# Patient Record
Sex: Female | Born: 1955 | Race: White | Hispanic: No | Smoking: Current every day smoker
Health system: Southern US, Community
[De-identification: ages and names within clinical notes are randomized; demographics above are authoritative.]

## PROBLEM LIST (undated history)

## (undated) ENCOUNTER — Telehealth
Attending: Student in an Organized Health Care Education/Training Program | Primary: Student in an Organized Health Care Education/Training Program

## (undated) ENCOUNTER — Ambulatory Visit: Payer: MEDICARE

## (undated) ENCOUNTER — Encounter
Attending: Student in an Organized Health Care Education/Training Program | Primary: Student in an Organized Health Care Education/Training Program

## (undated) ENCOUNTER — Ambulatory Visit

## (undated) ENCOUNTER — Encounter

## (undated) ENCOUNTER — Encounter: Attending: Registered" | Primary: Registered"

## (undated) ENCOUNTER — Telehealth

## (undated) ENCOUNTER — Ambulatory Visit
Payer: MEDICARE | Attending: Student in an Organized Health Care Education/Training Program | Primary: Student in an Organized Health Care Education/Training Program

## (undated) ENCOUNTER — Encounter: Attending: Oncology | Primary: Oncology

## (undated) ENCOUNTER — Ambulatory Visit: Payer: MEDICARE | Attending: Adult Health | Primary: Adult Health

## (undated) DIAGNOSIS — I671 Cerebral aneurysm, nonruptured: Secondary | ICD-10-CM

## (undated) DIAGNOSIS — F319 Bipolar disorder, unspecified: Secondary | ICD-10-CM

## (undated) DIAGNOSIS — R569 Unspecified convulsions: Secondary | ICD-10-CM

## (undated) DIAGNOSIS — J449 Chronic obstructive pulmonary disease, unspecified: Secondary | ICD-10-CM

## (undated) DIAGNOSIS — I10 Essential (primary) hypertension: Secondary | ICD-10-CM

## (undated) DIAGNOSIS — F32A Depression, unspecified: Secondary | ICD-10-CM

## (undated) DIAGNOSIS — M1990 Unspecified osteoarthritis, unspecified site: Secondary | ICD-10-CM

## (undated) DIAGNOSIS — F419 Anxiety disorder, unspecified: Secondary | ICD-10-CM

## (undated) DIAGNOSIS — R519 Headache, unspecified: Secondary | ICD-10-CM

## (undated) DIAGNOSIS — I499 Cardiac arrhythmia, unspecified: Secondary | ICD-10-CM

## (undated) DIAGNOSIS — F329 Major depressive disorder, single episode, unspecified: Secondary | ICD-10-CM

## (undated) HISTORY — PX: CEREBRAL ANEURYSM REPAIR: SHX164

## (undated) HISTORY — PX: ABDOMINAL HYSTERECTOMY: SHX81

## (undated) HISTORY — PX: BREAST SURGERY: SHX581

## (undated) HISTORY — PX: CARPAL TUNNEL RELEASE: SHX101

## (undated) HISTORY — PX: CARPECTOMY HAND: SUR194

## (undated) HISTORY — DX: Unspecified osteoarthritis, unspecified site: M19.90

## (undated) HISTORY — PX: TUBAL LIGATION: SHX77

## (undated) HISTORY — DX: Essential (primary) hypertension: I10

## (undated) HISTORY — PX: CARDIAC CATHETERIZATION: SHX172

## (undated) HISTORY — DX: Cerebral aneurysm, nonruptured: I67.1

## (undated) HISTORY — PX: CHOLECYSTECTOMY: SHX55

## (undated) MED ORDER — MELATONIN 10 MG TABLET: ORAL | 0 days

---

## 2014-04-08 LAB — TB SKIN TEST
INDURATION: 0 mm
TB SKIN TEST: NEGATIVE

## 2015-03-16 LAB — CARBAMAZEPINE, FREE, SERUM
CARBAMAZEPINE LVL: 5.3
Sodium, serum: 121

## 2015-04-16 LAB — TB SKIN TEST
INDURATION: 0 mm
TB SKIN TEST: NEGATIVE

## 2015-04-24 LAB — URINALYSIS, ROUTINE W REFLEX MICROSCOPIC
Bilirubin (Urine): NEGATIVE
CASTS: NONE SEEN
Glucose, Ur: NEGATIVE
Ketones, urine: NEGATIVE
Nitrite, UA: NEGATIVE — AB
OCCULT BLOOD: NEGATIVE
PH OF URINE: 7.5
PROTEIN, URINE: NEGATIVE
Specific Gravity, Urine: 1.017
Urobilinogen, Ur: 0.2

## 2015-04-24 LAB — HEPATIC FUNCTION PANEL
ALBUMIN SERUM: 4.6
ALK PHOS: 125
ALT: 17
AST: 19
BILIRUBIN, DIRECT: 0.12
BUN / CREAT RATIO: 14
BUN: 8
Bilirubin Total: 0.4
CALCIUM: 9.2
CHOLESTEROL, TOTAL: 221
Carbon Dioxide, Total: 22
Chloride, Serum: 88
Creatinine, Ser: 0.56
GFR CALC AF AMER: 118
GFR CALC NON AF AMER: 102
GLUCOSE: 103
HDL Cholesterol: 104
LDL CHOLESTEROL, CALC: 108
LDL/HDL Ratio: 1
POTASSIUM, SERUM: 4.7
Protein S, Total: 6.7
SODIUM, SERUM: 125
Triglycerides: 44
VLDL Cholesterol, Calc: 9

## 2015-05-21 LAB — CBC WITH DIFFERENTIAL/PLATELET
Basophil: 2
Basophils Absolute: 0
EOS (ABSOLUTE): 0.1
EOS%: 3 %
HEMATOCRIT: 33
Hemoglobin: 11.6
Immature Granulocytes: 0
LYMPHO ABS: 1 /uL
Lymphocytes: 39
MCH: 33.9
MCHC: 34.8
MCV: 97
MONOS PCT: 10
Monocytes Absolute: 0
NEUTRO ABS: 1 /uL
Neutrophils: 46
Platelets: 255
RBC: 3.42
RDW: 14.7
WBC: 2.8

## 2015-06-26 LAB — BASIC METABOLIC PANEL
BUN / CREAT RATIO: 11
BUN: 7
CREATININE: 0.65
Calcium, Ser: 8.9
Carbon Dioxide, Total: 22
Chloride, Serum: 91
EGFR (Non-African Amer.): 97
GFR CALC AF AMER: 112
Glucose: 85
Potassium, Ser: 4.2
Sodium, serum: 131

## 2015-08-07 LAB — CBC WITH DIFFERENTIAL/PLATELET
BASO(ABSOLUTE): 0.1
BASOS ABS: 2
EOS (ABSOLUTE): 0.1
EOS%: 2 %
HCT: 39
HEMOGLOBIN: 13.2
IMMATURE GRANULOCYTES: 0
Lymphocytes Absolute: 2 /uL
Lymphocytes: 44
MCH: 33.6
MCHC: 34.1
MCV: 99
MONO ABS: 1
Monocytes: 14
NEUTRO ABS: 1 /uL
NEUTROS PCT: 38
PLATELETS: 249
RBC: 3.93
RDW: 14.5
WBC: 3.5

## 2016-04-09 LAB — TB SKIN TEST
Induration: 0 mm
TB Skin Test: NEGATIVE

## 2016-05-26 LAB — BASIC METABOLIC PANEL
BUN / CREAT RATIO: 22
BUN: 12
CO2: 25
CREATINE, SERUM: 0.54
Calcium, Ser: 9.2
Chloride, Ser: 91
GFR CALC AF AMER: 118
GFR CALC NON AF AMER: 102
Glucose: 75
Potassium, serum: 4.9
Sodium, serum: 131

## 2016-08-20 LAB — ALT
ALT: 11
AST: 21
BASO(ABSOLUTE): 0.1
BUN / CREAT RATIO: 11
BUN: 7
Basophil: 2
Calcium, Ser: 9.6
Carbamazepine(Tegretol), S: 7
Carbon Dioxide, Total: 25
Chloride, Serum: 90
Creatine, Serum: 0.64
EGFR (Non-African Amer.): 97
EOS (ABSOLUTE): 0.1
EOS: 3 %
GFR CALC AF AMER: 111
Glucose: 80
HEMATOCRIT: 37
Hemoglobin: 12.6
Immature Granulocytes: 0
Lymphocytes Absolute: 1 /uL
Lymphocytes: 34
MCH: 33
MCHC: 34.1
MCV: 97
MONOCYTES(ABSOLUTE): 0.4
Monocytes: 11
Neutrophils Absolute: 2 /uL
Neutrophils: 50
POTASSIUM, SERUM: 4.5
Platelets: 278
RBC: 3.82
RDW: 14.4
SODIUM, SERUM: 129
WBC: 4.2

## 2016-09-05 LAB — HIV RAPID SCREEN (BLD OR BODY FLD EXPOSURE): HIV SCREEN 4TH GENERATION: NONREACTIVE

## 2016-12-19 LAB — COMP. METABOLIC PANEL (12)
ALBUMIN SERUM: 4.5
ALK PHOS: 117
AST: 15
Albumin/Globulin Ratio: 2
BASO(ABSOLUTE): 0.1
BILIRUBIN TOTAL: 0.3
BUN/Creatinine Ratio: 16
BUN: 11
Basophil: 2
CHLORIDE, SERUM: 94
CHOLESTEROL, TOTAL: 209
Calcium, Ser: 8.9
Creatinine, Ser: 0.67
EGFR (African American): 110
EOS (ABSOLUTE): 0.2
EOS: 4 %
GFR CALC NON AF AMER: 95
GLUCOSE: 81
Globulin, Total: 2.3
HDL Cholesterol: 97
HEMATOCRIT: 36
HEMOGLOBIN: 12.1
Hemoglobin A1C: 4.9
Immature Granulocytes: 0
LDL CALC: 93
LYMPHS PCT: 39
Lymphocytes Absolute: 1 /uL
MCH: 32.9
MCHC: 33.6
MCV: 98
MONOS PCT: 10
Monocytes Absolute: 0
NEUTROS PCT: 45
Neutrophils Absolute: 2 /uL
PLATELETS: 276
POTASSIUM, SERUM: 4.3
RBC: 3.68
RDW: 14.5
Sodium, serum: 135
TOTAL PROTEIN: 6.8 g/dL
TRIGLYCERIDES: 97
TSH: 3.5
VLDL: 19 mg/dL
WBC: 3.6

## 2017-04-11 LAB — TB SKIN TEST
Induration: 0 mm
TB Skin Test: NEGATIVE

## 2017-10-14 ENCOUNTER — Ambulatory Visit: Payer: Self-pay | Admitting: Pharmacy Technician

## 2017-10-14 DIAGNOSIS — Z79899 Other long term (current) drug therapy: Secondary | ICD-10-CM

## 2017-10-14 NOTE — Progress Notes (Signed)
  Completed [facility] application and contract.  Patient agreed to all terms of the [facility] contract.   Patient approved to receive medication assistance at [facility] through 2018, as long as eligibility criteria continues to be met.    Provided patient with community resource material based on her particular needs.    Referred patient for MTM.  [facility]

## 2017-11-03 ENCOUNTER — Encounter: Payer: Self-pay | Admitting: Internal Medicine

## 2017-11-03 ENCOUNTER — Ambulatory Visit: Payer: Self-pay | Admitting: Internal Medicine

## 2017-11-03 VITALS — BP 143/76 | HR 52 | Temp 97.6°F | Wt 185.5 lb

## 2017-11-03 DIAGNOSIS — I10 Essential (primary) hypertension: Secondary | ICD-10-CM

## 2017-11-03 DIAGNOSIS — R000 Tachycardia, unspecified: Secondary | ICD-10-CM

## 2017-11-03 MED ORDER — ATENOLOL 25 MG PO TABS: 25.0000 mg | ORAL_TABLET | Freq: Two times a day (BID) | ORAL | 3 refills | Status: DC

## 2017-11-03 MED ORDER — LISINOPRIL 20 MG PO TABS: 20.0000 mg | ORAL_TABLET | Freq: Every day | ORAL | 3 refills | Status: DC

## 2017-11-03 MED ORDER — AMLODIPINE BESYLATE 10 MG PO TABS: 10.0000 mg | ORAL_TABLET | Freq: Every day | ORAL | 3 refills | Status: DC

## 2017-11-03 NOTE — Progress Notes (Signed)
   Subjective:    Patient ID: Shannon Schroeder, female    DOB: Aug 02, 1956, 62 y.o.   MRN: [id number]  HPI   New pt with history of high blood pressure. Pt has previous diagnosis from [facility] of ventricular tachycardia, but it is controlled by medication.   Heart catheter done at [facility] about 12 years ago. Heart does not empty all of the way, suggesting systolic dysfunction. Records have not been reviewed at this time.   Allergies as of 11/03/2017      Reactions   Aspirin Rash   Naproxen Swelling   rash   Paxil [paroxetine Hcl] Rash   Phenobarbital Rash   Prozac [fluoxetine Hcl] Rash      Medication List        Accurate as of 11/03/17  9:48 AM. Always use your most recent med list.          amLODipine 10 MG tablet Commonly known as:  NORVASC Take 10 mg by mouth daily.   atenolol 25 MG tablet Commonly known as:  TENORMIN Take by mouth daily.   carbamazepine 200 MG tablet Commonly known as:  TEGRETOL Take 600 mg by mouth daily.   LAMICTAL 100 MG tablet Generic drug:  lamoTRIgine Take 125 mg by mouth daily.   lisinopril 20 MG tablet Commonly known as:  PRINIVIL,ZESTRIL Take 20 mg by mouth daily.      There are no active problems to display for this patient.    Review of Systems     Objective:   Physical Exam  Constitutional: She is oriented to person, place, and time.  Cardiovascular: Normal rate, regular rhythm and normal heart sounds.  Pulmonary/Chest: Effort normal and breath sounds normal.  Neurological: She is alert and oriented to person, place, and time.    BP (!) 143/76   Pulse (!) 52   Temp 97.6 F (36.4 C) (Oral)   Wt 185 lb 8 oz (84.1 kg)         Assessment & Plan:   Labs ordered today: CMET, CBC, lipid panel, TSH, UA, E72 level, folic acid  Order EKG at [facility] for tachycardia evaluation  Referral for eye exam at [facility].  F/u in 3 months to review lab results.

## 2017-11-04 ENCOUNTER — Encounter: Payer: Self-pay | Admitting: Pharmacist

## 2017-11-04 ENCOUNTER — Other Ambulatory Visit: Payer: Self-pay

## 2017-11-04 ENCOUNTER — Ambulatory Visit: Payer: Self-pay | Admitting: Pharmacist

## 2017-11-04 VITALS — BP 125/75 | Ht 67.0 in | Wt 184.0 lb

## 2017-11-04 DIAGNOSIS — Z79899 Other long term (current) drug therapy: Secondary | ICD-10-CM

## 2017-11-04 LAB — COMPREHENSIVE METABOLIC PANEL
A/G RATIO: 2 (ref 1.2–2.2)
ALBUMIN: 4.4 g/dL (ref 3.6–4.8)
ALK PHOS: 107 IU/L (ref 39–117)
ALT: 11 IU/L (ref 0–32)
AST: 17 IU/L (ref 0–40)
BILIRUBIN TOTAL: 0.3 mg/dL (ref 0.0–1.2)
BUN / CREAT RATIO: 16 (ref 12–28)
BUN: 9 mg/dL (ref 8–27)
CHLORIDE: 91 mmol/L — AB (ref 96–106)
CO2: 24 mmol/L (ref 20–29)
Calcium: 9 mg/dL (ref 8.7–10.3)
Creatinine, Ser: 0.57 mg/dL (ref 0.57–1.00)
GFR calc Af Amer: 115 mL/min/{1.73_m2} (ref >59)
GFR calc non Af Amer: 100 mL/min/{1.73_m2} (ref >59)
GLOBULIN, TOTAL: 2.2 g/dL (ref 1.5–4.5)
Glucose: 80 mg/dL (ref 65–99)
POTASSIUM: 5.2 mmol/L (ref 3.5–5.2)
SODIUM: 129 mmol/L — AB (ref 134–144)
Total Protein: 6.6 g/dL (ref 6.0–8.5)

## 2017-11-04 LAB — URINALYSIS
Bilirubin, UA: NEGATIVE
GLUCOSE, UA: NEGATIVE
Ketones, UA: NEGATIVE
Nitrite, UA: NEGATIVE
PROTEIN UA: NEGATIVE
RBC, UA: NEGATIVE
Specific Gravity, UA: 1.02 (ref 1.005–1.030)
Urobilinogen, Ur: 0.2 mg/dL (ref 0.2–1.0)
pH, UA: 8.5 — ABNORMAL HIGH (ref 5.0–7.5)

## 2017-11-04 LAB — CBC
Hematocrit: 33.7 % — ABNORMAL LOW (ref 34.0–46.6)
Hemoglobin: 11.6 g/dL (ref 11.1–15.9)
MCH: 33.6 pg — ABNORMAL HIGH (ref 26.6–33.0)
MCHC: 34.4 g/dL (ref 31.5–35.7)
MCV: 98 fL — AB (ref 79–97)
PLATELETS: 272 10*3/uL (ref 150–379)
RBC: 3.45 x10E6/uL — AB (ref 3.77–5.28)
RDW: 14.5 % (ref 12.3–15.4)
WBC: 3.2 10*3/uL — AB (ref 3.4–10.8)

## 2017-11-04 LAB — TSH: TSH: 3.33 u[IU]/mL (ref 0.450–4.500)

## 2017-11-04 LAB — LIPID PANEL
CHOLESTEROL TOTAL: 244 mg/dL — AB (ref 100–199)
Chol/HDL Ratio: 1.9 ratio (ref 0.0–4.4)
HDL: 128 mg/dL (ref >39)
LDL Calculated: 107 mg/dL — ABNORMAL HIGH (ref 0–99)
TRIGLYCERIDES: 43 mg/dL (ref 0–149)
VLDL CHOLESTEROL CAL: 9 mg/dL (ref 5–40)

## 2017-11-04 LAB — FOLATE: Folate: 4 ng/mL (ref >3.0)

## 2017-11-04 LAB — VITAMIN B12: Vitamin B-12: 299 pg/mL (ref 232–1245)

## 2017-11-04 NOTE — Progress Notes (Signed)
  [facility] Visit Note  Patient: Shannon Schroeder MRN: [id number] Date of Birth: 25-May-1956 PCP: Tawni Millers, MD   Shannon Schroeder 62 y.o. female presents for an initial MTM visit today. Primary objective of today's visit is to reconcile medications.  BP 125/75 (BP Location: Right Arm, Patient Position: Sitting, Cuff Size: Normal)   Ht 5\' 7"  (1.702 m)   Wt 184 lb (83.5 kg)   BMI 28.82 kg/m   Patient Information   Past Medical History:  Diagnosis Date  . Arthritis   . Hypertension       Past Surgical History:  Procedure Laterality Date  . ABDOMINAL HYSTERECTOMY     partial  . BREAST SURGERY     biopsy  . CARPECTOMY HAND Right   . CHOLECYSTECTOMY       Family History  Problem Relation Age of Onset  . Hypertension Mother   . Heart failure Mother   . Hypertension Brother   . Stroke Brother   . Hypertension Son   . Emphysema Maternal Aunt   . Hypertension Paternal Aunt   . Hypertension Paternal Uncle   . Hypertension Maternal Grandmother   . Hypertension Paternal Grandmother     New Diagnoses (since last visit):   Family Support: [place]   Social History   Substance and Sexual Activity  Alcohol Use No  . Frequency: Never      Social History   Tobacco Use  Smoking Status Never Smoker  Smokeless Tobacco Never Used      Health Maintenance  Topic Date Due  . Hepatitis C Screening  12-Jul-1956  . HIV Screening  05/03/1970  . TETANUS/TDAP  05/03/1974  . PAP SMEAR  05/03/1976  . MAMMOGRAM  05/03/2005  . COLONOSCOPY  05/03/2005  . INFLUENZA VACCINE  06/02/2017   Outpatient Encounter Medications as of 11/04/2017  Medication Sig  . amLODipine (NORVASC) 10 MG tablet Take 1 tablet (10 mg total) by mouth daily.  Marland Kitchen atenolol (TENORMIN) 25 MG tablet Take 1 tablet (25 mg total) by mouth 2 (two) times daily.  . carbamazepine (TEGRETOL) 200 MG tablet Take 600 mg by mouth daily.  Marland Kitchen lamoTRIgine (LAMICTAL) 100 MG tablet  Take 125 mg by mouth daily.  Marland Kitchen lisinopril (PRINIVIL,ZESTRIL) 20 MG tablet Take 1 tablet (20 mg total) by mouth daily.   No facility-administered encounter medications on file as of 11/04/2017.     Assessment and Plan:  Compliance/Adherance: pt knows 3W's of medications without any prompting, never misses doses. Pt is about to run out of mental health meds. She saw Dr. Jamse Arn yesterday but MD did not have her medical records. She has a bottle from a previous fill which I requested she bring here so that we can possibly transfer the rx here to fill so that she will not experience a gap in her medications.  HBP: today 125/75 within goal of <140/90. Pt doing well on amlodipine, lisinopril, and atenolol.  Bipolar/depression: She states that the depressive cycles are worse and that she rarely ever has manic episodes. pt is on lamictal and carbamazepine.  Pt states she has been stable on this combination for a long time. Working on getting rx's as per above.  Netta Neat, PharmD, [facility]) [phone]

## 2017-11-24 ENCOUNTER — Ambulatory Visit: Payer: Self-pay | Admitting: Ophthalmology

## 2017-11-24 DIAGNOSIS — I10 Essential (primary) hypertension: Secondary | ICD-10-CM

## 2017-12-01 ENCOUNTER — Ambulatory Visit: Payer: Self-pay | Admitting: Internal Medicine

## 2017-12-01 ENCOUNTER — Encounter: Payer: Self-pay | Admitting: Internal Medicine

## 2017-12-01 VITALS — BP 178/89 | HR 64 | Temp 98.1°F | Wt 183.6 lb

## 2017-12-01 DIAGNOSIS — E878 Other disorders of electrolyte and fluid balance, not elsewhere classified: Secondary | ICD-10-CM

## 2017-12-01 NOTE — Progress Notes (Signed)
   Subjective:    Patient ID: Shannon Schroeder, female    DOB: 03/06/1956, 62 y.o.   MRN: [id number]  HPI  Pt is here for a f/u on lab results. She has a low level of sodium due hyponitremia.  She states that she has a previous diagnosis of hyponitremia. She gets muscle cramps and feels out of it.     Allergies as of 12/01/2017      Reactions   Aspirin Swelling   Naproxen Swelling   rash   Paxil [paroxetine Hcl] Rash   Phenobarbital Rash   Prozac [fluoxetine Hcl] Rash      Medication List        Accurate as of 12/01/17  9:23 AM. Always use your most recent med list.          amLODipine 10 MG tablet Commonly known as:  NORVASC Take 1 tablet (10 mg total) by mouth daily.   atenolol 25 MG tablet Commonly known as:  TENORMIN Take 1 tablet (25 mg total) by mouth 2 (two) times daily.   carbamazepine 200 MG tablet Commonly known as:  TEGRETOL Take 600 mg by mouth daily.   LAMICTAL 100 MG tablet Generic drug:  lamoTRIgine Take 125 mg by mouth daily.   lisinopril 20 MG tablet Commonly known as:  PRINIVIL,ZESTRIL Take 1 tablet (20 mg total) by mouth daily.      There are no active problems to display for this patient.    Review of Systems     Objective:   Physical Exam  Constitutional: She is oriented to person, place, and time.  Cardiovascular: Normal rate, regular rhythm and normal heart sounds.  Pulmonary/Chest: Effort normal and breath sounds normal.  Neurological: She is alert and oriented to person, place, and time.    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 Low level of sodium on lab test is a result of this disorder.  Pt's previous records are not currently in the system for review.

## 2017-12-07 ENCOUNTER — Ambulatory Visit
Admission: RE | Admit: 2017-12-07 | Discharge: 2017-12-07 | Disposition: A | Discharge status: Home / Self Care | Payer: Self-pay | Source: Ambulatory Visit | Attending: Internal Medicine | Admitting: Internal Medicine

## 2018-02-02 ENCOUNTER — Ambulatory Visit: Payer: Self-pay | Admitting: Internal Medicine

## 2018-02-02 ENCOUNTER — Encounter: Payer: Self-pay | Admitting: Internal Medicine

## 2018-02-02 DIAGNOSIS — R000 Tachycardia, unspecified: Secondary | ICD-10-CM | POA: Insufficient documentation

## 2018-02-02 DIAGNOSIS — E871 Hypo-osmolality and hyponatremia: Secondary | ICD-10-CM | POA: Insufficient documentation

## 2018-02-02 DIAGNOSIS — R21 Rash and other nonspecific skin eruption: Secondary | ICD-10-CM | POA: Insufficient documentation

## 2018-02-02 MED ORDER — MICONAZOLE NITRATE 2 % EX CREA: 1.0000 "application " | TOPICAL_CREAM | Freq: Two times a day (BID) | CUTANEOUS | 0 refills | Status: DC

## 2018-02-02 NOTE — Progress Notes (Signed)
   Subjective:    Patient ID: Shannon Schroeder, female    DOB: [birth year], 62 y.o.   MRN: [id number]  HPI BP (!) 160/77   Pulse 81   Temp 98.1 F (36.7 C) (Oral)   Wt 178 lb 8 oz (81 kg)   BMI 27.96 kg/m   Allergies as of 02/02/2018      Reactions   Aspirin Swelling   Naproxen Swelling   rash   Paxil [paroxetine Hcl] Rash   Phenobarbital Rash   Prozac [fluoxetine Hcl] Rash      Medication List        Accurate as of 02/02/18  9:24 AM. Always use your most recent med list.          amLODipine 10 MG tablet Commonly known as:  NORVASC Take 1 tablet (10 mg total) by mouth daily.   atenolol 25 MG tablet Commonly known as:  TENORMIN Take 1 tablet (25 mg total) by mouth 2 (two) times daily.   carbamazepine 200 MG tablet Commonly known as:  TEGRETOL Take 600 mg by mouth daily.   LAMICTAL 100 MG tablet Generic drug:  lamoTRIgine Take 125 mg by mouth daily.   lisinopril 20 MG tablet Commonly known as:  PRINIVIL,ZESTRIL Take 1 tablet (20 mg total) by mouth daily.       Pt here for a rash on legs and buttocks that itches. Lesions on buttocks. Pt has tried hydrocortisone and eucerin cream. Nothing has seemed to help. Pt stated she has had two cardiac caths and they found a systolic issue. Pt stated she has ventricluar tachycardia.   Review of Systems There are no active problems to display for this patient.      Objective:   Physical Exam  Constitutional: She is oriented to person, place, and time.  Cardiovascular: Normal rate and regular rhythm.  Pulmonary/Chest: Effort normal and breath sounds normal.  Neurological: She is alert and oriented to person, place, and time.          Assessment & Plan:  Reviewed EKG abnormal with ST Q wave findings. No previous record of EKG. HX of cardiac caths x 2.  Miconazole cream rx sent to pharmacy. Rash could be eczema and is not lotions and such are not keeping the skin moisturized. Pt should not shower everyday. PT  verbalized understanding.  RTC 3 months labs week prior met c, cbc, serum iron and ferritin.  Will be looking for records from [place].

## 2018-03-02 ENCOUNTER — Other Ambulatory Visit: Payer: Self-pay

## 2018-03-02 ENCOUNTER — Encounter: Payer: Self-pay | Admitting: *Deleted

## 2018-03-08 ENCOUNTER — Encounter
Admission: RE | Disposition: A | Discharge status: Home / Self Care | Payer: Self-pay | Source: Ambulatory Visit | Attending: Ophthalmology

## 2018-03-08 ENCOUNTER — Encounter: Payer: Self-pay | Admitting: Anesthesiology

## 2018-03-08 ENCOUNTER — Ambulatory Visit
Admission: RE | Admit: 2018-03-08 | Discharge: 2018-03-08 | Disposition: A | Discharge status: Home / Self Care | Source: Ambulatory Visit | Attending: Ophthalmology | Admitting: Ophthalmology

## 2018-03-08 ENCOUNTER — Ambulatory Visit: Admitting: Anesthesiology

## 2018-03-08 ENCOUNTER — Other Ambulatory Visit: Payer: Self-pay

## 2018-03-08 DIAGNOSIS — H2512 Age-related nuclear cataract, left eye: Secondary | ICD-10-CM | POA: Diagnosis present

## 2018-03-08 DIAGNOSIS — M1990 Unspecified osteoarthritis, unspecified site: Secondary | ICD-10-CM | POA: Insufficient documentation

## 2018-03-08 DIAGNOSIS — Z79899 Other long term (current) drug therapy: Secondary | ICD-10-CM | POA: Diagnosis not present

## 2018-03-08 DIAGNOSIS — I10 Essential (primary) hypertension: Secondary | ICD-10-CM | POA: Insufficient documentation

## 2018-03-08 DIAGNOSIS — F319 Bipolar disorder, unspecified: Secondary | ICD-10-CM | POA: Insufficient documentation

## 2018-03-08 DIAGNOSIS — I472 Ventricular tachycardia: Secondary | ICD-10-CM | POA: Insufficient documentation

## 2018-03-08 DIAGNOSIS — F419 Anxiety disorder, unspecified: Secondary | ICD-10-CM | POA: Insufficient documentation

## 2018-03-08 DIAGNOSIS — F17200 Nicotine dependence, unspecified, uncomplicated: Secondary | ICD-10-CM | POA: Diagnosis not present

## 2018-03-08 DIAGNOSIS — Z886 Allergy status to analgesic agent status: Secondary | ICD-10-CM | POA: Diagnosis not present

## 2018-03-08 DIAGNOSIS — Z888 Allergy status to other drugs, medicaments and biological substances status: Secondary | ICD-10-CM | POA: Insufficient documentation

## 2018-03-08 HISTORY — DX: Depression, unspecified: F32.A

## 2018-03-08 HISTORY — DX: Cardiac arrhythmia, unspecified: I49.9

## 2018-03-08 HISTORY — DX: Major depressive disorder, single episode, unspecified: F32.9

## 2018-03-08 HISTORY — PX: CATARACT EXTRACTION W/PHACO: SHX586

## 2018-03-08 HISTORY — DX: Bipolar disorder, unspecified: F31.9

## 2018-03-08 HISTORY — DX: Anxiety disorder, unspecified: F41.9

## 2018-03-08 SURGERY — PHACOEMULSIFICATION, CATARACT, WITH IOL INSERTION
Anesthesia: Monitor Anesthesia Care | Site: Eye | Laterality: Left | Wound class: "Clean "

## 2018-03-08 MED ORDER — ARMC OPHTHALMIC DILATING DROPS
1.0000 "application " | OPHTHALMIC | Status: AC
  Administered 2018-03-08 (×3): 1 via OPHTHALMIC

## 2018-03-08 MED ORDER — MOXIFLOXACIN HCL 0.5 % OP SOLN: 1.0000 [drp] | OPHTHALMIC | Status: DC | PRN

## 2018-03-08 MED ORDER — CARBACHOL 0.01 % IO SOLN
INTRAOCULAR | Status: DC | PRN
  Administered 2018-03-08: .5 mL via INTRAOCULAR

## 2018-03-08 MED ORDER — BSS IO SOLN
INTRAOCULAR | Status: DC | PRN
  Administered 2018-03-08: 1 mL via OPHTHALMIC

## 2018-03-08 MED ORDER — MIDAZOLAM HCL 2 MG/2ML IJ SOLN
INTRAMUSCULAR | Status: AC
Start: 2018-03-08 — End: ?
  Filled 2018-03-08: qty 2

## 2018-03-08 MED ORDER — BSS IO SOLN
INTRAOCULAR | Status: DC | PRN
  Administered 2018-03-08: 2 mL via OPHTHALMIC

## 2018-03-08 MED ORDER — EPINEPHRINE PF 1 MG/ML IJ SOLN
INTRAMUSCULAR | Status: AC
  Filled 2018-03-08: qty 1

## 2018-03-08 MED ORDER — SODIUM CHLORIDE 0.9 % IV SOLN
INTRAVENOUS | Status: DC
  Administered 2018-03-08: 10:00:00 via INTRAVENOUS

## 2018-03-08 MED ORDER — POVIDONE-IODINE 5 % OP SOLN
OPHTHALMIC | Status: AC
  Filled 2018-03-08: qty 30

## 2018-03-08 MED ORDER — ARMC OPHTHALMIC DILATING DROPS
OPHTHALMIC | Status: AC
  Administered 2018-03-08: 1 via OPHTHALMIC
  Filled 2018-03-08: qty 0.4

## 2018-03-08 MED ORDER — MIDAZOLAM HCL 2 MG/2ML IJ SOLN
INTRAMUSCULAR | Status: DC | PRN
  Administered 2018-03-08: 1 mg via INTRAVENOUS

## 2018-03-08 MED ORDER — LIDOCAINE HCL (PF) 4 % IJ SOLN
INTRAMUSCULAR | Status: AC
  Filled 2018-03-08: qty 5

## 2018-03-08 MED ORDER — MOXIFLOXACIN HCL 0.5 % OP SOLN
OPHTHALMIC | Status: AC
  Filled 2018-03-08: qty 3

## 2018-03-08 MED ORDER — POVIDONE-IODINE 5 % OP SOLN
OPHTHALMIC | Status: DC | PRN
  Administered 2018-03-08: 1 via OPHTHALMIC

## 2018-03-08 MED ORDER — MOXIFLOXACIN HCL 0.5 % OP SOLN
OPHTHALMIC | Status: DC | PRN
  Administered 2018-03-08: .2 mL via OPHTHALMIC

## 2018-03-08 MED ORDER — NA CHONDROIT SULF-NA HYALURON 40-17 MG/ML IO SOLN
INTRAOCULAR | Status: AC
  Filled 2018-03-08: qty 1

## 2018-03-08 MED ORDER — NA CHONDROIT SULF-NA HYALURON 40-17 MG/ML IO SOLN
INTRAOCULAR | Status: DC | PRN
  Administered 2018-03-08: 1 mL via INTRAOCULAR

## 2018-03-08 MED ORDER — PROPOFOL 10 MG/ML IV BOLUS
INTRAVENOUS | Status: AC
  Filled 2018-03-08: qty 20

## 2018-03-08 SURGICAL SUPPLY — 16 items
GLOVE BIO SURGEON STRL SZ8 (GLOVE) ×3 IMPLANT
GLOVE BIOGEL M 6.5 STRL (GLOVE) ×3 IMPLANT
GLOVE SURG LX 8.0 MICRO (GLOVE) ×2
GLOVE SURG LX STRL 8.0 MICRO (GLOVE) ×1 IMPLANT
GOWN STRL REUS W/ TWL LRG LVL3 (GOWN DISPOSABLE) ×2 IMPLANT
GOWN STRL REUS W/TWL LRG LVL3 (GOWN DISPOSABLE) ×4
LABEL CATARACT MEDS ST (LABEL) ×3 IMPLANT
LENS IOL TECNIS ITEC 23.0 (Intraocular Lens) ×2 IMPLANT
PACK CATARACT (MISCELLANEOUS) ×3 IMPLANT
PACK CATARACT BRASINGTON LX (MISCELLANEOUS) ×3 IMPLANT
PACK EYE AFTER SURG (MISCELLANEOUS) ×3 IMPLANT
SOL BSS BAG (MISCELLANEOUS) ×3
SOLUTION BSS BAG (MISCELLANEOUS) ×1 IMPLANT
SYR 5ML LL (SYRINGE) ×3 IMPLANT
WATER STERILE IRR 250ML POUR (IV SOLUTION) ×3 IMPLANT
WIPE NON LINTING 3.25X3.25 (MISCELLANEOUS) ×3 IMPLANT

## 2018-03-08 NOTE — Anesthesia Post-op Follow-up Note (Signed)
Anesthesia QCDR form completed.        

## 2018-03-08 NOTE — Op Note (Signed)
PREOPERATIVE DIAGNOSIS:  Nuclear sclerotic cataract of the left eye.   POSTOPERATIVE DIAGNOSIS:  Nuclear sclerotic cataract of the left eye.   OPERATIVE PROCEDURE: Procedure(s): CATARACT EXTRACTION PHACO AND INTRAOCULAR LENS PLACEMENT (IOC)   SURGEON:  Birder Robson, MD.   ANESTHESIA:  Anesthesiologist: Gunnar Bulla, MD CRNA: Hedda Slade, CRNA Student Nurse Anesthetist: [name], RN  1.      Managed anesthesia care. 2.     0.88ml of Shugarcaine was instilled following the paracentesis   COMPLICATIONS:  None.   TECHNIQUE:   Stop and chop   DESCRIPTION OF PROCEDURE:  The patient was examined and consented in the preoperative holding area where the aforementioned topical anesthesia was applied to the left eye and then brought back to the Operating Room where the left eye was prepped and draped in the usual sterile ophthalmic fashion and a lid speculum was placed. A paracentesis was created with the side port blade and the anterior chamber was filled with viscoelastic. A near clear corneal incision was performed with the steel keratome. A continuous curvilinear capsulorrhexis was performed with a cystotome followed by the capsulorrhexis forceps. Hydrodissection and hydrodelineation were carried out with BSS on a blunt cannula. The lens was removed in a stop and chop  technique and the remaining cortical material was removed with the irrigation-aspiration handpiece. The capsular bag was inflated with viscoelastic and the Technis [id number] lens was placed in the capsular bag without complication. The remaining viscoelastic was removed from the eye with the irrigation-aspiration handpiece. The wounds were hydrated. The anterior chamber was flushed with Miostat and the eye was inflated to physiologic pressure. 0.56ml Vigamox was placed in the anterior chamber. The wounds were found to be water tight. The eye was dressed with Vigamox. The patient was given protective glasses to wear throughout the  day and a shield with which to sleep tonight. The patient was also given drops with which to begin a drop regimen today and will follow-up with me in one day. Implant Name Type Inv. Item Serial No. Manufacturer Lot No. LRB No. Used  LENS IOL DIOP 23.0 - [id number] Intraocular Lens LENS IOL DIOP 23.0 [id number] AMO  Left 1    Procedure(s) with comments: CATARACT EXTRACTION PHACO AND INTRAOCULAR LENS PLACEMENT (IOC) (Left) - [place] 00:55 AP% 13.5 CDE 7.52 Fluid pack lot # [id number]  Electronically signed: Birder Robson 03/08/2018 11:26 AM

## 2018-03-08 NOTE — Discharge Instructions (Signed)
Eye Surgery Discharge Instructions  Expect mild scratchy sensation or mild soreness. DO NOT RUB YOUR EYE!  The day of surgery:  Minimal physical activity, but bed rest is not required  No reading, computer work, or close hand work  No bending, lifting, or straining.  May watch TV  For 24 hours:  No driving, legal decisions, or alcoholic beverages  Safety precautions  Eat anything you prefer: It is better to start with liquids, then soup then solid foods.  _____ Eye patch should be worn until postoperative exam tomorrow.  ____ Solar shield eyeglasses should be worn for comfort in the sunlight/patch while sleeping  Resume all regular medications including aspirin or Coumadin if these were discontinued prior to surgery. You may shower, bathe, shave, or wash your hair. Tylenol may be taken for mild discomfort.  Call your doctor if you experience significant pain, nausea, or vomiting, fever > 101 or other signs of infection. [phone] or [phone] Specific instructions:  Follow-up Information    Birder Robson, MD Follow up.   Specialty:  Ophthalmology Why:  May 8 at 10:00am Contact information: [address] [place] [address] [phone]

## 2018-03-08 NOTE — Transfer of Care (Signed)
Immediate Anesthesia Transfer of Care Note  Patient: Shannon Schroeder  Procedure(s) Performed: CATARACT EXTRACTION PHACO AND INTRAOCULAR LENS PLACEMENT (IOC) (Left Eye)  Patient Location: PACU  Anesthesia Type:MAC  Level of Consciousness: awake, alert  and oriented  Airway & Oxygen Therapy: Patient Spontanous Breathing  Post-op Assessment: Report given to RN and Post -op Vital signs reviewed and stable  Post vital signs: Reviewed and stable  Last Vitals:  Vitals Value Taken Time  BP    Temp    Pulse    Resp    SpO2      Last Pain:  Vitals:   03/08/18 1007  TempSrc: Temporal         Complications: No apparent anesthesia complications

## 2018-03-08 NOTE — Anesthesia Preprocedure Evaluation (Signed)
Anesthesia Evaluation  Patient identified by MRN, date of birth, ID band Patient awake    Reviewed: Allergy & Precautions, NPO status , Patient's Chart, lab work & pertinent test results, reviewed documented beta blocker date and time   Airway Mallampati: II  TM Distance: >3 FB     Dental  (+) Chipped   Pulmonary Current Smoker,           Cardiovascular hypertension, Pt. on medications and Pt. on home beta blockers + dysrhythmias Ventricular Tachycardia      Neuro/Psych PSYCHIATRIC DISORDERS Anxiety Depression Bipolar Disorder    GI/Hepatic   Endo/Other    Renal/GU      Musculoskeletal  (+) Arthritis ,   Abdominal   Peds  Hematology   Anesthesia Other Findings   Reproductive/Obstetrics                             Anesthesia Physical Anesthesia Plan  ASA: III  Anesthesia Plan: MAC   Post-op Pain Management:    Induction:   PONV Risk Score and Plan:   Airway Management Planned:   Additional Equipment:   Intra-op Plan:   Post-operative Plan:   Informed Consent: I have reviewed the patients History and Physical, chart, labs and discussed the procedure including the risks, benefits and alternatives for the proposed anesthesia with the patient or authorized representative who has indicated his/her understanding and acceptance.     Plan Discussed with: CRNA  Anesthesia Plan Comments:         Anesthesia Quick Evaluation

## 2018-03-08 NOTE — H&P (Signed)
All labs reviewed. Abnormal studies sent to patients PCP when indicated.  Previous H&P reviewed, patient examined, there are NO CHANGES.  Shannon Wawrzyniak Porfilio5/7/201911:01 AM

## 2018-03-08 NOTE — Anesthesia Postprocedure Evaluation (Signed)
Anesthesia Post Note  Patient: Shannon Schroeder  Procedure(s) Performed: CATARACT EXTRACTION PHACO AND INTRAOCULAR LENS PLACEMENT ([place]) (Left Eye)  Patient location during evaluation: PACU Anesthesia Type: MAC Level of consciousness: awake Pain management: pain level controlled Vital Signs Assessment: post-procedure vital signs reviewed and stable Respiratory status: spontaneous breathing Cardiovascular status: blood pressure returned to baseline Postop Assessment: no apparent nausea or vomiting Anesthetic complications: no     Last Vitals:  Vitals:   03/08/18 1007  BP: (!) 148/71  Pulse: 62  Resp: 16  Temp: (!) 36.3 C  SpO2: 100%    Last Pain:  Vitals:   03/08/18 1007  TempSrc: Temporal                 [name]

## 2018-03-21 ENCOUNTER — Encounter: Payer: Self-pay | Admitting: *Deleted

## 2018-03-31 ENCOUNTER — Ambulatory Visit: Payer: Self-pay | Admitting: Adult Health Nurse Practitioner

## 2018-03-31 VITALS — BP 175/94 | HR 79 | Temp 98.4°F | Wt 171.6 lb

## 2018-03-31 DIAGNOSIS — I10 Essential (primary) hypertension: Secondary | ICD-10-CM | POA: Insufficient documentation

## 2018-03-31 DIAGNOSIS — F3160 Bipolar disorder, current episode mixed, unspecified: Secondary | ICD-10-CM

## 2018-03-31 DIAGNOSIS — J069 Acute upper respiratory infection, unspecified: Secondary | ICD-10-CM | POA: Insufficient documentation

## 2018-03-31 MED ORDER — ALBUTEROL SULFATE HFA 108 (90 BASE) MCG/ACT IN AERS: 1.0000 | INHALATION_SPRAY | Freq: Four times a day (QID) | RESPIRATORY_TRACT | 2 refills | Status: DC | PRN

## 2018-03-31 MED ORDER — LISINOPRIL 40 MG PO TABS: 40.0000 mg | ORAL_TABLET | Freq: Every day | ORAL | 3 refills | Status: DC

## 2018-03-31 MED ORDER — AZITHROMYCIN 250 MG PO TABS: ORAL_TABLET | ORAL | 0 refills | Status: DC

## 2018-03-31 NOTE — Progress Notes (Signed)
Patient: Shannon Schroeder Female    DOB: 1956/09/27   62 y.o.   MRN: [id number] Visit Date: 03/31/2018  Today's Provider: Staci Acosta, NP   Chief Complaint  Patient presents with  . Fatigue    Previously had cold and has remaining cough. She suddenly experiences exhaustion randomly.    Subjective:    HPI  Pt states she started coughing two weeks ago, runny nose.  States that cough is "deeper" with yellow-green sputum- same coming from nostrils.  Denies fever, chills, N/V/D.  Taking OTC benadryl with minimal relief.  Has taken nyquil with minimal review.  Pt is a smoker x a few months, she previously quit for 9 years prior to that smoked 35 years.   Pt states that she loses her energy intermittently when working or at rest.   She needs a tegretol level for RHA.   BP increased at last visit 160/77.    Allergies  Allergen Reactions  . Aspirin Swelling  . Naproxen Swelling and Rash  . Paxil [Paroxetine Hcl] Rash  . Phenobarbital Rash  . Prozac [Fluoxetine Hcl] Rash   Previous Medications   ACETAMINOPHEN (TYLENOL) 500 MG TABLET    Take 500-1,000 mg by mouth every 6 (six) hours as needed for moderate pain or headache.   AMLODIPINE (NORVASC) 10 MG TABLET    Take 1 tablet (10 mg total) by mouth daily.   ATENOLOL (TENORMIN) 25 MG TABLET    Take 1 tablet (25 mg total) by mouth 2 (two) times daily.   BREXPIPRAZOLE (REXULTI) 1 MG TABS    Take 1 mg by mouth at bedtime.   CARBAMAZEPINE (TEGRETOL) 200 MG TABLET    Take 600 mg by mouth at bedtime.    DIFLUPREDNATE (DUREZOL OP)    Place 1 drop into the left eye 2 (two) times daily.   DIPHENHYDRAMINE (BENADRYL) 25 MG TABLET    Take 25 mg by mouth 2 (two) times daily.   IBUPROFEN (ADVIL,MOTRIN) 200 MG TABLET    Take 400 mg by mouth every 6 (six) hours as needed for headache or moderate pain.   LAMOTRIGINE (LAMICTAL) 100 MG TABLET    Take 125 mg by mouth daily.   LISINOPRIL (PRINIVIL,ZESTRIL) 20 MG TABLET    Take 1 tablet (20 mg  total) by mouth daily.   MICONAZOLE (MICOTIN) 2 % CREAM    Apply 1 application topically 2 (two) times daily.   PSEUDOEPH-DOXYLAMINE-DM-APAP (NYQUIL PO)    Take 15 mLs by mouth at bedtime as needed (cold).     Review of Systems  Constitutional: Negative for chills and fever.  Respiratory: Positive for cough and shortness of breath.     Social History   Tobacco Use  . Smoking status: Current Every Day Smoker    Packs/day: 1.00    Types: Cigarettes  . Smokeless tobacco: Never Used  Substance Use Topics  . Alcohol use: No    Frequency: Never   Objective:   BP (!) 175/94   Pulse 79   Temp 98.4 F (36.9 C)   Wt 171 lb 9.6 oz (77.8 kg)   BMI 26.09 kg/m   Physical Exam  Constitutional: She appears well-developed and well-nourished.  Cardiovascular: Normal rate, regular rhythm and normal heart sounds.  Pulmonary/Chest: Effort normal. She has wheezes.  Abdominal: Bowel sounds are normal.        Assessment & Plan:         HTN:   Not controlled.  Goal BP <140/90.  Continue Norvasc and  Atenolol, increase Lisinopril to 40mg  daily.  Encourage low salt diet and exercise.   Z-pack Albuterol inhaler PRN.  Zyrtec nightly x 14 nights.  Increase PO fluid intake.  Suspect COPD- if symptoms continue try steroid taper and/or maintenance therapy.   Work note for light duty tomorrow- resume regular duties on Monday.   Staci Acosta, NP   [facility]

## 2018-04-01 ENCOUNTER — Telehealth: Payer: Self-pay | Admitting: Pharmacist

## 2018-04-01 NOTE — Telephone Encounter (Signed)
04/01/2018 12:15:19 PM - Ventolin HFA New Med  04/01/18 Received a pharmacy printout for New Med-Ventolin HFA 32mcg Inhale 1-2 puffs every 6 hours as needed for wheezing or shortness of breath. Printed [employer] application-mailing patient her portion to sign & return, also printed script-sending to [facility] for Teah to sign.Delos Haring

## 2018-04-02 LAB — SPECIMEN STATUS REPORT

## 2018-04-05 ENCOUNTER — Ambulatory Visit
Admission: RE | Admit: 2018-04-05 | Discharge: 2018-04-05 | Disposition: A | Discharge status: Home / Self Care | Source: Ambulatory Visit | Attending: Ophthalmology | Admitting: Ophthalmology

## 2018-04-05 ENCOUNTER — Ambulatory Visit: Admitting: Anesthesiology

## 2018-04-05 ENCOUNTER — Other Ambulatory Visit: Payer: Self-pay

## 2018-04-05 ENCOUNTER — Encounter
Admission: RE | Disposition: A | Discharge status: Home / Self Care | Payer: Self-pay | Source: Ambulatory Visit | Attending: Ophthalmology

## 2018-04-05 DIAGNOSIS — M1990 Unspecified osteoarthritis, unspecified site: Secondary | ICD-10-CM | POA: Insufficient documentation

## 2018-04-05 DIAGNOSIS — Z79899 Other long term (current) drug therapy: Secondary | ICD-10-CM | POA: Insufficient documentation

## 2018-04-05 DIAGNOSIS — F329 Major depressive disorder, single episode, unspecified: Secondary | ICD-10-CM | POA: Insufficient documentation

## 2018-04-05 DIAGNOSIS — Z886 Allergy status to analgesic agent status: Secondary | ICD-10-CM | POA: Diagnosis not present

## 2018-04-05 DIAGNOSIS — F419 Anxiety disorder, unspecified: Secondary | ICD-10-CM | POA: Insufficient documentation

## 2018-04-05 DIAGNOSIS — Z888 Allergy status to other drugs, medicaments and biological substances status: Secondary | ICD-10-CM | POA: Insufficient documentation

## 2018-04-05 DIAGNOSIS — H2511 Age-related nuclear cataract, right eye: Secondary | ICD-10-CM | POA: Diagnosis present

## 2018-04-05 DIAGNOSIS — I10 Essential (primary) hypertension: Secondary | ICD-10-CM | POA: Insufficient documentation

## 2018-04-05 DIAGNOSIS — F17200 Nicotine dependence, unspecified, uncomplicated: Secondary | ICD-10-CM | POA: Insufficient documentation

## 2018-04-05 HISTORY — PX: CATARACT EXTRACTION W/PHACO: SHX586

## 2018-04-05 LAB — CARBAMAZEPINE LEVEL, TOTAL: Carbamazepine (Tegretol), S: 5.8 ug/mL (ref 4.0–12.0)

## 2018-04-05 SURGERY — PHACOEMULSIFICATION, CATARACT, WITH IOL INSERTION
Anesthesia: Monitor Anesthesia Care | Site: Eye | Laterality: Right | Wound class: "Clean "

## 2018-04-05 MED ORDER — LIDOCAINE HCL (PF) 4 % IJ SOLN
INTRAMUSCULAR | Status: AC
  Filled 2018-04-05: qty 5

## 2018-04-05 MED ORDER — POVIDONE-IODINE 5 % OP SOLN
OPHTHALMIC | Status: DC | PRN
  Administered 2018-04-05: 1 via OPHTHALMIC

## 2018-04-05 MED ORDER — NA CHONDROIT SULF-NA HYALURON 40-17 MG/ML IO SOLN
INTRAOCULAR | Status: DC | PRN
  Administered 2018-04-05: 1 mL via INTRAOCULAR

## 2018-04-05 MED ORDER — ARMC OPHTHALMIC DILATING DROPS
1.0000 "application " | OPHTHALMIC | Status: AC
  Administered 2018-04-05 (×3): 1 via OPHTHALMIC

## 2018-04-05 MED ORDER — NA CHONDROIT SULF-NA HYALURON 40-17 MG/ML IO SOLN
INTRAOCULAR | Status: AC
  Filled 2018-04-05: qty 1

## 2018-04-05 MED ORDER — CARBACHOL 0.01 % IO SOLN
INTRAOCULAR | Status: DC | PRN
  Administered 2018-04-05: .5 mL via INTRAOCULAR

## 2018-04-05 MED ORDER — FENTANYL CITRATE (PF) 100 MCG/2ML IJ SOLN
INTRAMUSCULAR | Status: AC
  Filled 2018-04-05: qty 2

## 2018-04-05 MED ORDER — EPINEPHRINE PF 1 MG/ML IJ SOLN
INTRAMUSCULAR | Status: AC
  Filled 2018-04-05: qty 1

## 2018-04-05 MED ORDER — MOXIFLOXACIN HCL 0.5 % OP SOLN
OPHTHALMIC | Status: AC
  Filled 2018-04-05: qty 3

## 2018-04-05 MED ORDER — ARMC OPHTHALMIC DILATING DROPS
OPHTHALMIC | Status: AC
  Administered 2018-04-05: 1 via OPHTHALMIC
  Filled 2018-04-05: qty 0.4

## 2018-04-05 MED ORDER — ONDANSETRON HCL 4 MG/2ML IJ SOLN: 4.0000 mg | Freq: Once | INTRAMUSCULAR | Status: DC | PRN

## 2018-04-05 MED ORDER — LIDOCAINE HCL (PF) 4 % IJ SOLN
INTRAOCULAR | Status: DC | PRN
  Administered 2018-04-05: 2 mL via OPHTHALMIC

## 2018-04-05 MED ORDER — SODIUM CHLORIDE 0.9 % IV SOLN
INTRAVENOUS | Status: DC
  Administered 2018-04-05: 10:00:00 via INTRAVENOUS

## 2018-04-05 MED ORDER — MOXIFLOXACIN HCL 0.5 % OP SOLN: 1.0000 [drp] | OPHTHALMIC | Status: DC | PRN

## 2018-04-05 MED ORDER — POVIDONE-IODINE 5 % OP SOLN
OPHTHALMIC | Status: AC
  Filled 2018-04-05: qty 30

## 2018-04-05 MED ORDER — FENTANYL CITRATE (PF) 100 MCG/2ML IJ SOLN: 25.0000 ug | INTRAMUSCULAR | Status: DC | PRN

## 2018-04-05 MED ORDER — FENTANYL CITRATE (PF) 100 MCG/2ML IJ SOLN
INTRAMUSCULAR | Status: DC | PRN
  Administered 2018-04-05 (×2): 25 ug via INTRAVENOUS

## 2018-04-05 MED ORDER — EPINEPHRINE PF 1 MG/ML IJ SOLN
INTRAOCULAR | Status: DC | PRN
  Administered 2018-04-05: 1 mL via OPHTHALMIC

## 2018-04-05 MED ORDER — MOXIFLOXACIN HCL 0.5 % OP SOLN
OPHTHALMIC | Status: DC | PRN
  Administered 2018-04-05: .2 mL via OPHTHALMIC

## 2018-04-05 MED ORDER — MIDAZOLAM HCL 2 MG/2ML IJ SOLN
INTRAMUSCULAR | Status: DC | PRN
  Administered 2018-04-05: 1 mg via INTRAVENOUS

## 2018-04-05 MED ORDER — MIDAZOLAM HCL 2 MG/2ML IJ SOLN
INTRAMUSCULAR | Status: AC
  Filled 2018-04-05: qty 2

## 2018-04-05 SURGICAL SUPPLY — 16 items
GLOVE BIO SURGEON STRL SZ8 (GLOVE) ×3 IMPLANT
GLOVE BIOGEL M 6.5 STRL (GLOVE) ×3 IMPLANT
GLOVE SURG LX 8.0 MICRO (GLOVE) ×2
GLOVE SURG LX STRL 8.0 MICRO (GLOVE) ×1 IMPLANT
GOWN STRL REUS W/ TWL LRG LVL3 (GOWN DISPOSABLE) ×2 IMPLANT
GOWN STRL REUS W/TWL LRG LVL3 (GOWN DISPOSABLE) ×4
LABEL CATARACT MEDS ST (LABEL) ×3 IMPLANT
LENS IOL TECNIS ITEC 23.5 (Intraocular Lens) ×2 IMPLANT
PACK CATARACT (MISCELLANEOUS) ×3 IMPLANT
PACK CATARACT BRASINGTON LX (MISCELLANEOUS) ×3 IMPLANT
PACK EYE AFTER SURG (MISCELLANEOUS) ×3 IMPLANT
SOL BSS BAG (MISCELLANEOUS) ×3
SOLUTION BSS BAG (MISCELLANEOUS) ×1 IMPLANT
SYR 5ML LL (SYRINGE) ×3 IMPLANT
WATER STERILE IRR 250ML POUR (IV SOLUTION) ×3 IMPLANT
WIPE NON LINTING 3.25X3.25 (MISCELLANEOUS) ×3 IMPLANT

## 2018-04-05 NOTE — Transfer of Care (Signed)
Immediate Anesthesia Transfer of Care Note  Patient: Shannon Schroeder  Procedure(s) Performed: CATARACT EXTRACTION PHACO AND INTRAOCULAR LENS PLACEMENT (IOC) (Right Eye)  Patient Location: PACU  Anesthesia Type:MAC  Level of Consciousness: awake, alert  and oriented  Airway & Oxygen Therapy: Patient Spontanous Breathing  Post-op Assessment: Report given to RN and Post -op Vital signs reviewed and stable  Post vital signs: Reviewed and stable  Last Vitals:  Vitals Value Taken Time  BP    Temp    Pulse    Resp    SpO2      Last Pain:  Vitals:   04/05/18 1009  TempSrc: Temporal  PainSc: 0-No pain         Complications: No apparent anesthesia complications

## 2018-04-05 NOTE — H&P (Signed)
All labs reviewed. Abnormal studies sent to patients PCP when indicated.  Previous H&P reviewed, patient examined, there are NO CHANGES.  Shannon Meara Porfilio6/4/201910:49 AM

## 2018-04-05 NOTE — Anesthesia Preprocedure Evaluation (Signed)
Anesthesia Evaluation  Patient identified by MRN, date of birth, ID band Patient awake    Reviewed: Allergy & Precautions, NPO status , Patient's Chart, lab work & pertinent test results, reviewed documented beta blocker date and time   Airway Mallampati: II  TM Distance: >3 FB     Dental  (+) Chipped   Pulmonary Current Smoker,           Cardiovascular hypertension, Pt. on medications and Pt. on home beta blockers + dysrhythmias Ventricular Tachycardia      Neuro/Psych PSYCHIATRIC DISORDERS Anxiety Depression Bipolar Disorder negative neurological ROS     GI/Hepatic negative GI ROS, Neg liver ROS,   Endo/Other  negative endocrine ROS  Renal/GU negative Renal ROS     Musculoskeletal  (+) Arthritis ,   Abdominal   Peds  Hematology negative hematology ROS (+)   Anesthesia Other Findings   Reproductive/Obstetrics                             Anesthesia Physical  Anesthesia Plan  ASA: III  Anesthesia Plan: MAC   Post-op Pain Management:    Induction:   PONV Risk Score and Plan:   Airway Management Planned:   Additional Equipment:   Intra-op Plan:   Post-operative Plan:   Informed Consent: I have reviewed the patients History and Physical, chart, labs and discussed the procedure including the risks, benefits and alternatives for the proposed anesthesia with the patient or authorized representative who has indicated his/her understanding and acceptance.     Plan Discussed with: CRNA  Anesthesia Plan Comments:         Anesthesia Quick Evaluation

## 2018-04-05 NOTE — Anesthesia Post-op Follow-up Note (Signed)
Anesthesia QCDR form completed.        

## 2018-04-05 NOTE — Anesthesia Postprocedure Evaluation (Signed)
Anesthesia Post Note  Patient: Shannon Schroeder  Procedure(s) Performed: CATARACT EXTRACTION PHACO AND INTRAOCULAR LENS PLACEMENT ([place]) (Right Eye)  Patient location during evaluation: Other Anesthesia Type: MAC Level of consciousness: awake and alert and oriented Pain management: pain level controlled Vital Signs Assessment: post-procedure vital signs reviewed and stable Respiratory status: spontaneous breathing Cardiovascular status: blood pressure returned to baseline Anesthetic complications: no     Last Vitals:  Vitals:   04/05/18 1111 04/05/18 1118  BP: (!) 103/51 (!) 120/54  Pulse: (!) 52 (!) 53  Resp: 18 18  Temp: (!) 35.8 C   SpO2: 97% 100%    Last Pain:  Vitals:   04/05/18 1118  TempSrc:   PainSc: 0-No pain                 Clora Ohmer

## 2018-04-05 NOTE — Discharge Instructions (Addendum)
FOLLOW DR. PORFILIO'S POSTOP EYE DROP INSTRUCTION SHEET AS REVIEWED.  Eye Surgery Discharge Instructions  Expect mild scratchy sensation or mild soreness. DO NOT RUB YOUR EYE!  The day of surgery:  Minimal physical activity, but bed rest is not required  No reading, computer work, or close hand work  No bending, lifting, or straining.  May watch TV  For 24 hours:  No driving, legal decisions, or alcoholic beverages  Safety precautions  Eat anything you prefer: It is better to start with liquids, then soup then solid foods.  Solar shield eyeglasses should be worn for comfort in the sunlight/patch while sleeping  Resume all regular medications including aspirin or Coumadin if these were discontinued prior to surgery. You may shower, bathe, shave, or wash your hair. Tylenol may be taken for mild discomfort.  Call your doctor if you experience significant pain, nausea, or vomiting, fever > 101 or other signs of infection. [phone] or [phone] Specific instructions:  Follow-up Information    Birder Robson, MD Follow up.   Specialty:  Ophthalmology Why:  04/06/18 @ 3:05 pm Contact information: [address] [place] [address] [phone]

## 2018-04-05 NOTE — Op Note (Signed)
PREOPERATIVE DIAGNOSIS:  Nuclear sclerotic cataract of the right eye.   POSTOPERATIVE DIAGNOSIS:  nuclear sclerotic cataract right eye   OPERATIVE PROCEDURE: Procedure(s): CATARACT EXTRACTION PHACO AND INTRAOCULAR LENS PLACEMENT (IOC)   SURGEON:  Birder Robson, MD.   ANESTHESIA:  Anesthesiologist: Alvin Critchley, MD CRNA: Philbert Riser, CRNA  1.      Managed anesthesia care. 2.      0.32ml of Shugarcaine was instilled in the eye following the paracentesis.   COMPLICATIONS:  None.   TECHNIQUE:   Stop and chop   DESCRIPTION OF PROCEDURE:  The patient was examined and consented in the preoperative holding area where the aforementioned topical anesthesia was applied to the right eye and then brought back to the Operating Room where the right eye was prepped and draped in the usual sterile ophthalmic fashion and a lid speculum was placed. A paracentesis was created with the side port blade and the anterior chamber was filled with viscoelastic. A near clear corneal incision was performed with the steel keratome. A continuous curvilinear capsulorrhexis was performed with a cystotome followed by the capsulorrhexis forceps. Hydrodissection and hydrodelineation were carried out with BSS on a blunt cannula. The lens was removed in a stop and chop  technique and the remaining cortical material was removed with the irrigation-aspiration handpiece. The capsular bag was inflated with viscoelastic and the Technis [id number]  lens was placed in the capsular bag without complication. The remaining viscoelastic was removed from the eye with the irrigation-aspiration handpiece. The wounds were hydrated. The anterior chamber was flushed with Miostat and the eye was inflated to physiologic pressure. 0.25ml of Vigamox was placed in the anterior chamber. The wounds were found to be water tight. The eye was dressed with Vigamox. The patient was given protective glasses to wear throughout the day and a shield with which to  sleep tonight. The patient was also given drops with which to begin a drop regimen today and will follow-up with me in one day. Implant Name Type Inv. Item Serial No. Manufacturer Lot No. LRB No. Used  LENS IOL DIOP 23.5 - [id number] Intraocular Lens LENS IOL DIOP 23.5 [id number] AMO  Right 1   Procedure(s) with comments: CATARACT EXTRACTION PHACO AND INTRAOCULAR LENS PLACEMENT (IOC) (Right) - [place] 00:36 AP% 15.9 CDE 5.72 Fluid pack lot # [id number]  Electronically signed: Birder Robson 04/05/2018 11:09 AM

## 2018-04-12 ENCOUNTER — Telehealth: Payer: Self-pay | Admitting: Pharmacist

## 2018-04-12 NOTE — Telephone Encounter (Signed)
04/12/2018 12:24:05 PM - Ventolin HFA paperwork  04/12/18 I have received patient's portion of [employer] application back, I am holding for provider script to be returned.Shannon Schroeder

## 2018-04-19 ENCOUNTER — Telehealth: Payer: Self-pay | Admitting: Pharmacist

## 2018-04-19 NOTE — Telephone Encounter (Signed)
04/19/2018 3:00:37 PM - [employer] enrollment-Ventolin  9/82/64 Faxed [employer] application with script for Ventolin HFA 48mcg Inhale 1-2 puffs every 6 hours as needed for wheezing or shortness of breath for enrollment.Delos Haring

## 2018-04-21 ENCOUNTER — Ambulatory Visit: Payer: Self-pay | Admitting: Adult Health Nurse Practitioner

## 2018-04-21 DIAGNOSIS — J449 Chronic obstructive pulmonary disease, unspecified: Secondary | ICD-10-CM

## 2018-04-21 MED ORDER — TIOTROPIUM BROMIDE MONOHYDRATE 18 MCG IN CAPS: 18.0000 ug | ORAL_CAPSULE | Freq: Every day | RESPIRATORY_TRACT | 12 refills | Status: DC

## 2018-04-21 NOTE — Progress Notes (Signed)
Patient: Shannon Schroeder Female    DOB: [birth year]   62 y.o.   MRN: [id number] Visit Date: 04/21/2018  Today's Provider: Staci Acosta, NP   Chief Complaint  Patient presents with  . Follow-up   Subjective:    HPI   Pt states that she is feeling much improved.  States that the cough has slowed down and she has more energy.  Does endorse still some sputum production.   Not using albuterol daily, may use 3-4 timers per week.  Pt states that she continues to have SOB and dyspnea with walking up the stairs.     Allergies  Allergen Reactions  . Aspirin Swelling  . Naproxen Swelling and Rash  . Paxil [Paroxetine Hcl] Rash  . Phenobarbital Rash  . Prozac [Fluoxetine Hcl] Rash   Previous Medications   ACETAMINOPHEN (TYLENOL) 500 MG TABLET    Take 500-1,000 mg by mouth every 6 (six) hours as needed for moderate pain or headache.   ALBUTEROL (PROVENTIL HFA;VENTOLIN HFA) 108 (90 BASE) MCG/ACT INHALER    Inhale 1-2 puffs into the lungs every 6 (six) hours as needed for wheezing or shortness of breath.   AMLODIPINE (NORVASC) 10 MG TABLET    Take 1 tablet (10 mg total) by mouth daily.   ATENOLOL (TENORMIN) 25 MG TABLET    Take 1 tablet (25 mg total) by mouth 2 (two) times daily.   AZITHROMYCIN (ZITHROMAX) 250 MG TABLET    Two tablets day 1 then one tablet daily until gone.   BREXPIPRAZOLE (REXULTI) 1 MG TABS    Take 1 mg by mouth at bedtime.   CARBAMAZEPINE (TEGRETOL) 200 MG TABLET    Take 600 mg by mouth at bedtime.    DIFLUPREDNATE (DUREZOL OP)    Place 1 drop into the left eye 2 (two) times daily.   DIPHENHYDRAMINE (BENADRYL) 25 MG TABLET    Take 25 mg by mouth 2 (two) times daily.   IBUPROFEN (ADVIL,MOTRIN) 200 MG TABLET    Take 400 mg by mouth every 6 (six) hours as needed for headache or moderate pain.   LAMOTRIGINE (LAMICTAL) 100 MG TABLET    Take 125 mg by mouth daily.   LISINOPRIL (PRINIVIL,ZESTRIL) 40 MG TABLET    Take 1 tablet (40 mg total) by mouth daily.   MICONAZOLE  (MICOTIN) 2 % CREAM    Apply 1 application topically 2 (two) times daily.   PSEUDOEPH-DOXYLAMINE-DM-APAP (NYQUIL PO)    Take 15 mLs by mouth at bedtime as needed (cold).     Review of Systems  All other systems reviewed and are negative.   Social History   Tobacco Use  . Smoking status: Current Every Day Smoker    Packs/day: 1.00    Types: Cigarettes  . Smokeless tobacco: Never Used  Substance Use Topics  . Alcohol use: No    Frequency: Never   Objective:   BP (!) 162/87   Pulse 76   Temp 98 F (36.7 C)   Ht 5\' 5"  (1.651 m)   Wt 177 lb 6.4 oz (80.5 kg)   BMI 29.52 kg/m   Physical Exam  Constitutional: She appears well-developed and well-nourished.  Cardiovascular: Normal rate, regular rhythm and normal heart sounds.  Pulmonary/Chest: Effort normal. No respiratory distress. She has decreased breath sounds. She has no wheezes.  Vitals reviewed.       Assessment & Plan:         COPD:  Start Spiriva 1 inhalations daily.  Continue Albuterol PRN.  Monitor for worsening respiratory symptoms.   BP elevated- will reevaluate at next visit.  Continue current medications.   Staci Acosta, NP   [facility]

## 2018-04-22 ENCOUNTER — Telehealth: Payer: Self-pay | Admitting: Pharmacist

## 2018-04-22 NOTE — Telephone Encounter (Signed)
04/22/2018 2:18:03 PM - Spiriva  04/22/18 I have received pharmacy printout for Spiriva 51mcg Inhale contents of 1 capsule daily using handihaler. 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

## 2018-04-26 ENCOUNTER — Other Ambulatory Visit: Payer: Self-pay

## 2018-04-26 ENCOUNTER — Other Ambulatory Visit: Payer: Self-pay | Admitting: Internal Medicine

## 2018-04-26 MED ORDER — LISINOPRIL 40 MG PO TABS: 40.0000 mg | ORAL_TABLET | Freq: Every day | ORAL | 3 refills | Status: DC

## 2018-04-27 ENCOUNTER — Other Ambulatory Visit: Payer: Self-pay

## 2018-05-03 ENCOUNTER — Other Ambulatory Visit: Payer: Self-pay

## 2018-05-03 DIAGNOSIS — M069 Rheumatoid arthritis, unspecified: Secondary | ICD-10-CM

## 2018-05-04 ENCOUNTER — Encounter: Payer: Self-pay | Admitting: Internal Medicine

## 2018-05-04 ENCOUNTER — Ambulatory Visit: Payer: Self-pay | Admitting: Internal Medicine

## 2018-05-04 VITALS — BP 156/83 | HR 63 | Temp 98.1°F | Ht 65.5 in | Wt 176.7 lb

## 2018-05-04 DIAGNOSIS — R21 Rash and other nonspecific skin eruption: Secondary | ICD-10-CM

## 2018-05-04 DIAGNOSIS — I10 Essential (primary) hypertension: Secondary | ICD-10-CM

## 2018-05-04 LAB — CBC WITH DIFFERENTIAL/PLATELET
BASOS ABS: 0.1 10*3/uL (ref 0.0–0.2)
Basos: 2 %
EOS (ABSOLUTE): 0.2 10*3/uL (ref 0.0–0.4)
EOS: 5 %
HEMOGLOBIN: 11.3 g/dL (ref 11.1–15.9)
Hematocrit: 33.4 % — ABNORMAL LOW (ref 34.0–46.6)
IMMATURE GRANS (ABS): 0 10*3/uL (ref 0.0–0.1)
Immature Granulocytes: 0 %
Lymphocytes Absolute: 1.6 10*3/uL (ref 0.7–3.1)
Lymphs: 31 %
MCH: 32.2 pg (ref 26.6–33.0)
MCHC: 33.8 g/dL (ref 31.5–35.7)
MCV: 95 fL (ref 79–97)
MONOCYTES: 14 %
Monocytes Absolute: 0.7 10*3/uL (ref 0.1–0.9)
NEUTROS ABS: 2.4 10*3/uL (ref 1.4–7.0)
Neutrophils: 48 %
Platelets: 446 10*3/uL (ref 150–450)
RBC: 3.51 x10E6/uL — AB (ref 3.77–5.28)
RDW: 15 % (ref 12.3–15.4)
WBC: 5 10*3/uL (ref 3.4–10.8)

## 2018-05-04 LAB — IRON: Iron: 37 ug/dL (ref 27–139)

## 2018-05-04 LAB — FERRITIN: FERRITIN: 52 ng/mL (ref 15–150)

## 2018-05-04 MED ORDER — AMLODIPINE BESYLATE 5 MG PO TABS: 5.0000 mg | ORAL_TABLET | Freq: Every day | ORAL | 3 refills | Status: DC

## 2018-05-04 MED ORDER — TRIAMCINOLONE ACETONIDE 0.1 % EX CREA: 1.0000 "application " | TOPICAL_CREAM | Freq: Two times a day (BID) | CUTANEOUS | 0 refills | Status: DC

## 2018-05-04 NOTE — Progress Notes (Signed)
Subjective:    Patient ID: Shannon Schroeder, female    DOB: 02-06-1956, 62 y.o.   MRN: [id number]  HPI  Pt reports rash on R and L lower legs and ankles   Pt reports having trouble obtaining her 40 mg lisinopril filled by [facility]; states that [facility] told her that the order had been deleted.   Review of Systems   Patient Active Problem List   Diagnosis Date Noted  . COPD (chronic obstructive pulmonary disease) ([place]) 04/21/2018  . URI (upper respiratory infection) 03/31/2018  . Hypertension 03/31/2018  . Tachycardia 02/02/2018  . Rash of body 02/02/2018  . Hyponatremia 02/02/2018   Allergies as of 05/04/2018      Reactions   Aspirin Swelling   Naproxen Swelling, Rash   Paxil [paroxetine Hcl] Rash   Phenobarbital Rash   Prozac [fluoxetine Hcl] Rash      Medication List        Accurate as of 05/04/18  9:30 AM. Always use your most recent med list.          acetaminophen 500 MG tablet Commonly known as:  TYLENOL Take 500-1,000 mg by mouth every 6 (six) hours as needed for moderate pain or headache.   albuterol 108 (90 Base) MCG/ACT inhaler Commonly known as:  PROVENTIL HFA;VENTOLIN HFA Inhale 1-2 puffs into the lungs every 6 (six) hours as needed for wheezing or shortness of breath.   amLODipine 10 MG tablet Commonly known as:  NORVASC Take 1 tablet (10 mg total) by mouth daily.   atenolol 25 MG tablet Commonly known as:  TENORMIN Take 1 tablet (25 mg total) by mouth 2 (two) times daily.   azithromycin 250 MG tablet Commonly known as:  ZITHROMAX Two tablets day 1 then one tablet daily until gone.   carbamazepine 200 MG tablet Commonly known as:  TEGRETOL Take 600 mg by mouth at bedtime.   diphenhydrAMINE 25 MG tablet Commonly known as:  BENADRYL Take 25 mg by mouth 2 (two) times daily.   DUREZOL OP Place 1 drop into the left eye 2 (two) times daily.   ibuprofen 200 MG tablet Commonly known as:  ADVIL,MOTRIN Take 400 mg by mouth every 6  (six) hours as needed for headache or moderate pain.   LAMICTAL 100 MG tablet Generic drug:  lamoTRIgine Take 125 mg by mouth daily.   lisinopril 40 MG tablet Commonly known as:  PRINIVIL,ZESTRIL Take 1 tablet (40 mg total) by mouth daily.   miconazole 2 % cream Commonly known as:  MICOTIN Apply 1 application topically 2 (two) times daily.   NYQUIL PO Take 15 mLs by mouth at bedtime as needed (cold).   REXULTI 1 MG Tabs Generic drug:  Brexpiprazole Take 1 mg by mouth at bedtime.   tiotropium 18 MCG inhalation capsule Commonly known as:  SPIRIVA Place 1 capsule (18 mcg total) into inhaler and inhale daily.          Objective:   Physical Exam  Constitutional: She is oriented to person, place, and time.  Cardiovascular: Normal rate, regular rhythm and normal heart sounds.  Pulmonary/Chest: Effort normal and breath sounds normal.  Neurological: She is alert and oriented to person, place, and time.    BP (!) 156/83   Pulse 63   Temp 98.1 F (36.7 C)   Ht 5' 5.5" (1.664 m)   Wt 176 lb 11.2 oz (80.2 kg)   BMI 28.96 kg/m   Red non peticulal rash on both ankles  Assessment & Plan:   Pt educated on leg edema that occurs with amlodipine use   D/C daily 10 mg Amlodipine and start daily 5 mg Amlodipine  Return in 3 weeks for follow up on rash on legs, leg edema, and BP

## 2018-05-16 ENCOUNTER — Telehealth: Payer: Self-pay | Admitting: Pharmacist

## 2018-05-16 NOTE — Telephone Encounter (Signed)
05/16/2018 10:31:00 AM - [name] Handihaler 10 mcg  05/16/18 Faxed [employer] PAP application for enrollment-Spiriva Handihaler 18 mcg Inhale the contents of 1 capsule daily using handihaler.Shannon Schroeder

## 2018-05-25 ENCOUNTER — Ambulatory Visit: Payer: Self-pay | Admitting: Internal Medicine

## 2018-06-02 ENCOUNTER — Ambulatory Visit: Payer: Self-pay | Admitting: Family Medicine

## 2018-06-02 VITALS — BP 151/76 | HR 63 | Temp 98.1°F | Ht 65.5 in | Wt 181.6 lb

## 2018-06-02 DIAGNOSIS — Z720 Tobacco use: Secondary | ICD-10-CM | POA: Insufficient documentation

## 2018-06-02 DIAGNOSIS — J449 Chronic obstructive pulmonary disease, unspecified: Secondary | ICD-10-CM

## 2018-06-02 DIAGNOSIS — I10 Essential (primary) hypertension: Secondary | ICD-10-CM

## 2018-06-02 MED ORDER — HYDRALAZINE HCL 25 MG PO TABS: 25.0000 mg | ORAL_TABLET | Freq: Three times a day (TID) | ORAL | 1 refills | Status: DC

## 2018-06-02 NOTE — Patient Instructions (Signed)
Start Hydralazine 1 tab 3 times a day and monitor your BP outside the office.

## 2018-06-02 NOTE — Progress Notes (Signed)
  Subjective:     Patient ID: Shannon Schroeder, female   DOB: 06-Aug-1956, 62 y.o.   MRN: [id number]  HPI  Is on Lisinopril, Amlodipine and Atenolol daily.  Has not checked BP outside the office. No SOB except due top COPD. No chest pain or palpitations.  Had swelling in her extremities which improved after cutting down Amlodipine dose.  Has not yet started Spiriva.   Review of Systems     Objective:   Physical Exam A+O CTA RRR NT/ND +2 pulses, no edema    Assessment:   HTN COPD    Plan:   add hydralazine 25 mg TID. Stay on other BP meds.  Monitor BP outside the office.  COPD.  Will start Spiriva once a day.  continue to use Albuterol as needed.  Advised to stop smoking completely.  RTC in a month

## 2018-06-15 ENCOUNTER — Observation Stay

## 2018-06-15 ENCOUNTER — Emergency Department

## 2018-06-15 ENCOUNTER — Observation Stay
Admission: EM | Admit: 2018-06-15 | Discharge: 2018-06-15 | Disposition: A | Discharge status: Other Acute Inpatient Hospital | Attending: Emergency Medicine | Admitting: Emergency Medicine

## 2018-06-15 ENCOUNTER — Encounter: Payer: Self-pay | Admitting: Radiology

## 2018-06-15 DIAGNOSIS — E871 Hypo-osmolality and hyponatremia: Secondary | ICD-10-CM

## 2018-06-15 DIAGNOSIS — I10 Essential (primary) hypertension: Secondary | ICD-10-CM | POA: Insufficient documentation

## 2018-06-15 DIAGNOSIS — F17210 Nicotine dependence, cigarettes, uncomplicated: Secondary | ICD-10-CM | POA: Insufficient documentation

## 2018-06-15 DIAGNOSIS — J449 Chronic obstructive pulmonary disease, unspecified: Secondary | ICD-10-CM | POA: Insufficient documentation

## 2018-06-15 DIAGNOSIS — G459 Transient cerebral ischemic attack, unspecified: Secondary | ICD-10-CM

## 2018-06-15 DIAGNOSIS — F319 Bipolar disorder, unspecified: Secondary | ICD-10-CM | POA: Insufficient documentation

## 2018-06-15 DIAGNOSIS — I639 Cerebral infarction, unspecified: Secondary | ICD-10-CM

## 2018-06-15 DIAGNOSIS — Z79899 Other long term (current) drug therapy: Secondary | ICD-10-CM | POA: Insufficient documentation

## 2018-06-15 DIAGNOSIS — Z886 Allergy status to analgesic agent status: Secondary | ICD-10-CM | POA: Insufficient documentation

## 2018-06-15 DIAGNOSIS — F419 Anxiety disorder, unspecified: Secondary | ICD-10-CM | POA: Insufficient documentation

## 2018-06-15 DIAGNOSIS — I671 Cerebral aneurysm, nonruptured: Principal | ICD-10-CM | POA: Insufficient documentation

## 2018-06-15 LAB — CSF CELL COUNT WITH DIFFERENTIAL
Eosinophils, CSF: 0 %
Eosinophils, CSF: 0 %
Lymphs, CSF: 14 %
Lymphs, CSF: 50 %
MONOCYTE-MACROPHAGE-SPINAL FLUID: 14 %
MONOCYTE-MACROPHAGE-SPINAL FLUID: 7 %
OTHER CELLS CSF: 0
Other Cells, CSF: 0
RBC Count, CSF: 1223 /mm3 — ABNORMAL HIGH (ref 0–3)
RBC Count, CSF: 3724 /mm3 — ABNORMAL HIGH (ref 0–3)
Segmented Neutrophils-CSF: 43 %
Segmented Neutrophils-CSF: 72 %
TUBE #: 1
TUBE #: 3
WBC, CSF: 0 /mm3 (ref 0–5)
WBC, CSF: 0 /mm3 (ref 0–5)

## 2018-06-15 LAB — CBC
HCT: 37.7 % (ref 35.0–47.0)
Hemoglobin: 13.2 g/dL (ref 12.0–16.0)
MCH: 34.1 pg — ABNORMAL HIGH (ref 26.0–34.0)
MCHC: 35 g/dL (ref 32.0–36.0)
MCV: 97.4 fL (ref 80.0–100.0)
PLATELETS: 392 10*3/uL (ref 150–440)
RBC: 3.87 MIL/uL (ref 3.80–5.20)
RDW: 14.9 % — AB (ref 11.5–14.5)
WBC: 5.9 10*3/uL (ref 3.6–11.0)

## 2018-06-15 LAB — COMPREHENSIVE METABOLIC PANEL
ALT: 13 U/L (ref 0–44)
AST: 21 U/L (ref 15–41)
Albumin: 4.2 g/dL (ref 3.5–5.0)
Alkaline Phosphatase: 136 U/L — ABNORMAL HIGH (ref 38–126)
Anion gap: 8 (ref 5–15)
BILIRUBIN TOTAL: 0.4 mg/dL (ref 0.3–1.2)
BUN: 8 mg/dL (ref 8–23)
CHLORIDE: 90 mmol/L — AB (ref 98–111)
CO2: 26 mmol/L (ref 22–32)
CREATININE: 0.48 mg/dL (ref 0.44–1.00)
Calcium: 9 mg/dL (ref 8.9–10.3)
GFR calc Af Amer: >60 mL/min (ref >60)
Glucose, Bld: 95 mg/dL (ref 70–99)
Potassium: 4.3 mmol/L (ref 3.5–5.1)
Sodium: 124 mmol/L — ABNORMAL LOW (ref 135–145)
TOTAL PROTEIN: 7.8 g/dL (ref 6.5–8.1)

## 2018-06-15 LAB — PROTIME-INR
INR: 0.89
PROTHROMBIN TIME: 12 s (ref 11.4–15.2)

## 2018-06-15 LAB — GLUCOSE, CAPILLARY: GLUCOSE-CAPILLARY: 93 mg/dL (ref 70–99)

## 2018-06-15 IMAGING — CT CT HEAD W/O CM
3 series · 15 of 45 positions shown, 18 images · non-contrast
Comparison: None.

CLINICAL DATA: 62-year-old female with persistent headache since
yesterday when the patient experienced an episode of altered mental
status.

EXAM:
CT HEAD WITHOUT CONTRAST
TECHNIQUE: Contiguous axial images were obtained from the base of the skull
through the vertex without intravenous contrast.

[Series 3: head wo · axial · 0.43mm/px · z∈[-125,-10]mm · 9 of 28 slices shown, 12 images]
[im 3/28  brain]
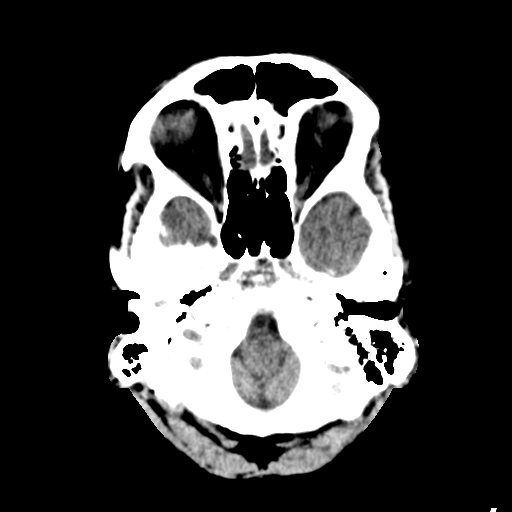
[im 3/28  bone]
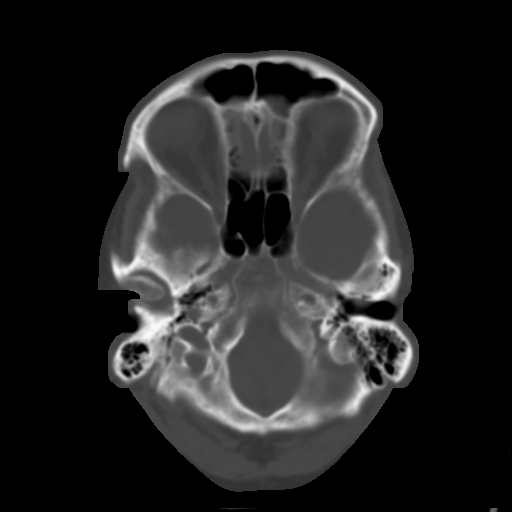
[im 6/28  brain]
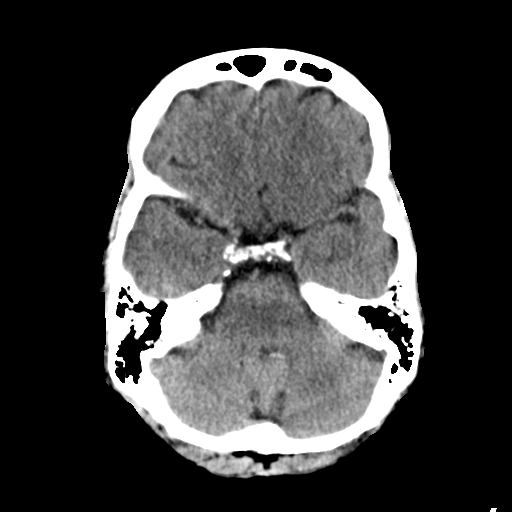
[im 9/28  brain]
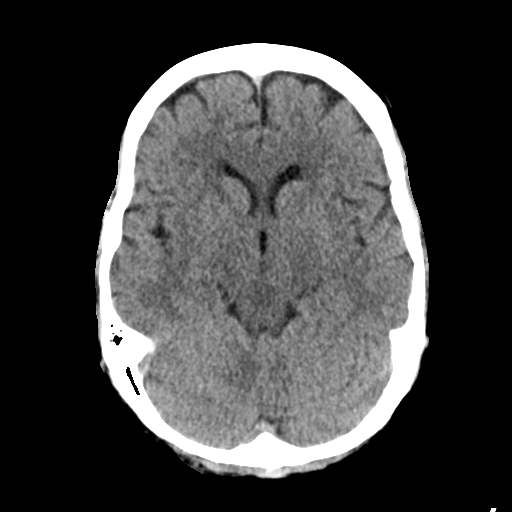
[im 12/28  brain]
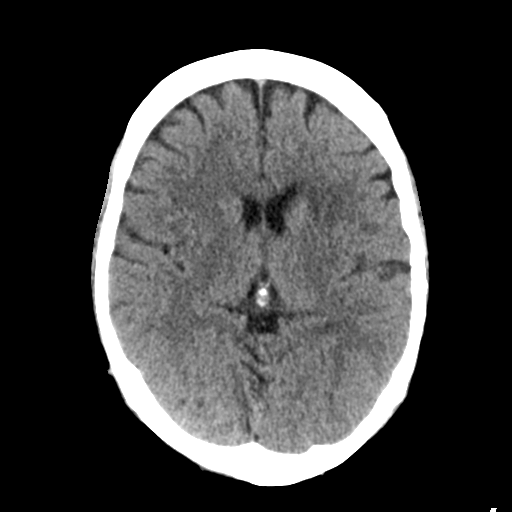
[im 15/28  brain]
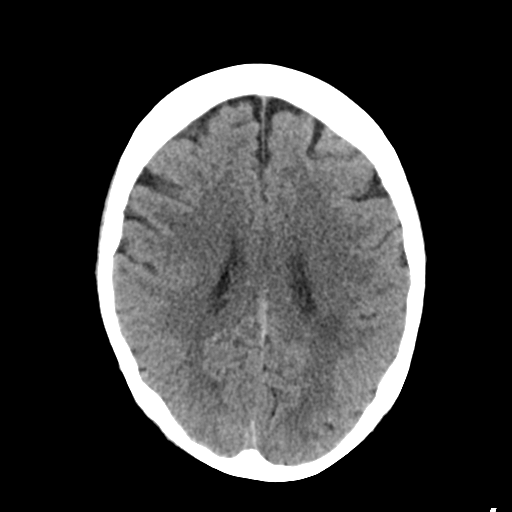
[im 15/28  bone]
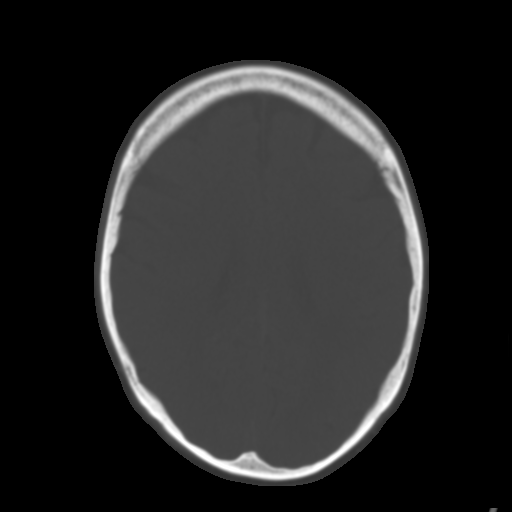
[im 17/28  brain]
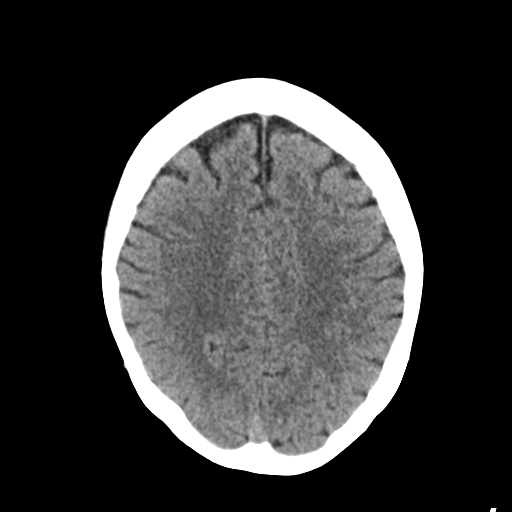
[im 20/28  brain]
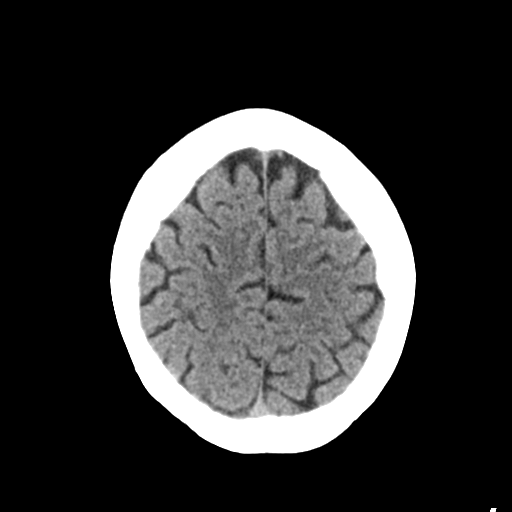
[im 23/28  brain]
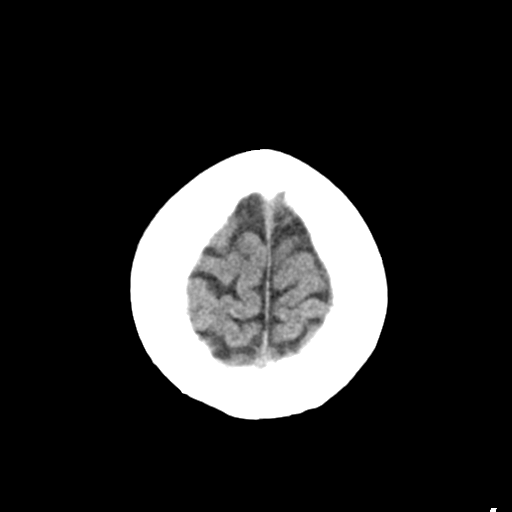
[im 26/28  brain]
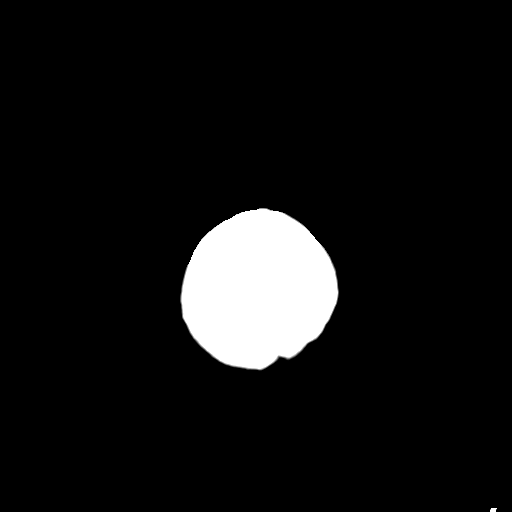
[im 26/28  bone]
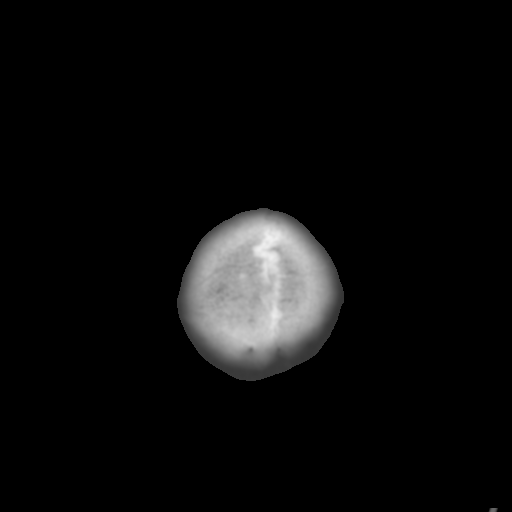

[Series 4: coronal soft tissue · coronal · 0.30mm/px · 3 of 67 slices shown]
[im 23/67  brain]
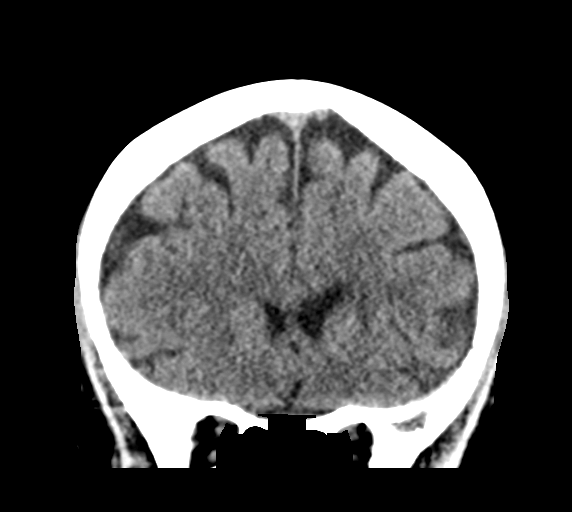
[im 30/67  brain]
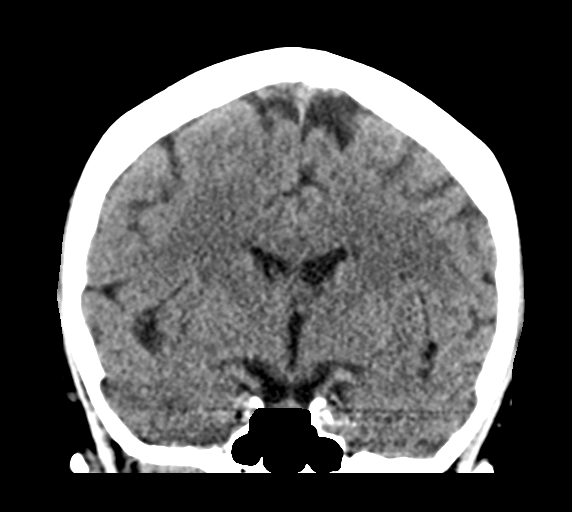
[im 37/67  brain]
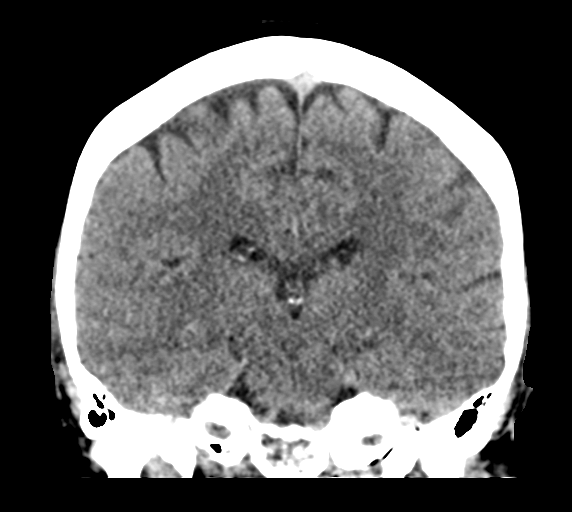

[Series 5: sagittal soft tissue · sagittal · 0.31mm/px · 3 of 55 slices shown]
[im 19/55  brain]
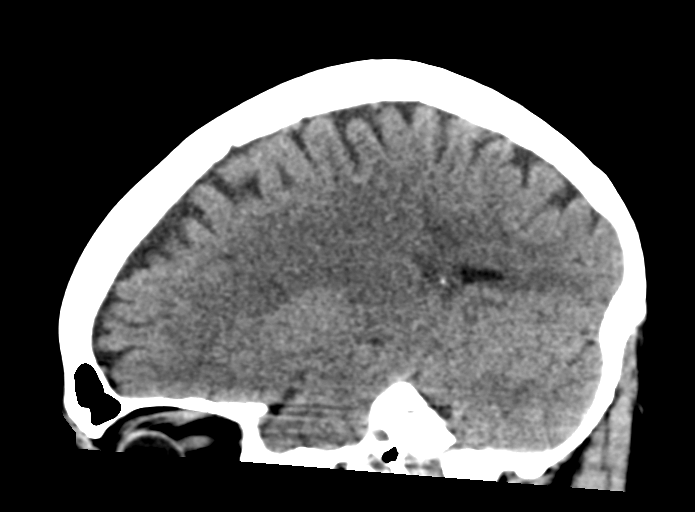
[im 28/55  brain]
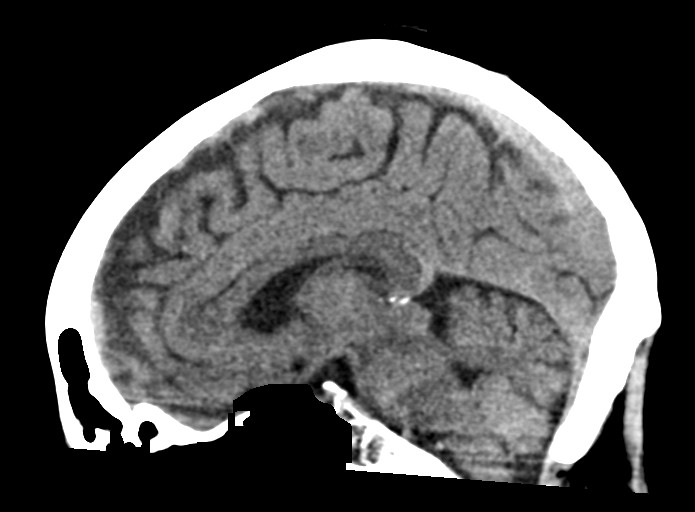
[im 37/55  brain]
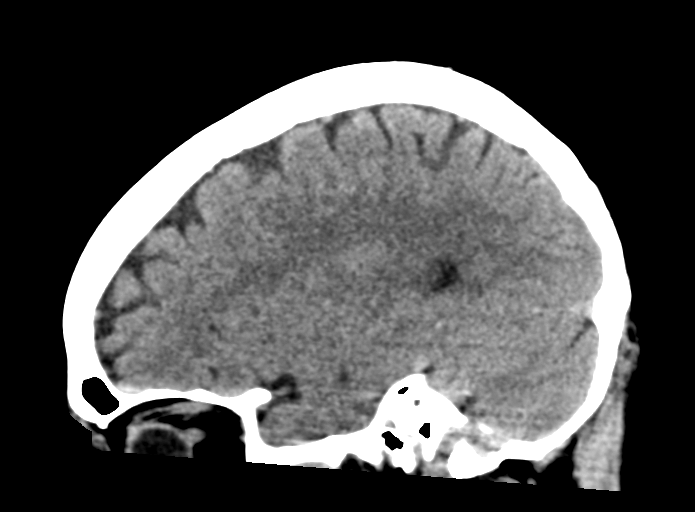

[15 of 45 positions shown; findings below may reference images not displayed]

FINDINGS: Brain: Cerebral volume is within normal limits for age. There is
patchy hypodensity in the left frontal lobe white matter, most
pronounced near the left frontal horn on series 3, image 12.
Elsewhere gray-white matter differentiation is normal. No midline
shift, ventriculomegaly, mass effect, evidence of mass lesion,
intracranial hemorrhage or evidence of cortically based acute
infarction. No cortical encephalomalacia.

Vascular: Mild Calcified atherosclerosis at the skull base. No
suspicious intracranial vascular hyperdensity.

Skull: Negative.

Sinuses/Orbits: Visualized paranasal sinuses and mastoids are well
pneumatized.

Other: Visualized orbit soft tissues are within normal limits. No
acute scalp soft tissues findings.
IMPRESSION: 1. Patchy left frontal lobe white matter hypodensity most commonly
due to small vessel disease, age indeterminate.
2. Otherwise negative noncontrast head CT.

## 2018-06-15 IMAGING — CT CT ANGIO NECK
1 of 8 series · 6 of 33 positions shown · IV contrast (APPLIED)
Comparison: Head CT from earlier today

CLINICAL DATA: Acute onset headache yesterday.

EXAM:
CT ANGIOGRAPHY HEAD AND NECK
TECHNIQUE: Multidetector CT imaging of the head and neck was performed using
the standard protocol during bolus administration of intravenous
contrast. Multiplanar CT image reconstructions and MIPs were
obtained to evaluate the vascular anatomy. Carotid stenosis
measurements (when applicable) are obtained utilizing NASCET
criteria, using the distal internal carotid diameter as the
denominator.
CONTRAST:  75mL [YI] IOPAMIDOL ([YI]) INJECTION 76%

[Series 6: ax thin · axial · 0.46mm/px · z∈[-336,-100]mm · 6 of 332 slices shown]
[im 48/332  soft-tissue]
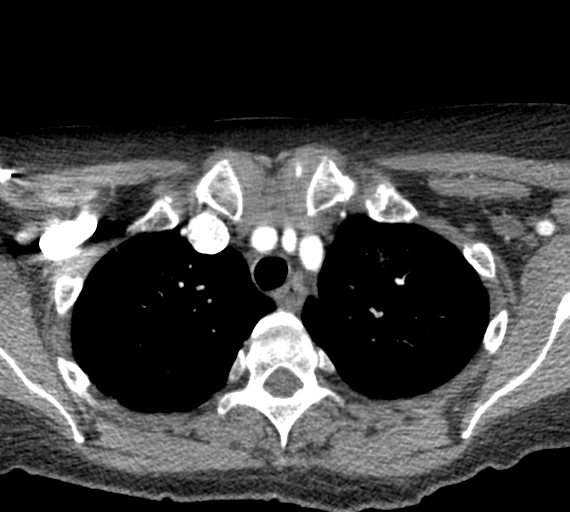
[im 95/332  bone]
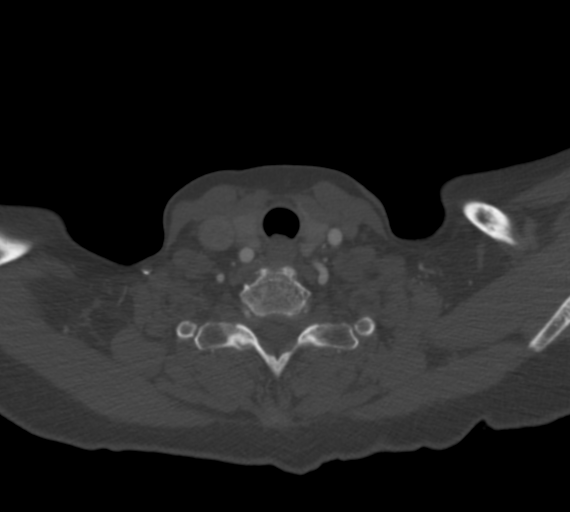
[im 142/332  soft-tissue]
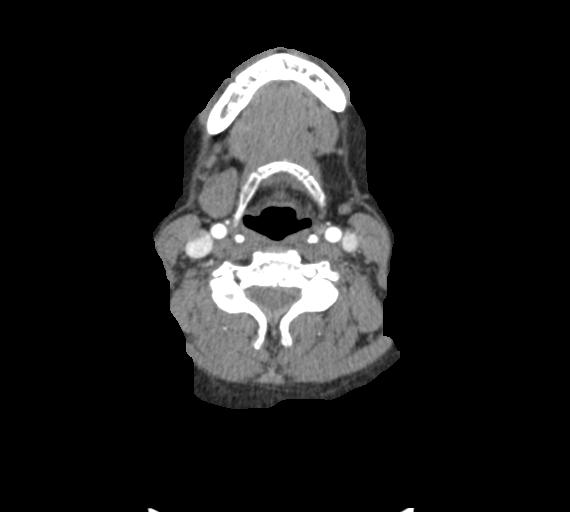
[im 190/332  bone]
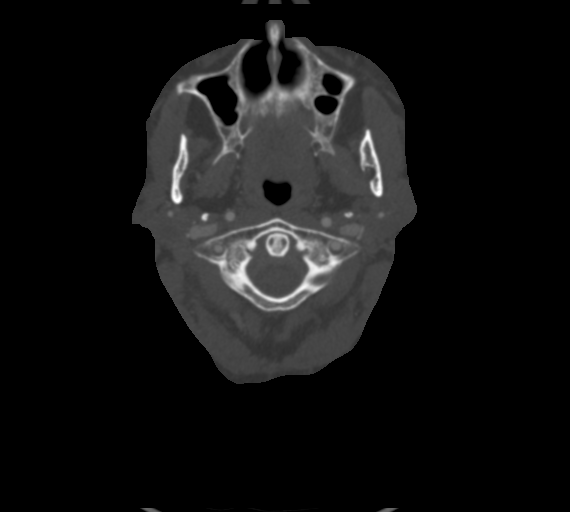
[im 237/332  soft-tissue]
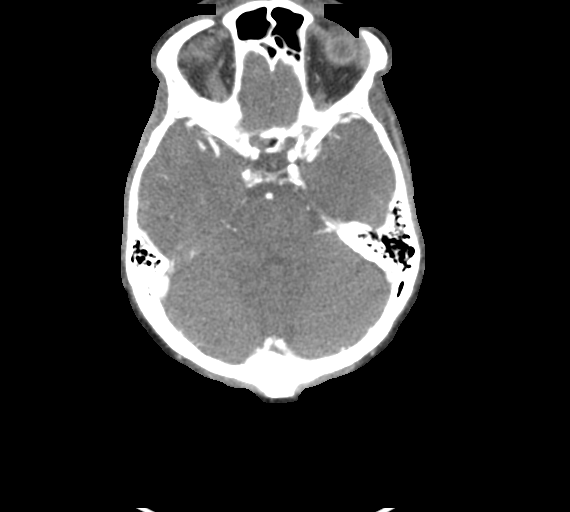
[im 284/332  bone]
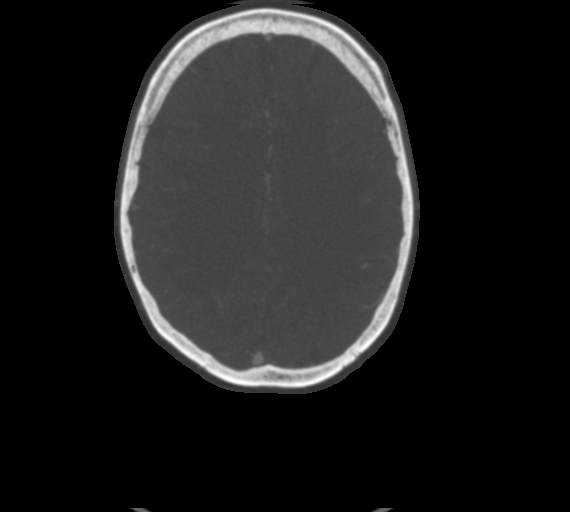

[6 of 33 positions shown; findings below may reference images not displayed]

FINDINGS: CTA NECK FINDINGS

Aortic arch: Normal appearance with 3 vessel branching.

Right carotid system: Minimal calcified plaque at the common carotid
bifurcation. No stenosis or ulceration.

Left carotid system: Calcified plaque at the bifurcation without
flow limiting stenosis or ulceration.

Vertebral arteries: No proximal subclavian stenosis. There is mild
left vertebral artery dominance. The vertebral arteries are tortuous
without definite beading. Negative for dissection or significant
stenosis.

Skeleton: Diffuse degenerative disc narrowing with endplate
spurring. Right C2-3 facet arthritis with spurring and
anterolisthesis. Right foraminal impingement at this level at least.

Other neck: No acute inflammation.

Upper chest: Ground-glass opacity in the bilateral apical lungs with
airway thickening.

Review of the MIP images confirms the above findings

CTA HEAD FINDINGS

Anterior circulation: Atherosclerotic plaque on the bilateral
carotid siphons without flow limiting stenosis. Superiorly directed
right MCA aneurysm just before the bifurcation. Base to dome is
mm and maximal transverse span is 3 mm. The aneurysm is somewhat
lobulated appearance on MIP images but has a fairly smooth contour
on 3D rendering. The shape is conical. A similar outpouching
superiorly from the right M1 segment has a vessel emanating from the
tip, consistent with infundibulum. Anticipate catheter angiography
for further evaluation. No branch occlusion is seen.

Posterior circulation: Vertebrobasilar arteries are smooth and
widely patent. Negative for stenosis, branch occlusion, beading, or
aneurysm.

Venous sinuses: Patent

Anatomic variants: None significant

Delayed phase: No abnormal intracranial enhancement

Review of the MIP images confirms the above findings
IMPRESSION: 1. 4.5 x 3 mm right distal M1 aneurysm with pointed sac morphology.
Prior noncontrast head CT was negative for subarachnoid hemorrhage.
2. Atherosclerosis without flow limiting stenosis.
3. Biapical patchy lung opacity which could be atelectasis or
pneumonitis.

## 2018-06-15 IMAGING — RF DG FLUORO GUIDE LUMBAR PUNCTURE
1 series · 1 of 1 positions shown · non-contrast
Comparison: none

CLINICAL DATA: Cerebral aneurysm with headache

[Series 1: cp_standard · 0.18mm/px · 1 of 1 slices shown]
[im 1/1]
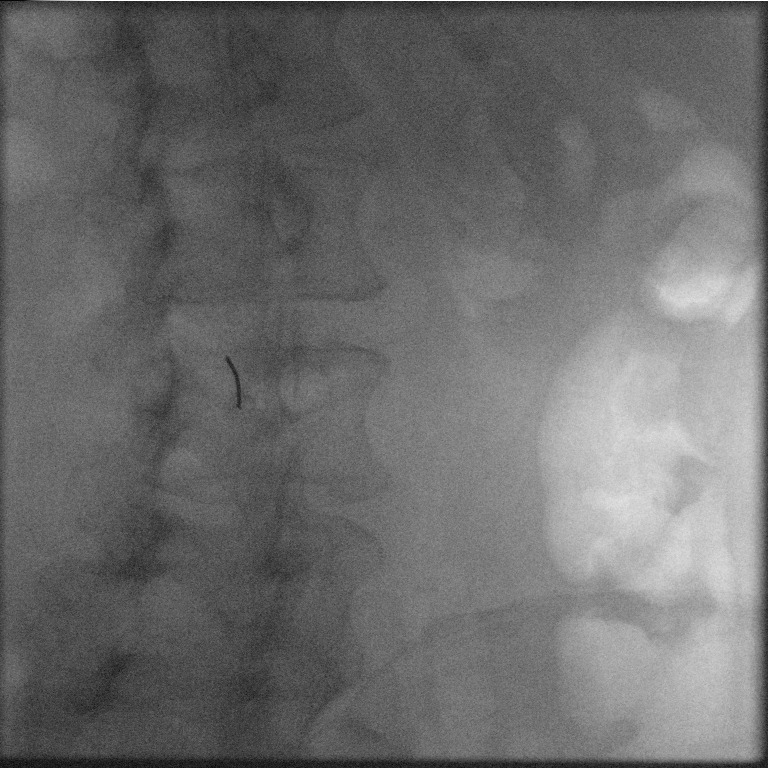

[1 of 1 positions shown; findings below may reference images not displayed]

EXAM:
DIAGNOSTIC LUMBAR PUNCTURE UNDER FLUOROSCOPIC GUIDANCE

FLUOROSCOPY TIME:  Fluoroscopy Time:  2 minutes 54 seconds

Number of Acquired Spot Images: 1

PROCEDURE:
Informed consent was obtained from the patient prior to the
procedure by the emergency department. Previous and successful
attempts have been made to obtain fluid by the emergency department.
I did discuss issues with the patient including headache, bleeding,
infection, and sequelae of these issues.

With the patient prone oblique, the lower back was prepped with
Betadine. 1% Lidocaine was used for local anesthesia. Lumbar
puncture was performed at the L3-4 level using a 20 gauge needle
with return of initially moderately sanguinous with gradually
clearing CSF with an opening pressure of 7.5 cm water. A total of 17
ml of CSF were obtained for laboratory studies. Closing pressure
measured at 3.2 cm water. At the end of the procedure, the needle
was removed. The patient was transferred from the fluoroscopy suite
to the emergency department in stable condition. No immediate
complications.
IMPRESSION: CSF obtained via a 20 gauge spinal needle. Fluid was initially
moderately sanguinous with gradual clearing. Fluid was minimally
sanguinous at the end of procedure. Opening pressure measured at
cm water. Closing pressure measured at 3.2 cm water. No immediate
complications.

## 2018-06-15 MED ORDER — LEVETIRACETAM IN NACL 500 MG/100ML IV SOLN
500.0000 mg | Freq: Once | INTRAVENOUS | Status: DC
  Filled 2018-06-15: qty 100

## 2018-06-15 MED ORDER — LIDOCAINE HCL (PF) 1 % IJ SOLN
5.0000 mL | Freq: Once | INTRAMUSCULAR | Status: AC
Start: 2018-06-15 — End: 2018-06-15
  Administered 2018-06-15: 10 mL via INTRADERMAL

## 2018-06-15 MED ORDER — LIDOCAINE HCL (PF) 1 % IJ SOLN: 10.0000 mL | Freq: Once | INTRAMUSCULAR | Status: DC

## 2018-06-15 MED ORDER — IOPAMIDOL (ISOVUE-370) INJECTION 76%
75.0000 mL | Freq: Once | INTRAVENOUS | Status: AC | PRN
  Administered 2018-06-15: 75 mL via INTRAVENOUS

## 2018-06-15 MED ORDER — ACETAMINOPHEN 325 MG PO TABS: 650.0000 mg | ORAL_TABLET | ORAL | Status: DC | PRN

## 2018-06-15 MED ORDER — SODIUM CHLORIDE 0.9 % IV SOLN
500.0000 mg | Freq: Once | INTRAVENOUS | Status: AC
  Administered 2018-06-15: 500 mg via INTRAVENOUS
  Filled 2018-06-15: qty 500

## 2018-06-15 MED ORDER — LIDOCAINE HCL (PF) 1 % IJ SOLN
INTRAMUSCULAR | Status: AC
  Administered 2018-06-15: 10 mL via INTRADERMAL
  Filled 2018-06-15: qty 5

## 2018-06-15 MED ORDER — NIMODIPINE 30 MG PO CAPS
60.0000 mg | ORAL_CAPSULE | Freq: Once | ORAL | Status: AC
  Administered 2018-06-15: 60 mg via ORAL
  Filled 2018-06-15: qty 2

## 2018-06-15 NOTE — ED Notes (Signed)
Pt states she started feeling discomfort  on left of body yesterday at 3pm. Pt c/o h/a pain 7/10 and L/arm numbness.  VSS. NAD noted. Program Director of transitional home at bed side.

## 2018-06-15 NOTE — ED Notes (Signed)
Attempted IV access x 1 - unsuccessful.

## 2018-06-15 NOTE — ED Notes (Signed)
Per Dr. Cinda Quest, unable to perform LP. Radiology paged for flouro-guided LP. Pt is to remain in ED until after results of LP to confirm whether or not patient has subarachnoid bleed. Opal Sidles, Charge RN aware.

## 2018-06-15 NOTE — ED Notes (Signed)
Pt alert and oriented in triage, no neurologic deficits noted, pt answering questions appropriately. Grip strengths equal bilaterally.

## 2018-06-15 NOTE — ED Notes (Signed)
Pt taken for fluro-guided LP at this time.

## 2018-06-15 NOTE — ED Notes (Signed)
Informed RN that patient has been roomed and is ready for evaluation.  Patient in NAD at this time and call bell placed within reach.   

## 2018-06-15 NOTE — ED Notes (Signed)
Informed consent obtained by this RN and MD, placed in patient's chart. Pt assisted to the bathroom by Carney Harder, RN.

## 2018-06-15 NOTE — H&P (Signed)
[facility] NAME: Shannon Schroeder    MR#:  [id number]  DATE OF BIRTH:  02/13/1956  DATE OF ADMISSION:  06/15/2018  PRIMARY CARE PHYSICIAN: Tawni Millers, MD   REQUESTING/REFERRING PHYSICIAN: malinda  CHIEF COMPLAINT:   Chief Complaint  Patient presents with  . Altered Mental Status    HISTORY OF PRESENT ILLNESS: Shannon Schroeder  is a 62 y.o. female with a known history of anxiety, bipolar disorder, depression, V. tach, hypertension-currently living in a transition home after coming out of prison since last December.  At baseline she walks without any support and does not have any weakness. Yesterday she had severe headache and she was also very confused and could not do some basic stuff because of her confusion.  She was able to walk and move all her limbs, also denies any associated vision or hearing problems. Her speech was normal but her mild headache continues today so she was brought to emergency room for further work-up.  CT scan of head and neck angiogram is done which is negative for any acute bleed or pathology, have some incidental finding of aneurysm. ER physician suggested to admit to hospitalist team for further management and diagnostic of TIA.  PAST MEDICAL HISTORY:   Past Medical History:  Diagnosis Date  . Anxiety   . Arthritis   . Bipolar disorder ([place])   . Depression   . Dysrhythmia    H/O V TACH  . Hypertension     PAST SURGICAL HISTORY:  Past Surgical History:  Procedure Laterality Date  . ABDOMINAL HYSTERECTOMY     partial  . BREAST SURGERY     biopsy  . CARDIAC CATHETERIZATION     X 2  . CARPECTOMY HAND Right   . CATARACT EXTRACTION W/PHACO Left 03/08/2018   Procedure: CATARACT EXTRACTION PHACO AND INTRAOCULAR LENS PLACEMENT (IOC);  Surgeon: Birder Robson, MD;  Location: [facility] ORS;  Service: Ophthalmology;  Laterality: Left;  [place] 00:55 AP% 13.5 CDE 7.52 Fluid pack lot # [id number]  . CATARACT  EXTRACTION W/PHACO Right 04/05/2018   Procedure: CATARACT EXTRACTION PHACO AND INTRAOCULAR LENS PLACEMENT (IOC);  Surgeon: Birder Robson, MD;  Location: [facility] ORS;  Service: Ophthalmology;  Laterality: Right;  [place] 00:36 AP% 15.9 CDE 5.72 Fluid pack lot # [id number]  . CHOLECYSTECTOMY    . TUBAL LIGATION      SOCIAL HISTORY:  Social History   Tobacco Use  . Smoking status: Current Every Day Smoker    Packs/day: 1.00    Types: Cigarettes  . Smokeless tobacco: Never Used  Substance Use Topics  . Alcohol use: No    Frequency: Never    FAMILY HISTORY:  Family History  Problem Relation Age of Onset  . Hypertension Mother   . Heart failure Mother   . Hypertension Brother   . Stroke Brother   . Hypertension Son   . Emphysema Maternal Aunt   . Hypertension Paternal Aunt   . Hypertension Paternal Uncle   . Hypertension Maternal Grandmother   . Hypertension Paternal Grandmother     DRUG ALLERGIES:  Allergies  Allergen Reactions  . Aspirin Swelling  . Naproxen Swelling and Rash  . Paxil [Paroxetine Hcl] Rash  . Phenobarbital Rash  . Prozac [Fluoxetine Hcl] Rash    REVIEW OF SYSTEMS:   CONSTITUTIONAL: No fever, fatigue or weakness.  EYES: No blurred or double vision.  EARS, NOSE, AND THROAT: No tinnitus or ear pain.  RESPIRATORY: No cough,  shortness of breath, wheezing or hemoptysis.  CARDIOVASCULAR: No chest pain, orthopnea, edema.  GASTROINTESTINAL: No nausea, vomiting, diarrhea or abdominal pain.  GENITOURINARY: No dysuria, hematuria.  ENDOCRINE: No polyuria, nocturia,  HEMATOLOGY: No anemia, easy bruising or bleeding SKIN: No rash or lesion. MUSCULOSKELETAL: No joint pain or arthritis.   NEUROLOGIC: No tingling, numbness, weakness.  PSYCHIATRY: No anxiety or depression.   MEDICATIONS AT HOME:  Prior to Admission medications   Medication Sig Start Date End Date Taking? Authorizing Provider  acetaminophen (TYLENOL) 500 MG tablet Take 500-1,000 mg by mouth every 6  (six) hours as needed for moderate pain or headache.   Yes [provider]  albuterol (PROVENTIL HFA;VENTOLIN HFA) 108 (90 Base) MCG/ACT inhaler Inhale 1-2 puffs into the lungs every 6 (six) hours as needed for wheezing or shortness of breath. 03/31/18  Yes Doles-Johnson, Teah, NP  amLODipine (NORVASC) 5 MG tablet Take 1 tablet (5 mg total) by mouth daily. 05/04/18  Yes Tawni Millers, MD  atenolol (TENORMIN) 25 MG tablet Take 1 tablet (25 mg total) by mouth 2 (two) times daily. Patient taking differently: Take 50 mg by mouth every evening.  11/03/17  Yes Chaplin, Dianah Field, MD  Brexpiprazole (REXULTI) 1 MG TABS Take 1 mg by mouth at bedtime.   Yes [provider]  carbamazepine (TEGRETOL) 200 MG tablet Take 600 mg by mouth at bedtime.    Yes [provider]  hydrALAZINE (APRESOLINE) 25 MG tablet Take 1 tablet (25 mg total) by mouth 3 (three) times daily. 06/02/18  Yes Juluis Pitch, MD  lamoTRIgine (LAMICTAL) 100 MG tablet Take 125 mg by mouth at bedtime.    Yes [provider]  lisinopril (PRINIVIL,ZESTRIL) 40 MG tablet Take 1 tablet (40 mg total) by mouth daily. 04/26/18  Yes Doles-Johnson, Teah, NP  Pseudoeph-Doxylamine-DM-APAP (NYQUIL PO) Take 15 mLs by mouth at bedtime as needed (cold).    Yes [provider]  azithromycin (ZITHROMAX) 250 MG tablet Two tablets day 1 then one tablet daily until gone. Patient not taking: Reported on 05/04/2018 03/31/18   Doles-Johnson, Teah, NP  ibuprofen (ADVIL,MOTRIN) 200 MG tablet Take 400 mg by mouth every 6 (six) hours as needed for headache or moderate pain.    [provider]  miconazole (MICOTIN) 2 % cream Apply 1 application topically 2 (two) times daily. Patient not taking: Reported on 03/24/2018 02/02/18   Tawni Millers, MD  tiotropium (SPIRIVA) 18 MCG inhalation capsule Place 1 capsule (18 mcg total) into inhaler and inhale daily. 04/21/18   Doles-Johnson, Teah, NP  triamcinolone cream (KENALOG) 0.1 % Apply 1  application topically 2 (two) times daily. Apply to rash sparingly twice a day Patient not taking: Reported on 06/15/2018 05/04/18   Tawni Millers, MD      PHYSICAL EXAMINATION:   VITAL SIGNS: Blood pressure 120/70, pulse (!) 55, temperature 98.7 F (37.1 C), resp. rate 16, height 5' 5.5" (1.664 m), weight 81.6 kg, SpO2 96 %.  GENERAL:  62 y.o.-year-old patient lying in the bed with no acute distress.  EYES: Pupils equal, round, reactive to light and accommodation. No scleral icterus. Extraocular muscles intact.  HEENT: Head atraumatic, normocephalic. Oropharynx and nasopharynx clear.  NECK:  Supple, no jugular venous distention. No thyroid enlargement, no tenderness.  LUNGS: Normal breath sounds bilaterally, no wheezing, rales,rhonchi or crepitation. No use of accessory muscles of respiration.  CARDIOVASCULAR: S1, S2 normal. No murmurs, rubs, or gallops.  ABDOMEN: Soft, nontender, nondistended. Bowel sounds present. No organomegaly or  mass.  EXTREMITIES: No pedal edema, cyanosis, or clubbing.  NEUROLOGIC: Cranial nerves II through XII are intact. Muscle strength 5/5 in all extremities. Sensation intact. Gait not checked.  PSYCHIATRIC: The patient is alert and oriented x 3.  SKIN: No obvious rash, lesion, or ulcer.   LABORATORY PANEL:   CBC Recent Labs  Lab 06/15/18 1113  WBC 5.9  HGB 13.2  HCT 37.7  PLT 392  MCV 97.4  MCH 34.1*  MCHC 35.0  RDW 14.9*   ------------------------------------------------------------------------------------------------------------------  Chemistries  Recent Labs  Lab 06/15/18 1113  NA 124*  K 4.3  CL 90*  CO2 26  GLUCOSE 95  BUN 8  CREATININE 0.48  CALCIUM 9.0  AST 21  ALT 13  ALKPHOS 136*  BILITOT 0.4   ------------------------------------------------------------------------------------------------------------------ estimated creatinine clearance is 76.8 mL/min (by C-G formula based on SCr of 0.48  mg/dL). ------------------------------------------------------------------------------------------------------------------ No results for input(s): TSH, T4TOTAL, T3FREE, THYROIDAB in the last 72 hours.  Invalid input(s): FREET3   Coagulation profile Recent Labs  Lab 06/15/18 1140  INR 0.89   ------------------------------------------------------------------------------------------------------------------- No results for input(s): DDIMER in the last 72 hours. -------------------------------------------------------------------------------------------------------------------  Cardiac Enzymes No results for input(s): CKMB, TROPONINI, MYOGLOBIN in the last 168 hours.  Invalid input(s): CK ------------------------------------------------------------------------------------------------------------------ Invalid input(s): POCBNP  ---------------------------------------------------------------------------------------------------------------  Urinalysis    Component Value Date/Time   COLORURINE Yellow 04/23/2015   APPEARANCEUR Clear 11/03/2017 1017   LABSPEC 1.017 04/23/2015   GLUCOSEU Negative 11/03/2017 1017   BILIRUBINUR Negative 11/03/2017 1017   PROTEINUR Negative 11/03/2017 1017   NITRITE Negative 11/03/2017 1017   LEUKOCYTESUR Trace (A) 11/03/2017 1017     RADIOLOGY: Ct Angio Head W Or Wo Contrast  Result Date: 06/15/2018 CLINICAL DATA:  Acute onset headache yesterday. EXAM: CT ANGIOGRAPHY HEAD AND NECK TECHNIQUE: Multidetector CT imaging of the head and neck was performed using the standard protocol during bolus administration of intravenous contrast. Multiplanar CT image reconstructions and MIPs were obtained to evaluate the vascular anatomy. Carotid stenosis measurements (when applicable) are obtained utilizing NASCET criteria, using the distal internal carotid diameter as the denominator. CONTRAST:  52mL ISOVUE-370 IOPAMIDOL (ISOVUE-370) INJECTION 76% COMPARISON:  Head CT  from earlier today FINDINGS: CTA NECK FINDINGS Aortic arch: Normal appearance with 3 vessel branching. Right carotid system: Minimal calcified plaque at the common carotid bifurcation. No stenosis or ulceration. Left carotid system: Calcified plaque at the bifurcation without flow limiting stenosis or ulceration. Vertebral arteries: No proximal subclavian stenosis. There is mild left vertebral artery dominance. The vertebral arteries are tortuous without definite beading. Negative for dissection or significant stenosis. Skeleton: Diffuse degenerative disc narrowing with endplate spurring. Right C2-3 facet arthritis with spurring and anterolisthesis. Right foraminal impingement at this level at least. Other neck: No acute inflammation. Upper chest: Ground-glass opacity in the bilateral apical lungs with airway thickening. Review of the MIP images confirms the above findings CTA HEAD FINDINGS Anterior circulation: Atherosclerotic plaque on the bilateral carotid siphons without flow limiting stenosis. Superiorly directed right MCA aneurysm just before the bifurcation. Base to dome is 4.5 mm and maximal transverse span is 3 mm. The aneurysm is somewhat lobulated appearance on MIP images but has a fairly smooth contour on 3D rendering. The shape is conical. A similar outpouching superiorly from the right M1 segment has a vessel emanating from the tip, consistent with infundibulum. Anticipate catheter angiography for further evaluation. No branch occlusion is seen. Posterior circulation: Vertebrobasilar arteries are smooth and widely patent. Negative for stenosis, branch occlusion, beading, or aneurysm. Venous  sinuses: Patent Anatomic variants: None significant Delayed phase: No abnormal intracranial enhancement Review of the MIP images confirms the above findings IMPRESSION: 1. 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 Prior noncontrast head CT was negative for subarachnoid hemorrhage. 2.  Atherosclerosis without flow limiting stenosis. 3. Biapical patchy lung opacity which could be atelectasis or pneumonitis. Electronically Signed   By: Monte Fantasia M.D.   On: 06/15/2018 13:58   Ct Head Wo Contrast  Result Date: 06/15/2018 CLINICAL DATA:  62 year old female with persistent headache since yesterday when the patient experienced an episode of altered mental status. EXAM: CT HEAD WITHOUT CONTRAST TECHNIQUE: Contiguous axial images were obtained from the base of the skull through the vertex without intravenous contrast. COMPARISON:  None. FINDINGS: Brain: Cerebral volume is within normal limits for age. There is patchy hypodensity in the left frontal lobe white matter, most pronounced near the left frontal horn on series 3, image 12. Elsewhere gray-white matter differentiation is normal. No midline shift, ventriculomegaly, mass effect, evidence of mass lesion, intracranial hemorrhage or evidence of cortically based acute infarction. No cortical encephalomalacia. Vascular: Mild Calcified atherosclerosis at the skull base. No suspicious intracranial vascular hyperdensity. Skull: Negative. Sinuses/Orbits: Visualized paranasal sinuses and mastoids are well pneumatized. Other: Visualized orbit soft tissues are within normal limits. No acute scalp soft tissues findings. IMPRESSION: 1. Patchy left frontal lobe white matter hypodensity most commonly due to small vessel disease, age indeterminate. 2. Otherwise negative noncontrast head CT. Electronically Signed   By: Genevie Ann M.D.   On: 06/15/2018 12:32   Ct Angio Neck W And/or Wo Contrast  Result Date: 06/15/2018 CLINICAL DATA:  Acute onset headache yesterday. EXAM: CT ANGIOGRAPHY HEAD AND NECK TECHNIQUE: Multidetector CT imaging of the head and neck was performed using the standard protocol during bolus administration of intravenous contrast. Multiplanar CT image reconstructions and MIPs were obtained to evaluate the vascular anatomy. Carotid stenosis  measurements (when applicable) are obtained utilizing NASCET criteria, using the distal internal carotid diameter as the denominator. CONTRAST:  3mL ISOVUE-370 IOPAMIDOL (ISOVUE-370) INJECTION 76% COMPARISON:  Head CT from earlier today FINDINGS: CTA NECK FINDINGS Aortic arch: Normal appearance with 3 vessel branching. Right carotid system: Minimal calcified plaque at the common carotid bifurcation. No stenosis or ulceration. Left carotid system: Calcified plaque at the bifurcation without flow limiting stenosis or ulceration. Vertebral arteries: No proximal subclavian stenosis. There is mild left vertebral artery dominance. The vertebral arteries are tortuous without definite beading. Negative for dissection or significant stenosis. Skeleton: Diffuse degenerative disc narrowing with endplate spurring. Right C2-3 facet arthritis with spurring and anterolisthesis. Right foraminal impingement at this level at least. Other neck: No acute inflammation. Upper chest: Ground-glass opacity in the bilateral apical lungs with airway thickening. Review of the MIP images confirms the above findings CTA HEAD FINDINGS Anterior circulation: Atherosclerotic plaque on the bilateral carotid siphons without flow limiting stenosis. Superiorly directed right MCA aneurysm just before the bifurcation. Base to dome is 4.5 mm and maximal transverse span is 3 mm. The aneurysm is somewhat lobulated appearance on MIP images but has a fairly smooth contour on 3D rendering. The shape is conical. A similar outpouching superiorly from the right M1 segment has a vessel emanating from the tip, consistent with infundibulum. Anticipate catheter angiography for further evaluation. No branch occlusion is seen. Posterior circulation: Vertebrobasilar arteries are smooth and widely patent. Negative for stenosis, branch occlusion, beading, or aneurysm. Venous sinuses: Patent Anatomic variants: None significant Delayed phase: No  abnormal intracranial  enhancement Review of the MIP images confirms the above findings IMPRESSION: 1. 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 Prior noncontrast head CT was negative for subarachnoid hemorrhage. 2. Atherosclerosis without flow limiting stenosis. 3. Biapical patchy lung opacity which could be atelectasis or pneumonitis. Electronically Signed   By: Monte Fantasia M.D.   On: 06/15/2018 13:58    EKG: Orders placed or performed during the hospital encounter of 06/15/18  . ED EKG  . ED EKG  . EKG 12-Lead  . EKG 12-Lead    IMPRESSION AND PLAN:  *TIA Patient had episodes of confusion. CT head and neck angiogram is done which is negative. We will get MRI on brain and echocardiogram. Consult neurology for further evaluation and check lipid panel and hemoglobin A1c. Patient has allergy to aspirin, I would wait until neurology evaluation and we may need to start on Plavix.  *Hypertension We will continue home medications of amlodipine, atenolol, hydralazine, lisinopril.  *COPD Continue Spiriva.  *Bipolar disorder Continue mood stabilizing medications.  *Intracranial aneurysm ER physician spoke to neurosurgeon, he suggested to do spinal tap to rule out bleed, ER will take care of that.  *Active smoking Counseled to quit smoking for 4 minutes and offered nicotine patch.  All the records are reviewed and case discussed with ED provider. Management plans discussed with the patient, family and they are in agreement.  CODE STATUS: Full.   TOTAL TIME TAKING CARE OF THIS PATIENT: 55 minutes.    Vaughan Basta M.D on 06/15/2018   Between 7am to 6pm - Pager - [phone]  After 6pm go to [email or web address] - password [email or web address] [facility]  Office  [phone]  CC: Primary care physician; Tawni Millers, MD   Note: This dictation was prepared with Dragon dictation along with smaller phrase technology. Any transcriptional errors that result from  this process are unintentional.

## 2018-06-15 NOTE — Consult Note (Signed)
[facility] NAME: Shannon Schroeder    MR#:  [id number]  DATE OF BIRTH:  04/02/1956  DATE OF ADMISSION:  06/15/2018  PRIMARY CARE PHYSICIAN: Tawni Millers, MD   REQUESTING/REFERRING PHYSICIAN: malinda  CHIEF COMPLAINT:      Chief Complaint  Patient presents with  . Altered Mental Status    HISTORY OF PRESENT ILLNESS: Shannon Schroeder  is a 62 y.o. female with a known history of anxiety, bipolar disorder, depression, V. tach, hypertension-currently living in a transition home after coming out of prison since last December.  At baseline she walks without any support and does not have any weakness. Yesterday she had severe headache and she was also very confused and could not do some basic stuff because of her confusion.  She was able to walk and move all her limbs, also denies any associated vision or hearing problems. Her speech was normal but her mild headache continues today so she was brought to emergency room for further work-up.  CT scan of head and neck angiogram is done which is negative for any acute bleed or pathology, have some incidental finding of aneurysm. ER physician suggested to admit to hospitalist team for further management and diagnostic of TIA.  PAST MEDICAL HISTORY:       Past Medical History:  Diagnosis Date  . Anxiety   . Arthritis   . Bipolar disorder ([place])   . Depression   . Dysrhythmia    H/O V TACH  . Hypertension     PAST SURGICAL HISTORY:       Past Surgical History:  Procedure Laterality Date  . ABDOMINAL HYSTERECTOMY     partial  . BREAST SURGERY     biopsy  . CARDIAC CATHETERIZATION     X 2  . CARPECTOMY HAND Right   . CATARACT EXTRACTION W/PHACO Left 03/08/2018   Procedure: CATARACT EXTRACTION PHACO AND INTRAOCULAR LENS PLACEMENT (IOC);  Surgeon: Birder Robson, MD;  Location: [facility] ORS;  Service: Ophthalmology;  Laterality:  Left;  [place] 00:55 AP% 13.5 CDE 7.52 Fluid pack lot # [id number]  . CATARACT EXTRACTION W/PHACO Right 04/05/2018   Procedure: CATARACT EXTRACTION PHACO AND INTRAOCULAR LENS PLACEMENT (IOC);  Surgeon: Birder Robson, MD;  Location: [facility] ORS;  Service: Ophthalmology;  Laterality: Right;  [place] 00:36 AP% 15.9 CDE 5.72 Fluid pack lot # [id number]  . CHOLECYSTECTOMY    . TUBAL LIGATION      SOCIAL HISTORY:  Social History        Tobacco Use  . Smoking status: Current Every Day Smoker    Packs/day: 1.00    Types: Cigarettes  . Smokeless tobacco: Never Used  Substance Use Topics  . Alcohol use: No    Frequency: Never    FAMILY HISTORY:       Family History  Problem Relation Age of Onset  . Hypertension Mother   . Heart failure Mother   . Hypertension Brother   . Stroke Brother   . Hypertension Son   . Emphysema Maternal Aunt   . Hypertension Paternal Aunt   . Hypertension Paternal Uncle   . Hypertension Maternal Grandmother   . Hypertension Paternal Grandmother     DRUG ALLERGIES:      Allergies  Allergen Reactions  . Aspirin Swelling  . Naproxen Swelling and Rash  . Paxil [Paroxetine Hcl] Rash  . Phenobarbital Rash  . Prozac [  Fluoxetine Hcl] Rash    REVIEW OF SYSTEMS:   CONSTITUTIONAL: No fever, fatigue or weakness.  EYES: No blurred or double vision.  EARS, NOSE, AND THROAT: No tinnitus or ear pain.  RESPIRATORY: No cough, shortness of breath, wheezing or hemoptysis.  CARDIOVASCULAR: No chest pain, orthopnea, edema.  GASTROINTESTINAL: No nausea, vomiting, diarrhea or abdominal pain.  GENITOURINARY: No dysuria, hematuria.  ENDOCRINE: No polyuria, nocturia,  HEMATOLOGY: No anemia, easy bruising or bleeding SKIN: No rash or lesion. MUSCULOSKELETAL: No joint pain or arthritis.   NEUROLOGIC: No tingling, numbness, weakness.  PSYCHIATRY: No anxiety or depression.   MEDICATIONS AT HOME:         Prior to Admission medications    Medication Sig Start Date End Date Taking? Authorizing Provider  acetaminophen (TYLENOL) 500 MG tablet Take 500-1,000 mg by mouth every 6 (six) hours as needed for moderate pain or headache.   Yes [provider]  albuterol (PROVENTIL HFA;VENTOLIN HFA) 108 (90 Base) MCG/ACT inhaler Inhale 1-2 puffs into the lungs every 6 (six) hours as needed for wheezing or shortness of breath. 03/31/18  Yes Doles-Johnson, Teah, NP  amLODipine (NORVASC) 5 MG tablet Take 1 tablet (5 mg total) by mouth daily. 05/04/18  Yes Tawni Millers, MD  atenolol (TENORMIN) 25 MG tablet Take 1 tablet (25 mg total) by mouth 2 (two) times daily. Patient taking differently: Take 50 mg by mouth every evening.  11/03/17  Yes Chaplin, Dianah Field, MD  Brexpiprazole (REXULTI) 1 MG TABS Take 1 mg by mouth at bedtime.   Yes [provider]  carbamazepine (TEGRETOL) 200 MG tablet Take 600 mg by mouth at bedtime.    Yes [provider]  hydrALAZINE (APRESOLINE) 25 MG tablet Take 1 tablet (25 mg total) by mouth 3 (three) times daily. 06/02/18  Yes Juluis Pitch, MD  lamoTRIgine (LAMICTAL) 100 MG tablet Take 125 mg by mouth at bedtime.    Yes [provider]  lisinopril (PRINIVIL,ZESTRIL) 40 MG tablet Take 1 tablet (40 mg total) by mouth daily. 04/26/18  Yes Doles-Johnson, Teah, NP  Pseudoeph-Doxylamine-DM-APAP (NYQUIL PO) Take 15 mLs by mouth at bedtime as needed (cold).    Yes [provider]  azithromycin (ZITHROMAX) 250 MG tablet Two tablets day 1 then one tablet daily until gone. Patient not taking: Reported on 05/04/2018 03/31/18   Doles-Johnson, Teah, NP  ibuprofen (ADVIL,MOTRIN) 200 MG tablet Take 400 mg by mouth every 6 (six) hours as needed for headache or moderate pain.    [provider]  miconazole (MICOTIN) 2 % cream Apply 1 application topically 2 (two) times daily. Patient not taking: Reported on 03/24/2018 02/02/18   Tawni Millers, MD  tiotropium (SPIRIVA) 18 MCG  inhalation capsule Place 1 capsule (18 mcg total) into inhaler and inhale daily. 04/21/18   Doles-Johnson, Teah, NP  triamcinolone cream (KENALOG) 0.1 % Apply 1 application topically 2 (two) times daily. Apply to rash sparingly twice a day Patient not taking: Reported on 06/15/2018 05/04/18   Tawni Millers, MD      PHYSICAL EXAMINATION:   VITAL SIGNS: Blood pressure 120/70, pulse (!) 55, temperature 98.7 F (37.1 C), resp. rate 16, height 5' 5.5" (1.664 m), weight 81.6 kg, SpO2 96 %.  GENERAL:  62 y.o.-year-old patient lying in the bed with no acute distress.  EYES: Pupils equal, round, reactive to light and accommodation. No scleral icterus. Extraocular muscles intact.  HEENT: Head atraumatic, normocephalic. Oropharynx and nasopharynx clear.  NECK:  Supple, no jugular venous  distention. No thyroid enlargement, no tenderness.  LUNGS: Normal breath sounds bilaterally, no wheezing, rales,rhonchi or crepitation. No use of accessory muscles of respiration.  CARDIOVASCULAR: S1, S2 normal. No murmurs, rubs, or gallops.  ABDOMEN: Soft, nontender, nondistended. Bowel sounds present. No organomegaly or mass.  EXTREMITIES: No pedal edema, cyanosis, or clubbing.  NEUROLOGIC: Cranial nerves II through XII are intact. Muscle strength 5/5 in all extremities. Sensation intact. Gait not checked.  PSYCHIATRIC: The patient is alert and oriented x 3.  SKIN: No obvious rash, lesion, or ulcer.   LABORATORY PANEL:   CBC LastLabs     Recent Labs  Lab 06/15/18 1113  WBC 5.9  HGB 13.2  HCT 37.7  PLT 392  MCV 97.4  MCH 34.1*  MCHC 35.0  RDW 14.9*     ------------------------------------------------------------------------------------------------------------------  Chemistries  LastLabs     Recent Labs  Lab 06/15/18 1113  NA 124*  K 4.3  CL 90*  CO2 26  GLUCOSE 95  BUN 8  CREATININE 0.48  CALCIUM 9.0  AST 21  ALT 13  ALKPHOS 136*  BILITOT 0.4      ------------------------------------------------------------------------------------------------------------------ estimated creatinine clearance is 76.8 mL/min (by C-G formula based on SCr of 0.48 mg/dL). ------------------------------------------------------------------------------------------------------------------  RecentLabs(last2labs)  No results for input(s): TSH, T4TOTAL, T3FREE, THYROIDAB in the last 72 hours.  Invalid input(s): FREET3     Coagulation profile LastLabs     Recent Labs  Lab 06/15/18 1140  INR 0.89     ------------------------------------------------------------------------------------------------------------------- RecentLabs(last2labs)  No results for input(s): DDIMER in the last 72 hours.   -------------------------------------------------------------------------------------------------------------------  Cardiac Enzymes  LastLabs  No results for input(s): CKMB, TROPONINI, MYOGLOBIN in the last 168 hours.  Invalid input(s): CK   ------------------------------------------------------------------------------------------------------------------ LastLabs  Invalid input(s): POCBNP    ---------------------------------------------------------------------------------------------------------------  Urinalysis Labs(Brief)          Component Value Date/Time   COLORURINE Yellow 04/23/2015   APPEARANCEUR Clear 11/03/2017 1017   LABSPEC 1.017 04/23/2015   GLUCOSEU Negative 11/03/2017 1017   BILIRUBINUR Negative 11/03/2017 1017   PROTEINUR Negative 11/03/2017 1017   NITRITE Negative 11/03/2017 1017   LEUKOCYTESUR Trace (A) 11/03/2017 1017       RADIOLOGY:  ImagingResults(Last48hours)  Ct Angio Head W Or Wo Contrast  Result Date: 06/15/2018 CLINICAL DATA:  Acute onset headache yesterday. EXAM: CT ANGIOGRAPHY HEAD AND NECK TECHNIQUE: Multidetector CT imaging of the head and neck was performed using  the standard protocol during bolus administration of intravenous contrast. Multiplanar CT image reconstructions and MIPs were obtained to evaluate the vascular anatomy. Carotid stenosis measurements (when applicable) are obtained utilizing NASCET criteria, using the distal internal carotid diameter as the denominator. CONTRAST:  65mL ISOVUE-370 IOPAMIDOL (ISOVUE-370) INJECTION 76% COMPARISON:  Head CT from earlier today FINDINGS: CTA NECK FINDINGS Aortic arch: Normal appearance with 3 vessel branching. Right carotid system: Minimal calcified plaque at the common carotid bifurcation. No stenosis or ulceration. Left carotid system: Calcified plaque at the bifurcation without flow limiting stenosis or ulceration. Vertebral arteries: No proximal subclavian stenosis. There is mild left vertebral artery dominance. The vertebral arteries are tortuous without definite beading. Negative for dissection or significant stenosis. Skeleton: Diffuse degenerative disc narrowing with endplate spurring. Right C2-3 facet arthritis with spurring and anterolisthesis. Right foraminal impingement at this level at least. Other neck: No acute inflammation. Upper chest: Ground-glass opacity in the bilateral apical lungs with airway thickening. Review of the MIP images confirms the above findings CTA HEAD FINDINGS Anterior circulation: Atherosclerotic plaque on the bilateral carotid siphons  without flow limiting stenosis. Superiorly directed right MCA aneurysm just before the bifurcation. Base to dome is 4.5 mm and maximal transverse span is 3 mm. The aneurysm is somewhat lobulated appearance on MIP images but has a fairly smooth contour on 3D rendering. The shape is conical. A similar outpouching superiorly from the right M1 segment has a vessel emanating from the tip, consistent with infundibulum. Anticipate catheter angiography for further evaluation. No branch occlusion is seen. Posterior circulation: Vertebrobasilar arteries are smooth  and widely patent. Negative for stenosis, branch occlusion, beading, or aneurysm. Venous sinuses: Patent Anatomic variants: None significant Delayed phase: No abnormal intracranial enhancement Review of the MIP images confirms the above findings IMPRESSION: 1. 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 Prior noncontrast head CT was negative for subarachnoid hemorrhage. 2. Atherosclerosis without flow limiting stenosis. 3. Biapical patchy lung opacity which could be atelectasis or pneumonitis. Electronically Signed   By: Monte Fantasia M.D.   On: 06/15/2018 13:58   Ct Head Wo Contrast  Result Date: 06/15/2018 CLINICAL DATA:  62 year old female with persistent headache since yesterday when the patient experienced an episode of altered mental status. EXAM: CT HEAD WITHOUT CONTRAST TECHNIQUE: Contiguous axial images were obtained from the base of the skull through the vertex without intravenous contrast. COMPARISON:  None. FINDINGS: Brain: Cerebral volume is within normal limits for age. There is patchy hypodensity in the left frontal lobe white matter, most pronounced near the left frontal horn on series 3, image 12. Elsewhere gray-white matter differentiation is normal. No midline shift, ventriculomegaly, mass effect, evidence of mass lesion, intracranial hemorrhage or evidence of cortically based acute infarction. No cortical encephalomalacia. Vascular: Mild Calcified atherosclerosis at the skull base. No suspicious intracranial vascular hyperdensity. Skull: Negative. Sinuses/Orbits: Visualized paranasal sinuses and mastoids are well pneumatized. Other: Visualized orbit soft tissues are within normal limits. No acute scalp soft tissues findings. IMPRESSION: 1. Patchy left frontal lobe white matter hypodensity most commonly due to small vessel disease, age indeterminate. 2. Otherwise negative noncontrast head CT. Electronically Signed   By: Genevie Ann M.D.   On: 06/15/2018 12:32   Ct Angio  Neck W And/or Wo Contrast  Result Date: 06/15/2018 CLINICAL DATA:  Acute onset headache yesterday. EXAM: CT ANGIOGRAPHY HEAD AND NECK TECHNIQUE: Multidetector CT imaging of the head and neck was performed using the standard protocol during bolus administration of intravenous contrast. Multiplanar CT image reconstructions and MIPs were obtained to evaluate the vascular anatomy. Carotid stenosis measurements (when applicable) are obtained utilizing NASCET criteria, using the distal internal carotid diameter as the denominator. CONTRAST:  67mL ISOVUE-370 IOPAMIDOL (ISOVUE-370) INJECTION 76% COMPARISON:  Head CT from earlier today FINDINGS: CTA NECK FINDINGS Aortic arch: Normal appearance with 3 vessel branching. Right carotid system: Minimal calcified plaque at the common carotid bifurcation. No stenosis or ulceration. Left carotid system: Calcified plaque at the bifurcation without flow limiting stenosis or ulceration. Vertebral arteries: No proximal subclavian stenosis. There is mild left vertebral artery dominance. The vertebral arteries are tortuous without definite beading. Negative for dissection or significant stenosis. Skeleton: Diffuse degenerative disc narrowing with endplate spurring. Right C2-3 facet arthritis with spurring and anterolisthesis. Right foraminal impingement at this level at least. Other neck: No acute inflammation. Upper chest: Ground-glass opacity in the bilateral apical lungs with airway thickening. Review of the MIP images confirms the above findings CTA HEAD FINDINGS Anterior circulation: Atherosclerotic plaque on the bilateral carotid siphons without flow limiting stenosis. Superiorly directed right MCA aneurysm  just before the bifurcation. Base to dome is 4.5 mm and maximal transverse span is 3 mm. The aneurysm is somewhat lobulated appearance on MIP images but has a fairly smooth contour on 3D rendering. The shape is conical. A similar outpouching superiorly from the right M1  segment has a vessel emanating from the tip, consistent with infundibulum. Anticipate catheter angiography for further evaluation. No branch occlusion is seen. Posterior circulation: Vertebrobasilar arteries are smooth and widely patent. Negative for stenosis, branch occlusion, beading, or aneurysm. Venous sinuses: Patent Anatomic variants: None significant Delayed phase: No abnormal intracranial enhancement Review of the MIP images confirms the above findings IMPRESSION: 1. 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 Prior noncontrast head CT was negative for subarachnoid hemorrhage. 2. Atherosclerosis without flow limiting stenosis. 3. Biapical patchy lung opacity which could be atelectasis or pneumonitis. Electronically Signed   By: Monte Fantasia M.D.   On: 06/15/2018 13:58     EKG:    Orders placed or performed during the hospital encounter of 06/15/18  . ED EKG  . ED EKG  . EKG 12-Lead  . EKG 12-Lead    IMPRESSION AND PLAN:  *TIA Patient had episodes of confusion. CT head and neck angiogram is done which is negative. We will get MRI on brain and echocardiogram. Consult neurology for further evaluation and check lipid panel and hemoglobin A1c. Patient has allergy to aspirin, I would wait until neurology evaluation and we may need to start on Plavix.  *Hypertension We will continue home medications of amlodipine, atenolol, hydralazine, lisinopril.  *COPD Continue Spiriva.  *Bipolar disorder Continue mood stabilizing medications.  *Intracranial aneurysm ER physician spoke to neurosurgeon, he suggested to do spinal tap to rule out bleed, ER will take care of that.  We will decide- admission vs ER transfer to [facility] after that.  *Active smoking Counseled to quit smoking for 4 minutes and offered nicotine patch.  All the records are reviewed and case discussed with ED provider. Management plans discussed with the patient, family and they are in  agreement.  CODE STATUS: Full.   TOTAL TIME TAKING CARE OF THIS PATIENT: 55 minutes.    Vaughan Basta M.D on 06/15/2018   Between 7am to 6pm - Pager - [phone]  After 6pm go to [email or web address] - password [email or web address] [facility]  Office  [phone]  CC: Primary care physician; Tawni Millers, MD   Note: This dictation was prepared with Dragon dictation along with smaller phrase technology. Any transcriptional errors that result from this process are unintentional.

## 2018-06-15 NOTE — ED Triage Notes (Signed)
Pt arrived via POV from home with daughter. Pt states that yesterday she felt "stuck" and "lost time" yesterday around 3pm. Pt states she wasn't able to move or talk or concentrate on anything. Pt states after this episode which she said lasted only a few minutes she has had a headache since. Pt states the headache is a dull ache and rates the pain 7/10.    Daughter states that she has been slower to respond since yesterday. Pt states last night she was trying to fill out an application for school and could not figure it out.  Pt has no hx of CVA, TIA

## 2018-06-15 NOTE — Progress Notes (Signed)
LP done- have RBCs in there. ER physician spoke to Dr. Cari Caraway and They accepted her to be transferred to [facility] Neuro surgery.

## 2018-06-15 NOTE — ED Provider Notes (Addendum)
[facility] Emergency Department Provider Note   ____________________________________________   First MD Initiated Contact with Patient 06/15/18 1133     (approximate)  I have reviewed the triage vital signs and the nursing notes.   HISTORY  Chief Complaint Altered Mental Status    HPI Lakeesha Fontanilla is a 62 y.o. female reports yesterday at 3:00 she became disoriented had some left-sided weakness and developed sudden onset of a bad headache in the left occiput and parietal area.  She still reports slight left-sided weakness headache 7 out of 10 and a fog which is decreasing her mental capacity.   Past Medical History:  Diagnosis Date  . Anxiety   . Arthritis   . Bipolar disorder ([place])   . Depression   . Dysrhythmia    H/O V TACH  . Hypertension     Patient Active Problem List   Diagnosis Date Noted  . Tobacco abuse 06/02/2018  . COPD (chronic obstructive pulmonary disease) ([place]) 04/21/2018  . URI (upper respiratory infection) 03/31/2018  . Hypertension 03/31/2018  . Tachycardia 02/02/2018  . Rash of body 02/02/2018  . Hyponatremia 02/02/2018    Past Surgical History:  Procedure Laterality Date  . ABDOMINAL HYSTERECTOMY     partial  . BREAST SURGERY     biopsy  . CARDIAC CATHETERIZATION     X 2  . CARPECTOMY HAND Right   . CATARACT EXTRACTION W/PHACO Left 03/08/2018   Procedure: CATARACT EXTRACTION PHACO AND INTRAOCULAR LENS PLACEMENT (IOC);  Surgeon: Birder Robson, MD;  Location: [facility] ORS;  Service: Ophthalmology;  Laterality: Left;  [place] 00:55 AP% 13.5 CDE 7.52 Fluid pack lot # [id number]  . CATARACT EXTRACTION W/PHACO Right 04/05/2018   Procedure: CATARACT EXTRACTION PHACO AND INTRAOCULAR LENS PLACEMENT (IOC);  Surgeon: Birder Robson, MD;  Location: [facility] ORS;  Service: Ophthalmology;  Laterality: Right;  [place] 00:36 AP% 15.9 CDE 5.72 Fluid pack lot # [id number]  . CHOLECYSTECTOMY    . TUBAL LIGATION      Prior to Admission  medications   Medication Sig Start Date End Date Taking? Authorizing Provider  acetaminophen (TYLENOL) 500 MG tablet Take 500-1,000 mg by mouth every 6 (six) hours as needed for moderate pain or headache.    [provider]  albuterol (PROVENTIL HFA;VENTOLIN HFA) 108 (90 Base) MCG/ACT inhaler Inhale 1-2 puffs into the lungs every 6 (six) hours as needed for wheezing or shortness of breath. 03/31/18   Doles-Johnson, Teah, NP  amLODipine (NORVASC) 5 MG tablet Take 1 tablet (5 mg total) by mouth daily. 05/04/18   Tawni Millers, MD  atenolol (TENORMIN) 25 MG tablet Take 1 tablet (25 mg total) by mouth 2 (two) times daily. 11/03/17   Tawni Millers, MD  azithromycin (ZITHROMAX) 250 MG tablet Two tablets day 1 then one tablet daily until gone. Patient not taking: Reported on 05/04/2018 03/31/18   Doles-Johnson, Teah, NP  Brexpiprazole (REXULTI) 1 MG TABS Take 1 mg by mouth at bedtime.    [provider]  carbamazepine (TEGRETOL) 200 MG tablet Take 600 mg by mouth at bedtime.     [provider]  Difluprednate (DUREZOL OP) Place 1 drop into the left eye 2 (two) times daily.    [provider]  diphenhydrAMINE (BENADRYL) 25 MG tablet Take 25 mg by mouth 2 (two) times daily.    [provider]  hydrALAZINE (APRESOLINE) 25 MG tablet Take 1 tablet (25 mg total) by mouth 3 (three) times daily. 06/02/18   Bronstein,  Shanon Brow, MD  ibuprofen (ADVIL,MOTRIN) 200 MG tablet Take 400 mg by mouth every 6 (six) hours as needed for headache or moderate pain.    [provider]  lamoTRIgine (LAMICTAL) 100 MG tablet Take 125 mg by mouth daily.    [provider]  lisinopril (PRINIVIL,ZESTRIL) 40 MG tablet Take 1 tablet (40 mg total) by mouth daily. 04/26/18   Doles-Johnson, Teah, NP  miconazole (MICOTIN) 2 % cream Apply 1 application topically 2 (two) times daily. Patient not taking: Reported on 03/24/2018 02/02/18   Tawni Millers, MD  Pseudoeph-Doxylamine-DM-APAP (NYQUIL PO)  Take 15 mLs by mouth at bedtime as needed (cold).     [provider]  tiotropium (SPIRIVA) 18 MCG inhalation capsule Place 1 capsule (18 mcg total) into inhaler and inhale daily. 04/21/18   Doles-Johnson, Teah, NP  triamcinolone cream (KENALOG) 0.1 % Apply 1 application topically 2 (two) times daily. Apply to rash sparingly twice a day 05/04/18   Tawni Millers, MD    Allergies Aspirin; Naproxen; Paxil [paroxetine hcl]; Phenobarbital; and Prozac [fluoxetine hcl]  Family History  Problem Relation Age of Onset  . Hypertension Mother   . Heart failure Mother   . Hypertension Brother   . Stroke Brother   . Hypertension Son   . Emphysema Maternal Aunt   . Hypertension Paternal Aunt   . Hypertension Paternal Uncle   . Hypertension Maternal Grandmother   . Hypertension Paternal Grandmother     Social History Social History   Tobacco Use  . Smoking status: Current Every Day Smoker    Packs/day: 1.00    Types: Cigarettes  . Smokeless tobacco: Never Used  Substance Use Topics  . Alcohol use: No    Frequency: Never  . Drug use: No    Review of Systems  Constitutional: No fever/chills Eyes: No visual changes. ENT: No sore throat. Cardiovascular: Denies chest pain. Respiratory: Denies shortness of breath. Gastrointestinal: No abdominal pain.  No nausea, no vomiting.  No diarrhea.  No constipation. Genitourinary: Negative for dysuria. Musculoskeletal: Negative for back pain. Skin: Negative for rash. Neurological: See HPI  ____________________________________________   PHYSICAL EXAM:  VITAL SIGNS: ED Triage Vitals  Enc Vitals Group     BP 06/15/18 1101 (!) 173/71     Pulse Rate 06/15/18 1101 64     Resp 06/15/18 1101 16     Temp 06/15/18 1101 98.2 F (36.8 C)     Temp Source 06/15/18 1101 Oral     SpO2 06/15/18 1101 98 %     Weight 06/15/18 1101 180 lb (81.6 kg)     Height 06/15/18 1101 5' 5.5" (1.664 m)     Head Circumference --      Peak Flow --      Pain  Score 06/15/18 1100 7     Pain Loc --      Pain Edu? --      Excl. in [place]? --     Constitutional: Alert and oriented. Well appearing and in no acute distress. Eyes: Conjunctivae are normal. PERRL. EOMI. fundi look normal Head: Atraumatic. Nose: No congestion/rhinnorhea. Mouth/Throat: Mucous membranes are moist.  Oropharynx non-erythematous. Neck: No stridor.  Neck is not stiff but slightly achy cardiovascular: Normal rate, regular rhythm. Grossly normal heart sounds.  Good peripheral circulation. Respiratory: Normal respiratory effort.  No retractions. Lungs CTAB. Gastrointestinal: Soft and nontender. No distention. No abdominal bruits. No CVA tenderness. Musculoskeletal: No lower extremity tenderness nor edema.  No joint effusions. Neurologic:  Normal  speech and language. No gross focal neurologic deficits are appreciated.  Cranial nerves II through XII are intact although visual fields were not checked cerebellar finger-nose, heel-to-shin and rapid alternating movements are normal motor strength is 5/5 throughout.  Does not complain of numbness Skin:  Skin is warm, dry and intact. No rash noted. Psychiatric: Mood and affect are normal. Speech and behavior are normal.  ____________________________________________   LABS (all labs ordered are listed, but only abnormal results are displayed)  Labs Reviewed  COMPREHENSIVE METABOLIC PANEL - Abnormal; Notable for the following components:      Result Value   Sodium 124 (*)    Chloride 90 (*)    Alkaline Phosphatase 136 (*)    All other components within normal limits  CBC - Abnormal; Notable for the following components:   MCH 34.1 (*)    RDW 14.9 (*)    All other components within normal limits  GLUCOSE, CAPILLARY  PROTIME-INR  CBG MONITORING, ED   ____________________________________________  EKG KG read interpreted by me shows sinus bradycardia rate of 57 normal axis no acute ST-T  changes  ____________________________________________  RADIOLOGY  CT the head read as negative for any acute findings by radiology CT Angie of the head is also negative  Official radiology report(s): Ct Head Wo Contrast  Result Date: 06/15/2018 CLINICAL DATA:  62 year old female with persistent headache since yesterday when the patient experienced an episode of altered mental status. EXAM: CT HEAD WITHOUT CONTRAST TECHNIQUE: Contiguous axial images were obtained from the base of the skull through the vertex without intravenous contrast. COMPARISON:  None. FINDINGS: Brain: Cerebral volume is within normal limits for age. There is patchy hypodensity in the left frontal lobe white matter, most pronounced near the left frontal horn on series 3, image 12. Elsewhere gray-white matter differentiation is normal. No midline shift, ventriculomegaly, mass effect, evidence of mass lesion, intracranial hemorrhage or evidence of cortically based acute infarction. No cortical encephalomalacia. Vascular: Mild Calcified atherosclerosis at the skull base. No suspicious intracranial vascular hyperdensity. Skull: Negative. Sinuses/Orbits: Visualized paranasal sinuses and mastoids are well pneumatized. Other: Visualized orbit soft tissues are within normal limits. No acute scalp soft tissues findings. IMPRESSION: 1. Patchy left frontal lobe white matter hypodensity most commonly due to small vessel disease, age indeterminate. 2. Otherwise negative noncontrast head CT. Electronically Signed   By: Genevie Ann M.D.   On: 06/15/2018 12:32    ____________________________________________   PROCEDURES  Procedure(s) performed: Consent obtained for spinal tap discussed with the patient the risk factors bleeding infection worse headache numbness or leg which usually goes away quickly risk factors are missing a subarachnoid hemorrhage which could lead to brain damage or death patient agrees to spinal tap patient's prepped and draped  in usual sterile fashion lidocaine is injected into the area identified and needle is advanced unfortunately after 3 attempts I was unable to get in therefore I stopped will call radiology to get a fluoroscopic guided tap.  Procedures  Critical Care performed:  Critical care time half an hour this includes discussing patient with hospitalist Dr. Doy Mince and reviewing the studies as well as evaluating the patient. ____________________________________________   INITIAL IMPRESSION / [place] / ED COURSE  Discussed with Dr. Doy Mince plan to admit patient for stroke work-up as she is feeling left-sided weakness and we will have to evaluate and treat her hyponatremia as well.    Clinical Course as of Jun 15 1352  Wed Jun 15, 2018  1345 MCV: 97.4 [  PM]    Clinical Course User Index [PM] Nena Polio, MD     ____________________________________________   FINAL CLINICAL IMPRESSION(S) / ED DIAGNOSES  Final diagnoses:  Cerebrovascular accident (CVA), unspecified mechanism ([place])  Hyponatremia     ED Discharge Orders    None       Note:  This document was prepared using Dragon voice recognition software and may include unintentional dictation errors.    Nena Polio, MD 06/15/18 1353    Nena Polio, MD 06/15/18 [id number]

## 2018-06-15 NOTE — ED Notes (Signed)
First nurse note: confusion began yesterday afternoon.

## 2018-06-15 NOTE — ED Provider Notes (Signed)
-----------------------------------------   7:19 PM on 06/15/2018 -----------------------------------------  CSF results reviewed, I have paged Dr. Cari Caraway of neurosurgery to discuss at this time.  Patient remains awake alert.  Vitals:   06/15/18 1800 06/15/18 1830  BP: (!) 161/75 (!) 152/78  Pulse: (!) 59 (!) 56  Resp: 14 15  Temp:    SpO2: 97% 98%      Delman Kitten, MD 06/15/18 1919

## 2018-06-15 NOTE — ED Notes (Signed)
Discussed with Dr. Jimmye Norman, ok to order CT head non-contrast.

## 2018-06-15 NOTE — ED Notes (Signed)
Called for EMS and spoke with Jarrett Soho, per Dr. Jacqualine Code - NO DELAY Transport.

## 2018-06-15 NOTE — Procedures (Signed)
LP under fluoro:         20 g spinal needle inserted at L 3-4      OP    7.5 cm water      CL    3.2 cm water  Obtained 17 ml CSF :     Initially moderately sanguinous, with gradual clearing.   Minimally sanguinous toward end of procedure.          No immed comp.

## 2018-06-15 NOTE — ED Provider Notes (Signed)
Patient accepted by Dr. Rubye Oaks of [facility] to the neuro ICU.  Patient alert and oriented.   [facility] unable to provide transport for another 4 hours, [facility] appears appropriate. Patient alert, oriented, BP < 160 as advised for goal by Dr. Cari Caraway and antiepileptic given.   Stable for transport to [facility] Neuro ICU.  Patient resting comfortably in agreement with plan.  Benefits and risks of transport understood by patient and family.    Delman Kitten, MD 06/15/18 2119

## 2018-06-15 NOTE — Progress Notes (Signed)
Neuro surgeon had seen the pt and suggested fluro guided LP to r/o aneurysmal bleed- if so- she will need to be transferred to [facility]. If NO bleed, then we can monitor here for our further stroke work ups. I will wait until LP done before getting pt on medical service.

## 2018-06-15 NOTE — ED Notes (Signed)
EDP, Elmo Putt, RN, and Carney Harder, RN at bedside for LP.

## 2018-06-15 NOTE — Consult Note (Signed)
Referring Physician:  No referring provider defined for this encounter.  Primary Physician:  Tawni Millers, MD  Chief Complaint:  headache  History of Present Illness: 06/15/2018 Shannon Schroeder is a 62 y.o. female who presents with the chief complaint of headache.  This occurred yesterday around 3 pm.  She reports relatively sudden onset of a severe, left-sided headache.  This was not associated with trauma or any other inciting event.  She also notes that she was dazed for a period of time, though she is now back to normal.  She reports some neck stiffness, and continues to have a headache.  Otherwise, she feels that her neurological status is back to normal.  She denies HA, N, V, change in vision, seizure, or weakness.  Review of Systems:  A 10 point review of systems is negative, except for the pertinent positives and negatives detailed in the HPI.  Past Medical History: Past Medical History:  Diagnosis Date  . Anxiety   . Arthritis   . Bipolar disorder ([place])   . Depression   . Dysrhythmia    H/O V TACH  . Hypertension     Past Surgical History: Past Surgical History:  Procedure Laterality Date  . ABDOMINAL HYSTERECTOMY     partial  . BREAST SURGERY     biopsy  . CARDIAC CATHETERIZATION     X 2  . CARPECTOMY HAND Right   . CATARACT EXTRACTION W/PHACO Left 03/08/2018   Procedure: CATARACT EXTRACTION PHACO AND INTRAOCULAR LENS PLACEMENT (IOC);  Surgeon: Birder Robson, MD;  Location: [facility] ORS;  Service: Ophthalmology;  Laterality: Left;  [place] 00:55 AP% 13.5 CDE 7.52 Fluid pack lot # [id number]  . CATARACT EXTRACTION W/PHACO Right 04/05/2018   Procedure: CATARACT EXTRACTION PHACO AND INTRAOCULAR LENS PLACEMENT (IOC);  Surgeon: Birder Robson, MD;  Location: [facility] ORS;  Service: Ophthalmology;  Laterality: Right;  [place] 00:36 AP% 15.9 CDE 5.72 Fluid pack lot # [id number] H  . CHOLECYSTECTOMY    . TUBAL LIGATION      Allergies: Allergies as of 06/15/2018 - Review  Complete 06/15/2018  Allergen Reaction Noted  . Aspirin Swelling 11/03/2017  . Naproxen Swelling and Rash 11/03/2017  . Paxil [paroxetine hcl] Rash 11/03/2017  . Phenobarbital Rash 11/03/2017  . Prozac [fluoxetine hcl] Rash 11/03/2017    Medications:  Current Facility-Administered Medications:  .  acetaminophen (TYLENOL) tablet 650 mg, 650 mg, Oral, Q4H PRN, Lowella Grip III, MD .  lidocaine (PF) (XYLOCAINE) 1 % injection 10 mL, 10 mL, Other, Once, Nena Polio, MD  Current Outpatient Medications:  .  acetaminophen (TYLENOL) 500 MG tablet, Take 500-1,000 mg by mouth every 6 (six) hours as needed for moderate pain or headache., Disp: , Rfl:  .  albuterol (PROVENTIL HFA;VENTOLIN HFA) 108 (90 Base) MCG/ACT inhaler, Inhale 1-2 puffs into the lungs every 6 (six) hours as needed for wheezing or shortness of breath., Disp: 1 Inhaler, Rfl: 2 .  amLODipine (NORVASC) 5 MG tablet, Take 1 tablet (5 mg total) by mouth daily., Disp: 90 tablet, Rfl: 3 .  atenolol (TENORMIN) 25 MG tablet, Take 1 tablet (25 mg total) by mouth 2 (two) times daily. (Patient taking differently: Take 50 mg by mouth every evening. ), Disp: 180 tablet, Rfl: 3 .  Brexpiprazole (REXULTI) 1 MG TABS, Take 1 mg by mouth at bedtime., Disp: , Rfl:  .  carbamazepine (TEGRETOL) 200 MG tablet, Take 600 mg by mouth at bedtime. , Disp: , Rfl:  .  hydrALAZINE (APRESOLINE) 25 MG  tablet, Take 1 tablet (25 mg total) by mouth 3 (three) times daily., Disp: 90 tablet, Rfl: 1 .  lamoTRIgine (LAMICTAL) 100 MG tablet, Take 125 mg by mouth at bedtime. , Disp: , Rfl:  .  lisinopril (PRINIVIL,ZESTRIL) 40 MG tablet, Take 1 tablet (40 mg total) by mouth daily., Disp: 30 tablet, Rfl: 3 .  Pseudoeph-Doxylamine-DM-APAP (NYQUIL PO), Take 15 mLs by mouth at bedtime as needed (cold). , Disp: , Rfl:  .  azithromycin (ZITHROMAX) 250 MG tablet, Two tablets day 1 then one tablet daily until gone. (Patient not taking: Reported on 05/04/2018), Disp: 6 each, Rfl:  0 .  ibuprofen (ADVIL,MOTRIN) 200 MG tablet, Take 400 mg by mouth every 6 (six) hours as needed for headache or moderate pain., Disp: , Rfl:  .  miconazole (MICOTIN) 2 % cream, Apply 1 application topically 2 (two) times daily. (Patient not taking: Reported on 03/24/2018), Disp: 30 g, Rfl: 0 .  tiotropium (SPIRIVA) 18 MCG inhalation capsule, Place 1 capsule (18 mcg total) into inhaler and inhale daily., Disp: 30 capsule, Rfl: 12 .  triamcinolone cream (KENALOG) 0.1 %, Apply 1 application topically 2 (two) times daily. Apply to rash sparingly twice a day (Patient not taking: Reported on 06/15/2018), Disp: 15 g, Rfl: 0   Social History: Social History   Tobacco Use  . Smoking status: Current Every Day Smoker    Packs/day: 1.00    Types: Cigarettes  . Smokeless tobacco: Never Used  Substance Use Topics  . Alcohol use: No    Frequency: Never  . Drug use: No    Family Medical History: Family History  Problem Relation Age of Onset  . Hypertension Mother   . Heart failure Mother   . Hypertension Brother   . Stroke Brother   . Hypertension Son   . Emphysema Maternal Aunt   . Hypertension Paternal Aunt   . Hypertension Paternal Uncle   . Hypertension Maternal Grandmother   . Hypertension Paternal Grandmother     Physical Examination: Vitals:   06/15/18 2100 06/15/18 2118  BP: (!) 158/82 (!) 158/82  Pulse: (!) 56 78  Resp:  17  Temp:  98.8 F (37.1 C)  SpO2: 96% 98%     General: Patient is well developed, well nourished, calm, collected, and in no apparent distress.  Psychiatric: Patient is non-anxious.  Head:  Pupils equal, round, and reactive to light.  ENT:  Oral mucosa appears well hydrated.  Neck:   Supple.  Full range of motion.  Respiratory: Patient is breathing without any difficulty.  Extremities: No edema.  Vascular: Palpable pulses in dorsal pedal vessels.  Skin:   On exposed skin, there are no abnormal skin lesions.  NEUROLOGICAL:  General: In no  acute distress.   Awake, alert, oriented to person, place, and time.  Pupils equal round and reactive to light.  Facial tone is symmetric.  Tongue protrusion is midline.  There is no pronator drift.  Speech clear and fluent.  Strength: Side Biceps Triceps Deltoid Interossei Grip Wrist Ext. Wrist Flex.  R 5 5 5 5 5 5 5   L 5 5 5 5 5 5 5    Side Iliopsoas Quads Hamstring PF DF EHL  R 5 5 5 5 5 5   L 5 5 5 5 5 5    Reflexes are 1+ and symmetric at the biceps, triceps, brachioradialis, patella and achilles.   Bilateral upper and lower extremity sensation is intact to light touch and pin prick.  Clonus is not  present.  Toes are down-going.  Gait is untested.    Hoffman's is absent.  Imaging: CTA 06/15/2018 IMPRESSION: 1. 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 Prior noncontrast head CT was negative for subarachnoid hemorrhage. 2. Atherosclerosis without flow limiting stenosis. 3. Biapical patchy lung opacity which could be atelectasis or pneumonitis.   Electronically Signed   By: Monte Fantasia M.D.   On: 06/15/2018 13:58   I have personally reviewed the images and agree with the above interpretation.  Assessment and Plan: Ms. Oquendo is a pleasant 62 y.o. 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    I recommend lumbar puncture to evaluate for CT negative SAH.  If she has an acute subarachnoid hemorrhage (as shown on LP), would recommend transfer to [facility] for acute cranial neurovascular services.  If negative, I would recommend headache treatment and evaluation as an outpatient.  If LP shows blood, I would recommend standard SAH treatment with SBP<160, nimodipine 60 mg, and keppra 500 mg.    This plan was discussed with Dr. Cinda Quest in person, as well as the medicine attending who would admit if LP negative.   I answered the patient and her family's questions to the best of my ability.   Haiven Nardone K. Izora Ribas MD, [place] Dept. of  Neurosurgery

## 2018-06-15 NOTE — ED Notes (Signed)
Pt returned from LP at this time.

## 2018-06-15 NOTE — ED Notes (Signed)
MD at beside

## 2018-06-16 DIAGNOSIS — I161 Hypertensive emergency: Secondary | ICD-10-CM | POA: Insufficient documentation

## 2018-06-16 DIAGNOSIS — I609 Nontraumatic subarachnoid hemorrhage, unspecified: Secondary | ICD-10-CM | POA: Insufficient documentation

## 2018-06-25 DIAGNOSIS — G936 Cerebral edema: Secondary | ICD-10-CM | POA: Insufficient documentation

## 2018-06-29 ENCOUNTER — Encounter: Payer: Self-pay | Admitting: Internal Medicine

## 2018-06-29 ENCOUNTER — Ambulatory Visit: Payer: Self-pay | Admitting: Internal Medicine

## 2018-06-29 VITALS — BP 132/75 | HR 70 | Temp 98.1°F | Ht 65.75 in | Wt 176.1 lb

## 2018-06-29 DIAGNOSIS — J302 Other seasonal allergic rhinitis: Secondary | ICD-10-CM

## 2018-06-29 DIAGNOSIS — I671 Cerebral aneurysm, nonruptured: Secondary | ICD-10-CM | POA: Insufficient documentation

## 2018-06-29 DIAGNOSIS — F3160 Bipolar disorder, current episode mixed, unspecified: Secondary | ICD-10-CM | POA: Insufficient documentation

## 2018-06-29 DIAGNOSIS — I10 Essential (primary) hypertension: Secondary | ICD-10-CM

## 2018-06-29 HISTORY — DX: Cerebral aneurysm, nonruptured: I67.1

## 2018-06-29 MED ORDER — LORATADINE 10 MG PO TABS: 10.0000 mg | ORAL_TABLET | Freq: Every day | ORAL | 0 refills | Status: DC

## 2018-06-29 NOTE — Progress Notes (Signed)
Subjective:    Patient ID: Shannon Schroeder, female    DOB: May 17, 1956, 62 y.o.   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 She will follow up with [facility] for staple removal and evaluation.   Pt is here for follow up for hypertension.    Review of Systems   Patient Active Problem List   Diagnosis Date Noted  . TIA (transient ischemic attack) 06/15/2018  . Tobacco abuse 06/02/2018  . COPD (chronic obstructive pulmonary disease) ([place]) 04/21/2018  . URI (upper respiratory infection) 03/31/2018  . Hypertension 03/31/2018  . Tachycardia 02/02/2018  . Rash of body 02/02/2018  . Hyponatremia 02/02/2018   Allergies as of 06/29/2018      Reactions   Aspirin Swelling   Naproxen Swelling, Rash   Paxil [paroxetine Hcl] Rash   Phenobarbital Rash   Prozac [fluoxetine Hcl] Rash      Medication List        Accurate as of 06/29/18 11:31 AM. Always use your most recent med list.          acetaminophen 500 MG tablet Commonly known as:  TYLENOL Take 500-1,000 mg by mouth every 6 (six) hours as needed for moderate pain or headache.   albuterol 108 (90 Base) MCG/ACT inhaler Commonly known as:  PROVENTIL HFA;VENTOLIN HFA Inhale 1-2 puffs into the lungs every 6 (six) hours as needed for wheezing or shortness of breath.   amLODipine 5 MG tablet Commonly known as:  NORVASC Take 1 tablet (5 mg total) by mouth daily.   atenolol 25 MG tablet Commonly known as:  TENORMIN Take 1 tablet (25 mg total) by mouth 2 (two) times daily.   azithromycin 250 MG tablet Commonly known as:  ZITHROMAX Two tablets day 1 then one tablet daily until gone.   carbamazepine 200 MG tablet Commonly known as:  TEGRETOL Take 600 mg by mouth at bedtime.   hydrALAZINE 25 MG tablet Commonly known as:  APRESOLINE Take 1 tablet (25 mg total) by mouth 3 (three) times daily.   ibuprofen 200 MG tablet Commonly known as:  ADVIL,MOTRIN Take 400 mg by mouth every 6 (six) hours as needed for  headache or moderate pain.   LAMICTAL 100 MG tablet Generic drug:  lamoTRIgine Take 125 mg by mouth at bedtime.   lisinopril 40 MG tablet Commonly known as:  PRINIVIL,ZESTRIL Take 1 tablet (40 mg total) by mouth daily.   miconazole 2 % cream Commonly known as:  MICOTIN Apply 1 application topically 2 (two) times daily.   NYQUIL PO Take 15 mLs by mouth at bedtime as needed (cold).   REXULTI 1 MG Tabs Generic drug:  Brexpiprazole Take 1 mg by mouth at bedtime.   tiotropium 18 MCG inhalation capsule Commonly known as:  SPIRIVA Place 1 capsule (18 mcg total) into inhaler and inhale daily.   triamcinolone cream 0.1 % Commonly known as:  KENALOG Apply 1 application topically 2 (two) times daily. Apply to rash sparingly twice a day           Objective:   Physical Exam  Constitutional: She is oriented to person, place, and time.  Cardiovascular: Normal rate, regular rhythm and normal heart sounds.  Pulmonary/Chest: Effort normal and breath sounds normal.  Neurological: She is alert and oriented to person, place, and time.    BP 132/75   Pulse 70   Temp 98.1 F (36.7 C) (Oral)   Ht 5' 5.75" (1.67 m)   Wt 176 lb 1.6  oz (79.9 kg)   BMI 28.64 kg/m    Pt has developed L nostril sniffle today; exam does not appear to have significant drainage in L nostril; mucousa appears to be moist in L nostril.   R nostril appears to be dry.     Assessment & Plan:   Pt has follow up at [facility] for post-aneurysm surgery in a few days for staple removal from scalp. If nasal drainage persists, she will have them check for post-op complications.   In th meantime, going to prescribe loratadine 10 mg daily for allergy.  Return in 3 months with labs a week prior.

## 2018-09-13 ENCOUNTER — Other Ambulatory Visit: Payer: Self-pay | Admitting: Internal Medicine

## 2018-09-21 ENCOUNTER — Other Ambulatory Visit: Payer: Self-pay

## 2018-09-21 DIAGNOSIS — F3160 Bipolar disorder, current episode mixed, unspecified: Secondary | ICD-10-CM

## 2018-09-21 DIAGNOSIS — I10 Essential (primary) hypertension: Secondary | ICD-10-CM

## 2018-09-23 LAB — CBC
HEMATOCRIT: 36.1 % (ref 34.0–46.6)
HEMOGLOBIN: 12.3 g/dL (ref 11.1–15.9)
MCH: 33.2 pg — AB (ref 26.6–33.0)
MCHC: 34.1 g/dL (ref 31.5–35.7)
MCV: 98 fL — AB (ref 79–97)
Platelets: 345 10*3/uL (ref 150–450)
RBC: 3.7 x10E6/uL — ABNORMAL LOW (ref 3.77–5.28)
RDW: 13.7 % (ref 12.3–15.4)
WBC: 6 10*3/uL (ref 3.4–10.8)

## 2018-09-23 LAB — COMPREHENSIVE METABOLIC PANEL
A/G RATIO: 1.8 (ref 1.2–2.2)
ALT: 15 IU/L (ref 0–32)
AST: 14 IU/L (ref 0–40)
Albumin: 4.3 g/dL (ref 3.6–4.8)
Alkaline Phosphatase: 150 IU/L — ABNORMAL HIGH (ref 39–117)
BUN/Creatinine Ratio: 19 (ref 12–28)
BUN: 13 mg/dL (ref 8–27)
CHLORIDE: 102 mmol/L (ref 96–106)
CO2: 23 mmol/L (ref 20–29)
Calcium: 9.6 mg/dL (ref 8.7–10.3)
Creatinine, Ser: 0.68 mg/dL (ref 0.57–1.00)
GFR calc Af Amer: 108 mL/min/{1.73_m2} (ref >59)
GFR calc non Af Amer: 93 mL/min/{1.73_m2} (ref >59)
GLOBULIN, TOTAL: 2.4 g/dL (ref 1.5–4.5)
GLUCOSE: 89 mg/dL (ref 65–99)
POTASSIUM: 5.8 mmol/L — AB (ref 3.5–5.2)
SODIUM: 142 mmol/L (ref 134–144)
Total Protein: 6.7 g/dL (ref 6.0–8.5)

## 2018-09-23 LAB — LIPID PANEL
CHOL/HDL RATIO: 2.2 ratio (ref 0.0–4.4)
Cholesterol, Total: 216 mg/dL — ABNORMAL HIGH (ref 100–199)
HDL: 99 mg/dL (ref >39)
LDL Calculated: 97 mg/dL (ref 0–99)
TRIGLYCERIDES: 98 mg/dL (ref 0–149)
VLDL Cholesterol Cal: 20 mg/dL (ref 5–40)

## 2018-09-23 LAB — TSH: TSH: 2.11 u[IU]/mL (ref 0.450–4.500)

## 2018-09-23 LAB — URINALYSIS
Bilirubin, UA: NEGATIVE
Glucose, UA: NEGATIVE
KETONES UA: NEGATIVE
Leukocytes, UA: NEGATIVE
NITRITE UA: NEGATIVE
Protein, UA: NEGATIVE
RBC, UA: NEGATIVE
Specific Gravity, UA: 1.022 (ref 1.005–1.030)
UUROB: 0.2 mg/dL (ref 0.2–1.0)
pH, UA: 7 (ref 5.0–7.5)

## 2018-09-23 LAB — LAMOTRIGINE LEVEL: LAMOTRIGINE LVL: NOT DETECTED ug/mL (ref 2.0–20.0)

## 2018-09-28 ENCOUNTER — Ambulatory Visit: Payer: Self-pay | Admitting: Internal Medicine

## 2018-09-28 ENCOUNTER — Encounter: Payer: Self-pay | Admitting: Internal Medicine

## 2018-09-28 ENCOUNTER — Other Ambulatory Visit: Payer: Self-pay

## 2018-09-28 VITALS — BP 149/83 | HR 60 | Temp 97.4°F | Wt 183.6 lb

## 2018-09-28 DIAGNOSIS — Z0000 Encounter for general adult medical examination without abnormal findings: Secondary | ICD-10-CM

## 2018-09-28 DIAGNOSIS — M545 Low back pain: Secondary | ICD-10-CM

## 2018-09-28 DIAGNOSIS — G8929 Other chronic pain: Secondary | ICD-10-CM

## 2018-09-28 DIAGNOSIS — I10 Essential (primary) hypertension: Secondary | ICD-10-CM

## 2018-09-28 DIAGNOSIS — R899 Unspecified abnormal finding in specimens from other organs, systems and tissues: Secondary | ICD-10-CM

## 2018-09-28 MED ORDER — AMLODIPINE BESYLATE 5 MG PO TABS: 5.0000 mg | ORAL_TABLET | Freq: Every day | ORAL | 3 refills | Status: DC

## 2018-09-28 MED ORDER — LISINOPRIL 40 MG PO TABS: 40.0000 mg | ORAL_TABLET | Freq: Every day | ORAL | 3 refills | Status: DC

## 2018-09-28 MED ORDER — HYDRALAZINE HCL 25 MG PO TABS: 25.0000 mg | ORAL_TABLET | Freq: Three times a day (TID) | ORAL | 6 refills | Status: DC

## 2018-09-28 MED ORDER — ATENOLOL 25 MG PO TABS: 25.0000 mg | ORAL_TABLET | Freq: Two times a day (BID) | ORAL | 3 refills | Status: DC

## 2018-09-28 MED ORDER — IBUPROFEN 200 MG PO TABS: 400.0000 mg | ORAL_TABLET | Freq: Four times a day (QID) | ORAL | 3 refills | Status: DC | PRN

## 2018-09-28 NOTE — Progress Notes (Signed)
Subjective:    Patient ID: Shannon Schroeder, female    DOB: [birth year], 62 y.o.   MRN: [id number]  HPI   Pt is a 62 yr. old that presents for follow up at [facility]. Pt has been taken off of seizure medication, Lamotrigine. Pt is completely out of Tegretol and Lamictal that she states is related to miscommunication between [facility] and [facility]. Pt would like to stay on medication and has appt.with [facility].    Chief Complaint  Patient presents with  . Follow-up    Review of Systems Patient Active Problem List   Diagnosis Date Noted  . Bipolar affective disorder, current episode mixed ([place]) 06/29/2018  . TIA (transient ischemic attack) 06/15/2018  . Tobacco abuse 06/02/2018  . COPD (chronic obstructive pulmonary disease) ([place]) 04/21/2018  . URI (upper respiratory infection) 03/31/2018  . Hypertension 03/31/2018  . Tachycardia 02/02/2018  . Rash of body 02/02/2018  . Hyponatremia 02/02/2018   Allergies as of 09/28/2018      Reactions   Aspirin Swelling   Naproxen Swelling, Rash   Paxil [paroxetine Hcl] Rash   Phenobarbital Rash   Prozac [fluoxetine Hcl] Rash      Medication List        Accurate as of 09/28/18 10:49 AM. Always use your most recent med list.          acetaminophen 500 MG tablet Commonly known as:  TYLENOL Take 500-1,000 mg by mouth every 6 (six) hours as needed for moderate pain or headache.   albuterol 108 (90 Base) MCG/ACT inhaler Commonly known as:  PROVENTIL HFA;VENTOLIN HFA Inhale 1-2 puffs into the lungs every 6 (six) hours as needed for wheezing or shortness of breath.   amLODipine 5 MG tablet Commonly known as:  NORVASC Take 1 tablet (5 mg total) by mouth daily.   atenolol 25 MG tablet Commonly known as:  TENORMIN Take 1 tablet (25 mg total) by mouth 2 (two) times daily.   azithromycin 250 MG tablet Commonly known as:  ZITHROMAX Two tablets day 1 then one tablet daily until gone.   carbamazepine 200 MG tablet Commonly  known as:  TEGRETOL Take 600 mg by mouth at bedtime.   hydrALAZINE 25 MG tablet Commonly known as:  APRESOLINE TAKE ONE TABLET BY MOUTH 3 TIMES A DAY   ibuprofen 200 MG tablet Commonly known as:  ADVIL,MOTRIN Take 400 mg by mouth every 6 (six) hours as needed for headache or moderate pain.   LAMICTAL 100 MG tablet Generic drug:  lamoTRIgine Take 125 mg by mouth at bedtime.   lisinopril 40 MG tablet Commonly known as:  PRINIVIL,ZESTRIL Take 1 tablet (40 mg total) by mouth daily.   loratadine 10 MG tablet Commonly known as:  CLARITIN Take 1 tablet (10 mg total) by mouth daily for 21 days.   miconazole 2 % cream Commonly known as:  MICOTIN Apply 1 application topically 2 (two) times daily.   NYQUIL PO Take 15 mLs by mouth at bedtime as needed (cold).   REXULTI 1 MG Tabs Generic drug:  Brexpiprazole Take 1 mg by mouth at bedtime.   tiotropium 18 MCG inhalation capsule Commonly known as:  SPIRIVA Place 1 capsule (18 mcg total) into inhaler and inhale daily.   triamcinolone cream 0.1 % Commonly known as:  KENALOG Apply 1 application topically 2 (two) times daily. Apply to rash sparingly twice a day          Objective:   Physical Exam  BP Marland Kitchen)  149/83   Pulse 60   Temp (!) 97.4 F (36.3 C)   Wt 183 lb 9.6 oz (83.3 kg)   BMI 29.86 kg/m        Assessment & Plan:   1. Hypertension, unspecified type Controlled.   Refill Today:  - hydrALAZINE (APRESOLINE) 25 MG tablet; Take 1 tablet (25 mg total) by mouth 3 (three) times daily.  Dispense: 90 tablet; Refill: 6 - atenolol (TENORMIN) 25 MG tablet; Take 1 tablet (25 mg total) by mouth 2 (two) times daily.  Dispense: 180 tablet; Refill: 3 - amLODipine (NORVASC) 5 MG tablet; Take 1 tablet (5 mg total) by mouth daily.  Dispense: 90 tablet; Refill: 3 - lisinopril (PRINIVIL,ZESTRIL) 40 MG tablet; Take 1 tablet (40 mg total) by mouth daily.  Dispense: 90 tablet; Refill: 3  Check in 3 Months:  - Comprehensive metabolic  panel; Future - CBC; Future - Urinalysis; Future - Lipid panel; Future  2. 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   Refill Today:  - ibuprofen (ADVIL,MOTRIN) 200 MG tablet; Take 2 tablets (400 mg total) by mouth every 6 (six) hours as needed for headache or moderate pain.  Dispense: 90 tablet; Refill: 3  3. Abnormal laboratory test result  Pt's alkaline phosphatase was elevated in most recent labs.Suspect secondary to healing of cranial bones related to surgical intervention of cerebral aneurysm.    Check in 3 Months: - Alkaline phosphatase; Future  4. Preventative health care Administer Today:  - Flu Vaccine MDCK QUAD PF   Follow up in 3 months with labs a week prior.

## 2018-11-15 ENCOUNTER — Telehealth: Payer: Self-pay | Admitting: Pharmacist

## 2018-11-15 NOTE — Telephone Encounter (Signed)
11/15/2018 10:06:47 AM - Call to [employer]  11/15/2018 Called [employer] spoke with Alroy Dust to check on patient enrollment-enrolled till 06/10/2019, he verified the correct directions and was able to process today-will ship to patient 5-7 business days.Delos Haring

## 2018-12-07 ENCOUNTER — Telehealth: Payer: Self-pay | Admitting: Pharmacist

## 2018-12-07 NOTE — Telephone Encounter (Signed)
12/07/2018 2:05:08 PM - Ventolin HFA refill  12/07/2018 Placed refill online for Ventolin HFA with [facility], to release 01/09/2019, order# [id number].Shannon Schroeder

## 2018-12-21 ENCOUNTER — Other Ambulatory Visit: Payer: Medicaid Other

## 2018-12-21 DIAGNOSIS — I10 Essential (primary) hypertension: Secondary | ICD-10-CM

## 2018-12-21 DIAGNOSIS — R899 Unspecified abnormal finding in specimens from other organs, systems and tissues: Secondary | ICD-10-CM

## 2018-12-22 LAB — COMPREHENSIVE METABOLIC PANEL
ALBUMIN: 4.4 g/dL (ref 3.8–4.8)
ALK PHOS: 113 IU/L (ref 39–117)
ALT: 9 IU/L (ref 0–32)
AST: 13 IU/L (ref 0–40)
Albumin/Globulin Ratio: 1.9 (ref 1.2–2.2)
BILIRUBIN TOTAL: 0.3 mg/dL (ref 0.0–1.2)
BUN / CREAT RATIO: 20 (ref 12–28)
BUN: 13 mg/dL (ref 8–27)
CO2: 27 mmol/L (ref 20–29)
CREATININE: 0.64 mg/dL (ref 0.57–1.00)
Calcium: 9.3 mg/dL (ref 8.7–10.3)
Chloride: 103 mmol/L (ref 96–106)
GFR calc non Af Amer: 95 mL/min/{1.73_m2} (ref >59)
GFR, EST AFRICAN AMERICAN: 110 mL/min/{1.73_m2} (ref >59)
GLOBULIN, TOTAL: 2.3 g/dL (ref 1.5–4.5)
Glucose: 73 mg/dL (ref 65–99)
Potassium: 4.6 mmol/L (ref 3.5–5.2)
SODIUM: 140 mmol/L (ref 134–144)
Total Protein: 6.7 g/dL (ref 6.0–8.5)

## 2018-12-22 LAB — CBC
HEMATOCRIT: 37.1 % (ref 34.0–46.6)
HEMOGLOBIN: 12.5 g/dL (ref 11.1–15.9)
MCH: 32.9 pg (ref 26.6–33.0)
MCHC: 33.7 g/dL (ref 31.5–35.7)
MCV: 98 fL — ABNORMAL HIGH (ref 79–97)
Platelets: 277 10*3/uL (ref 150–450)
RBC: 3.8 x10E6/uL (ref 3.77–5.28)
RDW: 13.2 % (ref 11.7–15.4)
WBC: 5.1 10*3/uL (ref 3.4–10.8)

## 2018-12-22 LAB — LIPID PANEL
Chol/HDL Ratio: 2.5 ratio (ref 0.0–4.4)
Cholesterol, Total: 180 mg/dL (ref 100–199)
HDL: 71 mg/dL (ref >39)
LDL Calculated: 92 mg/dL (ref 0–99)
TRIGLYCERIDES: 84 mg/dL (ref 0–149)
VLDL Cholesterol Cal: 17 mg/dL (ref 5–40)

## 2018-12-22 LAB — URINALYSIS
Bilirubin, UA: NEGATIVE
Glucose, UA: NEGATIVE
Ketones, UA: NEGATIVE
LEUKOCYTES UA: NEGATIVE
NITRITE UA: NEGATIVE
PH UA: 5 (ref 5.0–7.5)
Protein, UA: NEGATIVE
RBC, UA: NEGATIVE
Specific Gravity, UA: 1.014 (ref 1.005–1.030)
Urobilinogen, Ur: 0.2 mg/dL (ref 0.2–1.0)

## 2018-12-22 LAB — SPECIMEN STATUS REPORT

## 2018-12-28 ENCOUNTER — Encounter: Payer: Self-pay | Admitting: Internal Medicine

## 2018-12-28 ENCOUNTER — Ambulatory Visit: Payer: Medicaid Other | Admitting: Internal Medicine

## 2018-12-28 VITALS — BP 154/84 | HR 66 | Temp 98.1°F | Ht 66.0 in | Wt 186.5 lb

## 2018-12-28 DIAGNOSIS — R197 Diarrhea, unspecified: Secondary | ICD-10-CM

## 2018-12-28 DIAGNOSIS — I10 Essential (primary) hypertension: Secondary | ICD-10-CM

## 2018-12-28 MED ORDER — ATENOLOL 25 MG PO TABS: 25.0000 mg | ORAL_TABLET | Freq: Two times a day (BID) | ORAL | 3 refills | Status: DC

## 2018-12-28 MED ORDER — DICYCLOMINE HCL 20 MG PO TABS: 20.0000 mg | ORAL_TABLET | Freq: Three times a day (TID) | ORAL | 0 refills | Status: DC

## 2018-12-28 NOTE — Progress Notes (Signed)
Subjective:    Patient ID: Shannon Schroeder, female    DOB: 07/06/1956, 63 y.o.   MRN: [id number]  HPI  Patient is a 63 year old female who presents for a follow up for hypertension. Patient reports loose stool for the past 2 months, imodium has not relieved symptoms. Pt experienced loose stool previously and was told she could have IBS. No food appears to trigger symptoms, pt reports quitting caffeine has helped some.    Review of Systems    Patient Active Problem List   Diagnosis Date Noted  . Bipolar affective disorder, current episode mixed ([place]) 06/29/2018  . TIA (transient ischemic attack) 06/15/2018  . Tobacco abuse 06/02/2018  . COPD (chronic obstructive pulmonary disease) ([place]) 04/21/2018  . URI (upper respiratory infection) 03/31/2018  . Hypertension 03/31/2018  . Tachycardia 02/02/2018  . Rash of body 02/02/2018  . Hyponatremia 02/02/2018   Allergies as of 12/28/2018      Reactions   Aspirin Swelling   Naproxen Swelling, Rash   Paxil [paroxetine Hcl] Rash   Phenobarbital Rash   Prozac [fluoxetine Hcl] Rash      Medication List       Accurate as of December 28, 2018 10:04 AM. Always use your most recent med list.        acetaminophen 500 MG tablet Commonly known as:  TYLENOL Take 500-1,000 mg by mouth every 6 (six) hours as needed for moderate pain or headache.   albuterol 108 (90 Base) MCG/ACT inhaler Commonly known as:  PROVENTIL HFA;VENTOLIN HFA Inhale 1-2 puffs into the lungs every 6 (six) hours as needed for wheezing or shortness of breath.   amLODipine 5 MG tablet Commonly known as:  NORVASC Take 1 tablet (5 mg total) by mouth daily.   atenolol 25 MG tablet Commonly known as:  TENORMIN Take 1 tablet (25 mg total) by mouth 2 (two) times daily.   azithromycin 250 MG tablet Commonly known as:  ZITHROMAX Two tablets day 1 then one tablet daily until gone.   carbamazepine 200 MG tablet Commonly known as:  TEGRETOL Take 600 mg by mouth at  bedtime.   hydrALAZINE 25 MG tablet Commonly known as:  APRESOLINE Take 1 tablet (25 mg total) by mouth 3 (three) times daily.   ibuprofen 200 MG tablet Commonly known as:  ADVIL,MOTRIN Take 2 tablets (400 mg total) by mouth every 6 (six) hours as needed for headache or moderate pain.   LAMICTAL 100 MG tablet Generic drug:  lamoTRIgine Take 125 mg by mouth at bedtime.   lisinopril 40 MG tablet Commonly known as:  PRINIVIL,ZESTRIL Take 1 tablet (40 mg total) by mouth daily.   loratadine 10 MG tablet Commonly known as:  CLARITIN Take 1 tablet (10 mg total) by mouth daily for 21 days.   miconazole 2 % cream Commonly known as:  MICOTIN Apply 1 application topically 2 (two) times daily.   NYQUIL PO Take 15 mLs by mouth at bedtime as needed (cold).   REXULTI 1 MG Tabs Generic drug:  Brexpiprazole Take 1 mg by mouth at bedtime.   tiotropium 18 MCG inhalation capsule Commonly known as:  SPIRIVA Place 1 capsule (18 mcg total) into inhaler and inhale daily.   triamcinolone cream 0.1 % Commonly known as:  KENALOG Apply 1 application topically 2 (two) times daily. Apply to rash sparingly twice a day          Objective:   Physical Exam  BP (!) 154/84   Pulse 66  Temp 98.1 F (36.7 C) (Oral)   Ht 5\' 6"  (1.676 m)   Wt 186 lb 8 oz (84.6 kg)   BMI 30.10 kg/m       Assessment & Plan:   1. Hypertension, unspecified type Close to target.   Refill Today:  - atenolol (TENORMIN) 25 MG tablet; Take 1 tablet (25 mg total) by mouth 2 (two) times daily.  Dispense: 180 tablet; Refill: 3  2. Diarrhea, unspecified type Pt has a medical history that is suggestive of irritable bowel syndrome (IBS). Temporarily prescribed Bentyl while stool sample is collected and tested. Suggested that patient refrains from bread and wheat based products.   Start Today:  - dicyclomine (BENTYL) 20 MG tablet; Take 1 tablet (20 mg total) by mouth 4 (four) times daily -  with meals and at bedtime.   Dispense: 120 tablet; Refill: 0  Order Today:  - Ova and parasite examination   RTE in 3-4 weeks to reevaluate symptoms

## 2019-01-03 ENCOUNTER — Other Ambulatory Visit: Payer: Self-pay

## 2019-01-03 DIAGNOSIS — R197 Diarrhea, unspecified: Secondary | ICD-10-CM

## 2019-01-04 ENCOUNTER — Telehealth: Payer: Self-pay | Admitting: Pharmacy Technician

## 2019-01-04 NOTE — Telephone Encounter (Signed)
Received 2020 proof of income.  Patient eligible to receive medication assistance at [facility] as long as eligibility requirements continue to be met.  [facility]

## 2019-01-06 LAB — OVA AND PARASITE EXAMINATION

## 2019-01-17 ENCOUNTER — Other Ambulatory Visit: Payer: Self-pay

## 2019-01-17 DIAGNOSIS — R197 Diarrhea, unspecified: Secondary | ICD-10-CM

## 2019-01-17 MED ORDER — DICYCLOMINE HCL 20 MG PO TABS: 20.0000 mg | ORAL_TABLET | Freq: Three times a day (TID) | ORAL | 0 refills | Status: DC

## 2019-01-18 ENCOUNTER — Encounter: Payer: Self-pay | Admitting: Internal Medicine

## 2019-01-18 ENCOUNTER — Ambulatory Visit: Payer: Medicaid Other | Admitting: Internal Medicine

## 2019-01-18 VITALS — BP 136/73 | HR 67 | Temp 97.9°F | Ht 66.0 in | Wt 185.8 lb

## 2019-01-18 DIAGNOSIS — K529 Noninfective gastroenteritis and colitis, unspecified: Secondary | ICD-10-CM

## 2019-01-18 DIAGNOSIS — I10 Essential (primary) hypertension: Secondary | ICD-10-CM

## 2019-01-18 MED ORDER — DICYCLOMINE HCL 20 MG PO TABS: 20.0000 mg | ORAL_TABLET | Freq: Three times a day (TID) | ORAL | 6 refills | Status: DC

## 2019-01-18 NOTE — Progress Notes (Signed)
Subjective:    Patient ID: Shannon Schroeder, female    DOB: 1956-01-30, 63 y.o.   MRN: [id number]  HPI  Patient is a 63 year old female who presents for a follow up on unspecified diarrhea. Pt states the since starting the Bentyl the urgency of bowel movements improved but not the consistency. Pt has not been taking Hydralazine for the past few weeks, as [facility] would not refill it.    Chief Complaint  Patient presents with  . Follow-up    Review of Systems   Patient Active Problem List   Diagnosis Date Noted  . Bipolar affective disorder, current episode mixed ([place]) 06/29/2018  . TIA (transient ischemic attack) 06/15/2018  . Tobacco abuse 06/02/2018  . COPD (chronic obstructive pulmonary disease) ([place]) 04/21/2018  . URI (upper respiratory infection) 03/31/2018  . Hypertension 03/31/2018  . Tachycardia 02/02/2018  . Rash of body 02/02/2018  . Hyponatremia 02/02/2018   Allergies as of 01/18/2019      Reactions   Aspirin Swelling   Naproxen Swelling, Rash   Paxil [paroxetine Hcl] Rash   Phenobarbital Rash   Prozac [fluoxetine Hcl] Rash      Medication List       Accurate as of January 18, 2019  9:50 AM. Always use your most recent med list.        acetaminophen 500 MG tablet Commonly known as:  TYLENOL Take 500-1,000 mg by mouth every 6 (six) hours as needed for moderate pain or headache.   albuterol 108 (90 Base) MCG/ACT inhaler Commonly known as:  PROVENTIL HFA;VENTOLIN HFA Inhale 1-2 puffs into the lungs every 6 (six) hours as needed for wheezing or shortness of breath.   amLODipine 5 MG tablet Commonly known as:  NORVASC Take 1 tablet (5 mg total) by mouth daily.   atenolol 25 MG tablet Commonly known as:  TENORMIN Take 1 tablet (25 mg total) by mouth 2 (two) times daily.   azithromycin 250 MG tablet Commonly known as:  ZITHROMAX Two tablets day 1 then one tablet daily until gone.   carbamazepine 200 MG tablet Commonly known as:  TEGRETOL  Take 600 mg by mouth at bedtime.   dicyclomine 20 MG tablet Commonly known as:  Bentyl Take 1 tablet (20 mg total) by mouth 4 (four) times daily -  with meals and at bedtime.   hydrALAZINE 25 MG tablet Commonly known as:  APRESOLINE Take 1 tablet (25 mg total) by mouth 3 (three) times daily.   hydrOXYzine 25 MG capsule Commonly known as:  VISTARIL Take 25 mg by mouth 2 (two) times daily as needed.   ibuprofen 200 MG tablet Commonly known as:  ADVIL,MOTRIN Take 2 tablets (400 mg total) by mouth every 6 (six) hours as needed for headache or moderate pain.   LaMICtal 100 MG tablet Generic drug:  lamoTRIgine Take 50 mg by mouth at bedtime.   lisinopril 40 MG tablet Commonly known as:  PRINIVIL,ZESTRIL Take 1 tablet (40 mg total) by mouth daily.   loratadine 10 MG tablet Commonly known as:  CLARITIN Take 1 tablet (10 mg total) by mouth daily for 21 days.   Rexulti 1 MG Tabs Generic drug:  Brexpiprazole Take 1 mg by mouth at bedtime.   tiotropium 18 MCG inhalation capsule Commonly known as:  SPIRIVA Place 1 capsule (18 mcg total) into inhaler and inhale daily.          Objective:   Physical Exam  BP 136/73 (BP Location: Left Arm,  Patient Position: Sitting, Cuff Size: Normal)   Pulse 67   Temp 97.9 F (36.6 C) (Oral)   Ht 5\' 6"  (1.676 m)   Wt 185 lb 12.8 oz (84.3 kg)   LMP 11/19/1988 (Exact Date)   BMI 29.99 kg/m     Assessment & Plan:   1. Chronic diarrhea Patient reports that since starting Bentyl the urgency of her bowel movements has improved but the consistency has remained the same.   Refill Today:  - dicyclomine (BENTYL) 20 MG tablet; Take 1 tablet (20 mg total) by mouth 4 (four) times daily -  with meals and at bedtime.  Dispense: 120 tablet; Refill: 6  Patient thinks she may ave Irritable Bowel Syndrome. Ambulatory referral to Gastroenterology. Patient will need [facility].   2. Essential hypertension At target.    Patient has not been on the  Hydralazine for several weeks and BP today was 136/73. Will not renew the Hydralazine at this time. Pt has also been experiencing joint pain.   Check in 3 Months:  - Comprehensive metabolic panel; Future - CBC; Future - Antinuclear Antib (ANA); Future   RTE in 3 months with labs a week prior.

## 2019-02-02 ENCOUNTER — Other Ambulatory Visit: Payer: Self-pay

## 2019-02-02 ENCOUNTER — Ambulatory Visit: Payer: Medicaid Other | Admitting: Pharmacist

## 2019-02-02 DIAGNOSIS — Z79899 Other long term (current) drug therapy: Secondary | ICD-10-CM

## 2019-02-02 NOTE — Progress Notes (Signed)
  [facility] Visit Note  Patient: Shannon Schroeder MRN: [id number] Date of Birth: May 07, 1956 PCP: Tawni Millers, MD   Shannon Schroeder 63 y.o. female presents for a Medication Management visit today.  LMP 11/19/1988 (Exact Date)   Patient Information   Past Medical History:  Diagnosis Date  . 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   Marland Kitchen Anxiety   . Arthritis   . Bipolar disorder ([place])   . Depression   . Dysrhythmia    H/O V TACH  . Hypertension       Past Surgical History:  Procedure Laterality Date  . ABDOMINAL HYSTERECTOMY     partial  . BREAST SURGERY     biopsy  . CARDIAC CATHETERIZATION     X 2  . CARPECTOMY HAND Right   . CATARACT EXTRACTION W/PHACO Left 03/08/2018   Procedure: CATARACT EXTRACTION PHACO AND INTRAOCULAR LENS PLACEMENT (IOC);  Surgeon: Birder Robson, MD;  Location: [facility] ORS;  Service: Ophthalmology;  Laterality: Left;  [place] 00:55 AP% 13.5 CDE 7.52 Fluid pack lot # [id number]  . CATARACT EXTRACTION W/PHACO Right 04/05/2018   Procedure: CATARACT EXTRACTION PHACO AND INTRAOCULAR LENS PLACEMENT (IOC);  Surgeon: Birder Robson, MD;  Location: [facility] ORS;  Service: Ophthalmology;  Laterality: Right;  [place] 00:36 AP% 15.9 CDE 5.72 Fluid pack lot # [id number]  . CHOLECYSTECTOMY    . TUBAL LIGATION       Family History  Problem Relation Age of Onset  . Hypertension Mother   . Heart failure Mother   . Hypertension Brother   . Stroke Brother   . Hypertension Son   . Emphysema Maternal Aunt   . Hypertension Paternal Aunt   . Hypertension Paternal Uncle   . Hypertension Maternal Grandmother   . Hypertension Paternal Grandmother               Social History   Substance and Sexual Activity  Alcohol Use No  . Frequency: Never      Social History   Tobacco Use  Smoking Status Current Every Day Smoker  . Packs/day: 1.00  . Types: Cigarettes  Smokeless Tobacco Never Used      Health Maintenance  Topic Date Due  . Hepatitis C Screening  1955/12/14  . TETANUS/TDAP  05/03/1974  . PAP SMEAR-Modifier  05/03/1976  . COLONOSCOPY  05/03/2005  . MAMMOGRAM  07/17/2017  . INFLUENZA VACCINE  06/03/2019  . HIV Screening  Completed     Assessment and Plan: 1. Psychiatric (Lamotrigine, Brexpiprazole): carbamazepine was stopped by her PCP. States she is doing well on her current regimen.  2. Hypertension (amlodipine, lisinopril, atenolol): no current issues  3. COPD (Spiriva, albuterol prn): no current issues  4. Smoking Cessation: patient is not interested in quitting smoking at this time  Patient states she only misses her medications if she runs out. Visit was limited by telephone encounter.   Paticia Stack, PharmD Pharmacy Resident  02/02/2019 10:46 AM

## 2019-03-01 ENCOUNTER — Telehealth: Payer: Self-pay | Admitting: Gastroenterology

## 2019-03-01 ENCOUNTER — Ambulatory Visit (INDEPENDENT_AMBULATORY_CARE_PROVIDER_SITE_OTHER): Payer: Self-pay | Admitting: Gastroenterology

## 2019-03-01 ENCOUNTER — Telehealth: Payer: Self-pay

## 2019-03-01 DIAGNOSIS — Z791 Long term (current) use of non-steroidal anti-inflammatories (NSAID): Secondary | ICD-10-CM

## 2019-03-01 DIAGNOSIS — R197 Diarrhea, unspecified: Secondary | ICD-10-CM

## 2019-03-01 NOTE — Telephone Encounter (Signed)
Called pt to pre-chart for today's e-visit with Dr. Anna  Unable to contact. LVM to return call 

## 2019-03-01 NOTE — Telephone Encounter (Signed)
Shannon Schroeder with [facility] called about patient needing to know the labs Dr Vicente Males would like drawn. The patient is planning to go there for the labs.

## 2019-03-01 NOTE — Telephone Encounter (Signed)
Patient returned call

## 2019-03-01 NOTE — Progress Notes (Signed)
Shannon Schroeder  [address]  [place]  [place], [place] [address]  Main: [phone]  Fax: [phone]   Gastroenterology Consultation  Referring Provider:     Tawni Millers, MD Primary Care Physician:  Tawni Millers, MD Reason for Consultation:     Diarrhea         HPI:   Virtual Visit via video  Note  I connected with patient on 03/01/19 at  8:30 AM EDT by video  and verified that I am speaking with the correct person using two identifiers.   I discussed the limitations, risks, security and privacy concerns of performing an evaluation and management service by video and the availability of in person appointments. I also discussed with the patient that there may be a patient responsible charge related to this service. The patient expressed understanding and agreed to proceed.  Location of the patient: Home Location of provider: Home Participating persons: Patient and provider only   History of Present Illness: Diarrhea    Shannon Schroeder is a 63 y.o. y/o female referred for consultation & management  by Dr. Mable Fill, Dianah Field, MD.      Diarrhea :  Onset: 5-6 months - [facility] which has slowed her down   Number of bowel movements a day : atleast 6 a day    Color : yellow to brown   Consistency:  Watery and loose  Present status: ongoing    Shape of stool:  No shape    Weight loss:  No  Prior colonoscopy:  Been years- > 10 - recalls was normal   Artificial sugars/sodas/chewing gum:  No    Bloating:  No , no abdominal pain  Gas:  No  Antibiotic use: no  No family history of colon cancer.  NSAID's: Advil - usually 2 a day  Or ibuprofen for 15 years  01/2019 : stool OP-negative, CBC,CMP-normal    Past Medical History:  Diagnosis Date  . 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   Marland Kitchen Anxiety   . Arthritis   . Bipolar disorder ([place])   . Depression   . Dysrhythmia    H/O V TACH  .  Hypertension     Past Surgical History:  Procedure Laterality Date  . ABDOMINAL HYSTERECTOMY     partial  . BREAST SURGERY     biopsy  . CARDIAC CATHETERIZATION     X 2  . CARPECTOMY HAND Right   . CATARACT EXTRACTION W/PHACO Left 03/08/2018   Procedure: CATARACT EXTRACTION PHACO AND INTRAOCULAR LENS PLACEMENT (IOC);  Surgeon: Birder Robson, MD;  Location: [facility] ORS;  Service: Ophthalmology;  Laterality: Left;  [place] 00:55 AP% 13.5 CDE 7.52 Fluid pack lot # [id number]  . CATARACT EXTRACTION W/PHACO Right 04/05/2018   Procedure: CATARACT EXTRACTION PHACO AND INTRAOCULAR LENS PLACEMENT (IOC);  Surgeon: Birder Robson, MD;  Location: [facility] ORS;  Service: Ophthalmology;  Laterality: Right;  [place] 00:36 AP% 15.9 CDE 5.72 Fluid pack lot # [id number]  . CHOLECYSTECTOMY    . TUBAL LIGATION      Prior to Admission medications   Medication Sig Start Date End Date Taking? Authorizing Provider  acetaminophen (TYLENOL) 500 MG tablet Take 500-1,000 mg by mouth every 6 (six) hours as needed for moderate pain or headache.    [provider]  albuterol (PROVENTIL HFA;VENTOLIN HFA) 108 (90 Base) MCG/ACT inhaler Inhale 1-2 puffs into the lungs every 6 (  six) hours as needed for wheezing or shortness of breath. 03/31/18   Doles-Johnson, Teah, NP  amLODipine (NORVASC) 5 MG tablet Take 1 tablet (5 mg total) by mouth daily. 09/28/18   Tawni Millers, MD  atenolol (TENORMIN) 25 MG tablet Take 1 tablet (25 mg total) by mouth 2 (two) times daily. 12/28/18   Tawni Millers, MD  azithromycin (ZITHROMAX) 250 MG tablet Two tablets day 1 then one tablet daily until gone. Patient not taking: Reported on 01/18/2019 03/31/18   Doles-Johnson, Teah, NP  Brexpiprazole (REXULTI) 1 MG TABS Take 1 mg by mouth at bedtime.    [provider]  carbamazepine (TEGRETOL) 200 MG tablet Take 600 mg by mouth at bedtime.     [provider]  dicyclomine (BENTYL) 20 MG tablet Take 1 tablet (20 mg total) by mouth 4  (four) times daily -  with meals and at bedtime. 01/18/19   Tawni Millers, MD  hydrOXYzine (VISTARIL) 25 MG capsule Take 25 mg by mouth 2 (two) times daily as needed.    [provider]  ibuprofen (ADVIL,MOTRIN) 200 MG tablet Take 2 tablets (400 mg total) by mouth every 6 (six) hours as needed for headache or moderate pain. 09/28/18   Tawni Millers, MD  lamoTRIgine (LAMICTAL) 100 MG tablet Take 50 mg by mouth at bedtime.     [provider]  lisinopril (PRINIVIL,ZESTRIL) 40 MG tablet Take 1 tablet (40 mg total) by mouth daily. 09/28/18   Tawni Millers, MD  loratadine (CLARITIN) 10 MG tablet Take 1 tablet (10 mg total) by mouth daily for 21 days. 06/29/18 01/18/19  Tawni Millers, MD  tiotropium (SPIRIVA) 18 MCG inhalation capsule Place 1 capsule (18 mcg total) into inhaler and inhale daily. 04/21/18   Doles-Johnson, Teah, NP    Family History  Problem Relation Age of Onset  . Hypertension Mother   . Heart failure Mother   . Hypertension Brother   . Stroke Brother   . Hypertension Son   . Emphysema Maternal Aunt   . Hypertension Paternal Aunt   . Hypertension Paternal Uncle   . Hypertension Maternal Grandmother   . Hypertension Paternal Grandmother      Social History   Tobacco Use  . Smoking status: Current Every Day Smoker    Packs/day: 1.00    Types: Cigarettes  . Smokeless tobacco: Never Used  Substance Use Topics  . Alcohol use: No    Frequency: Never  . Drug use: No    Allergies as of 03/01/2019 - Review Complete 03/01/2019  Allergen Reaction Noted  . Aspirin Swelling 11/03/2017  . Naproxen Swelling and Rash 11/03/2017  . Paxil [paroxetine hcl] Rash 11/03/2017  . Phenobarbital Rash 11/03/2017  . Prozac [fluoxetine hcl] Rash 11/03/2017    Review of Systems:    All systems reviewed and negative except where noted in HPI. General Appearance:    Alert, cooperative, no distress, appears stated age  Head:    Normocephalic, without obvious abnormality,  atraumatic  Eyes:    PERRL, conjunctiva/corneas clear,  Ears:    Grossly normal hearing    Neurologic:   Grossly appears normal     Observations/Objective:  Labs: CBC    Component Value Date/Time   WBC 5.1 12/21/2018 1041   WBC 5.9 06/15/2018 1113   RBC 3.80 12/21/2018 1041   RBC 3.87 06/15/2018 1113   HGB 12.5 12/21/2018 1041   HCT 37.1 12/21/2018 1041   HCT 36 12/18/2016   PLT  277 12/21/2018 1041   MCV 98 (H) 12/21/2018 1041   MCV 98 12/18/2016   MCH 32.9 12/21/2018 1041   MCH 34.1 (H) 06/15/2018 1113   MCHC 33.7 12/21/2018 1041   MCHC 35.0 06/15/2018 1113   RDW 13.2 12/21/2018 1041   RDW 14.5 12/18/2016   LYMPHSABS 1.6 05/03/2018 1826   MONOABS 0 12/18/2016   EOSABS 0.2 05/03/2018 1826   BASOSABS 0.1 05/03/2018 1826   BASOSABS 2 12/18/2016   CMP     Component Value Date/Time   NA 140 12/21/2018 1041   K 4.6 12/21/2018 1041   K 4.2 06/26/2015   CL 103 12/21/2018 1041   CL 91 05/25/2016   CO2 27 12/21/2018 1041   CO2 25 08/19/2016   GLUCOSE 73 12/21/2018 1041   GLUCOSE 95 06/15/2018 1113   BUN 13 12/21/2018 1041   BUN 11 12/18/2016   CREATININE 0.64 12/21/2018 1041   CREATININE 0.67 12/18/2016   CALCIUM 9.3 12/21/2018 1041   CALCIUM 8.9 12/18/2016   PROT 6.7 12/21/2018 1041   ALBUMIN 4.4 12/21/2018 1041   ALBUMIN 4.5 12/18/2016   AST 13 12/21/2018 1041   AST 15 12/18/2016   ALT 9 12/21/2018 1041   ALT 11 08/19/2016   ALKPHOS 113 12/21/2018 1041   ALKPHOS 117 12/18/2016   BILITOT 0.3 12/21/2018 1041   BILITOT 0.3 12/18/2016   GFRNONAA 95 12/21/2018 1041   GFRNONAA 95 12/18/2016   GFRAA 110 12/21/2018 1041   GFRAA 110 12/18/2016    Imaging Studies: No results found.  Assessment and Plan:   Shannon Schroeder is a 63 y.o. y/o female has been referred for chronic diarrhea and lonmg term nsaid use. Differentials are vast ranging from NSAID related colitis to IBS  Plan  1. I plan to rule out infection with stool tests , if negative will proceed  with a colonoscopy. She is overdue for her colon cancer screening she says  . Will need cardiology clearance prior- h/o V tach 2. Check Celiac serology  3. If stopping Advil has helped then colonoscopy after COVID otherwise sooner.   F/u in 3 weeks  I discussed the assessment and treatment plan with the patient. The patient was provided an opportunity to ask questions and all were answered. The patient agreed with the plan and demonstrated an understanding of the instructions.   The patient was advised to call back or seek an in-person evaluation if the symptoms worsen or if the condition fails to improve as anticipated.   Dr Shannon Bellows MD,MRCP [facility]) Gastroenterology/Hepatology Pager: [phone]   Speech recognition software was used to dictate the above note.

## 2019-03-01 NOTE — Telephone Encounter (Signed)
Left vm for pt to call office and schedule 3 week Video apt Dr. Vicente Males

## 2019-03-10 ENCOUNTER — Telehealth: Payer: Self-pay | Admitting: Pharmacist

## 2019-03-10 NOTE — Telephone Encounter (Signed)
03/10/2019 2:44:14 PM - [name] call to [facility]  03/10/2019 Called [facility] spoke with Caryl Pina according to her they last shipped to patient 11/15/2018 and was able to process for a refill today, they will ship to patient. 1 refill left, and enrolled till 06/10/2019.Delos Haring

## 2019-03-10 NOTE — Telephone Encounter (Signed)
03/10/2019 8:45:04 AM - [place] pending  03/10/2019 I have received the Ventolin script back from provider--holding for patient to return her portion that was mailed to her 02/21/2019.This is for [place] renewal.AJ

## 2019-03-14 NOTE — Telephone Encounter (Signed)
Spoke with pt regarding stool test samples. Pt understands that she'll need to pick up the specimen collection kit from [facility] and return the collected specimen to [facility] for testing.

## 2019-03-14 NOTE — Telephone Encounter (Signed)
Called pt to remind her that she'll need to take her stool specimen to a local [facility] collection site. This was previously discussed with pt.  Unable to contact, LVM to return call

## 2019-03-14 NOTE — Telephone Encounter (Signed)
Patient called returning Jadijah's call.

## 2019-03-14 NOTE — Telephone Encounter (Signed)
[facility] but they can not do the stool sample please send stool sample to [facility] so she can have that done

## 2019-03-15 ENCOUNTER — Ambulatory Visit: Payer: Self-pay | Admitting: Gastroenterology

## 2019-03-15 ENCOUNTER — Other Ambulatory Visit: Payer: Self-pay

## 2019-03-15 ENCOUNTER — Other Ambulatory Visit: Payer: Medicaid Other

## 2019-03-15 DIAGNOSIS — I10 Essential (primary) hypertension: Secondary | ICD-10-CM

## 2019-03-16 LAB — CBC
Hematocrit: 39 % (ref 34.0–46.6)
Hemoglobin: 13.8 g/dL (ref 11.1–15.9)
MCH: 33.8 pg — ABNORMAL HIGH (ref 26.6–33.0)
MCHC: 35.4 g/dL (ref 31.5–35.7)
MCV: 96 fL (ref 79–97)
Platelets: 325 10*3/uL (ref 150–450)
RBC: 4.08 x10E6/uL (ref 3.77–5.28)
RDW: 12.8 % (ref 11.7–15.4)
WBC: 8.5 10*3/uL (ref 3.4–10.8)

## 2019-03-16 LAB — COMPREHENSIVE METABOLIC PANEL
ALT: 10 IU/L (ref 0–32)
AST: 10 IU/L (ref 0–40)
Albumin/Globulin Ratio: 2 (ref 1.2–2.2)
Albumin: 4.5 g/dL (ref 3.8–4.8)
Alkaline Phosphatase: 127 IU/L — ABNORMAL HIGH (ref 39–117)
BUN/Creatinine Ratio: 18 (ref 12–28)
BUN: 12 mg/dL (ref 8–27)
Bilirubin Total: <0.2 mg/dL (ref 0.0–1.2)
CO2: 23 mmol/L (ref 20–29)
Calcium: 9.5 mg/dL (ref 8.7–10.3)
Chloride: 98 mmol/L (ref 96–106)
Creatinine, Ser: 0.65 mg/dL (ref 0.57–1.00)
GFR calc Af Amer: 109 mL/min/{1.73_m2} (ref >59)
GFR calc non Af Amer: 95 mL/min/{1.73_m2} (ref >59)
Globulin, Total: 2.3 g/dL (ref 1.5–4.5)
Glucose: 87 mg/dL (ref 65–99)
Potassium: 4.2 mmol/L (ref 3.5–5.2)
Sodium: 135 mmol/L (ref 134–144)
Total Protein: 6.8 g/dL (ref 6.0–8.5)

## 2019-03-16 LAB — ANA: Anti Nuclear Antibody (ANA): NEGATIVE

## 2019-03-20 ENCOUNTER — Ambulatory Visit (INDEPENDENT_AMBULATORY_CARE_PROVIDER_SITE_OTHER): Payer: Medicaid Other | Admitting: Gastroenterology

## 2019-03-20 DIAGNOSIS — R197 Diarrhea, unspecified: Secondary | ICD-10-CM

## 2019-03-20 NOTE — Progress Notes (Signed)
Jonathon Bellows , MD [address]  [place]  [place],  [address]  Main: [phone]  Fax: [phone]   Primary Care Physician: Tawni Millers, MD  Virtual Visit via Video Note  I connected with patient on 03/20/19 at  9:30 AM EDT by video and verified that I am speaking with the correct person using two identifiers.   I discussed the limitations, risks, security and privacy concerns of performing an evaluation and management service by video  and the availability of in person appointments. I also discussed with the patient that there may be a patient responsible charge related to this service. The patient expressed understanding and agreed to proceed.  Location of Patient: Home Location of Provider: Home Persons involved: Patient and provider only   History of Present Illness: Chief Complaint  Patient presents with  . Follow-up    Diarrhea    HPI: Shannon Schroeder is a 63 y.o. female   Summary of history :  She was initially referred and seen on 03/01/2019 for diarrhea of 5 to 6 months in duration.  [facility] which has slowed it down.  6 bowel movements a day, watery and loose, no shape.  Last endoscopy was over 10 years back and she recalls was normal.  No artificial sugar sodas or chewing gum in her diet.  She has been on Advil 2 tablets/day or ibuprofen for at least 15 years. 01/2019: Stool ova and parasite were negative, CBC, CMP were normal.  Interval history   02/28/2021 -03/20/2019   After the last visit stool test for GI PCR fecal calprotectin and celiac disease were ordered but have not been obtained.  Still has diarrhea, no bleeding, stopped NSAID's     Current Outpatient Medications  Medication Sig Dispense Refill  . acetaminophen (TYLENOL) 500 MG tablet Take 500-1,000 mg by mouth every 6 (six) hours as needed for moderate pain or headache.    . albuterol (PROVENTIL HFA;VENTOLIN HFA) 108 (90 Base)  MCG/ACT inhaler Inhale 1-2 puffs into the lungs every 6 (six) hours as needed for wheezing or shortness of breath. 1 Inhaler 2  . amLODipine (NORVASC) 5 MG tablet Take 1 tablet (5 mg total) by mouth daily. 90 tablet 3  . atenolol (TENORMIN) 25 MG tablet Take 1 tablet (25 mg total) by mouth 2 (two) times daily. 180 tablet 3  . Brexpiprazole (REXULTI) 1 MG TABS Take 1 mg by mouth at bedtime.    . carbamazepine (TEGRETOL) 200 MG tablet Take 600 mg by mouth at bedtime.     . dicyclomine (BENTYL) 20 MG tablet Take 1 tablet (20 mg total) by mouth 4 (four) times daily -  with meals and at bedtime. 120 tablet 6  . hydrOXYzine (VISTARIL) 25 MG capsule Take 25 mg by mouth 2 (two) times daily as needed.    Marland Kitchen ibuprofen (ADVIL,MOTRIN) 200 MG tablet Take 2 tablets (400 mg total) by mouth every 6 (six) hours as needed for headache or moderate pain. 90 tablet 3  . lamoTRIgine (LAMICTAL) 100 MG tablet Take 50 mg by mouth at bedtime.     Marland Kitchen lisinopril (PRINIVIL,ZESTRIL) 40 MG tablet Take 1 tablet (40 mg total) by mouth daily. 90 tablet 3  . tiotropium (SPIRIVA) 18 MCG inhalation capsule Place 1 capsule (18 mcg total) into inhaler and inhale daily. 30 capsule 12  . azithromycin (ZITHROMAX) 250 MG tablet Two tablets day 1 then one tablet daily until gone. (Patient  not taking: Reported on 01/18/2019) 6 each 0  . loratadine (CLARITIN) 10 MG tablet Take 1 tablet (10 mg total) by mouth daily for 21 days. 21 tablet 0   No current facility-administered medications for this visit.     Allergies as of 03/20/2019 - Review Complete 03/20/2019  Allergen Reaction Noted  . Aspirin Swelling 11/03/2017  . Naproxen Swelling and Rash 11/03/2017  . Paxil [paroxetine hcl] Rash 11/03/2017  . Phenobarbital Rash 11/03/2017  . Prozac [fluoxetine hcl] Rash 11/03/2017    Review of Systems:    All systems reviewed and negative except where noted in HPI.  General Appearance:    Alert, cooperative, no distress, appears stated age  Head:     Normocephalic, without obvious abnormality, atraumatic  Eyes:    PERRL, conjunctiva/corneas clear,  Ears:    Grossly normal hearing    Neurologic:  Grossly normal    Observations/Objective:  Labs: CMP     Component Value Date/Time   NA 135 03/15/2019 1201   K 4.2 03/15/2019 1201   K 4.2 06/26/2015   CL 98 03/15/2019 1201   CL 91 05/25/2016   CO2 23 03/15/2019 1201   CO2 25 08/19/2016   GLUCOSE 87 03/15/2019 1201   GLUCOSE 95 06/15/2018 1113   BUN 12 03/15/2019 1201   BUN 11 12/18/2016   CREATININE 0.65 03/15/2019 1201   CREATININE 0.67 12/18/2016   CALCIUM 9.5 03/15/2019 1201   CALCIUM 8.9 12/18/2016   PROT 6.8 03/15/2019 1201   ALBUMIN 4.5 03/15/2019 1201   ALBUMIN 4.5 12/18/2016   AST 10 03/15/2019 1201   AST 15 12/18/2016   ALT 10 03/15/2019 1201   ALT 11 08/19/2016   ALKPHOS 127 (H) 03/15/2019 1201   ALKPHOS 117 12/18/2016   BILITOT <0.2 03/15/2019 1201   BILITOT 0.3 12/18/2016   GFRNONAA 95 03/15/2019 1201   GFRNONAA 95 12/18/2016   GFRAA 109 03/15/2019 1201   GFRAA 110 12/18/2016   Lab Results  Component Value Date   WBC 8.5 03/15/2019   HGB 13.8 03/15/2019   HCT 39.0 03/15/2019   MCV 96 03/15/2019   PLT 325 03/15/2019    Imaging Studies: No results found.  Assessment and Plan:   Shannon Schroeder is a 63 y.o. y/o female here to follow-up for chronic diarrhea and lonmg term nsaid use. Diarrhea persists despite NSAID cessation    Plan  1. I plan to rule out infection with stool tests , if negative will proceed with a colonoscopy. She is overdue for her colon cancer screening she says  . Will need cardiology clearance prior- h/o V tach 2. Check Celiac serology, TSH   I have discussed alternative options, risks & benefits,  which include, but are not limited to, bleeding, infection, perforation,respiratory complication & drug reaction.  The patient agrees with this plan & written consent will be obtained.      I discussed the assessment and  treatment plan with the patient. The patient was provided an opportunity to ask questions and all were answered. The patient agreed with the plan and demonstrated an understanding of the instructions.   The patient was advised to call back or seek an in-person evaluation if the symptoms worsen or if the condition fails to improve as anticipated.    Dr Jonathon Bellows MD,MRCP [facility]) Gastroenterology/Hepatology Pager: [phone]   Speech recognition software was used to dictate this note.

## 2019-03-21 ENCOUNTER — Ambulatory Visit: Payer: Medicaid Other | Admitting: Gastroenterology

## 2019-03-22 ENCOUNTER — Ambulatory Visit: Payer: Medicaid Other | Admitting: Internal Medicine

## 2019-03-22 ENCOUNTER — Telehealth: Payer: Self-pay | Admitting: Cardiovascular Disease

## 2019-03-22 DIAGNOSIS — Z8679 Personal history of other diseases of the circulatory system: Secondary | ICD-10-CM

## 2019-03-22 DIAGNOSIS — Z01810 Encounter for preprocedural cardiovascular examination: Secondary | ICD-10-CM

## 2019-03-22 NOTE — Progress Notes (Signed)
Subjective:    Patient ID: Shannon Schroeder, female    DOB: [birth year], 63 y.o.   MRN: [id number]  HPI  Patient is a 63 year old female who presents for cardiac clearance for gastroenterology procedure; colonoscopy.  Patient explained that she has a hx of ventricular tachycardia.   Review of Systems   Patient Active Problem List   Diagnosis Date Noted  . Bipolar affective disorder, current episode mixed ([place]) 06/29/2018  . TIA (transient ischemic attack) 06/15/2018  . Tobacco abuse 06/02/2018  . COPD (chronic obstructive pulmonary disease) ([place]) 04/21/2018  . URI (upper respiratory infection) 03/31/2018  . Hypertension 03/31/2018  . Tachycardia 02/02/2018  . Rash of body 02/02/2018  . Hyponatremia 02/02/2018    Allergies as of 03/22/2019      Reactions   Aspirin Swelling   Naproxen Swelling, Rash   Paxil [paroxetine Hcl] Rash   Phenobarbital Rash   Prozac [fluoxetine Hcl] Rash      Medication List       Accurate as of Mar 22, 2019 11:35 AM. If you have any questions, ask your nurse or doctor.        acetaminophen 500 MG tablet Commonly known as:  TYLENOL Take 500-1,000 mg by mouth every 6 (six) hours as needed for moderate pain or headache.   albuterol 108 (90 Base) MCG/ACT inhaler Commonly known as:  VENTOLIN HFA Inhale 1-2 puffs into the lungs every 6 (six) hours as needed for wheezing or shortness of breath.   amLODipine 5 MG tablet Commonly known as:  NORVASC Take 1 tablet (5 mg total) by mouth daily.   atenolol 25 MG tablet Commonly known as:  TENORMIN Take 1 tablet (25 mg total) by mouth 2 (two) times daily.   azithromycin 250 MG tablet Commonly known as:  ZITHROMAX Two tablets day 1 then one tablet daily until gone.   carbamazepine 200 MG tablet Commonly known as:  TEGRETOL Take 600 mg by mouth at bedtime.   dicyclomine 20 MG tablet Commonly known as:  Bentyl Take 1 tablet (20 mg total) by mouth 4 (four) times daily -  with meals and at  bedtime.   hydrOXYzine 25 MG capsule Commonly known as:  VISTARIL Take 25 mg by mouth 2 (two) times daily as needed.   ibuprofen 200 MG tablet Commonly known as:  ADVIL Take 2 tablets (400 mg total) by mouth every 6 (six) hours as needed for headache or moderate pain.   LaMICtal 100 MG tablet Generic drug:  lamoTRIgine Take 50 mg by mouth at bedtime.   lisinopril 40 MG tablet Commonly known as:  ZESTRIL Take 1 tablet (40 mg total) by mouth daily.   loratadine 10 MG tablet Commonly known as:  CLARITIN Take 1 tablet (10 mg total) by mouth daily for 21 days.   Rexulti 1 MG Tabs Generic drug:  Brexpiprazole Take 1 mg by mouth at bedtime.   tiotropium 18 MCG inhalation capsule Commonly known as:  SPIRIVA Place 1 capsule (18 mcg total) into inhaler and inhale daily.          Objective:   Physical Exam  Telephonic Visit     Assessment & Plan:   1. History of ventricular tachycardia Patient has had two separate episodes with heart rates above 200 requiring cardiac defibrillation. Evaluated at [facility] some years ago. Records not available for review. Pt reported everything was okay but that her heart did not empty completely, she recalls something about a systolic problem. Reports it has  been well controlled on the Atenolol.   Today:  - Ambulatory referral to Cardiology  2. Encounter for pre-operative cardiovascular clearance Please send clearance to Jonathon Bellows, MD at [facility] Gastroenterology for colonoscopy.  Fax: [phone] ATTN: Micael [place], [place]

## 2019-03-22 NOTE — Telephone Encounter (Signed)
Pending

## 2019-03-23 ENCOUNTER — Telehealth: Payer: Self-pay | Admitting: Cardiovascular Disease

## 2019-03-23 NOTE — Telephone Encounter (Signed)
Virtual Visit Pre-Appointment Phone Call  "(Name), I am calling you today to discuss your upcoming appointment. We are currently trying to limit exposure to the virus that causes COVID-19 by seeing patients at home rather than in the office."  1. "What is the BEST phone number to call the day of the visit?" - include this in appointment notes  2. Do you have or have access to (through a family member/friend) a smartphone with video capability that we can use for your visit?" a. If yes - list this number in appt notes as cell (if different from BEST phone #) and list the appointment type as a VIDEO visit in appointment notes b. If no - list the appointment type as a PHONE visit in appointment notes  3. Confirm consent - "In the setting of the current Covid19 crisis, you are scheduled for a (phone or video) visit with your provider on (date) at (time).  Just as we do with many in-office visits, in order for you to participate in this visit, we must obtain consent.  If you'd like, I can send this to your mychart (if signed up) or email for you to review.  Otherwise, I can obtain your verbal consent now.  All virtual visits are billed to your insurance company just like a normal visit would be.  By agreeing to a virtual visit, we'd like you to understand that the technology does not allow for your provider to perform an examination, and thus may limit your provider's ability to fully assess your condition. If your provider identifies any concerns that need to be evaluated in person, we will make arrangements to do so.  Finally, though the technology is pretty good, we cannot assure that it will always work on either your or our end, and in the setting of a video visit, we may have to convert it to a phone-only visit.  In either situation, we cannot ensure that we have a secure connection.  Are you willing to proceed?" STAFF: Did the patient verbally acknowledge consent to telehealth visit? Document  YES/NO here: YES  4. Advise patient to be prepared - "Two hours prior to your appointment, go ahead and check your blood pressure, pulse, oxygen saturation, and your weight (if you have the equipment to check those) and write them all down. When your visit starts, your provider will ask you for this information. If you have an Apple Watch or Kardia device, please plan to have heart rate information ready on the day of your appointment. Please have a pen and paper handy nearby the day of the visit as well."  5. Give patient instructions for MyChart download to smartphone OR Doximity/Doxy.me as below if video visit (depending on what platform provider is using)  6. Inform patient they will receive a phone call 15 minutes prior to their appointment time (may be from unknown caller ID) so they should be prepared to answer    TELEPHONE CALL NOTE  Shannon Schroeder has been deemed a candidate for a follow-up tele-health visit to limit community exposure during the Covid-19 pandemic. I spoke with the patient via phone to ensure availability of phone/video source, confirm preferred email & phone number, and discuss instructions and expectations.  I reminded Shannon Schroeder to be prepared with any vital sign and/or heart rhythm information that could potentially be obtained via home monitoring, at the time of her visit. I reminded Shannon Schroeder to expect a phone call prior to her visit.  Shannon Schroeder 03/23/2019 1:16 PM   INSTRUCTIONS FOR DOWNLOADING THE MYCHART APP TO SMARTPHONE  - The patient must first make sure to have activated MyChart and know their login information - If Apple, go to [employer] and type in MyChart in the search bar and download the app. If Android, ask patient to go to [employer] and type in [place] in the search bar and download the app. The app is free but as with any other app downloads, their phone may require them to verify saved payment information or  Apple/Android password.  - The patient will need to then log into the app with their MyChart username and password, and select [facility] as their healthcare provider to link the account. When it is time for your visit, go to the MyChart app, find appointments, and click Begin Video Visit. Be sure to Select Allow for your device to access the Microphone and Camera for your visit. You will then be connected, and your provider will be with you shortly.  **If they have any issues connecting, or need assistance please contact MyChart service desk [phone])**  **If using a computer, in order to ensure the best quality for their visit they will need to use either of the following Internet Browsers: [employer], or Google Chrome**  IF USING DOXIMITY or DOXY.ME - The patient will receive a link just prior to their visit by text.     FULL LENGTH CONSENT FOR TELE-HEALTH VISIT   I hereby voluntarily request, consent and authorize [facility] and its employed or contracted physicians, physician assistants, nurse practitioners or other licensed health care professionals (the Practitioner), to provide me with telemedicine health care services (the Services") as deemed necessary by the treating Practitioner. I acknowledge and consent to receive the Services by the Practitioner via telemedicine. I understand that the telemedicine visit will involve communicating with the Practitioner through live audiovisual communication technology and the disclosure of certain medical information by electronic transmission. I acknowledge that I have been given the opportunity to request an in-person assessment or other available alternative prior to the telemedicine visit and am voluntarily participating in the telemedicine visit.  I understand that I have the right to withhold or withdraw my consent to the use of telemedicine in the course of my care at any time, without affecting my right to future care  or treatment, and that the Practitioner or I may terminate the telemedicine visit at any time. I understand that I have the right to inspect all information obtained and/or recorded in the course of the telemedicine visit and may receive copies of available information for a reasonable fee.  I understand that some of the potential risks of receiving the Services via telemedicine include:   Delay or interruption in medical evaluation due to technological equipment failure or disruption;  Information transmitted may not be sufficient (e.g. poor resolution of images) to allow for appropriate medical decision making by the Practitioner; and/or   In rare instances, security protocols could fail, causing a breach of personal health information.  Furthermore, I acknowledge that it is my responsibility to provide information about my medical history, conditions and care that is complete and accurate to the best of my ability. I acknowledge that Practitioner's advice, recommendations, and/or decision may be based on factors not within their control, such as incomplete or inaccurate data provided by me or distortions of diagnostic images or specimens that may result from electronic transmissions. I understand that the practice  of medicine is not an Chief Strategy Officer and that Practitioner makes no warranties or guarantees regarding treatment outcomes. I acknowledge that I will receive a copy of this consent concurrently upon execution via email to the email address I last provided but may also request a printed copy by calling the office of [facility].    I understand that my insurance will be billed for this visit.   I have read or had this consent read to me.  I understand the contents of this consent, which adequately explains the benefits and risks of the Services being provided via telemedicine.   I have been provided ample opportunity to ask questions regarding this consent and the Services and have had  my questions answered to my satisfaction.  I give my informed consent for the services to be provided through the use of telemedicine in my medical care  By participating in this telemedicine visit I agree to the above.

## 2019-03-24 ENCOUNTER — Telehealth (INDEPENDENT_AMBULATORY_CARE_PROVIDER_SITE_OTHER): Payer: Self-pay | Admitting: Cardiovascular Disease

## 2019-03-24 ENCOUNTER — Other Ambulatory Visit: Payer: Self-pay

## 2019-03-24 ENCOUNTER — Telehealth: Payer: Self-pay | Admitting: Cardiovascular Disease

## 2019-03-24 ENCOUNTER — Encounter: Payer: Self-pay | Admitting: Cardiovascular Disease

## 2019-03-24 ENCOUNTER — Other Ambulatory Visit: Payer: Self-pay | Admitting: Gastroenterology

## 2019-03-24 VITALS — HR 60 | Ht 66.0 in | Wt 180.0 lb

## 2019-03-24 DIAGNOSIS — R002 Palpitations: Secondary | ICD-10-CM

## 2019-03-24 DIAGNOSIS — I10 Essential (primary) hypertension: Secondary | ICD-10-CM

## 2019-03-24 DIAGNOSIS — R0602 Shortness of breath: Secondary | ICD-10-CM

## 2019-03-24 DIAGNOSIS — Z01810 Encounter for preprocedural cardiovascular examination: Secondary | ICD-10-CM

## 2019-03-24 NOTE — Telephone Encounter (Signed)
LMOV to schedule  

## 2019-03-24 NOTE — Progress Notes (Signed)
Virtual Visit via Video Note   This visit type was conducted due to national recommendations for restrictions regarding the COVID-19 Pandemic (e.g. social distancing) in an effort to limit this patient's exposure and mitigate transmission in our community.  Due to her co-morbid illnesses, this patient is at least at moderate risk for complications without adequate follow up.  This format is felt to be most appropriate for this patient at this time.  All issues noted in this document were discussed and addressed.  A limited physical exam was performed with this format.  Please refer to the patient's chart for her consent to telehealth for [facility].   Date:  03/24/2019   ID:  Shannon Schroeder, DOB November 12, 1955, MRN [id number]  Patient Location: Home Provider Location: Office  PCP:  Tawni Millers, MD  Cardiologist:  No primary care provider on file.  Electrophysiologist:  None   Evaluation Performed:  Consultation - Shannon Schroeder was referred by Dr. Mable Fill for the evaluation of preoperative cardiovascular evaluation before colonoscopy.  Chief Complaint: Palpitations  History of Present Illness:    Shannon Schroeder is a 64 y.o. female the patient is referred for preoperative cardiovascular evaluation for colonoscopy.  She has known history of essential hypertension, tobacco use and brain aneurysm status post clipping in 2019.  She reports cardiac history 2000 03/05/2005 with previous work-up at [facility].  From what she tells me, she had 2 episodes of ventricular tachycardia that required cardioversion.  She remembers having cardiac catheterization at that time and was told that there was no significant blockages.  It was explained to her that her heart did not empty completely possibly referring to diastolic heart failure.  However, I do not have any of these previous records and are not available on care everywhere.  Fortunately, since that time, she has not had any documented arrhythmia  although she does complain of intermittent palpitations but no prolonged tachycardia.  She has mild exertional dyspnea but no chest pain.  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  She is able to do her work without significant limitations. She currently smokes 1 pack/day.  The patient does not have symptoms concerning for COVID-19 infection (fever, chills, cough, or new shortness of breath).    Past Medical History:  Diagnosis Date  . 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   Marland Kitchen Anxiety   . Arthritis   . Bipolar disorder ([place])   . Depression   . Dysrhythmia    H/O V TACH  . Hypertension    Past Surgical History:  Procedure Laterality Date  . ABDOMINAL HYSTERECTOMY     partial  . BREAST SURGERY     biopsy  . CARDIAC CATHETERIZATION     X 2  . CARPECTOMY HAND Right   . CATARACT EXTRACTION W/PHACO Left 03/08/2018   Procedure: CATARACT EXTRACTION PHACO AND INTRAOCULAR LENS PLACEMENT (IOC);  Surgeon: Birder Robson, MD;  Location: [facility] ORS;  Service: Ophthalmology;  Laterality: Left;  [place] 00:55 AP% 13.5 CDE 7.52 Fluid pack lot # [id number]  . CATARACT EXTRACTION W/PHACO Right 04/05/2018   Procedure: CATARACT EXTRACTION PHACO AND INTRAOCULAR LENS PLACEMENT (IOC);  Surgeon: Birder Robson, MD;  Location: [facility] ORS;  Service: Ophthalmology;  Laterality: Right;  [place] 00:36 AP% 15.9 CDE 5.72 Fluid pack lot # [id number]  . CHOLECYSTECTOMY    . TUBAL LIGATION       Current Meds  Medication Sig  .  albuterol (PROVENTIL HFA;VENTOLIN HFA) 108 (90 Base) MCG/ACT inhaler Inhale 1-2 puffs into the lungs every 6 (six) hours as needed for wheezing or shortness of breath.  Marland Kitchen amLODipine (NORVASC) 5 MG tablet Take 1 tablet (5 mg total) by mouth daily.  Marland Kitchen atenolol (TENORMIN) 25 MG tablet Take 1 tablet (25 mg total) by mouth 2 (two) times daily.  . Brexpiprazole (REXULTI) 1 MG TABS Take 1 mg by mouth at bedtime.  . hydrOXYzine (VISTARIL)  25 MG capsule Take 25 mg by mouth 2 (two) times daily as needed.  . lamoTRIgine (LAMICTAL) 100 MG tablet Take 50 mg by mouth at bedtime.   Marland Kitchen lisinopril (PRINIVIL,ZESTRIL) 40 MG tablet Take 1 tablet (40 mg total) by mouth daily.  Marland Kitchen loratadine (CLARITIN) 10 MG tablet Take 1 tablet (10 mg total) by mouth daily for 21 days.  Marland Kitchen tiotropium (SPIRIVA) 18 MCG inhalation capsule Place 1 capsule (18 mcg total) into inhaler and inhale daily.     Allergies:   Fluoxetine; Aspirin; Belladonna alkaloids; Fluoxetine hcl; Naproxen; Paroxetine hcl; Phenobarbital; and Prozac [fluoxetine hcl]   Social History   Tobacco Use  . Smoking status: Current Every Day Smoker    Packs/day: 1.00    Types: Cigarettes  . Smokeless tobacco: Never Used  Substance Use Topics  . Alcohol use: No    Frequency: Never  . Drug use: No     Family Hx: The patient's family history includes Emphysema in her maternal aunt; Heart failure in her mother; Hypertension in her brother, maternal grandmother, mother, paternal aunt, paternal grandmother, paternal uncle, and son; Stroke in her brother.  ROS:   Please see the history of present illness.     All other systems reviewed and are negative.   Prior CV studies:   The following studies were reviewed today:    Labs/Other Tests and Data Reviewed:    EKG:  An ECG dated August 2019 was personally reviewed today and demonstrated:  Sinus rhythm with possible septal infarct and nonspecific inferior ST changes  Recent Labs: 09/21/2018: TSH 2.110 03/15/2019: ALT 10; BUN 12; Creatinine, Ser 0.65; Hemoglobin 13.8; Platelets 325; Potassium 4.2; Sodium 135   Recent Lipid Panel Lab Results  Component Value Date/Time   CHOL 180 12/21/2018 10:41 AM   CHOL 221 04/23/2015   TRIG 84 12/21/2018 10:41 AM   TRIG 97 12/18/2016   HDL 71 12/21/2018 10:41 AM   HDL 97 12/18/2016   CHOLHDL 2.5 12/21/2018 10:41 AM   LDLCALC 92 12/21/2018 10:41 AM   LDLCALC 108 04/23/2015    Wt Readings  from Last 3 Encounters:  03/24/19 180 lb (81.6 kg)  01/18/19 185 lb 12.8 oz (84.3 kg)  12/28/18 186 lb 8 oz (84.6 kg)     Objective:    Vital Signs:  Pulse 60   Ht 5\' 6"  (1.676 m)   Wt 180 lb (81.6 kg)   LMP 11/19/1988 (Exact Date)   BMI 29.05 kg/m    VITAL SIGNS:  reviewed GEN:  no acute distress EYES:  sclerae anicteric, EOMI - Extraocular Movements Intact RESPIRATORY:  normal respiratory effort, symmetric expansion SKIN:  no rash, lesions or ulcers. MUSCULOSKELETAL:  no obvious deformities. NEURO:  alert and oriented x 3, no obvious focal deficit PSYCH:  normal affect  ASSESSMENT & PLAN:    1. Preoperative cardiovascular evaluation for colonoscopy: Patient reports history of ventricular tachycardia in the remote past with no recent documented arrhythmia.  She has mild palpitations overall.  I do think we have  to make sure that her ejection fraction is normal.  I am going to obtain an echocardiogram and a repeat EKG.  If those are unremarkable, she can proceed with colonoscopy with an overall low risk from a cardiac standpoint.  It is reassuring that her functional capacity is good overall with no anginal symptoms. 2. Questionable history of ventricular tachycardia: Based on the patient's description but no records are available.  She is on atenolol.  If ejection fraction is normal, no further work-up is needed especially that she has not had any recent documented arrhythmia. 3. Essential hypertension: Blood pressure is controlled.  COVID-19 Education: The signs and symptoms of COVID-19 were discussed with the patient and how to seek care for testing (follow up with PCP or arrange E-visit).  The importance of social distancing was discussed today.  Time:   Today, I have spent 13 minutes with the patient with telehealth technology discussing the above problems.     Medication Adjustments/Labs and Tests Ordered: Current medicines are reviewed at length with the patient today.   Concerns regarding medicines are outlined above.   Tests Ordered: No orders of the defined types were placed in this encounter.   Medication Changes: No orders of the defined types were placed in this encounter.   Disposition:  Follow up prn  Signed, Kathlyn Sacramento, MD  03/24/2019 11:11 AM    [place]

## 2019-03-24 NOTE — Telephone Encounter (Signed)
-----   Message from Ricci Barker, RN sent at 03/24/2019 11:29 AM EDT ----- Regarding: echo and ekg Hello,  Per Dr. Fletcher Anon, the patient will need an echo and ekg (on the same day as the echo) for pre op clearance in the next 1-2 weeks, please.  Thank you, Lattie Haw

## 2019-03-24 NOTE — Patient Instructions (Addendum)
Medication Instructions:  No Change in medications. If you need a refill on your cardiac medications before your next appointment, please call your pharmacy.   Lab work: None If you have labs (blood work) drawn today and your tests are completely normal, you will receive your results only by: Marland Kitchen MyChart Message (if you have MyChart) OR . A paper copy in the mail If you have any lab test that is abnormal or we need to change your treatment, we will call you to review the results.  Testing/Procedures: Your physician has requested that you have an echocardiogram and and EKG in 1-2 weeks. Echocardiography is a painless test that uses sound waves to create images of your heart. It provides your doctor with information about the size and shape of your heart and how well your heart's chambers and valves are working. You may receive an ultrasound enhancing agent through an IV if needed to better visualize your heart during the echo.This procedure takes approximately one hour. There are no restrictions for this procedure. This will take place at the [facility] clinic.    Follow-Up: Follow-up as needed if testing is abnormal or recurrent symptoms of arrhythmia.

## 2019-03-24 NOTE — Addendum Note (Signed)
Addended by: Ricci Barker on: 03/24/2019 11:28 AM   Modules accepted: Orders

## 2019-03-28 ENCOUNTER — Telehealth: Payer: Self-pay | Admitting: Gastroenterology

## 2019-03-28 NOTE — Telephone Encounter (Signed)
Received a fax from [facility] pt has NOT been cleared for procedure and is referred to Cardiologist

## 2019-03-29 LAB — C DIFFICILE TOXINS A+B W/RFLX: C difficile Toxins A+B, EIA: NEGATIVE

## 2019-03-29 LAB — C DIFFICILE, CYTOTOXIN B

## 2019-03-30 LAB — GI PROFILE, STOOL, PCR

## 2019-03-30 LAB — CELIAC DISEASE AB SCREEN W/RFX
Antigliadin Abs, IgA: 14 units (ref 0–19)
IgA/Immunoglobulin A, Serum: 126 mg/dL (ref 87–352)
Transglutaminase IgA: <2 U/mL (ref 0–3)

## 2019-03-30 LAB — TSH: TSH: 3.05 u[IU]/mL (ref 0.450–4.500)

## 2019-03-30 LAB — CALPROTECTIN, FECAL: Calprotectin, Fecal: <16 ug/g (ref 0–120)

## 2019-04-03 ENCOUNTER — Encounter: Payer: Self-pay | Admitting: Gastroenterology

## 2019-04-07 ENCOUNTER — Ambulatory Visit (INDEPENDENT_AMBULATORY_CARE_PROVIDER_SITE_OTHER): Payer: Self-pay

## 2019-04-07 ENCOUNTER — Other Ambulatory Visit: Payer: Self-pay

## 2019-04-07 VITALS — BP 124/80 | HR 60 | Ht 66.0 in | Wt 176.0 lb

## 2019-04-07 DIAGNOSIS — R0602 Shortness of breath: Secondary | ICD-10-CM

## 2019-04-07 DIAGNOSIS — Z01818 Encounter for other preprocedural examination: Secondary | ICD-10-CM

## 2019-04-07 DIAGNOSIS — Z01810 Encounter for preprocedural cardiovascular examination: Secondary | ICD-10-CM

## 2019-04-07 NOTE — Progress Notes (Signed)
Patient had a telemedicine visit with Dr.Arida on 03/24/19 for pre-operative clearance for an upcoming coloscopy.  See Dr.Arida's 03/24/19 documentation below:  1. Preoperative cardiovascular evaluation for colonoscopy: Patient reports history of ventricular tachycardia in the remote past with no recent documented arrhythmia.  She has mild palpitations overall.  I do think we have to make sure that her ejection fraction is normal.  I am going to obtain an echocardiogram and a repeat EKG.  If those are unremarkable, she can proceed with colonoscopy with an overall low risk from a cardiac standpoint.  It is reassuring that her functional capacity is good overall with no anginal symptoms. 2. Questionable history of ventricular tachycardia: Based on the patient's description but no records are available.  She is on atenolol.  If ejection fraction is normal, no further work-up is needed especially that she has not had any recent documented arrhythmia  She was seen in the office today for her echocardiogram and EKG was also performed.  Echo results are pending.  EKG demonstrated NSR with a ventricular rate of 60bpm. EKG reviewed by Dr. Fletcher Anon. No action required. Patient advised that she will be contacted with her echocardiogram results once available.

## 2019-04-10 ENCOUNTER — Telehealth: Payer: Self-pay

## 2019-04-10 NOTE — Telephone Encounter (Signed)
-----   Message from Wellington Hampshire, MD sent at 04/10/2019 12:10 PM EDT ----- Inform patient that echo was fine.  Normal ejection fraction with no significant valvular abnormalities.  She is at low risk for colonoscopy.

## 2019-04-10 NOTE — Telephone Encounter (Signed)
Pt made aware of echo results with verbal understanding. 

## 2019-04-12 ENCOUNTER — Other Ambulatory Visit: Payer: Medicaid Other

## 2019-04-13 ENCOUNTER — Telehealth: Payer: Self-pay

## 2019-04-13 NOTE — Telephone Encounter (Signed)
Called pt to schedule colonoscopy as pt has been cleared by cardiology to proceed with procedure.  Unable to contact, LVM to return call

## 2019-04-17 NOTE — Telephone Encounter (Signed)
Pt left vm to schedule a colonoscopy  She missed a call yesterday

## 2019-04-18 NOTE — Telephone Encounter (Signed)
Returned pt call to schedule colonoscopy.  Unable to contact, LVM to return call

## 2019-04-18 NOTE — Telephone Encounter (Signed)
Patient called & l/m on v/m she was calling to schedule a colonoscopy

## 2019-04-19 ENCOUNTER — Ambulatory Visit: Payer: Medicaid Other | Admitting: Internal Medicine

## 2019-04-19 ENCOUNTER — Other Ambulatory Visit: Payer: Self-pay | Admitting: Adult Health Nurse Practitioner

## 2019-04-19 NOTE — Telephone Encounter (Signed)
Returned pt call to schedule her colonoscopy.  Unable to contact, LVM to return call Will send MyChart message

## 2019-04-20 ENCOUNTER — Other Ambulatory Visit: Payer: Self-pay

## 2019-04-20 ENCOUNTER — Ambulatory Visit: Payer: Medicaid Other | Admitting: Urology

## 2019-04-20 DIAGNOSIS — Z1211 Encounter for screening for malignant neoplasm of colon: Secondary | ICD-10-CM

## 2019-04-20 DIAGNOSIS — K529 Noninfective gastroenteritis and colitis, unspecified: Secondary | ICD-10-CM

## 2019-04-20 MED ORDER — NA SULFATE-K SULFATE-MG SULF 17.5-3.13-1.6 GM/177ML PO SOLN: 1.0000 | Freq: Once | ORAL | 0 refills | Status: AC

## 2019-04-20 NOTE — Progress Notes (Signed)
Virtual Visit via Telephone Note  I connected with Shannon Schroeder  on 04/20/2019 at [place] by telephone and verified that I am speaking with the correct person using two identifiers.  They are located at home.   I am located at my home.    This visit type was conducted due to national recommendations for restrictions regarding the COVID-19 Pandemic (e.g. social distancing).  This format is felt to be most appropriate for this patient at this time.  All issues noted in this document were discussed and addressed.  No physical exam was performed.   I discussed the limitations, risks, security and privacy concerns of performing an evaluation and management service by telephone and the availability of in person appointments. I also discussed with the patient that there may be a patient responsible charge related to this service. The patient expressed understanding and agreed to proceed.   History of Present Illness: Mrs. Shannon Schroeder is a 63 year old female with chronic diarrhea, HTN, COPD and Bipolar disorder who is contacted via telephone for office visit.    She is scheduled for her colonoscopy and has no questions at this time.    She is having episodes of fatigue that happen on occasions.  She feels she gets adequate sleep, but she has times when she needs to take a long nap.  She states her roommate has not noted her snoring or witnessed apneic events.    She does not need refills at this time.    Observations/Objective: She is answering questions appropriately and does not sound distressed.    Assessment and Plan:  1. Chronic diarrhea Follow up after colonscopy   Follow Up Instructions:    I discussed the assessment and treatment plan with the patient. The patient was provided an opportunity to ask questions and all were answered. The patient agreed with the plan and demonstrated an understanding of the instructions.   The patient was advised to call back or seek an in-person  evaluation if the symptoms worsen or if the condition fails to improve as anticipated.  I provided 5 minutes of non-face-to-face time during this encounter.   Annaston Upham, PA-C

## 2019-04-21 ENCOUNTER — Other Ambulatory Visit
Admission: RE | Admit: 2019-04-21 | Discharge: 2019-04-21 | Disposition: A | Discharge status: Home / Self Care | Payer: HRSA Program | Source: Ambulatory Visit | Attending: Gastroenterology | Admitting: Gastroenterology

## 2019-04-21 DIAGNOSIS — Z1159 Encounter for screening for other viral diseases: Secondary | ICD-10-CM | POA: Insufficient documentation

## 2019-04-22 LAB — NOVEL CORONAVIRUS, NAA (HOSP ORDER, SEND-OUT TO REF LAB; TAT 18-24 HRS): SARS-CoV-2, NAA: NOT DETECTED

## 2019-04-24 NOTE — Telephone Encounter (Signed)
Spoke with pt last week and was able to schedule her procedure.

## 2019-04-26 ENCOUNTER — Encounter: Payer: Self-pay | Admitting: Anesthesiology

## 2019-04-26 ENCOUNTER — Encounter
Admission: RE | Disposition: A | Discharge status: Home / Self Care | Payer: Self-pay | Source: Home / Self Care | Attending: Gastroenterology

## 2019-04-26 ENCOUNTER — Ambulatory Visit: Payer: Self-pay | Admitting: Registered Nurse

## 2019-04-26 ENCOUNTER — Ambulatory Visit
Admission: RE | Admit: 2019-04-26 | Discharge: 2019-04-26 | Disposition: A | Discharge status: Home / Self Care | Payer: Self-pay | Attending: Gastroenterology | Admitting: Gastroenterology

## 2019-04-26 ENCOUNTER — Other Ambulatory Visit: Payer: Self-pay

## 2019-04-26 DIAGNOSIS — Z7951 Long term (current) use of inhaled steroids: Secondary | ICD-10-CM | POA: Insufficient documentation

## 2019-04-26 DIAGNOSIS — F319 Bipolar disorder, unspecified: Secondary | ICD-10-CM | POA: Insufficient documentation

## 2019-04-26 DIAGNOSIS — Z9842 Cataract extraction status, left eye: Secondary | ICD-10-CM | POA: Insufficient documentation

## 2019-04-26 DIAGNOSIS — K621 Rectal polyp: Secondary | ICD-10-CM | POA: Insufficient documentation

## 2019-04-26 DIAGNOSIS — Z886 Allergy status to analgesic agent status: Secondary | ICD-10-CM | POA: Insufficient documentation

## 2019-04-26 DIAGNOSIS — I499 Cardiac arrhythmia, unspecified: Secondary | ICD-10-CM | POA: Insufficient documentation

## 2019-04-26 DIAGNOSIS — Z90710 Acquired absence of both cervix and uterus: Secondary | ICD-10-CM | POA: Insufficient documentation

## 2019-04-26 DIAGNOSIS — M1990 Unspecified osteoarthritis, unspecified site: Secondary | ICD-10-CM | POA: Insufficient documentation

## 2019-04-26 DIAGNOSIS — Z823 Family history of stroke: Secondary | ICD-10-CM | POA: Insufficient documentation

## 2019-04-26 DIAGNOSIS — I671 Cerebral aneurysm, nonruptured: Secondary | ICD-10-CM | POA: Insufficient documentation

## 2019-04-26 DIAGNOSIS — I10 Essential (primary) hypertension: Secondary | ICD-10-CM | POA: Insufficient documentation

## 2019-04-26 DIAGNOSIS — Z888 Allergy status to other drugs, medicaments and biological substances status: Secondary | ICD-10-CM | POA: Insufficient documentation

## 2019-04-26 DIAGNOSIS — Z8249 Family history of ischemic heart disease and other diseases of the circulatory system: Secondary | ICD-10-CM | POA: Insufficient documentation

## 2019-04-26 DIAGNOSIS — Z825 Family history of asthma and other chronic lower respiratory diseases: Secondary | ICD-10-CM | POA: Insufficient documentation

## 2019-04-26 DIAGNOSIS — Z9841 Cataract extraction status, right eye: Secondary | ICD-10-CM | POA: Insufficient documentation

## 2019-04-26 DIAGNOSIS — J449 Chronic obstructive pulmonary disease, unspecified: Secondary | ICD-10-CM | POA: Insufficient documentation

## 2019-04-26 DIAGNOSIS — Z9049 Acquired absence of other specified parts of digestive tract: Secondary | ICD-10-CM | POA: Insufficient documentation

## 2019-04-26 DIAGNOSIS — Z79899 Other long term (current) drug therapy: Secondary | ICD-10-CM | POA: Insufficient documentation

## 2019-04-26 DIAGNOSIS — Z1211 Encounter for screening for malignant neoplasm of colon: Secondary | ICD-10-CM | POA: Insufficient documentation

## 2019-04-26 DIAGNOSIS — F17210 Nicotine dependence, cigarettes, uncomplicated: Secondary | ICD-10-CM | POA: Insufficient documentation

## 2019-04-26 DIAGNOSIS — F419 Anxiety disorder, unspecified: Secondary | ICD-10-CM | POA: Insufficient documentation

## 2019-04-26 HISTORY — DX: Chronic obstructive pulmonary disease, unspecified: J44.9

## 2019-04-26 HISTORY — PX: COLONOSCOPY WITH PROPOFOL: SHX5780

## 2019-04-26 SURGERY — COLONOSCOPY WITH PROPOFOL
Anesthesia: General

## 2019-04-26 MED ORDER — METOPROLOL TARTRATE 5 MG/5ML IV SOLN
INTRAVENOUS | Status: DC | PRN
  Administered 2019-04-26: 2 mg via INTRAVENOUS

## 2019-04-26 MED ORDER — LIDOCAINE HCL (CARDIAC) PF 100 MG/5ML IV SOSY
PREFILLED_SYRINGE | INTRAVENOUS | Status: DC | PRN
  Administered 2019-04-26: 40 mg via INTRAVENOUS

## 2019-04-26 MED ORDER — PROPOFOL 10 MG/ML IV BOLUS
INTRAVENOUS | Status: AC
  Filled 2019-04-26: qty 20

## 2019-04-26 MED ORDER — SODIUM CHLORIDE 0.9 % IV SOLN
INTRAVENOUS | Status: DC
  Administered 2019-04-26: 14:00:00 via INTRAVENOUS

## 2019-04-26 MED ORDER — PROPOFOL 500 MG/50ML IV EMUL
INTRAVENOUS | Status: DC | PRN
  Administered 2019-04-26: 50 ug/kg/min via INTRAVENOUS

## 2019-04-26 MED ORDER — LIDOCAINE HCL (PF) 2 % IJ SOLN
INTRAMUSCULAR | Status: AC
  Filled 2019-04-26: qty 10

## 2019-04-26 MED ORDER — PROPOFOL 10 MG/ML IV BOLUS
INTRAVENOUS | Status: AC
  Filled 2019-04-26: qty 40

## 2019-04-26 MED ORDER — MIDAZOLAM HCL 2 MG/2ML IJ SOLN
INTRAMUSCULAR | Status: AC
  Filled 2019-04-26: qty 2

## 2019-04-26 MED ORDER — PROPOFOL 10 MG/ML IV BOLUS
INTRAVENOUS | Status: DC | PRN
  Administered 2019-04-26: 40 mg via INTRAVENOUS

## 2019-04-26 NOTE — Transfer of Care (Signed)
Immediate Anesthesia Transfer of Care Note  Patient: Shannon Schroeder  Procedure(s) Performed: COLONOSCOPY WITH PROPOFOL (N/A )  Patient Location: PACU  Anesthesia Type:General  Level of Consciousness: awake, alert  and oriented  Airway & Oxygen Therapy: Patient Spontanous Breathing  Post-op Assessment: Report given to RN and Post -op Vital signs reviewed and stable  Post vital signs: Reviewed and stable  Last Vitals:  Vitals Value Taken Time  BP    Temp    Pulse 97 04/26/19 1426  Resp 32 04/26/19 1426  SpO2 100 % 04/26/19 1426  Vitals shown include unvalidated device data.  Last Pain:  Vitals:   04/26/19 1324  PainSc: 0-No pain         Complications: Patient re-intubated

## 2019-04-26 NOTE — Anesthesia Preprocedure Evaluation (Signed)
Anesthesia Evaluation  Patient identified by MRN, date of birth, ID band Patient awake    Reviewed: Allergy & Precautions, NPO status , Patient's Chart, lab work & pertinent test results, reviewed documented beta blocker date and time   Airway Mallampati: III  TM Distance: >3 FB     Dental  (+) Chipped   Pulmonary COPD, Current Smoker,           Cardiovascular hypertension, Pt. on medications + dysrhythmias Ventricular Tachycardia      Neuro/Psych PSYCHIATRIC DISORDERS Anxiety Depression Bipolar Disorder TIA   GI/Hepatic   Endo/Other    Renal/GU      Musculoskeletal  (+) Arthritis ,   Abdominal   Peds  Hematology   Anesthesia Other Findings Takes B-blockers. ECHO 60-65.  Reproductive/Obstetrics                             Anesthesia Physical Anesthesia Plan  ASA: III  Anesthesia Plan: General   Post-op Pain Management:    Induction: Intravenous  PONV Risk Score and Plan:   Airway Management Planned:   Additional Equipment:   Intra-op Plan:   Post-operative Plan:   Informed Consent: I have reviewed the patients History and Physical, chart, labs and discussed the procedure including the risks, benefits and alternatives for the proposed anesthesia with the patient or authorized representative who has indicated his/her understanding and acceptance.       Plan Discussed with: CRNA  Anesthesia Plan Comments:         Anesthesia Quick Evaluation

## 2019-04-26 NOTE — Op Note (Signed)
[facility] Gastroenterology Patient Name: Shannon Schroeder Procedure Date: 04/26/2019 1:45 PM MRN: [id number] Account #: [id number] Date of Birth: May 26, 1956 Admit Type: Outpatient Age: 63 Room: [facility] ENDO ROOM 2 Gender: Female Note Status: Finalized Procedure:            Colonoscopy Indications:          Screening for colorectal malignant neoplasm Providers:            Jonathon Bellows MD, MD Medicines:            Monitored Anesthesia Care Complications:        No immediate complications. Procedure:            Pre-Anesthesia Assessment:                       - Prior to the procedure, a History and Physical was                        performed, and patient medications, allergies and                        sensitivities were reviewed. The patient's tolerance of                        previous anesthesia was reviewed.                       - The risks and benefits of the procedure and the                        sedation options and risks were discussed with the                        patient. All questions were answered and informed                        consent was obtained.                       - ASA Grade Assessment: II - A patient with mild                        systemic disease.                       After obtaining informed consent, the colonoscope was                        passed under direct vision. Throughout the procedure,                        the patient's blood pressure, pulse, and oxygen                        saturations were monitored continuously. The                        Colonoscope was introduced through the anus and                        advanced to the the cecum, identified by the  appendiceal orifice, IC valve and transillumination.                        The colonoscopy was performed with ease. The patient                        tolerated the procedure well. The quality of the bowel                        preparation  was good. Findings:      The perianal and digital rectal examinations were normal.      A 4 mm polyp was found in the rectum. The polyp was sessile. The polyp       was removed with a cold snare. Resection and retrieval were complete.      No additional abnormalities were found on retroflexion.      The exam was otherwise without abnormality on direct and retroflexion       views. Impression:           - One 4 mm polyp in the rectum, removed with a cold                        snare. Resected and retrieved.                       - The examination was otherwise normal on direct and                        retroflexion views. Recommendation:       - Discharge patient to home (with escort).                       - Resume previous diet.                       - Continue present medications.                       - Await pathology results.                       - Repeat colonoscopy for surveillance based on                        pathology results. Procedure Code(s):    --- Professional ---                       [phone], Colonoscopy, flexible; with removal of tumor(s),                        polyp(s), or other lesion(s) by snare technique Diagnosis Code(s):    --- Professional ---                       Z12.11, Encounter for screening for malignant neoplasm                        of colon                       K62.1, Rectal polyp CPT copyright 2019 American Medical Association. All rights reserved. The codes documented in  this report are preliminary and upon coder review may  be revised to meet current compliance requirements. Jonathon Bellows, MD Jonathon Bellows MD, MD 04/26/2019 2:19:49 PM This report has been signed electronically. Number of Addenda: 0 Note Initiated On: 04/26/2019 1:45 PM Scope Withdrawal Time: 0 hours 11 minutes 28 seconds  Total Procedure Duration: 0 hours 16 minutes 57 seconds  Estimated Blood Loss: Estimated blood loss: none.      [facility]

## 2019-04-26 NOTE — H&P (Signed)
Jonathon Bellows, MD [address] [place], [place], [place], [address] [address], [place], [place], [place], [address] Phone: [phone]  Fax: [phone]  Primary Care Physician:  Tawni Millers, MD   Pre-Procedure History & Physical: HPI:  Shannon Schroeder is a 63 y.o. female is here for an colonoscopy.   Past Medical History:  Diagnosis Date  . 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   Marland Kitchen Anxiety   . Arthritis   . Bipolar disorder ([place])   . COPD (chronic obstructive pulmonary disease) ([place])   . Depression   . Dysrhythmia    H/O V TACH  . Hypertension     Past Surgical History:  Procedure Laterality Date  . ABDOMINAL HYSTERECTOMY     partial  . BREAST SURGERY     biopsy  . CARDIAC CATHETERIZATION     X 2  . CARPECTOMY HAND Right   . CATARACT EXTRACTION W/PHACO Left 03/08/2018   Procedure: CATARACT EXTRACTION PHACO AND INTRAOCULAR LENS PLACEMENT (IOC);  Surgeon: Birder Robson, MD;  Location: [facility] ORS;  Service: Ophthalmology;  Laterality: Left;  [place] 00:55 AP% 13.5 CDE 7.52 Fluid pack lot # [id number]  . CATARACT EXTRACTION W/PHACO Right 04/05/2018   Procedure: CATARACT EXTRACTION PHACO AND INTRAOCULAR LENS PLACEMENT (IOC);  Surgeon: Birder Robson, MD;  Location: [facility] ORS;  Service: Ophthalmology;  Laterality: Right;  [place] 00:36 AP% 15.9 CDE 5.72 Fluid pack lot # [id number]  . CHOLECYSTECTOMY    . TUBAL LIGATION      Prior to Admission medications   Medication Sig Start Date End Date Taking? Authorizing Provider  amLODipine (NORVASC) 5 MG tablet Take 1 tablet (5 mg total) by mouth daily. 09/28/18  Yes Tawni Millers, MD  atenolol (TENORMIN) 25 MG tablet Take 1 tablet (25 mg total) by mouth 2 (two) times daily. 12/28/18  Yes Tawni Millers, MD  Brexpiprazole (REXULTI) 1 MG TABS Take 1 mg by mouth at bedtime.   Yes [provider]  hydrOXYzine (VISTARIL) 25 MG capsule Take 25 mg by mouth 2 (two) times  daily as needed.   Yes [provider]  lamoTRIgine (LAMICTAL) 100 MG tablet Take 50 mg by mouth at bedtime.    Yes [provider]  lisinopril (PRINIVIL,ZESTRIL) 40 MG tablet Take 1 tablet (40 mg total) by mouth daily. 09/28/18  Yes Tawni Millers, MD  tiotropium (SPIRIVA) 18 MCG inhalation capsule Place 1 capsule (18 mcg total) into inhaler and inhale daily. 04/21/18  Yes Doles-Johnson, Teah, NP  VENTOLIN HFA 108 (90 Base) MCG/ACT inhaler INHALE 1 TO 2 PUFFS EVERY 6 HOURS AS NEEDED FOR WHEEZING OR SHORTNESSOF BREATH 04/19/19  Yes Doles-Johnson, Teah, NP  loratadine (CLARITIN) 10 MG tablet Take 1 tablet (10 mg total) by mouth daily for 21 days. 06/29/18 04/07/19  Tawni Millers, MD    Allergies as of 04/20/2019 - Review Complete 04/07/2019  Allergen Reaction Noted  . Fluoxetine Other (See Comments) 06/16/2018  . Aspirin Swelling and Other (See Comments) 11/03/2017  . Belladonna alkaloids Rash 04/13/2014  . Fluoxetine hcl Other (See Comments) and Rash 08/26/2013  . Naproxen Swelling, Rash, and Other (See Comments) 11/03/2017  . Paroxetine hcl Rash and Other (See Comments) 11/03/2017  . Phenobarbital Rash and Other (See Comments) 11/03/2017  . Prozac [fluoxetine hcl] Rash 11/03/2017    Family History  Problem Relation Age of Onset  . Hypertension Mother   . Heart failure Mother   .  Hypertension Brother   . Stroke Brother   . Hypertension Son   . Emphysema Maternal Aunt   . Hypertension Paternal Aunt   . Hypertension Paternal Uncle   . Hypertension Maternal Grandmother   . Hypertension Paternal Grandmother     Social History   Socioeconomic History  . Marital status: Divorced    Spouse name: Not on file  . Number of children: Not on file  . Years of education: 49  . Highest education level: Bachelor's degree (e.g., BA, AB, BS)  Occupational History  . Occupation: farmer  Social Needs  . Financial resource strain: Not very hard  . Food insecurity    Worry:  Never true    Inability: Never true  . Transportation needs    Medical: No    Non-medical: No  Tobacco Use  . Smoking status: Current Every Day Smoker    Packs/day: 1.00    Types: Cigarettes  . Smokeless tobacco: Never Used  Substance and Sexual Activity  . Alcohol use: No    Frequency: Never  . Drug use: No  . Sexual activity: Not Currently  Lifestyle  . Physical activity    Days per week: 3 days    Minutes per session: 150+ min  . Stress: Only a little  Relationships  . Social Herbalist on phone: Three times a week    Gets together: More than three times a week    Attends religious service: More than 4 times per year    Active member of club or organization: No    Attends meetings of clubs or organizations: Never    Relationship status: Divorced  . Intimate partner violence    Fear of current or ex partner: No    Emotionally abused: No    Physically abused: No    Forced sexual activity: No  Other Topics Concern  . Not on file  Social History Narrative   Pt lives at [facility].     Review of Systems: See HPI, otherwise negative ROS  Physical Exam: BP 103/88   Pulse (!) 103   Temp 98.7 F (37.1 C)   Resp 20   Ht 5\' 6"  (1.676 m)   Wt 79.4 kg   LMP 11/19/1988 (Exact Date)   SpO2 98%   BMI 28.25 kg/m  General:   Alert,  pleasant and cooperative in NAD Head:  Normocephalic and atraumatic. Neck:  Supple; no masses or thyromegaly. Lungs:  Clear throughout to auscultation, normal respiratory effort.    Heart:  +S1, +S2, Regular rate and rhythm, No edema. Abdomen:  Soft, nontender and nondistended. Normal bowel sounds, without guarding, and without rebound.   Neurologic:  Alert and  oriented x4;  grossly normal neurologically.  Impression/Plan: Shannon Schroeder is here for an colonoscopy to be performed for Screening colonoscopy average risk   Risks, benefits, limitations, and alternatives regarding  colonoscopy have been reviewed with the  patient.  Questions have been answered.  All parties agreeable.   Jonathon Bellows, MD  04/26/2019, 1:44 PM

## 2019-04-26 NOTE — Anesthesia Postprocedure Evaluation (Signed)
Anesthesia Post Note  Patient: Shannon Schroeder  Procedure(s) Performed: COLONOSCOPY WITH PROPOFOL (N/A )  Patient location during evaluation: Endoscopy Anesthesia Type: General Level of consciousness: awake and alert Pain management: pain level controlled Vital Signs Assessment: post-procedure vital signs reviewed and stable Respiratory status: spontaneous breathing, nonlabored ventilation, respiratory function stable and patient connected to nasal cannula oxygen Cardiovascular status: blood pressure returned to baseline and stable Postop Assessment: no apparent nausea or vomiting Anesthetic complications: no     Last Vitals:  Vitals:   04/26/19 1437 04/26/19 1447  BP: 109/82 113/89  Pulse: 92   Resp: 18 15  Temp:    SpO2: 100% 100%    Last Pain:  Vitals:   04/26/19 1447  TempSrc:   PainSc: 0-No pain                 Makya Yurko S

## 2019-04-26 NOTE — Anesthesia Post-op Follow-up Note (Signed)
Anesthesia QCDR form completed.        

## 2019-04-27 ENCOUNTER — Encounter: Payer: Self-pay | Admitting: Gastroenterology

## 2019-04-28 LAB — SURGICAL PATHOLOGY

## 2019-05-01 ENCOUNTER — Ambulatory Visit (INDEPENDENT_AMBULATORY_CARE_PROVIDER_SITE_OTHER): Payer: Self-pay | Admitting: Gastroenterology

## 2019-05-01 ENCOUNTER — Encounter: Payer: Self-pay | Admitting: Gastroenterology

## 2019-05-01 ENCOUNTER — Other Ambulatory Visit: Payer: Self-pay

## 2019-05-01 VITALS — BP 106/58 | HR 77 | Temp 97.9°F | Ht 66.0 in | Wt 175.8 lb

## 2019-05-01 DIAGNOSIS — K580 Irritable bowel syndrome with diarrhea: Secondary | ICD-10-CM

## 2019-05-01 MED ORDER — RIFAXIMIN 550 MG PO TABS: 550.0000 mg | ORAL_TABLET | Freq: Two times a day (BID) | ORAL | 0 refills | Status: AC

## 2019-05-01 NOTE — Progress Notes (Signed)
Jonathon Bellows MD, MRCP(U.K) [address]  [place]  [place], [place] [address]  Main: [phone]  Fax: [phone]   Primary Care Physician: Tawni Millers, MD   Primary Gastroenterologist:  Dr. Jonathon Bellows   Diarrhea follow up   HPI: Shannon Schroeder is a 63 y.o. female   Summary of history :  She was initially referred and seen on 03/01/2019 for diarrhea of 5 to 6 months in duration.  [facility] Bentyl which had slowed it down.  6 bowel movements a day, watery and loose, no shape.  Last endoscopy was over 10 years back and she recalls was normal.  No artificial sugar sodas or chewing gum in her diet.  She has been on Advil 2 tablets/day or ibuprofen for at least 15 years. 01/2019: Stool ova and parasite were negative, CBC, CMP were normal.  Interval history 03/20/2019-05/01/2019  03/24/2019: GI PCR, C diff, Calprotectin, TSH,celiac serology-normal/negative.   04/26/2019: colonoscopy : 4 mm rectal polyp-tubular adenoma   After the last visit stool test for GI PCR fecal calprotectin and celiac disease were ordered but have not been obtained.  She says has had some diarrhea- She goes from 4-6 times a day - sloppy in consistency . On the day of the procedure the diarrhea had resolved but since has recurrence. No artificial sugars in the diet. No bloating    Current Outpatient Medications  Medication Sig Dispense Refill  . amLODipine (NORVASC) 5 MG tablet Take 1 tablet (5 mg total) by mouth daily. 90 tablet 3  . atenolol (TENORMIN) 25 MG tablet Take 1 tablet (25 mg total) by mouth 2 (two) times daily. 180 tablet 3  . Brexpiprazole (REXULTI) 1 MG TABS Take 1 mg by mouth at bedtime.    . hydrOXYzine (VISTARIL) 25 MG capsule Take 25 mg by mouth 2 (two) times daily as needed.    . lamoTRIgine (LAMICTAL) 100 MG tablet Take 50 mg by mouth at bedtime.     Marland Kitchen lisinopril (PRINIVIL,ZESTRIL) 40 MG tablet Take 1 tablet (40 mg total) by mouth daily.  90 tablet 3  . loratadine (CLARITIN) 10 MG tablet Take 1 tablet (10 mg total) by mouth daily for 21 days. 21 tablet 0  . tiotropium (SPIRIVA) 18 MCG inhalation capsule Place 1 capsule (18 mcg total) into inhaler and inhale daily. 30 capsule 12  . VENTOLIN HFA 108 (90 Base) MCG/ACT inhaler INHALE 1 TO 2 PUFFS EVERY 6 HOURS AS NEEDED FOR WHEEZING OR SHORTNESSOF BREATH 18 g 0   No current facility-administered medications for this visit.     Allergies as of 05/01/2019 - Review Complete 04/26/2019  Allergen Reaction Noted  . Fluoxetine Other (See Comments) 06/16/2018  . Aspirin Swelling and Other (See Comments) 11/03/2017  . Belladonna alkaloids Rash 04/13/2014  . Fluoxetine hcl Other (See Comments) and Rash 08/26/2013  . Naproxen Swelling, Rash, and Other (See Comments) 11/03/2017  . Paroxetine hcl Rash and Other (See Comments) 11/03/2017  . Phenobarbital Rash and Other (See Comments) 11/03/2017  . Prozac [fluoxetine hcl] Rash 11/03/2017    ROS:  General: Negative for anorexia, weight loss, fever, chills, fatigue, weakness. ENT: Negative for hoarseness, difficulty swallowing , nasal congestion. CV: Negative for chest pain, angina, palpitations, dyspnea on exertion, peripheral edema.  Respiratory: Negative for dyspnea at rest, dyspnea on exertion, cough, sputum, wheezing.  GI: See history of present illness. GU:  Negative for dysuria, hematuria, urinary incontinence, urinary frequency, nocturnal urination.  Endo:  Negative for unusual weight change.    Physical Examination:   LMP 11/19/1988 (Exact Date)   General: Well-nourished, well-developed in no acute distress.  Eyes: No icterus. Conjunctivae pink. Mouth: Oropharyngeal mucosa moist and pink , no lesions erythema or exudate. Lungs: Clear to auscultation bilaterally. Non-labored. Heart: Regular rate and rhythm, no murmurs rubs or gallops.  Abdomen: Bowel sounds are normal, nontender, nondistended, no hepatosplenomegaly or masses,  no abdominal bruits or hernia , no rebound or guarding.   Extremities: No lower extremity edema. No clubbing or deformities. Neuro: Alert and oriented x 3.  Grossly intact. Skin: Warm and dry, no jaundice.   Psych: Alert and cooperative, normal mood and affect.   Imaging Studies: No results found.  Assessment and Plan:   Shannon Schroeder is a 63 y.o. y/o female  here to follow-up for chronic diarrhea and lonmg term nsaid use. Diarrhea persists despite NSAID cessation .colonoscopyand stool studies are normal. Likely IBS-D /    Plan  1.Trial of Xifaxan for IBS-D if fails try pediatric imodium drops titrated to desired consitency of stool.  2. No NSAID's  3. Low FODMAP diet   Dr Jonathon Bellows  MD,MRCP [facility]) Follow up in 6 weeks

## 2019-05-01 NOTE — Addendum Note (Signed)
Addended by: Dorethea Clan on: 05/01/2019 01:06 PM   Modules accepted: Orders

## 2019-05-02 ENCOUNTER — Encounter: Payer: Self-pay | Admitting: Gastroenterology

## 2019-05-25 ENCOUNTER — Telehealth: Payer: Self-pay | Admitting: Pharmacist

## 2019-05-25 NOTE — Telephone Encounter (Signed)
05/25/2019 8:52:09 AM - Ventolin renewal due-2nd letter to pt  05/25/2019 Time for patient to renew for Ventolin with [facility], previously (02/21/2019) mailed letter to patient with portion of application to sign & return, pt has not returned, I am mailing a second letter to patient. Also sending a script to [facility] for provider to sign with current date.Delos Haring

## 2019-06-01 ENCOUNTER — Telehealth: Payer: Self-pay | Admitting: Pharmacist

## 2019-06-01 NOTE — Telephone Encounter (Signed)
06/01/2019 11:47:31 AM - Ventolin Pending-pt to return form  06/01/2019 I have received the signed script for Ventolin back from [facility]--holding for patient to return her portion of [place] application for renewal--mailed to patient 02/21/2019 & 05/25/2019.Delos Haring

## 2019-06-12 ENCOUNTER — Ambulatory Visit: Payer: Medicaid Other | Admitting: Gastroenterology

## 2019-07-14 ENCOUNTER — Other Ambulatory Visit: Payer: Self-pay | Admitting: Gerontology

## 2019-09-07 ENCOUNTER — Telehealth: Payer: Self-pay | Admitting: Pharmacist

## 2019-09-07 NOTE — Telephone Encounter (Signed)
09/07/2019 8:34:30 AM - Spiriva pending  09/07/2019 I have received the signed provider portion back for Spiriva--holding for patient to return her portion--mailed to patient 09/06/2019.Shannon Schroeder

## 2019-09-12 ENCOUNTER — Encounter: Payer: Self-pay | Admitting: Gerontology

## 2019-09-12 ENCOUNTER — Other Ambulatory Visit: Payer: Self-pay

## 2019-09-12 ENCOUNTER — Ambulatory Visit: Payer: Medicaid Other | Admitting: Gerontology

## 2019-09-12 VITALS — BP 122/72 | HR 72 | Ht 66.0 in | Wt 166.0 lb

## 2019-09-12 DIAGNOSIS — K05219 Aggressive periodontitis, localized, unspecified severity: Secondary | ICD-10-CM

## 2019-09-12 DIAGNOSIS — Z0000 Encounter for general adult medical examination without abnormal findings: Secondary | ICD-10-CM

## 2019-09-12 DIAGNOSIS — I10 Essential (primary) hypertension: Secondary | ICD-10-CM

## 2019-09-12 MED ORDER — AMOXICILLIN-POT CLAVULANATE 875-125 MG PO TABS: 1.0000 | ORAL_TABLET | Freq: Two times a day (BID) | ORAL | 0 refills | Status: DC

## 2019-09-12 MED ORDER — LISINOPRIL 20 MG PO TABS: 20.0000 mg | ORAL_TABLET | Freq: Every day | ORAL | 2 refills | Status: DC

## 2019-09-12 NOTE — Progress Notes (Signed)
Established Patient Office Visit  Subjective:  Patient ID: Shannon Schroeder, female    DOB: 05-Sep-1956  Age: 63 y.o. MRN: [id number]  CC:  Chief Complaint  Patient presents with  . Hypertension  . COPD    HPI Shannon Schroeder presents for follow up of hypertension, COPD, and chronic diarrhea. She states that she has being experiencing intermittent light headedness and her SBP has being in the low 100's for the past 1 month. She states that she's compliant with her medications, and continues to smoke 1/2 to 3/4 of cigarettes daily and admits the desire to quit. She reports that her breathing is stable and doesn't use her Spiriva and Albuterol inhaler. She was seen by Dr Bailey Mech A on 05/01/2019 for IBS with Diarrhea, she was started on Xifaxan and was lost to follow up. She denies having loose or watery stool for more than one month. She states that she moves her bowel 2-3 times daily but her stool is soft in consistency and she never took the Xifaxan. She reports that she's being experiencing minimal amount of purulent yellowish drainage from her right upper tooth sites that has being going on for 3 weeks, and she has upper plate. She endorses pain to her right maxillary sinus, she denies fever and chills. She reports that she's doing well and offers no further complaint.  Past Medical History:  Diagnosis Date  . 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   Marland Kitchen Anxiety   . Arthritis   . Bipolar disorder ([place])   . COPD (chronic obstructive pulmonary disease) ([place])   . Depression   . Dysrhythmia    H/O V TACH  . Hypertension     Past Surgical History:  Procedure Laterality Date  . ABDOMINAL HYSTERECTOMY     partial  . BREAST SURGERY     biopsy  . CARDIAC CATHETERIZATION     X 2  . CARPECTOMY HAND Right   . CATARACT EXTRACTION W/PHACO Left 03/08/2018   Procedure: CATARACT EXTRACTION PHACO AND INTRAOCULAR LENS PLACEMENT (IOC);  Surgeon:  Birder Robson, MD;  Location: [facility] ORS;  Service: Ophthalmology;  Laterality: Left;  [place] 00:55 AP% 13.5 CDE 7.52 Fluid pack lot # [id number]  . CATARACT EXTRACTION W/PHACO Right 04/05/2018   Procedure: CATARACT EXTRACTION PHACO AND INTRAOCULAR LENS PLACEMENT (IOC);  Surgeon: Birder Robson, MD;  Location: [facility] ORS;  Service: Ophthalmology;  Laterality: Right;  [place] 00:36 AP% 15.9 CDE 5.72 Fluid pack lot # [id number]  . CHOLECYSTECTOMY    . COLONOSCOPY WITH PROPOFOL N/A 04/26/2019   Procedure: COLONOSCOPY WITH PROPOFOL;  Surgeon: Jonathon Bellows, MD;  Location: [facility] ENDOSCOPY;  Service: Gastroenterology;  Laterality: N/A;  . TUBAL LIGATION      Family History  Problem Relation Age of Onset  . Hypertension Mother   . Heart failure Mother   . Hypertension Brother   . Stroke Brother   . Hypertension Son   . Emphysema Maternal Aunt   . Hypertension Paternal Aunt   . Hypertension Paternal Uncle   . Hypertension Maternal Grandmother   . Hypertension Paternal Grandmother     Social History   Socioeconomic History  . Marital status: Divorced    Spouse name: Not on file  . Number of children: Not on file  . Years of education: 2  . Highest education level: Bachelor's degree (e.g., BA, AB, BS)  Occupational History  . Occupation: farmer  Social Needs  . Financial resource strain: Not  very hard  . Food insecurity    Worry: Never true    Inability: Never true  . Transportation needs    Medical: No    Non-medical: No  Tobacco Use  . Smoking status: Current Every Day Smoker    Packs/day: 1.00    Types: Cigarettes  . Smokeless tobacco: Never Used  Substance and Sexual Activity  . Alcohol use: No    Frequency: Never  . Drug use: No  . Sexual activity: Not Currently  Lifestyle  . Physical activity    Days per week: 3 days    Minutes per session: 150+ min  . Stress: Only a little  Relationships  . Social Herbalist on phone: Three times a week    Gets together: More  than three times a week    Attends religious service: More than 4 times per year    Active member of club or organization: No    Attends meetings of clubs or organizations: Never    Relationship status: Divorced  . Intimate partner violence    Fear of current or ex partner: No    Emotionally abused: No    Physically abused: No    Forced sexual activity: No  Other Topics Concern  . Not on file  Social History Narrative   Pt lives at [facility].     Outpatient Medications Prior to Visit  Medication Sig Dispense Refill  . amLODipine (NORVASC) 5 MG tablet Take 1 tablet (5 mg total) by mouth daily. 90 tablet 3  . atenolol (TENORMIN) 25 MG tablet Take 1 tablet (25 mg total) by mouth 2 (two) times daily. 180 tablet 3  . lisinopril (PRINIVIL,ZESTRIL) 40 MG tablet Take 1 tablet (40 mg total) by mouth daily. 90 tablet 3  . VENTOLIN HFA 108 (90 Base) MCG/ACT inhaler INHALE 1 TO 2 PUFFS EVERY 6 HOURS AS NEEDED FOR WHEEZING OR SHORTNESSOF BREATH (Patient not taking: Reported on 09/12/2019) 18 g 0  . Brexpiprazole (REXULTI) 1 MG TABS Take 1 mg by mouth at bedtime.    . hydrOXYzine (VISTARIL) 25 MG capsule Take 25 mg by mouth 2 (two) times daily as needed.    . lamoTRIgine (LAMICTAL) 100 MG tablet Take 50 mg by mouth at bedtime.     Marland Kitchen loratadine (CLARITIN) 10 MG tablet Take 1 tablet (10 mg total) by mouth daily for 21 days. 21 tablet 0  . tiotropium (SPIRIVA) 18 MCG inhalation capsule Place 1 capsule (18 mcg total) into inhaler and inhale daily. (Patient not taking: Reported on 09/12/2019) 30 capsule 12   No facility-administered medications prior to visit.     Allergies  Allergen Reactions  . Fluoxetine Other (See Comments)  . Aspirin Swelling and Other (See Comments)  . Belladonna Alkaloids Rash  . Fluoxetine Hcl Other (See Comments) and Rash  . Naproxen Swelling, Rash and Other (See Comments)  . Paroxetine Hcl Rash and Other (See Comments)  . Phenobarbital Rash and Other (See  Comments)  . Prozac [Fluoxetine Hcl] Rash    ROS Review of Systems  HENT: Positive for dental problem.   Eyes: Negative.   Respiratory: Negative.   Cardiovascular: Negative.   Skin: Negative.   Neurological: Positive for light-headedness. Negative for dizziness.  Psychiatric/Behavioral: Negative.       Objective:    Physical Exam  Constitutional: She is oriented to person, place, and time. She appears well-developed.  HENT:  Head: Normocephalic and atraumatic.  Mouth/Throat: She has dentures (Upper plate).  Eyes: Pupils are equal, round, and reactive to light. EOM are normal.  Cardiovascular: Normal rate and regular rhythm.  Pulmonary/Chest: Effort normal and breath sounds normal.  Neurological: She is alert and oriented to person, place, and time.  Skin: Skin is warm and dry.  Psychiatric: She has a normal mood and affect. Her behavior is normal. Judgment and thought content normal.    BP 122/72 (BP Location: Left Arm, Patient Position: Sitting)   Pulse 72   Ht 5\' 6"  (1.676 m)   Wt 166 lb (75.3 kg)   LMP 11/19/1988 (Exact Date)   BMI 26.79 kg/m  Wt Readings from Last 3 Encounters:  09/12/19 166 lb (75.3 kg)  05/01/19 175 lb 12.8 oz (79.7 kg)  04/26/19 175 lb (79.4 kg)     Health Maintenance Due  Topic Date Due  . Hepatitis C Screening  08/24/1956  . TETANUS/TDAP  05/03/1974  . PAP SMEAR-Modifier  05/03/1976  . MAMMOGRAM  07/17/2017  . INFLUENZA VACCINE  06/03/2019    There are no preventive care reminders to display for this patient.  Lab Results  Component Value Date   TSH 3.050 03/24/2019   Lab Results  Component Value Date   WBC 8.5 03/15/2019   HGB 13.8 03/15/2019   HCT 39.0 03/15/2019   MCV 96 03/15/2019   PLT 325 03/15/2019   Lab Results  Component Value Date   NA 135 03/15/2019   K 4.2 03/15/2019   CO2 23 03/15/2019   GLUCOSE 87 03/15/2019   BUN 12 03/15/2019   CREATININE 0.65 03/15/2019   BILITOT <0.2 03/15/2019   ALKPHOS 127 (H)  03/15/2019   AST 10 03/15/2019   ALT 10 03/15/2019   PROT 6.8 03/15/2019   ALBUMIN 4.5 03/15/2019   CALCIUM 9.5 03/15/2019   ANIONGAP 8 06/15/2018   Lab Results  Component Value Date   CHOL 180 12/21/2018   Lab Results  Component Value Date   HDL 71 12/21/2018   Lab Results  Component Value Date   LDLCALC 92 12/21/2018   Lab Results  Component Value Date   TRIG 84 12/21/2018   Lab Results  Component Value Date   CHOLHDL 2.5 12/21/2018   Lab Results  Component Value Date   HGBA1C 4.9 12/18/2016      Assessment & Plan:    1. Hypertension, unspecified type - Her blood pressure is controlled and her Lisinopril was decreased to 20 mg, she was advised to check and record and bring blood pressure log to follow up appointment. She was advised to continue on Dash diet, exercise as tolerated. - lisinopril (ZESTRIL) 20 MG tablet; Take 1 tablet (20 mg total) by mouth daily.  Dispense: 30 tablet; Refill: 2  2. Health care maintenance  - Flu Vaccine QUAD 6+ mos PF IM (Fluarix Quad PF) was administered - Ambulatory referral to Hematology / Oncology for Mammogram  3. Abscess of upper gum - She will continue on antibiotics, was educated on side effects and advised to notify clinic. She was encouraged to perform good dental hygiene. - amoxicillin-clavulanate (AUGMENTIN) 875-125 MG tablet; Take 1 tablet by mouth 2 (two) times daily.  Dispense: 6 tablet; Refill: 0 - She was encouraged to complete Dental application for Ambulatory referral to Dentistry    Follow-up: Return in about 2 months (around 11/12/2019), or if symptoms worsen or fail to improve.    Bralyn Espino Jerold Coombe, NP

## 2019-09-12 NOTE — Patient Instructions (Signed)

## 2019-11-14 ENCOUNTER — Encounter: Payer: Self-pay | Admitting: Gerontology

## 2019-11-14 ENCOUNTER — Ambulatory Visit: Payer: Medicaid Other | Admitting: Gerontology

## 2019-11-14 ENCOUNTER — Other Ambulatory Visit: Payer: Self-pay

## 2019-11-14 DIAGNOSIS — Z0000 Encounter for general adult medical examination without abnormal findings: Secondary | ICD-10-CM

## 2019-11-14 DIAGNOSIS — Z720 Tobacco use: Secondary | ICD-10-CM

## 2019-11-14 DIAGNOSIS — I10 Essential (primary) hypertension: Secondary | ICD-10-CM

## 2019-11-14 MED ORDER — ATENOLOL 25 MG PO TABS: 25.0000 mg | ORAL_TABLET | Freq: Two times a day (BID) | ORAL | 3 refills | Status: DC

## 2019-11-14 MED ORDER — AMLODIPINE BESYLATE 5 MG PO TABS: 5.0000 mg | ORAL_TABLET | Freq: Every day | ORAL | 1 refills | Status: DC

## 2019-11-14 MED ORDER — LISINOPRIL 20 MG PO TABS: 20.0000 mg | ORAL_TABLET | Freq: Every day | ORAL | 1 refills | Status: DC

## 2019-11-14 NOTE — Patient Instructions (Signed)
Smoking Tobacco Information, Adult Smoking tobacco can be harmful to your health. Tobacco contains a poisonous (toxic), colorless chemical called nicotine. Nicotine is addictive. It changes the brain and can make it hard to stop smoking. Tobacco also has other toxic chemicals that can hurt your body and raise your risk of many cancers. How can smoking tobacco affect me? Smoking tobacco puts you at risk for:  Cancer. Smoking is most commonly associated with lung cancer, but can also lead to cancer in other parts of the body.  Chronic obstructive pulmonary disease (COPD). This is a long-term lung condition that makes it hard to breathe. It also gets worse over time.  High blood pressure (hypertension), heart disease, stroke, or heart attack.  Lung infections, such as pneumonia.  Cataracts. This is when the lenses in the eyes become clouded.  Digestive problems. This may include peptic ulcers, heartburn, and gastroesophageal reflux disease (GERD).  Oral health problems, such as gum disease and tooth loss.  Loss of taste and smell. Smoking can affect your appearance by causing:  Wrinkles.  Yellow or stained teeth, fingers, and fingernails. Smoking tobacco can also affect your social life, because:  It may be challenging to find places to smoke when away from home. Many workplaces, restaurants, hotels, and public places are tobacco-free.  Smoking is expensive. This is due to the cost of tobacco and the long-term costs of treating health problems from smoking.  Secondhand smoke may affect those around you. Secondhand smoke can cause lung cancer, breathing problems, and heart disease. Children of smokers have a higher risk for: ? Sudden infant death syndrome (SIDS). ? Ear infections. ? Lung infections. If you currently smoke tobacco, quitting now can help you:  Lead a longer and healthier life.  Look, smell, breathe, and feel better over time.  Save money.  Protect others from the  harms of secondhand smoke. What actions can I take to prevent health problems? Quit smoking   Do not start smoking. Quit if you already do.  Make a plan to quit smoking and commit to it. Look for programs to help you and ask your health care provider for recommendations and ideas.  Set a date and write down all the reasons you want to quit.  Let your friends and family know you are quitting so they can help and support you. Consider finding friends who also want to quit. It can be easier to quit with someone else, so that you can support each other.  Talk with your health care provider about using nicotine replacement medicines to help you quit, such as gum, lozenges, patches, sprays, or pills.  Do not replace cigarette smoking with electronic cigarettes, which are commonly called e-cigarettes. The safety of e-cigarettes is not known, and some may contain harmful chemicals.  If you try to quit but return to smoking, stay positive. It is common to slip up when you first quit, so take it one day at a time.  Be prepared for cravings. When you feel the urge to smoke, chew gum or suck on hard candy. Lifestyle  Stay busy and take care of your body.  Drink enough fluid to keep your urine pale yellow.  Get plenty of exercise and eat a healthy diet. This can help prevent weight gain after quitting.  Monitor your eating habits. Quitting smoking can cause you to have a larger appetite than when you smoke.  Find ways to relax. Go out with friends or family to a movie or a restaurant   where people do not smoke. °· Ask your health care provider about having regular tests (screenings) to check for cancer. This may include blood tests, imaging tests, and other tests. °· Find ways to manage your stress, such as meditation, yoga, or exercise. °Where to find support °To get support to quit smoking, consider: °· Asking your health care provider for more information and resources. °· Taking classes to learn  more about quitting smoking. °· Looking for local organizations that offer resources about quitting smoking. °· Joining a support group for people who want to quit smoking in your local community. °· Calling the smokefree.gov counselor helpline: 1-800-Quit-Now (1-800-784-8669) °Where to find more information °You may find more information about quitting smoking from: °· HelpGuide.org: www.helpguide.org °· Smokefree.gov: smokefree.gov °· American Lung Association: www.lung.org °Contact a health care provider if you: °· Have problems breathing. °· Notice that your lips, nose, or fingers turn blue. °· Have chest pain. °· Are coughing up blood. °· Feel faint or you pass out. °· Have other health changes that cause you to worry. °Summary °· Smoking tobacco can negatively affect your health, the health of those around you, your finances, and your social life. °· Do not start smoking. Quit if you already do. If you need help quitting, ask your health care provider. °· Think about joining a support group for people who want to quit smoking in your local community. There are many effective programs that will help you to quit this behavior. °This information is not intended to replace advice given to you by your health care provider. Make sure you discuss any questions you have with your health care provider. °Document Revised: 07/14/2019 Document Reviewed: 11/03/2016 °Elsevier Patient Education © 2020 Else[employer] Inc[employer] °DASH Eating Plan °DASH stands for "Dietary Approaches to Stop Hypertension." The DASH eating plan is a healthy eating plan that has been shown to reduce high blood pressure (hypertension). It may also reduce your risk for type 2 diabetes, heart disease, and stroke. The DASH eating plan may also help with weight loss. °What are tips for following this plan? ° °General guidelines °· Avoid eating more than 2,300 mg (milligrams) of salt (sodium) a day. If you have hypertension, you may need to reduce your sodium  intake to 1,500 mg a day. °· Limit alcohol intake to no more than 1 drink a day for nonpregnant women and 2 drinks a day for men. One drink equals 12 oz of beer, 5 oz of wine, or 1½ oz of hard liquor. °· Work with your health care provider to maintain a healthy body weight or to lose weight. Ask what an ideal weight is for you. °· Get at least 30 minutes of exercise that causes your heart to beat faster (aerobic exercise) most days of the week. Activities may include walking, swimming, or biking. °· Work with your health care provider or diet and nutrition specialist (dietitian) to adjust your eating plan to your individual calorie needs. °Reading food labels ° °· Check food labels for the amount of sodium per serving. Choose foods with less than 5 percent of the Daily Value of sodium. Generally, foods with less than 300 mg of sodium per serving fit into this eating plan. °· To find whole grains, look for the word "whole" as the first word in the ingredient list. °Shopping °· Buy products labeled as "low-sodium" or "no salt added." °· Buy fresh foods. Avoid canned foods and premade or frozen meals. °Cooking °· Avoid adding salt when cooking. Use   salt-free seasonings or herbs instead of table salt or sea salt. Check with your health care provider or pharmacist before using salt substitutes. °· Do not fry foods. Cook foods using healthy methods such as baking, boiling, grilling, and broiling instead. °· Cook with heart-healthy oils, such as olive, canola, soybean, or sunflower oil. °Meal planning °· Eat a balanced diet that includes: °? 5 or more servings of fruits and vegetables each day. At each meal, try to fill half of your plate with fruits and vegetables. °? Up to 6-8 servings of whole grains each day. °? Less than 6 oz of lean meat, poultry, or fish each day. A 3-oz serving of meat is about the same size as a deck of cards. One egg equals 1 oz. °? 2 servings of low-fat dairy each day. °? A serving of nuts,  seeds, or beans 5 times each week. °? Heart-healthy fats. Healthy fats called Omega-3 fatty acids are found in foods such as flaxseeds and coldwater fish, like sardines, salmon, and mackerel. °· Limit how much you eat of the following: °? Canned or prepackaged foods. °? Food that is high in trans fat, such as fried foods. °? Food that is high in saturated fat, such as fatty meat. °? Sweets, desserts, sugary drinks, and other foods with added sugar. °? Full-fat dairy products. °· Do not salt foods before eating. °· Try to eat at least 2 vegetarian meals each week. °· Eat more home-cooked food and less restaurant, buffet, and fast food. °· When eating at a restaurant, ask that your food be prepared with less salt or no salt, if possible. °What foods are recommended? °The items listed may not be a complete list. Talk with your dietitian about what dietary choices are best for you. °Grains °Whole-grain or whole-wheat bread. Whole-grain or whole-wheat pasta. Brown rice. Oatmeal. Quinoa. Bulgur. Whole-grain and low-sodium cereals. Pita bread. Low-fat, low-sodium crackers. Whole-wheat flour tortillas. °Vegetables °Fresh or frozen vegetables (raw, steamed, roasted, or grilled). Low-sodium or reduced-sodium tomato and vegetable juice. Low-sodium or reduced-sodium tomato sauce and tomato paste. Low-sodium or reduced-sodium canned vegetables. °Fruits °All fresh, dried, or frozen fruit. Canned fruit in natural juice (without added sugar). °Meat and other protein foods °Skinless chicken or turkey. Ground chicken or turkey. Pork with fat trimmed off. Fish and seafood. Egg whites. Dried beans, peas, or lentils. Unsalted nuts, nut butters, and seeds. Unsalted canned beans. Lean cuts of beef with fat trimmed off. Low-sodium, lean deli meat. °Dairy °Low-fat (1%) or fat-free (skim) milk. Fat-free, low-fat, or reduced-fat cheeses. Nonfat, low-sodium ricotta or cottage cheese. Low-fat or nonfat yogurt. Low-fat, low-sodium cheese. °Fats  and oils °Soft margarine without trans fats. Vegetable oil. Low-fat, reduced-fat, or light mayonnaise and salad dressings (reduced-sodium). Canola, safflower, olive, soybean, and sunflower oils. Avocado. °Seasoning and other foods °Herbs. Spices. Seasoning mixes without salt. Unsalted popcorn and pretzels. Fat-free sweets. °What foods are not recommended? °The items listed may not be a complete list. Talk with your dietitian about what dietary choices are best for you. °Grains °Baked goods made with fat, such as croissants, muffins, or some breads. Dry pasta or rice meal packs. °Vegetables °Creamed or fried vegetables. Vegetables in a cheese sauce. Regular canned vegetables (not low-sodium or reduced-sodium). Regular canned tomato sauce and paste (not low-sodium or reduced-sodium). Regular tomato and vegetable juice (not low-sodium or reduced-sodium). Pickles. Olives. °Fruits °Canned fruit in a light or heavy syrup. Fried fruit. Fruit in cream or butter sauce. °Meat and other protein foods °Fatty cuts   of meat. Ribs. Fried meat. Bacon. Sausage. Bologna and other processed lunch meats. Salami. Fatback. Hotdogs. Bratwurst. Salted nuts and seeds. Canned beans with added salt. Canned or smoked fish. Whole eggs or egg yolks. Chicken or turkey with skin. °Dairy °Whole or 2% milk, cream, and half-and-half. Whole or full-fat cream cheese. Whole-fat or sweetened yogurt. Full-fat cheese. Nondairy creamers. Whipped toppings. Processed cheese and cheese spreads. °Fats and oils °Butter. Stick margarine. Lard. Shortening. Ghee. Bacon fat. Tropical oils, such as coconut, palm kernel, or palm oil. °Seasoning and other foods °Salted popcorn and pretzels. Onion salt, garlic salt, seasoned salt, table salt, and sea salt. Worcestershire sauce. Tartar sauce. Barbecue sauce. Teriyaki sauce. Soy sauce, including reduced-sodium. Steak sauce. Canned and packaged gravies. Fish sauce. Oyster sauce. Cocktail sauce. Horseradish that you find on  the shelf. Ketchup. Mustard. Meat flavorings and tenderizers. Bouillon cubes. Hot sauce and Tabasco sauce. Premade or packaged marinades. Premade or packaged taco seasonings. Relishes. Regular salad dressings. °Where to find more information: °· National Heart, Lung, and Blood Institute: www.nhlbi.nih.gov °· American Heart Association: www.heart.org °Summary °· The DASH eating plan is a healthy eating plan that has been shown to reduce high blood pressure (hypertension). It may also reduce your risk for type 2 diabetes, heart disease, and stroke. °· With the DASH eating plan, you should limit salt (sodium) intake to 2,300 mg a day. If you have hypertension, you may need to reduce your sodium intake to 1,500 mg a day. °· When on the DASH eating plan, aim to eat more fresh fruits and vegetables, whole grains, lean proteins, low-fat dairy, and heart-healthy fats. °· Work with your health care provider or diet and nutrition specialist (dietitian) to adjust your eating plan to your individual calorie needs. °This information is not intended to replace advice given to you by your health care provider. Make sure you discuss any questions you have with your health care provider. °Document Revised: 10/01/2017 Document Reviewed: 10/12/2016 °Elsevier Patient Education © 2020 Elsevier Inc. ° °

## 2019-11-14 NOTE — Progress Notes (Signed)
Established Patient Office Visit  Subjective:  Patient ID: Shannon Schroeder, female    DOB: 1956-08-03  Age: 64 y.o. MRN: [id number]  CC:  Chief Complaint  Patient presents with  . Hypertension  Patient consents to telephone visit and to patient identifiers was used to identify patient.  HPI Shannon Schroeder presents for follow up of hypertension.  She states that she is compliant with her medications and adherence to DASH diet.  She states that she checks her blood pressure couple of times during the week and it is usually less than 140/80.  She continues to smoke 1 pack of cigarette daily and denies the desire to quit.  She states that she has not heard from  [facility] program for her mammogram and Pap smear screening.  She denies chest pain, palpitation, lightheadedness, cough, peripheral edema, fever and chills.  Overall she states that she is doing well and offers no further complaint.  Past Medical History:  Diagnosis Date  . 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.   Marland Kitchen Anxiety   . Arthritis   . Bipolar disorder ([place])   . COPD (chronic obstructive pulmonary disease) ([place])   . Depression   . Dysrhythmia    H/O V TACH  . Hypertension     Past Surgical History:  Procedure Laterality Date  . ABDOMINAL HYSTERECTOMY     partial  . BREAST SURGERY     biopsy  . CARDIAC CATHETERIZATION     X 2  . CARPECTOMY HAND Right   . CATARACT EXTRACTION W/PHACO Left 03/08/2018   Procedure: CATARACT EXTRACTION PHACO AND INTRAOCULAR LENS PLACEMENT (IOC);  Surgeon: Birder Robson, MD;  Location: [facility] ORS;  Service: Ophthalmology;  Laterality: Left;  [place] 00:55 AP% 13.5 CDE 7.52 Fluid pack lot # [id number]  . CATARACT EXTRACTION W/PHACO Right 04/05/2018   Procedure: CATARACT EXTRACTION PHACO AND INTRAOCULAR LENS PLACEMENT (IOC);  Surgeon: Birder Robson, MD;  Location: [facility] ORS;  Service: Ophthalmology;  Laterality: Right;  [place] 00:36 AP% 15.9 CDE  5.72 Fluid pack lot # [id number]  . CHOLECYSTECTOMY    . COLONOSCOPY WITH PROPOFOL N/A 04/26/2019   Procedure: COLONOSCOPY WITH PROPOFOL;  Surgeon: Jonathon Bellows, MD;  Location: [facility] ENDOSCOPY;  Service: Gastroenterology;  Laterality: N/A;  . TUBAL LIGATION      Family History  Problem Relation Age of Onset  . Hypertension Mother   . Heart failure Mother   . Hypertension Brother   . Stroke Brother   . Hypertension Son   . Emphysema Maternal Aunt   . Hypertension Paternal Aunt   . Hypertension Paternal Uncle   . Hypertension Maternal Grandmother   . Hypertension Paternal Grandmother     Social History   Socioeconomic History  . Marital status: Divorced    Spouse name: Not on file  . Number of children: Not on file  . Years of education: 23  . Highest education level: Bachelor's degree (e.g., BA, AB, BS)  Occupational History  . Occupation: farmer  Tobacco Use  . Smoking status: Current Every Day Smoker    Packs/day: 1.00    Types: Cigarettes  . Smokeless tobacco: Never Used  Substance and Sexual Activity  . Alcohol use: No  . Drug use: No  . Sexual activity: Not Currently  Other Topics Concern  . Not on file  Social History Narrative   Pt lives at [facility].    Social Determinants of Health   Financial Resource Strain:   .  Difficulty of Paying Living Expenses: Not on file  Food Insecurity:   . Worried About Charity fundraiser in the Last Year: Not on file  . Ran Out of Food in the Last Year: Not on file  Transportation Needs:   . Lack of Transportation (Medical): Not on file  . Lack of Transportation (Non-Medical): Not on file  Physical Activity:   . Days of Exercise per Week: Not on file  . Minutes of Exercise per Session: Not on file  Stress:   . Feeling of Stress : Not on file  Social Connections:   . Frequency of Communication with Friends and Family: Not on file  . Frequency of Social Gatherings with Friends and Family: Not on file  . Attends  Religious Services: Not on file  . Active Member of Clubs or Organizations: Not on file  . Attends Archivist Meetings: Not on file  . Marital Status: Not on file  Intimate Partner Violence:   . Fear of Current or Ex-Partner: Not on file  . Emotionally Abused: Not on file  . Physically Abused: Not on file  . Sexually Abused: Not on file    Outpatient Medications Prior to Visit  Medication Sig Dispense Refill  . amLODipine (NORVASC) 5 MG tablet Take 1 tablet (5 mg total) by mouth daily. 90 tablet 3  . atenolol (TENORMIN) 25 MG tablet Take 1 tablet (25 mg total) by mouth 2 (two) times daily. 180 tablet 3  . lisinopril (ZESTRIL) 20 MG tablet Take 1 tablet (20 mg total) by mouth daily. 30 tablet 2  . VENTOLIN HFA 108 (90 Base) MCG/ACT inhaler INHALE 1 TO 2 PUFFS EVERY 6 HOURS AS NEEDED FOR WHEEZING OR SHORTNESSOF BREATH (Patient not taking: Reported on 09/12/2019) 18 g 0  . amoxicillin-clavulanate (AUGMENTIN) 875-125 MG tablet Take 1 tablet by mouth 2 (two) times daily. 6 tablet 0   No facility-administered medications prior to visit.    Allergies  Allergen Reactions  . Fluoxetine Other (See Comments)  . Aspirin Swelling and Other (See Comments)  . Belladonna Alkaloids Rash  . Fluoxetine Hcl Other (See Comments) and Rash  . Naproxen Swelling, Rash and Other (See Comments)  . Paroxetine Hcl Rash and Other (See Comments)  . Phenobarbital Rash and Other (See Comments)  . Prozac [Fluoxetine Hcl] Rash    ROS Review of Systems  Constitutional: Negative.   Eyes: Negative.   Respiratory: Negative.   Cardiovascular: Negative.   Skin: Negative.   Neurological: Negative.   Psychiatric/Behavioral: Negative.       Objective:    Physical Exam No Physical exam was done LMP 11/19/1988 (Exact Date)  Wt Readings from Last 3 Encounters:  09/12/19 166 lb (75.3 kg)  05/01/19 175 lb 12.8 oz (79.7 kg)  04/26/19 175 lb (79.4 kg)     Health Maintenance Due  Topic Date Due  .  Hepatitis C Screening  02/14/1956  . TETANUS/TDAP  05/03/1974  . PAP SMEAR-Modifier  05/03/1976  . MAMMOGRAM  07/17/2017    There are no preventive care reminders to display for this patient.  Lab Results  Component Value Date   TSH 3.050 03/24/2019   Lab Results  Component Value Date   WBC 8.5 03/15/2019   HGB 13.8 03/15/2019   HCT 39.0 03/15/2019   MCV 96 03/15/2019   PLT 325 03/15/2019   Lab Results  Component Value Date   NA 135 03/15/2019   K 4.2 03/15/2019   CO2 23 03/15/2019  GLUCOSE 87 03/15/2019   BUN 12 03/15/2019   CREATININE 0.65 03/15/2019   BILITOT <0.2 03/15/2019   ALKPHOS 127 (H) 03/15/2019   AST 10 03/15/2019   ALT 10 03/15/2019   PROT 6.8 03/15/2019   ALBUMIN 4.5 03/15/2019   CALCIUM 9.5 03/15/2019   ANIONGAP 8 06/15/2018   Lab Results  Component Value Date   CHOL 180 12/21/2018   Lab Results  Component Value Date   HDL 71 12/21/2018   Lab Results  Component Value Date   LDLCALC 92 12/21/2018   Lab Results  Component Value Date   TRIG 84 12/21/2018   Lab Results  Component Value Date   CHOLHDL 2.5 12/21/2018   Lab Results  Component Value Date   HGBA1C 4.9 12/18/2016      Assessment & Plan:    1. Hypertension, unspecified type -Her blood pressure is under control per patient, and she will continue on current treatment regimen. -She was advised to continue on low salt DASH diet -Take medications regularly on time -Exercise regularly as tolerated -Check blood pressure at least once a week at home or a nearby pharmacy and record -Goal is less than 140/90 and normal blood pressure is 120/80 - amLODipine (NORVASC) 5 MG tablet; Take 1 tablet (5 mg total) by mouth daily.  Dispense: 90 tablet; Refill: 1 - lisinopril (ZESTRIL) 20 MG tablet; Take 1 tablet (20 mg total) by mouth daily.  Dispense: 90 tablet; Refill: 1 - atenolol (TENORMIN) 25 MG tablet; Take 1 tablet (25 mg total) by mouth 2 (two) times daily.  Dispense: 180 tablet;  Refill: 3  2. Tobacco abuse -She was strongly encouraged on smoking cessation.  3. Health care maintenance -The following routine labs will be collected, and she was mailed [facility] program flyer for her to call and schedule mammogram and Pap smear. - CBC w/Diff; Future - Lipid panel; Future - Comp Met (CMET); Future    Follow-up: Return in about 6 months (around 05/01/2020), or if symptoms worsen or fail to improve.    Karsen Fellows Jerold Coombe, NP

## 2019-11-20 ENCOUNTER — Telehealth: Payer: Self-pay | Admitting: Pharmacist

## 2019-11-20 NOTE — Telephone Encounter (Signed)
11/20/2019 3:58:01 PM - [facility] closed pat. did not return paperwork  11/20/2019 I have closed [facility]--I mailed renewal application to patient to sign & return 09/06/2019-She has not returned. The provider prev. signed 09/07/2019.Delos Haring

## 2020-02-05 ENCOUNTER — Ambulatory Visit: Payer: Medicaid Other

## 2020-02-05 DIAGNOSIS — Z79899 Other long term (current) drug therapy: Secondary | ICD-10-CM

## 2020-02-05 NOTE — Progress Notes (Signed)
Medication Management Clinic Visit Note  Patient: Shannon Schroeder MRN: [id number] Date of Birth: 02-05-1956 PCP: Shannon Millers, MD   Shannon Schroeder 64 y.o. female presents for a telephone medication therapy management visit with the pharmacist today. Patient identified using two patient identifiers.   LMP 11/19/1988 (Exact Date)   Patient Information   Past Medical History:  Diagnosis Date  . 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   Marland Kitchen Anxiety   . Arthritis   . Bipolar disorder ([place])   . COPD (chronic obstructive pulmonary disease) ([place])   . Depression   . Dysrhythmia    H/O V TACH  . Hypertension       Past Surgical History:  Procedure Laterality Date  . ABDOMINAL HYSTERECTOMY     partial  . BREAST SURGERY     biopsy  . CARDIAC CATHETERIZATION     X 2  . CARPECTOMY HAND Right   . CATARACT EXTRACTION W/PHACO Left 03/08/2018   Procedure: CATARACT EXTRACTION PHACO AND INTRAOCULAR LENS PLACEMENT (IOC);  Surgeon: Birder Robson, MD;  Location: [facility] ORS;  Service: Ophthalmology;  Laterality: Left;  [place] 00:55 AP% 13.5 CDE 7.52 Fluid pack lot # [id number]  . CATARACT EXTRACTION W/PHACO Right 04/05/2018   Procedure: CATARACT EXTRACTION PHACO AND INTRAOCULAR LENS PLACEMENT (IOC);  Surgeon: Birder Robson, MD;  Location: [facility] ORS;  Service: Ophthalmology;  Laterality: Right;  [place] 00:36 AP% 15.9 CDE 5.72 Fluid pack lot # [id number]  . CHOLECYSTECTOMY    . COLONOSCOPY WITH PROPOFOL N/A 04/26/2019   Procedure: COLONOSCOPY WITH PROPOFOL;  Surgeon: Jonathon Bellows, MD;  Location: [facility] ENDOSCOPY;  Service: Gastroenterology;  Laterality: N/A;  . TUBAL LIGATION       Family History  Problem Relation Age of Onset  . Hypertension Mother   . Heart failure Mother   . Hypertension Brother   . Stroke Brother   . Hypertension Son   . Emphysema Maternal Aunt   . Hypertension Paternal Aunt   . Hypertension Paternal Uncle   . Hypertension  Maternal Grandmother   . Hypertension Paternal Grandmother     New Diagnoses (since last visit): None  Lifestyle Diet: Patient endorses following the DASH diet. Does not salt foods or use salt in cooking.     Social History   Substance and Sexual Activity  Alcohol Use No   Denies alcohol use and illicit substances.    Social History   Tobacco Use  Smoking Status Current Every Day Smoker  . Packs/day: 1.00  . Types: Cigarettes  Smokeless Tobacco Never Used   Currently smoking 1 PPD. Not interested in quitting at this time.    Health Maintenance  Topic Date Due  . Hepatitis C Screening  Never done  . TETANUS/TDAP  Never done  . PAP SMEAR-Modifier  Never done  . MAMMOGRAM  07/17/2017  . INFLUENZA VACCINE  06/02/2020  . COLONOSCOPY  04/25/2026  . HIV Screening  Completed    Outpatient Encounter Medications as of 02/05/2020  Medication Sig  . acetaminophen (TYLENOL) 500 MG tablet Take 500-1,000 mg by mouth every 6 (six) hours as needed for moderate pain.  Marland Kitchen amLODipine (NORVASC) 5 MG tablet Take 1 tablet (5 mg total) by mouth daily.  Marland Kitchen atenolol (TENORMIN) 25 MG tablet Take 1 tablet (25 mg total) by mouth 2 (two) times daily.  Marland Kitchen ibuprofen (ADVIL) 400 MG tablet Take 400 mg by mouth every 6 (six) hours as needed for mild pain or moderate  pain.  . lisinopril (ZESTRIL) 20 MG tablet Take 1 tablet (20 mg total) by mouth daily.  . Melatonin 10 MG TABS Take 10 mg by mouth at bedtime as needed.  . VENTOLIN HFA 108 (90 Base) MCG/ACT inhaler INHALE 1 TO 2 PUFFS EVERY 6 HOURS AS NEEDED FOR WHEEZING OR SHORTNESSOF BREATH (Patient not taking: Reported on 09/12/2019)   No facility-administered encounter medications on file as of 02/05/2020.   Health Maintenance/Date Completed  Last ED visit: No recent ED visits per chart Last Visit to PCP: 11/14/2019 [facility]) Next Visit to PCP: 05/01/2020 [facility]) Specialist Visit: Patient denies seeing any specialists Dental Exam: Denies  Eye Exam: Wears  reading glasses. History of cataracts surgery Pelvic/PAP Exam: Due. Denies family history of cervical cancer Mammogram: Due. Denies family history of breast cancer DEXA: Never. Mother's sister has history of hip replacement Colonoscopy: 04/26/2019 Flu Vaccine: Endorses receipt Pneumonia Vaccine: Last administration was 05/22/2015. Denies history of pneumonia COVID-19 Vaccine: Completed Pfizer series Shingrix Vaccine: Due  Assessment and Plan:  1. Hypertension -Antihypertensive regimen includes atenolol 25 mg BID, amlodipine 5 mg daily, and lisinopril 20 mg daily -Denies cough with lisinopril -Denies edema with amlodipine -Denies signs/symptoms of hypotension (dizziness / lightheadedness) -Denies signs/symptoms of hypertension (headache / facial flushing) -Does not check blood pressure at home. Advised patient to inquire about blood pressure monitoring device -Following DASH diet  2. Tobacco abuse -Patient smokes 1 PPD  -Not interested in quitting at this time -Breathing is stable and not requiring use of albuterol inhaler  3. Joint pain -APAP or ibuprofen PRN  4. Adherence -Patient endorses taking medications daily as prescribed -Not requiring refills on medications at this time  RTC in 1 year for MTM  [place] Resident 05 February 2020

## 2020-02-06 ENCOUNTER — Other Ambulatory Visit: Payer: Self-pay

## 2020-04-02 ENCOUNTER — Ambulatory Visit: Payer: Medicaid Other

## 2020-04-24 ENCOUNTER — Other Ambulatory Visit: Payer: Medicaid Other

## 2020-05-01 ENCOUNTER — Ambulatory Visit: Payer: Medicaid Other | Admitting: Gerontology

## 2020-07-10 ENCOUNTER — Telehealth: Payer: Self-pay | Admitting: Emergency Medicine

## 2020-07-10 ENCOUNTER — Encounter: Payer: Self-pay | Admitting: Intensive Care

## 2020-07-10 ENCOUNTER — Other Ambulatory Visit: Payer: Self-pay

## 2020-07-10 ENCOUNTER — Emergency Department: Payer: Medicaid Other

## 2020-07-10 ENCOUNTER — Emergency Department
Admission: EM | Admit: 2020-07-10 | Discharge: 2020-07-10 | Disposition: A | Discharge status: Home / Self Care | Payer: Medicaid Other | Attending: Emergency Medicine | Admitting: Emergency Medicine

## 2020-07-10 DIAGNOSIS — C310 Malignant neoplasm of maxillary sinus: Secondary | ICD-10-CM | POA: Insufficient documentation

## 2020-07-10 DIAGNOSIS — Z8673 Personal history of transient ischemic attack (TIA), and cerebral infarction without residual deficits: Secondary | ICD-10-CM | POA: Insufficient documentation

## 2020-07-10 DIAGNOSIS — F17210 Nicotine dependence, cigarettes, uncomplicated: Secondary | ICD-10-CM | POA: Insufficient documentation

## 2020-07-10 DIAGNOSIS — J449 Chronic obstructive pulmonary disease, unspecified: Secondary | ICD-10-CM | POA: Insufficient documentation

## 2020-07-10 DIAGNOSIS — Z79899 Other long term (current) drug therapy: Secondary | ICD-10-CM | POA: Insufficient documentation

## 2020-07-10 DIAGNOSIS — I10 Essential (primary) hypertension: Secondary | ICD-10-CM | POA: Insufficient documentation

## 2020-07-10 LAB — COMPREHENSIVE METABOLIC PANEL
ALT: 16 U/L (ref 0–44)
AST: 20 U/L (ref 15–41)
Albumin: 3.7 g/dL (ref 3.5–5.0)
Alkaline Phosphatase: 106 U/L (ref 38–126)
Anion gap: 11 (ref 5–15)
BUN: 13 mg/dL (ref 8–23)
CO2: 28 mmol/L (ref 22–32)
Calcium: 8.9 mg/dL (ref 8.9–10.3)
Chloride: 97 mmol/L — ABNORMAL LOW (ref 98–111)
Creatinine, Ser: 0.43 mg/dL — ABNORMAL LOW (ref 0.44–1.00)
GFR calc Af Amer: >60 mL/min (ref >60)
GFR calc non Af Amer: >60 mL/min (ref >60)
Glucose, Bld: 100 mg/dL — ABNORMAL HIGH (ref 70–99)
Potassium: 2.9 mmol/L — ABNORMAL LOW (ref 3.5–5.1)
Sodium: 136 mmol/L (ref 135–145)
Total Bilirubin: 0.5 mg/dL (ref 0.3–1.2)
Total Protein: 7 g/dL (ref 6.5–8.1)

## 2020-07-10 LAB — CBC WITH DIFFERENTIAL/PLATELET
Abs Immature Granulocytes: 0.05 10*3/uL (ref 0.00–0.07)
Basophils Absolute: 0.1 10*3/uL (ref 0.0–0.1)
Basophils Relative: 1 %
Eosinophils Absolute: 0.2 10*3/uL (ref 0.0–0.5)
Eosinophils Relative: 1 %
HCT: 35.9 % — ABNORMAL LOW (ref 36.0–46.0)
Hemoglobin: 12 g/dL (ref 12.0–15.0)
Immature Granulocytes: 0 %
Lymphocytes Relative: 13 %
Lymphs Abs: 1.9 10*3/uL (ref 0.7–4.0)
MCH: 33.6 pg (ref 26.0–34.0)
MCHC: 33.4 g/dL (ref 30.0–36.0)
MCV: 100.6 fL — ABNORMAL HIGH (ref 80.0–100.0)
Monocytes Absolute: 0.9 10*3/uL (ref 0.1–1.0)
Monocytes Relative: 7 %
Neutro Abs: 11 10*3/uL — ABNORMAL HIGH (ref 1.7–7.7)
Neutrophils Relative %: 78 %
Platelets: 383 10*3/uL (ref 150–400)
RBC: 3.57 MIL/uL — ABNORMAL LOW (ref 3.87–5.11)
RDW: 13.5 % (ref 11.5–15.5)
WBC: 14.1 10*3/uL — ABNORMAL HIGH (ref 4.0–10.5)
nRBC: 0 % (ref 0.0–0.2)

## 2020-07-10 LAB — LACTIC ACID, PLASMA: Lactic Acid, Venous: 1 mmol/L (ref 0.5–1.9)

## 2020-07-10 LAB — MAGNESIUM: Magnesium: 2 mg/dL (ref 1.7–2.4)

## 2020-07-10 IMAGING — CT CT MAXILLOFACIAL W/ CM
3 series · 14 of 47 positions shown, 16 images · IV contrast (omnipaque)
Comparison: CT head and CTA head and neck [DATE].

CLINICAL DATA: 64-year-old female with right side facial swelling
and pain. Discolored areas of oral mucosa on that side.

EXAM:
CT MAXILLOFACIAL WITH CONTRAST
TECHNIQUE: Multidetector CT imaging of the maxillofacial structures was
performed with intravenous contrast. Multiplanar CT image
reconstructions were also generated.
CONTRAST:  75mL OMNIPAQUE IOHEXOL 300 MG/ML  SOLN

[Series 2: max soft · axial · 0.31mm/px · z∈[-197,-47]mm · 8 of 87 slices shown, 10 images]
[im 6/87  brain]
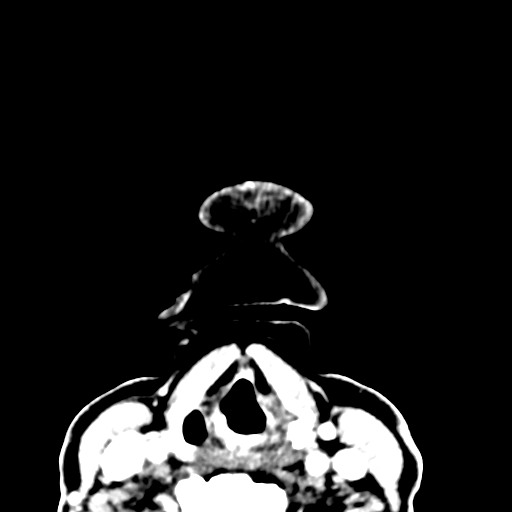
[im 6/87  bone]
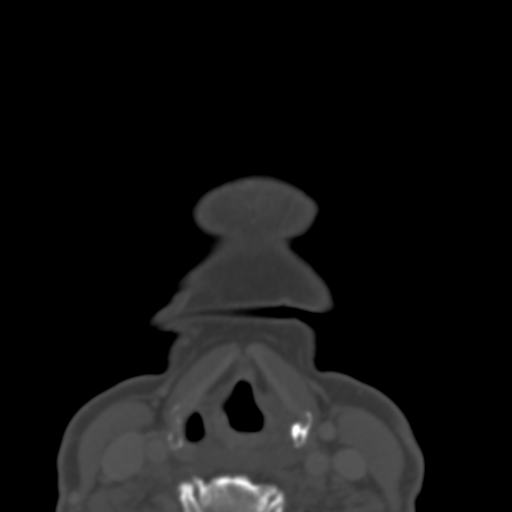
[im 18/87  bone]
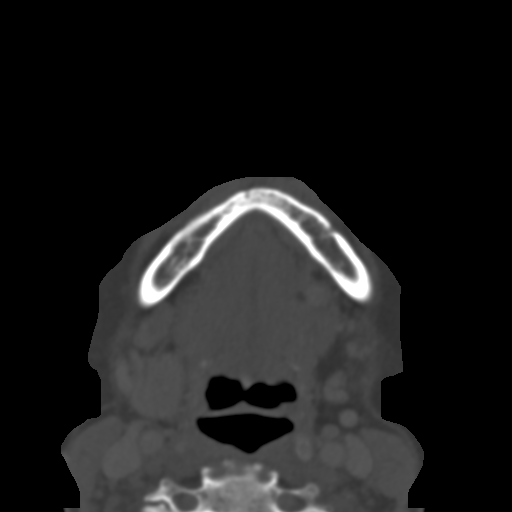
[im 27/87  bone]
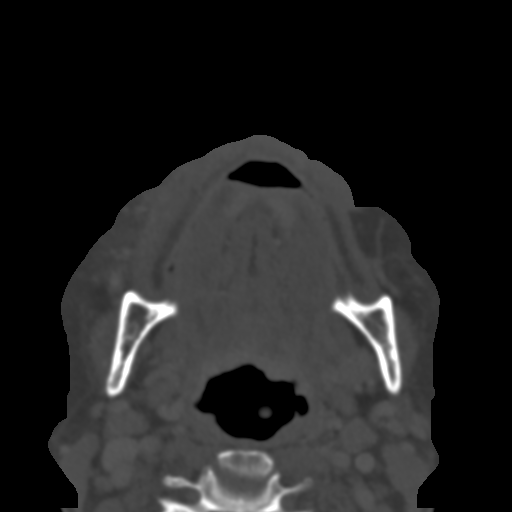
[im 39/87  bone]
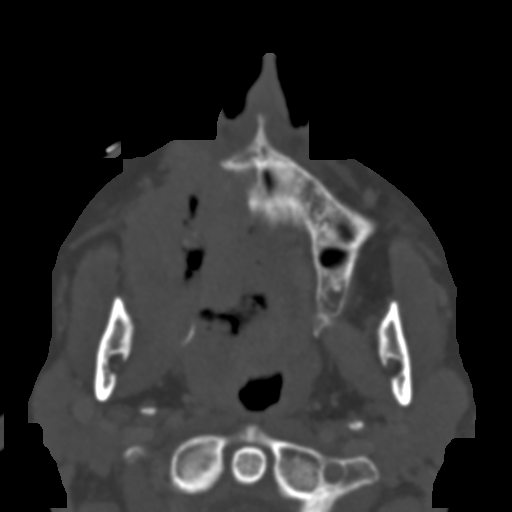
[im 48/87  brain]
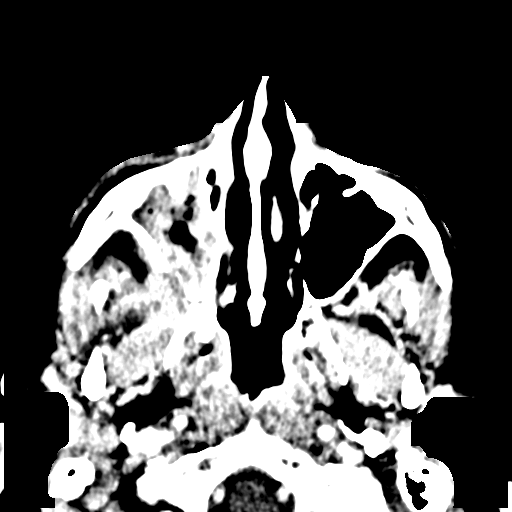
[im 48/87  bone]
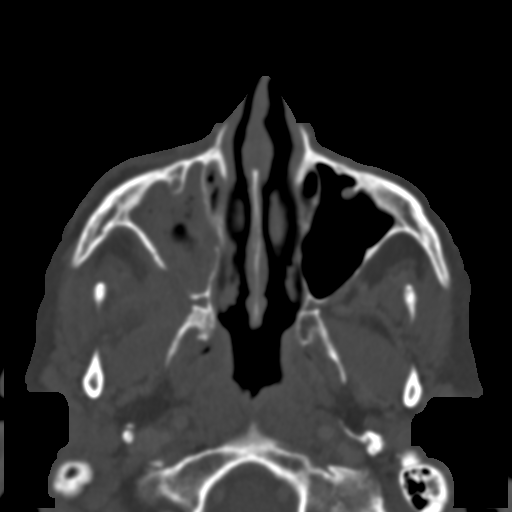
[im 60/87  bone]
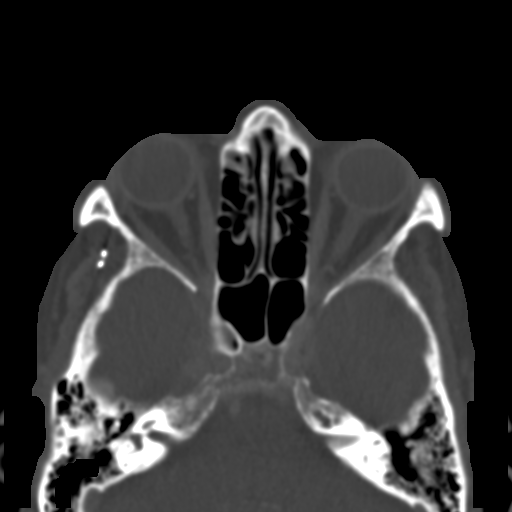
[im 69/87  bone]
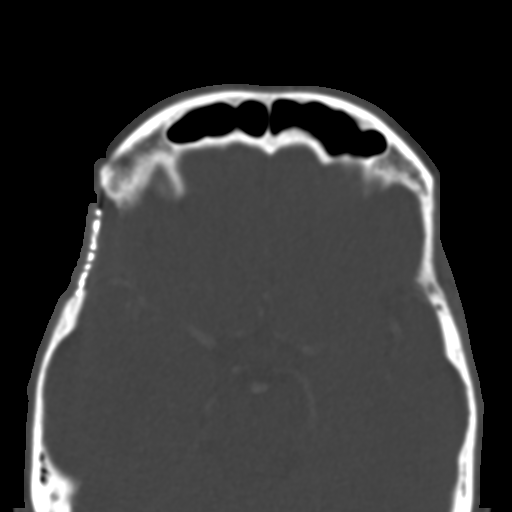
[im 81/87  bone]
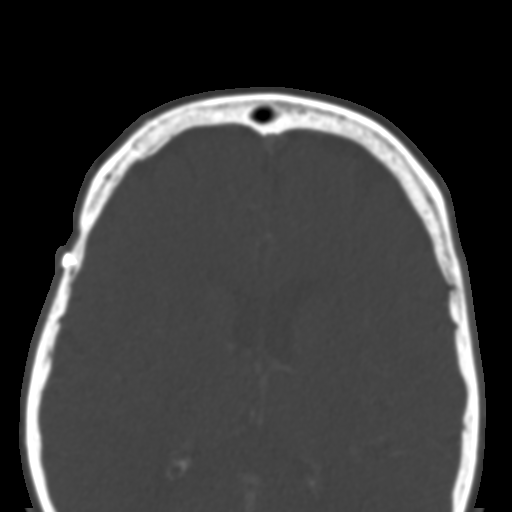

[Series 6: coronal soft · coronal · 0.32mm/px · 3 of 71 slices shown]
[im 24/71  bone]
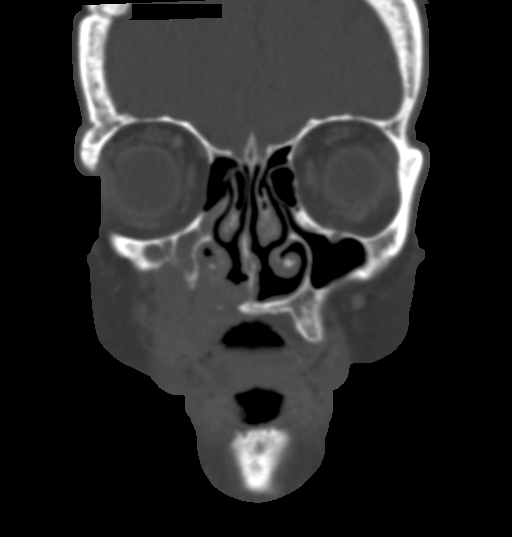
[im 32/71  bone]
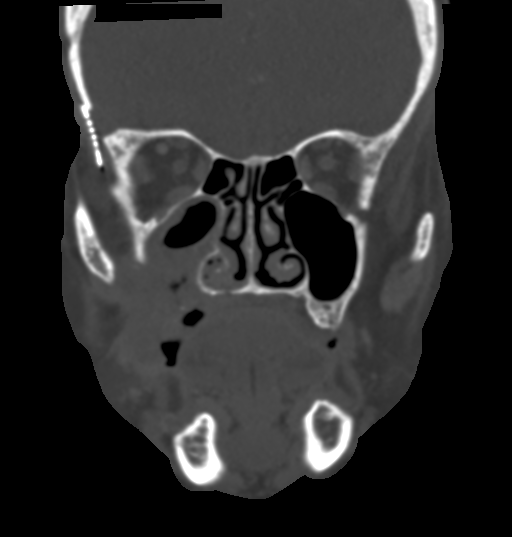
[im 39/71  bone]
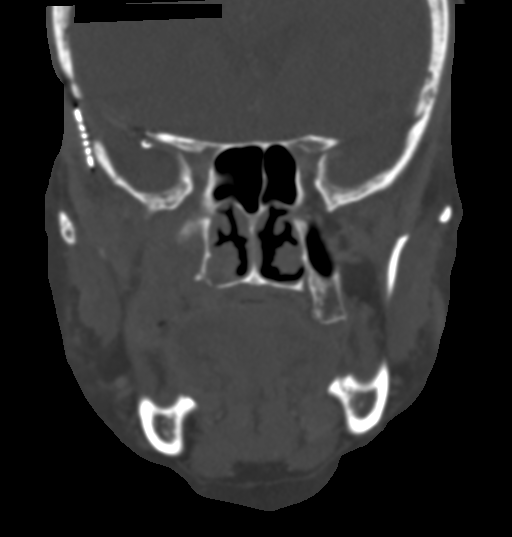

[Series 8: sagittal soft · sagittal · 0.28mm/px · 3 of 82 slices shown]
[im 28/82  bone]
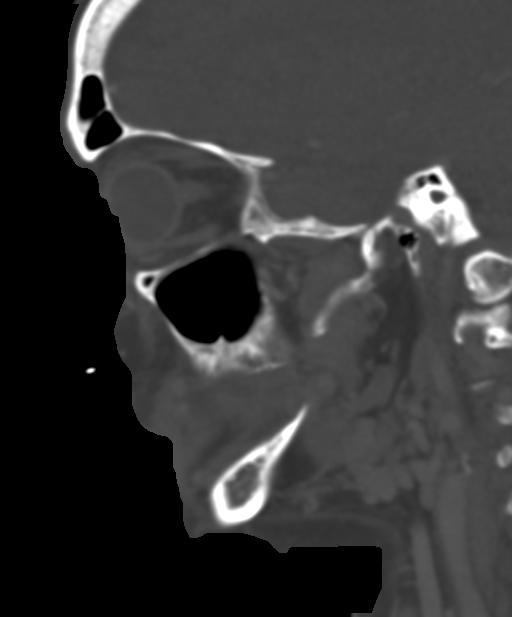
[im 41/82  bone]
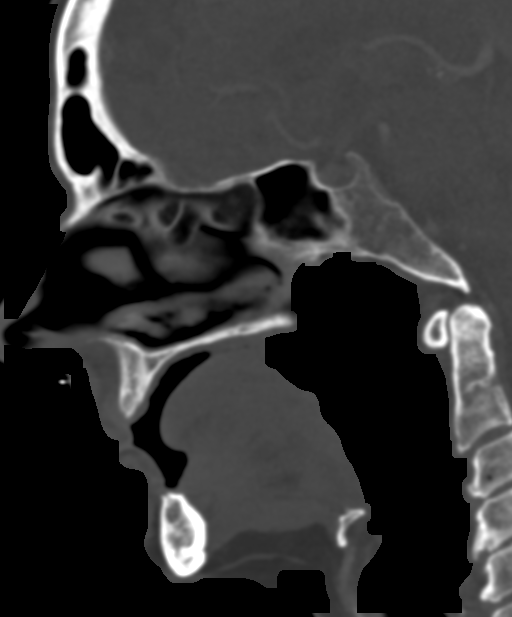
[im 55/82  bone]
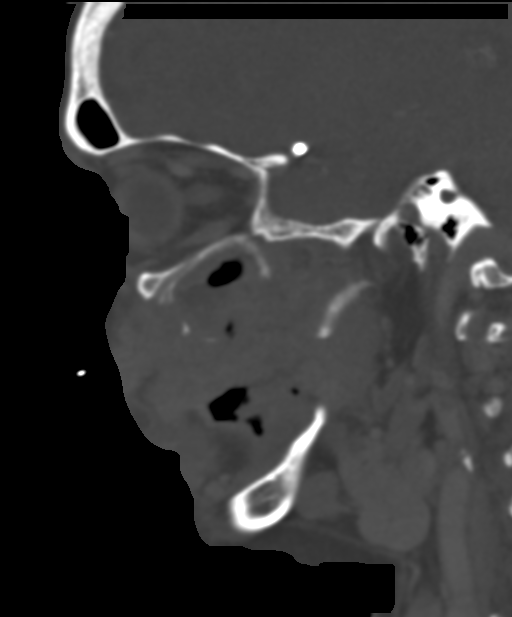

[14 of 47 positions shown; findings below may reference images not displayed]

FINDINGS: Osseous: New since [7C], erosion throughout the floor of the right
maxillary sinus, the right half of the hard palate (series 7, image
26) and the entire right maxillary alveolar process. The right
maxillary sinus was clear in [7C], but now demonstrates a
combination of low-density and enhancing soft tissue, presumably
tumor, which infiltrates through the eroded floor of the right
maxilla and to the right buccal space and oral cavity (series 6,
image 31). The soft tissue abnormality extends throughout much of
the floor of the right nasal cavity. The right pterygoid plates are
also partially eroded.

The abnormal enhancing soft tissue has indistinct margins but
encompasses an area of roughly 5 cm (series 2, image 47, series 6,
image 31). The right pterygoid palatine fossa is infiltrated (series
2, image 35).

The posterior margin of tumor is in proximity to the right mandible
on series 2, image 52, but the mandible and TMJ appear spared. The
clivus and remaining central skull base also appear intact. Right
frontotemporal craniotomy changes are new since [7C]. No other bone
erosion or suspicious bone lesion identified.

Orbits: The right orbital floor remains intact but is abutted by
abnormal soft tissue in the right maxillary sinus. There may be
tumor at the right orbital apex tracking cephalad from the pterygoid
palatine fossa, but otherwise no mass or inflammation within the
right orbit.

The other orbital walls are intact. The left orbit also appears
negative.

Sinuses: Destruction of the right maxilla with abnormal enhancing
and low-density masslike soft tissue within the sinus in involving
the floor of the right nasal cavity as stated above.

Elsewhere the nasal cavity and other paranasal sinuses seem to
remain normal. Mild retained secretions and/or mucosal thickening
suspected in the right sphenoid sinus. The tympanic cavities and
mastoids are clear.

Soft tissues: Bulky masslike soft tissue centered at the right
maxillary alveolus and involving the right buccal space and oral
cavity (series 2, image 55). Involvement of the anterior right
pterygoid musculature. The remaining right masticator space appears
spared.

In addition to cephalad spread to the right pterygoid palatine fossa
there appears to be contiguous tumor along the soft palate and right
tonsillar pillar on series 2, image 48. The remaining pharyngeal
contours are within normal limits.

The right parapharyngeal and retropharyngeal spaces remain within
normal limits. Central sublingual space, submandibular glands and
parotid glands are within normal limits.

Small but asymmetric 7 mm right level 2A lymph node on series 2,
image 70. On image 69 in the left lateral sublingual space there is
an indeterminate 7 mm enhancing soft tissue nodule. This seems too
posterior for the left sublingual gland. Bilateral level 2 lymph
nodes measure 7-9 mm short axis and appears symmetric. The visible
major vascular structures in the upper neck appear patent.

Limited intracranial: The right cavernous sinus seems to remain
patent. However, there is a subtle asymmetric enhancing soft tissue
nodule measuring 4-5 mm at the right foramen rotundum and abutting
the cavernous sinus on series 6, image 46. Definite intracranial
extension of tumor. Sequelae of right MCA aneurysm clipping.
IMPRESSION: 1. Bulky and indistinct enhancing tumor which has aggressively
destroyed the right maxilla, infiltrates along the right buccal
space, floor of the right nasal cavity, into the right pterygoid
palatine fossa, the right orbital apex, and also the right soft
palate and palatine tonsil.
Furthermore, there is evidence of intracranial extension along the
right V2 at the foramen rotundum.

2. Small but suspicious right level 1A lymph node measuring 7 mm.
And there is a separate small indeterminate but suspicious enhancing
soft tissue nodule of the left sublingual space.

## 2020-07-10 MED ORDER — ONDANSETRON HCL 4 MG/2ML IJ SOLN
4.0000 mg | Freq: Once | INTRAMUSCULAR | Status: AC
  Administered 2020-07-10: 4 mg via INTRAVENOUS
  Filled 2020-07-10: qty 2

## 2020-07-10 MED ORDER — MORPHINE SULFATE (PF) 4 MG/ML IV SOLN
4.0000 mg | Freq: Once | INTRAVENOUS | Status: AC
  Administered 2020-07-10: 4 mg via INTRAVENOUS
  Filled 2020-07-10: qty 1

## 2020-07-10 MED ORDER — POTASSIUM CHLORIDE 20 MEQ PO PACK
40.0000 meq | PACK | Freq: Two times a day (BID) | ORAL | Status: DC
  Administered 2020-07-10: 40 meq via ORAL

## 2020-07-10 MED ORDER — OXYCODONE-ACETAMINOPHEN 5-325 MG PO TABS: 1.0000 | ORAL_TABLET | ORAL | 0 refills | Status: DC | PRN

## 2020-07-10 MED ORDER — IOHEXOL 300 MG/ML  SOLN
75.0000 mL | Freq: Once | INTRAMUSCULAR | Status: AC | PRN
  Administered 2020-07-10: 75 mL via INTRAVENOUS
  Filled 2020-07-10: qty 75

## 2020-07-10 MED ORDER — OXYCODONE-ACETAMINOPHEN 5-325 MG PO TABS
1.0000 | ORAL_TABLET | ORAL | 0 refills | Status: DC | PRN
Start: 2020-07-10 — End: 2020-07-12

## 2020-07-10 NOTE — Discharge Instructions (Signed)
Follow-up at [facility] with Dr. Frazier Butt.  Please call him for an appointment.  Tell him that you were seen in the emergency department at [facility].  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 Take the oxycodone for pain as needed Return if worsening

## 2020-07-10 NOTE — Telephone Encounter (Cosign Needed)
Needed to call in new prescription to pharmacy. Pharmacy says tech accidentally printed and deleted prescription.

## 2020-07-10 NOTE — ED Triage Notes (Signed)
Patient presents with right sided facial swelling and pain in right side of mouth. Multiple black areas noted in mouth on right side. Patient only has dentures. Reports it has been hard to eat due to pain in mouth and that this has been going on for 1-2 months. No SOB. Also c/o taste being off.

## 2020-07-10 NOTE — ED Provider Notes (Signed)
[facility] Emergency Department Provider Note  ____________________________________________   First MD Initiated Contact with Patient 07/10/20 1444     (approximate)  I have reviewed the triage vital signs and the nursing notes.   HISTORY  Chief Complaint Facial Swelling    HPI Shannon Schroeder is a 64 y.o. female presents emergency department complaining of right-sided facial swelling and pain in the right side of her mouth.  Patient states that she has dentures and they did pull all of her teeth prior to having dentures.  Symptoms have been going on for approximately 6 weeks.  States it is affecting the way she eats and swallows at this time.  No fever or chills.  Has a history of low sodium and low potassium.    Past Medical History:  Diagnosis Date  . 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   Marland Kitchen Anxiety   . Arthritis   . Bipolar disorder ([place])   . COPD (chronic obstructive pulmonary disease) ([place])   . Depression   . Dysrhythmia    H/O V TACH  . Hypertension     Patient Active Problem List   Diagnosis Date Noted  . Abscess of upper gum 09/12/2019  . History of ventricular tachycardia 03/22/2019  . Bipolar affective disorder, current episode mixed ([place]) 06/29/2018  . TIA (transient ischemic attack) 06/15/2018  . Tobacco abuse 06/02/2018  . COPD (chronic obstructive pulmonary disease) ([place]) 04/21/2018  . URI (upper respiratory infection) 03/31/2018  . Hypertension 03/31/2018  . Tachycardia 02/02/2018  . Rash of body 02/02/2018  . Hyponatremia 02/02/2018    Past Surgical History:  Procedure Laterality Date  . ABDOMINAL HYSTERECTOMY     partial  . BREAST SURGERY     biopsy  . CARDIAC CATHETERIZATION     X 2  . CARPECTOMY HAND Right   . CATARACT EXTRACTION W/PHACO Left 03/08/2018   Procedure: CATARACT EXTRACTION PHACO AND INTRAOCULAR LENS PLACEMENT (IOC);  Surgeon: Birder Robson, MD;   Location: [facility] ORS;  Service: Ophthalmology;  Laterality: Left;  [place] 00:55 AP% 13.5 CDE 7.52 Fluid pack lot # [id number]  . CATARACT EXTRACTION W/PHACO Right 04/05/2018   Procedure: CATARACT EXTRACTION PHACO AND INTRAOCULAR LENS PLACEMENT (IOC);  Surgeon: Birder Robson, MD;  Location: [facility] ORS;  Service: Ophthalmology;  Laterality: Right;  [place] 00:36 AP% 15.9 CDE 5.72 Fluid pack lot # [id number]  . CHOLECYSTECTOMY    . COLONOSCOPY WITH PROPOFOL N/A 04/26/2019   Procedure: COLONOSCOPY WITH PROPOFOL;  Surgeon: Jonathon Bellows, MD;  Location: [facility] ENDOSCOPY;  Service: Gastroenterology;  Laterality: N/A;  . TUBAL LIGATION      Prior to Admission medications   Medication Sig Start Date End Date Taking? Authorizing Provider  acetaminophen (TYLENOL) 500 MG tablet Take 500-1,000 mg by mouth every 6 (six) hours as needed for moderate pain.    [provider]  amLODipine (NORVASC) 5 MG tablet Take 1 tablet (5 mg total) by mouth daily. 11/14/19   Iloabachie, Chioma E, NP  atenolol (TENORMIN) 25 MG tablet Take 1 tablet (25 mg total) by mouth 2 (two) times daily. 11/14/19   Iloabachie, Chioma E, NP  ibuprofen (ADVIL) 400 MG tablet Take 400 mg by mouth every 6 (six) hours as needed for mild pain or moderate pain.    [provider]  lisinopril (ZESTRIL) 20 MG tablet Take 1 tablet (20 mg total) by mouth daily. 11/14/19   Iloabachie, Chioma E, NP  Melatonin 10 MG  TABS Take 10 mg by mouth at bedtime as needed.    [provider]  oxyCODONE-acetaminophen (PERCOCET) 5-325 MG tablet Take 1 tablet by mouth every 4 (four) hours as needed for severe pain. 07/10/20 07/10/21  Lillyan Hitson, Linden Dolin, PA-C  VENTOLIN HFA 108 (90 Base) MCG/ACT inhaler INHALE 1 TO 2 PUFFS EVERY 6 HOURS AS NEEDED FOR WHEEZING OR SHORTNESSOF BREATH Patient not taking: Reported on 09/12/2019 07/18/19   Zara Council A, PA-C    Allergies Fluoxetine, Aspirin, Belladonna alkaloids, Fluoxetine hcl, Naproxen, Paroxetine hcl,  Phenobarbital, and Prozac [fluoxetine hcl]  Family History  Problem Relation Age of Onset  . Hypertension Mother   . Heart failure Mother   . Hypertension Brother   . Stroke Brother   . Hypertension Son   . Emphysema Maternal Aunt   . Hypertension Paternal Aunt   . Hypertension Paternal Uncle   . Hypertension Maternal Grandmother   . Hypertension Paternal Grandmother     Social History Social History   Tobacco Use  . Smoking status: Current Every Day Smoker    Packs/day: 1.00    Types: Cigarettes  . Smokeless tobacco: Never Used  Vaping Use  . Vaping Use: Never used  Substance Use Topics  . Alcohol use: No  . Drug use: No    Review of Systems  Constitutional: No fever/chills Eyes: No visual changes. ENT: No sore throat.  Positive for pain in the upper mouth and maxillofacial area on the right side Respiratory: Denies cough Cardiovascular: Denies chest pain Gastrointestinal: Denies abdominal pain Genitourinary: Negative for dysuria. Musculoskeletal: Negative for back pain. Skin: Negative for rash. Psychiatric: no mood changes,     ____________________________________________   PHYSICAL EXAM:  VITAL SIGNS: ED Triage Vitals  Enc Vitals Group     BP 07/10/20 1312 (!) 194/92     Pulse Rate 07/10/20 1312 66     Resp 07/10/20 1312 16     Temp 07/10/20 1312 98.5 F (36.9 C)     Temp Source 07/10/20 1312 Oral     SpO2 07/10/20 1312 99 %     Weight 07/10/20 1313 139 lb 8 oz (63.3 kg)     Height 07/10/20 1313 5\' 6"  (1.676 m)     Head Circumference --      Peak Flow --      Pain Score 07/10/20 1313 10     Pain Loc --      Pain Edu? --      Excl. in [place]? --     Constitutional: Alert and oriented. Well appearing and in no acute distress. Eyes: Conjunctivae are normal.  Head: Atraumatic.  Some swelling noted over the right maxillary area Nose: No congestion/rhinnorhea. Mouth/Throat: Mucous membranes are moist.  Severe excoriation noted in the right upper  gums with a yellowish discharge noted that is draining to the back of the throat, Neck:  supple no lymphadenopathy noted Cardiovascular: Normal rate, regular rhythm. Heart sounds are normal Respiratory: Normal respiratory effort.  No retractions, lungs c t a  GU: deferred Musculoskeletal: FROM all extremities, warm and well perfused Neurologic:  Normal speech and language.  Skin:  Skin is warm, dry and intact. No rash noted. Psychiatric: Mood and affect are normal. Speech and behavior are normal.  ____________________________________________   LABS (all labs ordered are listed, but only abnormal results are displayed)  Labs Reviewed  COMPREHENSIVE METABOLIC PANEL - Abnormal; Notable for the following components:      Result Value   Potassium 2.9 (*)  Chloride 97 (*)    Glucose, Bld 100 (*)    Creatinine, Ser 0.43 (*)    All other components within normal limits  CBC WITH DIFFERENTIAL/PLATELET - Abnormal; Notable for the following components:   WBC 14.1 (*)    RBC 3.57 (*)    HCT 35.9 (*)    MCV 100.6 (*)    Neutro Abs 11.0 (*)    All other components within normal limits  LACTIC ACID, PLASMA  MAGNESIUM  URINALYSIS, COMPLETE (UACMP) WITH MICROSCOPIC   ____________________________________________   ____________________________________________  RADIOLOGY  CT maxillofacial with IV 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   Patient 64 year old presents emergency department with painful right side of the mouth and facial swelling.  See HPI.  Physical exam shows the area to be  excoriated with some yellow type drainage noted along the right upper mouth which is also draining towards the back of the throat.  Maxillary area is very tender to palpation.  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 Tamala Julian.  He is indicated the patient had a cancerous lesion.  Sent a secure message to Dr. Richardson Landry with ENT who instructed me to have her follow-up with Dr. Frazier Butt at [facility].  Sent a secure message to Dr. Tasia Catchings at [facility] cancer center.  Patient was given both phone numbers as to follow-up by making appointments.  She was given a prescription for Percocet.  Return emergency department if worsening.  She was also given potassium 40 M EQ's p.o.  She was discharged in stable condition    Shannon Schroeder was evaluated in Emergency Department on 07/10/2020 for the symptoms described in the history of present illness. She was evaluated in the context of the global COVID-19 pandemic, which necessitated consideration that the patient might be at risk for infection with the SARS-CoV-2 virus that causes COVID-19. Institutional protocols and algorithms that pertain to the evaluation of patients at risk for COVID-19 are in a state of rapid change based on information released by regulatory bodies including the CDC and federal and state organizations. These policies and algorithms were followed during the patient's care in the ED.    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    Versie Starks, PA-C 07/10/20 1740    Vladimir Crofts, MD 07/10/20 2046

## 2020-07-12 ENCOUNTER — Other Ambulatory Visit: Payer: Self-pay

## 2020-07-12 ENCOUNTER — Encounter: Payer: Self-pay | Admitting: Oncology

## 2020-07-12 ENCOUNTER — Inpatient Hospital Stay: Payer: Self-pay

## 2020-07-12 ENCOUNTER — Inpatient Hospital Stay: Payer: Self-pay | Attending: Oncology | Admitting: Oncology

## 2020-07-12 VITALS — BP 181/97 | HR 88 | Temp 97.9°F | Resp 18 | Ht 66.0 in | Wt 136.1 lb

## 2020-07-12 DIAGNOSIS — C310 Malignant neoplasm of maxillary sinus: Secondary | ICD-10-CM | POA: Insufficient documentation

## 2020-07-12 DIAGNOSIS — Z79899 Other long term (current) drug therapy: Secondary | ICD-10-CM | POA: Insufficient documentation

## 2020-07-12 DIAGNOSIS — D3709 Neoplasm of uncertain behavior of other specified sites of the oral cavity: Secondary | ICD-10-CM | POA: Insufficient documentation

## 2020-07-12 DIAGNOSIS — I671 Cerebral aneurysm, nonruptured: Secondary | ICD-10-CM | POA: Insufficient documentation

## 2020-07-12 DIAGNOSIS — F17210 Nicotine dependence, cigarettes, uncomplicated: Secondary | ICD-10-CM | POA: Insufficient documentation

## 2020-07-12 DIAGNOSIS — M1990 Unspecified osteoarthritis, unspecified site: Secondary | ICD-10-CM | POA: Insufficient documentation

## 2020-07-12 DIAGNOSIS — R634 Abnormal weight loss: Secondary | ICD-10-CM | POA: Insufficient documentation

## 2020-07-12 DIAGNOSIS — Z8673 Personal history of transient ischemic attack (TIA), and cerebral infarction without residual deficits: Secondary | ICD-10-CM | POA: Insufficient documentation

## 2020-07-12 DIAGNOSIS — J449 Chronic obstructive pulmonary disease, unspecified: Secondary | ICD-10-CM | POA: Insufficient documentation

## 2020-07-12 DIAGNOSIS — R519 Headache, unspecified: Secondary | ICD-10-CM | POA: Insufficient documentation

## 2020-07-12 DIAGNOSIS — F319 Bipolar disorder, unspecified: Secondary | ICD-10-CM | POA: Insufficient documentation

## 2020-07-12 DIAGNOSIS — D490 Neoplasm of unspecified behavior of digestive system: Secondary | ICD-10-CM

## 2020-07-12 DIAGNOSIS — R200 Anesthesia of skin: Secondary | ICD-10-CM | POA: Insufficient documentation

## 2020-07-12 DIAGNOSIS — I10 Essential (primary) hypertension: Secondary | ICD-10-CM | POA: Insufficient documentation

## 2020-07-12 DIAGNOSIS — F419 Anxiety disorder, unspecified: Secondary | ICD-10-CM | POA: Insufficient documentation

## 2020-07-12 LAB — BASIC METABOLIC PANEL
Anion gap: 12 (ref 5–15)
BUN: 8 mg/dL (ref 8–23)
CO2: 27 mmol/L (ref 22–32)
Calcium: 9 mg/dL (ref 8.9–10.3)
Chloride: 98 mmol/L (ref 98–111)
Creatinine, Ser: 0.42 mg/dL — ABNORMAL LOW (ref 0.44–1.00)
GFR calc Af Amer: >60 mL/min (ref >60)
GFR calc non Af Amer: >60 mL/min (ref >60)
Glucose, Bld: 114 mg/dL — ABNORMAL HIGH (ref 70–99)
Potassium: 4 mmol/L (ref 3.5–5.1)
Sodium: 137 mmol/L (ref 135–145)

## 2020-07-12 LAB — LACTATE DEHYDROGENASE: LDH: 199 U/L — ABNORMAL HIGH (ref 98–192)

## 2020-07-12 MED ORDER — OXYCODONE-ACETAMINOPHEN 5-325 MG PO TABS
1.0000 | ORAL_TABLET | ORAL | 0 refills | Status: DC | PRN
Start: 2020-07-12 — End: 2020-08-12

## 2020-07-12 MED ORDER — GABAPENTIN 300 MG PO CAPS: 300.0000 mg | ORAL_CAPSULE | Freq: Every day | ORAL | 0 refills | Status: DC

## 2020-07-12 NOTE — Progress Notes (Signed)
Pt here to establish care. Pt reports pain 10/10 to right side of face that shoots into the eye and across her forehead.

## 2020-07-12 NOTE — Progress Notes (Signed)
Hematology/Oncology Consult note [facility] Telephone:([phone] [phone]   Patient Care Team: Patient, No Pcp Per as PCP - General (General Practice) Earlie Server, MD as Consulting Physician (Oncology)  REFERRING PROVIDER: ER physician Dr. Laurel Dimmer COMPLAINTS/REASON FOR VISIT:  Evaluation of abnormal CT scan.  HISTORY OF PRESENTING ILLNESS:   Shannon Schroeder is a  64 y.o.  female with PMH listed below was seen in consultation at the request of ER Dr. [name] for evaluation of abnormal CT scan.  07/10/2020 she presented emergency room for evaluation of right-sided facial swelling and pain in the right side of her mouth for the past 6 weeks.  It started in her mouth with a small ulcer which progressively got worse.  She wears denture.  Not able to chew well.  She eats soft food. She was found to have right upper gum also with yellowish discharge noted. 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  Avoid piloting.,  Right orbital apex, also the right soft palate and palate and tonsil.  Furthermore there is evidence of intracranial extension along the right V2 at the foramen rotundum Small suspicious right level 1A lymph node measuring 7 mm.  Separate small indeterminate but suspicious enhancing soft tissue nodule of the left sublingual space.  Patient was referred to oncology for further evaluation management. Patient denies fever, chills, nausea vomiting diarrhea shortness of breath or cough.  Reports 10 out of 10 severe pain of right face and numbness.  She also has headache. Reports unintentional weight loss  She reports no contactable family members.  She is in the process of pointing her roommate [name] to be her power of attorney.  She also wants to record today's conversation for her roommate  [name]. Patient has 50-pack-year smoking history.  Review of Systems  Constitutional: Positive for appetite change, fatigue and unexpected weight change. Negative for chills and fever.  HENT:   Positive for mouth sores. Negative for hearing loss and voice change.        Facial swelling, pain and numbness  Eyes: Negative for eye problems.  Respiratory: Negative for chest tightness and cough.   Cardiovascular: Negative for chest pain.  Gastrointestinal: Negative for abdominal distention, abdominal pain and blood in stool.  Endocrine: Negative for hot flashes.  Genitourinary: Negative for difficulty urinating and frequency.   Musculoskeletal: Negative for arthralgias.  Skin: Negative for itching and rash.  Neurological: Positive for headaches. Negative for extremity weakness.  Hematological: Negative for adenopathy.  Psychiatric/Behavioral: Negative for confusion.    MEDICAL HISTORY:  Past Medical History:  Diagnosis Date  . 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   Marland Kitchen Anxiety   . Arthritis   . Bipolar disorder ([place])   . COPD (chronic obstructive pulmonary disease) ([place])   . Depression   . Dysrhythmia    H/O V TACH  . Hypertension     SURGICAL HISTORY: Past Surgical History:  Procedure Laterality Date  . ABDOMINAL HYSTERECTOMY     partial  . BREAST SURGERY     biopsy  . CARDIAC CATHETERIZATION     X 2  . CARPECTOMY HAND Right   . CATARACT EXTRACTION W/PHACO Left 03/08/2018   Procedure: CATARACT EXTRACTION PHACO AND INTRAOCULAR LENS PLACEMENT (IOC);  Surgeon: Birder Robson, MD;  Location: [facility] ORS;  Service: Ophthalmology;  Laterality:  Left;  [place] 00:55 AP% 13.5 CDE 7.52 Fluid pack lot # [id number]  . CATARACT EXTRACTION W/PHACO Right 04/05/2018   Procedure: CATARACT EXTRACTION PHACO AND INTRAOCULAR LENS PLACEMENT (IOC);  Surgeon: Birder Robson, MD;  Location: [facility] ORS;  Service: Ophthalmology;  Laterality: Right;  [place]  00:36 AP% 15.9 CDE 5.72 Fluid pack lot # [id number]  . CHOLECYSTECTOMY    . COLONOSCOPY WITH PROPOFOL N/A 04/26/2019   Procedure: COLONOSCOPY WITH PROPOFOL;  Surgeon: Jonathon Bellows, MD;  Location: [facility] ENDOSCOPY;  Service: Gastroenterology;  Laterality: N/A;  . TUBAL LIGATION      SOCIAL HISTORY: Social History   Socioeconomic History  . Marital status: Divorced    Spouse name: Not on file  . Number of children: Not on file  . Years of education: 70  . Highest education level: Bachelor's degree (e.g., BA, AB, BS)  Occupational History  . Occupation: farmer  Tobacco Use  . Smoking status: Current Every Day Smoker    Packs/day: 1.00    Years: 50.00    Pack years: 50.00    Types: Cigarettes  . Smokeless tobacco: Never Used  Vaping Use  . Vaping Use: Never used  Substance and Sexual Activity  . Alcohol use: No  . Drug use: No  . Sexual activity: Not Currently  Other Topics Concern  . Not on file  Social History Narrative   Pt lives at [facility].    Social Determinants of Health   Financial Resource Strain:   . Difficulty of Paying Living Expenses: Not on file  Food Insecurity:   . Worried About Charity fundraiser in the Last Year: Not on file  . Ran Out of Food in the Last Year: Not on file  Transportation Needs:   . Lack of Transportation (Medical): Not on file  . Lack of Transportation (Non-Medical): Not on file  Physical Activity:   . Days of Exercise per Week: Not on file  . Minutes of Exercise per Session: Not on file  Stress:   . Feeling of Stress : Not on file  Social Connections:   . Frequency of Communication with Friends and Family: Not on file  . Frequency of Social Gatherings with Friends and Family: Not on file  . Attends Religious Services: Not on file  . Active Member of Clubs or Organizations: Not on file  . Attends Archivist Meetings: Not on file  . Marital Status: Not on file  Intimate Partner Violence:   . Fear of Current or  Ex-Partner: Not on file  . Emotionally Abused: Not on file  . Physically Abused: Not on file  . Sexually Abused: Not on file    FAMILY HISTORY: Family History  Problem Relation Age of Onset  . Hypertension Mother   . Heart failure Mother   . Hypertension Brother   . Stroke Brother   . Hypertension Son   . Emphysema Maternal Aunt   . Hypertension Paternal Aunt   . Hypertension Paternal Uncle   . Hypertension Maternal Grandmother   . Hypertension Paternal Grandmother     ALLERGIES:  is allergic to fluoxetine, aspirin, belladonna alkaloids, fluoxetine hcl, naproxen, paroxetine hcl, phenobarbital, and prozac [fluoxetine hcl].  MEDICATIONS:  Current Outpatient Medications  Medication Sig Dispense Refill  . acetaminophen (TYLENOL) 500 MG tablet Take 500-1,000 mg by mouth every 6 (six) hours as needed for moderate pain.    Marland Kitchen ibuprofen (ADVIL) 400 MG tablet Take 400 mg by mouth every 6 (six) hours as needed  for mild pain or moderate pain.    Marland Kitchen oxyCODONE-acetaminophen (PERCOCET) 5-325 MG tablet Take 1 tablet by mouth every 4 (four) hours as needed for severe pain. 20 tablet 0  . amLODipine (NORVASC) 5 MG tablet Take 1 tablet (5 mg total) by mouth daily. (Patient not taking: Reported on 07/12/2020) 90 tablet 1  . atenolol (TENORMIN) 25 MG tablet Take 1 tablet (25 mg total) by mouth 2 (two) times daily. (Patient not taking: Reported on 07/12/2020) 180 tablet 3  . gabapentin (NEURONTIN) 300 MG capsule Take 1 capsule (300 mg total) by mouth daily. 30 capsule 0  . lisinopril (ZESTRIL) 20 MG tablet Take 1 tablet (20 mg total) by mouth daily. (Patient not taking: Reported on 07/12/2020) 90 tablet 1  . Melatonin 10 MG TABS Take 10 mg by mouth at bedtime as needed. (Patient not taking: Reported on 07/12/2020)    . VENTOLIN HFA 108 (90 Base) MCG/ACT inhaler INHALE 1 TO 2 PUFFS EVERY 6 HOURS AS NEEDED FOR WHEEZING OR SHORTNESSOF BREATH (Patient not taking: Reported on 09/12/2019) 18 g 0   No current  facility-administered medications for this visit.     PHYSICAL EXAMINATION: ECOG PERFORMANCE STATUS: 1 - Symptomatic but completely ambulatory Vitals:   07/12/20 1109  BP: (!) 181/97  Pulse: 88  Resp: 18  Temp: 97.9 F (36.6 C)   Filed Weights   07/12/20 1109  Weight: 136 lb 1.6 oz (61.7 kg)    Physical Exam Constitutional:      General: She is not in acute distress. HENT:     Head: Normocephalic and atraumatic.  Eyes:     General: No scleral icterus. Cardiovascular:     Rate and Rhythm: Normal rate and regular rhythm.     Heart sounds: Normal heart sounds.  Pulmonary:     Effort: Pulmonary effort is normal. No respiratory distress.     Breath sounds: No wheezing.  Abdominal:     General: Bowel sounds are normal. There is no distension.     Palpations: Abdomen is soft.  Musculoskeletal:        General: No deformity. Normal range of motion.     Cervical back: Normal range of motion and neck supple.  Skin:    General: Skin is warm and dry.     Findings: No erythema or rash.  Neurological:     Mental Status: She is alert and oriented to person, place, and time. Mental status is at baseline.     Cranial Nerves: No cranial nerve deficit.     Coordination: Coordination normal.  Psychiatric:        Mood and Affect: Mood normal.     LABORATORY DATA:  I have reviewed the data as listed Lab Results  Component Value Date   WBC 14.1 (H) 07/10/2020   HGB 12.0 07/10/2020   HCT 35.9 (L) 07/10/2020   MCV 100.6 (H) 07/10/2020   PLT 383 07/10/2020   Recent Labs    07/10/20 1311 07/12/20 1204  NA 136 137  K 2.9* 4.0  CL 97* 98  CO2 28 27  GLUCOSE 100* 114*  BUN 13 8  CREATININE 0.43* 0.42*  CALCIUM 8.9 9.0  GFRNONAA >60 >60  GFRAA >60 >60  PROT 7.0  --   ALBUMIN 3.7  --   AST 20  --   ALT 16  --   ALKPHOS 106  --   BILITOT 0.5  --    Iron/TIBC/Ferritin/ %Sat    Component Value Date/Time  IRON 37 05/03/2018 1826   FERRITIN 52 05/03/2018 1826       RADIOGRAPHIC STUDIES: I have personally reviewed the radiological images as listed and agreed with the findings in the report. CT Maxillofacial W Contrast  Result Date: 07/10/2020 CLINICAL DATA:  64 year old female with right side facial swelling and pain. Discolored areas of oral mucosa on that side. EXAM: CT MAXILLOFACIAL WITH CONTRAST TECHNIQUE: Multidetector CT imaging of the maxillofacial structures was performed with intravenous contrast. Multiplanar CT image reconstructions were also generated. CONTRAST:  68mL OMNIPAQUE IOHEXOL 300 MG/ML  SOLN COMPARISON:  CT head and CTA head and neck 06/15/2018. FINDINGS: Osseous: New since 2019, erosion throughout the floor of the right maxillary sinus, the right half of the hard palate (series 7, image 26) and the entire right maxillary alveolar process. The right maxillary sinus was clear in 2019, but now demonstrates a combination of low-density and enhancing soft tissue, presumably tumor, which infiltrates through the eroded floor of the right maxilla and to the right buccal space and oral cavity (series 6, image 31). The soft tissue abnormality extends throughout much of the floor of the right nasal cavity. The right pterygoid plates are also partially eroded. The abnormal enhancing soft tissue has indistinct margins but encompasses an area of roughly 5 cm (series 2, image 47, series 6, image 31). The right pterygoid palatine fossa is infiltrated (series 2, image 35). The posterior margin of tumor is in proximity to the right mandible on series 2, image 52, but the mandible and TMJ appear spared. The clivus and remaining central skull base also appear intact. Right frontotemporal craniotomy changes are new since 2019. No other bone erosion or suspicious bone lesion identified. Orbits: The right orbital floor remains intact but is abutted by abnormal soft tissue in the right maxillary sinus. There may be tumor at the right orbital apex tracking cephalad from  the pterygoid palatine fossa, but otherwise no mass or inflammation within the right orbit. The other orbital walls are intact. The left orbit also appears negative. Sinuses: Destruction of the right maxilla with abnormal enhancing and low-density masslike soft tissue within the sinus in involving the floor of the right nasal cavity as stated above. Elsewhere the nasal cavity and other paranasal sinuses seem to remain normal. Mild retained secretions and/or mucosal thickening suspected in the right sphenoid sinus. The tympanic cavities and mastoids are clear. Soft tissues: Bulky masslike soft tissue centered at the right maxillary alveolus and involving the right buccal space and oral cavity (series 2, image 55). Involvement of the anterior right pterygoid musculature. The remaining right masticator space appears spared. In addition to cephalad spread to the right pterygoid palatine fossa there appears to be contiguous tumor along the soft palate and right tonsillar pillar on series 2, image 48. The remaining pharyngeal contours are within normal limits. The right parapharyngeal and retropharyngeal spaces remain within normal limits. Central sublingual space, submandibular glands and parotid glands are within normal limits. Small but asymmetric 7 mm right level 2A lymph node on series 2, image 70. On image 69 in the left lateral sublingual space there is an indeterminate 7 mm enhancing soft tissue nodule. This seems too posterior for the left sublingual gland. Bilateral level 2 lymph nodes measure 7-9 mm short axis and appears symmetric. The visible major vascular structures in the upper neck appear patent. Limited intracranial: The right cavernous sinus seems to remain patent. However, there is a subtle asymmetric enhancing soft tissue nodule measuring 4-5 mm at  the right foramen rotundum and abutting the cavernous sinus on series 6, image 46. Definite intracranial extension of tumor. Sequelae of right MCA aneurysm  clipping. IMPRESSION: 1. Bulky and indistinct enhancing tumor which has aggressively destroyed the right maxilla, infiltrates along the right buccal space, floor of the right nasal cavity, into the right pterygoid palatine fossa, the right orbital apex, and also the right soft palate and palatine tonsil. Furthermore, there is evidence of intracranial extension along the right V2 at the foramen rotundum. 2. Small but suspicious right level 1A lymph node measuring 7 mm. And there is a separate small indeterminate but suspicious enhancing soft tissue nodule of the left sublingual space. Electronically Signed   By: Genevie Ann M.D.   On: 07/10/2020 16:26      ASSESSMENT & PLAN:  1. Right-sided face pain   2. Tumor of oral cavity   3. Weight loss    #CT was independently reviewed by me and discussed with patient. CT and physical examination findings are very concerning for tumor of right maxilla infiltrates right buccal, floor of nasal cavity, right pterygoid palatine fossa, the right orbital apex, and also the right soft palate and palatine tonsil.  Also intracranial extension. I will obtain MRI brain with and without contrast as well as PET scan to determine the extent of disease. Patient needs to have tissue diagnosis.  I discussed with Dr. Kathyrn Sheriff and the patient is referred to ENT for further evaluation biopsy.  #Right facial numbness and the pain Patient takes Percocet 5/325 every 4 hours as needed.  I will review her pain medication. Recommend to add gabapentin 300 mg daily.  Will titrate dose up if needed. Refer to palliative care service.  We will also consult social worker as patient is concerned about her insurance coverage and financial difficulty.  Weight loss, I recommend patient to start nutrition supplementation.  Patient was given nutrition supplementation samples today in office.  Refer to nutritionist.   Orders Placed This Encounter  Procedures  . MR Brain W Wo Contrast     Standing Status:   Future    Standing Expiration Date:   07/12/2021    Order Specific Question:   If indicated for the ordered procedure, I authorize the administration of contrast media per Radiology protocol    Answer:   Yes    Order Specific Question:   What is the patient's sedation requirement?    Answer:   No Sedation    Order Specific Question:   Does the patient have a pacemaker or implanted devices?    Answer:   No    Order Specific Question:   Use SRS Protocol?    Answer:   Yes    Order Specific Question:   Radiology Contrast Protocol - do NOT remove file path    Answer:   [email or web address]    Order Specific Question:   Preferred imaging location?    Answer:   [facility] (table limit - 550lbs)  . NM PET Image Initial (PI) Skull Base To Thigh    Standing Status:   Future    Standing Expiration Date:   07/12/2021    Order Specific Question:   If indicated for the ordered procedure, I authorize the administration of a radiopharmaceutical per Radiology protocol    Answer:   Yes    Order Specific Question:   Preferred imaging location?    Answer:   [facility]    Order Specific Question:   Radiology Contrast Protocol -  do NOT remove file path    Answer:   [email or web address]  . Basic metabolic panel    Standing Status:   Future    Number of Occurrences:   1    Standing Expiration Date:   07/12/2021  . Lactate dehydrogenase    Standing Status:   Future    Number of Occurrences:   1    Standing Expiration Date:   07/12/2021  . Amb Referral to Nutrition and Diabetic Education    Referral Priority:   Routine    Referral Type:   Consultation    Referral Reason:   Specialty Services Required    Number of Visits Requested:   1  . Ambulatory Referral to Palliative Care    Referral Priority:   Routine    Referral Type:   Consultation    Referral Reason:   Advance Care Planning    Referred to  Provider:   Borders, Kirt Boys, NP    Number of Visits Requested:   1  . Ambulatory referral to ENT    Referral Priority:   Routine    Referral Type:   Consultation    Referral Reason:   Specialty Services Required    Referred to Provider:   Margaretha Sheffield, MD    Requested Specialty:   Otolaryngology    Number of Visits Requested:   1    All questions were answered. The patient knows to call the clinic with any problems questions or concerns.   No ref. provider found    Return of visit:  Thank you for this kind referral and the opportunity to participate in the care of this patient. A copy of today's note is routed to referring provider    Earlie Server, MD, PhD Hematology Oncology [facility] at [facility] Pager- [phone] 07/12/2020

## 2020-07-15 ENCOUNTER — Other Ambulatory Visit: Payer: Self-pay | Admitting: Oncology

## 2020-07-15 ENCOUNTER — Telehealth: Payer: Self-pay

## 2020-07-15 MED ORDER — ALPRAZOLAM 0.5 MG PO TABS: 0.5000 mg | ORAL_TABLET | ORAL | 0 refills | Status: DC

## 2020-07-15 NOTE — Telephone Encounter (Signed)
Is she asking for anxiety relief for MRI?

## 2020-07-15 NOTE — Telephone Encounter (Signed)
Yes

## 2020-07-15 NOTE — Telephone Encounter (Signed)
Will get the rx signed and fax to her pharmacy.

## 2020-07-15 NOTE — Telephone Encounter (Signed)
Message received from [facility]: Patient called and wanted to know if you will give her something to relax before her MRI on Wednesday she use [facility]. Thanks

## 2020-07-15 NOTE — Telephone Encounter (Signed)
Ordered Xanax. Her pharmacy does not accept E- Rx for controlled substance. I printed to the printer at the back, will sign. She needs to pick it up

## 2020-07-17 ENCOUNTER — Encounter
Admission: RE | Admit: 2020-07-17 | Discharge: 2020-07-17 | Disposition: A | Discharge status: Home / Self Care | Payer: Self-pay | Source: Ambulatory Visit | Attending: Oncology | Admitting: Oncology

## 2020-07-17 ENCOUNTER — Ambulatory Visit
Admission: RE | Admit: 2020-07-17 | Discharge: 2020-07-17 | Disposition: A | Discharge status: Home / Self Care | Payer: Self-pay | Source: Ambulatory Visit | Attending: Oncology | Admitting: Oncology

## 2020-07-17 ENCOUNTER — Other Ambulatory Visit: Payer: Self-pay

## 2020-07-17 DIAGNOSIS — R519 Headache, unspecified: Secondary | ICD-10-CM | POA: Insufficient documentation

## 2020-07-17 LAB — GLUCOSE, CAPILLARY: Glucose-Capillary: 102 mg/dL — ABNORMAL HIGH (ref 70–99)

## 2020-07-17 IMAGING — CT NM PET TUM IMG INITIAL (PI) SKULL BASE T - THIGH
11 series · 19 of 25 positions shown · non-contrast
Comparison: CT maxillofacial with contrast [DATE]

CLINICAL DATA: Initial treatment strategy for head and neck cancer.

EXAM:
NUCLEAR MEDICINE PET SKULL BASE TO THIGH
TECHNIQUE: 7.45 mCi F-18 FDG was injected intravenously. Full-ring PET imaging
was performed from the skull base to thigh after the radiotracer. CT
data was obtained and used for attenuation correction and anatomic
localization.
Fasting blood glucose: 102 mg/dl

[Series 3: ct wb 5.0 b30f · axial · 5.0mm · 0.98mm/px · z∈[-264,+168]mm · 2 of 290 slices shown]
[im 1/290]
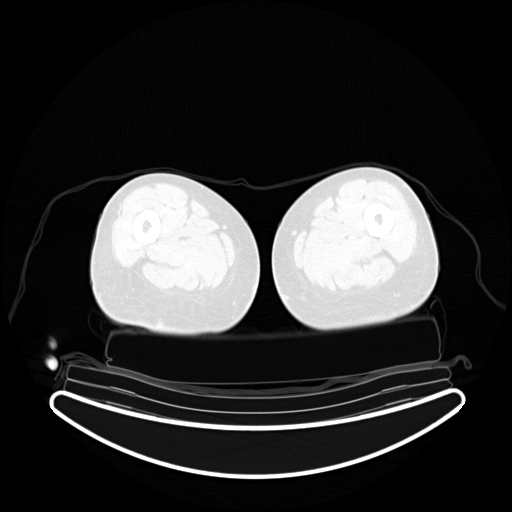
[im 145/290]
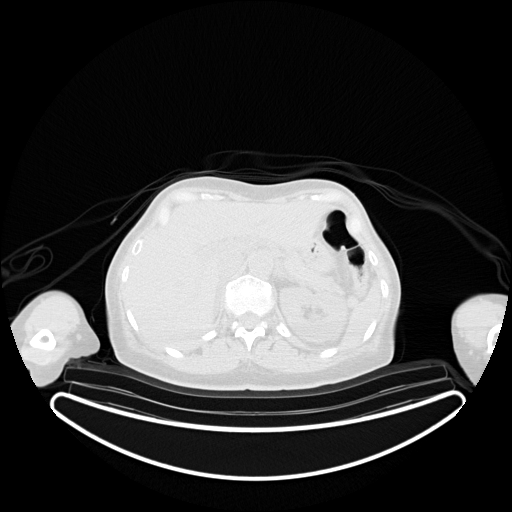

[Series 5: pet wb uncorrected (nac) · axial · 5.0mm · 4.07mm/px · z∈[-264,+604]mm · 3 of 290 slices shown]
[im 1/290]
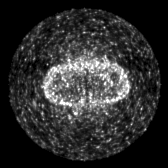
[im 145/290]
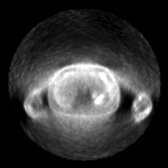
[im 290/290]
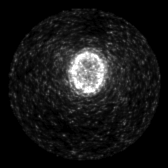

[Series 6: pet wb (ac) · axial · 5.0mm · 3.13mm/px · z∈[+168,+604]mm · 2 of 290 slices shown]
[im 145/290]
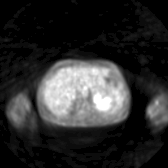
[im 290/290]
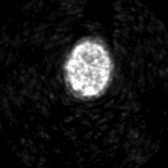

[Series 603: pet_ct axial fused · 3 of 287 slices shown]
[im 1/287]
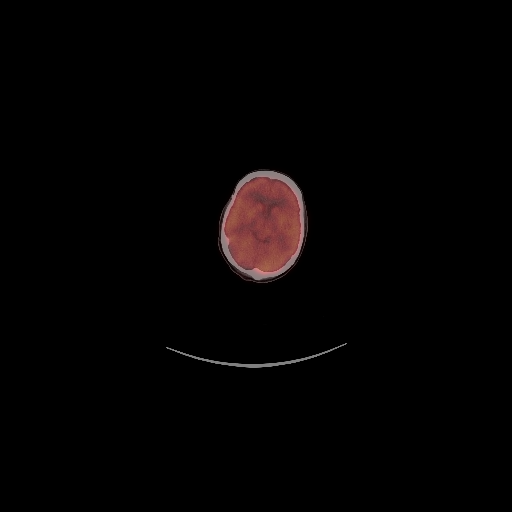
[im 191/287]
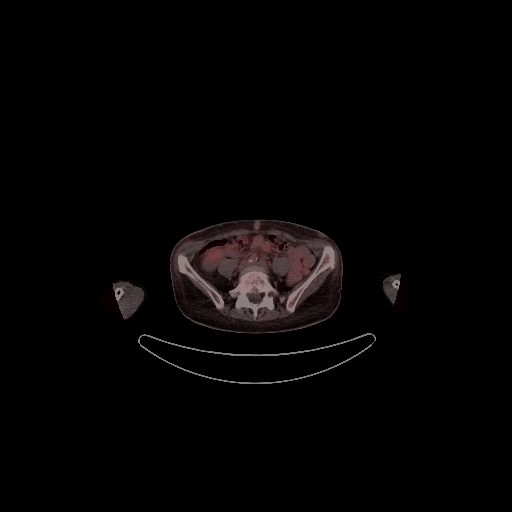
[im 287/287]
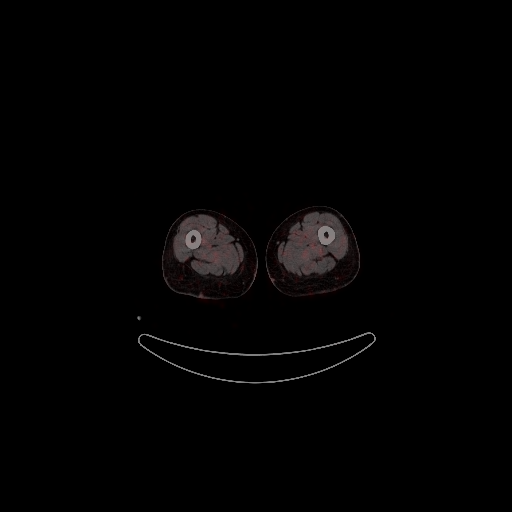

[Series 604: pet_ct fused coronal · 1 of 94 slices shown]
[im 1/94]
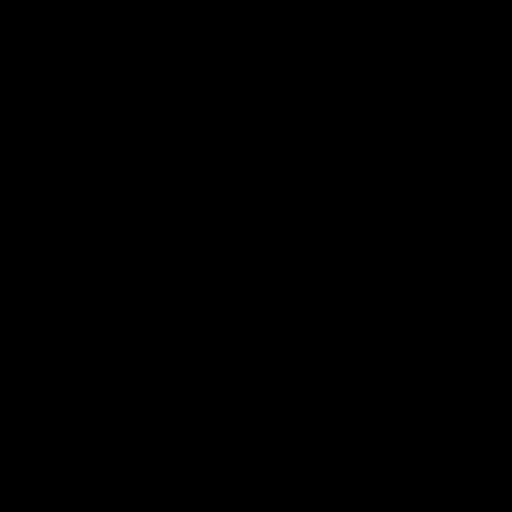

[Series 605: pet_ct sagittal fused · 1 of 161 slices shown]
[im 161/161]
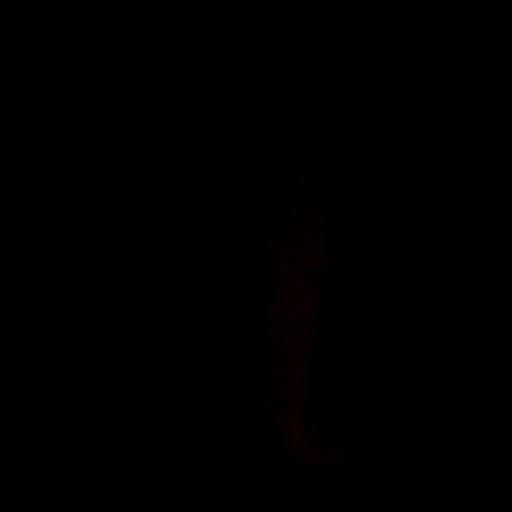

[Series 606: pet axial · 3 of 290 slices shown]
[im 1/290]
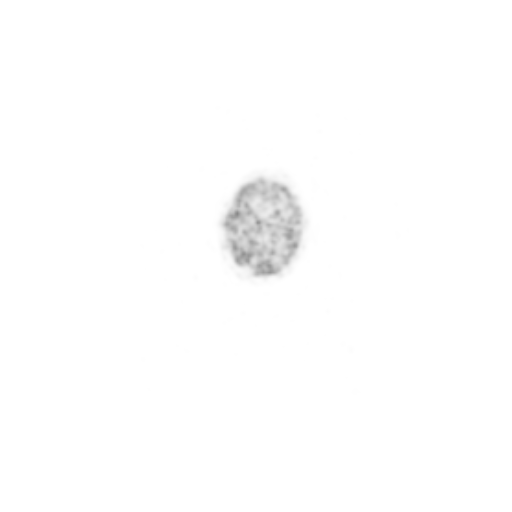
[im 97/290]
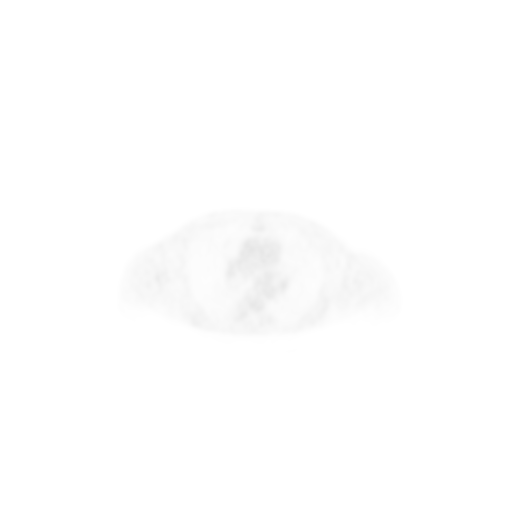
[im 290/290]
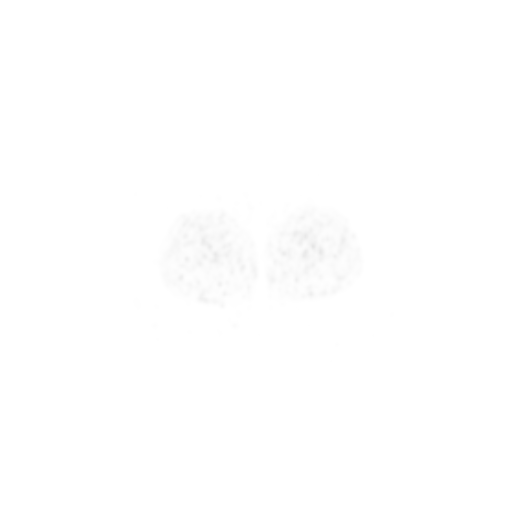

[Series 607: pet coronal · 1 of 98 slices shown]
[im 1/98]
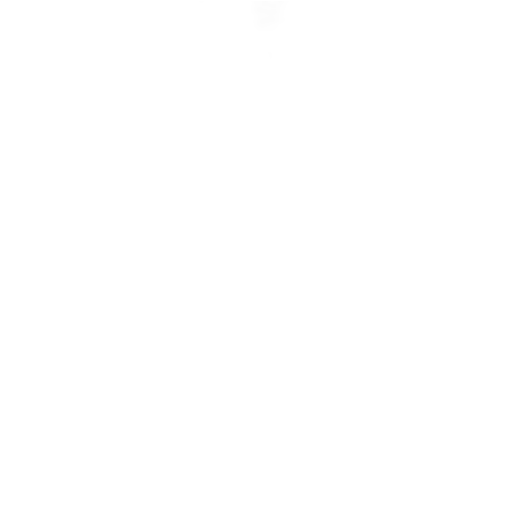

[Series 608: pet sagittal · 1 of 175 slices shown]
[im 1/175]
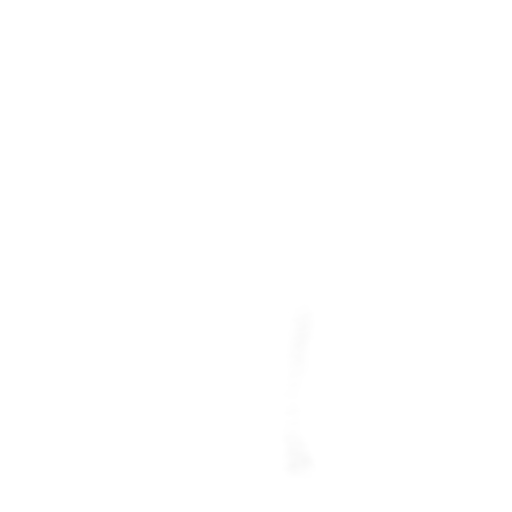

[Series 1119: results mm oncology reading · 1.0mm · 0.45mm/px · 1 of 3 slices shown (1 of 2)]
[im 1/3]
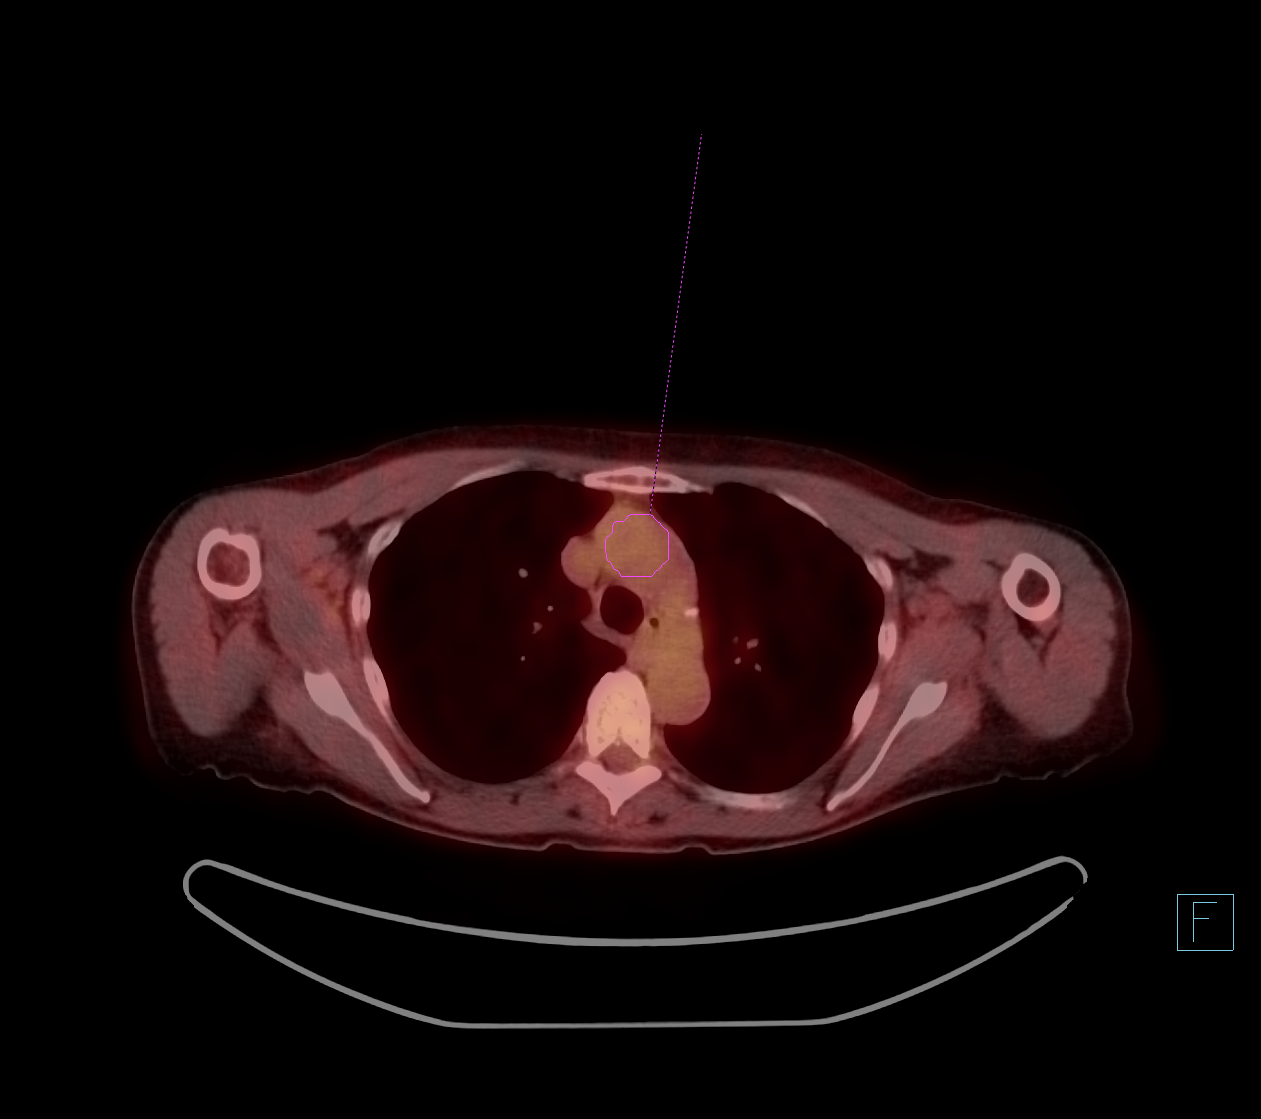

[Series 1156: results mm oncology reading · 1.2mm · 1.25mm/px · 1 of 1 slices shown (2 of 2)]
[im 1/1]
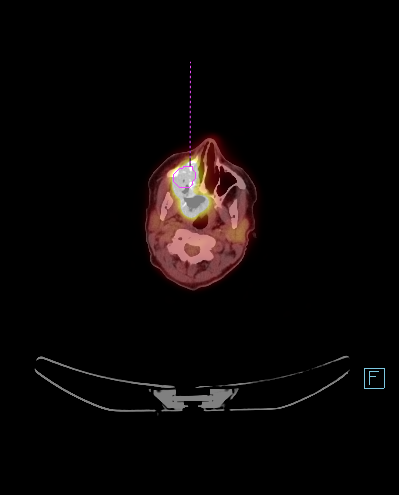

[19 of 25 positions shown; findings below may reference images not displayed]

FINDINGS: Mediastinal blood pool activity: SUV max

Liver activity: SUV max NA

NECK: The previously characterized soft tissue mass involving the
right maxilla, right buccal space, floor of right nasal cavity and
right pterygoid palatine fossa, right orbital apex, right soft
palate and right palatine tonsil is intensely FDG avid and has an
SUV max of 13.74.

The recently described suspicious, 7 mm right level 1a lymph node
has an SUV max of 2.12.

Incidental CT findings: none

CHEST: No hypermetabolic mediastinal or hilar nodes. No suspicious
pulmonary nodules on the CT scan.

Incidental CT findings: Aortic atherosclerosis.

ABDOMEN/PELVIS: No abnormal hypermetabolic activity within the
liver, pancreas, adrenal glands, or spleen. No hypermetabolic lymph
nodes in the abdomen or pelvis.

Incidental CT findings: Cholecystectomy.  Aortic atherosclerosis.

SKELETON: No signs of FDG avid distant osseous metastasis.

Incidental CT findings: none
IMPRESSION: 1. Intense FDG uptake is associated with the large enhancing tumor
which involves the right maxilla, right nasal cavity, right
pterygoid palatine fossa, right orbital apex and right soft tissue
palate and palatine tonsil.
2. Mild nonspecific FDG uptake is associated with the recently
characterized suspicious right level 1A lymph node which measures 7
mm and has an SUV max of 2.12.
3. No signs of FDG avid distant metastatic disease.
4.  Aortic Atherosclerosis ([0X]-[0X]).

## 2020-07-17 IMAGING — MR MR HEAD WO/W CM
15 series · 46 of 48 positions shown · IV contrast (gadavist)
Comparison: Maxillofacial CT [DATE]

CLINICAL DATA: Right-sided facial pain for 4-5 months. Facial
tumor. Possible perineural spread.

EXAM:
MRI HEAD WITHOUT AND WITH CONTRAST
TECHNIQUE: Multiplanar, multiecho pulse sequences of the brain and surrounding
structures were obtained without and with intravenous contrast.
CONTRAST:  6mL GADAVIST GADOBUTROL 1 MMOL/ML IV SOLN

[Series 5: ax dwi_tracew · axial · 3.0mm · 0.60mm/px · z∈[-124,+30]mm · 4 of 48 slices shown]
[im 1/48]
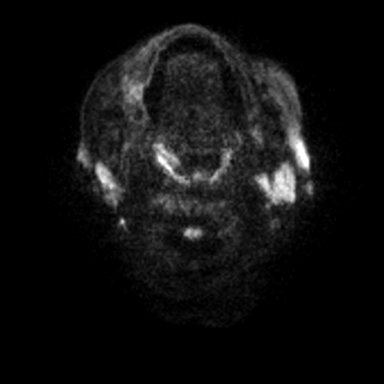
[im 16/48]
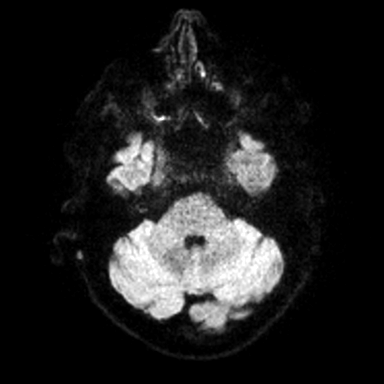
[im 32/48]
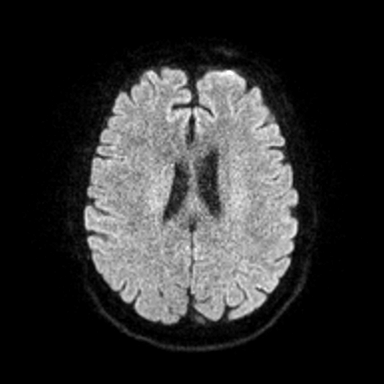
[im 48/48]
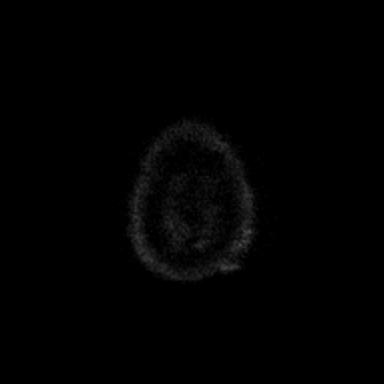

[Series 6: ax dwi_adc · axial · 3.0mm · 0.60mm/px · z∈[-124,+30]mm · 3 of 48 slices shown]
[im 1/48]
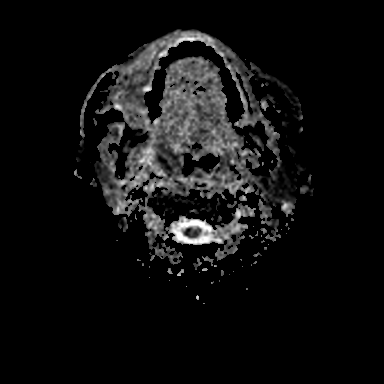
[im 24/48]
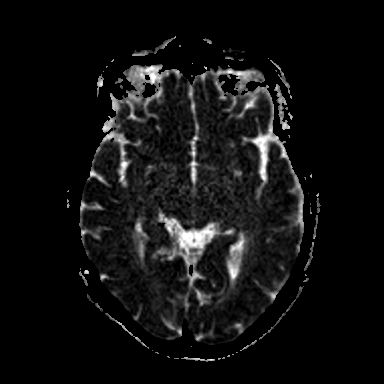
[im 48/48]
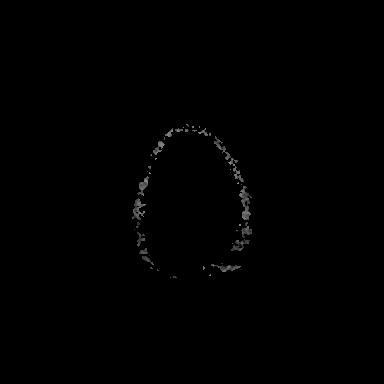

[Series 7: cor dwi_tracew · coronal · 5.0mm · 0.60mm/px · 2 of 38 slices shown]
[im 1/38]
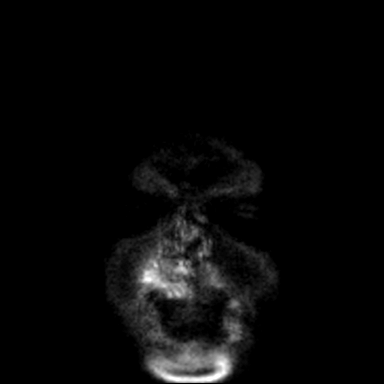
[im 38/38]
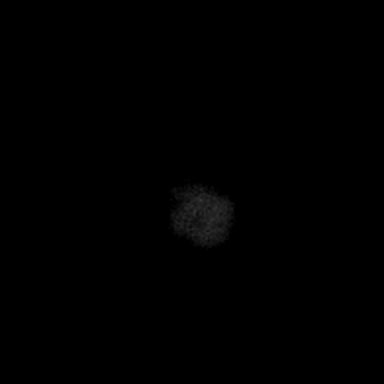

[Series 8: cor dwi_adc · coronal · 5.0mm · 0.60mm/px · 2 of 38 slices shown]
[im 1/38]
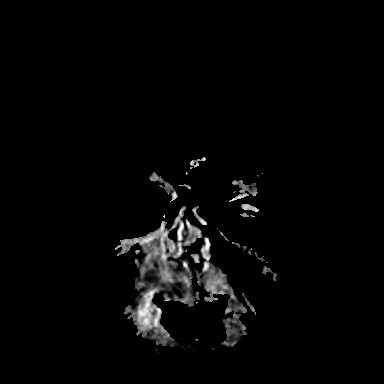
[im 38/38]
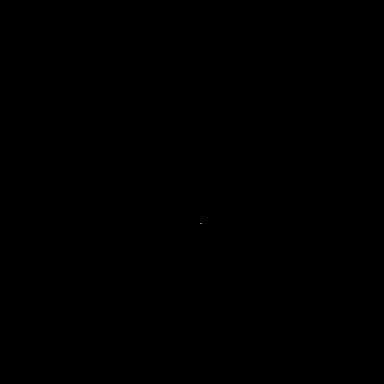

[Series 9: T1 · sagittal · 5.0mm · 0.62mm/px · 1 of 25 slices shown (1 of 2)]
[im 1/25]
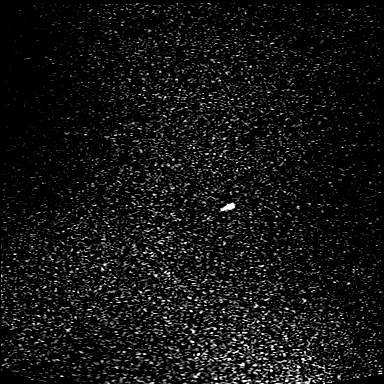

[Series 10: T2 · axial · 5.0mm · 0.53mm/px · 1 of 25 slices shown]
[im 1/25]
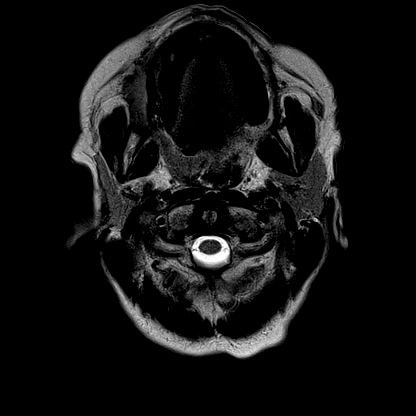

[Series 11: mag_images · axial · 3.0mm · 0.90mm/px · z∈[-130,+45]mm · 3 of 60 slices shown]
[im 1/60]
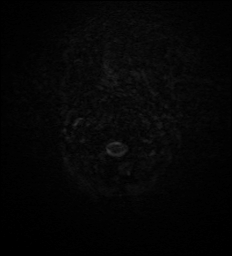
[im 30/60]
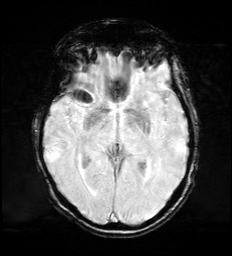
[im 60/60]
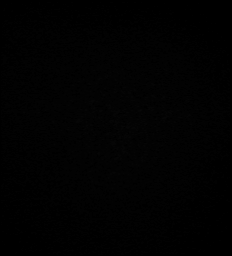

[Series 12: pha_images · axial · 3.0mm · 0.90mm/px · z∈[-130,+45]mm · 3 of 59 slices shown]
[im 1/59]
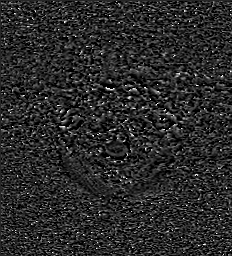
[im 30/59]
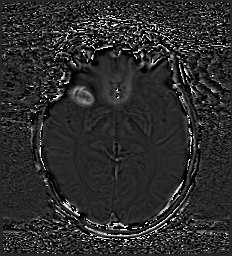
[im 59/59]
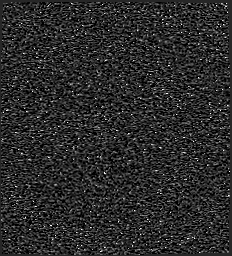

[Series 13: swi_images · axial · 3.0mm · 0.90mm/px · z∈[-130,-44]mm · 2 of 60 slices shown]
[im 1/60]
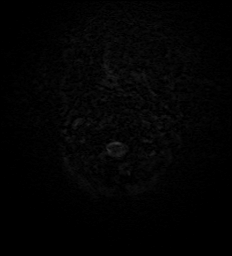
[im 30/60]
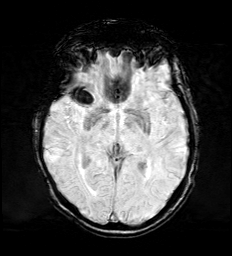

[Series 15: FLAIR · axial · 3.0mm · 0.53mm/px · z∈[-122,+39]mm · 3 of 55 slices shown]
[im 1/55]
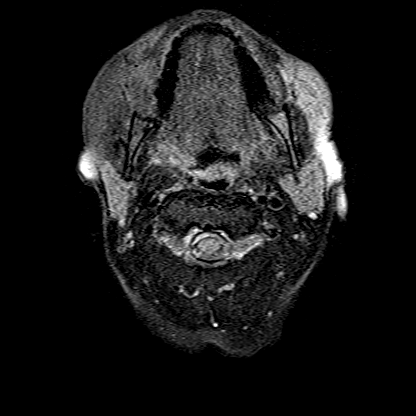
[im 28/55]
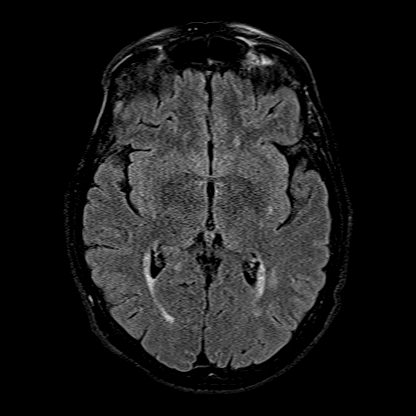
[im 55/55]
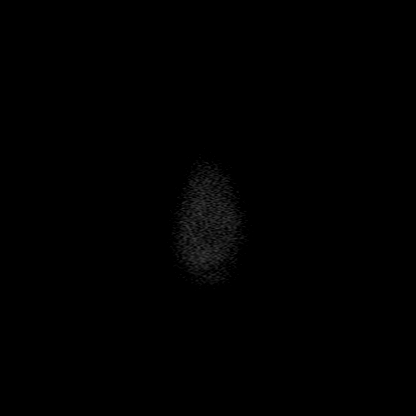

[Series 16: T1 · axial · 1.0mm · 0.98mm/px · z∈[-135,+39]mm · 8 of 176 slices shown (2 of 2)]
[im 1/176]
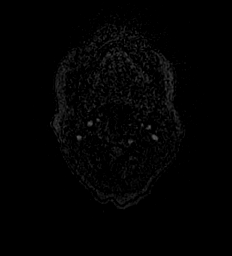
[im 22/176]
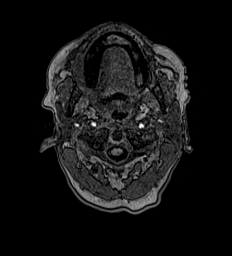
[im 44/176]
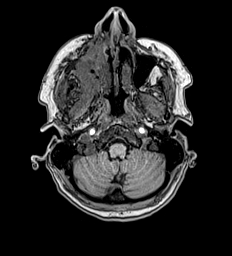
[im 66/176]
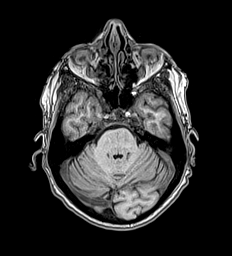
[im 110/176]
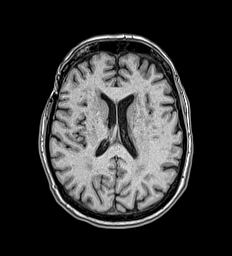
[im 132/176]
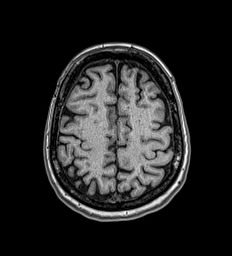
[im 154/176]
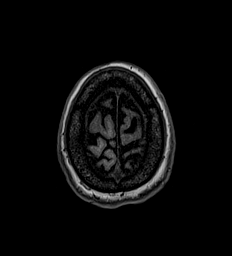
[im 176/176]
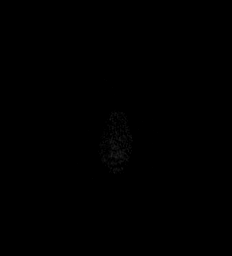

[Series 17: T2 post-contrast · coronal · 5.0mm · 0.57mm/px · 2 of 29 slices shown]
[im 1/29]
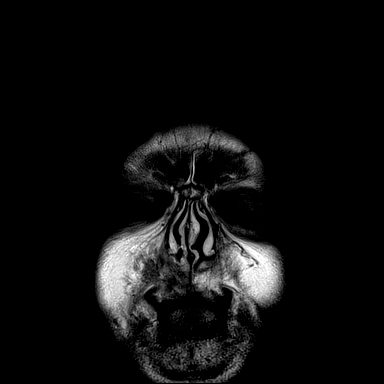
[im 29/29]
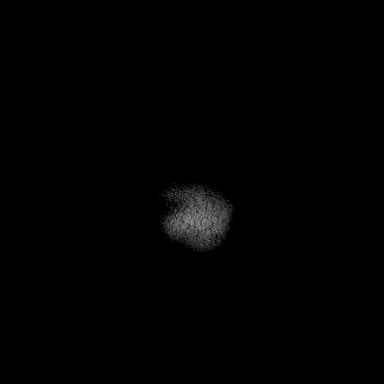

[Series 18: T1 post-contrast · axial · 1.0mm · 0.98mm/px · z∈[-135,+39]mm · 9 of 176 slices shown (1 of 3)]
[im 1/176]
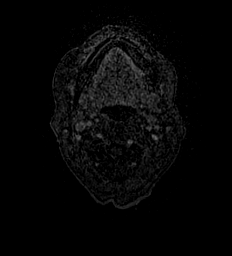
[im 22/176]
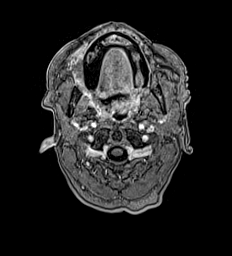
[im 44/176]
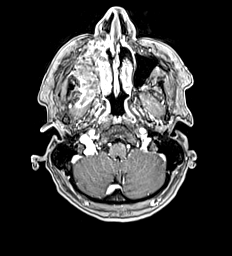
[im 66/176]
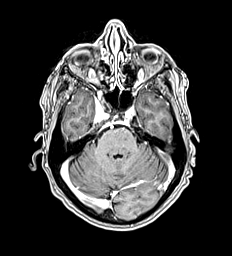
[im 88/176]
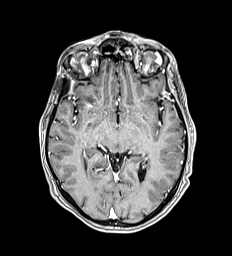
[im 110/176]
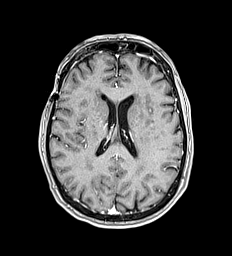
[im 132/176]
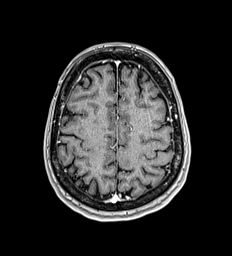
[im 154/176]
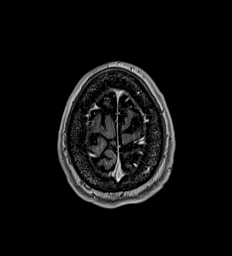
[im 176/176]
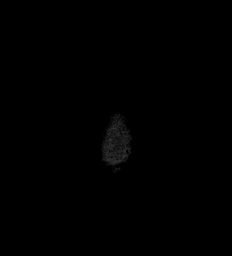

[Series 19: T1 post-contrast · coronal · 5.0mm · 0.57mm/px · 2 of 29 slices shown (2 of 3)]
[im 1/29]
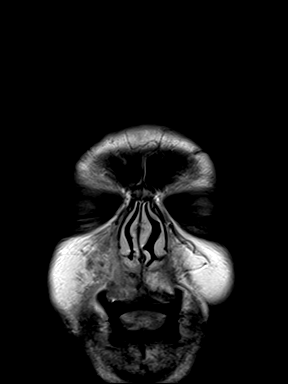
[im 29/29]
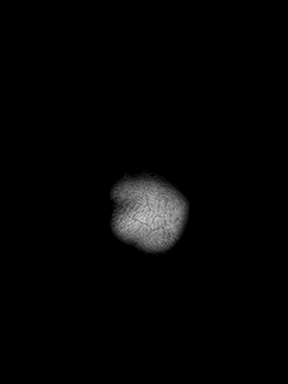

[Series 20: T1 post-contrast · sagittal · 5.0mm · 0.62mm/px · 1 of 25 slices shown (3 of 3)]
[im 1/25]
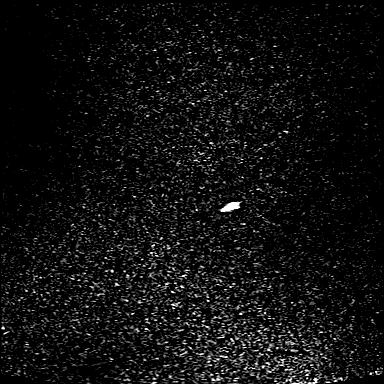

[46 of 48 positions shown; findings below may reference images not displayed]

FINDINGS: Brain: No acute infarct, acute hemorrhage or extra-axial collection.
Multifocal hyperintense T2-weighted signal within the white matter.
Normal volume of CSF spaces. No chronic microhemorrhage. Normal
midline structures. There is abnormal thickening and contrast
enhancement of the maxillary branch of the right trigeminal nerve
within the right FELNER and foramen rotundum. Abnormal
enhancement extends to the pterygopalatine fossa and along the
lateral posterior aspect of the right maxillary sinus. There is also
abnormal enhancement extending into the right pterygoid muscles.

Vascular: Normal flow voids.

Skull and upper cervical spine: Large right facial mass erodes
through the right maxilla including the floor of the maxillary
sinus, the right half of the hard palate and the right maxillary
alveolar process.

Sinuses/Orbits: Erosive mass involving the right maxillary sinus.
Otherwise, the paranasal sinuses are clear. There is abnormal
enhancement along the right infraorbital nerve.

Other: None
IMPRESSION: 1. Large right facial mass with right maxillary erosion involving
the maxillary sinus, hard palate and pterygoid plates and muscles.
2. Perineural spread along multiple branches of the maxillary
division of the right trigeminal nerve at the infraorbital canal,
pterygopalatine fossa and foramen rotundum.

## 2020-07-17 MED ORDER — FLUDEOXYGLUCOSE F - 18 (FDG) INJECTION
7.0000 | Freq: Once | INTRAVENOUS | Status: AC | PRN
  Administered 2020-07-17: 7.45 via INTRAVENOUS

## 2020-07-17 MED ORDER — GADOBUTROL 1 MMOL/ML IV SOLN
6.0000 mL | Freq: Once | INTRAVENOUS | Status: AC | PRN
  Administered 2020-07-17: 6 mL via INTRAVENOUS

## 2020-07-18 ENCOUNTER — Encounter: Payer: Self-pay | Admitting: Hospice and Palliative Medicine

## 2020-07-18 ENCOUNTER — Inpatient Hospital Stay (HOSPITAL_BASED_OUTPATIENT_CLINIC_OR_DEPARTMENT_OTHER): Payer: Self-pay | Admitting: Hospice and Palliative Medicine

## 2020-07-18 VITALS — BP 150/93 | HR 81 | Temp 98.2°F | Resp 16 | Wt 129.4 lb

## 2020-07-18 DIAGNOSIS — G893 Neoplasm related pain (acute) (chronic): Secondary | ICD-10-CM

## 2020-07-18 DIAGNOSIS — Z515 Encounter for palliative care: Secondary | ICD-10-CM

## 2020-07-18 DIAGNOSIS — D490 Neoplasm of unspecified behavior of digestive system: Secondary | ICD-10-CM

## 2020-07-18 DIAGNOSIS — Z7189 Other specified counseling: Secondary | ICD-10-CM

## 2020-07-18 MED ORDER — GABAPENTIN 300 MG PO CAPS: 300.0000 mg | ORAL_CAPSULE | Freq: Three times a day (TID) | ORAL | 0 refills | Status: DC

## 2020-07-18 NOTE — Progress Notes (Signed)
Patient has lost 7 lbs since last visit and she does drink 2 Ensure a day.  Patient located on right side of face and throat pain scale is 6/10 today.  Does have Oxycodone at home but hasn't taken.  She takes 3 Tylenol arthritis strength with 3 Ibuprofen liquid gel for the pain.  Only takes the Oxycodone if the Tylenol and Iburporfen doesn't help with the pain.

## 2020-07-18 NOTE — Progress Notes (Signed)
[facility]  [phone] [phone]   Name: Shannon Schroeder Date: 07/18/2020 MRN: [id number]  DOB: [birth year]  Patient Care Team: Patient, No Pcp Per as PCP - General (General Practice) Earlie Server, MD as Consulting Physician (Oncology)    REASON FOR CONSULTATION: Shannon Schroeder is a 64 y.o. 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  MRI of the brain on 07/17/2020 revealed large right facial mass with right maxillary erosion and perineural spread along multiple branches of the maxillary division and right trigeminal nerve.  Patient was referred to palliative care to help address goals and manage ongoing symptoms such as pain.  SOCIAL HISTORY:     reports that she has been smoking cigarettes. She has a 50.00 pack-year smoking history. She has never used smokeless tobacco. She reports that she does not drink alcohol and does not use drugs.   Patient is unmarried.  She has children from whom she is estranged.  She lives with a roommate and feels like she has good social support.  She works at [employer] in [employer].  ADVANCE DIRECTIVES:  Does not have  CODE STATUS:   PAST MEDICAL HISTORY: Past Medical History:  Diagnosis Date  . 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   Marland Kitchen Anxiety   . Arthritis   . Bipolar disorder ([place])   . COPD (chronic obstructive pulmonary disease) ([place])   . Depression   . Dysrhythmia    H/O V TACH  . Hypertension     PAST SURGICAL HISTORY:  Past Surgical History:  Procedure Laterality Date  . ABDOMINAL HYSTERECTOMY     partial  . BREAST SURGERY     biopsy  . CARDIAC CATHETERIZATION     X 2  . CARPECTOMY HAND Right    . CATARACT EXTRACTION W/PHACO Left 03/08/2018   Procedure: CATARACT EXTRACTION PHACO AND INTRAOCULAR LENS PLACEMENT (IOC);  Surgeon: Birder Robson, MD;  Location: [facility] ORS;  Service: Ophthalmology;  Laterality: Left;  [place] 00:55 AP% 13.5 CDE 7.52 Fluid pack lot # [id number]  . CATARACT EXTRACTION W/PHACO Right 04/05/2018   Procedure: CATARACT EXTRACTION PHACO AND INTRAOCULAR LENS PLACEMENT (IOC);  Surgeon: Birder Robson, MD;  Location: [facility] ORS;  Service: Ophthalmology;  Laterality: Right;  [place] 00:36 AP% 15.9 CDE 5.72 Fluid pack lot # [id number]  . CHOLECYSTECTOMY    . COLONOSCOPY WITH PROPOFOL N/A 04/26/2019   Procedure: COLONOSCOPY WITH PROPOFOL;  Surgeon: Jonathon Bellows, MD;  Location: [facility] ENDOSCOPY;  Service: Gastroenterology;  Laterality: N/A;  . TUBAL LIGATION      HEMATOLOGY/ONCOLOGY HISTORY:  Oncology History   No history exists.    ALLERGIES:  is allergic to fluoxetine, aspirin, belladonna alkaloids, fluoxetine hcl, naproxen, paroxetine hcl, phenobarbital, and prozac [fluoxetine hcl].  MEDICATIONS:  Current Outpatient Medications  Medication Sig Dispense Refill  . ALPRAZolam (XANAX) 0.5 MG tablet Take 1 tablet (0.5 mg total) by mouth See admin instructions. Take 30-60 minutes prior to the MRI. If needed, repeat another 0.76m dose after 30-60 minutes of first dose. 2 tablet 0  . amLODipine (NORVASC) 5 MG tablet Take 1 tablet (5 mg total) by mouth daily. (Patient not taking: Reported on 07/12/2020) 90 tablet 1  . atenolol (TENORMIN) 25 MG tablet Take 1  tablet (25 mg total) by mouth 2 (two) times daily. (Patient not taking: Reported on 07/12/2020) 180 tablet 3  . gabapentin (NEURONTIN) 300 MG capsule Take 1 capsule (300 mg total) by mouth daily. 30 capsule 0  . ibuprofen (ADVIL) 400 MG tablet Take 400 mg by mouth every 6 (six) hours as needed for mild pain or moderate pain.    Marland Kitchen lisinopril (ZESTRIL) 20 MG tablet Take 1 tablet (20 mg total) by mouth daily. (Patient not taking: Reported  on 07/12/2020) 90 tablet 1  . Melatonin 10 MG TABS Take 10 mg by mouth at bedtime as needed. (Patient not taking: Reported on 07/12/2020)    . oxyCODONE-acetaminophen (PERCOCET) 5-325 MG tablet Take 1 tablet by mouth every 4 (four) hours as needed for severe pain. 30 tablet 0  . VENTOLIN HFA 108 (90 Base) MCG/ACT inhaler INHALE 1 TO 2 PUFFS EVERY 6 HOURS AS NEEDED FOR WHEEZING OR SHORTNESSOF BREATH (Patient not taking: Reported on 09/12/2019) 18 g 0   No current facility-administered medications for this visit.    VITAL SIGNS: LMP 11/19/1988 (Exact Date)  There were no vitals filed for this visit.  Estimated body mass index is 21.97 kg/m as calculated from the following:   Height as of 07/12/20: '5\' 6"'  (1.676 m).   Weight as of 07/12/20: 136 lb 1.6 oz (61.7 kg).  LABS: CBC:    Component Value Date/Time   WBC 14.1 (H) 07/10/2020 1311   HGB 12.0 07/10/2020 1311   HGB 13.8 03/15/2019 1201   HCT 35.9 (L) 07/10/2020 1311   HCT 39.0 03/15/2019 1201   HCT 36 12/18/2016 0000   PLT 383 07/10/2020 1311   PLT 325 03/15/2019 1201   MCV 100.6 (H) 07/10/2020 1311   MCV 96 03/15/2019 1201   MCV 98 12/18/2016 0000   NEUTROABS 11.0 (H) 07/10/2020 1311   NEUTROABS 2.4 05/03/2018 1826   LYMPHSABS 1.9 07/10/2020 1311   LYMPHSABS 1.6 05/03/2018 1826   MONOABS 0.9 07/10/2020 1311   EOSABS 0.2 07/10/2020 1311   EOSABS 0.2 05/03/2018 1826   BASOSABS 0.1 07/10/2020 1311   BASOSABS 0.1 05/03/2018 1826   BASOSABS 2 12/18/2016 0000   Comprehensive Metabolic Panel:    Component Value Date/Time   NA 137 07/12/2020 1204   NA 135 03/15/2019 1201   K 4.0 07/12/2020 1204   K 4.2 06/26/2015 0000   CL 98 07/12/2020 1204   CL 91 05/25/2016 0000   CO2 27 07/12/2020 1204   CO2 25 08/19/2016 0000   BUN 8 07/12/2020 1204   BUN 12 03/15/2019 1201   BUN 11 12/18/2016 0000   CREATININE 0.42 (L) 07/12/2020 1204   CREATININE 0.67 12/18/2016 0000   GLUCOSE 114 (H) 07/12/2020 1204   CALCIUM 9.0 07/12/2020 1204    CALCIUM 8.9 12/18/2016 0000   AST 20 07/10/2020 1311   AST 15 12/18/2016 0000   ALT 16 07/10/2020 1311   ALT 11 08/19/2016 0000   ALKPHOS 106 07/10/2020 1311   ALKPHOS 117 12/18/2016 0000   BILITOT 0.5 07/10/2020 1311   BILITOT <0.2 03/15/2019 1201   BILITOT 0.3 12/18/2016 0000   PROT 7.0 07/10/2020 1311   PROT 6.8 03/15/2019 1201   ALBUMIN 3.7 07/10/2020 1311   ALBUMIN 4.5 03/15/2019 1201   ALBUMIN 4.5 12/18/2016 0000    RADIOGRAPHIC STUDIES: MR Brain W Wo Contrast  Result Date: 07/17/2020 CLINICAL DATA:  Right-sided facial pain for 4-5 months. Facial tumor. Possible perineural spread. EXAM: MRI HEAD WITHOUT AND WITH CONTRAST  TECHNIQUE: Multiplanar, multiecho pulse sequences of the brain and surrounding structures were obtained without and with intravenous contrast. CONTRAST:  28m GADAVIST GADOBUTROL 1 MMOL/ML IV SOLN COMPARISON:  Maxillofacial CT 07/10/2020 FINDINGS: Brain: No acute infarct, acute hemorrhage or extra-axial collection. Multifocal hyperintense T2-weighted signal within the white matter. Normal volume of CSF spaces. No chronic microhemorrhage. Normal midline structures. There is abnormal thickening and contrast enhancement of the maxillary branch of the right trigeminal nerve within the right Meckel cave and foramen rotundum. Abnormal enhancement extends to the pterygopalatine fossa and along the lateral posterior aspect of the right maxillary sinus. There is also abnormal enhancement extending into the right pterygoid muscles. Vascular: Normal flow voids. Skull and upper cervical spine: Large right facial mass erodes through the right maxilla including the floor of the maxillary sinus, the right half of the hard palate and the right maxillary alveolar process. Sinuses/Orbits: Erosive mass involving the right maxillary sinus. Otherwise, the paranasal sinuses are clear. There is abnormal enhancement along the right infraorbital nerve. Other: None IMPRESSION: 1. Large right  facial mass with right maxillary erosion involving the maxillary sinus, hard palate and pterygoid plates and muscles. 2. Perineural spread along multiple branches of the maxillary division of the right trigeminal nerve at the infraorbital canal, pterygopalatine fossa and foramen rotundum. Electronically Signed   By: KUlyses JarredM.D.   On: 07/17/2020 20:04   NM PET Image Initial (PI) Skull Base To Thigh  Result Date: 07/17/2020 CLINICAL DATA:  Initial treatment strategy for head and neck cancer. EXAM: NUCLEAR MEDICINE PET SKULL BASE TO THIGH TECHNIQUE: 7.45 mCi F-18 FDG was injected intravenously. Full-ring PET imaging was performed from the skull base to thigh after the radiotracer. CT data was obtained and used for attenuation correction and anatomic localization. Fasting blood glucose: 102 mg/dl COMPARISON:  CT maxillofacial with contrast 07/10/2020 FINDINGS: Mediastinal blood pool activity: SUV max 1.99 Liver activity: SUV max NA NECK: The previously characterized soft tissue mass involving the right maxilla, right buccal space, floor of right nasal cavity and right pterygoid palatine fossa, right orbital apex, right soft palate and right palatine tonsil is intensely FDG avid and has an SUV max of 13.74. The recently described suspicious, 7 mm right level 1a lymph node has an SUV max of 2.12. Incidental CT findings: none CHEST: No hypermetabolic mediastinal or hilar nodes. No suspicious pulmonary nodules on the CT scan. Incidental CT findings: Aortic atherosclerosis. ABDOMEN/PELVIS: No abnormal hypermetabolic activity within the liver, pancreas, adrenal glands, or spleen. No hypermetabolic lymph nodes in the abdomen or pelvis. Incidental CT findings: Cholecystectomy.  Aortic atherosclerosis. SKELETON: No signs of FDG avid distant osseous metastasis. Incidental CT findings: none IMPRESSION: 1. Intense FDG uptake is associated with the large enhancing tumor which involves the right maxilla, right nasal  cavity, right pterygoid palatine fossa, right orbital apex and right soft tissue palate and palatine tonsil. 2. Mild nonspecific FDG uptake is associated with the recently characterized suspicious right level 1A lymph node which measures 7 mm and has an SUV max of 2.12. 3. No signs of FDG avid distant metastatic disease. 4.  Aortic Atherosclerosis (ICD10-I70.0). Electronically Signed   By: TKerby MoorsM.D.   On: 07/17/2020 15:26   CT Maxillofacial W Contrast  Result Date: 07/10/2020 CLINICAL DATA:  64year old female with right side facial swelling and pain. Discolored areas of oral mucosa on that side. EXAM: CT MAXILLOFACIAL WITH CONTRAST TECHNIQUE: Multidetector CT imaging of the maxillofacial structures was performed with intravenous contrast. Multiplanar CT  image reconstructions were also generated. CONTRAST:  78m OMNIPAQUE IOHEXOL 300 MG/ML  SOLN COMPARISON:  CT head and CTA head and neck 06/15/2018. FINDINGS: Osseous: New since 2019, erosion throughout the floor of the right maxillary sinus, the right half of the hard palate (series 7, image 26) and the entire right maxillary alveolar process. The right maxillary sinus was clear in 2019, but now demonstrates a combination of low-density and enhancing soft tissue, presumably tumor, which infiltrates through the eroded floor of the right maxilla and to the right buccal space and oral cavity (series 6, image 31). The soft tissue abnormality extends throughout much of the floor of the right nasal cavity. The right pterygoid plates are also partially eroded. The abnormal enhancing soft tissue has indistinct margins but encompasses an area of roughly 5 cm (series 2, image 47, series 6, image 31). The right pterygoid palatine fossa is infiltrated (series 2, image 35). The posterior margin of tumor is in proximity to the right mandible on series 2, image 52, but the mandible and TMJ appear spared. The clivus and remaining central skull base also appear intact.  Right frontotemporal craniotomy changes are new since 2019. No other bone erosion or suspicious bone lesion identified. Orbits: The right orbital floor remains intact but is abutted by abnormal soft tissue in the right maxillary sinus. There may be tumor at the right orbital apex tracking cephalad from the pterygoid palatine fossa, but otherwise no mass or inflammation within the right orbit. The other orbital walls are intact. The left orbit also appears negative. Sinuses: Destruction of the right maxilla with abnormal enhancing and low-density masslike soft tissue within the sinus in involving the floor of the right nasal cavity as stated above. Elsewhere the nasal cavity and other paranasal sinuses seem to remain normal. Mild retained secretions and/or mucosal thickening suspected in the right sphenoid sinus. The tympanic cavities and mastoids are clear. Soft tissues: Bulky masslike soft tissue centered at the right maxillary alveolus and involving the right buccal space and oral cavity (series 2, image 55). Involvement of the anterior right pterygoid musculature. The remaining right masticator space appears spared. In addition to cephalad spread to the right pterygoid palatine fossa there appears to be contiguous tumor along the soft palate and right tonsillar pillar on series 2, image 48. The remaining pharyngeal contours are within normal limits. The right parapharyngeal and retropharyngeal spaces remain within normal limits. Central sublingual space, submandibular glands and parotid glands are within normal limits. Small but asymmetric 7 mm right level 2A lymph node on series 2, image 70. On image 69 in the left lateral sublingual space there is an indeterminate 7 mm enhancing soft tissue nodule. This seems too posterior for the left sublingual gland. Bilateral level 2 lymph nodes measure 7-9 mm short axis and appears symmetric. The visible major vascular structures in the upper neck appear patent. Limited  intracranial: The right cavernous sinus seems to remain patent. However, there is a subtle asymmetric enhancing soft tissue nodule measuring 4-5 mm at the right foramen rotundum and abutting the cavernous sinus on series 6, image 46. Definite intracranial 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 IMPRESSION: 1. Bulky and indistinct enhancing tumor which has aggressively destroyed the right maxilla, infiltrates along the right buccal space, floor of the right nasal cavity, into the right pterygoid palatine fossa, the right orbital apex, and also the right soft palate and palatine tonsil. Furthermore, there is evidence of intracranial extension along the right V2 at  the foramen rotundum. 2. Small but suspicious right level 1A lymph node measuring 7 mm. And there is a separate small indeterminate but suspicious enhancing soft tissue nodule of the left sublingual space. Electronically Signed   By: Genevie Ann M.D.   On: 07/10/2020 16:26    PERFORMANCE STATUS (ECOG) : 1 - Symptomatic but completely ambulatory  Review of Systems Unless otherwise noted, a complete review of systems is negative.  Physical Exam General: NAD Pulmonary: Unlabored Extremities: no edema, no joint deformities Skin: no rashes Neurological: Weakness but otherwise nonfocal  IMPRESSION: I met with patient.  I introduced palliative care services and attempted to establish therapeutic rapport.  Symptomatically, patient has had facial numbness and pain but feels overall better.  She says that she is taking Percocet a couple of times each day for breakthrough pain.  She is taking gabapentin once or twice each day.  Overall, she feels that regimen is sufficient at controlling her pain.  She saw ENT yesterday and underwent biopsy.  Biopsy results are pending.  Patient has concerns about financial insecurity.  She was referred to see Barnabas Lister, SW.  Patient reports being functionally independent with her own care.  She is  estranged from her family but feels like she has good social support from friends.  Patient is interested in completing advance directives.  She wants to appoint her roommate to be her healthcare power of attorney.  I sent her home with ACP documents and a MOST form to review.  Patient does not think that she would want aggressive or futile care at end-of-life.  PLAN: -Continue current scope of treatment -We will increase gabapentin 300 mg 3 times daily -Continue Percocet as needed for breakthrough pain -ACP/MOST form reviewed -Follow-up MyChart visit 2 to 3 weeks   Patient expressed understanding and was in agreement with this plan. She also understands that She can call the clinic at any time with any questions, concerns, or complaints.     Time Total: 30 minutes  Visit consisted of counseling and education dealing with the complex and emotionally intense issues of symptom management and palliative care in the setting of serious and potentially life-threatening illness.Greater than 50%  of this time was spent counseling and coordinating care related to the above assessment and plan.  Signed by: Altha Harm, PhD, NP-C

## 2020-07-19 ENCOUNTER — Telehealth: Payer: Self-pay | Admitting: *Deleted

## 2020-07-19 MED ORDER — GABAPENTIN 300 MG PO CAPS: 300.0000 mg | ORAL_CAPSULE | Freq: Three times a day (TID) | ORAL | 0 refills | Status: DC

## 2020-07-19 NOTE — Addendum Note (Signed)
Addended by: Irean Hong on: 07/19/2020 04:53 PM   Modules accepted: Orders

## 2020-07-19 NOTE — Telephone Encounter (Signed)
Gabapentin prescription needs to go to [facility] in [place], not [facility], Please resend

## 2020-07-19 NOTE — Progress Notes (Signed)
PSN met with patient, yesterday, at the clinic.  Discussed applying for Medicare B, Medicaid, and use of resources from the charitable funds.

## 2020-07-23 ENCOUNTER — Other Ambulatory Visit: Payer: Self-pay

## 2020-07-23 DIAGNOSIS — G893 Neoplasm related pain (acute) (chronic): Secondary | ICD-10-CM

## 2020-07-23 DIAGNOSIS — D490 Neoplasm of unspecified behavior of digestive system: Secondary | ICD-10-CM

## 2020-07-24 ENCOUNTER — Telehealth: Payer: Self-pay

## 2020-07-24 NOTE — Telephone Encounter (Signed)
Left message for patient to call office for an update.

## 2020-07-24 NOTE — Telephone Encounter (Addendum)
Left message---called patient to see how she is feeling and to assess if any pain.  ENT has not received results of biopsy obtained at their office.  She also needs to be informed of the appt with Dr. Baruch Gouty on 07/26/20 @ 10:30.

## 2020-07-25 ENCOUNTER — Other Ambulatory Visit: Payer: Self-pay

## 2020-07-25 ENCOUNTER — Encounter: Payer: Self-pay | Admitting: Radiation Oncology

## 2020-07-25 ENCOUNTER — Inpatient Hospital Stay: Payer: Self-pay

## 2020-07-25 ENCOUNTER — Ambulatory Visit
Admission: RE | Admit: 2020-07-25 | Discharge: 2020-07-25 | Disposition: A | Discharge status: Home / Self Care | Payer: Self-pay | Source: Ambulatory Visit | Attending: Radiation Oncology | Admitting: Radiation Oncology

## 2020-07-25 VITALS — BP 180/82 | HR 63 | Temp 96.2°F | Wt 132.0 lb

## 2020-07-25 DIAGNOSIS — C760 Malignant neoplasm of head, face and neck: Secondary | ICD-10-CM

## 2020-07-25 NOTE — Telephone Encounter (Signed)
Patient is being seem by Dr. Baruch Gouty today.

## 2020-07-25 NOTE — Consult Note (Signed)
NEW PATIENT EVALUATION  Name: Shannon Schroeder  MRN: [id number]  Date:   07/25/2020     DOB: 08-17-1956   This 64 y.o. 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  REFERRING PHYSICIAN: Earlie Server, MD  CHIEF COMPLAINT:  Chief Complaint  Patient presents with  . Consult    DIAGNOSIS: There were no encounter diagnoses.   PREVIOUS INVESTIGATIONS:  PET/CT and CT scans reviewed Pathology pending Clinical notes reviewed  HPI: Patient is a 64 year old female who presented with right-sided facial swelling and pain in the right side of her mouth for the past several months.  She was found on oral examination to have a mass eroding in the area of the right maxilla.  CT scan showed a bulky indistinct enhancing tumor aggressive destroying the maxilla of the right infiltrating around the right buccal space floor of the right nasal cavity into the pterygoid palatine fossa.  The right orbital apex and right soft palate and palate and tonsillar also involved.  She also had small but suspicious right level 1A lymph nodes measuring 7 mm.  PET CT scan showed intense hypermetabolic to be associated with this mass again involving the right maxilla right nasal cavity right pterygopalatine fossa right orbital apex and right soft tissue palate and palate teen tonsil.  She also mild nonspecific uptake in the suspicious right level 1A lymph node.  Patient is undergone biopsy by Drjuengle with pathology pending and being reviewed at [facility].  She is seen today for consultation.  She continues to have narcotic dependent pain in the area of the right face as well as right facial swelling she is having some mild dysphagia.  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    PAST SURGICAL HISTORY:  Past Surgical History:  Procedure Laterality Date  . ABDOMINAL HYSTERECTOMY     partial  . BREAST SURGERY     biopsy  . CARDIAC CATHETERIZATION     X 2  . CARPECTOMY HAND Right   . CATARACT EXTRACTION W/PHACO Left 03/08/2018   Procedure: CATARACT EXTRACTION PHACO AND INTRAOCULAR LENS PLACEMENT (IOC);  Surgeon: Birder Robson, MD;  Location: [facility] ORS;  Service: Ophthalmology;  Laterality: Left;  [place] 00:55 AP% 13.5 CDE 7.52 Fluid pack lot # [id number]  . CATARACT EXTRACTION W/PHACO Right 04/05/2018   Procedure: CATARACT EXTRACTION PHACO AND INTRAOCULAR LENS PLACEMENT (IOC);  Surgeon: Birder Robson, MD;  Location: [facility] ORS;  Service: Ophthalmology;  Laterality: Right;  [place] 00:36 AP% 15.9 CDE 5.72 Fluid pack lot # [id number]  . CHOLECYSTECTOMY    . COLONOSCOPY WITH PROPOFOL N/A 04/26/2019   Procedure: COLONOSCOPY WITH PROPOFOL;  Surgeon: Jonathon Bellows, MD;  Location: [facility] ENDOSCOPY;  Service: Gastroenterology;  Laterality: N/A;  . TUBAL LIGATION      FAMILY HISTORY: family history includes Emphysema in her maternal aunt; Heart failure in her mother; Hypertension in her brother, maternal grandmother, mother, paternal aunt, paternal grandmother, paternal uncle, and son; Stroke in her brother.  SOCIAL HISTORY:  reports that she has been smoking cigarettes. She has a 50.00 pack-year smoking history. She has never used smokeless tobacco. She reports that she does not drink alcohol and does not use drugs.  ALLERGIES: Fluoxetine, Aspirin, Belladonna alkaloids, Fluoxetine hcl, Naproxen, Paroxetine hcl, Phenobarbital, and Prozac [fluoxetine  hcl]  MEDICATIONS:  Current Outpatient Medications  Medication Sig Dispense Refill  . Acetaminophen (TYLENOL 8 HOUR ARTHRITIS PAIN PO) Take 3 tablets by mouth daily as needed.    . ALPRAZolam (XANAX) 0.5 MG tablet Take 1 tablet (0.5 mg total) by mouth See admin  instructions. Take 30-60 minutes prior to the MRI. If needed, repeat another 0.5mg  dose after 30-60 minutes of first dose. 2 tablet 0  . b complex vitamins capsule Take 1 capsule by mouth daily as needed.    . gabapentin (NEURONTIN) 300 MG capsule Take 1 capsule (300 mg total) by mouth 3 (three) times daily. 90 capsule 0  . Ginkgo Biloba (GINKOBA PO) Take 1 tablet by mouth daily as needed.    Marland Kitchen ibuprofen (ADVIL) 400 MG tablet Take 400 mg by mouth every 6 (six) hours as needed for mild pain or moderate pain.    . Multiple Vitamins-Minerals (CENTRUM SILVER PO) Take 1 tablet by mouth daily as needed.    Marland Kitchen oxyCODONE-acetaminophen (PERCOCET) 5-325 MG tablet Take 1 tablet by mouth every 4 (four) hours as needed for severe pain. 30 tablet 0  . Potassium 99 MG TABS Take 1 tablet by mouth daily as needed.    . [place] JOHNS WORT PO Take by mouth daily as needed.    Marland Kitchen amLODipine (NORVASC) 5 MG tablet Take 1 tablet (5 mg total) by mouth daily. (Patient not taking: Reported on 07/12/2020) 90 tablet 1  . atenolol (TENORMIN) 25 MG tablet Take 1 tablet (25 mg total) by mouth 2 (two) times daily. (Patient not taking: Reported on 07/12/2020) 180 tablet 3  . lisinopril (ZESTRIL) 20 MG tablet Take 1 tablet (20 mg total) by mouth daily. (Patient not taking: Reported on 07/12/2020) 90 tablet 1  . Melatonin 10 MG TABS Take 10 mg by mouth at bedtime as needed.     . VENTOLIN HFA 108 (90 Base) MCG/ACT inhaler INHALE 1 TO 2 PUFFS EVERY 6 HOURS AS NEEDED FOR WHEEZING OR SHORTNESSOF BREATH (Patient not taking: Reported on 09/12/2019) 18 g 0   No current facility-administered medications for this encounter.    ECOG PERFORMANCE STATUS:  1 - Symptomatic but completely ambulatory  REVIEW OF SYSTEMS: Patient does have significant right facial swelling as well as right facial pain with mild dysphagia Patient denies any weight loss, fatigue, weakness, fever, chills or night sweats. Patient denies any loss of vision, blurred vision.  Patient denies any ringing  of the ears or hearing loss. No irregular heartbeat. Patient denies heart murmur or history of fainting. Patient denies any chest pain or pain radiating to her upper extremities. Patient denies any shortness of breath, difficulty breathing at night, cough or hemoptysis. Patient denies any swelling in the lower legs. Patient denies any nausea vomiting, vomiting of blood, or coffee ground material in the vomitus. Patient denies any stomach pain. Patient states has had normal bowel movements no significant constipation or diarrhea. Patient denies any dysuria, hematuria or significant nocturia. Patient denies any problems walking, swelling in the joints or loss of balance. Patient denies any skin changes, loss of hair or loss of weight. Patient denies any excessive worrying or anxiety or significant depression. Patient denies any problems with insomnia. Patient denies excessive thirst, polyuria, polydipsia. Patient denies any swollen glands, patient denies easy bruising or easy bleeding. Patient denies any recent infections, allergies or URI. Patient "s visual fields have not changed significantly in recent time.   PHYSICAL EXAM: BP (!) 180/82   Pulse 63  Temp (!) 96.2 F (35.7 C) (Tympanic)   Wt 132 lb (59.9 kg)   LMP 11/19/1988 (Exact Date)   SpO2 99% Comment: room air  BMI 21.31 kg/m  Oral cavity shows ulcerated mass in the area of the right maxilla invading into the posterior pharynx.  No evidence of adenopathy on physical examination this is detected bilaterally in her neck.  Well-developed well-nourished patient in NAD. HEENT reveals PERLA, EOMI, discs not visualized.  Oral cavity is clear. No oral mucosal lesions are identified. Neck is clear without evidence of cervical or supraclavicular adenopathy. Lungs are clear to A&P. Cardiac examination is essentially unremarkable with regular rate and rhythm without murmur rub or thrill. Abdomen is benign with no organomegaly or  masses noted. Motor sensory and DTR levels are equal and symmetric in the upper and lower extremities. Cranial nerves II through XII are grossly intact. Proprioception is intact. No peripheral adenopathy or edema is identified. No motor or sensory levels are noted. Crude visual fields are within normal range.  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  Patient is being sent back to Dr. Tasia Catchings for consideration of concurrent chemoradiation.  I would plan on delivering 70 Gray in 7 weeks using IMRT treatment planning and delivery.  I would use IMRT to spare critical structures such as her orbit spinal cord base of skull and salivary glands.  Risks and benefits of treatment including possible oral mucositis fatigue alteration of taste xerostomia skin reaction all were discussed in detail with the patient.  I personally set up and ordered CT simulation for next week we use PET fusion study to delineate areas of tumor involvement.  I also will treat her right neck up to 5400 centigrade using IMRT dose painting technique.  CT simulation was ordered patient comprehends my recommendations well.  I would like to take this opportunity to thank you for allowing me to participate in the care of your patient.Noreene Filbert, MD

## 2020-07-25 NOTE — Telephone Encounter (Signed)
Contacted ENT today and spoke to [place], who said that specimen was sent to [facility] therefore path results take a bit longer to complete. Provided Fax number for her to fax results to [place] once it is completed.

## 2020-07-25 NOTE — Progress Notes (Signed)
Nutrition Assessment   Reason for Assessment:  Weight loss   ASSESSMENT:  64 year old female with tumor of right maxilla infiltrates right buccal, floor of nasal cavity, right pterygoid palatine fossa, the right orbital apex and right soft palate, and palatine tonsil.  Waiting on biopsy results. Meeting with radiation oncologist today.   Met with patient in clinic.  Patient reports that appetite has been decreased for a couple of months.  Reports weight loss since March 2021.  Reports that she is drinking ensure/boost 1-2 times per day.  Intake varies daily based on work schedule.  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  Takes fruit snacks, cheese, peanut butter, pretzels and dip, shakes for work.  Yesterday ate cheese grits and eggs.  Often eats [employer] Complete meals, microwave macaroni and cheese.  Reports throat pain and pain with chewing.  Does not stop her from eating.     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  Handout provided. Discussed choosing shakes with at least 350 calories or more.  Increase to TID if able to tolerate Discussed importance of protein foods and provided handout on soft moist protein foods.  Contact information provided Samples of oral nutrition supplement shakes provided (ensure complete, boost plus, orgain, [employer] farms)   MONITORING, EVALUATION, GOAL:  weight trends, intake, poc   Next Visit: October 14, phone f/u  Shermaine Brigham B. Zenia Resides, [place], [place] Registered Dietitian [phone] (mobile)

## 2020-07-26 ENCOUNTER — Ambulatory Visit: Payer: Self-pay | Admitting: Radiation Oncology

## 2020-07-29 ENCOUNTER — Other Ambulatory Visit: Payer: Self-pay

## 2020-07-29 ENCOUNTER — Ambulatory Visit
Admission: RE | Admit: 2020-07-29 | Discharge: 2020-07-29 | Disposition: A | Discharge status: Home / Self Care | Payer: Self-pay | Source: Ambulatory Visit | Attending: Radiation Oncology | Admitting: Radiation Oncology

## 2020-07-29 ENCOUNTER — Inpatient Hospital Stay (HOSPITAL_BASED_OUTPATIENT_CLINIC_OR_DEPARTMENT_OTHER): Payer: Self-pay | Admitting: Oncology

## 2020-07-29 ENCOUNTER — Encounter: Payer: Self-pay | Admitting: Oncology

## 2020-07-29 VITALS — BP 179/93 | HR 64 | Temp 97.3°F | Resp 18 | Wt 131.0 lb

## 2020-07-29 DIAGNOSIS — G893 Neoplasm related pain (acute) (chronic): Secondary | ICD-10-CM | POA: Insufficient documentation

## 2020-07-29 DIAGNOSIS — Z510 Encounter for antineoplastic radiation therapy: Secondary | ICD-10-CM | POA: Insufficient documentation

## 2020-07-29 DIAGNOSIS — C310 Malignant neoplasm of maxillary sinus: Secondary | ICD-10-CM

## 2020-07-29 DIAGNOSIS — R634 Abnormal weight loss: Secondary | ICD-10-CM | POA: Insufficient documentation

## 2020-07-29 DIAGNOSIS — Z7189 Other specified counseling: Secondary | ICD-10-CM

## 2020-07-29 DIAGNOSIS — I10 Essential (primary) hypertension: Secondary | ICD-10-CM

## 2020-07-29 DIAGNOSIS — C760 Malignant neoplasm of head, face and neck: Secondary | ICD-10-CM

## 2020-07-29 HISTORY — DX: Malignant neoplasm of head, face and neck: C76.0

## 2020-07-29 MED ORDER — LIDOCAINE-PRILOCAINE 2.5-2.5 % EX CREA: TOPICAL_CREAM | CUTANEOUS | 3 refills | Status: DC

## 2020-07-29 MED ORDER — FENTANYL 25 MCG/HR TD PT72: 1.0000 | MEDICATED_PATCH | TRANSDERMAL | 0 refills | Status: DC

## 2020-07-29 MED ORDER — PROCHLORPERAZINE MALEATE 10 MG PO TABS: 10.0000 mg | ORAL_TABLET | Freq: Four times a day (QID) | ORAL | 1 refills | Status: DC | PRN

## 2020-07-29 MED ORDER — LISINOPRIL 20 MG PO TABS: 20.0000 mg | ORAL_TABLET | Freq: Every day | ORAL | 0 refills | Status: DC

## 2020-07-29 NOTE — Progress Notes (Signed)
Pt here for follow up. Pt reports increasing right face pain. Reports current pain at 6/10 and tolerable.

## 2020-07-29 NOTE — Progress Notes (Signed)
DISCONTINUE ON PATHWAY REGIMEN - Head and Neck     A cycle is every 21 days:     Cisplatin   **Always confirm dose/schedule in your pharmacy ordering system**  REASON: Other Reason PRIOR TREATMENT: [id number]: Cisplatin 100 mg/m2 q21 Days x 3 Cycles with Concurrent Radiation TREATMENT RESPONSE: Unable to Evaluate  START ON PATHWAY REGIMEN - Head and Neck     A cycle is every 7 days:     Cisplatin   **Always confirm dose/schedule in your pharmacy ordering system**  Patient Characteristics: Other Sites, Preoperative or Nonsurgical Candidate, Stage III - IVB Disease Classification: Other Sites AJCC 8 Stage Grouping: IVB Therapeutic Status: Preoperative or Nonsurgical Candidate Intent of Therapy: Curative Intent, Discussed with Patient

## 2020-07-29 NOTE — Progress Notes (Signed)
Hematology/Oncology follow up note [facility] Telephone:(336) [phone] [phone]) [phone]   Patient Care Team: Patient, No Pcp Per as PCP - General (General Practice) Earlie Server, MD as Consulting Physician (Oncology)  REFERRING PROVIDER: ER physician Dr. Laurel Dimmer COMPLAINTS/REASON FOR VISIT:  Follow-up for head and neck cancer  HISTORY OF PRESENTING ILLNESS:   Shannon Schroeder is a  64 y.o.  female with PMH listed below was seen in consultation at the request of ER Dr. [name] for evaluation of abnormal CT scan.  07/10/2020 she presented emergency room for evaluation of right-sided facial swelling and pain in the right side of her mouth for the past 6 weeks.  It started in her mouth with a small ulcer which progressively got worse.  She wears denture.  Not able to chew well.  She eats soft food. She was found to have right upper gum also with yellowish discharge noted. 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  Avoid piloting.,  Right orbital apex, also the right soft palate and palate and tonsil.  Furthermore there is evidence of intracranial extension along the right V2 at the foramen rotundum Small suspicious right level 1A lymph node measuring 7 mm.  Separate small indeterminate but suspicious enhancing soft tissue nodule of the left sublingual space.  Patient was referred to oncology for further evaluation management. Patient denies fever, chills, nausea vomiting diarrhea shortness of breath or cough.  Reports 10 out of 10 severe pain of right face and numbness.  She also has headache. Reports unintentional weight loss  She reports no contactable family members.  She is in the process of pointing her roommate [name] to be her power of attorney.  She also wants to record today's conversation for her roommate  [name]. Patient has 50-pack-year smoking history.  INTERVAL HISTORY Shannon Schroeder is a 64 y.o. female who has above history reviewed by me today presents for follow up visit for management of head and neck cancer. Problems and complaints are listed below: During interval patient has been seen by ENT Dr.Juengel and had a biopsy. Biopsy was sent to [facility] and the final pathology was positive for invasive squamous cell carcinoma, well-differentiated, keratinizing. 07/17/2020, brain MRI showed large right facial mass with right maxillary erosion involving the maxillary sinus, hard palate and pterygoid plates and muscles.  Perineural spreading along the multiple branches of the maxillary division of the right trigeminal nerve at the infraorbital canal, pterygopalatine fossa and foramen rotundum. 07/17/2020, PET scan showed Intense FDG uptake is associated with the large enhancing tumor which involves the right maxilla, right nasal cavity, right pterygoid palatine fossa, right orbital apex and right soft tissue palate and palatine tonsil. 2. Mild nonspecific FDG uptake is associated with the recently characterized suspicious right level 1A lymph node which measures 7 mm and has an SUV max of 2.12.  No signs of distant metastasis.  Aortic atherosclerosis.  Patient also reports a history of hypertension, dysrhythmia, history of V. tach.  Patient previously was on lisinopril, atenolol, amlodipine for which she is no longer taking at this point due to lack of medication coverage.  She also does not have any primary care provider. 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  On average she says she is takes 2 to 3 pills/day.  She also takes gabapentin 300 mg 3 times daily    Review of Systems  Constitutional: Positive for appetite  change, fatigue and unexpected weight change. Negative for chills and fever.  HENT:   Positive for mouth sores. Negative for hearing loss and voice change.        Facial swelling, pain and numbness  Eyes: Negative for eye problems.  Respiratory: Negative for chest tightness and cough.   Cardiovascular: Negative for chest pain.  Gastrointestinal: Negative for abdominal distention, abdominal pain and blood in stool.  Endocrine: Negative for hot flashes.  Genitourinary: Negative for difficulty urinating and frequency.   Musculoskeletal: Negative for arthralgias.  Skin: Negative for itching and rash.  Neurological: Positive for headaches. Negative for extremity weakness.  Hematological: Negative for adenopathy.  Psychiatric/Behavioral: Negative for confusion.    MEDICAL HISTORY:  Past Medical History:  Diagnosis Date  . 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   Marland Kitchen Anxiety   . Arthritis   . Bipolar disorder ([place])   . COPD (chronic obstructive pulmonary disease) ([place])   . Depression   . Dysrhythmia    H/O V TACH  . Hypertension     SURGICAL HISTORY: Past Surgical History:  Procedure Laterality Date  . ABDOMINAL HYSTERECTOMY     partial  . BREAST SURGERY     biopsy  . CARDIAC CATHETERIZATION     X 2  . CARPECTOMY HAND Right   . CATARACT EXTRACTION W/PHACO Left 03/08/2018   Procedure: CATARACT EXTRACTION PHACO AND INTRAOCULAR LENS PLACEMENT (IOC);  Surgeon: Birder Robson, MD;  Location: [facility] ORS;  Service: Ophthalmology;  Laterality: Left;  [place] 00:55 AP% 13.5 CDE 7.52 Fluid pack lot # [id number]  . CATARACT EXTRACTION W/PHACO Right 04/05/2018   Procedure: CATARACT EXTRACTION PHACO AND INTRAOCULAR LENS PLACEMENT (IOC);  Surgeon: Birder Robson, MD;  Location: [facility] ORS;  Service: Ophthalmology;  Laterality: Right;  [place] 00:36 AP% 15.9 CDE 5.72 Fluid pack lot # [id number]  . CHOLECYSTECTOMY    . COLONOSCOPY WITH PROPOFOL N/A 04/26/2019    Procedure: COLONOSCOPY WITH PROPOFOL;  Surgeon: Jonathon Bellows, MD;  Location: [facility] ENDOSCOPY;  Service: Gastroenterology;  Laterality: N/A;  . TUBAL LIGATION      SOCIAL HISTORY: Social History   Socioeconomic History  . Marital status: Divorced    Spouse name: Not on file  . Number of children: Not on file  . Years of education: 91  . Highest education level: Bachelor's degree (e.g., BA, AB, BS)  Occupational History  . Occupation: farmer  Tobacco Use  . Smoking status: Current Every Day Smoker    Packs/day: 1.00    Years: 50.00    Pack years: 50.00    Types: Cigarettes  . Smokeless tobacco: Never Used  Vaping Use  . Vaping Use: Never used  Substance and Sexual Activity  . Alcohol use: No  . Drug use: No  . Sexual activity: Not Currently  Other Topics Concern  . Not on file  Social History Narrative   Pt [facility] at [facility].    Social Determinants of Health   Financial Resource Strain:   . Difficulty of Paying Living Expenses: Not on file  Food Insecurity:   . Worried About Charity fundraiser in the Last Year: Not on file  . Ran Out of Food in the Last Year: Not on  file  Transportation Needs:   . Film/video editor (Medical): Not on file  . Lack of Transportation (Non-Medical): Not on file  Physical Activity:   . Days of Exercise per Week: Not on file  . Minutes of Exercise per Session: Not on file  Stress:   . Feeling of Stress : Not on file  Social Connections:   . Frequency of Communication with Friends and Family: Not on file  . Frequency of Social Gatherings with Friends and Family: Not on file  . Attends Religious Services: Not on file  . Active Member of Clubs or Organizations: Not on file  . Attends Archivist Meetings: Not on file  . Marital Status: Not on file  Intimate Partner Violence:   . Fear of Current or Ex-Partner: Not on file  . Emotionally Abused: Not on file  . Physically Abused: Not on file  . Sexually Abused: Not  on file    FAMILY HISTORY: Family History  Problem Relation Age of Onset  . Hypertension Mother   . Heart failure Mother   . Hypertension Brother   . Stroke Brother   . Hypertension Son   . Emphysema Maternal Aunt   . Hypertension Paternal Aunt   . Hypertension Paternal Uncle   . Hypertension Maternal Grandmother   . Hypertension Paternal Grandmother     ALLERGIES:  is allergic to fluoxetine, aspirin, belladonna alkaloids, fluoxetine hcl, naproxen, paroxetine hcl, phenobarbital, and prozac [fluoxetine hcl].  MEDICATIONS:  Current Outpatient Medications  Medication Sig Dispense Refill  . Acetaminophen (TYLENOL 8 HOUR ARTHRITIS PAIN PO) Take 3 tablets by mouth daily as needed.    . ALPRAZolam (XANAX) 0.5 MG tablet Take 1 tablet (0.5 mg total) by mouth See admin instructions. Take 30-60 minutes prior to the MRI. If needed, repeat another 0.5mg  dose after 30-60 minutes of first dose. 2 tablet 0  . amLODipine (NORVASC) 5 MG tablet Take 1 tablet (5 mg total) by mouth daily. (Patient not taking: Reported on 07/12/2020) 90 tablet 1  . atenolol (TENORMIN) 25 MG tablet Take 1 tablet (25 mg total) by mouth 2 (two) times daily. (Patient not taking: Reported on 07/12/2020) 180 tablet 3  . b complex vitamins capsule Take 1 capsule by mouth daily as needed.    . gabapentin (NEURONTIN) 300 MG capsule Take 1 capsule (300 mg total) by mouth 3 (three) times daily. 90 capsule 0  . Ginkgo Biloba (GINKOBA PO) Take 1 tablet by mouth daily as needed.    Marland Kitchen ibuprofen (ADVIL) 400 MG tablet Take 400 mg by mouth every 6 (six) hours as needed for mild pain or moderate pain.    Marland Kitchen lisinopril (ZESTRIL) 20 MG tablet Take 1 tablet (20 mg total) by mouth daily. (Patient not taking: Reported on 07/12/2020) 90 tablet 1  . Melatonin 10 MG TABS Take 10 mg by mouth at bedtime as needed.     . Multiple Vitamins-Minerals (CENTRUM SILVER PO) Take 1 tablet by mouth daily as needed.    Marland Kitchen oxyCODONE-acetaminophen (PERCOCET) 5-325 MG  tablet Take 1 tablet by mouth every 4 (four) hours as needed for severe pain. 30 tablet 0  . Potassium 99 MG TABS Take 1 tablet by mouth daily as needed.    . [place] JOH[place] WORT PO Take by mouth daily as needed.    . VENTOLIN HFA 108 (90 Base) MCG/ACT inhaler INHALE 1 TO 2 PUFFS EVERY 6 HOURS AS NEEDED FOR WHEEZING OR SHORTNESSOF BREATH (Patient not taking: Reported on 09/12/2019)  18 g 0   No current facility-administered medications for this visit.     PHYSICAL EXAMINATION: ECOG PERFORMANCE STATUS: 1 - Symptomatic but completely ambulatory Vitals:   07/29/20 0915  BP: (!) 179/93  Pulse: 64  Resp: 18  Temp: (!) 97.3 F (36.3 C)   There were no vitals filed for this visit.  Physical Exam Constitutional:      General: She is not in acute distress. HENT:     Head: Normocephalic and atraumatic.  Eyes:     General: No scleral icterus. Cardiovascular:     Rate and Rhythm: Normal rate and regular rhythm.     Heart sounds: Normal heart sounds.  Pulmonary:     Effort: Pulmonary effort is normal. No respiratory distress.     Breath sounds: No wheezing.  Abdominal:     General: Bowel sounds are normal. There is no distension.     Palpations: Abdomen is soft.  Musculoskeletal:        General: No deformity. Normal range of motion.     Cervical back: Normal range of motion and neck supple.  Skin:    General: Skin is warm and dry.     Findings: No erythema or rash.  Neurological:     Mental Status: She is alert and oriented to person, place, and time. Mental status is at baseline.     Cranial Nerves: No cranial nerve deficit.     Coordination: Coordination normal.  Psychiatric:        Mood and Affect: Mood normal.     LABORATORY DATA:  I have reviewed the data as listed Lab Results  Component Value Date   WBC 14.1 (H) 07/10/2020   HGB 12.0 07/10/2020   HCT 35.9 (L) 07/10/2020   MCV 100.6 (H) 07/10/2020   PLT 383 07/10/2020   Recent Labs    07/10/20 1311 07/12/20 1204  NA  136 137  K 2.9* 4.0  CL 97* 98  CO2 28 27  GLUCOSE 100* 114*  BUN 13 8  CREATININE 0.43* 0.42*  CALCIUM 8.9 9.0  GFRNONAA >60 >60  GFRAA >60 >60  PROT 7.0  --   ALBUMIN 3.7  --   AST 20  --   ALT 16  --   ALKPHOS 106  --   BILITOT 0.5  --    Iron/TIBC/Ferritin/ %Sat    Component Value Date/Time   IRON 37 05/03/2018 1826   FERRITIN 52 05/03/2018 1826      RADIOGRAPHIC STUDIES: I have personally reviewed the radiological images as listed and agreed with the findings in the report. MR Brain W Wo Contrast  Result Date: 07/17/2020 CLINICAL DATA:  Right-sided facial pain for 4-5 months. Facial tumor. Possible perineural spread. EXAM: MRI HEAD WITHOUT AND WITH CONTRAST TECHNIQUE: Multiplanar, multiecho pulse sequences of the brain and surrounding structures were obtained without and with intravenous contrast. CONTRAST:  62mL GADAVIST GADOBUTROL 1 MMOL/ML IV SOLN COMPARISON:  Maxillofacial CT 07/10/2020 FINDINGS: Brain: No acute infarct, acute hemorrhage or extra-axial collection. Multifocal hyperintense T2-weighted signal within the white matter. Normal volume of CSF spaces. No chronic microhemorrhage. Normal midline structures. There is abnormal thickening and contrast enhancement of the maxillary branch of the right trigeminal nerve within the right Meckel cave and foramen rotundum. Abnormal enhancement extends to the pterygopalatine fossa and along the lateral posterior aspect of the right maxillary sinus. There is also abnormal enhancement extending into the right pterygoid muscles. Vascular: Normal flow voids. Skull and upper cervical spine: Large  right facial mass erodes through the right maxilla including the floor of the maxillary sinus, the right half of the hard palate and the right maxillary alveolar process. Sinuses/Orbits: Erosive mass involving the right maxillary sinus. Otherwise, the paranasal sinuses are clear. There is abnormal enhancement along the right infraorbital nerve.  Other: None IMPRESSION: 1. Large right facial mass with right maxillary erosion involving the maxillary sinus, hard palate and pterygoid plates and muscles. 2. Perineural spread along multiple branches of the maxillary division of the right trigeminal nerve at the infraorbital canal, pterygopalatine fossa and foramen rotundum. Electronically Signed   By: Ulyses Jarred M.D.   On: 07/17/2020 20:04   NM PET Image Initial (PI) Skull Base To Thigh  Result Date: 07/17/2020 CLINICAL DATA:  Initial treatment strategy for head and neck cancer. EXAM: NUCLEAR MEDICINE PET SKULL BASE TO THIGH TECHNIQUE: 7.45 mCi F-18 FDG was injected intravenously. Full-ring PET imaging was performed from the skull base to thigh after the radiotracer. CT data was obtained and used for attenuation correction and anatomic localization. Fasting blood glucose: 102 mg/dl COMPARISON:  CT maxillofacial with contrast 07/10/2020 FINDINGS: Mediastinal blood pool activity: SUV max 1.99 Liver activity: SUV max NA NECK: The previously characterized soft tissue mass involving the right maxilla, right buccal space, floor of right nasal cavity and right pterygoid palatine fossa, right orbital apex, right soft palate and right palatine tonsil is intensely FDG avid and has an SUV max of 13.74. The recently described suspicious, 7 mm right level 1a lymph node has an SUV max of 2.12. Incidental CT findings: none CHEST: No hypermetabolic mediastinal or hilar nodes. No suspicious pulmonary nodules on the CT scan. Incidental CT findings: Aortic atherosclerosis. ABDOMEN/PELVIS: No abnormal hypermetabolic activity within the liver, pancreas, adrenal glands, or spleen. No hypermetabolic lymph nodes in the abdomen or pelvis. Incidental CT findings: Cholecystectomy.  Aortic atherosclerosis. SKELETON: No signs of FDG avid distant osseous metastasis. Incidental CT findings: none IMPRESSION: 1. Intense FDG uptake is associated with the large enhancing tumor which  involves the right maxilla, right nasal cavity, right pterygoid palatine fossa, right orbital apex and right soft tissue palate and palatine tonsil. 2. Mild nonspecific FDG uptake is associated with the recently characterized suspicious right level 1A lymph node which measures 7 mm and has an SUV max of 2.12. 3. No signs of FDG avid distant metastatic disease. 4.  Aortic Atherosclerosis (ICD10-I70.0). Electronically Signed   By: Kerby Moors M.D.   On: 07/17/2020 15:26   CT Maxillofacial W Contrast  Result Date: 07/10/2020 CLINICAL DATA:  64 year old female with right side facial swelling and pain. Discolored areas of oral mucosa on that side. EXAM: CT MAXILLOFACIAL WITH CONTRAST TECHNIQUE: Multidetector CT imaging of the maxillofacial structures was performed with intravenous contrast. Multiplanar CT image reconstructions were also generated. CONTRAST:  46mL OMNIPAQUE IOHEXOL 300 MG/ML  SOLN COMPARISON:  CT head and CTA head and neck 06/15/2018. FINDINGS: Osseous: New since 2019, erosion throughout the floor of the right maxillary sinus, the right half of the hard palate (series 7, image 26) and the entire right maxillary alveolar process. The right maxillary sinus was clear in 2019, but now demonstrates a combination of low-density and enhancing soft tissue, presumably tumor, which infiltrates through the eroded floor of the right maxilla and to the right buccal space and oral cavity (series 6, image 31). The soft tissue abnormality extends throughout much of the floor of the right nasal cavity. The right pterygoid plates are also partially eroded. The  abnormal enhancing soft tissue has indistinct margins but encompasses an area of roughly 5 cm (series 2, image 47, series 6, image 31). The right pterygoid palatine fossa is infiltrated (series 2, image 35). The posterior margin of tumor is in proximity to the right mandible on series 2, image 52, but the mandible and TMJ appear spared. The clivus and remaining  central skull base also appear intact. Right frontotemporal craniotomy changes are new since 2019. No other bone erosion or suspicious bone lesion identified. Orbits: The right orbital floor remains intact but is abutted by abnormal soft tissue in the right maxillary sinus. There may be tumor at the right orbital apex tracking cephalad from the pterygoid palatine fossa, but otherwise no mass or inflammation within the right orbit. The other orbital walls are intact. The left orbit also appears negative. Sinuses: Destruction of the right maxilla with abnormal enhancing and low-density masslike soft tissue within the sinus in involving the floor of the right nasal cavity as stated above. Elsewhere the nasal cavity and other paranasal sinuses seem to remain normal. Mild retained secretions and/or mucosal thickening suspected in the right sphenoid sinus. The tympanic cavities and mastoids are clear. Soft tissues: Bulky masslike soft tissue centered at the right maxillary alveolus and involving the right buccal space and oral cavity (series 2, image 55). Involvement of the anterior right pterygoid musculature. The remaining right masticator space appears spared. In addition to cephalad spread to the right pterygoid palatine fossa there appears to be contiguous tumor along the soft palate and right tonsillar pillar on series 2, image 48. The remaining pharyngeal contours are within normal limits. The right parapharyngeal and retropharyngeal spaces remain within normal limits. Central sublingual space, submandibular glands and parotid glands are within normal limits. Small but asymmetric 7 mm right level 2A lymph node on series 2, image 70. On image 69 in the left lateral sublingual space there is an indeterminate 7 mm enhancing soft tissue nodule. This seems too posterior for the left sublingual gland. Bilateral level 2 lymph nodes measure 7-9 mm short axis and appears symmetric. The visible major vascular structures in  the upper neck appear patent. Limited intracranial: The right cavernous sinus seems to remain patent. However, there is a subtle asymmetric enhancing soft tissue nodule measuring 4-5 mm at the right foramen rotundum and abutting the cavernous sinus on series 6, image 46. Definite intracranial 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 IMPRESSION: 1. Bulky and indistinct enhancing tumor which has aggressively destroyed the right maxilla, infiltrates along the right buccal space, floor of the right nasal cavity, into the right pterygoid palatine fossa, the right orbital apex, and also the right soft palate and palatine tonsil. Furthermore, there is evidence of intracranial extension along the right V2 at the foramen rotundum. 2. Small but suspicious right level 1A lymph node measuring 7 mm. And there is a separate small indeterminate but suspicious enhancing soft tissue nodule of the left sublingual space. Electronically Signed   By: Genevie Ann M.D.   On: 07/10/2020 16:26      ASSESSMENT & PLAN:  1. 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 Neoplasm related pain   3. Weight loss   4. Essential hypertension   5. 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  Likely not resectable, I will double check with ENT.  Recommend concurrent chemotherapy weekly cisplatin and radiation. She has seen Dr.Chrystal already and plan to start around 08/08/2020.  The diagnosis and care plan were discussed with patient in detail.  NCCN guidelines were reviewed and shared with patient.   The goal of treatment is with curative intent.  Chemotherapy education was provided.  We had discussed the composition of chemotherapy regimen, length of chemo cycle, duration of treatment and the time to assess response to treatment.    I explained to the patient the risks and benefits of chemotherapy including all but not limited to hair loss, mouth sore,hearing loss, nausea,  vomiting, diarrhea, low blood counts, bleeding, neuropathy and risk of life threatening infection and even death, secondary malignancy etc.  Patient voices understanding and willing to proceed chemotherapy.  Baseline hearing testing   # Chemotherapy education;medi port option is discussed and she prefers to have medi port. refer to Dr.Cintron to [facility]- port placement. Antiemetics-; EMLA cream sent to pharmacy  #neoplasm induced pain,  Continue percocet q4 hours as needed for pain. Add fentanyl patch 59mcg/h Q72 hours. .   Patient verbalized understanding that medications should not be sold or shared, taken with alcohol, or used while driving. Patient educated that medications should not be bitten, chewed, or crushed. patient has been made aware of the sside effects and treatment/ prevention  of constipation,  respiratory depression,  and mental status changes, even lead to overdose that may result in death, if used outside of the parameters that we discussed.  Continue garbapentine 300mg  TID.   # Hypertension, has been off BP med.  I will restart her on Lisinopril 20mg  daily. Refer patient to re-establish care with primary care physician.   # Weight loss  Continue nutrition supplementation. Follow up with nutritionist.   Supportive care measures are necessary for patient well-being and will be provided as necessary. We spent sufficient time to discuss many aspect of care, questions were answered to patient's satisfaction.    #Right facial numbness and the pain Patient takes Percocet 5/325 every 4 hours as needed.  I will review her pain medication. Recommend to add gabapentin 300 mg daily.  Will titrate dose up if needed. Refer to palliative care service.  We will also consult social worker as patient is concerned about her insurance coverage and financial difficulty.  Weight loss, I recommend patient to start nutrition supplementation.  Patient was given nutrition supplementation  samples today in office.  Refer to nutritionist.   Orders Placed This Encounter  Procedures  . Comprehensive metabolic panel    Standing Status:   Standing    Number of Occurrences:   20    Standing Expiration Date:   07/29/2021  . CBC with Differential    Standing Status:   Standing    Number of Occurrences:   20    Standing Expiration Date:   07/29/2021  . Magnesium    Standing Status:   Standing    Number of Occurrences:   20    Standing Expiration Date:   07/29/2021  . PHYSICIAN COMMUNICATION ORDER    A baseline Audiogram is recommended prior to initiation of cisplatin chemotherapy.    All questions were answered. The patient knows to call the clinic with any problems questions or concerns.   Return of visit: 08/08/2020 Thank you for this kind referral and the opportunity to participate in the care of this patient. A copy of today's note is routed to referring provider    Earlie Server, MD, PhD Hematology Oncology [facility] Pager- [phone] 07/29/2020

## 2020-07-29 NOTE — Progress Notes (Signed)
START ON PATHWAY REGIMEN - Head and Neck     A cycle is every 21 days:     Cisplatin   **Always confirm dose/schedule in your pharmacy ordering system**  Patient Characteristics: Other Sites, Preoperative or Nonsurgical Candidate, Stage III - IVB Disease Classification: Other Sites AJCC 8 Stage Grouping: IVB Therapeutic Status: Preoperative or Nonsurgical Candidate Intent of Therapy: Curative Intent, Discussed with Patient

## 2020-07-30 ENCOUNTER — Ambulatory Visit: Payer: Self-pay | Admitting: General Surgery

## 2020-07-30 NOTE — Telephone Encounter (Signed)
Dr. Tasia Catchings would like slides to be sent to our pathlogy lab for P16 staining. I contacted ENT in [place] to see where request needed to be sent and to follow up on path report results. Unable to reach anyone. Left message for them to return call and let us know.

## 2020-07-31 ENCOUNTER — Encounter
Admission: RE | Admit: 2020-07-31 | Discharge: 2020-07-31 | Disposition: A | Discharge status: Home / Self Care | Payer: Self-pay | Source: Ambulatory Visit | Attending: General Surgery | Admitting: General Surgery

## 2020-07-31 ENCOUNTER — Other Ambulatory Visit: Payer: Self-pay

## 2020-07-31 DIAGNOSIS — I10 Essential (primary) hypertension: Secondary | ICD-10-CM | POA: Insufficient documentation

## 2020-07-31 DIAGNOSIS — Z01818 Encounter for other preprocedural examination: Secondary | ICD-10-CM | POA: Insufficient documentation

## 2020-07-31 HISTORY — DX: Headache, unspecified: R51.9

## 2020-07-31 HISTORY — DX: Unspecified convulsions: R56.9

## 2020-07-31 NOTE — Patient Instructions (Addendum)
Your procedure is scheduled on:08-05-20 MONDAY Report to Day Surgery on the 2nd floor of the [facility]. To find out your arrival time, please call [phone] between 1PM - 3PM on:08-02-20 FRIDAY  REMEMBER: Instructions that are not followed completely may result in serious medical risk, up to and including death; or upon the discretion of your surgeon and anesthesiologist your surgery may need to be rescheduled.  Do not eat food after midnight the night before surgery.  No gum chewing, lozengers or hard candies.  You may however, drink CLEAR liquids up to 2 hours before you are scheduled to arrive for your surgery. Do not drink anything within 2 hours of your scheduled arrival time.  Clear liquids include: - water  - apple juice without pulp - gatorade (not RED) - black coffee or tea (Do NOT add milk or creamers to the coffee or tea) Do NOT drink anything that is not on this list..   TAKE THESE MEDICATIONS THE MORNING OF SURGERY WITH A SIP OF WATER: -GABAPENTIN (NEURONTIN) -YOU MAY TAKE PERCOCET (OXYCODONE/ACETAMINOPHEN) THE MORNING OF SURGERY IF NEEDED -CONTINUE YOUR DURAGESIC (FENTANYL) PATCH AS INSTRUCTED  One week prior to surgery: Stop Anti-inflammatories (NSAIDS) such as Advil, Aleve, Ibuprofen, Motrin, Naproxen, Naprosyn and Aspirin based products such as Excedrin, Goodys Powder, BC Powder-OK TO TAKE TYLENOL/PERCOCET IF NEEDED  Stop ANY OVER THE COUNTER supplements until after surgery. (You may continue taking Tylenol, Vitamin D, Vitamin B, and multivitamin.)-STOP YOUR MELATONIN AND GINKGO BILOBA NOW-YOU MAY RESUME AFTER SURGERY  No Alcohol for 24 hours before or after surgery.  No Smoking including e-cigarettes for 24 hours prior to surgery.  No chewable tobacco products for at least 6 hours prior to surgery.  No nicotine patches on the day of surgery.  Do not use any "recreational" drugs for at least a week prior to your surgery.  Please be advised that the  combination of cocaine and anesthesia may have negative outcomes, up to and including death. If you test positive for cocaine, your surgery will be cancelled.  On the morning of surgery brush your teeth with toothpaste and water, you may rinse your mouth with mouthwash if you wish. Do not swallow any toothpaste or mouthwash.  Do not wear jewelry, make-up, hairpins, clips or nail polish.  Do not wear lotions, powders, or perfumes.   Do not shave 48 hours prior to surgery.   Contact lenses, hearing aids and dentures may not be worn into surgery.  Do not bring valuables to the hospital. [facility] is not responsible for any missing/lost belongings or valuables.   Use CHG Soap as directed on instruction sheet.  Notify your doctor if there is any change in your medical condition (cold, fever, infection).  Wear comfortable clothing (specific to your surgery type) to the hospital.  Plan for stool softeners for home use; pain medications have a tendency to cause constipation. You can also help prevent constipation by eating foods high in fiber such as fruits and vegetables and drinking plenty of fluids as your diet allows.  After surgery, you can help prevent lung complications by doing breathing exercises.  Take deep breaths and cough every 1-2 hours. Your doctor may order a device called an Incentive Spirometer to help you take deep breaths. When coughing or sneezing, hold a pillow firmly against your incision with both hands. This is called "splinting." Doing this helps protect your incision. It also decreases belly discomfort.  If you are being admitted to the hospital overnight,  leave your suitcase in the car. After surgery it may be brought to your room.  If you are being discharged the day of surgery, you will not be allowed to drive home. You will need a responsible adult (18 years or older) to drive you home and stay with you that night.   If you are taking public transportation,  you will need to have a responsible adult (18 years or older) with you. Please confirm with your physician that it is acceptable to use public transportation.   Please call the [facility] at [phone] if you have any questions about these instructions.  Visitation Policy:  Patients undergoing a surgery or procedure may have one family member or support person with them as long as that person is not COVID-19 positive or experiencing its symptoms.  That person may remain in the waiting area during the procedure.  Inpatient Visitation Update:   In an effort to ensure the safety of our team members and our patients, we are implementing a change to our visitation policy:  Effective Monday, Aug. 9, at 7 a.m., inpatients will be allowed one support person.  o The support person may change daily.  o The support person must pass our screening, gel in and out, and wear a mask at all times, including in the patient's room.  o Patients must also wear a mask when staff or their support person are in the room.  o Masking is required regardless of vaccination status.  Systemwide, no visitors 17 or younger.

## 2020-07-31 NOTE — Progress Notes (Signed)
Pharmacist Chemotherapy Monitoring - Initial Assessment    Anticipated start date: 08/08/20   Regimen:   Are orders appropriate based on the patients diagnosis, regimen, and cycle? Yes  Does the plan date match the patients scheduled date? Yes  Is the sequencing of drugs appropriate? Yes  Are the premedications appropriate for the patients regimen? Yes  Prior Authorization for treatment is: Approved o If applicable, is the correct biosimilar selected based on the patient's insurance? not applicable  Organ Function and Labs:  Are dose adjustments needed based on the patient's renal function, hepatic function, or hematologic function? No  Are appropriate labs ordered prior to the start of patient's treatment? Yes  Other organ system assessment, if indicated: cisplatin: baseline audiogram  The following baseline labs, if indicated, have been ordered: cisplatin: K, Mg  Dose Assessment:  Are the drug doses appropriate? Yes  Are the following correct: o Drug concentrations Yes o IV fluid compatible with drug Yes o Administration routes Yes o Timing of therapy Yes  If applicable, does the patient have documented access for treatment and/or plans for port-a-cath placement? no  If applicable, have lifetime cumulative doses been properly documented and assessed? yes Lifetime Dose Tracking  No doses have been documented on this patient for the following tracked chemicals: Doxorubicin, Epirubicin, Idarubicin, Daunorubicin, Mitoxantrone, Bleomycin, Oxaliplatin, Carboplatin, Liposomal Doxorubicin  o   Toxicity Monitoring/Prevention:  The patient has the following take home antiemetics prescribed: Prochlorperazine  The patient has the following take home medications prescribed: N/A  Medication allergies and previous infusion related reactions, if applicable, have been reviewed and addressed. Yes  The patient's current medication list has been assessed for drug-drug interactions  with their chemotherapy regimen. no significant drug-drug interactions were identified on review.  Order Review:  Are the treatment plan orders signed? No  Is the patient scheduled to see a provider prior to their treatment? Yes  I verify that I have reviewed each item in the above checklist and answered each question accordingly.  Adelina Mings 07/31/2020 3:39 PM

## 2020-08-01 ENCOUNTER — Ambulatory Visit: Payer: Self-pay | Admitting: General Surgery

## 2020-08-01 ENCOUNTER — Inpatient Hospital Stay: Payer: Self-pay

## 2020-08-01 ENCOUNTER — Inpatient Hospital Stay: Payer: Self-pay | Admitting: Oncology

## 2020-08-01 ENCOUNTER — Other Ambulatory Visit: Admission: RE | Admit: 2020-08-01 | Payer: Self-pay | Source: Ambulatory Visit

## 2020-08-01 ENCOUNTER — Encounter
Admission: RE | Admit: 2020-08-01 | Discharge: 2020-08-01 | Disposition: A | Discharge status: Home / Self Care | Payer: Self-pay | Source: Ambulatory Visit | Attending: General Surgery | Admitting: General Surgery

## 2020-08-01 DIAGNOSIS — Z20822 Contact with and (suspected) exposure to covid-19: Secondary | ICD-10-CM | POA: Insufficient documentation

## 2020-08-01 DIAGNOSIS — Z01818 Encounter for other preprocedural examination: Secondary | ICD-10-CM | POA: Insufficient documentation

## 2020-08-01 DIAGNOSIS — Z01810 Encounter for preprocedural cardiovascular examination: Secondary | ICD-10-CM

## 2020-08-01 DIAGNOSIS — I10 Essential (primary) hypertension: Secondary | ICD-10-CM | POA: Insufficient documentation

## 2020-08-01 LAB — SARS CORONAVIRUS 2 (TAT 6-24 HRS): SARS Coronavirus 2: NEGATIVE

## 2020-08-01 NOTE — H&P (View-Only) (Signed)
PATIENT PROFILE: Shannon Schroeder is a 64 y.o. female who presents to the Clinic for consultation at the request of Dr. Tasia Catchings for evaluation of Chemo-Port placement.  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  She has been under the care of Dr. Tasia Catchings for maxillary sinus cancer.  Chemotherapy has been recommended as treatment.  Patient consulted for insertion of Port-A-Cath.  Patient denies any pain today.  There is no pain radiation.  There is no alleviating or aggravating factor.   PROBLEM LIST:        Problem List  Date Reviewed: 08/11/2018       Noted   Hypotension, unspecified 06/25/2018   Cerebral edema (CMS-HCC) 06/25/2018   Chronic hyponatremia 06/17/2018   Subarachnoid bleed (CMS-HCC) 06/16/2018   Cerebral aneurysm 06/16/2018   Hypertension, essential 06/16/2018   Hypertensive emergency 06/16/2018      GENERAL REVIEW OF SYSTEMS:   General ROS: negative for - chills, fatigue, fever, weight gain.  Positive for weight loss Allergy and Immunology ROS: negative for - hives  Hematological and Lymphatic ROS: negative for - bleeding problems or bruising, negative for palpable nodes Endocrine ROS: negative for - heat or cold intolerance, hair changes Respiratory ROS: negative for - cough, shortness of breath or wheezing Cardiovascular ROS: no chest pain or palpitations GI ROS: negative for nausea, vomiting, abdominal pain, diarrhea, constipation Musculoskeletal ROS: negative for - joint swelling or muscle pain Neurological ROS: negative for - confusion, syncope Dermatological ROS: negative for pruritus and rash Psychiatric: negative for anxiety, depression, difficulty sleeping and memory loss  MEDICATIONS: Current Medications        Current Outpatient Medications  Medication Sig Dispense Refill  . acetaminophen (TYLENOL) 500 MG tablet Take by mouth    . b complex vitamins capsule Take by mouth  once daily    . fentaNYL (DURAGESIC) 25 mcg/hr patch Place onto the skin every third day    . gabapentin (NEURONTIN) 300 MG capsule Take by mouth 3 (three) times daily    . ibuprofen (MOTRIN) 400 MG tablet Take by mouth as needed    . lidocaine-prilocaine (EMLA) cream as needed    . lisinopriL (ZESTRIL) 30 MG tablet Take 1 tablet by mouth once daily    . melatonin 10 mg Tab Take by mouth as needed    . oxyCODONE-acetaminophen (PERCOCET) 5-325 mg tablet Take by mouth every 4 (four) hours as needed    . prochlorperazine (COMPAZINE) 10 MG tablet Take by mouth as needed     No current facility-administered medications for this visit.      ALLERGIES: Aspirin, Belladonna alkaloids, Naproxen, Paxil [paroxetine hcl], Phenobarbital, Prozac [fluoxetine], and Fluoxetine hcl  PAST MEDICAL HISTORY:     Past Medical History:  Diagnosis Date  . Anxiety   . Bipolar affective disorder (CMS-HCC)   . Chronic hyponatremia    baseline Na 124-127  . Depression   . Hypertension   . Ventricular tachycardia (CMS-HCC)     PAST SURGICAL HISTORY:      Past Surgical History:  Procedure Laterality Date  . COMPLEX REPAIR ANEURYSM VERTEBROBASILAR CIRCULATION INTRACRANIAL Right 06/24/2018   Procedure: ANEURYSM, Wilhemena Durie, SIMPLE SURG;  Surgeon: Hoy Finlay, MD;  Location: [facility];  Service: Neurosurgery;  Laterality: Right;  . MICROSURGERY Right 06/24/2018   Procedure: MICROSURGERY;  Surgeon: Hoy Finlay, MD;  Location: [facility];  Service: Neurosurgery;  Laterality: Right;  . REPAIR ANEURYSM CAROTID  CIRCULATION (INTRACRANIAL APPROACH) Right 06/24/2018   Procedure: REPAIR ANEURYSM CAROTID CIRCULATION (INTRACRANIAL APPROACH);  Surgeon: Hoy Finlay, MD;  Location: [facility];  Service: Neurosurgery;  Laterality: Right;     FAMILY HISTORY: History reviewed. No pertinent family history.   SOCIAL  HISTORY: Social History          Socioeconomic History  . Marital status: Divorced    Spouse name: Not on file  . Number of children: Not on file  . Years of education: Not on file  . Highest education level: Not on file  Occupational History  . Not on file  Tobacco Use  . Smoking status: Current Every Day Smoker    Packs/day: 1.00    Years: 40.00    Pack years: 40.00    Types: Cigarettes  . Smokeless tobacco: Never Used  Vaping Use  . Vaping Use: Never used  Substance and Sexual Activity  . Alcohol use: Not on file  . Drug use: Not on file  . Sexual activity: Not on file  Other Topics Concern  . Not on file  Social History Narrative  . Not on file   Social Determinants of Health      Financial Resource Strain:   . Difficulty of Paying Living Expenses:   Food Insecurity:   . Worried About Charity fundraiser in the Last Year:   . Arboriculturist in the Last Year:   Transportation Needs:   . Film/video editor (Medical):   Marland Kitchen Lack of Transportation (Non-Medical):       PHYSICAL EXAM: There were no vitals filed for this visit. There is no height or weight on file to calculate BMI.     GENERAL: Alert, active, oriented x3  HEENT: Pupils equal reactive to light. Extraocular movements are intact. Sclera clear. Palpebral conjunctiva normal red color.Pharynx clear.  NECK: Supple with no palpable mass and no adenopathy.  LUNGS: Sound clear with no rales rhonchi or wheezes.  HEART: Regular rhythm S1 and S2 without murmur.  ABDOMEN: Soft and depressible, nontender with no palpable mass, no hepatomegaly. Wounds dry and clean.  EXTREMITIES: Well-developed well-nourished symmetrical with no dependent edema.  NEUROLOGICAL: Awake alert oriented, facial expression symmetrical, moving all extremities.  REVIEW OF DATA: I have reviewed the following data today:      No visits with results within 3 Month(s) from this visit.  Latest known  visit with results is:  Admission on 06/24/2018, Discharged on 06/27/2018  Component Date Value  . ABO RH TYPE 06/24/2018 O Positive   . Antibody Screen 06/24/2018 Negative   . Specimen Outdate 06/24/2018 06-27-2018 23:59   . Cross Match Result 07-28-18 Compatible   . Unit Blood Type 2018/07/28 O Pos   . Unit Number Jul 28, 2018 [id number]   . Blood Expiration Date 07-28-2018 [id number]   . Status Information 2018/07/28 Released   . Product Identification [id number] Red Blood Cells   . Product Code 07-28-2018 [id number]   . Coded Unit Blood Type 07-28-18 5100   . Cross Match Result 07-28-18 Compatible   . Unit Blood Type Jul 28, 2018 O Pos   . Unit Number 07/28/18 [id number]   . Blood Expiration Date 28-Jul-2018 [id number]   . Status Information 28-Jul-2018 Released   . Product Identification [id number] Red Blood Cells   . Product Code 2018/07/28 [id number]   . Coded Unit Blood Type 07/28/2018 5100   . Patient Temperature, Art* 06/24/2018 37.0   . FIO2, Arterial 06/24/2018    . PH,  Arterial 06/24/2018 7.32*  . PCO2, Arterial 06/24/2018 45   . PO2, Arterial 06/24/2018 160*  . Base Excess, Arterial 06/24/2018 -3   . Bicarbonate, Arterial 06/24/2018 23   . Total CO2, Arterial 06/24/2018 25   . Hemoglobin, Arterial 06/24/2018 11.1*  . Hematocrit, Arterial 06/24/2018 33.0*  . % O2 Hemoglobin, Arterial 06/24/2018 94.3   . % CO Hemoglobin, Arterial 06/24/2018 4.5*  . % Methemoglobin, Arterial 06/24/2018 0.8   . Volume % O2, Arterial 06/24/2018 15.1   . Sodium, Arterial 06/24/2018 122*  . Potassium, Arterial 06/24/2018 3.9   . Glucose, Nonfasting, Art* 06/24/2018 107   . Calcium, Ionized 06/24/2018 1.07*  . Lactate, Whole Blood 06/24/2018 0.9   . Vent Rate (bpm) 06/24/2018 51   . PR Interval (msec) 06/24/2018 216   . QRS Interval (msec) 06/24/2018 100   . QT Interval (msec) 06/24/2018 472   . QTc (msec) 06/24/2018 435   . Sodium 06/24/2018 124*  . Potassium 06/24/2018  4.1   . Chloride 06/24/2018 94*  . Carbon Dioxide (CO2) 06/24/2018 22   . Urea Nitrogen (BUN) 06/24/2018 8   . Creatinine 06/24/2018 0.5   . Glucose 06/24/2018 151*  . Calcium 06/24/2018 8.7   . Anion Gap 06/24/2018 8   . BUN/CREA Ratio 06/24/2018 16   . Glomerular Filtration Ra* 06/24/2018 103   . Phosphorus 06/24/2018 3.8   . Magnesium 06/24/2018 1.8   . WBC (White Blood Cell Co* 06/25/2018 12.4*  . Hemoglobin 06/25/2018 11.5*  . Hematocrit 06/25/2018 32.6*  . Platelets 06/25/2018 394   . MCV (Mean Corpuscular Vo* 06/25/2018 95   . MCH (Mean Corpuscular He* 06/25/2018 33.3   . MCHC (Mean Corpuscular H* 06/25/2018 35.3   . RBC (Red Blood Cell Coun* 06/25/2018 3.45*  . RDW-CV (Red Cell Distrib* 06/25/2018 13.4   . NRBC (Nucleated Red Bloo* 06/25/2018 0.00   . NRBC % (Nucleated Red Bl* 06/25/2018 0.0   . MPV (Mean Platelet Volum* 06/25/2018 8.8   . Sodium 06/25/2018 124*  . Potassium 06/25/2018 4.4   . Chloride 06/25/2018 95*  . Carbon Dioxide (CO2) 06/25/2018 21   . Urea Nitrogen (BUN) 06/25/2018 6*  . Creatinine 06/25/2018 0.4   . Glucose 06/25/2018 149*  . Calcium 06/25/2018 8.4*  . Anion Gap 06/25/2018 8   . BUN/CREA Ratio 06/25/2018 15   . Glomerular Filtration Ra* 06/25/2018 111   . Magnesium 06/25/2018 1.9   . Phosphorus 06/25/2018 3.1   . Calcium, Ionized 06/25/2018 1.10*  . WBC (White Blood Cell Co* 06/26/2018 6.4   . Hemoglobin 06/26/2018 10.9*  . Hematocrit 06/26/2018 31.4*  . Platelets 06/26/2018 320   . MCV (Mean Corpuscular Vo* 06/26/2018 95   . MCH (Mean Corpuscular He* 06/26/2018 33.1   . MCHC (Mean Corpuscular H* 06/26/2018 34.7   . RBC (Red Blood Cell Coun* 06/26/2018 3.29*  . RDW-CV (Red Cell Distrib* 06/26/2018 13.8   . NRBC (Nucleated Red Bloo* 06/26/2018 0.00   . NRBC % (Nucleated Red Bl* 06/26/2018 0.0   . MPV (Mean Platelet Volum* 06/26/2018 9.1   . Sodium 06/26/2018 129*  . Potassium 06/26/2018 4.0   . Chloride 06/26/2018 97*  . Carbon  Dioxide (CO2) 06/26/2018 23   . Urea Nitrogen (BUN) 06/26/2018 3*  . Creatinine 06/26/2018 0.5   . Glucose 06/26/2018 123   . Calcium 06/26/2018 7.9*  . Anion Gap 06/26/2018 9   . BUN/CREA Ratio 06/26/2018 6   . Glomerular Filtration Ra* 06/26/2018 103   .  Magnesium 06/26/2018 2.0   . Phosphorus 06/26/2018 2.5   . Calcium, Ionized 06/26/2018 1.06*     ASSESSMENT: Shannon Schroeder is a 64 y.o. female presenting for consultation for surgeon of Port-A-Cath.    Patient with maxillary sinus cancer.  She needs chemotherapy.  She was oriented about the Port-A-Cath.  She was oriented about the procedure.  She was oriented about the risks of surgery that includes pneumothorax, hemothorax, bleeding, infection, arteriovenous fistula, deep vein thrombosis, hematoma, among others.  Patient reports understood and agreed to proceed.  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 Insertion of Port a Cath [phone], [id number], [id number]) 2. Do not take aspirin 5 days before surgery 3. Contact us if has any question or concern.   Patient and/or representative verbalized understanding, all questions were answered, and were agreeable with the plan outlined above.     Herbert Pun, MD  Electronically signed by Herbert Pun, MD

## 2020-08-01 NOTE — H&P (Signed)
PATIENT PROFILE: Shannon Schroeder is a 64 y.o. female who presents to the Clinic for consultation at the request of Dr. Tasia Catchings for evaluation of Chemo-Port placement.  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  She has been under the care of Dr. Tasia Catchings for maxillary sinus cancer.  Chemotherapy has been recommended as treatment.  Patient consulted for insertion of Port-A-Cath.  Patient denies any pain today.  There is no pain radiation.  There is no alleviating or aggravating factor.   PROBLEM LIST:        Problem List  Date Reviewed: 08/11/2018       Noted   Hypotension, unspecified 06/25/2018   Cerebral edema (CMS-HCC) 06/25/2018   Chronic hyponatremia 06/17/2018   Subarachnoid bleed (CMS-HCC) 06/16/2018   Cerebral aneurysm 06/16/2018   Hypertension, essential 06/16/2018   Hypertensive emergency 06/16/2018      GENERAL REVIEW OF SYSTEMS:   General ROS: negative for - chills, fatigue, fever, weight gain.  Positive for weight loss Allergy and Immunology ROS: negative for - hives  Hematological and Lymphatic ROS: negative for - bleeding problems or bruising, negative for palpable nodes Endocrine ROS: negative for - heat or cold intolerance, hair changes Respiratory ROS: negative for - cough, shortness of breath or wheezing Cardiovascular ROS: no chest pain or palpitations GI ROS: negative for nausea, vomiting, abdominal pain, diarrhea, constipation Musculoskeletal ROS: negative for - joint swelling or muscle pain Neurological ROS: negative for - confusion, syncope Dermatological ROS: negative for pruritus and rash Psychiatric: negative for anxiety, depression, difficulty sleeping and memory loss  MEDICATIONS: Current Medications        Current Outpatient Medications  Medication Sig Dispense Refill  . acetaminophen (TYLENOL) 500 MG tablet Take by mouth    . b complex vitamins capsule Take by mouth  once daily    . fentaNYL (DURAGESIC) 25 mcg/hr patch Place onto the skin every third day    . gabapentin (NEURONTIN) 300 MG capsule Take by mouth 3 (three) times daily    . ibuprofen (MOTRIN) 400 MG tablet Take by mouth as needed    . lidocaine-prilocaine (EMLA) cream as needed    . lisinopriL (ZESTRIL) 30 MG tablet Take 1 tablet by mouth once daily    . melatonin 10 mg Tab Take by mouth as needed    . oxyCODONE-acetaminophen (PERCOCET) 5-325 mg tablet Take by mouth every 4 (four) hours as needed    . prochlorperazine (COMPAZINE) 10 MG tablet Take by mouth as needed     No current facility-administered medications for this visit.      ALLERGIES: Aspirin, Belladonna alkaloids, Naproxen, Paxil [paroxetine hcl], Phenobarbital, Prozac [fluoxetine], and Fluoxetine hcl  PAST MEDICAL HISTORY:     Past Medical History:  Diagnosis Date  . Anxiety   . Bipolar affective disorder (CMS-HCC)   . Chronic hyponatremia    baseline Na 124-127  . Depression   . Hypertension   . Ventricular tachycardia (CMS-HCC)     PAST SURGICAL HISTORY:      Past Surgical History:  Procedure Laterality Date  . COMPLEX REPAIR ANEURYSM VERTEBROBASILAR CIRCULATION INTRACRANIAL Right 06/24/2018   Procedure: ANEURYSM, Wilhemena Durie, SIMPLE SURG;  Surgeon: Hoy Finlay, MD;  Location: [facility];  Service: Neurosurgery;  Laterality: Right;  . MICROSURGERY Right 06/24/2018   Procedure: MICROSURGERY;  Surgeon: Hoy Finlay, MD;  Location: [facility];  Service: Neurosurgery;  Laterality: Right;  . REPAIR ANEURYSM CAROTID  CIRCULATION (INTRACRANIAL APPROACH) Right 06/24/2018   Procedure: REPAIR ANEURYSM CAROTID CIRCULATION (INTRACRANIAL APPROACH);  Surgeon: Hoy Finlay, MD;  Location: [facility];  Service: Neurosurgery;  Laterality: Right;     FAMILY HISTORY: History reviewed. No pertinent family history.   SOCIAL  HISTORY: Social History          Socioeconomic History  . Marital status: Divorced    Spouse name: Not on file  . Number of children: Not on file  . Years of education: Not on file  . Highest education level: Not on file  Occupational History  . Not on file  Tobacco Use  . Smoking status: Current Every Day Smoker    Packs/day: 1.00    Years: 40.00    Pack years: 40.00    Types: Cigarettes  . Smokeless tobacco: Never Used  Vaping Use  . Vaping Use: Never used  Substance and Sexual Activity  . Alcohol use: Not on file  . Drug use: Not on file  . Sexual activity: Not on file  Other Topics Concern  . Not on file  Social History Narrative  . Not on file   Social Determinants of Health      Financial Resource Strain:   . Difficulty of Paying Living Expenses:   Food Insecurity:   . Worried About Charity fundraiser in the Last Year:   . Arboriculturist in the Last Year:   Transportation Needs:   . Film/video editor (Medical):   Marland Kitchen Lack of Transportation (Non-Medical):       PHYSICAL EXAM: There were no vitals filed for this visit. There is no height or weight on file to calculate BMI.     GENERAL: Alert, active, oriented x3  HEENT: Pupils equal reactive to light. Extraocular movements are intact. Sclera clear. Palpebral conjunctiva normal red color.Pharynx clear.  NECK: Supple with no palpable mass and no adenopathy.  LUNGS: Sound clear with no rales rhonchi or wheezes.  HEART: Regular rhythm S1 and S2 without murmur.  ABDOMEN: Soft and depressible, nontender with no palpable mass, no hepatomegaly. Wounds dry and clean.  EXTREMITIES: Well-developed well-nourished symmetrical with no dependent edema.  NEUROLOGICAL: Awake alert oriented, facial expression symmetrical, moving all extremities.  REVIEW OF DATA: I have reviewed the following data today:      No visits with results within 3 Month(s) from this visit.  Latest known  visit with results is:  Admission on 06/24/2018, Discharged on 06/27/2018  Component Date Value  . ABO RH TYPE 06/24/2018 O Positive   . Antibody Screen 06/24/2018 Negative   . Specimen Outdate 06/24/2018 06-27-2018 23:59   . Cross Match Result Jul 26, 2018 Compatible   . Unit Blood Type 07/26/2018 O Pos   . Unit Number 07/26/18 [id number]   . Blood Expiration Date 07-26-18 [id number]   . Status Information 2018/07/26 Released   . Product Identification [id number] Red Blood Cells   . Product Code 07/26/2018 [id number]   . Coded Unit Blood Type Jul 26, 2018 5100   . Cross Match Result 2018/07/26 Compatible   . Unit Blood Type 07-26-18 O Pos   . Unit Number 26-Jul-2018 [id number]   . Blood Expiration Date 26-Jul-2018 [id number]   . Status Information 2018/07/26 Released   . Product Identification [id number] Red Blood Cells   . Product Code 2018/07/26 [id number]   . Coded Unit Blood Type July 26, 2018 5100   . Patient Temperature, Art* 06/24/2018 37.0   . FIO2, Arterial 06/24/2018    . PH,  Arterial 06/24/2018 7.32*  . PCO2, Arterial 06/24/2018 45   . PO2, Arterial 06/24/2018 160*  . Base Excess, Arterial 06/24/2018 -3   . Bicarbonate, Arterial 06/24/2018 23   . Total CO2, Arterial 06/24/2018 25   . Hemoglobin, Arterial 06/24/2018 11.1*  . Hematocrit, Arterial 06/24/2018 33.0*  . % O2 Hemoglobin, Arterial 06/24/2018 94.3   . % CO Hemoglobin, Arterial 06/24/2018 4.5*  . % Methemoglobin, Arterial 06/24/2018 0.8   . Volume % O2, Arterial 06/24/2018 15.1   . Sodium, Arterial 06/24/2018 122*  . Potassium, Arterial 06/24/2018 3.9   . Glucose, Nonfasting, Art* 06/24/2018 107   . Calcium, Ionized 06/24/2018 1.07*  . Lactate, Whole Blood 06/24/2018 0.9   . Vent Rate (bpm) 06/24/2018 51   . PR Interval (msec) 06/24/2018 216   . QRS Interval (msec) 06/24/2018 100   . QT Interval (msec) 06/24/2018 472   . QTc (msec) 06/24/2018 435   . Sodium 06/24/2018 124*  . Potassium 06/24/2018  4.1   . Chloride 06/24/2018 94*  . Carbon Dioxide (CO2) 06/24/2018 22   . Urea Nitrogen (BUN) 06/24/2018 8   . Creatinine 06/24/2018 0.5   . Glucose 06/24/2018 151*  . Calcium 06/24/2018 8.7   . Anion Gap 06/24/2018 8   . BUN/CREA Ratio 06/24/2018 16   . Glomerular Filtration Ra* 06/24/2018 103   . Phosphorus 06/24/2018 3.8   . Magnesium 06/24/2018 1.8   . WBC (White Blood Cell Co* 06/25/2018 12.4*  . Hemoglobin 06/25/2018 11.5*  . Hematocrit 06/25/2018 32.6*  . Platelets 06/25/2018 394   . MCV (Mean Corpuscular Vo* 06/25/2018 95   . MCH (Mean Corpuscular He* 06/25/2018 33.3   . MCHC (Mean Corpuscular H* 06/25/2018 35.3   . RBC (Red Blood Cell Coun* 06/25/2018 3.45*  . RDW-CV (Red Cell Distrib* 06/25/2018 13.4   . NRBC (Nucleated Red Bloo* 06/25/2018 0.00   . NRBC % (Nucleated Red Bl* 06/25/2018 0.0   . MPV (Mean Platelet Volum* 06/25/2018 8.8   . Sodium 06/25/2018 124*  . Potassium 06/25/2018 4.4   . Chloride 06/25/2018 95*  . Carbon Dioxide (CO2) 06/25/2018 21   . Urea Nitrogen (BUN) 06/25/2018 6*  . Creatinine 06/25/2018 0.4   . Glucose 06/25/2018 149*  . Calcium 06/25/2018 8.4*  . Anion Gap 06/25/2018 8   . BUN/CREA Ratio 06/25/2018 15   . Glomerular Filtration Ra* 06/25/2018 111   . Magnesium 06/25/2018 1.9   . Phosphorus 06/25/2018 3.1   . Calcium, Ionized 06/25/2018 1.10*  . WBC (White Blood Cell Co* 06/26/2018 6.4   . Hemoglobin 06/26/2018 10.9*  . Hematocrit 06/26/2018 31.4*  . Platelets 06/26/2018 320   . MCV (Mean Corpuscular Vo* 06/26/2018 95   . MCH (Mean Corpuscular He* 06/26/2018 33.1   . MCHC (Mean Corpuscular H* 06/26/2018 34.7   . RBC (Red Blood Cell Coun* 06/26/2018 3.29*  . RDW-CV (Red Cell Distrib* 06/26/2018 13.8   . NRBC (Nucleated Red Bloo* 06/26/2018 0.00   . NRBC % (Nucleated Red Bl* 06/26/2018 0.0   . MPV (Mean Platelet Volum* 06/26/2018 9.1   . Sodium 06/26/2018 129*  . Potassium 06/26/2018 4.0   . Chloride 06/26/2018 97*  . Carbon  Dioxide (CO2) 06/26/2018 23   . Urea Nitrogen (BUN) 06/26/2018 3*  . Creatinine 06/26/2018 0.5   . Glucose 06/26/2018 123   . Calcium 06/26/2018 7.9*  . Anion Gap 06/26/2018 9   . BUN/CREA Ratio 06/26/2018 6   . Glomerular Filtration Ra* 06/26/2018 103   .  Magnesium 06/26/2018 2.0   . Phosphorus 06/26/2018 2.5   . Calcium, Ionized 06/26/2018 1.06*     ASSESSMENT: Shannon Schroeder is a 64 y.o. female presenting for consultation for surgeon of Port-A-Cath.    Patient with maxillary sinus cancer.  She needs chemotherapy.  She was oriented about the Port-A-Cath.  She was oriented about the procedure.  She was oriented about the risks of surgery that includes pneumothorax, hemothorax, bleeding, infection, arteriovenous fistula, deep vein thrombosis, hematoma, among others.  Patient reports understood and agreed to proceed.  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 Insertion of Port a Cath [phone], [id number], [id number]) 2. Do not take aspirin 5 days before surgery 3. Contact us if has any question or concern.   Patient and/or representative verbalized understanding, all questions were answered, and were agreeable with the plan outlined above.     Herbert Pun, MD  Electronically signed by Herbert Pun, MD

## 2020-08-05 ENCOUNTER — Ambulatory Visit: Payer: Medicaid Other

## 2020-08-05 ENCOUNTER — Encounter: Payer: Self-pay | Admitting: General Surgery

## 2020-08-05 ENCOUNTER — Other Ambulatory Visit: Payer: Self-pay

## 2020-08-05 ENCOUNTER — Encounter
Admission: RE | Disposition: A | Discharge status: Home / Self Care | Payer: Self-pay | Source: Home / Self Care | Attending: General Surgery

## 2020-08-05 ENCOUNTER — Inpatient Hospital Stay (HOSPITAL_BASED_OUTPATIENT_CLINIC_OR_DEPARTMENT_OTHER): Payer: Medicaid Other | Admitting: Hospice and Palliative Medicine

## 2020-08-05 ENCOUNTER — Ambulatory Visit: Payer: Medicaid Other | Admitting: Urgent Care

## 2020-08-05 ENCOUNTER — Ambulatory Visit: Payer: Medicaid Other | Admitting: Certified Registered Nurse Anesthetist

## 2020-08-05 ENCOUNTER — Ambulatory Visit
Admission: RE | Admit: 2020-08-05 | Discharge: 2020-08-05 | Disposition: A | Discharge status: Home / Self Care | Payer: Medicaid Other | Source: Home / Self Care | Attending: General Surgery | Admitting: General Surgery

## 2020-08-05 DIAGNOSIS — I10 Essential (primary) hypertension: Secondary | ICD-10-CM | POA: Insufficient documentation

## 2020-08-05 DIAGNOSIS — I959 Hypotension, unspecified: Secondary | ICD-10-CM | POA: Insufficient documentation

## 2020-08-05 DIAGNOSIS — J449 Chronic obstructive pulmonary disease, unspecified: Secondary | ICD-10-CM | POA: Insufficient documentation

## 2020-08-05 DIAGNOSIS — I4891 Unspecified atrial fibrillation: Secondary | ICD-10-CM | POA: Insufficient documentation

## 2020-08-05 DIAGNOSIS — Z885 Allergy status to narcotic agent status: Secondary | ICD-10-CM | POA: Insufficient documentation

## 2020-08-05 DIAGNOSIS — C310 Malignant neoplasm of maxillary sinus: Secondary | ICD-10-CM

## 2020-08-05 DIAGNOSIS — Z79899 Other long term (current) drug therapy: Secondary | ICD-10-CM | POA: Insufficient documentation

## 2020-08-05 DIAGNOSIS — R1310 Dysphagia, unspecified: Secondary | ICD-10-CM | POA: Insufficient documentation

## 2020-08-05 DIAGNOSIS — Z8673 Personal history of transient ischemic attack (TIA), and cerebral infarction without residual deficits: Secondary | ICD-10-CM | POA: Insufficient documentation

## 2020-08-05 DIAGNOSIS — R112 Nausea with vomiting, unspecified: Secondary | ICD-10-CM | POA: Insufficient documentation

## 2020-08-05 DIAGNOSIS — Z95828 Presence of other vascular implants and grafts: Secondary | ICD-10-CM

## 2020-08-05 DIAGNOSIS — R634 Abnormal weight loss: Secondary | ICD-10-CM | POA: Insufficient documentation

## 2020-08-05 DIAGNOSIS — F419 Anxiety disorder, unspecified: Secondary | ICD-10-CM | POA: Insufficient documentation

## 2020-08-05 DIAGNOSIS — Z5111 Encounter for antineoplastic chemotherapy: Secondary | ICD-10-CM | POA: Insufficient documentation

## 2020-08-05 DIAGNOSIS — Z23 Encounter for immunization: Secondary | ICD-10-CM | POA: Insufficient documentation

## 2020-08-05 DIAGNOSIS — E876 Hypokalemia: Secondary | ICD-10-CM | POA: Diagnosis not present

## 2020-08-05 DIAGNOSIS — I671 Cerebral aneurysm, nonruptured: Secondary | ICD-10-CM | POA: Insufficient documentation

## 2020-08-05 DIAGNOSIS — R000 Tachycardia, unspecified: Secondary | ICD-10-CM | POA: Insufficient documentation

## 2020-08-05 DIAGNOSIS — M1990 Unspecified osteoarthritis, unspecified site: Secondary | ICD-10-CM | POA: Insufficient documentation

## 2020-08-05 DIAGNOSIS — E871 Hypo-osmolality and hyponatremia: Secondary | ICD-10-CM | POA: Diagnosis not present

## 2020-08-05 DIAGNOSIS — Z510 Encounter for antineoplastic radiation therapy: Secondary | ICD-10-CM | POA: Diagnosis not present

## 2020-08-05 DIAGNOSIS — Z79891 Long term (current) use of opiate analgesic: Secondary | ICD-10-CM | POA: Insufficient documentation

## 2020-08-05 DIAGNOSIS — Z886 Allergy status to analgesic agent status: Secondary | ICD-10-CM | POA: Insufficient documentation

## 2020-08-05 DIAGNOSIS — F319 Bipolar disorder, unspecified: Secondary | ICD-10-CM | POA: Insufficient documentation

## 2020-08-05 DIAGNOSIS — Z888 Allergy status to other drugs, medicaments and biological substances status: Secondary | ICD-10-CM | POA: Insufficient documentation

## 2020-08-05 DIAGNOSIS — F17210 Nicotine dependence, cigarettes, uncomplicated: Secondary | ICD-10-CM | POA: Insufficient documentation

## 2020-08-05 DIAGNOSIS — G893 Neoplasm related pain (acute) (chronic): Secondary | ICD-10-CM | POA: Insufficient documentation

## 2020-08-05 DIAGNOSIS — Z515 Encounter for palliative care: Secondary | ICD-10-CM

## 2020-08-05 HISTORY — PX: PORTACATH PLACEMENT: SHX2246

## 2020-08-05 IMAGING — DX DG CHEST 1V PORT
1 series · 1 of 1 positions shown · non-contrast
Comparison: None.

CLINICAL DATA: Status post Port-A-Cath placement

EXAM:
PORTABLE CHEST 1 VIEW

[chest ap]
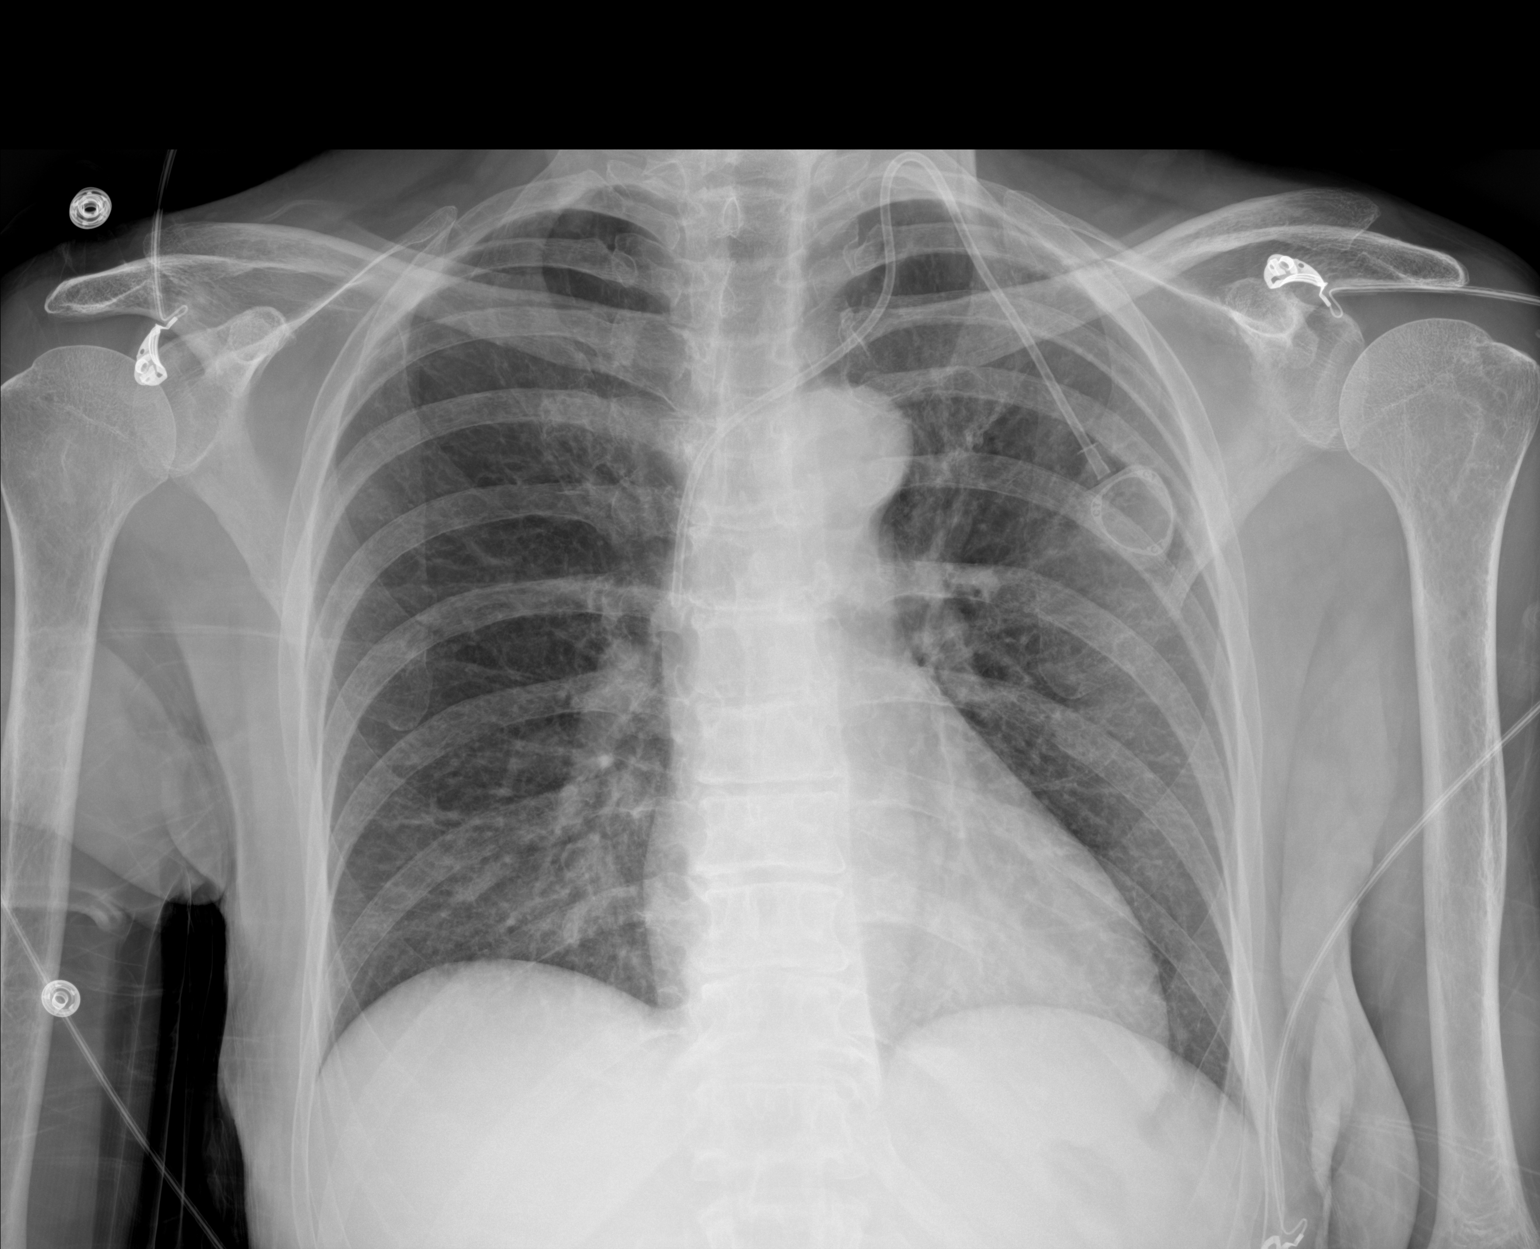

[1 of 1 positions shown; findings below may reference images not displayed]

FINDINGS: Left-sided Port-A-Cath with the tip projecting over the SVC. No
focal consolidation. No pleural effusion or pneumothorax. Heart and
mediastinal contours are unremarkable.

No acute osseous abnormality.
IMPRESSION: Left-sided Port-A-Cath with the tip projecting over the SVC.

## 2020-08-05 SURGERY — INSERTION, TUNNELED CENTRAL VENOUS DEVICE, WITH PORT
Anesthesia: General | Laterality: Left

## 2020-08-05 MED ORDER — HEPARIN SODIUM (PORCINE) 5000 UNIT/ML IJ SOLN
INTRAMUSCULAR | Status: AC
  Filled 2020-08-05: qty 1

## 2020-08-05 MED ORDER — ORAL CARE MOUTH RINSE: 15.0000 mL | Freq: Once | OROMUCOSAL | Status: AC

## 2020-08-05 MED ORDER — FAMOTIDINE 20 MG PO TABS
ORAL_TABLET | ORAL | Status: AC
  Administered 2020-08-05: 20 mg via ORAL
  Filled 2020-08-05: qty 1

## 2020-08-05 MED ORDER — ACETAMINOPHEN 10 MG/ML IV SOLN
INTRAVENOUS | Status: DC | PRN
  Administered 2020-08-05: 1000 mg via INTRAVENOUS

## 2020-08-05 MED ORDER — FENTANYL CITRATE (PF) 100 MCG/2ML IJ SOLN
25.0000 ug | INTRAMUSCULAR | Status: DC | PRN
  Administered 2020-08-05: 25 ug via INTRAVENOUS

## 2020-08-05 MED ORDER — FAMOTIDINE 20 MG PO TABS: 20.0000 mg | ORAL_TABLET | Freq: Once | ORAL | Status: AC

## 2020-08-05 MED ORDER — CHLORHEXIDINE GLUCONATE 0.12 % MT SOLN: 15.0000 mL | Freq: Once | OROMUCOSAL | Status: AC

## 2020-08-05 MED ORDER — ACETAMINOPHEN 10 MG/ML IV SOLN
INTRAVENOUS | Status: AC
  Filled 2020-08-05: qty 100

## 2020-08-05 MED ORDER — SODIUM CHLORIDE (PF) 0.9 % IJ SOLN
INTRAMUSCULAR | Status: DC | PRN
  Administered 2020-08-05: 500 mL

## 2020-08-05 MED ORDER — BUPIVACAINE-EPINEPHRINE (PF) 0.25% -1:200000 IJ SOLN
INTRAMUSCULAR | Status: DC | PRN
  Administered 2020-08-05: 15 mL

## 2020-08-05 MED ORDER — HYDROCODONE-ACETAMINOPHEN 5-325 MG PO TABS
1.0000 | ORAL_TABLET | ORAL | 0 refills | Status: AC | PRN
Start: 2020-08-05 — End: 2020-08-08

## 2020-08-05 MED ORDER — FENTANYL CITRATE (PF) 100 MCG/2ML IJ SOLN
INTRAMUSCULAR | Status: AC
  Filled 2020-08-05: qty 2

## 2020-08-05 MED ORDER — ONDANSETRON HCL 4 MG/2ML IJ SOLN: 4.0000 mg | Freq: Once | INTRAMUSCULAR | Status: DC | PRN

## 2020-08-05 MED ORDER — PROPOFOL 10 MG/ML IV BOLUS
INTRAVENOUS | Status: DC | PRN
  Administered 2020-08-05 (×2): 10 mg via INTRAVENOUS
  Administered 2020-08-05: 20 mg via INTRAVENOUS
  Administered 2020-08-05: 30 mg via INTRAVENOUS

## 2020-08-05 MED ORDER — LACTATED RINGERS IV SOLN: INTRAVENOUS | Status: DC

## 2020-08-05 MED ORDER — LIDOCAINE HCL (CARDIAC) PF 100 MG/5ML IV SOSY
PREFILLED_SYRINGE | INTRAVENOUS | Status: DC | PRN
  Administered 2020-08-05: 60 mg via INTRAVENOUS

## 2020-08-05 MED ORDER — SODIUM CHLORIDE (PF) 0.9 % IJ SOLN
INTRAMUSCULAR | Status: AC
  Filled 2020-08-05: qty 50

## 2020-08-05 MED ORDER — FENTANYL CITRATE (PF) 100 MCG/2ML IJ SOLN
INTRAMUSCULAR | Status: AC
  Administered 2020-08-05: 25 ug via INTRAVENOUS
  Filled 2020-08-05: qty 2

## 2020-08-05 MED ORDER — CEFAZOLIN SODIUM-DEXTROSE 2-4 GM/100ML-% IV SOLN
INTRAVENOUS | Status: AC
  Filled 2020-08-05: qty 100

## 2020-08-05 MED ORDER — CHLORHEXIDINE GLUCONATE 0.12 % MT SOLN
OROMUCOSAL | Status: AC
  Administered 2020-08-05: 15 mL via OROMUCOSAL
  Filled 2020-08-05: qty 15

## 2020-08-05 MED ORDER — CEFAZOLIN SODIUM-DEXTROSE 2-4 GM/100ML-% IV SOLN
2.0000 g | INTRAVENOUS | Status: AC
  Administered 2020-08-05: 2 g via INTRAVENOUS

## 2020-08-05 MED ORDER — MIDAZOLAM HCL 2 MG/2ML IJ SOLN
INTRAMUSCULAR | Status: AC
  Filled 2020-08-05: qty 2

## 2020-08-05 MED ORDER — PROPOFOL 10 MG/ML IV BOLUS
INTRAVENOUS | Status: AC
  Filled 2020-08-05: qty 20

## 2020-08-05 MED ORDER — FENTANYL CITRATE (PF) 100 MCG/2ML IJ SOLN
INTRAMUSCULAR | Status: DC | PRN
Start: 2020-08-05 — End: 2020-08-05
  Administered 2020-08-05 (×4): 25 ug via INTRAVENOUS

## 2020-08-05 MED ORDER — MIDAZOLAM HCL 2 MG/2ML IJ SOLN
INTRAMUSCULAR | Status: DC | PRN
  Administered 2020-08-05: 1 mg via INTRAVENOUS

## 2020-08-05 MED ORDER — BUPIVACAINE-EPINEPHRINE (PF) 0.25% -1:200000 IJ SOLN
INTRAMUSCULAR | Status: AC
  Filled 2020-08-05: qty 30

## 2020-08-05 MED ORDER — PROPOFOL 500 MG/50ML IV EMUL
INTRAVENOUS | Status: DC | PRN
  Administered 2020-08-05: 75 ug/kg/min via INTRAVENOUS

## 2020-08-05 MED ORDER — PROPOFOL 500 MG/50ML IV EMUL
INTRAVENOUS | Status: AC
  Filled 2020-08-05: qty 50

## 2020-08-05 MED ORDER — SODIUM CHLORIDE 0.9 % IV SOLN
INTRAVENOUS | Status: DC | PRN
  Administered 2020-08-05: 9 mL via INTRAMUSCULAR

## 2020-08-05 MED ORDER — LIDOCAINE HCL (PF) 2 % IJ SOLN
INTRAMUSCULAR | Status: AC
  Filled 2020-08-05: qty 5

## 2020-08-05 SURGICAL SUPPLY — 37 items
ADH SKN CLS APL DERMABOND .7 (GAUZE/BANDAGES/DRESSINGS) ×1
APL PRP STRL LF DISP 70% ISPRP (MISCELLANEOUS) ×1
BAG DECANTER FOR FLEXI CONT (MISCELLANEOUS) ×2 IMPLANT
BLADE SURG SZ11 CARB STEEL (BLADE) ×2 IMPLANT
BOOT SUTURE AID YELLOW STND (SUTURE) ×2 IMPLANT
CANISTER SUCT 1200ML W/VALVE (MISCELLANEOUS) ×2 IMPLANT
CHLORAPREP W/TINT 26 (MISCELLANEOUS) ×2 IMPLANT
COVER LIGHT HANDLE STERIS (MISCELLANEOUS) ×4 IMPLANT
COVER WAND RF STERILE (DRAPES) ×2 IMPLANT
DERMABOND ADVANCED (GAUZE/BANDAGES/DRESSINGS) ×1
DERMABOND ADVANCED .7 DNX12 (GAUZE/BANDAGES/DRESSINGS) ×1 IMPLANT
DRAPE C-ARM XRAY 36X54 (DRAPES) ×2 IMPLANT
ELECT CAUTERY BLADE TIP 2.5 (TIP) ×2
ELECT REM PT RETURN 9FT ADLT (ELECTROSURGICAL) ×2
ELECTRODE CAUTERY BLDE TIP 2.5 (TIP) ×1 IMPLANT
ELECTRODE REM PT RTRN 9FT ADLT (ELECTROSURGICAL) ×1 IMPLANT
GLOVE BIO SURGEON STRL SZ 6.5 (GLOVE) ×2 IMPLANT
GLOVE BIOGEL PI IND STRL 6.5 (GLOVE) ×1 IMPLANT
GLOVE BIOGEL PI INDICATOR 6.5 (GLOVE) ×1
GOWN STRL REUS W/ TWL LRG LVL3 (GOWN DISPOSABLE) ×3 IMPLANT
GOWN STRL REUS W/TWL LRG LVL3 (GOWN DISPOSABLE) ×6
IV NS 500ML (IV SOLUTION) ×2
IV NS 500ML BAXH (IV SOLUTION) ×1 IMPLANT
KIT PORT POWER 8FR ISP CVUE (Port) ×2 IMPLANT
KIT TURNOVER KIT A (KITS) ×2 IMPLANT
LABEL OR SOLS (LABEL) ×2 IMPLANT
NEEDLE FILTER BLUNT 18X 1/2SAF (NEEDLE) ×1
NEEDLE FILTER BLUNT 18X1 1/2 (NEEDLE) ×1 IMPLANT
PACK PORT-A-CATH (MISCELLANEOUS) ×2 IMPLANT
SUT MNCRL AB 4-0 PS2 18 (SUTURE) ×2 IMPLANT
SUT PROLENE 2 0 FS (SUTURE) ×2 IMPLANT
SUT VIC AB 2-0 SH 27 (SUTURE) ×2
SUT VIC AB 2-0 SH 27XBRD (SUTURE) ×1 IMPLANT
SUT VIC AB 3-0 SH 27 (SUTURE) ×2
SUT VIC AB 3-0 SH 27X BRD (SUTURE) ×1 IMPLANT
SYR 10ML LL (SYRINGE) ×2 IMPLANT
SYR 3ML LL SCALE MARK (SYRINGE) ×2 IMPLANT

## 2020-08-05 NOTE — Anesthesia Procedure Notes (Signed)
Date/Time: 08/05/2020 10:18 AM Performed by: Johnna Acosta, CRNA Pre-anesthesia Checklist: Patient identified, Emergency Drugs available, Suction available, Patient being monitored and Timeout performed Patient Re-evaluated:Patient Re-evaluated prior to induction Oxygen Delivery Method: Nasal cannula Preoxygenation: Pre-oxygenation with 100% oxygen Induction Type: IV induction

## 2020-08-05 NOTE — Progress Notes (Signed)
Virtual Visit via Telephone Note  I connected with Shannon Schroeder on 08/05/20 at  1:45 PM EDT by telephone and verified that I am speaking with the correct person using two identifiers.   I discussed the limitations, risks, security and privacy concerns of performing an evaluation and management service by telephone and the availability of in person appointments. I also discussed with the patient that there may be a patient responsible charge related to this service. The patient expressed understanding and agreed to proceed.   History of Present Illness: Shannon Schroeder is a 64 y.o. 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  MRI of the brain on 07/17/2020 revealed large right facial mass with right maxillary erosion and perineural spread along multiple branches of the maxillary division and right trigeminal nerve.  Patient was referred to palliative care to help address goals and manage ongoing symptoms such as pain.   Observations/Objective: I had a brief telephone visit with patient following her port insertion earlier today.  Patient reports that she is overall doing well.  She denies any significant changes or concerns.  Pain is reportedly stable.  Appetite is still chronically poor.  Patient is scheduled for chemo education tomorrow and will begin treatment with weekly cisplatin.  We will plan follow-up when patient seen back in the infusion center.  Assessment and Plan: H&N cancer -pending initiation of weekly cisplatin.  Patient had port placed today and will start treatment with XRT and chemotherapy later this week.  Will follow  Follow Up Instructions: 2-3 weeks   I discussed the assessment and treatment plan with the patient. The patient was provided an opportunity  to ask questions and all were answered. The patient agreed with the plan and demonstrated an understanding of the instructions.   The patient was advised to call back or seek an in-person evaluation if the symptoms worsen or if the condition fails to improve as anticipated.  I provided 5 minutes of non-face-to-face time during this encounter.   Irean Hong, NP

## 2020-08-05 NOTE — Interval H&P Note (Signed)
History and Physical Interval Note:  08/05/2020 9:59 AM  Shannon Schroeder  has presented today for surgery, with the diagnosis of C31.0 Maxillary sinus cancer.  The various methods of treatment have been discussed with the patient and family. After consideration of risks, benefits and other options for treatment, the patient has consented to  Procedure(s): INSERTION PORT-A-CATH (N/A) as a surgical intervention.  The patient's history has been reviewed, patient examined, no change in status, stable for surgery.  I have reviewed the patient's chart and labs.  Questions were answered to the patient's satisfaction.     Herbert Pun

## 2020-08-05 NOTE — Op Note (Signed)
SURGICAL PROCEDURE REPORT  DATE OF PROCEDURE: 08/05/2020   SURGEON: Dr. Windell Moment   ANESTHESIA: Local with light IV sedation   PRE-OPERATIVE DIAGNOSIS: Advanced maxillary sinus cancer requiring durable central venous access for chemotherapy   POST-OPERATIVE DIAGNOSIS: Advanced maxillary sinus cancer requiring durable central venous access for chemotherapy  PROCEDURE(S):  1.) Percutaneous access of Left internal jugular vein under ultrasound guidance 2.) Insertion of tunneled Left internal jugular central venous catheter with subcutaneous port  INTRAOPERATIVE FINDINGS: Patent easily compressible Left internal jugular vein with appropriate respiratory variations and well-secured tunneled central venous catheter with subcutaneous port at completion of the procedure  ESTIMATED BLOOD LOSS: Minimal (<20 mL)   SPECIMENS: None   IMPLANTS: 74F tunneled Bard PowerPort central venous catheter with subcutaneous port  DRAINS: None   COMPLICATIONS: None apparent   CONDITION AT COMPLETION: Hemodynamically stable, awake   DISPOSITION: PACU   INDICATION(S) FOR PROCEDURE:  Patient is a 64 y.o. female who presented with advanced maxillary sinus cancer requiring durable central venous access for chemotherapy. All risks, benefits, and alternatives to above elective procedures were discussed with the patient, who elected to proceed, and informed consent was accordingly obtained at that time.  DETAILS OF PROCEDURE:  Patient was brought to the operative suite and appropriately identified. In Trendelenburg position, Left IJ venous access site was prepped and draped in the usual sterile fashion, and following a brief timeout, percutaneous Left IJ venous access was obtained under ultrasound guidance using Seldinger technique, by which local anesthetic was injected over the Left IJ vein, and access needle was inserted under direct ultrasound visualization into the Left IJ vein, through which soft  guidewire was advanced, over which access needle was withdrawn. Guidewire was secured, attention was directed to injection of local anesthetic along the planned tunnel site, 2-3 cm transverse Left chest incision was made and confirmed to accommodate the subcutaneous port, and flushed catheter was tunneled retrograde from the port site over the Left chest to the Left IJ access site with the attached port well-secured to the catheter and within the subcutaneous pocket. Insertion sheath was advanced over the guidewire, which was withdrawn along with the insertion sheath dilator. The catheter was introduced through the sheath and left on the [place] junction under fluoro guidance and catheter cut to desire lenght. Catheter connected to port and fixed to the pocket on two side to avoid twisting. Port was confirmed to withdraw blood and flush easily, after which concentrated heparin was instilled into the port and catheter. Dermis at the subcutaneous pocket was re-approximated using buried interrupted 3-0 Vicryl suture, and 4-0 Monocryl suture was used to re-approximate skin at the insertion and subcutaneous port sites in running subcuticular fashion for the subcutaneous port and buried interrupted fashion for the insertion site. Skin was cleaned, dried, and sterile skin glue was applied. Patient was then safely transferred to PACU for a chest x-ray. Ultrasound images are available on paper chart and Fluoroscopy guidance images are available in Epic.

## 2020-08-05 NOTE — Anesthesia Postprocedure Evaluation (Signed)
Anesthesia Post Note  Patient: Shannon Schroeder  Procedure(s) Performed: INSERTION PORT-A-CATH (Left )  Patient location during evaluation: PACU Anesthesia Type: General Level of consciousness: awake and alert and oriented Pain management: pain level controlled Vital Signs Assessment: post-procedure vital signs reviewed and stable Respiratory status: spontaneous breathing Cardiovascular status: blood pressure returned to baseline Anesthetic complications: no   No complications documented.   Last Vitals:  Vitals:   08/05/20 1204 08/05/20 1224  BP: (!) 148/63 133/64  Pulse: 64 61  Resp: 14 16  Temp: 36.8 C   SpO2: 98% 98%    Last Pain:  Vitals:   08/05/20 1224  TempSrc:   PainSc: 0-No pain                 Deneka Greenwalt

## 2020-08-05 NOTE — Anesthesia Preprocedure Evaluation (Signed)
Anesthesia Evaluation  Patient identified by MRN, date of birth, ID band Patient awake    Reviewed: Allergy & Precautions, NPO status , Patient's Chart, lab work & pertinent test results, reviewed documented beta blocker date and time   Airway Mallampati: III  TM Distance: >3 FB     Dental  (+) Chipped   Pulmonary COPD, Current Smoker,           Cardiovascular hypertension, Pt. on medications + dysrhythmias Ventricular Tachycardia      Neuro/Psych  Headaches, Seizures -,  PSYCHIATRIC DISORDERS Anxiety Depression Bipolar Disorder TIA   GI/Hepatic negative GI ROS, Neg liver ROS,   Endo/Other  negative endocrine ROS  Renal/GU negative Renal ROS     Musculoskeletal  (+) Arthritis ,   Abdominal   Peds negative pediatric ROS (+)  Hematology negative hematology ROS (+)   Anesthesia Other Findings Past Medical History: 06/29/2018: Aneurysm of anterior cerebral artery     Comment:  Receiving care and treatment at [facility].  No date: Anxiety No date: Arthritis No date: Bipolar disorder (HCC) No date: COPD (chronic obstructive pulmonary disease) (HCC)     Comment:  NO INHALERS No date: Depression No date: Dysrhythmia     Comment:  H/O V TACH 07/29/2020: Head and neck cancer (HCC) No date: Headache     Comment:  H/O MIGRAINES No date: Hypertension No date: Seizures ([place])     Comment:  X1 AFTER FALL  Reproductive/Obstetrics                             Anesthesia Physical  Anesthesia Plan  ASA: III  Anesthesia Plan: General   Post-op Pain Management:    Induction: Intravenous  PONV Risk Score and Plan: TIVA  Airway Management Planned: Nasal Cannula  Additional Equipment:   Intra-op Plan:   Post-operative Plan:   Informed Consent: I have reviewed the patients History and Physical, chart, labs and discussed the procedure including the risks, benefits and alternatives for the proposed  anesthesia with the patient or authorized representative who has indicated his/her understanding and acceptance.       Plan Discussed with: CRNA  Anesthesia Plan Comments:         Anesthesia Quick Evaluation

## 2020-08-05 NOTE — Discharge Instructions (Addendum)
°  Diet: Resume home heart healthy regular diet.  ° °Activity: Increase activity as tolerated. Light activity and walking are encouraged. Do not drive or drink alcohol if taking narcotic pain medications. ° °Wound care: May shower with soapy water and pat dry (do not rub incisions), but no baths or submerging incision underwater until follow-up. (no swimming)  ° °Medications: Resume all home medications. For mild to moderate pain: acetaminophen (Tylenol) or ibuprofen (if no kidney disease). Combining Tylenol with alcohol can substantially increase your risk of causing liver disease. Narcotic pain medications, if prescribed, can be used for severe pain, though may cause nausea, constipation, and drowsiness. Do not combine Tylenol and Norco within a 6 hour period as Norco contains Tylenol. If you do not need the narcotic pain medication, you do not need to fill the prescription. ° °Call office ([phone]) at any time if any questions, worsening pain, fevers/chills, bleeding, drainage from incision site, or other concerns. ° °AMBULATORY SURGERY  °DISCHARGE INSTRUCTIONS ° ° °1) The drugs that you were given will stay in your system until tomorrow so for the next 24 hours you should not: ° °A) Drive an automobile °B) Make any legal decisions °C) Drink any alcoholic beverage ° ° °2) You may resume regular meals tomorrow.  Today it is better to start with liquids and gradually work up to solid foods. ° °You may eat anything you prefer, but it is better to start with liquids, then soup and crackers, and gradually work up to solid foods. ° ° °3) Please notify your doctor immediately if you have any unusual bleeding, trouble breathing, redness and pain at the surgery site, drainage, fever, or pain not relieved by medication. ° ° ° °4) Additional Instructions: ° ° ° ° ° ° ° °Please contact your physician with any problems or Same Day Surgery at [phone], Monday through Friday 6 am to 4 pm, or [place] at [place] [facility]  number at [phone]. °

## 2020-08-05 NOTE — Transfer of Care (Signed)
Immediate Anesthesia Transfer of Care Note  Patient: Shannon Schroeder  Procedure(s) Performed: INSERTION PORT-A-CATH (Left )  Patient Location: PACU  Anesthesia Type:General  Level of Consciousness: awake, alert  and oriented  Airway & Oxygen Therapy: Patient Spontanous Breathing and Patient connected to nasal cannula oxygen  Post-op Assessment: Report given to RN and Post -op Vital signs reviewed and stable  Post vital signs: Reviewed and stable  Last Vitals:  Vitals Value Taken Time  BP 145/73 08/05/20 1124  Temp 36.3 C 08/05/20 1124  Pulse 62 08/05/20 1133  Resp 12 08/05/20 1133  SpO2 100 % 08/05/20 1133  Vitals shown include unvalidated device data.  Last Pain:  Vitals:   08/05/20 1124  TempSrc:   PainSc: 0-No pain         Complications: No complications documented.

## 2020-08-06 ENCOUNTER — Encounter: Payer: Self-pay | Admitting: General Surgery

## 2020-08-06 ENCOUNTER — Inpatient Hospital Stay (HOSPITAL_BASED_OUTPATIENT_CLINIC_OR_DEPARTMENT_OTHER): Payer: Medicaid Other | Admitting: Oncology

## 2020-08-06 ENCOUNTER — Encounter: Payer: Self-pay | Admitting: Oncology

## 2020-08-06 ENCOUNTER — Inpatient Hospital Stay: Payer: Medicaid Other

## 2020-08-06 DIAGNOSIS — C760 Malignant neoplasm of head, face and neck: Secondary | ICD-10-CM

## 2020-08-06 DIAGNOSIS — Z7189 Other specified counseling: Secondary | ICD-10-CM | POA: Insufficient documentation

## 2020-08-06 DIAGNOSIS — Z510 Encounter for antineoplastic radiation therapy: Secondary | ICD-10-CM | POA: Diagnosis not present

## 2020-08-06 NOTE — Progress Notes (Signed)
[place]  [phone] [phone]  Patient Care Team: Pcp, No as PCP - Josem Kaufmann, MD as Consulting Physician (Oncology)   Name of the patient: Shannon Schroeder  [id number]  1956/06/29   Date of visit: 08/06/20  Diagnosis-head and neck cancer  Chief complaint/Reason for visit- Initial Meeting for [facility], preparing for starting chemotherapy  Heme/Onc history:  Oncology History  Head and neck cancer ([place])  07/29/2020 Initial Diagnosis   Head and neck cancer ([place])   08/08/2020 - 08/08/2020 Chemotherapy   The patient had dexamethasone (DECADRON) 4 MG tablet, 8 mg, Oral, Daily, 0 of 1 cycle, Start date: --, End date: -- palonosetron (ALOXI) injection 0.25 mg, 0.25 mg, Intravenous,  Once, 0 of 3 cycles CISplatin (PLATINOL) 166 mg in sodium chloride 0.9 % 500 mL chemo infusion, 100 mg/m2, Intravenous,  Once, 0 of 3 cycles fosaprepitant (EMEND) 150 mg in sodium chloride 0.9 % 145 mL IVPB, 150 mg, Intravenous,  Once, 0 of 3 cycles  for chemotherapy treatment.    08/08/2020 -  Chemotherapy   The patient had palonosetron (ALOXI) injection 0.25 mg, 0.25 mg, Intravenous,  Once, 0 of 7 cycles CISplatin (PLATINOL) 66 mg in sodium chloride 0.9 % 250 mL chemo infusion, 40 mg/m2, Intravenous,  Once, 0 of 7 cycles fosaprepitant (EMEND) 150 mg in sodium chloride 0.9 % 145 mL IVPB, 150 mg, Intravenous,  Once, 0 of 7 cycles  for chemotherapy treatment.      Interval history-Shannon Schroeder is a 64 year old female who presents to chemo care clinic today for initial meeting in preparation for starting chemotherapy. I introduced the chemo care clinic and we discussed that the role of the clinic is to assist those who are at an increased risk of emergency room visits and/or complications during the course of chemotherapy treatment. We discussed that the increased risk takes into account factors such as age,  performance status, and co-morbidities. We also discussed that for some, this might include barriers to care such as not having a primary care provider, lack of insurance/transportation, or not being able to afford medications. We discussed that the goal of the program is to help prevent unplanned ER visits and help reduce complications during chemotherapy. We do this by discussing specific risk factors to each individual and identifying ways that we can help improve these risk factors and reduce barriers to care.   ECOG FS:1 - Symptomatic but completely ambulatory  Review of systems- Review of Systems  Constitutional: Negative.  Negative for chills, fever, malaise/fatigue and weight loss.  HENT: Positive for sore throat. Negative for congestion, ear pain and tinnitus.        Mouth pain  Eyes: Negative.  Negative for blurred vision and double vision.  Respiratory: Negative.  Negative for cough, sputum production and shortness of breath.   Cardiovascular: Negative.  Negative for chest pain, palpitations and leg swelling.  Gastrointestinal: Negative.  Negative for abdominal pain, constipation, diarrhea, nausea and vomiting.  Genitourinary: Negative for dysuria, frequency and urgency.  Musculoskeletal: Negative for back pain and falls.  Skin: Negative.  Negative for rash.  Neurological: Negative.  Negative for weakness and headaches.  Endo/Heme/Allergies: Negative.  Does not bruise/bleed easily.  Psychiatric/Behavioral: Negative.  Negative for depression. The patient is not nervous/anxious and does not have insomnia.      Current treatment-beginning cisplatin on 08/08/2020 along with XRT  Allergies  Allergen Reactions  . Aspirin Swelling and Other (See Comments)  .  Belladonna Alkaloids Rash  . Fluoxetine Hcl Other (See Comments) and Rash  . Naproxen Swelling, Rash and Other (See Comments)  . Phenobarbital Rash and Other (See Comments)  . Prozac [Fluoxetine Hcl] Rash    Past Medical  History:  Diagnosis Date  . 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   Marland Kitchen Anxiety   . Arthritis   . Bipolar disorder ([place])   . COPD (chronic obstructive pulmonary disease) ([place])    NO INHALERS  . Depression   . Dysrhythmia    H/O V TACH  . Head and neck cancer ([place]) 07/29/2020  . Headache    H/O MIGRAINES  . Hypertension   . Seizures ([place])    X1 AFTER FALL    Past Surgical History:  Procedure Laterality Date  . ABDOMINAL HYSTERECTOMY     partial  . BREAST SURGERY     biopsy  . CARDIAC CATHETERIZATION     X 2  . CARPAL TUNNEL RELEASE Right   . CARPECTOMY HAND Right   . CATARACT EXTRACTION W/PHACO Left 03/08/2018   Procedure: CATARACT EXTRACTION PHACO AND INTRAOCULAR LENS PLACEMENT (IOC);  Surgeon: Birder Robson, MD;  Location: [facility] ORS;  Service: Ophthalmology;  Laterality: Left;  [place] 00:55 AP% 13.5 CDE 7.52 Fluid pack lot # [id number]  . CATARACT EXTRACTION W/PHACO Right 04/05/2018   Procedure: CATARACT EXTRACTION PHACO AND INTRAOCULAR LENS PLACEMENT (IOC);  Surgeon: Birder Robson, MD;  Location: [facility] ORS;  Service: Ophthalmology;  Laterality: Right;  [place] 00:36 AP% 15.9 CDE 5.72 Fluid pack lot # [id number]  . CEREBRAL ANEURYSM REPAIR    . CHOLECYSTECTOMY    . COLONOSCOPY WITH PROPOFOL N/A 04/26/2019   Procedure: COLONOSCOPY WITH PROPOFOL;  Surgeon: Jonathon Bellows, MD;  Location: [facility] ENDOSCOPY;  Service: Gastroenterology;  Laterality: N/A;  . TUBAL LIGATION      Social History   Socioeconomic History  . Marital status: Divorced    Spouse name: Not on file  . Number of children: Not on file  . Years of education: 59  . Highest education level: Bachelor's degree (e.g., BA, AB, BS)  Occupational History  . Occupation: farmer  Tobacco Use  . Smoking status: Current Every Day Smoker    Packs/day: 1.00    Years: 50.00    Pack years: 50.00    Types: Cigarettes  . Smokeless tobacco: Never Used  Vaping Use  . Vaping Use:  Never used  Substance and Sexual Activity  . Alcohol use: No  . Drug use: No  . Sexual activity: Not Currently  Other Topics Concern  . Not on file  Social History Narrative   Pt lives at [facility].    Social Determinants of Health   Financial Resource Strain:   . Difficulty of Paying Living Expenses: Not on file  Food Insecurity:   . Worried About Charity fundraiser in the Last Year: Not on file  . Ran Out of Food in the Last Year: Not on file  Transportation Needs:   . Lack of Transportation (Medical): Not on file  . Lack of Transportation (Non-Medical): Not on file  Physical Activity:   . Days of Exercise per Week: Not on file  . Minutes of Exercise per Session: Not on file  Stress:   . Feeling of Stress : Not on file  Social Connections:   . Frequency of Communication with Friends and Family: Not on file  . Frequency of Social Gatherings with Friends and  Family: Not on file  . Attends Religious Services: Not on file  . Active Member of Clubs or Organizations: Not on file  . Attends Archivist Meetings: Not on file  . Marital Status: Not on file  Intimate Partner Violence:   . Fear of Current or Ex-Partner: Not on file  . Emotionally Abused: Not on file  . Physically Abused: Not on file  . Sexually Abused: Not on file    Family History  Problem Relation Age of Onset  . Hypertension Mother   . Heart failure Mother   . Hypertension Brother   . Stroke Brother   . Hypertension Son   . Emphysema Maternal Aunt   . Hypertension Paternal Aunt   . Hypertension Paternal Uncle   . Hypertension Maternal Grandmother   . Hypertension Paternal Grandmother      Current Outpatient Medications:  .  acetaminophen (TYLENOL 8 HOUR ARTHRITIS PAIN) 650 MG CR tablet, Take 1,300 mg by mouth daily as needed (Pain). , Disp: , Rfl:  .  b complex vitamins capsule, Take 1 capsule by mouth daily. Gummie, Disp: , Rfl:  .  fentaNYL (DURAGESIC) 25 MCG/HR, Place 1 patch  onto the skin every 3 (three) days., Disp: 5 patch, Rfl: 0 .  gabapentin (NEURONTIN) 300 MG capsule, Take 1 capsule (300 mg total) by mouth 3 (three) times daily., Disp: 90 capsule, Rfl: 0 .  Ginkgo Biloba (GINKOBA PO), Take 1 tablet by mouth daily. , Disp: , Rfl:  .  HYDROcodone-acetaminophen (NORCO) 5-325 MG tablet, Take 1 tablet by mouth every 4 (four) hours as needed for up to 3 days for moderate pain., Disp: 10 tablet, Rfl: 0 .  ibuprofen (ADVIL) 400 MG tablet, Take 400 mg by mouth every 6 (six) hours as needed for mild pain or moderate pain. Liquid Gel, Disp: , Rfl:  .  lidocaine-prilocaine (EMLA) cream, Apply to affected area once (Patient taking differently: Apply 1 application topically as needed [facility]). Apply to affected area once), Disp: 30 g, Rfl: 3 .  lisinopril (ZESTRIL) 20 MG tablet, Take 1 tablet (20 mg total) by mouth daily. (Patient taking differently: Take 30 mg by mouth every evening. ), Disp: 30 tablet, Rfl: 0 .  lisinopril (ZESTRIL) 30 MG tablet, Take 30 mg by mouth daily., Disp: , Rfl:  .  Melatonin 10 MG TABS, Take 10 mg by mouth at bedtime as needed (sleep). , Disp: , Rfl:  .  Multiple Vitamins-Minerals (CENTRUM SILVER PO), Take 1 tablet by mouth daily. Gummie, Disp: , Rfl:  .  oxyCODONE-acetaminophen (PERCOCET) 5-325 MG tablet, Take 1 tablet by mouth every 4 (four) hours as needed for severe pain., Disp: 30 tablet, Rfl: 0 .  prochlorperazine (COMPAZINE) 10 MG tablet, Take 1 tablet (10 mg total) by mouth every 6 (six) hours as needed (Nausea or vomiting)., Disp: 30 tablet, Rfl: 1  Physical exam: There were no vitals filed for this visit. Physical Exam Constitutional:      Appearance: Normal appearance.  HENT:     Head: Normocephalic and atraumatic.  Eyes:     Pupils: Pupils are equal, round, and reactive to light.  Cardiovascular:     Rate and Rhythm: Normal rate and regular rhythm.     Heart sounds: Normal heart sounds. No murmur heard.   Pulmonary:     Effort:  Pulmonary effort is normal.     Breath sounds: Normal breath sounds. No wheezing.  Abdominal:     General: Bowel sounds are normal. There  is no distension.     Palpations: Abdomen is soft.     Tenderness: There is no abdominal tenderness.  Musculoskeletal:        General: Normal range of motion.     Cervical back: Normal range of motion.  Skin:    General: Skin is warm and dry.     Findings: No rash.  Neurological:     Mental Status: She is alert and oriented to person, place, and time.  Psychiatric:        Judgment: Judgment normal.      CMP Latest Ref Rng & Units 07/12/2020  Glucose 70 - 99 mg/dL 114(H)  BUN 8 - 23 mg/dL 8  Creatinine 0.44 - 1.00 mg/dL 0.42(L)  Sodium 135 - 145 mmol/L 137  Potassium 3.5 - 5.1 mmol/L 4.0  Chloride 98 - 111 mmol/L 98  CO2 22 - 32 mmol/L 27  Calcium 8.9 - 10.3 mg/dL 9.0  Total Protein 6.5 - 8.1 g/dL -  Total Bilirubin 0.3 - 1.2 mg/dL -  Alkaline Phos 38 - 126 U/L -  AST 15 - 41 U/L -  ALT 0 - 44 U/L -   CBC Latest Ref Rng & Units 07/10/2020  WBC 4.0 - 10.5 K/uL 14.1(H)  Hemoglobin 12.0 - 15.0 g/dL 12.0  Hematocrit 36 - 46 % 35.9(L)  Platelets 150 - 400 K/uL 383    No images are attached to the encounter.  MR Brain W Wo Contrast  Result Date: 07/17/2020 CLINICAL DATA:  Right-sided facial pain for 4-5 months. Facial tumor. Possible perineural spread. EXAM: MRI HEAD WITHOUT AND WITH CONTRAST TECHNIQUE: Multiplanar, multiecho pulse sequences of the brain and surrounding structures were obtained without and with intravenous contrast. CONTRAST:  107mL GADAVIST GADOBUTROL 1 MMOL/ML IV SOLN COMPARISON:  Maxillofacial CT 07/10/2020 FINDINGS: Brain: No acute infarct, acute hemorrhage or extra-axial collection. Multifocal hyperintense T2-weighted signal within the white matter. Normal volume of CSF spaces. No chronic microhemorrhage. Normal midline structures. There is abnormal thickening and contrast enhancement of the maxillary branch of the right  trigeminal nerve within the right Meckel cave and foramen rotundum. Abnormal enhancement extends to the pterygopalatine fossa and along the lateral posterior aspect of the right maxillary sinus. There is also abnormal enhancement extending into the right pterygoid muscles. Vascular: Normal flow voids. Skull and upper cervical spine: Large right facial mass erodes through the right maxilla including the floor of the maxillary sinus, the right half of the hard palate and the right maxillary alveolar process. Sinuses/Orbits: Erosive mass involving the right maxillary sinus. Otherwise, the paranasal sinuses are clear. There is abnormal enhancement along the right infraorbital nerve. Other: None IMPRESSION: 1. Large right facial mass with right maxillary erosion involving the maxillary sinus, hard palate and pterygoid plates and muscles. 2. Perineural spread along multiple branches of the maxillary division of the right trigeminal nerve at the infraorbital canal, pterygopalatine fossa and foramen rotundum. Electronically Signed   By: Ulyses Jarred M.D.   On: 07/17/2020 20:04   NM PET Image Initial (PI) Skull Base To Thigh  Result Date: 07/17/2020 CLINICAL DATA:  Initial treatment strategy for head and neck cancer. EXAM: NUCLEAR MEDICINE PET SKULL BASE TO THIGH TECHNIQUE: 7.45 mCi F-18 FDG was injected intravenously. Full-ring PET imaging was performed from the skull base to thigh after the radiotracer. CT data was obtained and used for attenuation correction and anatomic localization. Fasting blood glucose: 102 mg/dl COMPARISON:  CT maxillofacial with contrast 07/10/2020 FINDINGS: Mediastinal blood pool activity: SUV  max 1.99 Liver activity: SUV max NA NECK: The previously characterized soft tissue mass involving the right maxilla, right buccal space, floor of right nasal cavity and right pterygoid palatine fossa, right orbital apex, right soft palate and right palatine tonsil is intensely FDG avid and has an SUV max  of 13.74. The recently described suspicious, 7 mm right level 1a lymph node has an SUV max of 2.12. Incidental CT findings: none CHEST: No hypermetabolic mediastinal or hilar nodes. No suspicious pulmonary nodules on the CT scan. Incidental CT findings: Aortic atherosclerosis. ABDOMEN/PELVIS: No abnormal hypermetabolic activity within the liver, pancreas, adrenal glands, or spleen. No hypermetabolic lymph nodes in the abdomen or pelvis. Incidental CT findings: Cholecystectomy.  Aortic atherosclerosis. SKELETON: No signs of FDG avid distant osseous metastasis. Incidental CT findings: none IMPRESSION: 1. Intense FDG uptake is associated with the large enhancing tumor which involves the right maxilla, right nasal cavity, right pterygoid palatine fossa, right orbital apex and right soft tissue palate and palatine tonsil. 2. Mild nonspecific FDG uptake is associated with the recently characterized suspicious right level 1A lymph node which measures 7 mm and has an SUV max of 2.12. 3. No signs of FDG avid distant metastatic disease. 4.  Aortic Atherosclerosis (ICD10-I70.0). Electronically Signed   By: Kerby Moors M.D.   On: 07/17/2020 15:26   CT Maxillofacial W Contrast  Result Date: 07/10/2020 CLINICAL DATA:  64 year old female with right side facial swelling and pain. Discolored areas of oral mucosa on that side. EXAM: CT MAXILLOFACIAL WITH CONTRAST TECHNIQUE: Multidetector CT imaging of the maxillofacial structures was performed with intravenous contrast. Multiplanar CT image reconstructions were also generated. CONTRAST:  93mL OMNIPAQUE IOHEXOL 300 MG/ML  SOLN COMPARISON:  CT head and CTA head and neck 06/15/2018. FINDINGS: Osseous: New since 2019, erosion throughout the floor of the right maxillary sinus, the right half of the hard palate (series 7, image 26) and the entire right maxillary alveolar process. The right maxillary sinus was clear in 2019, but now demonstrates a combination of low-density and  enhancing soft tissue, presumably tumor, which infiltrates through the eroded floor of the right maxilla and to the right buccal space and oral cavity (series 6, image 31). The soft tissue abnormality extends throughout much of the floor of the right nasal cavity. The right pterygoid plates are also partially eroded. The abnormal enhancing soft tissue has indistinct margins but encompasses an area of roughly 5 cm (series 2, image 47, series 6, image 31). The right pterygoid palatine fossa is infiltrated (series 2, image 35). The posterior margin of tumor is in proximity to the right mandible on series 2, image 52, but the mandible and TMJ appear spared. The clivus and remaining central skull base also appear intact. Right frontotemporal craniotomy changes are new since 2019. No other bone erosion or suspicious bone lesion identified. Orbits: The right orbital floor remains intact but is abutted by abnormal soft tissue in the right maxillary sinus. There may be tumor at the right orbital apex tracking cephalad from the pterygoid palatine fossa, but otherwise no mass or inflammation within the right orbit. The other orbital walls are intact. The left orbit also appears negative. Sinuses: Destruction of the right maxilla with abnormal enhancing and low-density masslike soft tissue within the sinus in involving the floor of the right nasal cavity as stated above. Elsewhere the nasal cavity and other paranasal sinuses seem to remain normal. Mild retained secretions and/or mucosal thickening suspected in the right sphenoid sinus. The tympanic cavities  and mastoids are clear. Soft tissues: Bulky masslike soft tissue centered at the right maxillary alveolus and involving the right buccal space and oral cavity (series 2, image 55). Involvement of the anterior right pterygoid musculature. The remaining right masticator space appears spared. In addition to cephalad spread to the right pterygoid palatine fossa there appears to  be contiguous tumor along the soft palate and right tonsillar pillar on series 2, image 48. The remaining pharyngeal contours are within normal limits. The right parapharyngeal and retropharyngeal spaces remain within normal limits. Central sublingual space, submandibular glands and parotid glands are within normal limits. Small but asymmetric 7 mm right level 2A lymph node on series 2, image 70. On image 69 in the left lateral sublingual space there is an indeterminate 7 mm enhancing soft tissue nodule. This seems too posterior for the left sublingual gland. Bilateral level 2 lymph nodes measure 7-9 mm short axis and appears symmetric. The visible major vascular structures in the upper neck appear patent. Limited intracranial: The right cavernous sinus seems to remain patent. However, there is a subtle asymmetric enhancing soft tissue nodule measuring 4-5 mm at the right foramen rotundum and abutting the cavernous sinus on series 6, image 46. Definite intracranial 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 IMPRESSION: 1. Bulky and indistinct enhancing tumor which has aggressively destroyed the right maxilla, infiltrates along the right buccal space, floor of the right nasal cavity, into the right pterygoid palatine fossa, the right orbital apex, and also the right soft palate and palatine tonsil. Furthermore, there is evidence of intracranial extension along the right V2 at the foramen rotundum. 2. Small but suspicious right level 1A lymph node measuring 7 mm. And there is a separate small indeterminate but suspicious enhancing soft tissue nodule of the left sublingual space. Electronically Signed   By: Genevie Ann M.D.   On: 07/10/2020 16:26   DG Chest Port 1 View  Result Date: 08/05/2020 CLINICAL DATA:  Status post Port-A-Cath placement EXAM: PORTABLE CHEST 1 VIEW COMPARISON:  None. FINDINGS: Left-sided Port-A-Cath with the tip projecting over the SVC. No focal consolidation. No pleural effusion  or pneumothorax. Heart and mediastinal contours are unremarkable. No acute osseous abnormality. IMPRESSION: Left-sided Port-A-Cath with the tip projecting over the SVC. Electronically Signed   By: Kathreen Devoid   On: 08/05/2020 11:52   DG C-Arm 1-60 Min-No Report  Result Date: 08/05/2020 Fluoroscopy was utilized by the requesting physician.  No radiographic interpretation.     Assessment and plan- Patient is a 64 y.o. female who presents to [facility] for initial meeting in preparation for starting chemotherapy for the treatment of head and neck cancer.   1. UDJ:SHFWYOV Ainsworthis a 64 y.o.femalewith multiple medical problems including COPD, bipolar, history of TIA, who was found to have a progressively worsening oral lesion with CT ultimately revealing alargetumor of the right maxilla with extensive infiltration into the surrounding tissues. MRI of the brain on 07/17/2020 revealed large right facial mass with right maxillary erosion and perineural spread along multiple branches of the maxillary division and right trigeminal nerve.  Unfortunately, likely not resectable.  She is followed by Dr. Elisabeth Pigeon.  She will also see Dr. Donella Stade for radiation.  She is followed by Dr. Tasia Catchings and will begin chemotherapy with cisplatin in the upcoming week.  She is scheduled to begin radiation tomorrow.  2. Chemo Care Clinic/High Risk for ER/Hospitalization during chemotherapy- We discussed the role of the chemo care clinic and identified patient specific  risk factors. I discussed that patient was identified as high risk primarily based on: Age and comorbidities.  Patient has past medical history positive for: Past Medical History:  Diagnosis Date  . 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   Marland Kitchen Anxiety   . Arthritis   . Bipolar disorder ([place])   . COPD (chronic obstructive pulmonary disease) ([place])    NO INHALERS  . Depression   . Dysrhythmia    H/O V  TACH  . Head and neck cancer ([place]) 07/29/2020  . Headache    H/O MIGRAINES  . Hypertension   . Seizures ([place])    X1 AFTER FALL    Patient has past surgical history positive for: Past Surgical History:  Procedure Laterality Date  . ABDOMINAL HYSTERECTOMY     partial  . BREAST SURGERY     biopsy  . CARDIAC CATHETERIZATION     X 2  . CARPAL TUNNEL RELEASE Right   . CARPECTOMY HAND Right   . CATARACT EXTRACTION W/PHACO Left 03/08/2018   Procedure: CATARACT EXTRACTION PHACO AND INTRAOCULAR LENS PLACEMENT (IOC);  Surgeon: Birder Robson, MD;  Location: [facility] ORS;  Service: Ophthalmology;  Laterality: Left;  [place] 00:55 AP% 13.5 CDE 7.52 Fluid pack lot # [id number]  . CATARACT EXTRACTION W/PHACO Right 04/05/2018   Procedure: CATARACT EXTRACTION PHACO AND INTRAOCULAR LENS PLACEMENT (IOC);  Surgeon: Birder Robson, MD;  Location: [facility] ORS;  Service: Ophthalmology;  Laterality: Right;  [place] 00:36 AP% 15.9 CDE 5.72 Fluid pack lot # [id number]  . CEREBRAL ANEURYSM REPAIR    . CHOLECYSTECTOMY    . COLONOSCOPY WITH PROPOFOL N/A 04/26/2019   Procedure: COLONOSCOPY WITH PROPOFOL;  Surgeon: Jonathon Bellows, MD;  Location: [facility] ENDOSCOPY;  Service: Gastroenterology;  Laterality: N/A;  . TUBAL LIGATION      Based on our high risk symptom management report; this patient has a high risk of ED utilization.  The percentage below indicates how "at risk "  this patient based on the factors in this table within one year.   General Risk Score: 6  Values used to calculate this score:   Points  Metrics      1        Age: 71      1        Hospital Admissions: 1      2        ED Visits: 2      1        Has Chronic Obstructive Pulmonary Disease: Yes      0        Has Diabetes: No      0        Has Congestive Heart Failure: No      0        Has liver disease: No      0        Has Depression: No      0        Current PCP: No Pcp      1        Has Medicaid: Yes  3. We discussed that social determinants of health  may have significant impacts on health and outcomes for cancer patients.  Today we discussed specific social determinants of performance status, alcohol use, depression, financial needs, food insecurity, housing, interpersonal violence, social connections, stress, tobacco use, and transportation.    After lengthy discussion the following were identified as areas of  need:   Mouth pain-followed by palliative care.  Currently on Percocet every 4 hours as needed but unable to take secondary to still employed and working as a Scientist, water quality in [employer].  She is also taking gabapentin.  Starting radiation this week.  Outpatient services: We discussed options including home based and outpatient services, DME and care program. We discusssed that patients who participate in regular physical activity report fewer negative impacts of cancer and treatments and report less fatigue.   Financial Concerns: We discussed that living with cancer can create tremendous financial burden.  We discussed options for assistance. I asked that if assistance is needed in affording medications or paying bills to please let us know so that we can provide assistance. We discussed options for food including social services, [employer] ($50 every 2 weeks) and onsite food pantry.  We will also notify [facility]ab[facility] Lister [facility] to see if cancer center can provide additional support.  Referral to Social work: Introduced Education officer, museum Elease Etienne and the services he can provide such as support with [employer], cell phone and gas vouchers.   Support groups: We discussed options for support groups at the cancer center. If interested, please notify nurse navigator to enroll. We discussed options for managing stress including healthy eating, exercise as well as participating in no charge counseling services at the cancer center and support groups.  If these are of interest, patient can notify either myself or primary nursing team.We discussed  options for management including medications and referral to quit Smart program  Transportation: We discussed options for transportation including acta, paratransit, bus routes, link transit, taxi/uber/lyft, and cancer center [place].  I have notified primary oncology team who will help assist with arranging Lucianne Lei transportation for appointments when/if needed. We also discussed options for transportation on short notice/acute visits.  Palliative care services: We have palliative care services available in the cancer center to discuss goals of care and advanced care planning.  Please let us know if you have any questions or would like to speak to our palliative nurse practitioner.  Symptom Management Clinic: We discussed our symptom management clinic which is available for acute concerns while receiving treatment such as nausea, vomiting or diarrhea.  We can be reached via telephone at [phone] or through my chart.  We are available for virtual or in person visits on the same day from 830 to 4 PM Monday through Friday. She denies needing specific assistance at this time and She will be followed by Dr. Collie Siad clinical team.  Plan: Discussed symptom management clinic. Discussed palliative care services. Discussed resources that are available here at the cancer center. Discussed medications and new prescriptions to begin treatment such as anti-nausea or steroids.   Disposition: RTC on on 08/07/2020 to begin radiation. RTC on 08/08/2020 for MD assessment, labs and cycle 1 of cisplatin.  Visit Diagnosis No diagnosis found.  Patient expressed understanding and was in agreement with this plan. She also understands that She can call clinic at any time with any questions, concerns, or complaints.   Greater than 50% was spent in counseling and coordination of care with this patient including but not limited to discussion of the relevant topics above (See A&P) including, but not limited to diagnosis and management  of acute and chronic medical conditions.   [facility] [place]  CC: Dr. Tasia Catchings

## 2020-08-07 ENCOUNTER — Ambulatory Visit: Admission: RE | Admit: 2020-08-07 | Payer: Medicaid Other | Source: Ambulatory Visit

## 2020-08-07 NOTE — Telephone Encounter (Signed)
Called [facility] in [place] to follow up on path report results. Left message for them to call back and let us know.

## 2020-08-08 ENCOUNTER — Inpatient Hospital Stay: Payer: Medicaid Other

## 2020-08-08 ENCOUNTER — Observation Stay: Payer: Medicare Other

## 2020-08-08 ENCOUNTER — Encounter: Payer: Self-pay | Admitting: Emergency Medicine

## 2020-08-08 ENCOUNTER — Inpatient Hospital Stay (HOSPITAL_BASED_OUTPATIENT_CLINIC_OR_DEPARTMENT_OTHER): Payer: Medicaid Other | Admitting: Oncology

## 2020-08-08 ENCOUNTER — Other Ambulatory Visit: Payer: Self-pay

## 2020-08-08 ENCOUNTER — Inpatient Hospital Stay
Admission: EM | Admit: 2020-08-08 | Discharge: 2020-08-10 | DRG: 309 | Disposition: A | Discharge status: Home / Self Care | Payer: Medicare Other | Attending: Internal Medicine | Admitting: Internal Medicine

## 2020-08-08 ENCOUNTER — Emergency Department: Payer: Medicare Other

## 2020-08-08 ENCOUNTER — Ambulatory Visit
Admission: RE | Admit: 2020-08-08 | Discharge: 2020-08-08 | Disposition: A | Discharge status: Home / Self Care | Payer: Medicaid Other | Source: Ambulatory Visit | Attending: Radiation Oncology | Admitting: Radiation Oncology

## 2020-08-08 ENCOUNTER — Encounter: Payer: Self-pay | Admitting: Oncology

## 2020-08-08 ENCOUNTER — Telehealth: Payer: Self-pay | Admitting: Nurse Practitioner

## 2020-08-08 VITALS — BP 82/61 | HR 167 | Temp 97.8°F

## 2020-08-08 VITALS — BP 120/91 | HR 145 | Temp 96.9°F | Resp 20

## 2020-08-08 DIAGNOSIS — Z9841 Cataract extraction status, right eye: Secondary | ICD-10-CM

## 2020-08-08 DIAGNOSIS — E860 Dehydration: Secondary | ICD-10-CM | POA: Diagnosis not present

## 2020-08-08 DIAGNOSIS — R778 Other specified abnormalities of plasma proteins: Secondary | ICD-10-CM

## 2020-08-08 DIAGNOSIS — G459 Transient cerebral ischemic attack, unspecified: Secondary | ICD-10-CM

## 2020-08-08 DIAGNOSIS — Z90711 Acquired absence of uterus with remaining cervical stump: Secondary | ICD-10-CM

## 2020-08-08 DIAGNOSIS — C760 Malignant neoplasm of head, face and neck: Secondary | ICD-10-CM | POA: Diagnosis present

## 2020-08-08 DIAGNOSIS — I081 Rheumatic disorders of both mitral and tricuspid valves: Secondary | ICD-10-CM | POA: Diagnosis present

## 2020-08-08 DIAGNOSIS — Z886 Allergy status to analgesic agent status: Secondary | ICD-10-CM

## 2020-08-08 DIAGNOSIS — D630 Anemia in neoplastic disease: Secondary | ICD-10-CM | POA: Diagnosis present

## 2020-08-08 DIAGNOSIS — Z9842 Cataract extraction status, left eye: Secondary | ICD-10-CM

## 2020-08-08 DIAGNOSIS — Z9049 Acquired absence of other specified parts of digestive tract: Secondary | ICD-10-CM

## 2020-08-08 DIAGNOSIS — R6510 Systemic inflammatory response syndrome (SIRS) of non-infectious origin without acute organ dysfunction: Secondary | ICD-10-CM | POA: Diagnosis present

## 2020-08-08 DIAGNOSIS — Z888 Allergy status to other drugs, medicaments and biological substances status: Secondary | ICD-10-CM

## 2020-08-08 DIAGNOSIS — J449 Chronic obstructive pulmonary disease, unspecified: Secondary | ICD-10-CM | POA: Diagnosis present

## 2020-08-08 DIAGNOSIS — Z5111 Encounter for antineoplastic chemotherapy: Secondary | ICD-10-CM

## 2020-08-08 DIAGNOSIS — I959 Hypotension, unspecified: Secondary | ICD-10-CM

## 2020-08-08 DIAGNOSIS — I248 Other forms of acute ischemic heart disease: Secondary | ICD-10-CM | POA: Diagnosis present

## 2020-08-08 DIAGNOSIS — Z8249 Family history of ischemic heart disease and other diseases of the circulatory system: Secondary | ICD-10-CM

## 2020-08-08 DIAGNOSIS — Z825 Family history of asthma and other chronic lower respiratory diseases: Secondary | ICD-10-CM

## 2020-08-08 DIAGNOSIS — I471 Supraventricular tachycardia: Secondary | ICD-10-CM | POA: Diagnosis not present

## 2020-08-08 DIAGNOSIS — G893 Neoplasm related pain (acute) (chronic): Secondary | ICD-10-CM

## 2020-08-08 DIAGNOSIS — Z510 Encounter for antineoplastic radiation therapy: Secondary | ICD-10-CM | POA: Diagnosis not present

## 2020-08-08 DIAGNOSIS — Z8673 Personal history of transient ischemic attack (TIA), and cerebral infarction without residual deficits: Secondary | ICD-10-CM

## 2020-08-08 DIAGNOSIS — Z720 Tobacco use: Secondary | ICD-10-CM | POA: Diagnosis present

## 2020-08-08 DIAGNOSIS — E876 Hypokalemia: Secondary | ICD-10-CM

## 2020-08-08 DIAGNOSIS — Z823 Family history of stroke: Secondary | ICD-10-CM

## 2020-08-08 DIAGNOSIS — D72829 Elevated white blood cell count, unspecified: Secondary | ICD-10-CM | POA: Diagnosis present

## 2020-08-08 DIAGNOSIS — Z961 Presence of intraocular lens: Secondary | ICD-10-CM | POA: Diagnosis present

## 2020-08-08 DIAGNOSIS — R7989 Other specified abnormal findings of blood chemistry: Secondary | ICD-10-CM

## 2020-08-08 DIAGNOSIS — C801 Malignant (primary) neoplasm, unspecified: Secondary | ICD-10-CM

## 2020-08-08 DIAGNOSIS — Z79899 Other long term (current) drug therapy: Secondary | ICD-10-CM

## 2020-08-08 DIAGNOSIS — C310 Malignant neoplasm of maxillary sinus: Secondary | ICD-10-CM | POA: Diagnosis present

## 2020-08-08 DIAGNOSIS — R000 Tachycardia, unspecified: Secondary | ICD-10-CM

## 2020-08-08 DIAGNOSIS — I4819 Other persistent atrial fibrillation: Principal | ICD-10-CM | POA: Diagnosis present

## 2020-08-08 DIAGNOSIS — I4729 Other ventricular tachycardia: Secondary | ICD-10-CM

## 2020-08-08 DIAGNOSIS — F32A Depression, unspecified: Secondary | ICD-10-CM | POA: Diagnosis present

## 2020-08-08 DIAGNOSIS — Z79890 Hormone replacement therapy (postmenopausal): Secondary | ICD-10-CM

## 2020-08-08 DIAGNOSIS — F17210 Nicotine dependence, cigarettes, uncomplicated: Secondary | ICD-10-CM | POA: Diagnosis present

## 2020-08-08 DIAGNOSIS — Z20822 Contact with and (suspected) exposure to covid-19: Secondary | ICD-10-CM | POA: Diagnosis present

## 2020-08-08 DIAGNOSIS — I10 Essential (primary) hypertension: Secondary | ICD-10-CM | POA: Diagnosis present

## 2020-08-08 DIAGNOSIS — X58XXXA Exposure to other specified factors, initial encounter: Secondary | ICD-10-CM | POA: Diagnosis present

## 2020-08-08 DIAGNOSIS — Z9114 Patient's other noncompliance with medication regimen: Secondary | ICD-10-CM

## 2020-08-08 DIAGNOSIS — I671 Cerebral aneurysm, nonruptured: Secondary | ICD-10-CM | POA: Diagnosis present

## 2020-08-08 DIAGNOSIS — T465X6A Underdosing of other antihypertensive drugs, initial encounter: Secondary | ICD-10-CM | POA: Diagnosis present

## 2020-08-08 DIAGNOSIS — I4891 Unspecified atrial fibrillation: Secondary | ICD-10-CM

## 2020-08-08 LAB — CBC WITH DIFFERENTIAL/PLATELET
Abs Immature Granulocytes: 0.1 10*3/uL — ABNORMAL HIGH (ref 0.00–0.07)
Basophils Absolute: 0.1 10*3/uL (ref 0.0–0.1)
Basophils Relative: 0 %
Eosinophils Absolute: 0.1 10*3/uL (ref 0.0–0.5)
Eosinophils Relative: 1 %
HCT: 36.2 % (ref 36.0–46.0)
Hemoglobin: 12.7 g/dL (ref 12.0–15.0)
Immature Granulocytes: 1 %
Lymphocytes Relative: 6 %
Lymphs Abs: 1.1 10*3/uL (ref 0.7–4.0)
MCH: 33.9 pg (ref 26.0–34.0)
MCHC: 35.1 g/dL (ref 30.0–36.0)
MCV: 96.5 fL (ref 80.0–100.0)
Monocytes Absolute: 1.4 10*3/uL — ABNORMAL HIGH (ref 0.1–1.0)
Monocytes Relative: 8 %
Neutro Abs: 15.5 10*3/uL — ABNORMAL HIGH (ref 1.7–7.7)
Neutrophils Relative %: 84 %
Platelets: 425 10*3/uL — ABNORMAL HIGH (ref 150–400)
RBC: 3.75 MIL/uL — ABNORMAL LOW (ref 3.87–5.11)
RDW: 13.2 % (ref 11.5–15.5)
WBC: 18.3 10*3/uL — ABNORMAL HIGH (ref 4.0–10.5)
nRBC: 0 % (ref 0.0–0.2)

## 2020-08-08 LAB — COMPREHENSIVE METABOLIC PANEL
ALT: 11 U/L (ref 0–44)
AST: 12 U/L — ABNORMAL LOW (ref 15–41)
Albumin: 3.4 g/dL — ABNORMAL LOW (ref 3.5–5.0)
Alkaline Phosphatase: 101 U/L (ref 38–126)
Anion gap: 11 (ref 5–15)
BUN: 10 mg/dL (ref 8–23)
CO2: 29 mmol/L (ref 22–32)
Calcium: 8.7 mg/dL — ABNORMAL LOW (ref 8.9–10.3)
Chloride: 93 mmol/L — ABNORMAL LOW (ref 98–111)
Creatinine, Ser: <0.3 mg/dL — ABNORMAL LOW (ref 0.44–1.00)
Glucose, Bld: 117 mg/dL — ABNORMAL HIGH (ref 70–99)
Potassium: 2.9 mmol/L — ABNORMAL LOW (ref 3.5–5.1)
Sodium: 133 mmol/L — ABNORMAL LOW (ref 135–145)
Total Bilirubin: 0.8 mg/dL (ref 0.3–1.2)
Total Protein: 7.1 g/dL (ref 6.5–8.1)

## 2020-08-08 LAB — HEPARIN LEVEL (UNFRACTIONATED): Heparin Unfractionated: <0.1 IU/mL — ABNORMAL LOW (ref 0.30–0.70)

## 2020-08-08 LAB — TROPONIN I (HIGH SENSITIVITY)
Troponin I (High Sensitivity): 25 ng/L — ABNORMAL HIGH (ref <18)
Troponin I (High Sensitivity): 60 ng/L — ABNORMAL HIGH (ref <18)
Troponin I (High Sensitivity): 103 ng/L (ref <18)
Troponin I (High Sensitivity): 91 ng/L — ABNORMAL HIGH (ref <18)

## 2020-08-08 LAB — PROTIME-INR
INR: 1.1 (ref 0.8–1.2)
Prothrombin Time: 13.8 seconds (ref 11.4–15.2)

## 2020-08-08 LAB — RESPIRATORY PANEL BY RT PCR (FLU A&B, COVID)
Influenza A by PCR: NEGATIVE
Influenza B by PCR: NEGATIVE
SARS Coronavirus 2 by RT PCR: NEGATIVE

## 2020-08-08 LAB — URINALYSIS, COMPLETE (UACMP) WITH MICROSCOPIC
Bacteria, UA: NONE SEEN
Bilirubin Urine: NEGATIVE
Glucose, UA: NEGATIVE mg/dL
Hgb urine dipstick: NEGATIVE
Ketones, ur: NEGATIVE mg/dL
Leukocytes,Ua: NEGATIVE
Nitrite: NEGATIVE
Protein, ur: NEGATIVE mg/dL
Specific Gravity, Urine: 1.006 (ref 1.005–1.030)
pH: 8 (ref 5.0–8.0)

## 2020-08-08 LAB — URINE DRUG SCREEN, QUALITATIVE (ARMC ONLY)
Amphetamines, Ur Screen: NOT DETECTED
Barbiturates, Ur Screen: NOT DETECTED
Benzodiazepine, Ur Scrn: NOT DETECTED
Cannabinoid 50 Ng, Ur ~~LOC~~: NOT DETECTED
Cocaine Metabolite,Ur ~~LOC~~: NOT DETECTED
MDMA (Ecstasy)Ur Screen: NOT DETECTED
Methadone Scn, Ur: NOT DETECTED
Opiate, Ur Screen: NOT DETECTED
Phencyclidine (PCP) Ur S: NOT DETECTED
Tricyclic, Ur Screen: NOT DETECTED

## 2020-08-08 LAB — LACTIC ACID, PLASMA
Lactic Acid, Venous: 2 mmol/L (ref 0.5–1.9)
Lactic Acid, Venous: 1.8 mmol/L (ref 0.5–1.9)
Lactic Acid, Venous: 1 mmol/L (ref 0.5–1.9)

## 2020-08-08 LAB — APTT: aPTT: 34 seconds (ref 24–36)

## 2020-08-08 LAB — MAGNESIUM: Magnesium: 2.1 mg/dL (ref 1.7–2.4)

## 2020-08-08 LAB — TSH: TSH: 2.689 u[IU]/mL (ref 0.350–4.500)

## 2020-08-08 LAB — FIBRIN DERIVATIVES D-DIMER (ARMC ONLY): Fibrin derivatives D-dimer (ARMC): 608.87 ng/mL (FEU) — ABNORMAL HIGH (ref 0.00–499.00)

## 2020-08-08 LAB — BRAIN NATRIURETIC PEPTIDE: B Natriuretic Peptide: 233.2 pg/mL — ABNORMAL HIGH (ref 0.0–100.0)

## 2020-08-08 LAB — PROCALCITONIN: Procalcitonin: <0.1 ng/mL

## 2020-08-08 IMAGING — US US EXTREM LOW VENOUS
1 series · 14 of 24 positions shown · non-contrast
Comparison: None.

CLINICAL DATA: Positive D-dimer.

EXAM:
BILATERAL LOWER EXTREMITY VENOUS DOPPLER ULTRASOUND
TECHNIQUE: Gray-scale sonography with compression, as well as color and duplex
ultrasound, were performed to evaluate the deep venous system(s)
from the level of the common femoral vein through the popliteal and
proximal calf veins.

[Series 1: us venous img lower bilat (dvt) · portal-venous · 14 of 58 slices shown]
[im 1/58]
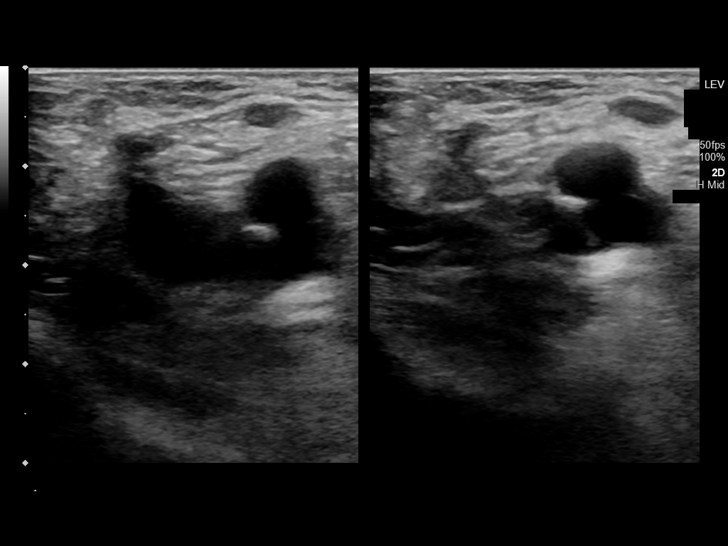
[im 5/58]
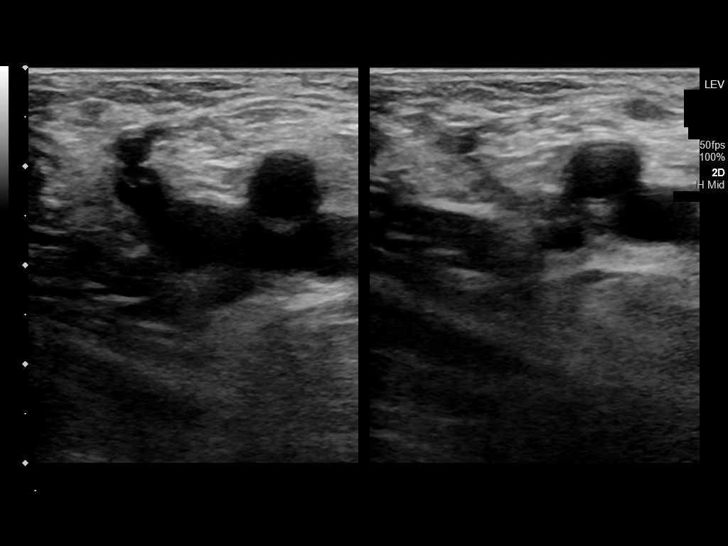
[im 10/58]
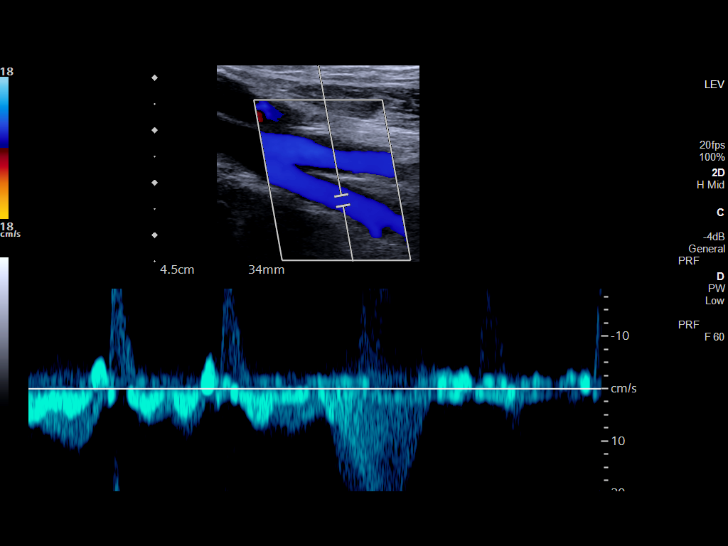
[im 15/58]
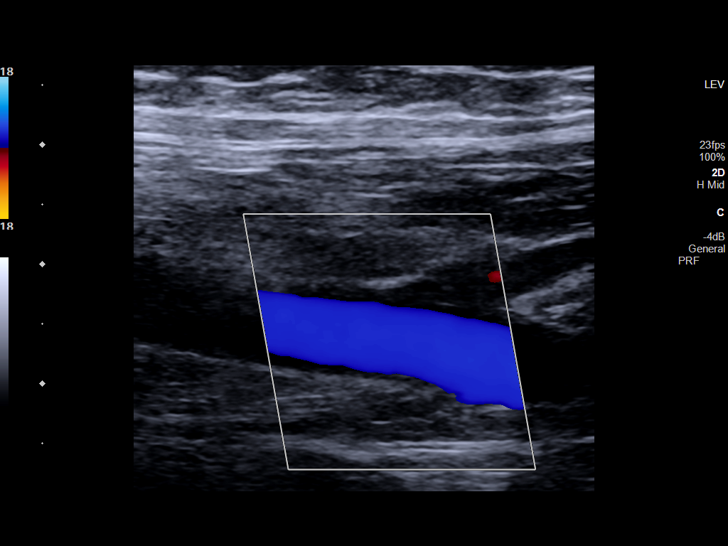
[im 18/58]
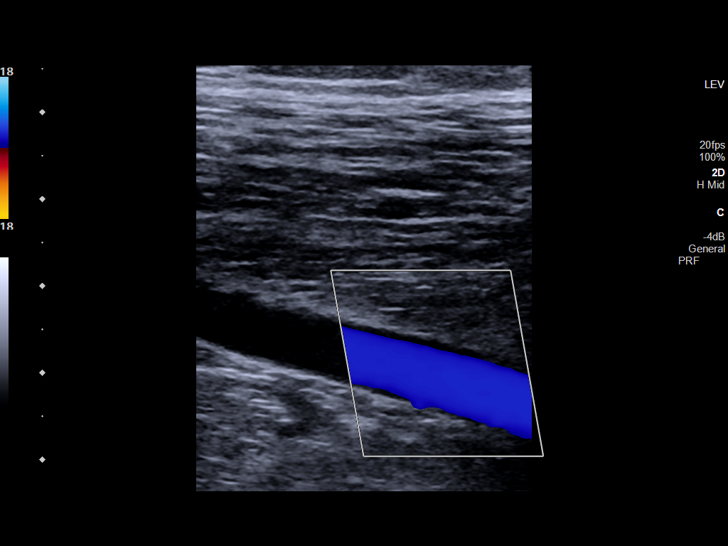
[im 23/58]
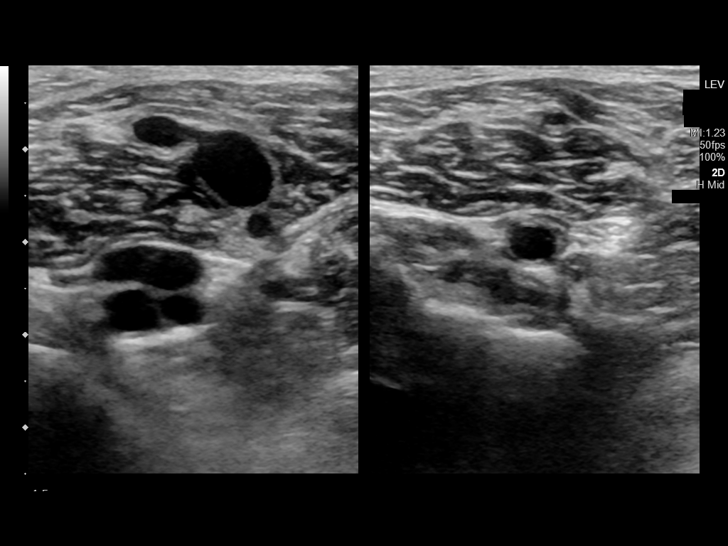
[im 28/58]
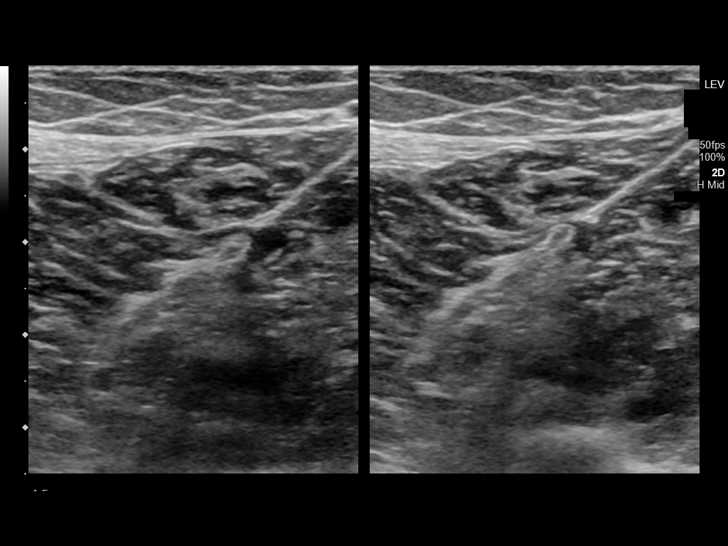
[im 30/58]
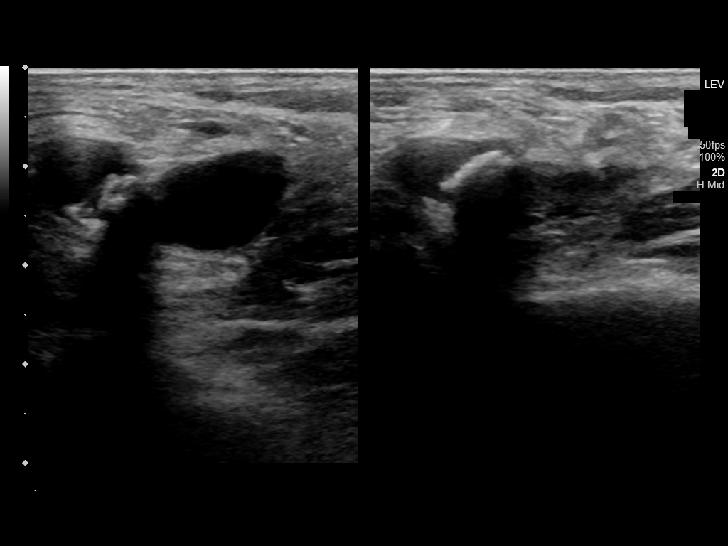
[im 35/58]
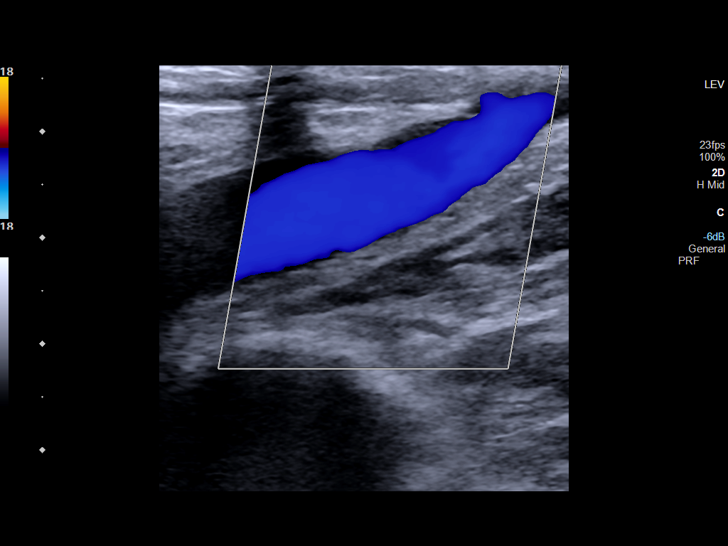
[im 40/58]
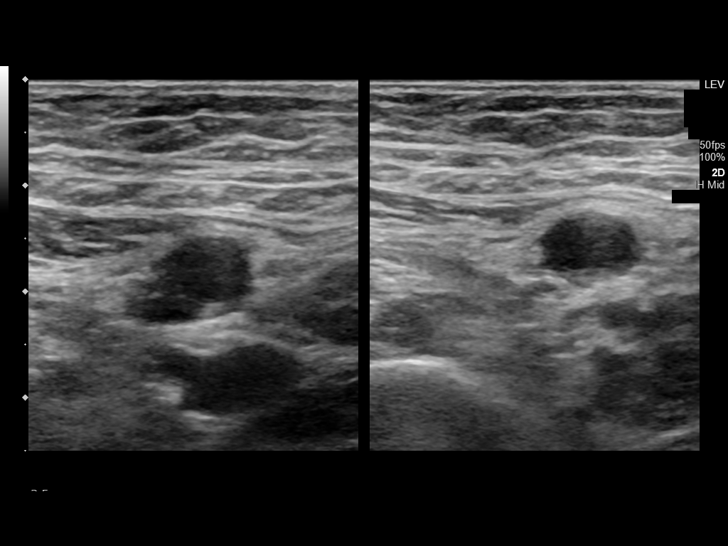
[im 45/58]
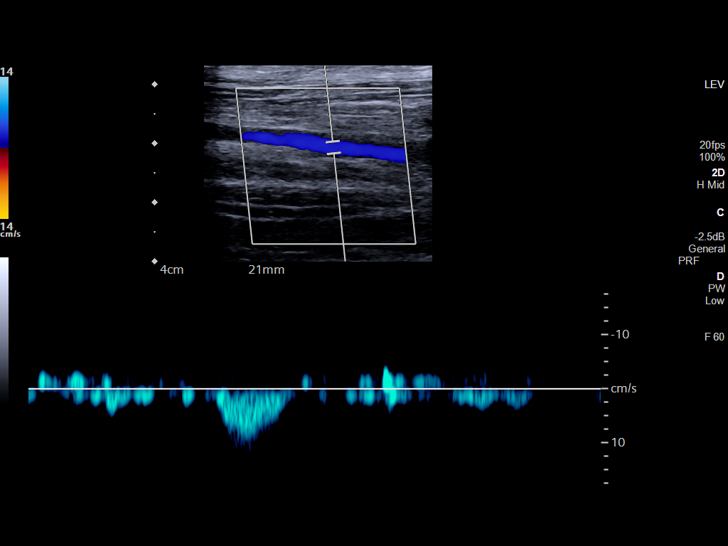
[im 48/58]
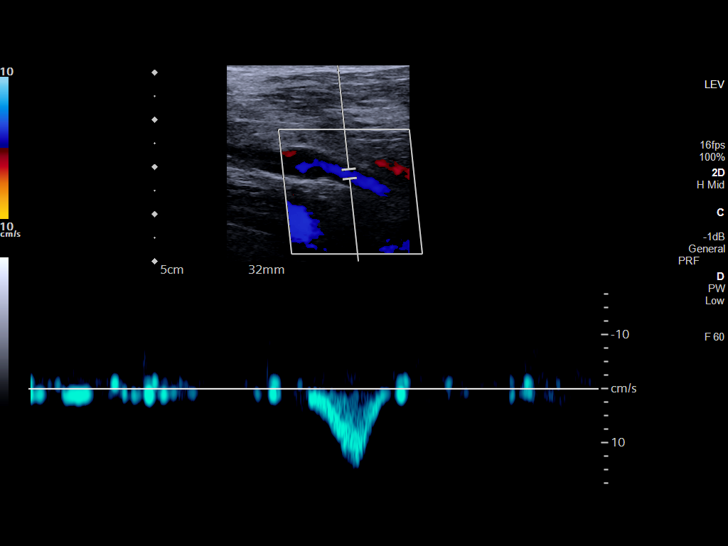
[im 53/58]
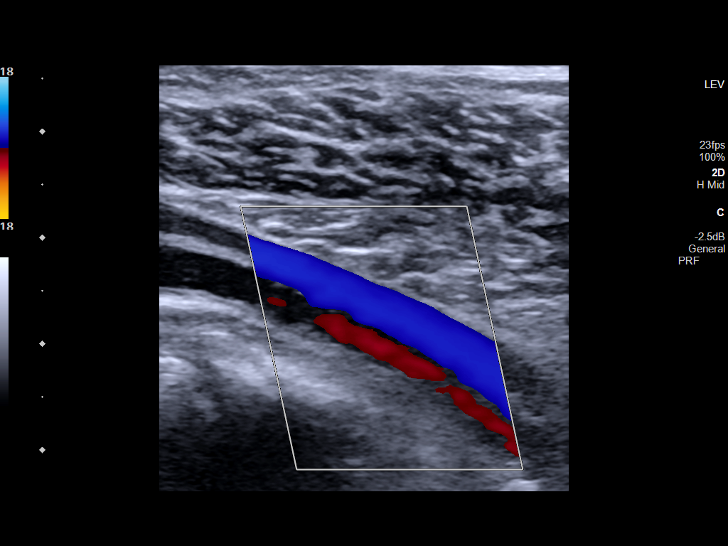
[im 58/58]
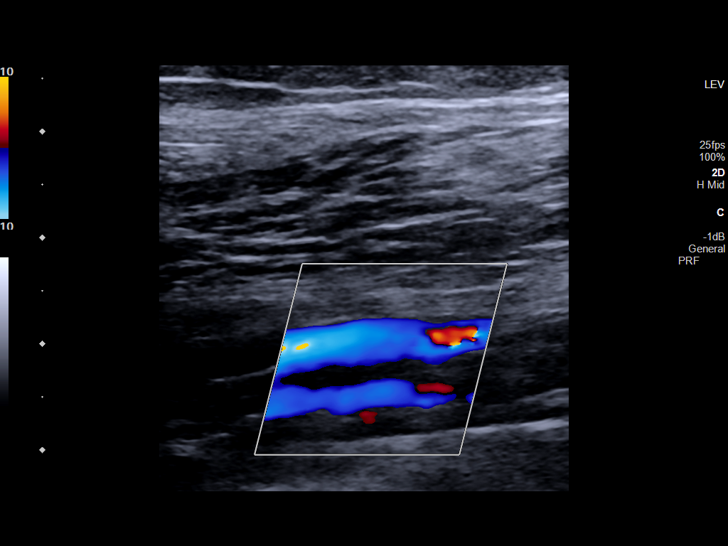

[14 of 24 positions shown; findings below may reference images not displayed]

FINDINGS: VENOUS

Normal compressibility of the common femoral, superficial femoral,
and popliteal veins, as well as the visualized calf veins.
Visualized portions of profunda femoral vein and great saphenous
vein unremarkable. No filling defects to suggest DVT on grayscale or
color Doppler imaging. Doppler waveforms show normal direction of
venous flow, normal respiratory plasticity and response to
augmentation.

OTHER

None.

Limitations: none
IMPRESSION: Negative.

## 2020-08-08 IMAGING — CT CT ANGIO CHEST
2 of 6 series · 18 of 46 positions shown · IV contrast (APPLIED)
Comparison: Chest x-ray from earlier in the same day.

CLINICAL DATA: History of head and neck cancer with hypotension and
shortness of breath

EXAM:
CT ANGIOGRAPHY CHEST WITH CONTRAST
TECHNIQUE: Multidetector CT imaging of the chest was performed using the
standard protocol during bolus administration of intravenous
contrast. Multiplanar CT image reconstructions and MIPs were
obtained to evaluate the vascular anatomy.
CONTRAST:  75mL OMNIPAQUE IOHEXOL 350 MG/ML SOLN

[Series 5: thins · axial · 0.66mm/px · z∈[-301,-41]mm · 15 of 286 slices shown]
[im 13/286  lung]
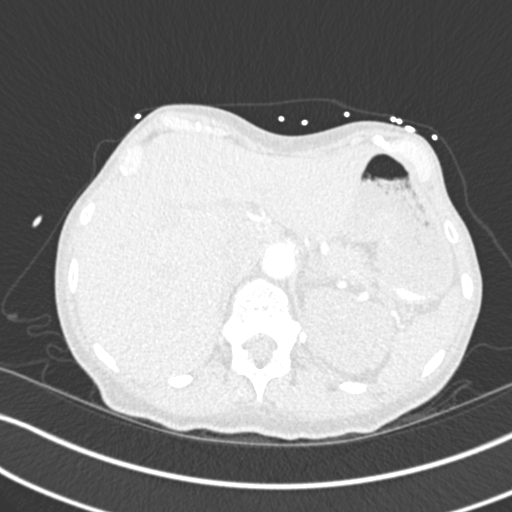
[im 38/286  soft-tissue]
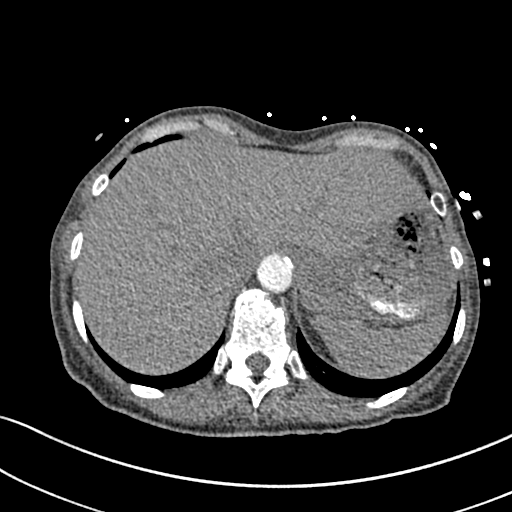
[im 50/286  lung]
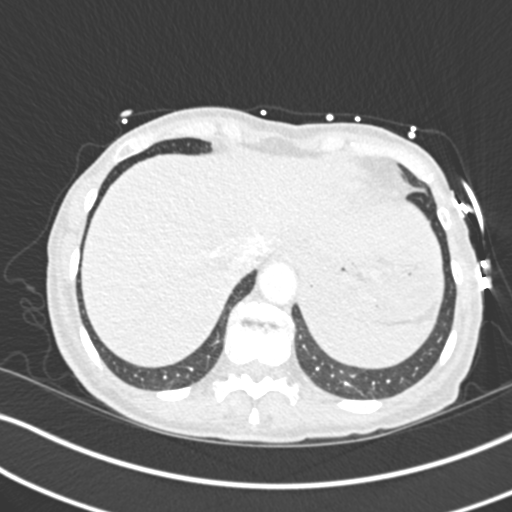
[im 75/286  soft-tissue]
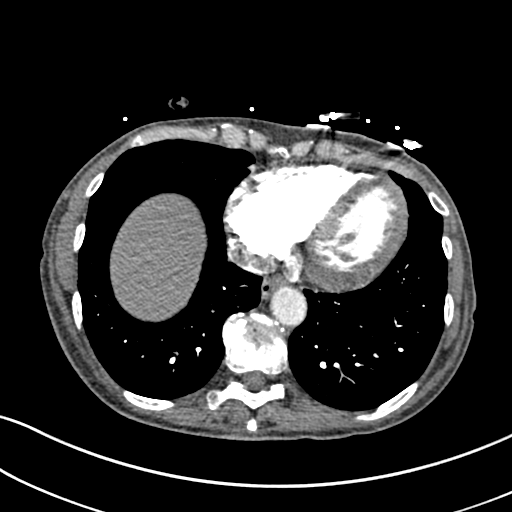
[im 87/286  lung]
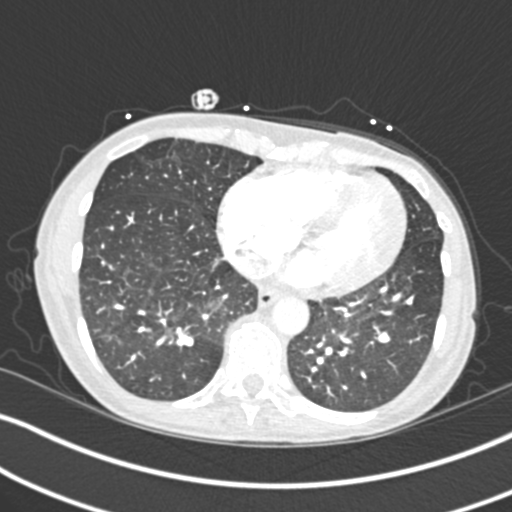
[im 112/286  soft-tissue]
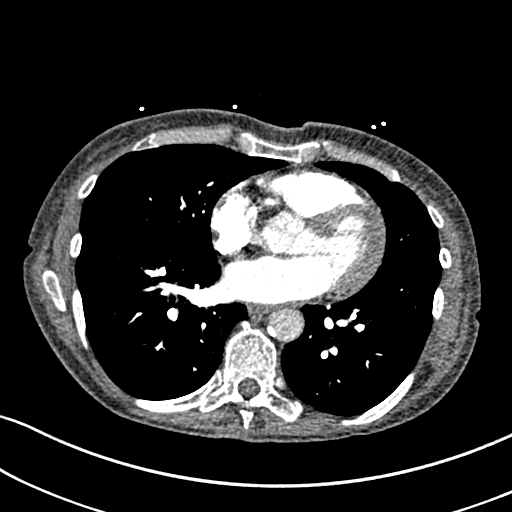
[im 124/286  lung]
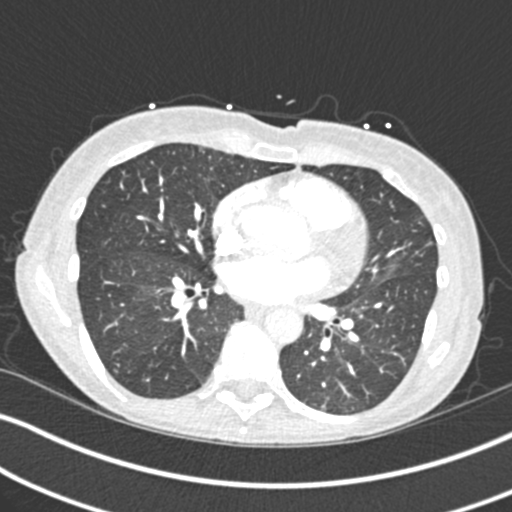
[im 149/286  soft-tissue]
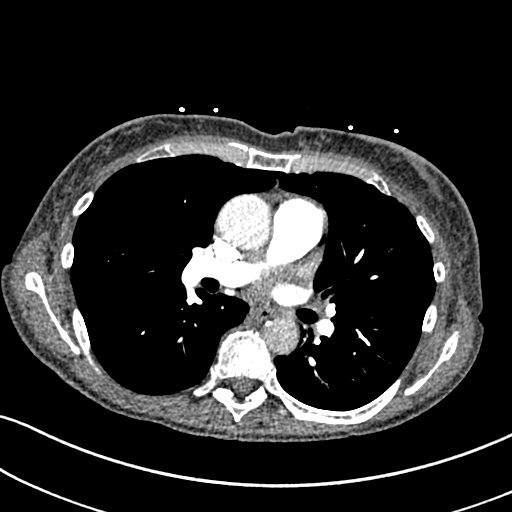
[im 162/286  lung]
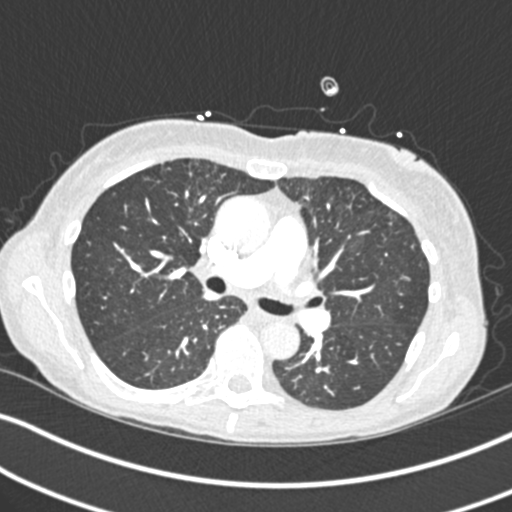
[im 174/286  soft-tissue]
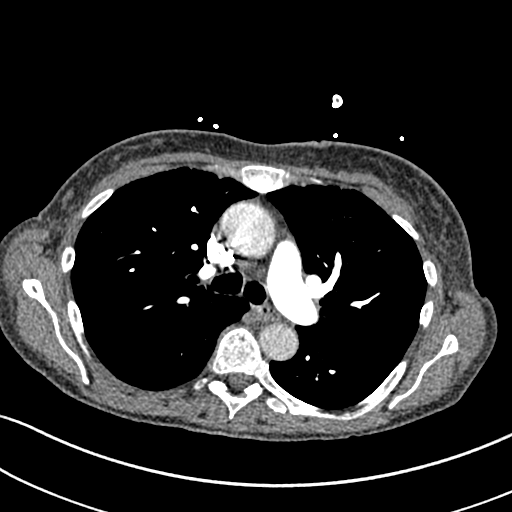
[im 199/286  lung]
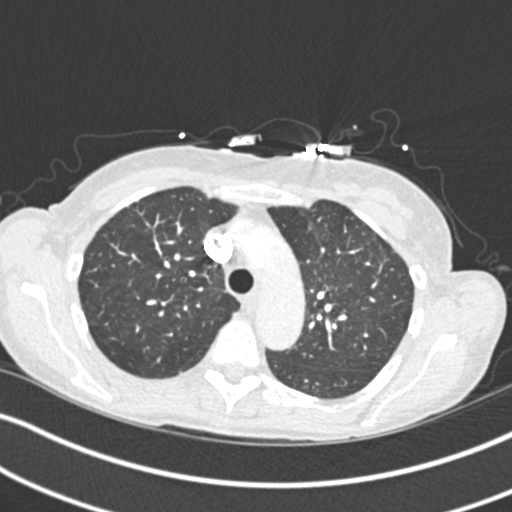
[im 211/286  soft-tissue]
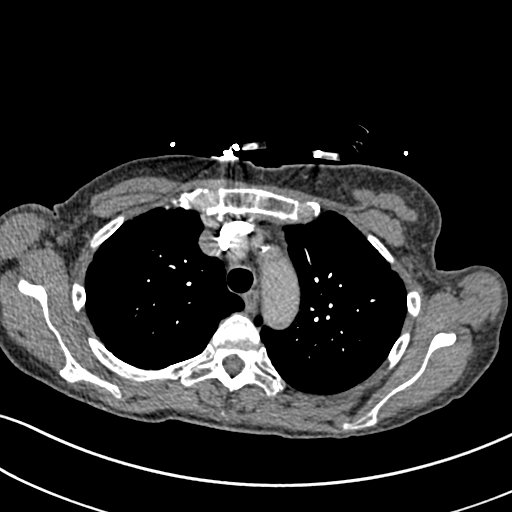
[im 236/286  lung]
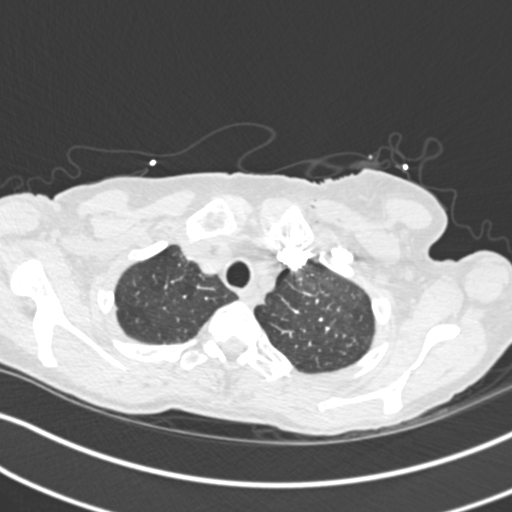
[im 248/286  soft-tissue]
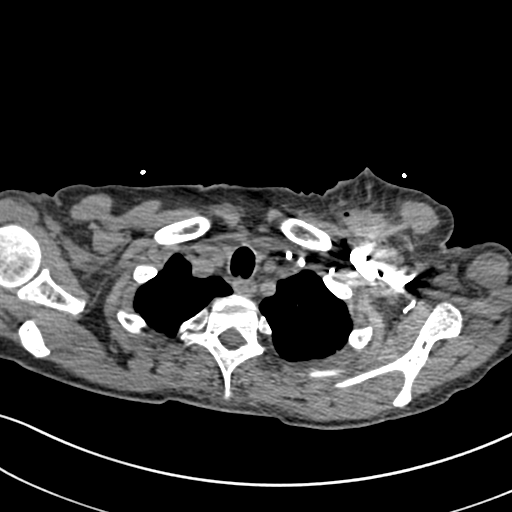
[im 273/286  lung]
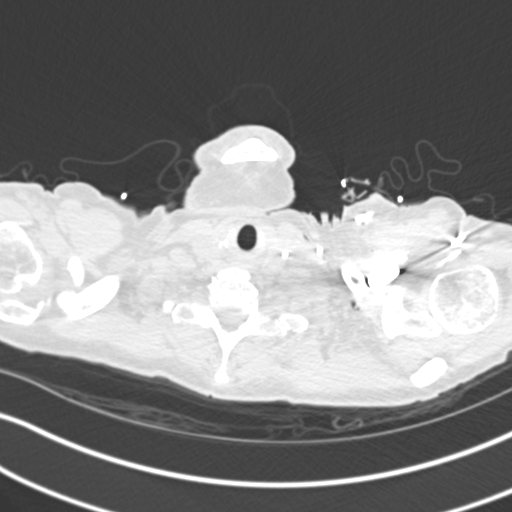

[Series 7: coronal mpr · coronal · 0.63mm/px · 3 of 75 slices shown]
[im 19/75  soft-tissue]
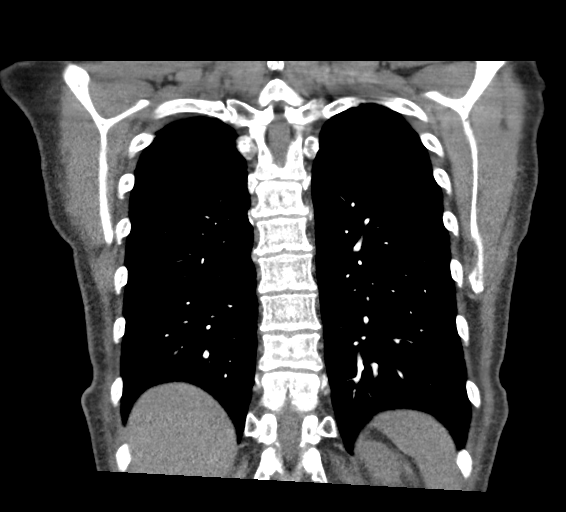
[im 38/75  soft-tissue]
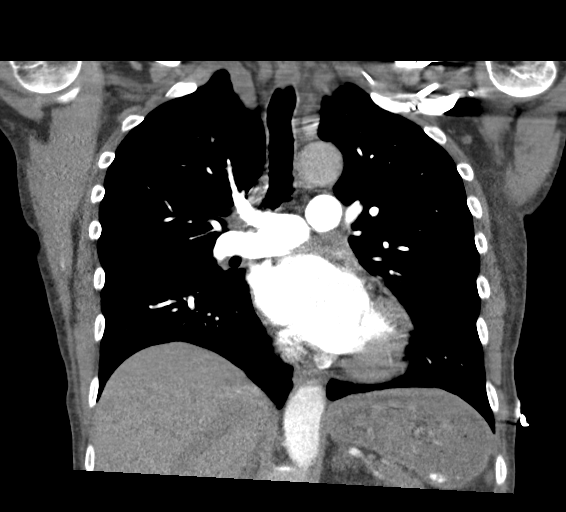
[im 56/75  soft-tissue]
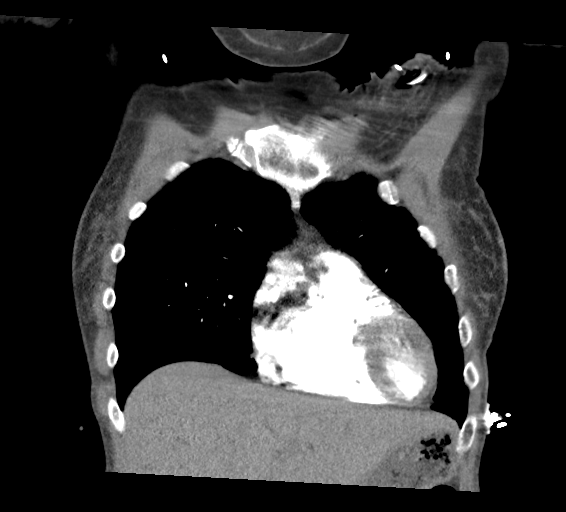

[18 of 46 positions shown; findings below may reference images not displayed]

FINDINGS: Cardiovascular: Thoracic aorta demonstrates atherosclerotic
calcifications without aneurysmal dilatation or dissection. No
cardiac enlargement is seen. No significant coronary calcifications
are noted. The pulmonary artery shows a normal branching pattern. No
focal filling defect to suggest pulmonary embolism is seen.

Mediastinum/Nodes: Thoracic inlet is within normal limits. Left
chest wall port is seen. Needle is noted within the port. No
significant hilar or mediastinal adenopathy is noted. The esophagus
as visualized is within normal limits.

Lungs/Pleura: The lungs are well aerated. No focal infiltrate or
sizable effusion is seen. No pneumothorax or sizable parenchymal
nodules are noted.

Upper Abdomen: Visualized upper abdomen shows no acute abnormality.

Musculoskeletal: Degenerative changes of the thoracic spine are
noted. No acute rib abnormality is seen.

Review of the MIP images confirms the above findings.
IMPRESSION: No evidence of pulmonary emboli.

No other focal abnormality is seen.

Aortic Atherosclerosis ([GN]-[GN]).

## 2020-08-08 IMAGING — CR DG CHEST 2V
2 series · 2 of 2 positions shown · non-contrast
Comparison: [DATE]

CLINICAL DATA: Tachycardia

EXAM:
CHEST - 2 VIEW

[chest lat]
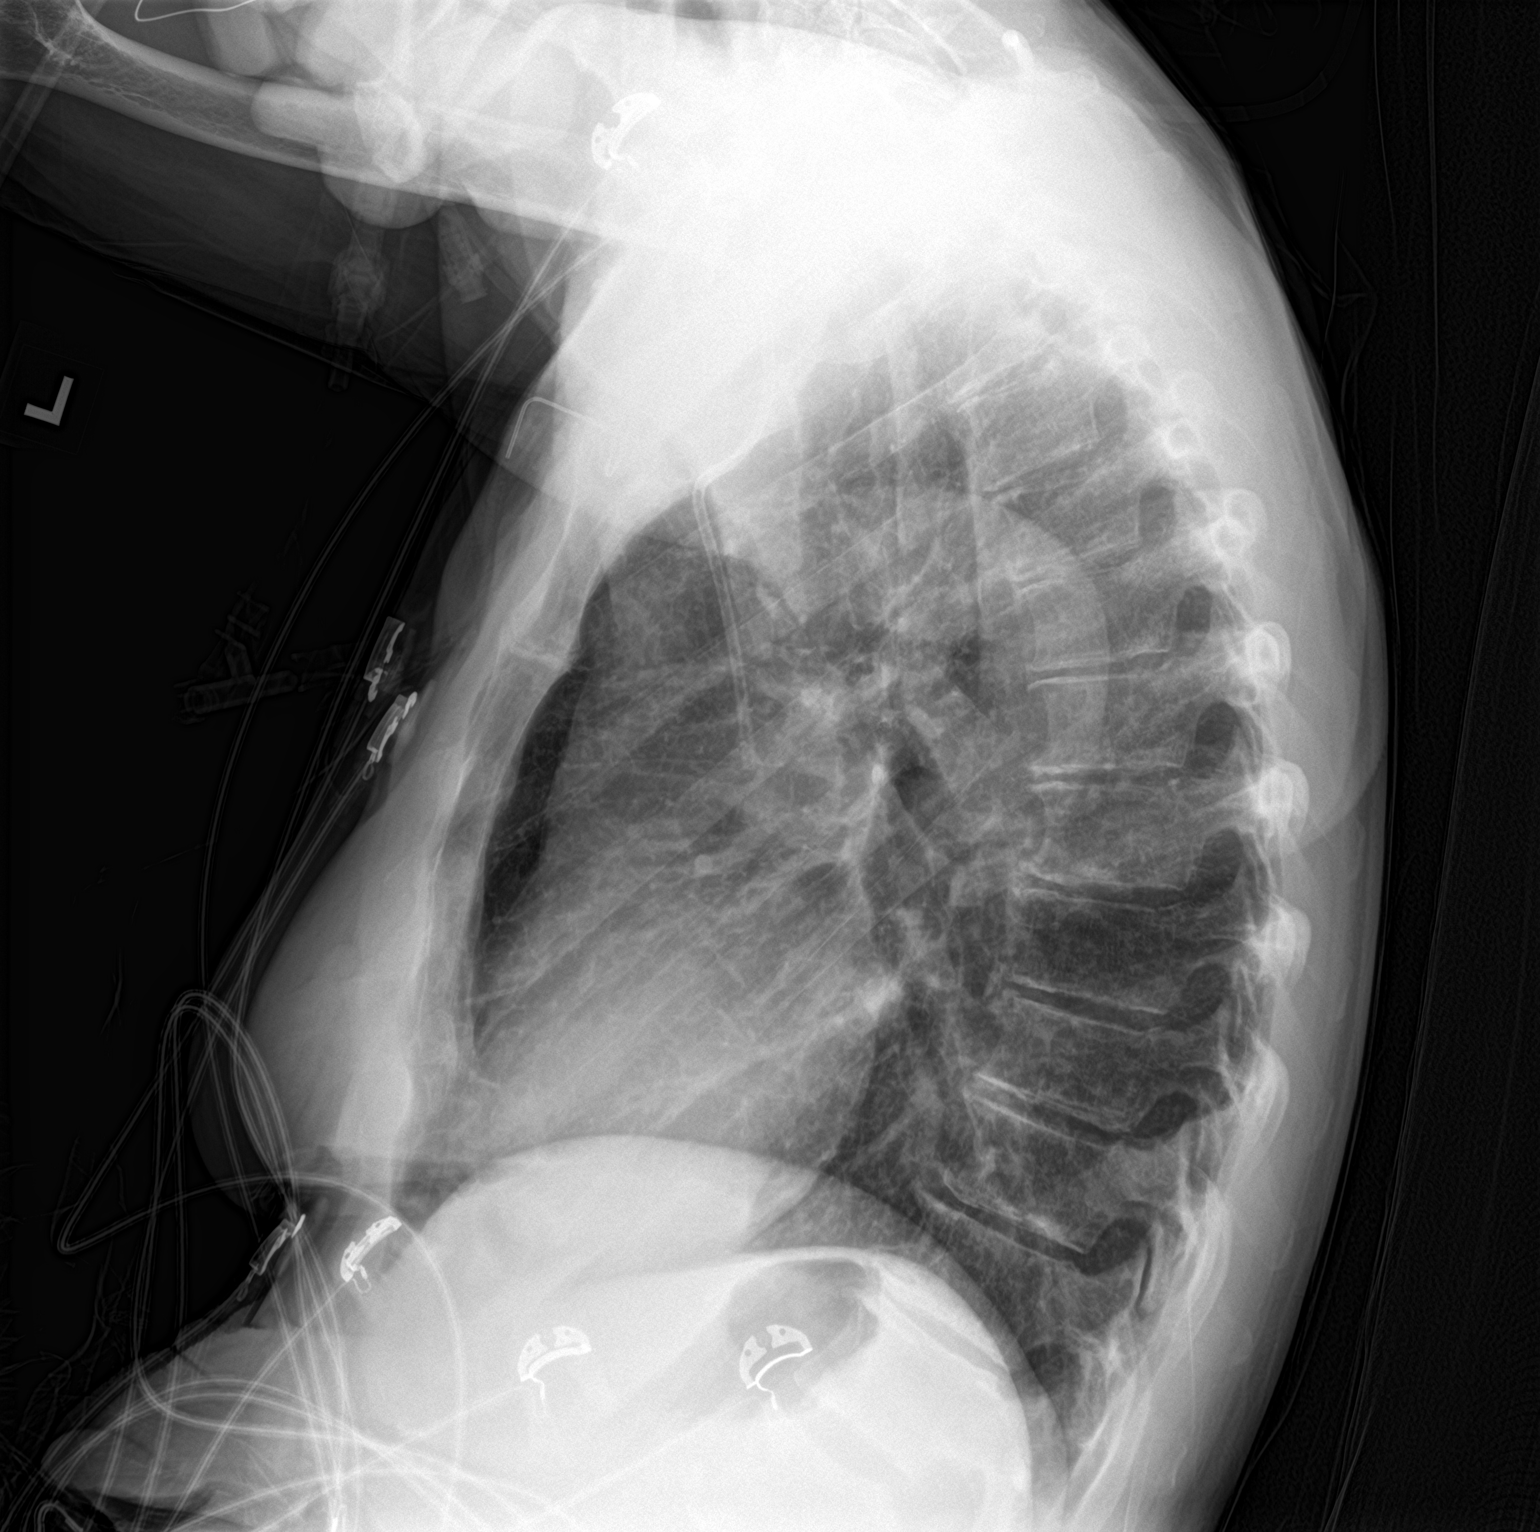

[chest ap]
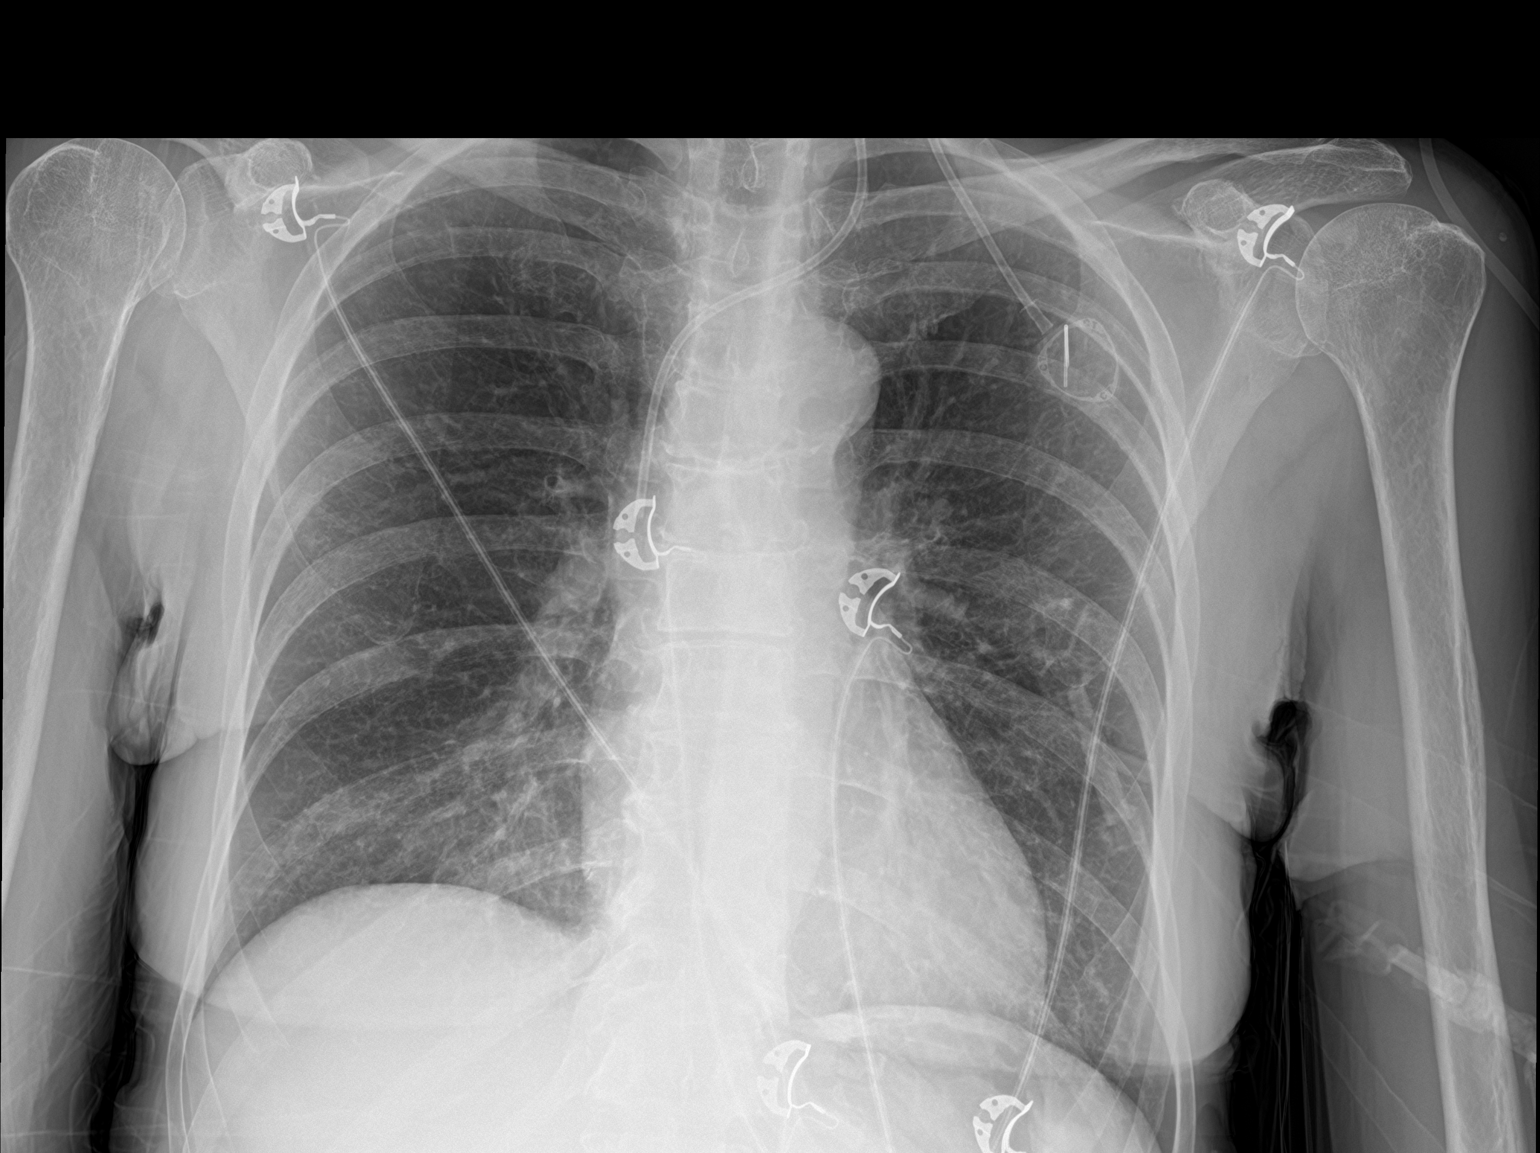

[2 of 2 positions shown; findings below may reference images not displayed]

FINDINGS: The cardiomediastinal silhouette is unchanged in contour.LEFT chest
port with tip terminating over the SVC. No pleural effusion. No
pneumothorax. No acute pleuroparenchymal abnormality. Visualized
abdomen is unremarkable. Multilevel degenerative changes of the
thoracic spine.
IMPRESSION: No acute cardiopulmonary abnormality.

## 2020-08-08 MED ORDER — POTASSIUM CHLORIDE CRYS ER 20 MEQ PO TBCR
40.0000 meq | EXTENDED_RELEASE_TABLET | ORAL | Status: AC
  Administered 2020-08-08 (×2): 40 meq via ORAL
  Filled 2020-08-08 (×2): qty 2

## 2020-08-08 MED ORDER — SODIUM CHLORIDE 0.9 % IV SOLN
Freq: Once | INTRAVENOUS | Status: AC
  Filled 2020-08-08: qty 250

## 2020-08-08 MED ORDER — ACETAMINOPHEN 325 MG PO TABS: 650.0000 mg | ORAL_TABLET | Freq: Four times a day (QID) | ORAL | Status: DC | PRN

## 2020-08-08 MED ORDER — DILTIAZEM LOAD VIA INFUSION
10.0000 mg | Freq: Once | INTRAVENOUS | Status: AC
  Administered 2020-08-08: 10 mg via INTRAVENOUS
  Filled 2020-08-08: qty 10

## 2020-08-08 MED ORDER — NICOTINE 21 MG/24HR TD PT24
21.0000 mg | MEDICATED_PATCH | Freq: Every day | TRANSDERMAL | Status: DC
  Administered 2020-08-08 – 2020-08-10 (×3): 21 mg via TRANSDERMAL
  Filled 2020-08-08 (×3): qty 1

## 2020-08-08 MED ORDER — HEPARIN BOLUS VIA INFUSION
1500.0000 [IU] | Freq: Once | INTRAVENOUS | Status: AC
  Administered 2020-08-08: 1500 [IU] via INTRAVENOUS
  Filled 2020-08-08: qty 1500

## 2020-08-08 MED ORDER — POTASSIUM CHLORIDE 10 MEQ/100ML IV SOLN
10.0000 meq | INTRAVENOUS | Status: AC
  Administered 2020-08-08 (×4): 10 meq via INTRAVENOUS
  Filled 2020-08-08 (×4): qty 100

## 2020-08-08 MED ORDER — HEPARIN BOLUS VIA INFUSION
3800.0000 [IU] | Freq: Once | INTRAVENOUS | Status: AC
  Administered 2020-08-08: 3800 [IU] via INTRAVENOUS
  Filled 2020-08-08: qty 3800

## 2020-08-08 MED ORDER — HEPARIN (PORCINE) 25000 UT/250ML-% IV SOLN
1250.0000 [IU]/h | INTRAVENOUS | Status: DC
  Administered 2020-08-08: 900 [IU]/h via INTRAVENOUS
  Administered 2020-08-09: 1250 [IU]/h via INTRAVENOUS
  Filled 2020-08-08 (×2): qty 250

## 2020-08-08 MED ORDER — FENTANYL 25 MCG/HR TD PT72
1.0000 | MEDICATED_PATCH | TRANSDERMAL | Status: DC
  Administered 2020-08-09: 1 via TRANSDERMAL
  Filled 2020-08-08 (×2): qty 1

## 2020-08-08 MED ORDER — B COMPLEX-C PO TABS
1.0000 | ORAL_TABLET | Freq: Every day | ORAL | Status: DC
  Administered 2020-08-08 – 2020-08-10 (×3): 1 via ORAL
  Filled 2020-08-08 (×4): qty 1

## 2020-08-08 MED ORDER — DM-GUAIFENESIN ER 30-600 MG PO TB12: 1.0000 | ORAL_TABLET | Freq: Two times a day (BID) | ORAL | Status: DC | PRN

## 2020-08-08 MED ORDER — LIDOCAINE-PRILOCAINE 2.5-2.5 % EX CREA
1.0000 "application " | TOPICAL_CREAM | CUTANEOUS | Status: DC | PRN
  Filled 2020-08-08: qty 5

## 2020-08-08 MED ORDER — ALBUTEROL SULFATE HFA 108 (90 BASE) MCG/ACT IN AERS
2.0000 | INHALATION_SPRAY | RESPIRATORY_TRACT | Status: DC | PRN
  Filled 2020-08-08: qty 6.7

## 2020-08-08 MED ORDER — ADULT MULTIVITAMIN W/MINERALS CH
1.0000 | ORAL_TABLET | Freq: Every day | ORAL | Status: DC
  Administered 2020-08-08 – 2020-08-10 (×3): 1 via ORAL
  Filled 2020-08-08 (×3): qty 1

## 2020-08-08 MED ORDER — POTASSIUM CHLORIDE CRYS ER 20 MEQ PO TBCR
40.0000 meq | EXTENDED_RELEASE_TABLET | Freq: Once | ORAL | Status: DC
  Filled 2020-08-08: qty 2

## 2020-08-08 MED ORDER — MELATONIN 5 MG PO TABS
10.0000 mg | ORAL_TABLET | Freq: Every evening | ORAL | Status: DC | PRN
  Filled 2020-08-08: qty 2

## 2020-08-08 MED ORDER — ONDANSETRON HCL 4 MG PO TABS: 4.0000 mg | ORAL_TABLET | Freq: Four times a day (QID) | ORAL | Status: DC | PRN

## 2020-08-08 MED ORDER — DILTIAZEM HCL-DEXTROSE 125-5 MG/125ML-% IV SOLN (PREMIX)
5.0000 mg/h | INTRAVENOUS | Status: DC
  Administered 2020-08-08: 15 mg/h via INTRAVENOUS
  Administered 2020-08-08: 5 mg/h via INTRAVENOUS
  Filled 2020-08-08: qty 125

## 2020-08-08 MED ORDER — DIGOXIN 0.25 MG/ML IJ SOLN
0.2500 mg | Freq: Once | INTRAMUSCULAR | Status: AC
  Administered 2020-08-08: 0.25 mg via INTRAVENOUS
  Filled 2020-08-08 (×2): qty 2

## 2020-08-08 MED ORDER — METOPROLOL TARTRATE 5 MG/5ML IV SOLN: 5.0000 mg | Freq: Four times a day (QID) | INTRAVENOUS | Status: DC | PRN

## 2020-08-08 MED ORDER — IOHEXOL 350 MG/ML SOLN
75.0000 mL | Freq: Once | INTRAVENOUS | Status: AC | PRN
  Administered 2020-08-08: 75 mL via INTRAVENOUS

## 2020-08-08 MED ORDER — GABAPENTIN 300 MG PO CAPS
300.0000 mg | ORAL_CAPSULE | Freq: Three times a day (TID) | ORAL | Status: DC
  Administered 2020-08-08 – 2020-08-10 (×6): 300 mg via ORAL
  Filled 2020-08-08 (×6): qty 1

## 2020-08-08 MED ORDER — OXYCODONE-ACETAMINOPHEN 5-325 MG PO TABS
1.0000 | ORAL_TABLET | ORAL | Status: DC | PRN
  Administered 2020-08-08 – 2020-08-09 (×4): 1 via ORAL
  Filled 2020-08-08 (×4): qty 1

## 2020-08-08 MED ORDER — LACTATED RINGERS IV BOLUS
500.0000 mL | Freq: Once | INTRAVENOUS | Status: AC
  Administered 2020-08-08: 500 mL via INTRAVENOUS

## 2020-08-08 MED ORDER — GINKGO BILOBA 40 MG PO TABS: 1.0000 | ORAL_TABLET | Freq: Every day | ORAL | Status: DC

## 2020-08-08 MED ORDER — METOPROLOL TARTRATE 25 MG PO TABS
25.0000 mg | ORAL_TABLET | Freq: Four times a day (QID) | ORAL | Status: DC
  Administered 2020-08-08 – 2020-08-09 (×3): 25 mg via ORAL
  Filled 2020-08-08 (×4): qty 1

## 2020-08-08 NOTE — Consult Note (Signed)
Cardiology Consultation:   Patient ID: LOVETTA Schroeder MRN: [id number]; DOB: 1956/09/01  Admit date: 08/08/2020 Date of Consult: 08/08/2020  Primary Care Provider: Merryl Hacker No [facility] Cardiologist: [place] Physician requesting consult: Dr. Blaine Hamper Reason for consult: Atrial fibrillation with RVR   Patient Profile:   Shannon Schroeder is a 64 y.o. female with a hx of COPD, bipolar disorder, invasive squamous cell carcinoma/maxillary sinus cancer, recently started on radiation treatment, being set up for chemotherapy, who presented to oncology today to start her chemotherapy noted to have hypertension, general malaise, tachyarrhythmia, EKG concerning for atrial fibrillation  History of Present Illness:   Ms. Colin reports having general fatigue and weakness for at least 5 days, possibly longer.  Since last Saturday, October 2 she has had profound fatigue.  Family at the bedside confirms she has not been herself.  She denies any tachypalpitations or dramatic shortness of breath just overall overwhelming fatigue.  She attributed this to recent treatments, has been seen by Dr. Donella Stade undergoing XRT Also did not know if it was pain medication that was started recently, she is on Percocet, fentanyl patch  Seen in oncology today for start of her treatment Noted to be hypotensive systolic pressure in the 81R, tachycardic with nausea vomiting .  Lab work showing potassium 2.9 She was given IV fluids, referred to the emergency room  In the emergency room EKG confirming atrial fibrillation with rapid rate up to 180 bpm Given IV fluids, started on diltiazem infusion Able to tolerate infusion, systolic pressure up into the 130-140 range  At the time of my evaluation she had general malaise, no specific complaints, just general fatigue Denied having any abdominal distention, fluid retention, leg swelling, shortness of breath, no PND orthopnea, only fatigue/weakness  Past Medical  History:  Diagnosis Date  . 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   Marland Kitchen Anxiety   . Arthritis   . Bipolar disorder ([place])   . COPD (chronic obstructive pulmonary disease) ([place])    NO INHALERS  . Depression   . Dysrhythmia    H/O V TACH  . Head and neck cancer ([place]) 07/29/2020  . Headache    H/O MIGRAINES  . Hypertension   . Seizures ([place])    X1 AFTER FALL    Past Surgical History:  Procedure Laterality Date  . ABDOMINAL HYSTERECTOMY     partial  . BREAST SURGERY     biopsy  . CARDIAC CATHETERIZATION     X 2  . CARPAL TUNNEL RELEASE Right   . CARPECTOMY HAND Right   . CATARACT EXTRACTION W/PHACO Left 03/08/2018   Procedure: CATARACT EXTRACTION PHACO AND INTRAOCULAR LENS PLACEMENT (IOC);  Surgeon: Birder Robson, MD;  Location: [facility] ORS;  Service: Ophthalmology;  Laterality: Left;  [place] 00:55 AP% 13.5 CDE 7.52 Fluid pack lot # [id number]  . CATARACT EXTRACTION W/PHACO Right 04/05/2018   Procedure: CATARACT EXTRACTION PHACO AND INTRAOCULAR LENS PLACEMENT (IOC);  Surgeon: Birder Robson, MD;  Location: [facility] ORS;  Service: Ophthalmology;  Laterality: Right;  [place] 00:36 AP% 15.9 CDE 5.72 Fluid pack lot # [id number]  . CEREBRAL ANEURYSM REPAIR    . CHOLECYSTECTOMY    . COLONOSCOPY WITH PROPOFOL N/A 04/26/2019   Procedure: COLONOSCOPY WITH PROPOFOL;  Surgeon: Jonathon Bellows, MD;  Location: [facility] ENDOSCOPY;  Service: Gastroenterology;  Laterality: N/A;  . PORTACATH PLACEMENT Left 08/05/2020   Procedure: INSERTION PORT-A-CATH;  Surgeon: Herbert Pun, MD;  Location: [facility] ORS;  Service:  General;  Laterality: Left;  . TUBAL LIGATION       Home Medications:  Prior to Admission medications   Medication Sig Start Date End Date Taking? Authorizing Provider  b complex vitamins capsule Take 1 capsule by mouth daily. Gummie   Yes [provider]  fentaNYL (DURAGESIC) 25 MCG/HR Place 1 patch onto the skin every 3 (three) days. 07/29/20   Yes Earlie Server, MD  gabapentin (NEURONTIN) 300 MG capsule Take 1 capsule (300 mg total) by mouth 3 (three) times daily. 07/19/20  Yes Borders, Kirt Boys, NP  Ginkgo Biloba (GINKOBA PO) Take 1 tablet by mouth daily.    Yes [provider]  HYDROcodone-acetaminophen (NORCO) 5-325 MG tablet Take 1 tablet by mouth every 4 (four) hours as needed for up to 3 days for moderate pain. 08/05/20 08/08/20 Yes Herbert Pun, MD  lisinopril (ZESTRIL) 20 MG tablet Take 1 tablet (20 mg total) by mouth daily. Patient taking differently: Take 30 mg by mouth daily.  07/29/20  Yes Earlie Server, MD  Multiple Vitamins-Minerals (CENTRUM SILVER PO) Take 1 tablet by mouth daily. Gummie   Yes [provider]  acetaminophen (TYLENOL 8 HOUR ARTHRITIS PAIN) 650 MG CR tablet Take 1,300 mg by mouth daily as needed (Pain).     [provider]  ibuprofen (ADVIL) 400 MG tablet Take 400 mg by mouth every 6 (six) hours as needed for mild pain or moderate pain. Liquid Gel    [provider]  lidocaine-prilocaine (EMLA) cream Apply to affected area once Patient taking differently: Apply 1 application topically as needed [facility]). Apply to affected area once 07/29/20   Earlie Server, MD  Melatonin 10 MG TABS Take 10 mg by mouth at bedtime as needed (sleep).     [provider]  oxyCODONE-acetaminophen (PERCOCET) 5-325 MG tablet Take 1 tablet by mouth every 4 (four) hours as needed for severe pain. 07/12/20 07/12/21  Earlie Server, MD  prochlorperazine (COMPAZINE) 10 MG tablet Take 1 tablet (10 mg total) by mouth every 6 (six) hours as needed (Nausea or vomiting). 07/29/20   Earlie Server, MD    Inpatient Medications: Scheduled Meds: . b complex vitamins  1 capsule Oral Daily  . Centrum Silver  1 tablet Oral Daily  . [START ON 08/09/2020] fentaNYL  1 patch Transdermal Q72H  . gabapentin  300 mg Oral TID  . Ginkgo Biloba  1 tablet Oral Daily  . metoprolol tartrate  25 mg Oral Q6H  . nicotine  21 mg Transdermal  Daily  . potassium chloride  40 mEq Oral Q4H   Continuous Infusions: . diltiazem (CARDIZEM) infusion 15 mg/hr (08/08/20 1347)  . heparin 900 Units/hr (08/08/20 1400)  . potassium chloride 10 mEq (08/08/20 1458)   PRN Meds: acetaminophen, albuterol, dextromethorphan-guaiFENesin, lidocaine-prilocaine, Melatonin, metoprolol tartrate, ondansetron, oxyCODONE-acetaminophen  Allergies:    Allergies  Allergen Reactions  . Aspirin Swelling and Other (See Comments)  . Belladonna Alkaloids Rash  . Fluoxetine Hcl Other (See Comments) and Rash  . Naproxen Swelling, Rash and Other (See Comments)  . Phenobarbital Rash and Other (See Comments)  . Prozac [Fluoxetine Hcl] Rash    Social History:   Social History   Socioeconomic History  . Marital status: Divorced    Spouse name: Not on file  . Number of children: Not on file  . Years of education: 56  . Highest education level: Bachelor's degree (e.g., BA, AB, BS)  Occupational History  . Occupation: farmer  Tobacco Use  . Smoking status:  Current Every Day Smoker    Packs/day: 1.00    Years: 50.00    Pack years: 50.00    Types: Cigarettes  . Smokeless tobacco: Never Used  Vaping Use  . Vaping Use: Never used  Substance and Sexual Activity  . Alcohol use: No  . Drug use: No  . Sexual activity: Not Currently  Other Topics Concern  . Not on file  Social History Narrative   Pt lives at [facility].    Social Determinants of Health   Financial Resource Strain:   . Difficulty of Paying Living Expenses: Not on file  Food Insecurity:   . Worried About Charity fundraiser in the Last Year: Not on file  . Ran Out of Food in the Last Year: Not on file  Transportation Needs:   . Lack of Transportation (Medical): Not on file  . Lack of Transportation (Non-Medical): Not on file  Physical Activity:   . Days of Exercise per Week: Not on file  . Minutes of Exercise per Session: Not on file  Stress:   . Feeling of Stress : Not on  file  Social Connections:   . Frequency of Communication with Friends and Family: Not on file  . Frequency of Social Gatherings with Friends and Family: Not on file  . Attends Religious Services: Not on file  . Active Member of Clubs or Organizations: Not on file  . Attends Archivist Meetings: Not on file  . Marital Status: Not on file  Intimate Partner Violence:   . Fear of Current or Ex-Partner: Not on file  . Emotionally Abused: Not on file  . Physically Abused: Not on file  . Sexually Abused: Not on file    Family History:    Family History  Problem Relation Age of Onset  . Hypertension Mother   . Heart failure Mother   . Hypertension Brother   . Stroke Brother   . Hypertension Son   . Emphysema Maternal Aunt   . Hypertension Paternal Aunt   . Hypertension Paternal Uncle   . Hypertension Maternal Grandmother   . Hypertension Paternal Grandmother      ROS:  Please see the history of present illness.  Review of Systems  Constitutional: Positive for malaise/fatigue.  HENT: Negative.   Respiratory: Negative.   Cardiovascular: Negative.   Gastrointestinal: Negative.   Musculoskeletal: Negative.   Neurological: Negative.   Psychiatric/Behavioral: Negative.   All other systems reviewed and are negative.    Physical Exam/Data:   Vitals:   08/08/20 1002 08/08/20 1003  BP: 105/80   Pulse: (!) 161   Resp: 11   Temp: 98.2 F (36.8 C)   TempSrc: Oral   SpO2: 98%   Weight:  61.2 kg  Height:  5\' 6"  (1.676 m)    Intake/Output Summary (Last 24 hours) at 08/08/2020 1538 Last data filed at 08/08/2020 1142 Gross per 24 hour  Intake 107.61 ml  Output --  Net 107.61 ml   Last 3 Weights 08/08/2020 07/29/2020 07/25/2020  Weight (lbs) 135 lb 131 lb 132 lb 3 oz  Weight (kg) 61.236 kg 59.421 kg 59.96 kg     Body mass index is 21.79 kg/m.  General:  Well nourished, well developed, in no acute distress, appears depressed HEENT: normal Lymph: no  adenopathy Neck: no JVD Endocrine:  No thryomegaly Vascular: No carotid bruits; FA pulses 2+ bilaterally without bruits  Cardiac: Tachycardic, irregularly irregular, ; RRR; no murmur  Lungs:  clear to  auscultation bilaterally, no wheezing, rhonchi or rales  Abd: soft, nontender, no hepatomegaly  Ext: no edema Musculoskeletal:  No deformities, BUE and BLE strength normal and equal Skin: warm and dry  Neuro:  CNs 2-12 intact, no focal abnormalities noted Psych:  Normal affect   EKG:  The EKG was personally reviewed and demonstrates:   Atrial fibrillation ventricular rate 163 bpm ST-T wave abnormality concerning for ischemia  Telemetry:  Telemetry was personally reviewed and demonstrates: Atrial fibrillation rate 140 bpm  Relevant CV Studies: Echocardiogram pending  Laboratory Data:  High Sensitivity Troponin:   Recent Labs  Lab 08/08/20 0955 08/08/20 1338  TROPONINIHS 25* 60*     Chemistry Recent Labs  Lab 08/08/20 0821  NA 133*  K 2.9*  CL 93*  CO2 29  GLUCOSE 117*  BUN 10  CREATININE <0.30*  CALCIUM 8.7*  GFRNONAA NOT CALCULATED  ANIONGAP 11    Recent Labs  Lab 08/08/20 0821  PROT 7.1  ALBUMIN 3.4*  AST 12*  ALT 11  ALKPHOS 101  BILITOT 0.8   Hematology Recent Labs  Lab 08/08/20 0821  WBC 18.3*  RBC 3.75*  HGB 12.7  HCT 36.2  MCV 96.5  MCH 33.9  MCHC 35.1  RDW 13.2  PLT 425*   BNP Recent Labs  Lab 08/08/20 0955  BNP 233.2*    DDimer No results for input(s): DDIMER in the last 168 hours.   Radiology/Studies:  DG Chest 2 View  Result Date: 08/08/2020 CLINICAL DATA:  Tachycardia EXAM: CHEST - 2 VIEW COMPARISON:  August 05, 2020 FINDINGS: The cardiomediastinal silhouette is unchanged in contour.LEFT chest port with tip terminating over the SVC. No pleural effusion. No pneumothorax. No acute pleuroparenchymal abnormality. Visualized abdomen is unremarkable. Multilevel degenerative changes of the thoracic spine. IMPRESSION: No acute  cardiopulmonary abnormality. Electronically Signed   By: Valentino Saxon MD   On: 08/08/2020 10:30   DG Chest Port 1 View  Result Date: 08/05/2020 CLINICAL DATA:  Status post Port-A-Cath placement EXAM: PORTABLE CHEST 1 VIEW COMPARISON:  None. FINDINGS: Left-sided Port-A-Cath with the tip projecting over the SVC. No focal consolidation. No pleural effusion or pneumothorax. Heart and mediastinal contours are unremarkable. No acute osseous abnormality. IMPRESSION: Left-sided Port-A-Cath with the tip projecting over the SVC. Electronically Signed   By: Kathreen Devoid   On: 08/05/2020 11:52   DG C-Arm 1-60 Min-No Report  Result Date: 08/05/2020 Fluoroscopy was utilized by the requesting physician.  No radiographic interpretation.     Assessment and Plan:   1. Persistent atrial fibrillation Timing of onset unclear, possibly 5 days ago October 2 when general malaise fatigue started -Not a good candidate for cardioversion unless she is willing to proceed with transesophageal echo first -We will try medical management first with diltiazem infusion Currently at 15mg /h still with rapid rate -We will start on metoprolol tartrate 25 every 6, give dose of digoxin 0.25 mg x 1 with possible repeat in several hours time --Long discussion with her concerning management of atrial fibrillation, need for anticoagulation -We will start heparin -If she converts to normal sinus rhythm, will be able to transition to oral diltiazem -Would likely benefit from start of NOAC once echocardiogram results are available  Maxillary sinus cancer Start of chemotherapy delayed, We will try to expedite work-up of her atrial fibrillation so she can start treatment  Hypokalemia Reports anorexia, Unclear if she is having diarrhea Will need to be repleted Currently on IV potassium supplement/repletion in the ER  Elevated troponin  Demand ischemia in the setting of atrial fibrillation with RVR and hypotension Would trend  troponin, already on heparin for above  Long discussion with family at the bedside concerning presentation, atrial fibrillation management  Total encounter time more than 110 minutes  Greater than 50% was spent in counseling and coordination of care with the patient  For questions or updates, please contact [place] Please consult [email or web address] for contact info under    Signed, Ida Rogue, MD  08/08/2020 3:38 PM

## 2020-08-08 NOTE — Telephone Encounter (Signed)
Called to infusion suite at request of Dr. Tasia Catchings who reported patient tachycardic, hypotensive, and symptomatic with weakness. EKG reviewed shows st depression in inferior leads; question demand ischemia, rate in 160s with PACs. Initiated fluid bolus. Pressures improving. Pulse became irregular- question new onset afib with rvr. Dr. Per patient, no history of a fib and not on blood thinner. Tasia Catchings also independently reviewed ekg and recommends er for evaluation and further management. Patient agreeable. Nursing to transport. Called ER, spoke to triage nurse who awaits patient's arrival. Called patient's friend & roommate [name], updated her regarding transfer.

## 2020-08-08 NOTE — ED Notes (Signed)
Critical Troponin level 103 per [facility] in the lab. Rachael Fee notified.

## 2020-08-08 NOTE — ED Notes (Signed)
Patient transported to CT 

## 2020-08-08 NOTE — Progress Notes (Signed)
ANTICOAGULATION CONSULT NOTE - Initial Consult  Pharmacy Consult for heparin infusion  Indication: atrial fibrillation  Allergies  Allergen Reactions  . Aspirin Swelling and Other (See Comments)  . Belladonna Alkaloids Rash  . Fluoxetine Hcl Other (See Comments) and Rash  . Naproxen Swelling, Rash and Other (See Comments)  . Phenobarbital Rash and Other (See Comments)  . Prozac [Fluoxetine Hcl] Rash   Patient Measurements: Height: 5\' 6"  (167.6 cm) Weight: 61.2 kg (135 lb) IBW/kg (Calculated) : 59.3  Vital Signs: Temp: 98.2 F (36.8 C) (10/07 1002) Temp Source: Oral (10/07 1002) BP: 105/80 (10/07 1002) Pulse Rate: 161 (10/07 1002)  Labs: Recent Labs    08/08/20 0821 08/08/20 0955  HGB 12.7  --   HCT 36.2  --   PLT 425*  --   CREATININE <0.30*  --   TROPONINIHS  --  25*    CrCl cannot be calculated (This lab value cannot be used to calculate CrCl because it is not a number: <0.30).  Medical History: Past Medical History:  Diagnosis Date  . 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   Marland Kitchen Anxiety   . Arthritis   . Bipolar disorder ([place])   . COPD (chronic obstructive pulmonary disease) ([place])    NO INHALERS  . Depression   . Dysrhythmia    H/O V TACH  . Head and neck cancer ([place]) 07/29/2020  . Headache    H/O MIGRAINES  . Hypertension   . Seizures ([place])    X1 AFTER FALL   Assessment: 64 yo female with new onset A. Fib with RVR. Pharmacy consulted for heparin infusion dosing and monitoring.   Goal of Therapy:  Heparin level 0.3-0.7 units/ml Monitor platelets by anticoagulation protocol: Yes   Plan:  Baseline labs ordered  Give 3800 units bolus x 1 Start heparin infusion at 900 units/hr Check anti-Xa level in 6 hours and daily while on heparin Continue to monitor H&H and platelets. CBC daily per protocol.   Pernell Dupre, PharmD, BCPS Clinical Pharmacist 08/08/2020 12:24 PM

## 2020-08-08 NOTE — H&P (Signed)
History and Physical    Shannon Schroeder TLX:[id number] DOB: 06/07/1956 DOA: 08/08/2020  Referring MD/NP/PA:   PCP: Pcp, No   Patient coming from:  The patient is coming from home.  At baseline, pt is independent for most of ADL.        Chief Complaint: Tachycardia  HPI: Shannon Schroeder is a 64 y.o. female with medical history significant of head/neck and maxillary sinus CA, HTN, COPD, TIA, tobacco abuse, aneurysm of anterior cerebral artery, bipolar, depression, who presents with tachycardia.  Pt was recently diagnosed today with head/neck and maxillary sinus cancer. Pt went for radiation treatment today. She started feeling bad and weak. She was found to have tachycardia and hypotension.  Blood pressure improved from 68/57 105/80 after giving 500 cc normal saline bolus. Her heart rate was 160-170s. EKG showed a fib with RVR. No hx of A fib.  Patient does not have chest pain, cough, fever or chills. Pt had nausea and vomited while in exam room.    Currently no abdominal pain or diarrhea.  No symptoms of UTI.  Patient denies any recent fall or head injury.  No rectal bleeding or dark stool.   ED Course: pt was found to have D-dimer 608, WBC 18.3, negative Covid PCR, lactic acid 2.0 -->1.8, troponin level 25, BNP 233, potassium 2.9, magnesium 2.1, renal function okay, , RR 20, oxygen saturation 98% on room air chest x-ray negative.  Patient is placed on progressive benefit observation.  Cardiology and oncology were consulted.  Review of Systems:   General: no fevers, chills, no body weight gain, has fatigue HEENT: no blurry vision, hearing changes or sore throat Respiratory: no dyspnea, coughing, wheezing CV: no chest pain, no palpitations GI: has nausea, vomiting, no abdominal pain, diarrhea, constipation GU: no dysuria, burning on urination, increased urinary frequency, hematuria  Ext: no leg edema Neuro: no unilateral weakness, numbness, or tingling, no vision change or hearing  loss Skin: no rash, no skin tear. MSK: No muscle spasm, no deformity, no limitation of range of movement in spin Heme: No easy bruising.  Travel history: No recent long distant travel.  Allergy:  Allergies  Allergen Reactions  . Aspirin Swelling and Other (See Comments)  . Belladonna Alkaloids Rash  . Fluoxetine Hcl Other (See Comments) and Rash  . Naproxen Swelling, Rash and Other (See Comments)  . Phenobarbital Rash and Other (See Comments)  . Prozac [Fluoxetine Hcl] Rash    Past Medical History:  Diagnosis Date  . 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   Marland Kitchen Anxiety   . Arthritis   . Bipolar disorder ([place])   . COPD (chronic obstructive pulmonary disease) ([place])    NO INHALERS  . Depression   . Dysrhythmia    H/O V TACH  . Head and neck cancer ([place]) 07/29/2020  . Headache    H/O MIGRAINES  . Hypertension   . Seizures ([place])    X1 AFTER FALL    Past Surgical History:  Procedure Laterality Date  . ABDOMINAL HYSTERECTOMY     partial  . BREAST SURGERY     biopsy  . CARDIAC CATHETERIZATION     X 2  . CARPAL TUNNEL RELEASE Right   . CARPECTOMY HAND Right   . CATARACT EXTRACTION W/PHACO Left 03/08/2018   Procedure: CATARACT EXTRACTION PHACO AND INTRAOCULAR LENS PLACEMENT (IOC);  Surgeon: Birder Robson, MD;  Location: [facility] ORS;  Service: Ophthalmology;  Laterality: Left;  [place]  00:55 AP% 13.5 CDE 7.52 Fluid pack lot # [id number]  . CATARACT EXTRACTION W/PHACO Right 04/05/2018   Procedure: CATARACT EXTRACTION PHACO AND INTRAOCULAR LENS PLACEMENT (IOC);  Surgeon: Birder Robson, MD;  Location: [facility] ORS;  Service: Ophthalmology;  Laterality: Right;  [place] 00:36 AP% 15.9 CDE 5.72 Fluid pack lot # [id number]  . CEREBRAL ANEURYSM REPAIR    . CHOLECYSTECTOMY    . COLONOSCOPY WITH PROPOFOL N/A 04/26/2019   Procedure: COLONOSCOPY WITH PROPOFOL;  Surgeon: Jonathon Bellows, MD;  Location[facility] ENDOSCOPY;  Service: Gastroenterology;  Laterality:  N/A;  . PORTACATH PLACEMENT Left 08/05/2020   Procedure: INSERTION PORT-A-CATH;  Surgeon: Herbert Pun, MD;  Location: [facility] ORS;  Service: General;  Laterality: Left;  . TUBAL LIGATION      Social History:  reports that she has been smoking cigarettes. She has a 50.00 pack-year smoking history. She has never used smokeless tobacco. She reports that she does not drink alcohol and does not use drugs.  Family History:  Family History  Problem Relation Age of Onset  . Hypertension Mother   . Heart failure Mother   . Hypertension Brother   . Stroke Brother   . Hypertension Son   . Emphysema Maternal Aunt   . Hypertension Paternal Aunt   . Hypertension Paternal Uncle   . Hypertension Maternal Grandmother   . Hypertension Paternal Grandmother      Prior to Admission medications   Medication Sig Start Date End Date Taking? Authorizing Provider  acetaminophen (TYLENOL 8 HOUR ARTHRITIS PAIN) 650 MG CR tablet Take 1,300 mg by mouth daily as needed (Pain).     [provider]  b complex vitamins capsule Take 1 capsule by mouth daily. Gummie    [provider]  fentaNYL (DURAGESIC) 25 MCG/HR Place 1 patch onto the skin every 3 (three) days. 07/29/20   Earlie Server, MD  gabapentin (NEURONTIN) 300 MG capsule Take 1 capsule (300 mg total) by mouth 3 (three) times daily. 07/19/20   Borders, Kirt Boys, NP  Ginkgo Biloba (GINKOBA PO) Take 1 tablet by mouth daily.     [provider]  HYDROcodone-acetaminophen (NORCO) 5-325 MG tablet Take 1 tablet by mouth every 4 (four) hours as needed for up to 3 days for moderate pain. 08/05/20 08/08/20  Herbert Pun, MD  ibuprofen (ADVIL) 400 MG tablet Take 400 mg by mouth every 6 (six) hours as needed for mild pain or moderate pain. Liquid Gel    [provider]  lidocaine-prilocaine (EMLA) cream Apply to affected area once Patient taking differently: Apply 1 application topically as needed [facility]). Apply to affected area  once 07/29/20   Earlie Server, MD  lisinopril (ZESTRIL) 20 MG tablet Take 1 tablet (20 mg total) by mouth daily. Patient not taking: Reported on 08/08/2020 07/29/20   Earlie Server, MD  lisinopril (ZESTRIL) 30 MG tablet Take 30 mg by mouth daily.    [provider]  Melatonin 10 MG TABS Take 10 mg by mouth at bedtime as needed (sleep).     [provider]  Multiple Vitamins-Minerals (CENTRUM SILVER PO) Take 1 tablet by mouth daily. Gummie    [provider]  oxyCODONE-acetaminophen (PERCOCET) 5-325 MG tablet Take 1 tablet by mouth every 4 (four) hours as needed for severe pain. 07/12/20 07/12/21  Earlie Server, MD  prochlorperazine (COMPAZINE) 10 MG tablet Take 1 tablet (10 mg total) by mouth every 6 (six) hours as needed (Nausea or vomiting). 07/29/20   Earlie Server, MD  Physical Exam: Vitals:   08/08/20 1002 08/08/20 1003  BP: 105/80   Pulse: (!) 161   Resp: 11   Temp: 98.2 F (36.8 C)   TempSrc: Oral   SpO2: 98%   Weight:  61.2 kg  Height:  5\' 6"  (1.676 m)   General: Not in acute distress HEENT: has right facial swelling       Eyes: PERRL, EOMI, no scleral icterus.       ENT: No discharge from the ears and nose, no pharynx injection, no tonsillar enlargement.        Neck: No JVD, no bruit, no mass felt. Heme: No neck lymph node enlargement. Cardiac: S1/S2, irregularly irregular rhythm, No murmurs, No gallops or rubs. Respiratory: No rales, wheezing, rhonchi or rubs. GI: Soft, nondistended, nontender, no rebound pain, no organomegaly, BS present. GU: No hematuria Ext: No pitting leg edema bilaterally. 2+DP/PT pulse bilaterally. Musculoskeletal: No joint deformities, No joint redness or warmth, no limitation of ROM in spin. Skin: No rashes.  Neuro: Alert, oriented X3, cranial nerves II-XII grossly intact, moves all extremities normally. Psych: Patient is not psychotic, no suicidal or hemocidal ideation.  Labs on Admission: I have personally reviewed following labs and  imaging studies  CBC: Recent Labs  Lab 08/08/20 0821  WBC 18.3*  NEUTROABS 15.5*  HGB 12.7  HCT 36.2  MCV 96.5  PLT 951*   Basic Metabolic Panel: Recent Labs  Lab 08/08/20 0821  NA 133*  K 2.9*  CL 93*  CO2 29  GLUCOSE 117*  BUN 10  CREATININE <0.30*  CALCIUM 8.7*  MG 2.1   GFR: CrCl cannot be calculated (This lab value cannot be used to calculate CrCl because it is not a number: <0.30). Liver Function Tests: Recent Labs  Lab 08/08/20 0821  AST 12*  ALT 11  ALKPHOS 101  BILITOT 0.8  PROT 7.1  ALBUMIN 3.4*   No results for input(s): LIPASE, AMYLASE in the last 168 hours. No results for input(s): AMMONIA in the last 168 hours. Coagulation Profile: No results for input(s): INR, PROTIME in the last 168 hours. Cardiac Enzymes: No results for input(s): CKTOTAL, CKMB, CKMBINDEX, TROPONINI in the last 168 hours. BNP (last 3 results) No results for input(s): PROBNP in the last 8760 hours. HbA1C: No results for input(s): HGBA1C in the last 72 hours. CBG: No results for input(s): GLUCAP in the last 168 hours. Lipid Profile: No results for input(s): CHOL, HDL, LDLCALC, TRIG, CHOLHDL, LDLDIRECT in the last 72 hours. Thyroid Function Tests: No results for input(s): TSH, T4TOTAL, FREET4, T3FREE, THYROIDAB in the last 72 hours. Anemia Panel: No results for input(s): VITAMINB12, FOLATE, FERRITIN, TIBC, IRON, RETICCTPCT in the last 72 hours. Urine analysis:    Component Value Date/Time   COLORURINE YELLOW (A) 08/08/2020 1144   APPEARANCEUR HAZY (A) 08/08/2020 1144   APPEARANCEUR Clear 12/21/2018 1041   LABSPEC 1.006 08/08/2020 1144   LABSPEC 1.017 04/23/2015 0000   PHURINE 8.0 08/08/2020 1144   GLUCOSEU NEGATIVE 08/08/2020 1144   HGBUR NEGATIVE 08/08/2020 1144   BILIRUBINUR NEGATIVE 08/08/2020 1144   BILIRUBINUR Negative 12/21/2018 [place] 08/08/2020 1144   PROTEINUR NEGATIVE 08/08/2020 1144   NITRITE NEGATIVE 08/08/2020 1144   LEUKOCYTESUR  NEGATIVE 08/08/2020 1144   Sepsis Labs: @LABRCNTIP (procalcitonin:4,lacticidven:4) ) Recent Results (from the past 240 hour(s))  SARS CORONAVIRUS 2 (TAT 6-24 HRS) Nasopharyngeal Nasopharyngeal Swab     Status: None   Collection Time: 08/01/20 10:18 AM   Specimen: Nasopharyngeal Swab  Result Value Ref Range Status   SARS Coronavirus 2 NEGATIVE NEGATIVE Final    Comment: (NOTE) SARS-CoV-2 target nucleic acids are NOT DETECTED.  The SARS-CoV-2 RNA is generally detectable in upper and lower respiratory specimens during the acute phase of infection. Negative results do not preclude SARS-CoV-2 infection, do not rule out co-infections with other pathogens, and should not be used as the sole basis for treatment or other patient management decisions. Negative results must be combined with clinical observations, patient history, and epidemiological information. The expected result is Negative.  Fact Sheet for Patients: SugarRoll.be  Fact Sheet for Healthcare Providers: https://www.woods-mathews.com/  This test is not yet approved or cleared by the [place] FDA and  has been authorized for detection and/or diagnosis of SARS-CoV-2 by FDA under an Emergency Use Authorization (EUA). This EUA will remain  in effect (meaning this test can be used) for the duration of the COVID-19 declaration under Se ction 564(b)(1) of the Act, 21 U.S.C. section 360bbb-3(b)(1), unless the authorization is terminated or revoked sooner.  Performed at [facility], [place] [address] [place], [place] [address]      Radiological Exams on Admission: DG Chest 2 View  Result Date: 08/08/2020 CLINICAL DATA:  Tachycardia EXAM: CHEST - 2 VIEW COMPARISON:  August 05, 2020 FINDINGS: The cardiomediastinal silhouette is unchanged in contour.LEFT chest port with tip terminating over the SVC. No pleural effusion. No pneumothorax. No acute pleuroparenchymal abnormality.  Visualized abdomen is unremarkable. Multilevel degenerative changes of the thoracic spine. IMPRESSION: No acute cardiopulmonary abnormality. Electronically Signed   By: Valentino Saxon MD   On: 08/08/2020 10:30     EKG: Independently reviewed.  Atrial fibrillation with RVR, QTC 493, ST depression in inferior leads and V3-V6, ST elevation in V1-V2 and in aVR.    Assessment/Plan Principal Problem:   Atrial fibrillation with RVR (HCC) Active Problems:   Hypertension   COPD (chronic obstructive pulmonary disease) (HCC)   Tobacco abuse   TIA (transient ischemic attack)   Head and neck cancer (HCC)   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HCC)   Elevated troponin   Leukocytosis   Hypokalemia   SIRS (systemic inflammatory response syndrome) (HCC)   Atrial fibrillation with RVR ([place]): this is new onset A fib with RVR. Card, Dr. Rockey Situ is consulted.  -will place on progressive bed for observation -Starting IV heparin -IV Cardizem drip -Dr. Rockey Situ started patient on digoxin and metoprolol -check TSH - 2d echo   Elevated troponin: trop 25.  Patient denies chest pain.  Likely due to demand ischemia. -Trend troponin -Patient is allergic to aspirin -Check A1c, FLP -Repeat EKG in morning -Follow-up 2D echo -Get CT angiogram to rule out PE and lower extremity Doppler to rule out DVT due to positive D-dimer  Hypertension -Hold blood pressure medication, lisinopril due to hypotension  COPD (chronic obstructive pulmonary disease) (HCC) -Bronchodilators  Tobacco abuse -Nicotine patch  History of TIA (transient ischemic attack): patient is not taking medications currently. -Follow-up with PCP  Head and neck cancer and Maxillary sinus cancer [facility]): Patient is receiving radiation therapy currently. -consulted Dr. Tasia Catchings of oncology  Hypokalemia: Magnesium 2.1 -Repleted potassium  Leukocytosis and SIRS (systemic inflammatory response syndrome) [facility]): Patient meets criteria for SIRS with  leukocytosis, tachycardia.  Lactic acid 2.0, 1.8.  No source of infection identified, therefore not a diagnosis of sepsis. No fever. -Check procalcitonin level -Will not give IV fluid since lactic acid is normalized.  And BNP is slightly elevated to 33. -f/u Blood  culture and UA    DVT ppx: on IV heparin Code Status: Full code ( dicussed with pt) Family Communication: not done, no family member is at bed side.  Disposition Plan:  Anticipate discharge back to previous environment Consults called:  Dr. Tasia Catchings of oncology and Dr. Rockey Situ of card Admission status: progressive unit for obs    Status is: Observation  The patient remains OBS appropriate and will d/c before 2 midnights.  Dispo: The patient is from: Home              Anticipated d/c is to: Home              Anticipated d/c date is: 1 day              Patient currently is not medically stable to d/c.            Date of Service 08/08/2020    Ivor Costa [facility]   If 7PM-7AM, please contact night-coverage [email or web address] 08/08/2020, 12:48 PM

## 2020-08-08 NOTE — Progress Notes (Signed)
Hematology/Oncology follow up note [facility] [phone] [phone]   Patient Care Team: Pcp, No as PCP - General Earlie Server, MD as Consulting Physician (Oncology)  CHIEF COMPLAINTS/REASON FOR VISIT:  Follow-up for head and neck cancer  HISTORY OF PRESENTING ILLNESS:   Shannon Schroeder is a  64 y.o.  female with PMH listed below was seen in consultation at the request of ER Dr. [name] for evaluation of abnormal CT scan.  07/10/2020 she presented emergency room for evaluation of right-sided facial swelling and pain in the right side of her mouth for the past 6 weeks.  It started in her mouth with a small ulcer which progressively got worse.  She wears denture.  Not able to chew well.  She eats soft food. She was found to have right upper gum also with yellowish discharge noted. 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  Avoid piloting.,  Right orbital apex, also the right soft palate and palate and tonsil.  Furthermore there is evidence of intracranial extension along the right V2 at the foramen rotundum Small suspicious right level 1A lymph node measuring 7 mm.  Separate small indeterminate but suspicious enhancing soft tissue nodule of the left sublingual space.  Patient was referred to oncology for further evaluation management. Patient denies fever, chills, nausea vomiting diarrhea shortness of breath or cough.  Reports 10 out of 10 severe pain of right face and numbness.  She also has headache. Reports unintentional weight loss  She reports no contactable family members.  She is in the process of pointing her roommate [name] to be her power of attorney.  She also wants to record today's conversation for her roommate [name]. Patient has 50-pack-year smoking history.  # seen by ENT  Dr.Juengel and had a biopsy. Biopsy was sent to [facility] and the final pathology was positive for invasive squamous cell carcinoma, well-differentiated, keratinizing. 07/17/2020, brain MRI showed large right facial mass with right maxillary erosion involving the maxillary sinus, hard palate and pterygoid plates and muscles.  Perineural spreading along the multiple branches of the maxillary division of the right trigeminal nerve at the infraorbital canal, pterygopalatine fossa and foramen rotundum. 07/17/2020, PET scan showed Intense FDG uptake is associated with the large enhancing tumor which involves the right maxilla, right nasal cavity, right pterygoid palatine fossa, right orbital apex and right soft tissue palate and palatine tonsil. 2. Mild nonspecific FDG uptake is associated with the recently characterized suspicious right level 1A lymph node which measures 7 mm and has an SUV max of 2.12.  No signs of distant metastasis.  Aortic atherosclerosis.  Patient also reports a history of hypertension, dysrhythmia, history of V. tach.  Patient previously was on lisinopril, atenolol, amlodipine for which she is no longer taking at this point due to lack of medication coverage.  She also does not have any primary care provider. 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  On average she says she is takes 2 to 3 pills/day.  She also takes gabapentin 300 mg 3 times daily   INTERVAL HISTORY Shannon Schroeder is a 64 y.o. female who has above history reviewed by me  today presents for follow up visit for management of head and neck cancer. Problems and complaints are listed below: Started on radiation today. She has nausea and vomiting which started this morning.  She has a heart rate of 170s, hypotensive. Afebrile. No chest  pain.   Review of Systems  Constitutional: Positive for appetite change, fatigue and unexpected weight change. Negative for chills and fever.  HENT:   Positive for mouth sores. Negative for hearing loss and voice change.        Facial swelling, pain and numbness  Eyes: Negative for eye problems.  Respiratory: Negative for chest tightness and cough.   Cardiovascular: Positive for palpitations. Negative for chest pain.  Gastrointestinal: Positive for nausea and vomiting. Negative for abdominal distention, abdominal pain and blood in stool.  Endocrine: Negative for hot flashes.  Genitourinary: Negative for difficulty urinating and frequency.   Musculoskeletal: Negative for arthralgias.  Skin: Negative for itching and rash.  Neurological: Negative for extremity weakness and headaches.  Hematological: Negative for adenopathy.  Psychiatric/Behavioral: Negative for confusion.    MEDICAL HISTORY:  Past Medical History:  Diagnosis Date  . 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   Marland Kitchen Anxiety   . Arthritis   . Bipolar disorder ([place])   . COPD (chronic obstructive pulmonary disease) ([place])    NO INHALERS  . Depression   . Dysrhythmia    H/O V TACH  . Head and neck cancer ([place]) 07/29/2020  . Headache    H/O MIGRAINES  . Hypertension   . Seizures ([place])    X1 AFTER FALL    SURGICAL HISTORY: Past Surgical History:  Procedure Laterality Date  . ABDOMINAL HYSTERECTOMY     partial  . BREAST SURGERY     biopsy  . CARDIAC CATHETERIZATION     X 2  . CARPAL TUNNEL RELEASE Right   . CARPECTOMY HAND Right   . CATARACT EXTRACTION W/PHACO Left 03/08/2018   Procedure: CATARACT EXTRACTION PHACO AND INTRAOCULAR LENS PLACEMENT (IOC);  Surgeon: Birder Robson, MD;  Location: [facility] ORS;  Service: Ophthalmology;  Laterality: Left;  [place] 00:55 AP% 13.5 CDE 7.52 Fluid pack lot # [id number]  . CATARACT EXTRACTION W/PHACO Right 04/05/2018   Procedure: CATARACT  EXTRACTION PHACO AND INTRAOCULAR LENS PLACEMENT (IOC);  Surgeon: Birder Robson, MD;  Location: [facility] ORS;  Service: Ophthalmology;  Laterality: Right;  [place] 00:36 AP% 15.9 CDE 5.72 Fluid pack lot # [id number]  . CEREBRAL ANEURYSM REPAIR    . CHOLECYSTECTOMY    . COLONOSCOPY WITH PROPOFOL N/A 04/26/2019   Procedure: COLONOSCOPY WITH PROPOFOL;  Surgeon: Jonathon Bellows, MD;  Location: [facility] ENDOSCOPY;  Service: Gastroenterology;  Laterality: N/A;  . PORTACATH PLACEMENT Left 08/05/2020   Procedure: INSERTION PORT-A-CATH;  Surgeon: Herbert Pun, MD;  Location: [facility] ORS;  Service: General;  Laterality: Left;  . TUBAL LIGATION      SOCIAL HISTORY: Social History   Socioeconomic History  . Marital status: Divorced    Spouse name: Not on file  . Number of children: Not on file  . Years of education: 33  . Highest education level: Bachelor's degree (e.g., BA, AB, BS)  Occupational History  . Occupation: farmer  Tobacco Use  . Smoking status: Current Every Day Smoker    Packs/day: 1.00    Years: 50.00    Pack years: 50.00    Types: Cigarettes  . Smokeless tobacco: Never Used  Vaping Use  . Vaping Use: Never used  Substance  and Sexual Activity  . Alcohol use: No  . Drug use: No  . Sexual activity: Not Currently  Other Topics Concern  . Not on file  Social History Narrative   Pt lives at [facility].    Social Determinants of Health   Financial Resource Strain:   . Difficulty of Paying Living Expenses: Not on file  Food Insecurity:   . Worried About Charity fundraiser in the Last Year: Not on file  . Ran Out of Food in the Last Year: Not on file  Transportation Needs:   . Lack of Transportation (Medical): Not on file  . Lack of Transportation (Non-Medical): Not on file  Physical Activity:   . Days of Exercise per Week: Not on file  . Minutes of Exercise per Session: Not on file  Stress:   . Feeling of Stress : Not on file  Social Connections:   . Frequency of  Communication with Friends and Family: Not on file  . Frequency of Social Gatherings with Friends and Family: Not on file  . Attends Religious Services: Not on file  . Active Member of Clubs or Organizations: Not on file  . Attends Archivist Meetings: Not on file  . Marital Status: Not on file  Intimate Partner Violence:   . Fear of Current or Ex-Partner: Not on file  . Emotionally Abused: Not on file  . Physically Abused: Not on file  . Sexually Abused: Not on file    FAMILY HISTORY: Family History  Problem Relation Age of Onset  . Hypertension Mother   . Heart failure Mother   . Hypertension Brother   . Stroke Brother   . Hypertension Son   . Emphysema Maternal Aunt   . Hypertension Paternal Aunt   . Hypertension Paternal Uncle   . Hypertension Maternal Grandmother   . Hypertension Paternal Grandmother     ALLERGIES:  is allergic to aspirin, belladonna alkaloids, fluoxetine hcl, naproxen, phenobarbital, and prozac [fluoxetine hcl].  MEDICATIONS:  Current Outpatient Medications  Medication Sig Dispense Refill  . acetaminophen (TYLENOL 8 HOUR ARTHRITIS PAIN) 650 MG CR tablet Take 1,300 mg by mouth daily as needed (Pain).     Marland Kitchen b complex vitamins capsule Take 1 capsule by mouth daily. Gummie    . fentaNYL (DURAGESIC) 25 MCG/HR Place 1 patch onto the skin every 3 (three) days. 5 patch 0  . gabapentin (NEURONTIN) 300 MG capsule Take 1 capsule (300 mg total) by mouth 3 (three) times daily. 90 capsule 0  . Ginkgo Biloba (GINKOBA PO) Take 1 tablet by mouth daily.     Marland Kitchen HYDROcodone-acetaminophen (NORCO) 5-325 MG tablet Take 1 tablet by mouth every 4 (four) hours as needed for up to 3 days for moderate pain. 10 tablet 0  . ibuprofen (ADVIL) 400 MG tablet Take 400 mg by mouth every 6 (six) hours as needed for mild pain or moderate pain. Liquid Gel    . lidocaine-prilocaine (EMLA) cream Apply to affected area once (Patient taking differently: Apply 1 application topically as  needed [facility]). Apply to affected area once) 30 g 3  . lisinopril (ZESTRIL) 30 MG tablet Take 30 mg by mouth daily.    . Melatonin 10 MG TABS Take 10 mg by mouth at bedtime as needed (sleep).     . Multiple Vitamins-Minerals (CENTRUM SILVER PO) Take 1 tablet by mouth daily. Gummie    . oxyCODONE-acetaminophen (PERCOCET) 5-325 MG tablet Take 1 tablet by mouth every 4 (four) hours  as needed for severe pain. 30 tablet 0  . prochlorperazine (COMPAZINE) 10 MG tablet Take 1 tablet (10 mg total) by mouth every 6 (six) hours as needed (Nausea or vomiting). 30 tablet 1  . lisinopril (ZESTRIL) 20 MG tablet Take 1 tablet (20 mg total) by mouth daily. (Patient not taking: Reported on 08/08/2020) 30 tablet 0   No current facility-administered medications for this visit.     PHYSICAL EXAMINATION: ECOG PERFORMANCE STATUS: 1 - Symptomatic but completely ambulatory Vitals:   08/08/20 0921  BP: (!) 82/61  Pulse: (!) 167  Temp: 97.8 F (36.6 C)  SpO2: 99%   There were no vitals filed for this visit.  Physical Exam Constitutional:      General: She is in acute distress.  HENT:     Head: Normocephalic and atraumatic.  Eyes:     General: No scleral icterus. Cardiovascular:     Rate and Rhythm: Normal rate and regular rhythm.     Heart sounds: Normal heart sounds.  Pulmonary:     Effort: Pulmonary effort is normal. No respiratory distress.     Breath sounds: No wheezing.  Abdominal:     General: Bowel sounds are normal. There is no distension.     Palpations: Abdomen is soft.  Musculoskeletal:        General: No deformity. Normal range of motion.     Cervical back: Normal range of motion and neck supple.  Skin:    General: Skin is warm and dry.     Findings: No erythema or rash.  Neurological:     Mental Status: She is alert and oriented to person, place, and time. Mental status is at baseline.     Cranial Nerves: No cranial nerve deficit.     Coordination: Coordination normal.  Psychiatric:         Mood and Affect: Mood normal.     LABORATORY DATA:  I have reviewed the data as listed Lab Results  Component Value Date   WBC 18.3 (H) 08/08/2020   HGB 12.7 08/08/2020   HCT 36.2 08/08/2020   MCV 96.5 08/08/2020   PLT 425 (H) 08/08/2020   Recent Labs    07/10/20 1311 07/12/20 1204 08/08/20 0821  NA 136 137 133*  K 2.9* 4.0 2.9*  CL 97* 98 93*  CO2 28 27 29   GLUCOSE 100* 114* 117*  BUN 13 8 10   CREATININE 0.43* 0.42* <0.30*  CALCIUM 8.9 9.0 8.7*  GFRNONAA >60 >60 NOT CALCULATED  GFRAA >60 >60  --   PROT 7.0  --  7.1  ALBUMIN 3.7  --  3.4*  AST 20  --  12*  ALT 16  --  11  ALKPHOS 106  --  101  BILITOT 0.5  --  0.8   Iron/TIBC/Ferritin/ %Sat    Component Value Date/Time   IRON 37 05/03/2018 1826   FERRITIN 52 05/03/2018 1826      RADIOGRAPHIC STUDIES: I have personally reviewed the radiological images as listed and agreed with the findings in the report. MR Brain W Wo Contrast  Result Date: 07/17/2020 CLINICAL DATA:  Right-sided facial pain for 4-5 months. Facial tumor. Possible perineural spread. EXAM: MRI HEAD WITHOUT AND WITH CONTRAST TECHNIQUE: Multiplanar, multiecho pulse sequences of the brain and surrounding structures were obtained without and with intravenous contrast. CONTRAST:  54mL GADAVIST GADOBUTROL 1 MMOL/ML IV SOLN COMPARISON:  Maxillofacial CT 07/10/2020 FINDINGS: Brain: No acute infarct, acute hemorrhage or extra-axial collection. Multifocal hyperintense T2-weighted signal within  the white matter. Normal volume of CSF spaces. No chronic microhemorrhage. Normal midline structures. There is abnormal thickening and contrast enhancement of the maxillary branch of the right trigeminal nerve within the right Meckel cave and foramen rotundum. Abnormal enhancement extends to the pterygopalatine fossa and along the lateral posterior aspect of the right maxillary sinus. There is also abnormal enhancement extending into the right pterygoid muscles.  Vascular: Normal flow voids. Skull and upper cervical spine: Large right facial mass erodes through the right maxilla including the floor of the maxillary sinus, the right half of the hard palate and the right maxillary alveolar process. Sinuses/Orbits: Erosive mass involving the right maxillary sinus. Otherwise, the paranasal sinuses are clear. There is abnormal enhancement along the right infraorbital nerve. Other: None IMPRESSION: 1. Large right facial mass with right maxillary erosion involving the maxillary sinus, hard palate and pterygoid plates and muscles. 2. Perineural spread along multiple branches of the maxillary division of the right trigeminal nerve at the infraorbital canal, pterygopalatine fossa and foramen rotundum. Electronically Signed   By: Ulyses Jarred M.D.   On: 07/17/2020 20:04   NM PET Image Initial (PI) Skull Base To Thigh  Result Date: 07/17/2020 CLINICAL DATA:  Initial treatment strategy for head and neck cancer. EXAM: NUCLEAR MEDICINE PET SKULL BASE TO THIGH TECHNIQUE: 7.45 mCi F-18 FDG was injected intravenously. Full-ring PET imaging was performed from the skull base to thigh after the radiotracer. CT data was obtained and used for attenuation correction and anatomic localization. Fasting blood glucose: 102 mg/dl COMPARISON:  CT maxillofacial with contrast 07/10/2020 FINDINGS: Mediastinal blood pool activity: SUV max 1.99 Liver activity: SUV max NA NECK: The previously characterized soft tissue mass involving the right maxilla, right buccal space, floor of right nasal cavity and right pterygoid palatine fossa, right orbital apex, right soft palate and right palatine tonsil is intensely FDG avid and has an SUV max of 13.74. The recently described suspicious, 7 mm right level 1a lymph node has an SUV max of 2.12. Incidental CT findings: none CHEST: No hypermetabolic mediastinal or hilar nodes. No suspicious pulmonary nodules on the CT scan. Incidental CT findings: Aortic  atherosclerosis. ABDOMEN/PELVIS: No abnormal hypermetabolic activity within the liver, pancreas, adrenal glands, or spleen. No hypermetabolic lymph nodes in the abdomen or pelvis. Incidental CT findings: Cholecystectomy.  Aortic atherosclerosis. SKELETON: No signs of FDG avid distant osseous metastasis. Incidental CT findings: none IMPRESSION: 1. Intense FDG uptake is associated with the large enhancing tumor which involves the right maxilla, right nasal cavity, right pterygoid palatine fossa, right orbital apex and right soft tissue palate and palatine tonsil. 2. Mild nonspecific FDG uptake is associated with the recently characterized suspicious right level 1A lymph node which measures 7 mm and has an SUV max of 2.12. 3. No signs of FDG avid distant metastatic disease. 4.  Aortic Atherosclerosis (ICD10-I70.0). Electronically Signed   By: Kerby Moors M.D.   On: 07/17/2020 15:26   CT Maxillofacial W Contrast  Result Date: 07/10/2020 CLINICAL DATA:  64 year old female with right side facial swelling and pain. Discolored areas of oral mucosa on that side. EXAM: CT MAXILLOFACIAL WITH CONTRAST TECHNIQUE: Multidetector CT imaging of the maxillofacial structures was performed with intravenous contrast. Multiplanar CT image reconstructions were also generated. CONTRAST:  43mL OMNIPAQUE IOHEXOL 300 MG/ML  SOLN COMPARISON:  CT head and CTA head and neck 06/15/2018. FINDINGS: Osseous: New since 2019, erosion throughout the floor of the right maxillary sinus, the right half of the hard palate (series 7,  image 26) and the entire right maxillary alveolar process. The right maxillary sinus was clear in 2019, but now demonstrates a combination of low-density and enhancing soft tissue, presumably tumor, which infiltrates through the eroded floor of the right maxilla and to the right buccal space and oral cavity (series 6, image 31). The soft tissue abnormality extends throughout much of the floor of the right nasal cavity.  The right pterygoid plates are also partially eroded. The abnormal enhancing soft tissue has indistinct margins but encompasses an area of roughly 5 cm (series 2, image 47, series 6, image 31). The right pterygoid palatine fossa is infiltrated (series 2, image 35). The posterior margin of tumor is in proximity to the right mandible on series 2, image 52, but the mandible and TMJ appear spared. The clivus and remaining central skull base also appear intact. Right frontotemporal craniotomy changes are new since 2019. No other bone erosion or suspicious bone lesion identified. Orbits: The right orbital floor remains intact but is abutted by abnormal soft tissue in the right maxillary sinus. There may be tumor at the right orbital apex tracking cephalad from the pterygoid palatine fossa, but otherwise no mass or inflammation within the right orbit. The other orbital walls are intact. The left orbit also appears negative. Sinuses: Destruction of the right maxilla with abnormal enhancing and low-density masslike soft tissue within the sinus in involving the floor of the right nasal cavity as stated above. Elsewhere the nasal cavity and other paranasal sinuses seem to remain normal. Mild retained secretions and/or mucosal thickening suspected in the right sphenoid sinus. The tympanic cavities and mastoids are clear. Soft tissues: Bulky masslike soft tissue centered at the right maxillary alveolus and involving the right buccal space and oral cavity (series 2, image 55). Involvement of the anterior right pterygoid musculature. The remaining right masticator space appears spared. In addition to cephalad spread to the right pterygoid palatine fossa there appears to be contiguous tumor along the soft palate and right tonsillar pillar on series 2, image 48. The remaining pharyngeal contours are within normal limits. The right parapharyngeal and retropharyngeal spaces remain within normal limits. Central sublingual space,  submandibular glands and parotid glands are within normal limits. Small but asymmetric 7 mm right level 2A lymph node on series 2, image 70. On image 69 in the left lateral sublingual space there is an indeterminate 7 mm enhancing soft tissue nodule. This seems too posterior for the left sublingual gland. Bilateral level 2 lymph nodes measure 7-9 mm short axis and appears symmetric. The visible major vascular structures in the upper neck appear patent. Limited intracranial: The right cavernous sinus seems to remain patent. However, there is a subtle asymmetric enhancing soft tissue nodule measuring 4-5 mm at the right foramen rotundum and abutting the cavernous sinus on series 6, image 46. Definite intracranial 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 IMPRESSION: 1. Bulky and indistinct enhancing tumor which has aggressively destroyed the right maxilla, infiltrates along the right buccal space, floor of the right nasal cavity, into the right pterygoid palatine fossa, the right orbital apex, and also the right soft palate and palatine tonsil. Furthermore, there is evidence of intracranial extension along the right V2 at the foramen rotundum. 2. Small but suspicious right level 1A lymph node measuring 7 mm. And there is a separate small indeterminate but suspicious enhancing soft tissue nodule of the left sublingual space. Electronically Signed   By: Genevie Ann M.D.   On: 07/10/2020 16:26  DG Chest Port 1 View  Result Date: 08/05/2020 CLINICAL DATA:  Status post Port-A-Cath placement EXAM: PORTABLE CHEST 1 VIEW COMPARISON:  None. FINDINGS: Left-sided Port-A-Cath with the tip projecting over the SVC. No focal consolidation. No pleural effusion or pneumothorax. Heart and mediastinal contours are unremarkable. No acute osseous abnormality. IMPRESSION: Left-sided Port-A-Cath with the tip projecting over the SVC. Electronically Signed   By: Kathreen Devoid   On: 08/05/2020 11:52   DG C-Arm 1-60 Min-No  Report  Result Date: 08/05/2020 Fluoroscopy was utilized by the requesting physician.  No radiographic interpretation.      ASSESSMENT & PLAN:  1. 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[place])   2. Encounter for antineoplastic chemotherapy   3. Neoplasm related pain   4. Tachycardia   5. Hypotension, unspecified hypotension type   6. Hypokalemia    # Tachycardia, nasuea and vomiting, hypotension with BP 82/61 Patient initially did not want to go to ER for evaluation.  EKG here at infusion center showed sinus tachycardia, ST abnormality.  Discussed with patient and she eventually agrees with going to ER for evaluation.  Started on IV fluid with normal saline.   # Hypokalemia, K 2.9. pending ER evaluation and management.   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  S/p 1st RT.  Hold chemotherapy today.   #neoplasm induced pain,  Continue percocet q4 hours as needed for pain.  fentanyl patch 78mcg/h Q72 hours. .  Continue garbapentine 300mg  TID.   # Weight loss  Continue nutrition supplementation. Follow up with nutritionist.    All questions were answered. The patient knows to call the clinic with any problems questions or concerns.   Return of visit: TBD  Earlie Server, MD, PhD Hematology Oncology [facility] Pager- [phone] 08/08/2020

## 2020-08-08 NOTE — ED Provider Notes (Signed)
[facility] Emergency Department Provider Note  ____________________________________________   First MD Initiated Contact with Patient 08/08/20 [phone]     (approximate)  I have reviewed the triage vital signs and the nursing notes.   HISTORY  Chief Complaint Tachycardia   HPI Shannon Schroeder is a 64 y.o. female with a past medical history of COPD, anxiety, arthritis, bipolar disorder, V. tach, HTN and patient diagnosis of invasive squamous cell carcinoma currently undergoing going radiation therapy with migration dose today who presents from the cancer center after she was found to be tachycardic.  Patient states she got radiation today.  She states she vomited once today and feels very tired and a little bit cold but denies any other symptoms.  She denies any fevers, chills, headache, earache, sore throat, chest pain, cough, diarrhea, dysuria, dental pain, back pain, rash, extremity pain, other acute complaints.  She denies any history of CAD or A. fib.  She is not on a blood thinner.    Patient did receive 1 L of IV fluid prior to arrival.     Past Medical History:  Diagnosis Date  . 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   Marland Kitchen Anxiety   . Arthritis   . Bipolar disorder ([place])   . COPD (chronic obstructive pulmonary disease) ([place])    NO INHALERS  . Depression   . Dysrhythmia    H/O V TACH  . Head and neck cancer ([place]) 07/29/2020  . Headache    H/O MIGRAINES  . Hypertension   . Seizures ([place])    X1 AFTER FALL    Patient Active Problem List   Diagnosis Date Noted  . Encounter for antineoplastic chemotherapy 08/08/2020  . Atrial fibrillation with RVR ([place]) 08/08/2020  . Elevated troponin 08/08/2020  . Leukocytosis 08/08/2020  . Hypokalemia 08/08/2020  . SIRS (systemic inflammatory response syndrome) ([place]) 08/08/2020  . Goals of care, counseling/discussion 08/06/2020  . Head and neck cancer ([place])  07/29/2020  . 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 Weight loss 07/29/2020  . Neoplasm related pain 07/29/2020  . Abscess of upper gum 09/12/2019  . History of ventricular tachycardia 03/22/2019  . Bipolar affective disorder, current episode mixed ([place]) 06/29/2018  . TIA (transient ischemic attack) 06/15/2018  . Tobacco abuse 06/02/2018  . COPD (chronic obstructive pulmonary disease) ([place]) 04/21/2018  . URI (upper respiratory infection) 03/31/2018  . Hypertension 03/31/2018  . Tachycardia 02/02/2018  . Rash of body 02/02/2018  . Hyponatremia 02/02/2018    Past Surgical History:  Procedure Laterality Date  . ABDOMINAL HYSTERECTOMY     partial  . BREAST SURGERY     biopsy  . CARDIAC CATHETERIZATION     X 2  . CARPAL TUNNEL RELEASE Right   . CARPECTOMY HAND Right   . CATARACT EXTRACTION W/PHACO Left 03/08/2018   Procedure: CATARACT EXTRACTION PHACO AND INTRAOCULAR LENS PLACEMENT (IOC);  Surgeon: Birder Robson, MD;  Location: [facility] ORS;  Service: Ophthalmology;  Laterality: Left;  [place] 00:55 AP% 13.5 CDE 7.52 Fluid pack lot # [id number]  . CATARACT EXTRACTION W/PHACO Right 04/05/2018   Procedure: CATARACT EXTRACTION PHACO AND INTRAOCULAR LENS PLACEMENT (IOC);  Surgeon: Birder Robson, MD;  Location: [facility] ORS;  Service: Ophthalmology;  Laterality: Right;  [place] 00:36 AP% 15.9 CDE 5.72 Fluid pack lot # [id number]  . CEREBRAL ANEURYSM REPAIR    . CHOLECYSTECTOMY    . COLONOSCOPY WITH PROPOFOL N/A 04/26/2019   Procedure: COLONOSCOPY WITH  PROPOFOL;  Surgeon: Jonathon Bellows, MD;  Location: [facility] ENDOSCOPY;  Service: Gastroenterology;  Laterality: N/A;  . PORTACATH PLACEMENT Left 08/05/2020   Procedure: INSERTION PORT-A-CATH;  Surgeon: Herbert Pun, MD;  Location: [facility] ORS;  Service: General;  Laterality: Left;  . TUBAL LIGATION      Prior to Admission medications   Medication Sig Start Date End Date Taking? Authorizing Provider  b complex vitamins capsule Take 1 capsule by  mouth daily. Gummie   Yes [provider]  fentaNYL (DURAGESIC) 25 MCG/HR Place 1 patch onto the skin every 3 (three) days. 07/29/20  Yes Earlie Server, MD  gabapentin (NEURONTIN) 300 MG capsule Take 1 capsule (300 mg total) by mouth 3 (three) times daily. 07/19/20  Yes Borders, Kirt Boys, NP  Ginkgo Biloba (GINKOBA PO) Take 1 tablet by mouth daily.    Yes [provider]  HYDROcodone-acetaminophen (NORCO) 5-325 MG tablet Take 1 tablet by mouth every 4 (four) hours as needed for up to 3 days for moderate pain. 08/05/20 08/08/20 Yes Herbert Pun, MD  lisinopril (ZESTRIL) 20 MG tablet Take 1 tablet (20 mg total) by mouth daily. Patient taking differently: Take 30 mg by mouth daily.  07/29/20  Yes Earlie Server, MD  Multiple Vitamins-Minerals (CENTRUM SILVER PO) Take 1 tablet by mouth daily. Gummie   Yes [provider]  acetaminophen (TYLENOL 8 HOUR ARTHRITIS PAIN) 650 MG CR tablet Take 1,300 mg by mouth daily as needed (Pain).     [provider]  ibuprofen (ADVIL) 400 MG tablet Take 400 mg by mouth every 6 (six) hours as needed for mild pain or moderate pain. Liquid Gel    [provider]  lidocaine-prilocaine (EMLA) cream Apply to affected area once Patient taking differently: Apply 1 application topically as needed [facility]). Apply to affected area once 07/29/20   Earlie Server, MD  Melatonin 10 MG TABS Take 10 mg by mouth at bedtime as needed (sleep).     [provider]  oxyCODONE-acetaminophen (PERCOCET) 5-325 MG tablet Take 1 tablet by mouth every 4 (four) hours as needed for severe pain. 07/12/20 07/12/21  Earlie Server, MD  prochlorperazine (COMPAZINE) 10 MG tablet Take 1 tablet (10 mg total) by mouth every 6 (six) hours as needed (Nausea or vomiting). 07/29/20   Earlie Server, MD    Allergies Aspirin, Belladonna alkaloids, Fluoxetine hcl, Naproxen, Phenobarbital, and Prozac [fluoxetine hcl]  Family History  Problem Relation Age of Onset  . Hypertension Mother     . Heart failure Mother   . Hypertension Brother   . Stroke Brother   . Hypertension Son   . Emphysema Maternal Aunt   . Hypertension Paternal Aunt   . Hypertension Paternal Uncle   . Hypertension Maternal Grandmother   . Hypertension Paternal Grandmother     Social History Social History   Tobacco Use  . Smoking status: Current Every Day Smoker    Packs/day: 1.00    Years: 50.00    Pack years: 50.00    Types: Cigarettes  . Smokeless tobacco: Never Used  Vaping Use  . Vaping Use: Never used  Substance Use Topics  . Alcohol use: No  . Drug use: No    Review of Systems  Review of Systems  Constitutional: Positive for malaise/fatigue. Negative for chills and fever.  HENT: Negative for sore throat.   Eyes: Negative for pain.  Respiratory: Negative for cough and stridor.   Cardiovascular: Negative for chest pain.  Gastrointestinal: Positive for nausea and vomiting.  Genitourinary: Negative for dysuria.  Musculoskeletal: Negative for myalgias.  Skin: Negative for rash.  Neurological: Negative for seizures, loss of consciousness and headaches.  Psychiatric/Behavioral: Negative for suicidal ideas.  All other systems reviewed and are negative.     ____________________________________________   PHYSICAL EXAM:  VITAL SIGNS: ED Triage Vitals  Enc Vitals Group     BP      Pulse      Resp      Temp      Temp src      SpO2      Weight      Height      Head Circumference      Peak Flow      Pain Score      Pain Loc      Pain Edu?      Excl. in [place]?    Vitals:   08/08/20 1002  BP: 105/80  Pulse: (!) 161  Resp: 11  Temp: 98.2 F (36.8 C)  SpO2: 98%   Physical Exam Vitals and nursing note reviewed.  Constitutional:      General: She is not in acute distress.    Appearance: Normal appearance. She is well-developed and normal weight. She is ill-appearing.  HENT:     Head: Normocephalic and atraumatic.     Right Ear: External ear normal.     Left Ear:  External ear normal.     Nose: Nose normal.     Mouth/Throat:     Mouth: Mucous membranes are dry.  Eyes:     Conjunctiva/sclera: Conjunctivae normal.  Cardiovascular:     Rate and Rhythm: Tachycardia present. Rhythm irregular.     Heart sounds: No murmur heard.   Pulmonary:     Effort: Pulmonary effort is normal. No respiratory distress.     Breath sounds: Normal breath sounds.  Abdominal:     Palpations: Abdomen is soft.     Tenderness: There is no abdominal tenderness.  Musculoskeletal:     Cervical back: Neck supple.  Skin:    General: Skin is warm and dry.     Capillary Refill: Capillary refill takes 2 to 3 seconds.  Neurological:     Mental Status: She is alert and oriented to person, place, and time.  Psychiatric:        Mood and Affect: Mood normal.      ____________________________________________   LABS (all labs ordered are listed, but only abnormal results are displayed)  Labs Reviewed  LACTIC ACID, PLASMA - Abnormal; Notable for the following components:      Result Value   Lactic Acid, Venous 2.0 (*)    All other components within normal limits  BRAIN NATRIURETIC PEPTIDE - Abnormal; Notable for the following components:   B Natriuretic Peptide 233.2 (*)    All other components within normal limits  FIBRIN DERIVATIVES D-DIMER ([facility] ONLY) - Abnormal; Notable for the following components:   Fibrin derivatives D-dimer ([facility]) 608.87 (*)    All other components within normal limits  TROPONIN I (HIGH SENSITIVITY) - Abnormal; Notable for the following components:   Troponin I (High Sensitivity) 25 (*)    All other components within normal limits  CULTURE, BLOOD (ROUTINE X 2)  CULTURE, BLOOD (ROUTINE X 2)  RESPIRATORY PANEL BY RT PCR (FLU A&B, COVID)  TSH  URINALYSIS, COMPLETE (UACMP) WITH MICROSCOPIC  LACTIC ACID, PLASMA  LACTIC ACID, PLASMA  PROCALCITONIN  PROTIME-INR  APTT  TROPONIN I (HIGH SENSITIVITY)    ____________________________________________  EKG  A. fib with rapid ventricular response with a rate of 163, normal axis, ST changes in aVR, V1, and in the inferior and lateral leads. ____________________________________________  RADIOLOGY  ED MD interpretation: No clear focal consolidation, effusion, edema, pneumothorax, or other clear acute intrathoracic abnormality.  Official radiology report(s): DG Chest 2 View  Result Date: 08/08/2020 CLINICAL DATA:  Tachycardia EXAM: CHEST - 2 VIEW COMPARISON:  August 05, 2020 FINDINGS: The cardiomediastinal silhouette is unchanged in contour.LEFT chest port with tip terminating over the SVC. No pleural effusion. No pneumothorax. No acute pleuroparenchymal abnormality. Visualized abdomen is unremarkable. Multilevel degenerative changes of the thoracic spine. IMPRESSION: No acute cardiopulmonary abnormality. Electronically Signed   By: Valentino Saxon MD   On: 08/08/2020 10:30    ____________________________________________   PROCEDURES  Procedure(s) performed (including Critical Care):  .Critical Care Performed by: Lucrezia Starch, MD Authorized by: Lucrezia Starch, MD   Critical care provider statement:    Critical care time (minutes):  45   Critical care time was exclusive of:  Separately billable procedures and treating other patients   Critical care was necessary to treat or prevent imminent or life-threatening deterioration of the following conditions:  Cardiac failure   Critical care was time spent personally by me on the following activities:  Discussions with consultants, evaluation of patient's response to treatment, examination of patient, ordering and performing treatments and interventions, ordering and review of laboratory studies, ordering and review of radiographic studies, pulse oximetry, re-evaluation of patient's condition, obtaining history from patient or surrogate and review of old charts .1-3 Lead EKG  Interpretation Performed by: Lucrezia Starch, MD Authorized by: Lucrezia Starch, MD     Interpretation: abnormal     ECG rate assessment: tachycardic     Rhythm: atrial fibrillation     Ectopy: none     Conduction: normal       ____________________________________________   INITIAL IMPRESSION / ASSESSMENT AND PLAN / ED COURSE        Patient presents with above-stated history exam for assessment of tachycardia borderline low blood pressures at the cancer center earlier today after patient received radiation for squamous cell carcinoma of the face and neck.  On arrival patient is tachycardic with a rate in the 160s with a BP of 105/80 and otherwise stable vital signs on room air.  Patient did have labs drawn prior to arrival to the ED with a CMP remarkable for K of 2.9, CL 93, and otherwise unremarkable.  CBC obtained shows WBC count of 18.3 and unremarkable hemoglobin with platelets of 425.  Magnesium is 2.1.  Differential includes but is not limited to A. fib secondary to dehydration, radiation therapy, ischemia, metabolic derangements, anemia, hypoxia, and other etiologies.  Patient was given IV fluids prior to arrival and she was started on diltiazem drip on arrival for rate control.  Patient's white blood cell count is elevated she has no findings on chest x-ray or fever or other findings on history or exam to suggest an acute infectious process.  Her troponin is mildly elevated 25 which I suspect demand ischemia and I suspect this will go down as her rate is controlled.  I will plan to obtain a second 2-hour troponin.  CMP noted above likely reflects a component of dehydration.  BNP is slightly elevated to 33 although patient does not appear volume overloaded on exam or chest x-ray.  While D-dimer is elevated at 608 this is within age-adjusted limits of 650 patient age  32 years old.    I did discuss patient's presentation work-up with on-call cardiologist Dr. Rockey Situ stated he would  see the patient.  I will plan to admit to medicine service for further evaluation management.        ____________________________________________   FINAL CLINICAL IMPRESSION(S) / ED DIAGNOSES  Final diagnoses:  Atrial fibrillation with RVR (HCC)  Hypokalemia  Dehydration  Troponin I above reference range  Cancer (HCC)    Medications  diltiazem (CARDIZEM) 1 mg/mL load via infusion 10 mg (10 mg Intravenous Bolus from Bag 08/08/20 1032)    And  diltiazem (CARDIZEM) 125 mg in dextrose 5% 125 mL (1 mg/mL) infusion (15 mg/hr Intravenous Rate/Dose Verify 08/08/20 1142)  potassium chloride 10 mEq in 100 mL IVPB (10 mEq Intravenous New Bag/Given 08/08/20 1137)  potassium chloride SA (KLOR-CON) CR tablet 40 mEq (has no administration in time range)  ondansetron (ZOFRAN) tablet 4 mg (has no administration in time range)  acetaminophen (TYLENOL) tablet 650 mg (has no administration in time range)  nicotine (NICODERM CQ - dosed in mg/24 hours) patch 21 mg (has no administration in time range)  albuterol (VENTOLIN HFA) 108 (90 Base) MCG/ACT inhaler 2 puff (has no administration in time range)  dextromethorphan-guaiFENesin (MUCINEX DM) 30-600 MG per 12 hr tablet 1 tablet (has no administration in time range)  metoprolol tartrate (LOPRESSOR) injection 5 mg (has no administration in time range)  metoprolol tartrate (LOPRESSOR) tablet 25 mg (has no administration in time range)  digoxin (LANOXIN) 0.25 MG/ML injection 0.25 mg (has no administration in time range)     ED Discharge Orders    None       Note:  This document was prepared using Dragon voice recognition software and may include unintentional dictation errors.   Lucrezia Starch, MD 08/08/20 [phone]

## 2020-08-08 NOTE — Progress Notes (Signed)
Patient brought upstairs via wheelchair from radiation department.  During her radiation treatment patient started feeling bad.  She is feeling weak with tachycardia and hypotensive.  Started feeling nausea and vomited while in exam room.  Documented vitals were obtained in radiation, unable to obtain vitals in med onc.

## 2020-08-08 NOTE — Progress Notes (Signed)
Per Wilhemena Durie, RN patient presented to radiation appt. with hypotension (82/61), tachycardia (rates of 160-203), and "not feeling well". Patient brought back to infusion by Benjamine Mola, RN at approx. 0910 to start 1L IVF bolus per Benjamine Mola, RN per Dr. Tasia Catchings. VS rechecked immediately upon arrival: BP 87/74, HR 172, R 20, O2 99% RA, Temp. 96.9. Patient states she had vomited multiple episodes from radiation to infusion. + dizziness/lightheadedness and weakness. Denies any chest pain, SOB. VS checked *SEE FLOWSHEETS* Dr. Tasia Catchings at chairside [phone]. 1L NS Bolus started at 0919. EKG performed at 0921. Beckey Rutter, NP at chairside [phone]. EKG presented to Dr. Tasia Catchings and Beckey Rutter, NP. Per Dr. Tasia Catchings, patient to go to ED. Patient transported to ED with IVF.

## 2020-08-08 NOTE — Progress Notes (Signed)
[place] for heparin infusion  Indication: atrial fibrillation  Allergies  Allergen Reactions  . Aspirin Swelling and Other (See Comments)  . Belladonna Alkaloids Rash  . Fluoxetine Hcl Other (See Comments) and Rash  . Naproxen Swelling, Rash and Other (See Comments)  . Phenobarbital Rash and Other (See Comments)  . Prozac [Fluoxetine Hcl] Rash   Patient Measurements: Height: 5\' 6"  (167.6 cm) Weight: 61.2 kg (135 lb) IBW/kg (Calculated) : 59.3  Vital Signs: Temp: 98.2 F (36.8 C) (10/07 1002) Temp Source: Oral (10/07 1002) BP: 105/80 (10/07 1002) Pulse Rate: 161 (10/07 1002)  Labs: Recent Labs    08/08/20 0821 08/08/20 0955 08/08/20 1338 08/08/20 1951  HGB 12.7  --   --   --   HCT 36.2  --   --   --   PLT 425*  --   --   --   APTT  --   --  34  --   LABPROT  --   --  13.8  --   INR  --   --  1.1  --   HEPARINUNFRC  --   --   --  <0.10*  CREATININE <0.30*  --   --   --   TROPONINIHS  --  25* 60*  --     CrCl cannot be calculated (This lab value cannot be used to calculate CrCl because it is not a number: <0.30).  Medical History: Past Medical History:  Diagnosis Date  . 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   Marland Kitchen Anxiety   . Arthritis   . Bipolar disorder ([place])   . COPD (chronic obstructive pulmonary disease) ([place])    NO INHALERS  . Depression   . Dysrhythmia    H/O V TACH  . Head and neck cancer ([place]) 07/29/2020  . Headache    H/O MIGRAINES  . Hypertension   . Seizures ([place])    X1 AFTER FALL   Assessment: 64 yo female with new onset A. Fib with RVR. Pharmacy consulted for heparin infusion dosing and monitoring.   Goal of Therapy:  Heparin level 0.3-0.7 units/ml Monitor platelets by anticoagulation protocol: Yes   Plan:  10/7 1951 HL < 0.10 subtherapeutic. Heparin 1500 unit bolus followed by increase in heparin drip to 1050 units/hr. Recheck HL 10/8 at 0400. CBC  daily.  Tawnya Crook, PharmD Clinical Pharmacist 08/08/2020 8:50 PM

## 2020-08-08 NOTE — ED Notes (Signed)
Pt d/c from Dilt drip, NP Randol Kern made aware and new EKG exported. Randol Kern, Np also notified of pt having 1 run of possible Vtach. This run captured on tele monitor and printed for NP to view.

## 2020-08-08 NOTE — ED Notes (Signed)
Called lab to add on drug screen.

## 2020-08-08 NOTE — ED Notes (Signed)
Updated pt's friend, [name] per pt requesting, stating she in the ED and that she will be able to have a visitor

## 2020-08-08 NOTE — ED Triage Notes (Addendum)
Pt presents to the ED from the [facility]. Pt was about to start her first cancer treatment for her head and neck cancer that she was dx with in September. Denies pain at this time. Per cancer center nurses, pt was hypotensive and started a liter bolus. On arrival, HR in the 160s and 527mL bolus already infused. Pt is A&Ox4 and NAD.

## 2020-08-08 NOTE — Progress Notes (Signed)
Call from RN. Patient was ordered to received KCl 60meq x 2 doses. Due to timing of other medications, this order expired before both doses could be given. I ordered a one time 35meq dose to complete the order.

## 2020-08-08 NOTE — ED Notes (Signed)
Pt transported to X-Ray at this time.  

## 2020-08-08 NOTE — ED Notes (Signed)
cardizem increased to 15 at this time per ed MD request

## 2020-08-09 ENCOUNTER — Other Ambulatory Visit: Payer: Self-pay

## 2020-08-09 ENCOUNTER — Ambulatory Visit: Payer: Medicaid Other

## 2020-08-09 ENCOUNTER — Telehealth: Payer: Self-pay | Admitting: Oncology

## 2020-08-09 ENCOUNTER — Observation Stay (HOSPITAL_COMMUNITY)
Admit: 2020-08-09 | Discharge: 2020-08-09 | Disposition: A | Discharge status: Home / Self Care | Payer: Medicare Other | Attending: Cardiovascular Disease | Admitting: Cardiovascular Disease

## 2020-08-09 ENCOUNTER — Ambulatory Visit: Payer: MEDICAID | Attending: Internal Medicine

## 2020-08-09 DIAGNOSIS — F17210 Nicotine dependence, cigarettes, uncomplicated: Secondary | ICD-10-CM | POA: Diagnosis present

## 2020-08-09 DIAGNOSIS — Z961 Presence of intraocular lens: Secondary | ICD-10-CM | POA: Diagnosis present

## 2020-08-09 DIAGNOSIS — R6510 Systemic inflammatory response syndrome (SIRS) of non-infectious origin without acute organ dysfunction: Secondary | ICD-10-CM | POA: Diagnosis present

## 2020-08-09 DIAGNOSIS — C310 Malignant neoplasm of maxillary sinus: Secondary | ICD-10-CM | POA: Diagnosis present

## 2020-08-09 DIAGNOSIS — Z23 Encounter for immunization: Secondary | ICD-10-CM

## 2020-08-09 DIAGNOSIS — Z888 Allergy status to other drugs, medicaments and biological substances status: Secondary | ICD-10-CM | POA: Diagnosis not present

## 2020-08-09 DIAGNOSIS — Z8249 Family history of ischemic heart disease and other diseases of the circulatory system: Secondary | ICD-10-CM | POA: Diagnosis not present

## 2020-08-09 DIAGNOSIS — X58XXXA Exposure to other specified factors, initial encounter: Secondary | ICD-10-CM | POA: Diagnosis present

## 2020-08-09 DIAGNOSIS — I4891 Unspecified atrial fibrillation: Secondary | ICD-10-CM

## 2020-08-09 DIAGNOSIS — Z90711 Acquired absence of uterus with remaining cervical stump: Secondary | ICD-10-CM | POA: Diagnosis not present

## 2020-08-09 DIAGNOSIS — I4819 Other persistent atrial fibrillation: Secondary | ICD-10-CM | POA: Diagnosis present

## 2020-08-09 DIAGNOSIS — Z9114 Patient's other noncompliance with medication regimen: Secondary | ICD-10-CM | POA: Diagnosis not present

## 2020-08-09 DIAGNOSIS — F32A Depression, unspecified: Secondary | ICD-10-CM

## 2020-08-09 DIAGNOSIS — I10 Essential (primary) hypertension: Secondary | ICD-10-CM

## 2020-08-09 DIAGNOSIS — E860 Dehydration: Secondary | ICD-10-CM | POA: Diagnosis present

## 2020-08-09 DIAGNOSIS — I081 Rheumatic disorders of both mitral and tricuspid valves: Secondary | ICD-10-CM | POA: Diagnosis present

## 2020-08-09 DIAGNOSIS — I361 Nonrheumatic tricuspid (valve) insufficiency: Secondary | ICD-10-CM

## 2020-08-09 DIAGNOSIS — Z9842 Cataract extraction status, left eye: Secondary | ICD-10-CM | POA: Diagnosis not present

## 2020-08-09 DIAGNOSIS — E876 Hypokalemia: Secondary | ICD-10-CM

## 2020-08-09 DIAGNOSIS — Z886 Allergy status to analgesic agent status: Secondary | ICD-10-CM | POA: Diagnosis not present

## 2020-08-09 DIAGNOSIS — I340 Nonrheumatic mitral (valve) insufficiency: Secondary | ICD-10-CM

## 2020-08-09 DIAGNOSIS — I671 Cerebral aneurysm, nonruptured: Secondary | ICD-10-CM | POA: Diagnosis present

## 2020-08-09 DIAGNOSIS — Z9049 Acquired absence of other specified parts of digestive tract: Secondary | ICD-10-CM | POA: Diagnosis not present

## 2020-08-09 DIAGNOSIS — T465X6A Underdosing of other antihypertensive drugs, initial encounter: Secondary | ICD-10-CM | POA: Diagnosis present

## 2020-08-09 DIAGNOSIS — Z20822 Contact with and (suspected) exposure to covid-19: Secondary | ICD-10-CM | POA: Diagnosis present

## 2020-08-09 DIAGNOSIS — J449 Chronic obstructive pulmonary disease, unspecified: Secondary | ICD-10-CM | POA: Diagnosis present

## 2020-08-09 DIAGNOSIS — I248 Other forms of acute ischemic heart disease: Secondary | ICD-10-CM | POA: Diagnosis present

## 2020-08-09 DIAGNOSIS — Z720 Tobacco use: Secondary | ICD-10-CM

## 2020-08-09 DIAGNOSIS — D72829 Elevated white blood cell count, unspecified: Secondary | ICD-10-CM | POA: Diagnosis present

## 2020-08-09 DIAGNOSIS — Z9841 Cataract extraction status, right eye: Secondary | ICD-10-CM | POA: Diagnosis not present

## 2020-08-09 DIAGNOSIS — Z8673 Personal history of transient ischemic attack (TIA), and cerebral infarction without residual deficits: Secondary | ICD-10-CM | POA: Diagnosis not present

## 2020-08-09 LAB — BASIC METABOLIC PANEL
Anion gap: 8 (ref 5–15)
BUN: 11 mg/dL (ref 8–23)
CO2: 27 mmol/L (ref 22–32)
Calcium: 8.6 mg/dL — ABNORMAL LOW (ref 8.9–10.3)
Chloride: 97 mmol/L — ABNORMAL LOW (ref 98–111)
Creatinine, Ser: 0.39 mg/dL — ABNORMAL LOW (ref 0.44–1.00)
GFR calc non Af Amer: >60 mL/min (ref >60)
Glucose, Bld: 107 mg/dL — ABNORMAL HIGH (ref 70–99)
Potassium: 4 mmol/L (ref 3.5–5.1)
Sodium: 132 mmol/L — ABNORMAL LOW (ref 135–145)

## 2020-08-09 LAB — HIV ANTIBODY (ROUTINE TESTING W REFLEX): HIV Screen 4th Generation wRfx: NONREACTIVE

## 2020-08-09 LAB — LIPID PANEL
Cholesterol: 129 mg/dL (ref 0–200)
HDL: 52 mg/dL (ref >40)
LDL Cholesterol: 60 mg/dL (ref 0–99)
Total CHOL/HDL Ratio: 2.5 RATIO
Triglycerides: 87 mg/dL (ref <150)
VLDL: 17 mg/dL (ref 0–40)

## 2020-08-09 LAB — ECHOCARDIOGRAM COMPLETE
AR max vel: 2.7 cm2
AV Area VTI: 3.73 cm2
AV Area mean vel: 3.01 cm2
AV Mean grad: 3.5 mmHg
AV Peak grad: 6.4 mmHg
Ao pk vel: 1.27 m/s
Area-P 1/2: 3.63 cm2
Height: 66 in
S' Lateral: 2.71 cm
Weight: 2160 oz

## 2020-08-09 LAB — CBC
HCT: 31.5 % — ABNORMAL LOW (ref 36.0–46.0)
Hemoglobin: 10.8 g/dL — ABNORMAL LOW (ref 12.0–15.0)
MCH: 34 pg (ref 26.0–34.0)
MCHC: 34.3 g/dL (ref 30.0–36.0)
MCV: 99.1 fL (ref 80.0–100.0)
Platelets: 362 10*3/uL (ref 150–400)
RBC: 3.18 MIL/uL — ABNORMAL LOW (ref 3.87–5.11)
RDW: 13.5 % (ref 11.5–15.5)
WBC: 19.2 10*3/uL — ABNORMAL HIGH (ref 4.0–10.5)
nRBC: 0 % (ref 0.0–0.2)

## 2020-08-09 LAB — HEMOGLOBIN A1C
Hgb A1c MFr Bld: 5.6 % (ref 4.8–5.6)
Mean Plasma Glucose: 114.02 mg/dL

## 2020-08-09 LAB — TROPONIN I (HIGH SENSITIVITY): Troponin I (High Sensitivity): 79 ng/L — ABNORMAL HIGH (ref <18)

## 2020-08-09 LAB — HEPARIN LEVEL (UNFRACTIONATED)
Heparin Unfractionated: 0.19 IU/mL — ABNORMAL LOW (ref 0.30–0.70)
Heparin Unfractionated: 0.26 IU/mL — ABNORMAL LOW (ref 0.30–0.70)

## 2020-08-09 MED ORDER — AMLODIPINE BESYLATE 10 MG PO TABS
10.0000 mg | ORAL_TABLET | Freq: Every day | ORAL | Status: DC
  Administered 2020-08-09 – 2020-08-10 (×2): 10 mg via ORAL
  Filled 2020-08-09 (×2): qty 1

## 2020-08-09 MED ORDER — CHLORHEXIDINE GLUCONATE CLOTH 2 % EX PADS
6.0000 | MEDICATED_PAD | Freq: Every day | CUTANEOUS | Status: DC
  Administered 2020-08-10: 6 via TOPICAL

## 2020-08-09 MED ORDER — INFLUENZA VAC A&B SA ADJ QUAD 0.5 ML IM PRSY
0.5000 mL | PREFILLED_SYRINGE | INTRAMUSCULAR | Status: DC | PRN
  Filled 2020-08-09: qty 0.5

## 2020-08-09 MED ORDER — ENSURE ENLIVE PO LIQD
237.0000 mL | Freq: Three times a day (TID) | ORAL | Status: DC
  Administered 2020-08-09 – 2020-08-10 (×2): 237 mL via ORAL

## 2020-08-09 MED ORDER — METOPROLOL TARTRATE 50 MG PO TABS
50.0000 mg | ORAL_TABLET | Freq: Two times a day (BID) | ORAL | Status: DC
  Administered 2020-08-09 – 2020-08-10 (×2): 50 mg via ORAL
  Filled 2020-08-09 (×2): qty 1

## 2020-08-09 MED ORDER — LISINOPRIL 20 MG PO TABS
40.0000 mg | ORAL_TABLET | Freq: Every day | ORAL | Status: DC
  Administered 2020-08-09 – 2020-08-10 (×2): 40 mg via ORAL
  Filled 2020-08-09 (×2): qty 2

## 2020-08-09 MED ORDER — APIXABAN 5 MG PO TABS
5.0000 mg | ORAL_TABLET | Freq: Two times a day (BID) | ORAL | Status: DC
  Administered 2020-08-09 – 2020-08-10 (×3): 5 mg via ORAL
  Filled 2020-08-09 (×3): qty 1

## 2020-08-09 MED ORDER — CHLORHEXIDINE GLUCONATE 0.12 % MT SOLN
15.0000 mL | Freq: Two times a day (BID) | OROMUCOSAL | Status: DC
  Administered 2020-08-09 – 2020-08-10 (×3): 15 mL via OROMUCOSAL
  Filled 2020-08-09 (×3): qty 15

## 2020-08-09 MED ORDER — ORAL CARE MOUTH RINSE: 15.0000 mL | Freq: Two times a day (BID) | OROMUCOSAL | Status: DC

## 2020-08-09 MED ORDER — METOPROLOL TARTRATE 25 MG PO TABS
25.0000 mg | ORAL_TABLET | Freq: Once | ORAL | Status: AC
  Administered 2020-08-09: 25 mg via ORAL
  Filled 2020-08-09: qty 1

## 2020-08-09 MED ORDER — HEPARIN BOLUS VIA INFUSION
1800.0000 [IU] | Freq: Once | INTRAVENOUS | Status: AC
  Administered 2020-08-09: 1800 [IU] via INTRAVENOUS
  Filled 2020-08-09: qty 1800

## 2020-08-09 MED ORDER — APIXABAN 5 MG PO TABS: 5.0000 mg | ORAL_TABLET | Freq: Two times a day (BID) | ORAL | 2 refills | Status: DC

## 2020-08-09 NOTE — Telephone Encounter (Signed)
I called Shannon Schroeder to give her appts for next visit. Patient is in ER but left VM on here cell phone. I also called her friend Shannon Schroeder. The friend was oi her way to hospital and will tell patient about her appts on Monday 10/11 after radiation appt.

## 2020-08-09 NOTE — ED Notes (Signed)
Pt up to restroom.

## 2020-08-09 NOTE — Progress Notes (Signed)
*  PRELIMINARY RESULTS* Echocardiogram 2D Echocardiogram has been performed.  Shannon Schroeder 08/09/2020, 12:00 PM

## 2020-08-09 NOTE — Progress Notes (Signed)
Progress Note  Patient Name: Shannon Schroeder Date of Encounter: 08/09/2020  Primary Cardiologist: Ida Rogue, MD  Subjective   Converted to sinus rhythm.  No chest pain or dyspnea. Ongoing facial discomfort.  Inpatient Medications    Scheduled Meds:  apixaban  5 mg Oral BID   B-complex with vitamin C  1 tablet Oral Daily   chlorhexidine  15 mL Mouth Rinse BID   Chlorhexidine Gluconate Cloth  6 each Topical Daily   fentaNYL  1 patch Transdermal Q72H   gabapentin  300 mg Oral TID   lisinopril  40 mg Oral Daily   mouth rinse  15 mL Mouth Rinse q12n4p   metoprolol tartrate  25 mg Oral Once   metoprolol tartrate  50 mg Oral BID   multivitamin with minerals  1 tablet Oral Daily   nicotine  21 mg Transdermal Daily   Continuous Infusions:  PRN Meds: acetaminophen, albuterol, dextromethorphan-guaiFENesin, influenza vaccine adjuvanted, lidocaine-prilocaine, melatonin, metoprolol tartrate, ondansetron, oxyCODONE-acetaminophen   Vital Signs    Vitals:   08/09/20 0715 08/09/20 0730 08/09/20 0914 08/09/20 0948  BP:   (!) 190/88 (!) 184/93  Pulse: (!) 58 60 94 73  Resp: 14 16  16   Temp:    98.7 F (37.1 C)  TempSrc:    Oral  SpO2: 95% 95%  98%  Weight:      Height:        Intake/Output Summary (Last 24 hours) at 08/09/2020 1109 Last data filed at 08/08/2020 1937 Gross per 24 hour  Intake 607.61 ml  Output --  Net 607.61 ml   Filed Weights   08/08/20 1003  Weight: 61.2 kg    Physical Exam   GEN: Well nourished, well developed, in no acute distress.  HEENT: R facial swelling. Neck: Supple, no JVD, carotid bruits, or masses. Cardiac: RRR, no murmurs, rubs, or gallops. No clubbing, cyanosis, edema.  Radials/DP/PT 2+ and equal bilaterally.  Respiratory:  Respirations regular and unlabored, clear to auscultation bilaterally. GI: Soft, nontender, nondistended, BS + x 4. MS: no deformity or atrophy. Skin: warm and dry, no rash. Neuro:  Strength and  sensation are intact. Psych: AAOx3.  Flat affect.  Labs    Chemistry Recent Labs  Lab 08/08/20 0821 08/09/20 0421  NA 133* 132*  K 2.9* 4.0  CL 93* 97*  CO2 29 27  GLUCOSE 117* 107*  BUN 10 11  CREATININE <0.30* 0.39*  CALCIUM 8.7* 8.6*  PROT 7.1  --   ALBUMIN 3.4*  --   AST 12*  --   ALT 11  --   ALKPHOS 101  --   BILITOT 0.8  --   GFRNONAA NOT CALCULATED >60  ANIONGAP 11 8     Hematology Recent Labs  Lab 08/08/20 0821 08/09/20 0421  WBC 18.3* 19.2*  RBC 3.75* 3.18*  HGB 12.7 10.8*  HCT 36.2 31.5*  MCV 96.5 99.1  MCH 33.9 34.0  MCHC 35.1 34.3  RDW 13.2 13.5  PLT 425* 362    Cardiac Enzymes  Recent Labs  Lab 08/08/20 0955 08/08/20 1338 08/08/20 1932 08/08/20 2222 08/08/20 2359  TROPONINIHS 25* 60* 103* 91* 79*      BNP Recent Labs  Lab 08/08/20 0955  BNP 233.2*    Lipids  Lab Results  Component Value Date   CHOL 129 08/09/2020   HDL 52 08/09/2020   LDLCALC 60 08/09/2020   TRIG 87 08/09/2020   CHOLHDL 2.5 08/09/2020    HbA1c  Lab Results  Component Value Date   HGBA1C 5.6 08/09/2020    Radiology    DG Chest 2 View  Result Date: 08/08/2020 CLINICAL DATA:  Tachycardia EXAM: CHEST - 2 VIEW COMPARISON:  August 05, 2020 FINDINGS: The cardiomediastinal silhouette is unchanged in contour.LEFT chest port with tip terminating over the SVC. No pleural effusion. No pneumothorax. No acute pleuroparenchymal abnormality. Visualized abdomen is unremarkable. Multilevel degenerative changes of the thoracic spine. IMPRESSION: No acute cardiopulmonary abnormality. Electronically Signed   By: Valentino Saxon MD   On: 08/08/2020 10:30   CT ANGIO CHEST PE W OR WO CONTRAST  Result Date: 08/08/2020 CLINICAL DATA:  History of head and neck cancer with hypotension and shortness of breath EXAM: CT ANGIOGRAPHY CHEST WITH CONTRAST TECHNIQUE: Multidetector CT imaging of the chest was performed using the standard protocol during bolus administration of  intravenous contrast. Multiplanar CT image reconstructions and MIPs were obtained to evaluate the vascular anatomy. CONTRAST:  44mL OMNIPAQUE IOHEXOL 350 MG/ML SOLN COMPARISON:  Chest x-ray from earlier in the same day. FINDINGS: Cardiovascular: Thoracic aorta demonstrates atherosclerotic calcifications without aneurysmal dilatation or dissection. No cardiac enlargement is seen. No significant coronary calcifications are noted. The pulmonary artery shows a normal branching pattern. No focal filling defect to suggest pulmonary embolism is seen. Mediastinum/Nodes: Thoracic inlet is within normal limits. Left chest wall port is seen. Needle is noted within the port. No significant hilar or mediastinal adenopathy is noted. The esophagus as visualized is within normal limits. Lungs/Pleura: The lungs are well aerated. No focal infiltrate or sizable effusion is seen. No pneumothorax or sizable parenchymal nodules are noted. Upper Abdomen: Visualized upper abdomen shows no acute abnormality. Musculoskeletal: Degenerative changes of the thoracic spine are noted. No acute rib abnormality is seen. Review of the MIP images confirms the above findings. IMPRESSION: No evidence of pulmonary emboli. No other focal abnormality is seen. Aortic Atherosclerosis (ICD10-I70.0). Electronically Signed   By: Inez Catalina M.D.   On: 08/08/2020 17:59   US Venous Img Lower Bilateral (DVT)  Result Date: 08/08/2020 CLINICAL DATA:  Positive D-dimer. EXAM: BILATERAL LOWER EXTREMITY VENOUS DOPPLER ULTRASOUND TECHNIQUE: Gray-scale sonography with compression, as well as color and duplex ultrasound, were performed to evaluate the deep venous system(s) from the level of the common femoral vein through the popliteal and proximal calf veins. COMPARISON:  None. FINDINGS: VENOUS Normal compressibility of the common femoral, superficial femoral, and popliteal veins, as well as the visualized calf veins. Visualized portions of profunda femoral vein and  great saphenous vein unremarkable. No filling defects to suggest DVT on grayscale or color Doppler imaging. Doppler waveforms show normal direction of venous flow, normal respiratory plasticity and response to augmentation. OTHER None. Limitations: none IMPRESSION: Negative. Electronically Signed   By: Constance Holster M.D.   On: 08/08/2020 18:27   DG Chest Port 1 View  Result Date: 08/05/2020 CLINICAL DATA:  Status post Port-A-Cath placement EXAM: PORTABLE CHEST 1 VIEW COMPARISON:  None. FINDINGS: Left-sided Port-A-Cath with the tip projecting over the SVC. No focal consolidation. No pleural effusion or pneumothorax. Heart and mediastinal contours are unremarkable. No acute osseous abnormality. IMPRESSION: Left-sided Port-A-Cath with the tip projecting over the SVC. Electronically Signed   By: Kathreen Devoid   On: 08/05/2020 11:52    Telemetry    Sinus rhythm/sinus brady - Personally Reviewed  Cardiac Studies   2D Echocardiogram pending  Patient Profile      64 y.o. female with a hx of COPD, bipolar disorder, HTN, invasive squamous cell carcinoma/maxillary  sinus cancer, recently started on radiation treatment, being set up for chemotherapy, who presented to oncology 10/7 for f/u and was found to be in AFib w/ RVR.  Assessment & Plan    1.  Persistent AFib:  Unclear onset as she began noting fatigue ~ 5 days prior to admission.  Noted to be tachycardic @ oncology visit 10/7 and referred to the ED where Afib RVR documented.  She converted to sinus on IV dilt and is currently on oral metoprolol only w/ rates in the 50's to 60's. Will consolidate  blocker to 50 BID today.  D/c heparin in favor of eliquis 5 bid (CHA2DS2VASc = 3).  I have filled out assistance program paperwork and have given to her along w/ Rx to be mailed w/ assistance form once she completes.  Echo pending.  2.  HsTroponin elevation/Demand ischemia:  Mild HsTrop bump to peak to peak of 103, now down-trending.  No chest pain or  dyspnea.  Echo pending.  Provided EF nl, would cont  blocker @ d/c.  No ASA 2/2 new eliquis.  With dynamic ECG changes when in Afib and mild HsT elevation, we will consider outpt stress testing.  It is notable that no significant coronary Ca2+ was noted on CTA chest.  3.  Essential HTN:  Poorly controlled in the setting of noncompliance @ home. Consolidating  blocker to 50 bid.  Will add back and titrate home dose of lisinopril to 40mg  daily. Discussed importance of compliance w/ meds in preventing BP spikes and potentially recurrent afib.  Suspect she will require a third agent.  If so, can consider amlodipine as outpt.  4.  Squamous cell carcinoma: followed closely by oncology. Recently started radiation, pending chemo.    5.  Hypokalemia:  K nl this AM.  6.  Anemia: H/H down slightly this AM. Will need f/u in next 1-2 wks given new eliquis start.  7.  Leukocytosis:  WBC elevated dating back to at least 9/8.  19.2 this AM. Afebrile. CXR/CTA chest w/o acute cardiopulmonary findings. UA hazy but otw appears nl.  Defer to IM.  Signed, Murray Hodgkins, NP  08/09/2020, 11:09 AM    For questions or updates, please contact   Please consult [email or web address] for contact info under Cardiology/STEMI.

## 2020-08-09 NOTE — Progress Notes (Signed)
Per dr. Sharolyn Douglas okay to give schedule metoprolol 25 mg even with heart rate in high 50-'s low 60's. Will continue to monitor

## 2020-08-09 NOTE — Progress Notes (Signed)
Initial Nutrition Assessment  DOCUMENTATION CODES:   Not applicable  INTERVENTION:  Ensure Enlive po TID, each supplement provides 350 kcal and 20 grams of protein  Magic cup BID with meals, each supplement provides 290 kcal and 9 grams of protein  CIB daily on breakfast tray, each supplement with 237 ml whole milk provides 280 kcal and 13 grams of protein  Education provided  "Taste and Smell Changes" handout attached to d/c instructions  NUTRITION DIAGNOSIS:   Inadequate oral intake related to cancer and cancer related treatments (head/neck cancer currently undergoing radiation therapy) as evidenced by  (altered taste of food per pt report).    GOAL:   Patient will meet greater than or equal to 90% of their needs    MONITOR:   PO intake, Labs, Supplement acceptance, Weight trends, Skin, I & O's  REASON FOR ASSESSMENT:   Malnutrition Screening Tool    ASSESSMENT:  RD working remotely.  64 year old female with history of head/neck and maxillary sinus cancer, HTN, COPD, TIA, tobacco abuse, aneurysm of anterior cerebral artery, bipolar, and depression who presented to radiation treatment yesterday when she subsequently started feeling weak and found to have tachycardia and hypotension. Pt admitted for atrial fibrillation with RVR.  Able to speak with pt via phone this afternoon. Pt reports trying to rest at this time, limited nutrition history obtained. She states that her appetite is lousy and all food taste nasty secondary to radiation treatments. Pt is drinking high calorie boost, reports trying to drink 3 daily and likes the chocolate flavor. Will order Ensure TID, Magic Cup, as well as CIB on breakfast tray to aid with meeting needs. RD discussed strategies to aid with altered taste (adding sugar or lemon juice/vinegar to enhance flavors, seasoning foods with herbs/spices, using a mouth rinse before meal times) and encouraged increasing supplement intake. "Taste and  Smell Changes" handout from [employer] has been attatched to discharge instructions for patient.   Per chart, weights have been stable over the past month, noted 31 lb (18.7%) wt loss over the last 13 months; insignificant however concerning given advanced age as well as head and neck cancer.   Mild pitting LLE, mild right sided facial edema noted  Medications reviewed and include: B-complex with C, Fentanyl patch, Gabapentin, MVI  Labs: Na 132 (L), WBC 19.2 (H), Hgb 10.8 (L), HCT 31.5 (L)   NUTRITION - FOCUSED PHYSICAL EXAM: Unable to complete at this time, RD working remotely.   Diet Order:   Diet Order            Diet Heart Room service appropriate? Yes; Fluid consistency: Thin  Diet effective now                 EDUCATION NEEDS:   Education needs have been addressed  Skin:  Skin Assessment: Skin Integrity Issues: Skin Integrity Issues:: Incisions Incisions: closed; left chest (10/4)  Last BM:  pta  Height:   Ht Readings from Last 1 Encounters:  08/08/20 5\' 6"  (1.676 m)    Weight:   Wt Readings from Last 1 Encounters:  08/08/20 61.2 kg    BMI:  Body mass index is 21.79 kg/m.  Estimated Nutritional Needs:   Kcal:  1700-1900  Protein:  85-95  Fluid:  >/= 1.7 L/day   Lajuan Lines, RD, LDN Clinical Nutrition After Hours/Weekend Pager # in [email or web address]

## 2020-08-09 NOTE — Consult Note (Addendum)
Hematology/Oncology Consult note [facility] Telephone:([phone] [phone]  Patient Care Team: Pcp, No as PCP - General Rockey Situ Kathlene November, MD as PCP - Cardiology (Cardiology) Earlie Server, MD as Consulting Physician (Oncology)   Name of the patient: Shannon Schroeder  [id number]  Sep 06, 1956   Date of visit: 08/09/20 REASON FOR COSULTATION:  Maxillary sinus cancer, new onset of atrial fibrillation History of presenting illness-  64 y.o. female with PMH listed at below who was sent to emergency room by me yesterday due to hypotension and tachycardia and abnormal EKG. Patient was here yesterday for treatment of maxillary cancer.  She had 1 treatment of radiation.  Patient was found to have hypotension, tachycardia, abnormal EKG, hypokalemia nausea vomiting prior to chemotherapy and was sent to emergency room for further evaluation. In the emergency room, repeat EKG showed atrial fibrillation with rapid ventricular response.   Patient was treated with Cardizem drip and heparin drip, seen by cardiology.  Started on beta-blocker.  Rate has been controlled and converted spontaneously to sinus rhythm this morning.  Oncology was consulted for further evaluation and recommendation. Patient feels better compared to yesterday.  Denies any chest pain, lightheadedness.  Ongoing facial discomfort and pain.  She rates the pain is 7 out of 10 today.  Denies any nausea vomiting  Review of Systems  Constitutional: Positive for fatigue. Negative for appetite change, chills and fever.  HENT:   Negative for hearing loss and voice change.        Facial pain  Eyes: Negative for eye problems.  Respiratory: Negative for chest tightness and cough.   Cardiovascular: Negative for chest pain.  Gastrointestinal: Negative for abdominal distention, abdominal pain and blood in stool.  Endocrine: Negative for hot flashes.  Genitourinary: Negative for difficulty urinating and frequency.     Musculoskeletal: Negative for arthralgias.  Skin: Negative for itching and rash.  Neurological: Negative for extremity weakness.  Hematological: Negative for adenopathy.  Psychiatric/Behavioral: Negative for confusion.    Allergies  Allergen Reactions  . Aspirin Swelling and Other (See Comments)  . Belladonna Alkaloids Rash  . Fluoxetine Hcl Other (See Comments) and Rash  . Naproxen Swelling, Rash and Other (See Comments)  . Phenobarbital Rash and Other (See Comments)  . Prozac [Fluoxetine Hcl] Rash    Patient Active Problem List   Diagnosis Date Noted  . Encounter for antineoplastic chemotherapy 08/08/2020  . Atrial fibrillation with RVR ([place]) 08/08/2020  . Elevated troponin 08/08/2020  . Leukocytosis 08/08/2020  . Hypokalemia 08/08/2020  . SIRS (systemic inflammatory response syndrome) ([place]) 08/08/2020  . Goals of care, counseling/discussion 08/06/2020  . Head and neck cancer ([place]) 07/29/2020  . 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 Weight loss 07/29/2020  . Neoplasm related pain 07/29/2020  . Abscess of upper gum 09/12/2019  . History of ventricular tachycardia 03/22/2019  . Bipolar affective disorder, current episode mixed ([place]) 06/29/2018  . TIA (transient ischemic attack) 06/15/2018  . Tobacco abuse 06/02/2018  . COPD (chronic obstructive pulmonary disease) ([place]) 04/21/2018  . URI (upper respiratory infection) 03/31/2018  . Hypertension 03/31/2018  . Tachycardia 02/02/2018  . Rash of body 02/02/2018  . Hyponatremia 02/02/2018     Past Medical History:  Diagnosis Date  . 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   Marland Kitchen Anxiety   . Arthritis   . Bipolar disorder ([place])   . COPD (chronic obstructive pulmonary disease) ([place])    NO INHALERS  .  Depression   . Dysrhythmia    H/O V TACH  . Head and neck cancer ([place]) 07/29/2020  . Headache    H/O MIGRAINES  . Hypertension   . Seizures ([place])    X1 AFTER FALL      Past Surgical History:  Procedure Laterality Date  . ABDOMINAL HYSTERECTOMY     partial  . BREAST SURGERY     biopsy  . CARDIAC CATHETERIZATION     X 2  . CARPAL TUNNEL RELEASE Right   . CARPECTOMY HAND Right   . CATARACT EXTRACTION W/PHACO Left 03/08/2018   Procedure: CATARACT EXTRACTION PHACO AND INTRAOCULAR LENS PLACEMENT (IOC);  Surgeon: Birder Robson, MD;  Location: [facility] ORS;  Service: Ophthalmology;  Laterality: Left;  [place] 00:55 AP% 13.5 CDE 7.52 Fluid pack lot # [id number]  . CATARACT EXTRACTION W/PHACO Right 04/05/2018   Procedure: CATARACT EXTRACTION PHACO AND INTRAOCULAR LENS PLACEMENT (IOC);  Surgeon: Birder Robson, MD;  Location: [facility] ORS;  Service: Ophthalmology;  Laterality: Right;  [place] 00:36 AP% 15.9 CDE 5.72 Fluid pack lot # [id number]  . CEREBRAL ANEURYSM REPAIR    . CHOLECYSTECTOMY    . COLONOSCOPY WITH PROPOFOL N/A 04/26/2019   Procedure: COLONOSCOPY WITH PROPOFOL;  Surgeon: Jonathon Bellows, MD;  Location: [facility] ENDOSCOPY;  Service: Gastroenterology;  Laterality: N/A;  . PORTACATH PLACEMENT Left 08/05/2020   Procedure: INSERTION PORT-A-CATH;  Surgeon: Herbert Pun, MD;  Location: [facility] ORS;  Service: General;  Laterality: Left;  . TUBAL LIGATION      Social History   Socioeconomic History  . Marital status: Divorced    Spouse name: Not on file  . Number of children: Not on file  . Years of education: 21  . Highest education level: Bachelor's degree (e.g., BA, AB, BS)  Occupational History  . Occupation: farmer  Tobacco Use  . Smoking status: Current Every Day Smoker    Packs/day: 1.00    Years: 50.00    Pack years: 50.00    Types: Cigarettes  . Smokeless tobacco: Never Used  Vaping Use  . Vaping Use: Never used  Substance and Sexual Activity  . Alcohol use: No  . Drug use: No  . Sexual activity: Not Currently  Other Topics Concern  . Not on file  Social History Narrative   Pt lives at [facility].    Social Determinants of  Health   Financial Resource Strain:   . Difficulty of Paying Living Expenses: Not on file  Food Insecurity:   . Worried About Charity fundraiser in the Last Year: Not on file  . Ran Out of Food in the Last Year: Not on file  Transportation Needs:   . Lack of Transportation (Medical): Not on file  . Lack of Transportation (Non-Medical): Not on file  Physical Activity:   . Days of Exercise per Week: Not on file  . Minutes of Exercise per Session: Not on file  Stress:   . Feeling of Stress : Not on file  Social Connections:   . Frequency of Communication with Friends and Family: Not on file  . Frequency of Social Gatherings with Friends and Family: Not on file  . Attends Religious Services: Not on file  . Active Member of Clubs or Organizations: Not on file  . Attends Archivist Meetings: Not on file  . Marital Status: Not on file  Intimate Partner Violence:   . Fear of Current or Ex-Partner: Not on file  . Emotionally Abused: Not on file  .  Physically Abused: Not on file  . Sexually Abused: Not on file     Family History  Problem Relation Age of Onset  . Hypertension Mother   . Heart failure Mother   . Hypertension Brother   . Stroke Brother   . Hypertension Son   . Emphysema Maternal Aunt   . Hypertension Paternal Aunt   . Hypertension Paternal Uncle   . Hypertension Maternal Grandmother   . Hypertension Paternal Grandmother      Current Facility-Administered Medications:  .  acetaminophen (TYLENOL) tablet 650 mg, 650 mg, Oral, Q6H PRN, Ivor Costa, MD .  albuterol (VENTOLIN HFA) 108 (90 Base) MCG/ACT inhaler 2 puff, 2 puff, Inhalation, Q4H PRN, Ivor Costa, MD .  apixaban (ELIQUIS) tablet 5 mg, 5 mg, Oral, BID, Theora Gianotti, NP, 5 mg at 08/09/20 1120 .  B-complex with vitamin C tablet 1 tablet, 1 tablet, Oral, Daily, Ivor Costa, MD, 1 tablet at 08/09/20 1120 .  chlorhexidine (PERIDEX) 0.12 % solution 15 mL, 15 mL, Mouth Rinse, BID, Fritzi Mandes,  MD, 15 mL at 08/09/20 1001 .  Chlorhexidine Gluconate Cloth 2 % PADS 6 each, 6 each, Topical, Daily, Fritzi Mandes, MD .  dextromethorphan-guaiFENesin ([place] DM) 30-600 MG per 12 hr tablet 1 tablet, 1 tablet, Oral, BID PRN, Ivor Costa, MD .  fentaNYL (DURAGESIC) 25 MCG/HR 1 patch, 1 patch, Transdermal, Q72H, Ivor Costa, MD .  gabapentin (NEURONTIN) capsule 300 mg, 300 mg, Oral, TID, Ivor Costa, MD, 300 mg at 08/09/20 1001 .  influenza vaccine adjuvanted (FLUAD) injection 0.5 mL, 0.5 mL, Intramuscular, Prior to discharge, Fritzi Mandes, MD .  lidocaine-prilocaine (EMLA) cream 1 application, 1 application, Topical, PRN, Ivor Costa, MD .  lisinopril (ZESTRIL) tablet 40 mg, 40 mg, Oral, Daily, Theora Gianotti, NP, 40 mg at 08/09/20 1127 .  MEDLINE mouth rinse, 15 mL, Mouth Rinse, q12n4p, Fritzi Mandes, MD .  melatonin tablet 10 mg, 10 mg, Oral, QHS PRN, Ivor Costa, MD .  metoprolol tartrate (LOPRESSOR) injection 5 mg, 5 mg, Intravenous, Q6H PRN, Rockey Situ, Kathlene November, MD .  metoprolol tartrate (LOPRESSOR) tablet 50 mg, 50 mg, Oral, BID, Theora Gianotti, NP .  multivitamin with minerals tablet 1 tablet, 1 tablet, Oral, Daily, Ivor Costa, MD, 1 tablet at 08/09/20 1001 .  nicotine (NICODERM CQ - dosed in mg/24 hours) patch 21 mg, 21 mg, Transdermal, Daily, Ivor Costa, MD, 21 mg at 08/09/20 1001 .  ondansetron (ZOFRAN) tablet 4 mg, 4 mg, Oral, Q6H PRN, Ivor Costa, MD .  oxyCODONE-acetaminophen (PERCOCET/ROXICET) 5-325 MG per tablet 1 tablet, 1 tablet, Oral, Q4H PRN, Ivor Costa, MD, 1 tablet at 08/09/20 1001   Physical exam:  Vitals:   08/09/20 0730 08/09/20 0914 08/09/20 0948 08/09/20 1121  BP:  (!) 190/88 (!) 184/93 (!) 169/78  Pulse: 60 94 73 (!) 56  Resp: 16  16   Temp:   98.7 F (37.1 C)   TempSrc:   Oral   SpO2: 95%  98%   Weight:      Height:       Physical Exam Constitutional:      General: She is not in acute distress.    Appearance: She is not diaphoretic.  HENT:      Head: Normocephalic and atraumatic.     Nose: Nose normal.     Mouth/Throat:     Pharynx: No oropharyngeal exudate.  Eyes:     General: No scleral icterus.    Pupils: Pupils are equal, round, and  reactive to light.  Cardiovascular:     Rate and Rhythm: Normal rate and regular rhythm.     Heart sounds: No murmur heard.   Pulmonary:     Effort: Pulmonary effort is normal. No respiratory distress.     Breath sounds: No rales.  Chest:     Chest wall: No tenderness.  Abdominal:     General: There is no distension.     Palpations: Abdomen is soft.     Tenderness: There is no abdominal tenderness.  Musculoskeletal:        General: Normal range of motion.     Cervical back: Normal range of motion and neck supple.     Comments: Right facial swelling  Skin:    General: Skin is warm and dry.     Findings: No erythema.  Neurological:     Mental Status: She is alert and oriented to person, place, and time.     Cranial Nerves: No cranial nerve deficit.     Motor: No abnormal muscle tone.     Coordination: Coordination normal.  Psychiatric:        Mood and Affect: Affect normal.         CMP Latest Ref Rng & Units 08/09/2020  Glucose 70 - 99 mg/dL 107(H)  BUN 8 - 23 mg/dL 11  Creatinine 0.44 - 1.00 mg/dL 0.39(L)  Sodium 135 - 145 mmol/L 132(L)  Potassium 3.5 - 5.1 mmol/L 4.0  Chloride 98 - 111 mmol/L 97(L)  CO2 22 - 32 mmol/L 27  Calcium 8.9 - 10.3 mg/dL 8.6(L)  Total Protein 6.5 - 8.1 g/dL -  Total Bilirubin 0.3 - 1.2 mg/dL -  Alkaline Phos 38 - 126 U/L -  AST 15 - 41 U/L -  ALT 0 - 44 U/L -   CBC Latest Ref Rng & Units 08/09/2020  WBC 4.0 - 10.5 K/uL 19.2(H)  Hemoglobin 12.0 - 15.0 g/dL 10.8(L)  Hematocrit 36 - 46 % 31.5(L)  Platelets 150 - 400 K/uL 362    RADIOGRAPHIC STUDIES: I have personally reviewed the radiological images as listed and agreed with the findings in the report. DG Chest 2 View  Result Date: 08/08/2020 CLINICAL DATA:  Tachycardia EXAM: CHEST - 2  VIEW COMPARISON:  August 05, 2020 FINDINGS: The cardiomediastinal silhouette is unchanged in contour.LEFT chest port with tip terminating over the SVC. No pleural effusion. No pneumothorax. No acute pleuroparenchymal abnormality. Visualized abdomen is unremarkable. Multilevel degenerative changes of the thoracic spine. IMPRESSION: No acute cardiopulmonary abnormality. Electronically Signed   By: Valentino Saxon MD   On: 08/08/2020 10:30   CT ANGIO CHEST PE W OR WO CONTRAST  Result Date: 08/08/2020 CLINICAL DATA:  History of head and neck cancer with hypotension and shortness of breath EXAM: CT ANGIOGRAPHY CHEST WITH CONTRAST TECHNIQUE: Multidetector CT imaging of the chest was performed using the standard protocol during bolus administration of intravenous contrast. Multiplanar CT image reconstructions and MIPs were obtained to evaluate the vascular anatomy. CONTRAST:  20mL OMNIPAQUE IOHEXOL 350 MG/ML SOLN COMPARISON:  Chest x-ray from earlier in the same day. FINDINGS: Cardiovascular: Thoracic aorta demonstrates atherosclerotic calcifications without aneurysmal dilatation or dissection. No cardiac enlargement is seen. No significant coronary calcifications are noted. The pulmonary artery shows a normal branching pattern. No focal filling defect to suggest pulmonary embolism is seen. Mediastinum/Nodes: Thoracic inlet is within normal limits. Left chest wall port is seen. Needle is noted within the port. No significant hilar or mediastinal adenopathy is noted. The esophagus  as visualized is within normal limits. Lungs/Pleura: The lungs are well aerated. No focal infiltrate or sizable effusion is seen. No pneumothorax or sizable parenchymal nodules are noted. Upper Abdomen: Visualized upper abdomen shows no acute abnormality. Musculoskeletal: Degenerative changes of the thoracic spine are noted. No acute rib abnormality is seen. Review of the MIP images confirms the above findings. IMPRESSION: No evidence of  pulmonary emboli. No other focal abnormality is seen. Aortic Atherosclerosis (ICD10-I70.0). Electronically Signed   By: Inez Catalina M.D.   On: 08/08/2020 17:59   MR Brain W Wo Contrast  Result Date: 07/17/2020 CLINICAL DATA:  Right-sided facial pain for 4-5 months. Facial tumor. Possible perineural spread. EXAM: MRI HEAD WITHOUT AND WITH CONTRAST TECHNIQUE: Multiplanar, multiecho pulse sequences of the brain and surrounding structures were obtained without and with intravenous contrast. CONTRAST:  47mL GADAVIST GADOBUTROL 1 MMOL/ML IV SOLN COMPARISON:  Maxillofacial CT 07/10/2020 FINDINGS: Brain: No acute infarct, acute hemorrhage or extra-axial collection. Multifocal hyperintense T2-weighted signal within the white matter. Normal volume of CSF spaces. No chronic microhemorrhage. Normal midline structures. There is abnormal thickening and contrast enhancement of the maxillary branch of the right trigeminal nerve within the right Meckel cave and foramen rotundum. Abnormal enhancement extends to the pterygopalatine fossa and along the lateral posterior aspect of the right maxillary sinus. There is also abnormal enhancement extending into the right pterygoid muscles. Vascular: Normal flow voids. Skull and upper cervical spine: Large right facial mass erodes through the right maxilla including the floor of the maxillary sinus, the right half of the hard palate and the right maxillary alveolar process. Sinuses/Orbits: Erosive mass involving the right maxillary sinus. Otherwise, the paranasal sinuses are clear. There is abnormal enhancement along the right infraorbital nerve. Other: None IMPRESSION: 1. Large right facial mass with right maxillary erosion involving the maxillary sinus, hard palate and pterygoid plates and muscles. 2. Perineural spread along multiple branches of the maxillary division of the right trigeminal nerve at the infraorbital canal, pterygopalatine fossa and foramen rotundum. Electronically  Signed   By: Ulyses Jarred M.D.   On: 07/17/2020 20:04   NM PET Image Initial (PI) Skull Base To Thigh  Result Date: 07/17/2020 CLINICAL DATA:  Initial treatment strategy for head and neck cancer. EXAM: NUCLEAR MEDICINE PET SKULL BASE TO THIGH TECHNIQUE: 7.45 mCi F-18 FDG was injected intravenously. Full-ring PET imaging was performed from the skull base to thigh after the radiotracer. CT data was obtained and used for attenuation correction and anatomic localization. Fasting blood glucose: 102 mg/dl COMPARISON:  CT maxillofacial with contrast 07/10/2020 FINDINGS: Mediastinal blood pool activity: SUV max 1.99 Liver activity: SUV max NA NECK: The previously characterized soft tissue mass involving the right maxilla, right buccal space, floor of right nasal cavity and right pterygoid palatine fossa, right orbital apex, right soft palate and right palatine tonsil is intensely FDG avid and has an SUV max of 13.74. The recently described suspicious, 7 mm right level 1a lymph node has an SUV max of 2.12. Incidental CT findings: none CHEST: No hypermetabolic mediastinal or hilar nodes. No suspicious pulmonary nodules on the CT scan. Incidental CT findings: Aortic atherosclerosis. ABDOMEN/PELVIS: No abnormal hypermetabolic activity within the liver, pancreas, adrenal glands, or spleen. No hypermetabolic lymph nodes in the abdomen or pelvis. Incidental CT findings: Cholecystectomy.  Aortic atherosclerosis. SKELETON: No signs of FDG avid distant osseous metastasis. Incidental CT findings: none IMPRESSION: 1. Intense FDG uptake is associated with the large enhancing tumor which involves the right maxilla, right nasal  cavity, right pterygoid palatine fossa, right orbital apex and right soft tissue palate and palatine tonsil. 2. Mild nonspecific FDG uptake is associated with the recently characterized suspicious right level 1A lymph node which measures 7 mm and has an SUV max of 2.12. 3. No signs of FDG avid distant  metastatic disease. 4.  Aortic Atherosclerosis (ICD10-I70.0). Electronically Signed   By: Kerby Moors M.D.   On: 07/17/2020 15:26   US Venous Img Lower Bilateral (DVT)  Result Date: 08/08/2020 CLINICAL DATA:  Positive D-dimer. EXAM: BILATERAL LOWER EXTREMITY VENOUS DOPPLER ULTRASOUND TECHNIQUE: Gray-scale sonography with compression, as well as color and duplex ultrasound, were performed to evaluate the deep venous system(s) from the level of the common femoral vein through the popliteal and proximal calf veins. COMPARISON:  None. FINDINGS: VENOUS Normal compressibility of the common femoral, superficial femoral, and popliteal veins, as well as the visualized calf veins. Visualized portions of profunda femoral vein and great saphenous vein unremarkable. No filling defects to suggest DVT on grayscale or color Doppler imaging. Doppler waveforms show normal direction of venous flow, normal respiratory plasticity and response to augmentation. OTHER None. Limitations: none IMPRESSION: Negative. Electronically Signed   By: Constance Holster M.D.   On: 08/08/2020 18:27   CT Maxillofacial W Contrast  Result Date: 07/10/2020 CLINICAL DATA:  64 year old female with right side facial swelling and pain. Discolored areas of oral mucosa on that side. EXAM: CT MAXILLOFACIAL WITH CONTRAST TECHNIQUE: Multidetector CT imaging of the maxillofacial structures was performed with intravenous contrast. Multiplanar CT image reconstructions were also generated. CONTRAST:  10mL OMNIPAQUE IOHEXOL 300 MG/ML  SOLN COMPARISON:  CT head and CTA head and neck 06/15/2018. FINDINGS: Osseous: New since 2019, erosion throughout the floor of the right maxillary sinus, the right half of the hard palate (series 7, image 26) and the entire right maxillary alveolar process. The right maxillary sinus was clear in 2019, but now demonstrates a combination of low-density and enhancing soft tissue, presumably tumor, which infiltrates through the  eroded floor of the right maxilla and to the right buccal space and oral cavity (series 6, image 31). The soft tissue abnormality extends throughout much of the floor of the right nasal cavity. The right pterygoid plates are also partially eroded. The abnormal enhancing soft tissue has indistinct margins but encompasses an area of roughly 5 cm (series 2, image 47, series 6, image 31). The right pterygoid palatine fossa is infiltrated (series 2, image 35). The posterior margin of tumor is in proximity to the right mandible on series 2, image 52, but the mandible and TMJ appear spared. The clivus and remaining central skull base also appear intact. Right frontotemporal craniotomy changes are new since 2019. No other bone erosion or suspicious bone lesion identified. Orbits: The right orbital floor remains intact but is abutted by abnormal soft tissue in the right maxillary sinus. There may be tumor at the right orbital apex tracking cephalad from the pterygoid palatine fossa, but otherwise no mass or inflammation within the right orbit. The other orbital walls are intact. The left orbit also appears negative. Sinuses: Destruction of the right maxilla with abnormal enhancing and low-density masslike soft tissue within the sinus in involving the floor of the right nasal cavity as stated above. Elsewhere the nasal cavity and other paranasal sinuses seem to remain normal. Mild retained secretions and/or mucosal thickening suspected in the right sphenoid sinus. The tympanic cavities and mastoids are clear. Soft tissues: Bulky masslike soft tissue centered at the right  maxillary alveolus and involving the right buccal space and oral cavity (series 2, image 55). Involvement of the anterior right pterygoid musculature. The remaining right masticator space appears spared. In addition to cephalad spread to the right pterygoid palatine fossa there appears to be contiguous tumor along the soft palate and right tonsillar pillar on  series 2, image 48. The remaining pharyngeal contours are within normal limits. The right parapharyngeal and retropharyngeal spaces remain within normal limits. Central sublingual space, submandibular glands and parotid glands are within normal limits. Small but asymmetric 7 mm right level 2A lymph node on series 2, image 70. On image 69 in the left lateral sublingual space there is an indeterminate 7 mm enhancing soft tissue nodule. This seems too posterior for the left sublingual gland. Bilateral level 2 lymph nodes measure 7-9 mm short axis and appears symmetric. The visible major vascular structures in the upper neck appear patent. Limited intracranial: The right cavernous sinus seems to remain patent. However, there is a subtle asymmetric enhancing soft tissue nodule measuring 4-5 mm at the right foramen rotundum and abutting the cavernous sinus on series 6, image 46. Definite intracranial 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 IMPRESSION: 1. Bulky and indistinct enhancing tumor which has aggressively destroyed the right maxilla, infiltrates along the right buccal space, floor of the right nasal cavity, into the right pterygoid palatine fossa, the right orbital apex, and also the right soft palate and palatine tonsil. Furthermore, there is evidence of intracranial extension along the right V2 at the foramen rotundum. 2. Small but suspicious right level 1A lymph node measuring 7 mm. And there is a separate small indeterminate but suspicious enhancing soft tissue nodule of the left sublingual space. Electronically Signed   By: Genevie Ann M.D.   On: 07/10/2020 16:26   DG Chest Port 1 View  Result Date: 08/05/2020 CLINICAL DATA:  Status post Port-A-Cath placement EXAM: PORTABLE CHEST 1 VIEW COMPARISON:  None. FINDINGS: Left-sided Port-A-Cath with the tip projecting over the SVC. No focal consolidation. No pleural effusion or pneumothorax. Heart and mediastinal contours are unremarkable. No  acute osseous abnormality. IMPRESSION: Left-sided Port-A-Cath with the tip projecting over the SVC. Electronically Signed   By: Kathreen Devoid   On: 08/05/2020 11:52   DG C-Arm 1-60 Min-No Report  Result Date: 08/05/2020 Fluoroscopy was utilized by the requesting physician.  No radiographic interpretation.    Assessment and plan-  #Atrial fibrillation, with rapid ventricular response, She has been evaluated by cardiology.  Rate is controlled and her rhythm has converted to sinus rhythm today. Was recommended to stay on Beta-blocker Based on her chads score, anticoagulation was recommended.  Eliquis 5 mg twice daily. I discussed with Dr. Rockey Situ.  No restriction of anticoagulation from oncology standpoint view.  #ST elevation, elevated troponin likely demand ischemia.  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 Continue Peridex mouth rinse  #Hypokalemia has resolved. #Anemia, hemoglobin has decreased.  Close monitor. #Leukocytosis, likely reactive.  No clinical signs of infection.  Chest x-ray CT chest no acute changes.  UA negative for nitrates and leukocyte esterase.  Continue monitor. #Neoplasm related pain, continue current pain regimen with fentanyl patch and Percocet 5/325 1 tablet every 4-6 hours as needed.  Continue gabapentin   Thank you for allowing me to participate in the care of this patient.   Earlie Server, MD,  PhD Hematology [place] at [facility] Pager- [phone] 08/09/2020

## 2020-08-09 NOTE — Progress Notes (Signed)
   Covid-19 Vaccination Clinic  Name:  TYKERRIA MCCUBBINS    MRN: [id number] DOB: 1956/08/14  08/09/2020  Ms. Lieurance was observed post Covid-19 immunization for 15 minutes without incident. She was provided with Vaccine Information Sheet and instruction to access the V-Safe system.   Ms. Padgett was instructed to call 911 with any severe reactions post vaccine: Marland Kitchen Difficulty breathing  . Swelling of face and throat  . A fast heartbeat  . A bad rash all over body  . Dizziness and weakness

## 2020-08-09 NOTE — Progress Notes (Signed)
[place] for heparin infusion  Indication: atrial fibrillation  Allergies  Allergen Reactions  . Aspirin Swelling and Other (See Comments)  . Belladonna Alkaloids Rash  . Fluoxetine Hcl Other (See Comments) and Rash  . Naproxen Swelling, Rash and Other (See Comments)  . Phenobarbital Rash and Other (See Comments)  . Prozac [Fluoxetine Hcl] Rash   Patient Measurements: Height: 5\' 6"  (167.6 cm) Weight: 61.2 kg (135 lb) IBW/kg (Calculated) : 59.3  Vital Signs: BP: 171/96 (10/08 0430) Pulse Rate: 55 (10/08 0430)  Labs: Recent Labs    08/08/20 0821 08/08/20 0955 08/08/20 1338 08/08/20 1338 08/08/20 1932 08/08/20 1951 08/08/20 2222 08/08/20 2359 08/09/20 0421  HGB 12.7  --   --   --   --   --   --   --  10.8*  HCT 36.2  --   --   --   --   --   --   --  31.5*  PLT 425*  --   --   --   --   --   --   --  362  APTT  --   --  34  --   --   --   --   --   --   LABPROT  --   --  13.8  --   --   --   --   --   --   INR  --   --  1.1  --   --   --   --   --   --   HEPARINUNFRC  --   --   --   --   --  <0.10*  --   --  0.19*  CREATININE <0.30*  --   --   --   --   --   --   --   --   TROPONINIHS  --    < > 60*   < > 103*  --  91* 79*  --    < > = values in this interval not displayed.    CrCl cannot be calculated (This lab value cannot be used to calculate CrCl because it is not a number: <0.30).  Medical History: Past Medical History:  Diagnosis Date  . 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   Marland Kitchen Anxiety   . Arthritis   . Bipolar disorder ([place])   . COPD (chronic obstructive pulmonary disease) ([place])    NO INHALERS  . Depression   . Dysrhythmia    H/O V TACH  . 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 Headache    H/O MIGRAINES  . Hypertension   . Seizures ([place])    X1 AFTER FALL   Assessment: 64 yo female with new onset A. Fib with RVR. Pharmacy consulted for heparin infusion  dosing and monitoring.   Goal of Therapy:  Heparin level 0.3-0.7 units/ml Monitor platelets by anticoagulation protocol: Yes   Plan:  10/7 1951 HL < 0.10 subtherapeutic. Heparin 1500 unit bolus followed by increase in heparin drip to 1050 units/hr. Recheck HL 10/8 at 0400. CBC daily.  10/8: HL @ 0421 = 0.19 Will order Heparin 1800 units IV X 1 bolus and increase drip rate to 1250 units/hr.  Will recheck HL 6 hrs after rate change.   Orene Desanctis, PharmD Clinical Pharmacist 08/09/2020 5:00 AM

## 2020-08-09 NOTE — ED Notes (Signed)
At pts request I contacted her roommate, [name], via phone and gave her pts room number [phone]

## 2020-08-09 NOTE — Progress Notes (Signed)
[place] at [place] NAME: Shannon Schroeder    MR#:  [id number]  DATE OF BIRTH:  08-23-1956  SUBJECTIVE:  patient was sent from the cancer center after she was noted to be tachycardic found to be in atrial fibrillation new onset. She was started on IV diltiazem drip received metoprolol. Currently in sinus rhythm. Appears fatigued tired.  HR in the 50-60 REVIEW OF SYSTEMS:   Review of Systems  Constitutional: Positive for malaise/fatigue. Negative for chills, fever and weight loss.  HENT: Negative for ear discharge, ear pain and nosebleeds.   Eyes: Negative for blurred vision, pain and discharge.  Respiratory: Negative for sputum production, shortness of breath, wheezing and stridor.   Cardiovascular: Negative for chest pain, palpitations, orthopnea and PND.  Gastrointestinal: Negative for abdominal pain, diarrhea, nausea and vomiting.  Genitourinary: Negative for frequency and urgency.  Musculoskeletal: Negative for back pain and joint pain.  Neurological: Positive for weakness. Negative for sensory change, speech change and focal weakness.  Psychiatric/Behavioral: Negative for depression and hallucinations. The patient is not nervous/anxious.    Tolerating Diet: yes Tolerating PT: ambulatory  DRUG ALLERGIES:   Allergies  Allergen Reactions  . Aspirin Swelling and Other (See Comments)  . Belladonna Alkaloids Rash  . Fluoxetine Hcl Other (See Comments) and Rash  . Naproxen Swelling, Rash and Other (See Comments)  . Phenobarbital Rash and Other (See Comments)  . Prozac [Fluoxetine Hcl] Rash    VITALS:  Blood pressure (!) 172/84, pulse 63, temperature 98 F (36.7 C), temperature source Oral, resp. rate 16, height 5\' 6"  (1.676 m), weight 61.2 kg, last menstrual period 11/19/1988, SpO2 96 %.  PHYSICAL EXAMINATION:   Physical Exam  GENERAL:  64 y.o.-year-old patient lying in the bed with no acute distress.  HEENT: Head atraumatic,  normocephalic. Puffiness of right cheek. edentulous NECK:  Supple, no jugular venous distention. No thyroid enlargement, no tenderness.  LUNGS: Normal breath sounds bilaterally, no wheezing, rales, rhonchi. No use of accessory muscles of respiration.  CARDIOVASCULAR: S1, S2 normal. No murmurs, rubs, or gallops.  ABDOMEN: Soft, nontender, nondistended. Bowel sounds present. No organomegaly or mass.  EXTREMITIES: No cyanosis, clubbing or edema b/l.    NEUROLOGIC: Cranial nerves II through XII are intact. No focal Motor or sensory deficits b/l.   PSYCHIATRIC:  patient is alert and oriented x 3.  SKIN: No obvious rash, lesion, or ulcer.   LABORATORY PANEL:  CBC Recent Labs  Lab 08/09/20 0421  WBC 19.2*  HGB 10.8*  HCT 31.5*  PLT 362    Chemistries  Recent Labs  Lab 08/08/20 0821 08/08/20 0821 08/09/20 0421  NA 133*   < > 132*  K 2.9*   < > 4.0  CL 93*   < > 97*  CO2 29   < > 27  GLUCOSE 117*   < > 107*  BUN 10   < > 11  CREATININE <0.30*   < > 0.39*  CALCIUM 8.7*   < > 8.6*  MG 2.1  --   --   AST 12*  --   --   ALT 11  --   --   ALKPHOS 101  --   --   BILITOT 0.8  --   --    < > = values in this interval not displayed.   Cardiac Enzymes No results for input(s): TROPONINI in the last 168 hours. RADIOLOGY:  DG Chest 2 View  Result Date: 08/08/2020 CLINICAL  DATA:  Tachycardia EXAM: CHEST - 2 VIEW COMPARISON:  August 05, 2020 FINDINGS: The cardiomediastinal silhouette is unchanged in contour.LEFT chest port with tip terminating over the SVC. No pleural effusion. No pneumothorax. No acute pleuroparenchymal abnormality. Visualized abdomen is unremarkable. Multilevel degenerative changes of the thoracic spine. IMPRESSION: No acute cardiopulmonary abnormality. Electronically Signed   By: Valentino Saxon MD   On: 08/08/2020 10:30   CT ANGIO CHEST PE W OR WO CONTRAST  Result Date: 08/08/2020 CLINICAL DATA:  History of head and neck cancer with hypotension and shortness of  breath EXAM: CT ANGIOGRAPHY CHEST WITH CONTRAST TECHNIQUE: Multidetector CT imaging of the chest was performed using the standard protocol during bolus administration of intravenous contrast. Multiplanar CT image reconstructions and MIPs were obtained to evaluate the vascular anatomy. CONTRAST:  60mL OMNIPAQUE IOHEXOL 350 MG/ML SOLN COMPARISON:  Chest x-ray from earlier in the same day. FINDINGS: Cardiovascular: Thoracic aorta demonstrates atherosclerotic calcifications without aneurysmal dilatation or dissection. No cardiac enlargement is seen. No significant coronary calcifications are noted. The pulmonary artery shows a normal branching pattern. No focal filling defect to suggest pulmonary embolism is seen. Mediastinum/Nodes: Thoracic inlet is within normal limits. Left chest wall port is seen. Needle is noted within the port. No significant hilar or mediastinal adenopathy is noted. The esophagus as visualized is within normal limits. Lungs/Pleura: The lungs are well aerated. No focal infiltrate or sizable effusion is seen. No pneumothorax or sizable parenchymal nodules are noted. Upper Abdomen: Visualized upper abdomen shows no acute abnormality. Musculoskeletal: Degenerative changes of the thoracic spine are noted. No acute rib abnormality is seen. Review of the MIP images confirms the above findings. IMPRESSION: No evidence of pulmonary emboli. No other focal abnormality is seen. Aortic Atherosclerosis (ICD10-I70.0). Electronically Signed   By: Inez Catalina M.D.   On: 08/08/2020 17:59   US Venous Img Lower Bilateral (DVT)  Result Date: 08/08/2020 CLINICAL DATA:  Positive D-dimer. EXAM: BILATERAL LOWER EXTREMITY VENOUS DOPPLER ULTRASOUND TECHNIQUE: Gray-scale sonography with compression, as well as color and duplex ultrasound, were performed to evaluate the deep venous system(s) from the level of the common femoral vein through the popliteal and proximal calf veins. COMPARISON:  None. FINDINGS: VENOUS  Normal compressibility of the common femoral, superficial femoral, and popliteal veins, as well as the visualized calf veins. Visualized portions of profunda femoral vein and great saphenous vein unremarkable. No filling defects to suggest DVT on grayscale or color Doppler imaging. Doppler waveforms show normal direction of venous flow, normal respiratory plasticity and response to augmentation. OTHER None. Limitations: none IMPRESSION: Negative. Electronically Signed   By: Constance Holster M.D.   On: 08/08/2020 18:27   ECHOCARDIOGRAM COMPLETE  Result Date: 08/09/2020    ECHOCARDIOGRAM REPORT   Patient Name:   Shannon Schroeder Date of Exam: 08/09/2020 Medical Rec #:  [id number]           Height:       66.0 in Accession #:    [id number]          Weight:       135.0 lb Date of Birth:  24-Apr-1956            BSA:          1.692 m Patient Age:    83 years            BP:           178/86 mmHg Patient Gender: F  HR:           60 bpm. Exam Location:  [facility] Procedure: 2D Echo, Color Doppler and Cardiac Doppler Indications:     Atrial Fibrillation 427.31  History:         Patient has prior history of Echocardiogram examinations, most                  recent 04/07/2019. COPD; Risk Factors:Hypertension. Bipolar                  disorder.  Sonographer:     Sherrie Sport RDCS (AE) Referring Phys:  [place] Diagnosing Phys: Ida Rogue MD IMPRESSIONS  1. Left ventricular ejection fraction, by estimation, is 60 to 65%. The left ventricle has normal function. The left ventricle has no regional wall motion abnormalities. There is mild left ventricular hypertrophy. Left ventricular diastolic parameters are consistent with Grade II diastolic dysfunction (pseudonormalization).  2. Right ventricular systolic function is normal. The right ventricular size is normal. There is normal pulmonary artery systolic pressure. The estimated right ventricular systolic pressure is 96.7 mmHg.  3. The mitral valve is  normal in structure. Mild mitral valve regurgitation. FINDINGS  Left Ventricle: Left ventricular ejection fraction, by estimation, is 60 to 65%. The left ventricle has normal function. The left ventricle has no regional wall motion abnormalities. The left ventricular internal cavity size was normal in size. There is  mild left ventricular hypertrophy. Left ventricular diastolic parameters are consistent with Grade II diastolic dysfunction (pseudonormalization). Right Ventricle: The right ventricular size is normal. No increase in right ventricular wall thickness. Right ventricular systolic function is normal. There is normal pulmonary artery systolic pressure. The tricuspid regurgitant velocity is 2.02 m/s, and  with an assumed right atrial pressure of 5 mmHg, the estimated right ventricular systolic pressure is 59.1 mmHg. Left Atrium: Left atrial size was normal in size. Right Atrium: Right atrial size was normal in size. Pericardium: There is no evidence of pericardial effusion. Mitral Valve: The mitral valve is normal in structure. Mild mitral valve regurgitation. No evidence of mitral valve stenosis. Tricuspid Valve: The tricuspid valve is normal in structure. Tricuspid valve regurgitation is mild . No evidence of tricuspid stenosis. Aortic Valve: The aortic valve was not well visualized. Aortic valve regurgitation is trivial. No aortic stenosis is present. Aortic valve mean gradient measures 3.5 mmHg. Aortic valve peak gradient measures 6.4 mmHg. Aortic valve area, by VTI measures 3.73 cm. Pulmonic Valve: The pulmonic valve was normal in structure. Pulmonic valve regurgitation is not visualized. No evidence of pulmonic stenosis. Aorta: The aortic root is normal in size and structure. Venous: The inferior vena cava is normal in size with greater than 50% respiratory variability, suggesting right atrial pressure of 3 mmHg. IAS/Shunts: No atrial level shunt detected by color flow Doppler.  LEFT VENTRICLE PLAX 2D  LVIDd:         4.07 cm  Diastology LVIDs:         2.71 cm  LV e' medial:    5.77 cm/s LV PW:         1.29 cm  LV E/e' medial:  13.8 LV IVS:        0.88 cm  LV e' lateral:   9.14 cm/s LVOT diam:     2.10 cm  LV E/e' lateral: 8.7 LV SV:         86 LV SV Index:   51 LVOT Area:     3.46 cm  RIGHT VENTRICLE RV Basal diam:  3.75 cm RV S prime:     12.00 cm/s TAPSE (M-mode): 3.4 cm LEFT ATRIUM           Index       RIGHT ATRIUM           Index LA diam:      3.20 cm 1.89 cm/m  RA Area:     15.30 cm LA Vol (A2C): 27.9 ml 16.49 ml/m RA Volume:   39.50 ml  23.34 ml/m LA Vol (A4C): 38.6 ml 22.81 ml/m  AORTIC VALVE                   PULMONIC VALVE AV Area (Vmax):    2.70 cm    PV Vmax:        0.85 m/s AV Area (Vmean):   3.01 cm    PV Peak grad:   2.9 mmHg AV Area (VTI):     3.73 cm    RVOT Peak grad: 3 mmHg AV Vmax:           126.50 cm/s AV Vmean:          87.700 cm/s AV VTI:            0.230 m AV Peak Grad:      6.4 mmHg AV Mean Grad:      3.5 mmHg LVOT Vmax:         98.60 cm/s LVOT Vmean:        76.300 cm/s LVOT VTI:          0.248 m LVOT/AV VTI ratio: 1.08  AORTA Ao Root diam: 2.60 cm MITRAL VALVE               TRICUSPID VALVE MV Area (PHT): 3.63 cm    TR Peak grad:   16.3 mmHg MV Decel Time: 209 msec    TR Vmax:        202.00 cm/s MV E velocity: 79.70 cm/s MV A velocity: 71.60 cm/s  SHUNTS MV E/A ratio:  1.11        Systemic VTI:  0.25 m                            Systemic Diam: 2.10 cm Ida Rogue MD Electronically signed by Ida Rogue MD Signature Date/Time: 08/09/2020/2:59:39 PM    Final    ASSESSMENT AND PLAN:  Shannon Schroeder is a 64 y.o. female with medical history significant of head/neck and maxillary sinus CA, HTN, COPD, TIA, tobacco abuse, aneurysm of anterior cerebral artery, bipolar, depression, who presents with tachycardia. Pt was recently diagnosed today with head/neck and maxillary sinus cancer. Pt went for radiation treatment today. She started feeling bad and weak. She was found to  have tachycardia and hypotension EKG showed a fib with RVR. No hx of A fib  Atrial fibrillation with RVR ([place]): this is new onset A fib with RVR.  - Dr. Donivan Scull input appreciated.  - IV heparin-- PO eliquis 5 mg BID -IV Cardizem drip-- weaned off -Dr. Rockey Situ started patient on  Metoprolol 50 mg bid - 2d echo -- EF 60-65%  Elevated troponin: trop 25.  Patient denies chest pain.  Likely due to demand ischemia. -Trending troponin down -Patient is allergic to aspirin  Hypertension -tolerating beta-blockers, Lisinopril, Norvasc  COPD (chronic obstructive pulmonary disease) (HCC) -Bronchodilators prn  Tobacco abuse -Nicotine patch -advised cessation  History of TIA (transient ischemic attack): -  patient is not taking medications currently. 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 -follows with Dr. Tasia Catchings of oncology  Hypokalemia:  Magnesium 2.1 -Repleted potassium  Leukocytosis and SIRS (systemic inflammatory response syndrome) ([place]):  -improved vitals -Patient meets criteria for SIRS with leukocytosis, tachycardia.  Lactic acid 2.0, 1.8.  No source of infection identified, therefore not a diagnosis of sepsis. No fever. -procalcitonin level <0.01    DVT ppx: on IV heparin Code Status: Full code ( dicussed with pt) Family Communication: not done, no family member is at bed side.  Disposition Plan:  Anticipate discharge back to previous environment Consults called:  Dr. Tasia Catchings of oncology and Dr. Rockey Situ of cardiology Admission status: Inpatient  Dispo:  patient is from: Home  Anticipated d/c is to: Home  Anticipated d/c date is: likely tomorrow  Patient currently is not medically stable to d/c. Patient came in with rapid a fib with RVR. She was on IV Cardizem drip and IV heparin drip. Switch to oral metoprolol. Monitoring heart  rate since drop down in the 50s. Patient is so far tolerating meds well.      TOTAL TIME TAKING CARE OF THIS PATIENT: *25* minutes.  >50% time spent on counselling and coordination of care  Note: This dictation was prepared with Dragon dictation along with smaller phrase technology. Any transcriptional errors that result from this process are unintentional.  Fritzi Mandes M.D    [facility]   CC: Primary care physician; Pcp, NoPatient ID: Shannon Schroeder, female   DOB: [birth year], 64 y.o.   MRN: [id number]

## 2020-08-10 ENCOUNTER — Other Ambulatory Visit: Payer: Self-pay | Admitting: Cardiovascular Disease

## 2020-08-10 ENCOUNTER — Inpatient Hospital Stay: Payer: Medicare Other

## 2020-08-10 LAB — CBC
HCT: 35.3 % — ABNORMAL LOW (ref 36.0–46.0)
Hemoglobin: 12 g/dL (ref 12.0–15.0)
MCH: 33.8 pg (ref 26.0–34.0)
MCHC: 34 g/dL (ref 30.0–36.0)
MCV: 99.4 fL (ref 80.0–100.0)
Platelets: 396 10*3/uL (ref 150–400)
RBC: 3.55 MIL/uL — ABNORMAL LOW (ref 3.87–5.11)
RDW: 13.4 % (ref 11.5–15.5)
WBC: 21.4 10*3/uL — ABNORMAL HIGH (ref 4.0–10.5)
nRBC: 0 % (ref 0.0–0.2)

## 2020-08-10 IMAGING — CR DG CHEST 2V
1 series · 2 of 2 positions shown · non-contrast
Comparison: Chest CT [DATE]

CLINICAL DATA: Dizziness.

EXAM:
CHEST - 2 VIEW

[Series 1: dg chest 2 view · 0.14mm/px · 2 of 2 slices shown]
[im 1/2]
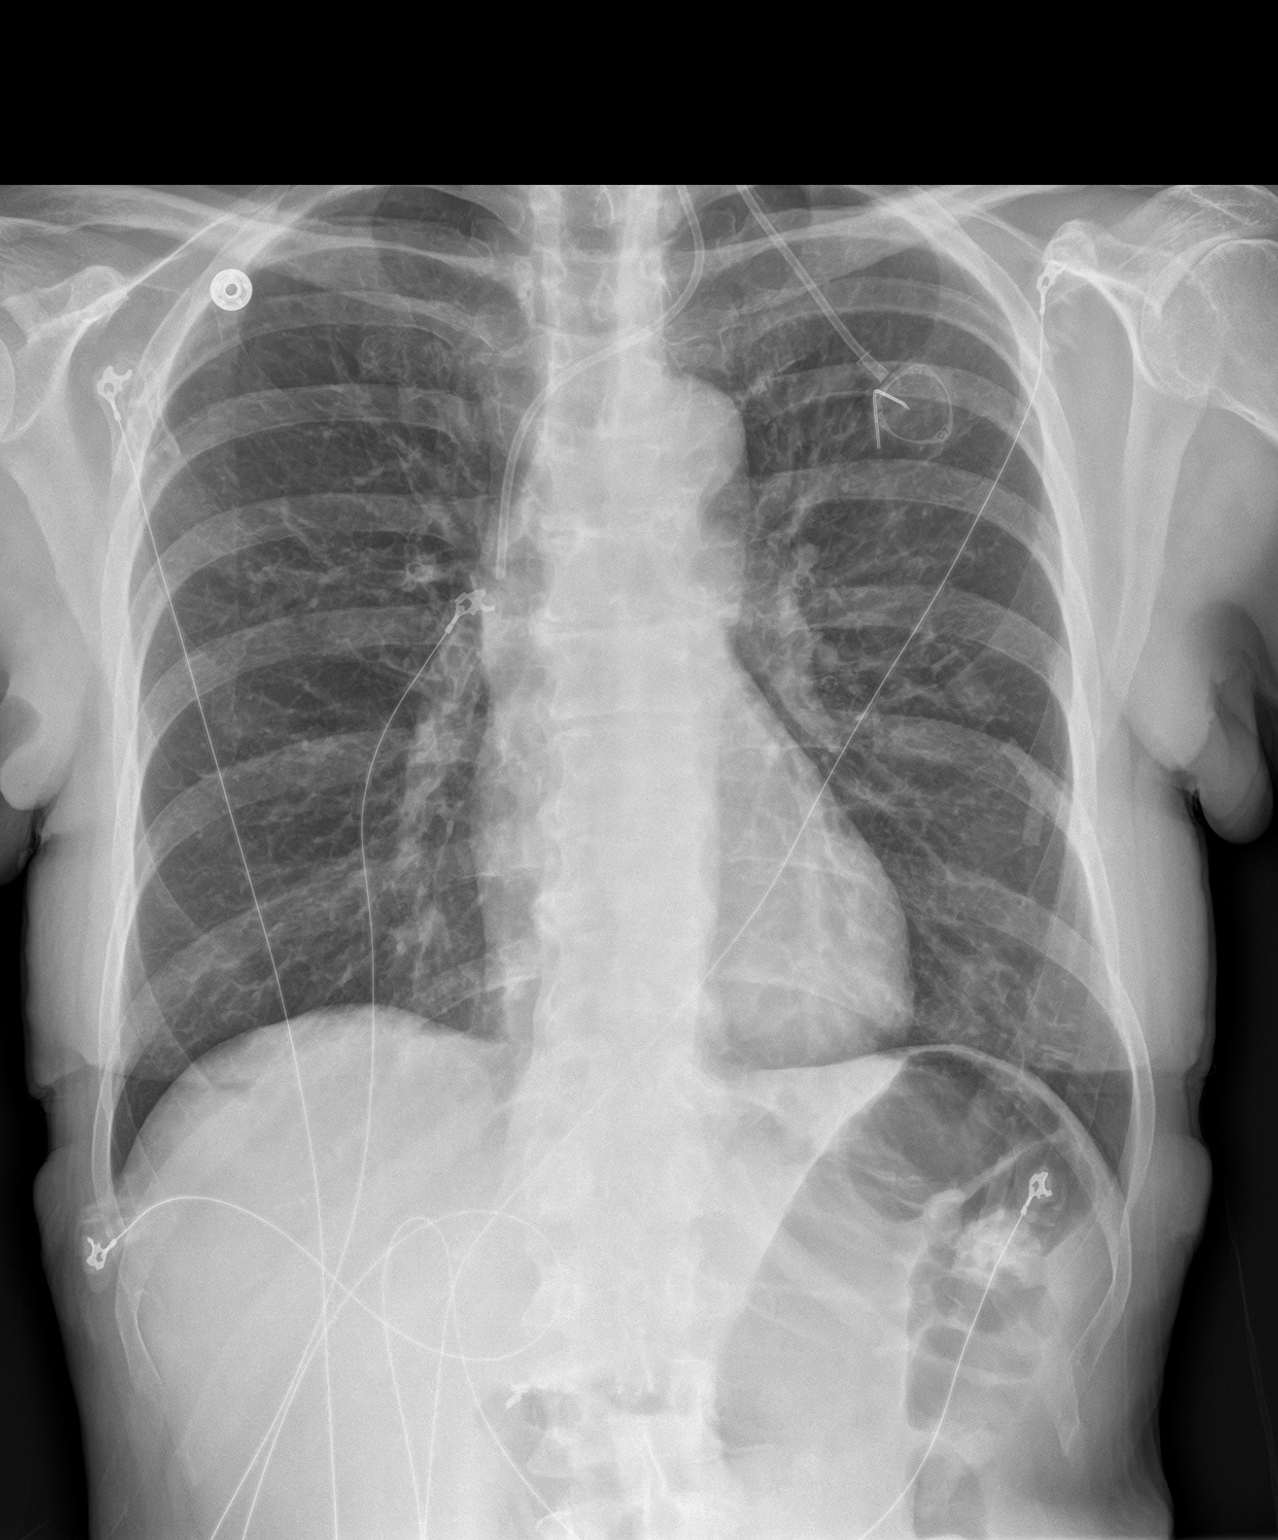
[im 2/2]
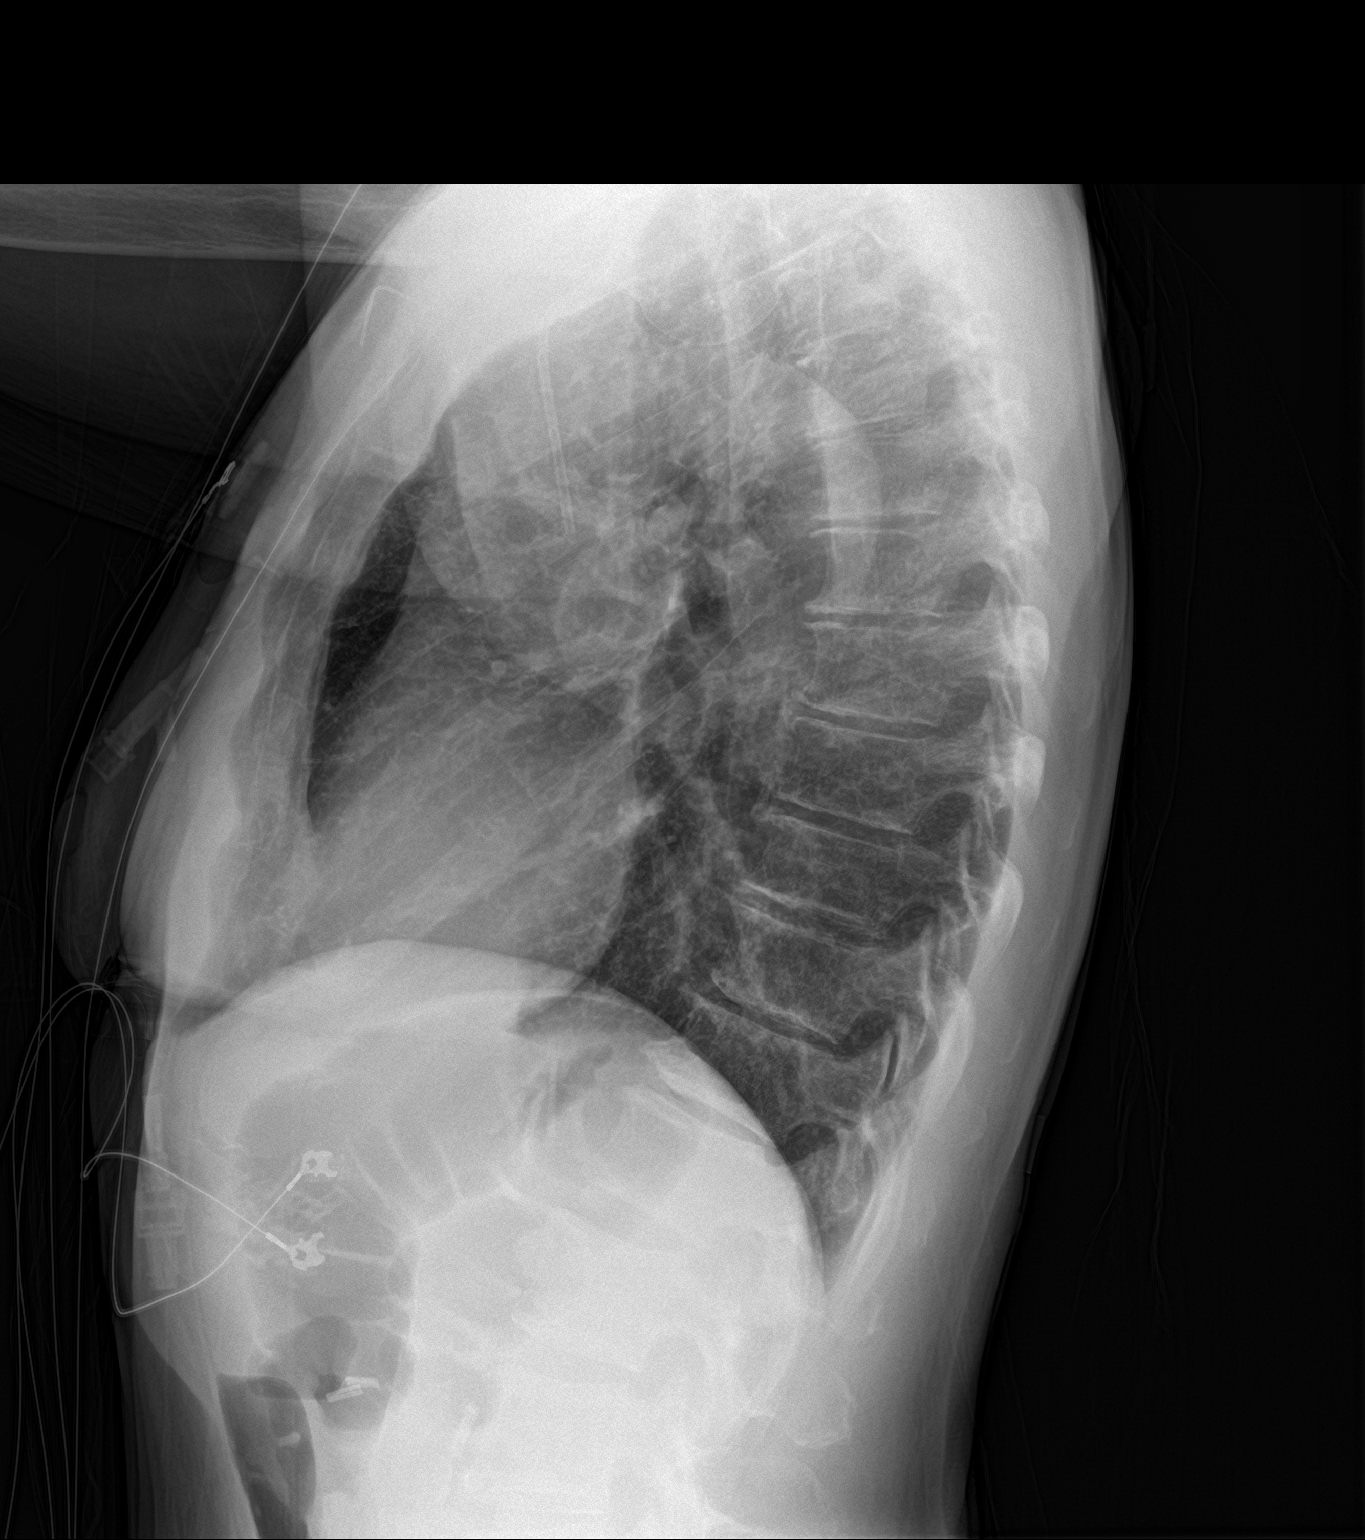

[2 of 2 positions shown; findings below may reference images not displayed]

FINDINGS: Stable cardiac and mediastinal contours. Port-A-Cath tip projects
over the superior vena cava. Monitoring leads overlie the patient.
No large area of pulmonary consolidation. No pleural effusion or
pneumothorax. Thoracic spine degenerative changes.
IMPRESSION: No active cardiopulmonary disease.

## 2020-08-10 MED ORDER — NICOTINE 21 MG/24HR TD PT24: 21.0000 mg | MEDICATED_PATCH | Freq: Every day | TRANSDERMAL | 0 refills | Status: DC

## 2020-08-10 MED ORDER — HEPARIN SOD (PORK) LOCK FLUSH 100 UNIT/ML IV SOLN
500.0000 [IU] | INTRAVENOUS | Status: AC | PRN
  Administered 2020-08-10: 500 [IU]
  Filled 2020-08-10: qty 5

## 2020-08-10 MED ORDER — APIXABAN 5 MG PO TABS
5.0000 mg | ORAL_TABLET | Freq: Two times a day (BID) | ORAL | 0 refills | Status: DC
Start: 2020-08-10 — End: 2020-11-25

## 2020-08-10 MED ORDER — METOPROLOL TARTRATE 50 MG PO TABS
50.0000 mg | ORAL_TABLET | Freq: Two times a day (BID) | ORAL | 1 refills | Status: DC
Start: 2020-08-10 — End: 2020-09-11

## 2020-08-10 MED ORDER — HYDRALAZINE HCL 25 MG PO TABS: 25.0000 mg | ORAL_TABLET | Freq: Two times a day (BID) | ORAL | 1 refills | Status: DC

## 2020-08-10 MED ORDER — LISINOPRIL 40 MG PO TABS: 40.0000 mg | ORAL_TABLET | Freq: Every day | ORAL | 1 refills | Status: DC

## 2020-08-10 MED ORDER — ENSURE ENLIVE PO LIQD: 237.0000 mL | Freq: Three times a day (TID) | ORAL | 12 refills | Status: AC

## 2020-08-10 MED ORDER — HYDRALAZINE HCL 25 MG PO TABS
25.0000 mg | ORAL_TABLET | Freq: Two times a day (BID) | ORAL | Status: DC
  Administered 2020-08-10: 25 mg via ORAL
  Filled 2020-08-10: qty 1

## 2020-08-10 MED ORDER — AMLODIPINE BESYLATE 10 MG PO TABS
10.0000 mg | ORAL_TABLET | Freq: Every day | ORAL | 1 refills | Status: DC
Start: 2020-08-11 — End: 2020-08-26

## 2020-08-10 NOTE — Discharge Instructions (Signed)
Taste and Smell Changes Cancer and cancer treatment can cause changes in your senses of taste and smell.  How foods taste and smell can change from day to day, and these changes may  affect your appetite. To find foods that are appealing, try experimenting with new  foods or cuisines, marinades, spices, and ways of preparing what you eat. Good  oral care is also generally helpful.  Oral Care Tips . To keep your mouth clean and healthy, rinse and brush your teeth after  meals and before bed (or every four hours during the day).  . Before eating, rinse your mouth with a solution of 1 quart water,   teaspoon salt, and 1 teaspoon baking soda. This rinse can help keep  your mouth clean and improve your sense of taste.  . Avoid mouthwashes that contain alcohol, which can irritate a sore mouth.  Relief for Taste and Smell Problems Use the following tips to address specific problems that you have with taste and  smell.  Loss of Taste . Choose foods with tart flavors, such as lemon wedges, lemonade, citrus  fruits, vinegar, and pickled foods. (Caution: avoid these acidic foods if you  have a sore mouth or throat.) . Blend fresh fruits into shakes, ice cream, or yogurt.  . Eat frozen fruits, such as whole grapes and mandarin orange slices, or  chopped cantaloupe or watermelon. . Select fresh vegetables. They may be more appealing than canned or frozen  ones. Salty, Bitter, Acidic, or Metallic Tastes . Add sweeteners or a little bit of sugar to foods. A little sweetness can help to  increase pleasant tastes. . Season foods with herbs, spices, and other seasonings, such as onion, garlic,  chili powder, basil, oregano, rosemary, tarragon, barbecue sauce, mustard,  ketchup, or mint. . Use plastic utensils or chopsticks if metal forks and spoons taste unpleasant. . Add lemon juice or other flavorings to water. Celene Skeen on sugar-free lemon drops, gum, or mints.Meats Taste Strange . Choose other  protein-rich foods (such as poultry, fish, eggs, dairy products,  beans, tofu, and soy milk) instead of meat.  Delight Stare and cook meats in sweet juices, fruits, acidic dressings, or wine.  For example, try sweet-and-sour pork with pineapple, chicken with honey  glaze, or [place] broil in [place] dressing. Foods or Drinks Smell Unpleasant . Choose foods that do not need to be cooked, such as cold sandwiches,  crackers and cheese, yogurt and fruit, or cold cereal and milk. . Serve foods cold or at room temperature. Foods served hot often have  stronger odors and flavors. . Avoid the kitchen during meal cooking time to avoid food smells. . Eat in cool, well-ventilated rooms that don't have any food or cooking odors. . For protein drinks and other beverages, use a cover on the cup and drink  with a straw      Information on my medicine - ELIQUIS (apixaban)  This medication education was reviewed with me or my healthcare representative as part of my discharge preparation.   Why was Eliquis prescribed for you? Eliquis was prescribed for you to reduce the risk of a blood clot forming that can cause a stroke if you have a medical condition called atrial fibrillation (a type of irregular heartbeat).  What do You need to know about Eliquis ? Take your Eliquis TWICE DAILY - one tablet in the morning and one tablet in the evening with or without food. If you have difficulty swallowing the tablet whole please  discuss with your pharmacist how to take the medication safely.  Take Eliquis exactly as prescribed by your doctor and DO NOT stop taking Eliquis without talking to the doctor who prescribed the medication.  Stopping may increase your risk of developing a stroke.  Refill your prescription before you run out.  After discharge, you should have regular check-up appointments with your healthcare provider that is prescribing your Eliquis.  In the future your dose may need to be changed if  your kidney function or weight changes by a significant amount or as you get older.  What do you do if you miss a dose? If you miss a dose, take it as soon as you remember on the same day and resume taking twice daily.  Do not take more than one dose of ELIQUIS at the same time to make up a missed dose.  Important Safety Information A possible side effect of Eliquis is bleeding. You should call your healthcare provider right away if you experience any of the following: ? Bleeding from an injury or your nose that does not stop. ? Unusual colored urine (red or dark brown) or unusual colored stools (red or black). ? Unusual bruising for unknown reasons. ? A serious fall or if you hit your head (even if there is no bleeding).  Some medicines may interact with Eliquis and might increase your risk of bleeding or clotting while on Eliquis. To help avoid this, consult your healthcare provider or pharmacist prior to using any new prescription or non-prescription medications, including herbals, vitamins, non-steroidal anti-inflammatory drugs (NSAIDs) and supplements.  This website has more information on Eliquis (apixaban): http://www.eliquis.com/eliquis/home

## 2020-08-10 NOTE — Plan of Care (Signed)
  Problem: Education: Goal: Knowledge of General Education information will improve Description: Including pain rating scale, medication(s)/side effects and non-pharmacologic comfort measures Outcome: Adequate for Discharge   

## 2020-08-10 NOTE — Discharge Summary (Signed)
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   Marland Kitchen Anxiety   . Arthritis   . Bipolar disorder ([place])   . COPD (chronic obstructive pulmonary disease) ([place])    NO INHALERS  . Depression   . Dysrhythmia    H/O V TACH  . Head and neck cancer ([place]) 07/29/2020  . Headache    H/O MIGRAINES  . Hypertension   . Seizures ([name])    [place] COURSE:   [name] Ainsworthis a 64 y.o.femalewith medical history significant ofhead/neck andmaxillary sinus CA, HTN, COPD, TIA, tobacco abuse, aneurysm of anterior cerebral artery, bipolar, depression, who presents withtachycardia. Pt wasrecently diagnosed today with head/neck and maxillary sinus cancer.Pt went forradiation treatment today. She started feeling bad and weak. She was found to havetachycardia and hypotension EKG showed a fib with RVR. No hx of A fib  Atrial fibrillation with RVR ([name]): this is new onset A fib with RVR.  - Dr. Donivan Scull input appreciated.  - IV heparin--> PO eliquis 5 mg BID -IV Cardizem drip-- weaned off -Dr. Rockey Situ started patient on  Metoprolol 50 mg bid - 2d echo-- EF 60-65%  Elevated troponin: trop 25.Patient denies chest pain. Likely due to demand  ischemia. -Trending troponin down -Patient is allergic to aspirin -NO CP  Hypertension -tolerating beta-blockers, Lisinopril, Norvasc -added low dose hydralazine. BP 137/74 -pt apparently had not been taking her bp meds  COPD (chronic obstructive pulmonary disease) (HCC) -Bronchodilators prn  Tobacco abuse -Nicotine patch -advised cessation -given nicotine patch  History ofTIA (transient ischemic attack): -patient is not taking medications currently. -intolerant to asa -defer to PCP to d/w pt regarding statins and monitor Lipid profile  Recently diagnosed Head and neck cancerand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HCC):Patient is receiving radiation therapy currently. -follows with Dr. Tasia Catchings of oncology  Hypokalemia: Magnesium 2.1 -Repleted potassium  LeukocytosisandSIRS (systemic inflammatory response syndrome) ([place]): -improved vitals -Patient meets criteria for SIRS with leukocytosis, tachycardia.Lactic acid 2.0, 1.8. No source of infection identified,therefore not a diagnosis of sepsis. No fever. -procalcitonin level <0.01  Overall improved. Feels better. Ok from cardiology standpoint for discharge. Patient is in agreement.   DVT ppx:on IV heparin Code Status:Full code ( dicussed with pt) Family Communication: not done, no family member is at bed side.  Disposition Plan: Anticipate discharge back to previous environment Consults called:Dr. Tasia Catchings of oncology and Dr. Rockey Situ of cardiology Admission status: Inpatient  Dispo:  patient is from:Home Anticipated d/c is AC:ZYSA Anticipated d/c date is: today Patient currently is  medically stable to d/c.    CONSULTS OBTAINED:  Treatment Team:  Minna Merritts, MD  DRUG ALLERGIES:   Allergies  Allergen Reactions  . Aspirin Swelling and Other (See Comments)  . Belladonna Alkaloids Rash  . Fluoxetine Hcl Other (See Comments)  and Rash  . Naproxen Swelling,  Rash and Other (See Comments)  . Phenobarbital Rash and Other (See Comments)  . Prozac [Fluoxetine Hcl] Rash    DISCHARGE MEDICATIONS:   Allergies as of 08/10/2020      Reactions   Aspirin Swelling, Other (See Comments)   Belladonna Alkaloids Rash   Fluoxetine Hcl Other (See Comments), Rash   Naproxen Swelling, Rash, Other (See Comments)   Phenobarbital Rash, Other (See Comments)   Prozac [fluoxetine Hcl] Rash      Medication List    STOP taking these medications   HYDROcodone-acetaminophen 5-325 MG tablet Commonly known as: Norco     TAKE these medications   amLODipine 10 MG tablet Commonly known as: NORVASC Take 1 tablet (10 mg total) by mouth daily. Start taking on: August 11, 2020   apixaban 5 MG Tabs tablet Commonly known as: ELIQUIS Take 1 tablet (5 mg total) by mouth 2 (two) times daily.   b complex vitamins capsule Take 1 capsule by mouth daily. Gummie   CENTRUM SILVER PO Take 1 tablet by mouth daily. Gummie   feeding supplement (ENSURE ENLIVE) Liqd Take 237 mLs by mouth 3 (three) times daily between meals.   fentaNYL 25 MCG/HR Commonly known as: [place] 1 patch onto the skin every 3 (three) days.   gabapentin 300 MG capsule Commonly known as: NEURONTIN Take 1 capsule (300 mg total) by mouth 3 (three) times daily.   GINKOBA PO Take 1 tablet by mouth daily.   hydrALAZINE 25 MG tablet Commonly known as: APRESOLINE Take 1 tablet (25 mg total) by mouth 2 (two) times daily.   ibuprofen 400 MG tablet Commonly known as: ADVIL Take 400 mg by mouth every 6 (six) hours as needed for mild pain or moderate pain. Liquid Gel   lidocaine-prilocaine cream Commonly known as: EMLA Apply to affected area once What changed:   how much to take  how to take this  when to take this  reasons to take this   lisinopril 40 MG tablet Commonly known as: ZESTRIL Take 1 tablet (40 mg total) by mouth daily. Start taking on: August 11, 2020 What changed:    medication strength  how much to take   Melatonin 10 MG Tabs Take 10 mg by mouth at bedtime as needed (sleep).   metoprolol tartrate 50 MG tablet Commonly known as: LOPRESSOR Take 1 tablet (50 mg total) by mouth 2 (two) times daily.   nicotine 21 mg/24hr patch Commonly known as: NICODERM CQ - dosed in mg/24 hours Place 1 patch (21 mg total) onto the skin daily. Start taking on: August 11, 2020   oxyCODONE-acetaminophen 5-325 MG tablet Commonly known as: Percocet Take 1 tablet by mouth every 4 (four) hours as needed for severe pain.   prochlorperazine 10 MG tablet Commonly known as: COMPAZINE Take 1 tablet (10 mg total) by mouth every 6 (six) hours as needed (Nausea or vomiting).   Tylenol 8 Hour Arthritis Pain 650 MG CR tablet Generic drug: acetaminophen Take 1,300 mg by mouth daily as needed (Pain).            Discharge Care Instructions  (From admission, onward)         Start     Ordered   08/10/20 0000  Discharge wound care:       Comments: No open wounds   08/10/20 1109          If you experience worsening of your admission symptoms, develop shortness of  breath, life threatening emergency, suicidal or homicidal thoughts you must seek medical attention immediately by calling 911 or calling your MD immediately  if symptoms less severe.  You Must read complete instructions/literature along with all the possible adverse reactions/side effects for all the Medicines you take and that have been prescribed to you. Take any new Medicines after you have completely understood and accept all the possible adverse reactions/side effects.   Please note  You were cared for by a hospitalist during your hospital stay. If you have any questions about your discharge medications or the care you received while you were in the hospital after you are discharged, you can call the unit and asked to speak with the hospitalist on call if the hospitalist that took care of you is not  available. Once you are discharged, your primary care physician will handle any further medical issues. Please note that NO REFILLS for any discharge medications will be authorized once you are discharged, as it is imperative that you return to your primary care physician (or establish a relationship with a primary care physician if you do not have one) for your aftercare needs so that they can reassess your need for medications and monitor your lab values. Today   SUBJECTIVE   No new complaints blood pressure 170/80 earlier after meds improved to 137/74 no chest pain. No palpitation.  VITAL SIGNS:  Blood pressure 137/74, pulse 67, temperature 98.9 F (37.2 C), temperature source Oral, resp. rate 16, height 5\' 6"  (1.676 m), weight 58.6 kg, last menstrual period 11/19/1988, SpO2 96 %.  I/O:    Intake/Output Summary (Last 24 hours) at 08/10/2020 1110 Last data filed at 08/10/2020 0948 Gross per 24 hour  Intake 660.12 ml  Output 1000 ml  Net -339.88 ml    PHYSICAL EXAMINATION:  GENERAL:  64 y.o.-year-old patient lying in the bed with no acute distress.  HEENT: Head atraumatic, normocephalic. Oropharynx and nasopharynx clear. Right facial/cheek swelling-- known NECK:  Supple, no jugular venous distention. No thyroid enlargement, no tenderness.  LUNGS: Normal breath sounds bilaterally, no wheezing, rales,rhonchi or crepitation. No use of accessory muscles of respiration.  CARDIOVASCULAR: S1, S2 normal. No murmurs, rubs, or gallops.  ABDOMEN: Soft, non-tender, non-distended. Bowel sounds present. No organomegaly or mass.  EXTREMITIES: No pedal edema, cyanosis, or clubbing.  NEUROLOGIC: Cranial nerves II through XII are intact. Muscle strength 5/5 in all extremities. Sensation intact. Gait not checked.  PSYCHIATRIC: The patient is alert and oriented x 3.  SKIN: No obvious rash, lesion, or ulcer.   DATA REVIEW:   CBC  Recent Labs  Lab 08/10/20 0500  WBC 21.4*  HGB 12.0  HCT 35.3*   PLT 396    Chemistries  Recent Labs  Lab 08/08/20 0821 08/08/20 0821 08/09/20 0421  NA 133*   < > 132*  K 2.9*   < > 4.0  CL 93*   < > 97*  CO2 29   < > 27  GLUCOSE 117*   < > 107*  BUN 10   < > 11  CREATININE <0.30*   < > 0.39*  CALCIUM 8.7*   < > 8.6*  MG 2.1  --   --   AST 12*  --   --   ALT 11  --   --   ALKPHOS 101  --   --   BILITOT 0.8  --   --    < > = values in this interval not displayed.  Microbiology Results   Recent Results (from the past 240 hour(s))  SARS CORONAVIRUS 2 (TAT 6-24 HRS) Nasopharyngeal Nasopharyngeal Swab     Status: None   Collection Time: 08/01/20 10:18 AM   Specimen: Nasopharyngeal Swab  Result Value Ref Range Status   SARS Coronavirus 2 NEGATIVE NEGATIVE Final    Comment: (NOTE) SARS-CoV-2 target nucleic acids are NOT DETECTED.  The SARS-CoV-2 RNA is generally detectable in upper and lower respiratory specimens during the acute phase of infection. Negative results do not preclude SARS-CoV-2 infection, do not rule out co-infections with other pathogens, and should not be used as the sole basis for treatment or other patient management decisions. Negative results must be combined with clinical observations, patient history, and epidemiological information. The expected result is Negative.  Fact Sheet for Patients: SugarRoll.be  Fact Sheet for Healthcare Providers: https://www.woods-mathews.com/  This test is not yet approved or cleared by the [place] FDA and  has been authorized for detection and/or diagnosis of SARS-CoV-2 by FDA under an Emergency Use Authorization (EUA). This EUA will remain  in effect (meaning this test can be used) for the duration of the COVID-19 declaration under Se ction 564(b)(1) of the Act, 21 U.S.C. section 360bbb-3(b)(1), unless the authorization is terminated or revoked sooner.  Performed at [facility], [place] [address] [place],  [place] [address]   Blood culture (routine x 2)     Status: None (Preliminary result)   Collection Time: 08/08/20 10:16 AM   Specimen: BLOOD  Result Value Ref Range Status   Specimen Description BLOOD LEFT FOREARM  Final   Special Requests   Final    BOTTLES DRAWN AEROBIC AND ANAEROBIC Blood Culture adequate volume   Culture   Final    NO GROWTH < 24 HOURS Performed at [facility], [address] [place], [place] [address]    Report Status PENDING  Incomplete  Blood culture (routine x 2)     Status: None (Preliminary result)   Collection Time: 08/08/20 10:16 AM   Specimen: BLOOD  Result Value Ref Range Status   Specimen Description BLOOD LEFT ASSIST CONTROL  Final   Special Requests   Final    BOTTLES DRAWN AEROBIC AND ANAEROBIC Blood Culture results may not be optimal due to an inadequate volume of blood received in culture bottles   Culture   Final    NO GROWTH < 24 HOURS Performed at [facility], [address] [place], [place] [address]    Report Status PENDING  Incomplete  Respiratory Panel by RT PCR (Flu A&B, Covid) - Nasopharyngeal Swab     Status: None   Collection Time: 08/08/20  1:38 PM   Specimen: Nasopharyngeal Swab  Result Value Ref Range Status   SARS Coronavirus 2 by RT PCR NEGATIVE NEGATIVE Final    Comment: (NOTE) SARS-CoV-2 target nucleic acids are NOT DETECTED.  The SARS-CoV-2 RNA is generally detectable in upper respiratoy specimens during the acute phase of infection. The lowest concentration of SARS-CoV-2 viral copies this assay can detect is 131 copies/mL. A negative result does not preclude SARS-Cov-2 infection and should not be used as the sole basis for treatment or other patient management decisions. A negative result may occur with  improper specimen collection/handling, submission of specimen other than nasopharyngeal swab, presence of viral mutation(s) within the areas targeted by this assay, and inadequate number of viral  copies (<131 copies/mL). A negative result must be combined with clinical observations, patient history, and epidemiological  information. The expected result is Negative.  Fact Sheet for Patients:  PinkCheek.be  Fact Sheet for Healthcare Providers:  GravelBags.it  This test is no t yet approved or cleared by the [place] FDA and  has been authorized for detection and/or diagnosis of SARS-CoV-2 by FDA under an Emergency Use Authorization (EUA). This EUA will remain  in effect (meaning this test can be used) for the duration of the COVID-19 declaration under Section 564(b)(1) of the Act, 21 U.S.C. section 360bbb-3(b)(1), unless the authorization is terminated or revoked sooner.     Influenza A by PCR NEGATIVE NEGATIVE Final   Influenza B by PCR NEGATIVE NEGATIVE Final    Comment: (NOTE) The Xpert Xpress SARS-CoV-2/FLU/RSV assay is intended as an aid in  the diagnosis of influenza from Nasopharyngeal swab specimens and  should not be used as a sole basis for treatment. Nasal washings and  aspirates are unacceptable for Xpert Xpress SARS-CoV-2/FLU/RSV  testing.  Fact Sheet for Patients: PinkCheek.be  Fact Sheet for Healthcare Providers: GravelBags.it  This test is not yet approved or cleared by the [place] FDA and  has been authorized for detection and/or diagnosis of SARS-CoV-2 by  FDA under an Emergency Use Authorization (EUA). This EUA will remain  in effect (meaning this test can be used) for the duration of the  Covid-19 declaration under Section 564(b)(1) of the Act, 21  U.S.C. section 360bbb-3(b)(1), unless the authorization is  terminated or revoked. Performed at [facility], [address] [place], [place] [address]     RADIOLOGY:  CT ANGIO CHEST PE W OR WO CONTRAST  Result Date: 08/08/2020 CLINICAL DATA:  History of head and  neck cancer with hypotension and shortness of breath EXAM: CT ANGIOGRAPHY CHEST WITH CONTRAST TECHNIQUE: Multidetector CT imaging of the chest was performed using the standard protocol during bolus administration of intravenous contrast. Multiplanar CT image reconstructions and MIPs were obtained to evaluate the vascular anatomy. CONTRAST:  55mL OMNIPAQUE IOHEXOL 350 MG/ML SOLN COMPARISON:  Chest x-ray from earlier in the same day. FINDINGS: Cardiovascular: Thoracic aorta demonstrates atherosclerotic calcifications without aneurysmal dilatation or dissection. No cardiac enlargement is seen. No significant coronary calcifications are noted. The pulmonary artery shows a normal branching pattern. No focal filling defect to suggest pulmonary embolism is seen. Mediastinum/Nodes: Thoracic inlet is within normal limits. Left chest wall port is seen. Needle is noted within the port. No significant hilar or mediastinal adenopathy is noted. The esophagus as visualized is within normal limits. Lungs/Pleura: The lungs are well aerated. No focal infiltrate or sizable effusion is seen. No pneumothorax or sizable parenchymal nodules are noted. Upper Abdomen: Visualized upper abdomen shows no acute abnormality. Musculoskeletal: Degenerative changes of the thoracic spine are noted. No acute rib abnormality is seen. Review of the MIP images confirms the above findings. IMPRESSION: No evidence of pulmonary emboli. No other focal abnormality is seen. Aortic Atherosclerosis (ICD10-I70.0). Electronically Signed   By: Inez Catalina M.D.   On: 08/08/2020 17:59   US Venous Img Lower Bilateral (DVT)  Result Date: 08/08/2020 CLINICAL DATA:  Positive D-dimer. EXAM: BILATERAL LOWER EXTREMITY VENOUS DOPPLER ULTRASOUND TECHNIQUE: Gray-scale sonography with compression, as well as color and duplex ultrasound, were performed to evaluate the deep venous system(s) from the level of the common femoral vein through the popliteal and proximal calf  veins. COMPARISON:  None. FINDINGS: VENOUS Normal compressibility of the common femoral, superficial femoral, and popliteal veins, as well as the visualized calf veins. Visualized portions of profunda femoral  vein and great saphenous vein unremarkable. No filling defects to suggest DVT on grayscale or color Doppler imaging. Doppler waveforms show normal direction of venous flow, normal respiratory plasticity and response to augmentation. OTHER None. Limitations: none IMPRESSION: Negative. Electronically Signed   By: Constance Holster M.D.   On: 08/08/2020 18:27   ECHOCARDIOGRAM COMPLETE  Result Date: 08/09/2020    ECHOCARDIOGRAM REPORT   Patient Name:   Shannon Schroeder Date of Exam: 08/09/2020 Medical Rec #:  [id number]           Height:       66.0 in Accession #:    [id number]          Weight:       135.0 lb Date of Birth:  [birth year]            BSA:          1.692 m Patient Age:    64 years            BP:           178/86 mmHg Patient Gender: F                   HR:           60 bpm. Exam Location:  [facility] Procedure: 2D Echo, Color Doppler and Cardiac Doppler Indications:     Atrial Fibrillation 427.31  History:         Patient has prior history of Echocardiogram examinations, most                  recent 04/07/2019. COPD; Risk Factors:Hypertension. Bipolar                  disorder.  Sonographer:     Sherrie Sport RDCS (AE) Referring Phys:  [place] Diagnosing Phys: Ida Rogue MD IMPRESSIONS  1. Left ventricular ejection fraction, by estimation, is 60 to 65%. The left ventricle has normal function. The left ventricle has no regional wall motion abnormalities. There is mild left ventricular hypertrophy. Left ventricular diastolic parameters are consistent with Grade II diastolic dysfunction (pseudonormalization).  2. Right ventricular systolic function is normal. The right ventricular size is normal. There is normal pulmonary artery systolic pressure. The estimated right ventricular systolic  pressure is 32.4 mmHg.  3. The mitral valve is normal in structure. Mild mitral valve regurgitation. FINDINGS  Left Ventricle: Left ventricular ejection fraction, by estimation, is 60 to 65%. The left ventricle has normal function. The left ventricle has no regional wall motion abnormalities. The left ventricular internal cavity size was normal in size. There is  mild left ventricular hypertrophy. Left ventricular diastolic parameters are consistent with Grade II diastolic dysfunction (pseudonormalization). Right Ventricle: The right ventricular size is normal. No increase in right ventricular wall thickness. Right ventricular systolic function is normal. There is normal pulmonary artery systolic pressure. The tricuspid regurgitant velocity is 2.02 m/s, and  with an assumed right atrial pressure of 5 mmHg, the estimated right ventricular systolic pressure is 40.1 mmHg. Left Atrium: Left atrial size was normal in size. Right Atrium: Right atrial size was normal in size. Pericardium: There is no evidence of pericardial effusion. Mitral Valve: The mitral valve is normal in structure. Mild mitral valve regurgitation. No evidence of mitral valve stenosis. Tricuspid Valve: The tricuspid valve is normal in structure. Tricuspid valve regurgitation is mild . No evidence of tricuspid stenosis. Aortic Valve: The aortic valve was not well visualized. Aortic valve  regurgitation is trivial. No aortic stenosis is present. Aortic valve mean gradient measures 3.5 mmHg. Aortic valve peak gradient measures 6.4 mmHg. Aortic valve area, by VTI measures 3.73 cm. Pulmonic Valve: The pulmonic valve was normal in structure. Pulmonic valve regurgitation is not visualized. No evidence of pulmonic stenosis. Aorta: The aortic root is normal in size and structure. Venous: The inferior vena cava is normal in size with greater than 50% respiratory variability, suggesting right atrial pressure of 3 mmHg. IAS/Shunts: No atrial level shunt detected  by color flow Doppler.  LEFT VENTRICLE PLAX 2D LVIDd:         4.07 cm  Diastology LVIDs:         2.71 cm  LV e' medial:    5.77 cm/s LV PW:         1.29 cm  LV E/e' medial:  13.8 LV IVS:        0.88 cm  LV e' lateral:   9.14 cm/s LVOT diam:     2.10 cm  LV E/e' lateral: 8.7 LV SV:         86 LV SV Index:   51 LVOT Area:     3.46 cm  RIGHT VENTRICLE RV Basal diam:  3.75 cm RV S prime:     12.00 cm/s TAPSE (M-mode): 3.4 cm LEFT ATRIUM           Index       RIGHT ATRIUM           Index LA diam:      3.20 cm 1.89 cm/m  RA Area:     15.30 cm LA Vol (A2C): 27.9 ml 16.49 ml/m RA Volume:   39.50 ml  23.34 ml/m LA Vol (A4C): 38.6 ml 22.81 ml/m  AORTIC VALVE                   PULMONIC VALVE AV Area (Vmax):    2.70 cm    PV Vmax:        0.85 m/s AV Area (Vmean):   3.01 cm    PV Peak grad:   2.9 mmHg AV Area (VTI):     3.73 cm    RVOT Peak grad: 3 mmHg AV Vmax:           126.50 cm/s AV Vmean:          87.700 cm/s AV VTI:            0.230 m AV Peak Grad:      6.4 mmHg AV Mean Grad:      3.5 mmHg LVOT Vmax:         98.60 cm/s LVOT Vmean:        76.300 cm/s LVOT VTI:          0.248 m LVOT/AV VTI ratio: 1.08  AORTA Ao Root diam: 2.60 cm MITRAL VALVE               TRICUSPID VALVE MV Area (PHT): 3.63 cm    TR Peak grad:   16.3 mmHg MV Decel Time: 209 msec    TR Vmax:        202.00 cm/s MV E velocity: 79.70 cm/s MV A velocity: 71.60 cm/s  SHUNTS MV E/A ratio:  1.11        Systemic VTI:  0.25 m                            Systemic  Diam: 2.10 cm Ida Rogue MD Electronically signed by Ida Rogue MD Signature Date/Time: 08/09/2020/2:59:39 PM    Final      CODE STATUS:     Code Status Orders  (From admission, onward)         Start     Ordered   08/08/20 1406  Full code  Continuous        08/08/20 1405        Code Status History    This patient has a current code status but no historical code status.   Advance Care Planning Activity       TOTAL TIME TAKING CARE OF THIS PATIENT: 35* minutes.     Fritzi Mandes M.D  [facility]    CC: Primary care physician; Pcp, No

## 2020-08-10 NOTE — Progress Notes (Signed)
Progress Note  Patient Name: Shannon Schroeder Date of Encounter: 08/10/2020  [facility] Cardiologist: Ida Rogue, MD   Subjective   Patient says she is feeling better today, more energy. No chest pain or sob. Remains in NSR with heart rates in the 60s. BP still up. Tele shows brief episodes of SVT. WBC also up since yesterday. No fever or chills.   Inpatient Medications    Scheduled Meds: . amLODipine  10 mg Oral Daily  . apixaban  5 mg Oral BID  . B-complex with vitamin C  1 tablet Oral Daily  . chlorhexidine  15 mL Mouth Rinse BID  . Chlorhexidine Gluconate Cloth  6 each Topical Daily  . feeding supplement (ENSURE ENLIVE)  237 mL Oral TID BM  . fentaNYL  1 patch Transdermal Q72H  . gabapentin  300 mg Oral TID  . lisinopril  40 mg Oral Daily  . mouth rinse  15 mL Mouth Rinse q12n4p  . metoprolol tartrate  50 mg Oral BID  . multivitamin with minerals  1 tablet Oral Daily  . nicotine  21 mg Transdermal Daily   Continuous Infusions:  PRN Meds: acetaminophen, albuterol, dextromethorphan-guaiFENesin, influenza vaccine adjuvanted, lidocaine-prilocaine, melatonin, metoprolol tartrate, ondansetron, oxyCODONE-acetaminophen   Vital Signs    Vitals:   08/10/20 0011 08/10/20 0451 08/10/20 0600 08/10/20 0754  BP: (!) 154/74 (!) 173/79 (!) 150/80 (!) 170/80  Pulse: 79 (!) 58  69  Resp:  17  16  Temp: 98.4 F (36.9 C) 98.2 F (36.8 C)  98.9 F (37.2 C)  TempSrc: Oral Oral  Oral  SpO2: 93% 96%  96%  Weight:  58.6 kg    Height:        Intake/Output Summary (Last 24 hours) at 08/10/2020 0809 Last data filed at 08/09/2020 2200 Gross per 24 hour  Intake 660.12 ml  Output 300 ml  Net 360.12 ml   Last 3 Weights 08/10/2020 08/08/2020 07/29/2020  Weight (lbs) 129 lb 1.6 oz 135 lb 131 lb  Weight (kg) 58.559 kg 61.236 kg 59.421 kg      Telemetry    NSR, HR down from 80 to 60s now, brief episodes of Svt, longes 12 beats - Personally Reviewed  ECG    NSR 59bpm,  nonspecific T wave changes - Personally Reviewed  Physical Exam   GEN: No acute distress.   Neck: No JVD Cardiac: RRR, + murmur, no rubs, or gallops.  Respiratory: diffuse Rhonchi and wheezing  GI: Soft, nontender, non-distended  MS: No edema; No deformity. Neuro:  Nonfocal  Psych: Normal affect   Labs    High Sensitivity Troponin:   Recent Labs  Lab 08/08/20 0955 08/08/20 1338 08/08/20 1932 08/08/20 2222 08/08/20 2359  TROPONINIHS 25* 60* 103* 91* 79*      Chemistry Recent Labs  Lab 08/08/20 0821 08/09/20 0421  NA 133* 132*  K 2.9* 4.0  CL 93* 97*  CO2 29 27  GLUCOSE 117* 107*  BUN 10 11  CREATININE <0.30* 0.39*  CALCIUM 8.7* 8.6*  PROT 7.1  --   ALBUMIN 3.4*  --   AST 12*  --   ALT 11  --   ALKPHOS 101  --   BILITOT 0.8  --   GFRNONAA NOT CALCULATED >60  ANIONGAP 11 8     Hematology Recent Labs  Lab 08/08/20 0821 08/09/20 0421 08/10/20 0500  WBC 18.3* 19.2* 21.4*  RBC 3.75* 3.18* 3.55*  HGB 12.7 10.8* 12.0  HCT 36.2 31.5* 35.3*  MCV 96.5 99.1 99.4  MCH 33.9 34.0 33.8  MCHC 35.1 34.3 34.0  RDW 13.2 13.5 13.4  PLT 425* 362 396    BNP Recent Labs  Lab 08/08/20 0955  BNP 233.2*     DDimer No results for input(s): DDIMER in the last 168 hours.   Radiology    DG Chest 2 View  Result Date: 08/08/2020 CLINICAL DATA:  Tachycardia EXAM: CHEST - 2 VIEW COMPARISON:  August 05, 2020 FINDINGS: The cardiomediastinal silhouette is unchanged in contour.LEFT chest port with tip terminating over the SVC. No pleural effusion. No pneumothorax. No acute pleuroparenchymal abnormality. Visualized abdomen is unremarkable. Multilevel degenerative changes of the thoracic spine. IMPRESSION: No acute cardiopulmonary abnormality. Electronically Signed   By: Valentino Saxon MD   On: 08/08/2020 10:30   CT ANGIO CHEST PE W OR WO CONTRAST  Result Date: 08/08/2020 CLINICAL DATA:  History of head and neck cancer with hypotension and shortness of breath EXAM: CT  ANGIOGRAPHY CHEST WITH CONTRAST TECHNIQUE: Multidetector CT imaging of the chest was performed using the standard protocol during bolus administration of intravenous contrast. Multiplanar CT image reconstructions and MIPs were obtained to evaluate the vascular anatomy. CONTRAST:  73mL OMNIPAQUE IOHEXOL 350 MG/ML SOLN COMPARISON:  Chest x-ray from earlier in the same day. FINDINGS: Cardiovascular: Thoracic aorta demonstrates atherosclerotic calcifications without aneurysmal dilatation or dissection. No cardiac enlargement is seen. No significant coronary calcifications are noted. The pulmonary artery shows a normal branching pattern. No focal filling defect to suggest pulmonary embolism is seen. Mediastinum/Nodes: Thoracic inlet is within normal limits. Left chest wall port is seen. Needle is noted within the port. No significant hilar or mediastinal adenopathy is noted. The esophagus as visualized is within normal limits. Lungs/Pleura: The lungs are well aerated. No focal infiltrate or sizable effusion is seen. No pneumothorax or sizable parenchymal nodules are noted. Upper Abdomen: Visualized upper abdomen shows no acute abnormality. Musculoskeletal: Degenerative changes of the thoracic spine are noted. No acute rib abnormality is seen. Review of the MIP images confirms the above findings. IMPRESSION: No evidence of pulmonary emboli. No other focal abnormality is seen. Aortic Atherosclerosis (ICD10-I70.0). Electronically Signed   By: Inez Catalina M.D.   On: 08/08/2020 17:59   US Venous Img Lower Bilateral (DVT)  Result Date: 08/08/2020 CLINICAL DATA:  Positive D-dimer. EXAM: BILATERAL LOWER EXTREMITY VENOUS DOPPLER ULTRASOUND TECHNIQUE: Gray-scale sonography with compression, as well as color and duplex ultrasound, were performed to evaluate the deep venous system(s) from the level of the common femoral vein through the popliteal and proximal calf veins. COMPARISON:  None. FINDINGS: VENOUS Normal  compressibility of the common femoral, superficial femoral, and popliteal veins, as well as the visualized calf veins. Visualized portions of profunda femoral vein and great saphenous vein unremarkable. No filling defects to suggest DVT on grayscale or color Doppler imaging. Doppler waveforms show normal direction of venous flow, normal respiratory plasticity and response to augmentation. OTHER None. Limitations: none IMPRESSION: Negative. Electronically Signed   By: Constance Holster M.D.   On: 08/08/2020 18:27   ECHOCARDIOGRAM COMPLETE  Result Date: 08/09/2020    ECHOCARDIOGRAM REPORT   Patient Name:   Shannon Schroeder Date of Exam: 08/09/2020 Medical Rec #:  [id number]           Height:       66.0 in Accession #:    [id number]          Weight:       135.0 lb Date of Birth:  Jun 15, 1956            BSA:          1.692 m Patient Age:    79 years            BP:           178/86 mmHg Patient Gender: F                   HR:           60 bpm. Exam Location:  [facility] Procedure: 2D Echo, Color Doppler and Cardiac Doppler Indications:     Atrial Fibrillation 427.31  History:         Patient has prior history of Echocardiogram examinations, most                  recent 04/07/2019. COPD; Risk Factors:Hypertension. Bipolar                  disorder.  Sonographer:     Sherrie Sport RDCS (AE) Referring Phys:  [place] Diagnosing Phys: Ida Rogue MD IMPRESSIONS  1. Left ventricular ejection fraction, by estimation, is 60 to 65%. The left ventricle has normal function. The left ventricle has no regional wall motion abnormalities. There is mild left ventricular hypertrophy. Left ventricular diastolic parameters are consistent with Grade II diastolic dysfunction (pseudonormalization).  2. Right ventricular systolic function is normal. The right ventricular size is normal. There is normal pulmonary artery systolic pressure. The estimated right ventricular systolic pressure is 37.6 mmHg.  3. The mitral valve is normal in  structure. Mild mitral valve regurgitation. FINDINGS  Left Ventricle: Left ventricular ejection fraction, by estimation, is 60 to 65%. The left ventricle has normal function. The left ventricle has no regional wall motion abnormalities. The left ventricular internal cavity size was normal in size. There is  mild left ventricular hypertrophy. Left ventricular diastolic parameters are consistent with Grade II diastolic dysfunction (pseudonormalization). Right Ventricle: The right ventricular size is normal. No increase in right ventricular wall thickness. Right ventricular systolic function is normal. There is normal pulmonary artery systolic pressure. The tricuspid regurgitant velocity is 2.02 m/s, and  with an assumed right atrial pressure of 5 mmHg, the estimated right ventricular systolic pressure is 28.3 mmHg. Left Atrium: Left atrial size was normal in size. Right Atrium: Right atrial size was normal in size. Pericardium: There is no evidence of pericardial effusion. Mitral Valve: The mitral valve is normal in structure. Mild mitral valve regurgitation. No evidence of mitral valve stenosis. Tricuspid Valve: The tricuspid valve is normal in structure. Tricuspid valve regurgitation is mild . No evidence of tricuspid stenosis. Aortic Valve: The aortic valve was not well visualized. Aortic valve regurgitation is trivial. No aortic stenosis is present. Aortic valve mean gradient measures 3.5 mmHg. Aortic valve peak gradient measures 6.4 mmHg. Aortic valve area, by VTI measures 3.73 cm. Pulmonic Valve: The pulmonic valve was normal in structure. Pulmonic valve regurgitation is not visualized. No evidence of pulmonic stenosis. Aorta: The aortic root is normal in size and structure. Venous: The inferior vena cava is normal in size with greater than 50% respiratory variability, suggesting right atrial pressure of 3 mmHg. IAS/Shunts: No atrial level shunt detected by color flow Doppler.  LEFT VENTRICLE PLAX 2D LVIDd:          4.07 cm  Diastology LVIDs:         2.71 cm  LV e' medial:    5.77 cm/s  LV PW:         1.29 cm  LV E/e' medial:  13.8 LV IVS:        0.88 cm  LV e' lateral:   9.14 cm/s LVOT diam:     2.10 cm  LV E/e' lateral: 8.7 LV SV:         86 LV SV Index:   51 LVOT Area:     3.46 cm  RIGHT VENTRICLE RV Basal diam:  3.75 cm RV S prime:     12.00 cm/s TAPSE (M-mode): 3.4 cm LEFT ATRIUM           Index       RIGHT ATRIUM           Index LA diam:      3.20 cm 1.89 cm/m  RA Area:     15.30 cm LA Vol (A2C): 27.9 ml 16.49 ml/m RA Volume:   39.50 ml  23.34 ml/m LA Vol (A4C): 38.6 ml 22.81 ml/m  AORTIC VALVE                   PULMONIC VALVE AV Area (Vmax):    2.70 cm    PV Vmax:        0.85 m/s AV Area (Vmean):   3.01 cm    PV Peak grad:   2.9 mmHg AV Area (VTI):     3.73 cm    RVOT Peak grad: 3 mmHg AV Vmax:           126.50 cm/s AV Vmean:          87.700 cm/s AV VTI:            0.230 m AV Peak Grad:      6.4 mmHg AV Mean Grad:      3.5 mmHg LVOT Vmax:         98.60 cm/s LVOT Vmean:        76.300 cm/s LVOT VTI:          0.248 m LVOT/AV VTI ratio: 1.08  AORTA Ao Root diam: 2.60 cm MITRAL VALVE               TRICUSPID VALVE MV Area (PHT): 3.63 cm    TR Peak grad:   16.3 mmHg MV Decel Time: 209 msec    TR Vmax:        202.00 cm/s MV E velocity: 79.70 cm/s MV A velocity: 71.60 cm/s  SHUNTS MV E/A ratio:  1.11        Systemic VTI:  0.25 m                            Systemic Diam: 2.10 cm Ida Rogue MD Electronically signed by Ida Rogue MD Signature Date/Time: 08/09/2020/2:59:39 PM    Final     Cardiac Studies   Echo 08/09/20 1. Left ventricular ejection fraction, by estimation, is 60 to 65%. The  left ventricle has normal function. The left ventricle has no regional  wall motion abnormalities. There is mild left ventricular hypertrophy.  Left ventricular diastolic parameters  are consistent with Grade II diastolic dysfunction (pseudonormalization).  2. Right ventricular systolic function is normal. The  right ventricular  size is normal. There is normal pulmonary artery systolic pressure. The  estimated right ventricular systolic pressure is 76.1 mmHg.  3. The mitral valve is normal in structure. Mild mitral valve  regurgitation.   Patient Profile  64 y.o. female with pmh of  COPD, bipolar disorder, HTN, invasive aquamous cell carcinoma/maxiallry sinus cancer recently started on radiation tx and being set up for chemotherapy who presented to oncology and was found to be in afib RVR.  Assessment & Plan    Persistent Afib - Unclear onset given patient had been experiencing fatigue for 5 daus PTA - Noted to be tachycardic on oncology office and reffered to ED where Afib RVR was found - She was started on IV dilt and converted to NSR. dilt was held for Hr in the 50s - Now on metoprolol 50mg  BID. Heart rates in the 60s - Echo showed LVEF 60-65%, G2DD, mild LVH, mild MR - IV heparin>>ELiquis with CHADSVASC of at least 3. Assistance paperwork filled out  Elevated troponin/demand ischemia - Hs trop elevated to 103 and now down trending int he setting of Afib RVR - Patient denies chest pain - Echo showed LVEF 60-65%, G2DD, mild LVH, mild MR - any further ischemic work-up can be as outpatient  HTN - Metoprolol 50mg  BID - Home lisinopril 40 mg daily added - BP today elevated, 170/80 - Cannot likely titrate BB due to low heart rates.  -  amlodipine 10 started yesterday - Will add hydralazine 25mg  BID  Squamous cell carcinoma - followed by oncology  Hypokalemia - resolved  Anemia - mildly decrease H/H. Follow with initiation of Eliquis  Leukocytosis - Wbc continues to rise - Unclear infectious etiology - further work-up per IM  SVT - brief episodes seen on telemetry  - already on BB, unsure we can titrate due to low heart rates    For questions or updates, please contact [facility] Please consult [email or web address] for contact info under        Signed, Landy Dunnavant Ninfa Meeker, PA-C  08/10/2020, 8:09 AM

## 2020-08-10 NOTE — Progress Notes (Addendum)
CCMD called and reported that pt just have a a non sustain SVT HR at 154. ON assessment pt was asleep with no signs of distress. Per pt report she just use the bathroom and just got back in the bed. Notify NP Randol Kern. Will continue to monitor.  Update 0100: Talked to NP Randol Kern no new order was placed but to monitor pt. Will continue to monitor.

## 2020-08-12 ENCOUNTER — Encounter: Payer: Self-pay | Admitting: Oncology

## 2020-08-12 ENCOUNTER — Other Ambulatory Visit: Payer: Self-pay

## 2020-08-12 ENCOUNTER — Ambulatory Visit
Admission: RE | Admit: 2020-08-12 | Discharge: 2020-08-12 | Disposition: A | Discharge status: Home / Self Care | Payer: Medicaid Other | Source: Ambulatory Visit | Attending: Radiation Oncology | Admitting: Radiation Oncology

## 2020-08-12 ENCOUNTER — Inpatient Hospital Stay: Payer: Medicaid Other

## 2020-08-12 ENCOUNTER — Ambulatory Visit: Payer: Medicaid Other

## 2020-08-12 ENCOUNTER — Inpatient Hospital Stay (HOSPITAL_BASED_OUTPATIENT_CLINIC_OR_DEPARTMENT_OTHER): Payer: Medicaid Other | Admitting: Oncology

## 2020-08-12 VITALS — BP 92/59 | HR 84 | Temp 99.2°F | Resp 16 | Ht 66.0 in | Wt 124.5 lb

## 2020-08-12 DIAGNOSIS — Z5111 Encounter for antineoplastic chemotherapy: Secondary | ICD-10-CM

## 2020-08-12 DIAGNOSIS — Z510 Encounter for antineoplastic radiation therapy: Secondary | ICD-10-CM | POA: Diagnosis not present

## 2020-08-12 DIAGNOSIS — R634 Abnormal weight loss: Secondary | ICD-10-CM

## 2020-08-12 DIAGNOSIS — C310 Malignant neoplasm of maxillary sinus: Secondary | ICD-10-CM

## 2020-08-12 DIAGNOSIS — I959 Hypotension, unspecified: Secondary | ICD-10-CM

## 2020-08-12 DIAGNOSIS — E871 Hypo-osmolality and hyponatremia: Secondary | ICD-10-CM

## 2020-08-12 DIAGNOSIS — G893 Neoplasm related pain (acute) (chronic): Secondary | ICD-10-CM

## 2020-08-12 DIAGNOSIS — C760 Malignant neoplasm of head, face and neck: Secondary | ICD-10-CM

## 2020-08-12 DIAGNOSIS — Z8679 Personal history of other diseases of the circulatory system: Secondary | ICD-10-CM

## 2020-08-12 LAB — CBC WITH DIFFERENTIAL/PLATELET
Abs Immature Granulocytes: 0.13 K/uL — ABNORMAL HIGH (ref 0.00–0.07)
Basophils Absolute: 0.1 K/uL (ref 0.0–0.1)
Basophils Relative: 1 %
Eosinophils Absolute: 0.2 K/uL (ref 0.0–0.5)
Eosinophils Relative: 1 %
HCT: 33.1 % — ABNORMAL LOW (ref 36.0–46.0)
Hemoglobin: 11.4 g/dL — ABNORMAL LOW (ref 12.0–15.0)
Immature Granulocytes: 1 %
Lymphocytes Relative: 4 %
Lymphs Abs: 0.8 K/uL (ref 0.7–4.0)
MCH: 33.2 pg (ref 26.0–34.0)
MCHC: 34.4 g/dL (ref 30.0–36.0)
MCV: 96.5 fL (ref 80.0–100.0)
Monocytes Absolute: 2.6 K/uL — ABNORMAL HIGH (ref 0.1–1.0)
Monocytes Relative: 12 %
Neutro Abs: 18.1 K/uL — ABNORMAL HIGH (ref 1.7–7.7)
Neutrophils Relative %: 81 %
Platelets: 399 K/uL (ref 150–400)
RBC: 3.43 MIL/uL — ABNORMAL LOW (ref 3.87–5.11)
RDW: 13.4 % (ref 11.5–15.5)
WBC: 21.9 K/uL — ABNORMAL HIGH (ref 4.0–10.5)
nRBC: 0 % (ref 0.0–0.2)

## 2020-08-12 LAB — COMPREHENSIVE METABOLIC PANEL
ALT: 13 U/L (ref 0–44)
AST: 16 U/L (ref 15–41)
Albumin: 3.1 g/dL — ABNORMAL LOW (ref 3.5–5.0)
Alkaline Phosphatase: 93 U/L (ref 38–126)
Anion gap: 9 (ref 5–15)
BUN: 22 mg/dL (ref 8–23)
CO2: 28 mmol/L (ref 22–32)
Calcium: 8.3 mg/dL — ABNORMAL LOW (ref 8.9–10.3)
Chloride: 93 mmol/L — ABNORMAL LOW (ref 98–111)
Creatinine, Ser: 0.38 mg/dL — ABNORMAL LOW (ref 0.44–1.00)
GFR, Estimated: >60 mL/min (ref >60)
Glucose, Bld: 120 mg/dL — ABNORMAL HIGH (ref 70–99)
Potassium: 3.6 mmol/L (ref 3.5–5.1)
Sodium: 130 mmol/L — ABNORMAL LOW (ref 135–145)
Total Bilirubin: 0.3 mg/dL (ref 0.3–1.2)
Total Protein: 6.8 g/dL (ref 6.5–8.1)

## 2020-08-12 LAB — MAGNESIUM: Magnesium: 2 mg/dL (ref 1.7–2.4)

## 2020-08-12 MED ORDER — SODIUM CHLORIDE 0.9 % IV SOLN
40.0000 mg/m2 | Freq: Once | INTRAVENOUS | Status: AC
  Administered 2020-08-12: 66 mg via INTRAVENOUS
  Filled 2020-08-12: qty 66

## 2020-08-12 MED ORDER — HEPARIN SOD (PORK) LOCK FLUSH 100 UNIT/ML IV SOLN
500.0000 [IU] | Freq: Once | INTRAVENOUS | Status: AC
  Administered 2020-08-12: 500 [IU] via INTRAVENOUS
  Filled 2020-08-12: qty 5

## 2020-08-12 MED ORDER — HEPARIN SOD (PORK) LOCK FLUSH 100 UNIT/ML IV SOLN
INTRAVENOUS | Status: AC
  Filled 2020-08-12: qty 5

## 2020-08-12 MED ORDER — SODIUM CHLORIDE 0.9% FLUSH
10.0000 mL | INTRAVENOUS | Status: DC | PRN
  Administered 2020-08-12: 10 mL via INTRAVENOUS
  Filled 2020-08-12: qty 10

## 2020-08-12 MED ORDER — HEPARIN SOD (PORK) LOCK FLUSH 100 UNIT/ML IV SOLN
500.0000 [IU] | Freq: Once | INTRAVENOUS | Status: DC | PRN
  Filled 2020-08-12: qty 5

## 2020-08-12 MED ORDER — SODIUM CHLORIDE 0.9 % IV SOLN
Freq: Once | INTRAVENOUS | Status: AC
  Filled 2020-08-12: qty 1000

## 2020-08-12 MED ORDER — SODIUM CHLORIDE 0.9 % IV SOLN
150.0000 mg | Freq: Once | INTRAVENOUS | Status: AC
  Administered 2020-08-12: 150 mg via INTRAVENOUS
  Filled 2020-08-12: qty 5

## 2020-08-12 MED ORDER — SODIUM CHLORIDE 0.9 % IV SOLN
Freq: Once | INTRAVENOUS | Status: AC
  Filled 2020-08-12: qty 250

## 2020-08-12 MED ORDER — OXYCODONE-ACETAMINOPHEN 5-325 MG PO TABS
1.0000 | ORAL_TABLET | ORAL | 0 refills | Status: DC | PRN
Start: 2020-08-12 — End: 2020-09-16

## 2020-08-12 MED ORDER — SODIUM CHLORIDE 0.9 % IV SOLN
10.0000 mg | Freq: Once | INTRAVENOUS | Status: AC
  Administered 2020-08-12: 10 mg via INTRAVENOUS
  Filled 2020-08-12: qty 10

## 2020-08-12 MED ORDER — PALONOSETRON HCL INJECTION 0.25 MG/5ML
0.2500 mg | Freq: Once | INTRAVENOUS | Status: AC
  Administered 2020-08-12: 0.25 mg via INTRAVENOUS
  Filled 2020-08-12: qty 5

## 2020-08-12 NOTE — Progress Notes (Signed)
Hematology/Oncology follow up note [facility] [phone] [phone]   Patient Care Team: Pcp, No as PCP - General Rockey Situ, Kathlene November, MD as PCP - Cardiology (Cardiology) Earlie Server, MD as Consulting Physician (Oncology)  CHIEF COMPLAINTS/REASON FOR VISIT:  Follow-up for head and neck cancer  HISTORY OF PRESENTING ILLNESS:   Shannon Schroeder is a  64 y.o.  female with PMH listed below was seen in consultation at the request of ER Dr. [name] for evaluation of abnormal CT scan.  07/10/2020 she presented emergency room for evaluation of right-sided facial swelling and pain in the right side of her mouth for the past 6 weeks.  It started in her mouth with a small ulcer which progressively got worse.  She wears denture.  Not able to chew well.  She eats soft food. She was found to have right upper gum also with yellowish discharge noted. 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  Avoid piloting.,  Right orbital apex, also the right soft palate and palate and tonsil.  Furthermore there is evidence of intracranial extension along the right V2 at the foramen rotundum Small suspicious right level 1A lymph node measuring 7 mm.  Separate small indeterminate but suspicious enhancing soft tissue nodule of the left sublingual space.  Patient was referred to oncology for further evaluation management. Patient denies fever, chills, nausea vomiting diarrhea shortness of breath or cough.  Reports 10 out of 10 severe pain of right face and numbness.  She also has headache. Reports unintentional weight loss  She reports no contactable family members.  She is in the process of pointing her roommate [name] to be her power of attorney.  She also wants to record today's conversation for her roommate  [name]. Patient has 50-pack-year smoking history.  # seen by ENT Dr.Juengel and had a biopsy. Biopsy was sent to [facility] and the final pathology was positive for invasive squamous cell carcinoma, well-differentiated, keratinizing. 07/17/2020, brain MRI showed large right facial mass with right maxillary erosion involving the maxillary sinus, hard palate and pterygoid plates and muscles.  Perineural spreading along the multiple branches of the maxillary division of the right trigeminal nerve at the infraorbital canal, pterygopalatine fossa and foramen rotundum. 07/17/2020, PET scan showed Intense FDG uptake is associated with the large enhancing tumor which involves the right maxilla, right nasal cavity, right pterygoid palatine fossa, right orbital apex and right soft tissue palate and palatine tonsil. 2. Mild nonspecific FDG uptake is associated with the recently characterized suspicious right level 1A lymph node which measures 7 mm and has an SUV max of 2.12.  No signs of distant metastasis.  Aortic atherosclerosis.  Patient also reports a history of hypertension, dysrhythmia, history of V. tach.  Patient previously was on lisinopril, atenolol, amlodipine for which she is no longer taking at this point due to lack of medication coverage.  She also does not have any primary care provider. 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  On average she says she is takes 2 to 3 pills/day.  She also takes gabapentin 300 mg 3 times daily   INTERVAL HISTORY LAKIYAH Schroeder is a 64  y.o. female who has above history reviewed by me today presents for follow up visit for management of head and neck cancer. Problems and complaints are listed below: Recent admission due to new onset of atrial fibrillation with rapid ventricular response.   Patient was started on metoprolol.  Rate has converted to sinus tachycardia spontaneously.  Patient was also started on heparin drip and switched to Eliquis per cardiology recommendation. Today patient reports feeling better.  Right facial pain is controlled with Percocet and fentanyl patch.  She requests Percocet refill.   Review of Systems  Constitutional: Positive for fatigue and unexpected weight change. Negative for appetite change, chills and fever.  HENT:   Negative for hearing loss, mouth sores and voice change.        Facial swelling, pain and numbness  Eyes: Negative for eye problems.  Respiratory: Negative for chest tightness and cough.   Cardiovascular: Negative for chest pain and palpitations.  Gastrointestinal: Negative for abdominal distention, abdominal pain, blood in stool, nausea and vomiting.  Endocrine: Negative for hot flashes.  Genitourinary: Negative for difficulty urinating and frequency.   Musculoskeletal: Negative for arthralgias.  Skin: Negative for itching and rash.  Neurological: Negative for extremity weakness and headaches.  Hematological: Negative for adenopathy.  Psychiatric/Behavioral: Negative for confusion.    MEDICAL HISTORY:  Past Medical History:  Diagnosis Date  . 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   Marland Kitchen Anxiety   . Arthritis   . Bipolar disorder ([place])   . COPD (chronic obstructive pulmonary disease) ([place])    NO INHALERS  . Depression   . Dysrhythmia    H/O V TACH  . Head and neck cancer ([place]) 07/29/2020  . Headache    H/O MIGRAINES  . Hypertension   . Seizures ([place])    X1 AFTER FALL    SURGICAL HISTORY: Past Surgical History:  Procedure Laterality Date  . ABDOMINAL HYSTERECTOMY     partial  . BREAST SURGERY     biopsy  . CARDIAC CATHETERIZATION     X 2  . CARPAL TUNNEL RELEASE Right   . CARPECTOMY HAND Right   . CATARACT EXTRACTION W/PHACO Left 03/08/2018   Procedure: CATARACT  EXTRACTION PHACO AND INTRAOCULAR LENS PLACEMENT (IOC);  Surgeon: Birder Robson, MD;  Location: [facility] ORS;  Service: Ophthalmology;  Laterality: Left;  [place] 00:55 AP% 13.5 CDE 7.52 Fluid pack lot # [id number]  . CATARACT EXTRACTION W/PHACO Right 04/05/2018   Procedure: CATARACT EXTRACTION PHACO AND INTRAOCULAR LENS PLACEMENT (IOC);  Surgeon: Birder Robson, MD;  Location: [facility] ORS;  Service: Ophthalmology;  Laterality: Right;  [place] 00:36 AP% 15.9 CDE 5.72 Fluid pack lot # [id number]  . CEREBRAL ANEURYSM REPAIR    . CHOLECYSTECTOMY    . COLONOSCOPY WITH PROPOFOL N/A 04/26/2019   Procedure: COLONOSCOPY WITH PROPOFOL;  Surgeon: Jonathon Bellows, MD;  Location: [facility] ENDOSCOPY;  Service: Gastroenterology;  Laterality: N/A;  . PORTACATH PLACEMENT Left 08/05/2020   Procedure: INSERTION PORT-A-CATH;  Surgeon: Herbert Pun, MD;  Location: [facility] ORS;  Service: General;  Laterality: Left;  . TUBAL LIGATION      SOCIAL HISTORY: Social History   Socioeconomic History  . Marital status: Divorced    Spouse name: Not on file  . Number of children: Not on file  . Years of education: 50  . Highest education level: Bachelor's degree (e.g., BA, AB, BS)  Occupational History  . Occupation: farmer  Tobacco Use  . Smoking status: Current Every  Day Smoker    Packs/day: 1.00    Years: 50.00    Pack years: 50.00    Types: Cigarettes  . Smokeless tobacco: Never Used  Vaping Use  . Vaping Use: Never used  Substance and Sexual Activity  . Alcohol use: No  . Drug use: No  . Sexual activity: Not Currently  Other Topics Concern  . Not on file  Social History Narrative   Pt lives at [facility].    Social Determinants of Health   Financial Resource Strain:   . Difficulty of Paying Living Expenses: Not on file  Food Insecurity:   . Worried About Charity fundraiser in the Last Year: Not on file  . Ran Out of Food in the Last Year: Not on file  Transportation Needs:   . Lack of Transportation  (Medical): Not on file  . Lack of Transportation (Non-Medical): Not on file  Physical Activity:   . Days of Exercise per Week: Not on file  . Minutes of Exercise per Session: Not on file  Stress:   . Feeling of Stress : Not on file  Social Connections:   . Frequency of Communication with Friends and Family: Not on file  . Frequency of Social Gatherings with Friends and Family: Not on file  . Attends Religious Services: Not on file  . Active Member of Clubs or Organizations: Not on file  . Attends Archivist Meetings: Not on file  . Marital Status: Not on file  Intimate Partner Violence:   . Fear of Current or Ex-Partner: Not on file  . Emotionally Abused: Not on file  . Physically Abused: Not on file  . Sexually Abused: Not on file    FAMILY HISTORY: Family History  Problem Relation Age of Onset  . Hypertension Mother   . Heart failure Mother   . Hypertension Brother   . Stroke Brother   . Hypertension Son   . Emphysema Maternal Aunt   . Hypertension Paternal Aunt   . Hypertension Paternal Uncle   . Hypertension Maternal Grandmother   . Hypertension Paternal Grandmother     ALLERGIES:  is allergic to aspirin, belladonna alkaloids, fluoxetine hcl, naproxen, phenobarbital, and prozac [fluoxetine hcl].  MEDICATIONS:  Current Outpatient Medications  Medication Sig Dispense Refill  . acetaminophen (TYLENOL 8 HOUR ARTHRITIS PAIN) 650 MG CR tablet Take 1,300 mg by mouth daily as needed (Pain).     Marland Kitchen amLODipine (NORVASC) 10 MG tablet Take 1 tablet (10 mg total) by mouth daily. 30 tablet 1  . apixaban (ELIQUIS) 5 MG TABS tablet Take 1 tablet (5 mg total) by mouth 2 (two) times daily. 60 tablet 0  . b complex vitamins capsule Take 1 capsule by mouth daily. Gummie    . feeding supplement, ENSURE ENLIVE, (ENSURE ENLIVE) LIQD Take 237 mLs by mouth 3 (three) times daily between meals. 237 mL 12  . fentaNYL (DURAGESIC) 25 MCG/HR Place 1 patch onto the skin every 3 (three)  days. 5 patch 0  . gabapentin (NEURONTIN) 300 MG capsule Take 1 capsule (300 mg total) by mouth 3 (three) times daily. 90 capsule 0  . Ginkgo Biloba (GINKOBA PO) Take 1 tablet by mouth daily.     . hydrALAZINE (APRESOLINE) 25 MG tablet Take 1 tablet (25 mg total) by mouth 2 (two) times daily. 60 tablet 1  . ibuprofen (ADVIL) 400 MG tablet Take 400 mg by mouth every 6 (six) hours as needed for mild pain or moderate pain.  Liquid Gel    . lidocaine-prilocaine (EMLA) cream Apply to affected area once (Patient taking differently: Apply 1 application topically as needed [facility]). Apply to affected area once) 30 g 3  . lisinopril (ZESTRIL) 40 MG tablet Take 1 tablet (40 mg total) by mouth daily. 30 tablet 1  . Melatonin 10 MG TABS Take 10 mg by mouth at bedtime as needed (sleep).     . metoprolol tartrate (LOPRESSOR) 50 MG tablet Take 1 tablet (50 mg total) by mouth 2 (two) times daily. 60 tablet 1  . Multiple Vitamins-Minerals (CENTRUM SILVER PO) Take 1 tablet by mouth daily. Gummie    . nicotine (NICODERM CQ - DOSED IN MG/24 HOURS) 21 mg/24hr patch Place 1 patch (21 mg total) onto the skin daily. 28 patch 0  . oxyCODONE-acetaminophen (PERCOCET) 5-325 MG tablet Take 1 tablet by mouth every 4 (four) hours as needed for severe pain. 30 tablet 0  . prochlorperazine (COMPAZINE) 10 MG tablet Take 1 tablet (10 mg total) by mouth every 6 (six) hours as needed (Nausea or vomiting). 30 tablet 1   No current facility-administered medications for this visit.   Facility-Administered Medications Ordered in Other Visits  Medication Dose Route Frequency Provider Last Rate Last Admin  . heparin lock flush 100 unit/mL  500 Units Intravenous Once Earlie Server, MD      . heparin lock flush 100 unit/mL  500 Units Intracatheter Once PRN Earlie Server, MD      . sodium chloride flush (NS) 0.9 % injection 10 mL  10 mL Intravenous PRN Earlie Server, MD   10 mL at 08/12/20 0943     PHYSICAL EXAMINATION: ECOG PERFORMANCE STATUS: 1 -  Symptomatic but completely ambulatory Vitals:   08/12/20 0945  BP: (!) 92/59  Pulse: 84  Resp: 16  Temp: 99.2 F (37.3 C)  SpO2: 96%   Filed Weights   08/12/20 0945  Weight: 124 lb 8 oz (56.5 kg)    Physical Exam Constitutional:      General: She is not in acute distress. HENT:     Head: Normocephalic and atraumatic.  Eyes:     General: No scleral icterus. Cardiovascular:     Rate and Rhythm: Normal rate and regular rhythm.     Heart sounds: Normal heart sounds.  Pulmonary:     Effort: Pulmonary effort is normal. No respiratory distress.     Breath sounds: No wheezing.  Abdominal:     General: Bowel sounds are normal. There is no distension.     Palpations: Abdomen is soft.  Musculoskeletal:        General: No deformity. Normal range of motion.     Cervical back: Normal range of motion and neck supple.  Skin:    General: Skin is warm and dry.     Findings: No erythema or rash.  Neurological:     Mental Status: She is alert and oriented to person, place, and time. Mental status is at baseline.     Cranial Nerves: No cranial nerve deficit.     Coordination: Coordination normal.  Psychiatric:        Mood and Affect: Mood normal.     LABORATORY DATA:  I have reviewed the data as listed Lab Results  Component Value Date   WBC 21.9 (H) 08/12/2020   HGB 11.4 (L) 08/12/2020   HCT 33.1 (L) 08/12/2020   MCV 96.5 08/12/2020   PLT 399 08/12/2020   Recent Labs    07/10/20 1311 07/10/20 1311  07/12/20 1204 07/12/20 1204 08/08/20 0821 08/09/20 0421 08/12/20 0835  NA 136   < > 137   < > 133* 132* 130*  K 2.9*   < > 4.0   < > 2.9* 4.0 3.6  CL 97*   < > 98   < > 93* 97* 93*  CO2 28   < > 27   < > 29 27 28   GLUCOSE 100*   < > 114*   < > 117* 107* 120*  BUN 13   < > 8   < > 10 11 22   CREATININE 0.43*   < > 0.42*   < > <0.30* 0.39* 0.38*  CALCIUM 8.9   < > 9.0   < > 8.7* 8.6* 8.3*  GFRNONAA >60   < > >60   < > NOT CALCULATED >60 >60  GFRAA >60  --  >60  --   --    --   --   PROT 7.0  --   --   --  7.1  --  6.8  ALBUMIN 3.7  --   --   --  3.4*  --  3.1*  AST 20  --   --   --  12*  --  16  ALT 16  --   --   --  11  --  13  ALKPHOS 106  --   --   --  101  --  93  BILITOT 0.5  --   --   --  0.8  --  0.3   < > = values in this interval not displayed.   Iron/TIBC/Ferritin/ %Sat    Component Value Date/Time   IRON 37 05/03/2018 1826   FERRITIN 52 05/03/2018 1826      RADIOGRAPHIC STUDIES: I have personally reviewed the radiological images as listed and agreed with the findings in the report. DG Chest 2 View  Result Date: 08/10/2020 CLINICAL DATA:  Dizziness. EXAM: CHEST - 2 VIEW COMPARISON:  Chest CT 08/08/2020 FINDINGS: Stable cardiac and mediastinal contours. Port-A-Cath tip projects over the superior vena cava. Monitoring leads overlie the patient. No large area of pulmonary consolidation. No pleural effusion or pneumothorax. Thoracic spine degenerative changes. IMPRESSION: No active cardiopulmonary disease. Electronically Signed   By: Lovey Newcomer M.D.   On: 08/10/2020 15:16   DG Chest 2 View  Result Date: 08/08/2020 CLINICAL DATA:  Tachycardia EXAM: CHEST - 2 VIEW COMPARISON:  August 05, 2020 FINDINGS: The cardiomediastinal silhouette is unchanged in contour.LEFT chest port with tip terminating over the SVC. No pleural effusion. No pneumothorax. No acute pleuroparenchymal abnormality. Visualized abdomen is unremarkable. Multilevel degenerative changes of the thoracic spine. IMPRESSION: No acute cardiopulmonary abnormality. Electronically Signed   By: Valentino Saxon MD   On: 08/08/2020 10:30   CT ANGIO CHEST PE W OR WO CONTRAST  Result Date: 08/08/2020 CLINICAL DATA:  History of head and neck cancer with hypotension and shortness of breath EXAM: CT ANGIOGRAPHY CHEST WITH CONTRAST TECHNIQUE: Multidetector CT imaging of the chest was performed using the standard protocol during bolus administration of intravenous contrast. Multiplanar CT image  reconstructions and MIPs were obtained to evaluate the vascular anatomy. CONTRAST:  86mL OMNIPAQUE IOHEXOL 350 MG/ML SOLN COMPARISON:  Chest x-ray from earlier in the same day. FINDINGS: Cardiovascular: Thoracic aorta demonstrates atherosclerotic calcifications without aneurysmal dilatation or dissection. No cardiac enlargement is seen. No significant coronary calcifications are noted. The pulmonary artery shows a normal branching pattern. No focal filling defect  to suggest pulmonary embolism is seen. Mediastinum/Nodes: Thoracic inlet is within normal limits. Left chest wall port is seen. Needle is noted within the port. No significant hilar or mediastinal adenopathy is noted. The esophagus as visualized is within normal limits. Lungs/Pleura: The lungs are well aerated. No focal infiltrate or sizable effusion is seen. No pneumothorax or sizable parenchymal nodules are noted. Upper Abdomen: Visualized upper abdomen shows no acute abnormality. Musculoskeletal: Degenerative changes of the thoracic spine are noted. No acute rib abnormality is seen. Review of the MIP images confirms the above findings. IMPRESSION: No evidence of pulmonary emboli. No other focal abnormality is seen. Aortic Atherosclerosis (ICD10-I70.0). Electronically Signed   By: Inez Catalina M.D.   On: 08/08/2020 17:59   MR Brain W Wo Contrast  Result Date: 07/17/2020 CLINICAL DATA:  Right-sided facial pain for 4-5 months. Facial tumor. Possible perineural spread. EXAM: MRI HEAD WITHOUT AND WITH CONTRAST TECHNIQUE: Multiplanar, multiecho pulse sequences of the brain and surrounding structures were obtained without and with intravenous contrast. CONTRAST:  63mL GADAVIST GADOBUTROL 1 MMOL/ML IV SOLN COMPARISON:  Maxillofacial CT 07/10/2020 FINDINGS: Brain: No acute infarct, acute hemorrhage or extra-axial collection. Multifocal hyperintense T2-weighted signal within the white matter. Normal volume of CSF spaces. No chronic microhemorrhage. Normal  midline structures. There is abnormal thickening and contrast enhancement of the maxillary branch of the right trigeminal nerve within the right Meckel cave and foramen rotundum. Abnormal enhancement extends to the pterygopalatine fossa and along the lateral posterior aspect of the right maxillary sinus. There is also abnormal enhancement extending into the right pterygoid muscles. Vascular: Normal flow voids. Skull and upper cervical spine: Large right facial mass erodes through the right maxilla including the floor of the maxillary sinus, the right half of the hard palate and the right maxillary alveolar process. Sinuses/Orbits: Erosive mass involving the right maxillary sinus. Otherwise, the paranasal sinuses are clear. There is abnormal enhancement along the right infraorbital nerve. Other: None IMPRESSION: 1. Large right facial mass with right maxillary erosion involving the maxillary sinus, hard palate and pterygoid plates and muscles. 2. Perineural spread along multiple branches of the maxillary division of the right trigeminal nerve at the infraorbital canal, pterygopalatine fossa and foramen rotundum. Electronically Signed   By: Ulyses Jarred M.D.   On: 07/17/2020 20:04   NM PET Image Initial (PI) Skull Base To Thigh  Result Date: 07/17/2020 CLINICAL DATA:  Initial treatment strategy for head and neck cancer. EXAM: NUCLEAR MEDICINE PET SKULL BASE TO THIGH TECHNIQUE: 7.45 mCi F-18 FDG was injected intravenously. Full-ring PET imaging was performed from the skull base to thigh after the radiotracer. CT data was obtained and used for attenuation correction and anatomic localization. Fasting blood glucose: 102 mg/dl COMPARISON:  CT maxillofacial with contrast 07/10/2020 FINDINGS: Mediastinal blood pool activity: SUV max 1.99 Liver activity: SUV max NA NECK: The previously characterized soft tissue mass involving the right maxilla, right buccal space, floor of right nasal cavity and right pterygoid palatine  fossa, right orbital apex, right soft palate and right palatine tonsil is intensely FDG avid and has an SUV max of 13.74. The recently described suspicious, 7 mm right level 1a lymph node has an SUV max of 2.12. Incidental CT findings: none CHEST: No hypermetabolic mediastinal or hilar nodes. No suspicious pulmonary nodules on the CT scan. Incidental CT findings: Aortic atherosclerosis. ABDOMEN/PELVIS: No abnormal hypermetabolic activity within the liver, pancreas, adrenal glands, or spleen. No hypermetabolic lymph nodes in the abdomen or pelvis. Incidental CT findings: Cholecystectomy.  Aortic atherosclerosis. SKELETON: No signs of FDG avid distant osseous metastasis. Incidental CT findings: none IMPRESSION: 1. Intense FDG uptake is associated with the large enhancing tumor which involves the right maxilla, right nasal cavity, right pterygoid palatine fossa, right orbital apex and right soft tissue palate and palatine tonsil. 2. Mild nonspecific FDG uptake is associated with the recently characterized suspicious right level 1A lymph node which measures 7 mm and has an SUV max of 2.12. 3. No signs of FDG avid distant metastatic disease. 4.  Aortic Atherosclerosis (ICD10-I70.0). Electronically Signed   By: Kerby Moors M.D.   On: 07/17/2020 15:26   US Venous Img Lower Bilateral (DVT)  Result Date: 08/08/2020 CLINICAL DATA:  Positive D-dimer. EXAM: BILATERAL LOWER EXTREMITY VENOUS DOPPLER ULTRASOUND TECHNIQUE: Gray-scale sonography with compression, as well as color and duplex ultrasound, were performed to evaluate the deep venous system(s) from the level of the common femoral vein through the popliteal and proximal calf veins. COMPARISON:  None. FINDINGS: VENOUS Normal compressibility of the common femoral, superficial femoral, and popliteal veins, as well as the visualized calf veins. Visualized portions of profunda femoral vein and great saphenous vein unremarkable. No filling defects to suggest DVT on  grayscale or color Doppler imaging. Doppler waveforms show normal direction of venous flow, normal respiratory plasticity and response to augmentation. OTHER None. Limitations: none IMPRESSION: Negative. Electronically Signed   By: Constance Holster M.D.   On: 08/08/2020 18:27   DG Chest Port 1 View  Result Date: 08/05/2020 CLINICAL DATA:  Status post Port-A-Cath placement EXAM: PORTABLE CHEST 1 VIEW COMPARISON:  None. FINDINGS: Left-sided Port-A-Cath with the tip projecting over the SVC. No focal consolidation. No pleural effusion or pneumothorax. Heart and mediastinal contours are unremarkable. No acute osseous abnormality. IMPRESSION: Left-sided Port-A-Cath with the tip projecting over the SVC. Electronically Signed   By: Kathreen Devoid   On: 08/05/2020 11:52   DG C-Arm 1-60 Min-No Report  Result Date: 08/05/2020 Fluoroscopy was utilized by the requesting physician.  No radiographic interpretation.   ECHOCARDIOGRAM COMPLETE  Result Date: 08/09/2020    ECHOCARDIOGRAM REPORT   Patient Name:   ANVITHA HUTMACHER Date of Exam: 08/09/2020 Medical Rec #:  [id number]           Height:       66.0 in Accession #:    [id number]          Weight:       135.0 lb Date of Birth:  [birth year]            BSA:          1.692 m Patient Age:    74 years            BP:           178/86 mmHg Patient Gender: F                   HR:           60 bpm. Exam Location:  [facility] Procedure: 2D Echo, Color Doppler and Cardiac Doppler Indications:     Atrial Fibrillation 427.31  History:         Patient has prior history of Echocardiogram examinations, most                  recent 04/07/2019. COPD; Risk Factors:Hypertension. Bipolar                  disorder.  Sonographer:     Sherrie Sport  RDCS (AE) Referring Phys:  [place] Phys: Ida Rogue MD IMPRESSIONS  1. Left ventricular ejection fraction, by estimation, is 60 to 65%. The left ventricle has normal function. The left ventricle has no regional wall motion  abnormalities. There is mild left ventricular hypertrophy. Left ventricular diastolic parameters are consistent with Grade II diastolic dysfunction (pseudonormalization).  2. Right ventricular systolic function is normal. The right ventricular size is normal. There is normal pulmonary artery systolic pressure. The estimated right ventricular systolic pressure is 46.5 mmHg.  3. The mitral valve is normal in structure. Mild mitral valve regurgitation. FINDINGS  Left Ventricle: Left ventricular ejection fraction, by estimation, is 60 to 65%. The left ventricle has normal function. The left ventricle has no regional wall motion abnormalities. The left ventricular internal cavity size was normal in size. There is  mild left ventricular hypertrophy. Left ventricular diastolic parameters are consistent with Grade II diastolic dysfunction (pseudonormalization). Right Ventricle: The right ventricular size is normal. No increase in right ventricular wall thickness. Right ventricular systolic function is normal. There is normal pulmonary artery systolic pressure. The tricuspid regurgitant velocity is 2.02 m/s, and  with an assumed right atrial pressure of 5 mmHg, the estimated right ventricular systolic pressure is 03.5 mmHg. Left Atrium: Left atrial size was normal in size. Right Atrium: Right atrial size was normal in size. Pericardium: There is no evidence of pericardial effusion. Mitral Valve: The mitral valve is normal in structure. Mild mitral valve regurgitation. No evidence of mitral valve stenosis. Tricuspid Valve: The tricuspid valve is normal in structure. Tricuspid valve regurgitation is mild . No evidence of tricuspid stenosis. Aortic Valve: The aortic valve was not well visualized. Aortic valve regurgitation is trivial. No aortic stenosis is present. Aortic valve mean gradient measures 3.5 mmHg. Aortic valve peak gradient measures 6.4 mmHg. Aortic valve area, by VTI measures 3.73 cm. Pulmonic Valve: The pulmonic  valve was normal in structure. Pulmonic valve regurgitation is not visualized. No evidence of pulmonic stenosis. Aorta: The aortic root is normal in size and structure. Venous: The inferior vena cava is normal in size with greater than 50% respiratory variability, suggesting right atrial pressure of 3 mmHg. IAS/Shunts: No atrial level shunt detected by color flow Doppler.  LEFT VENTRICLE PLAX 2D LVIDd:         4.07 cm  Diastology LVIDs:         2.71 cm  LV e' medial:    5.77 cm/s LV PW:         1.29 cm  LV E/e' medial:  13.8 LV IVS:        0.88 cm  LV e' lateral:   9.14 cm/s LVOT diam:     2.10 cm  LV E/e' lateral: 8.7 LV SV:         86 LV SV Index:   51 LVOT Area:     3.46 cm  RIGHT VENTRICLE RV Basal diam:  3.75 cm RV S prime:     12.00 cm/s TAPSE (M-mode): 3.4 cm LEFT ATRIUM           Index       RIGHT ATRIUM           Index LA diam:      3.20 cm 1.89 cm/m  RA Area:     15.30 cm LA Vol (A2C): 27.9 ml 16.49 ml/m RA Volume:   39.50 ml  23.34 ml/m LA Vol (A4C): 38.6 ml 22.81 ml/m  AORTIC VALVE  PULMONIC VALVE AV Area (Vmax):    2.70 cm    PV Vmax:        0.85 m/s AV Area (Vmean):   3.01 cm    PV Peak grad:   2.9 mmHg AV Area (VTI):     3.73 cm    RVOT Peak grad: 3 mmHg AV Vmax:           126.50 cm/s AV Vmean:          87.700 cm/s AV VTI:            0.230 m AV Peak Grad:      6.4 mmHg AV Mean Grad:      3.5 mmHg LVOT Vmax:         98.60 cm/s LVOT Vmean:        76.300 cm/s LVOT VTI:          0.248 m LVOT/AV VTI ratio: 1.08  AORTA Ao Root diam: 2.60 cm MITRAL VALVE               TRICUSPID VALVE MV Area (PHT): 3.63 cm    TR Peak grad:   16.3 mmHg MV Decel Time: 209 msec    TR Vmax:        202.00 cm/s MV E velocity: 79.70 cm/s MV A velocity: 71.60 cm/s  SHUNTS MV E/A ratio:  1.11        Systemic VTI:  0.25 m                            Systemic Diam: 2.10 cm Ida Rogue MD Electronically signed by Ida Rogue MD Signature Date/Time: 08/09/2020/2:59:39 PM    Final       ASSESSMENT &  PLAN:  1. 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[place])   2. Encounter for antineoplastic chemotherapy   3. Neoplasm related pain   4. Hypotension, unspecified hypotension type   5. Weight loss   6. 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  Labs are reviewed and discussed with patient. Proceed with cycle 1 cisplatin today. Concurrent chemotherapy radiation. Recommend patient to use Peridex mouthwash.  #Her blood pressure is borderline, 92/59.  Patient received 1 L of IV fluid with potassium and magnesium as premed prior to cisplatin.  I also order additional 500 normal saline to be given afterwards. #neoplasm induced pain,  Continue percocet q4 hours as needed for pain.  fentanyl patch 39mcg/h Q72 hours. .  Continue garbapentine 300mg  TID.  I refilled Percocet per patient request.  # Weight loss  Continue nutrition supplementation. Follow up with nutritionist.  #Atrial fibrillation, currently in sinus rhythm.  Continue beta-blocker.  Continue Eliquis per cardiology recommendation. All questions were answered. The patient knows to call the clinic with any problems questions or concerns.   Return of visit: 1 week  Earlie Server, MD, PhD Hematology Oncology [facility] Pager- [phone] 08/12/2020

## 2020-08-12 NOTE — Progress Notes (Signed)
1040: Per Duwayne Heck CMA per Dr. Tasia Catchings, okay to proceed with treatment with B/P 92/59.  1320: urine output 150cc. Per Dr. Tasia Catchings, okay to proceed with treatment at this time, pt to receive an additional 500cc NS. **see MAR**.   1633: pt tolerated infusion well. No s/sof distress or reaction noted. Pt stable at time of discharge.

## 2020-08-12 NOTE — Addendum Note (Signed)
Addended by: Earlie Server on: 08/12/2020 04:32 PM   Modules accepted: Level of Service

## 2020-08-13 ENCOUNTER — Other Ambulatory Visit: Payer: Self-pay

## 2020-08-13 ENCOUNTER — Ambulatory Visit
Admission: RE | Admit: 2020-08-13 | Discharge: 2020-08-13 | Disposition: A | Discharge status: Home / Self Care | Payer: Medicaid Other | Source: Ambulatory Visit | Attending: Radiation Oncology | Admitting: Radiation Oncology

## 2020-08-13 ENCOUNTER — Telehealth: Payer: Self-pay

## 2020-08-13 DIAGNOSIS — Z510 Encounter for antineoplastic radiation therapy: Secondary | ICD-10-CM | POA: Diagnosis not present

## 2020-08-13 LAB — CULTURE, BLOOD (ROUTINE X 2)
Culture: NO GROWTH
Culture: NO GROWTH
Special Requests: ADEQUATE

## 2020-08-13 MED ORDER — CHLORHEXIDINE GLUCONATE 0.12 % MT SOLN: 10.0000 mL | Freq: Two times a day (BID) | OROMUCOSAL | 1 refills | Status: DC

## 2020-08-13 NOTE — Telephone Encounter (Signed)
T/C to pt for follow up after receiving first infusion yesterday.  No answer but left message letting her know we were calling to check on her.   Encouraged pt to call for any questions or concerns. °

## 2020-08-14 ENCOUNTER — Ambulatory Visit
Admission: RE | Admit: 2020-08-14 | Discharge: 2020-08-14 | Disposition: A | Discharge status: Home / Self Care | Payer: Medicaid Other | Source: Ambulatory Visit | Attending: Radiation Oncology | Admitting: Radiation Oncology

## 2020-08-14 DIAGNOSIS — Z510 Encounter for antineoplastic radiation therapy: Secondary | ICD-10-CM | POA: Diagnosis not present

## 2020-08-14 NOTE — Telephone Encounter (Signed)
Left message on [facility] clinical line to follow up on surgical path report. Asked them to fax report if it is available.

## 2020-08-15 ENCOUNTER — Inpatient Hospital Stay: Payer: Medicaid Other

## 2020-08-15 ENCOUNTER — Ambulatory Visit
Admission: RE | Admit: 2020-08-15 | Discharge: 2020-08-15 | Disposition: A | Discharge status: Home / Self Care | Payer: Medicaid Other | Source: Ambulatory Visit | Attending: Radiation Oncology | Admitting: Radiation Oncology

## 2020-08-15 DIAGNOSIS — Z510 Encounter for antineoplastic radiation therapy: Secondary | ICD-10-CM | POA: Diagnosis not present

## 2020-08-15 NOTE — Progress Notes (Signed)
Nutrition Follow-up:  Patient with squamous cell carcinoma of right maxilla infiltrates right buccal, floor of nasal cavity, right patergoid palatine fossa, the right orbital apex and right soft palate, and palatine tonsil.  Noted recent hospital admission for afib. Patient receiving concurrent chemotherapy and radiation therapy.   Spoke with patient via phone.  Patient reports that she is eating a little bit better and pain is better controlled.  Drinking 3-4 boost plus or ensure plus shakes daily. Yesterday was not a good day per patient and drank shakes and ate ice cream and pudding.  Has been able to eat hot dogs, macaroni and cheese, cheese omelet.  Does not like smoothies.       Medications: reviewed  Labs: reviewed  Anthropometrics:   Weight 124 lb 8 oz on 10/11 decreased from 132 lb 3 oz on 9/23  UBW of 170-180 lb in March 2021   NUTRITION DIAGNOSIS: Inadequate oral intake continues   INTERVENTION:  Concerned about significant weight loss, treatment side effects and decreased intake that patient could benefit from feeding tube placement.  Recommend SLP evaluation as patient receiving radiation treatment.  Reviewed strategies to increase calories and protein Continue high calories shakes for added nutrition    MONITORING, EVALUATION, GOAL: weight trends, intake   NEXT VISIT: October 21 phone f/u  Jewell Haught B. Zenia Resides, [place], [place] Registered Dietitian [phone] (mobile)

## 2020-08-16 ENCOUNTER — Ambulatory Visit
Admission: RE | Admit: 2020-08-16 | Discharge: 2020-08-16 | Disposition: A | Discharge status: Home / Self Care | Payer: Medicaid Other | Source: Ambulatory Visit | Attending: Radiation Oncology | Admitting: Radiation Oncology

## 2020-08-16 DIAGNOSIS — Z510 Encounter for antineoplastic radiation therapy: Secondary | ICD-10-CM | POA: Diagnosis not present

## 2020-08-16 NOTE — Telephone Encounter (Signed)
MD notified that we have not received path report results. Unable to request slides since we don't have info of where specimen may be located

## 2020-08-19 ENCOUNTER — Other Ambulatory Visit: Payer: Self-pay

## 2020-08-19 ENCOUNTER — Inpatient Hospital Stay: Payer: Medicaid Other

## 2020-08-19 ENCOUNTER — Ambulatory Visit
Admission: RE | Admit: 2020-08-19 | Discharge: 2020-08-19 | Disposition: A | Discharge status: Home / Self Care | Payer: Medicaid Other | Source: Ambulatory Visit | Attending: Radiation Oncology | Admitting: Radiation Oncology

## 2020-08-19 ENCOUNTER — Inpatient Hospital Stay (HOSPITAL_BASED_OUTPATIENT_CLINIC_OR_DEPARTMENT_OTHER): Payer: Medicaid Other | Admitting: Oncology

## 2020-08-19 ENCOUNTER — Encounter: Payer: Self-pay | Admitting: Oncology

## 2020-08-19 VITALS — BP 94/60 | HR 74 | Temp 98.7°F | Resp 18 | Wt 121.8 lb

## 2020-08-19 DIAGNOSIS — Z510 Encounter for antineoplastic radiation therapy: Secondary | ICD-10-CM | POA: Diagnosis not present

## 2020-08-19 DIAGNOSIS — E871 Hypo-osmolality and hyponatremia: Secondary | ICD-10-CM

## 2020-08-19 DIAGNOSIS — Z23 Encounter for immunization: Secondary | ICD-10-CM

## 2020-08-19 DIAGNOSIS — Z8679 Personal history of other diseases of the circulatory system: Secondary | ICD-10-CM

## 2020-08-19 DIAGNOSIS — G893 Neoplasm related pain (acute) (chronic): Secondary | ICD-10-CM

## 2020-08-19 DIAGNOSIS — C760 Malignant neoplasm of head, face and neck: Secondary | ICD-10-CM

## 2020-08-19 DIAGNOSIS — C310 Malignant neoplasm of maxillary sinus: Secondary | ICD-10-CM

## 2020-08-19 DIAGNOSIS — Z5111 Encounter for antineoplastic chemotherapy: Secondary | ICD-10-CM

## 2020-08-19 LAB — COMPREHENSIVE METABOLIC PANEL
ALT: 32 U/L (ref 0–44)
AST: 29 U/L (ref 15–41)
Albumin: 2.9 g/dL — ABNORMAL LOW (ref 3.5–5.0)
Alkaline Phosphatase: 134 U/L — ABNORMAL HIGH (ref 38–126)
Anion gap: 10 (ref 5–15)
BUN: 17 mg/dL (ref 8–23)
CO2: 28 mmol/L (ref 22–32)
Calcium: 8.4 mg/dL — ABNORMAL LOW (ref 8.9–10.3)
Chloride: 92 mmol/L — ABNORMAL LOW (ref 98–111)
Creatinine, Ser: 0.46 mg/dL (ref 0.44–1.00)
GFR, Estimated: >60 mL/min (ref >60)
Glucose, Bld: 116 mg/dL — ABNORMAL HIGH (ref 70–99)
Potassium: 3.5 mmol/L (ref 3.5–5.1)
Sodium: 130 mmol/L — ABNORMAL LOW (ref 135–145)
Total Bilirubin: 0.4 mg/dL (ref 0.3–1.2)
Total Protein: 6.8 g/dL (ref 6.5–8.1)

## 2020-08-19 LAB — CBC WITH DIFFERENTIAL/PLATELET
Abs Immature Granulocytes: 0.1 10*3/uL — ABNORMAL HIGH (ref 0.00–0.07)
Basophils Absolute: 0.1 10*3/uL (ref 0.0–0.1)
Basophils Relative: 1 %
Eosinophils Absolute: 0 10*3/uL (ref 0.0–0.5)
Eosinophils Relative: 0 %
HCT: 30.8 % — ABNORMAL LOW (ref 36.0–46.0)
Hemoglobin: 10.6 g/dL — ABNORMAL LOW (ref 12.0–15.0)
Immature Granulocytes: 1 %
Lymphocytes Relative: 4 %
Lymphs Abs: 0.7 10*3/uL (ref 0.7–4.0)
MCH: 33.4 pg (ref 26.0–34.0)
MCHC: 34.4 g/dL (ref 30.0–36.0)
MCV: 97.2 fL (ref 80.0–100.0)
Monocytes Absolute: 1.8 10*3/uL — ABNORMAL HIGH (ref 0.1–1.0)
Monocytes Relative: 11 %
Neutro Abs: 13.6 10*3/uL — ABNORMAL HIGH (ref 1.7–7.7)
Neutrophils Relative %: 83 %
Platelets: 484 10*3/uL — ABNORMAL HIGH (ref 150–400)
RBC: 3.17 MIL/uL — ABNORMAL LOW (ref 3.87–5.11)
RDW: 12.7 % (ref 11.5–15.5)
WBC: 16.3 10*3/uL — ABNORMAL HIGH (ref 4.0–10.5)
nRBC: 0 % (ref 0.0–0.2)

## 2020-08-19 LAB — MAGNESIUM: Magnesium: 2 mg/dL (ref 1.7–2.4)

## 2020-08-19 MED ORDER — SODIUM CHLORIDE 0.9 % IV SOLN
Freq: Once | INTRAVENOUS | Status: AC
  Filled 2020-08-19: qty 1000

## 2020-08-19 MED ORDER — INFLUENZA VAC A&B SA ADJ QUAD 0.5 ML IM PRSY
0.5000 mL | PREFILLED_SYRINGE | Freq: Once | INTRAMUSCULAR | Status: AC
  Administered 2020-08-19: 0.5 mL via INTRAMUSCULAR
  Filled 2020-08-19: qty 0.5

## 2020-08-19 MED ORDER — FENTANYL 25 MCG/HR TD PT72
1.0000 | MEDICATED_PATCH | TRANSDERMAL | 0 refills | Status: DC
Start: 2020-08-19 — End: 2020-10-22

## 2020-08-19 MED ORDER — SODIUM CHLORIDE 0.9% FLUSH
10.0000 mL | Freq: Once | INTRAVENOUS | Status: AC
  Administered 2020-08-19: 10 mL via INTRAVENOUS
  Filled 2020-08-19: qty 10

## 2020-08-19 MED ORDER — SODIUM CHLORIDE 0.9 % IV SOLN
150.0000 mg | Freq: Once | INTRAVENOUS | Status: AC
  Administered 2020-08-19: 150 mg via INTRAVENOUS
  Filled 2020-08-19: qty 150

## 2020-08-19 MED ORDER — PALONOSETRON HCL INJECTION 0.25 MG/5ML
0.2500 mg | Freq: Once | INTRAVENOUS | Status: AC
  Administered 2020-08-19: 0.25 mg via INTRAVENOUS
  Filled 2020-08-19: qty 5

## 2020-08-19 MED ORDER — SODIUM CHLORIDE 0.9 % IV SOLN
10.0000 mg | Freq: Once | INTRAVENOUS | Status: AC
  Administered 2020-08-19: 10 mg via INTRAVENOUS
  Filled 2020-08-19: qty 10

## 2020-08-19 MED ORDER — HEPARIN SOD (PORK) LOCK FLUSH 100 UNIT/ML IV SOLN
INTRAVENOUS | Status: AC
  Filled 2020-08-19: qty 5

## 2020-08-19 MED ORDER — SODIUM CHLORIDE 0.9 % IV SOLN
Freq: Once | INTRAVENOUS | Status: AC
  Filled 2020-08-19: qty 250

## 2020-08-19 MED ORDER — SODIUM CHLORIDE 0.9 % IV SOLN
40.0000 mg/m2 | Freq: Once | INTRAVENOUS | Status: AC
  Administered 2020-08-19: 66 mg via INTRAVENOUS
  Filled 2020-08-19: qty 66

## 2020-08-19 MED ORDER — DEXAMETHASONE 4 MG PO TABS: 4.0000 mg | ORAL_TABLET | Freq: Every day | ORAL | 0 refills | Status: DC

## 2020-08-19 NOTE — Progress Notes (Signed)
Hematology/Oncology follow up note [facility] [phone] [phone]   Patient Care Team: Pcp, No as PCP - General Rockey Situ, Kathlene November, MD as PCP - Cardiology (Cardiology) Earlie Server, MD as Consulting Physician (Oncology)  CHIEF COMPLAINTS/REASON FOR VISIT:  Follow-up for head and neck cancer  HISTORY OF PRESENTING ILLNESS:   Shannon Schroeder is a  64 y.o.  female with PMH listed below was seen in consultation at the request of ER Dr. [name] for evaluation of abnormal CT scan.  07/10/2020 she presented emergency room for evaluation of right-sided facial swelling and pain in the right side of her mouth for the past 6 weeks.  It started in her mouth with a small ulcer which progressively got worse.  She wears denture.  Not able to chew well.  She eats soft food. She was found to have right upper gum also with yellowish discharge noted. 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  Avoid piloting.,  Right orbital apex, also the right soft palate and palate and tonsil.  Furthermore there is evidence of intracranial extension along the right V2 at the foramen rotundum Small suspicious right level 1A lymph node measuring 7 mm.  Separate small indeterminate but suspicious enhancing soft tissue nodule of the left sublingual space.  Patient was referred to oncology for further evaluation management. Patient denies fever, chills, nausea vomiting diarrhea shortness of breath or cough.  Reports 10 out of 10 severe pain of right face and numbness.  She also has headache. Reports unintentional weight loss  She reports no contactable family members.  She is in the process of pointing her roommate [name] to be her power of attorney.  She also wants to record today's conversation for her roommate  [name]. Patient has 50-pack-year smoking history.  # seen by ENT Dr.Juengel and had a biopsy. Biopsy was sent to [facility] and the final pathology was positive for invasive squamous cell carcinoma, well-differentiated, keratinizing. 07/17/2020, brain MRI showed large right facial mass with right maxillary erosion involving the maxillary sinus, hard palate and pterygoid plates and muscles.  Perineural spreading along the multiple branches of the maxillary division of the right trigeminal nerve at the infraorbital canal, pterygopalatine fossa and foramen rotundum. 07/17/2020, PET scan showed Intense FDG uptake is associated with the large enhancing tumor which involves the right maxilla, right nasal cavity, right pterygoid palatine fossa, right orbital apex and right soft tissue palate and palatine tonsil. 2. Mild nonspecific FDG uptake is associated with the recently characterized suspicious right level 1A lymph node which measures 7 mm and has an SUV max of 2.12.  No signs of distant metastasis.  Aortic atherosclerosis.  Patient also reports a history of hypertension, dysrhythmia, history of V. tach.  Patient previously was on lisinopril, atenolol, amlodipine for which she is no longer taking at this point due to lack of medication coverage.  She also does not have any primary care provider. 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  On average she says she is takes 2 to 3 pills/day.  She also takes gabapentin 300 mg 3 times daily  # new onset of atrial fibrillation with rapid ventricular  response.  Patient was started on metoprolol.  Eliquis for anticoagulation. INTERVAL HISTORY Shannon Schroeder is a 64 y.o. female who has above history reviewed by me today presents for follow up visit for management of head and neck  cancer. Problems and complaints are listed below: Patient is on amlodipine, lisinopril and metoprolol for blood pressure and A. Fib Today her blood pressure is low, 94/60, heart rate 74.  She denies any lightheadedness Patient has been on concurrent chemotherapy and so far tolerates well.  Denies any fever, chills.  She feels that her right facial pain has been controlled.  She requests to refill fentanyl patch.  She uses short acting narcotics 1-2 times per day.  She request something for appetite stimulant.  Decreased appetite and not eating well.  Review of Systems  Constitutional: Positive for fatigue and unexpected weight change. Negative for appetite change, chills and fever.  HENT:   Negative for hearing loss, mouth sores and voice change.        Facial swelling, pain and numbness  Eyes: Negative for eye problems.  Respiratory: Negative for chest tightness and cough.   Cardiovascular: Negative for chest pain and palpitations.  Gastrointestinal: Negative for abdominal distention, abdominal pain, blood in stool, nausea and vomiting.  Endocrine: Negative for hot flashes.  Genitourinary: Negative for difficulty urinating and frequency.   Musculoskeletal: Negative for arthralgias.  Skin: Negative for itching and rash.  Neurological: Negative for extremity weakness and headaches.  Hematological: Negative for adenopathy.  Psychiatric/Behavioral: Negative for confusion.    MEDICAL HISTORY:  Past Medical History:  Diagnosis Date  . 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   Marland Kitchen Anxiety   . Arthritis   . Bipolar disorder ([place])   . COPD (chronic obstructive pulmonary disease) ([place])    NO INHALERS  . Depression   . Dysrhythmia    H/O V TACH  . Head and neck cancer ([place]) 07/29/2020  . Headache    H/O MIGRAINES  . Hypertension   . Seizures ([place])    X1 AFTER FALL    SURGICAL HISTORY: Past Surgical History:  Procedure Laterality Date  .  ABDOMINAL HYSTERECTOMY     partial  . BREAST SURGERY     biopsy  . CARDIAC CATHETERIZATION     X 2  . CARPAL TUNNEL RELEASE Right   . CARPECTOMY HAND Right   . CATARACT EXTRACTION W/PHACO Left 03/08/2018   Procedure: CATARACT EXTRACTION PHACO AND INTRAOCULAR LENS PLACEMENT (IOC);  Surgeon: Birder Robson, MD;  Location: [facility] ORS;  Service: Ophthalmology;  Laterality: Left;  [place] 00:55 AP% 13.5 CDE 7.52 Fluid pack lot # [id number]  . CATARACT EXTRACTION W/PHACO Right 04/05/2018   Procedure: CATARACT EXTRACTION PHACO AND INTRAOCULAR LENS PLACEMENT (IOC);  Surgeon: Birder Robson, MD;  Location: [facility] ORS;  Service: Ophthalmology;  Laterality: Right;  [place] 00:36 AP% 15.9 CDE 5.72 Fluid pack lot # [id number]  . CEREBRAL ANEURYSM REPAIR    . CHOLECYSTECTOMY    . COLONOSCOPY WITH PROPOFOL N/A 04/26/2019   Procedure: COLONOSCOPY WITH PROPOFOL;  Surgeon: Jonathon Bellows, MD;  Location: [facility] ENDOSCOPY;  Service: Gastroenterology;  Laterality: N/A;  . PORTACATH PLACEMENT Left 08/05/2020   Procedure: INSERTION PORT-A-CATH;  Surgeon: Herbert Pun, MD;  Location: [facility] ORS;  Service: General;  Laterality: Left;  . TUBAL LIGATION      SOCIAL HISTORY: Social History   Socioeconomic History  . Marital status: Divorced    Spouse name: Not on file  .  Number of children: Not on file  . Years of education: 61  . Highest education level: Bachelor's degree (e.g., BA, AB, BS)  Occupational History  . Occupation: farmer  Tobacco Use  . Smoking status: Current Every Day Smoker    Packs/day: 1.00    Years: 50.00    Pack years: 50.00    Types: Cigarettes  . Smokeless tobacco: Never Used  Vaping Use  . Vaping Use: Never used  Substance and Sexual Activity  . Alcohol use: No  . Drug use: No  . Sexual activity: Not Currently  Other Topics Concern  . Not on file  Social History Narrative   Pt lives at [facility].    Social Determinants of Health   Financial Resource Strain:   .  Difficulty of Paying Living Expenses: Not on file  Food Insecurity:   . Worried About Charity fundraiser in the Last Year: Not on file  . Ran Out of Food in the Last Year: Not on file  Transportation Needs:   . Lack of Transportation (Medical): Not on file  . Lack of Transportation (Non-Medical): Not on file  Physical Activity:   . Days of Exercise per Week: Not on file  . Minutes of Exercise per Session: Not on file  Stress:   . Feeling of Stress : Not on file  Social Connections:   . Frequency of Communication with Friends and Family: Not on file  . Frequency of Social Gatherings with Friends and Family: Not on file  . Attends Religious Services: Not on file  . Active Member of Clubs or Organizations: Not on file  . Attends Archivist Meetings: Not on file  . Marital Status: Not on file  Intimate Partner Violence:   . Fear of Current or Ex-Partner: Not on file  . Emotionally Abused: Not on file  . Physically Abused: Not on file  . Sexually Abused: Not on file    FAMILY HISTORY: Family History  Problem Relation Age of Onset  . Hypertension Mother   . Heart failure Mother   . Hypertension Brother   . Stroke Brother   . Hypertension Son   . Emphysema Maternal Aunt   . Hypertension Paternal Aunt   . Hypertension Paternal Uncle   . Hypertension Maternal Grandmother   . Hypertension Paternal Grandmother     ALLERGIES:  is allergic to aspirin, belladonna alkaloids, fluoxetine hcl, naproxen, phenobarbital, and prozac [fluoxetine hcl].  MEDICATIONS:  Current Outpatient Medications  Medication Sig Dispense Refill  . acetaminophen (TYLENOL 8 HOUR ARTHRITIS PAIN) 650 MG CR tablet Take 1,300 mg by mouth daily as needed (Pain).     Marland Kitchen amLODipine (NORVASC) 10 MG tablet Take 1 tablet (10 mg total) by mouth daily. 30 tablet 1  . apixaban (ELIQUIS) 5 MG TABS tablet Take 1 tablet (5 mg total) by mouth 2 (two) times daily. 60 tablet 0  . b complex vitamins capsule Take 1  capsule by mouth daily. Gummie    . chlorhexidine (PERIDEX) 0.12 % solution Use as directed 10 mLs in the mouth or throat 2 (two) times daily. Swish and spit 473 mL 1  . feeding supplement, ENSURE ENLIVE, (ENSURE ENLIVE) LIQD Take 237 mLs by mouth 3 (three) times daily between meals. 237 mL 12  . fentaNYL (DURAGESIC) 25 MCG/HR Place 1 patch onto the skin every 3 (three) days. 5 patch 0  . gabapentin (NEURONTIN) 300 MG capsule Take 1 capsule (300 mg total) by mouth 3 (three)  times daily. 90 capsule 0  . Ginkgo Biloba (GINKOBA PO) Take 1 tablet by mouth daily.     . hydrALAZINE (APRESOLINE) 25 MG tablet Take 1 tablet (25 mg total) by mouth 2 (two) times daily. 60 tablet 1  . ibuprofen (ADVIL) 400 MG tablet Take 400 mg by mouth every 6 (six) hours as needed for mild pain or moderate pain. Liquid Gel    . lidocaine-prilocaine (EMLA) cream Apply to affected area once (Patient taking differently: Apply 1 application topically as needed [facility]). Apply to affected area once) 30 g 3  . lisinopril (ZESTRIL) 40 MG tablet Take 1 tablet (40 mg total) by mouth daily. 30 tablet 1  . Melatonin 10 MG TABS Take 10 mg by mouth at bedtime as needed (sleep).     . metoprolol tartrate (LOPRESSOR) 50 MG tablet Take 1 tablet (50 mg total) by mouth 2 (two) times daily. 60 tablet 1  . Multiple Vitamins-Minerals (CENTRUM SILVER PO) Take 1 tablet by mouth daily. Gummie    . nicotine (NICODERM CQ - DOSED IN MG/24 HOURS) 21 mg/24hr patch Place 1 patch (21 mg total) onto the skin daily. 28 patch 0  . oxyCODONE-acetaminophen (PERCOCET) 5-325 MG tablet Take 1 tablet by mouth every 4 (four) hours as needed for severe pain. 30 tablet 0  . prochlorperazine (COMPAZINE) 10 MG tablet Take 1 tablet (10 mg total) by mouth every 6 (six) hours as needed (Nausea or vomiting). 30 tablet 1   No current facility-administered medications for this visit.     PHYSICAL EXAMINATION: ECOG PERFORMANCE STATUS: 1 - Symptomatic but completely  ambulatory Vitals:   08/19/20 0850  BP: 94/60  Pulse: 74  Resp: 18  Temp: 98.7 F (37.1 C)  SpO2: 96%   Filed Weights   08/19/20 0850  Weight: 121 lb 12.8 oz (55.2 kg)    Physical Exam Constitutional:      General: She is not in acute distress. HENT:     Head: Normocephalic and atraumatic.     Nose:     Comments: Right facial swelling    Mouth/Throat:     Comments: No thrush.  Patient wears dentures Eyes:     General: No scleral icterus. Cardiovascular:     Rate and Rhythm: Normal rate and regular rhythm.     Heart sounds: Normal heart sounds.  Pulmonary:     Effort: Pulmonary effort is normal. No respiratory distress.     Breath sounds: No wheezing.  Abdominal:     General: Bowel sounds are normal. There is no distension.     Palpations: Abdomen is soft.  Musculoskeletal:        General: No deformity. Normal range of motion.     Cervical back: Normal range of motion and neck supple.  Skin:    General: Skin is warm and dry.     Findings: No erythema or rash.  Neurological:     Mental Status: She is alert and oriented to person, place, and time. Mental status is at baseline.     Cranial Nerves: No cranial nerve deficit.     Coordination: Coordination normal.  Psychiatric:        Mood and Affect: Mood normal.     LABORATORY DATA:  I have reviewed the data as listed Lab Results  Component Value Date   WBC 21.9 (H) 08/12/2020   HGB 11.4 (L) 08/12/2020   HCT 33.1 (L) 08/12/2020   MCV 96.5 08/12/2020   PLT 399 08/12/2020  Recent Labs    07/10/20 1311 07/10/20 1311 07/12/20 1204 07/12/20 1204 08/08/20 0821 08/09/20 0421 08/12/20 0835  NA 136   < > 137   < > 133* 132* 130*  K 2.9*   < > 4.0   < > 2.9* 4.0 3.6  CL 97*   < > 98   < > 93* 97* 93*  CO2 28   < > 27   < > 29 27 28   GLUCOSE 100*   < > 114*   < > 117* 107* 120*  BUN 13   < > 8   < > 10 11 22   CREATININE 0.43*   < > 0.42*   < > <0.30* 0.39* 0.38*  CALCIUM 8.9   < > 9.0   < > 8.7* 8.6* 8.3*   GFRNONAA >60   < > >60   < > NOT CALCULATED >60 >60  GFRAA >60  --  >60  --   --   --   --   PROT 7.0  --   --   --  7.1  --  6.8  ALBUMIN 3.7  --   --   --  3.4*  --  3.1*  AST 20  --   --   --  12*  --  16  ALT 16  --   --   --  11  --  13  ALKPHOS 106  --   --   --  101  --  93  BILITOT 0.5  --   --   --  0.8  --  0.3   < > = values in this interval not displayed.   Iron/TIBC/Ferritin/ %Sat    Component Value Date/Time   IRON 37 05/03/2018 1826   FERRITIN 52 05/03/2018 1826      RADIOGRAPHIC STUDIES: I have personally reviewed the radiological images as listed and agreed with the findings in the report. DG Chest 2 View  Result Date: 08/10/2020 CLINICAL DATA:  Dizziness. EXAM: CHEST - 2 VIEW COMPARISON:  Chest CT 08/08/2020 FINDINGS: Stable cardiac and mediastinal contours. Port-A-Cath tip projects over the superior vena cava. Monitoring leads overlie the patient. No large area of pulmonary consolidation. No pleural effusion or pneumothorax. Thoracic spine degenerative changes. IMPRESSION: No active cardiopulmonary disease. Electronically Signed   By: Lovey Newcomer M.D.   On: 08/10/2020 15:16   DG Chest 2 View  Result Date: 08/08/2020 CLINICAL DATA:  Tachycardia EXAM: CHEST - 2 VIEW COMPARISON:  August 05, 2020 FINDINGS: The cardiomediastinal silhouette is unchanged in contour.LEFT chest port with tip terminating over the SVC. No pleural effusion. No pneumothorax. No acute pleuroparenchymal abnormality. Visualized abdomen is unremarkable. Multilevel degenerative changes of the thoracic spine. IMPRESSION: No acute cardiopulmonary abnormality. Electronically Signed   By: Valentino Saxon MD   On: 08/08/2020 10:30   CT ANGIO CHEST PE W OR WO CONTRAST  Result Date: 08/08/2020 CLINICAL DATA:  History of head and neck cancer with hypotension and shortness of breath EXAM: CT ANGIOGRAPHY CHEST WITH CONTRAST TECHNIQUE: Multidetector CT imaging of the chest was performed using the standard  protocol during bolus administration of intravenous contrast. Multiplanar CT image reconstructions and MIPs were obtained to evaluate the vascular anatomy. CONTRAST:  63mL OMNIPAQUE IOHEXOL 350 MG/ML SOLN COMPARISON:  Chest x-ray from earlier in the same day. FINDINGS: Cardiovascular: Thoracic aorta demonstrates atherosclerotic calcifications without aneurysmal dilatation or dissection. No cardiac enlargement is seen. No significant coronary calcifications are noted. The pulmonary artery  shows a normal branching pattern. No focal filling defect to suggest pulmonary embolism is seen. Mediastinum/Nodes: Thoracic inlet is within normal limits. Left chest wall port is seen. Needle is noted within the port. No significant hilar or mediastinal adenopathy is noted. The esophagus as visualized is within normal limits. Lungs/Pleura: The lungs are well aerated. No focal infiltrate or sizable effusion is seen. No pneumothorax or sizable parenchymal nodules are noted. Upper Abdomen: Visualized upper abdomen shows no acute abnormality. Musculoskeletal: Degenerative changes of the thoracic spine are noted. No acute rib abnormality is seen. Review of the MIP images confirms the above findings. IMPRESSION: No evidence of pulmonary emboli. No other focal abnormality is seen. Aortic Atherosclerosis (ICD10-I70.0). Electronically Signed   By: Inez Catalina M.D.   On: 08/08/2020 17:59   US Venous Img Lower Bilateral (DVT)  Result Date: 08/08/2020 CLINICAL DATA:  Positive D-dimer. EXAM: BILATERAL LOWER EXTREMITY VENOUS DOPPLER ULTRASOUND TECHNIQUE: Gray-scale sonography with compression, as well as color and duplex ultrasound, were performed to evaluate the deep venous system(s) from the level of the common femoral vein through the popliteal and proximal calf veins. COMPARISON:  None. FINDINGS: VENOUS Normal compressibility of the common femoral, superficial femoral, and popliteal veins, as well as the visualized calf veins.  Visualized portions of profunda femoral vein and great saphenous vein unremarkable. No filling defects to suggest DVT on grayscale or color Doppler imaging. Doppler waveforms show normal direction of venous flow, normal respiratory plasticity and response to augmentation. OTHER None. Limitations: none IMPRESSION: Negative. Electronically Signed   By: Constance Holster M.D.   On: 08/08/2020 18:27   DG Chest Port 1 View  Result Date: 08/05/2020 CLINICAL DATA:  Status post Port-A-Cath placement EXAM: PORTABLE CHEST 1 VIEW COMPARISON:  None. FINDINGS: Left-sided Port-A-Cath with the tip projecting over the SVC. No focal consolidation. No pleural effusion or pneumothorax. Heart and mediastinal contours are unremarkable. No acute osseous abnormality. IMPRESSION: Left-sided Port-A-Cath with the tip projecting over the SVC. Electronically Signed   By: Kathreen Devoid   On: 08/05/2020 11:52   DG C-Arm 1-60 Min-No Report  Result Date: 08/05/2020 Fluoroscopy was utilized by the requesting physician.  No radiographic interpretation.   ECHOCARDIOGRAM COMPLETE  Result Date: 08/09/2020    ECHOCARDIOGRAM REPORT   Patient Name:   JERIANNE ANSELMO Date of Exam: 08/09/2020 Medical Rec #:  [id number]           Height:       66.0 in Accession #:    [id number]          Weight:       135.0 lb Date of Birth:  01/27/1956            BSA:          1.692 m Patient Age:    46 years            BP:           178/86 mmHg Patient Gender: F                   HR:           60 bpm. Exam Location:  [facility] Procedure: 2D Echo, Color Doppler and Cardiac Doppler Indications:     Atrial Fibrillation 427.31  History:         Patient has prior history of Echocardiogram examinations, most                  recent 04/07/2019. COPD; Risk  Factors:Hypertension. Bipolar                  disorder.  Sonographer:     Sherrie Sport RDCS (AE) Referring Phys:  [place] Diagnosing Phys: Ida Rogue MD IMPRESSIONS  1. Left ventricular ejection fraction,  by estimation, is 60 to 65%. The left ventricle has normal function. The left ventricle has no regional wall motion abnormalities. There is mild left ventricular hypertrophy. Left ventricular diastolic parameters are consistent with Grade II diastolic dysfunction (pseudonormalization).  2. Right ventricular systolic function is normal. The right ventricular size is normal. There is normal pulmonary artery systolic pressure. The estimated right ventricular systolic pressure is 95.2 mmHg.  3. The mitral valve is normal in structure. Mild mitral valve regurgitation. FINDINGS  Left Ventricle: Left ventricular ejection fraction, by estimation, is 60 to 65%. The left ventricle has normal function. The left ventricle has no regional wall motion abnormalities. The left ventricular internal cavity size was normal in size. There is  mild left ventricular hypertrophy. Left ventricular diastolic parameters are consistent with Grade II diastolic dysfunction (pseudonormalization). Right Ventricle: The right ventricular size is normal. No increase in right ventricular wall thickness. Right ventricular systolic function is normal. There is normal pulmonary artery systolic pressure. The tricuspid regurgitant velocity is 2.02 m/s, and  with an assumed right atrial pressure of 5 mmHg, the estimated right ventricular systolic pressure is 84.1 mmHg. Left Atrium: Left atrial size was normal in size. Right Atrium: Right atrial size was normal in size. Pericardium: There is no evidence of pericardial effusion. Mitral Valve: The mitral valve is normal in structure. Mild mitral valve regurgitation. No evidence of mitral valve stenosis. Tricuspid Valve: The tricuspid valve is normal in structure. Tricuspid valve regurgitation is mild . No evidence of tricuspid stenosis. Aortic Valve: The aortic valve was not well visualized. Aortic valve regurgitation is trivial. No aortic stenosis is present. Aortic valve mean gradient measures 3.5 mmHg.  Aortic valve peak gradient measures 6.4 mmHg. Aortic valve area, by VTI measures 3.73 cm. Pulmonic Valve: The pulmonic valve was normal in structure. Pulmonic valve regurgitation is not visualized. No evidence of pulmonic stenosis. Aorta: The aortic root is normal in size and structure. Venous: The inferior vena cava is normal in size with greater than 50% respiratory variability, suggesting right atrial pressure of 3 mmHg. IAS/Shunts: No atrial level shunt detected by color flow Doppler.  LEFT VENTRICLE PLAX 2D LVIDd:         4.07 cm  Diastology LVIDs:         2.71 cm  LV e' medial:    5.77 cm/s LV PW:         1.29 cm  LV E/e' medial:  13.8 LV IVS:        0.88 cm  LV e' lateral:   9.14 cm/s LVOT diam:     2.10 cm  LV E/e' lateral: 8.7 LV SV:         86 LV SV Index:   51 LVOT Area:     3.46 cm  RIGHT VENTRICLE RV Basal diam:  3.75 cm RV S prime:     12.00 cm/s TAPSE (M-mode): 3.4 cm LEFT ATRIUM           Index       RIGHT ATRIUM           Index LA diam:      3.20 cm 1.89 cm/m  RA Area:     15.30  cm LA Vol (A2C): 27.9 ml 16.49 ml/m RA Volume:   39.50 ml  23.34 ml/m LA Vol (A4C): 38.6 ml 22.81 ml/m  AORTIC VALVE                   PULMONIC VALVE AV Area (Vmax):    2.70 cm    PV Vmax:        0.85 m/s AV Area (Vmean):   3.01 cm    PV Peak grad:   2.9 mmHg AV Area (VTI):     3.73 cm    RVOT Peak grad: 3 mmHg AV Vmax:           126.50 cm/s AV Vmean:          87.700 cm/s AV VTI:            0.230 m AV Peak Grad:      6.4 mmHg AV Mean Grad:      3.5 mmHg LVOT Vmax:         98.60 cm/s LVOT Vmean:        76.300 cm/s LVOT VTI:          0.248 m LVOT/AV VTI ratio: 1.08  AORTA Ao Root diam: 2.60 cm MITRAL VALVE               TRICUSPID VALVE MV Area (PHT): 3.63 cm    TR Peak grad:   16.3 mmHg MV Decel Time: 209 msec    TR Vmax:        202.00 cm/s MV E velocity: 79.70 cm/s MV A velocity: 71.60 cm/s  SHUNTS MV E/A ratio:  1.11        Systemic VTI:  0.25 m                            Systemic Diam: 2.10 cm Ida Rogue MD  Electronically signed by Ida Rogue MD Signature Date/Time: 08/09/2020/2:59:39 PM    Final       ASSESSMENT & PLAN:  1. 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[place])   2. Encounter for antineoplastic chemotherapy   3. Hyponatremia   4. Neoplasm related pain   5. 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  Labs are reviewed and discussed with patient. Proceed with cycle 2 cisplatin today.  Overall she tolerates well. Concurrent chemotherapy and radiation. Continue Peridex mouthwash.  #Low blood pressure, she is asymptomatic.  Encourage oral hydration.  I asked patient to hold off amlodipine.   Patient will receive additional 500 cc of normal saline after her 1 L of pretreatment fluid.  #neoplasm induced pain,  Continue percocet q4 hours as needed for pain.  I reviewed her fentanyl patch 22mcg/h Q72 hours. .  Continue garbapentine 300mg  TID.  I refilled Percocet per patient request.  # Weight loss  Continue nutrition supplementation. Follow up with nutritionist.  #Atrial fibrillation, currently in sinus rhythm.  Continue beta-blocker.   Continue Eliquis.  Recommendation. All questions were answered. The patient knows to call the clinic with any problems questions or concerns.   Return of visit: 1 week  Earlie Server, MD, PhD Hematology Oncology [facility] Pager- [phone] 08/19/2020

## 2020-08-19 NOTE — Progress Notes (Signed)
Pt here for follow up. Pt reports she has been doing good after first chemo treatment. Pt requesting refill on fentanyl patches.

## 2020-08-19 NOTE — Progress Notes (Signed)
8403 : Per Duwayne Heck CMA- MD states ok to treat with BP 94/60   1155 - pt voided 175 ml .  ok per Dr Tasia Catchings to proceed with Cisplatin . Give additional 500 ml NS at completion of tx.

## 2020-08-20 ENCOUNTER — Ambulatory Visit
Admission: RE | Admit: 2020-08-20 | Discharge: 2020-08-20 | Disposition: A | Discharge status: Home / Self Care | Payer: Medicaid Other | Source: Ambulatory Visit | Attending: Radiation Oncology | Admitting: Radiation Oncology

## 2020-08-20 DIAGNOSIS — Z510 Encounter for antineoplastic radiation therapy: Secondary | ICD-10-CM | POA: Diagnosis not present

## 2020-08-21 ENCOUNTER — Ambulatory Visit
Admission: RE | Admit: 2020-08-21 | Discharge: 2020-08-21 | Disposition: A | Discharge status: Home / Self Care | Payer: Medicaid Other | Source: Ambulatory Visit | Attending: Radiation Oncology | Admitting: Radiation Oncology

## 2020-08-21 DIAGNOSIS — Z510 Encounter for antineoplastic radiation therapy: Secondary | ICD-10-CM | POA: Diagnosis not present

## 2020-08-21 NOTE — Telephone Encounter (Signed)
Request for slides and block of specimen [phone] has been faxed to [facility] oral pathology.   Fax #: [phone]

## 2020-08-22 ENCOUNTER — Ambulatory Visit
Admission: RE | Admit: 2020-08-22 | Discharge: 2020-08-22 | Disposition: A | Discharge status: Home / Self Care | Payer: Medicaid Other | Source: Ambulatory Visit | Attending: Radiation Oncology | Admitting: Radiation Oncology

## 2020-08-22 ENCOUNTER — Inpatient Hospital Stay: Payer: Medicaid Other

## 2020-08-22 ENCOUNTER — Other Ambulatory Visit: Payer: Self-pay

## 2020-08-22 VITALS — BP 97/54 | HR 100 | Resp 18

## 2020-08-22 DIAGNOSIS — C310 Malignant neoplasm of maxillary sinus: Secondary | ICD-10-CM

## 2020-08-22 DIAGNOSIS — C760 Malignant neoplasm of head, face and neck: Secondary | ICD-10-CM

## 2020-08-22 DIAGNOSIS — E871 Hypo-osmolality and hyponatremia: Secondary | ICD-10-CM

## 2020-08-22 DIAGNOSIS — Z510 Encounter for antineoplastic radiation therapy: Secondary | ICD-10-CM | POA: Diagnosis not present

## 2020-08-22 LAB — CBC WITH DIFFERENTIAL/PLATELET
Abs Immature Granulocytes: 0.07 10*3/uL (ref 0.00–0.07)
Basophils Absolute: 0.1 10*3/uL (ref 0.0–0.1)
Basophils Relative: 0 %
Eosinophils Absolute: 0 10*3/uL (ref 0.0–0.5)
Eosinophils Relative: 0 %
HCT: 32.3 % — ABNORMAL LOW (ref 36.0–46.0)
Hemoglobin: 10.9 g/dL — ABNORMAL LOW (ref 12.0–15.0)
Immature Granulocytes: 1 %
Lymphocytes Relative: 7 %
Lymphs Abs: 1 10*3/uL (ref 0.7–4.0)
MCH: 33.4 pg (ref 26.0–34.0)
MCHC: 33.7 g/dL (ref 30.0–36.0)
MCV: 99.1 fL (ref 80.0–100.0)
Monocytes Absolute: 1.3 10*3/uL — ABNORMAL HIGH (ref 0.1–1.0)
Monocytes Relative: 9 %
Neutro Abs: 12.6 10*3/uL — ABNORMAL HIGH (ref 1.7–7.7)
Neutrophils Relative %: 83 %
Platelets: 568 10*3/uL — ABNORMAL HIGH (ref 150–400)
RBC: 3.26 MIL/uL — ABNORMAL LOW (ref 3.87–5.11)
RDW: 12.9 % (ref 11.5–15.5)
WBC: 15 10*3/uL — ABNORMAL HIGH (ref 4.0–10.5)
nRBC: 0 % (ref 0.0–0.2)

## 2020-08-22 LAB — COMPREHENSIVE METABOLIC PANEL
ALT: 26 U/L (ref 0–44)
AST: 18 U/L (ref 15–41)
Albumin: 3 g/dL — ABNORMAL LOW (ref 3.5–5.0)
Alkaline Phosphatase: 98 U/L (ref 38–126)
Anion gap: 10 (ref 5–15)
BUN: 16 mg/dL (ref 8–23)
CO2: 27 mmol/L (ref 22–32)
Calcium: 8.4 mg/dL — ABNORMAL LOW (ref 8.9–10.3)
Chloride: 97 mmol/L — ABNORMAL LOW (ref 98–111)
Creatinine, Ser: 0.42 mg/dL — ABNORMAL LOW (ref 0.44–1.00)
GFR, Estimated: >60 mL/min (ref >60)
Glucose, Bld: 118 mg/dL — ABNORMAL HIGH (ref 70–99)
Potassium: 3.9 mmol/L (ref 3.5–5.1)
Sodium: 134 mmol/L — ABNORMAL LOW (ref 135–145)
Total Bilirubin: 0.5 mg/dL (ref 0.3–1.2)
Total Protein: 6.7 g/dL (ref 6.5–8.1)

## 2020-08-22 LAB — MAGNESIUM: Magnesium: 1.8 mg/dL (ref 1.7–2.4)

## 2020-08-22 MED ORDER — SODIUM CHLORIDE 0.9% FLUSH
10.0000 mL | Freq: Once | INTRAVENOUS | Status: AC | PRN
  Administered 2020-08-22: 10 mL
  Filled 2020-08-22: qty 10

## 2020-08-22 MED ORDER — SODIUM CHLORIDE 0.9 % IV SOLN
Freq: Once | INTRAVENOUS | Status: AC
  Filled 2020-08-22: qty 250

## 2020-08-22 MED ORDER — HEPARIN SOD (PORK) LOCK FLUSH 100 UNIT/ML IV SOLN
500.0000 [IU] | Freq: Once | INTRAVENOUS | Status: AC | PRN
  Administered 2020-08-22: 500 [IU]
  Filled 2020-08-22: qty 5

## 2020-08-22 MED ORDER — SODIUM CHLORIDE 0.9% FLUSH
10.0000 mL | Freq: Once | INTRAVENOUS | Status: AC
  Administered 2020-08-22: 10 mL via INTRAVENOUS
  Filled 2020-08-22: qty 10

## 2020-08-22 NOTE — Progress Notes (Signed)
Patient complaint of weakness.  Thinks she over did it yesterday.   Some pain with eating.  States she is eating and drinking ok.

## 2020-08-22 NOTE — Progress Notes (Signed)
Patient received 2 hours of IV hydration this afternoon in [facility] clinic. Pt drank an ensure while she was here. She states she feels better after fluids today. Discharged from clinic via wheelchair.

## 2020-08-23 ENCOUNTER — Ambulatory Visit
Admission: RE | Admit: 2020-08-23 | Discharge: 2020-08-23 | Disposition: A | Discharge status: Home / Self Care | Payer: Medicaid Other | Source: Ambulatory Visit | Attending: Radiation Oncology | Admitting: Radiation Oncology

## 2020-08-23 DIAGNOSIS — Z510 Encounter for antineoplastic radiation therapy: Secondary | ICD-10-CM | POA: Diagnosis not present

## 2020-08-23 NOTE — Telephone Encounter (Signed)
Received email from [facility] stating "The requested materials on patient Shannon Schroeder, Shannon Schroeder is a consult case from [facility]. Please contact [facility] in [place] and request the materials on Accession [id number]." Concatected [facility] and spoke ot a rep who said that we have to send a request for slides to them and then they will request slides from [facility] and this can take up to 90 days for them to get. Request for slides faxed to [facility] @ [phone].

## 2020-08-25 ENCOUNTER — Ambulatory Visit: Admission: RE | Admit: 2020-08-25 | Payer: Medicaid Other | Source: Ambulatory Visit

## 2020-08-26 ENCOUNTER — Inpatient Hospital Stay: Payer: Medicaid Other

## 2020-08-26 ENCOUNTER — Encounter: Payer: Self-pay | Admitting: Oncology

## 2020-08-26 ENCOUNTER — Ambulatory Visit: Payer: Medicaid Other

## 2020-08-26 ENCOUNTER — Other Ambulatory Visit: Payer: Self-pay

## 2020-08-26 ENCOUNTER — Ambulatory Visit
Admission: RE | Admit: 2020-08-26 | Discharge: 2020-08-26 | Disposition: A | Discharge status: Home / Self Care | Payer: Medicaid Other | Source: Ambulatory Visit | Attending: Radiation Oncology | Admitting: Radiation Oncology

## 2020-08-26 ENCOUNTER — Inpatient Hospital Stay (HOSPITAL_BASED_OUTPATIENT_CLINIC_OR_DEPARTMENT_OTHER): Payer: Medicaid Other | Admitting: Oncology

## 2020-08-26 VITALS — BP 93/66 | HR 85 | Temp 97.9°F | Resp 16 | Wt 120.1 lb

## 2020-08-26 DIAGNOSIS — C760 Malignant neoplasm of head, face and neck: Secondary | ICD-10-CM

## 2020-08-26 DIAGNOSIS — G893 Neoplasm related pain (acute) (chronic): Secondary | ICD-10-CM

## 2020-08-26 DIAGNOSIS — C310 Malignant neoplasm of maxillary sinus: Secondary | ICD-10-CM

## 2020-08-26 DIAGNOSIS — K5903 Drug induced constipation: Secondary | ICD-10-CM | POA: Insufficient documentation

## 2020-08-26 DIAGNOSIS — Z510 Encounter for antineoplastic radiation therapy: Secondary | ICD-10-CM | POA: Diagnosis not present

## 2020-08-26 DIAGNOSIS — Z5111 Encounter for antineoplastic chemotherapy: Secondary | ICD-10-CM

## 2020-08-26 DIAGNOSIS — Z8679 Personal history of other diseases of the circulatory system: Secondary | ICD-10-CM

## 2020-08-26 DIAGNOSIS — E871 Hypo-osmolality and hyponatremia: Secondary | ICD-10-CM

## 2020-08-26 DIAGNOSIS — R634 Abnormal weight loss: Secondary | ICD-10-CM

## 2020-08-26 LAB — COMPREHENSIVE METABOLIC PANEL
ALT: 29 U/L (ref 0–44)
AST: 22 U/L (ref 15–41)
Albumin: 3 g/dL — ABNORMAL LOW (ref 3.5–5.0)
Alkaline Phosphatase: 104 U/L (ref 38–126)
Anion gap: 10 (ref 5–15)
BUN: 15 mg/dL (ref 8–23)
CO2: 27 mmol/L (ref 22–32)
Calcium: 8.3 mg/dL — ABNORMAL LOW (ref 8.9–10.3)
Chloride: 94 mmol/L — ABNORMAL LOW (ref 98–111)
Creatinine, Ser: 0.43 mg/dL — ABNORMAL LOW (ref 0.44–1.00)
GFR, Estimated: >60 mL/min (ref >60)
Glucose, Bld: 109 mg/dL — ABNORMAL HIGH (ref 70–99)
Potassium: 3.5 mmol/L (ref 3.5–5.1)
Sodium: 131 mmol/L — ABNORMAL LOW (ref 135–145)
Total Bilirubin: 0.6 mg/dL (ref 0.3–1.2)
Total Protein: 6.8 g/dL (ref 6.5–8.1)

## 2020-08-26 LAB — CBC WITH DIFFERENTIAL/PLATELET
Abs Immature Granulocytes: 0.1 10*3/uL — ABNORMAL HIGH (ref 0.00–0.07)
Basophils Absolute: 0.1 10*3/uL (ref 0.0–0.1)
Basophils Relative: 0 %
Eosinophils Absolute: 0 10*3/uL (ref 0.0–0.5)
Eosinophils Relative: 0 %
HCT: 31.4 % — ABNORMAL LOW (ref 36.0–46.0)
Hemoglobin: 10.7 g/dL — ABNORMAL LOW (ref 12.0–15.0)
Immature Granulocytes: 1 %
Lymphocytes Relative: 2 %
Lymphs Abs: 0.4 10*3/uL — ABNORMAL LOW (ref 0.7–4.0)
MCH: 33.1 pg (ref 26.0–34.0)
MCHC: 34.1 g/dL (ref 30.0–36.0)
MCV: 97.2 fL (ref 80.0–100.0)
Monocytes Absolute: 1.1 10*3/uL — ABNORMAL HIGH (ref 0.1–1.0)
Monocytes Relative: 7 %
Neutro Abs: 14 10*3/uL — ABNORMAL HIGH (ref 1.7–7.7)
Neutrophils Relative %: 90 %
Platelets: 545 10*3/uL — ABNORMAL HIGH (ref 150–400)
RBC: 3.23 MIL/uL — ABNORMAL LOW (ref 3.87–5.11)
RDW: 12.6 % (ref 11.5–15.5)
WBC: 15.6 10*3/uL — ABNORMAL HIGH (ref 4.0–10.5)
nRBC: 0 % (ref 0.0–0.2)

## 2020-08-26 LAB — MAGNESIUM: Magnesium: 1.9 mg/dL (ref 1.7–2.4)

## 2020-08-26 MED ORDER — SODIUM CHLORIDE 0.9 % IV SOLN
Freq: Once | INTRAVENOUS | Status: AC
  Filled 2020-08-26: qty 1000

## 2020-08-26 MED ORDER — HEPARIN SOD (PORK) LOCK FLUSH 100 UNIT/ML IV SOLN
INTRAVENOUS | Status: AC
  Filled 2020-08-26: qty 5

## 2020-08-26 MED ORDER — PALONOSETRON HCL INJECTION 0.25 MG/5ML
0.2500 mg | Freq: Once | INTRAVENOUS | Status: AC
  Administered 2020-08-26: 0.25 mg via INTRAVENOUS
  Filled 2020-08-26: qty 5

## 2020-08-26 MED ORDER — SODIUM CHLORIDE 0.9% FLUSH
10.0000 mL | Freq: Once | INTRAVENOUS | Status: AC
  Administered 2020-08-26: 10 mL via INTRAVENOUS
  Filled 2020-08-26: qty 10

## 2020-08-26 MED ORDER — HEPARIN SOD (PORK) LOCK FLUSH 100 UNIT/ML IV SOLN
500.0000 [IU] | Freq: Once | INTRAVENOUS | Status: AC | PRN
  Administered 2020-08-26: 500 [IU]
  Filled 2020-08-26: qty 5

## 2020-08-26 MED ORDER — SODIUM CHLORIDE 0.9 % IV SOLN
10.0000 mg | Freq: Once | INTRAVENOUS | Status: AC
  Administered 2020-08-26: 10 mg via INTRAVENOUS
  Filled 2020-08-26: qty 10

## 2020-08-26 MED ORDER — DOCUSATE SODIUM 100 MG PO CAPS: 100.0000 mg | ORAL_CAPSULE | Freq: Every day | ORAL | 1 refills | Status: AC

## 2020-08-26 MED ORDER — SODIUM CHLORIDE 0.9 % IV SOLN
40.0000 mg/m2 | Freq: Once | INTRAVENOUS | Status: AC
  Administered 2020-08-26: 66 mg via INTRAVENOUS
  Filled 2020-08-26: qty 66

## 2020-08-26 MED ORDER — SODIUM CHLORIDE 0.9 % IV SOLN
150.0000 mg | Freq: Once | INTRAVENOUS | Status: AC
  Administered 2020-08-26: 150 mg via INTRAVENOUS
  Filled 2020-08-26: qty 5

## 2020-08-26 MED ORDER — SODIUM CHLORIDE 0.9 % IV SOLN
Freq: Once | INTRAVENOUS | Status: AC
  Filled 2020-08-26: qty 250

## 2020-08-26 NOTE — Progress Notes (Signed)
Hematology/Oncology follow up note [facility] [phone] [phone]   Patient Care Team: Pcp, No as PCP - General Rockey Situ, Kathlene November, MD as PCP - Cardiology (Cardiology) Earlie Server, MD as Consulting Physician (Oncology)  CHIEF COMPLAINTS/REASON FOR VISIT:  Follow-up for head and neck cancer  HISTORY OF PRESENTING ILLNESS:   Shannon Schroeder is a  64 y.o.  female with PMH listed below was seen in consultation at the request of ER Dr. [name] for evaluation of abnormal CT scan.  07/10/2020 she presented emergency room for evaluation of right-sided facial swelling and pain in the right side of her mouth for the past 6 weeks.  It started in her mouth with a small ulcer which progressively got worse.  She wears denture.  Not able to chew well.  She eats soft food. She was found to have right upper gum also with yellowish discharge noted. 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  Avoid piloting.,  Right orbital apex, also the right soft palate and palate and tonsil.  Furthermore there is evidence of intracranial extension along the right V2 at the foramen rotundum Small suspicious right level 1A lymph node measuring 7 mm.  Separate small indeterminate but suspicious enhancing soft tissue nodule of the left sublingual space.  Patient was referred to oncology for further evaluation management. Patient denies fever, chills, nausea vomiting diarrhea shortness of breath or cough.  Reports 10 out of 10 severe pain of right face and numbness.  She also has headache. Reports unintentional weight loss  She reports no contactable family members.  She is in the process of pointing her roommate [name] to be her power of attorney.  She also wants to record today's conversation for her roommate  [name]. Patient has 50-pack-year smoking history.  # seen by ENT Dr.Juengel and had a biopsy. Biopsy was sent to [facility] and the final pathology was positive for invasive squamous cell carcinoma, well-differentiated, keratinizing. 07/17/2020, brain MRI showed large right facial mass with right maxillary erosion involving the maxillary sinus, hard palate and pterygoid plates and muscles.  Perineural spreading along the multiple branches of the maxillary division of the right trigeminal nerve at the infraorbital canal, pterygopalatine fossa and foramen rotundum. 07/17/2020, PET scan showed Intense FDG uptake is associated with the large enhancing tumor which involves the right maxilla, right nasal cavity, right pterygoid palatine fossa, right orbital apex and right soft tissue palate and palatine tonsil. 2. Mild nonspecific FDG uptake is associated with the recently characterized suspicious right level 1A lymph node which measures 7 mm and has an SUV max of 2.12.  No signs of distant metastasis.  Aortic atherosclerosis.  Patient also reports a history of hypertension, dysrhythmia, history of V. tach.  Patient previously was on lisinopril, atenolol, amlodipine for which she is no longer taking at this point due to lack of medication coverage.  She also does not have any primary care provider. 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  On average she says she is takes 2 to 3 pills/day.  She also takes gabapentin 300 mg 3 times daily  # new onset of atrial fibrillation with rapid ventricular  response.  Patient was started on metoprolol.  Eliquis for anticoagulation. INTERVAL HISTORY Shannon Schroeder is a 64 y.o. female who has above history reviewed by me today presents for follow up visit for management of head and neck  cancer. Problems and complaints are listed below: Patient is on hydralazine, lisinopril and metoprolol for blood pressure and A. Fib Norvasc has been discontinued since last visit.  No lightheaded, fever, chills.  Reports facial pain is controlled.  Difficulty with eating and swallowing.  + constipation.    Review of Systems  Constitutional: Positive for fatigue and unexpected weight change. Negative for appetite change, chills and fever.  HENT:   Negative for hearing loss, mouth sores and voice change.        Facial swelling, pain and numbness  Eyes: Negative for eye problems.  Respiratory: Negative for chest tightness and cough.   Cardiovascular: Negative for chest pain and palpitations.  Gastrointestinal: Positive for constipation. Negative for abdominal distention, abdominal pain, blood in stool, nausea and vomiting.       Dysphagia  Endocrine: Negative for hot flashes.  Genitourinary: Negative for difficulty urinating and frequency.   Musculoskeletal: Negative for arthralgias.  Skin: Negative for itching and rash.  Neurological: Negative for extremity weakness and headaches.  Hematological: Negative for adenopathy.  Psychiatric/Behavioral: Negative for confusion.    MEDICAL HISTORY:  Past Medical History:  Diagnosis Date  . 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   Marland Kitchen Anxiety   . Arthritis   . Bipolar disorder ([place])   . COPD (chronic obstructive pulmonary disease) ([place])    NO INHALERS  . Depression   . Dysrhythmia    H/O V TACH  . Head and neck cancer ([place]) 07/29/2020  . Headache    H/O MIGRAINES  . Hypertension   . Seizures ([place])    X1 AFTER FALL    SURGICAL HISTORY: Past Surgical History:  Procedure Laterality Date  . ABDOMINAL HYSTERECTOMY     partial  . BREAST SURGERY     biopsy  . CARDIAC CATHETERIZATION     X 2  . CARPAL TUNNEL RELEASE Right   . CARPECTOMY HAND Right   . CATARACT EXTRACTION W/PHACO  Left 03/08/2018   Procedure: CATARACT EXTRACTION PHACO AND INTRAOCULAR LENS PLACEMENT (IOC);  Surgeon: Birder Robson, MD;  Location: [facility] ORS;  Service: Ophthalmology;  Laterality: Left;  [place] 00:55 AP% 13.5 CDE 7.52 Fluid pack lot # [id number]  . CATARACT EXTRACTION W/PHACO Right 04/05/2018   Procedure: CATARACT EXTRACTION PHACO AND INTRAOCULAR LENS PLACEMENT (IOC);  Surgeon: Birder Robson, MD;  Location: [facility] ORS;  Service: Ophthalmology;  Laterality: Right;  [place] 00:36 AP% 15.9 CDE 5.72 Fluid pack lot # [id number]  . CEREBRAL ANEURYSM REPAIR    . CHOLECYSTECTOMY    . COLONOSCOPY WITH PROPOFOL N/A 04/26/2019   Procedure: COLONOSCOPY WITH PROPOFOL;  Surgeon: Jonathon Bellows, MD;  Location: [facility] ENDOSCOPY;  Service: Gastroenterology;  Laterality: N/A;  . PORTACATH PLACEMENT Left 08/05/2020   Procedure: INSERTION PORT-A-CATH;  Surgeon: Herbert Pun, MD;  Location: [facility] ORS;  Service: General;  Laterality: Left;  . TUBAL LIGATION      SOCIAL HISTORY: Social History   Socioeconomic History  . Marital status: Divorced    Spouse name: Not on file  . Number of children: Not on file  . Years of education: 33  . Highest education level: Bachelor's degree (e.g., BA, AB, BS)  Occupational History  . Occupation: farmer  Tobacco  Use  . Smoking status: Current Every Day Smoker    Packs/day: 1.00    Years: 50.00    Pack years: 50.00    Types: Cigarettes  . Smokeless tobacco: Never Used  Vaping Use  . Vaping Use: Never used  Substance and Sexual Activity  . Alcohol use: No  . Drug use: No  . Sexual activity: Not Currently  Other Topics Concern  . Not on file  Social History Narrative   Pt lives at [facility].    Social Determinants of Health   Financial Resource Strain:   . Difficulty of Paying Living Expenses: Not on file  Food Insecurity:   . Worried About Charity fundraiser in the Last Year: Not on file  . Ran Out of Food in the Last Year: Not on file  Transportation  Needs:   . Lack of Transportation (Medical): Not on file  . Lack of Transportation (Non-Medical): Not on file  Physical Activity:   . Days of Exercise per Week: Not on file  . Minutes of Exercise per Session: Not on file  Stress:   . Feeling of Stress : Not on file  Social Connections:   . Frequency of Communication with Friends and Family: Not on file  . Frequency of Social Gatherings with Friends and Family: Not on file  . Attends Religious Services: Not on file  . Active Member of Clubs or Organizations: Not on file  . Attends Archivist Meetings: Not on file  . Marital Status: Not on file  Intimate Partner Violence:   . Fear of Current or Ex-Partner: Not on file  . Emotionally Abused: Not on file  . Physically Abused: Not on file  . Sexually Abused: Not on file    FAMILY HISTORY: Family History  Problem Relation Age of Onset  . Hypertension Mother   . Heart failure Mother   . Hypertension Brother   . Stroke Brother   . Hypertension Son   . Emphysema Maternal Aunt   . Hypertension Paternal Aunt   . Hypertension Paternal Uncle   . Hypertension Maternal Grandmother   . Hypertension Paternal Grandmother     ALLERGIES:  is allergic to aspirin, belladonna alkaloids, fluoxetine hcl, naproxen, phenobarbital, and prozac [fluoxetine hcl].  MEDICATIONS:  Current Outpatient Medications  Medication Sig Dispense Refill  . acetaminophen (TYLENOL 8 HOUR ARTHRITIS PAIN) 650 MG CR tablet Take 1,300 mg by mouth daily as needed (Pain).     Marland Kitchen amLODipine (NORVASC) 10 MG tablet Take 1 tablet (10 mg total) by mouth daily. 30 tablet 1  . apixaban (ELIQUIS) 5 MG TABS tablet Take 1 tablet (5 mg total) by mouth 2 (two) times daily. 60 tablet 0  . b complex vitamins capsule Take 1 capsule by mouth daily. Gummie    . chlorhexidine (PERIDEX) 0.12 % solution Use as directed 10 mLs in the mouth or throat 2 (two) times daily. Swish and spit 473 mL 1  . dexamethasone (DECADRON) 4 MG tablet  Take 1 tablet (4 mg total) by mouth daily. 7 tablet 0  . feeding supplement, ENSURE ENLIVE, (ENSURE ENLIVE) LIQD Take 237 mLs by mouth 3 (three) times daily between meals. 237 mL 12  . fentaNYL (DURAGESIC) 25 MCG/HR Place 1 patch onto the skin every 3 (three) days. 7 patch 0  . gabapentin (NEURONTIN) 300 MG capsule Take 1 capsule (300 mg total) by mouth 3 (three) times daily. 90 capsule 0  . Ginkgo Biloba (GINKOBA PO) Take 1  tablet by mouth daily.     . hydrALAZINE (APRESOLINE) 25 MG tablet Take 1 tablet (25 mg total) by mouth 2 (two) times daily. 60 tablet 1  . ibuprofen (ADVIL) 400 MG tablet Take 400 mg by mouth every 6 (six) hours as needed for mild pain or moderate pain. Liquid Gel    . lidocaine-prilocaine (EMLA) cream Apply to affected area once (Patient taking differently: Apply 1 application topically as needed [facility]). Apply to affected area once) 30 g 3  . lisinopril (ZESTRIL) 40 MG tablet Take 1 tablet (40 mg total) by mouth daily. 30 tablet 1  . Melatonin 10 MG TABS Take 10 mg by mouth at bedtime as needed (sleep).     . metoprolol tartrate (LOPRESSOR) 50 MG tablet Take 1 tablet (50 mg total) by mouth 2 (two) times daily. 60 tablet 1  . Multiple Vitamins-Minerals (CENTRUM SILVER PO) Take 1 tablet by mouth daily. Gummie    . nicotine (NICODERM CQ - DOSED IN MG/24 HOURS) 21 mg/24hr patch Place 1 patch (21 mg total) onto the skin daily. (Patient not taking: Reported on 08/19/2020) 28 patch 0  . oxyCODONE-acetaminophen (PERCOCET) 5-325 MG tablet Take 1 tablet by mouth every 4 (four) hours as needed for severe pain. 30 tablet 0  . prochlorperazine (COMPAZINE) 10 MG tablet Take 1 tablet (10 mg total) by mouth every 6 (six) hours as needed (Nausea or vomiting). 30 tablet 1   No current facility-administered medications for this visit.     PHYSICAL EXAMINATION: ECOG PERFORMANCE STATUS: 1 - Symptomatic but completely ambulatory Vitals:   08/26/20 0914  BP: 93/66  Pulse: 85  Resp: 16   Temp: 97.9 F (36.6 C)   Filed Weights   08/26/20 0914  Weight: 120 lb 1.6 oz (54.5 kg)    Physical Exam Constitutional:      General: She is not in acute distress. HENT:     Head: Normocephalic and atraumatic.     Nose:     Comments: Right facial swelling    Mouth/Throat:     Comments: No thrush.  Patient wears dentures Eyes:     General: No scleral icterus. Cardiovascular:     Rate and Rhythm: Normal rate and regular rhythm.     Heart sounds: Normal heart sounds.  Pulmonary:     Effort: Pulmonary effort is normal. No respiratory distress.     Breath sounds: No wheezing.  Abdominal:     General: Bowel sounds are normal. There is no distension.     Palpations: Abdomen is soft.  Musculoskeletal:        General: No deformity. Normal range of motion.     Cervical back: Normal range of motion and neck supple.  Skin:    General: Skin is warm and dry.     Findings: No erythema or rash.  Neurological:     Mental Status: She is alert and oriented to person, place, and time. Mental status is at baseline.     Cranial Nerves: No cranial nerve deficit.     Coordination: Coordination normal.  Psychiatric:        Mood and Affect: Mood normal.     LABORATORY DATA:  I have reviewed the data as listed Lab Results  Component Value Date   WBC 15.0 (H) 08/22/2020   HGB 10.9 (L) 08/22/2020   HCT 32.3 (L) 08/22/2020   MCV 99.1 08/22/2020   PLT 568 (H) 08/22/2020   Recent Labs    07/10/20 1311 07/10/20 1311  07/12/20 1204 08/08/20 0821 08/12/20 0835 08/19/20 0820 08/22/20 1255  NA 136   < > 137   < > 130* 130* 134*  K 2.9*   < > 4.0   < > 3.6 3.5 3.9  CL 97*   < > 98   < > 93* 92* 97*  CO2 28   < > 27   < > 28 28 27   GLUCOSE 100*   < > 114*   < > 120* 116* 118*  BUN 13   < > 8   < > 22 17 16   CREATININE 0.43*   < > 0.42*   < > 0.38* 0.46 0.42*  CALCIUM 8.9   < > 9.0   < > 8.3* 8.4* 8.4*  GFRNONAA >60   < > >60   < > >60 >60 >60  GFRAA >60  --  >60  --   --   --   --    PROT 7.0  --   --    < > 6.8 6.8 6.7  ALBUMIN 3.7  --   --    < > 3.1* 2.9* 3.0*  AST 20  --   --    < > 16 29 18   ALT 16  --   --    < > 13 32 26  ALKPHOS 106  --   --    < > 93 134* 98  BILITOT 0.5  --   --    < > 0.3 0.4 0.5   < > = values in this interval not displayed.   Iron/TIBC/Ferritin/ %Sat    Component Value Date/Time   IRON 37 05/03/2018 1826   FERRITIN 52 05/03/2018 1826      RADIOGRAPHIC STUDIES: I have personally reviewed the radiological images as listed and agreed with the findings in the report. DG Chest 2 View  Result Date: 08/10/2020 CLINICAL DATA:  Dizziness. EXAM: CHEST - 2 VIEW COMPARISON:  Chest CT 08/08/2020 FINDINGS: Stable cardiac and mediastinal contours. Port-A-Cath tip projects over the superior vena cava. Monitoring leads overlie the patient. No large area of pulmonary consolidation. No pleural effusion or pneumothorax. Thoracic spine degenerative changes. IMPRESSION: No active cardiopulmonary disease. Electronically Signed   By: Lovey Newcomer M.D.   On: 08/10/2020 15:16   DG Chest 2 View  Result Date: 08/08/2020 CLINICAL DATA:  Tachycardia EXAM: CHEST - 2 VIEW COMPARISON:  August 05, 2020 FINDINGS: The cardiomediastinal silhouette is unchanged in contour.LEFT chest port with tip terminating over the SVC. No pleural effusion. No pneumothorax. No acute pleuroparenchymal abnormality. Visualized abdomen is unremarkable. Multilevel degenerative changes of the thoracic spine. IMPRESSION: No acute cardiopulmonary abnormality. Electronically Signed   By: Valentino Saxon MD   On: 08/08/2020 10:30   CT ANGIO CHEST PE W OR WO CONTRAST  Result Date: 08/08/2020 CLINICAL DATA:  History of head and neck cancer with hypotension and shortness of breath EXAM: CT ANGIOGRAPHY CHEST WITH CONTRAST TECHNIQUE: Multidetector CT imaging of the chest was performed using the standard protocol during bolus administration of intravenous contrast. Multiplanar CT image reconstructions  and MIPs were obtained to evaluate the vascular anatomy. CONTRAST:  17mL OMNIPAQUE IOHEXOL 350 MG/ML SOLN COMPARISON:  Chest x-ray from earlier in the same day. FINDINGS: Cardiovascular: Thoracic aorta demonstrates atherosclerotic calcifications without aneurysmal dilatation or dissection. No cardiac enlargement is seen. No significant coronary calcifications are noted. The pulmonary artery shows a normal branching pattern. No focal filling defect to suggest pulmonary embolism is seen. Mediastinum/Nodes:  Thoracic inlet is within normal limits. Left chest wall port is seen. Needle is noted within the port. No significant hilar or mediastinal adenopathy is noted. The esophagus as visualized is within normal limits. Lungs/Pleura: The lungs are well aerated. No focal infiltrate or sizable effusion is seen. No pneumothorax or sizable parenchymal nodules are noted. Upper Abdomen: Visualized upper abdomen shows no acute abnormality. Musculoskeletal: Degenerative changes of the thoracic spine are noted. No acute rib abnormality is seen. Review of the MIP images confirms the above findings. IMPRESSION: No evidence of pulmonary emboli. No other focal abnormality is seen. Aortic Atherosclerosis (ICD10-I70.0). Electronically Signed   By: Inez Catalina M.D.   On: 08/08/2020 17:59   US Venous Img Lower Bilateral (DVT)  Result Date: 08/08/2020 CLINICAL DATA:  Positive D-dimer. EXAM: BILATERAL LOWER EXTREMITY VENOUS DOPPLER ULTRASOUND TECHNIQUE: Gray-scale sonography with compression, as well as color and duplex ultrasound, were performed to evaluate the deep venous system(s) from the level of the common femoral vein through the popliteal and proximal calf veins. COMPARISON:  None. FINDINGS: VENOUS Normal compressibility of the common femoral, superficial femoral, and popliteal veins, as well as the visualized calf veins. Visualized portions of profunda femoral vein and great saphenous vein unremarkable. No filling defects to  suggest DVT on grayscale or color Doppler imaging. Doppler waveforms show normal direction of venous flow, normal respiratory plasticity and response to augmentation. OTHER None. Limitations: none IMPRESSION: Negative. Electronically Signed   By: Constance Holster M.D.   On: 08/08/2020 18:27   DG Chest Port 1 View  Result Date: 08/05/2020 CLINICAL DATA:  Status post Port-A-Cath placement EXAM: PORTABLE CHEST 1 VIEW COMPARISON:  None. FINDINGS: Left-sided Port-A-Cath with the tip projecting over the SVC. No focal consolidation. No pleural effusion or pneumothorax. Heart and mediastinal contours are unremarkable. No acute osseous abnormality. IMPRESSION: Left-sided Port-A-Cath with the tip projecting over the SVC. Electronically Signed   By: Kathreen Devoid   On: 08/05/2020 11:52   DG C-Arm 1-60 Min-No Report  Result Date: 08/05/2020 Fluoroscopy was utilized by the requesting physician.  No radiographic interpretation.   ECHOCARDIOGRAM COMPLETE  Result Date: 08/09/2020    ECHOCARDIOGRAM REPORT   Patient Name:   Shannon Schroeder Date of Exam: 08/09/2020 Medical Rec #:  [id number]           Height:       66.0 in Accession #:    [id number]          Weight:       135.0 lb Date of Birth:  December 19, 1955            BSA:          1.692 m Patient Age:    4 years            BP:           178/86 mmHg Patient Gender: F                   HR:           60 bpm. Exam Location:  [facility] Procedure: 2D Echo, Color Doppler and Cardiac Doppler Indications:     Atrial Fibrillation 427.31  History:         Patient has prior history of Echocardiogram examinations, most                  recent 04/07/2019. COPD; Risk Factors:Hypertension. Bipolar  disorder.  Sonographer:     Sherrie Sport RDCS (AE) Referring Phys:  [place] Diagnosing Phys: Ida Rogue MD IMPRESSIONS  1. Left ventricular ejection fraction, by estimation, is 60 to 65%. The left ventricle has normal function. The left ventricle has no regional  wall motion abnormalities. There is mild left ventricular hypertrophy. Left ventricular diastolic parameters are consistent with Grade II diastolic dysfunction (pseudonormalization).  2. Right ventricular systolic function is normal. The right ventricular size is normal. There is normal pulmonary artery systolic pressure. The estimated right ventricular systolic pressure is 26.3 mmHg.  3. The mitral valve is normal in structure. Mild mitral valve regurgitation. FINDINGS  Left Ventricle: Left ventricular ejection fraction, by estimation, is 60 to 65%. The left ventricle has normal function. The left ventricle has no regional wall motion abnormalities. The left ventricular internal cavity size was normal in size. There is  mild left ventricular hypertrophy. Left ventricular diastolic parameters are consistent with Grade II diastolic dysfunction (pseudonormalization). Right Ventricle: The right ventricular size is normal. No increase in right ventricular wall thickness. Right ventricular systolic function is normal. There is normal pulmonary artery systolic pressure. The tricuspid regurgitant velocity is 2.02 m/s, and  with an assumed right atrial pressure of 5 mmHg, the estimated right ventricular systolic pressure is 78.5 mmHg. Left Atrium: Left atrial size was normal in size. Right Atrium: Right atrial size was normal in size. Pericardium: There is no evidence of pericardial effusion. Mitral Valve: The mitral valve is normal in structure. Mild mitral valve regurgitation. No evidence of mitral valve stenosis. Tricuspid Valve: The tricuspid valve is normal in structure. Tricuspid valve regurgitation is mild . No evidence of tricuspid stenosis. Aortic Valve: The aortic valve was not well visualized. Aortic valve regurgitation is trivial. No aortic stenosis is present. Aortic valve mean gradient measures 3.5 mmHg. Aortic valve peak gradient measures 6.4 mmHg. Aortic valve area, by VTI measures 3.73 cm. Pulmonic Valve:  The pulmonic valve was normal in structure. Pulmonic valve regurgitation is not visualized. No evidence of pulmonic stenosis. Aorta: The aortic root is normal in size and structure. Venous: The inferior vena cava is normal in size with greater than 50% respiratory variability, suggesting right atrial pressure of 3 mmHg. IAS/Shunts: No atrial level shunt detected by color flow Doppler.  LEFT VENTRICLE PLAX 2D LVIDd:         4.07 cm  Diastology LVIDs:         2.71 cm  LV e' medial:    5.77 cm/s LV PW:         1.29 cm  LV E/e' medial:  13.8 LV IVS:        0.88 cm  LV e' lateral:   9.14 cm/s LVOT diam:     2.10 cm  LV E/e' lateral: 8.7 LV SV:         86 LV SV Index:   51 LVOT Area:     3.46 cm  RIGHT VENTRICLE RV Basal diam:  3.75 cm RV S prime:     12.00 cm/s TAPSE (M-mode): 3.4 cm LEFT ATRIUM           Index       RIGHT ATRIUM           Index LA diam:      3.20 cm 1.89 cm/m  RA Area:     15.30 cm LA Vol (A2C): 27.9 ml 16.49 ml/m RA Volume:   39.50 ml  23.34 ml/m LA Vol (  A4C): 38.6 ml 22.81 ml/m  AORTIC VALVE                   PULMONIC VALVE AV Area (Vmax):    2.70 cm    PV Vmax:        0.85 m/s AV Area (Vmean):   3.01 cm    PV Peak grad:   2.9 mmHg AV Area (VTI):     3.73 cm    RVOT Peak grad: 3 mmHg AV Vmax:           126.50 cm/s AV Vmean:          87.700 cm/s AV VTI:            0.230 m AV Peak Grad:      6.4 mmHg AV Mean Grad:      3.5 mmHg LVOT Vmax:         98.60 cm/s LVOT Vmean:        76.300 cm/s LVOT VTI:          0.248 m LVOT/AV VTI ratio: 1.08  AORTA Ao Root diam: 2.60 cm MITRAL VALVE               TRICUSPID VALVE MV Area (PHT): 3.63 cm    TR Peak grad:   16.3 mmHg MV Decel Time: 209 msec    TR Vmax:        202.00 cm/s MV E velocity: 79.70 cm/s MV A velocity: 71.60 cm/s  SHUNTS MV E/A ratio:  1.11        Systemic VTI:  0.25 m                            Systemic Diam: 2.10 cm Ida Rogue MD Electronically signed by Ida Rogue MD Signature Date/Time: 08/09/2020/2:59:39 PM    Final        ASSESSMENT & PLAN:  1. 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[place])   2. Encounter for antineoplastic chemotherapy   3. Neoplasm related pain   4. History of atrial fibrillation   5. Weight loss   6. Hyponatremia   7. 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  Labs are reviewed and discussed with patient. Counts are acceptable to proceed with cycle 3 cisplatin today.  Continue Peridex mouthwash.  Dysphagia maybe due to radiation side effects. Recommend her to eat soft consistency food.   # Hypotension Recommend patient to hold hydralazine.  Just stay on metoprolol and lisinopril. Advise her to measure BP at home.  IV NS 1L x 1. Repeat hydration session in 2-3 days.   ##neoplasm induced pain,  Continue percocet q4 hours as needed for pain.  I reviewed her fentanyl patch 13mcg/h Q72 hours. .  Continue garbapentine 300mg  TID.continue current regimen.   # constipation, recommend colace 100mg  daily and use miralax daily PRN  # Weight loss  Continue nutrition supplementation. Follow up with nutritionist.  #Atrial fibrillation, currently in sinus rhythm.  Continue beta-blocker.   Continue Eliquis. All questions were answered. The patient knows to call the clinic with any problems questions or concerns.   Return of visit: 1 week  Earlie Server, MD, PhD Hematology Oncology [facility] Pager- [phone] 08/26/2020

## 2020-08-26 NOTE — Progress Notes (Signed)
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  Patient receiving concurrent chemotherapy and radiation therapy.    Spoke with patient in infusion.  Patient reports that she is eating pudding, jello, ice cream, bouillon soup.  Drinks ensure plus/boost plus 3-4 times per day.  Reports trouble swallowing solids.  Reports no appetite.      Medications: reviewed  Labs: reviewed  Anthropometrics:   Weight 120 lb 1.6 oz today decreased from 124 lb 8 oz on 10/11 132 lb 3 oz on 9/23  9% weight loss in the last month, significant  UBW of 170-180 lb in March 2021   NUTRITION DIAGNOSIS: Inadequate oral intake continues  MALNUTRITION DIAGNOSIS: Patient meet criteria for severe malnutrition in acute illness as evidenced by 9% weight loss in the last month and eating less than or equal to 50% of estimated needs for > or equal to 5 days.    INTERVENTION:  Recommend consideration of feeding tube placement with significant weight loss in the last month, unable to meet needs orally and receiving chemotherapy and radiation therapy. Message sent to MD Discussed chopping, grinding, pureeing foods as option for ease of swallowing. Recommend SLP evaluation. Recommend if patient able to drink 5-6 ensure plus shakes per day to better meet nutritional needs.  Case of ensure plus given to patient today.  Contact information provided     MONITORING, EVALUATION, GOAL: weight trends, intake   NEXT VISIT: Nov 4th phone f/u  Dara Beidleman B. Zenia Resides, [place], [place] Registered Dietitian [phone] (mobile)

## 2020-08-26 NOTE — Progress Notes (Signed)
Patient reports she does not have an appetite and starting to have difficulty swallowing solids.  Does drink 3-4 meal replacement supplements a day.  Also having constipation with partial bowel movement this morning and takes Miralax as needed.

## 2020-08-27 ENCOUNTER — Ambulatory Visit
Admission: RE | Admit: 2020-08-27 | Discharge: 2020-08-27 | Disposition: A | Discharge status: Home / Self Care | Payer: Medicaid Other | Source: Ambulatory Visit | Attending: Radiation Oncology | Admitting: Radiation Oncology

## 2020-08-27 DIAGNOSIS — Z510 Encounter for antineoplastic radiation therapy: Secondary | ICD-10-CM | POA: Diagnosis not present

## 2020-08-28 ENCOUNTER — Ambulatory Visit: Payer: Medicaid Other

## 2020-08-28 ENCOUNTER — Other Ambulatory Visit: Payer: Self-pay

## 2020-08-28 DIAGNOSIS — C310 Malignant neoplasm of maxillary sinus: Secondary | ICD-10-CM

## 2020-08-28 DIAGNOSIS — C760 Malignant neoplasm of head, face and neck: Secondary | ICD-10-CM

## 2020-08-28 NOTE — Telephone Encounter (Signed)
Received 1 slide from [place]. Specimen taken to histology lab and signed in. Specimen given to [facility]. Included pathology report and slide consult agreement with specimen.

## 2020-08-29 ENCOUNTER — Other Ambulatory Visit: Payer: Self-pay

## 2020-08-29 ENCOUNTER — Ambulatory Visit: Payer: Medicaid Other

## 2020-08-29 ENCOUNTER — Inpatient Hospital Stay: Payer: Medicaid Other

## 2020-08-29 VITALS — BP 133/74 | HR 85 | Temp 99.0°F | Resp 18

## 2020-08-29 DIAGNOSIS — E860 Dehydration: Secondary | ICD-10-CM

## 2020-08-29 DIAGNOSIS — E871 Hypo-osmolality and hyponatremia: Secondary | ICD-10-CM

## 2020-08-29 DIAGNOSIS — Z510 Encounter for antineoplastic radiation therapy: Secondary | ICD-10-CM | POA: Diagnosis not present

## 2020-08-29 MED ORDER — SODIUM CHLORIDE 0.9% FLUSH
10.0000 mL | Freq: Once | INTRAVENOUS | Status: AC | PRN
  Administered 2020-08-29: 10 mL
  Filled 2020-08-29: qty 10

## 2020-08-29 MED ORDER — SODIUM CHLORIDE 0.9 % IV SOLN
Freq: Once | INTRAVENOUS | Status: AC
  Filled 2020-08-29: qty 250

## 2020-08-29 MED ORDER — HEPARIN SOD (PORK) LOCK FLUSH 100 UNIT/ML IV SOLN
500.0000 [IU] | Freq: Once | INTRAVENOUS | Status: AC | PRN
  Administered 2020-08-29: 500 [IU]
  Filled 2020-08-29: qty 5

## 2020-08-29 NOTE — Progress Notes (Signed)
In [facility] for scheduled IVF's. Received 1 liter of NS. Pt drinking gator aid. States she is trying to drink 5-6 ensure per day plus extra fluids. On a radiation break until Monday. Discharged from clinic. Accompanied patient to 1st floor where she is wanting to wait for her ride. Pt is ambulatory.

## 2020-08-30 ENCOUNTER — Ambulatory Visit: Payer: Medicaid Other

## 2020-09-02 ENCOUNTER — Other Ambulatory Visit: Payer: Self-pay

## 2020-09-02 ENCOUNTER — Inpatient Hospital Stay: Payer: Medicaid Other

## 2020-09-02 ENCOUNTER — Ambulatory Visit
Admission: RE | Admit: 2020-09-02 | Discharge: 2020-09-02 | Disposition: A | Discharge status: Home / Self Care | Payer: Medicaid Other | Source: Ambulatory Visit | Attending: Radiation Oncology | Admitting: Radiation Oncology

## 2020-09-02 ENCOUNTER — Encounter: Payer: Self-pay | Admitting: Oncology

## 2020-09-02 ENCOUNTER — Inpatient Hospital Stay (HOSPITAL_BASED_OUTPATIENT_CLINIC_OR_DEPARTMENT_OTHER): Payer: Medicaid Other | Admitting: Oncology

## 2020-09-02 VITALS — BP 95/63 | HR 96 | Temp 98.1°F | Resp 18 | Wt 113.4 lb

## 2020-09-02 VITALS — BP 134/73 | HR 76 | Resp 16

## 2020-09-02 DIAGNOSIS — Z510 Encounter for antineoplastic radiation therapy: Secondary | ICD-10-CM | POA: Diagnosis not present

## 2020-09-02 DIAGNOSIS — E860 Dehydration: Secondary | ICD-10-CM

## 2020-09-02 DIAGNOSIS — E871 Hypo-osmolality and hyponatremia: Secondary | ICD-10-CM

## 2020-09-02 DIAGNOSIS — J189 Pneumonia, unspecified organism: Secondary | ICD-10-CM | POA: Insufficient documentation

## 2020-09-02 DIAGNOSIS — Z7901 Long term (current) use of anticoagulants: Secondary | ICD-10-CM | POA: Insufficient documentation

## 2020-09-02 DIAGNOSIS — G893 Neoplasm related pain (acute) (chronic): Secondary | ICD-10-CM | POA: Diagnosis not present

## 2020-09-02 DIAGNOSIS — F319 Bipolar disorder, unspecified: Secondary | ICD-10-CM | POA: Insufficient documentation

## 2020-09-02 DIAGNOSIS — E876 Hypokalemia: Secondary | ICD-10-CM | POA: Insufficient documentation

## 2020-09-02 DIAGNOSIS — Z79891 Long term (current) use of opiate analgesic: Secondary | ICD-10-CM | POA: Insufficient documentation

## 2020-09-02 DIAGNOSIS — C760 Malignant neoplasm of head, face and neck: Secondary | ICD-10-CM

## 2020-09-02 DIAGNOSIS — R1310 Dysphagia, unspecified: Secondary | ICD-10-CM | POA: Insufficient documentation

## 2020-09-02 DIAGNOSIS — K5900 Constipation, unspecified: Secondary | ICD-10-CM | POA: Insufficient documentation

## 2020-09-02 DIAGNOSIS — I959 Hypotension, unspecified: Secondary | ICD-10-CM | POA: Insufficient documentation

## 2020-09-02 DIAGNOSIS — Z5111 Encounter for antineoplastic chemotherapy: Secondary | ICD-10-CM | POA: Insufficient documentation

## 2020-09-02 DIAGNOSIS — I119 Hypertensive heart disease without heart failure: Secondary | ICD-10-CM | POA: Insufficient documentation

## 2020-09-02 DIAGNOSIS — R634 Abnormal weight loss: Secondary | ICD-10-CM | POA: Insufficient documentation

## 2020-09-02 DIAGNOSIS — I4891 Unspecified atrial fibrillation: Secondary | ICD-10-CM | POA: Insufficient documentation

## 2020-09-02 DIAGNOSIS — C310 Malignant neoplasm of maxillary sinus: Secondary | ICD-10-CM | POA: Insufficient documentation

## 2020-09-02 DIAGNOSIS — J449 Chronic obstructive pulmonary disease, unspecified: Secondary | ICD-10-CM | POA: Insufficient documentation

## 2020-09-02 DIAGNOSIS — R531 Weakness: Secondary | ICD-10-CM | POA: Insufficient documentation

## 2020-09-02 DIAGNOSIS — I700 Atherosclerosis of aorta: Secondary | ICD-10-CM | POA: Diagnosis not present

## 2020-09-02 DIAGNOSIS — F17210 Nicotine dependence, cigarettes, uncomplicated: Secondary | ICD-10-CM | POA: Insufficient documentation

## 2020-09-02 DIAGNOSIS — M1990 Unspecified osteoarthritis, unspecified site: Secondary | ICD-10-CM | POA: Insufficient documentation

## 2020-09-02 DIAGNOSIS — Z79899 Other long term (current) drug therapy: Secondary | ICD-10-CM | POA: Insufficient documentation

## 2020-09-02 LAB — COMPREHENSIVE METABOLIC PANEL
ALT: 17 U/L (ref 0–44)
AST: 20 U/L (ref 15–41)
Albumin: 3.1 g/dL — ABNORMAL LOW (ref 3.5–5.0)
Alkaline Phosphatase: 92 U/L (ref 38–126)
Anion gap: 13 (ref 5–15)
BUN: 13 mg/dL (ref 8–23)
CO2: 30 mmol/L (ref 22–32)
Calcium: 8.7 mg/dL — ABNORMAL LOW (ref 8.9–10.3)
Chloride: 91 mmol/L — ABNORMAL LOW (ref 98–111)
Creatinine, Ser: 0.47 mg/dL (ref 0.44–1.00)
GFR, Estimated: >60 mL/min (ref >60)
Glucose, Bld: 122 mg/dL — ABNORMAL HIGH (ref 70–99)
Potassium: 2.7 mmol/L — CL (ref 3.5–5.1)
Sodium: 134 mmol/L — ABNORMAL LOW (ref 135–145)
Total Bilirubin: 0.7 mg/dL (ref 0.3–1.2)
Total Protein: 7 g/dL (ref 6.5–8.1)

## 2020-09-02 LAB — CBC WITH DIFFERENTIAL/PLATELET
Abs Immature Granulocytes: 0.05 10*3/uL (ref 0.00–0.07)
Basophils Absolute: 0.1 10*3/uL (ref 0.0–0.1)
Basophils Relative: 1 %
Eosinophils Absolute: 0.1 10*3/uL (ref 0.0–0.5)
Eosinophils Relative: 1 %
HCT: 33 % — ABNORMAL LOW (ref 36.0–46.0)
Hemoglobin: 11.4 g/dL — ABNORMAL LOW (ref 12.0–15.0)
Immature Granulocytes: 1 %
Lymphocytes Relative: 4 %
Lymphs Abs: 0.5 10*3/uL — ABNORMAL LOW (ref 0.7–4.0)
MCH: 33.5 pg (ref 26.0–34.0)
MCHC: 34.5 g/dL (ref 30.0–36.0)
MCV: 97.1 fL (ref 80.0–100.0)
Monocytes Absolute: 1 10*3/uL (ref 0.1–1.0)
Monocytes Relative: 9 %
Neutro Abs: 9.1 10*3/uL — ABNORMAL HIGH (ref 1.7–7.7)
Neutrophils Relative %: 84 %
Platelets: 448 10*3/uL — ABNORMAL HIGH (ref 150–400)
RBC: 3.4 MIL/uL — ABNORMAL LOW (ref 3.87–5.11)
RDW: 12.5 % (ref 11.5–15.5)
WBC: 10.8 10*3/uL — ABNORMAL HIGH (ref 4.0–10.5)
nRBC: 0 % (ref 0.0–0.2)

## 2020-09-02 LAB — MAGNESIUM: Magnesium: 1.8 mg/dL (ref 1.7–2.4)

## 2020-09-02 MED ORDER — SODIUM CHLORIDE 0.9 % IV SOLN
Freq: Once | INTRAVENOUS | Status: AC
  Filled 2020-09-02: qty 250

## 2020-09-02 MED ORDER — HEPARIN SOD (PORK) LOCK FLUSH 100 UNIT/ML IV SOLN
INTRAVENOUS | Status: AC
  Filled 2020-09-02: qty 5

## 2020-09-02 MED ORDER — SODIUM CHLORIDE 0.9 % IV SOLN
40.0000 mg/m2 | Freq: Once | INTRAVENOUS | Status: AC
  Administered 2020-09-02: 66 mg via INTRAVENOUS
  Filled 2020-09-02: qty 66

## 2020-09-02 MED ORDER — SODIUM CHLORIDE 0.9 % IV SOLN
150.0000 mg | Freq: Once | INTRAVENOUS | Status: AC
  Administered 2020-09-02: 150 mg via INTRAVENOUS
  Filled 2020-09-02: qty 150

## 2020-09-02 MED ORDER — POTASSIUM CHLORIDE CRYS ER 20 MEQ PO TBCR: 20.0000 meq | EXTENDED_RELEASE_TABLET | Freq: Every day | ORAL | 0 refills | Status: DC

## 2020-09-02 MED ORDER — SODIUM CHLORIDE 0.9 % IV SOLN
10.0000 mg | Freq: Once | INTRAVENOUS | Status: AC
  Administered 2020-09-02: 10 mg via INTRAVENOUS
  Filled 2020-09-02: qty 10

## 2020-09-02 MED ORDER — ACYCLOVIR 400 MG PO TABS: 400.0000 mg | ORAL_TABLET | Freq: Two times a day (BID) | ORAL | 0 refills | Status: DC

## 2020-09-02 MED ORDER — SODIUM CHLORIDE 0.9 % IV SOLN
Freq: Once | INTRAVENOUS | Status: AC
  Filled 2020-09-02: qty 20

## 2020-09-02 MED ORDER — PALONOSETRON HCL INJECTION 0.25 MG/5ML
0.2500 mg | Freq: Once | INTRAVENOUS | Status: AC
  Administered 2020-09-02: 0.25 mg via INTRAVENOUS
  Filled 2020-09-02: qty 5

## 2020-09-02 MED ORDER — HEPARIN SOD (PORK) LOCK FLUSH 100 UNIT/ML IV SOLN
500.0000 [IU] | Freq: Once | INTRAVENOUS | Status: AC | PRN
  Administered 2020-09-02: 500 [IU]
  Filled 2020-09-02: qty 5

## 2020-09-02 NOTE — Progress Notes (Signed)
Per Benjamine Mola, RN, per Dr. Tasia Catchings, okay to proceed with treatment. Plan modified to have 40 mEq of Potassium with treatment today.   Patient completed pre-hydration and voided 150cc. Dr. Tasia Catchings and team made aware. Per Dr. Tasia Catchings, patient to get additional liter of fluids today (500cc before chemo and 500cc after). Okay to start premeds without additional urine output.   1610: Dr. Tasia Catchings made aware patient has yet to void anymore since the 150 cc this AM and patient denies any urge to urinate. Encouraged patient to urinate, patient voided 175 cc. Dr. Tasia Catchings made aware. Per Dr. Tasia Catchings, encourage oral hydration at home. Patient to come back to clinic tomorrow AM for IV hydration as well.

## 2020-09-02 NOTE — Progress Notes (Signed)
Hematology/Oncology follow up note [facility] [phone] [phone]   Patient Care Team: Pcp, No as PCP - General Rockey Situ, Kathlene November, MD as PCP - Cardiology (Cardiology) Earlie Server, MD as Consulting Physician (Oncology)  CHIEF COMPLAINTS/REASON FOR VISIT:  Follow-up for head and neck cancer  HISTORY OF PRESENTING ILLNESS:   Shannon Schroeder is a  64 y.o.  female with PMH listed below was seen in consultation at the request of ER Dr. [name] for evaluation of abnormal CT scan.  07/10/2020 she presented emergency room for evaluation of right-sided facial swelling and pain in the right side of her mouth for the past 6 weeks.  It started in her mouth with a small ulcer which progressively got worse.  She wears denture.  Not able to chew well.  She eats soft food. She was found to have right upper gum also with yellowish discharge noted. 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  Avoid piloting.,  Right orbital apex, also the right soft palate and palate and tonsil.  Furthermore there is evidence of intracranial extension along the right V2 at the foramen rotundum Small suspicious right level 1A lymph node measuring 7 mm.  Separate small indeterminate but suspicious enhancing soft tissue nodule of the left sublingual space.  Patient was referred to oncology for further evaluation management. Patient denies fever, chills, nausea vomiting diarrhea shortness of breath or cough.  Reports 10 out of 10 severe pain of right face and numbness.  She also has headache. Reports unintentional weight loss  She reports no contactable family members.  She is in the process of pointing her roommate [name] to be her power of attorney.  She also wants to record today's conversation for her roommate  [name]. Patient has 50-pack-year smoking history.  # seen by ENT Dr.Juengel and had a biopsy. Biopsy was sent to [facility] and the final pathology was positive for invasive squamous cell carcinoma, well-differentiated, keratinizing. 07/17/2020, brain MRI showed large right facial mass with right maxillary erosion involving the maxillary sinus, hard palate and pterygoid plates and muscles.  Perineural spreading along the multiple branches of the maxillary division of the right trigeminal nerve at the infraorbital canal, pterygopalatine fossa and foramen rotundum. 07/17/2020, PET scan showed Intense FDG uptake is associated with the large enhancing tumor which involves the right maxilla, right nasal cavity, right pterygoid palatine fossa, right orbital apex and right soft tissue palate and palatine tonsil. 2. Mild nonspecific FDG uptake is associated with the recently characterized suspicious right level 1A lymph node which measures 7 mm and has an SUV max of 2.12.  No signs of distant metastasis.  Aortic atherosclerosis.  Patient also reports a history of hypertension, dysrhythmia, history of V. tach.  Patient previously was on lisinopril, atenolol, amlodipine for which she is no longer taking at this point due to lack of medication coverage.  She also does not have any primary care provider. 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  On average she says she is takes 2 to 3 pills/day.  She also takes gabapentin 300 mg 3 times daily  # new onset of atrial fibrillation with rapid ventricular  response.  Patient was started on metoprolol.  Eliquis for anticoagulation. INTERVAL HISTORY Shannon Schroeder is a 64 y.o. female who has above history reviewed by me today presents for follow up visit for management of head and neck  cancer. Problems and complaints are listed below: She feels weak today, but strongly desires to proceed with chemotherapy as she usually feels better after each treatments. No nausea vomiting diarrhea. She has dysphagia with solid food.  No difficulty eating pudding, or fluid.  She drinks nutrition supplementation. Patient's Norvasc and hydralazine have been held due to hypotension.  She took lisinopril and metoprolol this morning.  Denies any lightheadedness.  Blood pressure has been running borderline. Right facial pain is controlled. Some cough not productive. No fever or chills .  She lost weight since last visit.  Review of Systems  Constitutional: Positive for fatigue and unexpected weight change. Negative for appetite change, chills and fever.  HENT:   Negative for hearing loss, mouth sores and voice change.        Facial swelling, pain and numbness  Eyes: Negative for eye problems.  Respiratory: Negative for chest tightness and cough.   Cardiovascular: Negative for chest pain and palpitations.  Gastrointestinal: Positive for constipation. Negative for abdominal distention, abdominal pain, blood in stool, nausea and vomiting.       Dysphagia  Endocrine: Negative for hot flashes.  Genitourinary: Negative for difficulty urinating and frequency.   Musculoskeletal: Negative for arthralgias.  Skin: Negative for itching and rash.  Neurological: Negative for extremity weakness and headaches.  Hematological: Negative for adenopathy.  Psychiatric/Behavioral: Negative for confusion.    MEDICAL HISTORY:  Past Medical History:  Diagnosis Date  . 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   Marland Kitchen Anxiety   . Arthritis   . Bipolar disorder ([place])   . COPD (chronic obstructive pulmonary disease) ([place])    NO INHALERS  . Depression   . Dysrhythmia    H/O V TACH  . Head and neck cancer ([place]) 07/29/2020  . Headache    H/O MIGRAINES  .  Hypertension   . Seizures ([place])    X1 AFTER FALL    SURGICAL HISTORY: Past Surgical History:  Procedure Laterality Date  . ABDOMINAL HYSTERECTOMY     partial  . BREAST SURGERY     biopsy  . CARDIAC CATHETERIZATION     X 2  . CARPAL TUNNEL RELEASE Right   . CARPECTOMY HAND Right   . CATARACT EXTRACTION W/PHACO Left 03/08/2018   Procedure: CATARACT EXTRACTION PHACO AND INTRAOCULAR LENS PLACEMENT (IOC);  Surgeon: Birder Robson, MD;  Location: [facility] ORS;  Service: Ophthalmology;  Laterality: Left;  [place] 00:55 AP% 13.5 CDE 7.52 Fluid pack lot # [id number]  . CATARACT EXTRACTION W/PHACO Right 04/05/2018   Procedure: CATARACT EXTRACTION PHACO AND INTRAOCULAR LENS PLACEMENT (IOC);  Surgeon: Birder Robson, MD;  Location: [facility] ORS;  Service: Ophthalmology;  Laterality: Right;  [place] 00:36 AP% 15.9 CDE 5.72 Fluid pack lot # [id number]  . CEREBRAL ANEURYSM REPAIR    . CHOLECYSTECTOMY    . COLONOSCOPY WITH PROPOFOL N/A 04/26/2019   Procedure: COLONOSCOPY WITH PROPOFOL;  Surgeon: Jonathon Bellows, MD;  Location: [facility] ENDOSCOPY;  Service: Gastroenterology;  Laterality: N/A;  . PORTACATH PLACEMENT Left 08/05/2020   Procedure: INSERTION PORT-A-CATH;  Surgeon: Herbert Pun, MD;  Location: [facility] ORS;  Service: General;  Laterality: Left;  . TUBAL LIGATION      SOCIAL HISTORY: Social History  Socioeconomic History  . Marital status: Divorced    Spouse name: Not on file  . Number of children: Not on file  . Years of education: 40  . Highest education level: Bachelor's degree (e.g., BA, AB, BS)  Occupational History  . Occupation: farmer  Tobacco Use  . Smoking status: Current Every Day Smoker    Packs/day: 1.00    Years: 50.00    Pack years: 50.00    Types: Cigarettes  . Smokeless tobacco: Never Used  Vaping Use  . Vaping Use: Never used  Substance and Sexual Activity  . Alcohol use: No  . Drug use: No  . Sexual activity: Not Currently  Other Topics Concern  . Not on file  Social  History Narrative   Pt lives at [facility].    Social Determinants of Health   Financial Resource Strain:   . Difficulty of Paying Living Expenses: Not on file  Food Insecurity:   . Worried About Charity fundraiser in the Last Year: Not on file  . Ran Out of Food in the Last Year: Not on file  Transportation Needs:   . Lack of Transportation (Medical): Not on file  . Lack of Transportation (Non-Medical): Not on file  Physical Activity:   . Days of Exercise per Week: Not on file  . Minutes of Exercise per Session: Not on file  Stress:   . Feeling of Stress : Not on file  Social Connections:   . Frequency of Communication with Friends and Family: Not on file  . Frequency of Social Gatherings with Friends and Family: Not on file  . Attends Religious Services: Not on file  . Active Member of Clubs or Organizations: Not on file  . Attends Archivist Meetings: Not on file  . Marital Status: Not on file  Intimate Partner Violence:   . Fear of Current or Ex-Partner: Not on file  . Emotionally Abused: Not on file  . Physically Abused: Not on file  . Sexually Abused: Not on file    FAMILY HISTORY: Family History  Problem Relation Age of Onset  . Hypertension Mother   . Heart failure Mother   . Hypertension Brother   . Stroke Brother   . Hypertension Son   . Emphysema Maternal Aunt   . Hypertension Paternal Aunt   . Hypertension Paternal Uncle   . Hypertension Maternal Grandmother   . Hypertension Paternal Grandmother     ALLERGIES:  is allergic to aspirin, belladonna alkaloids, fluoxetine hcl, naproxen, phenobarbital, and prozac [fluoxetine hcl].  MEDICATIONS:  Current Outpatient Medications  Medication Sig Dispense Refill  . acetaminophen (TYLENOL 8 HOUR ARTHRITIS PAIN) 650 MG CR tablet Take 1,300 mg by mouth daily as needed (Pain).     Marland Kitchen apixaban (ELIQUIS) 5 MG TABS tablet Take 1 tablet (5 mg total) by mouth 2 (two) times daily. 60 tablet 0  . b complex  vitamins capsule Take 1 capsule by mouth daily. Gummie    . chlorhexidine (PERIDEX) 0.12 % solution Use as directed 10 mLs in the mouth or throat 2 (two) times daily. Swish and spit 473 mL 1  . dexamethasone (DECADRON) 4 MG tablet Take 1 tablet (4 mg total) by mouth daily. 7 tablet 0  . docusate sodium (COLACE) 100 MG capsule Take 1 capsule (100 mg total) by mouth daily. 30 capsule 1  . feeding supplement, ENSURE ENLIVE, (ENSURE ENLIVE) LIQD Take 237 mLs by mouth 3 (three) times daily between meals. 237 mL  12  . fentaNYL (DURAGESIC) 25 MCG/HR Place 1 patch onto the skin every 3 (three) days. 7 patch 0  . gabapentin (NEURONTIN) 300 MG capsule Take 1 capsule (300 mg total) by mouth 3 (three) times daily. 90 capsule 0  . Ginkgo Biloba (GINKOBA PO) Take 1 tablet by mouth daily.     . hydrALAZINE (APRESOLINE) 25 MG tablet Take 1 tablet (25 mg total) by mouth 2 (two) times daily. 60 tablet 1  . ibuprofen (ADVIL) 400 MG tablet Take 400 mg by mouth every 6 (six) hours as needed for mild pain or moderate pain. Liquid Gel    . lidocaine-prilocaine (EMLA) cream Apply to affected area once (Patient taking differently: Apply 1 application topically as needed [facility]). Apply to affected area once) 30 g 3  . lisinopril (ZESTRIL) 40 MG tablet Take 1 tablet (40 mg total) by mouth daily. 30 tablet 1  . Melatonin 10 MG TABS Take 10 mg by mouth at bedtime as needed (sleep).     . metoprolol tartrate (LOPRESSOR) 50 MG tablet Take 1 tablet (50 mg total) by mouth 2 (two) times daily. 60 tablet 1  . Multiple Vitamins-Minerals (CENTRUM SILVER PO) Take 1 tablet by mouth daily. Gummie    . nicotine (NICODERM CQ - DOSED IN MG/24 HOURS) 21 mg/24hr patch Place 1 patch (21 mg total) onto the skin daily. (Patient not taking: Reported on 08/19/2020) 28 patch 0  . oxyCODONE-acetaminophen (PERCOCET) 5-325 MG tablet Take 1 tablet by mouth every 4 (four) hours as needed for severe pain. 30 tablet 0  . polyethylene glycol powder  (GLYCOLAX/MIRALAX) 17 GM/SCOOP powder Take 1 Container by mouth daily as needed.    . prochlorperazine (COMPAZINE) 10 MG tablet Take 1 tablet (10 mg total) by mouth every 6 (six) hours as needed (Nausea or vomiting). 30 tablet 1   No current facility-administered medications for this visit.     PHYSICAL EXAMINATION: ECOG PERFORMANCE STATUS: 1 - Symptomatic but completely ambulatory Vitals:   09/02/20 0913  BP: 95/63  Pulse: 96  Resp: 18  Temp: 98.1 F (36.7 C)   Filed Weights   09/02/20 0913  Weight: 113 lb 6.4 oz (51.4 kg)    Physical Exam Constitutional:      General: She is not in acute distress. HENT:     Head: Normocephalic and atraumatic.     Nose:     Comments: Right facial swelling    Mouth/Throat:     Comments: No thrush.  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 Crusted lesion on her lower lip Eyes:     General: No scleral icterus. Cardiovascular:     Rate and Rhythm: Normal rate and regular rhythm.     Heart sounds: Normal heart sounds.  Pulmonary:     Effort: Pulmonary effort is normal. No respiratory distress.     Breath sounds: No wheezing.  Abdominal:     General: Bowel sounds are normal. There is no distension.     Palpations: Abdomen is soft.  Musculoskeletal:        General: No deformity. Normal range of motion.     Cervical back: Normal range of motion and neck supple.  Skin:    General: Skin is warm and dry.     Findings: No erythema or rash.  Neurological:     Mental Status: She is alert and oriented to person, place, and time. Mental  status is at baseline.     Cranial Nerves: No cranial nerve deficit.     Coordination: Coordination normal.  Psychiatric:        Mood and Affect: Mood normal.     LABORATORY DATA:  I have reviewed the data as listed Lab Results  Component Value Date   WBC 15.6 (H) 08/26/2020   HGB 10.7 (L)  08/26/2020   HCT 31.4 (L) 08/26/2020   MCV 97.2 08/26/2020   PLT 545 (H) 08/26/2020   Recent Labs    07/10/20 1311 07/10/20 1311 07/12/20 1204 08/08/20 0821 08/19/20 0820 08/22/20 1255 08/26/20 0833  NA 136   < > 137   < > 130* 134* 131*  K 2.9*   < > 4.0   < > 3.5 3.9 3.5  CL 97*   < > 98   < > 92* 97* 94*  CO2 28   < > 27   < > 28 27 27   GLUCOSE 100*   < > 114*   < > 116* 118* 109*  BUN 13   < > 8   < > 17 16 15   CREATININE 0.43*   < > 0.42*   < > 0.46 0.42* 0.43*  CALCIUM 8.9   < > 9.0   < > 8.4* 8.4* 8.3*  GFRNONAA >60   < > >60   < > >60 >60 >60  GFRAA >60  --  >60  --   --   --   --   PROT 7.0  --   --    < > 6.8 6.7 6.8  ALBUMIN 3.7  --   --    < > 2.9* 3.0* 3.0*  AST 20  --   --    < > 29 18 22   ALT 16  --   --    < > 32 26 29  ALKPHOS 106  --   --    < > 134* 98 104  BILITOT 0.5  --   --    < > 0.4 0.5 0.6   < > = values in this interval not displayed.   Iron/TIBC/Ferritin/ %Sat    Component Value Date/Time   IRON 37 05/03/2018 1826   FERRITIN 52 05/03/2018 1826      RADIOGRAPHIC STUDIES: I have personally reviewed the radiological images as listed and agreed with the findings in the report. DG Chest 2 View  Result Date: 08/10/2020 CLINICAL DATA:  Dizziness. EXAM: CHEST - 2 VIEW COMPARISON:  Chest CT 08/08/2020 FINDINGS: Stable cardiac and mediastinal contours. Port-A-Cath tip projects over the superior vena cava. Monitoring leads overlie the patient. No large area of pulmonary consolidation. No pleural effusion or pneumothorax. Thoracic spine degenerative changes. IMPRESSION: No active cardiopulmonary disease. Electronically Signed   By: Lovey Newcomer M.D.   On: 08/10/2020 15:16   DG Chest 2 View  Result Date: 08/08/2020 CLINICAL DATA:  Tachycardia EXAM: CHEST - 2 VIEW COMPARISON:  August 05, 2020 FINDINGS: The cardiomediastinal silhouette is unchanged in contour.LEFT chest port with tip terminating over the SVC. No pleural effusion. No pneumothorax. No acute  pleuroparenchymal abnormality. Visualized abdomen is unremarkable. Multilevel degenerative changes of the thoracic spine. IMPRESSION: No acute cardiopulmonary abnormality. Electronically Signed   By: Valentino Saxon MD   On: 08/08/2020 10:30   CT ANGIO CHEST PE W OR WO CONTRAST  Result Date: 08/08/2020 CLINICAL DATA:  History of head and neck cancer with hypotension and shortness of breath EXAM: CT ANGIOGRAPHY  CHEST WITH CONTRAST TECHNIQUE: Multidetector CT imaging of the chest was performed using the standard protocol during bolus administration of intravenous contrast. Multiplanar CT image reconstructions and MIPs were obtained to evaluate the vascular anatomy. CONTRAST:  2mL OMNIPAQUE IOHEXOL 350 MG/ML SOLN COMPARISON:  Chest x-ray from earlier in the same day. FINDINGS: Cardiovascular: Thoracic aorta demonstrates atherosclerotic calcifications without aneurysmal dilatation or dissection. No cardiac enlargement is seen. No significant coronary calcifications are noted. The pulmonary artery shows a normal branching pattern. No focal filling defect to suggest pulmonary embolism is seen. Mediastinum/Nodes: Thoracic inlet is within normal limits. Left chest wall port is seen. Needle is noted within the port. No significant hilar or mediastinal adenopathy is noted. The esophagus as visualized is within normal limits. Lungs/Pleura: The lungs are well aerated. No focal infiltrate or sizable effusion is seen. No pneumothorax or sizable parenchymal nodules are noted. Upper Abdomen: Visualized upper abdomen shows no acute abnormality. Musculoskeletal: Degenerative changes of the thoracic spine are noted. No acute rib abnormality is seen. Review of the MIP images confirms the above findings. IMPRESSION: No evidence of pulmonary emboli. No other focal abnormality is seen. Aortic Atherosclerosis (ICD10-I70.0). Electronically Signed   By: Inez Catalina M.D.   On: 08/08/2020 17:59   US Venous Img Lower Bilateral  (DVT)  Result Date: 08/08/2020 CLINICAL DATA:  Positive D-dimer. EXAM: BILATERAL LOWER EXTREMITY VENOUS DOPPLER ULTRASOUND TECHNIQUE: Gray-scale sonography with compression, as well as color and duplex ultrasound, were performed to evaluate the deep venous system(s) from the level of the common femoral vein through the popliteal and proximal calf veins. COMPARISON:  None. FINDINGS: VENOUS Normal compressibility of the common femoral, superficial femoral, and popliteal veins, as well as the visualized calf veins. Visualized portions of profunda femoral vein and great saphenous vein unremarkable. No filling defects to suggest DVT on grayscale or color Doppler imaging. Doppler waveforms show normal direction of venous flow, normal respiratory plasticity and response to augmentation. OTHER None. Limitations: none IMPRESSION: Negative. Electronically Signed   By: Constance Holster M.D.   On: 08/08/2020 18:27   DG Chest Port 1 View  Result Date: 08/05/2020 CLINICAL DATA:  Status post Port-A-Cath placement EXAM: PORTABLE CHEST 1 VIEW COMPARISON:  None. FINDINGS: Left-sided Port-A-Cath with the tip projecting over the SVC. No focal consolidation. No pleural effusion or pneumothorax. Heart and mediastinal contours are unremarkable. No acute osseous abnormality. IMPRESSION: Left-sided Port-A-Cath with the tip projecting over the SVC. Electronically Signed   By: Kathreen Devoid   On: 08/05/2020 11:52   DG C-Arm 1-60 Min-No Report  Result Date: 08/05/2020 Fluoroscopy was utilized by the requesting physician.  No radiographic interpretation.   ECHOCARDIOGRAM COMPLETE  Result Date: 08/09/2020    ECHOCARDIOGRAM REPORT   Patient Name:   JERILYNN FELDMEIER Date of Exam: 08/09/2020 Medical Rec #:  [id number]           Height:       66.0 in Accession #:    [id number]          Weight:       135.0 lb Date of Birth:  05-Mar-1956            BSA:          1.692 m Patient Age:    40 years            BP:           178/86 mmHg  Patient Gender: F  HR:           60 bpm. Exam Location:  [facility] Procedure: 2D Echo, Color Doppler and Cardiac Doppler Indications:     Atrial Fibrillation 427.31  History:         Patient has prior history of Echocardiogram examinations, most                  recent 04/07/2019. COPD; Risk Factors:Hypertension. Bipolar                  disorder.  Sonographer:     Sherrie Sport RDCS (AE) Referring Phys:  [place] Diagnosing Phys: Ida Rogue MD IMPRESSIONS  1. Left ventricular ejection fraction, by estimation, is 60 to 65%. The left ventricle has normal function. The left ventricle has no regional wall motion abnormalities. There is mild left ventricular hypertrophy. Left ventricular diastolic parameters are consistent with Grade II diastolic dysfunction (pseudonormalization).  2. Right ventricular systolic function is normal. The right ventricular size is normal. There is normal pulmonary artery systolic pressure. The estimated right ventricular systolic pressure is 93.2 mmHg.  3. The mitral valve is normal in structure. Mild mitral valve regurgitation. FINDINGS  Left Ventricle: Left ventricular ejection fraction, by estimation, is 60 to 65%. The left ventricle has normal function. The left ventricle has no regional wall motion abnormalities. The left ventricular internal cavity size was normal in size. There is  mild left ventricular hypertrophy. Left ventricular diastolic parameters are consistent with Grade II diastolic dysfunction (pseudonormalization). Right Ventricle: The right ventricular size is normal. No increase in right ventricular wall thickness. Right ventricular systolic function is normal. There is normal pulmonary artery systolic pressure. The tricuspid regurgitant velocity is 2.02 m/s, and  with an assumed right atrial pressure of 5 mmHg, the estimated right ventricular systolic pressure is 67.1 mmHg. Left Atrium: Left atrial size was normal in size. Right Atrium: Right  atrial size was normal in size. Pericardium: There is no evidence of pericardial effusion. Mitral Valve: The mitral valve is normal in structure. Mild mitral valve regurgitation. No evidence of mitral valve stenosis. Tricuspid Valve: The tricuspid valve is normal in structure. Tricuspid valve regurgitation is mild . No evidence of tricuspid stenosis. Aortic Valve: The aortic valve was not well visualized. Aortic valve regurgitation is trivial. No aortic stenosis is present. Aortic valve mean gradient measures 3.5 mmHg. Aortic valve peak gradient measures 6.4 mmHg. Aortic valve area, by VTI measures 3.73 cm. Pulmonic Valve: The pulmonic valve was normal in structure. Pulmonic valve regurgitation is not visualized. No evidence of pulmonic stenosis. Aorta: The aortic root is normal in size and structure. Venous: The inferior vena cava is normal in size with greater than 50% respiratory variability, suggesting right atrial pressure of 3 mmHg. IAS/Shunts: No atrial level shunt detected by color flow Doppler.  LEFT VENTRICLE PLAX 2D LVIDd:         4.07 cm  Diastology LVIDs:         2.71 cm  LV e' medial:    5.77 cm/s LV PW:         1.29 cm  LV E/e' medial:  13.8 LV IVS:        0.88 cm  LV e' lateral:   9.14 cm/s LVOT diam:     2.10 cm  LV E/e' lateral: 8.7 LV SV:         86 LV SV Index:   51 LVOT Area:     3.46 cm  RIGHT  VENTRICLE RV Basal diam:  3.75 cm RV S prime:     12.00 cm/s TAPSE (M-mode): 3.4 cm LEFT ATRIUM           Index       RIGHT ATRIUM           Index LA diam:      3.20 cm 1.89 cm/m  RA Area:     15.30 cm LA Vol (A2C): 27.9 ml 16.49 ml/m RA Volume:   39.50 ml  23.34 ml/m LA Vol (A4C): 38.6 ml 22.81 ml/m  AORTIC VALVE                   PULMONIC VALVE AV Area (Vmax):    2.70 cm    PV Vmax:        0.85 m/s AV Area (Vmean):   3.01 cm    PV Peak grad:   2.9 mmHg AV Area (VTI):     3.73 cm    RVOT Peak grad: 3 mmHg AV Vmax:           126.50 cm/s AV Vmean:          87.700 cm/s AV VTI:            0.230 m  AV Peak Grad:      6.4 mmHg AV Mean Grad:      3.5 mmHg LVOT Vmax:         98.60 cm/s LVOT Vmean:        76.300 cm/s LVOT VTI:          0.248 m LVOT/AV VTI ratio: 1.08  AORTA Ao Root diam: 2.60 cm MITRAL VALVE               TRICUSPID VALVE MV Area (PHT): 3.63 cm    TR Peak grad:   16.3 mmHg MV Decel Time: 209 msec    TR Vmax:        202.00 cm/s MV E velocity: 79.70 cm/s MV A velocity: 71.60 cm/s  SHUNTS MV E/A ratio:  1.11        Systemic VTI:  0.25 m                            Systemic Diam: 2.10 cm Ida Rogue MD Electronically signed by Ida Rogue MD Signature Date/Time: 08/09/2020/2:59:39 PM    Final       ASSESSMENT & PLAN:  1. 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 Encounter for antineoplastic chemotherapy   3. Dehydration   4. Hyponatremia   5. 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  Labs were reviewed and discussed with patient. She has a borderline performance status I discussed with her about holding chemotherapy and proceed with supportive care.  Patient insists proceeding with chemotherapy as she feels that she well tolerated today. Proceed with cisplatin today. Continue mouthwash.  I added acyclovir 400 mg twice daily for HSV prophylaxis.  #Severe hypokalemia, potassium level 2.7. Patient will receive 40 mEq potassium chloride IV today. I recommend patient to start potassium chloride 20 mEq daily.  Prescription was sent to pharmacy  However repeat BMP in 2 days  #Poor oral intake, dysphagia, dehydration/hyponatremia. Patient will receive 1 L of IV fluid today. I discussed with her about option of having feeding tube placed and she declined. We will schedule patient on additional 2 hydration sessions this week.  #Hypotension, blood pressure is borderline.  Has been off hydralazine and Norvasc. Recommend patient to hold off lisinopril as well.  Just stay on metoprolol given the history of atrial fibrillation.    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  Continue current regimen. Continue percocet q4 hours as needed for pain and fentanyl patch 53mcg/h Q72 hours. .  Continue garbapentine 300mg  TID.  # constipation, continue colace 100mg  daily and use miralax daily PRN  # Weight loss  Continue nutrition supplementation. Follow up with nutritionist.  #Atrial fibrillation, currently in sinus rhythm.  Continue beta-blocker.   Continue Eliquis. All questions were answered. The patient knows to call the clinic with any problems questions or concerns.   Return of visit: 1 week  Earlie Server, MD, PhD Hematology Oncology [facility] Pager- [phone] 09/02/2020

## 2020-09-02 NOTE — Progress Notes (Signed)
Patient here for follow up. While walking to exam room, pt had an episode of whole body weakness. Pt reports feeling better, but this happens occasionally at home when she tries to move too much.

## 2020-09-03 ENCOUNTER — Inpatient Hospital Stay: Payer: Medicaid Other

## 2020-09-03 ENCOUNTER — Ambulatory Visit
Admission: RE | Admit: 2020-09-03 | Discharge: 2020-09-03 | Disposition: A | Discharge status: Home / Self Care | Payer: Medicaid Other | Source: Ambulatory Visit | Attending: Radiation Oncology | Admitting: Radiation Oncology

## 2020-09-03 VITALS — BP 134/75 | HR 66 | Temp 98.0°F | Resp 18

## 2020-09-03 DIAGNOSIS — Z510 Encounter for antineoplastic radiation therapy: Secondary | ICD-10-CM | POA: Diagnosis not present

## 2020-09-03 DIAGNOSIS — E871 Hypo-osmolality and hyponatremia: Secondary | ICD-10-CM

## 2020-09-03 MED ORDER — SODIUM CHLORIDE 0.9 % IV SOLN
Freq: Once | INTRAVENOUS | Status: AC
  Filled 2020-09-03: qty 250

## 2020-09-03 MED ORDER — HEPARIN SOD (PORK) LOCK FLUSH 100 UNIT/ML IV SOLN
500.0000 [IU] | Freq: Once | INTRAVENOUS | Status: AC | PRN
  Administered 2020-09-03: 500 [IU]
  Filled 2020-09-03: qty 5

## 2020-09-04 ENCOUNTER — Inpatient Hospital Stay: Payer: Medicaid Other | Attending: Oncology

## 2020-09-04 ENCOUNTER — Ambulatory Visit: Payer: Medicaid Other

## 2020-09-04 ENCOUNTER — Inpatient Hospital Stay: Payer: Medicaid Other

## 2020-09-04 ENCOUNTER — Other Ambulatory Visit: Payer: Self-pay | Admitting: Oncology

## 2020-09-04 ENCOUNTER — Other Ambulatory Visit: Payer: Self-pay

## 2020-09-04 ENCOUNTER — Ambulatory Visit
Admission: RE | Admit: 2020-09-04 | Discharge: 2020-09-04 | Disposition: A | Discharge status: Home / Self Care | Payer: Medicaid Other | Source: Ambulatory Visit | Attending: Radiation Oncology | Admitting: Radiation Oncology

## 2020-09-04 VITALS — BP 120/81 | HR 87 | Temp 98.0°F

## 2020-09-04 DIAGNOSIS — C310 Malignant neoplasm of maxillary sinus: Secondary | ICD-10-CM | POA: Diagnosis not present

## 2020-09-04 DIAGNOSIS — E871 Hypo-osmolality and hyponatremia: Secondary | ICD-10-CM

## 2020-09-04 DIAGNOSIS — E876 Hypokalemia: Secondary | ICD-10-CM

## 2020-09-04 DIAGNOSIS — Z510 Encounter for antineoplastic radiation therapy: Secondary | ICD-10-CM | POA: Diagnosis not present

## 2020-09-04 DIAGNOSIS — C760 Malignant neoplasm of head, face and neck: Secondary | ICD-10-CM

## 2020-09-04 LAB — COMPREHENSIVE METABOLIC PANEL
ALT: 17 U/L (ref 0–44)
AST: 28 U/L (ref 15–41)
Albumin: 2.7 g/dL — ABNORMAL LOW (ref 3.5–5.0)
Alkaline Phosphatase: 90 U/L (ref 38–126)
Anion gap: 10 (ref 5–15)
BUN: 18 mg/dL (ref 8–23)
CO2: 27 mmol/L (ref 22–32)
Calcium: 8.3 mg/dL — ABNORMAL LOW (ref 8.9–10.3)
Chloride: 99 mmol/L (ref 98–111)
Creatinine, Ser: 0.48 mg/dL (ref 0.44–1.00)
GFR, Estimated: >60 mL/min (ref >60)
Glucose, Bld: 163 mg/dL — ABNORMAL HIGH (ref 70–99)
Potassium: 3.3 mmol/L — ABNORMAL LOW (ref 3.5–5.1)
Sodium: 136 mmol/L (ref 135–145)
Total Bilirubin: 0.5 mg/dL (ref 0.3–1.2)
Total Protein: 5.8 g/dL — ABNORMAL LOW (ref 6.5–8.1)

## 2020-09-04 LAB — CBC WITH DIFFERENTIAL/PLATELET
Abs Immature Granulocytes: 0.03 10*3/uL (ref 0.00–0.07)
Basophils Absolute: 0.1 10*3/uL (ref 0.0–0.1)
Basophils Relative: 1 %
Eosinophils Absolute: 0.3 10*3/uL (ref 0.0–0.5)
Eosinophils Relative: 4 %
HCT: 29.5 % — ABNORMAL LOW (ref 36.0–46.0)
Hemoglobin: 9.8 g/dL — ABNORMAL LOW (ref 12.0–15.0)
Immature Granulocytes: 0 %
Lymphocytes Relative: 6 %
Lymphs Abs: 0.4 10*3/uL — ABNORMAL LOW (ref 0.7–4.0)
MCH: 33.2 pg (ref 26.0–34.0)
MCHC: 33.2 g/dL (ref 30.0–36.0)
MCV: 100 fL (ref 80.0–100.0)
Monocytes Absolute: 0.9 10*3/uL (ref 0.1–1.0)
Monocytes Relative: 11 %
Neutro Abs: 5.9 10*3/uL (ref 1.7–7.7)
Neutrophils Relative %: 78 %
Platelets: 367 10*3/uL (ref 150–400)
RBC: 2.95 MIL/uL — ABNORMAL LOW (ref 3.87–5.11)
RDW: 13.3 % (ref 11.5–15.5)
WBC: 7.5 10*3/uL (ref 4.0–10.5)
nRBC: 0 % (ref 0.0–0.2)

## 2020-09-04 LAB — MAGNESIUM: Magnesium: 1.7 mg/dL (ref 1.7–2.4)

## 2020-09-04 MED ORDER — POTASSIUM CHLORIDE IN NACL 20-0.9 MEQ/L-% IV SOLN
Freq: Once | INTRAVENOUS | Status: AC
  Filled 2020-09-04: qty 1000

## 2020-09-04 MED ORDER — SODIUM CHLORIDE 0.9 % IV SOLN
Freq: Once | INTRAVENOUS | Status: DC
  Filled 2020-09-04: qty 250

## 2020-09-04 MED ORDER — HEPARIN SOD (PORK) LOCK FLUSH 100 UNIT/ML IV SOLN
INTRAVENOUS | Status: AC
  Filled 2020-09-04: qty 5

## 2020-09-04 MED ORDER — SODIUM CHLORIDE 0.9 % IV SOLN
Freq: Once | INTRAVENOUS | Status: AC
  Filled 2020-09-04: qty 250

## 2020-09-04 NOTE — Telephone Encounter (Signed)
11/2: Receieved notice from Dawayne Patricia, RN that pathology contacted her to let her know that patient's slides were sent back to [place] because there was no slide consult order attached. [facility] has received the the slide consult order and they requested [facility] send slides back to cancer center.   11/3: Received 1 slide from [place] today and took it down to [facility] histology lab and handed to [place]. Slide consult order attached along with path report.

## 2020-09-04 NOTE — Progress Notes (Signed)
Per Dr. Tasia Catchings, "IV 1L NS + KCL 7meq today. repeat IV IL NS on Friday".

## 2020-09-05 ENCOUNTER — Other Ambulatory Visit: Payer: Self-pay | Admitting: Oncology

## 2020-09-05 ENCOUNTER — Inpatient Hospital Stay: Payer: Medicaid Other

## 2020-09-05 ENCOUNTER — Ambulatory Visit
Admission: RE | Admit: 2020-09-05 | Discharge: 2020-09-05 | Disposition: A | Discharge status: Home / Self Care | Payer: Medicaid Other | Source: Ambulatory Visit | Attending: Radiation Oncology | Admitting: Radiation Oncology

## 2020-09-05 DIAGNOSIS — Z510 Encounter for antineoplastic radiation therapy: Secondary | ICD-10-CM | POA: Diagnosis not present

## 2020-09-05 LAB — SLIDE CONSULT, PATHOLOGY ARMC

## 2020-09-05 NOTE — Progress Notes (Signed)
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  Patient receiving concurrent chemotherapy and radiation therapy.    Spoke with patient via phone.  Patient reports that she is eating oatmeal and creamy soups.  Reports ensure plus gave her diarrhea.  Has been drinking pedialyte.  Reports that boost max protein did not make her have diarrhea.   Noted patient has declined feeding tube placement per MD note.     Medications: reviewed  Labs: reviewed  Anthropometrics:   Weight 113 lb on 11/1 decreased from 120 lb on 10/25  124 lb 8 oz on 10/11 132 lb 3 oz on 9/23   NUTRITION DIAGNOSIS: Inadequate oral intake continues    INTERVENTION:  Patient has declined feeding tube placement. Encouraged high calorie, high protein soft moist foods.  Does not like smoothie type drinks.   Encouraged patient to go back to drinking shake that does not cause diarrhea     MONITORING, EVALUATION, GOAL: weight trends, intake   NEXT VISIT: Nov 11 phone f/u  Kelii Chittum B. Zenia Resides, [place], [place] Registered Dietitian [phone] (mobile)

## 2020-09-06 ENCOUNTER — Encounter: Payer: Self-pay | Admitting: Nurse Practitioner

## 2020-09-06 ENCOUNTER — Other Ambulatory Visit: Payer: Self-pay

## 2020-09-06 ENCOUNTER — Inpatient Hospital Stay: Payer: Medicaid Other

## 2020-09-06 ENCOUNTER — Emergency Department: Payer: Medicare Other

## 2020-09-06 ENCOUNTER — Ambulatory Visit: Payer: Medicaid Other

## 2020-09-06 ENCOUNTER — Inpatient Hospital Stay
Admission: EM | Admit: 2020-09-06 | Discharge: 2020-09-11 | DRG: 871 | Disposition: A | Discharge status: Home / Self Care | Payer: Medicare Other | Attending: Student | Admitting: Student

## 2020-09-06 ENCOUNTER — Ambulatory Visit: Admission: RE | Admit: 2020-09-06 | Payer: Medicaid Other | Source: Ambulatory Visit

## 2020-09-06 ENCOUNTER — Encounter: Payer: Self-pay | Admitting: Emergency Medicine

## 2020-09-06 VITALS — BP 83/68 | HR 96 | Temp 97.0°F | Resp 24

## 2020-09-06 DIAGNOSIS — Z7901 Long term (current) use of anticoagulants: Secondary | ICD-10-CM

## 2020-09-06 DIAGNOSIS — Z716 Tobacco abuse counseling: Secondary | ICD-10-CM

## 2020-09-06 DIAGNOSIS — Z720 Tobacco use: Secondary | ICD-10-CM | POA: Diagnosis present

## 2020-09-06 DIAGNOSIS — C140 Malignant neoplasm of pharynx, unspecified: Secondary | ICD-10-CM | POA: Diagnosis present

## 2020-09-06 DIAGNOSIS — F319 Bipolar disorder, unspecified: Secondary | ICD-10-CM | POA: Diagnosis present

## 2020-09-06 DIAGNOSIS — I119 Hypertensive heart disease without heart failure: Secondary | ICD-10-CM | POA: Diagnosis present

## 2020-09-06 DIAGNOSIS — I4819 Other persistent atrial fibrillation: Secondary | ICD-10-CM | POA: Diagnosis present

## 2020-09-06 DIAGNOSIS — Z8249 Family history of ischemic heart disease and other diseases of the circulatory system: Secondary | ICD-10-CM

## 2020-09-06 DIAGNOSIS — Z90710 Acquired absence of both cervix and uterus: Secondary | ICD-10-CM

## 2020-09-06 DIAGNOSIS — C760 Malignant neoplasm of head, face and neck: Secondary | ICD-10-CM

## 2020-09-06 DIAGNOSIS — Z9049 Acquired absence of other specified parts of digestive tract: Secondary | ICD-10-CM

## 2020-09-06 DIAGNOSIS — Z20822 Contact with and (suspected) exposure to covid-19: Secondary | ICD-10-CM | POA: Diagnosis present

## 2020-09-06 DIAGNOSIS — E871 Hypo-osmolality and hyponatremia: Secondary | ICD-10-CM

## 2020-09-06 DIAGNOSIS — I248 Other forms of acute ischemic heart disease: Secondary | ICD-10-CM | POA: Diagnosis present

## 2020-09-06 DIAGNOSIS — Z9221 Personal history of antineoplastic chemotherapy: Secondary | ICD-10-CM | POA: Diagnosis not present

## 2020-09-06 DIAGNOSIS — R6520 Severe sepsis without septic shock: Secondary | ICD-10-CM | POA: Diagnosis present

## 2020-09-06 DIAGNOSIS — J440 Chronic obstructive pulmonary disease with acute lower respiratory infection: Secondary | ICD-10-CM | POA: Diagnosis present

## 2020-09-06 DIAGNOSIS — Y95 Nosocomial condition: Secondary | ICD-10-CM | POA: Diagnosis present

## 2020-09-06 DIAGNOSIS — Z888 Allergy status to other drugs, medicaments and biological substances status: Secondary | ICD-10-CM

## 2020-09-06 DIAGNOSIS — J189 Pneumonia, unspecified organism: Secondary | ICD-10-CM | POA: Diagnosis present

## 2020-09-06 DIAGNOSIS — G459 Transient cerebral ischemic attack, unspecified: Secondary | ICD-10-CM | POA: Diagnosis present

## 2020-09-06 DIAGNOSIS — I959 Hypotension, unspecified: Secondary | ICD-10-CM | POA: Diagnosis present

## 2020-09-06 DIAGNOSIS — Z510 Encounter for antineoplastic radiation therapy: Secondary | ICD-10-CM | POA: Diagnosis not present

## 2020-09-06 DIAGNOSIS — C310 Malignant neoplasm of maxillary sinus: Secondary | ICD-10-CM | POA: Diagnosis present

## 2020-09-06 DIAGNOSIS — A419 Sepsis, unspecified organism: Principal | ICD-10-CM | POA: Diagnosis present

## 2020-09-06 DIAGNOSIS — Z886 Allergy status to analgesic agent status: Secondary | ICD-10-CM

## 2020-09-06 DIAGNOSIS — J188 Other pneumonia, unspecified organism: Secondary | ICD-10-CM | POA: Diagnosis present

## 2020-09-06 DIAGNOSIS — F419 Anxiety disorder, unspecified: Secondary | ICD-10-CM | POA: Diagnosis present

## 2020-09-06 DIAGNOSIS — R778 Other specified abnormalities of plasma proteins: Secondary | ICD-10-CM | POA: Diagnosis present

## 2020-09-06 DIAGNOSIS — I10 Essential (primary) hypertension: Secondary | ICD-10-CM | POA: Diagnosis present

## 2020-09-06 DIAGNOSIS — Z22322 Carrier or suspected carrier of Methicillin resistant Staphylococcus aureus: Secondary | ICD-10-CM

## 2020-09-06 DIAGNOSIS — Z923 Personal history of irradiation: Secondary | ICD-10-CM | POA: Diagnosis not present

## 2020-09-06 DIAGNOSIS — Z881 Allergy status to other antibiotic agents status: Secondary | ICD-10-CM

## 2020-09-06 DIAGNOSIS — I4891 Unspecified atrial fibrillation: Secondary | ICD-10-CM | POA: Diagnosis present

## 2020-09-06 DIAGNOSIS — J449 Chronic obstructive pulmonary disease, unspecified: Secondary | ICD-10-CM

## 2020-09-06 DIAGNOSIS — Z8673 Personal history of transient ischemic attack (TIA), and cerebral infarction without residual deficits: Secondary | ICD-10-CM

## 2020-09-06 DIAGNOSIS — F17210 Nicotine dependence, cigarettes, uncomplicated: Secondary | ICD-10-CM | POA: Diagnosis present

## 2020-09-06 DIAGNOSIS — E876 Hypokalemia: Secondary | ICD-10-CM | POA: Diagnosis present

## 2020-09-06 DIAGNOSIS — Z79899 Other long term (current) drug therapy: Secondary | ICD-10-CM | POA: Diagnosis not present

## 2020-09-06 DIAGNOSIS — I471 Supraventricular tachycardia: Secondary | ICD-10-CM

## 2020-09-06 DIAGNOSIS — R1310 Dysphagia, unspecified: Secondary | ICD-10-CM

## 2020-09-06 LAB — URINALYSIS, COMPLETE (UACMP) WITH MICROSCOPIC
Bacteria, UA: NONE SEEN
Bilirubin Urine: NEGATIVE
Glucose, UA: NEGATIVE mg/dL
Hgb urine dipstick: NEGATIVE
Ketones, ur: 5 mg/dL — AB
Leukocytes,Ua: NEGATIVE
Nitrite: NEGATIVE
Protein, ur: NEGATIVE mg/dL
Specific Gravity, Urine: 1.009 (ref 1.005–1.030)
pH: 7 (ref 5.0–8.0)

## 2020-09-06 LAB — CBC WITH DIFFERENTIAL/PLATELET
Abs Immature Granulocytes: 0.03 10*3/uL (ref 0.00–0.07)
Abs Immature Granulocytes: 0.03 10*3/uL (ref 0.00–0.07)
Basophils Absolute: 0.1 10*3/uL (ref 0.0–0.1)
Basophils Absolute: 0.1 10*3/uL (ref 0.0–0.1)
Basophils Relative: 1 %
Basophils Relative: 1 %
Eosinophils Absolute: 0.2 10*3/uL (ref 0.0–0.5)
Eosinophils Absolute: 0.1 10*3/uL (ref 0.0–0.5)
Eosinophils Relative: 2 %
Eosinophils Relative: 2 %
HCT: 29.7 % — ABNORMAL LOW (ref 36.0–46.0)
HCT: 30.7 % — ABNORMAL LOW (ref 36.0–46.0)
Hemoglobin: 10 g/dL — ABNORMAL LOW (ref 12.0–15.0)
Hemoglobin: 10 g/dL — ABNORMAL LOW (ref 12.0–15.0)
Immature Granulocytes: 0 %
Immature Granulocytes: 0 %
Lymphocytes Relative: 8 %
Lymphocytes Relative: 9 %
Lymphs Abs: 0.5 10*3/uL — ABNORMAL LOW (ref 0.7–4.0)
Lymphs Abs: 0.7 10*3/uL (ref 0.7–4.0)
MCH: 33.2 pg (ref 26.0–34.0)
MCH: 32.6 pg (ref 26.0–34.0)
MCHC: 33.7 g/dL (ref 30.0–36.0)
MCHC: 32.6 g/dL (ref 30.0–36.0)
MCV: 98.7 fL (ref 80.0–100.0)
MCV: 100 fL (ref 80.0–100.0)
Monocytes Absolute: 0.7 10*3/uL (ref 0.1–1.0)
Monocytes Absolute: 0.9 10*3/uL (ref 0.1–1.0)
Monocytes Relative: 10 %
Monocytes Relative: 11 %
Neutro Abs: 5.3 10*3/uL (ref 1.7–7.7)
Neutro Abs: 6.4 10*3/uL (ref 1.7–7.7)
Neutrophils Relative %: 79 %
Neutrophils Relative %: 77 %
Platelets: 332 10*3/uL (ref 150–400)
Platelets: 338 10*3/uL (ref 150–400)
RBC: 3.01 MIL/uL — ABNORMAL LOW (ref 3.87–5.11)
RBC: 3.07 MIL/uL — ABNORMAL LOW (ref 3.87–5.11)
RDW: 13.4 % (ref 11.5–15.5)
RDW: 13.1 % (ref 11.5–15.5)
WBC: 6.7 10*3/uL (ref 4.0–10.5)
WBC: 8.2 10*3/uL (ref 4.0–10.5)
nRBC: 0 % (ref 0.0–0.2)
nRBC: 0 % (ref 0.0–0.2)

## 2020-09-06 LAB — EXPECTORATED SPUTUM ASSESSMENT W GRAM STAIN, RFLX TO RESP C

## 2020-09-06 LAB — COMPREHENSIVE METABOLIC PANEL
ALT: 17 U/L (ref 0–44)
ALT: 16 U/L (ref 0–44)
AST: 20 U/L (ref 15–41)
AST: 19 U/L (ref 15–41)
Albumin: 2.6 g/dL — ABNORMAL LOW (ref 3.5–5.0)
Albumin: 2.6 g/dL — ABNORMAL LOW (ref 3.5–5.0)
Alkaline Phosphatase: 86 U/L (ref 38–126)
Alkaline Phosphatase: 85 U/L (ref 38–126)
Anion gap: 12 (ref 5–15)
Anion gap: 15 (ref 5–15)
BUN: 13 mg/dL (ref 8–23)
BUN: 12 mg/dL (ref 8–23)
CO2: 27 mmol/L (ref 22–32)
CO2: 26 mmol/L (ref 22–32)
Calcium: 7.9 mg/dL — ABNORMAL LOW (ref 8.9–10.3)
Calcium: 7.6 mg/dL — ABNORMAL LOW (ref 8.9–10.3)
Chloride: 97 mmol/L — ABNORMAL LOW (ref 98–111)
Chloride: 99 mmol/L (ref 98–111)
Creatinine, Ser: 0.49 mg/dL (ref 0.44–1.00)
Creatinine, Ser: 0.39 mg/dL — ABNORMAL LOW (ref 0.44–1.00)
GFR, Estimated: >60 mL/min (ref >60)
GFR, Estimated: >60 mL/min (ref >60)
Glucose, Bld: 133 mg/dL — ABNORMAL HIGH (ref 70–99)
Glucose, Bld: 107 mg/dL — ABNORMAL HIGH (ref 70–99)
Potassium: 2.7 mmol/L — CL (ref 3.5–5.1)
Potassium: 2.8 mmol/L — ABNORMAL LOW (ref 3.5–5.1)
Sodium: 136 mmol/L (ref 135–145)
Sodium: 140 mmol/L (ref 135–145)
Total Bilirubin: 0.5 mg/dL (ref 0.3–1.2)
Total Bilirubin: 0.7 mg/dL (ref 0.3–1.2)
Total Protein: 5.7 g/dL — ABNORMAL LOW (ref 6.5–8.1)
Total Protein: 5.6 g/dL — ABNORMAL LOW (ref 6.5–8.1)

## 2020-09-06 LAB — MRSA PCR SCREENING: MRSA by PCR: POSITIVE — AB

## 2020-09-06 LAB — LACTIC ACID, PLASMA: Lactic Acid, Venous: 1.9 mmol/L (ref 0.5–1.9)

## 2020-09-06 LAB — RESPIRATORY PANEL BY RT PCR (FLU A&B, COVID)
Influenza A by PCR: NEGATIVE
Influenza B by PCR: NEGATIVE
SARS Coronavirus 2 by RT PCR: NEGATIVE

## 2020-09-06 LAB — MAGNESIUM
Magnesium: 1.4 mg/dL — ABNORMAL LOW (ref 1.7–2.4)
Magnesium: 1.4 mg/dL — ABNORMAL LOW (ref 1.7–2.4)

## 2020-09-06 LAB — TROPONIN I (HIGH SENSITIVITY)
Troponin I (High Sensitivity): 27 ng/L — ABNORMAL HIGH (ref <18)
Troponin I (High Sensitivity): 25 ng/L — ABNORMAL HIGH (ref <18)

## 2020-09-06 LAB — PROCALCITONIN: Procalcitonin: <0.1 ng/mL

## 2020-09-06 IMAGING — DX DG CHEST 1V PORT
1 series · 1 of 1 positions shown · non-contrast
Comparison: CT [DATE].  Chest x-ray [DATE].

CLINICAL DATA: Weakness.  Hypotension.

EXAM:
PORTABLE CHEST 1 VIEW

[chest ap]
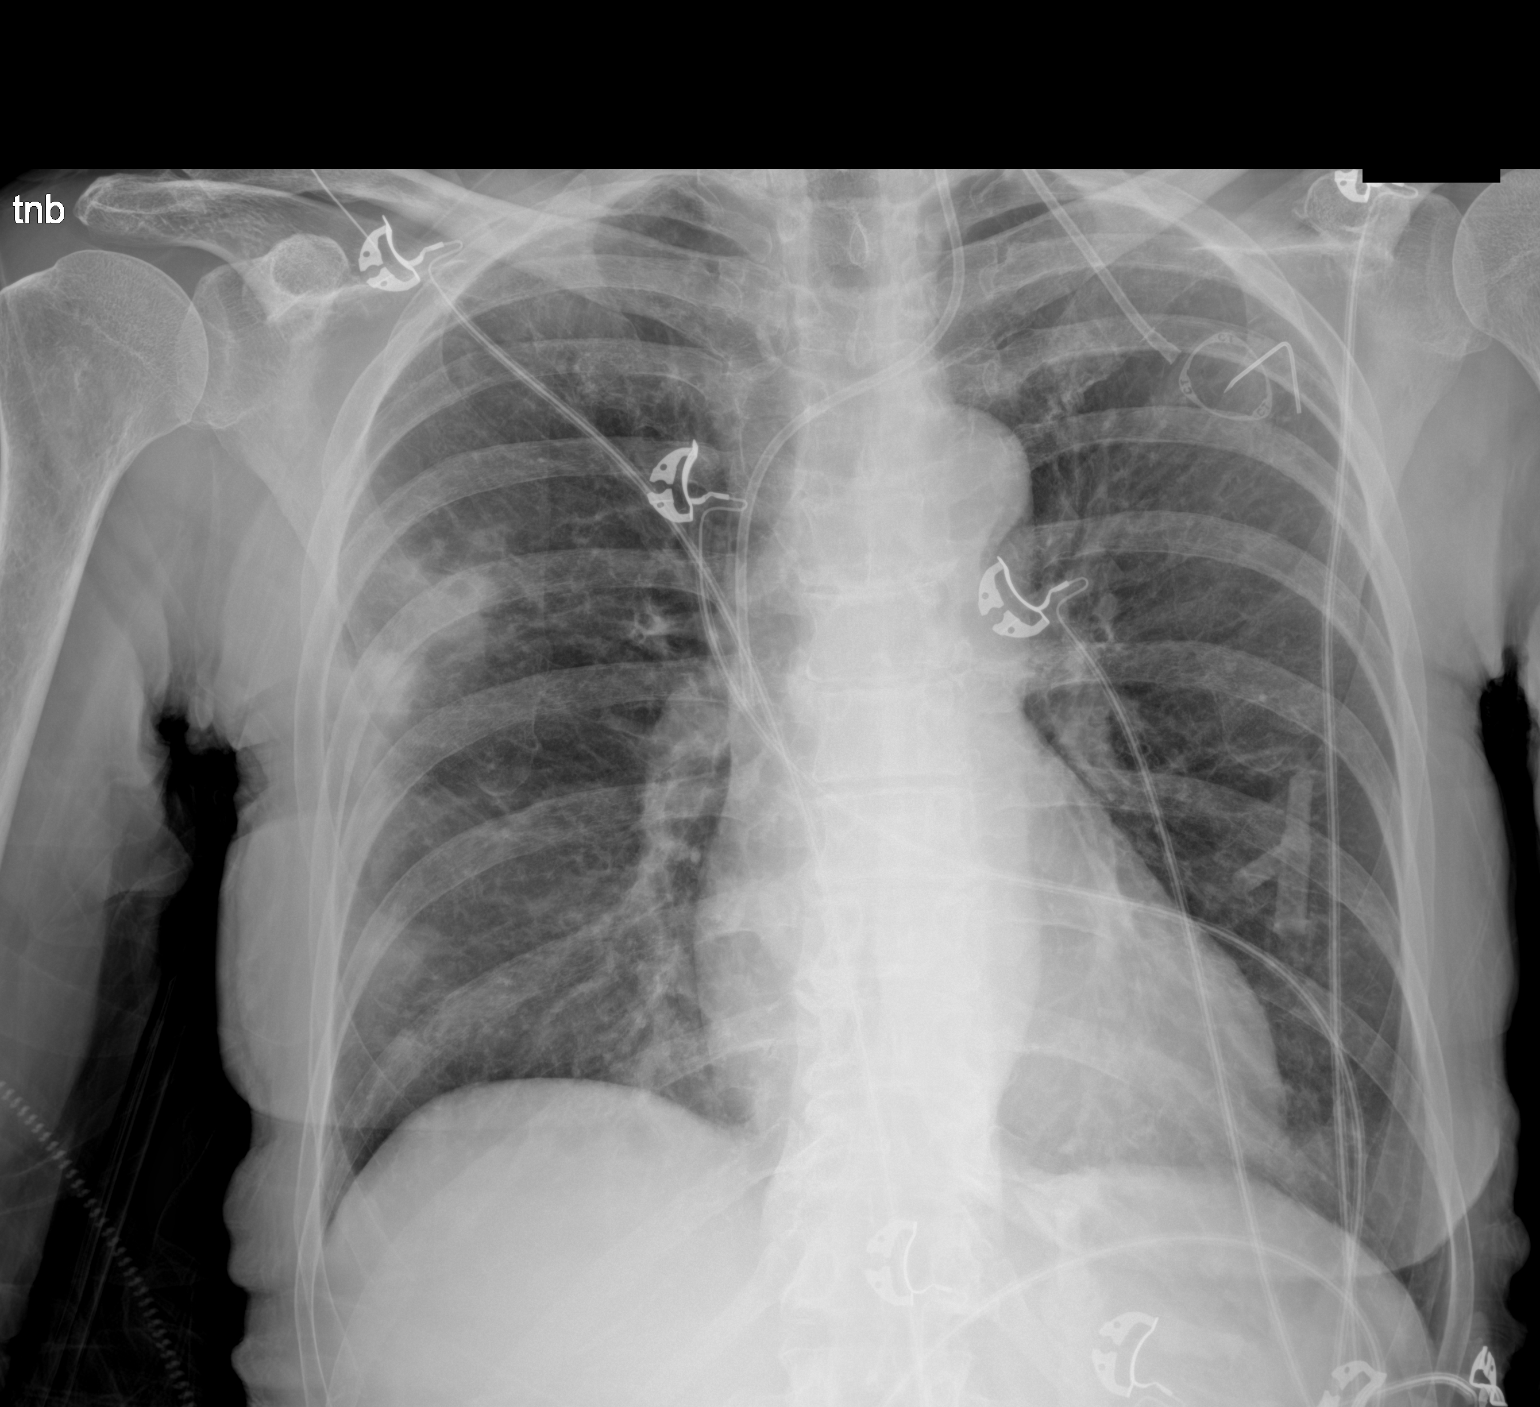

[1 of 1 positions shown; findings below may reference images not displayed]

FINDINGS: PowerPort catheter noted with tip over superior vena cava.
Cardiomegaly. No pulmonary venous congestion. Multifocal right lung
infiltrates noted. Tiny cavitary infiltrate right upper lung cannot
be excluded. Mild left base atelectasis scratched it mild bibasilar
atelectasis. No pleural effusion or pneumothorax.
IMPRESSION: 1. PowerPort catheter with tip over superior vena cava.
2. Multifocal right lung infiltrates noted. Tiny cavitary infiltrate
right upper lung cannot be excluded.

## 2020-09-06 IMAGING — CT CT CHEST W/ CM
2 of 3 series · 15 of 36 positions shown, 18 images · IV contrast (omnipaque)
Comparison: Chest x-ray from earlier in the same day and CT from
[DATE].

CLINICAL DATA: Follow-up abnormal chest x-ray

EXAM:
CT CHEST WITH CONTRAST
TECHNIQUE: Multidetector CT imaging of the chest was performed during
intravenous contrast administration.
CONTRAST:  75mL OMNIPAQUE IOHEXOL 300 MG/ML  SOLN

[Series 2: axial st · axial · 0.65mm/px · z∈[-547,-289]mm · 12 of 153 slices shown, 15 images]
[im 12/153  mediastinal]
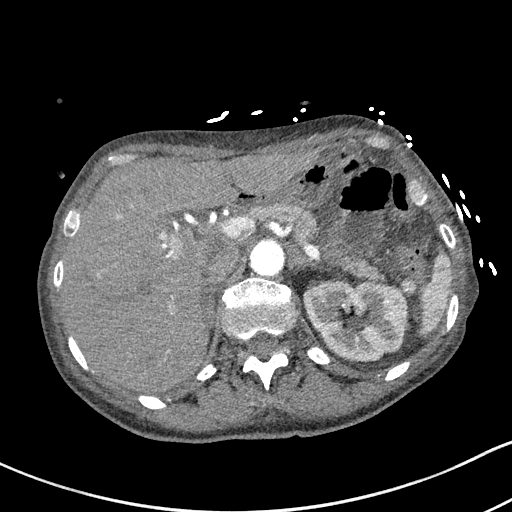
[im 12/153  lung]
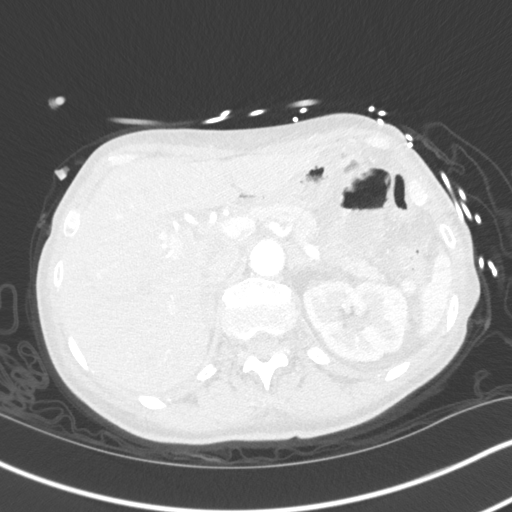
[im 23/153  lung]
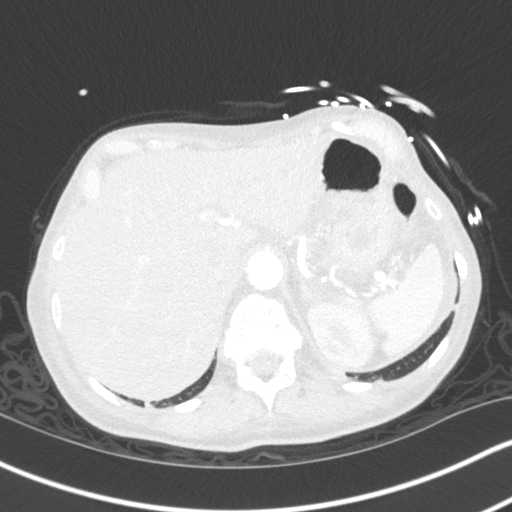
[im 34/153  lung]
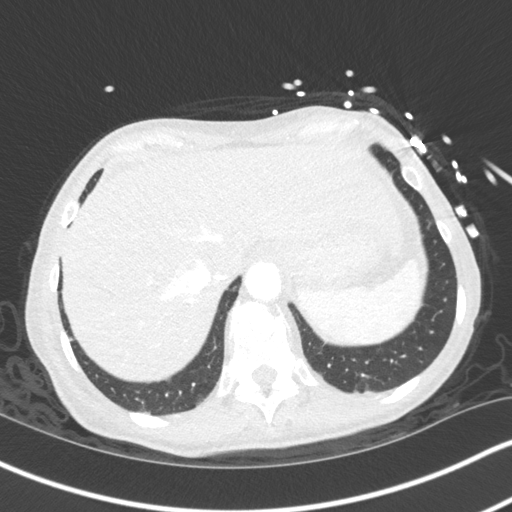
[im 46/153  lung]
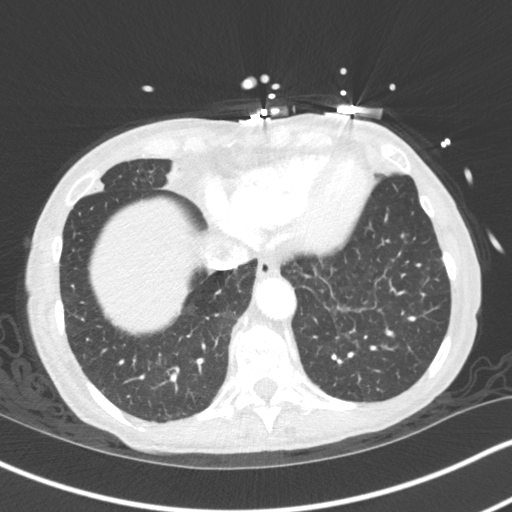
[im 57/153  mediastinal]
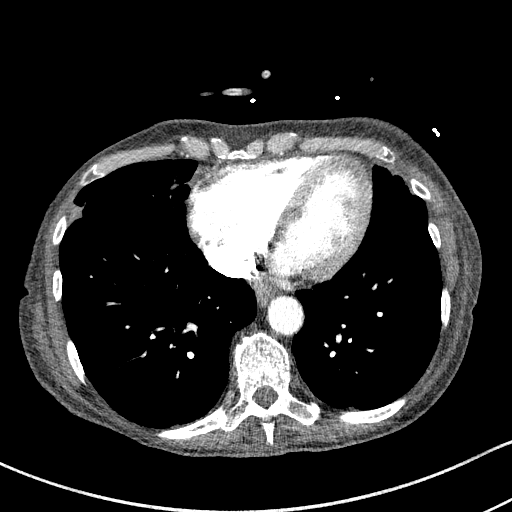
[im 57/153  lung]
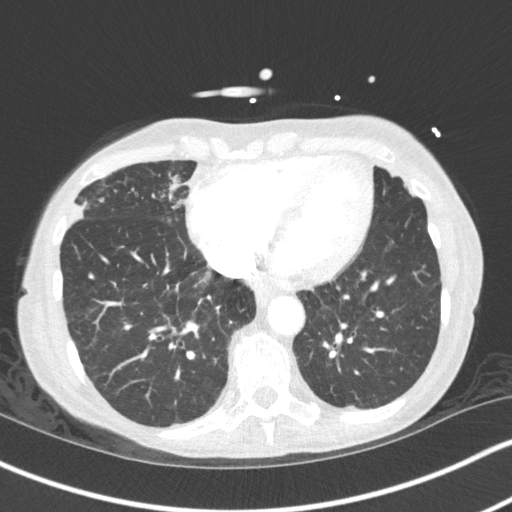
[im 68/153  lung]
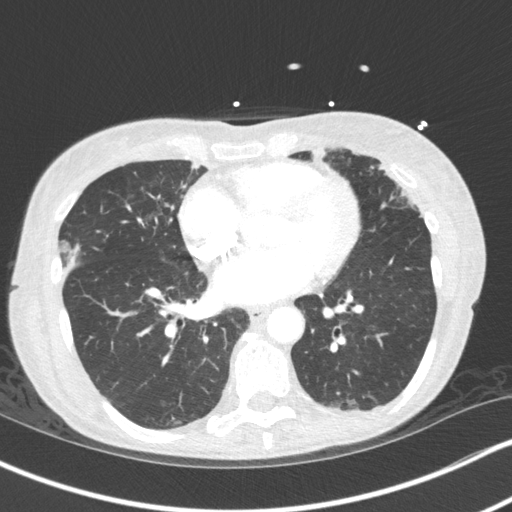
[im 85/153  lung]
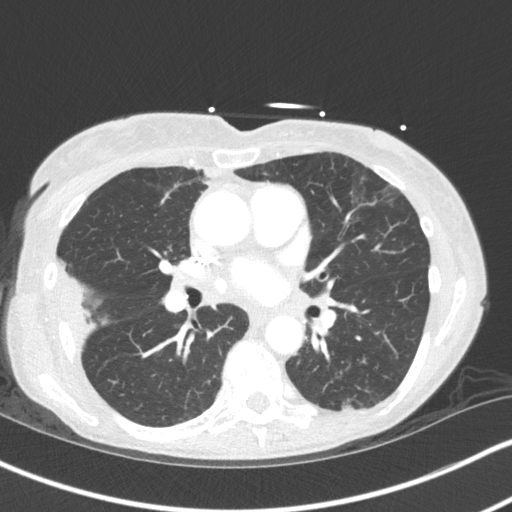
[im 96/153  lung]
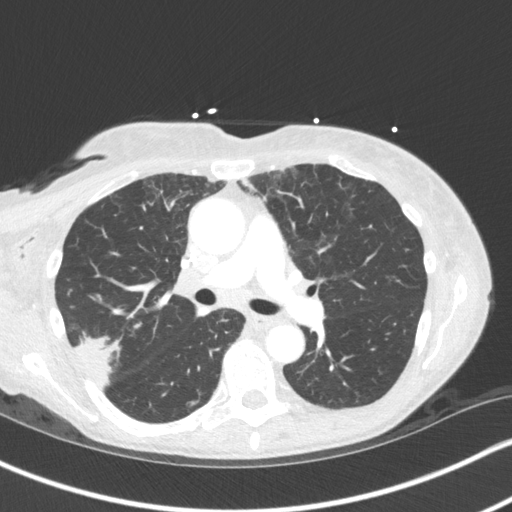
[im 107/153  mediastinal]
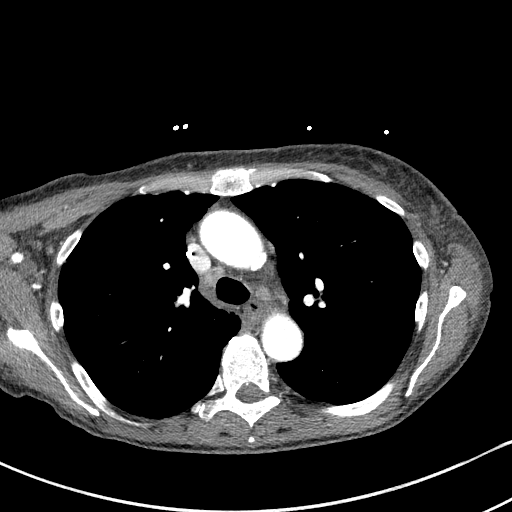
[im 107/153  lung]
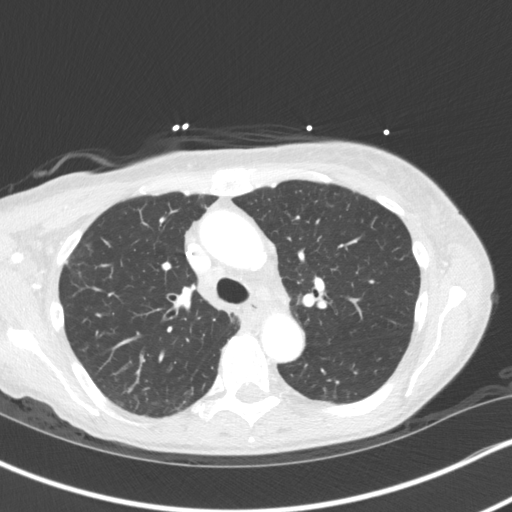
[im 119/153  lung]
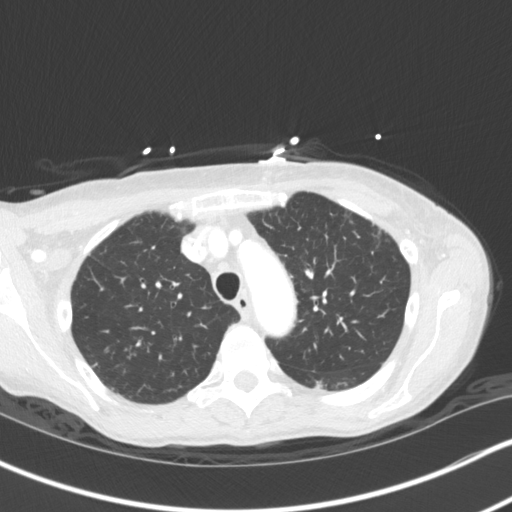
[im 130/153  lung]
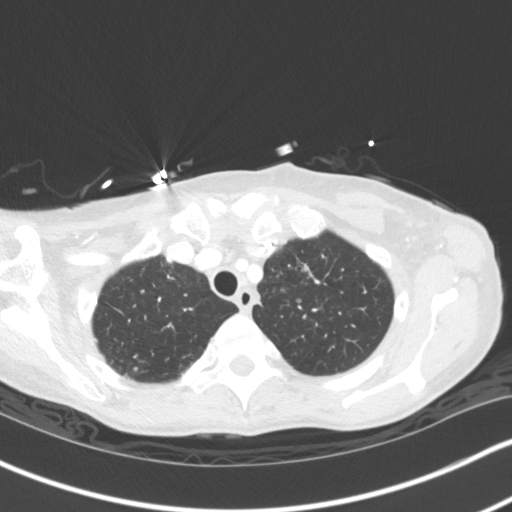
[im 141/153  lung]
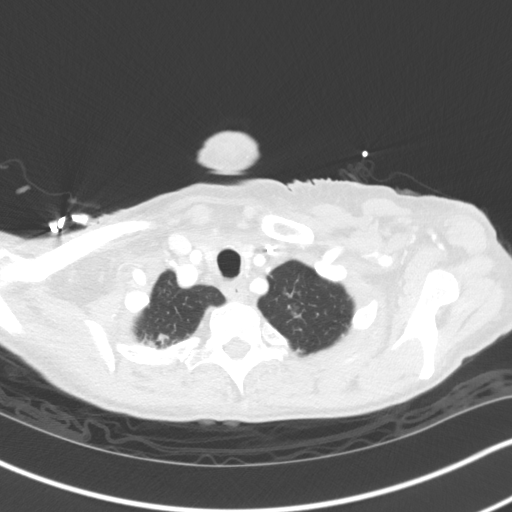

[Series 5: coronal · coronal · 0.63mm/px · 3 of 111 slices shown]
[im 23/111  lung]
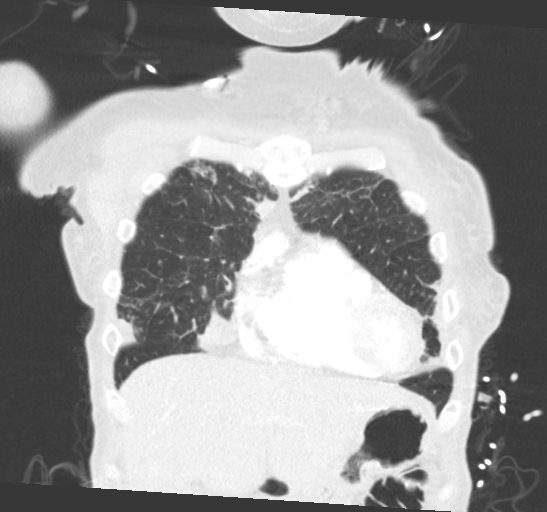
[im 45/111  lung]
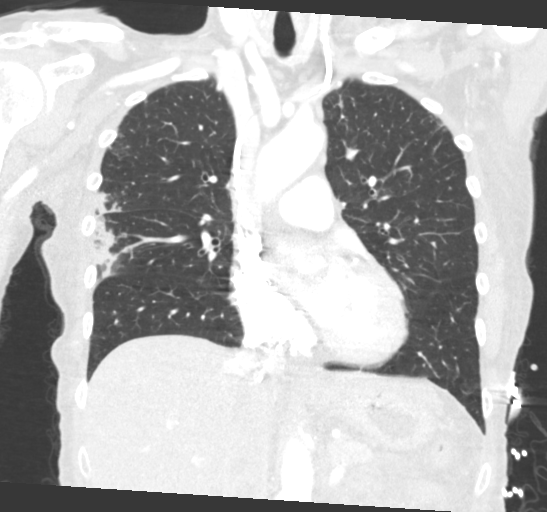
[im 67/111  lung]
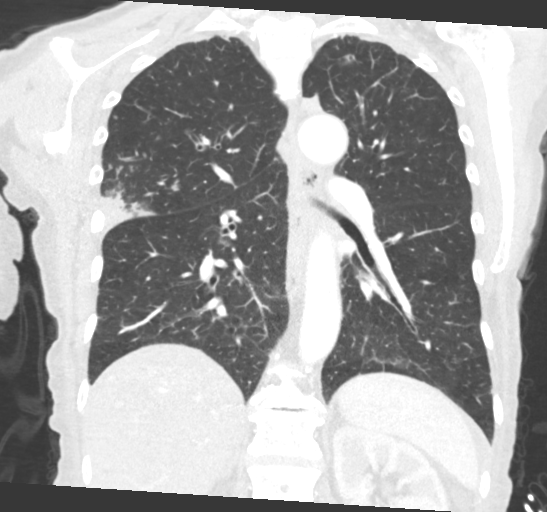

[15 of 36 positions shown; findings below may reference images not displayed]

FINDINGS: Cardiovascular: Thoracic aorta demonstrates atherosclerotic
calcifications. No dissection is noted. No aneurysmal dilatation is
seen. The pulmonary artery as visualized shows no pulmonary embolism
although not timed for true embolus evaluation. No cardiac
enlargement is seen. No coronary calcifications are noted.
Left-sided chest wall port is noted in satisfactory position.

Mediastinum/Nodes: Thoracic inlet is within normal limits. Scattered
right hilar and subcarinal lymph nodes are seen. The largest of the
hilar lymph nodes measures approximately 13 mm in short axis.
Subcarinal node measures 10 mm in short axis. The esophagus as
visualized is within normal limits.

Lungs/Pleura: Lungs are well aerated bilaterally. The previously
seen chest x-ray abnormality lies in the posterior aspect of the
right upper lobe and is pleural base consistent with acute
pneumonia. Patchy similar infiltrates are noted in the right middle
lobe as well as the lingula on the left. The appearance of cavitary
component seen on prior plain film is not XIONG out on the CT and
likely projectional in nature. No sizable parenchymal nodule is
seen. No effusion or pneumothorax is noted.

Upper Abdomen: Visualized upper abdomen shows mild fatty
infiltration of the liver.

Musculoskeletal: Degenerative changes of the thoracic spine are
noted. No acute rib abnormality is noted. No compression deformity
is seen.
IMPRESSION: Patchy multifocal infiltrate worst in the right upper lobe
posteriorly and laterally consistent with that seen on recent plain
film examination. This is new from the prior CT consistent with
multifocal pneumonia. Followup PA and lateral chest X-ray is
recommended in 3-4 weeks following trial of antibiotic therapy to
ensure resolution.

Fatty liver.

No other focal abnormality is noted.

Aortic Atherosclerosis ([DT]-[DT]).

## 2020-09-06 MED ORDER — IOHEXOL 300 MG/ML  SOLN
75.0000 mL | Freq: Once | INTRAMUSCULAR | Status: AC | PRN
  Administered 2020-09-06: 75 mL via INTRAVENOUS

## 2020-09-06 MED ORDER — POTASSIUM CHLORIDE 10 MEQ/100ML IV SOLN
10.0000 meq | Freq: Once | INTRAVENOUS | Status: AC
  Administered 2020-09-06: 10 meq via INTRAVENOUS
  Filled 2020-09-06: qty 100

## 2020-09-06 MED ORDER — VANCOMYCIN HCL IN DEXTROSE 1-5 GM/200ML-% IV SOLN
1000.0000 mg | Freq: Once | INTRAVENOUS | Status: AC
  Administered 2020-09-06: 1000 mg via INTRAVENOUS
  Filled 2020-09-06: qty 200

## 2020-09-06 MED ORDER — METOPROLOL TARTRATE 50 MG PO TABS
50.0000 mg | ORAL_TABLET | Freq: Two times a day (BID) | ORAL | Status: DC
  Administered 2020-09-06 – 2020-09-10 (×7): 50 mg via ORAL
  Filled 2020-09-06 (×8): qty 1

## 2020-09-06 MED ORDER — GABAPENTIN 300 MG PO CAPS
300.0000 mg | ORAL_CAPSULE | Freq: Three times a day (TID) | ORAL | Status: DC
  Administered 2020-09-06 – 2020-09-11 (×14): 300 mg via ORAL
  Filled 2020-09-06 (×14): qty 1

## 2020-09-06 MED ORDER — VANCOMYCIN HCL 500 MG/100ML IV SOLN
500.0000 mg | Freq: Two times a day (BID) | INTRAVENOUS | Status: DC
  Administered 2020-09-07 – 2020-09-08 (×3): 500 mg via INTRAVENOUS
  Filled 2020-09-06 (×5): qty 100

## 2020-09-06 MED ORDER — OXYCODONE-ACETAMINOPHEN 5-325 MG PO TABS: 1.0000 | ORAL_TABLET | ORAL | Status: DC | PRN

## 2020-09-06 MED ORDER — NICOTINE 21 MG/24HR TD PT24
21.0000 mg | MEDICATED_PATCH | Freq: Every day | TRANSDERMAL | Status: DC
  Administered 2020-09-06 – 2020-09-10 (×5): 21 mg via TRANSDERMAL
  Filled 2020-09-06 (×5): qty 1

## 2020-09-06 MED ORDER — POTASSIUM CHLORIDE 20 MEQ/15ML (10%) PO SOLN
40.0000 meq | Freq: Once | ORAL | Status: AC
  Administered 2020-09-06: 40 meq via ORAL
  Filled 2020-09-06: qty 30

## 2020-09-06 MED ORDER — HYDROCORTISONE NA SUCCINATE PF 100 MG IJ SOLR
100.0000 mg | Freq: Once | INTRAMUSCULAR | Status: AC
  Administered 2020-09-06: 100 mg via INTRAVENOUS
  Filled 2020-09-06: qty 2

## 2020-09-06 MED ORDER — LIDOCAINE-PRILOCAINE 2.5-2.5 % EX CREA
1.0000 "application " | TOPICAL_CREAM | CUTANEOUS | Status: DC | PRN
  Filled 2020-09-06: qty 5

## 2020-09-06 MED ORDER — SODIUM CHLORIDE 0.9 % IV BOLUS
1000.0000 mL | Freq: Once | INTRAVENOUS | Status: AC
  Administered 2020-09-06: 1000 mL via INTRAVENOUS

## 2020-09-06 MED ORDER — POTASSIUM CHLORIDE CRYS ER 20 MEQ PO TBCR: 40.0000 meq | EXTENDED_RELEASE_TABLET | ORAL | Status: DC

## 2020-09-06 MED ORDER — ONDANSETRON HCL 4 MG/2ML IJ SOLN: 4.0000 mg | Freq: Three times a day (TID) | INTRAMUSCULAR | Status: DC | PRN

## 2020-09-06 MED ORDER — DILTIAZEM LOAD VIA INFUSION
10.0000 mg | Freq: Once | INTRAVENOUS | Status: AC
  Administered 2020-09-06: 10 mg via INTRAVENOUS
  Filled 2020-09-06: qty 10

## 2020-09-06 MED ORDER — DOCUSATE SODIUM 100 MG PO CAPS
100.0000 mg | ORAL_CAPSULE | Freq: Every day | ORAL | Status: DC
  Administered 2020-09-06 – 2020-09-11 (×5): 100 mg via ORAL
  Filled 2020-09-06 (×5): qty 1

## 2020-09-06 MED ORDER — HEPARIN SOD (PORK) LOCK FLUSH 100 UNIT/ML IV SOLN
500.0000 [IU] | Freq: Once | INTRAVENOUS | Status: DC | PRN
  Filled 2020-09-06: qty 5

## 2020-09-06 MED ORDER — POTASSIUM CHLORIDE 10 MEQ/100ML IV SOLN
10.0000 meq | INTRAVENOUS | Status: AC
  Administered 2020-09-06 – 2020-09-07 (×6): 10 meq via INTRAVENOUS
  Filled 2020-09-06 (×6): qty 100

## 2020-09-06 MED ORDER — DEXAMETHASONE 4 MG PO TABS
4.0000 mg | ORAL_TABLET | Freq: Every day | ORAL | Status: DC
  Administered 2020-09-06 – 2020-09-11 (×6): 4 mg via ORAL
  Filled 2020-09-06 (×9): qty 1

## 2020-09-06 MED ORDER — POTASSIUM CHLORIDE CRYS ER 20 MEQ PO TBCR
40.0000 meq | EXTENDED_RELEASE_TABLET | Freq: Once | ORAL | Status: DC
  Filled 2020-09-06: qty 2

## 2020-09-06 MED ORDER — ACETAMINOPHEN 325 MG PO TABS: 650.0000 mg | ORAL_TABLET | Freq: Four times a day (QID) | ORAL | Status: DC | PRN

## 2020-09-06 MED ORDER — DILTIAZEM HCL-DEXTROSE 125-5 MG/125ML-% IV SOLN (PREMIX)
5.0000 mg/h | INTRAVENOUS | Status: DC
  Administered 2020-09-06: 5 mg/h via INTRAVENOUS
  Administered 2020-09-07: 10 mg/h via INTRAVENOUS
  Filled 2020-09-06 (×3): qty 125

## 2020-09-06 MED ORDER — ENSURE ENLIVE PO LIQD
237.0000 mL | Freq: Three times a day (TID) | ORAL | Status: DC
  Administered 2020-09-07: 237 mL via ORAL

## 2020-09-06 MED ORDER — SODIUM CHLORIDE 0.9 % IV SOLN
2.0000 g | Freq: Once | INTRAVENOUS | Status: AC
  Administered 2020-09-06: 2 g via INTRAVENOUS
  Filled 2020-09-06: qty 2

## 2020-09-06 MED ORDER — SODIUM CHLORIDE 0.9 % IV SOLN: Freq: Once | INTRAVENOUS | Status: AC

## 2020-09-06 MED ORDER — SODIUM CHLORIDE 0.9 % IV SOLN
500.0000 mg | Freq: Once | INTRAVENOUS | Status: AC
  Administered 2020-09-06: 500 mg via INTRAVENOUS
  Filled 2020-09-06: qty 500

## 2020-09-06 MED ORDER — SODIUM CHLORIDE 0.9 % IV SOLN
2.0000 g | Freq: Two times a day (BID) | INTRAVENOUS | Status: DC
  Administered 2020-09-06 – 2020-09-08 (×4): 2 g via INTRAVENOUS
  Filled 2020-09-06 (×5): qty 2

## 2020-09-06 MED ORDER — POLYETHYLENE GLYCOL 3350 17 GM/SCOOP PO POWD
1.0000 | Freq: Every day | ORAL | Status: DC | PRN
  Filled 2020-09-06: qty 255

## 2020-09-06 MED ORDER — MELATONIN 5 MG PO TABS: 10.0000 mg | ORAL_TABLET | Freq: Every evening | ORAL | Status: DC | PRN

## 2020-09-06 MED ORDER — SODIUM CHLORIDE 0.9 % IV SOLN
500.0000 mg | INTRAVENOUS | Status: DC
  Administered 2020-09-07: 500 mg via INTRAVENOUS
  Filled 2020-09-06 (×2): qty 500

## 2020-09-06 MED ORDER — APIXABAN 5 MG PO TABS
5.0000 mg | ORAL_TABLET | Freq: Two times a day (BID) | ORAL | Status: DC
  Administered 2020-09-06 – 2020-09-11 (×10): 5 mg via ORAL
  Filled 2020-09-06 (×10): qty 1

## 2020-09-06 MED ORDER — ALBUTEROL SULFATE HFA 108 (90 BASE) MCG/ACT IN AERS
2.0000 | INHALATION_SPRAY | RESPIRATORY_TRACT | Status: DC | PRN
  Filled 2020-09-06: qty 6.7

## 2020-09-06 MED ORDER — DM-GUAIFENESIN ER 30-600 MG PO TB12: 1.0000 | ORAL_TABLET | Freq: Two times a day (BID) | ORAL | Status: DC | PRN

## 2020-09-06 MED ORDER — ADULT MULTIVITAMIN W/MINERALS CH
1.0000 | ORAL_TABLET | Freq: Every day | ORAL | Status: DC
  Filled 2020-09-06 (×5): qty 1

## 2020-09-06 MED ORDER — CHLORHEXIDINE GLUCONATE 0.12 % MT SOLN
10.0000 mL | Freq: Two times a day (BID) | OROMUCOSAL | Status: DC
  Administered 2020-09-07 – 2020-09-11 (×7): 10 mL via OROMUCOSAL
  Filled 2020-09-06 (×9): qty 15

## 2020-09-06 MED ORDER — FENTANYL 25 MCG/HR TD PT72
1.0000 | MEDICATED_PATCH | TRANSDERMAL | Status: DC
  Administered 2020-09-06 – 2020-09-09 (×2): 1 via TRANSDERMAL
  Filled 2020-09-06 (×2): qty 1

## 2020-09-06 MED ORDER — MAGNESIUM SULFATE 2 GM/50ML IV SOLN
2.0000 g | Freq: Once | INTRAVENOUS | Status: AC
  Administered 2020-09-06: 2 g via INTRAVENOUS
  Filled 2020-09-06: qty 50

## 2020-09-06 MED ORDER — SODIUM CHLORIDE 0.9 % IV SOLN
Freq: Once | INTRAVENOUS | Status: AC
  Filled 2020-09-06: qty 250

## 2020-09-06 NOTE — ED Notes (Signed)
Patient unable to finish drinking potassium solution due to burning in mouth. Quentin Cornwall, MD aware.

## 2020-09-06 NOTE — Consult Note (Signed)
CODE SEPSIS - PHARMACY COMMUNICATION  **Broad Spectrum Antibiotics should be administered within 1 hour of Sepsis diagnosis**  Time Code Sepsis Called/Page Received: 1707  Antibiotics Ordered: Cefepime/Vancomycin  Time of 1st antibiotic administration: 5056  Additional action taken by pharmacy: none  If necessary, Name of Provider/Nurse Contacted: [place] ,PharmD Clinical Pharmacist  09/06/2020  5:18 PM

## 2020-09-06 NOTE — ED Notes (Signed)
Unsuccessful IV attempt x2.  

## 2020-09-06 NOTE — ED Notes (Signed)
Date and time results received: 09/06/20 9:31 PM (use smartphrase ".now" to insert current time)  Test: MRSA Critical Value: Positive  Name of Provider Notified: Blaine Hamper, MD  Orders Received? Or Actions Taken?: Orders Received - See Orders for details

## 2020-09-06 NOTE — ED Notes (Signed)
Spoke with lab, they state they can add on the pocalcitonin to existing blood in the lab.

## 2020-09-06 NOTE — Consult Note (Signed)
Pharmacy Antibiotic Note  Shannon Schroeder is a 64 y.o. female admitted on 09/06/2020 with multifocal pneumonia (immunosuppressed).    Pharmacy has been consulted for Vancomycin/Cefepime dosing.  Plan: Patient received 1000mg  loading dose Vancomycin in the ED, will follow with  Vancomycin 500mg  IV every 12 hours.  Goal trough 15-20 mcg/mL.  Will start Cefepime 2g q12h  Height: 5\' 6"  (167.6 cm) Weight: 51.4 kg (113 lb 6.4 oz) IBW/kg (Calculated) : 59.3  Temp (24hrs), Avg:97.8 F (36.6 C), Min:97 F (36.1 C), Max:98.6 F (37 C)  Recent Labs  Lab 09/02/20 0815 09/04/20 1249 09/06/20 1141 09/06/20 1243  WBC 10.8* 7.5 6.7 8.2  CREATININE 0.47 0.48 0.49 0.39*  LATICACIDVEN  --   --   --  1.9    Estimated Creatinine Clearance: 56.9 mL/min (A) (by C-G formula based on SCr of 0.39 mg/dL (L)).    Allergies  Allergen Reactions  . Aspirin Swelling and Other (See Comments)  . Belladonna Alkaloids Rash  . Fluoxetine Hcl Other (See Comments) and Rash  . Naproxen Swelling, Rash and Other (See Comments)  . Phenobarbital Rash and Other (See Comments)  . Prozac [Fluoxetine Hcl] Rash    Antimicrobials this admission: Vancomycin 11/5 >> Cefepime 11/5 >>  Dose adjustments this admission: N/A  Microbiology results: 11/5 BCx: pending  11/5 MRSA PCR: pending  COVID/FLU NEG  Thank you for allowing pharmacy to be a part of this patient's care.  Lu Duffel, PharmD, BCPS Clinical Pharmacist 09/06/2020 5:15 PM

## 2020-09-06 NOTE — Progress Notes (Signed)
Patient presented to chemo infusion suite today for scheduled IVF.  Pt reports feeling extremely weak.  Noted to have pale color and sores on her lips.  Her HR was irregular and her BP was low at 83/68.  Labs drawn and IVF started.  NP to chairside for assessment.  Pt sent to emergency department for further intervention.  Pt left in a wheelchair escorted by two RNs to the ED.

## 2020-09-06 NOTE — ED Notes (Signed)
Patient provided with a sprite.

## 2020-09-06 NOTE — Progress Notes (Signed)
Evaluated patient at request of nursing. Patient had reported weakness overnight. Radiation held today. No BP medication today. Hypotensive. Irregular rate and rhythm. Significantly weaker than baseline requiring assistance to change positions. Recent admission for a fib w/ rvr. Discussed w/ Dr. Tasia Catchings. We both recommend transfer to ER for evaluation and evaluation from cardiology. Patient agreeable.

## 2020-09-06 NOTE — ED Triage Notes (Signed)
Pt presents to ED via wheelchair from cancer center with c/o weakness and hypotension, per [place] pt currently in A-fib, noted to be hypotensive at cancer center. Upon arrival patient noted to be in A-fib RVR with HR 150-160's. Pt A&O x4 upon arrival to room. Per [facility] RN pt currently being treated for back/neck/mandibular cancer, last Chemo on Monday, last radiation yesterday, pt reports feeling sudden onset weakness yesterday.

## 2020-09-06 NOTE — ED Provider Notes (Signed)
[facility] Emergency Department Provider Note    First MD Initiated Contact with Patient 09/06/20 1234     (approximate)  I have reviewed the triage vital signs and the nursing notes.   HISTORY  Chief Complaint Weakness, Hypotension, and Tachycardia    HPI Shannon Schroeder is a 64 y.o. female extensive past medical history as listed below currently undergoing chemotherapy as well as radiation therapy for throat cancer presents from cancer center due to significant weakness with evidence of A. fib with RVR.  States she been feeling weak since yesterday feeling palpitations.  Did not take her Eliquis this morning did not take her metoprolol.  Has had decreased p.o. intake.  No measured fevers.  No cough or congestion.  Denies any history of congestive heart failure.    Past Medical History:  Diagnosis Date  . 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   Marland Kitchen Anxiety   . Arthritis   . Bipolar disorder ([place])   . COPD (chronic obstructive pulmonary disease) ([place])    NO INHALERS  . Depression   . Dysrhythmia    H/O V TACH  . Head and neck cancer ([place]) 07/29/2020  . Headache    H/O MIGRAINES  . Hypertension   . Seizures ([place])    X1 AFTER FALL   Family History  Problem Relation Age of Onset  . Hypertension Mother   . Heart failure Mother   . Hypertension Brother   . Stroke Brother   . Hypertension Son   . Emphysema Maternal Aunt   . Hypertension Paternal Aunt   . Hypertension Paternal Uncle   . Hypertension Maternal Grandmother   . Hypertension Paternal Grandmother    Past Surgical History:  Procedure Laterality Date  . ABDOMINAL HYSTERECTOMY     partial  . BREAST SURGERY     biopsy  . CARDIAC CATHETERIZATION     X 2  . CARPAL TUNNEL RELEASE Right   . CARPECTOMY HAND Right   . CATARACT EXTRACTION W/PHACO Left 03/08/2018   Procedure: CATARACT EXTRACTION PHACO AND INTRAOCULAR LENS PLACEMENT (IOC);   Surgeon: Birder Robson, MD;  Location: [facility] ORS;  Service: Ophthalmology;  Laterality: Left;  [place] 00:55 AP% 13.5 CDE 7.52 Fluid pack lot # [id number]  . CATARACT EXTRACTION W/PHACO Right 04/05/2018   Procedure: CATARACT EXTRACTION PHACO AND INTRAOCULAR LENS PLACEMENT (IOC);  Surgeon: Birder Robson, MD;  Location: [facility] ORS;  Service: Ophthalmology;  Laterality: Right;  [place] 00:36 AP% 15.9 CDE 5.72 Fluid pack lot # [id number]  . CEREBRAL ANEURYSM REPAIR    . CHOLECYSTECTOMY    . COLONOSCOPY WITH PROPOFOL N/A 04/26/2019   Procedure: COLONOSCOPY WITH PROPOFOL;  Surgeon: Jonathon Bellows, MD;  Location: [facility] ENDOSCOPY;  Service: Gastroenterology;  Laterality: N/A;  . PORTACATH PLACEMENT Left 08/05/2020   Procedure: INSERTION PORT-A-CATH;  Surgeon: Herbert Pun, MD;  Location: [facility] ORS;  Service: General;  Laterality: Left;  . TUBAL LIGATION     Patient Active Problem List   Diagnosis Date Noted  . Severe sepsis ([place]) 09/06/2020  . Multifocal pneumonia 09/06/2020  . Drug-induced constipation 08/26/2020  . Hypotension 08/12/2020  . Encounter for antineoplastic chemotherapy 08/08/2020  . Atrial fibrillation with RVR ([place]) 08/08/2020  . Elevated troponin 08/08/2020  . Leukocytosis 08/08/2020  . Hypokalemia 08/08/2020  . SIRS (systemic inflammatory response syndrome) ([place]) 08/08/2020  . Goals of care, counseling/discussion 08/06/2020  . Head and neck cancer ([place]) 07/29/2020  . 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 Weight loss 07/29/2020  . Neoplasm related pain 07/29/2020  . Abscess of upper gum 09/12/2019  . History of atrial fibrillation 03/22/2019  . Bipolar affective disorder, current episode mixed ([place]) 06/29/2018  . TIA (transient ischemic attack) 06/15/2018  . Tobacco abuse 06/02/2018  . COPD (chronic obstructive pulmonary disease) ([place]) 04/21/2018  . URI (upper respiratory infection) 03/31/2018  . Hypertension 03/31/2018  . Tachycardia 02/02/2018  . Rash of body  02/02/2018  . Hyponatremia 02/02/2018      Prior to Admission medications   Medication Sig Start Date End Date Taking? Authorizing Provider  acetaminophen (TYLENOL 8 HOUR ARTHRITIS PAIN) 650 MG CR tablet Take 1,300 mg by mouth daily as needed (Pain).  Patient not taking: Reported on 09/02/2020    [provider]  acyclovir (ZOVIRAX) 400 MG tablet Take 1 tablet (400 mg total) by mouth 2 (two) times daily. 09/02/20   Earlie Server, MD  apixaban (ELIQUIS) 5 MG TABS tablet Take 1 tablet (5 mg total) by mouth 2 (two) times daily. 08/10/20   Minna Merritts, MD  b complex vitamins capsule Take 1 capsule by mouth daily. Gummie    [provider]  chlorhexidine (PERIDEX) 0.12 % solution Use as directed 10 mLs in the mouth or throat 2 (two) times daily. Swish and spit 08/13/20   Earlie Server, MD  dexamethasone (DECADRON) 4 MG tablet Take 1 tablet (4 mg total) by mouth daily. 08/19/20   Earlie Server, MD  docusate sodium (COLACE) 100 MG capsule Take 1 capsule (100 mg total) by mouth daily. 08/26/20   Earlie Server, MD  feeding supplement, ENSURE ENLIVE, (ENSURE ENLIVE) LIQD Take 237 mLs by mouth 3 (three) times daily between meals. 08/10/20   Fritzi Mandes, MD  fentaNYL (DURAGESIC) 25 MCG/HR Place 1 patch onto the skin every 3 (three) days. 08/19/20   Earlie Server, MD  gabapentin (NEURONTIN) 300 MG capsule Take 1 capsule (300 mg total) by mouth 3 (three) times daily. 07/19/20   Borders, Kirt Boys, NP  Ginkgo Biloba (GINKOBA PO) Take 1 tablet by mouth daily.  Patient not taking: Reported on 09/02/2020    [provider]  hydrALAZINE (APRESOLINE) 25 MG tablet Take 1 tablet (25 mg total) by mouth 2 (two) times daily. Patient not taking: Reported on 09/02/2020 08/10/20   Fritzi Mandes, MD  ibuprofen (ADVIL) 400 MG tablet Take 400 mg by mouth every 6 (six) hours as needed for mild pain or moderate pain. Liquid Gel    [provider]  lidocaine-prilocaine (EMLA) cream Apply to affected area once Patient  taking differently: Apply 1 application topically as needed [facility]). Apply to affected area once 07/29/20   Earlie Server, MD  lisinopril (ZESTRIL) 40 MG tablet Take 1 tablet (40 mg total) by mouth daily. 08/11/20   Fritzi Mandes, MD  Melatonin 10 MG TABS Take 10 mg by mouth at bedtime as needed (sleep).     [provider]  metoprolol tartrate (LOPRESSOR) 50 MG tablet Take 1 tablet (50 mg total) by mouth 2 (two) times daily. 08/10/20   Fritzi Mandes, MD  Multiple Vitamins-Minerals (CENTRUM SILVER PO) Take 1 tablet by mouth daily. Gummie    [provider]  nicotine (NICODERM CQ - DOSED IN MG/24 HOURS) 21 mg/24hr patch Place 1 patch (21 mg total) onto the skin daily. Patient not taking: Reported on 08/19/2020 08/11/20   Fritzi Mandes, MD  oxyCODONE-acetaminophen (PERCOCET) 5-325 MG tablet Take 1 tablet by mouth every 4 (four)  hours as needed for severe pain. 08/12/20 08/12/21  Earlie Server, MD  polyethylene glycol powder (GLYCOLAX/MIRALAX) 17 GM/SCOOP powder Take 1 Container by mouth daily as needed.    [provider]  potassium chloride SA (KLOR-CON) 20 MEQ tablet Take 1 tablet (20 mEq total) by mouth daily. 09/02/20   Earlie Server, MD  prochlorperazine (COMPAZINE) 10 MG tablet Take 1 tablet (10 mg total) by mouth every 6 (six) hours as needed (Nausea or vomiting). 07/29/20   Earlie Server, MD    Allergies Aspirin, Belladonna alkaloids, Fluoxetine hcl, Naproxen, Phenobarbital, and Prozac [fluoxetine hcl]    Social History Social History   Tobacco Use  . Smoking status: Current Every Day Smoker    Packs/day: 1.00    Years: 50.00    Pack years: 50.00    Types: Cigarettes  . Smokeless tobacco: Never Used  Vaping Use  . Vaping Use: Never used  Substance Use Topics  . Alcohol use: No  . Drug use: No    Review of Systems Patient denies headaches, rhinorrhea, blurry vision, numbness, shortness of breath, chest pain, edema, cough, abdominal pain, nausea, vomiting, diarrhea, dysuria,  fevers, rashes or hallucinations unless otherwise stated above in HPI. ____________________________________________   PHYSICAL EXAM:  VITAL SIGNS: Vitals:   09/06/20 1630 09/06/20 1645  BP: 123/81 125/78  Pulse: 91 92  Resp: 15 15  Temp:    SpO2: 97% 97%    Constitutional: Alert and oriented.  Eyes: Conjunctivae are normal.  Head: Atraumatic. Nose: No congestion/rhinnorhea. Mouth/Throat: Mucous membranes are dry.   Neck: No stridor. Painless ROM.  Cardiovascular: tachycardic irregularly irregular rhythm. Grossly normal heart sounds.  Good peripheral circulation. Respiratory: Normal respiratory effort.  No retractions. Lungs CTAB. Gastrointestinal: Soft and nontender. No distention. No abdominal bruits. No CVA tenderness. Genitourinary:  Musculoskeletal: No lower extremity tenderness nor edema.  No joint effusions. Neurologic:  Normal speech and language. No gross focal neurologic deficits are appreciated. No facial droop Skin:  Skin is warm, dry and intact. No rash noted. Psychiatric: Mood and affect are normal. Speech and behavior are normal.  ____________________________________________   LABS (all labs ordered are listed, but only abnormal results are displayed)  Results for orders placed or performed during the hospital encounter of 09/06/20 (from the past 24 hour(s))  CBC with Differential     Status: Abnormal   Collection Time: 09/06/20 12:43 PM  Result Value Ref Range   WBC 8.2 4.0 - 10.5 K/uL   RBC 3.07 (L) 3.87 - 5.11 MIL/uL   Hemoglobin 10.0 (L) 12.0 - 15.0 g/dL   HCT 30.7 (L) 36 - 46 %   MCV 100.0 80.0 - 100.0 fL   MCH 32.6 26.0 - 34.0 pg   MCHC 32.6 30.0 - 36.0 g/dL   RDW 13.1 11.5 - 15.5 %   Platelets 338 150 - 400 K/uL   nRBC 0.0 0.0 - 0.2 %   Neutrophils Relative % 77 %   Neutro Abs 6.4 1.7 - 7.7 K/uL   Lymphocytes Relative 9 %   Lymphs Abs 0.7 0.7 - 4.0 K/uL   Monocytes Relative 11 %   Monocytes Absolute 0.9 0.1 - 1.0 K/uL   Eosinophils Relative  2 %   Eosinophils Absolute 0.1 0.0 - 0.5 K/uL   Basophils Relative 1 %   Basophils Absolute 0.1 0.0 - 0.1 K/uL   Immature Granulocytes 0 %   Abs Immature Granulocytes 0.03 0.00 - 0.07 K/uL  Comprehensive metabolic panel     Status: Abnormal  Collection Time: 09/06/20 12:43 PM  Result Value Ref Range   Sodium 140 135 - 145 mmol/L   Potassium 2.8 (L) 3.5 - 5.1 mmol/L   Chloride 99 98 - 111 mmol/L   CO2 26 22 - 32 mmol/L   Glucose, Bld 107 (H) 70 - 99 mg/dL   BUN 12 8 - 23 mg/dL   Creatinine, Ser 0.39 (L) 0.44 - 1.00 mg/dL   Calcium 7.6 (L) 8.9 - 10.3 mg/dL   Total Protein 5.6 (L) 6.5 - 8.1 g/dL   Albumin 2.6 (L) 3.5 - 5.0 g/dL   AST 19 15 - 41 U/L   ALT 16 0 - 44 U/L   Alkaline Phosphatase 85 38 - 126 U/L   Total Bilirubin 0.7 0.3 - 1.2 mg/dL   GFR, Estimated >60 >60 mL/min   Anion gap 15 5 - 15  Lactic acid, plasma     Status: None   Collection Time: 09/06/20 12:43 PM  Result Value Ref Range   Lactic Acid, Venous 1.9 0.5 - 1.9 mmol/L  Troponin I (High Sensitivity)     Status: Abnormal   Collection Time: 09/06/20 12:43 PM  Result Value Ref Range   Troponin I (High Sensitivity) 27 (H) <18 ng/L  Magnesium     Status: Abnormal   Collection Time: 09/06/20 12:43 PM  Result Value Ref Range   Magnesium 1.4 (L) 1.7 - 2.4 mg/dL  Respiratory Panel by RT PCR (Flu A&B, Covid) - Nasopharyngeal Swab     Status: None   Collection Time: 09/06/20  1:01 PM   Specimen: Nasopharyngeal Swab  Result Value Ref Range   SARS Coronavirus 2 by RT PCR NEGATIVE NEGATIVE   Influenza A by PCR NEGATIVE NEGATIVE   Influenza B by PCR NEGATIVE NEGATIVE  Urinalysis, Complete w Microscopic     Status: Abnormal   Collection Time: 09/06/20  1:15 PM  Result Value Ref Range   Color, Urine YELLOW (A) YELLOW   APPearance CLEAR (A) CLEAR   Specific Gravity, Urine 1.009 1.005 - 1.030   pH 7.0 5.0 - 8.0   Glucose, UA NEGATIVE NEGATIVE mg/dL   Hgb urine dipstick NEGATIVE NEGATIVE   Bilirubin Urine NEGATIVE  NEGATIVE   Ketones, ur 5 (A) NEGATIVE mg/dL   Protein, ur NEGATIVE NEGATIVE mg/dL   Nitrite NEGATIVE NEGATIVE   Leukocytes,Ua NEGATIVE NEGATIVE   RBC / HPF 0-5 0 - 5 RBC/hpf   WBC, UA 0-5 0 - 5 WBC/hpf   Bacteria, UA NONE SEEN NONE SEEN   Squamous Epithelial / LPF 0-5 0 - 5   Mucus PRESENT   Troponin I (High Sensitivity)     Status: Abnormal   Collection Time: 09/06/20  3:16 PM  Result Value Ref Range   Troponin I (High Sensitivity) 25 (H) <18 ng/L   ____________________________________________  EKG My review and personal interpretation at Time: 12:41   Indication: weakness  Rate: 165  Rhythm: afib with rvr Axis: normal Other: normal intervals, nonspecific st and t wave abn likely rate dependenct ____________________________________________  RADIOLOGY  I personally reviewed all radiographic images ordered to evaluate for the above acute complaints and reviewed radiology reports and findings.  These findings were personally discussed with the patient.  Please see medical record for radiology report.  ____________________________________________   PROCEDURES  Procedure(s) performed:  .Critical Care Performed by: Merlyn Lot, MD Authorized by: Merlyn Lot, MD   Critical care provider statement:    Critical care time (minutes):  40   Critical care time  was exclusive of:  Separately billable procedures and treating other patients   Critical care was necessary to treat or prevent imminent or life-threatening deterioration of the following conditions:  Circulatory failure   Critical care was time spent personally by me on the following activities:  Development of treatment plan with patient or surrogate, discussions with consultants, evaluation of patient's response to treatment, examination of patient, obtaining history from patient or surrogate, ordering and performing treatments and interventions, ordering and review of laboratory studies, ordering and review of  radiographic studies, pulse oximetry, re-evaluation of patient's condition and review of old charts      Critical Care performed: yes ____________________________________________   INITIAL IMPRESSION / ASSESSMENT AND PLAN / ED COURSE  Pertinent labs & imaging results that were available during my care of the patient were reviewed by me and considered in my medical decision making (see chart for details).   DDX: Electrolyte abnormality, dehydration, sepsis, A. fib with RVR, pneumonia, CHF, ACS, PE  ADLYN FIFE is a 64 y.o. who presents to the ED with symptoms as described above with evidence of A. fib with RVR.  Will give IV fluids.  Placed on cardiac monitor showing heart rates in the 160s and 170s in A. fib.  Will check blood work will give IV fluids.  Will place on Cardizem bolus and infusion IV.  The patient will be placed on continuous pulse oximetry and telemetry for monitoring.  Laboratory evaluation will be sent to evaluate for the above complaints.     Clinical Course as of Sep 06 1700  Fri Sep 06, 2020  1350 The no lactic acidosis no white count.  Does not seem consistent with sepsis.  Will order IV magnesium as well as potassium.  Based on chest x-ray imaging will order CT scan of chest with contrast.   [PR]  6314 X-ray does show evidence of interval development of multifocal pneumonia.  Will order antibiotics.  Will admit to hospitalist for further medical management.   [PR]    Clinical Course User Index [PR] Merlyn Lot, MD    The patient was evaluated in Emergency Department today for the symptoms described in the history of present illness. He/she was evaluated in the context of the global COVID-19 pandemic, which necessitated consideration that the patient might be at risk for infection with the SARS-CoV-2 virus that causes COVID-19. Institutional protocols and algorithms that pertain to the evaluation of patients at risk for COVID-19 are in a state of rapid  change based on information released by regulatory bodies including the CDC and federal and state organizations. These policies and algorithms were followed during the patient's care in the ED.  As part of my medical decision making, I reviewed the following data within the [facility] notes reviewed and incorporated, Labs reviewed, notes from prior ED visits and [place] Controlled Substance Database   ____________________________________________   FINAL CLINICAL IMPRESSION(S) / ED DIAGNOSES  Final diagnoses:  Atrial fibrillation with RVR ([place])  Pneumonia of right lung due to infectious organism, unspecified part of lung      NEW MEDICATIONS STARTED DURING THIS VISIT:  New Prescriptions   No medications on file     Note:  This document was prepared using Dragon voice recognition software and may include unintentional dictation errors.    Merlyn Lot, MD 09/06/20 1701

## 2020-09-06 NOTE — Consult Note (Signed)
PHARMACY -  BRIEF ANTIBIOTIC NOTE   Pharmacy has received consult(s) for vancomycin/cefepime from an ED provider.  The patient's profile has been reviewed for ht/wt/allergies/indication/available labs.    One time order(s) placed for Cefepime 2g IV x 1, Vancomycin 1000mg  IV x 1  Further antibiotics/pharmacy consults should be ordered by admitting physician if indicated.                       Thank you,  Lu Duffel, PharmD, BCPS Clinical Pharmacist 09/06/2020 4:42 PM

## 2020-09-06 NOTE — H&P (Addendum)
History and Physical    Shannon Schroeder RRN:[id number] DOB: 02-20-1956 DOA: 09/06/2020  Referring MD/NP/PA:   PCP: Pcp, No   Patient coming from:  The patient is coming from home.  At baseline, pt is independent for most of ADL.        Chief Complaint: Palpitation, weakness, cough  HPI: Shannon Schroeder is a 64 y.o. 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  Patient states that she was in [place] for follow-up with her oncologist, and developed generalized weakness and palpitation.  Per report, patient was found to have atrial fibrillation with RVR, with heart rate 150-160s.  Patient denies chest pain.  She has dry cough, no shortness of breath, fever or chills.  Denies nausea, vomiting, diarrhea or abdominal pain.  Patient has mouth pain, poor appetite and decreased oral intake recently.  No symptoms of UTI or unilateral weakness.  Initially patient had hypotension with blood pressure 83/68, which improved to 120/94 after giving 1 L normal saline bolus in ED.  ED Course: pt was found to have WBC 8.2, lactic acid 1.9, troponin level 27, 25, negative Covid PCR, potassium 2.8, Mg 1.4, renal function okay, temperature normal, RR 24, oxygen saturation 95% on room air.  Chest x-ray with multifocal infiltration.  CT of the chest showed multifocal infiltration.  Patient is admitted to progressive bed as inpatient.  Review of Systems:   General: no fevers, chills, no body weight gain, has poor appetite, has fatigue and weaknes HEENT: no blurry vision, hearing changes or sore throat. Has mouth pain. Respiratory: no dyspnea, has coughing, no wheezing CV: no chest pain, has palpitations GI: no nausea, vomiting, abdominal pain, diarrhea,  constipation GU: no dysuria, burning on urination, increased urinary frequency, hematuria  Ext: no leg edema Neuro: no unilateral weakness, numbness, or tingling, no vision change or hearing loss Skin: no rash, no skin tear. MSK: No muscle spasm, no deformity, no limitation of range of movement in spin Heme: No easy bruising.  Travel history: No recent long distant travel.  Allergy:  Allergies  Allergen Reactions  . Aspirin Swelling and Other (See Comments)  . Belladonna Alkaloids Rash  . Fluoxetine Hcl Other (See Comments) and Rash  . Naproxen Swelling, Rash and Other (See Comments)  . Phenobarbital Rash and Other (See Comments)  . Prozac [Fluoxetine Hcl] Rash    Past Medical History:  Diagnosis Date  . Aneurysm of anterior cerebral artery 06/29/2018   Receiving care and treatment at [facility].   Marland Kitchen Anxiety   . Arthritis   . Bipolar disorder ([place])   . COPD (chronic obstructive pulmonary disease) ([place])    NO INHALERS  . Depression   . Dysrhythmia    H/O V TACH  . Head and neck cancer ([place]) 07/29/2020  . Headache    H/O MIGRAINES  . Hypertension   . Seizures ([place])    X1 AFTER FALL    Past Surgical History:  Procedure Laterality Date  . ABDOMINAL HYSTERECTOMY     partial  . BREAST SURGERY     biopsy  . CARDIAC CATHETERIZATION     X 2  . CARPAL TUNNEL RELEASE Right   . CARPECTOMY HAND Right   . CATARACT EXTRACTION W/PHACO Left 03/08/2018   Procedure: CATARACT EXTRACTION PHACO AND INTRAOCULAR LENS PLACEMENT (IOC);  Surgeon: Birder Robson,  MD;  Location: [facility] ORS;  Service: Ophthalmology;  Laterality: Left;  [place] 00:55 AP% 13.5 CDE 7.52 Fluid pack lot # [id number]  . CATARACT EXTRACTION W/PHACO Right 04/05/2018   Procedure: CATARACT EXTRACTION PHACO AND INTRAOCULAR LENS PLACEMENT (IOC);  Surgeon: Birder Robson, MD;  Location: [facility] ORS;  Service: Ophthalmology;  Laterality: Right;  [place] 00:36 AP% 15.9 CDE 5.72 Fluid pack lot # [id number]  . CEREBRAL ANEURYSM REPAIR    .  CHOLECYSTECTOMY    . COLONOSCOPY WITH PROPOFOL N/A 04/26/2019   Procedure: COLONOSCOPY WITH PROPOFOL;  Surgeon: Jonathon Bellows, MD;  Location[facility] ENDOSCOPY;  Service: Gastroenterology;  Laterality: N/A;  . PORTACATH PLACEMENT Left 08/05/2020   Procedure: INSERTION PORT-A-CATH;  Surgeon: Herbert Pun, MD;  Location: [facility] ORS;  Service: General;  Laterality: Left;  . TUBAL LIGATION      Social History:  reports that she has been smoking cigarettes. She has a 50.00 pack-year smoking history. She has never used smokeless tobacco. She reports that she does not drink alcohol and does not use drugs.  Family History:  Family History  Problem Relation Age of Onset  . Hypertension Mother   . Heart failure Mother   . Hypertension Brother   . Stroke Brother   . Hypertension Son   . Emphysema Maternal Aunt   . Hypertension Paternal Aunt   . Hypertension Paternal Uncle   . Hypertension Maternal Grandmother   . Hypertension Paternal Grandmother      Prior to Admission medications   Medication Sig Start Date End Date Taking? Authorizing Provider  acetaminophen (TYLENOL 8 HOUR ARTHRITIS PAIN) 650 MG CR tablet Take 1,300 mg by mouth daily as needed (Pain).  Patient not taking: Reported on 09/02/2020    [provider]  acyclovir (ZOVIRAX) 400 MG tablet Take 1 tablet (400 mg total) by mouth 2 (two) times daily. 09/02/20   Earlie Server, MD  apixaban (ELIQUIS) 5 MG TABS tablet Take 1 tablet (5 mg total) by mouth 2 (two) times daily. 08/10/20   Minna Merritts, MD  b complex vitamins capsule Take 1 capsule by mouth daily. Gummie    [provider]  chlorhexidine (PERIDEX) 0.12 % solution Use as directed 10 mLs in the mouth or throat 2 (two) times daily. Swish and spit 08/13/20   Earlie Server, MD  dexamethasone (DECADRON) 4 MG tablet Take 1 tablet (4 mg total) by mouth daily. 08/19/20   Earlie Server, MD  docusate sodium (COLACE) 100 MG capsule Take 1 capsule (100 mg total) by mouth daily.  08/26/20   Earlie Server, MD  feeding supplement, ENSURE ENLIVE, (ENSURE ENLIVE) LIQD Take 237 mLs by mouth 3 (three) times daily between meals. 08/10/20   Fritzi Mandes, MD  fentaNYL (DURAGESIC) 25 MCG/HR Place 1 patch onto the skin every 3 (three) days. 08/19/20   Earlie Server, MD  gabapentin (NEURONTIN) 300 MG capsule Take 1 capsule (300 mg total) by mouth 3 (three) times daily. 07/19/20   Borders, Kirt Boys, NP  Ginkgo Biloba (GINKOBA PO) Take 1 tablet by mouth daily.  Patient not taking: Reported on 09/02/2020    [provider]  hydrALAZINE (APRESOLINE) 25 MG tablet Take 1 tablet (25 mg total) by mouth 2 (two) times daily. Patient not taking: Reported on 09/02/2020 08/10/20   Fritzi Mandes, MD  ibuprofen (ADVIL) 400 MG tablet Take 400 mg by mouth every 6 (six) hours as needed for mild pain or moderate pain. Liquid Gel    [provider]  lidocaine-prilocaine (EMLA)  cream Apply to affected area once Patient taking differently: Apply 1 application topically as needed [facility]). Apply to affected area once 07/29/20   Earlie Server, MD  lisinopril (ZESTRIL) 40 MG tablet Take 1 tablet (40 mg total) by mouth daily. 08/11/20   Fritzi Mandes, MD  Melatonin 10 MG TABS Take 10 mg by mouth at bedtime as needed (sleep).     [provider]  metoprolol tartrate (LOPRESSOR) 50 MG tablet Take 1 tablet (50 mg total) by mouth 2 (two) times daily. 08/10/20   Fritzi Mandes, MD  Multiple Vitamins-Minerals (CENTRUM SILVER PO) Take 1 tablet by mouth daily. Gummie    [provider]  nicotine (NICODERM CQ - DOSED IN MG/24 HOURS) 21 mg/24hr patch Place 1 patch (21 mg total) onto the skin daily. Patient not taking: Reported on 08/19/2020 08/11/20   Fritzi Mandes, MD  oxyCODONE-acetaminophen (PERCOCET) 5-325 MG tablet Take 1 tablet by mouth every 4 (four) hours as needed for severe pain. 08/12/20 08/12/21  Earlie Server, MD  polyethylene glycol powder (GLYCOLAX/MIRALAX) 17 GM/SCOOP powder Take 1 Container by mouth daily  as needed.    [provider]  potassium chloride SA (KLOR-CON) 20 MEQ tablet Take 1 tablet (20 mEq total) by mouth daily. 09/02/20   Earlie Server, MD  prochlorperazine (COMPAZINE) 10 MG tablet Take 1 tablet (10 mg total) by mouth every 6 (six) hours as needed (Nausea or vomiting). 07/29/20   Earlie Server, MD    Physical Exam: Vitals:   09/06/20 1600 09/06/20 1615 09/06/20 1630 09/06/20 1645  BP: (!) 120/94 137/77 123/81 125/78  Pulse: 92 88 91 92  Resp: 12 15 15 15   Temp:      TempSrc:      SpO2: 100% 97% 97% 97%  Weight:      Height:       General: Not in acute distress HEENT:       Eyes: PERRL, EOMI, no scleral icterus.       ENT: No discharge from the ears and nose, no pharynx injection, no tonsillar enlargement.        Neck: No JVD, no bruit, no mass felt. Heme: No neck lymph node enlargement. Cardiac: S1/S2, irregularly irregular rhythm, no murmurs, No gallops or rubs. Respiratory: Has coarse breathing sound bilaterally  GI: Soft, nondistended, nontender, no rebound pain, no organomegaly, BS present. GU: No hematuria Ext: No pitting leg edema bilaterally. 2+DP/PT pulse bilaterally. Musculoskeletal: No joint deformities, No joint redness or warmth, no limitation of ROM in spin. Skin: No rashes.  Neuro: Alert, oriented X3, cranial nerves II-XII grossly intact, moves all extremities normally.  Psych: Patient is not psychotic, no suicidal or hemocidal ideation.  Labs on Admission: I have personally reviewed following labs and imaging studies  CBC: Recent Labs  Lab 09/02/20 0815 09/04/20 1249 09/06/20 1141 09/06/20 1243  WBC 10.8* 7.5 6.7 8.2  NEUTROABS 9.1* 5.9 5.3 6.4  HGB 11.4* 9.8* 10.0* 10.0*  HCT 33.0* 29.5* 29.7* 30.7*  MCV 97.1 100.0 98.7 100.0  PLT 448* 367 332 094   Basic Metabolic Panel: Recent Labs  Lab 09/02/20 0815 09/04/20 1249 09/06/20 1141 09/06/20 1243  NA 134* 136 136 140  K 2.7* 3.3* 2.7* 2.8*  CL 91* 99 97* 99  CO2 30 27 27 26   GLUCOSE  122* 163* 133* 107*  BUN 13 18 13 12   CREATININE 0.47 0.48 0.49 0.39*  CALCIUM 8.7* 8.3* 7.9* 7.6*  MG 1.8 1.7 1.4* 1.4*   GFR: Estimated Creatinine Clearance:  56.9 mL/min (A) (by C-G formula based on SCr of 0.39 mg/dL (L)). Liver Function Tests: Recent Labs  Lab 09/02/20 0815 09/04/20 1249 09/06/20 1141 09/06/20 1243  AST 20 28 20 19   ALT 17 17 17 16   ALKPHOS 92 90 86 85  BILITOT 0.7 0.5 0.5 0.7  PROT 7.0 5.8* 5.7* 5.6*  ALBUMIN 3.1* 2.7* 2.6* 2.6*   No results for input(s): LIPASE, AMYLASE in the last 168 hours. No results for input(s): AMMONIA in the last 168 hours. Coagulation Profile: No results for input(s): INR, PROTIME in the last 168 hours. Cardiac Enzymes: No results for input(s): CKTOTAL, CKMB, CKMBINDEX, TROPONINI in the last 168 hours. BNP (last 3 results) No results for input(s): PROBNP in the last 8760 hours. HbA1C: No results for input(s): HGBA1C in the last 72 hours. CBG: No results for input(s): GLUCAP in the last 168 hours. Lipid Profile: No results for input(s): CHOL, HDL, LDLCALC, TRIG, CHOLHDL, LDLDIRECT in the last 72 hours. Thyroid Function Tests: No results for input(s): TSH, T4TOTAL, FREET4, T3FREE, THYROIDAB in the last 72 hours. Anemia Panel: No results for input(s): VITAMINB12, FOLATE, FERRITIN, TIBC, IRON, RETICCTPCT in the last 72 hours. Urine analysis:    Component Value Date/Time   COLORURINE YELLOW (A) 09/06/2020 1315   APPEARANCEUR CLEAR (A) 09/06/2020 1315   APPEARANCEUR Clear 12/21/2018 1041   LABSPEC 1.009 09/06/2020 1315   LABSPEC 1.017 04/23/2015 0000   PHURINE 7.0 09/06/2020 1315   GLUCOSEU NEGATIVE 09/06/2020 1315   HGBUR NEGATIVE 09/06/2020 [place] 09/06/2020 1315   BILIRUBINUR Negative 12/21/2018 1041   KETONESUR 5 (A) 09/06/2020 1315   PROTEINUR NEGATIVE 09/06/2020 1315   NITRITE NEGATIVE 09/06/2020 1315   LEUKOCYTESUR NEGATIVE 09/06/2020 1315   Sepsis  Labs: @LABRCNTIP (procalcitonin:4,lacticidven:4) ) Recent Results (from the past 240 hour(s))  Respiratory Panel by RT PCR (Flu A&B, Covid) - Nasopharyngeal Swab     Status: None   Collection Time: 09/06/20  1:01 PM   Specimen: Nasopharyngeal Swab  Result Value Ref Range Status   SARS Coronavirus 2 by RT PCR NEGATIVE NEGATIVE Final    Comment: (NOTE) SARS-CoV-2 target nucleic acids are NOT DETECTED.  The SARS-CoV-2 RNA is generally detectable in upper respiratoy specimens during the acute phase of infection. The lowest concentration of SARS-CoV-2 viral copies this assay can detect is 131 copies/mL. A negative result does not preclude SARS-Cov-2 infection and should not be used as the sole basis for treatment or other patient management decisions. A negative result may occur with  improper specimen collection/handling, submission of specimen other than nasopharyngeal swab, presence of viral mutation(s) within the areas targeted by this assay, and inadequate number of viral copies (<131 copies/mL). A negative result must be combined with clinical observations, patient history, and epidemiological information. The expected result is Negative.  Fact Sheet for Patients:  PinkCheek.be  Fact Sheet for Healthcare Providers:  GravelBags.it  This test is no t yet approved or cleared by the [place] FDA and  has been authorized for detection and/or diagnosis of SARS-CoV-2 by FDA under an Emergency Use Authorization (EUA). This EUA will remain  in effect (meaning this test can be used) for the duration of the COVID-19 declaration under Section 564(b)(1) of the Act, 21 U.S.C. section 360bbb-3(b)(1), unless the authorization is terminated or revoked sooner.     Influenza A by PCR NEGATIVE NEGATIVE Final   Influenza B by PCR NEGATIVE NEGATIVE Final    Comment: (NOTE) The Xpert Xpress SARS-CoV-2/FLU/RSV assay is intended  as an  aid in  the diagnosis of influenza from Nasopharyngeal swab specimens and  should not be used as a sole basis for treatment. Nasal washings and  aspirates are unacceptable for Xpert Xpress SARS-CoV-2/FLU/RSV  testing.  Fact Sheet for Patients: PinkCheek.be  Fact Sheet for Healthcare Providers: GravelBags.it  This test is not yet approved or cleared by the [place] FDA and  has been authorized for detection and/or diagnosis of SARS-CoV-2 by  FDA under an Emergency Use Authorization (EUA). This EUA will remain  in effect (meaning this test can be used) for the duration of the  Covid-19 declaration under Section 564(b)(1) of the Act, 21  U.S.C. section 360bbb-3(b)(1), unless the authorization is  terminated or revoked. Performed at [facility], [address] [place], [place] [address]      Radiological Exams on Admission: CT Chest W Contrast  Result Date: 09/06/2020 CLINICAL DATA:  Follow-up abnormal chest x-ray EXAM: CT CHEST WITH CONTRAST TECHNIQUE: Multidetector CT imaging of the chest was performed during intravenous contrast administration. CONTRAST:  38mL OMNIPAQUE IOHEXOL 300 MG/ML  SOLN COMPARISON:  Chest x-ray from earlier in the same day and CT from 08/08/2020. FINDINGS: Cardiovascular: Thoracic aorta demonstrates atherosclerotic calcifications. No dissection is noted. No aneurysmal dilatation is seen. The pulmonary artery as visualized shows no pulmonary embolism although not timed for true embolus evaluation. No cardiac enlargement is seen. No coronary calcifications are noted. Left-sided chest wall port is noted in satisfactory position. Mediastinum/Nodes: Thoracic inlet is within normal limits. Scattered right hilar and subcarinal lymph nodes are seen. The largest of the hilar lymph nodes measures approximately 13 mm in short axis. Subcarinal node measures 10 mm in short axis. The esophagus as visualized  is within normal limits. Lungs/Pleura: Lungs are well aerated bilaterally. The previously seen chest x-ray abnormality lies in the posterior aspect of the right upper lobe and is pleural base consistent with acute pneumonia. Patchy similar infiltrates are noted in the right middle lobe as well as the lingula on the left. The appearance of cavitary component seen on prior plain film is not borne out on the CT and likely projectional in nature. No sizable parenchymal nodule is seen. No effusion or pneumothorax is noted. Upper Abdomen: Visualized upper abdomen shows mild fatty infiltration of the liver. Musculoskeletal: Degenerative changes of the thoracic spine are noted. No acute rib abnormality is noted. No compression deformity is seen. IMPRESSION: Patchy multifocal infiltrate worst in the right upper lobe posteriorly and laterally consistent with that seen on recent plain film examination. This is new from the prior CT consistent with multifocal pneumonia. Followup PA and lateral chest X-ray is recommended in 3-4 weeks following trial of antibiotic therapy to ensure resolution. Fatty liver. No other focal abnormality is noted. Aortic Atherosclerosis (ICD10-I70.0). Electronically Signed   By: Inez Catalina M.D.   On: 09/06/2020 16:12   DG Chest Portable 1 View  Result Date: 09/06/2020 CLINICAL DATA:  Weakness.  Hypotension. EXAM: PORTABLE CHEST 1 VIEW COMPARISON:  CT 08/08/2020.  Chest x-ray 08/08/2020. FINDINGS: PowerPort catheter noted with tip over superior vena cava. Cardiomegaly. No pulmonary venous congestion. Multifocal right lung infiltrates noted. Tiny cavitary infiltrate right upper lung cannot be excluded. Mild left base atelectasis scratched it mild bibasilar atelectasis. No pleural effusion or pneumothorax. IMPRESSION: 1. PowerPort catheter with tip over superior vena cava. 2. Multifocal right lung infiltrates noted. Tiny cavitary infiltrate right upper lung cannot be excluded. Electronically Signed    By: Marcello Moores  Register  On: 09/06/2020 13:26     EKG: I have personally reviewed.  Atrial fibrillation with RVR, QTC 406, poor R wave progression, anteroseptal infarction pattern  Assessment/Plan Principal Problem:   Multifocal pneumonia Active Problems:   Hypertension   COPD (chronic obstructive pulmonary disease) (HCC)   Tobacco abuse   TIA (transient ischemic attack)   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HCC)   Atrial fibrillation with RVR (HCC)   Elevated troponin   Severe sepsis (HCC)   Severe sepsis due to multifocal pneumonia: Patient meets criteria for severe sepsis with tachycardia with heart rate up to 160s, tachypnea with RR 24.  Also has hypotension.  Blood pressure responded to IV fluid resuscitation, improved to 120/94 after giving 1 L normal saline bolus in ED.  Currently hemodynamically stable.  Lactic acid is normal.  Patient is immunosuppressed, will need antibiotics with broader coverage.  -Admitted to progressive unit as inpatient - IV Vancomycin and cefepime, azithromycin - Mucinex for cough  - Bronchodilators - Urine legionella and S. pneumococcal antigen - Follow up blood culture x2, sputum culture - will get Procalcitonin  - IVF: 1L of NS bolus in ED  Hypertension: -continue metoprolol for atrial fibrillation -Hold hydralazine and lisinopril due to hypotension  COPD (chronic obstructive pulmonary disease) (HCC) -Bronchodilators as above  Tobacco abuse -Nicotine patch  TIA (transient ischemic attack) -Patient is on Eliquis for atrial fibrillation  Maxillary sinus cancer [facility]): Patient is on chemo and radiation therapy, last chemo was on Monday, last radiation therapy was yesterday -Patient is taking Decadron -Give 100 mg of Solu-Cortef stress dose -Follow-up with oncologist, Dr. Tasia Catchings  Atrial fibrillation with RVR [facility]):: No chest pain. -Continue metoprolol -On Cardizem drip -Check TSH  Elevated troponin: Troponin 27 --> 25.  No chest pain.  Most likely  due to demand ischemia.  Will not trend troponin further -Check A1c, FLP -Repeat EKG in morning  Hypokalemia and hypomagnesemia: K= 2.8  on admission and Mg 1.4.  Patient cannot tolerate oral potassium due to mouth pain - Repleted with IV KCl 10 mEq x 7 - Give 2 g of magnesium sulfate   DVT ppx: on Eliquis Code Status: Full code Family Communication:  Yes, patient's close friend by phone Disposition Plan:  Anticipate discharge back to previous environment Consults called:  none Admission status:  progressive unit as inpt    Status is: Inpatient  Remains inpatient appropriate because:Inpatient level of care appropriate due to severity of illness.  Patient has multiple comorbidities, now presents with multifocal pneumonia, severe sepsis with hypotension, also has A. fib with RVR, positive troponin, electrolytes disturbance.  Her presentation is highly complicated.  Patient is immunosuppressed, she is at high risk of deteriorating.  Patient will need to be treated in hospital for at least 2 days.   Dispo: The patient is from: Home              Anticipated d/c is to: Home              Anticipated d/c date is: 2 days              Patient currently is not medically stable to d/c.           Date of Service 09/06/2020    Ivor Costa [facility]   If 7PM-7AM, please contact night-coverage [email or web address] 09/06/2020, 6:11 PM

## 2020-09-07 LAB — CBC
HCT: 31.8 % — ABNORMAL LOW (ref 36.0–46.0)
Hemoglobin: 10.7 g/dL — ABNORMAL LOW (ref 12.0–15.0)
MCH: 33 pg (ref 26.0–34.0)
MCHC: 33.6 g/dL (ref 30.0–36.0)
MCV: 98.1 fL (ref 80.0–100.0)
Platelets: 353 10*3/uL (ref 150–400)
RBC: 3.24 MIL/uL — ABNORMAL LOW (ref 3.87–5.11)
RDW: 13.2 % (ref 11.5–15.5)
WBC: 4.5 10*3/uL (ref 4.0–10.5)
nRBC: 0 % (ref 0.0–0.2)

## 2020-09-07 LAB — HEMOGLOBIN A1C
Hgb A1c MFr Bld: 5.4 % (ref 4.8–5.6)
Mean Plasma Glucose: 108.28 mg/dL

## 2020-09-07 LAB — BASIC METABOLIC PANEL
Anion gap: 13 (ref 5–15)
BUN: 8 mg/dL (ref 8–23)
CO2: 24 mmol/L (ref 22–32)
Calcium: 7.7 mg/dL — ABNORMAL LOW (ref 8.9–10.3)
Chloride: 94 mmol/L — ABNORMAL LOW (ref 98–111)
Creatinine, Ser: 0.45 mg/dL (ref 0.44–1.00)
GFR, Estimated: >60 mL/min (ref >60)
Glucose, Bld: 117 mg/dL — ABNORMAL HIGH (ref 70–99)
Potassium: 3.3 mmol/L — ABNORMAL LOW (ref 3.5–5.1)
Sodium: 131 mmol/L — ABNORMAL LOW (ref 135–145)

## 2020-09-07 LAB — MAGNESIUM: Magnesium: 1.6 mg/dL — ABNORMAL LOW (ref 1.7–2.4)

## 2020-09-07 LAB — HIV ANTIBODY (ROUTINE TESTING W REFLEX): HIV Screen 4th Generation wRfx: NONREACTIVE

## 2020-09-07 LAB — STREP PNEUMONIAE URINARY ANTIGEN: Strep Pneumo Urinary Antigen: NEGATIVE

## 2020-09-07 LAB — TSH: TSH: 3.199 u[IU]/mL (ref 0.350–4.500)

## 2020-09-07 MED ORDER — SODIUM CHLORIDE 0.9 % IV SOLN
INTRAVENOUS | Status: DC | PRN
  Administered 2020-09-09: 250 mL via INTRAVENOUS
  Administered 2020-09-11: 1000 mL via INTRAVENOUS

## 2020-09-07 MED ORDER — SODIUM CHLORIDE 0.9% FLUSH: 10.0000 mL | INTRAVENOUS | Status: DC | PRN

## 2020-09-07 MED ORDER — POTASSIUM CHLORIDE 20 MEQ PO PACK
40.0000 meq | PACK | Freq: Once | ORAL | Status: DC
  Filled 2020-09-07: qty 2

## 2020-09-07 MED ORDER — SODIUM CHLORIDE 0.9% FLUSH
10.0000 mL | Freq: Two times a day (BID) | INTRAVENOUS | Status: DC
  Administered 2020-09-08 – 2020-09-11 (×5): 10 mL

## 2020-09-07 MED ORDER — CHLORHEXIDINE GLUCONATE CLOTH 2 % EX PADS
6.0000 | MEDICATED_PAD | Freq: Every day | CUTANEOUS | Status: DC
  Administered 2020-09-08 – 2020-09-09 (×2): 6 via TOPICAL

## 2020-09-07 NOTE — ED Notes (Signed)
Report to Kayla, RN 

## 2020-09-07 NOTE — ED Notes (Signed)
Pt resting quietly at this time. No distress noted. Pt denies any needs at this time. Call bell in reach.

## 2020-09-07 NOTE — ED Notes (Signed)
Advised nurse that patient has assigned bed 

## 2020-09-07 NOTE — ED Notes (Signed)
Pt resting comfortably at this time. No current distress noted. Pt is A&Ox4. Call bell in reach.

## 2020-09-07 NOTE — Hospital Course (Signed)
Shannon Schroeder is a 64 y.o. 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  She was found to be in A-fib with RVR, rates up to 150-160's.  Also hypotensive 83/68 which responded to IV fluids.  Evaluation in the ED revealed multifocal pneumonia.  Started on broad spectrum antibiotics and admitted to hospitalist service.    Regarding maxillary cancer, this is a recent diagnosis in early Sept after pt presented with ongoing progressive facial pain.  She is followed by Dr. Tasia Catchings and currently undergoing chemo and radiation therapy.  Last cycle of chemotherapy was Monday 11/1.  She was to receive radiation on 11/5, but sent to ED as outlined above.

## 2020-09-07 NOTE — Progress Notes (Signed)
PROGRESS NOTE    Shannon Schroeder   WNI:[id number]  DOB: Feb 01, 1956  PCP: Pcp, No    DOA: 09/06/2020 LOS: 1   Brief Narrative   Shannon Schroeder is a 64 y.o. 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  She was found to be in A-fib with RVR, rates up to 150-160's.  Also hypotensive 83/68 which responded to IV fluids.  Evaluation in the ED revealed multifocal pneumonia.  Started on broad spectrum antibiotics and admitted to hospitalist service.    Regarding maxillary cancer, this is a recent diagnosis in early Sept after pt presented with ongoing progressive facial pain.  She is followed by Dr. Tasia Catchings and currently undergoing chemo and radiation therapy.  Last cycle of chemotherapy was Monday 11/1.  She was to receive radiation on 11/5, but sent to ED as outlined above.     Assessment & Plan   Principal Problem:   Multifocal pneumonia Active Problems:   Hypertension   COPD (chronic obstructive pulmonary disease) (HCC)   Tobacco abuse   TIA (transient ischemic attack)   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HCC)   Atrial fibrillation with RVR (HCC)   Elevated troponin   Severe sepsis (HCC)   Severe sepsis - present on admission, due to PNA.  Severe sepsis evidenced by tachycardia, tachypnea, hypotension reflecting organ dysfunction, in setting on PNA.  Treated per protocol in the ED with IV fluid resuscitation and IV antibiotics.  Multifocal Pneumonia - present on admission as outlined above.  Given frequent healthcare visits for cancer recently, covering for healthcare-associated PNA.   CT chest showed infiltrates worse in posterior RUL, but also in RML and lingula on left.  No cavitary component seen on CT as  was in xray report. Strep pneumo antigen negative. --Continue broad spectrum antibiotics: Vancomycin, Cefepime, Zithromax --F/u urinary antigens for Legionella --F/u blood and sputum cultures --Mucinex --PRN albuterol  A-fib with RVR - POA with HR's 150-160's.  No chest pain.  RVR being driven by infection.  TSH normal. 11/5: Rate improved, intermittently spikes into 120's per charted vitals --On Cardizem drip, continue --Continue Eliquis and metoprolol --Monitor on telemetry  Elevated troponin - POA, mild 27>>25, likely demand ischemia in setting of hypotension and A-fib RVR.  No chest pain.  Monitor.  Electrolyte derangements - presented with hypokalemia (K 2.8) and hypomagnesemia (Mg 1.4), which were replaced.  Monitor BMP, Mg.  Replace further as needed.  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  Follows with Dr. Tasia Catchings and currently on chemotherapy and radiation.  Last chemo Monday 11/1.  Was to have radiation day of admission, but found hypotensive and in rapid A-fib, now admitted, treatment was not able to be done. --Continue Decadron --Diet as tolerated, dysphagia 3 is ordered - can change per patient preference  Essential hypertension - hypotensive on admission. --Continue metoprolol for A-fib --Hold home hydralazine and lisinopril until BP improves  COPD - stable, not acutely exacerbated.  Bronchodilators as above.  Hx of TIA - on Eliquis for A-fib  Tobacco abuse - nicotine patch.  Counseled regarding cessation.    DVT prophylaxis:  apixaban (ELIQUIS) tablet 5 mg   Diet:  Diet Orders (From admission, onward)    Start     Ordered   09/06/20 1927  Diet Heart Room service appropriate? Yes; Fluid consistency: Thin  Diet effective now       Question Answer Comment  Room service appropriate? Yes   Fluid consistency: Thin      09/06/20 1926            Code Status: Full Code    Subjective 09/07/20    Pt seen in ED holding for a bed this  morning.  She reports feeling very weak, ongoing dry cough.  Palpitations better.  No chest pain.  Says overall feels okay.     Disposition Plan & Communication   Status is: Inpatient  Remains inpatient appropriate because:IV treatments appropriate due to intensity of illness or inability to take PO   Dispo: The patient is from: Home              Anticipated d/c is to: Home              Anticipated d/c date is: 2-3 days              Patient currently is not medically stable to d/c.    Family Communication: none at bedside during encounter.  Attempted to call [name], got voicemail.  Will try later or tomorrow.    Contact person is patient's roommate, [name] [phone]    Consults, Procedures, Significant Events   Consultants:   None  Procedures:   None  Antimicrobials:  Anti-infectives (From admission, onward)   Start     Dose/Rate Route Frequency Ordered Stop   09/07/20 1600  azithromycin (ZITHROMAX) 500 mg in sodium chloride 0.9 % 250 mL IVPB        500 mg 250 mL/hr over 60 Minutes Intravenous Every 24 hours 09/06/20 1702     09/07/20 0800  vancomycin (VANCOREADY) IVPB 500 mg/100 mL        500 mg 100 mL/hr over 60 Minutes Intravenous Every 12 hours 09/06/20 1717     09/06/20 2200  ceFEPIme (MAXIPIME) 2 g in sodium chloride 0.9 % 100 mL IVPB        2 g 200 mL/hr over 30 Minutes Intravenous Every 12 hours 09/06/20 1717     09/06/20 1630  vancomycin (VANCOCIN) IVPB 1000 mg/200 mL premix        1,000 mg 200 mL/hr over 60 Minutes Intravenous  Once 09/06/20 1626 09/06/20 1940   09/06/20 1630  ceFEPIme (MAXIPIME) 2 g in sodium chloride 0.9 % 100 mL IVPB        2 g 200 mL/hr over 30 Minutes Intravenous  Once 09/06/20 1626 09/06/20 1709   09/06/20 1630  azithromycin (ZITHROMAX) 500 mg in sodium chloride 0.9 % 250 mL IVPB        500 mg 250 mL/hr over 60 Minutes Intravenous  Once 09/06/20 1626 09/06/20 1752         Objective   Vitals:   09/07/20 0645 09/07/20 0700  09/07/20 0815 09/07/20 0900  BP: 124/83 129/90 104/70 122/71  Pulse: 95 (!) 114 87 (!) 105  Resp: 11 10  10   Temp:      TempSrc:      SpO2: 96% 97% 95% 97%  Weight:      Height:        Intake/Output Summary (Last 24 hours) at 09/07/2020 0907 Last data filed at 09/07/2020 0221 Gross per 24 hour  Intake 2653.19 ml  Output --  Net 2653.19 ml   Filed Weights   09/06/20 1246  Weight: 51.4 kg  Physical Exam:  General exam: awake, alert, no acute distress HEENT: right facial swelling, muffled voice, moist mucus membranes, hearing grossly normal  Respiratory system: rhonchi, no wheezes, normal respiratory effort, on room air. Cardiovascular system: normal S1/S2, irregularly irregular, no pedal edema.   Gastrointestinal system: soft, NT, ND, +bowel sounds. Central nervous system: A&O x4. no gross focal neurologic deficits, normal speech Extremities: moves all, no cyanosis, normal tone Psychiatry: normal mood, congruent affect, judgement and insight appear normal  Labs   Data Reviewed: I have personally reviewed following labs and imaging studies  CBC: Recent Labs  Lab 09/02/20 0815 09/04/20 1249 09/06/20 1141 09/06/20 1243 09/07/20 0802  WBC 10.8* 7.5 6.7 8.2 4.5  NEUTROABS 9.1* 5.9 5.3 6.4  --   HGB 11.4* 9.8* 10.0* 10.0* 10.7*  HCT 33.0* 29.5* 29.7* 30.7* 31.8*  MCV 97.1 100.0 98.7 100.0 98.1  PLT 448* 367 332 338 725   Basic Metabolic Panel: Recent Labs  Lab 09/02/20 0815 09/04/20 1249 09/06/20 1141 09/06/20 1243  NA 134* 136 136 140  K 2.7* 3.3* 2.7* 2.8*  CL 91* 99 97* 99  CO2 30 27 27 26   GLUCOSE 122* 163* 133* 107*  BUN 13 18 13 12   CREATININE 0.47 0.48 0.49 0.39*  CALCIUM 8.7* 8.3* 7.9* 7.6*  MG 1.8 1.7 1.4* 1.4*   GFR: Estimated Creatinine Clearance: 56.9 mL/min (A) (by C-G formula based on SCr of 0.39 mg/dL (L)). Liver Function Tests: Recent Labs  Lab 09/02/20 0815 09/04/20 1249 09/06/20 1141 09/06/20 1243  AST 20 28 20 19   ALT 17 17 17  16   ALKPHOS 92 90 86 85  BILITOT 0.7 0.5 0.5 0.7  PROT 7.0 5.8* 5.7* 5.6*  ALBUMIN 3.1* 2.7* 2.6* 2.6*   No results for input(s): LIPASE, AMYLASE in the last 168 hours. No results for input(s): AMMONIA in the last 168 hours. Coagulation Profile: No results for input(s): INR, PROTIME in the last 168 hours. Cardiac Enzymes: No results for input(s): CKTOTAL, CKMB, CKMBINDEX, TROPONINI in the last 168 hours. BNP (last 3 results) No results for input(s): PROBNP in the last 8760 hours. HbA1C: No results for input(s): HGBA1C in the last 72 hours. CBG: No results for input(s): GLUCAP in the last 168 hours. Lipid Profile: No results for input(s): CHOL, HDL, LDLCALC, TRIG, CHOLHDL, LDLDIRECT in the last 72 hours. Thyroid Function Tests: No results for input(s): TSH, T4TOTAL, FREET4, T3FREE, THYROIDAB in the last 72 hours. Anemia Panel: No results for input(s): VITAMINB12, FOLATE, FERRITIN, TIBC, IRON, RETICCTPCT in the last 72 hours. Sepsis Labs: Recent Labs  Lab 09/06/20 1243  PROCALCITON <0.10  LATICACIDVEN 1.9    Recent Results (from the past 240 hour(s))  Blood culture (routine x 2)     Status: None (Preliminary result)   Collection Time: 09/06/20 12:53 PM   Specimen: BLOOD  Result Value Ref Range Status   Specimen Description BLOOD RAC  Final   Special Requests   Final    BOTTLES DRAWN AEROBIC AND ANAEROBIC Blood Culture adequate volume   Culture   Final    NO GROWTH < 24 HOURS Performed at [facility], [address] [place], [place] [address]    Report Status PENDING  Incomplete  Respiratory Panel by RT PCR (Flu A&B, Covid) - Nasopharyngeal Swab     Status: None   Collection Time: 09/06/20  1:01 PM   Specimen: Nasopharyngeal Swab  Result Value Ref Range Status   SARS Coronavirus 2 by RT PCR NEGATIVE NEGATIVE Final  Comment: (NOTE) SARS-CoV-2 target nucleic acids are NOT DETECTED.  The SARS-CoV-2 RNA is generally detectable in upper respiratoy specimens  during the acute phase of infection. The lowest concentration of SARS-CoV-2 viral copies this assay can detect is 131 copies/mL. A negative result does not preclude SARS-Cov-2 infection and should not be used as the sole basis for treatment or other patient management decisions. A negative result may occur with  improper specimen collection/handling, submission of specimen other than nasopharyngeal swab, presence of viral mutation(s) within the areas targeted by this assay, and inadequate number of viral copies (<131 copies/mL). A negative result must be combined with clinical observations, patient history, and epidemiological information. The expected result is Negative.  Fact Sheet for Patients:  PinkCheek.be  Fact Sheet for Healthcare Providers:  GravelBags.it  This test is no t yet approved or cleared by the [place] FDA and  has been authorized for detection and/or diagnosis of SARS-CoV-2 by FDA under an Emergency Use Authorization (EUA). This EUA will remain  in effect (meaning this test can be used) for the duration of the COVID-19 declaration under Section 564(b)(1) of the Act, 21 U.S.C. section 360bbb-3(b)(1), unless the authorization is terminated or revoked sooner.     Influenza A by PCR NEGATIVE NEGATIVE Final   Influenza B by PCR NEGATIVE NEGATIVE Final    Comment: (NOTE) The Xpert Xpress SARS-CoV-2/FLU/RSV assay is intended as an aid in  the diagnosis of influenza from Nasopharyngeal swab specimens and  should not be used as a sole basis for treatment. Nasal washings and  aspirates are unacceptable for Xpert Xpress SARS-CoV-2/FLU/RSV  testing.  Fact Sheet for Patients: PinkCheek.be  Fact Sheet for Healthcare Providers: GravelBags.it  This test is not yet approved or cleared by the [place] FDA and  has been authorized for detection and/or  diagnosis of SARS-CoV-2 by  FDA under an Emergency Use Authorization (EUA). This EUA will remain  in effect (meaning this test can be used) for the duration of the  Covid-19 declaration under Section 564(b)(1) of the Act, 21  U.S.C. section 360bbb-3(b)(1), unless the authorization is  terminated or revoked. Performed at [facility], [address] [place], [place] [address]   Blood culture (routine x 2)     Status: None (Preliminary result)   Collection Time: 09/06/20  1:15 PM   Specimen: BLOOD  Result Value Ref Range Status   Specimen Description BLOOD LA  Final   Special Requests   Final    BOTTLES DRAWN AEROBIC AND ANAEROBIC Blood Culture adequate volume   Culture   Final    NO GROWTH < 24 HOURS Performed at [facility], [address] [place], [place] [address]    Report Status PENDING  Incomplete  MRSA PCR Screening     Status: Abnormal   Collection Time: 09/06/20  5:10 PM   Specimen: Nasopharyngeal  Result Value Ref Range Status   MRSA by PCR POSITIVE (A) NEGATIVE Final    Comment:        The GeneXpert MRSA Assay (FDA approved for NASAL specimens only), is one component of a comprehensive MRSA colonization surveillance program. It is not intended to diagnose MRSA infection nor to guide or monitor treatment for MRSA infections. RESULT CALLED TO, READ BACK BY AND VERIFIED WITH: LINDSAY BLACK ON 09/06/20 AT 2130 QSD Performed at [facility], [address] [place], [place] [address]   Culture, sputum-assessment     Status: None   Collection Time: 09/06/20  7:27 PM   Specimen: Sputum  Result Value Ref Range Status   Specimen Description SPUTUM  Final   Special Requests NONE  Final   Sputum evaluation   Final    THIS SPECIMEN IS ACCEPTABLE FOR SPUTUM CULTURE Performed at [facility], [address] [place], [place] [address]    Report Status 09/06/2020 FINAL  Final  Culture, respiratory     Status: None (Preliminary  result)   Collection Time: 09/06/20  7:27 PM   Specimen: SPU  Result Value Ref Range Status   Specimen Description   Final    SPUTUM Performed at [facility], [address], [place], [place] [address]    Special Requests   Final    NONE Reflexed from [id number] Performed at [facility], [address] [place], [place] [address]    Gram Stain   Final    ABUNDANT WBC PRESENT, PREDOMINANTLY PMN MODERATE GRAM POSITIVE COCCI FEW GRAM POSITIVE RODS RARE GRAM NEGATIVE RODS Performed at [facility], [place] [address], [place], [place] [address]    Culture PENDING  Incomplete   Report Status PENDING  Incomplete      Imaging Studies   CT Chest W Contrast  Result Date: 09/06/2020 CLINICAL DATA:  Follow-up abnormal chest x-ray EXAM: CT CHEST WITH CONTRAST TECHNIQUE: Multidetector CT imaging of the chest was performed during intravenous contrast administration. CONTRAST:  5mL OMNIPAQUE IOHEXOL 300 MG/ML  SOLN COMPARISON:  Chest x-ray from earlier in the same day and CT from 08/08/2020. FINDINGS: Cardiovascular: Thoracic aorta demonstrates atherosclerotic calcifications. No dissection is noted. No aneurysmal dilatation is seen. The pulmonary artery as visualized shows no pulmonary embolism although not timed for true embolus evaluation. No cardiac enlargement is seen. No coronary calcifications are noted. Left-sided chest wall port is noted in satisfactory position. Mediastinum/Nodes: Thoracic inlet is within normal limits. Scattered right hilar and subcarinal lymph nodes are seen. The largest of the hilar lymph nodes measures approximately 13 mm in short axis. Subcarinal node measures 10 mm in short axis. The esophagus as visualized is within normal limits. Lungs/Pleura: Lungs are well aerated bilaterally. The previously seen chest x-ray abnormality lies in the posterior aspect of the right upper lobe and is pleural base consistent with acute pneumonia. Patchy similar  infiltrates are noted in the right middle lobe as well as the lingula on the left. The appearance of cavitary component seen on prior plain film is not borne out on the CT and likely projectional in nature. No sizable parenchymal nodule is seen. No effusion or pneumothorax is noted. Upper Abdomen: Visualized upper abdomen shows mild fatty infiltration of the liver. Musculoskeletal: Degenerative changes of the thoracic spine are noted. No acute rib abnormality is noted. No compression deformity is seen. IMPRESSION: Patchy multifocal infiltrate worst in the right upper lobe posteriorly and laterally consistent with that seen on recent plain film examination. This is new from the prior CT consistent with multifocal pneumonia. Followup PA and lateral chest X-ray is recommended in 3-4 weeks following trial of antibiotic therapy to ensure resolution. Fatty liver. No other focal abnormality is noted. Aortic Atherosclerosis (ICD10-I70.0). Electronically Signed   By: Inez Catalina M.D.   On: 09/06/2020 16:12   DG Chest Portable 1 View  Result Date: 09/06/2020 CLINICAL DATA:  Weakness.  Hypotension. EXAM: PORTABLE CHEST 1 VIEW COMPARISON:  CT 08/08/2020.  Chest x-ray 08/08/2020. FINDINGS: PowerPort catheter noted with tip over superior vena cava. Cardiomegaly. No pulmonary venous congestion. Multifocal right  lung infiltrates noted. Tiny cavitary infiltrate right upper lung cannot be excluded. Mild left base atelectasis scratched it mild bibasilar atelectasis. No pleural effusion or pneumothorax. IMPRESSION: 1. PowerPort catheter with tip over superior vena cava. 2. Multifocal right lung infiltrates noted. Tiny cavitary infiltrate right upper lung cannot be excluded. Electronically Signed   By: Marcello Moores  Register   On: 09/06/2020 13:26     Medications   Scheduled Meds: . apixaban  5 mg Oral BID  . chlorhexidine  10 mL Mouth/Throat BID  . dexamethasone  4 mg Oral Daily  . docusate sodium  100 mg Oral Daily  .  feeding supplement  237 mL Oral TID BM  . fentaNYL  1 patch Transdermal Q72H  . gabapentin  300 mg Oral TID  . metoprolol tartrate  50 mg Oral BID  . multivitamin with minerals  1 tablet Oral Daily  . nicotine  21 mg Transdermal Daily   Continuous Infusions: . azithromycin    . ceFEPime (MAXIPIME) IV Stopped (09/06/20 2219)  . diltiazem (CARDIZEM) infusion 5 mg/hr (09/07/20 0223)  . vancomycin         LOS: 1 day    Time spent: 30 minutes    Ezekiel Slocumb, DO [facility]  09/07/2020, 9:07 AM    If 7PM-7AM, please contact night-coverage. How to contact the [facility] Attending or Consulting provider [place] or covering provider during after hours [place], for this patient?    1. Check the care team in [facility] and look for a) attending/consulting [facility] provider listed and b) the [facility] team listed 2. Log into [email or web address] and use [facility] universal password to access. If you do not have the password, please contact the hospital operator. 3. Locate the [facility] provider you are looking for under [facility] and page to a number that you can be directly reached. 4. If you still have difficulty reaching the provider, please page the [facility] (Director on Call) for the Hospitalists listed on amion for assistance.

## 2020-09-07 NOTE — ED Notes (Signed)
Pt resting comfortably at this time. Pt requested to use bedside toilet, pt did ambulate with steady gait and was a one assist. Pt's bed changed for comfort, new warm blanket provided. Pt is A&Ox4. Pt states feeling better. Lab called to assist with morning blood draw. Pt still tachy at this time.

## 2020-09-07 NOTE — Plan of Care (Signed)
  Problem: Education: Goal: Knowledge of General Education information will improve Description: Including pain rating scale, medication(s)/side effects and non-pharmacologic comfort measures 09/07/2020 1707 by Cristela Blue, RN Outcome: Progressing 09/07/2020 1707 by Cristela Blue, RN Outcome: Progressing   Problem: Health Behavior/Discharge Planning: Goal: Ability to manage health-related needs will improve 09/07/2020 1707 by Cristela Blue, RN Outcome: Progressing 09/07/2020 1707 by Cristela Blue, RN Outcome: Progressing   Problem: Clinical Measurements: Goal: Ability to maintain clinical measurements within normal limits will improve 09/07/2020 1707 by Cristela Blue, RN Outcome: Progressing 09/07/2020 1707 by Cristela Blue, RN Outcome: Progressing Goal: Will remain free from infection 09/07/2020 1707 by Cristela Blue, RN Outcome: Progressing 09/07/2020 1707 by Cristela Blue, RN Outcome: Progressing Goal: Diagnostic test results will improve 09/07/2020 1707 by Cristela Blue, RN Outcome: Progressing 09/07/2020 1707 by Cristela Blue, RN Outcome: Progressing Goal: Respiratory complications will improve 09/07/2020 1707 by Cristela Blue, RN Outcome: Progressing 09/07/2020 1707 by Cristela Blue, RN Outcome: Progressing Goal: Cardiovascular complication will be avoided 09/07/2020 1707 by Cristela Blue, RN Outcome: Progressing 09/07/2020 1707 by Cristela Blue, RN Outcome: Progressing   Problem: Activity: Goal: Risk for activity intolerance will decrease 09/07/2020 1707 by Cristela Blue, RN Outcome: Progressing 09/07/2020 1707 by Cristela Blue, RN Outcome: Progressing   Problem: Nutrition: Goal: Adequate nutrition will be maintained 09/07/2020 1707 by Cristela Blue, RN Outcome: Progressing 09/07/2020 1707 by Cristela Blue, RN Outcome: Progressing   Problem: Coping: Goal: Level of anxiety will decrease 09/07/2020 1707 by Cristela Blue, RN Outcome:  Progressing 09/07/2020 1707 by Cristela Blue, RN Outcome: Progressing   Problem: Elimination: Goal: Will not experience complications related to bowel motility 09/07/2020 1707 by Cristela Blue, RN Outcome: Progressing 09/07/2020 1707 by Cristela Blue, RN Outcome: Progressing Goal: Will not experience complications related to urinary retention 09/07/2020 1707 by Cristela Blue, RN Outcome: Progressing 09/07/2020 1707 by Cristela Blue, RN Outcome: Progressing   Problem: Pain Managment: Goal: General experience of comfort will improve 09/07/2020 1707 by Cristela Blue, RN Outcome: Progressing 09/07/2020 1707 by Cristela Blue, RN Outcome: Progressing   Problem: Safety: Goal: Ability to remain free from injury will improve 09/07/2020 1707 by Cristela Blue, RN Outcome: Progressing 09/07/2020 1707 by Cristela Blue, RN Outcome: Progressing   Problem: Skin Integrity: Goal: Risk for impaired skin integrity will decrease 09/07/2020 1707 by Cristela Blue, RN Outcome: Progressing 09/07/2020 1707 by Cristela Blue, RN Outcome: Progressing

## 2020-09-08 LAB — CBC
HCT: 32.3 % — ABNORMAL LOW (ref 36.0–46.0)
Hemoglobin: 11.1 g/dL — ABNORMAL LOW (ref 12.0–15.0)
MCH: 33.4 pg (ref 26.0–34.0)
MCHC: 34.4 g/dL (ref 30.0–36.0)
MCV: 97.3 fL (ref 80.0–100.0)
Platelets: 363 10*3/uL (ref 150–400)
RBC: 3.32 MIL/uL — ABNORMAL LOW (ref 3.87–5.11)
RDW: 13 % (ref 11.5–15.5)
WBC: 5 10*3/uL (ref 4.0–10.5)
nRBC: 0 % (ref 0.0–0.2)

## 2020-09-08 LAB — BASIC METABOLIC PANEL
Anion gap: 9 (ref 5–15)
BUN: 10 mg/dL (ref 8–23)
CO2: 27 mmol/L (ref 22–32)
Calcium: 7.9 mg/dL — ABNORMAL LOW (ref 8.9–10.3)
Chloride: 96 mmol/L — ABNORMAL LOW (ref 98–111)
Creatinine, Ser: 0.52 mg/dL (ref 0.44–1.00)
GFR, Estimated: >60 mL/min (ref >60)
Glucose, Bld: 112 mg/dL — ABNORMAL HIGH (ref 70–99)
Potassium: 3.6 mmol/L (ref 3.5–5.1)
Sodium: 132 mmol/L — ABNORMAL LOW (ref 135–145)

## 2020-09-08 LAB — CULTURE, RESPIRATORY W GRAM STAIN: Culture: NORMAL

## 2020-09-08 LAB — MAGNESIUM: Magnesium: 1.7 mg/dL (ref 1.7–2.4)

## 2020-09-08 MED ORDER — PROCHLORPERAZINE MALEATE 10 MG PO TABS
10.0000 mg | ORAL_TABLET | Freq: Four times a day (QID) | ORAL | Status: DC | PRN
  Filled 2020-09-08: qty 1

## 2020-09-08 MED ORDER — MAGNESIUM SULFATE 2 GM/50ML IV SOLN
2.0000 g | Freq: Once | INTRAVENOUS | Status: AC
  Administered 2020-09-08: 2 g via INTRAVENOUS
  Filled 2020-09-08: qty 50

## 2020-09-08 MED ORDER — BOOST / RESOURCE BREEZE PO LIQD CUSTOM
1.0000 | Freq: Three times a day (TID) | ORAL | Status: DC
  Administered 2020-09-08 – 2020-09-09 (×3): 1 via ORAL

## 2020-09-08 MED ORDER — SODIUM CHLORIDE 0.9 % IV SOLN: INTRAVENOUS | Status: DC

## 2020-09-08 MED ORDER — POTASSIUM CHLORIDE IN NACL 40-0.9 MEQ/L-% IV SOLN
INTRAVENOUS | Status: DC
  Filled 2020-09-08 (×2): qty 1000

## 2020-09-08 MED ORDER — POTASSIUM CHLORIDE CRYS ER 20 MEQ PO TBCR
40.0000 meq | EXTENDED_RELEASE_TABLET | Freq: Once | ORAL | Status: DC
  Filled 2020-09-08: qty 2

## 2020-09-08 MED ORDER — DOXYCYCLINE HYCLATE 100 MG PO TABS
100.0000 mg | ORAL_TABLET | Freq: Two times a day (BID) | ORAL | Status: DC
  Administered 2020-09-08 – 2020-09-11 (×7): 100 mg via ORAL
  Filled 2020-09-08 (×7): qty 1

## 2020-09-08 MED ORDER — SODIUM CHLORIDE 0.9 % IV SOLN
2.0000 g | INTRAVENOUS | Status: DC
  Administered 2020-09-08 – 2020-09-10 (×3): 2 g via INTRAVENOUS
  Filled 2020-09-08: qty 2
  Filled 2020-09-08: qty 20
  Filled 2020-09-08: qty 2
  Filled 2020-09-08: qty 20

## 2020-09-08 NOTE — Progress Notes (Signed)
Initial Nutrition Assessment  DOCUMENTATION CODES:   Not applicable  INTERVENTION:   Boost Breeze po TID, each supplement provides 250 kcal and 9 grams of protein  Magic cup TID with meals, each supplement provides 290 kcal and 9 grams of protein  MVI daily   Plastic silverware with meal trays   NUTRITION DIAGNOSIS:   Increased nutrient needs related to cancer and cancer related treatments as evidenced by estimated needs.  GOAL:   Patient will meet greater than or equal to 90% of their needs  MONITOR:   PO intake, Supplement acceptance, Labs, Weight trends, Skin, I & O's  REASON FOR ASSESSMENT:   Malnutrition Screening Tool    ASSESSMENT:   64 y.o. 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 Pt found to have PNA and sepsis  RD working remotely.  Spoke with pt via phone. Pt reports fairly good appetite and oral intake pta. Pt reports that her appetite is good today. Pt documented to have only eaten only bites of her dinner last night and breakfast this morning. Pt reports that she only ate bites because she was sent real silverware and she is only able to eat with plastic. Pt reports that she used to drink chocolate Boost and Ensure but reports that she no longer drinks this because it gives her diarrhea. Pt reports that she is able to drink the clear supplements. RD will add supplements and MVI to help pt meet her estimated needs. RD will also note in [facility] for pt to receive plastic silverware with her meal trays. Of note, pt is followed by the RD at the [facility]. Pt has been offered G-tube placement and refuses.   Per chart, pt down 30lbs(18%) in < 1 year; this is significant. Pt does appear to be weight stable for the  past two months.   Medications reviewed and include: dexamethasone, colace, nicotine, KCl, NaCl w/ KCl @75ml /hr, cefepime, vancomycin   Labs reviewed: Na 132(L), Mg 1.7 wnl   NUTRITION - FOCUSED PHYSICAL EXAM: Unable to perform at this time   Diet Order:   Diet Order            DIET DYS 3 Room service appropriate? Yes; Fluid consistency: Thin  Diet effective now                EDUCATION NEEDS:   Education needs have been addressed  Skin:  Skin Assessment: Reviewed RN Assessment  Last BM:  11/5  Height:   Ht Readings from Last 1 Encounters:  09/07/20 5\' 6"  (1.676 m)    Weight:   Wt Readings from Last 1 Encounters:  09/07/20 61.2 kg    Ideal Body Weight:  59 kg  BMI:  Body mass index is 21.77 kg/m.  Estimated Nutritional Needs:   Kcal:  1600-1800kcal/day  Protein:  80-90g/day  Fluid:  >1.6L/day  Koleen Distance MS, RD, LDN Please refer to [facility] for RD and/or RD on-call/weekend/after hours pager

## 2020-09-08 NOTE — Plan of Care (Signed)
°  Problem: Education: Goal: Knowledge of General Education information will improve Description: Including pain rating scale, medication(s)/side effects and non-pharmacologic comfort measures 09/08/2020 1154 by Cristela Blue, RN Outcome: Progressing 09/08/2020 1153 by Cristela Blue, RN Outcome: Progressing   Problem: Health Behavior/Discharge Planning: Goal: Ability to manage health-related needs will improve 09/08/2020 1154 by Cristela Blue, RN Outcome: Progressing 09/08/2020 1153 by Cristela Blue, RN Outcome: Progressing   Problem: Clinical Measurements: Goal: Ability to maintain clinical measurements within normal limits will improve 09/08/2020 1154 by Cristela Blue, RN Outcome: Progressing 09/08/2020 1153 by Cristela Blue, RN Outcome: Progressing Goal: Will remain free from infection 09/08/2020 1154 by Cristela Blue, RN Outcome: Progressing 09/08/2020 1153 by Cristela Blue, RN Outcome: Progressing Goal: Diagnostic test results will improve 09/08/2020 1154 by Cristela Blue, RN Outcome: Progressing 09/08/2020 1153 by Cristela Blue, RN Outcome: Progressing Goal: Respiratory complications will improve 09/08/2020 1154 by Cristela Blue, RN Outcome: Progressing 09/08/2020 1153 by Cristela Blue, RN Outcome: Progressing Goal: Cardiovascular complication will be avoided 09/08/2020 1154 by Cristela Blue, RN Outcome: Progressing 09/08/2020 1153 by Cristela Blue, RN Outcome: Progressing   Problem: Activity: Goal: Risk for activity intolerance will decrease 09/08/2020 1154 by Cristela Blue, RN Outcome: Progressing 09/08/2020 1153 by Cristela Blue, RN Outcome: Progressing   Problem: Nutrition: Goal: Adequate nutrition will be maintained 09/08/2020 1154 by Cristela Blue, RN Outcome: Progressing 09/08/2020 1153 by Cristela Blue, RN Outcome: Progressing   Problem: Coping: Goal: Level of anxiety will decrease 09/08/2020 1154 by Cristela Blue, RN Outcome:  Progressing 09/08/2020 1153 by Cristela Blue, RN Outcome: Progressing   Problem: Elimination: Goal: Will not experience complications related to bowel motility 09/08/2020 1154 by Cristela Blue, RN Outcome: Progressing 09/08/2020 1153 by Cristela Blue, RN Outcome: Progressing Goal: Will not experience complications related to urinary retention 09/08/2020 1154 by Cristela Blue, RN Outcome: Progressing 09/08/2020 1153 by Cristela Blue, RN Outcome: Progressing   Problem: Pain Managment: Goal: General experience of comfort will improve 09/08/2020 1154 by Cristela Blue, RN Outcome: Progressing 09/08/2020 1153 by Cristela Blue, RN Outcome: Progressing   Problem: Safety: Goal: Ability to remain free from injury will improve 09/08/2020 1154 by Cristela Blue, RN Outcome: Progressing 09/08/2020 1153 by Cristela Blue, RN Outcome: Progressing   Problem: Skin Integrity: Goal: Risk for impaired skin integrity will decrease 09/08/2020 1154 by Cristela Blue, RN Outcome: Progressing 09/08/2020 1153 by Cristela Blue, RN Outcome: Progressing   Problem: Education: Goal: Knowledge of disease or condition will improve 09/08/2020 1154 by Cristela Blue, RN Outcome: Progressing 09/08/2020 1153 by Cristela Blue, RN Outcome: Progressing Goal: Understanding of medication regimen will improve 09/08/2020 1154 by Cristela Blue, RN Outcome: Progressing 09/08/2020 1153 by Cristela Blue, RN Outcome: Progressing Goal: Individualized Educational Video(s) 09/08/2020 1154 by Cristela Blue, RN Outcome: Progressing 09/08/2020 1153 by Cristela Blue, RN Outcome: Progressing   Problem: Activity: Goal: Ability to tolerate increased activity will improve 09/08/2020 1154 by Cristela Blue, RN Outcome: Progressing 09/08/2020 1153 by Cristela Blue, RN Outcome: Progressing   Problem: Cardiac: Goal: Ability to achieve and maintain adequate cardiopulmonary perfusion will improve 09/08/2020 1154 by Cristela Blue, RN Outcome: Progressing 09/08/2020 1153 by Cristela Blue, RN Outcome: Progressing   Problem: Health Behavior/Discharge Planning: Goal: Ability to safely manage health-related needs after discharge will improve 09/08/2020 1154 by Cristela Blue, RN Outcome: Progressing 09/08/2020 1153 by Cristela Blue, RN Outcome: Progressing

## 2020-09-08 NOTE — Progress Notes (Signed)
PROGRESS NOTE    MARYKAY MCCLEOD   LOV:[id number]  DOB: 1956/05/13  PCP: Pcp, No    DOA: 09/06/2020 LOS: 2   Brief Narrative   EVELEEN MCNEAR is a 64 y.o. 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  She was found to be in A-fib with RVR, rates up to 150-160's.  Also hypotensive 83/68 which responded to IV fluids.  Evaluation in the ED revealed multifocal pneumonia.  Started on broad spectrum antibiotics and admitted to hospitalist service.    Regarding maxillary cancer, this is a recent diagnosis in early Sept after pt presented with ongoing progressive facial pain.  She is followed by Dr. Tasia Catchings and currently undergoing chemo and radiation therapy.  Last cycle of chemotherapy was Monday 11/1.  She was to receive radiation on 11/5, but sent to ED as outlined above.     Assessment & Plan   Principal Problem:   Multifocal pneumonia Active Problems:   Hypertension   COPD (chronic obstructive pulmonary disease) (HCC)   Tobacco abuse   TIA (transient ischemic attack)   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HCC)   Atrial fibrillation with RVR (HCC)   Elevated troponin   Severe sepsis (HCC)   Severe sepsis - present on admission, due to PNA.  Severe sepsis evidenced by tachycardia, tachypnea, hypotension reflecting organ dysfunction, in setting on PNA.  Treated per protocol in the ED with IV fluid resuscitation and IV antibiotics.  Multifocal Pneumonia - present on admission as outlined above.  Given frequent healthcare visits for cancer recently, covering for healthcare-associated PNA.   CT chest showed infiltrates worse in posterior RUL, but also in RML and lingula on left.  No cavitary component seen on CT as  was in xray report.  Strep pneumo antigen negative. --Initially covered broad spectrum with Vancomycin, Cefepime, Zithromax --D/C Vancomycin (11/7) --Change Cefepime-->Rocephin, Zithromax--> Doxycycline (11/7) --F/u urinary antigens for Legionella --F/u blood and sputum cultures --Mucinex --PRN albuterol  A-fib with RVR - POA with HR's 150-160's.  No chest pain.  RVR being driven by infection.  TSH normal. 11/6: Rate improved, intermittently spikes into 120's per charted vitals 11/7: rate controlled, off Cardizem drip, HR in 60's --Continue Eliquis and metoprolol --Monitor on telemetry  Elevated troponin - POA, mild 27>>25, likely demand ischemia in setting of hypotension and A-fib RVR.  No chest pain.  Monitor.  Electrolyte derangements - presented with hypokalemia (K 2.8) and hypomagnesemia (Mg 1.4), which were replaced.  Monitor BMP, Mg.  Replace further as needed.  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  Follows with Dr. Tasia Catchings and currently on chemotherapy and radiation.  Last chemo Monday 11/1.  Was to have radiation day of admission, but found hypotensive and in rapid A-fib, now admitted, treatment was not able to be done. --Continue Decadron --Diet as tolerated, dysphagia 3 is ordered - can change per patient preference  Essential hypertension - hypotensive on admission. --Continue metoprolol for A-fib --Hold home hydralazine and lisinopril until BP improves  COPD - stable, not acutely exacerbated.  Bronchodilators as above.  Hx of TIA - on Eliquis for A-fib  Tobacco abuse - nicotine patch.  Counseled regarding cessation.    DVT prophylaxis:  apixaban (ELIQUIS) tablet 5 mg   Diet:  Diet Orders (From  admission, onward)    Start     Ordered   09/08/20 0916  DIET DYS 3 Room service appropriate? Yes; Fluid consistency: Thin  Diet effective now       Comments: SEND PLASTIC SILVERWARE ONLY  Question Answer Comment  Room service appropriate? Yes     Fluid consistency: Thin      09/08/20 0916            Code Status: Full Code    Subjective 09/08/20    Pt seen at bedside this AM.  Reports feeling better.  Says tolerating dysphagia 3 diet for the  Most part.  Denies fever/chills.  No acute events reported.   Disposition Plan & Communication   Status is: Inpatient  Remains inpatient appropriate because:IV treatments appropriate due to intensity of illness or inability to take PO.  Continuing IV antibiotics as above.   Dispo: The patient is from: Home              Anticipated d/c is to: Home              Anticipated d/c date is: 1-2 days              Patient currently is not medically stable to d/c.    Family Communication: none at bedside during encounter.  Will attempt to call roommate this afternoon.   Contact person is patient's roommate, [name] [phone]    Consults, Procedures, Significant Events   Consultants:   None  Procedures:   None  Antimicrobials:  Anti-infectives (From admission, onward)   Start     Dose/Rate Route Frequency Ordered Stop   09/07/20 1600  azithromycin (ZITHROMAX) 500 mg in sodium chloride 0.9 % 250 mL IVPB        500 mg 250 mL/hr over 60 Minutes Intravenous Every 24 hours 09/06/20 1702     09/07/20 0800  vancomycin (VANCOREADY) IVPB 500 mg/100 mL        500 mg 100 mL/hr over 60 Minutes Intravenous Every 12 hours 09/06/20 1717     09/06/20 2200  ceFEPIme (MAXIPIME) 2 g in sodium chloride 0.9 % 100 mL IVPB        2 g 200 mL/hr over 30 Minutes Intravenous Every 12 hours 09/06/20 1717     09/06/20 1630  vancomycin (VANCOCIN) IVPB 1000 mg/200 mL premix        1,000 mg 200 mL/hr over 60 Minutes Intravenous  Once 09/06/20 1626 09/06/20 1940   09/06/20 1630  ceFEPIme (MAXIPIME) 2 g in sodium chloride 0.9 % 100 mL IVPB        2 g 200 mL/hr over 30 Minutes Intravenous  Once 09/06/20 1626 09/06/20 1709   09/06/20 1630  azithromycin (ZITHROMAX) 500 mg in sodium chloride 0.9 % 250 mL  IVPB        500 mg 250 mL/hr over 60 Minutes Intravenous  Once 09/06/20 1626 09/06/20 1752         Objective   Vitals:   09/08/20 0407 09/08/20 0700 09/08/20 0800 09/08/20 1100  BP: 106/66 110/68 121/75 124/78  Pulse: 68 69    Resp: 14 10 10 12   Temp: 98.6 F (37 C) 97.7 F (36.5 C)  97.9 F (36.6 C)  TempSrc: Oral Oral  Oral  SpO2: 97% 97%  99%  Weight:      Height:        Intake/Output Summary (Last 24 hours) at 09/08/2020 1351 Last data filed at 09/08/2020 0253 Gross per 24  hour  Intake 986.8 ml  Output --  Net 986.8 ml   Filed Weights   09/06/20 1246 09/07/20 1658  Weight: 51.4 kg 61.2 kg    Physical Exam:  General exam: awake, alert, no acute distress HEENT: right facial swelling appears improved today, muffled voice  Respiratory system: CTAB, no wheezes, normal respiratory effort, on room air. Cardiovascular system: normal S1/S2, irregular rhythm, regular rate, no pedal edema.   Central nervous system: A&O x4, grossly non-focal exam Psychiatry: normal mood, congruent affect, judgement and insight appear normal  Labs   Data Reviewed: I have personally reviewed following labs and imaging studies  CBC: Recent Labs  Lab 09/02/20 0815 09/02/20 0815 09/04/20 1249 09/06/20 1141 09/06/20 1243 09/07/20 0802 09/08/20 0343  WBC 10.8*   < > 7.5 6.7 8.2 4.5 5.0  NEUTROABS 9.1*  --  5.9 5.3 6.4  --   --   HGB 11.4*   < > 9.8* 10.0* 10.0* 10.7* 11.1*  HCT 33.0*   < > 29.5* 29.7* 30.7* 31.8* 32.3*  MCV 97.1   < > 100.0 98.7 100.0 98.1 97.3  PLT 448*   < > 367 332 338 353 363   < > = values in this interval not displayed.   Basic Metabolic Panel: Recent Labs  Lab 09/04/20 1249 09/06/20 1141 09/06/20 1243 09/07/20 0802 09/07/20 1430 09/08/20 0343  NA 136 136 140 131*  --  132*  K 3.3* 2.7* 2.8* 3.3*  --  3.6  CL 99 97* 99 94*  --  96*  CO2 27 27 26 24   --  27  GLUCOSE 163* 133* 107* 117*  --  112*  BUN 18 13 12 8   --  10  CREATININE 0.48 0.49 0.39*  0.45  --  0.52  CALCIUM 8.3* 7.9* 7.6* 7.7*  --  7.9*  MG 1.7 1.4* 1.4*  --  1.6* 1.7   GFR: Estimated Creatinine Clearance: 65.6 mL/min (by C-G formula based on SCr of 0.52 mg/dL). Liver Function Tests: Recent Labs  Lab 09/02/20 0815 09/04/20 1249 09/06/20 1141 09/06/20 1243  AST 20 28 20 19   ALT 17 17 17 16   ALKPHOS 92 90 86 85  BILITOT 0.7 0.5 0.5 0.7  PROT 7.0 5.8* 5.7* 5.6*  ALBUMIN 3.1* 2.7* 2.6* 2.6*   No results for input(s): LIPASE, AMYLASE in the last 168 hours. No results for input(s): AMMONIA in the last 168 hours. Coagulation Profile: No results for input(s): INR, PROTIME in the last 168 hours. Cardiac Enzymes: No results for input(s): CKTOTAL, CKMB, CKMBINDEX, TROPONINI in the last 168 hours. BNP (last 3 results) No results for input(s): PROBNP in the last 8760 hours. HbA1C: Recent Labs    09/07/20 0802  HGBA1C 5.4   CBG: No results for input(s): GLUCAP in the last 168 hours. Lipid Profile: No results for input(s): CHOL, HDL, LDLCALC, TRIG, CHOLHDL, LDLDIRECT in the last 72 hours. Thyroid Function Tests: Recent Labs    09/07/20 0802  TSH 3.199   Anemia Panel: No results for input(s): VITAMINB12, FOLATE, FERRITIN, TIBC, IRON, RETICCTPCT in the last 72 hours. Sepsis Labs: Recent Labs  Lab 09/06/20 1243  PROCALCITON <0.10  LATICACIDVEN 1.9    Recent Results (from the past 240 hour(s))  Blood culture (routine x 2)     Status: None (Preliminary result)   Collection Time: 09/06/20 12:53 PM   Specimen: BLOOD  Result Value Ref Range Status   Specimen Description BLOOD RAC  Final   Special Requests  Final    BOTTLES DRAWN AEROBIC AND ANAEROBIC Blood Culture adequate volume   Culture   Final    NO GROWTH < 24 HOURS Performed at [facility], [address] [place], [place] [address]    Report Status PENDING  Incomplete  Respiratory Panel by RT PCR (Flu A&B, Covid) - Nasopharyngeal Swab     Status: None   Collection Time: 09/06/20   1:01 PM   Specimen: Nasopharyngeal Swab  Result Value Ref Range Status   SARS Coronavirus 2 by RT PCR NEGATIVE NEGATIVE Final    Comment: (NOTE) SARS-CoV-2 target nucleic acids are NOT DETECTED.  The SARS-CoV-2 RNA is generally detectable in upper respiratoy specimens during the acute phase of infection. The lowest concentration of SARS-CoV-2 viral copies this assay can detect is 131 copies/mL. A negative result does not preclude SARS-Cov-2 infection and should not be used as the sole basis for treatment or other patient management decisions. A negative result may occur with  improper specimen collection/handling, submission of specimen other than nasopharyngeal swab, presence of viral mutation(s) within the areas targeted by this assay, and inadequate number of viral copies (<131 copies/mL). A negative result must be combined with clinical observations, patient history, and epidemiological information. The expected result is Negative.  Fact Sheet for Patients:  PinkCheek.be  Fact Sheet for Healthcare Providers:  GravelBags.it  This test is no t yet approved or cleared by the [place] FDA and  has been authorized for detection and/or diagnosis of SARS-CoV-2 by FDA under an Emergency Use Authorization (EUA). This EUA will remain  in effect (meaning this test can be used) for the duration of the COVID-19 declaration under Section 564(b)(1) of the Act, 21 U.S.C. section 360bbb-3(b)(1), unless the authorization is terminated or revoked sooner.     Influenza A by PCR NEGATIVE NEGATIVE Final   Influenza B by PCR NEGATIVE NEGATIVE Final    Comment: (NOTE) The Xpert Xpress SARS-CoV-2/FLU/RSV assay is intended as an aid in  the diagnosis of influenza from Nasopharyngeal swab specimens and  should not be used as a sole basis for treatment. Nasal washings and  aspirates are unacceptable for Xpert Xpress SARS-CoV-2/FLU/RSV    testing.  Fact Sheet for Patients: PinkCheek.be  Fact Sheet for Healthcare Providers: GravelBags.it  This test is not yet approved or cleared by the [place] FDA and  has been authorized for detection and/or diagnosis of SARS-CoV-2 by  FDA under an Emergency Use Authorization (EUA). This EUA will remain  in effect (meaning this test can be used) for the duration of the  Covid-19 declaration under Section 564(b)(1) of the Act, 21  U.S.C. section 360bbb-3(b)(1), unless the authorization is  terminated or revoked. Performed at [facility], [address] [place], [place] [address]   Blood culture (routine x 2)     Status: None (Preliminary result)   Collection Time: 09/06/20  1:15 PM   Specimen: BLOOD  Result Value Ref Range Status   Specimen Description BLOOD LA  Final   Special Requests   Final    BOTTLES DRAWN AEROBIC AND ANAEROBIC Blood Culture adequate volume   Culture   Final    NO GROWTH < 24 HOURS Performed at [facility], [address] [place], [place] [address]    Report Status PENDING  Incomplete  MRSA PCR Screening     Status: Abnormal   Collection Time: 09/06/20  5:10 PM   Specimen: Nasopharyngeal  Result Value Ref Range Status  MRSA by PCR POSITIVE (A) NEGATIVE Final    Comment:        The GeneXpert MRSA Assay (FDA approved for NASAL specimens only), is one component of a comprehensive MRSA colonization surveillance program. It is not intended to diagnose MRSA infection nor to guide or monitor treatment for MRSA infections. RESULT CALLED TO, READ BACK BY AND VERIFIED WITH: LINDSAY BLACK ON 09/06/20 AT 2130 QSD Performed at [facility], [address] [place], [place] [address]   Culture, sputum-assessment     Status: None   Collection Time: 09/06/20  7:27 PM   Specimen: Sputum  Result Value Ref Range Status   Specimen Description SPUTUM  Final   Special  Requests NONE  Final   Sputum evaluation   Final    THIS SPECIMEN IS ACCEPTABLE FOR SPUTUM CULTURE Performed at [facility], [address] [place], [place] [address]    Report Status 09/06/2020 FINAL  Final  Culture, respiratory     Status: None   Collection Time: 09/06/20  7:27 PM   Specimen: SPU  Result Value Ref Range Status   Specimen Description   Final    SPUTUM Performed at [facility], [address], [place], [place] [address]    Special Requests   Final    NONE Reflexed from [id number] Performed at [facility], [address] [place], [place] [address]    Gram Stain   Final    ABUNDANT WBC PRESENT, PREDOMINANTLY PMN MODERATE GRAM POSITIVE COCCI FEW GRAM POSITIVE RODS RARE GRAM NEGATIVE RODS    Culture   Final    Consistent with normal respiratory flora. No Pseudomonas species isolated Performed at [facility] [address], [place], [place] [address]    Report Status 09/08/2020 FINAL  Final      Imaging Studies   CT Chest W Contrast  Result Date: 09/06/2020 CLINICAL DATA:  Follow-up abnormal chest x-ray EXAM: CT CHEST WITH CONTRAST TECHNIQUE: Multidetector CT imaging of the chest was performed during intravenous contrast administration. CONTRAST:  2mL OMNIPAQUE IOHEXOL 300 MG/ML  SOLN COMPARISON:  Chest x-ray from earlier in the same day and CT from 08/08/2020. FINDINGS: Cardiovascular: Thoracic aorta demonstrates atherosclerotic calcifications. No dissection is noted. No aneurysmal dilatation is seen. The pulmonary artery as visualized shows no pulmonary embolism although not timed for true embolus evaluation. No cardiac enlargement is seen. No coronary calcifications are noted. Left-sided chest wall port is noted in satisfactory position. Mediastinum/Nodes: Thoracic inlet is within normal limits. Scattered right hilar and subcarinal lymph nodes are seen. The largest of the hilar lymph nodes measures approximately 13 mm in short  axis. Subcarinal node measures 10 mm in short axis. The esophagus as visualized is within normal limits. Lungs/Pleura: Lungs are well aerated bilaterally. The previously seen chest x-ray abnormality lies in the posterior aspect of the right upper lobe and is pleural base consistent with acute pneumonia. Patchy similar infiltrates are noted in the right middle lobe as well as the lingula on the left. The appearance of cavitary component seen on prior plain film is not borne out on the CT and likely projectional in nature. No sizable parenchymal nodule is seen. No effusion or pneumothorax is noted. Upper Abdomen: Visualized upper abdomen shows mild fatty infiltration of the liver. Musculoskeletal: Degenerative changes of the thoracic spine are noted. No acute rib abnormality is noted. No compression deformity is seen. IMPRESSION: Patchy multifocal infiltrate worst in the right upper lobe posteriorly and laterally  consistent with that seen on recent plain film examination. This is new from the prior CT consistent with multifocal pneumonia. Followup PA and lateral chest X-ray is recommended in 3-4 weeks following trial of antibiotic therapy to ensure resolution. Fatty liver. No other focal abnormality is noted. Aortic Atherosclerosis (ICD10-I70.0). Electronically Signed   By: Inez Catalina M.D.   On: 09/06/2020 16:12     Medications   Scheduled Meds: . apixaban  5 mg Oral BID  . chlorhexidine  10 mL Mouth/Throat BID  . Chlorhexidine Gluconate Cloth  6 each Topical Daily  . dexamethasone  4 mg Oral Daily  . docusate sodium  100 mg Oral Daily  . feeding supplement  1 Container Oral TID BM  . fentaNYL  1 patch Transdermal Q72H  . gabapentin  300 mg Oral TID  . metoprolol tartrate  50 mg Oral BID  . multivitamin with minerals  1 tablet Oral Daily  . nicotine  21 mg Transdermal Daily  . potassium chloride  40 mEq Oral Once  . potassium chloride  40 mEq Oral Once  . sodium chloride flush  10-40 mL  Intracatheter Q12H   Continuous Infusions: . sodium chloride    . 0.9 % NaCl with KCl 40 mEq / L 75 mL/hr at 09/08/20 0936  . azithromycin Stopped (09/07/20 1938)  . ceFEPime (MAXIPIME) IV 2 g (09/08/20 0927)  . diltiazem (CARDIZEM) infusion Stopped (09/08/20 0728)  . vancomycin 500 mg (09/08/20 0814)       LOS: 2 days    Time spent: 25 minutes with > 50% spent in coordination of care and direct patient contact.    Ezekiel Slocumb, DO [facility]  09/08/2020, 1:51 PM    If 7PM-7AM, please contact night-coverage. How to contact the [facility] Attending or Consulting provider [place] or covering provider during after hours [place], for this patient?    1. Check the care team in [facility] and look for a) attending/consulting TRH provider listed and b) the [facility] team listed 2. Log into [email or web address] and use [place]'s universal password to access. If you do not have the password, please contact the hospital operator. 3. Locate the [facility] provider you are looking for under [facility] and page to a number that you can be directly reached. 4. If you still have difficulty reaching the provider, please page the [facility] (Director on Call) for the Hospitalists listed on amion for assistance.

## 2020-09-08 NOTE — TOC Initial Note (Signed)
Transition of Care [facility]) - Initial/Assessment Note    Patient Details  Name: Shannon Schroeder MRN: [id number] Date of Birth: 1956/02/14  [facility]) CM/SW Contact:    Meriel Flavors, LCSW Phone Number: 09/08/2020, 3:03 PM  Clinical Narrative:                 Patient from home, has support of friends and family, no issues with transportation. Patient currently does not have insurance coverage. Patient is connected with hospital financial counselor and is in process of applying for Medicare. Patient will need any available assistance to pay for medication at discharge         Patient Goals and CMS Choice        Expected Discharge Plan and Services                                                Prior Living Arrangements/Services                       Activities of Daily Living Home Assistive Devices/Equipment: None ADL Screening (condition at time of admission) Patient's cognitive ability adequate to safely complete daily activities?: Yes Is the patient deaf or have difficulty hearing?: No Does the patient have difficulty seeing, even when wearing glasses/contacts?: No Does the patient have difficulty concentrating, remembering, or making decisions?: No Patient able to express need for assistance with ADLs?: Yes Does the patient have difficulty dressing or bathing?: No Independently performs ADLs?: Yes (appropriate for developmental age) Does the patient have difficulty walking or climbing stairs?: No Weakness of Legs: None Weakness of Arms/Hands: None  Permission Sought/Granted                  Emotional Assessment              Admission diagnosis:  Atrial fibrillation with RVR ([place]) [I48.91] Multifocal pneumonia [J18.9] Pneumonia of right lung due to infectious organism, unspecified part of lung [J18.9] Patient Active Problem List   Diagnosis Date Noted  . Severe sepsis ([place]) 09/06/2020  . Multifocal pneumonia  09/06/2020  . Drug-induced constipation 08/26/2020  . Hypotension 08/12/2020  . Encounter for antineoplastic chemotherapy 08/08/2020  . Atrial fibrillation with RVR ([place]) 08/08/2020  . Elevated troponin 08/08/2020  . Leukocytosis 08/08/2020  . Hypokalemia 08/08/2020  . SIRS (systemic inflammatory response syndrome) ([place]) 08/08/2020  . Goals of care, counseling/discussion 08/06/2020  . Head and neck cancer ([employer]) 07/29/2020  . 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[place]) 07/29/2020  . Weight loss 07/29/2020  . Neoplasm related pain 07/29/2020  . Abscess of upper gum 09/12/2019  . History of atrial fibrillation 03/22/2019  . Bipolar affective disorder, current episode mixed ([place]) 06/29/2018  . TIA (transient ischemic attack) 06/15/2018  . Tobacco abuse 06/02/2018  . COPD (chronic obstructive pulmonary disease) ([place]) 04/21/2018  . URI (upper respiratory infection) 03/31/2018  . Hypertension 03/31/2018  . Tachycardia 02/02/2018  . Rash of body 02/02/2018  . Hyponatremia 02/02/2018   PCP:  Pcp, No Pharmacy:   [place], [place] [address] [place] [address] Phone: [phone] Fax: [phone]     Social Determinants of Health (SDOH) Interventions    Readmission Risk Interventions Readmission Risk Prevention Plan 09/08/2020  Transportation Screening Complete  PCP or Specialist Appt within 3-5 Days  Complete  HRI or Home Care Consult Complete  Social Work Consult for [place] Planning/Counseling Complete  Palliative Care Screening Not Applicable  Medication Review Press photographer) Complete  Some recent data might be hidden

## 2020-09-09 ENCOUNTER — Ambulatory Visit: Payer: Medicaid Other

## 2020-09-09 ENCOUNTER — Inpatient Hospital Stay: Payer: Medicaid Other

## 2020-09-09 ENCOUNTER — Inpatient Hospital Stay: Payer: Medicaid Other | Admitting: Oncology

## 2020-09-09 DIAGNOSIS — E876 Hypokalemia: Secondary | ICD-10-CM

## 2020-09-09 DIAGNOSIS — R1310 Dysphagia, unspecified: Secondary | ICD-10-CM

## 2020-09-09 DIAGNOSIS — I4891 Unspecified atrial fibrillation: Secondary | ICD-10-CM

## 2020-09-09 DIAGNOSIS — C310 Malignant neoplasm of maxillary sinus: Secondary | ICD-10-CM

## 2020-09-09 DIAGNOSIS — J189 Pneumonia, unspecified organism: Secondary | ICD-10-CM

## 2020-09-09 LAB — CBC
HCT: 26.4 % — ABNORMAL LOW (ref 36.0–46.0)
HCT: 29.5 % — ABNORMAL LOW (ref 36.0–46.0)
Hemoglobin: 8.9 g/dL — ABNORMAL LOW (ref 12.0–15.0)
Hemoglobin: 10 g/dL — ABNORMAL LOW (ref 12.0–15.0)
MCH: 33 pg (ref 26.0–34.0)
MCH: 33.7 pg (ref 26.0–34.0)
MCHC: 33.7 g/dL (ref 30.0–36.0)
MCHC: 33.9 g/dL (ref 30.0–36.0)
MCV: 97.8 fL (ref 80.0–100.0)
MCV: 99.3 fL (ref 80.0–100.0)
Platelets: 289 10*3/uL (ref 150–400)
Platelets: 316 10*3/uL (ref 150–400)
RBC: 2.7 MIL/uL — ABNORMAL LOW (ref 3.87–5.11)
RBC: 2.97 MIL/uL — ABNORMAL LOW (ref 3.87–5.11)
RDW: 13.2 % (ref 11.5–15.5)
RDW: 13.2 % (ref 11.5–15.5)
WBC: 4 10*3/uL (ref 4.0–10.5)
WBC: 5.1 10*3/uL (ref 4.0–10.5)
nRBC: 0 % (ref 0.0–0.2)
nRBC: 0 % (ref 0.0–0.2)

## 2020-09-09 LAB — BASIC METABOLIC PANEL
Anion gap: 5 (ref 5–15)
Anion gap: 10 (ref 5–15)
BUN: 14 mg/dL (ref 8–23)
BUN: 12 mg/dL (ref 8–23)
CO2: 28 mmol/L (ref 22–32)
CO2: 26 mmol/L (ref 22–32)
Calcium: 7.6 mg/dL — ABNORMAL LOW (ref 8.9–10.3)
Calcium: 7.7 mg/dL — ABNORMAL LOW (ref 8.9–10.3)
Chloride: 100 mmol/L (ref 98–111)
Chloride: 97 mmol/L — ABNORMAL LOW (ref 98–111)
Creatinine, Ser: 0.3 mg/dL — ABNORMAL LOW (ref 0.44–1.00)
Creatinine, Ser: 0.44 mg/dL (ref 0.44–1.00)
GFR, Estimated: >60 mL/min (ref >60)
GFR, Estimated: >60 mL/min (ref >60)
Glucose, Bld: 85 mg/dL (ref 70–99)
Glucose, Bld: 94 mg/dL (ref 70–99)
Potassium: 3.9 mmol/L (ref 3.5–5.1)
Potassium: 3.2 mmol/L — ABNORMAL LOW (ref 3.5–5.1)
Sodium: 133 mmol/L — ABNORMAL LOW (ref 135–145)
Sodium: 133 mmol/L — ABNORMAL LOW (ref 135–145)

## 2020-09-09 LAB — IRON AND TIBC
Iron: 46 ug/dL (ref 28–170)
Saturation Ratios: 22 % (ref 10.4–31.8)
TIBC: 210 ug/dL — ABNORMAL LOW (ref 250–450)
UIBC: 164 ug/dL

## 2020-09-09 LAB — VITAMIN B12: Vitamin B-12: 449 pg/mL (ref 180–914)

## 2020-09-09 LAB — RETICULOCYTES
Immature Retic Fract: 34.2 % — ABNORMAL HIGH (ref 2.3–15.9)
RBC.: 2.84 MIL/uL — ABNORMAL LOW (ref 3.87–5.11)
Retic Count, Absolute: 44.6 10*3/uL (ref 19.0–186.0)
Retic Ct Pct: 1.6 % (ref 0.4–3.1)

## 2020-09-09 LAB — HEMOGLOBIN AND HEMATOCRIT, BLOOD
HCT: 29.2 % — ABNORMAL LOW (ref 36.0–46.0)
Hemoglobin: 9.8 g/dL — ABNORMAL LOW (ref 12.0–15.0)

## 2020-09-09 LAB — MAGNESIUM
Magnesium: 1.7 mg/dL (ref 1.7–2.4)
Magnesium: 1.8 mg/dL (ref 1.7–2.4)

## 2020-09-09 LAB — FOLATE: Folate: 11.2 ng/mL (ref >5.9)

## 2020-09-09 LAB — FERRITIN: Ferritin: 136 ng/mL (ref 11–307)

## 2020-09-09 LAB — LEGIONELLA PNEUMOPHILA SEROGP 1 UR AG: L. pneumophila Serogp 1 Ur Ag: NEGATIVE

## 2020-09-09 MED ORDER — AMIODARONE HCL IN DEXTROSE 360-4.14 MG/200ML-% IV SOLN: 30.0000 mg/h | INTRAVENOUS | Status: DC

## 2020-09-09 MED ORDER — AMIODARONE HCL IN DEXTROSE 360-4.14 MG/200ML-% IV SOLN
60.0000 mg/h | INTRAVENOUS | Status: DC
  Administered 2020-09-09: 60 mg/h via INTRAVENOUS
  Filled 2020-09-09: qty 200

## 2020-09-09 MED ORDER — POTASSIUM CHLORIDE 10 MEQ/100ML IV SOLN
10.0000 meq | INTRAVENOUS | Status: DC
  Filled 2020-09-09 (×2): qty 100

## 2020-09-09 MED ORDER — MAGNESIUM SULFATE 2 GM/50ML IV SOLN
2.0000 g | Freq: Once | INTRAVENOUS | Status: AC
  Administered 2020-09-10: 2 g via INTRAVENOUS
  Filled 2020-09-09: qty 50

## 2020-09-09 MED ORDER — POTASSIUM CHLORIDE CRYS ER 20 MEQ PO TBCR
40.0000 meq | EXTENDED_RELEASE_TABLET | Freq: Once | ORAL | Status: DC
  Filled 2020-09-09: qty 2

## 2020-09-09 MED ORDER — AMIODARONE HCL IN DEXTROSE 360-4.14 MG/200ML-% IV SOLN
INTRAVENOUS | Status: AC
  Filled 2020-09-09: qty 200

## 2020-09-09 MED ORDER — MUPIROCIN 2 % EX OINT
1.0000 "application " | TOPICAL_OINTMENT | Freq: Two times a day (BID) | CUTANEOUS | Status: DC
  Administered 2020-09-09 – 2020-09-10 (×2): 1 via NASAL
  Filled 2020-09-09: qty 22

## 2020-09-09 MED ORDER — AMIODARONE LOAD VIA INFUSION
150.0000 mg | Freq: Once | INTRAVENOUS | Status: AC
  Administered 2020-09-09: 150 mg via INTRAVENOUS
  Filled 2020-09-09: qty 83.34

## 2020-09-09 MED ORDER — MAGNESIUM SULFATE 2 GM/50ML IV SOLN
2.0000 g | Freq: Once | INTRAVENOUS | Status: AC
  Administered 2020-09-09: 2 g via INTRAVENOUS
  Filled 2020-09-09: qty 50

## 2020-09-09 MED ORDER — SODIUM CHLORIDE 0.9 % IV BOLUS
500.0000 mL | Freq: Once | INTRAVENOUS | Status: AC
  Administered 2020-09-09: 500 mL via INTRAVENOUS

## 2020-09-09 NOTE — Consult Note (Signed)
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 Fib Patient was found to be persistent hypotension despite decreasing blood pressure medications. SHe was found to be in atrial fibrillation with rapid ventricular response with heart rate 1 50-1 60s. patient was found to have a WBC 8.2, lactic acid 1.9, potassium 2.8, magnesium to 1.4, x-ray showed multifocal infiltration.  CT chest showed patchy multifocal infiltrates worse in the right upper lobe posteriorly and laterally consistent with multifocal pneumonia.  Patient was admitted for pneumonia treatments. Hematology oncology was consulted as patient has questions about her ongoing chemotherapy and radiation. Patient reports feeling well today. Patient has been treated with IV vancomycin, cefepime and azithromycin.  There is plan to switch her to oral antibiotics. Cough is better per patient. She continues to have dysphagia.  Review of Systems  Constitutional: Positive for appetite change, fatigue and unexpected weight change. Negative for chills and fever.  HENT:   Negative for hearing loss and voice change.   Eyes: Negative for eye problems.  Respiratory: Positive for cough. Negative for chest tightness.   Cardiovascular: Negative for chest pain.  Gastrointestinal: Negative for abdominal distention,  abdominal pain and blood in stool.  Endocrine: Negative for hot flashes.  Genitourinary: Negative for difficulty urinating and frequency.   Musculoskeletal: Negative for arthralgias.  Skin: Negative for itching and rash.  Neurological: Negative for extremity weakness.  Hematological: Negative for adenopathy.  Psychiatric/Behavioral: Negative for confusion.    Allergies  Allergen Reactions  . Aspirin Swelling and Other (See Comments)  . Belladonna Alkaloids Rash  . Fluoxetine Hcl Other (See Comments) and Rash  . Naproxen Swelling, Rash and Other (See Comments)  . Phenobarbital Rash and Other (See Comments)  . Prozac [Fluoxetine Hcl] Rash    Patient Active Problem List   Diagnosis Date Noted  . Severe sepsis ([place]) 09/06/2020  . Multifocal pneumonia 09/06/2020  . Drug-induced constipation 08/26/2020  . Hypotension 08/12/2020  . Encounter for antineoplastic chemotherapy 08/08/2020  . Atrial fibrillation with RVR ([place]) 08/08/2020  . Elevated troponin 08/08/2020  . Leukocytosis 08/08/2020  . Hypokalemia 08/08/2020  . SIRS (systemic inflammatory response syndrome) ([place]) 08/08/2020  . Goals of care, counseling/discussion 08/06/2020  . Head and neck cancer ([place]) 07/29/2020  . 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[place]) 07/29/2020  . Weight loss 07/29/2020  . Neoplasm related pain 07/29/2020  . Abscess of upper gum 09/12/2019  . History of atrial fibrillation 03/22/2019  . Bipolar affective disorder, current episode mixed ([place]) 06/29/2018  . TIA (transient ischemic attack) 06/15/2018  . Tobacco abuse 06/02/2018  . COPD (chronic obstructive pulmonary disease) ([place]) 04/21/2018  . URI (upper respiratory infection) 03/31/2018  . Hypertension 03/31/2018  . Tachycardia 02/02/2018  . Rash of body 02/02/2018  . Hyponatremia 02/02/2018     Past Medical History:  Diagnosis Date  . 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   Marland Kitchen Anxiety   .  Arthritis     . Bipolar disorder ([place])   . COPD (chronic obstructive pulmonary disease) ([place])    NO INHALERS  . Depression   . Dysrhythmia    H/O V TACH  . Head and neck cancer ([place]) 07/29/2020  . Headache    H/O MIGRAINES  . Hypertension   . Seizures ([place])    X1 AFTER FALL     Past Surgical History:  Procedure Laterality Date  . ABDOMINAL HYSTERECTOMY     partial  . BREAST SURGERY     biopsy  . CARDIAC CATHETERIZATION     X 2  . CARPAL TUNNEL RELEASE Right   . CARPECTOMY HAND Right   . CATARACT EXTRACTION W/PHACO Left 03/08/2018   Procedure: CATARACT EXTRACTION PHACO AND INTRAOCULAR LENS PLACEMENT (IOC);  Surgeon: Birder Robson, MD;  Location: [facility] ORS;  Service: Ophthalmology;  Laterality: Left;  [place] 00:55 AP% 13.5 CDE 7.52 Fluid pack lot # [id number]  . CATARACT EXTRACTION W/PHACO Right 04/05/2018   Procedure: CATARACT EXTRACTION PHACO AND INTRAOCULAR LENS PLACEMENT (IOC);  Surgeon: Birder Robson, MD;  Location: [facility] ORS;  Service: Ophthalmology;  Laterality: Right;  [place] 00:36 AP% 15.9 CDE 5.72 Fluid pack lot # [id number]  . CEREBRAL ANEURYSM REPAIR    . CHOLECYSTECTOMY    . COLONOSCOPY WITH PROPOFOL N/A 04/26/2019   Procedure: COLONOSCOPY WITH PROPOFOL;  Surgeon: Jonathon Bellows, MD;  Location: [facility] ENDOSCOPY;  Service: Gastroenterology;  Laterality: N/A;  . PORTACATH PLACEMENT Left 08/05/2020   Procedure: INSERTION PORT-A-CATH;  Surgeon: Herbert Pun, MD;  Location: [facility] ORS;  Service: General;  Laterality: Left;  . TUBAL LIGATION      Social History   Socioeconomic History  . Marital status: Divorced    Spouse name: Not on file  . Number of children: Not on file  . Years of education: 9  . Highest education level: Bachelor's degree (e.g., BA, AB, BS)  Occupational History  . Occupation: farmer  Tobacco Use  . Smoking status: Current Every Day Smoker    Packs/day: 1.00    Years: 50.00    Pack years: 50.00    Types: Cigarettes  . Smokeless tobacco: Never Used   Vaping Use  . Vaping Use: Never used  Substance and Sexual Activity  . Alcohol use: No  . Drug use: No  . Sexual activity: Not Currently  Other Topics Concern  . Not on file  Social History Narrative   Pt lives at [facility].    Social Determinants of Health   Financial Resource Strain:   . Difficulty of Paying Living Expenses: Not on file  Food Insecurity:   . Worried About Charity fundraiser in the Last Year: Not on file  . Ran Out of Food in the Last Year: Not on file  Transportation Needs:   . Lack of Transportation (Medical): Not on file  . Lack of Transportation (Non-Medical): Not on file  Physical Activity:   . Days of Exercise per Week: Not on file  . Minutes of Exercise per Session: Not on file  Stress:   . Feeling of Stress : Not on file  Social Connections:   . Frequency of Communication with Friends and Family: Not on file  . Frequency of Social Gatherings with Friends and Family: Not on file  . Attends Religious Services: Not on file  . Active Member of Clubs or Organizations: Not on file  . Attends Archivist Meetings: Not on file  . Marital Status: Not on  file  Intimate Partner Violence:   . Fear of Current or Ex-Partner: Not on file  . Emotionally Abused: Not on file  . Physically Abused: Not on file  . Sexually Abused: Not on file     Family History  Problem Relation Age of Onset  . Hypertension Mother   . Heart failure Mother   . Hypertension Brother   . Stroke Brother   . Hypertension Son   . Emphysema Maternal Aunt   . Hypertension Paternal Aunt   . Hypertension Paternal Uncle   . Hypertension Maternal Grandmother   . Hypertension Paternal Grandmother      Current Facility-Administered Medications:  .  0.9 %  sodium chloride infusion, , Intravenous, PRN, Nicole Kindred A, DO .  acetaminophen (TYLENOL) tablet 650 mg, 650 mg, Oral, Q6H PRN, Ivor Costa, MD .  albuterol (VENTOLIN HFA) 108 (90 Base) MCG/ACT inhaler 2 puff,  2 puff, Inhalation, Q4H PRN, Ivor Costa, MD .  apixaban Arne Cleveland) tablet 5 mg, 5 mg, Oral, BID, Ivor Costa, MD, 5 mg at 09/08/20 2119 .  cefTRIAXone (ROCEPHIN) 2 g in sodium chloride 0.9 % 100 mL IVPB, 2 g, Intravenous, Q24H, Nicole Kindred A, DO, Stopped at 09/08/20 1512 .  chlorhexidine (PERIDEX) 0.12 % solution 10 mL, 10 mL, Mouth/Throat, BID, Ivor Costa, MD, 10 mL at 09/08/20 0932 .  Chlorhexidine Gluconate Cloth 2 % PADS 6 each, 6 each, Topical, Daily, Ezekiel Slocumb, DO, 6 each at 09/08/20 0943 .  dexamethasone (DECADRON) tablet 4 mg, 4 mg, Oral, Daily, Ivor Costa, MD, 4 mg at 09/08/20 0943 .  dextromethorphan-guaiFENesin (MUCINEX DM) 30-600 MG per 12 hr tablet 1 tablet, 1 tablet, Oral, BID PRN, Ivor Costa, MD .  [COMPLETED] diltiazem (CARDIZEM) 1 mg/mL load via infusion 10 mg, 10 mg, Intravenous, Once, 10 mg at 09/06/20 1334 **AND** diltiazem (CARDIZEM) 125 mg in dextrose 5% 125 mL (1 mg/mL) infusion, 5-15 mg/hr, Intravenous, Continuous, Ivor Costa, MD, Paused at 09/08/20 0725 .  docusate sodium (COLACE) capsule 100 mg, 100 mg, Oral, Daily, Ivor Costa, MD, 100 mg at 09/08/20 0930 .  doxycycline (VIBRA-TABS) tablet 100 mg, 100 mg, Oral, Q12H, Nicole Kindred A, DO, 100 mg at 09/08/20 2119 .  feeding supplement (BOOST / RESOURCE BREEZE) liquid 1 Container, 1 Container, Oral, TID BM, Nicole Kindred A, DO, 1 Container at 09/08/20 [phone] .  fentaNYL (DURAGESIC) 25 MCG/HR 1 patch, 1 patch, Transdermal, Q72H, Ivor Costa, MD, 1 patch at 09/06/20 1954 .  gabapentin (NEURONTIN) capsule 300 mg, 300 mg, Oral, TID, Ivor Costa, MD, 300 mg at 09/08/20 2119 .  lidocaine-prilocaine (EMLA) cream 1 application, 1 application, Topical, PRN, Ivor Costa, MD .  magnesium sulfate IVPB 2 g 50 mL, 2 g, Intravenous, Once, Nicole Kindred A, DO .  melatonin tablet 10 mg, 10 mg, Oral, QHS PRN, Ivor Costa, MD .  metoprolol tartrate (LOPRESSOR) tablet 50 mg, 50 mg, Oral, BID, Ivor Costa, MD, 50 mg at 09/08/20 2119 .   multivitamin with minerals tablet 1 tablet, 1 tablet, Oral, Daily, Ivor Costa, MD .  nicotine (NICODERM CQ - dosed in mg/24 hours) patch 21 mg, 21 mg, Transdermal, Daily, Ivor Costa, MD, 21 mg at 09/08/20 0945 .  ondansetron (ZOFRAN) injection 4 mg, 4 mg, Intravenous, Q8H PRN, Ivor Costa, MD .  oxyCODONE-acetaminophen (PERCOCET/ROXICET) 5-325 MG per tablet 1 tablet, 1 tablet, Oral, Q4H PRN, Ivor Costa, MD .  polyethylene glycol powder (GLYCOLAX/MIRALAX) container 255 g, 1 Container, Oral, Daily PRN, Ivor Costa, MD .  potassium chloride (KLOR-CON) packet 40 mEq, 40 mEq, Oral, Once, Lu Duffel, RPH .  potassium chloride SA (KLOR-CON) CR tablet 40 mEq, 40 mEq, Oral, Once, Nicole Kindred A, DO .  prochlorperazine (COMPAZINE) tablet 10 mg, 10 mg, Oral, Q6H PRN, Nicole Kindred A, DO .  sodium chloride flush (NS) 0.9 % injection 10-40 mL, 10-40 mL, Intracatheter, Q12H, Nicole Kindred A, DO, 10 mL at 09/08/20 0945 .  sodium chloride flush (NS) 0.9 % injection 10-40 mL, 10-40 mL, Intracatheter, PRN, Ezekiel Slocumb, DO   Physical exam:  Vitals:   09/08/20 2100 09/09/20 0300 09/09/20 0400 09/09/20 0522  BP: 116/72 136/81 130/76   Pulse: 77  (!) 55   Resp: (!) 25 10 12    Temp: 97.8 F (36.6 C)  97.9 F (36.6 C)   TempSrc: Oral  Oral   SpO2: 98%  100%   Weight:    136 lb 11.2 oz (62 kg)  Height:       Physical Exam Constitutional:      General: She is not in acute distress.    Appearance: She is not diaphoretic.     Comments: Frail appearance  HENT:     Head: Normocephalic and atraumatic.     Nose: Nose normal.     Mouth/Throat:     Pharynx: No oropharyngeal exudate.     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    Pupils: Pupils are equal, round, and reactive to light.  Cardiovascular:     Rate and Rhythm: Normal rate and regular rhythm.     Heart sounds: No murmur heard.    Pulmonary:     Effort: Pulmonary effort is normal. No respiratory distress.     Breath sounds: No rales.     Comments: Decreased breath sound bilaterally Chest:     Chest wall: No tenderness.  Abdominal:     General: There is no distension.     Palpations: Abdomen is soft.     Tenderness: There is no abdominal tenderness.  Musculoskeletal:        General: Normal range of motion.     Cervical back: Normal range of motion and neck supple.  Skin:    General: Skin is warm and dry.     Findings: No erythema.  Neurological:     Mental Status: She is alert and oriented to person, place, and time.     Cranial Nerves: No cranial nerve deficit.     Motor: No abnormal muscle tone.     Coordination: Coordination normal.  Psychiatric:        Mood and Affect: Affect normal.         CMP Latest Ref Rng & Units 09/09/2020  Glucose 70 - 99 mg/dL 85  BUN 8 - 23 mg/dL 14  Creatinine 0.44 - 1.00 mg/dL 0.30(L)  Sodium 135 - 145 mmol/L 133(L)  Potassium 3.5 - 5.1 mmol/L 3.9  Chloride 98 - 111 mmol/L 100  CO2 22 - 32 mmol/L 28  Calcium 8.9 - 10.3 mg/dL 7.6(L)  Total Protein 6.5 - 8.1 g/dL -  Total Bilirubin 0.3 - 1.2 mg/dL -  Alkaline Phos 38 - 126 U/L -  AST 15 - 41 U/L -  ALT 0 - 44 U/L -   CBC Latest Ref Rng & Units 09/09/2020  WBC 4.0 - 10.5 K/uL 4.0  Hemoglobin 12.0 - 15.0 g/dL 8.9(L)  Hematocrit 36 -  46 % 26.4(L)  Platelets 150 - 400 K/uL 289    RADIOGRAPHIC STUDIES: I have personally reviewed the radiological images as listed and agreed with the findings in the report. DG Chest 2 View  Result Date: 08/10/2020 CLINICAL DATA:  Dizziness. EXAM: CHEST - 2 VIEW COMPARISON:  Chest CT 08/08/2020 FINDINGS: Stable cardiac and mediastinal contours. Port-A-Cath tip projects over the superior vena cava. Monitoring leads overlie the patient. No large area of pulmonary consolidation. No pleural effusion or pneumothorax. Thoracic spine degenerative changes. IMPRESSION: No active  cardiopulmonary disease. Electronically Signed   By: Lovey Newcomer M.D.   On: 08/10/2020 15:16   CT Chest W Contrast  Result Date: 09/06/2020 CLINICAL DATA:  Follow-up abnormal chest x-ray EXAM: CT CHEST WITH CONTRAST TECHNIQUE: Multidetector CT imaging of the chest was performed during intravenous contrast administration. CONTRAST:  30mL OMNIPAQUE IOHEXOL 300 MG/ML  SOLN COMPARISON:  Chest x-ray from earlier in the same day and CT from 08/08/2020. FINDINGS: Cardiovascular: Thoracic aorta demonstrates atherosclerotic calcifications. No dissection is noted. No aneurysmal dilatation is seen. The pulmonary artery as visualized shows no pulmonary embolism although not timed for true embolus evaluation. No cardiac enlargement is seen. No coronary calcifications are noted. Left-sided chest wall port is noted in satisfactory position. Mediastinum/Nodes: Thoracic inlet is within normal limits. Scattered right hilar and subcarinal lymph nodes are seen. The largest of the hilar lymph nodes measures approximately 13 mm in short axis. Subcarinal node measures 10 mm in short axis. The esophagus as visualized is within normal limits. Lungs/Pleura: Lungs are well aerated bilaterally. The previously seen chest x-ray abnormality lies in the posterior aspect of the right upper lobe and is pleural base consistent with acute pneumonia. Patchy similar infiltrates are noted in the right middle lobe as well as the lingula on the left. The appearance of cavitary component seen on prior plain film is not borne out on the CT and likely projectional in nature. No sizable parenchymal nodule is seen. No effusion or pneumothorax is noted. Upper Abdomen: Visualized upper abdomen shows mild fatty infiltration of the liver. Musculoskeletal: Degenerative changes of the thoracic spine are noted. No acute rib abnormality is noted. No compression deformity is seen. IMPRESSION: Patchy multifocal infiltrate worst in the right upper lobe posteriorly and  laterally consistent with that seen on recent plain film examination. This is new from the prior CT consistent with multifocal pneumonia. Followup PA and lateral chest X-ray is recommended in 3-4 weeks following trial of antibiotic therapy to ensure resolution. Fatty liver. No other focal abnormality is noted. Aortic Atherosclerosis (ICD10-I70.0). Electronically Signed   By: Inez Catalina M.D.   On: 09/06/2020 16:12   DG Chest Portable 1 View  Result Date: 09/06/2020 CLINICAL DATA:  Weakness.  Hypotension. EXAM: PORTABLE CHEST 1 VIEW COMPARISON:  CT 08/08/2020.  Chest x-ray 08/08/2020. FINDINGS: PowerPort catheter noted with tip over superior vena cava. Cardiomegaly. No pulmonary venous congestion. Multifocal right lung infiltrates noted. Tiny cavitary infiltrate right upper lung cannot be excluded. Mild left base atelectasis scratched it mild bibasilar atelectasis. No pleural effusion or pneumothorax. IMPRESSION: 1. PowerPort catheter with tip over superior vena cava. 2. Multifocal right lung infiltrates noted. Tiny cavitary infiltrate right upper lung cannot be excluded. Electronically Signed   By: Marcello Moores  Register   On: 09/06/2020 13:26    Assessment and plan-   #Maxillary sinus cancer, patient has been on concurrent chemoradiation. I have discussed with radiation oncology Dr. Baruch Gouty and we both agree upon holding chemotherapy and radiation  at this point until she fully recovers from current acute infection.  #Multifocal pneumonia, improving.  Continue antibiotics.  There is plan to convert her to oral antibiotics. #Atrial fibrillation with RVR Currently appears in sinus rhythm continue Eliquis and metoprolol.  Off Cardizem drip. #Electrolyte derangement, hypokalemia and hypomagnesia have improved. This is likely secondary to cisplatin treatments.  Recommend patient to resume oral chronic potassium and magnesium supplementation upon discharge.  #Chronic dysphagia I discussed with her about  option of PEG tube placement and she declined    Thank you for allowing me to participate in the care of this patient.   Earlie Server, MD, PhD Hematology Oncology [facility] Pager- [phone] 09/09/2020

## 2020-09-09 NOTE — Progress Notes (Signed)
Was in giving patient her night medications and her HR went into the 170s sustained. Camera operator and brenda Randol Kern the covering provider notified. Patient elevated to a mews red and placed on an amio drip. Marcel Camera operator cosigning on drip. Will continue to monitor.

## 2020-09-09 NOTE — Progress Notes (Signed)
Mobility Specialist - Progress Note   09/09/20 1600  Mobility  Activity Ambulated in room  Level of Assistance Standby assist, set-up cues, supervision of patient - no hands on  Assistive Device None (Pt pushed IV pole)  Distance Ambulated (ft) 120 ft  Mobility Response Tolerated well  Mobility performed by Mobility specialist  $Mobility charge 1 Mobility    Pre-mobility: 63 HR, 98% SpO2 Post-mobility: 66 HR, 99% SpO2   Pt was lying in bed upon arrival. Pt agreed to session. Pt denied pain or nausea, but did c/o feeling tired prior to activity. Pt was able to get EOB and stand with supervision. Pt ambulated 120' in room with no LOB, pushing own IV pole. CGA utilized for safety precautions. Pt denied weakness in LE, but did present with a slightly shaky gait at a decreased pace. Upon return to EOB, pt c/o feeling slightly SOB, O2 sat at 99%. Overall, pt tolerated session well. Pt was left in bed with all needs in reach and alarm set.    Kathee Delton Mobility Specialist 09/09/20, 4:19 PM

## 2020-09-09 NOTE — Progress Notes (Signed)
   09/09/20 2125  Assess: MEWS Score  Temp 97.6 F (36.4 C)  BP 100/61  Pulse Rate (!) 174  ECG Heart Rate (!) 174  Resp 12  Level of Consciousness Alert  SpO2 100 %  O2 Device Room Air  Assess: MEWS Score  MEWS Temp 0  MEWS Systolic 1  MEWS Pulse 3  MEWS RR 1  MEWS LOC 0  MEWS Score 5  MEWS Score Color Red  Assess: if the MEWS score is Yellow or Red  Were vital signs taken at a resting state? Yes  Focused Assessment Change from prior assessment (see assessment flowsheet)  Early Detection of Sepsis Score *See Row Information* Low  MEWS guidelines implemented *See Row Information* Yes  Treat  Pain Scale 0-10  Pain Score 0  Take Vital Signs  Increase Vital Sign Frequency  Red: Q 1hr X 4 then Q 4hr X 4, if remains red, continue Q 4hrs  Notify: Charge Nurse/RN  Name of Charge Nurse/RN Notified Kristine Garbe  Date Charge Nurse/RN Notified 09/09/20  Time Charge Nurse/RN Notified 2130  Notify: Provider  Provider Name/Title Darnell Level NP  Date Provider Notified 09/09/20  Time Provider Notified 2128  Notification Type Page  Notification Reason Change in status (HR 174 sustaining and BP dropping)  Response See new orders  Date of Provider Response 09/09/20  Time of Provider Response 2129  Document  Patient Outcome Stabilized after interventions  Progress note created (see row info) Yes

## 2020-09-09 NOTE — Plan of Care (Signed)

## 2020-09-09 NOTE — Evaluation (Signed)
Physical Therapy Evaluation Patient Details Name: Shannon Schroeder MRN: [id number] DOB: May 13, 1956 Today's Date: 09/09/2020   History of Present Illness  Shannon Schroeder is a 89yoF who comes to [facility] on 11/5 after found hypotensive, cyanoic, weak during chemotherapy session. Pt arrived to ED in AF cRVR, HR 150-160s. Pt also undergoing raditation therapy. Pt admitted with sepsis 2/2 PNA.  Clinical Impression  Pt admitted with above diagnosis. Pt currently with functional limitations due to the deficits listed below (see "PT Problem List"). Upon entry, pt in bed, awake and agreeable to participate. The pt is alert and oriented x4, pleasant, conversational, and generally a good historian. Pt has been AMB to BR with RN last couple days. Pt modI for bed mobility, supervision for tranfers due to weakness and unsteadiness. Pt AMB with unsteadiness in room without AD and at minGuardA. Pt allowed to AMB tiwce around unit without device at minGuardA for balance remediation, multiple LOB, but author allows pt to perform righting as able without prematurely providing minA. Pt has weakness in legs and leg pain at end fof AMB. Pt encouraged to use RW for safety upon DC. Functional mobility assessment demonstrates increased effort/time requirements, poor tolerance, and need for physical assistance, whereas the patient performed these at a higher level of independence PTA. Pt will benefit from skilled PT intervention to increase independence and safety with basic mobility in preparation for discharge to the venue listed below.      Follow Up Recommendations Home health PT;Supervision for mobility/OOB    Equipment Recommendations  Rolling walker with 5" wheels    Recommendations for Other Services       Precautions / Restrictions Precautions Precautions: Fall Restrictions Weight Bearing Restrictions: No      Mobility  Bed Mobility Overal bed mobility: Modified Independent                   Transfers Overall transfer level: Needs assistance Equipment used: None Transfers: Sit to/from Stand Sit to Stand: Supervision         General transfer comment: unsteadiness upon standing  Ambulation/Gait Ambulation/Gait assistance: Min guard Gait Distance (Feet): 270 Feet Assistive device: None Gait Pattern/deviations: Ataxic;Step-through pattern Gait velocity: 0.57m/s   General Gait Details: legs hurt, fatigued, knees prebuckling after 221ft  Stairs            Wheelchair Mobility    Modified Rankin (Stroke Patients Only)       Balance                                             Pertinent Vitals/Pain      Home Living Family/patient expects to be discharged to:: Private residence Living Arrangements: Non-relatives/Friends (roommate)   Type of Home: House Home Access: Stairs to enter Entrance Stairs-Rails: Can reach both Entrance Stairs-Number of Steps: 4 Home Layout: One level Home Equipment: None Additional Comments: PTA, tolerating household to limited community distances    Prior Function Level of Independence: Independent               Hand Dominance        Extremity/Trunk Assessment   Upper Extremity Assessment Upper Extremity Assessment: Overall WFL for tasks assessed    Lower Extremity Assessment Lower Extremity Assessment: Generalized weakness;Overall [facility] for tasks assessed    Cervical / Trunk Assessment Cervical / Trunk Assessment: Normal  Communication  Cognition Arousal/Alertness: Awake/alert Behavior During Therapy: WFL for tasks assessed/performed Overall Cognitive Status: Within Functional Limits for tasks assessed                                        General Comments      Exercises     Assessment/Plan    PT Assessment Patient needs continued PT services  PT Problem List Decreased strength;Decreased activity tolerance;Decreased balance;Decreased mobility;Decreased  knowledge of use of DME       PT Treatment Interventions DME instruction;Balance training;Gait training;Stair training;Functional mobility training;Therapeutic activities;Therapeutic exercise;Patient/family education    PT Goals (Current goals can be found in the Care Plan section)  Acute Rehab PT Goals Patient Stated Goal: return to home, contiinue with CA therapies PT Goal Formulation: With patient Time For Goal Achievement: 09/23/20 Potential to Achieve Goals: Good    Frequency Min 2X/week   Barriers to discharge        Co-evaluation               AM-PAC PT "6 Clicks" Mobility  Outcome Measure Help needed turning from your back to your side while in a flat bed without using bedrails?: None Help needed moving from lying on your back to sitting on the side of a flat bed without using bedrails?: None Help needed moving to and from a bed to a chair (including a wheelchair)?: A Little Help needed standing up from a chair using your arms (e.g., wheelchair or bedside chair)?: A Little Help needed to walk in hospital room?: A Little Help needed climbing 3-5 steps with a railing? : A Little 6 Click Score: 20    End of Session Equipment Utilized During Treatment: Gait belt Activity Tolerance: Patient tolerated treatment well;Patient limited by fatigue Patient left: in bed;with call bell/phone within reach;with bed alarm set   PT Visit Diagnosis: Unsteadiness on feet (R26.81);Difficulty in walking, not elsewhere classified (R26.2);Muscle weakness (generalized) (M62.81)    Time: 0272-5366 PT Time Calculation (min) (ACUTE ONLY): 16 min   Charges:   PT Evaluation $PT Eval Moderate Complexity: 1 Mod PT Treatments $Therapeutic Exercise: 8-22 mins        2:51 PM, 09/09/20 Etta Grandchild, PT, DPT Physical Therapist - [facility]  [phone] ([place])    [employer] C 09/09/2020, 2:49 PM

## 2020-09-09 NOTE — Progress Notes (Signed)
PROGRESS NOTE    Shannon Schroeder   WGN:[id number]  DOB: 01-07-1956  PCP: Pcp, No    DOA: 09/06/2020 LOS: 3   Brief Narrative   Shannon Schroeder is a 64 y.o. 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  She was found to be in A-fib with RVR, rates up to 150-160's.  Also hypotensive 83/68 which responded to IV fluids.  Evaluation in the ED revealed multifocal pneumonia.  Started on broad spectrum antibiotics and admitted to hospitalist service.    Regarding maxillary cancer, this is a recent diagnosis in early Sept after pt presented with ongoing progressive facial pain.  She is followed by Dr. Tasia Catchings and currently undergoing chemo and radiation therapy.  Last cycle of chemotherapy was Monday 11/1.  She was to receive radiation on 11/5, but sent to ED as outlined above.     Assessment & Plan   Principal Problem:   Multifocal pneumonia Active Problems:   Hypertension   COPD (chronic obstructive pulmonary disease) (HCC)   Tobacco abuse   TIA (transient ischemic attack)   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HCC)   Atrial fibrillation with RVR (HCC)   Elevated troponin   Severe sepsis (HCC)   Hypomagnesemia   Dysphagia   Severe sepsis - present on admission, due to PNA.  Severe sepsis evidenced by tachycardia, tachypnea, hypotension reflecting organ dysfunction, in setting on PNA.  Treated per protocol in the ED with IV fluid resuscitation and IV antibiotics.  Multifocal Pneumonia - present on admission as outlined above.  Given frequent healthcare visits for cancer recently, covering for healthcare-associated PNA.   CT chest showed infiltrates worse in posterior RUL, but also in RML and lingula on left.  No  cavitary component seen on CT as was in xray report.  Strep pneumo antigen negative. 11/8 - sputum culture with normal respiratory flora, no growth.   --Initially covered broad spectrum with Vancomycin, Cefepime, Zithromax --Continue IV Rocephin and Doxycycline (11/7 >> ) --F/u Legionella antigen --F/u blood cultures - neg to date --Mucinex --PRN albuterol  A-fib with RVR - POA with HR's 150-160's.  No chest pain.  RVR being driven by infection.  TSH normal. 11/6: Rate improved, intermittently spikes into 120's per charted vitals 11/7: rate controlled, off Cardizem drip, HR in 60's 11/8: sinus rhythm on monitor HR 60's --Continue Eliquis and metoprolol --Monitor on telemetry  Generalized weakness - PT consulted for evaluation.  Elevated troponin - POA, mild 27>>25, likely demand ischemia in setting of hypotension and A-fib RVR.  No chest pain.  Monitor.  Electrolyte derangements - presented with hypokalemia (K 2.8) and hypomagnesemia (Mg 1.4), which were replaced.  Monitor BMP, Mg.  Replace further as needed.  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  Follows with Dr. Tasia Catchings and currently on chemotherapy and radiation.  Last chemo Monday 11/1.  Was to have radiation day of admission, but found hypotensive and in rapid A-fib, now admitted, treatment was not able to be done. --Continue Decadron --Diet as tolerated, dysphagia 3 is ordered - can change per patient preference  Essential hypertension - hypotensive on admission. --Continue metoprolol for A-fib --Hold home hydralazine and lisinopril until BP improves  COPD - stable, not acutely exacerbated.  Bronchodilators as above.  Hx of TIA -  on Eliquis for A-fib  Tobacco abuse - nicotine patch.  Counseled regarding cessation.    DVT prophylaxis:  apixaban (ELIQUIS) tablet 5 mg   Diet:  Diet Orders (From admission, onward)    Start     Ordered   09/08/20 0916  DIET DYS 3 Room service appropriate? Yes; Fluid  consistency: Thin  Diet effective now       Comments: SEND PLASTIC SILVERWARE ONLY  Question Answer Comment  Room service appropriate? Yes   Fluid consistency: Thin      09/08/20 0916            Code Status: Full Code    Subjective 09/09/20    Pt seen at bedside this AM.  Says she had increased difficulty swallowing during breakfast this AM (omelet), but still doing okay with liquids.  Says she has good and bad days with swallowing.  Has not been up out of bed, unsure if her weakness is any better yet.     Disposition Plan & Communication   Status is: Inpatient  Remains inpatient appropriate because:IV treatments appropriate due to intensity of illness or inability to take PO.  Continuing IV antibiotics as above, expect d/c tomorrow.   Dispo: The patient is from: Home              Anticipated d/c is to: Home              Anticipated d/c date is: 1 day              Patient currently is not medically stable to d/c.    Family Communication: none at bedside during encounter.   Contact person is patient's roommate, [name] [phone]    Consults, Procedures, Significant Events   Consultants:   Oncology, Dr. Tasia Catchings  Procedures:   None  Antimicrobials:  Anti-infectives (From admission, onward)   Start     Dose/Rate Route Frequency Ordered Stop   09/08/20 1500  cefTRIAXone (ROCEPHIN) 2 g in sodium chloride 0.9 % 100 mL IVPB        2 g 200 mL/hr over 30 Minutes Intravenous Every 24 hours 09/08/20 1359     09/08/20 1500  doxycycline (VIBRA-TABS) tablet 100 mg        100 mg Oral Every 12 hours 09/08/20 1403     09/07/20 1600  azithromycin (ZITHROMAX) 500 mg in sodium chloride 0.9 % 250 mL IVPB  Status:  Discontinued        500 mg 250 mL/hr over 60 Minutes Intravenous Every 24 hours 09/06/20 1702 09/08/20 1403   09/07/20 0800  vancomycin (VANCOREADY) IVPB 500 mg/100 mL  Status:  Discontinued        500 mg 100 mL/hr over 60 Minutes Intravenous Every 12 hours 09/06/20 1717  09/08/20 1357   09/06/20 2200  ceFEPIme (MAXIPIME) 2 g in sodium chloride 0.9 % 100 mL IVPB  Status:  Discontinued        2 g 200 mL/hr over 30 Minutes Intravenous Every 12 hours 09/06/20 1717 09/08/20 1359   09/06/20 1630  vancomycin (VANCOCIN) IVPB 1000 mg/200 mL premix        1,000 mg 200 mL/hr over 60 Minutes Intravenous  Once 09/06/20 1626 09/06/20 1940   09/06/20 1630  ceFEPIme (MAXIPIME) 2 g in sodium chloride 0.9 % 100 mL IVPB        2 g 200 mL/hr over 30 Minutes Intravenous  Once 09/06/20 1626 09/06/20 1709   09/06/20 1630  azithromycin (ZITHROMAX) 500  mg in sodium chloride 0.9 % 250 mL IVPB        500 mg 250 mL/hr over 60 Minutes Intravenous  Once 09/06/20 1626 09/06/20 1752         Objective   Vitals:   09/09/20 0522 09/09/20 0826 09/09/20 0844 09/09/20 1118  BP:   119/90   Pulse:  77 69 63  Resp:   14   Temp:  98.2 F (36.8 C)  98.3 F (36.8 C)  TempSrc:  Oral  Oral  SpO2:  97%    Weight: 62 kg     Height:        Intake/Output Summary (Last 24 hours) at 09/09/2020 1624 Last data filed at 09/09/2020 1502 Gross per 24 hour  Intake 2529.05 ml  Output 1 ml  Net 2528.05 ml   Filed Weights   09/06/20 1246 09/07/20 1658 09/09/20 0522  Weight: 51.4 kg 61.2 kg 62 kg    Physical Exam:  General exam: awake, alert, no acute distress HEENT: right facial swelling stable, muffled voice unchanged Respiratory system: CTAB, no wheezes, normal respiratory effort, on room air. Cardiovascular system: normal S1/S2, RRR, in sinus rhythm on monitor, no pedal edema.   Central nervous system: A&O x4, grossly non-focal exam Psychiatry: normal mood, congruent affect, judgement and insight appear normal  Labs   Data Reviewed: I have personally reviewed following labs and imaging studies  CBC: Recent Labs  Lab 09/04/20 1249 09/04/20 1249 09/06/20 1141 09/06/20 1141 09/06/20 1243 09/07/20 0802 09/08/20 0343 09/09/20 0453 09/09/20 1348  WBC 7.5   < > 6.7  --  8.2 4.5  5.0 4.0  --   NEUTROABS 5.9  --  5.3  --  6.4  --   --   --   --   HGB 9.8*   < > 10.0*   < > 10.0* 10.7* 11.1* 8.9* 9.8*  HCT 29.5*   < > 29.7*   < > 30.7* 31.8* 32.3* 26.4* 29.2*  MCV 100.0   < > 98.7  --  100.0 98.1 97.3 97.8  --   PLT 367   < > 332  --  338 353 363 289  --    < > = values in this interval not displayed.   Basic Metabolic Panel: Recent Labs  Lab 09/06/20 1141 09/06/20 1243 09/07/20 0802 09/07/20 1430 09/08/20 0343 09/09/20 0453  NA 136 140 131*  --  132* 133*  K 2.7* 2.8* 3.3*  --  3.6 3.9  CL 97* 99 94*  --  96* 100  CO2 27 26 24   --  27 28  GLUCOSE 133* 107* 117*  --  112* 85  BUN 13 12 8   --  10 14  CREATININE 0.49 0.39* 0.45  --  0.52 0.30*  CALCIUM 7.9* 7.6* 7.7*  --  7.9* 7.6*  MG 1.4* 1.4*  --  1.6* 1.7 1.7   GFR: Estimated Creatinine Clearance: 65.6 mL/min (A) (by C-G formula based on SCr of 0.3 mg/dL (L)). Liver Function Tests: Recent Labs  Lab 09/04/20 1249 09/06/20 1141 09/06/20 1243  AST 28 20 19   ALT 17 17 16   ALKPHOS 90 86 85  BILITOT 0.5 0.5 0.7  PROT 5.8* 5.7* 5.6*  ALBUMIN 2.7* 2.6* 2.6*   No results for input(s): LIPASE, AMYLASE in the last 168 hours. No results for input(s): AMMONIA in the last 168 hours. Coagulation Profile: No results for input(s): INR, PROTIME in the last 168 hours. Cardiac Enzymes: No results  for input(s): CKTOTAL, CKMB, CKMBINDEX, TROPONINI in the last 168 hours. BNP (last 3 results) No results for input(s): PROBNP in the last 8760 hours. HbA1C: Recent Labs    09/07/20 0802  HGBA1C 5.4   CBG: No results for input(s): GLUCAP in the last 168 hours. Lipid Profile: No results for input(s): CHOL, HDL, LDLCALC, TRIG, CHOLHDL, LDLDIRECT in the last 72 hours. Thyroid Function Tests: Recent Labs    09/07/20 0802  TSH 3.199   Anemia Panel: Recent Labs    09/09/20 0938  FOLATE 11.2  FERRITIN 136  TIBC 210*  IRON 46  RETICCTPCT 1.6   Sepsis Labs: Recent Labs  Lab 09/06/20 1243  PROCALCITON  <0.10  LATICACIDVEN 1.9    Recent Results (from the past 240 hour(s))  Blood culture (routine x 2)     Status: None (Preliminary result)   Collection Time: 09/06/20 12:53 PM   Specimen: BLOOD  Result Value Ref Range Status   Specimen Description BLOOD RAC  Final   Special Requests   Final    BOTTLES DRAWN AEROBIC AND ANAEROBIC Blood Culture adequate volume   Culture   Final    NO GROWTH 3 DAYS Performed at [facility], [address] [place], [place] [address]    Report Status PENDING  Incomplete  Respiratory Panel by RT PCR (Flu A&B, Covid) - Nasopharyngeal Swab     Status: None   Collection Time: 09/06/20  1:01 PM   Specimen: Nasopharyngeal Swab  Result Value Ref Range Status   SARS Coronavirus 2 by RT PCR NEGATIVE NEGATIVE Final    Comment: (NOTE) SARS-CoV-2 target nucleic acids are NOT DETECTED.  The SARS-CoV-2 RNA is generally detectable in upper respiratoy specimens during the acute phase of infection. The lowest concentration of SARS-CoV-2 viral copies this assay can detect is 131 copies/mL. A negative result does not preclude SARS-Cov-2 infection and should not be used as the sole basis for treatment or other patient management decisions. A negative result may occur with  improper specimen collection/handling, submission of specimen other than nasopharyngeal swab, presence of viral mutation(s) within the areas targeted by this assay, and inadequate number of viral copies (<131 copies/mL). A negative result must be combined with clinical observations, patient history, and epidemiological information. The expected result is Negative.  Fact Sheet for Patients:  PinkCheek.be  Fact Sheet for Healthcare Providers:  GravelBags.it  This test is no t yet approved or cleared by the [place] FDA and  has been authorized for detection and/or diagnosis of SARS-CoV-2 by FDA under an Emergency Use  Authorization (EUA). This EUA will remain  in effect (meaning this test can be used) for the duration of the COVID-19 declaration under Section 564(b)(1) of the Act, 21 U.S.C. section 360bbb-3(b)(1), unless the authorization is terminated or revoked sooner.     Influenza A by PCR NEGATIVE NEGATIVE Final   Influenza B by PCR NEGATIVE NEGATIVE Final    Comment: (NOTE) The Xpert Xpress SARS-CoV-2/FLU/RSV assay is intended as an aid in  the diagnosis of influenza from Nasopharyngeal swab specimens and  should not be used as a sole basis for treatment. Nasal washings and  aspirates are unacceptable for Xpert Xpress SARS-CoV-2/FLU/RSV  testing.  Fact Sheet for Patients: PinkCheek.be  Fact Sheet for Healthcare Providers: GravelBags.it  This test is not yet approved or cleared by the [place] FDA and  has been authorized for detection and/or diagnosis of SARS-CoV-2 by  FDA under an Emergency Use Authorization (EUA). This EUA  will remain  in effect (meaning this test can be used) for the duration of the  Covid-19 declaration under Section 564(b)(1) of the Act, 21  U.S.C. section 360bbb-3(b)(1), unless the authorization is  terminated or revoked. Performed at [facility], [address] [place], [place] [address]   Blood culture (routine x 2)     Status: None (Preliminary result)   Collection Time: 09/06/20  1:15 PM   Specimen: BLOOD  Result Value Ref Range Status   Specimen Description BLOOD LA  Final   Special Requests   Final    BOTTLES DRAWN AEROBIC AND ANAEROBIC Blood Culture adequate volume   Culture   Final    NO GROWTH 3 DAYS Performed at [facility], [address], [place], [place] [address]    Report Status PENDING  Incomplete  MRSA PCR Screening     Status: Abnormal   Collection Time: 09/06/20  5:10 PM   Specimen: Nasopharyngeal  Result Value Ref Range Status   MRSA by PCR POSITIVE  (A) NEGATIVE Final    Comment:        The GeneXpert MRSA Assay (FDA approved for NASAL specimens only), is one component of a comprehensive MRSA colonization surveillance program. It is not intended to diagnose MRSA infection nor to guide or monitor treatment for MRSA infections. RESULT CALLED TO, READ BACK BY AND VERIFIED WITH: LINDSAY BLACK ON 09/06/20 AT 2130 QSD Performed at [facility], [address] [place], [place] [address]   Culture, sputum-assessment     Status: None   Collection Time: 09/06/20  7:27 PM   Specimen: Sputum  Result Value Ref Range Status   Specimen Description SPUTUM  Final   Special Requests NONE  Final   Sputum evaluation   Final    THIS SPECIMEN IS ACCEPTABLE FOR SPUTUM CULTURE Performed at [facility], [address] [place], [place] [address]    Report Status 09/06/2020 FINAL  Final  Culture, respiratory     Status: None   Collection Time: 09/06/20  7:27 PM   Specimen: SPU  Result Value Ref Range Status   Specimen Description   Final    SPUTUM Performed at [facility], [address], [place], [place] [address]    Special Requests   Final    NONE Reflexed from [id number] Performed at [facility], [address] [place], [place] [address]    Gram Stain   Final    ABUNDANT WBC PRESENT, PREDOMINANTLY PMN MODERATE GRAM POSITIVE COCCI FEW GRAM POSITIVE RODS RARE GRAM NEGATIVE RODS    Culture   Final    Consistent with normal respiratory flora. No Pseudomonas species isolated Performed at [facility] [address] [place], [place] [address]    Report Status 09/08/2020 FINAL  Final      Imaging Studies   No results found.   Medications   Scheduled Meds: . apixaban  5 mg Oral BID  . chlorhexidine  10 mL Mouth/Throat BID  . Chlorhexidine Gluconate Cloth  6 each Topical Daily  . dexamethasone  4 mg Oral Daily  . docusate sodium  100 mg Oral Daily  . doxycycline  100 mg Oral Q12H    . feeding supplement  1 Container Oral TID BM  . fentaNYL  1 patch Transdermal Q72H  . gabapentin  300 mg Oral TID  . metoprolol tartrate  50 mg Oral BID  . multivitamin with minerals  1 tablet Oral Daily  .  mupirocin ointment  1 application Nasal BID  . nicotine  21 mg Transdermal Daily  . potassium chloride  40 mEq Oral Once  . potassium chloride  40 mEq Oral Once  . sodium chloride flush  10-40 mL Intracatheter Q12H   Continuous Infusions: . sodium chloride 250 mL (09/09/20 0930)  . cefTRIAXone (ROCEPHIN)  IV 2 g (09/09/20 1541)       LOS: 3 days    Time spent: 25 minutes with > 50% spent in coordination of care and direct patient contact.    Ezekiel Slocumb, DO [facility]  09/09/2020, 4:24 PM    If 7PM-7AM, please contact night-coverage. How to contact the [facility] Attending or Consulting provider [place] or covering provider during after hours [place], for this patient?    1. Check the care team in [facility] and look for a) attending/consulting [facility] provider listed and b) the [facility] team listed 2. Log into [email or web address] and use [facility]'s universal password to access. If you do not have the password, please contact the hospital operator. 3. Locate the [facility] provider you are looking for under [facility] and page to a number that you can be directly reached. 4. If you still have difficulty reaching the provider, please page the [facility] (Director on Call) for the Hospitalists listed on amion for assistance.

## 2020-09-09 NOTE — Plan of Care (Signed)

## 2020-09-10 ENCOUNTER — Ambulatory Visit: Payer: Medicaid Other

## 2020-09-10 DIAGNOSIS — A419 Sepsis, unspecified organism: Principal | ICD-10-CM

## 2020-09-10 DIAGNOSIS — I10 Essential (primary) hypertension: Secondary | ICD-10-CM

## 2020-09-10 DIAGNOSIS — I471 Supraventricular tachycardia: Secondary | ICD-10-CM

## 2020-09-10 DIAGNOSIS — I480 Paroxysmal atrial fibrillation: Secondary | ICD-10-CM

## 2020-09-10 DIAGNOSIS — R6520 Severe sepsis without septic shock: Secondary | ICD-10-CM

## 2020-09-10 LAB — CBC
HCT: 29.6 % — ABNORMAL LOW (ref 36.0–46.0)
Hemoglobin: 9.7 g/dL — ABNORMAL LOW (ref 12.0–15.0)
MCH: 32.8 pg (ref 26.0–34.0)
MCHC: 32.8 g/dL (ref 30.0–36.0)
MCV: 100 fL (ref 80.0–100.0)
Platelets: 329 10*3/uL (ref 150–400)
RBC: 2.96 MIL/uL — ABNORMAL LOW (ref 3.87–5.11)
RDW: 13.4 % (ref 11.5–15.5)
WBC: 5.1 10*3/uL (ref 4.0–10.5)
nRBC: 0 % (ref 0.0–0.2)

## 2020-09-10 LAB — BASIC METABOLIC PANEL
Anion gap: 5 (ref 5–15)
BUN: 10 mg/dL (ref 8–23)
CO2: 29 mmol/L (ref 22–32)
Calcium: 8 mg/dL — ABNORMAL LOW (ref 8.9–10.3)
Chloride: 100 mmol/L (ref 98–111)
Creatinine, Ser: 0.43 mg/dL — ABNORMAL LOW (ref 0.44–1.00)
GFR, Estimated: >60 mL/min (ref >60)
Glucose, Bld: 82 mg/dL (ref 70–99)
Potassium: 4.3 mmol/L (ref 3.5–5.1)
Sodium: 134 mmol/L — ABNORMAL LOW (ref 135–145)

## 2020-09-10 LAB — MAGNESIUM: Magnesium: 2.3 mg/dL (ref 1.7–2.4)

## 2020-09-10 MED ORDER — NICOTINE 21 MG/24HR TD PT24
21.0000 mg | MEDICATED_PATCH | Freq: Every day | TRANSDERMAL | Status: DC
  Administered 2020-09-11: 21 mg via TRANSDERMAL
  Filled 2020-09-10: qty 1

## 2020-09-10 MED ORDER — AMIODARONE HCL 200 MG PO TABS
400.0000 mg | ORAL_TABLET | Freq: Two times a day (BID) | ORAL | Status: DC
  Administered 2020-09-10 – 2020-09-11 (×2): 400 mg via ORAL
  Filled 2020-09-10 (×2): qty 2

## 2020-09-10 MED ORDER — CHLORHEXIDINE GLUCONATE CLOTH 2 % EX PADS: 6.0000 | MEDICATED_PAD | Freq: Every day | CUTANEOUS | Status: DC

## 2020-09-10 MED ORDER — AMIODARONE HCL 200 MG PO TABS: 400.0000 mg | ORAL_TABLET | Freq: Two times a day (BID) | ORAL | Status: DC

## 2020-09-10 MED ORDER — POTASSIUM CHLORIDE 10 MEQ/100ML IV SOLN
10.0000 meq | INTRAVENOUS | Status: AC
  Administered 2020-09-10 (×6): 10 meq via INTRAVENOUS
  Filled 2020-09-10 (×6): qty 100

## 2020-09-10 MED ORDER — METOPROLOL TARTRATE 25 MG PO TABS
25.0000 mg | ORAL_TABLET | Freq: Two times a day (BID) | ORAL | Status: DC
  Administered 2020-09-11: 25 mg via ORAL
  Filled 2020-09-10 (×2): qty 1

## 2020-09-10 NOTE — Progress Notes (Signed)
PROGRESS NOTE    Shannon Schroeder   LYY:[id number]  DOB: 07-May-1956  PCP: Pcp, No    DOA: 09/06/2020 LOS: 4   Brief Narrative   Shannon Schroeder is a 64 y.o. 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  She was found to be in A-fib with RVR, rates up to 150-160's.  Also hypotensive 83/68 which responded to IV fluids.  Evaluation in the ED revealed multifocal pneumonia.  Started on broad spectrum antibiotics and admitted to hospitalist service.    Regarding maxillary cancer, this is a recent diagnosis in early Sept after pt presented with ongoing progressive facial pain.  She is followed by Dr. Tasia Catchings and currently undergoing chemo and radiation therapy.  Last cycle of chemotherapy was Monday 11/1.  She was to receive radiation on 11/5, but sent to ED as outlined above.     Assessment & Plan   Principal Problem:   Multifocal pneumonia Active Problems:   Hypertension   COPD (chronic obstructive pulmonary disease) (HCC)   Tobacco abuse   TIA (transient ischemic attack)   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HCC)   Atrial fibrillation with RVR (HCC)   Elevated troponin   Severe sepsis (HCC)   Hypomagnesemia   Dysphagia   Severe sepsis - present on admission, due to multifocal PNA.  Severe sepsis evidenced by tachycardia, tachypnea, hypotension reflecting organ dysfunction, in setting of PNA.  Treated per protocol in the ED with IV fluid resuscitation and IV antibiotics.  Multifocal Pneumonia - present on admission as outlined above.   CT chest showed infiltrates worse in posterior RUL, but also in RML and lingula on left.  No cavitary component seen on CT as was in xray report.  Strep pneumo antigen  negative. 11/8 - sputum culture with normal respiratory flora, no growth.   --Initially covered broad spectrum with Vancomycin, Cefepime, Zithromax  --Continue IV Rocephin and Doxycycline (11/7 >> ).   Will keep on IV abx until d/c or course complete given oral pain and difficulties with swallowing (recommend liquid over pills for PO). --F/u blood cultures - neg to date --Mucinex --PRN albuterol  SVT - overnight 11/8-9 with HR"s into 170's, hypotension systolic in 12'X.  Responded to IV fluids and amio bolus, placed on amio gtt - converted to sinus rhythm.  Normal sinus on monitor this AM.   TSH normal.  --Cardiology consulted. --Started on PO amiodarone 400 mg bid x 1 week, followed by 200 mg bid x 1 week, then 200 mg daily thereafter --metoprolol dose reduced given already bradycardic when in sinus --Telemetry --Maintain K>4.0 and Mg>2.0  A-fib with RVR - POA with HR's 150-160's.  No chest pain.  RVR being driven by infection.  TSH normal.  CHADS2VASc score 3. 11/6: Rate improved, intermittently spikes into 120's per charted vitals 11/7: rate controlled, off Cardizem drip, HR in 60's 11/8-9: sinus rhythm on monitor HR 60's (w exception of SVT episode) --Continue Eliquis and metoprolol --Monitor on telemetry  Generalized weakness - PT consulted for evaluation.  Home health PT recommend, pt declines and has adequate assistance from roommate at home.  Elevated troponin - POA, mild 27>>25, likely demand ischemia in setting of hypotension and A-fib RVR.  No chest pain.  Monitor.  Electrolyte derangements - presented with hypokalemia (K  2.8) and hypomagnesemia (Mg 1.4), which were replaced.  Monitor BMP, Mg.  Replace further as needed.  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  Follows with Dr. Tasia Catchings and currently on chemotherapy and radiation.  Last chemo Monday 11/1.  Was to have radiation day of admission, but found hypotensive and in rapid A-fib, now admitted,  treatment was not able to be done. --Continue Decadron --Diet as tolerated, dysphagia 3 is ordered - can change per patient preference  Essential hypertension - hypotensive on admission. --Continue metoprolol for A-fib --Hold home hydralazine and lisinopril until BP improves  COPD - stable, not acutely exacerbated.  Bronchodilators.  Hx of TIA - on Eliquis for A-fib  Tobacco abuse - nicotine patch.  Counseled regarding cessation.    DVT prophylaxis:  apixaban (ELIQUIS) tablet 5 mg   Diet:  Diet Orders (From admission, onward)    Start     Ordered   09/08/20 0916  DIET DYS 3 Room service appropriate? Yes; Fluid consistency: Thin  Diet effective now       Comments: SEND PLASTIC SILVERWARE ONLY  Question Answer Comment  Room service appropriate? Yes   Fluid consistency: Thin      09/08/20 0916            Code Status: Full Code    Subjective 09/10/20    Pt seen at bedside this AM.  Had an episode of SVT overnight, with hypotension that responded to IV fluid bolus.  Says that at the time she felt extremely tired, continues to feel very tired / fatigued this morning. Was having palpitations at the time. HR now controlled.  No fever/chills.   Disposition Plan & Communication   Status is: Inpatient  Remains inpatient appropriate because:IV treatments appropriate due to intensity of illness or inability to take PO.  Continuing IV antibiotics as above, expect d/c tomorrow.   Dispo: The patient is from: Home              Anticipated d/c is to: Home              Anticipated d/c date is: 1 day              Patient currently is not medically stable to d/c.    Family Communication: none at bedside during encounter.   Contact person is patient's roommate, [name] [phone]    Consults, Procedures, Significant Events   Consultants:   Oncology, Dr. Tasia Catchings  Procedures:   None  Antimicrobials:  Anti-infectives (From admission, onward)   Start     Dose/Rate Route  Frequency Ordered Stop   09/08/20 1500  cefTRIAXone (ROCEPHIN) 2 g in sodium chloride 0.9 % 100 mL IVPB        2 g 200 mL/hr over 30 Minutes Intravenous Every 24 hours 09/08/20 1359     09/08/20 1500  doxycycline (VIBRA-TABS) tablet 100 mg        100 mg Oral Every 12 hours 09/08/20 1403     09/07/20 1600  azithromycin (ZITHROMAX) 500 mg in sodium chloride 0.9 % 250 mL IVPB  Status:  Discontinued        500 mg 250 mL/hr over 60 Minutes Intravenous Every 24 hours 09/06/20 1702 09/08/20 1403   09/07/20 0800  vancomycin (VANCOREADY) IVPB 500 mg/100 mL  Status:  Discontinued        500 mg 100 mL/hr over 60 Minutes Intravenous Every 12 hours 09/06/20 1717 09/08/20 1357   09/06/20 2200  ceFEPIme (  MAXIPIME) 2 g in sodium chloride 0.9 % 100 mL IVPB  Status:  Discontinued        2 g 200 mL/hr over 30 Minutes Intravenous Every 12 hours 09/06/20 1717 09/08/20 1359   09/06/20 1630  vancomycin (VANCOCIN) IVPB 1000 mg/200 mL premix        1,000 mg 200 mL/hr over 60 Minutes Intravenous  Once 09/06/20 1626 09/06/20 1940   09/06/20 1630  ceFEPIme (MAXIPIME) 2 g in sodium chloride 0.9 % 100 mL IVPB        2 g 200 mL/hr over 30 Minutes Intravenous  Once 09/06/20 1626 09/06/20 1709   09/06/20 1630  azithromycin (ZITHROMAX) 500 mg in sodium chloride 0.9 % 250 mL IVPB        500 mg 250 mL/hr over 60 Minutes Intravenous  Once 09/06/20 1626 09/06/20 1752         Objective   Vitals:   09/10/20 0600 09/10/20 0820 09/10/20 1131 09/10/20 1517  BP: 113/71 137/75 119/70 122/78  Pulse:      Resp: 10 17 11 16   Temp:  98.1 F (36.7 C) 97.9 F (36.6 C) 97.6 F (36.4 C)  TempSrc:  Oral Oral Oral  SpO2:  99% 98% 97%  Weight:      Height:        Intake/Output Summary (Last 24 hours) at 09/10/2020 2012 Last data filed at 09/10/2020 1815 Gross per 24 hour  Intake 1359.5 ml  Output 800 ml  Net 559.5 ml   Filed Weights   09/07/20 1658 09/09/20 0522 09/10/20 0346  Weight: 61.2 kg 62 kg 59.8 kg     Physical Exam:  General exam: awake, alert, no acute distress HEENT: right facial swelling stable, muffled voice unchanged Respiratory system: CTAB, no wheezes, normal respiratory effort, on room air. Cardiovascular system: normal S1/S2, RRR, in sinus rhythm on monitor, no pedal edema.   Psychiatry: normal mood, congruent affect, judgement and insight appear normal  Labs   Data Reviewed: I have personally reviewed following labs and imaging studies  CBC: Recent Labs  Lab 09/04/20 1249 09/04/20 1249 09/06/20 1141 09/06/20 1141 09/06/20 1243 09/06/20 1243 09/07/20 0802 09/07/20 0802 09/08/20 0343 09/09/20 0453 09/09/20 1348 09/09/20 2244 09/10/20 0353  WBC 7.5   < > 6.7   < > 8.2   < > 4.5  --  5.0 4.0  --  5.1 5.1  NEUTROABS 5.9  --  5.3  --  6.4  --   --   --   --   --   --   --   --   HGB 9.8*   < > 10.0*   < > 10.0*   < > 10.7*   < > 11.1* 8.9* 9.8* 10.0* 9.7*  HCT 29.5*   < > 29.7*   < > 30.7*   < > 31.8*   < > 32.3* 26.4* 29.2* 29.5* 29.6*  MCV 100.0   < > 98.7   < > 100.0   < > 98.1  --  97.3 97.8  --  99.3 100.0  PLT 367   < > 332   < > 338   < > 353  --  363 289  --  316 329   < > = values in this interval not displayed.   Basic Metabolic Panel: Recent Labs  Lab 09/06/20 1243 09/07/20 0802 09/07/20 1430 09/08/20 0343 09/09/20 0453 09/09/20 2244 09/10/20 0353  NA  --  131*  --  132*  133* 133* 134*  K  --  3.3*  --  3.6 3.9 3.2* 4.3  CL  --  94*  --  96* 100 97* 100  CO2  --  24  --  27 28 26 29   GLUCOSE  --  117*  --  112* 85 94 82  BUN  --  8  --  10 14 12 10   CREATININE  --  0.45  --  0.52 0.30* 0.44 0.43*  CALCIUM  --  7.7*  --  7.9* 7.6* 7.7* 8.0*  MG   < >  --  1.6* 1.7 1.7 1.8 2.3   < > = values in this interval not displayed.   GFR: Estimated Creatinine Clearance: 65.6 mL/min (A) (by C-G formula based on SCr of 0.43 mg/dL (L)). Liver Function Tests: Recent Labs  Lab 09/04/20 1249 09/06/20 1141 09/06/20 1243  AST 28 20 19   ALT 17 17  16   ALKPHOS 90 86 85  BILITOT 0.5 0.5 0.7  PROT 5.8* 5.7* 5.6*  ALBUMIN 2.7* 2.6* 2.6*   No results for input(s): LIPASE, AMYLASE in the last 168 hours. No results for input(s): AMMONIA in the last 168 hours. Coagulation Profile: No results for input(s): INR, PROTIME in the last 168 hours. Cardiac Enzymes: No results for input(s): CKTOTAL, CKMB, CKMBINDEX, TROPONINI in the last 168 hours. BNP (last 3 results) No results for input(s): PROBNP in the last 8760 hours. HbA1C: No results for input(s): HGBA1C in the last 72 hours. CBG: No results for input(s): GLUCAP in the last 168 hours. Lipid Profile: No results for input(s): CHOL, HDL, LDLCALC, TRIG, CHOLHDL, LDLDIRECT in the last 72 hours. Thyroid Function Tests: No results for input(s): TSH, T4TOTAL, FREET4, T3FREE, THYROIDAB in the last 72 hours. Anemia Panel: Recent Labs    09/09/20 0938 09/09/20 0939  VITAMINB12  --  449  FOLATE 11.2  --   FERRITIN 136  --   TIBC 210*  --   IRON 46  --   RETICCTPCT 1.6  --    Sepsis Labs: Recent Labs  Lab 09/06/20 1243  PROCALCITON <0.10  LATICACIDVEN 1.9    Recent Results (from the past 240 hour(s))  Blood culture (routine x 2)     Status: None (Preliminary result)   Collection Time: 09/06/20 12:53 PM   Specimen: BLOOD  Result Value Ref Range Status   Specimen Description BLOOD RAC  Final   Special Requests   Final    BOTTLES DRAWN AEROBIC AND ANAEROBIC Blood Culture adequate volume   Culture   Final    NO GROWTH 4 DAYS Performed at [facility], [address] [place], [place] [address]    Report Status PENDING  Incomplete  Respiratory Panel by RT PCR (Flu A&B, Covid) - Nasopharyngeal Swab     Status: None   Collection Time: 09/06/20  1:01 PM   Specimen: Nasopharyngeal Swab  Result Value Ref Range Status   SARS Coronavirus 2 by RT PCR NEGATIVE NEGATIVE Final    Comment: (NOTE) SARS-CoV-2 target nucleic acids are NOT DETECTED.  The SARS-CoV-2 RNA is  generally detectable in upper respiratoy specimens during the acute phase of infection. The lowest concentration of SARS-CoV-2 viral copies this assay can detect is 131 copies/mL. A negative result does not preclude SARS-Cov-2 infection and should not be used as the sole basis for treatment or other patient management decisions. A negative result may occur with  improper specimen collection/handling, submission of specimen other than nasopharyngeal swab,  presence of viral mutation(s) within the areas targeted by this assay, and inadequate number of viral copies (<131 copies/mL). A negative result must be combined with clinical observations, patient history, and epidemiological information. The expected result is Negative.  Fact Sheet for Patients:  PinkCheek.be  Fact Sheet for Healthcare Providers:  GravelBags.it  This test is no t yet approved or cleared by the [place] FDA and  has been authorized for detection and/or diagnosis of SARS-CoV-2 by FDA under an Emergency Use Authorization (EUA). This EUA will remain  in effect (meaning this test can be used) for the duration of the COVID-19 declaration under Section 564(b)(1) of the Act, 21 U.S.C. section 360bbb-3(b)(1), unless the authorization is terminated or revoked sooner.     Influenza A by PCR NEGATIVE NEGATIVE Final   Influenza B by PCR NEGATIVE NEGATIVE Final    Comment: (NOTE) The Xpert Xpress SARS-CoV-2/FLU/RSV assay is intended as an aid in  the diagnosis of influenza from Nasopharyngeal swab specimens and  should not be used as a sole basis for treatment. Nasal washings and  aspirates are unacceptable for Xpert Xpress SARS-CoV-2/FLU/RSV  testing.  Fact Sheet for Patients: PinkCheek.be  Fact Sheet for Healthcare Providers: GravelBags.it  This test is not yet approved or cleared by the [place] FDA and  has been authorized for detection and/or diagnosis of SARS-CoV-2 by  FDA under an Emergency Use Authorization (EUA). This EUA will remain  in effect (meaning this test can be used) for the duration of the  Covid-19 declaration under Section 564(b)(1) of the Act, 21  U.S.C. section 360bbb-3(b)(1), unless the authorization is  terminated or revoked. Performed at [facility], [address] [place], [place] [address]   Blood culture (routine x 2)     Status: None (Preliminary result)   Collection Time: 09/06/20  1:15 PM   Specimen: BLOOD  Result Value Ref Range Status   Specimen Description BLOOD LA  Final   Special Requests   Final    BOTTLES DRAWN AEROBIC AND ANAEROBIC Blood Culture adequate volume   Culture   Final    NO GROWTH 4 DAYS Performed at [facility], [address] [place], [place] [address]    Report Status PENDING  Incomplete  MRSA PCR Screening     Status: Abnormal   Collection Time: 09/06/20  5:10 PM   Specimen: Nasopharyngeal  Result Value Ref Range Status   MRSA by PCR POSITIVE (A) NEGATIVE Final    Comment:        The GeneXpert MRSA Assay (FDA approved for NASAL specimens only), is one component of a comprehensive MRSA colonization surveillance program. It is not intended to diagnose MRSA infection nor to guide or monitor treatment for MRSA infections. RESULT CALLED TO, READ BACK BY AND VERIFIED WITH: LINDSAY BLACK ON 09/06/20 AT 2130 QSD Performed at [facility], [address] [place], [place] [address]   Culture, sputum-assessment     Status: None   Collection Time: 09/06/20  7:27 PM   Specimen: Sputum  Result Value Ref Range Status   Specimen Description SPUTUM  Final   Special Requests NONE  Final   Sputum evaluation   Final    THIS SPECIMEN IS ACCEPTABLE FOR SPUTUM CULTURE Performed at [facility], [address] [place], [place] [address]    Report Status 09/06/2020 FINAL  Final   Culture, respiratory     Status: None   Collection Time: 09/06/20  7:27 PM  Specimen: SPU  Result Value Ref Range Status   Specimen Description   Final    SPUTUM Performed at [facility], [address] [place], [place] [address]    Special Requests   Final    NONE Reflexed from [id number] Performed at [facility], [address] [place], [place] [address]    Gram Stain   Final    ABUNDANT WBC PRESENT, PREDOMINANTLY PMN MODERATE GRAM POSITIVE COCCI FEW GRAM POSITIVE RODS RARE GRAM NEGATIVE RODS    Culture   Final    Consistent with normal respiratory flora. No Pseudomonas species isolated Performed at [place] [address] [place],  [address]    Report Status 09/08/2020 FINAL  Final      Imaging Studies   No results found.   Medications   Scheduled Meds: . amiodarone  400 mg Oral BID  . apixaban  5 mg Oral BID  . chlorhexidine  10 mL Mouth/Throat BID  . [START ON 09/11/2020] Chlorhexidine Gluconate Cloth  6 each Topical Q0600  . dexamethasone  4 mg Oral Daily  . docusate sodium  100 mg Oral Daily  . doxycycline  100 mg Oral Q12H  . feeding supplement  1 Container Oral TID BM  . fentaNYL  1 patch Transdermal Q72H  . gabapentin  300 mg Oral TID  . metoprolol tartrate  25 mg Oral BID  . multivitamin with minerals  1 tablet Oral Daily  . mupirocin ointment  1 application Nasal BID  . [START ON 09/11/2020] nicotine  21 mg Transdermal Daily  . sodium chloride flush  10-40 mL Intracatheter Q12H   Continuous Infusions: . sodium chloride Stopped (09/09/20 1904)  . cefTRIAXone (ROCEPHIN)  IV 2 g (09/10/20 1431)       LOS: 4 days    Time spent: 30 minutes with > 50% spent in coordination of care and direct patient contact.    Ezekiel Slocumb, DO [facility]  09/10/2020, 8:12 PM    If 7PM-7AM, please contact night-coverage. How to contact the [facility] Attending or Consulting provider [place] or covering provider  during after hours [place], for this patient?    1. Check the care team in [facility] and look for a) attending/consulting [facility] provider listed and b) the [facility] team listed 2. Log into [email or web address] and use [place] universal password to access. If you do not have the password, please contact the hospital operator. 3. Locate the [facility] provider you are looking for under [facility] and page to a number that you can be directly reached. 4. If you still have difficulty reaching the provider, please page the [facility] (Director on Call) for the Hospitalists listed on amion for assistance.

## 2020-09-10 NOTE — Consult Note (Signed)
Cardiology Consultation:   Patient ID: [id number] PARA; [id number]; 1956/08/15   Admit date: 09/06/2020 Date of Consult: 09/10/2020  Primary Care Provider: Pcp, No Primary Cardiologist: Fletcher Anon Primary Electrophysiologist:  None   Patient Profile:   Shannon Schroeder is a 64 y.o. 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 Arbutus Ped.  History of Present Illness:   Shannon Schroeder was seen virtually by Dr. Fletcher Anon in 03/2019 for preoperative cardiac evaluation for colonoscopy. Echo at that time showed an EF of 60-65%, normal LV diastolic function, normal RVSF with normal ventricular cavity size, PASP 32 mmHg, and mildly to moderately dilated left atrium.   Available stress test from [facility] in 05/2009 showed a small, mildly severe reversible defect involving the mid anteroseptal wall consistent with probable attenuation artifact and/or possible ischemia. EF > 65%.   She was diagnosed with maxillary sinus cancer in 07/2020 and has been undergoing chemoradiation therapy.   She was recently admitted to the hospital in 08/2020, after being found to have tachycardic at the cancer center and being transferred to the ED, with new onset Afib with RVR of uncertain chronicity. She converted to sinus rhythm on diltiazem gtt and was placed on metoprolol and Eliquis. Echo during the admission showed an EF of 60-65%, no RWMA, mild LVH, Gr2DD, normal PASP, mild MR. CTA chest was negative for PE. She was noted to have a mild troponin elevated felt to be related to demand ischemia. No significant coronary artery calcium was  noted on CTA of the chest.   She returned to the hospital on 11/5 from the cancer center with weakness and hypotension. She was noted to be hypotensive in the 90Z systolic and be in Afib with RVR upon arrival to the ED. She was also noted to have multifocal PNA and started on broad spectrum antibiotics. She was placed on a Cardizem gtt with improvement in her ventricular rates throughout her admission. On the night of 11/8, at 21:18, she developed SVT with rates into the 170s bpm with noted drop in BP to the 00P systolic. With hypotension, she was given an bolus of normal saline along with a bolus of IV amiodarone and placed on an amiodarone gtt. With this, she converted to sinus rhythm with heart rates in the 60s bpm at 21:57, without significant post termination pause. Since then, she has remained in sinus rhythm with heart rates in the upper 40s to 60s bpm with noted improvement in her BP. Amiodarone gtt has subsequently been discontinued. She remains on Eliquis and Lopressor 50 mg bid.   Labs: magnesium 2.3, potassium 3.2-->4.3, BUN/SCr 10/0.43, WBC 5.1, HGB 9.7, PLT 329, TSH normal, HS-Tn 27 with a delta troponin of 25, covid negative.  Currently, feels back to her baseline. She denies any chest pain, palpitations, dyspnea, dizziness, presyncope, or syncope. Tolerating Eliquis without symptoms concerning for bleeding.     Past Medical History:  Diagnosis Date  . 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   Marland Kitchen Anxiety   . Arthritis   . Bipolar disorder ([place])   . COPD (chronic  obstructive pulmonary disease) (HCC)    NO INHALERS  . Depression   . Dysrhythmia    H/O V TACH  . Head and neck cancer ([place]) 07/29/2020  . Headache    H/O MIGRAINES  . Hypertension   . Seizures ([place])    X1 AFTER FALL    Past Surgical History:  Procedure Laterality Date  . ABDOMINAL HYSTERECTOMY     partial  . BREAST SURGERY     biopsy  . CARDIAC CATHETERIZATION      X 2  . CARPAL TUNNEL RELEASE Right   . CARPECTOMY HAND Right   . CATARACT EXTRACTION W/PHACO Left 03/08/2018   Procedure: CATARACT EXTRACTION PHACO AND INTRAOCULAR LENS PLACEMENT (IOC);  Surgeon: Birder Robson, MD;  Location: [facility] ORS;  Service: Ophthalmology;  Laterality: Left;  [place] 00:55 AP% 13.5 CDE 7.52 Fluid pack lot # [id number]  . CATARACT EXTRACTION W/PHACO Right 04/05/2018   Procedure: CATARACT EXTRACTION PHACO AND INTRAOCULAR LENS PLACEMENT (IOC);  Surgeon: Birder Robson, MD;  Location: [facility] ORS;  Service: Ophthalmology;  Laterality: Right;  [place] 00:36 AP% 15.9 CDE 5.72 Fluid pack lot # [id number]  . CEREBRAL ANEURYSM REPAIR    . CHOLECYSTECTOMY    . COLONOSCOPY WITH PROPOFOL N/A 04/26/2019   Procedure: COLONOSCOPY WITH PROPOFOL;  Surgeon: Jonathon Bellows, MD;  Location: [facility] ENDOSCOPY;  Service: Gastroenterology;  Laterality: N/A;  . PORTACATH PLACEMENT Left 08/05/2020   Procedure: INSERTION PORT-A-CATH;  Surgeon: Herbert Pun, MD;  Location: [facility] ORS;  Service: General;  Laterality: Left;  . TUBAL LIGATION       Home Meds: Prior to Admission medications   Medication Sig Start Date End Date Taking? Authorizing Provider  apixaban (ELIQUIS) 5 MG TABS tablet Take 1 tablet (5 mg total) by mouth 2 (two) times daily. 08/10/20  Yes Gollan, Kathlene November, MD  chlorhexidine (PERIDEX) 0.12 % solution Use as directed 10 mLs in the mouth or throat 2 (two) times daily. Swish and spit 08/13/20  Yes Earlie Server, MD  dexamethasone (DECADRON) 4 MG tablet Take 1 tablet (4 mg total) by mouth daily. 08/19/20  Yes Earlie Server, MD  gabapentin (NEURONTIN) 300 MG capsule Take 1 capsule (300 mg total) by mouth 3 (three) times daily. 07/19/20  Yes Borders, Kirt Boys, NP  metoprolol tartrate (LOPRESSOR) 50 MG tablet Take 1 tablet (50 mg total) by mouth 2 (two) times daily. 08/10/20  Yes Fritzi Mandes, MD  Multiple Vitamins-Minerals (CENTRUM SILVER PO) Take 1 tablet by mouth daily. Gummie   Yes [provider]  oxyCODONE-acetaminophen (PERCOCET) 5-325 MG tablet Take 1 tablet by mouth every 4 (four) hours as needed for severe pain. 08/12/20 08/12/21 Yes Earlie Server, MD  acetaminophen (TYLENOL 8 HOUR ARTHRITIS PAIN) 650 MG CR tablet Take 1,300 mg by mouth daily as needed (Pain).  Patient not taking: Reported on 09/02/2020    [provider]  acyclovir (ZOVIRAX) 400 MG tablet Take 1 tablet (400 mg total) by mouth 2 (two) times daily. 09/02/20   Earlie Server, MD  b complex vitamins capsule Take 1 capsule by mouth daily. Gummie Patient not taking: Reported on 09/06/2020    [provider]  docusate sodium (COLACE) 100 MG capsule Take 1 capsule (100 mg total) by mouth daily. 08/26/20   Earlie Server, MD  feeding supplement, ENSURE ENLIVE, (ENSURE ENLIVE) LIQD Take 237 mLs by mouth 3 (three) times daily between meals. 08/10/20   Fritzi Mandes, MD  fentaNYL (DURAGESIC) 25 MCG/HR Place 1 patch onto the skin every 3 (  three) days. 08/19/20   Earlie Server, MD  Ginkgo Biloba (GINKOBA PO) Take 1 tablet by mouth daily.  Patient not taking: Reported on 09/02/2020    [provider]  hydrALAZINE (APRESOLINE) 25 MG tablet Take 1 tablet (25 mg total) by mouth 2 (two) times daily. Patient not taking: Reported on 09/02/2020 08/10/20   Fritzi Mandes, MD  ibuprofen (ADVIL) 400 MG tablet Take 400 mg by mouth every 6 (six) hours as needed for mild pain or moderate pain. Liquid Gel Patient not taking: Reported on 09/06/2020    [provider]  lidocaine-prilocaine (EMLA) cream Apply to affected area once Patient taking differently: Apply 1 application topically as needed [facility]). Apply to affected area once 07/29/20   Earlie Server, MD  lisinopril (ZESTRIL) 40 MG tablet Take 1 tablet (40 mg total) by mouth daily. 08/11/20   Fritzi Mandes, MD  Melatonin 10 MG TABS Take 10 mg by mouth at bedtime as needed (sleep).     [provider]  nicotine (NICODERM CQ - DOSED IN MG/24 HOURS) 21 mg/24hr patch Place 1 patch (21 mg  total) onto the skin daily. Patient not taking: Reported on 08/19/2020 08/11/20   Fritzi Mandes, MD  polyethylene glycol powder (GLYCOLAX/MIRALAX) 17 GM/SCOOP powder Take 1 Container by mouth daily as needed.    [provider]  potassium chloride SA (KLOR-CON) 20 MEQ tablet Take 1 tablet (20 mEq total) by mouth daily. 09/02/20   Earlie Server, MD  prochlorperazine (COMPAZINE) 10 MG tablet Take 1 tablet (10 mg total) by mouth every 6 (six) hours as needed (Nausea or vomiting). 07/29/20   Earlie Server, MD    Inpatient Medications: Scheduled Meds: . apixaban  5 mg Oral BID  . chlorhexidine  10 mL Mouth/Throat BID  . Chlorhexidine Gluconate Cloth  6 each Topical Daily  . dexamethasone  4 mg Oral Daily  . docusate sodium  100 mg Oral Daily  . doxycycline  100 mg Oral Q12H  . feeding supplement  1 Container Oral TID BM  . fentaNYL  1 patch Transdermal Q72H  . gabapentin  300 mg Oral TID  . metoprolol tartrate  50 mg Oral BID  . multivitamin with minerals  1 tablet Oral Daily  . mupirocin ointment  1 application Nasal BID  . [START ON 09/11/2020] nicotine  21 mg Transdermal Daily  . sodium chloride flush  10-40 mL Intracatheter Q12H   Continuous Infusions: . sodium chloride Stopped (09/09/20 1904)  . cefTRIAXone (ROCEPHIN)  IV Stopped (09/09/20 1611)   PRN Meds: sodium chloride, acetaminophen, albuterol, dextromethorphan-guaiFENesin, lidocaine-prilocaine, melatonin, ondansetron (ZOFRAN) IV, oxyCODONE-acetaminophen, polyethylene glycol powder, prochlorperazine, sodium chloride flush  Allergies:   Allergies  Allergen Reactions  . Aspirin Swelling and Other (See Comments)  . Belladonna Alkaloids Rash  . Fluoxetine Hcl Other (See Comments) and Rash  . Naproxen Swelling, Rash and Other (See Comments)  . Phenobarbital Rash and Other (See Comments)  . Prozac [Fluoxetine Hcl] Rash    Social History:   Social History   Socioeconomic History  . Marital status: Divorced    Spouse name: Not  on file  . Number of children: Not on file  . Years of education: 36  . Highest education level: Bachelor's degree (e.g., BA, AB, BS)  Occupational History  . Occupation: farmer  Tobacco Use  . Smoking status: Current Every Day Smoker    Packs/day: 1.00    Years: 50.00    Pack years: 50.00    Types: Cigarettes  .  Smokeless tobacco: Never Used  Vaping Use  . Vaping Use: Never used  Substance and Sexual Activity  . Alcohol use: No  . Drug use: No  . Sexual activity: Not Currently  Other Topics Concern  . Not on file  Social History Narrative   Pt lives at [facility].    Social Determinants of Health   Financial Resource Strain:   . Difficulty of Paying Living Expenses: Not on file  Food Insecurity:   . Worried About Charity fundraiser in the Last Year: Not on file  . Ran Out of Food in the Last Year: Not on file  Transportation Needs:   . Lack of Transportation (Medical): Not on file  . Lack of Transportation (Non-Medical): Not on file  Physical Activity:   . Days of Exercise per Week: Not on file  . Minutes of Exercise per Session: Not on file  Stress:   . Feeling of Stress : Not on file  Social Connections:   . Frequency of Communication with Friends and Family: Not on file  . Frequency of Social Gatherings with Friends and Family: Not on file  . Attends Religious Services: Not on file  . Active Member of Clubs or Organizations: Not on file  . Attends Archivist Meetings: Not on file  . Marital Status: Not on file  Intimate Partner Violence:   . Fear of Current or Ex-Partner: Not on file  . Emotionally Abused: Not on file  . Physically Abused: Not on file  . Sexually Abused: Not on file     Family History:   Family History  Problem Relation Age of Onset  . Hypertension Mother   . Heart failure Mother   . Hypertension Brother   . Stroke Brother   . Hypertension Son   . Emphysema Maternal Aunt   . Hypertension Paternal Aunt   .  Hypertension Paternal Uncle   . Hypertension Maternal Grandmother   . Hypertension Paternal Grandmother     ROS:  Review of Systems  Constitutional: Positive for malaise/fatigue. Negative for chills, diaphoresis, fever and weight loss.  HENT: Negative for congestion.   Eyes: Negative for discharge and redness.  Respiratory: Negative for cough, sputum production, shortness of breath and wheezing.   Cardiovascular: Positive for palpitations. Negative for chest pain, orthopnea, claudication, leg swelling and PND.  Gastrointestinal: Negative for abdominal pain, blood in stool, heartburn, melena, nausea and vomiting.  Musculoskeletal: Negative for falls and myalgias.  Skin: Negative for rash.  Neurological: Positive for dizziness and weakness. Negative for tingling, tremors, sensory change, speech change, focal weakness and loss of consciousness.  Endo/Heme/Allergies: Does not bruise/bleed easily.  Psychiatric/Behavioral: Negative for substance abuse. The patient is not nervous/anxious.   All other systems reviewed and are negative.     Physical Exam/Data:   Vitals:   09/10/20 0400 09/10/20 0500 09/10/20 0600 09/10/20 0820  BP: (!) 120/58 112/72 113/71 137/75  Pulse:      Resp: 19 12 10 17   Temp:    98.1 F (36.7 C)  TempSrc:    Oral  SpO2:    99%  Weight:      Height:        Intake/Output Summary (Last 24 hours) at 09/10/2020 1121 Last data filed at 09/10/2020 0950 Gross per 24 hour  Intake 1650.37 ml  Output 801 ml  Net 849.37 ml   Filed Weights   09/07/20 1658 09/09/20 0522 09/10/20 0346  Weight: 61.2 kg 62 kg 59.8 kg  Body mass index is 21.29 kg/m.   Physical Exam: General: Well developed, well nourished, in no acute distress. Head: Normocephalic, atraumatic, sclera non-icteric, no xanthomas, nares without discharge.  Neck: Negative for carotid bruits. JVD not elevated. Lungs: Clear bilaterally to auscultation without wheezes, rales, or rhonchi. Breathing is  unlabored. Heart: RRR with S1 S2. No murmurs, rubs, or gallops appreciated. Abdomen: Soft, non-tender, non-distended with normoactive bowel sounds. No hepatomegaly. No rebound/guarding. No obvious abdominal masses. Msk:  Strength and tone appear normal for age. Extremities: No clubbing or cyanosis. No edema. Distal pedal pulses are 2+ and equal bilaterally. Neuro: Alert and oriented X 3. No facial asymmetry. No focal deficit. Moves all extremities spontaneously. Psych:  Responds to questions appropriately with a normal affect.   EKG:  The EKG was personally reviewed and demonstrates: 11/5 - Afib with RVR, 165 bpm, baseline wandering, diffuse nonspecific st/t changes. 11/8 - SVT, 170 bpm, nonspecific st/t changes. 11/8 - NSR, 63 bpm, nonspecific anterior st/t changes Telemetry:  Telemetry was personally reviewed and demonstrates: on 11/8 at 21:18, she developed SVT with rates into the 170s bpm, which lasted until 21:57, without significant post termination pause  Weights: Filed Weights   09/07/20 1658 09/09/20 0522 09/10/20 0346  Weight: 61.2 kg 62 kg 59.8 kg    Relevant CV Studies:  2D echo 08/09/2020: 1. Left ventricular ejection fraction, by estimation, is 60 to 65%. The  left ventricle has normal function. The left ventricle has no regional  wall motion abnormalities. There is mild left ventricular hypertrophy.  Left ventricular diastolic parameters  are consistent with Grade II diastolic dysfunction (pseudonormalization).  2. Right ventricular systolic function is normal. The right ventricular  size is normal. There is normal pulmonary artery systolic pressure. The  estimated right ventricular systolic pressure is 50.0 mmHg.  3. The mitral valve is normal in structure. Mild mitral valve  regurgitation.  __________  2D echo 04/2019: 1. The left ventricle has normal systolic function with an ejection  fraction of 60-65%. The cavity size was normal. Left ventricular diastolic    parameters were normal.  2. The right ventricle has normal systolic function. The cavity was  normal. There is no increase in right ventricular wall thickness. Mildly  elevated RVSP, 32 mm Hg  3. Left atrial size was mild-moderately dilated. __________  Nuclear stress test 05/2009: Impression:    - There is a small, mildly severe reversible defect  involving the mid anteroseptal wall. This is consistent with  probabl~e attenuation artifact and/or possibly ischemia.  - Global systolic function and thickening is normal. The  ejection fraction is greater than 65%.  - Breast attenuation is noted  - Sensitivity and specificity of this test are reduced by the  noted attenuation  - In the future, for larger patients, consider ordering PET  rubidium study   Laboratory Data:  Chemistry Recent Labs  Lab 09/09/20 0453 09/09/20 2244 09/10/20 0353  NA 133* 133* 134*  K 3.9 3.2* 4.3  CL 100 97* 100  CO2 28 26 29   GLUCOSE 85 94 82  BUN 14 12 10   CREATININE 0.30* 0.44 0.43*  CALCIUM 7.6* 7.7* 8.0*  GFRNONAA >60 >60 >60  ANIONGAP 5 10 5     Recent Labs  Lab 09/04/20 1249 09/06/20 1141 09/06/20 1243  PROT 5.8* 5.7* 5.6*  ALBUMIN 2.7* 2.6* 2.6*  AST 28 20 19   ALT 17 17 16   ALKPHOS 90 86 85  BILITOT 0.5 0.5 0.7   Hematology Recent Labs  Lab 09/09/20 0453 09/09/20 0453 09/09/20 0938 09/09/20 1348 09/09/20 2244 09/10/20 0353  WBC 4.0  --   --   --  5.1 5.1  RBC 2.70*  --  2.84*  --  2.97* 2.96*  HGB 8.9*   < >  --  9.8* 10.0* 9.7*  HCT 26.4*   < >  --  29.2* 29.5* 29.6*  MCV 97.8  --   --   --  99.3 100.0  MCH 33.0  --   --   --  33.7 32.8  MCHC 33.7  --   --   --  33.9 32.8  RDW 13.2  --   --   --  13.2 13.4  PLT 289  --   --   --  316 329   < > = values in this interval not displayed.   Cardiac EnzymesNo results for input(s): TROPONINI in the last 168 hours. No results for input(s): TROPIPOC in the last 168 hours.  BNPNo results for input(s): BNP, PROBNP  in the last 168 hours.  DDimer No results for input(s): DDIMER in the last 168 hours.  Radiology/Studies:  CT Chest W Contrast  Result Date: 09/06/2020 IMPRESSION: Patchy multifocal infiltrate worst in the right upper lobe posteriorly and laterally consistent with that seen on recent plain film examination. This is new from the prior CT consistent with multifocal pneumonia. Followup PA and lateral chest X-ray is recommended in 3-4 weeks following trial of antibiotic therapy to ensure resolution. Fatty liver. No other focal abnormality is noted. Aortic Atherosclerosis (ICD10-I70.0). Electronically Signed   By: Inez Catalina M.D.   On: 09/06/2020 16:12   DG Chest Portable 1 View  Result Date: 09/06/2020 IMPRESSION: 1. PowerPort catheter with tip over superior vena cava. 2. Multifocal right lung infiltrates noted. Tiny cavitary infiltrate right upper lung cannot be excluded. Electronically Signed   By: Marcello Moores  Register   On: 09/06/2020 13:26    Assessment and Plan:   1. Paroxysmal SVT with associated hypotension: -Likely in the setting of multifocal PNA and recent hypokalemia, which has been repleted  -Converted to sinus rhythm on IV amiodarone with subsequent improvement in HR and BP -She was quite symptomatic with SVT and has been symptomatic with Afib twice recently, leading to ED visits and hospital admissions  -In an effort to decrease her atrial arrhythmia burden we will start her on amiodarone 400 mg bid x 1 week, followed by 200 mg bid x 1 week, then 200 mg daily thereafter -In sinus, her rates are bradycardic, and in this setting we will need to decrease her metoprolol initially to 25 mg bid, though may need to reduce further  -TSH normal -Baseline LFT normal -Potassium and magnesium at goal  2. Persistent Afib with RVR: -Presented in Afib with RVR, likely in the setting of severe sepsis with multifocal PNA -Converted to sinus rhythm on Cardizem gtt -Add amiodarone and decrease  metoprolol as above -CHADS2VASc 3 -Eliquis with close monitoring of her anemia  3. Elevated HS-Tn: -Not consistent with ACS -Supply demand ischemia in the context of Afib with RVR, severe sepsis with PNA and hypotension, and anemia -Consider outpatient ischemic testing once her acute illness has improved   4. Reported history of VT: -Details of this are not clear -Echo with preserved LVSF -Beta blocker and amiodarone as above  5. Hypokalemia/hypomagnesemia: -Repleted   6. Severe sepsis with multifocal PNA: -Management per IM  7. HTN: -BP soft upon admission and with SVT -Improved -Continue  Lopressor as above -With decreased metoprolol dose, may need to compensate with another antihypertensive     For questions or updates, please contact [place] Please consult [email or web address] for contact info under Cardiology/STEMI.   Signed, Christell Faith, PA-C [facility] Pager: [phone] 09/10/2020, 11:21 AM

## 2020-09-10 NOTE — Progress Notes (Signed)
[employer] Nurse called with heart rate in 170's. Bedside patient with SVT and hypotensive on monitor. Normal saline bolus initiated and amio dtt after bolus started.  Patient converted to sinus rhythm with rate 60s shortly after start.  Blood pressure greatly improved and patient endorsed feeling better

## 2020-09-10 NOTE — TOC Progression Note (Signed)
Transition of Care [facility]) - Progression Note    Patient Details  Name: Shannon Schroeder MRN: [id number] Date of Birth: June 12, 1956  Transition of Care [facility]) CM/SW [place], LCSW Phone Number: 09/10/2020, 2:24 PM  Clinical Narrative:   Patient is refusing [place] services. CSW will continue to follow.    Expected Discharge Plan: [place] Barriers to Discharge: Continued Medical Work up  Expected Discharge Plan and Services Expected Discharge Plan: [place] In-house Referral: Clinical Social Work   Post Acute Care Choice: NA (patient is refusing HH at this time) Living arrangements for the past 2 months: Single Family Home                           HH Arranged: NA (patient is refusing HH at this time)           Social Determinants of Health (SDOH) Interventions    Readmission Risk Interventions Readmission Risk Prevention Plan 09/08/2020  Transportation Screening Complete  PCP or Specialist Appt within 3-5 Days Complete  HRI or [place] Complete  Social Work Consult for [place] Planning/Counseling Complete  Palliative Care Screening Not Applicable  Medication Review Press photographer) Complete  Some recent data might be hidden

## 2020-09-10 NOTE — TOC Progression Note (Addendum)
Transition of Care [facility]) - Progression Note    Patient Details  Name: Shannon Schroeder MRN: [id number] Date of Birth: August 06, 1956  Transition of Care [facility]) CM/SW Contact  Eileen Stanford, LCSW Phone Number: 09/10/2020, 9:38 AM  Clinical Narrative:  Pt lives at home with her roommate. PT is recommending HH however, when CSW spoke with pt about it pt declines stating she has a roommate that will assist her. Pt states "I don't need that."  Pt also declined assistance with finding a PCP, stating "I have my cardiologist." CSW expressed the importance of hospital follow up, pt again decline any assistance with this.           Expected Discharge Plan and Services                                                  Social Determinants of Health (SDOH) Interventions    Readmission Risk Interventions Readmission Risk Prevention Plan 09/08/2020  Transportation Screening Complete  PCP or Specialist Appt within 3-5 Days Complete  HRI or [place] Complete  Social Work Consult for [place] Planning/Counseling Complete  Palliative Care Screening Not Applicable  Medication Review Press photographer) Complete  Some recent data might be hidden

## 2020-09-10 NOTE — Progress Notes (Signed)
Mobility Specialist - Progress Note   09/10/20 1100  Mobility  Activity Ambulated in hall  Level of Assistance Standby assist, set-up cues, supervision of patient - no hands on  Assistive Device Front wheel walker  Distance Ambulated (ft) 300 ft  Mobility Response Tolerated well  Mobility performed by Mobility specialist  $Mobility charge 1 Mobility    Pre-mobility: 60 HR, 99% SpO2 During mobility: 65 HR, 97% SpO2 Post-mobility: 57 HR, 98% SpO2   Pt was lying in bed upon arrival. Pt agreed to session. Pt c/o being tired, but denied any pain or nausea. Pt repositioned to EOB with no physical assistance and stood to RW SBA. CGA donned for safety precautions. Pt ambulated 300' total in hallway. After 140', pt agreed to trial ambulation without AD as she stated she had not used a RW prior to admission. Pt ambulated 38' without AD, using rails for balance. No LOB noted, however, pt presents with unsteady/shaky gait. Pt utilized RW for remainder of session. Pt c/o feeling weak in LE during last lap of ambulation with 1 standing break taken. O2 > high 90s throughout session with no c/o SOB. Upon return to room, pt c/o feeling now "physically tired" and rated her RPE this session a 7/10. Overall, pt tolerated well. Pt was left in bed with all needs in reach.    Kathee Delton Mobility Specialist 09/10/20, 11:40 AM

## 2020-09-11 ENCOUNTER — Ambulatory Visit: Payer: Medicaid Other

## 2020-09-11 DIAGNOSIS — Z720 Tobacco use: Secondary | ICD-10-CM

## 2020-09-11 DIAGNOSIS — R778 Other specified abnormalities of plasma proteins: Secondary | ICD-10-CM

## 2020-09-11 DIAGNOSIS — R1311 Dysphagia, oral phase: Secondary | ICD-10-CM

## 2020-09-11 DIAGNOSIS — I471 Supraventricular tachycardia: Secondary | ICD-10-CM

## 2020-09-11 LAB — CULTURE, BLOOD (ROUTINE X 2)
Culture: NO GROWTH
Culture: NO GROWTH
Special Requests: ADEQUATE
Special Requests: ADEQUATE

## 2020-09-11 LAB — CBC
HCT: 27.9 % — ABNORMAL LOW (ref 36.0–46.0)
Hemoglobin: 9.5 g/dL — ABNORMAL LOW (ref 12.0–15.0)
MCH: 33.6 pg (ref 26.0–34.0)
MCHC: 34.1 g/dL (ref 30.0–36.0)
MCV: 98.6 fL (ref 80.0–100.0)
Platelets: 271 10*3/uL (ref 150–400)
RBC: 2.83 MIL/uL — ABNORMAL LOW (ref 3.87–5.11)
RDW: 13.5 % (ref 11.5–15.5)
WBC: 3.2 10*3/uL — ABNORMAL LOW (ref 4.0–10.5)
nRBC: 0 % (ref 0.0–0.2)

## 2020-09-11 LAB — BASIC METABOLIC PANEL
Anion gap: 5 (ref 5–15)
BUN: 7 mg/dL — ABNORMAL LOW (ref 8–23)
CO2: 28 mmol/L (ref 22–32)
Calcium: 8 mg/dL — ABNORMAL LOW (ref 8.9–10.3)
Chloride: 99 mmol/L (ref 98–111)
Creatinine, Ser: <0.3 mg/dL — ABNORMAL LOW (ref 0.44–1.00)
Glucose, Bld: 89 mg/dL (ref 70–99)
Potassium: 3.5 mmol/L (ref 3.5–5.1)
Sodium: 132 mmol/L — ABNORMAL LOW (ref 135–145)

## 2020-09-11 LAB — MAGNESIUM: Magnesium: 1.6 mg/dL — ABNORMAL LOW (ref 1.7–2.4)

## 2020-09-11 MED ORDER — HEPARIN SOD (PORK) LOCK FLUSH 100 UNIT/ML IV SOLN
500.0000 [IU] | INTRAVENOUS | Status: AC | PRN
  Administered 2020-09-11: 500 [IU]
  Filled 2020-09-11: qty 5

## 2020-09-11 MED ORDER — AMIODARONE HCL 200 MG PO TABS: ORAL_TABLET | ORAL | 0 refills | Status: DC

## 2020-09-11 MED ORDER — MAGNESIUM SULFATE 2 GM/50ML IV SOLN
2.0000 g | Freq: Once | INTRAVENOUS | Status: AC
  Administered 2020-09-11: 2 g via INTRAVENOUS
  Filled 2020-09-11: qty 50

## 2020-09-11 MED ORDER — POTASSIUM CHLORIDE CRYS ER 20 MEQ PO TBCR: 40.0000 meq | EXTENDED_RELEASE_TABLET | Freq: Once | ORAL | Status: DC

## 2020-09-11 MED ORDER — POTASSIUM CHLORIDE 20 MEQ PO PACK
60.0000 meq | PACK | Freq: Once | ORAL | Status: DC
  Filled 2020-09-11: qty 3

## 2020-09-11 MED ORDER — METOPROLOL TARTRATE 25 MG PO TABS
25.0000 mg | ORAL_TABLET | Freq: Two times a day (BID) | ORAL | 1 refills | Status: DC
Start: 2020-09-11 — End: 2020-09-20

## 2020-09-11 MED ORDER — POLYETHYLENE GLYCOL 3350 17 GM/SCOOP PO POWD: 17.0000 g | Freq: Every day | ORAL | 0 refills | Status: AC | PRN

## 2020-09-11 NOTE — Progress Notes (Signed)
Hematology/Oncology Progress Note [facility] [phone] [phone]  Patient Care Team: Pcp, No as PCP - General Rockey Situ Kathlene November, MD as PCP - Cardiology (Cardiology) Earlie Server, MD as Consulting Physician (Oncology)   Name of the patient: Shannon Schroeder  [id number]  10-Jul-1956  Date of visit: 09/11/20   INTERVAL HISTORY-  Patient reports feeling better today.  She is eating lunch.  No new complaints.  No acute overnight event. Facial pain is controlled.   Review of systems- Review of Systems  Constitutional: Positive for fatigue. Negative for appetite change, chills and fever.  HENT:   Negative for hearing loss and voice change.   Eyes: Negative for eye problems.  Respiratory: Positive for cough. Negative for chest tightness and shortness of breath.   Cardiovascular: Negative for chest pain.  Gastrointestinal: Negative for abdominal distention, abdominal pain and blood in stool.  Endocrine: Negative for hot flashes.  Genitourinary: Negative for difficulty urinating and frequency.   Musculoskeletal: Negative for arthralgias.  Skin: Negative for itching and rash.  Neurological: Negative for extremity weakness.  Hematological: Negative for adenopathy.  Psychiatric/Behavioral: Negative for confusion.    Allergies  Allergen Reactions  . Aspirin Swelling and Other (See Comments)  . Belladonna Alkaloids Rash  . Fluoxetine Hcl Other (See Comments) and Rash  . Naproxen Swelling, Rash and Other (See Comments)  . Phenobarbital Rash and Other (See Comments)  . Prozac [Fluoxetine Hcl] Rash    Patient Active Problem List   Diagnosis Date Noted  . Hypomagnesemia   . Dysphagia   . Severe sepsis ([place]) 09/06/2020  . Multifocal pneumonia 09/06/2020  . Drug-induced constipation 08/26/2020  . Hypotension 08/12/2020  . Encounter for antineoplastic chemotherapy 08/08/2020  . Atrial fibrillation with RVR ([place]) 08/08/2020  . Elevated  troponin 08/08/2020  . Leukocytosis 08/08/2020  . Hypokalemia 08/08/2020  . SIRS (systemic inflammatory response syndrome) ([place]) 08/08/2020  . Goals of care, counseling/discussion 08/06/2020  . Head and neck cancer ([place]) 07/29/2020  . 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 Weight loss 07/29/2020  . Neoplasm related pain 07/29/2020  . Abscess of upper gum 09/12/2019  . History of atrial fibrillation 03/22/2019  . Bipolar affective disorder, current episode mixed ([place]) 06/29/2018  . TIA (transient ischemic attack) 06/15/2018  . Tobacco abuse 06/02/2018  . COPD (chronic obstructive pulmonary disease) ([place]) 04/21/2018  . URI (upper respiratory infection) 03/31/2018  . Hypertension 03/31/2018  . Tachycardia 02/02/2018  . Rash of body 02/02/2018  . Hyponatremia 02/02/2018     Past Medical History:  Diagnosis Date  . 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   Marland Kitchen Anxiety   . Arthritis   . Bipolar disorder ([place])   . COPD (chronic obstructive pulmonary disease) ([place])    NO INHALERS  . Depression   . Dysrhythmia    H/O V TACH  . Head and neck cancer ([place]) 07/29/2020  . Headache    H/O MIGRAINES  . Hypertension   . Seizures ([place])    X1 AFTER FALL     Past Surgical History:  Procedure Laterality Date  . ABDOMINAL HYSTERECTOMY     partial  . BREAST SURGERY     biopsy  . CARDIAC CATHETERIZATION     X 2  . CARPAL TUNNEL RELEASE Right   . CARPECTOMY HAND Right   . CATARACT EXTRACTION W/PHACO Left 03/08/2018   Procedure: CATARACT EXTRACTION PHACO AND INTRAOCULAR LENS PLACEMENT (IOC);  Surgeon: Birder Robson,  MD;  Location: [facility] ORS;  Service: Ophthalmology;  Laterality: Left;  [place] 00:55 AP% 13.5 CDE 7.52 Fluid pack lot # [id number]  . CATARACT EXTRACTION W/PHACO Right 04/05/2018   Procedure: CATARACT EXTRACTION PHACO AND INTRAOCULAR LENS PLACEMENT (IOC);  Surgeon: Birder Robson, MD;  Location: [facility] ORS;  Service:  Ophthalmology;  Laterality: Right;  [place] 00:36 AP% 15.9 CDE 5.72 Fluid pack lot # [id number]  . CEREBRAL ANEURYSM REPAIR    . CHOLECYSTECTOMY    . COLONOSCOPY WITH PROPOFOL N/A 04/26/2019   Procedure: COLONOSCOPY WITH PROPOFOL;  Surgeon: Jonathon Bellows, MD;  Location: [facility];  Service: Gastroenterology;  Laterality: N/A;  . PORTACATH PLACEMENT Left 08/05/2020   Procedure: INSERTION PORT-A-CATH;  Surgeon: Herbert Pun, MD;  Location: [facility] ORS;  Service: General;  Laterality: Left;  . TUBAL LIGATION      Social History   Socioeconomic History  . Marital status: Divorced    Spouse name: Not on file  . Number of children: Not on file  . Years of education: 65  . Highest education level: Bachelor's degree (e.g., BA, AB, BS)  Occupational History  . Occupation: farmer  Tobacco Use  . Smoking status: Current Every Day Smoker    Packs/day: 1.00    Years: 50.00    Pack years: 50.00    Types: Cigarettes  . Smokeless tobacco: Never Used  Vaping Use  . Vaping Use: Never used  Substance and Sexual Activity  . Alcohol use: No  . Drug use: No  . Sexual activity: Not Currently  Other Topics Concern  . Not on file  Social History Narrative   Pt lives at [facility].    Social Determinants of Health   Financial Resource Strain:   . Difficulty of Paying Living Expenses: Not on file  Food Insecurity:   . Worried About Charity fundraiser in the Last Year: Not on file  . Ran Out of Food in the Last Year: Not on file  Transportation Needs:   . Lack of Transportation (Medical): Not on file  . Lack of Transportation (Non-Medical): Not on file  Physical Activity:   . Days of Exercise per Week: Not on file  . Minutes of Exercise per Session: Not on file  Stress:   . Feeling of Stress : Not on file  Social Connections:   . Frequency of Communication with Friends and Family: Not on file  . Frequency of Social Gatherings with Friends and Family: Not on file  . Attends  Religious Services: Not on file  . Active Member of Clubs or Organizations: Not on file  . Attends Archivist Meetings: Not on file  . Marital Status: Not on file  Intimate Partner Violence:   . Fear of Current or Ex-Partner: Not on file  . Emotionally Abused: Not on file  . Physically Abused: Not on file  . Sexually Abused: Not on file     Family History  Problem Relation Age of Onset  . Hypertension Mother   . Heart failure Mother   . Hypertension Brother   . Stroke Brother   . Hypertension Son   . Emphysema Maternal Aunt   . Hypertension Paternal Aunt   . Hypertension Paternal Uncle   . Hypertension Maternal Grandmother   . Hypertension Paternal Grandmother      Current Facility-Administered Medications:  .  0.9 %  sodium chloride infusion, , Intravenous, PRN, Ezekiel Slocumb, DO, Last Rate: 10 mL/hr at 09/11/20 0909, 1,000 mL at 09/11/20  0909 .  acetaminophen (TYLENOL) tablet 650 mg, 650 mg, Oral, Q6H PRN, Ivor Costa, MD .  albuterol (VENTOLIN HFA) 108 (90 Base) MCG/ACT inhaler 2 puff, 2 puff, Inhalation, Q4H PRN, Ivor Costa, MD .  amiodarone (PACERONE) tablet 400 mg, 400 mg, Oral, BID, Nicole Kindred A, DO, 400 mg at 09/11/20 1019 .  apixaban (ELIQUIS) tablet 5 mg, 5 mg, Oral, BID, Ivor Costa, MD, 5 mg at 09/11/20 1019 .  cefTRIAXone (ROCEPHIN) 2 g in sodium chloride 0.9 % 100 mL IVPB, 2 g, Intravenous, Q24H, Griffith, Kelly A, DO, Last Rate: 200 mL/hr at 09/10/20 1431, 2 g at 09/10/20 1431 .  chlorhexidine (PERIDEX) 0.12 % solution 10 mL, 10 mL, Mouth/Throat, BID, Ivor Costa, MD, 10 mL at 09/11/20 1019 .  Chlorhexidine Gluconate Cloth 2 % PADS 6 each, 6 each, Topical, Q0600, Nicole Kindred A, DO .  dexamethasone (DECADRON) tablet 4 mg, 4 mg, Oral, Daily, Ivor Costa, MD, 4 mg at 09/10/20 0904 .  dextromethorphan-guaiFENesin ([place] DM) 30-600 MG per 12 hr tablet 1 tablet, 1 tablet, Oral, BID PRN, Ivor Costa, MD .  docusate sodium (COLACE) capsule 100 mg, 100  mg, Oral, Daily, Ivor Costa, MD, 100 mg at 09/11/20 1018 .  doxycycline (VIBRA-TABS) tablet 100 mg, 100 mg, Oral, Q12H, Nicole Kindred A, DO, 100 mg at 09/11/20 1018 .  feeding supplement (BOOST / RESOURCE BREEZE) liquid 1 Container, 1 Container, Oral, TID BM, Ezekiel Slocumb, DO, 1 Container at 09/09/20 2117 .  fentaNYL (DURAGESIC) 25 MCG/HR 1 patch, 1 patch, Transdermal, Q72H, Ivor Costa, MD, 1 patch at 09/09/20 2115 .  gabapentin (NEURONTIN) capsule 300 mg, 300 mg, Oral, TID, Ivor Costa, MD, 300 mg at 09/11/20 1019 .  heparin lock flush 100 unit/mL, 500 Units, Intracatheter, Prior to discharge, Wendee Beavers T, MD .  lidocaine-prilocaine (EMLA) cream 1 application, 1 application, Topical, PRN, Ivor Costa, MD .  melatonin tablet 10 mg, 10 mg, Oral, QHS PRN, Ivor Costa, MD .  metoprolol tartrate (LOPRESSOR) tablet 25 mg, 25 mg, Oral, BID, Dunn, Ryan M, PA-C, 25 mg at 09/11/20 1018 .  multivitamin with minerals tablet 1 tablet, 1 tablet, Oral, Daily, Ivor Costa, MD .  mupirocin ointment (BACTROBAN) 2 % 1 application, 1 application, Nasal, BID, Ezekiel Slocumb, DO, 1 application at 42/35/36 0905 .  nicotine (NICODERM CQ - dosed in mg/24 hours) patch 21 mg, 21 mg, Transdermal, Daily, Nicole Kindred A, DO, 21 mg at 09/11/20 1019 .  ondansetron (ZOFRAN) injection 4 mg, 4 mg, Intravenous, Q8H PRN, Ivor Costa, MD .  oxyCODONE-acetaminophen (PERCOCET/ROXICET) 5-325 MG per tablet 1 tablet, 1 tablet, Oral, Q4H PRN, Ivor Costa, MD .  polyethylene glycol powder (GLYCOLAX/MIRALAX) container 255 g, 1 Container, Oral, Daily PRN, Ivor Costa, MD .  potassium chloride (KLOR-CON) packet 60 mEq, 60 mEq, Oral, Once, Gonfa, Taye T, MD .  prochlorperazine (COMPAZINE) tablet 10 mg, 10 mg, Oral, Q6H PRN, Nicole Kindred A, DO .  sodium chloride flush (NS) 0.9 % injection 10-40 mL, 10-40 mL, Intracatheter, Q12H, Nicole Kindred A, DO, 10 mL at 09/11/20 0012 .  sodium chloride flush (NS) 0.9 % injection 10-40 mL, 10-40  mL, Intracatheter, PRN, Ezekiel Slocumb, DO   Physical exam:  Vitals:   09/11/20 0510 09/11/20 0735 09/11/20 1104 09/11/20 1124  BP: 118/64 (!) 145/77 134/84   Pulse: (!) 52 (!) 58 (!) 56   Resp: 14 10 10    Temp: 97.9 F (36.6 C) 97.9 F (36.6 C) 98.3 F (36.8 C)  97.8 F (36.6 C)  TempSrc: Oral Oral Oral Oral  SpO2: 99% 98% 98%   Weight: 134 lb 12.8 oz (61.1 kg)     Height:       Physical Exam Constitutional:      General: She is not in acute distress.    Appearance: She is not diaphoretic.  HENT:     Head: Normocephalic and atraumatic.     Nose: Nose normal.     Mouth/Throat:     Pharynx: No oropharyngeal exudate.     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    Pupils: Pupils are equal, round, and reactive to light.  Cardiovascular:     Rate and Rhythm: Normal rate and regular rhythm.     Heart sounds: No murmur heard.   Pulmonary:     Effort: Pulmonary effort is normal. No respiratory distress.     Breath sounds: No rales.     Comments: Decreased breath sound bilaterally Chest:     Chest wall: No tenderness.  Abdominal:     General: There is no distension.     Palpations: Abdomen is soft.     Tenderness: There is no abdominal tenderness.  Musculoskeletal:        General: Normal range of motion.     Cervical back: Normal range of motion and neck supple.  Skin:    General: Skin is warm and dry.     Findings: No erythema.  Neurological:     Mental Status: She is alert and oriented to person, place, and time.     Cranial Nerves: No cranial nerve deficit.     Motor: No abnormal muscle tone.     Coordination: Coordination normal.  Psychiatric:        Mood and Affect: Affect normal.        CMP Latest Ref Rng & Units 09/11/2020  Glucose 70 - 99 mg/dL 89  BUN 8 - 23 mg/dL 7(L)  Creatinine 0.44 - 1.00 mg/dL <0.30(L)  Sodium 135 - 145 mmol/L 132(L)  Potassium 3.5  - 5.1 mmol/L 3.5  Chloride 98 - 111 mmol/L 99  CO2 22 - 32 mmol/L 28  Calcium 8.9 - 10.3 mg/dL 8.0(L)  Total Protein 6.5 - 8.1 g/dL -  Total Bilirubin 0.3 - 1.2 mg/dL -  Alkaline Phos 38 - 126 U/L -  AST 15 - 41 U/L -  ALT 0 - 44 U/L -   CBC Latest Ref Rng & Units 09/11/2020  WBC 4.0 - 10.5 K/uL 3.2(L)  Hemoglobin 12.0 - 15.0 g/dL 9.5(L)  Hematocrit 36 - 46 % 27.9(L)  Platelets 150 - 400 K/uL 271    RADIOGRAPHIC STUDIES: I have personally reviewed the radiological images as listed and agreed with the findings in the report. CT Chest W Contrast  Result Date: 09/06/2020 CLINICAL DATA:  Follow-up abnormal chest x-ray EXAM: CT CHEST WITH CONTRAST TECHNIQUE: Multidetector CT imaging of the chest was performed during intravenous contrast administration. CONTRAST:  56mL OMNIPAQUE IOHEXOL 300 MG/ML  SOLN COMPARISON:  Chest x-ray from earlier in the same day and CT from 08/08/2020. FINDINGS: Cardiovascular: Thoracic aorta demonstrates atherosclerotic calcifications. No dissection is noted. No aneurysmal dilatation is seen. The pulmonary artery as visualized shows no pulmonary embolism although not timed for true embolus evaluation. No cardiac enlargement is seen. No coronary calcifications are noted. Left-sided chest wall port is noted in satisfactory position. Mediastinum/Nodes: Thoracic inlet is  within normal limits. Scattered right hilar and subcarinal lymph nodes are seen. The largest of the hilar lymph nodes measures approximately 13 mm in short axis. Subcarinal node measures 10 mm in short axis. The esophagus as visualized is within normal limits. Lungs/Pleura: Lungs are well aerated bilaterally. The previously seen chest x-ray abnormality lies in the posterior aspect of the right upper lobe and is pleural base consistent with acute pneumonia. Patchy similar infiltrates are noted in the right middle lobe as well as the lingula on the left. The appearance of cavitary component seen on prior plain  film is not borne out on the CT and likely projectional in nature. No sizable parenchymal nodule is seen. No effusion or pneumothorax is noted. Upper Abdomen: Visualized upper abdomen shows mild fatty infiltration of the liver. Musculoskeletal: Degenerative changes of the thoracic spine are noted. No acute rib abnormality is noted. No compression deformity is seen. IMPRESSION: Patchy multifocal infiltrate worst in the right upper lobe posteriorly and laterally consistent with that seen on recent plain film examination. This is new from the prior CT consistent with multifocal pneumonia. Followup PA and lateral chest X-ray is recommended in 3-4 weeks following trial of antibiotic therapy to ensure resolution. Fatty liver. No other focal abnormality is noted. Aortic Atherosclerosis (ICD10-I70.0). Electronically Signed   By: Inez Catalina M.D.   On: 09/06/2020 16:12   DG Chest Portable 1 View  Result Date: 09/06/2020 CLINICAL DATA:  Weakness.  Hypotension. EXAM: PORTABLE CHEST 1 VIEW COMPARISON:  CT 08/08/2020.  Chest x-ray 08/08/2020. FINDINGS: PowerPort catheter noted with tip over superior vena cava. Cardiomegaly. No pulmonary venous congestion. Multifocal right lung infiltrates noted. Tiny cavitary infiltrate right upper lung cannot be excluded. Mild left base atelectasis scratched it mild bibasilar atelectasis. No pleural effusion or pneumothorax. IMPRESSION: 1. PowerPort catheter with tip over superior vena cava. 2. Multifocal right lung infiltrates noted. Tiny cavitary infiltrate right upper lung cannot be excluded. Electronically Signed   By: Marcello Moores  Register   On: 09/06/2020 13:26    Assessment and plan-  #Maxillary sinus cancer, patient has been on concurrent chemoradiation. I have discussed with radiation oncology Dr. Baruch Gouty and we both agree upon holding chemotherapy and radiation at this point until she fully recovers from current acute infection.  #Multifocal pneumonia, improving.    Patient  likely going to be discharged today to complete her course of oral antibiotics.  #Atrial fibrillation with RVR Was seen by cardiology.  Recommend continue metoprolol, Eliquis.  Recommend amiodarone   #Electrolyte derangement, hypokalemia and hypomagnesia have improved. This is likely secondary to cisplatin treatments.  Recommend patient to resume oral chronic potassium and magnesium supplementation upon discharge.  #Chronic dysphagia I recommend PEG tube placement and patient declined.  Patient has a follow-up appointment with me on 09/16/2020 for reevaluation post hospitalization follow-up.  Thank you for allowing me to participate in the care of this patient.   Earlie Server, MD, PhD Hematology Oncology [facility] at [facility] Pager- [phone] 09/11/2020

## 2020-09-11 NOTE — Plan of Care (Signed)
  Problem: Education: Goal: Knowledge of General Education information will improve Description: Including pain rating scale, medication(s)/side effects and non-pharmacologic comfort measures Outcome: Progressing   Problem: Activity: Goal: Risk for activity intolerance will decrease Outcome: Progressing   Problem: Coping: Goal: Level of anxiety will decrease Outcome: Progressing   Problem: Elimination: Goal: Will not experience complications related to bowel motility Outcome: Progressing Goal: Will not experience complications related to urinary retention Outcome: Progressing   Note: Patient ambulated to bathroom without c/o weakness or SOB.

## 2020-09-11 NOTE — TOC Transition Note (Signed)
Transition of Care [facility]) - CM/SW Discharge Note   Patient Details  Name: Shannon Schroeder MRN: [id number] Date of Birth: 1956-03-24  [facility] CM/SW Contact:  Eileen Stanford, LCSW Phone Number: 09/11/2020, 10:47 AM   Clinical Narrative:  Patient is dc home today. Patient is refusing HH. Gilford Rile has been delivered. No additional needs at this time.     Final next level of care: Home/Self Care Barriers to Discharge: No Barriers Identified   Patient Goals and CMS Choice Patient states their goals for this hospitalization and ongoing recovery are:: to get stronger CMS Medicare.gov Compare Post Acute Care list provided to:: Other (Comment Required) (patient refusing HH at this time) Choice offered to / list presented to :  (patient is refusing HH at this time)  Discharge Placement                    Patient and family notified of of transfer: 09/11/20  Discharge Plan and Services In-house Referral: Clinical Social Work   Post Acute Care Choice: NA (patient is refusing HH at this time)                    HH Arranged: NA (patient is refusing HH at this time)          Social Determinants of Health (SDOH) Interventions     Readmission Risk Interventions Readmission Risk Prevention Plan 09/08/2020  Transportation Screening Complete  PCP or Specialist Appt within 3-5 Days Complete  HRI or [place] Complete  Social Work Consult for [place] Planning/Counseling Complete  Palliative Care Screening Not Applicable  Medication Review Press photographer) Complete  Some recent data might be hidden

## 2020-09-11 NOTE — Discharge Summary (Signed)
Physician Discharge Summary  Shannon Schroeder LSL:[id number] DOB: [birth year] DOA: 09/06/2020  PCP: Pcp, No  Admit date: 09/06/2020 Discharge date: 09/11/2020  Admitted From: Home Disposition: Home  Recommendations for Outpatient Follow-up:  1. Follow ups as below. 2. Please obtain CBC/BMP/Mag at follow up 3. Please follow up on the following pending results: None  Home Health: None required Equipment/Devices: None required  Discharge Condition: Stable CODE STATUS: Full code   Follow-up Information    Minna Merritts, MD. Schedule an appointment as soon as possible for a visit in 2 week(s).   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  She was found to be in A-fib with RVR, rates up to 150-160's.  Also hypotensive 83/68 which responded to IV fluids.  Evaluation in the ED revealed multifocal pneumonia.  Started on broad spectrum antibiotics and admitted to hospitalist service.    Patient met criteria for severe sepsis due to multifocal pneumonia.  She completed appropriate antibiotics for multifocal pneumonia for 5 days, her respiratory symptoms resolved.  Regards to PSVT/A. fib with RVR, she was started on IV amiodarone and transition to p.o.  She is discharged on amiodarone 400 mg twice daily for 1 week followed by 200 mg twice daily for 1 week, then 200 mg daily.  She is also on low-dose metoprolol, and Eliquis for anticoagulation.  Patient was evaluated by therapy who recommended home health but patient refused.  See individual problem  list below for more hospital course.   Discharge Diagnoses:  Severe sepsis due to multifocal pneumonia: Completed antibiotic course for 5 days.  Blood cultures NGTD. Sputum culture with normal flora.  SVT/A. fib with RVR: Rate controlled on amiodarone. -Discharged on p.o. amiodarone as above -Continue low-dose metoprolol -On Eliquis for anticoagulation.  Elevated troponin: Likely demand ischemia in the setting of sepsis and A. fib with RVR.  Essential hypertension: Normotensive off lisinopril here.  Reportedly, she was taken off lisinopril by PCP prior to admission. -Continue low-dose metoprolol 25 mg twice daily  Chronic COPD: Stable. -Continue home medications.  Generalized weakness: -Refused home health.  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  Follows with Dr. Tasia Catchings and currently on chemotherapy and radiation.  Last chemo Monday 11/1.  Was to have radiation day of admission, but found hypotensive and in rapid A-fib, now admitted, treatment was not able to be done. --Continue home Decadron --Diet as tolerated, dysphagia 3 is ordered  Body mass index is 21.76 kg/m. Nutrition Problem: Increased nutrient needs Etiology: cancer and cancer related treatments Signs/Symptoms: estimated needs        Discharge Exam: Vitals:   09/11/20 1104 09/11/20 1124  BP: 134/84   Pulse: (!) 56   Resp: 10   Temp: 98.3 F (36.8 C) 97.8 F (36.6 C)  SpO2: 98%     GENERAL: No apparent distress.  Nontoxic. HEENT: MMM.  Vision and hearing grossly intact.  NECK: Supple.  No apparent JVD.  RESP:  No IWOB.  Fair aeration bilaterally. CVS:  RRR. Heart sounds normal.  ABD/GI/GU: Bowel  sounds present. Soft. Non tender.  MSK/EXT:  Moves extremities. No apparent deformity. No edema.  SKIN: no apparent skin lesion or wound NEURO: Awake, alert and oriented appropriately.  No apparent focal neuro deficit. PSYCH: Calm.  Normal affect.   Discharge Instructions  Discharge Instructions    Call MD for:  difficulty breathing, headache or visual disturbances   Complete by: As directed    Call MD for:  extreme fatigue   Complete by: As directed    Call MD for:  persistant dizziness or light-headedness   Complete by: As directed    Diet - low sodium heart healthy   Complete by: As directed    Discharge instructions   Complete by: As directed    It has been a pleasure taking care of you!  You were hospitalized severe sepsis due to pneumonia and atrial fibrillation (irregular and fast heart rate).  You were treated for both conditions and your symptoms improved to the point we think it is safe to let you go home and follow-up with your doctors.  Please review your new medication list and the directions on your medications before you take them.  Follow-up with your primary care doctor and cardiologist in 1 to 2 weeks   Take care,   Increase activity slowly   Complete by: As directed      Allergies as of 09/11/2020      Reactions   Aspirin Swelling, Other (See Comments)   Belladonna Alkaloids Rash   Fluoxetine Hcl Other (See Comments), Rash   Naproxen Swelling, Rash, Other (See Comments)   Phenobarbital Rash, Other (See Comments)   Prozac [fluoxetine Hcl] Rash      Medication List    STOP taking these medications   b complex vitamins capsule   GINKOBA PO   hydrALAZINE 25 MG tablet Commonly known as: APRESOLINE   ibuprofen 400 MG tablet Commonly known as: ADVIL   lisinopril 40 MG tablet Commonly known as: ZESTRIL     TAKE these medications   acyclovir 400 MG tablet Commonly known as: ZOVIRAX Take 1 tablet (400 mg total) by mouth 2 (two) times daily.   amiodarone 200 MG tablet Commonly known as: PACERONE Take 2 tablets (400 mg total) by mouth 2 (two) times daily for 7 days, THEN 1 tablet (200 mg total) 2 (two) times daily for 7 days, THEN 1 tablet (200 mg total) daily. Start taking  on: September 11, 2020   apixaban 5 MG Tabs tablet Commonly known as: ELIQUIS Take 1 tablet (5 mg total) by mouth 2 (two) times daily.   CENTRUM SILVER PO Take 1 tablet by mouth daily. Gummie   chlorhexidine 0.12 % solution Commonly known as: Peridex Use as directed 10 mLs in the mouth or throat 2 (two) times daily. Swish and spit   dexamethasone 4 MG tablet Commonly known as: DECADRON Take 1 tablet (4 mg total) by mouth daily.   docusate sodium 100 MG capsule Commonly known as: Colace Take 1 capsule (100 mg total) by mouth daily.   feeding supplement Liqd Take 237 mLs by mouth 3 (three) times daily between meals.   fentaNYL 25 MCG/HR Commonly known as: [place] 1 patch onto the skin every 3 (three) days.   gabapentin 300 MG capsule Commonly known as: NEURONTIN Take 1 capsule (300 mg total) by mouth 3 (three) times daily.   lidocaine-prilocaine cream Commonly known as: EMLA Apply to affected area once What changed:   how much to take  how  to take this  when to take this  reasons to take this   Melatonin 10 MG Tabs Take 10 mg by mouth at bedtime as needed (sleep).   metoprolol tartrate 25 MG tablet Commonly known as: LOPRESSOR Take 1 tablet (25 mg total) by mouth 2 (two) times daily. What changed:   medication strength  how much to take   nicotine 21 mg/24hr patch Commonly known as: NICODERM CQ - dosed in mg/24 hours Place 1 patch (21 mg total) onto the skin daily.   oxyCODONE-acetaminophen 5-325 MG tablet Commonly known as: Percocet Take 1 tablet by mouth every 4 (four) hours as needed for severe pain.   polyethylene glycol powder 17 GM/SCOOP powder Commonly known as: GLYCOLAX/MIRALAX Take 17 g by mouth daily as needed for mild constipation or moderate constipation. What changed:   how much to take  reasons to take this   potassium chloride SA 20 MEQ tablet Commonly known as: KLOR-CON Take 1 tablet (20 mEq total) by mouth daily.     prochlorperazine 10 MG tablet Commonly known as: COMPAZINE Take 1 tablet (10 mg total) by mouth every 6 (six) hours as needed (Nausea or vomiting).   Tylenol 8 Hour Arthritis Pain 650 MG CR tablet Generic drug: acetaminophen Take 1,300 mg by mouth daily as needed (Pain).            Durable Medical Equipment  (From admission, onward)         Start     Ordered   09/09/20 1528  For home use only DME Walker rolling  Once       Question Answer Comment  Walker: With 5 Inch Wheels   Patient needs a walker to treat with the following condition Generalized weakness      09/09/20 1527          Consultations:  Cardiology  Procedures/Studies:   CT Chest W Contrast  Result Date: 09/06/2020 CLINICAL DATA:  Follow-up abnormal chest x-ray EXAM: CT CHEST WITH CONTRAST TECHNIQUE: Multidetector CT imaging of the chest was performed during intravenous contrast administration. CONTRAST:  29m OMNIPAQUE IOHEXOL 300 MG/ML  SOLN COMPARISON:  Chest x-ray from earlier in the same day and CT from 08/08/2020. FINDINGS: Cardiovascular: Thoracic aorta demonstrates atherosclerotic calcifications. No dissection is noted. No aneurysmal dilatation is seen. The pulmonary artery as visualized shows no pulmonary embolism although not timed for true embolus evaluation. No cardiac enlargement is seen. No coronary calcifications are noted. Left-sided chest wall port is noted in satisfactory position. Mediastinum/Nodes: Thoracic inlet is within normal limits. Scattered right hilar and subcarinal lymph nodes are seen. The largest of the hilar lymph nodes measures approximately 13 mm in short axis. Subcarinal node measures 10 mm in short axis. The esophagus as visualized is within normal limits. Lungs/Pleura: Lungs are well aerated bilaterally. The previously seen chest x-ray abnormality lies in the posterior aspect of the right upper lobe and is pleural base consistent with acute pneumonia. Patchy similar infiltrates  are noted in the right middle lobe as well as the lingula on the left. The appearance of cavitary component seen on prior plain film is not borne out on the CT and likely projectional in nature. No sizable parenchymal nodule is seen. No effusion or pneumothorax is noted. Upper Abdomen: Visualized upper abdomen shows mild fatty infiltration of the liver. Musculoskeletal: Degenerative changes of the thoracic spine are noted. No acute rib abnormality is noted. No compression deformity is seen. IMPRESSION: Patchy multifocal infiltrate worst in the right upper  lobe posteriorly and laterally consistent with that seen on recent plain film examination. This is new from the prior CT consistent with multifocal pneumonia. Followup PA and lateral chest X-ray is recommended in 3-4 weeks following trial of antibiotic therapy to ensure resolution. Fatty liver. No other focal abnormality is noted. Aortic Atherosclerosis (ICD10-I70.0). Electronically Signed   By: Inez Catalina M.D.   On: 09/06/2020 16:12   DG Chest Portable 1 View  Result Date: 09/06/2020 CLINICAL DATA:  Weakness.  Hypotension. EXAM: PORTABLE CHEST 1 VIEW COMPARISON:  CT 08/08/2020.  Chest x-ray 08/08/2020. FINDINGS: PowerPort catheter noted with tip over superior vena cava. Cardiomegaly. No pulmonary venous congestion. Multifocal right lung infiltrates noted. Tiny cavitary infiltrate right upper lung cannot be excluded. Mild left base atelectasis scratched it mild bibasilar atelectasis. No pleural effusion or pneumothorax. IMPRESSION: 1. PowerPort catheter with tip over superior vena cava. 2. Multifocal right lung infiltrates noted. Tiny cavitary infiltrate right upper lung cannot be excluded. Electronically Signed   By: Marcello Moores  Register   On: 09/06/2020 13:26        The results of significant diagnostics from this hospitalization (including imaging, microbiology, ancillary and laboratory) are listed below for reference.     Microbiology: Recent  Results (from the past 240 hour(s))  Blood culture (routine x 2)     Status: None   Collection Time: 09/06/20 12:53 PM   Specimen: BLOOD  Result Value Ref Range Status   Specimen Description BLOOD RAC  Final   Special Requests   Final    BOTTLES DRAWN AEROBIC AND ANAEROBIC Blood Culture adequate volume   Culture   Final    NO GROWTH 5 DAYS Performed at [facility], [address] [place], [place] [address]    Report Status 09/11/2020 FINAL  Final  Respiratory Panel by RT PCR (Flu A&B, Covid) - Nasopharyngeal Swab     Status: None   Collection Time: 09/06/20  1:01 PM   Specimen: Nasopharyngeal Swab  Result Value Ref Range Status   SARS Coronavirus 2 by RT PCR NEGATIVE NEGATIVE Final    Comment: (NOTE) SARS-CoV-2 target nucleic acids are NOT DETECTED.  The SARS-CoV-2 RNA is generally detectable in upper respiratoy specimens during the acute phase of infection. The lowest concentration of SARS-CoV-2 viral copies this assay can detect is 131 copies/mL. A negative result does not preclude SARS-Cov-2 infection and should not be used as the sole basis for treatment or other patient management decisions. A negative result may occur with  improper specimen collection/handling, submission of specimen other than nasopharyngeal swab, presence of viral mutation(s) within the areas targeted by this assay, and inadequate number of viral copies (<131 copies/mL). A negative result must be combined with clinical observations, patient history, and epidemiological information. The expected result is Negative.  Fact Sheet for Patients:  PinkCheek.be  Fact Sheet for Healthcare Providers:  GravelBags.it  This test is no t yet approved or cleared by the [place] FDA and  has been authorized for detection and/or diagnosis of SARS-CoV-2 by FDA under an Emergency Use Authorization (EUA). This EUA will remain  in effect (meaning  this test can be used) for the duration of the COVID-19 declaration under Section 564(b)(1) of the Act, 21 U.S.C. section 360bbb-3(b)(1), unless the authorization is terminated or revoked sooner.     Influenza A by PCR NEGATIVE NEGATIVE Final   Influenza B by PCR NEGATIVE NEGATIVE Final    Comment: (NOTE) The Xpert Xpress SARS-CoV-2/FLU/RSV assay is intended as an  aid in  the diagnosis of influenza from Nasopharyngeal swab specimens and  should not be used as a sole basis for treatment. Nasal washings and  aspirates are unacceptable for Xpert Xpress SARS-CoV-2/FLU/RSV  testing.  Fact Sheet for Patients: PinkCheek.be  Fact Sheet for Healthcare Providers: GravelBags.it  This test is not yet approved or cleared by the [place] FDA and  has been authorized for detection and/or diagnosis of SARS-CoV-2 by  FDA under an Emergency Use Authorization (EUA). This EUA will remain  in effect (meaning this test can be used) for the duration of the  Covid-19 declaration under Section 564(b)(1) of the Act, 21  U.S.C. section 360bbb-3(b)(1), unless the authorization is  terminated or revoked. Performed at [facility], [address] [place], [place] [address]   Blood culture (routine x 2)     Status: None   Collection Time: 09/06/20  1:15 PM   Specimen: BLOOD  Result Value Ref Range Status   Specimen Description BLOOD LA  Final   Special Requests   Final    BOTTLES DRAWN AEROBIC AND ANAEROBIC Blood Culture adequate volume   Culture   Final    NO GROWTH 5 DAYS Performed at [facility], [address] [place], [place] [address]    Report Status 09/11/2020 FINAL  Final  MRSA PCR Screening     Status: Abnormal   Collection Time: 09/06/20  5:10 PM   Specimen: Nasopharyngeal  Result Value Ref Range Status   MRSA by PCR POSITIVE (A) NEGATIVE Final    Comment:        The GeneXpert MRSA Assay (FDA approved  for NASAL specimens only), is one component of a comprehensive MRSA colonization surveillance program. It is not intended to diagnose MRSA infection nor to guide or monitor treatment for MRSA infections. RESULT CALLED TO, READ BACK BY AND VERIFIED WITH: LINDSAY BLACK ON 09/06/20 AT 2130 QSD Performed at [facility], [address] [place], [place] [address]   Culture, sputum-assessment     Status: None   Collection Time: 09/06/20  7:27 PM   Specimen: Sputum  Result Value Ref Range Status   Specimen Description SPUTUM  Final   Special Requests NONE  Final   Sputum evaluation   Final    THIS SPECIMEN IS ACCEPTABLE FOR SPUTUM CULTURE Performed at [facility], [address] [place], [place] [address]    Report Status 09/06/2020 FINAL  Final  Culture, respiratory     Status: None   Collection Time: 09/06/20  7:27 PM   Specimen: SPU  Result Value Ref Range Status   Specimen Description   Final    SPUTUM Performed at [facility], [address] [place], [place] [address]    Special Requests   Final    NONE Reflexed from [id number] Performed at [facility], [address] [place], [place] [address]    Gram Stain   Final    ABUNDANT WBC PRESENT, PREDOMINANTLY PMN MODERATE GRAM POSITIVE COCCI FEW GRAM POSITIVE RODS RARE GRAM NEGATIVE RODS    Culture   Final    Consistent with normal respiratory flora. No Pseudomonas species isolated Performed at [facility] [address] [place], [place] [address]    Report Status 09/08/2020 FINAL  Final     Labs: BNP (last 3 results) Recent Labs    08/08/20 0955  BNP 376.2*   Basic Metabolic Panel: Recent Labs  Lab 09/08/20 0343 09/09/20 0453 09/09/20  2244 09/10/20 0353 09/11/20 0625  NA 132* 133* 133* 134* 132*  K 3.6 3.9 3.2* 4.3 3.5  CL 96* 100 97* 100 99  CO2 '27 28 26 29 28  ' GLUCOSE 112* 85 94 82 89  BUN '10 14 12 10 ' 7*  CREATININE 0.52 0.30* 0.44 0.43* <0.30*    CALCIUM 7.9* 7.6* 7.7* 8.0* 8.0*  MG 1.7 1.7 1.8 2.3 1.6*   Liver Function Tests: Recent Labs  Lab 09/04/20 1249 09/06/20 1141 09/06/20 1243  AST '28 20 19  ' ALT '17 17 16  ' ALKPHOS 90 86 85  BILITOT 0.5 0.5 0.7  PROT 5.8* 5.7* 5.6*  ALBUMIN 2.7* 2.6* 2.6*   No results for input(s): LIPASE, AMYLASE in the last 168 hours. No results for input(s): AMMONIA in the last 168 hours. CBC: Recent Labs  Lab 09/04/20 1249 09/04/20 1249 09/06/20 1141 09/06/20 1141 09/06/20 1243 09/07/20 0802 09/08/20 0343 09/08/20 0343 09/09/20 0453 09/09/20 1348 09/09/20 2244 09/10/20 0353 09/11/20 0625  WBC 7.5   < > 6.7   < > 8.2   < > 5.0  --  4.0  --  5.1 5.1 3.2*  NEUTROABS 5.9  --  5.3  --  6.4  --   --   --   --   --   --   --   --   HGB 9.8*   < > 10.0*   < > 10.0*   < > 11.1*   < > 8.9* 9.8* 10.0* 9.7* 9.5*  HCT 29.5*   < > 29.7*   < > 30.7*   < > 32.3*   < > 26.4* 29.2* 29.5* 29.6* 27.9*  MCV 100.0   < > 98.7   < > 100.0   < > 97.3  --  97.8  --  99.3 100.0 98.6  PLT 367   < > 332   < > 338   < > 363  --  289  --  316 329 271   < > = values in this interval not displayed.   Cardiac Enzymes: No results for input(s): CKTOTAL, CKMB, CKMBINDEX, TROPONINI in the last 168 hours. BNP: Invalid input(s): POCBNP CBG: No results for input(s): GLUCAP in the last 168 hours. D-Dimer No results for input(s): DDIMER in the last 72 hours. Hgb A1c No results for input(s): HGBA1C in the last 72 hours. Lipid Profile No results for input(s): CHOL, HDL, LDLCALC, TRIG, CHOLHDL, LDLDIRECT in the last 72 hours. Thyroid function studies No results for input(s): TSH, T4TOTAL, T3FREE, THYROIDAB in the last 72 hours.  Invalid input(s): FREET3 Anemia work up Recent Labs    09/09/20 0938 09/09/20 0939  VITAMINB12  --  449  FOLATE 11.2  --   FERRITIN 136  --   TIBC 210*  --   IRON 46  --   RETICCTPCT 1.6  --    Urinalysis    Component Value Date/Time   COLORURINE YELLOW (A) 09/06/2020 1315    APPEARANCEUR CLEAR (A) 09/06/2020 1315   APPEARANCEUR Clear 12/21/2018 1041   LABSPEC 1.009 09/06/2020 1315   LABSPEC 1.017 04/23/2015 0000   PHURINE 7.0 09/06/2020 [place] 09/06/2020 1315   HGBUR NEGATIVE 09/06/2020 1315   [place] 09/06/2020 1315   BILIRUBINUR Negative 12/21/2018 1041   KETONESUR 5 (A) 09/06/2020 1315   PROTEINUR NEGATIVE 09/06/2020 1315   NITRITE NEGATIVE 09/06/2020 1315   LEUKOCYTESUR NEGATIVE 09/06/2020 1315   Sepsis Labs Invalid input(s): PROCALCITONIN,  WBC,  LACTICIDVEN   Time coordinating discharge: 40 minutes  SIGNED:  Mercy Riding, MD  [facility] 09/11/2020, 11:25 AM  If 7PM-7AM, please contact night-coverage [email or web address]

## 2020-09-11 NOTE — Progress Notes (Signed)
Patient alert and oriented, vss, no complaints of pain.  D/c telemetry and piv.  Patient will be escorted out of hospital via wheelchair by volunteers.

## 2020-09-11 NOTE — Care Management Important Message (Signed)
Important Message  Patient Details  Name: Shannon Schroeder MRN: [id number] Date of Birth: 12-05-1955   Medicare Important Message Given:  Yes     Dannette Barbara 09/11/2020, 11:18 AM

## 2020-09-11 NOTE — Progress Notes (Signed)
Progress Note  Patient Name: Shannon Schroeder Date of Encounter: 09/11/2020  Primary Cardiologist: Fletcher Anon  Subjective   No chest pain, dyspnea, or palpitations. Maintaining sinus rhythm on oral amiodarone and lower dose metoprolol.   Inpatient Medications    Scheduled Meds: . amiodarone  400 mg Oral BID  . apixaban  5 mg Oral BID  . chlorhexidine  10 mL Mouth/Throat BID  . Chlorhexidine Gluconate Cloth  6 each Topical Q0600  . dexamethasone  4 mg Oral Daily  . docusate sodium  100 mg Oral Daily  . doxycycline  100 mg Oral Q12H  . feeding supplement  1 Container Oral TID BM  . fentaNYL  1 patch Transdermal Q72H  . gabapentin  300 mg Oral TID  . metoprolol tartrate  25 mg Oral BID  . multivitamin with minerals  1 tablet Oral Daily  . mupirocin ointment  1 application Nasal BID  . nicotine  21 mg Transdermal Daily  . potassium chloride  60 mEq Oral Once  . sodium chloride flush  10-40 mL Intracatheter Q12H   Continuous Infusions: . sodium chloride Stopped (09/09/20 1904)  . cefTRIAXone (ROCEPHIN)  IV 2 g (09/10/20 1431)  . magnesium sulfate bolus IVPB     PRN Meds: sodium chloride, acetaminophen, albuterol, dextromethorphan-guaiFENesin, lidocaine-prilocaine, melatonin, ondansetron (ZOFRAN) IV, oxyCODONE-acetaminophen, polyethylene glycol powder, prochlorperazine, sodium chloride flush   Vital Signs    Vitals:   09/11/20 0450 09/11/20 0455 09/11/20 0510 09/11/20 0735  BP:   118/64 (!) 145/77  Pulse:   (!) 52 (!) 58  Resp: 10 14 14 10   Temp:   97.9 F (36.6 C) 97.9 F (36.6 C)  TempSrc:   Oral Oral  SpO2:   99% 98%  Weight:   61.1 kg   Height:        Intake/Output Summary (Last 24 hours) at 09/11/2020 0852 Last data filed at 09/11/2020 0654 Gross per 24 hour  Intake 600 ml  Output 800 ml  Net -200 ml   Filed Weights   09/09/20 0522 09/10/20 0346 09/11/20 0510  Weight: 62 kg 59.8 kg 61.1 kg    Telemetry    SR with heart rates in the 50s to 70s  bpm, rare PVCs and PACs - Personally Reviewed  ECG    No new tracings - Personally Reviewed  Physical Exam   GEN: Frail; No acute distress.   Neck: No JVD. Cardiac: RRR, no murmurs, rubs, or gallops.  Respiratory: Mildly diminished throughout.  GI: Soft, nontender, non-distended.   MS: No edema; No deformity. Neuro:  Alert and oriented x 3; Nonfocal.  Psych: Normal affect.  Labs    Chemistry Recent Labs  Lab 09/04/20 1249 09/04/20 1249 09/06/20 1141 09/06/20 1141 09/06/20 1243 09/07/20 0802 09/09/20 2244 09/10/20 0353 09/11/20 0625  NA 136   < > 136   < > 140   < > 133* 134* 132*  K 3.3*   < > 2.7*   < > 2.8*   < > 3.2* 4.3 3.5  CL 99   < > 97*   < > 99   < > 97* 100 99  CO2 27   < > 27   < > 26   < > 26 29 28   GLUCOSE 163*   < > 133*   < > 107*   < > 94 82 89  BUN 18   < > 13   < > 12   < > 12 10 7*  CREATININE 0.48   < > 0.49   < > 0.39*   < > 0.44 0.43* <0.30*  CALCIUM 8.3*   < > 7.9*   < > 7.6*   < > 7.7* 8.0* 8.0*  PROT 5.8*  --  5.7*  --  5.6*  --   --   --   --   ALBUMIN 2.7*  --  2.6*  --  2.6*  --   --   --   --   AST 28  --  20  --  19  --   --   --   --   ALT 17  --  17  --  16  --   --   --   --   ALKPHOS 90  --  86  --  85  --   --   --   --   BILITOT 0.5  --  0.5  --  0.7  --   --   --   --   GFRNONAA >60   < > >60   < > >60   < > >60 >60 NOT CALCULATED  ANIONGAP 10   < > 12   < > 15   < > 10 5 5    < > = values in this interval not displayed.     Hematology Recent Labs  Lab 09/09/20 2244 09/10/20 0353 09/11/20 0625  WBC 5.1 5.1 3.2*  RBC 2.97* 2.96* 2.83*  HGB 10.0* 9.7* 9.5*  HCT 29.5* 29.6* 27.9*  MCV 99.3 100.0 98.6  MCH 33.7 32.8 33.6  MCHC 33.9 32.8 34.1  RDW 13.2 13.4 13.5  PLT 316 329 271    Cardiac EnzymesNo results for input(s): TROPONINI in the last 168 hours. No results for input(s): TROPIPOC in the last 168 hours.   BNPNo results for input(s): BNP, PROBNP in the last 168 hours.   DDimer No results for input(s): DDIMER in  the last 168 hours.   Radiology    CT Chest W Contrast  Result Date: 09/06/2020 IMPRESSION: Patchy multifocal infiltrate worst in the right upper lobe posteriorly and laterally consistent with that seen on recent plain film examination. This is new from the prior CT consistent with multifocal pneumonia. Followup PA and lateral chest X-ray is recommended in 3-4 weeks following trial of antibiotic therapy to ensure resolution. Fatty liver. No other focal abnormality is noted. Aortic Atherosclerosis (ICD10-I70.0). Electronically Signed   By: Inez Catalina M.D.   On: 09/06/2020 16:12   DG Chest Portable 1 View  Result Date: 09/06/2020 IMPRESSION: 1. PowerPort catheter with tip over superior vena cava. 2. Multifocal right lung infiltrates noted. Tiny cavitary infiltrate right upper lung cannot be excluded. Electronically Signed   By: Marcello Moores  Register   On: 09/06/2020 13:26   Cardiac Studies   2D echo 08/09/2020: 1. Left ventricular ejection fraction, by estimation, is 60 to 65%. The  left ventricle has normal function. The left ventricle has no regional  wall motion abnormalities. There is mild left ventricular hypertrophy.  Left ventricular diastolic parameters  are consistent with Grade II diastolic dysfunction (pseudonormalization).  2. Right ventricular systolic function is normal. The right ventricular  size is normal. There is normal pulmonary artery systolic pressure. The  estimated right ventricular systolic pressure is 53.2 mmHg.  3. The mitral valve is normal in structure. Mild mitral valve  regurgitation.  __________  2D echo 04/2019: 1. The left ventricle has normal systolic function with an ejection  fraction of 60-65%. The cavity size was normal. Left ventricular diastolic  parameters were normal.  2. The right ventricle has normal systolic function. The cavity was  normal. There is no increase in right ventricular wall thickness. Mildly  elevated RVSP, 32 mm Hg  3.  Left atrial size was mild-moderately dilated. __________  Nuclear stress test 05/2009: Impression:    - There is a small, mildly severe reversible defect  involving the mid anteroseptal wall. This is consistent with  probabl~e attenuation artifact and/or possibly ischemia.  - Global systolic function and thickening is normal. The  ejection fraction is greater than 65%.  - Breast attenuation is noted  - Sensitivity and specificity of this test are reduced by the  noted attenuation  - In the future, for larger patients, consider ordering PET  rubidium study  Patient Profile     64 y.o. 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 Arbutus Ped.  Assessment & Plan    1. Paroxysmal SVT with associated hypotension: -Likely in the setting of multifocal PNA and recent hypokalemia, which has been repleted  -Converted to sinus rhythm on IV amiodarone with subsequent improvement in HR and BP -She was quite symptomatic with SVT and has been symptomatic with Afib twice recently, leading to ED visits and hospital admissions  -In an effort to decrease her atrial arrhythmia burden she has been started on amiodarone 400 mg bid x 1 week, followed by 200 mg bid x 1 week, then 200 mg daily thereafter with no further atrial arrhythmia burden noted on telemetry  -In sinus, her rates are bradycardic, and in this setting we decreased her metoprolol to 25 mg bid, though may need to reduce further if she becomes significantly bradycardic  -TSH normal -Baseline LFT normal -Replete potassium and magnesium to goal 4.0 and  2.0, respectively   2. Persistent Afib with RVR: -Presented in Afib with RVR, likely in the setting of severe sepsis with multifocal PNA -Converted to sinus rhythm on Cardizem gtt -Amiodarone and metoprolol as above -CHADS2VASc 3 -Eliquis with close monitoring of her anemia  3. Elevated HS-Tn: -Not consistent with ACS -Supply demand ischemia in the context of Afib with RVR, severe sepsis with PNA and hypotension, and anemia -Consider outpatient ischemic testing once her acute illness has improved   4. Reported history of VT: -Details of this are not clear -Echo with preserved LVSF -Beta blocker and amiodarone as above  5. Hypokalemia/hypomagnesemia: -Repleted   6. Severe sepsis with multifocal PNA: -Management per IM  7. HTN: -BP soft upon admission and with SVT -Improved -Continue Lopressor as above -With decreased metoprolol dose, may need to compensate with another antihypertensive   For questions or updates, please contact [facility] Please consult [email or web address] for contact info under Cardiology/STEMI.    Signed, Christell Faith, PA-C [facility] Pager: [phone] 09/11/2020, 8:52 AM

## 2020-09-12 ENCOUNTER — Other Ambulatory Visit: Payer: Self-pay

## 2020-09-12 ENCOUNTER — Inpatient Hospital Stay: Payer: Medicaid Other

## 2020-09-12 ENCOUNTER — Ambulatory Visit: Payer: Medicaid Other

## 2020-09-13 ENCOUNTER — Ambulatory Visit: Payer: Medicaid Other

## 2020-09-13 NOTE — Progress Notes (Signed)
Tumor Board Documentation  MARICEL SWARTZENDRUBER was presented by Dr Tasia Catchings at our Tumor Board on 09/12/2020, which included representatives from medical oncology, radiation oncology, surgical oncology, internal medicine, navigation, pathology, radiology, surgical, pharmacy, genetics, research, palliative care, pulmonology.  Shannon Schroeder currently presents as a current patient, for [place], for new positive pathology with history of the following treatments: active survellience.  Additionally, we reviewed previous medical and familial history, history of present illness, and recent lab results along with all available histopathologic and imaging studies. The tumor board considered available treatment options and made the following recommendations: Chemotherapy    The following procedures/referrals were also placed: No orders of the defined types were placed in this encounter.   Clinical Trial Status: not discussed   Staging used: Clinical Stage  AJCC Staging: T: cT4b N: 1 M: 0 Group: Squamous Cell Carcinoma of Maxillary Sinus   National site-specific guidelines NCCN were discussed with respect to the case.  Tumor board is a meeting of clinicians from various specialty areas who evaluate and discuss patients for whom a multidisciplinary approach is being considered. Final determinations in the plan of care are those of the provider(s). The responsibility for follow up of recommendations given during tumor board is that of the provider.   Today's extended care, comprehensive team conference, Rettie was not present for the discussion and was not examined.   Multidisciplinary Tumor Board is a multidisciplinary case peer review process.  Decisions discussed in the Multidisciplinary Tumor Board reflect the opinions of the specialists present at the conference without having examined the patient.  Ultimately, treatment and diagnostic decisions rest with the primary provider(s) and the patient.

## 2020-09-16 ENCOUNTER — Ambulatory Visit
Admission: RE | Admit: 2020-09-16 | Discharge: 2020-09-16 | Disposition: A | Discharge status: Home / Self Care | Payer: Medicaid Other | Source: Ambulatory Visit | Attending: Radiation Oncology | Admitting: Radiation Oncology

## 2020-09-16 ENCOUNTER — Inpatient Hospital Stay (HOSPITAL_BASED_OUTPATIENT_CLINIC_OR_DEPARTMENT_OTHER): Payer: Medicaid Other | Admitting: Oncology

## 2020-09-16 ENCOUNTER — Other Ambulatory Visit: Payer: Self-pay

## 2020-09-16 ENCOUNTER — Inpatient Hospital Stay: Payer: Medicaid Other

## 2020-09-16 ENCOUNTER — Encounter: Payer: Self-pay | Admitting: Oncology

## 2020-09-16 VITALS — BP 137/75 | HR 61 | Temp 96.6°F | Resp 18 | Wt 129.7 lb

## 2020-09-16 DIAGNOSIS — R634 Abnormal weight loss: Secondary | ICD-10-CM

## 2020-09-16 DIAGNOSIS — Z510 Encounter for antineoplastic radiation therapy: Secondary | ICD-10-CM | POA: Diagnosis not present

## 2020-09-16 DIAGNOSIS — K5903 Drug induced constipation: Secondary | ICD-10-CM

## 2020-09-16 DIAGNOSIS — G893 Neoplasm related pain (acute) (chronic): Secondary | ICD-10-CM

## 2020-09-16 DIAGNOSIS — C760 Malignant neoplasm of head, face and neck: Secondary | ICD-10-CM

## 2020-09-16 DIAGNOSIS — C310 Malignant neoplasm of maxillary sinus: Secondary | ICD-10-CM

## 2020-09-16 DIAGNOSIS — Z5111 Encounter for antineoplastic chemotherapy: Secondary | ICD-10-CM

## 2020-09-16 DIAGNOSIS — E876 Hypokalemia: Secondary | ICD-10-CM

## 2020-09-16 DIAGNOSIS — Z8679 Personal history of other diseases of the circulatory system: Secondary | ICD-10-CM

## 2020-09-16 LAB — COMPREHENSIVE METABOLIC PANEL
ALT: 15 U/L (ref 0–44)
AST: 19 U/L (ref 15–41)
Albumin: 3 g/dL — ABNORMAL LOW (ref 3.5–5.0)
Alkaline Phosphatase: 68 U/L (ref 38–126)
Anion gap: 8 (ref 5–15)
BUN: 14 mg/dL (ref 8–23)
CO2: 28 mmol/L (ref 22–32)
Calcium: 8.6 mg/dL — ABNORMAL LOW (ref 8.9–10.3)
Chloride: 99 mmol/L (ref 98–111)
Creatinine, Ser: 0.46 mg/dL (ref 0.44–1.00)
GFR, Estimated: >60 mL/min (ref >60)
Glucose, Bld: 147 mg/dL — ABNORMAL HIGH (ref 70–99)
Potassium: 3 mmol/L — ABNORMAL LOW (ref 3.5–5.1)
Sodium: 135 mmol/L (ref 135–145)
Total Bilirubin: 0.4 mg/dL (ref 0.3–1.2)
Total Protein: 5.9 g/dL — ABNORMAL LOW (ref 6.5–8.1)

## 2020-09-16 LAB — CBC WITH DIFFERENTIAL/PLATELET
Abs Immature Granulocytes: 0.01 10*3/uL (ref 0.00–0.07)
Basophils Absolute: 0 10*3/uL (ref 0.0–0.1)
Basophils Relative: 1 %
Eosinophils Absolute: 0 10*3/uL (ref 0.0–0.5)
Eosinophils Relative: 1 %
HCT: 30.2 % — ABNORMAL LOW (ref 36.0–46.0)
Hemoglobin: 10.1 g/dL — ABNORMAL LOW (ref 12.0–15.0)
Immature Granulocytes: 0 %
Lymphocytes Relative: 13 %
Lymphs Abs: 0.6 10*3/uL — ABNORMAL LOW (ref 0.7–4.0)
MCH: 33.6 pg (ref 26.0–34.0)
MCHC: 33.4 g/dL (ref 30.0–36.0)
MCV: 100.3 fL — ABNORMAL HIGH (ref 80.0–100.0)
Monocytes Absolute: 0.4 10*3/uL (ref 0.1–1.0)
Monocytes Relative: 9 %
Neutro Abs: 3.3 10*3/uL (ref 1.7–7.7)
Neutrophils Relative %: 76 %
Platelets: 274 10*3/uL (ref 150–400)
RBC: 3.01 MIL/uL — ABNORMAL LOW (ref 3.87–5.11)
RDW: 15.1 % (ref 11.5–15.5)
WBC: 4.3 10*3/uL (ref 4.0–10.5)
nRBC: 0 % (ref 0.0–0.2)

## 2020-09-16 LAB — MAGNESIUM: Magnesium: 1.7 mg/dL (ref 1.7–2.4)

## 2020-09-16 MED ORDER — MAGNESIUM CHLORIDE 64 MG PO TBEC: 1.0000 | DELAYED_RELEASE_TABLET | Freq: Every day | ORAL | 1 refills | Status: DC

## 2020-09-16 MED ORDER — SODIUM CHLORIDE 0.9 % IV SOLN
10.0000 mg | Freq: Once | INTRAVENOUS | Status: AC
  Administered 2020-09-16: 10 mg via INTRAVENOUS
  Filled 2020-09-16: qty 10

## 2020-09-16 MED ORDER — HEPARIN SOD (PORK) LOCK FLUSH 100 UNIT/ML IV SOLN
500.0000 [IU] | Freq: Once | INTRAVENOUS | Status: AC
  Administered 2020-09-16: 500 [IU] via INTRAVENOUS
  Filled 2020-09-16: qty 5

## 2020-09-16 MED ORDER — SODIUM CHLORIDE 0.9 % IV SOLN
Freq: Once | INTRAVENOUS | Status: AC
  Filled 2020-09-16: qty 250

## 2020-09-16 MED ORDER — SODIUM CHLORIDE 0.9 % IV SOLN
40.0000 mg/m2 | Freq: Once | INTRAVENOUS | Status: AC
  Administered 2020-09-16: 66 mg via INTRAVENOUS
  Filled 2020-09-16: qty 66

## 2020-09-16 MED ORDER — SODIUM CHLORIDE 0.9 % IV SOLN
Freq: Once | INTRAVENOUS | Status: AC
  Filled 2020-09-16: qty 1000

## 2020-09-16 MED ORDER — SODIUM CHLORIDE 0.9 % IV SOLN
150.0000 mg | Freq: Once | INTRAVENOUS | Status: AC
  Administered 2020-09-16: 150 mg via INTRAVENOUS
  Filled 2020-09-16: qty 150

## 2020-09-16 MED ORDER — HEPARIN SOD (PORK) LOCK FLUSH 100 UNIT/ML IV SOLN
500.0000 [IU] | Freq: Once | INTRAVENOUS | Status: DC | PRN
  Filled 2020-09-16: qty 5

## 2020-09-16 MED ORDER — PALONOSETRON HCL INJECTION 0.25 MG/5ML
0.2500 mg | Freq: Once | INTRAVENOUS | Status: AC
  Administered 2020-09-16: 0.25 mg via INTRAVENOUS
  Filled 2020-09-16: qty 5

## 2020-09-16 MED ORDER — OXYCODONE-ACETAMINOPHEN 5-325 MG PO TABS: 2.0000 | ORAL_TABLET | Freq: Three times a day (TID) | ORAL | 0 refills | Status: DC | PRN

## 2020-09-16 MED ORDER — SODIUM CHLORIDE 0.9% FLUSH
10.0000 mL | INTRAVENOUS | Status: DC | PRN
  Administered 2020-09-16: 10 mL via INTRAVENOUS
  Filled 2020-09-16: qty 10

## 2020-09-16 NOTE — Progress Notes (Signed)
Patient here for follow up. Reports being in the hopsital for a few days and received IV antibiotic for pneumonia. Pt reports she is feeling better today.

## 2020-09-16 NOTE — Progress Notes (Signed)
Per Benjamine Mola RN per Dr. Tasia Catchings okay to proceed with Cisplatin treatment as scheduled.  1138: pt complete with 2 hours of pre-hydration fluids. Pt only able to urinate 150 cc. Dr. Tasia Catchings aware. Per Dr. Tasia Catchings proceed with treatment as scheduled. Pt to receive additional 1 Liter NS over 1 hour after treatment. **see MAR**.    1540: Pt tolerated infusion well. No s/s of distress or reaction noted. Pt stable at discharge.

## 2020-09-16 NOTE — Progress Notes (Signed)
Hematology/Oncology follow up note [facility] [phone] [phone]   Patient Care Team: Pcp, No as PCP - General Rockey Situ, Kathlene November, MD as PCP - Cardiology (Cardiology) Earlie Server, MD as Consulting Physician (Oncology)  CHIEF COMPLAINTS/REASON FOR VISIT:  Follow-up for head and neck cancer  HISTORY OF PRESENTING ILLNESS:   Shannon Schroeder is a  64 y.o.  female with PMH listed below was seen in consultation at the request of ER Dr. [name] for evaluation of abnormal CT scan.  07/10/2020 she presented emergency room for evaluation of right-sided facial swelling and pain in the right side of her mouth for the past 6 weeks.  It started in her mouth with a small ulcer which progressively got worse.  She wears denture.  Not able to chew well.  She eats soft food. She was found to have right upper gum also with yellowish discharge noted. 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  Avoid piloting.,  Right orbital apex, also the right soft palate and palate and tonsil.  Furthermore there is evidence of intracranial extension along the right V2 at the foramen rotundum Small suspicious right level 1A lymph node measuring 7 mm.  Separate small indeterminate but suspicious enhancing soft tissue nodule of the left sublingual space.  Patient was referred to oncology for further evaluation management. Patient denies fever, chills, nausea vomiting diarrhea shortness of breath or cough.  Reports 10 out of 10 severe pain of right face and numbness.  She also has headache. Reports unintentional weight loss  She reports no contactable family members.  She is in the process of pointing her roommate [name] to be her power of attorney.  She also wants to record today's conversation for her roommate  [name]. Patient has 50-pack-year smoking history.  # seen by ENT Dr.Juengel and had a biopsy. Biopsy was sent to [facility] and the final pathology was positive for invasive squamous cell carcinoma, well-differentiated, keratinizing. 07/17/2020, brain MRI showed large right facial mass with right maxillary erosion involving the maxillary sinus, hard palate and pterygoid plates and muscles.  Perineural spreading along the multiple branches of the maxillary division of the right trigeminal nerve at the infraorbital canal, pterygopalatine fossa and foramen rotundum. 07/17/2020, PET scan showed Intense FDG uptake is associated with the large enhancing tumor which involves the right maxilla, right nasal cavity, right pterygoid palatine fossa, right orbital apex and right soft tissue palate and palatine tonsil. 2. Mild nonspecific FDG uptake is associated with the recently characterized suspicious right level 1A lymph node which measures 7 mm and has an SUV max of 2.12.  No signs of distant metastasis.  Aortic atherosclerosis.  Patient also reports a history of hypertension, dysrhythmia, history of V. tach.  Patient previously was on lisinopril, atenolol, amlodipine for which she is no longer taking at this point due to lack of medication coverage.  She also does not have any primary care provider. 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  On average she says she is takes 2 to 3 pills/day.  She also takes gabapentin 300 mg 3 times daily  # new onset of atrial fibrillation with rapid ventricular  response.  Patient was started on metoprolol.  Eliquis for anticoagulation. INTERVAL HISTORY Shannon Schroeder is a 64 y.o. female who has above history reviewed by me today presents for follow up visit for management of head and neck cancer, post  hospitalization follow up  Problems and complaints are listed below:  Patient was recently admitted due to A. fib, hypotension, multifocal pneumonia.  Patient was treated with IV antibiotics.  Cardiology saw the patient during admission and added amiodarone.  Patient was discharged on 5 days course of oral antibiotics, continued on metoprolol and amiodarone for atrial fibrillation. Today patient reports feeling well.  Appetite is increased.  No coughing no shortness of breath. Dysphagia is a persistent problem for her.  She cannot swallow potassium tablets and has to crush them.  Does not want to have PEG tube placed.  No nausea vomiting diarrhea. She has gained 16 pounds since last visit. Right facial pain is partially controlled.  She does not take Percocet as often as frequently however when she has the pain, she has to take 2 Percocet to control the pain.   Review of Systems  Constitutional: Positive for fatigue. Negative for appetite change, chills, fever and unexpected weight change.  HENT:   Negative for hearing loss, mouth sores and voice change.        Facial swelling, pain and numbness  Eyes: Negative for eye problems.  Respiratory: Negative for chest tightness, cough and shortness of breath.   Cardiovascular: Negative for chest pain and palpitations.  Gastrointestinal: Negative for abdominal distention, abdominal pain, blood in stool, constipation, nausea and vomiting.       Dysphagia  Endocrine: Negative for hot flashes.  Genitourinary: Negative for difficulty urinating and frequency.   Musculoskeletal: Negative for arthralgias.  Skin: Negative for itching and rash.  Neurological: Negative for extremity weakness and headaches.  Hematological: Negative for adenopathy.  Psychiatric/Behavioral: Negative for confusion.    MEDICAL HISTORY:  Past Medical History:  Diagnosis Date  . 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   Marland Kitchen Anxiety    . Arthritis   . Bipolar disorder ([place])   . COPD (chronic obstructive pulmonary disease) ([place])    NO INHALERS  . Depression   . Dysrhythmia    H/O V TACH  . Head and neck cancer ([place]) 07/29/2020  . Headache    H/O MIGRAINES  . Hypertension   . Seizures ([place])    X1 AFTER FALL    SURGICAL HISTORY: Past Surgical History:  Procedure Laterality Date  . ABDOMINAL HYSTERECTOMY     partial  . BREAST SURGERY     biopsy  . CARDIAC CATHETERIZATION     X 2  . CARPAL TUNNEL RELEASE Right   . CARPECTOMY HAND Right   . CATARACT EXTRACTION W/PHACO Left 03/08/2018   Procedure: CATARACT EXTRACTION PHACO AND INTRAOCULAR LENS PLACEMENT (IOC);  Surgeon: Birder Robson, MD;  Location: [facility] ORS;  Service: Ophthalmology;  Laterality: Left;  [place] 00:55 AP% 13.5 CDE 7.52 Fluid pack lot # [id number]  . CATARACT EXTRACTION W/PHACO Right 04/05/2018   Procedure: CATARACT EXTRACTION PHACO AND INTRAOCULAR LENS PLACEMENT (IOC);  Surgeon: Birder Robson, MD;  Location: [facility] ORS;  Service: Ophthalmology;  Laterality: Right;  [place] 00:36 AP% 15.9 CDE 5.72 Fluid pack lot # [id number]  . CEREBRAL ANEURYSM REPAIR    . CHOLECYSTECTOMY    . COLONOSCOPY WITH PROPOFOL N/A 04/26/2019   Procedure: COLONOSCOPY WITH PROPOFOL;  Surgeon: Jonathon Bellows, MD;  Location: [facility] ENDOSCOPY;  Service: Gastroenterology;  Laterality: N/A;  . PORTACATH PLACEMENT Left 08/05/2020   Procedure: INSERTION PORT-A-CATH;  Surgeon: Herbert Pun, MD;  Location: [facility] ORS;  Service: General;  Laterality: Left;  . TUBAL LIGATION      SOCIAL HISTORY: Social History   Socioeconomic History  . Marital status: Divorced    Spouse name: Not on file  . Number of children: Not on file  . Years of education: 12  . Highest education level: Bachelor's degree (e.g., BA, AB, BS)  Occupational History  . Occupation: farmer  Tobacco Use  . Smoking status: Current Every Day Smoker    Packs/day: 1.00    Years: 50.00    Pack years: 50.00    Types:  Cigarettes  . Smokeless tobacco: Never Used  Vaping Use  . Vaping Use: Never used  Substance and Sexual Activity  . Alcohol use: No  . Drug use: No  . Sexual activity: Not Currently  Other Topics Concern  . Not on file  Social History Narrative   Pt lives at [facility].    Social Determinants of Health   Financial Resource Strain:   . Difficulty of Paying Living Expenses: Not on file  Food Insecurity:   . Worried About Charity fundraiser in the Last Year: Not on file  . Ran Out of Food in the Last Year: Not on file  Transportation Needs:   . Lack of Transportation (Medical): Not on file  . Lack of Transportation (Non-Medical): Not on file  Physical Activity:   . Days of Exercise per Week: Not on file  . Minutes of Exercise per Session: Not on file  Stress:   . Feeling of Stress : Not on file  Social Connections:   . Frequency of Communication with Friends and Family: Not on file  . Frequency of Social Gatherings with Friends and Family: Not on file  . Attends Religious Services: Not on file  . Active Member of Clubs or Organizations: Not on file  . Attends Archivist Meetings: Not on file  . Marital Status: Not on file  Intimate Partner Violence:   . Fear of Current or Ex-Partner: Not on file  . Emotionally Abused: Not on file  . Physically Abused: Not on file  . Sexually Abused: Not on file    FAMILY HISTORY: Family History  Problem Relation Age of Onset  . Hypertension Mother   . Heart failure Mother   . Hypertension Brother   . Stroke Brother   . Hypertension Son   . Emphysema Maternal Aunt   . Hypertension Paternal Aunt   . Hypertension Paternal Uncle   . Hypertension Maternal Grandmother   . Hypertension Paternal Grandmother     ALLERGIES:  is allergic to aspirin, belladonna alkaloids, fluoxetine hcl, naproxen, phenobarbital, and prozac [fluoxetine hcl].  MEDICATIONS:  Current Outpatient Medications  Medication Sig Dispense Refill    . acetaminophen (TYLENOL 8 HOUR ARTHRITIS PAIN) 650 MG CR tablet Take 1,300 mg by mouth daily as needed (Pain).  (Patient not taking: Reported on 09/02/2020)    . acyclovir (ZOVIRAX) 400 MG tablet Take 1 tablet (400 mg total) by mouth 2 (two) times daily. 60 tablet 0  . amiodarone (PACERONE) 200 MG tablet Take 2 tablets (400 mg total) by mouth 2 (two) times daily for 7 days, THEN 1 tablet (200 mg total) 2 (two) times daily for 7 days, THEN 1 tablet (200 mg total) daily. 118 tablet 0  .  apixaban (ELIQUIS) 5 MG TABS tablet Take 1 tablet (5 mg total) by mouth 2 (two) times daily. 60 tablet 0  . chlorhexidine (PERIDEX) 0.12 % solution Use as directed 10 mLs in the mouth or throat 2 (two) times daily. Swish and spit 473 mL 1  . dexamethasone (DECADRON) 4 MG tablet Take 1 tablet (4 mg total) by mouth daily. 7 tablet 0  . docusate sodium (COLACE) 100 MG capsule Take 1 capsule (100 mg total) by mouth daily. 30 capsule 1  . feeding supplement, ENSURE ENLIVE, (ENSURE ENLIVE) LIQD Take 237 mLs by mouth 3 (three) times daily between meals. 237 mL 12  . fentaNYL (DURAGESIC) 25 MCG/HR Place 1 patch onto the skin every 3 (three) days. 7 patch 0  . gabapentin (NEURONTIN) 300 MG capsule Take 1 capsule (300 mg total) by mouth 3 (three) times daily. 90 capsule 0  . lidocaine-prilocaine (EMLA) cream Apply to affected area once (Patient taking differently: Apply 1 application topically as needed [facility]). Apply to affected area once) 30 g 3  . Melatonin 10 MG TABS Take 10 mg by mouth at bedtime as needed (sleep).     . metoprolol tartrate (LOPRESSOR) 25 MG tablet Take 1 tablet (25 mg total) by mouth 2 (two) times daily. 180 tablet 1  . Multiple Vitamins-Minerals (CENTRUM SILVER PO) Take 1 tablet by mouth daily. Gummie    . nicotine (NICODERM CQ - DOSED IN MG/24 HOURS) 21 mg/24hr patch Place 1 patch (21 mg total) onto the skin daily. (Patient not taking: Reported on 08/19/2020) 28 patch 0  . oxyCODONE-acetaminophen  (PERCOCET) 5-325 MG tablet Take 1 tablet by mouth every 4 (four) hours as needed for severe pain. 30 tablet 0  . polyethylene glycol powder (GLYCOLAX/MIRALAX) 17 GM/SCOOP powder Take 17 g by mouth daily as needed for mild constipation or moderate constipation. 255 g 0  . potassium chloride SA (KLOR-CON) 20 MEQ tablet Take 1 tablet (20 mEq total) by mouth daily. 30 tablet 0  . prochlorperazine (COMPAZINE) 10 MG tablet Take 1 tablet (10 mg total) by mouth every 6 (six) hours as needed (Nausea or vomiting). 30 tablet 1   No current facility-administered medications for this visit.   Facility-Administered Medications Ordered in Other Visits  Medication Dose Route Frequency Provider Last Rate Last Admin  . heparin lock flush 100 unit/mL  500 Units Intravenous Once Earlie Server, MD      . sodium chloride flush (NS) 0.9 % injection 10 mL  10 mL Intravenous PRN Earlie Server, MD   10 mL at 09/16/20 0816     PHYSICAL EXAMINATION: ECOG PERFORMANCE STATUS: 1 - Symptomatic but completely ambulatory Vitals:   09/16/20 0842  BP: 137/75  Pulse: 61  Resp: 18  Temp: (!) 96.6 F (35.9 C)   Filed Weights   09/16/20 0842  Weight: 129 lb 11.2 oz (58.8 kg)    Physical Exam Constitutional:      General: She is not in acute distress. HENT:     Head: Normocephalic and atraumatic.     Nose:     Comments: Right facial swelling    Mouth/Throat:     Comments: No thrush.  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 Crusted lesion on her lower lip Eyes:     General: No scleral icterus. Cardiovascular:     Rate and Rhythm: Normal rate and regular rhythm.     Heart  sounds: Normal heart sounds.  Pulmonary:     Effort: Pulmonary effort is normal. No respiratory distress.     Breath sounds: No wheezing.  Abdominal:     General: Bowel sounds are normal. There is no distension.     Palpations: Abdomen is  soft.  Musculoskeletal:        General: No deformity. Normal range of motion.     Cervical back: Normal range of motion and neck supple.  Skin:    General: Skin is warm and dry.     Findings: No erythema or rash.  Neurological:     Mental Status: She is alert and oriented to person, place, and time. Mental status is at baseline.     Cranial Nerves: No cranial nerve deficit.     Coordination: Coordination normal.  Psychiatric:        Mood and Affect: Mood normal.     LABORATORY DATA:  I have reviewed the data as listed Lab Results  Component Value Date   WBC 4.3 09/16/2020   HGB 10.1 (L) 09/16/2020   HCT 30.2 (L) 09/16/2020   MCV 100.3 (H) 09/16/2020   PLT 274 09/16/2020   Recent Labs    07/10/20 1311 07/10/20 1311 07/12/20 1204 08/08/20 0821 09/04/20 1249 09/04/20 1249 09/06/20 1141 09/06/20 1141 09/06/20 1243 09/07/20 0802 09/09/20 2244 09/10/20 0353 09/11/20 0625  NA 136   < > 137   < > 136   < > 136   < > 140   < > 133* 134* 132*  K 2.9*   < > 4.0   < > 3.3*   < > 2.7*   < > 2.8*   < > 3.2* 4.3 3.5  CL 97*   < > 98   < > 99   < > 97*   < > 99   < > 97* 100 99  CO2 28   < > 27   < > 27   < > 27   < > 26   < > 26 29 28   GLUCOSE 100*   < > 114*   < > 163*   < > 133*   < > 107*   < > 94 82 89  BUN 13   < > 8   < > 18   < > 13   < > 12   < > 12 10 7*  CREATININE 0.43*   < > 0.42*   < > 0.48   < > 0.49   < > 0.39*   < > 0.44 0.43* <0.30*  CALCIUM 8.9   < > 9.0   < > 8.3*   < > 7.9*   < > 7.6*   < > 7.7* 8.0* 8.0*  GFRNONAA >60   < > >60   < > >60   < > >60   < > >60   < > >60 >60 NOT CALCULATED  GFRAA >60  --  >60  --   --   --   --   --   --   --   --   --   --   PROT 7.0  --   --    < > 5.8*  --  5.7*  --  5.6*  --   --   --   --   ALBUMIN 3.7  --   --    < > 2.7*  --  2.6*  --  2.6*  --   --   --   --  AST 20  --   --    < > 28  --  20  --  19  --   --   --   --   ALT 16  --   --    < > 17  --  17  --  16  --   --   --   --   ALKPHOS 106  --   --    < > 90  --   86  --  85  --   --   --   --   BILITOT 0.5  --   --    < > 0.5  --  0.5  --  0.7  --   --   --   --    < > = values in this interval not displayed.   Iron/TIBC/Ferritin/ %Sat    Component Value Date/Time   IRON 46 09/09/2020 0938   IRON 37 05/03/2018 1826   TIBC 210 (L) 09/09/2020 0938   FERRITIN 136 09/09/2020 0938   FERRITIN 52 05/03/2018 1826   IRONPCTSAT 22 09/09/2020 0938      RADIOGRAPHIC STUDIES: I have personally reviewed the radiological images as listed and agreed with the findings in the report. CT Chest W Contrast  Result Date: 09/06/2020 CLINICAL DATA:  Follow-up abnormal chest x-ray EXAM: CT CHEST WITH CONTRAST TECHNIQUE: Multidetector CT imaging of the chest was performed during intravenous contrast administration. CONTRAST:  78mL OMNIPAQUE IOHEXOL 300 MG/ML  SOLN COMPARISON:  Chest x-ray from earlier in the same day and CT from 08/08/2020. FINDINGS: Cardiovascular: Thoracic aorta demonstrates atherosclerotic calcifications. No dissection is noted. No aneurysmal dilatation is seen. The pulmonary artery as visualized shows no pulmonary embolism although not timed for true embolus evaluation. No cardiac enlargement is seen. No coronary calcifications are noted. Left-sided chest wall port is noted in satisfactory position. Mediastinum/Nodes: Thoracic inlet is within normal limits. Scattered right hilar and subcarinal lymph nodes are seen. The largest of the hilar lymph nodes measures approximately 13 mm in short axis. Subcarinal node measures 10 mm in short axis. The esophagus as visualized is within normal limits. Lungs/Pleura: Lungs are well aerated bilaterally. The previously seen chest x-ray abnormality lies in the posterior aspect of the right upper lobe and is pleural base consistent with acute pneumonia. Patchy similar infiltrates are noted in the right middle lobe as well as the lingula on the left. The appearance of cavitary component seen on prior plain film is not borne  out on the CT and likely projectional in nature. No sizable parenchymal nodule is seen. No effusion or pneumothorax is noted. Upper Abdomen: Visualized upper abdomen shows mild fatty infiltration of the liver. Musculoskeletal: Degenerative changes of the thoracic spine are noted. No acute rib abnormality is noted. No compression deformity is seen. IMPRESSION: Patchy multifocal infiltrate worst in the right upper lobe posteriorly and laterally consistent with that seen on recent plain film examination. This is new from the prior CT consistent with multifocal pneumonia. Followup PA and lateral chest X-ray is recommended in 3-4 weeks following trial of antibiotic therapy to ensure resolution. Fatty liver. No other focal abnormality is noted. Aortic Atherosclerosis (ICD10-I70.0). Electronically Signed   By: Inez Catalina M.D.   On: 09/06/2020 16:12   DG Chest Portable 1 View  Result Date: 09/06/2020 CLINICAL DATA:  Weakness.  Hypotension. EXAM: PORTABLE CHEST 1 VIEW COMPARISON:  CT 08/08/2020.  Chest x-ray 08/08/2020. FINDINGS: PowerPort catheter noted with tip over  superior vena cava. Cardiomegaly. No pulmonary venous congestion. Multifocal right lung infiltrates noted. Tiny cavitary infiltrate right upper lung cannot be excluded. Mild left base atelectasis scratched it mild bibasilar atelectasis. No pleural effusion or pneumothorax. IMPRESSION: 1. PowerPort catheter with tip over superior vena cava. 2. Multifocal right lung infiltrates noted. Tiny cavitary infiltrate right upper lung cannot be excluded. Electronically Signed   By: Marcello Moores  Register   On: 09/06/2020 13:26      ASSESSMENT & PLAN:  1. 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[place])   2. Encounter for antineoplastic chemotherapy   3. History of atrial fibrillation   4. 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  Labs are reviewed and discussed with patient. She has been doing better since admission for multifocal pneumonia Counts  are stable to proceed with cisplatin today. Recommend patient continue mouthwash, acyclovir 400 mg twice a day for HSV prophylaxis.  #Hypokalemia, potassium is 3 today .  Patient will receive 20 mEq of potassium today.  I asked patient to continue potassium chloride 20 mEq daily.  She may crush the pills and mix with applesauce.  Discussed with patient.  #Hypomagnesia, magnesium is 1.7 today.  Advised patient to continue Slow-Mag 64 mg daily. #Poor oral intake, dysphagia Patient has IV fluid normal saline 1 L prior to the cisplatin treatment. We will schedule patient on 1 additional hydration session this week.Marland Kitchen  She will also repeat BMP and magnesium today.  #Atrial fibrillation.  Continue amiodarone and metoprolol. #Blood pressure is better this week.  She is currently off lisinopril due to previously hypotensive.  Asked patient to further discuss with cardiology when she follows up   ##neoplasm induced pain, continue fentanyl patch 59mcg/h Q72 hours. .  Continue garbapentine 300mg  TID.  We will change her regimen to 2 Percocet tablets every 8 hours as needed for pain.  Prescription sent to pharmacy.  # constipation, continue colace 100mg  daily and use miralax daily PRN # PNA, finished oral antibiotics. All questions were answered. The patient knows to call the clinic with any problems questions or concerns.   Return of visit: 1 week  Earlie Server, MD, PhD Hematology Oncology [facility] at [facility] Pager- [phone] 09/16/2020

## 2020-09-17 ENCOUNTER — Ambulatory Visit
Admission: RE | Admit: 2020-09-17 | Discharge: 2020-09-17 | Disposition: A | Discharge status: Home / Self Care | Payer: Medicaid Other | Source: Ambulatory Visit | Attending: Radiation Oncology | Admitting: Radiation Oncology

## 2020-09-17 DIAGNOSIS — Z510 Encounter for antineoplastic radiation therapy: Secondary | ICD-10-CM | POA: Diagnosis not present

## 2020-09-18 ENCOUNTER — Ambulatory Visit
Admission: RE | Admit: 2020-09-18 | Discharge: 2020-09-18 | Disposition: A | Discharge status: Home / Self Care | Payer: Medicaid Other | Source: Ambulatory Visit | Attending: Radiation Oncology | Admitting: Radiation Oncology

## 2020-09-18 DIAGNOSIS — Z510 Encounter for antineoplastic radiation therapy: Secondary | ICD-10-CM | POA: Diagnosis not present

## 2020-09-18 NOTE — Progress Notes (Signed)
Cardiology Office Note    Date:  09/20/2020   ID:  Shannon Schroeder, DOB 11/16/1955, MRN [id number]  PCP:  Pcp, No  Cardiologist:  Ida Rogue, MD  Electrophysiologist:  None   Chief Complaint: Hospital follow-up  History of Present Illness:   Shannon Schroeder is a 64 y.o. 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 from 11/5 through 11/10 for severe sepsis with multifocal PNA, Afib with RVR and SVT.  She was seen virtually by Dr. Fletcher Anon in 03/2019 for preoperative cardiac evaluation for colonoscopy. Echo at that time showed an EF of 60-65%, normal LV diastolic function, normal RVSF with normal ventricular cavity size, PASP 32 mmHg, and mildly to moderately dilated left atrium.   Available stress test from [facility] in 05/2009 showed a small, mildly severe reversible defect involving the mid anteroseptal wall consistent with probable attenuation artifact and/or possible ischemia. EF > 65%.   She was diagnosed with maxillary sinus cancer in 07/2020 and has been undergoing chemoradiation therapy.   She was recently admitted to the hospital in 08/2020, after being found to have tachycardia at the cancer center and being transferred to the ED, with new onset Afib with RVR of uncertain chronicity. She converted to sinus rhythm on diltiazem gtt and was placed on metoprolol and Eliquis. Echo during the admission showed an EF of 60-65%, no RWMA, mild LVH, Gr2DD, normal PASP, mild MR. CTA chest was negative for PE. She was noted to have a mild troponin elevated felt to be related to demand ischemia. No significant  coronary artery calcium was noted on CTA of the chest.   She was readmitted to the hospital on 11/5 after being sent over from the cancer center with weakness and hypotension. She was noted to be hypotensive in the 69S systolic and be in Afib with RVR upon arrival to the ED. She was admitted with multifocal PNA and treated with broad spectrum antibiotics. She was placed on a Cardizem gtt with improvement in her ventricular rates throughout her admission. Troponin peaked at 27 and was down trending. On the night of 11/8, she developed SVT with rates into the 170s bpm with noted drop in BP to the 85I systolic. With hypotension, she was given an bolus of normal saline along with a bolus of IV amiodarone and placed on an amiodarone gtt. With this, she converted to sinus rhythm, without significant post termination pause. With this, she remained in sinus rhythm and was placed on oral amiodarone in an effort to maintain sinus rhythm and decrease her atrial arrhythmia burden.  She comes in doing reasonably well from a cardiac perspective. Since her hospital discharge she denies any chest pain, dyspnea, palpitations, dizziness, presyncope, or syncope. She has tolerated the addition of amiodarone without issues. She does not have insurance and can no longer afford Eliquis. She has not yet heard back from submitted patient assistance paperwork. No falls, hematochezia, or melena. She does note her blood pressure has been increasing at some of her office visits. She does not have a BP cuff at home. She does note some intermittent lower extremity swelling that is typically worse if she is up  on her feet for prolonged timeframes and improved with leg elevation and laying supine overnight.    Labs independently reviewed: 09/2020 - magnesium 1.7, potassium 3.0, BUN 14, SCr 0.46, albumin 3.0, AST/ALT normal, HGB 10.1, PLT 274, TSH normal, A1c 5.4 08/2020 - TC 129, TG 87, HDL 52, LDL 60  Past Medical History:  Diagnosis  Date  . Aneurysm of anterior cerebral artery 06/29/2018   Receiving care and treatment at [facility].   Marland Kitchen Anxiety   . Arthritis   . Bipolar disorder ([place])   . COPD (chronic obstructive pulmonary disease) ([place])    NO INHALERS  . Depression   . Dysrhythmia    H/O V TACH  . Head and neck cancer ([place]) 07/29/2020  . Headache    H/O MIGRAINES  . Hypertension   . Seizures ([place])    X1 AFTER FALL    Past Surgical History:  Procedure Laterality Date  . ABDOMINAL HYSTERECTOMY     partial  . BREAST SURGERY     biopsy  . CARDIAC CATHETERIZATION     X 2  . CARPAL TUNNEL RELEASE Right   . CARPECTOMY HAND Right   . CATARACT EXTRACTION W/PHACO Left 03/08/2018   Procedure: CATARACT EXTRACTION PHACO AND INTRAOCULAR LENS PLACEMENT (IOC);  Surgeon: Birder Robson, MD;  Location: [facility] ORS;  Service: Ophthalmology;  Laterality: Left;  [place] 00:55 AP% 13.5 CDE 7.52 Fluid pack lot # [id number]  . CATARACT EXTRACTION W/PHACO Right 04/05/2018   Procedure: CATARACT EXTRACTION PHACO AND INTRAOCULAR LENS PLACEMENT (IOC);  Surgeon: Birder Robson, MD;  Location: [facility] ORS;  Service: Ophthalmology;  Laterality: Right;  [place] 00:36 AP% 15.9 CDE 5.72 Fluid pack lot # [id number]  . CEREBRAL ANEURYSM REPAIR    . CHOLECYSTECTOMY    . COLONOSCOPY WITH PROPOFOL N/A 04/26/2019   Procedure: COLONOSCOPY WITH PROPOFOL;  Surgeon: Jonathon Bellows, MD;  Location: [facility] ENDOSCOPY;  Service: Gastroenterology;  Laterality: N/A;  . PORTACATH PLACEMENT Left 08/05/2020   Procedure: INSERTION PORT-A-CATH;  Surgeon: Herbert Pun, MD;  Location: [facility] ORS;  Service: General;  Laterality: Left;  . TUBAL LIGATION      Current Medications: Current Meds  Medication Sig  . amiodarone (PACERONE) 200 MG tablet Take 1 tablet (200 mg total) by mouth daily.  Marland Kitchen apixaban (ELIQUIS) 5 MG TABS tablet Take 1 tablet (5 mg total) by mouth 2 (two) times daily.  . chlorhexidine (PERIDEX) 0.12 % solution Use as directed 10 mLs in the mouth or throat 2  (two) times daily. Swish and spit  . dexamethasone (DECADRON) 4 MG tablet Take 1 tablet (4 mg total) by mouth daily.  Marland Kitchen docusate sodium (COLACE) 100 MG capsule Take 1 capsule (100 mg total) by mouth daily.  . feeding supplement, ENSURE ENLIVE, (ENSURE ENLIVE) LIQD Take 237 mLs by mouth 3 (three) times daily between meals.  . fentaNYL (DURAGESIC) 25 MCG/HR Place 1 patch onto the skin every 3 (three) days.  Marland Kitchen gabapentin (NEURONTIN) 300 MG capsule Take 1 capsule (300 mg total) by mouth 3 (three) times daily.  Marland Kitchen lidocaine-prilocaine (EMLA) cream Apply to affected area once (Patient taking differently: Apply 1 application topically as needed [facility]). Apply to affected area once)  . magnesium chloride (SLOW-MAG) 64 MG TBEC SR tablet Take 1 tablet (64 mg total) by mouth daily.  . Melatonin 10 MG TABS Take 10 mg by mouth at bedtime as needed (sleep).   . Multiple Vitamins-Minerals (CENTRUM SILVER PO) Take 1 tablet by mouth daily. Gummie  . nicotine (NICODERM CQ -  DOSED IN MG/24 HOURS) 21 mg/24hr patch Place 1 patch (21 mg total) onto the skin daily.  Marland Kitchen oxyCODONE-acetaminophen (PERCOCET) 5-325 MG tablet Take 2 tablets by mouth every 8 (eight) hours as needed for severe pain.  . polyethylene glycol powder (GLYCOLAX/MIRALAX) 17 GM/SCOOP powder Take 17 g by mouth daily as needed for mild constipation or moderate constipation.  . potassium chloride SA (KLOR-CON) 20 MEQ tablet Take 1 tablet (20 mEq total) by mouth daily.  . prochlorperazine (COMPAZINE) 10 MG tablet Take 1 tablet (10 mg total) by mouth every 6 (six) hours as needed (Nausea or vomiting).  . [DISCONTINUED] amiodarone (PACERONE) 200 MG tablet Take 2 tablets (400 mg total) by mouth 2 (two) times daily for 7 days, THEN 1 tablet (200 mg total) 2 (two) times daily for 7 days, THEN 1 tablet (200 mg total) daily.  . [DISCONTINUED] metoprolol tartrate (LOPRESSOR) 25 MG tablet Take 1 tablet (25 mg total) by mouth 2 (two) times daily.    Allergies:    Aspirin, Belladonna alkaloids, Fluoxetine hcl, Naproxen, Phenobarbital, and Prozac [fluoxetine hcl]   Social History   Socioeconomic History  . Marital status: Divorced    Spouse name: Not on file  . Number of children: Not on file  . Years of education: 58  . Highest education level: Bachelor's degree (e.g., BA, AB, BS)  Occupational History  . Occupation: farmer  Tobacco Use  . Smoking status: Current Every Day Smoker    Packs/day: 0.25    Years: 50.00    Pack years: 12.50    Types: Cigarettes  . Smokeless tobacco: Never Used  Vaping Use  . Vaping Use: Never used  Substance and Sexual Activity  . Alcohol use: No  . Drug use: No  . Sexual activity: Not Currently  Other Topics Concern  . Not on file  Social History Narrative   Pt lives at [facility].    Social Determinants of Health   Financial Resource Strain:   . Difficulty of Paying Living Expenses: Not on file  Food Insecurity:   . Worried About Charity fundraiser in the Last Year: Not on file  . Ran Out of Food in the Last Year: Not on file  Transportation Needs:   . Lack of Transportation (Medical): Not on file  . Lack of Transportation (Non-Medical): Not on file  Physical Activity:   . Days of Exercise per Week: Not on file  . Minutes of Exercise per Session: Not on file  Stress:   . Feeling of Stress : Not on file  Social Connections:   . Frequency of Communication with Friends and Family: Not on file  . Frequency of Social Gatherings with Friends and Family: Not on file  . Attends Religious Services: Not on file  . Active Member of Clubs or Organizations: Not on file  . Attends Archivist Meetings: Not on file  . Marital Status: Not on file     Family History:  The patient's family history includes Emphysema in her maternal aunt; Heart failure in her mother; Hypertension in her brother, maternal grandmother, mother, paternal aunt, paternal grandmother, paternal uncle, and son; Stroke  in her brother.  ROS:   Review of Systems  Constitutional: Positive for malaise/fatigue. Negative for chills, diaphoresis, fever and weight loss.  HENT: Negative for congestion.   Eyes: Negative for discharge and redness.  Respiratory: Negative for cough, sputum production, shortness of breath and wheezing.   Cardiovascular: Positive for leg swelling. Negative for  chest pain, palpitations, orthopnea, claudication and PND.  Gastrointestinal: Negative for abdominal pain, blood in stool, heartburn, melena, nausea and vomiting.  Musculoskeletal: Negative for falls and myalgias.  Skin: Negative for rash.  Neurological: Positive for weakness. Negative for dizziness, tingling, tremors, sensory change, speech change, focal weakness and loss of consciousness.  Endo/Heme/Allergies: Does not bruise/bleed easily.  Psychiatric/Behavioral: Negative for substance abuse. The patient is not nervous/anxious.   All other systems reviewed and are negative.    EKGs/Labs/Other Studies Reviewed:    Studies reviewed were summarized above. The additional studies were reviewed today:  2D echo 08/09/2020: 1. Left ventricular ejection fraction, by estimation, is 60 to 65%. The  left ventricle has normal function. The left ventricle has no regional  wall motion abnormalities. There is mild left ventricular hypertrophy.  Left ventricular diastolic parameters  are consistent with Grade II diastolic dysfunction (pseudonormalization).  2. Right ventricular systolic function is normal. The right ventricular  size is normal. There is normal pulmonary artery systolic pressure. The  estimated right ventricular systolic pressure is 10.2 mmHg.  3. The mitral valve is normal in structure. Mild mitral valve  regurgitation.  __________  2D echo 04/07/2019: 1. The left ventricle has normal systolic function with an ejection  fraction of 60-65%. The cavity size was normal. Left ventricular diastolic  parameters were  normal.  2. The right ventricle has normal systolic function. The cavity was  normal. There is no increase in right ventricular wall thickness. Mildly  elevated RVSP, 32 mm Hg  3. Left atrial size was mild-moderately dilated.   EKG:  EKG is ordered today.  The EKG ordered today demonstrates marked sinus bradycardia, 44 bpm, unable to exclude prior septal infarct, no acute ST-T changes  Recent Labs: 08/08/2020: B Natriuretic Peptide 233.2 09/07/2020: TSH 3.199 09/16/2020: ALT 15; Hemoglobin 10.1; Platelets 274 09/19/2020: BUN 19; Creatinine, Ser 0.41; Magnesium 1.7; Potassium 3.7; Sodium 140  Recent Lipid Panel    Component Value Date/Time   CHOL 129 08/09/2020 0421   CHOL 180 12/21/2018 1041   CHOL 221 04/23/2015 0000   TRIG 87 08/09/2020 0421   TRIG 97 12/18/2016 0000   HDL 52 08/09/2020 0421   HDL 71 12/21/2018 1041   HDL 97 12/18/2016 0000   CHOLHDL 2.5 08/09/2020 0421   VLDL 17 08/09/2020 0421   VLDL 9 04/23/2015 0000   LDLCALC 60 08/09/2020 0421   LDLCALC 92 12/21/2018 1041   LDLCALC 108 04/23/2015 0000    PHYSICAL EXAM:    VS:  BP (!) 170/82 (BP Location: Left Arm, Patient Position: Sitting, Cuff Size: Normal)   Pulse (!) 44   Ht 5\' 6"  (1.676 m)   Wt 136 lb (61.7 kg)   LMP 11/19/1988 (Exact Date)   SpO2 98%   BMI 21.95 kg/m   BMI: Body mass index is 21.95 kg/m.  Physical Exam Constitutional:      Appearance: She is well-developed.  HENT:     Head: Normocephalic and atraumatic.  Eyes:     General:        Right eye: No discharge.        Left eye: No discharge.  Neck:     Vascular: No JVD.  Cardiovascular:     Rate and Rhythm: Regular rhythm. Bradycardia present.     Pulses: No midsystolic click and no opening snap.          Posterior tibial pulses are 2+ on the right side and 2+ on the left side.  Heart sounds: Normal heart sounds, S1 normal and S2 normal. Heart sounds not distant. No murmur heard.  No friction rub.  Pulmonary:     Effort:  Pulmonary effort is normal. No respiratory distress.     Breath sounds: Normal breath sounds. No decreased breath sounds, wheezing or rales.  Chest:     Chest wall: No tenderness.  Abdominal:     General: There is no distension.     Palpations: Abdomen is soft.     Tenderness: There is no abdominal tenderness.  Musculoskeletal:     Cervical back: Normal range of motion.  Skin:    General: Skin is warm and dry.     Nails: There is no clubbing.  Neurological:     Mental Status: She is alert and oriented to person, place, and time.  Psychiatric:        Speech: Speech normal.        Behavior: Behavior normal.        Thought Content: Thought content normal.        Judgment: Judgment normal.     Wt Readings from Last 3 Encounters:  09/20/20 136 lb (61.7 kg)  09/16/20 129 lb 11.2 oz (58.8 kg)  09/11/20 134 lb 12.8 oz (61.1 kg)     ASSESSMENT & PLAN:   1. Paroxysmal SVT/persistent A. Fib/sinus bradycardia: Maintaining sinus rhythm with a marked bradycardic heart rate. Stop metoprolol. Decrease amiodarone to 200 mg daily. She reports when she was previously on atenolol her heart rates were in the 50s bpm and she was asymptomatic with this. May need to decrease amiodarone further to 100 mg daily based on heart rates in follow-up. Samples were supplied for Eliquis. Our office will check on patient assistance paperwork. If she does not qualify for free or reduced Eliquis we may need to transition her to Xarelto to see if she qualifies otherwise we will likely need to place her on Coumadin. CHA2DS2-VASc 3. No symptoms concerning for bleeding with stable hemoglobin as outlined above.  2. Demand ischemia: Felt to be in the setting of A. fib with RVR with severe sepsis with pneumonia, hypotension, and anemia. Could consider outpatient ischemic evaluation down the road if indicated. This was not discussed in detail at today's visit given the above.  3. Reported history of VT: Details of this are  unclear. She remains on amiodarone as outlined above.  4. HTN: Blood pressure is elevated in the office today likely compensatory in the setting of her marked bradycardia. We have discontinued metoprolol as outlined above to assist with her bradycardia. In an effort to compensate for this we will have her resume lisinopril at prior dosage of 20 mg daily which she has at home.  5. Lower extremity swelling: Symptoms are most consistent with venous insufficiency. Recommend leg elevation. She was provided a prescription for Lasix to take 20 mg daily as needed worsening lower extremity swelling and advised to take this minimally in an effort to avoid significant dehydration/AKI.  Disposition: F/u with Dr. Rockey Situ or an APP in 2 months.   Medication Adjustments/Labs and Tests Ordered: Current medicines are reviewed at length with the patient today.  Concerns regarding medicines are outlined above. Medication changes, Labs and Tests ordered today are summarized above and listed in the Patient Instructions accessible in Encounters.   Signed, Christell Faith, PA-C 09/20/2020 12:12 PM     [address] [place], [place] [address] [phone]

## 2020-09-19 ENCOUNTER — Inpatient Hospital Stay: Payer: Medicaid Other

## 2020-09-19 ENCOUNTER — Ambulatory Visit
Admission: RE | Admit: 2020-09-19 | Discharge: 2020-09-19 | Disposition: A | Discharge status: Home / Self Care | Payer: Medicaid Other | Source: Ambulatory Visit | Attending: Radiation Oncology | Admitting: Radiation Oncology

## 2020-09-19 ENCOUNTER — Other Ambulatory Visit: Payer: Self-pay

## 2020-09-19 VITALS — BP 157/83 | HR 44 | Temp 98.0°F | Resp 16

## 2020-09-19 DIAGNOSIS — Z510 Encounter for antineoplastic radiation therapy: Secondary | ICD-10-CM | POA: Diagnosis not present

## 2020-09-19 DIAGNOSIS — E871 Hypo-osmolality and hyponatremia: Secondary | ICD-10-CM

## 2020-09-19 DIAGNOSIS — C310 Malignant neoplasm of maxillary sinus: Secondary | ICD-10-CM

## 2020-09-19 LAB — BASIC METABOLIC PANEL
Anion gap: 8 (ref 5–15)
BUN: 19 mg/dL (ref 8–23)
CO2: 28 mmol/L (ref 22–32)
Calcium: 8.4 mg/dL — ABNORMAL LOW (ref 8.9–10.3)
Chloride: 104 mmol/L (ref 98–111)
Creatinine, Ser: 0.41 mg/dL — ABNORMAL LOW (ref 0.44–1.00)
GFR, Estimated: >60 mL/min (ref >60)
Glucose, Bld: 97 mg/dL (ref 70–99)
Potassium: 3.7 mmol/L (ref 3.5–5.1)
Sodium: 140 mmol/L (ref 135–145)

## 2020-09-19 LAB — MAGNESIUM: Magnesium: 1.7 mg/dL (ref 1.7–2.4)

## 2020-09-19 MED ORDER — SODIUM CHLORIDE 0.9 % IV SOLN
Freq: Once | INTRAVENOUS | Status: AC
  Filled 2020-09-19: qty 250

## 2020-09-19 MED ORDER — HEPARIN SOD (PORK) LOCK FLUSH 100 UNIT/ML IV SOLN
INTRAVENOUS | Status: AC
  Filled 2020-09-19: qty 5

## 2020-09-19 MED ORDER — HEPARIN SOD (PORK) LOCK FLUSH 100 UNIT/ML IV SOLN
500.0000 [IU] | Freq: Once | INTRAVENOUS | Status: AC | PRN
  Administered 2020-09-19: 500 [IU]
  Filled 2020-09-19: qty 5

## 2020-09-19 NOTE — Progress Notes (Signed)
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  Patient receiving concurrent chemotherapy and radiation  Spoke with patient during IV fluids.  Patient reports that she is eating better and has started pureeing some foods. Planning to puree ribs tonight for dinner.  Has been eating eggs, grits, soup.  Drinking ensure plus shakes again and not having diarrhea.  Drinking about 2-3 shakes per day.      Medications: reviewed  Labs: reviewed  Anthropometrics:   Weight 129 lb 11.2 oz on 11/15 (pt reports fluid gain)  113 lb on 11/1 120 lb on 10/25 124 lb 8 oz on 10/11 132 lb 3 oz on 9/23   NUTRITION DIAGNOSIS: Inadequate oral intake improving   INTERVENTION:  Encouraged patient to continue modifying textures of food for ease of swallowing and chewing.   Encouraged high protein and high calorie foods.  Continue oral nutrition supplements as able. Declined needing ensure shakes today.  Patient has contact information    MONITORING, EVALUATION, GOAL: weight trends, intake   NEXT VISIT: Dec 2 phone f/u  Hayleigh Bawa B. Zenia Resides, [place], [place] Registered Dietitian [phone] (mobile)

## 2020-09-19 NOTE — Progress Notes (Signed)
Dr. Tasia Catchings made aware of HR 48. Patient denies any s/s at this time and reports this can be normal for her at times. Per Dr. Tasia Catchings, patient will only need 1L IVFs today.   Patient tolerated infusion well. Final HR 44. Patient denies any s/s at this time. Dr. Tasia Catchings made aware. Okay to discharge home. Educated patient on s/s as to when to seek emergency care. Patient verbalizes understanding. Discharged home.

## 2020-09-20 ENCOUNTER — Ambulatory Visit (INDEPENDENT_AMBULATORY_CARE_PROVIDER_SITE_OTHER): Payer: Self-pay | Admitting: Physician Assistant

## 2020-09-20 ENCOUNTER — Encounter: Payer: Self-pay | Admitting: Oncology

## 2020-09-20 ENCOUNTER — Encounter: Payer: Self-pay | Admitting: Physician Assistant

## 2020-09-20 ENCOUNTER — Ambulatory Visit
Admission: RE | Admit: 2020-09-20 | Discharge: 2020-09-20 | Disposition: A | Discharge status: Home / Self Care | Payer: Medicaid Other | Source: Ambulatory Visit | Attending: Radiation Oncology | Admitting: Radiation Oncology

## 2020-09-20 ENCOUNTER — Telehealth: Payer: Self-pay | Admitting: *Deleted

## 2020-09-20 VITALS — BP 170/82 | HR 44 | Ht 66.0 in | Wt 136.0 lb

## 2020-09-20 DIAGNOSIS — I471 Supraventricular tachycardia: Secondary | ICD-10-CM

## 2020-09-20 DIAGNOSIS — Z8679 Personal history of other diseases of the circulatory system: Secondary | ICD-10-CM

## 2020-09-20 DIAGNOSIS — I872 Venous insufficiency (chronic) (peripheral): Secondary | ICD-10-CM

## 2020-09-20 DIAGNOSIS — I10 Essential (primary) hypertension: Secondary | ICD-10-CM

## 2020-09-20 DIAGNOSIS — Z510 Encounter for antineoplastic radiation therapy: Secondary | ICD-10-CM | POA: Diagnosis not present

## 2020-09-20 DIAGNOSIS — I480 Paroxysmal atrial fibrillation: Secondary | ICD-10-CM

## 2020-09-20 DIAGNOSIS — R001 Bradycardia, unspecified: Secondary | ICD-10-CM

## 2020-09-20 DIAGNOSIS — I248 Other forms of acute ischemic heart disease: Secondary | ICD-10-CM

## 2020-09-20 MED ORDER — AMIODARONE HCL 200 MG PO TABS
200.0000 mg | ORAL_TABLET | Freq: Every day | ORAL | 1 refills | Status: DC
Start: 2020-09-20 — End: 2021-03-19

## 2020-09-20 MED ORDER — FUROSEMIDE 20 MG PO TABS: 20.0000 mg | ORAL_TABLET | Freq: Every day | ORAL | 1 refills | Status: DC | PRN

## 2020-09-20 MED ORDER — LISINOPRIL 20 MG PO TABS: 20.0000 mg | ORAL_TABLET | Freq: Every day | ORAL | Status: DC

## 2020-09-20 NOTE — Patient Instructions (Addendum)
Medication Instructions:  Your physician has recommended you make the following change in your medication:   1) STOP Metoprolol  2) DECREASE Amiodarone - amiodarone (PACERONE) 200 MG tablet ONE TABLET  DAILY  3) START  furosemide (LASIX) 20 MG tablet DAILY AS NEEDED for lower extremity     swelling  *If you need a refill on your cardiac medications before your next appointment, please call your pharmacy*   Lab Work: None ordered    Testing/Procedures: None ordered   Follow-Up: At [facility], you and your health needs are our priority.  As part of our continuing mission to provide you with exceptional heart care, we have created designated Provider Care Teams.  These Care Teams include your primary Cardiologist (physician) and Advanced Practice Providers (APPs -  Physician Assistants and Nurse Practitioners) who all work together to provide you with the care you need, when you need it.  We recommend signing up for the patient portal called "MyChart".  Sign up information is provided on this After Visit Summary.  MyChart is used to connect with patients for Virtual Visits (Telemedicine).  Patients are able to view lab/test results, encounter notes, upcoming appointments, etc.  Non-urgent messages can be sent to your provider as well.   To learn more about what you can do with MyChart, go to [email or web address].    Your next appointment:   2 month(s)  The format for your next appointment:   In Person  Provider:   You may see Ida Rogue, MD or one of the following Advanced Practice Providers on your designated Care Team:    Murray Hodgkins, NP  Christell Faith, PA-C  Marrianne Mood, PA-C  Cadence [place], [place]  Laurann [place], NP    Other Instructions  We will follow up regarding your patient assistance application for Eliquis. We have provided samples today in the meantime.

## 2020-09-20 NOTE — Progress Notes (Signed)
Pt prescreened for appointment. No new concerns voiced. Pt states she saw cardiology on 11/19 and they restarted her on lisinpril 20 mg.

## 2020-09-20 NOTE — Telephone Encounter (Signed)
Pt in office today asking that we follow up on her Eliquis pt assistance form that she submitted previously. I contacted [facility] and they do not have record of receiving. Spoke to pt and notified that I would mail her a new application and she may turn back in when completed and I will fax with provider's portion. Pt prefers I mail to her and says she will return to front desk when she comes for another appt at the hospital. She is a pt of Dr. Rockey Situ and will need his signature for provider portion (FYI).

## 2020-09-23 ENCOUNTER — Inpatient Hospital Stay: Payer: Medicaid Other

## 2020-09-23 ENCOUNTER — Ambulatory Visit
Admission: RE | Admit: 2020-09-23 | Discharge: 2020-09-23 | Disposition: A | Discharge status: Home / Self Care | Payer: Medicaid Other | Source: Ambulatory Visit | Attending: Radiation Oncology | Admitting: Radiation Oncology

## 2020-09-23 ENCOUNTER — Inpatient Hospital Stay (HOSPITAL_BASED_OUTPATIENT_CLINIC_OR_DEPARTMENT_OTHER): Payer: Medicaid Other | Admitting: Oncology

## 2020-09-23 VITALS — BP 146/80 | HR 50 | Temp 96.5°F | Resp 16 | Wt 134.6 lb

## 2020-09-23 DIAGNOSIS — R634 Abnormal weight loss: Secondary | ICD-10-CM

## 2020-09-23 DIAGNOSIS — C310 Malignant neoplasm of maxillary sinus: Secondary | ICD-10-CM

## 2020-09-23 DIAGNOSIS — Z5111 Encounter for antineoplastic chemotherapy: Secondary | ICD-10-CM

## 2020-09-23 DIAGNOSIS — G893 Neoplasm related pain (acute) (chronic): Secondary | ICD-10-CM

## 2020-09-23 DIAGNOSIS — E876 Hypokalemia: Secondary | ICD-10-CM

## 2020-09-23 DIAGNOSIS — Z510 Encounter for antineoplastic radiation therapy: Secondary | ICD-10-CM | POA: Diagnosis not present

## 2020-09-23 DIAGNOSIS — C760 Malignant neoplasm of head, face and neck: Secondary | ICD-10-CM

## 2020-09-23 DIAGNOSIS — Z8679 Personal history of other diseases of the circulatory system: Secondary | ICD-10-CM

## 2020-09-23 LAB — CBC WITH DIFFERENTIAL/PLATELET
Abs Immature Granulocytes: 0.01 10*3/uL (ref 0.00–0.07)
Basophils Absolute: 0 10*3/uL (ref 0.0–0.1)
Basophils Relative: 1 %
Eosinophils Absolute: 0.1 10*3/uL (ref 0.0–0.5)
Eosinophils Relative: 3 %
HCT: 28.3 % — ABNORMAL LOW (ref 36.0–46.0)
Hemoglobin: 9.5 g/dL — ABNORMAL LOW (ref 12.0–15.0)
Immature Granulocytes: 0 %
Lymphocytes Relative: 11 %
Lymphs Abs: 0.3 10*3/uL — ABNORMAL LOW (ref 0.7–4.0)
MCH: 33.6 pg (ref 26.0–34.0)
MCHC: 33.6 g/dL (ref 30.0–36.0)
MCV: 100 fL (ref 80.0–100.0)
Monocytes Absolute: 0.3 10*3/uL (ref 0.1–1.0)
Monocytes Relative: 11 %
Neutro Abs: 1.9 10*3/uL (ref 1.7–7.7)
Neutrophils Relative %: 74 %
Platelets: 208 10*3/uL (ref 150–400)
RBC: 2.83 MIL/uL — ABNORMAL LOW (ref 3.87–5.11)
RDW: 15.6 % — ABNORMAL HIGH (ref 11.5–15.5)
WBC: 2.6 10*3/uL — ABNORMAL LOW (ref 4.0–10.5)
nRBC: 0 % (ref 0.0–0.2)

## 2020-09-23 LAB — COMPREHENSIVE METABOLIC PANEL
ALT: 16 U/L (ref 0–44)
AST: 17 U/L (ref 15–41)
Albumin: 3 g/dL — ABNORMAL LOW (ref 3.5–5.0)
Alkaline Phosphatase: 68 U/L (ref 38–126)
Anion gap: 11 (ref 5–15)
BUN: 5 mg/dL — ABNORMAL LOW (ref 8–23)
CO2: 28 mmol/L (ref 22–32)
Calcium: 8.2 mg/dL — ABNORMAL LOW (ref 8.9–10.3)
Chloride: 99 mmol/L (ref 98–111)
Creatinine, Ser: 0.44 mg/dL (ref 0.44–1.00)
GFR, Estimated: >60 mL/min (ref >60)
Glucose, Bld: 98 mg/dL (ref 70–99)
Potassium: 2.9 mmol/L — ABNORMAL LOW (ref 3.5–5.1)
Sodium: 138 mmol/L (ref 135–145)
Total Bilirubin: 0.8 mg/dL (ref 0.3–1.2)
Total Protein: 5.4 g/dL — ABNORMAL LOW (ref 6.5–8.1)

## 2020-09-23 LAB — MAGNESIUM: Magnesium: 1.7 mg/dL (ref 1.7–2.4)

## 2020-09-23 MED ORDER — HEPARIN SOD (PORK) LOCK FLUSH 100 UNIT/ML IV SOLN
INTRAVENOUS | Status: AC
  Filled 2020-09-23: qty 5

## 2020-09-23 MED ORDER — SODIUM CHLORIDE 0.9 % IV SOLN
40.0000 mg/m2 | Freq: Once | INTRAVENOUS | Status: AC
  Administered 2020-09-23: 66 mg via INTRAVENOUS
  Filled 2020-09-23: qty 66

## 2020-09-23 MED ORDER — SODIUM CHLORIDE 0.9 % IV SOLN
10.0000 mg | Freq: Once | INTRAVENOUS | Status: AC
  Administered 2020-09-23: 10 mg via INTRAVENOUS
  Filled 2020-09-23: qty 10

## 2020-09-23 MED ORDER — HEPARIN SOD (PORK) LOCK FLUSH 100 UNIT/ML IV SOLN
500.0000 [IU] | Freq: Once | INTRAVENOUS | Status: AC | PRN
  Administered 2020-09-23: 500 [IU]
  Filled 2020-09-23: qty 5

## 2020-09-23 MED ORDER — PALONOSETRON HCL INJECTION 0.25 MG/5ML
0.2500 mg | Freq: Once | INTRAVENOUS | Status: AC
  Administered 2020-09-23: 0.25 mg via INTRAVENOUS
  Filled 2020-09-23: qty 5

## 2020-09-23 MED ORDER — SODIUM CHLORIDE 0.9 % IV SOLN
150.0000 mg | Freq: Once | INTRAVENOUS | Status: AC
  Administered 2020-09-23: 150 mg via INTRAVENOUS
  Filled 2020-09-23: qty 5

## 2020-09-23 MED ORDER — SODIUM CHLORIDE 0.9% FLUSH
10.0000 mL | Freq: Once | INTRAVENOUS | Status: AC
  Administered 2020-09-23: 10 mL via INTRAVENOUS
  Filled 2020-09-23: qty 10

## 2020-09-23 MED ORDER — SODIUM CHLORIDE 0.9 % IV SOLN
Freq: Once | INTRAVENOUS | Status: AC
  Filled 2020-09-23: qty 1000

## 2020-09-23 MED ORDER — POTASSIUM CHLORIDE CRYS ER 20 MEQ PO TBCR
40.0000 meq | EXTENDED_RELEASE_TABLET | Freq: Every day | ORAL | 0 refills | Status: DC
Start: 2020-09-23 — End: 2020-10-07

## 2020-09-23 MED ORDER — SODIUM CHLORIDE 0.9 % IV SOLN
Freq: Once | INTRAVENOUS | Status: AC
  Filled 2020-09-23: qty 250

## 2020-09-23 NOTE — Progress Notes (Signed)
Hematology/Oncology follow up note [facility] [phone] [phone]   Patient Care Team: Pcp, No as PCP - General Rockey Situ, Kathlene November, MD as PCP - Cardiology (Cardiology) Earlie Server, MD as Consulting Physician (Oncology)  CHIEF COMPLAINTS/REASON FOR VISIT:  Follow-up for head and neck cancer  HISTORY OF PRESENTING ILLNESS:   Shannon Schroeder is a  64 y.o.  female with PMH listed below was seen in consultation at the request of ER Dr. [name] for evaluation of abnormal CT scan.  07/10/2020 she presented emergency room for evaluation of right-sided facial swelling and pain in the right side of her mouth for the past 6 weeks.  It started in her mouth with a small ulcer which progressively got worse.  She wears denture.  Not able to chew well.  She eats soft food. She was found to have right upper gum also with yellowish discharge noted. 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  Avoid piloting.,  Right orbital apex, also the right soft palate and palate and tonsil.  Furthermore there is evidence of intracranial extension along the right V2 at the foramen rotundum Small suspicious right level 1A lymph node measuring 7 mm.  Separate small indeterminate but suspicious enhancing soft tissue nodule of the left sublingual space.  Patient was referred to oncology for further evaluation management. Patient denies fever, chills, nausea vomiting diarrhea shortness of breath or cough.  Reports 10 out of 10 severe pain of right face and numbness.  She also has headache. Reports unintentional weight loss  She reports no contactable family members.  She is in the process of pointing her roommate [name] to be her power of attorney.  She also wants to record today's conversation for her roommate  [name]. Patient has 50-pack-year smoking history.  # seen by ENT Dr.Juengel and had a biopsy. Biopsy was sent to [facility] and the final pathology was positive for invasive squamous cell carcinoma, well-differentiated, keratinizing. 07/17/2020, brain MRI showed large right facial mass with right maxillary erosion involving the maxillary sinus, hard palate and pterygoid plates and muscles.  Perineural spreading along the multiple branches of the maxillary division of the right trigeminal nerve at the infraorbital canal, pterygopalatine fossa and foramen rotundum. 07/17/2020, PET scan showed Intense FDG uptake is associated with the large enhancing tumor which involves the right maxilla, right nasal cavity, right pterygoid palatine fossa, right orbital apex and right soft tissue palate and palatine tonsil. 2. Mild nonspecific FDG uptake is associated with the recently characterized suspicious right level 1A lymph node which measures 7 mm and has an SUV max of 2.12.  No signs of distant metastasis.  Aortic atherosclerosis.  Patient also reports a history of hypertension, dysrhythmia, history of V. tach.  Patient previously was on lisinopril, atenolol, amlodipine for which she is no longer taking at this point due to lack of medication coverage.  She also does not have any primary care provider. 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  On average she says she is takes 2 to 3 pills/day.  She also takes gabapentin 300 mg 3 times daily  # new onset of atrial fibrillation with rapid ventricular  response.  Patient was started on metoprolol.  Eliquis for anticoagulation.  # 09/06/2020- 09/11/2020 admitted due to A. fib, hypotension, multifocal pneumonia.  Patient was treated with IV antibiotics.  Cardiology saw the patient during admission and  added amiodarone.  Patient was discharged on 5 days course of oral antibiotics, continued on metoprolol and amiodarone for atrial fibrillation.  # Dysphagia, she declined PET tube placment.  INTERVAL HISTORY Shannon Schroeder is a 64 y.o. female who has above history reviewed by me today presents for follow up visit for management of head and neck cancer, post hospitalization follow up  Problems and complaints are listed below:  She feels ok. No nausea, vomiting diarrhea. No fever chills, coughing Weight is stable. She has dysphagia, she tries to eat soft and semi liquid diet.   Right facial pain is partially controlled.     Review of Systems  Constitutional: Positive for fatigue. Negative for appetite change, chills, fever and unexpected weight change.  HENT:   Negative for hearing loss, mouth sores and voice change.        Facial swelling, pain and numbness  Eyes: Negative for eye problems.  Respiratory: Negative for chest tightness, cough and shortness of breath.   Cardiovascular: Negative for chest pain and palpitations.  Gastrointestinal: Negative for abdominal distention, abdominal pain, blood in stool, constipation, nausea and vomiting.       Dysphagia  Endocrine: Negative for hot flashes.  Genitourinary: Negative for difficulty urinating and frequency.   Musculoskeletal: Negative for arthralgias.  Skin: Negative for itching and rash.  Neurological: Negative for extremity weakness and headaches.  Hematological: Negative for adenopathy.  Psychiatric/Behavioral: Negative for confusion.    MEDICAL HISTORY:  Past Medical History:  Diagnosis Date  . 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   Marland Kitchen Anxiety   . Arthritis   . Bipolar disorder ([place])   . COPD (chronic obstructive pulmonary disease) ([place])    NO INHALERS  . Depression   . Dysrhythmia    H/O V TACH  . Head and neck cancer ([place]) 07/29/2020  . Headache    H/O MIGRAINES   . Hypertension   . Seizures ([place])    X1 AFTER FALL    SURGICAL HISTORY: Past Surgical History:  Procedure Laterality Date  . ABDOMINAL HYSTERECTOMY     partial  . BREAST SURGERY     biopsy  . CARDIAC CATHETERIZATION     X 2  . CARPAL TUNNEL RELEASE Right   . CARPECTOMY HAND Right   . CATARACT EXTRACTION W/PHACO Left 03/08/2018   Procedure: CATARACT EXTRACTION PHACO AND INTRAOCULAR LENS PLACEMENT (IOC);  Surgeon: Birder Robson, MD;  Location: [facility] ORS;  Service: Ophthalmology;  Laterality: Left;  [place] 00:55 AP% 13.5 CDE 7.52 Fluid pack lot # [id number]  . CATARACT EXTRACTION W/PHACO Right 04/05/2018   Procedure: CATARACT EXTRACTION PHACO AND INTRAOCULAR LENS PLACEMENT (IOC);  Surgeon: Birder Robson, MD;  Location: [facility] ORS;  Service: Ophthalmology;  Laterality: Right;  [place] 00:36 AP% 15.9 CDE 5.72 Fluid pack lot # [id number]  . CEREBRAL ANEURYSM REPAIR    . CHOLECYSTECTOMY    . COLONOSCOPY WITH PROPOFOL N/A 04/26/2019   Procedure: COLONOSCOPY WITH PROPOFOL;  Surgeon: Jonathon Bellows, MD;  Location: [facility] ENDOSCOPY;  Service: Gastroenterology;  Laterality: N/A;  . PORTACATH PLACEMENT Left 08/05/2020   Procedure: INSERTION PORT-A-CATH;  Surgeon: Herbert Pun, MD;  Location: [facility] ORS;  Service: General;  Laterality: Left;  . TUBAL LIGATION  SOCIAL HISTORY: Social History   Socioeconomic History  . Marital status: Divorced    Spouse name: Not on file  . Number of children: Not on file  . Years of education: 65  . Highest education level: Bachelor's degree (e.g., BA, AB, BS)  Occupational History  . Occupation: farmer  Tobacco Use  . Smoking status: Current Every Day Smoker    Packs/day: 0.25    Years: 50.00    Pack years: 12.50    Types: Cigarettes  . Smokeless tobacco: Never Used  Vaping Use  . Vaping Use: Never used  Substance and Sexual Activity  . Alcohol use: No  . Drug use: No  . Sexual activity: Not Currently  Other Topics Concern  . Not on file   Social History Narrative   Pt [facility] at [facility].    Social Determinants of Health   Financial Resource Strain:   . Difficulty of Paying Living Expenses: Not on file  Food Insecurity:   . Worried About Charity fundraiser in the Last Year: Not on file  . Ran Out of Food in the Last Year: Not on file  Transportation Needs:   . Lack of Transportation (Medical): Not on file  . Lack of Transportation (Non-Medical): Not on file  Physical Activity:   . Days of Exercise per Week: Not on file  . Minutes of Exercise per Session: Not on file  Stress:   . Feeling of Stress : Not on file  Social Connections:   . Frequency of Communication with Friends and Family: Not on file  . Frequency of Social Gatherings with Friends and Family: Not on file  . Attends Religious Services: Not on file  . Active Member of Clubs or Organizations: Not on file  . Attends Archivist Meetings: Not on file  . Marital Status: Not on file  Intimate Partner Violence:   . Fear of Current or Ex-Partner: Not on file  . Emotionally Abused: Not on file  . Physically Abused: Not on file  . Sexually Abused: Not on file    FAMILY HISTORY: Family History  Problem Relation Age of Onset  . Hypertension Mother   . Heart failure Mother   . Hypertension Brother   . Stroke Brother   . Hypertension Son   . Emphysema Maternal Aunt   . Hypertension Paternal Aunt   . Hypertension Paternal Uncle   . Hypertension Maternal Grandmother   . Hypertension Paternal Grandmother     ALLERGIES:  is allergic to aspirin, belladonna alkaloids, fluoxetine hcl, naproxen, phenobarbital, and prozac [fluoxetine hcl].  MEDICATIONS:  Current Outpatient Medications  Medication Sig Dispense Refill  . acyclovir (ZOVIRAX) 400 MG tablet Take 1 tablet (400 mg total) by mouth 2 (two) times daily. 60 tablet 0  . amiodarone (PACERONE) 200 MG tablet Take 1 tablet (200 mg total) by mouth daily. 90 tablet 1  . apixaban (ELIQUIS) 5  MG TABS tablet Take 1 tablet (5 mg total) by mouth 2 (two) times daily. 60 tablet 0  . chlorhexidine (PERIDEX) 0.12 % solution Use as directed 10 mLs in the mouth or throat 2 (two) times daily. Swish and spit 473 mL 1  . dexamethasone (DECADRON) 4 MG tablet Take 1 tablet (4 mg total) by mouth daily. 7 tablet 0  . docusate sodium (COLACE) 100 MG capsule Take 1 capsule (100 mg total) by mouth daily. 30 capsule 1  . feeding supplement, ENSURE ENLIVE, (ENSURE ENLIVE) LIQD Take 237 mLs by mouth  3 (three) times daily between meals. 237 mL 12  . fentaNYL (DURAGESIC) 25 MCG/HR Place 1 patch onto the skin every 3 (three) days. 7 patch 0  . furosemide (LASIX) 20 MG tablet Take 1 tablet (20 mg total) by mouth daily as needed (for lower extremity swelling). 90 tablet 1  . gabapentin (NEURONTIN) 300 MG capsule Take 1 capsule (300 mg total) by mouth 3 (three) times daily. 90 capsule 0  . lidocaine-prilocaine (EMLA) cream Apply to affected area once (Patient taking differently: Apply 1 application topically as needed [facility]). Apply to affected area once) 30 g 3  . lisinopril (ZESTRIL) 20 MG tablet Take 1 tablet (20 mg total) by mouth daily.    . magnesium chloride (SLOW-MAG) 64 MG TBEC SR tablet Take 1 tablet (64 mg total) by mouth daily. 30 tablet 1  . Melatonin 10 MG TABS Take 10 mg by mouth at bedtime as needed (sleep).     . Multiple Vitamins-Minerals (CENTRUM SILVER PO) Take 1 tablet by mouth daily. Gummie    . nicotine (NICODERM CQ - DOSED IN MG/24 HOURS) 21 mg/24hr patch Place 1 patch (21 mg total) onto the skin daily. 28 patch 0  . oxyCODONE-acetaminophen (PERCOCET) 5-325 MG tablet Take 2 tablets by mouth every 8 (eight) hours as needed for severe pain. 60 tablet 0  . polyethylene glycol powder (GLYCOLAX/MIRALAX) 17 GM/SCOOP powder Take 17 g by mouth daily as needed for mild constipation or moderate constipation. 255 g 0  . potassium chloride SA (KLOR-CON) 20 MEQ tablet Take 1 tablet (20 mEq total) by mouth  daily. 30 tablet 0  . prochlorperazine (COMPAZINE) 10 MG tablet Take 1 tablet (10 mg total) by mouth every 6 (six) hours as needed (Nausea or vomiting). 30 tablet 1   No current facility-administered medications for this visit.   Facility-Administered Medications Ordered in Other Visits  Medication Dose Route Frequency Provider Last Rate Last Admin  . sodium chloride flush (NS) 0.9 % injection 10 mL  10 mL Intravenous Once Earlie Server, MD         PHYSICAL EXAMINATION: ECOG PERFORMANCE STATUS: 1 - Symptomatic but completely ambulatory Vitals:   09/23/20 0844  BP: (!) 146/80  Pulse: (!) 50  Resp: 16  Temp: (!) 96.5 F (35.8 C)  SpO2: 99%   Filed Weights   09/23/20 0844  Weight: 134 lb 9.6 oz (61.1 kg)    Physical Exam Constitutional:      General: She is not in acute distress.    Comments: Thin built.  HENT:     Head: Normocephalic and atraumatic.     Mouth/Throat:     Comments: No thrush.  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 Crusted lesion on her lower lip Eyes:     General: No scleral icterus. Cardiovascular:     Rate and Rhythm: Normal rate and regular rhythm.     Heart sounds: Normal heart sounds.  Pulmonary:     Effort: Pulmonary effort is normal. No respiratory distress.     Breath sounds: No wheezing.  Abdominal:     General: Bowel sounds are normal. There is no distension.     Palpations: Abdomen is soft.  Musculoskeletal:        General: No deformity. Normal range of motion.     Cervical back: Normal range of motion and neck supple.  Skin:    General: Skin is warm  and dry.     Findings: No erythema or rash.  Neurological:     Mental Status: She is alert and oriented to person, place, and time. Mental status is at baseline.     Cranial Nerves: No cranial nerve deficit.     Coordination: Coordination normal.  Psychiatric:        Mood and  Affect: Mood normal.     LABORATORY DATA:  I have reviewed the data as listed Lab Results  Component Value Date   WBC 4.3 09/16/2020   HGB 10.1 (L) 09/16/2020   HCT 30.2 (L) 09/16/2020   MCV 100.3 (H) 09/16/2020   PLT 274 09/16/2020   Recent Labs    07/10/20 1311 07/10/20 1311 07/12/20 1204 08/08/20 0821 09/06/20 1141 09/06/20 1141 09/06/20 1243 09/07/20 0802 09/11/20 0625 09/16/20 0808 09/19/20 0849  NA 136   < > 137   < > 136   < > 140   < > 132* 135 140  K 2.9*   < > 4.0   < > 2.7*   < > 2.8*   < > 3.5 3.0* 3.7  CL 97*   < > 98   < > 97*   < > 99   < > 99 99 104  CO2 28   < > 27   < > 27   < > 26   < > 28 28 28   GLUCOSE 100*   < > 114*   < > 133*   < > 107*   < > 89 147* 97  BUN 13   < > 8   < > 13   < > 12   < > 7* 14 19  CREATININE 0.43*   < > 0.42*   < > 0.49   < > 0.39*   < > <0.30* 0.46 0.41*  CALCIUM 8.9   < > 9.0   < > 7.9*   < > 7.6*   < > 8.0* 8.6* 8.4*  GFRNONAA >60   < > >60   < > >60   < > >60   < > NOT CALCULATED >60 >60  GFRAA >60  --  >60  --   --   --   --   --   --   --   --   PROT 7.0  --   --    < > 5.7*  --  5.6*  --   --  5.9*  --   ALBUMIN 3.7  --   --    < > 2.6*  --  2.6*  --   --  3.0*  --   AST 20  --   --    < > 20  --  19  --   --  19  --   ALT 16  --   --    < > 17  --  16  --   --  15  --   ALKPHOS 106  --   --    < > 86  --  85  --   --  68  --   BILITOT 0.5  --   --    < > 0.5  --  0.7  --   --  0.4  --    < > = values in this interval not displayed.   Iron/TIBC/Ferritin/ %Sat    Component Value Date/Time   IRON 46 09/09/2020 0938   IRON 37 05/03/2018  1826   TIBC 210 (L) 09/09/2020 0938   FERRITIN 136 09/09/2020 0938   FERRITIN 52 05/03/2018 1826   IRONPCTSAT 22 09/09/2020 0938      RADIOGRAPHIC STUDIES: I have personally reviewed the radiological images as listed and agreed with the findings in the report. CT Chest W Contrast  Result Date: 09/06/2020 CLINICAL DATA:  Follow-up abnormal chest x-ray EXAM: CT CHEST WITH  CONTRAST TECHNIQUE: Multidetector CT imaging of the chest was performed during intravenous contrast administration. CONTRAST:  54mL OMNIPAQUE IOHEXOL 300 MG/ML  SOLN COMPARISON:  Chest x-ray from earlier in the same day and CT from 08/08/2020. FINDINGS: Cardiovascular: Thoracic aorta demonstrates atherosclerotic calcifications. No dissection is noted. No aneurysmal dilatation is seen. The pulmonary artery as visualized shows no pulmonary embolism although not timed for true embolus evaluation. No cardiac enlargement is seen. No coronary calcifications are noted. Left-sided chest wall port is noted in satisfactory position. Mediastinum/Nodes: Thoracic inlet is within normal limits. Scattered right hilar and subcarinal lymph nodes are seen. The largest of the hilar lymph nodes measures approximately 13 mm in short axis. Subcarinal node measures 10 mm in short axis. The esophagus as visualized is within normal limits. Lungs/Pleura: Lungs are well aerated bilaterally. The previously seen chest x-ray abnormality lies in the posterior aspect of the right upper lobe and is pleural base consistent with acute pneumonia. Patchy similar infiltrates are noted in the right middle lobe as well as the lingula on the left. The appearance of cavitary component seen on prior plain film is not borne out on the CT and likely projectional in nature. No sizable parenchymal nodule is seen. No effusion or pneumothorax is noted. Upper Abdomen: Visualized upper abdomen shows mild fatty infiltration of the liver. Musculoskeletal: Degenerative changes of the thoracic spine are noted. No acute rib abnormality is noted. No compression deformity is seen. IMPRESSION: Patchy multifocal infiltrate worst in the right upper lobe posteriorly and laterally consistent with that seen on recent plain film examination. This is new from the prior CT consistent with multifocal pneumonia. Followup PA and lateral chest X-ray is recommended in 3-4 weeks  following trial of antibiotic therapy to ensure resolution. Fatty liver. No other focal abnormality is noted. Aortic Atherosclerosis (ICD10-I70.0). Electronically Signed   By: Inez Catalina M.D.   On: 09/06/2020 16:12   DG Chest Portable 1 View  Result Date: 09/06/2020 CLINICAL DATA:  Weakness.  Hypotension. EXAM: PORTABLE CHEST 1 VIEW COMPARISON:  CT 08/08/2020.  Chest x-ray 08/08/2020. FINDINGS: PowerPort catheter noted with tip over superior vena cava. Cardiomegaly. No pulmonary venous congestion. Multifocal right lung infiltrates noted. Tiny cavitary infiltrate right upper lung cannot be excluded. Mild left base atelectasis scratched it mild bibasilar atelectasis. No pleural effusion or pneumothorax. IMPRESSION: 1. PowerPort catheter with tip over superior vena cava. 2. Multifocal right lung infiltrates noted. Tiny cavitary infiltrate right upper lung cannot be excluded. Electronically Signed   By: Marcello Moores  Register   On: 09/06/2020 13:26      ASSESSMENT & PLAN:  1. 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[place])   2. Encounter for antineoplastic chemotherapy   3. History of atrial fibrillation   4. Weight loss   5. Hypokalemia   6. 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  Labs are reviewed and discussed with patient. Clinically she is doing well.  Proceed with cisplatin treatment today.  Recommend patient continue mouthwash, acyclovir 400 mg twice a day for HSV prophylaxis  # severe hypokalemia, patient  will received 1L IVF with 34meq KCl today.  I advise her to increase oral potassium to 72meq daily. Rx sent to pharamcy.   # Hypomagnesia, Mg is stable continue oral Magnesium.   #Poor oral intake, dysphagia Patient has IV fluid normal saline 1 L prior to the cisplatin treatment. She declines PET tube.   #Atrial fibrillation.  Continue amiodarone and metoprolol. #Blood pressure is better this week.  Restarted on lisinopril    ##neoplasm induced pain,  continue fentanyl patch 57mcg/h Q72 hours. .  Continue garbapentine 300mg  TID.  2 Percocet tablets every 8 hours as needed for pain.   # constipation, continue colace 100mg  daily and use miralax daily PRN All questions were answered. The patient knows to call the clinic with any problems questions or concerns.   Return of visit: 1 week  Earlie Server, MD, PhD Hematology Oncology [facility] at [facility] Pager- [phone] 09/23/2020

## 2020-09-24 ENCOUNTER — Ambulatory Visit
Admission: RE | Admit: 2020-09-24 | Discharge: 2020-09-24 | Disposition: A | Discharge status: Home / Self Care | Payer: Medicaid Other | Source: Ambulatory Visit | Attending: Radiation Oncology | Admitting: Radiation Oncology

## 2020-09-24 ENCOUNTER — Telehealth: Payer: Self-pay

## 2020-09-24 DIAGNOSIS — Z510 Encounter for antineoplastic radiation therapy: Secondary | ICD-10-CM | POA: Diagnosis not present

## 2020-09-24 NOTE — Telephone Encounter (Signed)
Error

## 2020-09-25 ENCOUNTER — Ambulatory Visit: Payer: Medicaid Other

## 2020-09-25 ENCOUNTER — Ambulatory Visit
Admission: RE | Admit: 2020-09-25 | Discharge: 2020-09-25 | Disposition: A | Discharge status: Home / Self Care | Payer: Medicaid Other | Source: Ambulatory Visit | Attending: Radiation Oncology | Admitting: Radiation Oncology

## 2020-09-25 DIAGNOSIS — Z510 Encounter for antineoplastic radiation therapy: Secondary | ICD-10-CM | POA: Diagnosis not present

## 2020-09-30 ENCOUNTER — Inpatient Hospital Stay: Payer: Medicaid Other

## 2020-09-30 ENCOUNTER — Ambulatory Visit
Admission: RE | Admit: 2020-09-30 | Discharge: 2020-09-30 | Disposition: A | Discharge status: Home / Self Care | Payer: Medicaid Other | Source: Ambulatory Visit | Attending: Radiation Oncology | Admitting: Radiation Oncology

## 2020-09-30 ENCOUNTER — Inpatient Hospital Stay (HOSPITAL_BASED_OUTPATIENT_CLINIC_OR_DEPARTMENT_OTHER): Payer: Medicaid Other | Admitting: Oncology

## 2020-09-30 ENCOUNTER — Encounter: Payer: Self-pay | Admitting: Oncology

## 2020-09-30 ENCOUNTER — Telehealth: Payer: Self-pay | Admitting: *Deleted

## 2020-09-30 VITALS — BP 184/90 | HR 50 | Temp 98.0°F | Resp 20 | Wt 118.9 lb

## 2020-09-30 VITALS — BP 179/84

## 2020-09-30 DIAGNOSIS — C310 Malignant neoplasm of maxillary sinus: Secondary | ICD-10-CM

## 2020-09-30 DIAGNOSIS — Z8679 Personal history of other diseases of the circulatory system: Secondary | ICD-10-CM

## 2020-09-30 DIAGNOSIS — E876 Hypokalemia: Secondary | ICD-10-CM

## 2020-09-30 DIAGNOSIS — C760 Malignant neoplasm of head, face and neck: Secondary | ICD-10-CM

## 2020-09-30 DIAGNOSIS — Z510 Encounter for antineoplastic radiation therapy: Secondary | ICD-10-CM | POA: Diagnosis not present

## 2020-09-30 DIAGNOSIS — Z5111 Encounter for antineoplastic chemotherapy: Secondary | ICD-10-CM

## 2020-09-30 DIAGNOSIS — G893 Neoplasm related pain (acute) (chronic): Secondary | ICD-10-CM

## 2020-09-30 LAB — CBC WITH DIFFERENTIAL/PLATELET
Abs Immature Granulocytes: 0 10*3/uL (ref 0.00–0.07)
Basophils Absolute: 0 10*3/uL (ref 0.0–0.1)
Basophils Relative: 2 %
Eosinophils Absolute: 0.1 10*3/uL (ref 0.0–0.5)
Eosinophils Relative: 4 %
HCT: 30.4 % — ABNORMAL LOW (ref 36.0–46.0)
Hemoglobin: 10.1 g/dL — ABNORMAL LOW (ref 12.0–15.0)
Immature Granulocytes: 0 %
Lymphocytes Relative: 20 %
Lymphs Abs: 0.4 10*3/uL — ABNORMAL LOW (ref 0.7–4.0)
MCH: 33.4 pg (ref 26.0–34.0)
MCHC: 33.2 g/dL (ref 30.0–36.0)
MCV: 100.7 fL — ABNORMAL HIGH (ref 80.0–100.0)
Monocytes Absolute: 0.3 10*3/uL (ref 0.1–1.0)
Monocytes Relative: 12 %
Neutro Abs: 1.4 10*3/uL — ABNORMAL LOW (ref 1.7–7.7)
Neutrophils Relative %: 62 %
Platelets: 197 10*3/uL (ref 150–400)
RBC: 3.02 MIL/uL — ABNORMAL LOW (ref 3.87–5.11)
RDW: 16.2 % — ABNORMAL HIGH (ref 11.5–15.5)
WBC: 2.2 10*3/uL — ABNORMAL LOW (ref 4.0–10.5)
nRBC: 0 % (ref 0.0–0.2)

## 2020-09-30 LAB — MAGNESIUM: Magnesium: 1.6 mg/dL — ABNORMAL LOW (ref 1.7–2.4)

## 2020-09-30 LAB — COMPREHENSIVE METABOLIC PANEL
ALT: 14 U/L (ref 0–44)
AST: 22 U/L (ref 15–41)
Albumin: 3.6 g/dL (ref 3.5–5.0)
Alkaline Phosphatase: 67 U/L (ref 38–126)
Anion gap: 11 (ref 5–15)
BUN: 7 mg/dL — ABNORMAL LOW (ref 8–23)
CO2: 29 mmol/L (ref 22–32)
Calcium: 8.5 mg/dL — ABNORMAL LOW (ref 8.9–10.3)
Chloride: 99 mmol/L (ref 98–111)
Creatinine, Ser: 0.49 mg/dL (ref 0.44–1.00)
GFR, Estimated: >60 mL/min (ref >60)
Glucose, Bld: 85 mg/dL (ref 70–99)
Potassium: 2.9 mmol/L — ABNORMAL LOW (ref 3.5–5.1)
Sodium: 139 mmol/L (ref 135–145)
Total Bilirubin: 0.6 mg/dL (ref 0.3–1.2)
Total Protein: 6.4 g/dL — ABNORMAL LOW (ref 6.5–8.1)

## 2020-09-30 MED ORDER — POTASSIUM CHLORIDE IN NACL 20-0.9 MEQ/L-% IV SOLN
INTRAVENOUS | Status: DC
  Filled 2020-09-30 (×2): qty 1000

## 2020-09-30 MED ORDER — HEPARIN SOD (PORK) LOCK FLUSH 100 UNIT/ML IV SOLN
INTRAVENOUS | Status: AC
  Filled 2020-09-30: qty 5

## 2020-09-30 MED ORDER — SODIUM CHLORIDE 0.9 % IV SOLN
150.0000 mg | Freq: Once | INTRAVENOUS | Status: AC
  Administered 2020-09-30: 150 mg via INTRAVENOUS
  Filled 2020-09-30: qty 5

## 2020-09-30 MED ORDER — SODIUM CHLORIDE 0.9 % IV SOLN
Freq: Once | INTRAVENOUS | Status: AC
  Filled 2020-09-30: qty 250

## 2020-09-30 MED ORDER — SODIUM CHLORIDE 0.9% FLUSH
10.0000 mL | INTRAVENOUS | Status: DC | PRN
  Administered 2020-09-30: 10 mL via INTRAVENOUS
  Filled 2020-09-30: qty 10

## 2020-09-30 MED ORDER — SODIUM CHLORIDE 0.9 % IV SOLN
Freq: Once | INTRAVENOUS | Status: AC
  Filled 2020-09-30: qty 1000

## 2020-09-30 MED ORDER — PALONOSETRON HCL INJECTION 0.25 MG/5ML
0.2500 mg | Freq: Once | INTRAVENOUS | Status: AC
  Administered 2020-09-30: 0.25 mg via INTRAVENOUS
  Filled 2020-09-30: qty 5

## 2020-09-30 MED ORDER — SODIUM CHLORIDE 0.9 % IV SOLN
40.0000 mg/m2 | Freq: Once | INTRAVENOUS | Status: AC
  Administered 2020-09-30: 66 mg via INTRAVENOUS
  Filled 2020-09-30: qty 66

## 2020-09-30 MED ORDER — SODIUM CHLORIDE 0.9 % IV SOLN
10.0000 mg | Freq: Once | INTRAVENOUS | Status: AC
  Administered 2020-09-30: 10 mg via INTRAVENOUS
  Filled 2020-09-30: qty 1

## 2020-09-30 MED ORDER — HEPARIN SOD (PORK) LOCK FLUSH 100 UNIT/ML IV SOLN
500.0000 [IU] | Freq: Once | INTRAVENOUS | Status: DC | PRN
  Filled 2020-09-30: qty 5

## 2020-09-30 MED ORDER — HEPARIN SOD (PORK) LOCK FLUSH 100 UNIT/ML IV SOLN
500.0000 [IU] | Freq: Once | INTRAVENOUS | Status: AC
  Administered 2020-09-30: 500 [IU] via INTRAVENOUS
  Filled 2020-09-30: qty 5

## 2020-09-30 NOTE — Progress Notes (Signed)
Patient stable at discharge.

## 2020-09-30 NOTE — Progress Notes (Signed)
Hematology/Oncology follow up note [facility] [phone] [phone]   Patient Care Team: Pcp, No as PCP - General Rockey Situ, Kathlene November, MD as PCP - Cardiology (Cardiology) Earlie Server, MD as Consulting Physician (Oncology)  CHIEF COMPLAINTS/REASON FOR VISIT:  Follow-up for head and neck cancer  HISTORY OF PRESENTING ILLNESS:   Shannon Schroeder is a  64 y.o.  female with PMH listed below was seen in consultation at the request of ER Dr. [name] for evaluation of abnormal CT scan.  07/10/2020 she presented emergency room for evaluation of right-sided facial swelling and pain in the right side of her mouth for the past 6 weeks.  It started in her mouth with a small ulcer which progressively got worse.  She wears denture.  Not able to chew well.  She eats soft food. She was found to have right upper gum also with yellowish discharge noted. 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  Avoid piloting.,  Right orbital apex, also the right soft palate and palate and tonsil.  Furthermore there is evidence of intracranial extension along the right V2 at the foramen rotundum Small suspicious right level 1A lymph node measuring 7 mm.  Separate small indeterminate but suspicious enhancing soft tissue nodule of the left sublingual space.  Patient was referred to oncology for further evaluation management. Patient denies fever, chills, nausea vomiting diarrhea shortness of breath or cough.  Reports 10 out of 10 severe pain of right face and numbness.  She also has headache. Reports unintentional weight loss  She reports no contactable family members.  She is in the process of pointing her roommate [name] to be her power of attorney.  She also wants to record today's conversation for her roommate  [name]. Patient has 50-pack-year smoking history.  # seen by ENT Dr.Juengel and had a biopsy. Biopsy was sent to [facility] and the final pathology was positive for invasive squamous cell carcinoma, well-differentiated, keratinizing. 07/17/2020, brain MRI showed large right facial mass with right maxillary erosion involving the maxillary sinus, hard palate and pterygoid plates and muscles.  Perineural spreading along the multiple branches of the maxillary division of the right trigeminal nerve at the infraorbital canal, pterygopalatine fossa and foramen rotundum. 07/17/2020, PET scan showed Intense FDG uptake is associated with the large enhancing tumor which involves the right maxilla, right nasal cavity, right pterygoid palatine fossa, right orbital apex and right soft tissue palate and palatine tonsil. 2. Mild nonspecific FDG uptake is associated with the recently characterized suspicious right level 1A lymph node which measures 7 mm and has an SUV max of 2.12.  No signs of distant metastasis.  Aortic atherosclerosis.  Patient also reports a history of hypertension, dysrhythmia, history of V. tach.  Patient previously was on lisinopril, atenolol, amlodipine for which she is no longer taking at this point due to lack of medication coverage.  She also does not have any primary care provider. 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  On average she says she is takes 2 to 3 pills/day.  She also takes gabapentin 300 mg 3 times daily  # new onset of atrial fibrillation with rapid ventricular  response.  Patient was started on metoprolol.  Eliquis for anticoagulation.  # 09/06/2020- 09/11/2020 admitted due to A. fib, hypotension, multifocal pneumonia.  Patient was treated with IV antibiotics.  Cardiology saw the patient during admission and  added amiodarone.  Patient was discharged on 5 days course of oral antibiotics, continued on metoprolol and amiodarone for atrial fibrillation.  # Dysphagia, she declined PET tube placment.   INTERVAL HISTORY Shannon Schroeder is a 64 y.o. female who has above history reviewed by me today presents for follow up visit for management of head and neck cancer, post hospitalization follow up  Problems and complaints are listed below:  Patient reports feeling well.  Still has dysphagia.  She takes potassium chloride 40 mEq daily.  She takes magnesium supplementation. Right facial pain is controlled. Denies any fever, chills, nausea, vomiting, diarrhea, or coughing. She has lost weight since last visit. Initially blood pressure was high in the clinic, 184/90.  Pulse rate 50. Remeasure blood pressure shows improvement of BP.  She denies any headache or focal weakness.  Review of Systems  Constitutional: Positive for fatigue and unexpected weight change. Negative for appetite change, chills and fever.  HENT:   Negative for hearing loss, mouth sores and voice change.        Facial swelling, pain and numbness improved.  Eyes: Negative for eye problems.  Respiratory: Negative for chest tightness, cough and shortness of breath.   Cardiovascular: Negative for chest pain and palpitations.  Gastrointestinal: Negative for abdominal distention, abdominal pain, blood in stool, constipation, nausea and vomiting.       Dysphagia  Endocrine: Negative for hot flashes.  Genitourinary: Negative for difficulty urinating and frequency.   Musculoskeletal: Negative for arthralgias.  Skin: Negative for itching and rash.  Neurological: Negative for extremity weakness and headaches.  Hematological: Negative for adenopathy.  Psychiatric/Behavioral: Negative for confusion.    MEDICAL HISTORY:  Past Medical History:  Diagnosis Date  . 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   Marland Kitchen Anxiety   . Arthritis   . Bipolar disorder ([place])   . COPD (chronic obstructive pulmonary disease) ([place])    NO INHALERS  . Depression   . Dysrhythmia    H/O V TACH  . Head and neck cancer ([place]) 07/29/2020  . Headache    H/O MIGRAINES  . Hypertension   . Seizures ([place])    X1 AFTER FALL    SURGICAL HISTORY: Past Surgical History:  Procedure Laterality Date  . ABDOMINAL HYSTERECTOMY     partial  . BREAST SURGERY     biopsy  . CARDIAC CATHETERIZATION     X 2  . CARPAL TUNNEL RELEASE Right   . CARPECTOMY HAND Right   . CATARACT EXTRACTION W/PHACO Left 03/08/2018   Procedure: CATARACT EXTRACTION PHACO AND INTRAOCULAR LENS PLACEMENT (IOC);  Surgeon: Birder Robson, MD;  Location: [facility] ORS;  Service: Ophthalmology;  Laterality: Left;  [place] 00:55 AP% 13.5 CDE 7.52 Fluid pack lot # [id number]  . CATARACT EXTRACTION W/PHACO Right 04/05/2018   Procedure: CATARACT EXTRACTION PHACO AND INTRAOCULAR LENS PLACEMENT (IOC);  Surgeon: Birder Robson, MD;  Location: [facility] ORS;  Service: Ophthalmology;  Laterality: Right;  [place] 00:36 AP% 15.9 CDE 5.72 Fluid pack lot # [id number]  . CEREBRAL ANEURYSM REPAIR    . CHOLECYSTECTOMY    . COLONOSCOPY WITH PROPOFOL N/A 04/26/2019   Procedure: COLONOSCOPY WITH PROPOFOL;  Surgeon: Jonathon Bellows, MD;  Location: [facility] ENDOSCOPY;  Service:  Gastroenterology;  Laterality: N/A;  . PORTACATH PLACEMENT Left 08/05/2020   Procedure: INSERTION PORT-A-CATH;  Surgeon: Herbert Pun, MD;  Location: [facility] ORS;  Service: General;  Laterality: Left;  . TUBAL LIGATION      SOCIAL HISTORY: Social History   Socioeconomic History  . Marital status: Divorced    Spouse name: Not on file  . Number of children: Not on file  . Years of education: 72  . Highest education level: Bachelor's degree (e.g., BA, AB, BS)  Occupational History  . Occupation: farmer  Tobacco Use  . Smoking status: Current Every Day Smoker    Packs/day: 0.25    Years: 50.00    Pack  years: 12.50    Types: Cigarettes  . Smokeless tobacco: Never Used  Vaping Use  . Vaping Use: Never used  Substance and Sexual Activity  . Alcohol use: No  . Drug use: No  . Sexual activity: Not Currently  Other Topics Concern  . Not on file  Social History Narrative   Pt [facility] at [facility].    Social Determinants of Health   Financial Resource Strain:   . Difficulty of Paying Living Expenses: Not on file  Food Insecurity:   . Worried About Charity fundraiser in the Last Year: Not on file  . Ran Out of Food in the Last Year: Not on file  Transportation Needs:   . Lack of Transportation (Medical): Not on file  . Lack of Transportation (Non-Medical): Not on file  Physical Activity:   . Days of Exercise per Week: Not on file  . Minutes of Exercise per Session: Not on file  Stress:   . Feeling of Stress : Not on file  Social Connections:   . Frequency of Communication with Friends and Family: Not on file  . Frequency of Social Gatherings with Friends and Family: Not on file  . Attends Religious Services: Not on file  . Active Member of Clubs or Organizations: Not on file  . Attends Archivist Meetings: Not on file  . Marital Status: Not on file  Intimate Partner Violence:   . Fear of Current or Ex-Partner: Not on file  . Emotionally Abused: Not on file  . Physically Abused: Not on file  . Sexually Abused: Not on file    FAMILY HISTORY: Family History  Problem Relation Age of Onset  . Hypertension Mother   . Heart failure Mother   . Hypertension Brother   . Stroke Brother   . Hypertension Son   . Emphysema Maternal Aunt   . Hypertension Paternal Aunt   . Hypertension Paternal Uncle   . Hypertension Maternal Grandmother   . Hypertension Paternal Grandmother     ALLERGIES:  is allergic to aspirin, belladonna alkaloids, fluoxetine hcl, naproxen, phenobarbital, and prozac [fluoxetine hcl].  MEDICATIONS:  Current Outpatient Medications    Medication Sig Dispense Refill  . acyclovir (ZOVIRAX) 400 MG tablet Take 1 tablet (400 mg total) by mouth 2 (two) times daily. 60 tablet 0  . amiodarone (PACERONE) 200 MG tablet Take 1 tablet (200 mg total) by mouth daily. 90 tablet 1  . apixaban (ELIQUIS) 5 MG TABS tablet Take 1 tablet (5 mg total) by mouth 2 (two) times daily. 60 tablet 0  . chlorhexidine (PERIDEX) 0.12 % solution Use as directed 10 mLs in the mouth or throat 2 (two) times daily. Swish and spit 473 mL 1  . dexamethasone (DECADRON) 4 MG tablet Take 1 tablet (4 mg total)  by mouth daily. 7 tablet 0  . docusate sodium (COLACE) 100 MG capsule Take 1 capsule (100 mg total) by mouth daily. 30 capsule 1  . feeding supplement, ENSURE ENLIVE, (ENSURE ENLIVE) LIQD Take 237 mLs by mouth 3 (three) times daily between meals. 237 mL 12  . fentaNYL (DURAGESIC) 25 MCG/HR Place 1 patch onto the skin every 3 (three) days. 7 patch 0  . furosemide (LASIX) 20 MG tablet Take 1 tablet (20 mg total) by mouth daily as needed (for lower extremity swelling). 90 tablet 1  . gabapentin (NEURONTIN) 300 MG capsule Take 1 capsule (300 mg total) by mouth 3 (three) times daily. 90 capsule 0  . lidocaine-prilocaine (EMLA) cream Apply to affected area once (Patient taking differently: Apply 1 application topically as needed [facility]). Apply to affected area once) 30 g 3  . lisinopril (ZESTRIL) 20 MG tablet Take 1 tablet (20 mg total) by mouth daily.    . magnesium chloride (SLOW-MAG) 64 MG TBEC SR tablet Take 1 tablet (64 mg total) by mouth daily. 30 tablet 1  . Melatonin 10 MG TABS Take 10 mg by mouth at bedtime as needed (sleep).     . Multiple Vitamins-Minerals (CENTRUM SILVER PO) Take 1 tablet by mouth daily. Gummie    . nicotine (NICODERM CQ - DOSED IN MG/24 HOURS) 21 mg/24hr patch Place 1 patch (21 mg total) onto the skin daily. 28 patch 0  . oxyCODONE-acetaminophen (PERCOCET) 5-325 MG tablet Take 2 tablets by mouth every 8 (eight) hours as needed for severe  pain. 60 tablet 0  . polyethylene glycol powder (GLYCOLAX/MIRALAX) 17 GM/SCOOP powder Take 17 g by mouth daily as needed for mild constipation or moderate constipation. 255 g 0  . potassium chloride SA (KLOR-CON) 20 MEQ tablet Take 2 tablets (40 mEq total) by mouth daily. 60 tablet 0  . prochlorperazine (COMPAZINE) 10 MG tablet Take 1 tablet (10 mg total) by mouth every 6 (six) hours as needed (Nausea or vomiting). 30 tablet 1   No current facility-administered medications for this visit.   Facility-Administered Medications Ordered in Other Visits  Medication Dose Route Frequency Provider Last Rate Last Admin  . heparin lock flush 100 unit/mL  500 Units Intravenous Once Earlie Server, MD      . sodium chloride flush (NS) 0.9 % injection 10 mL  10 mL Intravenous PRN Earlie Server, MD         PHYSICAL EXAMINATION: ECOG PERFORMANCE STATUS: 1 - Symptomatic but completely ambulatory Vitals:   09/30/20 0907  BP: (!) 184/90  Pulse: (!) 50  Resp: 20  Temp: 98 F (36.7 C)  SpO2: 100%   Filed Weights   09/30/20 0907  Weight: 118 lb 14.4 oz (53.9 kg)    Physical Exam Constitutional:      General: She is not in acute distress.    Comments: Thin built.  HENT:     Head: Normocephalic and atraumatic.     Mouth/Throat:     Comments: No thrush.  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 Cardiovascular:     Rate and Rhythm: Normal rate and regular rhythm.     Heart sounds: Normal heart sounds.  Pulmonary:     Effort: Pulmonary effort is normal. No respiratory distress.     Breath sounds: No wheezing.  Abdominal:     General: Bowel sounds are normal. There is no distension.  Palpations: Abdomen is soft.  Musculoskeletal:        General: No deformity. Normal range of motion.     Cervical back: Normal range of motion and neck supple.  Skin:    General: Skin is warm and dry.     Findings: No  erythema or rash.  Neurological:     Mental Status: She is alert and oriented to person, place, and time. Mental status is at baseline.     Cranial Nerves: No cranial nerve deficit.     Coordination: Coordination normal.  Psychiatric:        Mood and Affect: Mood normal.     LABORATORY DATA:  I have reviewed the data as listed Lab Results  Component Value Date   WBC 2.6 (L) 09/23/2020   HGB 9.5 (L) 09/23/2020   HCT 28.3 (L) 09/23/2020   MCV 100.0 09/23/2020   PLT 208 09/23/2020   Recent Labs    07/10/20 1311 07/10/20 1311 07/12/20 1204 08/08/20 0821 09/06/20 1243 09/07/20 0802 09/16/20 0808 09/19/20 0849 09/23/20 0818  NA 136   < > 137   < > 140   < > 135 140 138  K 2.9*   < > 4.0   < > 2.8*   < > 3.0* 3.7 2.9*  CL 97*   < > 98   < > 99   < > 99 104 99  CO2 28   < > 27   < > 26   < > 28 28 28   GLUCOSE 100*   < > 114*   < > 107*   < > 147* 97 98  BUN 13   < > 8   < > 12   < > 14 19 5*  CREATININE 0.43*   < > 0.42*   < > 0.39*   < > 0.46 0.41* 0.44  CALCIUM 8.9   < > 9.0   < > 7.6*   < > 8.6* 8.4* 8.2*  GFRNONAA >60   < > >60   < > >60   < > >60 >60 >60  GFRAA >60  --  >60  --   --   --   --   --   --   PROT 7.0  --   --    < > 5.6*  --  5.9*  --  5.4*  ALBUMIN 3.7  --   --    < > 2.6*  --  3.0*  --  3.0*  AST 20  --   --    < > 19  --  19  --  17  ALT 16  --   --    < > 16  --  15  --  16  ALKPHOS 106  --   --    < > 85  --  68  --  68  BILITOT 0.5  --   --    < > 0.7  --  0.4  --  0.8   < > = values in this interval not displayed.   Iron/TIBC/Ferritin/ %Sat    Component Value Date/Time   IRON 46 09/09/2020 0938   IRON 37 05/03/2018 1826   TIBC 210 (L) 09/09/2020 0938   FERRITIN 136 09/09/2020 0938   FERRITIN 52 05/03/2018 1826   IRONPCTSAT 22 09/09/2020 0938      RADIOGRAPHIC STUDIES: I have personally reviewed the radiological images as listed and agreed with the findings in the report.  CT Chest W Contrast  Result Date: 09/06/2020 CLINICAL DATA:   Follow-up abnormal chest x-ray EXAM: CT CHEST WITH CONTRAST TECHNIQUE: Multidetector CT imaging of the chest was performed during intravenous contrast administration. CONTRAST:  85mL OMNIPAQUE IOHEXOL 300 MG/ML  SOLN COMPARISON:  Chest x-ray from earlier in the same day and CT from 08/08/2020. FINDINGS: Cardiovascular: Thoracic aorta demonstrates atherosclerotic calcifications. No dissection is noted. No aneurysmal dilatation is seen. The pulmonary artery as visualized shows no pulmonary embolism although not timed for true embolus evaluation. No cardiac enlargement is seen. No coronary calcifications are noted. Left-sided chest wall port is noted in satisfactory position. Mediastinum/Nodes: Thoracic inlet is within normal limits. Scattered right hilar and subcarinal lymph nodes are seen. The largest of the hilar lymph nodes measures approximately 13 mm in short axis. Subcarinal node measures 10 mm in short axis. The esophagus as visualized is within normal limits. Lungs/Pleura: Lungs are well aerated bilaterally. The previously seen chest x-ray abnormality lies in the posterior aspect of the right upper lobe and is pleural base consistent with acute pneumonia. Patchy similar infiltrates are noted in the right middle lobe as well as the lingula on the left. The appearance of cavitary component seen on prior plain film is not borne out on the CT and likely projectional in nature. No sizable parenchymal nodule is seen. No effusion or pneumothorax is noted. Upper Abdomen: Visualized upper abdomen shows mild fatty infiltration of the liver. Musculoskeletal: Degenerative changes of the thoracic spine are noted. No acute rib abnormality is noted. No compression deformity is seen. IMPRESSION: Patchy multifocal infiltrate worst in the right upper lobe posteriorly and laterally consistent with that seen on recent plain film examination. This is new from the prior CT consistent with multifocal pneumonia. Followup PA and  lateral chest X-ray is recommended in 3-4 weeks following trial of antibiotic therapy to ensure resolution. Fatty liver. No other focal abnormality is noted. Aortic Atherosclerosis (ICD10-I70.0). Electronically Signed   By: Inez Catalina M.D.   On: 09/06/2020 16:12   DG Chest Portable 1 View  Result Date: 09/06/2020 CLINICAL DATA:  Weakness.  Hypotension. EXAM: PORTABLE CHEST 1 VIEW COMPARISON:  CT 08/08/2020.  Chest x-ray 08/08/2020. FINDINGS: PowerPort catheter noted with tip over superior vena cava. Cardiomegaly. No pulmonary venous congestion. Multifocal right lung infiltrates noted. Tiny cavitary infiltrate right upper lung cannot be excluded. Mild left base atelectasis scratched it mild bibasilar atelectasis. No pleural effusion or pneumothorax. IMPRESSION: 1. PowerPort catheter with tip over superior vena cava. 2. Multifocal right lung infiltrates noted. Tiny cavitary infiltrate right upper lung cannot be excluded. Electronically Signed   By: Marcello Moores  Register   On: 09/06/2020 13:26      ASSESSMENT & PLAN:  1. 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[place])   2. Encounter for antineoplastic chemotherapy   3. History of atrial fibrillation   4. Neoplasm related pain   5. 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  Labs are reviewed and discussed with patient.  Counts are stable. Proceed with cisplatin treatment today.  Recommend patient continue mouthwash, acyclovir 400 mg twice a day for HSV prophylaxis  # severe hypokalemia, patient will received 2L IVF with 13meq KCl today.  Continue oral potassium to 65meq daily.   # Hypomagnesia, Mg is stable continue oral Magnesium.  Patient will get 2 g of magnesium along with fluid today.  #Poor oral intake, dysphagia IV fluid hydration. She declines PET tube.  We will schedule patient for another IV  hydration/later this week.  #Atrial fibrillation.  Continue amiodarone and metoprolol. #Blood pressure is better this week.  BP was  initially high, will measure blood pressure improved to be within treatment parameters.  Continue monitor.   ##neoplasm induced pain, continue fentanyl patch 52mcg/h Q72 hours. .  Continue garbapentine 300mg  TID.  2 Percocet tablets every 8 hours as needed for pain.    All questions were answered. The patient knows to call the clinic with any problems questions or concerns.   Return of visit: 1 week  Earlie Server, MD, PhD Hematology Oncology [facility] Pager- [phone] 09/30/2020

## 2020-09-30 NOTE — Telephone Encounter (Signed)
Faxed provider's portion to [employer] pt assistance for Eliquis 5mg  to: [phone]

## 2020-10-01 ENCOUNTER — Ambulatory Visit
Admission: RE | Admit: 2020-10-01 | Discharge: 2020-10-01 | Disposition: A | Discharge status: Home / Self Care | Payer: Medicaid Other | Source: Ambulatory Visit | Attending: Radiation Oncology | Admitting: Radiation Oncology

## 2020-10-01 ENCOUNTER — Ambulatory Visit: Payer: Medicaid Other

## 2020-10-01 DIAGNOSIS — Z510 Encounter for antineoplastic radiation therapy: Secondary | ICD-10-CM | POA: Diagnosis not present

## 2020-10-01 NOTE — Telephone Encounter (Signed)
Patient dropped off Patient Assistance forms to be completed Placed in nurse box  

## 2020-10-01 NOTE — Telephone Encounter (Signed)
Faxed completed application including patient's paperwork that she dropped off. Faxed to [phone].  Called pt, notified application has been sent and to give 24-72 hours to hear back from [facility].  Pt appreciative and has no other needs at this time.

## 2020-10-02 ENCOUNTER — Ambulatory Visit
Admission: RE | Admit: 2020-10-02 | Discharge: 2020-10-02 | Disposition: A | Discharge status: Home / Self Care | Payer: Medicaid Other | Source: Ambulatory Visit | Attending: Radiation Oncology | Admitting: Radiation Oncology

## 2020-10-02 ENCOUNTER — Ambulatory Visit: Payer: Medicaid Other

## 2020-10-02 DIAGNOSIS — Z510 Encounter for antineoplastic radiation therapy: Secondary | ICD-10-CM | POA: Diagnosis present

## 2020-10-02 DIAGNOSIS — C310 Malignant neoplasm of maxillary sinus: Secondary | ICD-10-CM | POA: Diagnosis not present

## 2020-10-03 ENCOUNTER — Inpatient Hospital Stay: Payer: Medicare Other | Attending: Oncology

## 2020-10-03 ENCOUNTER — Ambulatory Visit
Admission: RE | Admit: 2020-10-03 | Discharge: 2020-10-03 | Disposition: A | Discharge status: Home / Self Care | Payer: Medicaid Other | Source: Ambulatory Visit | Attending: Radiation Oncology | Admitting: Radiation Oncology

## 2020-10-03 ENCOUNTER — Ambulatory Visit: Payer: Medicaid Other

## 2020-10-03 ENCOUNTER — Other Ambulatory Visit: Payer: Self-pay

## 2020-10-03 DIAGNOSIS — F319 Bipolar disorder, unspecified: Secondary | ICD-10-CM | POA: Insufficient documentation

## 2020-10-03 DIAGNOSIS — F17210 Nicotine dependence, cigarettes, uncomplicated: Secondary | ICD-10-CM | POA: Insufficient documentation

## 2020-10-03 DIAGNOSIS — E876 Hypokalemia: Secondary | ICD-10-CM | POA: Insufficient documentation

## 2020-10-03 DIAGNOSIS — C310 Malignant neoplasm of maxillary sinus: Secondary | ICD-10-CM | POA: Insufficient documentation

## 2020-10-03 DIAGNOSIS — J449 Chronic obstructive pulmonary disease, unspecified: Secondary | ICD-10-CM | POA: Insufficient documentation

## 2020-10-03 DIAGNOSIS — Z7901 Long term (current) use of anticoagulants: Secondary | ICD-10-CM | POA: Insufficient documentation

## 2020-10-03 DIAGNOSIS — Z510 Encounter for antineoplastic radiation therapy: Secondary | ICD-10-CM | POA: Diagnosis not present

## 2020-10-03 DIAGNOSIS — I4891 Unspecified atrial fibrillation: Secondary | ICD-10-CM | POA: Insufficient documentation

## 2020-10-03 DIAGNOSIS — Z5111 Encounter for antineoplastic chemotherapy: Secondary | ICD-10-CM | POA: Insufficient documentation

## 2020-10-03 DIAGNOSIS — Z7952 Long term (current) use of systemic steroids: Secondary | ICD-10-CM | POA: Insufficient documentation

## 2020-10-03 DIAGNOSIS — G893 Neoplasm related pain (acute) (chronic): Secondary | ICD-10-CM | POA: Insufficient documentation

## 2020-10-03 DIAGNOSIS — Z79899 Other long term (current) drug therapy: Secondary | ICD-10-CM | POA: Insufficient documentation

## 2020-10-03 DIAGNOSIS — Z8249 Family history of ischemic heart disease and other diseases of the circulatory system: Secondary | ICD-10-CM | POA: Insufficient documentation

## 2020-10-03 DIAGNOSIS — I10 Essential (primary) hypertension: Secondary | ICD-10-CM | POA: Insufficient documentation

## 2020-10-03 NOTE — Progress Notes (Signed)
Nutrition Follow-up:  Patient with squamous cell carcinoma of right maxilla infiltrates right buccal floor of nasal cavity, right paetergoid palatin foxxa, the right orbital apex and right soft palate and palatine tonsil.  Patient receiving concurrent chemotherapy and radiation therapy.   Spoke with patient via phone.  Patient reports that her appetite is "too good".  Reports that she is pureeing foods (ie chicken, hormel complete meals) and they are easier for her to swallow.  Eating soups, grits and adding butter and cheeses to everything.  Drinking milkshakes. Reports little burning when swallowing but nothing she can't manage    Medications: reviewed  Labs: reviewed  Anthropometrics:   Weight 118 lb 14.4 oz on 11/29 decreased from 129 lb (fluid weight gain)  Denies swelling in ankles or legs today   NUTRITION DIAGNOSIS: Inadequate oral intake improving    INTERVENTION:  Encouraged patient to continue eating high calorie, high protein foods Continue changing textures for ease of swallowing.    MONITORING, EVALUATION, GOAL: weight trends, intake   NEXT VISIT: Dec 23 phone  Laci Frenkel B. Zenia Resides, [place], [place] Registered Dietitian [phone] (mobile)

## 2020-10-04 ENCOUNTER — Ambulatory Visit: Payer: Medicaid Other

## 2020-10-04 ENCOUNTER — Inpatient Hospital Stay: Payer: Medicare Other

## 2020-10-04 ENCOUNTER — Ambulatory Visit
Admission: RE | Admit: 2020-10-04 | Discharge: 2020-10-04 | Disposition: A | Discharge status: Home / Self Care | Payer: Medicaid Other | Source: Ambulatory Visit | Attending: Radiation Oncology | Admitting: Radiation Oncology

## 2020-10-04 VITALS — BP 154/76 | HR 53 | Temp 97.2°F | Resp 17

## 2020-10-04 DIAGNOSIS — E876 Hypokalemia: Secondary | ICD-10-CM

## 2020-10-04 DIAGNOSIS — F17210 Nicotine dependence, cigarettes, uncomplicated: Secondary | ICD-10-CM | POA: Diagnosis not present

## 2020-10-04 DIAGNOSIS — E860 Dehydration: Secondary | ICD-10-CM

## 2020-10-04 DIAGNOSIS — C760 Malignant neoplasm of head, face and neck: Secondary | ICD-10-CM

## 2020-10-04 DIAGNOSIS — I4891 Unspecified atrial fibrillation: Secondary | ICD-10-CM | POA: Diagnosis not present

## 2020-10-04 DIAGNOSIS — G893 Neoplasm related pain (acute) (chronic): Secondary | ICD-10-CM | POA: Diagnosis not present

## 2020-10-04 DIAGNOSIS — Z7901 Long term (current) use of anticoagulants: Secondary | ICD-10-CM | POA: Diagnosis not present

## 2020-10-04 DIAGNOSIS — F319 Bipolar disorder, unspecified: Secondary | ICD-10-CM | POA: Diagnosis not present

## 2020-10-04 DIAGNOSIS — J449 Chronic obstructive pulmonary disease, unspecified: Secondary | ICD-10-CM | POA: Diagnosis not present

## 2020-10-04 DIAGNOSIS — I10 Essential (primary) hypertension: Secondary | ICD-10-CM | POA: Diagnosis not present

## 2020-10-04 DIAGNOSIS — Z7952 Long term (current) use of systemic steroids: Secondary | ICD-10-CM | POA: Diagnosis not present

## 2020-10-04 DIAGNOSIS — Z8249 Family history of ischemic heart disease and other diseases of the circulatory system: Secondary | ICD-10-CM | POA: Diagnosis not present

## 2020-10-04 DIAGNOSIS — Z510 Encounter for antineoplastic radiation therapy: Secondary | ICD-10-CM | POA: Diagnosis not present

## 2020-10-04 DIAGNOSIS — Z5111 Encounter for antineoplastic chemotherapy: Secondary | ICD-10-CM | POA: Diagnosis present

## 2020-10-04 DIAGNOSIS — C310 Malignant neoplasm of maxillary sinus: Secondary | ICD-10-CM | POA: Diagnosis present

## 2020-10-04 DIAGNOSIS — Z79899 Other long term (current) drug therapy: Secondary | ICD-10-CM | POA: Diagnosis not present

## 2020-10-04 LAB — COMPREHENSIVE METABOLIC PANEL
ALT: 13 U/L (ref 0–44)
AST: 16 U/L (ref 15–41)
Albumin: 3.6 g/dL (ref 3.5–5.0)
Alkaline Phosphatase: 65 U/L (ref 38–126)
Anion gap: 8 (ref 5–15)
BUN: <5 mg/dL — ABNORMAL LOW (ref 8–23)
CO2: 33 mmol/L — ABNORMAL HIGH (ref 22–32)
Calcium: 8.7 mg/dL — ABNORMAL LOW (ref 8.9–10.3)
Chloride: 94 mmol/L — ABNORMAL LOW (ref 98–111)
Creatinine, Ser: <0.3 mg/dL — ABNORMAL LOW (ref 0.44–1.00)
Glucose, Bld: 78 mg/dL (ref 70–99)
Potassium: 2.8 mmol/L — ABNORMAL LOW (ref 3.5–5.1)
Sodium: 135 mmol/L (ref 135–145)
Total Bilirubin: 0.7 mg/dL (ref 0.3–1.2)
Total Protein: 6.5 g/dL (ref 6.5–8.1)

## 2020-10-04 LAB — MAGNESIUM: Magnesium: 1.4 mg/dL — ABNORMAL LOW (ref 1.7–2.4)

## 2020-10-04 MED ORDER — SODIUM CHLORIDE 0.9 % IV SOLN
Freq: Once | INTRAVENOUS | Status: AC
  Filled 2020-10-04: qty 250

## 2020-10-04 MED ORDER — SODIUM CHLORIDE 0.9% FLUSH
10.0000 mL | Freq: Once | INTRAVENOUS | Status: AC
  Administered 2020-10-04: 10 mL via INTRAVENOUS
  Filled 2020-10-04: qty 10

## 2020-10-04 MED ORDER — HEPARIN SOD (PORK) LOCK FLUSH 100 UNIT/ML IV SOLN
500.0000 [IU] | Freq: Once | INTRAVENOUS | Status: AC
  Administered 2020-10-04: 500 [IU] via INTRAVENOUS
  Filled 2020-10-04: qty 5

## 2020-10-04 MED ORDER — SODIUM CHLORIDE 0.9% FLUSH
10.0000 mL | INTRAVENOUS | Status: DC | PRN
  Filled 2020-10-04: qty 10

## 2020-10-04 MED ORDER — SODIUM CHLORIDE 0.9 % IV SOLN
Freq: Once | INTRAVENOUS | Status: AC
  Filled 2020-10-04: qty 20

## 2020-10-04 NOTE — Progress Notes (Signed)
In [facility] for IVF with potassium and magnesium. Resting comfortably. Discharged from clinic at completion of IVF's. No complaints of any discomfort. Ambulatory.

## 2020-10-07 ENCOUNTER — Inpatient Hospital Stay (HOSPITAL_BASED_OUTPATIENT_CLINIC_OR_DEPARTMENT_OTHER): Payer: Medicare Other | Admitting: Oncology

## 2020-10-07 ENCOUNTER — Inpatient Hospital Stay: Payer: Medicare Other

## 2020-10-07 ENCOUNTER — Ambulatory Visit: Payer: Medicaid Other

## 2020-10-07 ENCOUNTER — Encounter: Payer: Self-pay | Admitting: Oncology

## 2020-10-07 ENCOUNTER — Ambulatory Visit
Admission: RE | Admit: 2020-10-07 | Discharge: 2020-10-07 | Disposition: A | Discharge status: Home / Self Care | Payer: Medicaid Other | Source: Ambulatory Visit | Attending: Radiation Oncology | Admitting: Radiation Oncology

## 2020-10-07 VITALS — BP 163/83 | HR 62 | Temp 98.4°F | Resp 18 | Wt 110.2 lb

## 2020-10-07 DIAGNOSIS — E876 Hypokalemia: Secondary | ICD-10-CM

## 2020-10-07 DIAGNOSIS — Z510 Encounter for antineoplastic radiation therapy: Secondary | ICD-10-CM | POA: Diagnosis not present

## 2020-10-07 DIAGNOSIS — Z5111 Encounter for antineoplastic chemotherapy: Secondary | ICD-10-CM

## 2020-10-07 DIAGNOSIS — C760 Malignant neoplasm of head, face and neck: Secondary | ICD-10-CM

## 2020-10-07 DIAGNOSIS — C310 Malignant neoplasm of maxillary sinus: Secondary | ICD-10-CM

## 2020-10-07 DIAGNOSIS — R634 Abnormal weight loss: Secondary | ICD-10-CM

## 2020-10-07 DIAGNOSIS — Z8679 Personal history of other diseases of the circulatory system: Secondary | ICD-10-CM

## 2020-10-07 LAB — COMPREHENSIVE METABOLIC PANEL
ALT: 15 U/L (ref 0–44)
AST: 19 U/L (ref 15–41)
Albumin: 3.9 g/dL (ref 3.5–5.0)
Alkaline Phosphatase: 73 U/L (ref 38–126)
Anion gap: 12 (ref 5–15)
BUN: 9 mg/dL (ref 8–23)
CO2: 27 mmol/L (ref 22–32)
Calcium: 8.6 mg/dL — ABNORMAL LOW (ref 8.9–10.3)
Chloride: 95 mmol/L — ABNORMAL LOW (ref 98–111)
Creatinine, Ser: 0.56 mg/dL (ref 0.44–1.00)
GFR, Estimated: >60 mL/min (ref >60)
Glucose, Bld: 84 mg/dL (ref 70–99)
Potassium: 3.1 mmol/L — ABNORMAL LOW (ref 3.5–5.1)
Sodium: 134 mmol/L — ABNORMAL LOW (ref 135–145)
Total Bilirubin: 0.9 mg/dL (ref 0.3–1.2)
Total Protein: 6.8 g/dL (ref 6.5–8.1)

## 2020-10-07 LAB — CBC WITH DIFFERENTIAL/PLATELET
Abs Immature Granulocytes: 0 10*3/uL (ref 0.00–0.07)
Basophils Absolute: 0 10*3/uL (ref 0.0–0.1)
Basophils Relative: 1 %
Eosinophils Absolute: 0 10*3/uL (ref 0.0–0.5)
Eosinophils Relative: 1 %
HCT: 30.7 % — ABNORMAL LOW (ref 36.0–46.0)
Hemoglobin: 10.6 g/dL — ABNORMAL LOW (ref 12.0–15.0)
Immature Granulocytes: 0 %
Lymphocytes Relative: 17 %
Lymphs Abs: 0.4 10*3/uL — ABNORMAL LOW (ref 0.7–4.0)
MCH: 34.1 pg — ABNORMAL HIGH (ref 26.0–34.0)
MCHC: 34.5 g/dL (ref 30.0–36.0)
MCV: 98.7 fL (ref 80.0–100.0)
Monocytes Absolute: 0.3 10*3/uL (ref 0.1–1.0)
Monocytes Relative: 15 %
Neutro Abs: 1.4 10*3/uL — ABNORMAL LOW (ref 1.7–7.7)
Neutrophils Relative %: 66 %
Platelets: 299 10*3/uL (ref 150–400)
RBC: 3.11 MIL/uL — ABNORMAL LOW (ref 3.87–5.11)
RDW: 16.7 % — ABNORMAL HIGH (ref 11.5–15.5)
WBC: 2.2 10*3/uL — ABNORMAL LOW (ref 4.0–10.5)
nRBC: 0 % (ref 0.0–0.2)

## 2020-10-07 LAB — MAGNESIUM: Magnesium: 1.4 mg/dL — ABNORMAL LOW (ref 1.7–2.4)

## 2020-10-07 MED ORDER — GABAPENTIN 300 MG PO CAPS
300.0000 mg | ORAL_CAPSULE | Freq: Three times a day (TID) | ORAL | 0 refills | Status: DC
Start: 2020-10-07 — End: 2020-11-27

## 2020-10-07 MED ORDER — ACYCLOVIR 400 MG PO TABS
400.0000 mg | ORAL_TABLET | Freq: Two times a day (BID) | ORAL | 0 refills | Status: DC
Start: 2020-10-07 — End: 2020-11-25

## 2020-10-07 MED ORDER — SODIUM CHLORIDE 0.9 % IV SOLN
43.0000 mg/m2 | Freq: Once | INTRAVENOUS | Status: AC
  Administered 2020-10-07: 66 mg via INTRAVENOUS
  Filled 2020-10-07: qty 66

## 2020-10-07 MED ORDER — SODIUM CHLORIDE 0.9 % IV SOLN
150.0000 mg | Freq: Once | INTRAVENOUS | Status: AC
  Administered 2020-10-07: 150 mg via INTRAVENOUS
  Filled 2020-10-07: qty 5

## 2020-10-07 MED ORDER — SODIUM CHLORIDE 0.9 % IV SOLN
Freq: Once | INTRAVENOUS | Status: AC
  Filled 2020-10-07: qty 2000

## 2020-10-07 MED ORDER — SODIUM CHLORIDE 0.9 % IV SOLN
10.0000 mg | Freq: Once | INTRAVENOUS | Status: AC
  Administered 2020-10-07: 10 mg via INTRAVENOUS
  Filled 2020-10-07: qty 1

## 2020-10-07 MED ORDER — OXYCODONE-ACETAMINOPHEN 5-325 MG PO TABS: 2.0000 | ORAL_TABLET | Freq: Two times a day (BID) | ORAL | 0 refills | Status: DC | PRN

## 2020-10-07 MED ORDER — HEPARIN SOD (PORK) LOCK FLUSH 100 UNIT/ML IV SOLN
INTRAVENOUS | Status: AC
  Filled 2020-10-07: qty 5

## 2020-10-07 MED ORDER — MAGNESIUM CHLORIDE 64 MG PO TBEC
1.0000 | DELAYED_RELEASE_TABLET | Freq: Two times a day (BID) | ORAL | 1 refills | Status: DC
Start: 2020-10-07 — End: 2021-11-28

## 2020-10-07 MED ORDER — PALONOSETRON HCL INJECTION 0.25 MG/5ML
0.2500 mg | Freq: Once | INTRAVENOUS | Status: AC
  Administered 2020-10-07: 0.25 mg via INTRAVENOUS
  Filled 2020-10-07: qty 5

## 2020-10-07 MED ORDER — HEPARIN SOD (PORK) LOCK FLUSH 100 UNIT/ML IV SOLN
500.0000 [IU] | Freq: Once | INTRAVENOUS | Status: AC
  Administered 2020-10-07: 500 [IU] via INTRAVENOUS
  Filled 2020-10-07: qty 5

## 2020-10-07 MED ORDER — SODIUM CHLORIDE 0.9 % IV SOLN
Freq: Once | INTRAVENOUS | Status: AC
  Filled 2020-10-07: qty 250

## 2020-10-07 MED ORDER — POTASSIUM CHLORIDE 20 MEQ/15ML (10%) PO SOLN: 40.0000 meq | Freq: Every day | ORAL | 0 refills | Status: DC

## 2020-10-07 MED ORDER — SODIUM CHLORIDE 0.9% FLUSH
10.0000 mL | INTRAVENOUS | Status: DC | PRN
  Administered 2020-10-07: 10 mL via INTRAVENOUS
  Filled 2020-10-07: qty 10

## 2020-10-07 NOTE — Progress Notes (Signed)
Hematology/Oncology follow up note [facility] [phone] [phone]   Patient Care Team: Pcp, No as PCP - General Rockey Situ, Kathlene November, MD as PCP - Cardiology (Cardiology) Earlie Server, MD as Consulting Physician (Oncology)  CHIEF COMPLAINTS/REASON FOR VISIT:  Follow-up for head and neck cancer  HISTORY OF PRESENTING ILLNESS:   Shannon Schroeder is a  64 y.o.  female with PMH listed below was seen in consultation at the request of ER Dr. [name] for evaluation of abnormal CT scan.  07/10/2020 she presented emergency room for evaluation of right-sided facial swelling and pain in the right side of her mouth for the past 6 weeks.  It started in her mouth with a small ulcer which progressively got worse.  She wears denture.  Not able to chew well.  She eats soft food. She was found to have right upper gum also with yellowish discharge noted. 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  Avoid piloting.,  Right orbital apex, also the right soft palate and palate and tonsil.  Furthermore there is evidence of intracranial extension along the right V2 at the foramen rotundum Small suspicious right level 1A lymph node measuring 7 mm.  Separate small indeterminate but suspicious enhancing soft tissue nodule of the left sublingual space.  Patient was referred to oncology for further evaluation management. Patient denies fever, chills, nausea vomiting diarrhea shortness of breath or cough.  Reports 10 out of 10 severe pain of right face and numbness.  She also has headache. Reports unintentional weight loss  She reports no contactable family members.  She is in the process of pointing her roommate [name] to be her power of attorney.  She also wants to record today's conversation for her roommate  [name]. Patient has 50-pack-year smoking history.  # seen by ENT Dr.Juengel and had a biopsy. Biopsy was sent to [facility] and the final pathology was positive for invasive squamous cell carcinoma, well-differentiated, keratinizing. 07/17/2020, brain MRI showed large right facial mass with right maxillary erosion involving the maxillary sinus, hard palate and pterygoid plates and muscles.  Perineural spreading along the multiple branches of the maxillary division of the right trigeminal nerve at the infraorbital canal, pterygopalatine fossa and foramen rotundum. 07/17/2020, PET scan showed Intense FDG uptake is associated with the large enhancing tumor which involves the right maxilla, right nasal cavity, right pterygoid palatine fossa, right orbital apex and right soft tissue palate and palatine tonsil. 2. Mild nonspecific FDG uptake is associated with the recently characterized suspicious right level 1A lymph node which measures 7 mm and has an SUV max of 2.12.  No signs of distant metastasis.  Aortic atherosclerosis.  Patient also reports a history of hypertension, dysrhythmia, history of V. tach.  Patient previously was on lisinopril, atenolol, amlodipine for which she is no longer taking at this point due to lack of medication coverage.  She also does not have any primary care provider. 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  On average she says she is takes 2 to 3 pills/day.  She also takes gabapentin 300 mg 3 times daily  # new onset of atrial fibrillation with rapid ventricular  response.  Patient was started on metoprolol.  Eliquis for anticoagulation.  # 09/06/2020- 09/11/2020 admitted due to A. fib, hypotension, multifocal pneumonia.  Patient was treated with IV antibiotics.  Cardiology saw the patient during admission and  added amiodarone.  Patient was discharged on 5 days course of oral antibiotics, continued on metoprolol and amiodarone for atrial fibrillation.  # Dysphagia, she declined PET tube placment.   INTERVAL HISTORY Shannon Schroeder is a 64 y.o. female who has above history reviewed by me today presents for follow up visit for management of head and neck cancer, post hospitalization follow up  Problems and complaints are listed below:  Feeling ok. No fever, chills, SOB.  # Dysphagia: Patient continues to have dysphagia problems.  She declines PET tube placement.  She has had a difficulties swallowing her potassium pills. # Cough denies any cough. # weight loss lost another 8 pounds since last visit.   Review of Systems  Constitutional: Positive for fatigue and unexpected weight change. Negative for appetite change, chills and fever.  HENT:   Negative for hearing loss, mouth sores and voice change.        Facial swelling, pain and numbness improved.  Eyes: Negative for eye problems.  Respiratory: Negative for chest tightness, cough and shortness of breath.   Cardiovascular: Negative for chest pain and palpitations.  Gastrointestinal: Negative for abdominal distention, abdominal pain, blood in stool, constipation, nausea and vomiting.       Dysphagia  Endocrine: Negative for hot flashes.  Genitourinary: Negative for difficulty urinating and frequency.   Musculoskeletal: Negative for arthralgias.  Skin: Negative for itching and rash.  Neurological: Negative for extremity weakness and headaches.  Hematological: Negative for adenopathy.  Psychiatric/Behavioral: Negative for confusion.    MEDICAL HISTORY:  Past Medical History:  Diagnosis Date  . 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   Marland Kitchen Anxiety   . Arthritis   . Bipolar disorder ([place])   . COPD (chronic obstructive pulmonary disease) ([place])    NO INHALERS  . Depression   . Dysrhythmia     H/O V TACH  . Head and neck cancer ([place]) 07/29/2020  . Headache    H/O MIGRAINES  . Hypertension   . Seizures ([place])    X1 AFTER FALL    SURGICAL HISTORY: Past Surgical History:  Procedure Laterality Date  . ABDOMINAL HYSTERECTOMY     partial  . BREAST SURGERY     biopsy  . CARDIAC CATHETERIZATION     X 2  . CARPAL TUNNEL RELEASE Right   . CARPECTOMY HAND Right   . CATARACT EXTRACTION W/PHACO Left 03/08/2018   Procedure: CATARACT EXTRACTION PHACO AND INTRAOCULAR LENS PLACEMENT (IOC);  Surgeon: Birder Robson, MD;  Location: [facility] ORS;  Service: Ophthalmology;  Laterality: Left;  [place] 00:55 AP% 13.5 CDE 7.52 Fluid pack lot # [id number]  . CATARACT EXTRACTION W/PHACO Right 04/05/2018   Procedure: CATARACT EXTRACTION PHACO AND INTRAOCULAR LENS PLACEMENT (IOC);  Surgeon: Birder Robson, MD;  Location: [facility] ORS;  Service: Ophthalmology;  Laterality: Right;  [place] 00:36 AP% 15.9 CDE 5.72 Fluid pack lot # [id number]  . CEREBRAL ANEURYSM REPAIR    . CHOLECYSTECTOMY    . COLONOSCOPY WITH PROPOFOL N/A 04/26/2019   Procedure: COLONOSCOPY WITH PROPOFOL;  Surgeon: Jonathon Bellows, MD;  Location: [facility] ENDOSCOPY;  Service: Gastroenterology;  Laterality: N/A;  . PORTACATH PLACEMENT Left 08/05/2020   Procedure: INSERTION PORT-A-CATH;  Surgeon: Herbert Pun, MD;  Location: [facility]  ORS;  Service: General;  Laterality: Left;  . TUBAL LIGATION      SOCIAL HISTORY: Social History   Socioeconomic History  . Marital status: Divorced    Spouse name: Not on file  . Number of children: Not on file  . Years of education: 24  . Highest education level: Bachelor's degree (e.g., BA, AB, BS)  Occupational History  . Occupation: farmer  Tobacco Use  . Smoking status: Current Every Day Smoker    Packs/day: 0.25    Years: 50.00    Pack years: 12.50    Types: Cigarettes  . Smokeless tobacco: Never Used  Vaping Use  . Vaping Use: Never used  Substance and Sexual Activity  . Alcohol use: No  . Drug  use: No  . Sexual activity: Not Currently  Other Topics Concern  . Not on file  Social History Narrative   Pt lives at [facility].    Social Determinants of Health   Financial Resource Strain:   . Difficulty of Paying Living Expenses: Not on file  Food Insecurity:   . Worried About Charity fundraiser in the Last Year: Not on file  . Ran Out of Food in the Last Year: Not on file  Transportation Needs:   . Lack of Transportation (Medical): Not on file  . Lack of Transportation (Non-Medical): Not on file  Physical Activity:   . Days of Exercise per Week: Not on file  . Minutes of Exercise per Session: Not on file  Stress:   . Feeling of Stress : Not on file  Social Connections:   . Frequency of Communication with Friends and Family: Not on file  . Frequency of Social Gatherings with Friends and Family: Not on file  . Attends Religious Services: Not on file  . Active Member of Clubs or Organizations: Not on file  . Attends Archivist Meetings: Not on file  . Marital Status: Not on file  Intimate Partner Violence:   . Fear of Current or Ex-Partner: Not on file  . Emotionally Abused: Not on file  . Physically Abused: Not on file  . Sexually Abused: Not on file    FAMILY HISTORY: Family History  Problem Relation Age of Onset  . Hypertension Mother   . Heart failure Mother   . Hypertension Brother   . Stroke Brother   . Hypertension Son   . Emphysema Maternal Aunt   . Hypertension Paternal Aunt   . Hypertension Paternal Uncle   . Hypertension Maternal Grandmother   . Hypertension Paternal Grandmother     ALLERGIES:  is allergic to aspirin, belladonna alkaloids, fluoxetine hcl, naproxen, phenobarbital, and prozac [fluoxetine hcl].  MEDICATIONS:  Current Outpatient Medications  Medication Sig Dispense Refill  . acyclovir (ZOVIRAX) 400 MG tablet Take 1 tablet (400 mg total) by mouth 2 (two) times daily. 60 tablet 0  . amiodarone (PACERONE) 200 MG tablet  Take 1 tablet (200 mg total) by mouth daily. 90 tablet 1  . apixaban (ELIQUIS) 5 MG TABS tablet Take 1 tablet (5 mg total) by mouth 2 (two) times daily. 60 tablet 0  . chlorhexidine (PERIDEX) 0.12 % solution Use as directed 10 mLs in the mouth or throat 2 (two) times daily. Swish and spit 473 mL 1  . dexamethasone (DECADRON) 4 MG tablet Take 1 tablet (4 mg total) by mouth daily. 7 tablet 0  . docusate sodium (COLACE) 100 MG capsule Take 1 capsule (100 mg total) by mouth daily. [place]  capsule 1  . feeding supplement, ENSURE ENLIVE, (ENSURE ENLIVE) LIQD Take 237 mLs by mouth 3 (three) times daily between meals. 237 mL 12  . fentaNYL (DURAGESIC) 25 MCG/HR Place 1 patch onto the skin every 3 (three) days. 7 patch 0  . furosemide (LASIX) 20 MG tablet Take 1 tablet (20 mg total) by mouth daily as needed (for lower extremity swelling). 90 tablet 1  . gabapentin (NEURONTIN) 300 MG capsule Take 1 capsule (300 mg total) by mouth 3 (three) times daily. 90 capsule 0  . lidocaine-prilocaine (EMLA) cream Apply to affected area once (Patient taking differently: Apply 1 application topically as needed [facility]). Apply to affected area once) 30 g 3  . lisinopril (ZESTRIL) 20 MG tablet Take 1 tablet (20 mg total) by mouth daily.    . magnesium chloride (SLOW-MAG) 64 MG TBEC SR tablet Take 1 tablet (64 mg total) by mouth daily. 30 tablet 1  . Melatonin 10 MG TABS Take 10 mg by mouth at bedtime as needed (sleep).     . Multiple Vitamins-Minerals (CENTRUM SILVER PO) Take 1 tablet by mouth daily. Gummie    . nicotine (NICODERM CQ - DOSED IN MG/24 HOURS) 21 mg/24hr patch Place 1 patch (21 mg total) onto the skin daily. 28 patch 0  . oxyCODONE-acetaminophen (PERCOCET) 5-325 MG tablet Take 2 tablets by mouth every 8 (eight) hours as needed for severe pain. 60 tablet 0  . polyethylene glycol powder (GLYCOLAX/MIRALAX) 17 GM/SCOOP powder Take 17 g by mouth daily as needed for mild constipation or moderate constipation. 255 g 0  .  potassium chloride SA (KLOR-CON) 20 MEQ tablet Take 2 tablets (40 mEq total) by mouth daily. 60 tablet 0  . prochlorperazine (COMPAZINE) 10 MG tablet Take 1 tablet (10 mg total) by mouth every 6 (six) hours as needed (Nausea or vomiting). 30 tablet 1   No current facility-administered medications for this visit.   Facility-Administered Medications Ordered in Other Visits  Medication Dose Route Frequency Provider Last Rate Last Admin  . heparin lock flush 100 unit/mL  500 Units Intravenous Once Earlie Server, MD      . sodium chloride flush (NS) 0.9 % injection 10 mL  10 mL Intravenous PRN Earlie Server, MD   10 mL at 10/07/20 0813     PHYSICAL EXAMINATION: ECOG PERFORMANCE STATUS: 1 - Symptomatic but completely ambulatory Vitals:   10/07/20 0838  Pulse: 63  Resp: 18  Temp: 98.4 F (36.9 C)   Filed Weights   10/07/20 0838  Weight: 110 lb 3.2 oz (50 kg)    Physical Exam Constitutional:      General: She is not in acute distress.    Comments: Thin built.  HENT:     Head: Normocephalic and atraumatic.     Mouth/Throat:     Comments: No thrush.  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 Cardiovascular:     Rate and Rhythm: Normal rate and regular rhythm.     Heart sounds: Normal heart sounds.  Pulmonary:     Effort: Pulmonary effort is normal. No respiratory distress.     Breath sounds: No wheezing.  Abdominal:     General: Bowel sounds are normal. There is no distension.     Palpations: Abdomen is soft.  Musculoskeletal:        General: No deformity. Normal range of motion.     Cervical back:  Normal range of motion and neck supple.  Skin:    General: Skin is warm and dry.     Findings: No erythema or rash.  Neurological:     Mental Status: She is alert and oriented to person, place, and time. Mental status is at baseline.     Cranial Nerves: No cranial nerve deficit.      Coordination: Coordination normal.  Psychiatric:        Mood and Affect: Mood normal.     LABORATORY DATA:  I have reviewed the data as listed Lab Results  Component Value Date   WBC 2.2 (L) 10/07/2020   HGB 10.6 (L) 10/07/2020   HCT 30.7 (L) 10/07/2020   MCV 98.7 10/07/2020   PLT 299 10/07/2020   Recent Labs    07/10/20 1311 07/10/20 1311 07/12/20 1204 08/08/20 0821 09/30/20 0812 10/04/20 0927 10/07/20 0807  NA 136   < > 137   < > 139 135 134*  K 2.9*   < > 4.0   < > 2.9* 2.8* 3.1*  CL 97*   < > 98   < > 99 94* 95*  CO2 28   < > 27   < > 29 33* 27  GLUCOSE 100*   < > 114*   < > 85 78 84  BUN 13   < > 8   < > 7* <5* 9  CREATININE 0.43*   < > 0.42*   < > 0.49 <0.30* 0.56  CALCIUM 8.9   < > 9.0   < > 8.5* 8.7* 8.6*  GFRNONAA >60   < > >60   < > >60 NOT CALCULATED >60  GFRAA >60  --  >60  --   --   --   --   PROT 7.0  --   --    < > 6.4* 6.5 6.8  ALBUMIN 3.7  --   --    < > 3.6 3.6 3.9  AST 20  --   --    < > 22 16 19   ALT 16  --   --    < > 14 13 15   ALKPHOS 106  --   --    < > 67 65 73  BILITOT 0.5  --   --    < > 0.6 0.7 0.9   < > = values in this interval not displayed.   Iron/TIBC/Ferritin/ %Sat    Component Value Date/Time   IRON 46 09/09/2020 0938   IRON 37 05/03/2018 1826   TIBC 210 (L) 09/09/2020 0938   FERRITIN 136 09/09/2020 0938   FERRITIN 52 05/03/2018 1826   IRONPCTSAT 22 09/09/2020 0938      RADIOGRAPHIC STUDIES: I have personally reviewed the radiological images as listed and agreed with the findings in the report. No results found.    ASSESSMENT & PLAN:  1. Encounter for antineoplastic chemotherapy   2. Hypomagnesemia   3. Hypokalemia   4. 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[place])   5. History of atrial fibrillation   6. 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  Labs reviewed and discussed with patient Proceed with last dose of cisplatin today. Recommend patient continue mouthwash, acyclovir 400 mg twice a day for  HSV prophylaxis prescription was refilled.  # severe hypokalemia, patient will received  IVF with 69meq KCl today.  Continue oral potassium to 67meq daily.  I switched her to potassium chloride elixir  # Hypomagnesia,  Mg is stable continue oral Magnesium, increased dose to 1 tablet twice daily..  Patient will get 4 g of magnesium along with fluid today.  #Poor oral intake, dysphagia IV fluid hydration. She declines PET tube.  We will schedule patient for another IV hydration/later this week.  #Atrial fibrillation.  Continue amiodarone and metoprolol.  And Eliquis   ##neoplasm induced pain, continue fentanyl patch 17mcg/h Q72 hours. .  Continue garbapentine 300mg  TID.  2 Percocet tablets patient reports that she uses occasionally for pain.  She wants another refill of the medication.  All questions were answered. The patient knows to call the clinic with any problems questions or concerns.   Return of visit: 1 week  Earlie Server, MD, PhD Hematology Oncology [facility] Pager- [phone] 10/07/2020

## 2020-10-07 NOTE — Progress Notes (Signed)
Patient here for follow up and treatment. Pt reports that she needs a refill on "all" medications that Dr. Tasia Catchings has prescribed. She will discuss with provider to see which ones need to be refilled.

## 2020-10-08 ENCOUNTER — Telehealth: Payer: Self-pay | Admitting: Licensed Clinical Social Worker

## 2020-10-08 ENCOUNTER — Ambulatory Visit
Admission: RE | Admit: 2020-10-08 | Discharge: 2020-10-08 | Disposition: A | Discharge status: Home / Self Care | Payer: Medicaid Other | Source: Ambulatory Visit | Attending: Radiation Oncology | Admitting: Radiation Oncology

## 2020-10-08 DIAGNOSIS — Z510 Encounter for antineoplastic radiation therapy: Secondary | ICD-10-CM | POA: Diagnosis not present

## 2020-10-08 NOTE — Telephone Encounter (Signed)
Appointment made for follow up for Dr. Kathyrn Sheriff for 12/22 at 2:30 in the [facility] clinic

## 2020-10-09 ENCOUNTER — Ambulatory Visit
Admission: RE | Admit: 2020-10-09 | Discharge: 2020-10-09 | Disposition: A | Discharge status: Home / Self Care | Payer: Medicaid Other | Source: Ambulatory Visit | Attending: Radiation Oncology | Admitting: Radiation Oncology

## 2020-10-09 DIAGNOSIS — Z510 Encounter for antineoplastic radiation therapy: Secondary | ICD-10-CM | POA: Diagnosis not present

## 2020-10-10 ENCOUNTER — Other Ambulatory Visit: Payer: Self-pay

## 2020-10-10 ENCOUNTER — Inpatient Hospital Stay: Payer: Medicare Other

## 2020-10-10 ENCOUNTER — Ambulatory Visit
Admission: RE | Admit: 2020-10-10 | Discharge: 2020-10-10 | Disposition: A | Discharge status: Home / Self Care | Payer: Medicaid Other | Source: Ambulatory Visit | Attending: Radiation Oncology | Admitting: Radiation Oncology

## 2020-10-10 DIAGNOSIS — E860 Dehydration: Secondary | ICD-10-CM

## 2020-10-10 DIAGNOSIS — Z510 Encounter for antineoplastic radiation therapy: Secondary | ICD-10-CM | POA: Diagnosis not present

## 2020-10-10 DIAGNOSIS — Z5111 Encounter for antineoplastic chemotherapy: Secondary | ICD-10-CM | POA: Diagnosis not present

## 2020-10-10 DIAGNOSIS — C760 Malignant neoplasm of head, face and neck: Secondary | ICD-10-CM

## 2020-10-10 LAB — CBC WITH DIFFERENTIAL/PLATELET
Abs Immature Granulocytes: 0.01 10*3/uL (ref 0.00–0.07)
Basophils Absolute: 0 10*3/uL (ref 0.0–0.1)
Basophils Relative: 1 %
Eosinophils Absolute: 0 10*3/uL (ref 0.0–0.5)
Eosinophils Relative: 0 %
HCT: 29.6 % — ABNORMAL LOW (ref 36.0–46.0)
Hemoglobin: 10.1 g/dL — ABNORMAL LOW (ref 12.0–15.0)
Immature Granulocytes: 0 %
Lymphocytes Relative: 8 %
Lymphs Abs: 0.3 10*3/uL — ABNORMAL LOW (ref 0.7–4.0)
MCH: 34.7 pg — ABNORMAL HIGH (ref 26.0–34.0)
MCHC: 34.1 g/dL (ref 30.0–36.0)
MCV: 101.7 fL — ABNORMAL HIGH (ref 80.0–100.0)
Monocytes Absolute: 0.6 10*3/uL (ref 0.1–1.0)
Monocytes Relative: 19 %
Neutro Abs: 2.2 10*3/uL (ref 1.7–7.7)
Neutrophils Relative %: 72 %
Platelets: 331 10*3/uL (ref 150–400)
RBC: 2.91 MIL/uL — ABNORMAL LOW (ref 3.87–5.11)
RDW: 17.9 % — ABNORMAL HIGH (ref 11.5–15.5)
WBC: 3.1 10*3/uL — ABNORMAL LOW (ref 4.0–10.5)
nRBC: 0 % (ref 0.0–0.2)

## 2020-10-10 LAB — COMPREHENSIVE METABOLIC PANEL
ALT: 12 U/L (ref 0–44)
AST: 19 U/L (ref 15–41)
Albumin: 3.7 g/dL (ref 3.5–5.0)
Alkaline Phosphatase: 74 U/L (ref 38–126)
Anion gap: 11 (ref 5–15)
BUN: 12 mg/dL (ref 8–23)
CO2: 27 mmol/L (ref 22–32)
Calcium: 8.6 mg/dL — ABNORMAL LOW (ref 8.9–10.3)
Chloride: 98 mmol/L (ref 98–111)
Creatinine, Ser: 0.45 mg/dL (ref 0.44–1.00)
GFR, Estimated: >60 mL/min (ref >60)
Glucose, Bld: 102 mg/dL — ABNORMAL HIGH (ref 70–99)
Potassium: 3.5 mmol/L (ref 3.5–5.1)
Sodium: 136 mmol/L (ref 135–145)
Total Bilirubin: 0.6 mg/dL (ref 0.3–1.2)
Total Protein: 6.4 g/dL — ABNORMAL LOW (ref 6.5–8.1)

## 2020-10-10 LAB — MAGNESIUM: Magnesium: 1.4 mg/dL — ABNORMAL LOW (ref 1.7–2.4)

## 2020-10-10 MED ORDER — HEPARIN SOD (PORK) LOCK FLUSH 100 UNIT/ML IV SOLN
500.0000 [IU] | Freq: Once | INTRAVENOUS | Status: AC
  Administered 2020-10-10: 500 [IU] via INTRAVENOUS
  Filled 2020-10-10: qty 5

## 2020-10-10 MED ORDER — MAGNESIUM SULFATE 4 GM/100ML IV SOLN: 4.0000 g | Freq: Once | INTRAVENOUS | Status: DC

## 2020-10-10 MED ORDER — SODIUM CHLORIDE 0.9% FLUSH
10.0000 mL | INTRAVENOUS | Status: AC | PRN
  Administered 2020-10-10: 10 mL via INTRAVENOUS
  Filled 2020-10-10: qty 10

## 2020-10-10 MED ORDER — SODIUM CHLORIDE 0.9 % IV SOLN
Freq: Once | INTRAVENOUS | Status: AC
  Filled 2020-10-10: qty 1000

## 2020-10-10 NOTE — Progress Notes (Signed)
In [facility] infusion clinic for IVF with magnesium. Pt will complete radiation treatments on Friday. Tolerated treatment well. Ambulatory at discharge. No complaints. VSS.

## 2020-10-11 ENCOUNTER — Ambulatory Visit: Payer: Self-pay

## 2020-10-11 ENCOUNTER — Ambulatory Visit
Admission: RE | Admit: 2020-10-11 | Discharge: 2020-10-11 | Disposition: A | Discharge status: Home / Self Care | Payer: Medicaid Other | Source: Ambulatory Visit | Attending: Radiation Oncology | Admitting: Radiation Oncology

## 2020-10-11 DIAGNOSIS — Z510 Encounter for antineoplastic radiation therapy: Secondary | ICD-10-CM | POA: Diagnosis not present

## 2020-10-14 ENCOUNTER — Telehealth: Payer: Self-pay | Admitting: *Deleted

## 2020-10-14 ENCOUNTER — Inpatient Hospital Stay: Payer: Medicare Other

## 2020-10-14 ENCOUNTER — Inpatient Hospital Stay (HOSPITAL_BASED_OUTPATIENT_CLINIC_OR_DEPARTMENT_OTHER): Payer: Medicare Other | Admitting: Oncology

## 2020-10-14 ENCOUNTER — Encounter: Payer: Self-pay | Admitting: Oncology

## 2020-10-14 VITALS — BP 102/68 | HR 96 | Temp 98.4°F | Resp 18 | Wt 105.7 lb

## 2020-10-14 DIAGNOSIS — E860 Dehydration: Secondary | ICD-10-CM | POA: Insufficient documentation

## 2020-10-14 DIAGNOSIS — E876 Hypokalemia: Secondary | ICD-10-CM | POA: Diagnosis not present

## 2020-10-14 DIAGNOSIS — C760 Malignant neoplasm of head, face and neck: Secondary | ICD-10-CM

## 2020-10-14 DIAGNOSIS — Z8679 Personal history of other diseases of the circulatory system: Secondary | ICD-10-CM | POA: Diagnosis not present

## 2020-10-14 DIAGNOSIS — R634 Abnormal weight loss: Secondary | ICD-10-CM

## 2020-10-14 DIAGNOSIS — E871 Hypo-osmolality and hyponatremia: Secondary | ICD-10-CM

## 2020-10-14 DIAGNOSIS — Z5111 Encounter for antineoplastic chemotherapy: Secondary | ICD-10-CM

## 2020-10-14 DIAGNOSIS — G893 Neoplasm related pain (acute) (chronic): Secondary | ICD-10-CM

## 2020-10-14 LAB — CBC WITH DIFFERENTIAL/PLATELET
Abs Immature Granulocytes: 0.01 10*3/uL (ref 0.00–0.07)
Basophils Absolute: 0 10*3/uL (ref 0.0–0.1)
Basophils Relative: 1 %
Eosinophils Absolute: 0 10*3/uL (ref 0.0–0.5)
Eosinophils Relative: 0 %
HCT: 32 % — ABNORMAL LOW (ref 36.0–46.0)
Hemoglobin: 11.2 g/dL — ABNORMAL LOW (ref 12.0–15.0)
Immature Granulocytes: 0 %
Lymphocytes Relative: 8 %
Lymphs Abs: 0.3 10*3/uL — ABNORMAL LOW (ref 0.7–4.0)
MCH: 34.5 pg — ABNORMAL HIGH (ref 26.0–34.0)
MCHC: 35 g/dL (ref 30.0–36.0)
MCV: 98.5 fL (ref 80.0–100.0)
Monocytes Absolute: 0.6 10*3/uL (ref 0.1–1.0)
Monocytes Relative: 17 %
Neutro Abs: 2.5 10*3/uL (ref 1.7–7.7)
Neutrophils Relative %: 74 %
Platelets: 348 10*3/uL (ref 150–400)
RBC: 3.25 MIL/uL — ABNORMAL LOW (ref 3.87–5.11)
RDW: 17.7 % — ABNORMAL HIGH (ref 11.5–15.5)
WBC: 3.4 10*3/uL — ABNORMAL LOW (ref 4.0–10.5)
nRBC: 0 % (ref 0.0–0.2)

## 2020-10-14 LAB — COMPREHENSIVE METABOLIC PANEL
ALT: 15 U/L (ref 0–44)
AST: 23 U/L (ref 15–41)
Albumin: 3.9 g/dL (ref 3.5–5.0)
Alkaline Phosphatase: 71 U/L (ref 38–126)
Anion gap: 11 (ref 5–15)
BUN: 13 mg/dL (ref 8–23)
CO2: 29 mmol/L (ref 22–32)
Calcium: 8.8 mg/dL — ABNORMAL LOW (ref 8.9–10.3)
Chloride: 93 mmol/L — ABNORMAL LOW (ref 98–111)
Creatinine, Ser: 0.54 mg/dL (ref 0.44–1.00)
GFR, Estimated: >60 mL/min (ref >60)
Glucose, Bld: 113 mg/dL — ABNORMAL HIGH (ref 70–99)
Potassium: 2.9 mmol/L — ABNORMAL LOW (ref 3.5–5.1)
Sodium: 133 mmol/L — ABNORMAL LOW (ref 135–145)
Total Bilirubin: 0.8 mg/dL (ref 0.3–1.2)
Total Protein: 6.7 g/dL (ref 6.5–8.1)

## 2020-10-14 LAB — MAGNESIUM: Magnesium: 1.4 mg/dL — ABNORMAL LOW (ref 1.7–2.4)

## 2020-10-14 MED ORDER — DEXAMETHASONE SODIUM PHOSPHATE 10 MG/ML IJ SOLN
10.0000 mg | Freq: Once | INTRAMUSCULAR | Status: AC
  Administered 2020-10-14: 14:00:00 10 mg via INTRAVENOUS

## 2020-10-14 MED ORDER — HEPARIN SOD (PORK) LOCK FLUSH 100 UNIT/ML IV SOLN
500.0000 [IU] | Freq: Once | INTRAVENOUS | Status: AC | PRN
  Administered 2020-10-14: 16:00:00 500 [IU]
  Filled 2020-10-14: qty 5

## 2020-10-14 MED ORDER — ONDANSETRON 8 MG PO TBDP: 8.0000 mg | ORAL_TABLET | Freq: Three times a day (TID) | ORAL | 0 refills | Status: DC | PRN

## 2020-10-14 MED ORDER — SODIUM CHLORIDE 0.9 % IV SOLN
Freq: Once | INTRAVENOUS | Status: AC
  Filled 2020-10-14: qty 250

## 2020-10-14 MED ORDER — SODIUM CHLORIDE 0.9 % IV SOLN
Freq: Once | INTRAVENOUS | Status: AC
  Filled 2020-10-14: qty 20

## 2020-10-14 NOTE — Progress Notes (Signed)
Received IVF's with electrolytes. IV dexamethasone for nausea and low appetite. Tolerated treatment. No complaints at discharge from clinic. VSS. Ambulatory.

## 2020-10-14 NOTE — Progress Notes (Signed)
Hematology/Oncology follow up note [facility] [phone] [phone]   Patient Care Team: Pcp, No as PCP - General Rockey Situ, Kathlene November, MD as PCP - Cardiology (Cardiology) Earlie Server, MD as Consulting Physician (Oncology)  CHIEF COMPLAINTS/REASON FOR VISIT:  Follow-up for head and neck cancer  HISTORY OF PRESENTING ILLNESS:   Shannon Schroeder is a  64 y.o.  female with PMH listed below was seen in consultation at the request of ER Dr. [name] for evaluation of abnormal CT scan.  07/10/2020 she presented emergency room for evaluation of right-sided facial swelling and pain in the right side of her mouth for the past 6 weeks.  It started in her mouth with a small ulcer which progressively got worse.  She wears denture.  Not able to chew well.  She eats soft food. She was found to have right upper gum also with yellowish discharge noted. 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  Avoid piloting.,  Right orbital apex, also the right soft palate and palate and tonsil.  Furthermore there is evidence of intracranial extension along the right V2 at the foramen rotundum Small suspicious right level 1A lymph node measuring 7 mm.  Separate small indeterminate but suspicious enhancing soft tissue nodule of the left sublingual space.  Patient was referred to oncology for further evaluation management. Patient denies fever, chills, nausea vomiting diarrhea shortness of breath or cough.  Reports 10 out of 10 severe pain of right face and numbness.  She also has headache. Reports unintentional weight loss  She reports no contactable family members.  She is in the process of pointing her roommate [name] to be her power of attorney.  She also wants to record today's conversation for her roommate  [name]. Patient has 50-pack-year smoking history.  # seen by ENT Dr.Juengel and had a biopsy. Biopsy was sent to [facility] and the final pathology was positive for invasive squamous cell carcinoma, well-differentiated, keratinizing. 07/17/2020, brain MRI showed large right facial mass with right maxillary erosion involving the maxillary sinus, hard palate and pterygoid plates and muscles.  Perineural spreading along the multiple branches of the maxillary division of the right trigeminal nerve at the infraorbital canal, pterygopalatine fossa and foramen rotundum. 07/17/2020, PET scan showed Intense FDG uptake is associated with the large enhancing tumor which involves the right maxilla, right nasal cavity, right pterygoid palatine fossa, right orbital apex and right soft tissue palate and palatine tonsil. 2. Mild nonspecific FDG uptake is associated with the recently characterized suspicious right level 1A lymph node which measures 7 mm and has an SUV max of 2.12.  No signs of distant metastasis.  Aortic atherosclerosis.  Patient also reports a history of hypertension, dysrhythmia, history of V. tach.  Patient previously was on lisinopril, atenolol, amlodipine for which she is no longer taking at this point due to lack of medication coverage.  She also does not have any primary care provider. 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  On average she says she is takes 2 to 3 pills/day.  She also takes gabapentin 300 mg 3 times daily  # new onset of atrial fibrillation with rapid ventricular  response.  Patient was started on metoprolol.  Eliquis for anticoagulation.  # 09/06/2020- 09/11/2020 admitted due to A. fib, hypotension, multifocal pneumonia.  Patient was treated with IV antibiotics.  Cardiology saw the patient during admission and  added amiodarone.  Patient was discharged on 5 days course of oral antibiotics, continued on metoprolol and amiodarone for atrial fibrillation.  # Dysphagia, she declined PET tube placment.  #08/08/2020-10/11/2020 concurrent cisplatin and radiation.  INTERVAL HISTORY Shannon Schroeder is a 64 y.o. female who has above history reviewed by me today presents for follow up visit for management of head and neck cancer, post hospitalization follow up  Problems and complaints are listed below:  Patient reports mouth sore. Worsening of dysphagia.  She has difficulty swallowing big pills as well as food which makes her nauseated she has lost Another 5 pounds since last week.  She reports that she is able to swallow Eliquis Denies any fever, chills, diarrhea, cough or shortness of breath. Review of Systems  Constitutional: Positive for fatigue and unexpected weight change. Negative for appetite change, chills and fever.  HENT:   Positive for mouth sores. Negative for hearing loss and voice change.        Facial swelling, pain and numbness improved.  Eyes: Negative for eye problems.  Respiratory: Negative for chest tightness, cough and shortness of breath.   Cardiovascular: Negative for chest pain and palpitations.  Gastrointestinal: Negative for abdominal distention, abdominal pain, blood in stool, constipation, nausea and vomiting.       Dysphagia  Endocrine: Negative for hot flashes.  Genitourinary: Negative for difficulty urinating and frequency.   Musculoskeletal: Negative for arthralgias.  Skin: Negative for itching and rash.  Neurological: Negative for extremity weakness and headaches.  Hematological: Negative for adenopathy.  Psychiatric/Behavioral: Negative for confusion.    MEDICAL HISTORY:  Past Medical History:  Diagnosis Date  . 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   Marland Kitchen Anxiety   . Arthritis   . Bipolar disorder ([place])   . COPD  (chronic obstructive pulmonary disease) ([place])    NO INHALERS  . Depression   . Dysrhythmia    H/O V TACH  . Head and neck cancer ([place]) 07/29/2020  . Headache    H/O MIGRAINES  . Hypertension   . Seizures ([place])    X1 AFTER FALL    SURGICAL HISTORY: Past Surgical History:  Procedure Laterality Date  . ABDOMINAL HYSTERECTOMY     partial  . BREAST SURGERY     biopsy  . CARDIAC CATHETERIZATION     X 2  . CARPAL TUNNEL RELEASE Right   . CARPECTOMY HAND Right   . CATARACT EXTRACTION W/PHACO Left 03/08/2018   Procedure: CATARACT EXTRACTION PHACO AND INTRAOCULAR LENS PLACEMENT (IOC);  Surgeon: Birder Robson, MD;  Location: [facility] ORS;  Service: Ophthalmology;  Laterality: Left;  [place] 00:55 AP% 13.5 CDE 7.52 Fluid pack lot # [id number]  . CATARACT EXTRACTION W/PHACO Right 04/05/2018   Procedure: CATARACT EXTRACTION PHACO AND INTRAOCULAR LENS PLACEMENT (IOC);  Surgeon: Birder Robson, MD;  Location: [facility] ORS;  Service: Ophthalmology;  Laterality: Right;  [place] 00:36 AP% 15.9 CDE 5.72 Fluid pack lot # [id number]  . CEREBRAL ANEURYSM REPAIR    . CHOLECYSTECTOMY    . COLONOSCOPY WITH PROPOFOL N/A 04/26/2019   Procedure: COLONOSCOPY WITH PROPOFOL;  Surgeon: Jonathon Bellows, MD;  Location: [facility] ENDOSCOPY;  Service: Gastroenterology;  Laterality: N/A;  . PORTACATH PLACEMENT Left 08/05/2020   Procedure:  INSERTION PORT-A-CATH;  Surgeon: Herbert Pun, MD;  Location: [facility] ORS;  Service: General;  Laterality: Left;  . TUBAL LIGATION      SOCIAL HISTORY: Social History   Socioeconomic History  . Marital status: Divorced    Spouse name: Not on file  . Number of children: Not on file  . Years of education: 66  . Highest education level: Bachelor's degree (e.g., BA, AB, BS)  Occupational History  . Occupation: farmer  Tobacco Use  . Smoking status: Current Every Day Smoker    Packs/day: 0.25    Years: 50.00    Pack years: 12.50    Types: Cigarettes  . Smokeless tobacco: Never Used   Vaping Use  . Vaping Use: Never used  Substance and Sexual Activity  . Alcohol use: No  . Drug use: No  . Sexual activity: Not Currently  Other Topics Concern  . Not on file  Social History Narrative   Pt lives at [facility].    Social Determinants of Health   Financial Resource Strain: Not on file  Food Insecurity: Not on file  Transportation Needs: Not on file  Physical Activity: Not on file  Stress: Not on file  Social Connections: Not on file  Intimate Partner Violence: Not on file    FAMILY HISTORY: Family History  Problem Relation Age of Onset  . Hypertension Mother   . Heart failure Mother   . Hypertension Brother   . Stroke Brother   . Hypertension Son   . Emphysema Maternal Aunt   . Hypertension Paternal Aunt   . Hypertension Paternal Uncle   . Hypertension Maternal Grandmother   . Hypertension Paternal Grandmother     ALLERGIES:  is allergic to aspirin, belladonna alkaloids, fluoxetine hcl, naproxen, phenobarbital, and prozac [fluoxetine hcl].  MEDICATIONS:  Current Outpatient Medications  Medication Sig Dispense Refill  . acyclovir (ZOVIRAX) 400 MG tablet Take 1 tablet (400 mg total) by mouth 2 (two) times daily. 60 tablet 0  . amiodarone (PACERONE) 200 MG tablet Take 1 tablet (200 mg total) by mouth daily. 90 tablet 1  . apixaban (ELIQUIS) 5 MG TABS tablet Take 1 tablet (5 mg total) by mouth 2 (two) times daily. 60 tablet 0  . chlorhexidine (PERIDEX) 0.12 % solution Use as directed 10 mLs in the mouth or throat 2 (two) times daily. Swish and spit 473 mL 1  . dexamethasone (DECADRON) 4 MG tablet Take 1 tablet (4 mg total) by mouth daily. 7 tablet 0  . docusate sodium (COLACE) 100 MG capsule Take 1 capsule (100 mg total) by mouth daily. 30 capsule 1  . feeding supplement, ENSURE ENLIVE, (ENSURE ENLIVE) LIQD Take 237 mLs by mouth 3 (three) times daily between meals. 237 mL 12  . fentaNYL (DURAGESIC) 25 MCG/HR Place 1 patch onto the skin every 3  (three) days. 7 patch 0  . furosemide (LASIX) 20 MG tablet Take 1 tablet (20 mg total) by mouth daily as needed (for lower extremity swelling). 90 tablet 1  . gabapentin (NEURONTIN) 300 MG capsule Take 1 capsule (300 mg total) by mouth 3 (three) times daily. 90 capsule 0  . lidocaine-prilocaine (EMLA) cream Apply to affected area once (Patient taking differently: Apply 1 application topically as needed [facility]). Apply to affected area once) 30 g 3  . lisinopril (ZESTRIL) 20 MG tablet Take 1 tablet (20 mg total) by mouth daily.    . magnesium chloride (SLOW-MAG) 64 MG TBEC SR tablet Take 1 tablet (64 mg total)  by mouth 2 (two) times daily. 60 tablet 1  . Melatonin 10 MG TABS Take 10 mg by mouth at bedtime as needed (sleep).     . Multiple Vitamins-Minerals (CENTRUM SILVER PO) Take 1 tablet by mouth daily. Gummie    . nicotine (NICODERM CQ - DOSED IN MG/24 HOURS) 21 mg/24hr patch Place 1 patch (21 mg total) onto the skin daily. 28 patch 0  . ondansetron (ZOFRAN-ODT) 8 MG disintegrating tablet Take 1 tablet (8 mg total) by mouth every 8 (eight) hours as needed for nausea or vomiting. 90 tablet 0  . oxyCODONE-acetaminophen (PERCOCET) 5-325 MG tablet Take 2 tablets by mouth every 12 (twelve) hours as needed for severe pain. 60 tablet 0  . polyethylene glycol powder (GLYCOLAX/MIRALAX) 17 GM/SCOOP powder Take 17 g by mouth daily as needed for mild constipation or moderate constipation. 255 g 0  . potassium chloride 20 MEQ/15ML (10%) SOLN Take 30 mLs (40 mEq total) by mouth daily. [employer] to pay for medication 900 mL 0  . prochlorperazine (COMPAZINE) 10 MG tablet Take 1 tablet (10 mg total) by mouth every 6 (six) hours as needed (Nausea or vomiting). 30 tablet 1   No current facility-administered medications for this visit.   Facility-Administered Medications Ordered in Other Visits  Medication Dose Route Frequency Provider Last Rate Last Admin  . sodium chloride flush (NS) 0.9 % injection 10 mL  10 mL  Intravenous PRN Earlie Server, MD   10 mL at 10/10/20 1040     PHYSICAL EXAMINATION: ECOG PERFORMANCE STATUS: 1 - Symptomatic but completely ambulatory Vitals:   10/14/20 1309  BP: 102/68  Pulse: 96  Resp: 18  Temp: 98.4 F (36.9 C)   Filed Weights   10/14/20 1309  Weight: 105 lb 11.2 oz (47.9 kg)    Physical Exam Constitutional:      General: She is not in acute distress.    Comments: Thin built.  HENT:     Head: Normocephalic and atraumatic.     Mouth/Throat:     Comments: No thrush.  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 Cardiovascular:     Rate and Rhythm: Normal rate and regular rhythm.     Heart sounds: Normal heart sounds.  Pulmonary:     Effort: Pulmonary effort is normal. No respiratory distress.     Breath sounds: No wheezing.  Abdominal:     General: Bowel sounds are normal. There is no distension.     Palpations: Abdomen is soft.  Musculoskeletal:        General: No deformity. Normal range of motion.     Cervical back: Normal range of motion and neck supple.  Skin:    General: Skin is warm and dry.     Findings: No erythema or rash.  Neurological:     Mental Status: She is alert and oriented to person, place, and time. Mental status is at baseline.     Cranial Nerves: No cranial nerve deficit.     Coordination: Coordination normal.  Psychiatric:        Mood and Affect: Mood normal.     LABORATORY DATA:  I have reviewed the data as listed Lab Results  Component Value Date   WBC 3.4 (L) 10/14/2020   HGB 11.2 (L) 10/14/2020   HCT 32.0 (L) 10/14/2020   MCV 98.5 10/14/2020   PLT 348 10/14/2020   Recent  Labs    07/10/20 1311 07/12/20 1204 08/08/20 0821 10/07/20 0807 10/10/20 1039 10/14/20 1229  NA 136 137   < > 134* 136 133*  K 2.9* 4.0   < > 3.1* 3.5 2.9*  CL 97* 98   < > 95* 98 93*  CO2 28 27   < > 27 27 29   GLUCOSE 100* 114*   < > 84 102*  113*  BUN 13 8   < > 9 12 13   CREATININE 0.43* 0.42*   < > 0.56 0.45 0.54  CALCIUM 8.9 9.0   < > 8.6* 8.6* 8.8*  GFRNONAA >60 >60   < > >60 >60 >60  GFRAA >60 >60  --   --   --   --   PROT 7.0  --    < > 6.8 6.4* 6.7  ALBUMIN 3.7  --    < > 3.9 3.7 3.9  AST 20  --    < > 19 19 23   ALT 16  --    < > 15 12 15   ALKPHOS 106  --    < > 73 74 71  BILITOT 0.5  --    < > 0.9 0.6 0.8   < > = values in this interval not displayed.   Iron/TIBC/Ferritin/ %Sat    Component Value Date/Time   IRON 46 09/09/2020 0938   IRON 37 05/03/2018 1826   TIBC 210 (L) 09/09/2020 0938   FERRITIN 136 09/09/2020 0938   FERRITIN 52 05/03/2018 1826   IRONPCTSAT 22 09/09/2020 0938      RADIOGRAPHIC STUDIES: I have personally reviewed the radiological images as listed and agreed with the findings in the report. No results found.    ASSESSMENT & PLAN:  1. Encounter for antineoplastic chemotherapy   2. Hypokalemia   3. Hypomagnesemia   4. History of atrial fibrillation   5. Weight loss   6. Dehydration   7. 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  Status post concurrent chemotherapy and radiation.  Chemotherapy-induced mucositis.  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 Recommend patient to utilize Magic mouthwash as needed. Patient will get dexamethasone 10 mg x 1.   # severe hypokalemia, plan IVF 1 L normal saline with 21meq KCl today.  Continue oral potassium to 25meq daily.  I switched her to potassium chloride elixir  # Hypomagnesia, continue oral magnesium.  Plan IV magnesium sulfate 4 g x 1 today. #Poor oral intake, dysphagia, weight loss IV fluid hydration. She declines PET tube.   We will schedule patient for another IV hydration/later this week. Continue nutrition supplementation.  #Atrial fibrillation.  Continue amiodarone and metoprolol.  And Eliquis   ##neoplasm induced pain, continue fentanyl patch 37mcg/h Q72 hours. .   Continue garbapentine 300mg  TID.  2 Percocet tablets patient reports that she uses occasionally for pain.  Reviewed on 10/07/2020  All questions were answered. The patient knows to call the clinic with any problems questions or concerns.   Return of visit: 1 week to see NP, 2 weeks follow-up with me.  Earlie Server, MD, PhD Hematology Oncology [facility] at [facility] Pager- [phone] 10/14/2020

## 2020-10-14 NOTE — Progress Notes (Signed)
Pt here for follow up. After last radiation on 12/10 she has been having trouble swallowing, increased mucous and nausea. Also reports that her appetite is non existent and has lost 5 pounds since last week.

## 2020-10-14 NOTE — Telephone Encounter (Signed)
Pharmacy called reporting an interaction with patient Zofran and amiodarone something with QT prolongation and asking if you want to proceed with prescription or not. Please advise

## 2020-10-15 ENCOUNTER — Other Ambulatory Visit: Payer: Self-pay

## 2020-10-15 MED ORDER — DEXAMETHASONE 4 MG PO TABS: 4.0000 mg | ORAL_TABLET | Freq: Two times a day (BID) | ORAL | 0 refills | Status: DC

## 2020-10-15 NOTE — Telephone Encounter (Signed)
Pharmacy informed per VERBAL ORDER Dr Tasia Catchings not to fill Zofran, Prochlorperazine and that she will be submitting a new prescription for Dexamethasone. Shannon Schroeder repeated back to me

## 2020-10-18 ENCOUNTER — Inpatient Hospital Stay: Payer: Medicare Other

## 2020-10-18 ENCOUNTER — Other Ambulatory Visit: Payer: Self-pay

## 2020-10-18 DIAGNOSIS — Z5111 Encounter for antineoplastic chemotherapy: Secondary | ICD-10-CM | POA: Diagnosis not present

## 2020-10-18 DIAGNOSIS — C760 Malignant neoplasm of head, face and neck: Secondary | ICD-10-CM

## 2020-10-18 DIAGNOSIS — E876 Hypokalemia: Secondary | ICD-10-CM

## 2020-10-18 DIAGNOSIS — E871 Hypo-osmolality and hyponatremia: Secondary | ICD-10-CM

## 2020-10-18 LAB — CBC WITH DIFFERENTIAL/PLATELET
Abs Immature Granulocytes: 0.02 10*3/uL (ref 0.00–0.07)
Basophils Absolute: 0.1 10*3/uL (ref 0.0–0.1)
Basophils Relative: 2 %
Eosinophils Absolute: 0 10*3/uL (ref 0.0–0.5)
Eosinophils Relative: 0 %
HCT: 30 % — ABNORMAL LOW (ref 36.0–46.0)
Hemoglobin: 10.4 g/dL — ABNORMAL LOW (ref 12.0–15.0)
Immature Granulocytes: 1 %
Lymphocytes Relative: 10 %
Lymphs Abs: 0.3 10*3/uL — ABNORMAL LOW (ref 0.7–4.0)
MCH: 34.4 pg — ABNORMAL HIGH (ref 26.0–34.0)
MCHC: 34.7 g/dL (ref 30.0–36.0)
MCV: 99.3 fL (ref 80.0–100.0)
Monocytes Absolute: 0.6 10*3/uL (ref 0.1–1.0)
Monocytes Relative: 17 %
Neutro Abs: 2.3 10*3/uL (ref 1.7–7.7)
Neutrophils Relative %: 70 %
Platelets: 308 10*3/uL (ref 150–400)
RBC: 3.02 MIL/uL — ABNORMAL LOW (ref 3.87–5.11)
RDW: 18.1 % — ABNORMAL HIGH (ref 11.5–15.5)
WBC: 3.3 10*3/uL — ABNORMAL LOW (ref 4.0–10.5)
nRBC: 0 % (ref 0.0–0.2)

## 2020-10-18 LAB — COMPREHENSIVE METABOLIC PANEL
ALT: 10 U/L (ref 0–44)
AST: 17 U/L (ref 15–41)
Albumin: 3.5 g/dL (ref 3.5–5.0)
Alkaline Phosphatase: 73 U/L (ref 38–126)
Anion gap: 11 (ref 5–15)
BUN: 10 mg/dL (ref 8–23)
CO2: 27 mmol/L (ref 22–32)
Calcium: 8.6 mg/dL — ABNORMAL LOW (ref 8.9–10.3)
Chloride: 97 mmol/L — ABNORMAL LOW (ref 98–111)
Creatinine, Ser: 0.43 mg/dL — ABNORMAL LOW (ref 0.44–1.00)
GFR, Estimated: >60 mL/min (ref >60)
Glucose, Bld: 106 mg/dL — ABNORMAL HIGH (ref 70–99)
Potassium: 3 mmol/L — ABNORMAL LOW (ref 3.5–5.1)
Sodium: 135 mmol/L (ref 135–145)
Total Bilirubin: 0.5 mg/dL (ref 0.3–1.2)
Total Protein: 6.2 g/dL — ABNORMAL LOW (ref 6.5–8.1)

## 2020-10-18 LAB — MAGNESIUM: Magnesium: 1.4 mg/dL — ABNORMAL LOW (ref 1.7–2.4)

## 2020-10-18 MED ORDER — SODIUM CHLORIDE 0.9 % IV SOLN
Freq: Once | INTRAVENOUS | Status: AC
  Filled 2020-10-18: qty 20

## 2020-10-18 MED ORDER — SODIUM CHLORIDE 0.9 % IV SOLN
Freq: Once | INTRAVENOUS | Status: AC
  Filled 2020-10-18: qty 250

## 2020-10-18 MED ORDER — HEPARIN SOD (PORK) LOCK FLUSH 100 UNIT/ML IV SOLN
500.0000 [IU] | Freq: Once | INTRAVENOUS | Status: AC | PRN
  Administered 2020-10-18: 14:00:00 500 [IU]
  Filled 2020-10-18: qty 5

## 2020-10-18 NOTE — Progress Notes (Signed)
Pt received IVF's with electrolytes. No complaints at time of discharge. VSS.

## 2020-10-22 ENCOUNTER — Inpatient Hospital Stay: Payer: Medicare Other

## 2020-10-22 ENCOUNTER — Inpatient Hospital Stay (HOSPITAL_BASED_OUTPATIENT_CLINIC_OR_DEPARTMENT_OTHER): Payer: Medicare Other | Admitting: Oncology

## 2020-10-22 ENCOUNTER — Encounter: Payer: Self-pay | Admitting: Oncology

## 2020-10-22 ENCOUNTER — Other Ambulatory Visit: Payer: Self-pay

## 2020-10-22 VITALS — BP 138/82 | HR 60 | Temp 96.5°F | Resp 18

## 2020-10-22 DIAGNOSIS — G893 Neoplasm related pain (acute) (chronic): Secondary | ICD-10-CM | POA: Diagnosis not present

## 2020-10-22 DIAGNOSIS — E871 Hypo-osmolality and hyponatremia: Secondary | ICD-10-CM

## 2020-10-22 DIAGNOSIS — E876 Hypokalemia: Secondary | ICD-10-CM

## 2020-10-22 DIAGNOSIS — C760 Malignant neoplasm of head, face and neck: Secondary | ICD-10-CM

## 2020-10-22 DIAGNOSIS — Z5111 Encounter for antineoplastic chemotherapy: Secondary | ICD-10-CM | POA: Diagnosis not present

## 2020-10-22 DIAGNOSIS — E860 Dehydration: Secondary | ICD-10-CM

## 2020-10-22 LAB — COMPREHENSIVE METABOLIC PANEL
ALT: 11 U/L (ref 0–44)
AST: 17 U/L (ref 15–41)
Albumin: 3.4 g/dL — ABNORMAL LOW (ref 3.5–5.0)
Alkaline Phosphatase: 71 U/L (ref 38–126)
Anion gap: 11 (ref 5–15)
BUN: 12 mg/dL (ref 8–23)
CO2: 27 mmol/L (ref 22–32)
Calcium: 8.5 mg/dL — ABNORMAL LOW (ref 8.9–10.3)
Chloride: 95 mmol/L — ABNORMAL LOW (ref 98–111)
Creatinine, Ser: 0.48 mg/dL (ref 0.44–1.00)
GFR, Estimated: >60 mL/min (ref >60)
Glucose, Bld: 94 mg/dL (ref 70–99)
Potassium: 2.9 mmol/L — ABNORMAL LOW (ref 3.5–5.1)
Sodium: 133 mmol/L — ABNORMAL LOW (ref 135–145)
Total Bilirubin: 0.6 mg/dL (ref 0.3–1.2)
Total Protein: 6 g/dL — ABNORMAL LOW (ref 6.5–8.1)

## 2020-10-22 LAB — CBC WITH DIFFERENTIAL/PLATELET
Abs Immature Granulocytes: 0 10*3/uL (ref 0.00–0.07)
Basophils Absolute: 0 10*3/uL (ref 0.0–0.1)
Basophils Relative: 1 %
Eosinophils Absolute: 0 10*3/uL (ref 0.0–0.5)
Eosinophils Relative: 0 %
HCT: 29.1 % — ABNORMAL LOW (ref 36.0–46.0)
Hemoglobin: 10.1 g/dL — ABNORMAL LOW (ref 12.0–15.0)
Immature Granulocytes: 0 %
Lymphocytes Relative: 12 %
Lymphs Abs: 0.3 10*3/uL — ABNORMAL LOW (ref 0.7–4.0)
MCH: 34.6 pg — ABNORMAL HIGH (ref 26.0–34.0)
MCHC: 34.7 g/dL (ref 30.0–36.0)
MCV: 99.7 fL (ref 80.0–100.0)
Monocytes Absolute: 0.6 10*3/uL (ref 0.1–1.0)
Monocytes Relative: 21 %
Neutro Abs: 1.8 10*3/uL (ref 1.7–7.7)
Neutrophils Relative %: 66 %
Platelets: 269 10*3/uL (ref 150–400)
RBC: 2.92 MIL/uL — ABNORMAL LOW (ref 3.87–5.11)
RDW: 18.1 % — ABNORMAL HIGH (ref 11.5–15.5)
WBC: 2.7 10*3/uL — ABNORMAL LOW (ref 4.0–10.5)
nRBC: 0 % (ref 0.0–0.2)

## 2020-10-22 LAB — MAGNESIUM: Magnesium: 1.5 mg/dL — ABNORMAL LOW (ref 1.7–2.4)

## 2020-10-22 MED ORDER — HEPARIN SOD (PORK) LOCK FLUSH 100 UNIT/ML IV SOLN
500.0000 [IU] | Freq: Once | INTRAVENOUS | Status: AC | PRN
  Administered 2020-10-22: 13:00:00 500 [IU]
  Filled 2020-10-22: qty 5

## 2020-10-22 MED ORDER — FENTANYL 25 MCG/HR TD PT72: 1.0000 | MEDICATED_PATCH | TRANSDERMAL | 0 refills | Status: DC

## 2020-10-22 MED ORDER — SODIUM CHLORIDE 0.9% FLUSH
10.0000 mL | INTRAVENOUS | Status: DC | PRN
  Administered 2020-10-22: 10:00:00 10 mL via INTRAVENOUS
  Filled 2020-10-22: qty 10

## 2020-10-22 MED ORDER — SODIUM CHLORIDE 0.9 % IV SOLN
Freq: Once | INTRAVENOUS | Status: AC
  Filled 2020-10-22: qty 20

## 2020-10-22 MED ORDER — HEPARIN SOD (PORK) LOCK FLUSH 100 UNIT/ML IV SOLN
500.0000 [IU] | Freq: Once | INTRAVENOUS | Status: DC
  Filled 2020-10-22: qty 5

## 2020-10-22 NOTE — Progress Notes (Signed)
Patient here for oncology [facility] appointment, expresses concerns of tongue and neck discomfort.

## 2020-10-22 NOTE — Progress Notes (Signed)
Pt tolerated IVF with electrolytes. No complaints at time of discharge. Ambulatory. VSS.

## 2020-10-22 NOTE — Progress Notes (Signed)
Hematology/Oncology follow up note [facility] [phone] [phone]   Patient Care Team: Pcp, No as PCP - General Rockey Situ, Kathlene November, MD as PCP - Cardiology (Cardiology) Earlie Server, MD as Consulting Physician (Oncology)  CHIEF COMPLAINTS/REASON FOR VISIT:  Follow-up for head and neck cancer  HISTORY OF PRESENTING ILLNESS:   Shannon Schroeder is a  64 y.o.  female with PMH listed below was seen in consultation at the request of ER Dr. [name] for evaluation of abnormal CT scan.  07/10/2020 she presented emergency room for evaluation of right-sided facial swelling and pain in the right side of her mouth for the past 6 weeks.  It started in her mouth with a small ulcer which progressively got worse.  She wears denture.  Not able to chew well.  She eats soft food. She was found to have right upper gum also with yellowish discharge noted. 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  Avoid piloting.,  Right orbital apex, also the right soft palate and palate and tonsil.  Furthermore there is evidence of intracranial extension along the right V2 at the foramen rotundum Small suspicious right level 1A lymph node measuring 7 mm.  Separate small indeterminate but suspicious enhancing soft tissue nodule of the left sublingual space.  Patient was referred to oncology for further evaluation management. Patient denies fever, chills, nausea vomiting diarrhea shortness of breath or cough.  Reports 10 out of 10 severe pain of right face and numbness.  She also has headache. Reports unintentional weight loss  She reports no contactable family members.  She is in the process of pointing her roommate [name] to be her power of attorney.  She also wants to record today's conversation for her roommate  [name]. Patient has 50-pack-year smoking history.  # seen by ENT Dr.Juengel and had a biopsy. Biopsy was sent to [facility] and the final pathology was positive for invasive squamous cell carcinoma, well-differentiated, keratinizing. 07/17/2020, brain MRI showed large right facial mass with right maxillary erosion involving the maxillary sinus, hard palate and pterygoid plates and muscles.  Perineural spreading along the multiple branches of the maxillary division of the right trigeminal nerve at the infraorbital canal, pterygopalatine fossa and foramen rotundum. 07/17/2020, PET scan showed Intense FDG uptake is associated with the large enhancing tumor which involves the right maxilla, right nasal cavity, right pterygoid palatine fossa, right orbital apex and right soft tissue palate and palatine tonsil. 2. Mild nonspecific FDG uptake is associated with the recently characterized suspicious right level 1A lymph node which measures 7 mm and has an SUV max of 2.12.  No signs of distant metastasis.  Aortic atherosclerosis.  Patient also reports a history of hypertension, dysrhythmia, history of V. tach.  Patient previously was on lisinopril, atenolol, amlodipine for which she is no longer taking at this point due to lack of medication coverage.  She also does not have any primary care provider. 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  On average she says she is takes 2 to 3 pills/day.  She also takes gabapentin 300 mg 3 times daily  # new onset of atrial fibrillation with rapid ventricular  response.  Patient was started on metoprolol.  Eliquis for anticoagulation.  # 09/06/2020- 09/11/2020 admitted due to A. fib, hypotension, multifocal pneumonia.  Patient was treated with IV antibiotics.  Cardiology saw the patient during admission and  added amiodarone.  Patient was discharged on 5 days course of oral antibiotics, continued on metoprolol and amiodarone for atrial fibrillation.  # Dysphagia, she declined PET tube placment.  #08/08/2020-10/11/2020 concurrent cisplatin and radiation.  INTERVAL HISTORY  Shannon Schroeder is a 64 y.o. female who was recently diagnosed with head and neck cancer and is followed by Dr. Tasia Catchings.  She is status post 9 cycles of single agent cisplatin along with concurrent radiation.  Her last chemo treatment was on 10/07/2020 and she completed radiation on 10/11/2020.   She was last evaluated by Dr. Tasia Catchings on 10/14/2020 where she had complaints of chemo-induced mucositis, hypokalemia, hypomagnesemia and poor oral intake secondary to dysphagia.  She was started on acyclovir and Magic mouthwash and given electrolytes with IV fluids.  She presents back today to review labs and discuss additional IV fluids and electrolytes.  She reports feeling much better since completing radiation.  She still has dysphagia but she is able to swallow solid foods.  She had fried chicken last night.  She denies any recurrent nausea or vomiting.  She denies any fever, chills, diarrhea, cough or shortness of breath.  Review of Systems  Constitutional: Positive for fatigue. Negative for appetite change, fever and unexpected weight change.  HENT:   Positive for trouble swallowing (Improving). Negative for nosebleeds and sore throat.   Eyes: Negative.   Respiratory: Negative.  Negative for cough, shortness of breath and wheezing.   Cardiovascular: Negative.  Negative for chest pain and leg swelling.  Gastrointestinal: Negative for abdominal pain, blood in stool, constipation, diarrhea, nausea and vomiting.  Endocrine: Negative.   Genitourinary: Negative.  Negative for bladder incontinence, hematuria and nocturia.   Musculoskeletal: Negative.  Negative for back pain and flank pain.  Skin: Negative.   Neurological: Negative.  Negative  for dizziness, headaches, light-headedness and numbness.  Hematological: Negative.   Psychiatric/Behavioral: Negative.  Negative for confusion. The patient is not nervous/anxious.     MEDICAL HISTORY:  Past Medical History:  Diagnosis Date  . 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   Marland Kitchen Anxiety   . Arthritis   . Bipolar disorder ([place])   . COPD (chronic obstructive pulmonary disease) ([place])    NO INHALERS  . Depression   . Dysrhythmia    H/O V TACH  . Head and neck cancer ([place]) 07/29/2020  . Headache    H/O MIGRAINES  . Hypertension   . Seizures ([place])    X1 AFTER FALL    SURGICAL HISTORY: Past Surgical History:  Procedure Laterality Date  . ABDOMINAL HYSTERECTOMY     partial  . BREAST SURGERY     biopsy  . CARDIAC CATHETERIZATION     X 2  . CARPAL TUNNEL RELEASE Right   . CARPECTOMY HAND Right   . CATARACT EXTRACTION W/PHACO Left 03/08/2018   Procedure: CATARACT EXTRACTION PHACO AND INTRAOCULAR LENS PLACEMENT (IOC);  Surgeon: Birder Robson, MD;  Location: [facility] ORS;  Service: Ophthalmology;  Laterality: Left;  [place] 00:55 AP% 13.5 CDE 7.52 Fluid pack lot # [id number]  . CATARACT EXTRACTION W/PHACO Right 04/05/2018   Procedure: CATARACT EXTRACTION PHACO AND INTRAOCULAR LENS PLACEMENT (IOC);  Surgeon: Birder Robson, MD;  Location: [facility] ORS;  Service: Ophthalmology;  Laterality: Right;  [place] 00:36 AP% 15.9 CDE 5.72 Fluid pack lot # [id number]  . CEREBRAL ANEURYSM REPAIR    . CHOLECYSTECTOMY    . COLONOSCOPY WITH PROPOFOL N/A 04/26/2019   Procedure: COLONOSCOPY WITH PROPOFOL;  Surgeon: Jonathon Bellows, MD;  Location: [facility] ENDOSCOPY;  Service: Gastroenterology;  Laterality: N/A;  . PORTACATH PLACEMENT Left 08/05/2020   Procedure: INSERTION PORT-A-CATH;  Surgeon: Herbert Pun, MD;  Location: [facility] ORS;  Service: General;  Laterality: Left;  . TUBAL LIGATION      SOCIAL HISTORY: Social History   Socioeconomic History  .  Marital status: Divorced    Spouse name: Not on file  . Number of children: Not on file  . Years of education: 46  . Highest education level: Bachelor's degree (e.g., BA, AB, BS)  Occupational History  . Occupation: farmer  Tobacco Use  . Smoking status: Current Every Day Smoker    Packs/day: 0.25    Years: 50.00    Pack years: 12.50    Types: Cigarettes  . Smokeless tobacco: Never Used  Vaping Use  . Vaping Use: Never used  Substance and Sexual Activity  . Alcohol use: No  . Drug use: No  . Sexual activity: Not Currently  Other Topics Concern  . Not on file  Social History Narrative   Pt lives at [facility] [place].    Social Determinants of Health   Financial Resource Strain: Not on file  Food Insecurity: Not on file  Transportation Needs: Not on file  Physical Activity: Not on file  Stress: Not on file  Social Connections: Not on file  Intimate Partner Violence: Not on file    FAMILY HISTORY: Family History  Problem Relation Age of Onset  . Hypertension Mother   . Heart failure Mother   . Hypertension Brother   . Stroke Brother   . Hypertension Son   . Emphysema Maternal Aunt   . Hypertension Paternal Aunt   . Hypertension Paternal Uncle   . Hypertension Maternal Grandmother   . Hypertension Paternal Grandmother     ALLERGIES:  is allergic to aspirin, belladonna alkaloids, fluoxetine hcl, naproxen, phenobarbital, and prozac [fluoxetine hcl].  MEDICATIONS:  Current Outpatient Medications  Medication Sig Dispense Refill  . acyclovir (ZOVIRAX) 400 MG tablet Take 1 tablet (400 mg total) by mouth 2 (two) times daily. 60 tablet 0  . amiodarone (PACERONE) 200 MG tablet Take 1 tablet (200 mg total) by mouth daily. 90 tablet 1  . apixaban (ELIQUIS) 5 MG TABS tablet Take 1 tablet (5 mg total) by mouth 2 (two) times daily. 60 tablet 0  . chlorhexidine (PERIDEX) 0.12 % solution Use as directed 10 mLs in the mouth or throat 2 (two) times daily. Swish and spit 473 mL 1   . docusate sodium (COLACE) 100 MG capsule Take 1 capsule (100 mg total) by mouth daily. 30 capsule 1  . feeding supplement, ENSURE ENLIVE, (ENSURE ENLIVE) LIQD Take 237 mLs by mouth 3 (three) times daily between meals. 237 mL 12  . fentaNYL (DURAGESIC) 25 MCG/HR Place 1 patch onto the skin every 3 (three) days. 7 patch 0  . furosemide (LASIX) 20 MG tablet Take 1 tablet (20 mg total) by mouth daily as needed (for lower extremity swelling). 90 tablet 1  . gabapentin (NEURONTIN) 300 MG capsule Take 1 capsule (300 mg total) by mouth 3 (three) times daily. 90 capsule 0  . lidocaine-prilocaine (EMLA) cream Apply to affected area once (Patient taking differently: Apply 1 application topically as  needed [facility]). Apply to affected area once) 30 g 3  . lisinopril (ZESTRIL) 20 MG tablet Take 1 tablet (20 mg total) by mouth daily.    . magnesium chloride (SLOW-MAG) 64 MG TBEC SR tablet Take 1 tablet (64 mg total) by mouth 2 (two) times daily. 60 tablet 1  . ondansetron (ZOFRAN-ODT) 8 MG disintegrating tablet Take 1 tablet (8 mg total) by mouth every 8 (eight) hours as needed for nausea or vomiting. 90 tablet 0  . oxyCODONE-acetaminophen (PERCOCET) 5-325 MG tablet Take 2 tablets by mouth every 12 (twelve) hours as needed for severe pain. 60 tablet 0  . polyethylene glycol powder (GLYCOLAX/MIRALAX) 17 GM/SCOOP powder Take 17 g by mouth daily as needed for mild constipation or moderate constipation. 255 g 0  . potassium chloride 20 MEQ/15ML (10%) SOLN Take 30 mLs (40 mEq total) by mouth daily. [employer] to pay for medication 900 mL 0  . prochlorperazine (COMPAZINE) 10 MG tablet Take 1 tablet (10 mg total) by mouth every 6 (six) hours as needed (Nausea or vomiting). 30 tablet 1  . dexamethasone (DECADRON) 4 MG tablet Take 1 tablet (4 mg total) by mouth 2 (two) times daily. For 3 days (Patient not taking: Reported on 10/22/2020) 6 tablet 0  . Melatonin 10 MG TABS Take 10 mg by mouth at bedtime as needed (sleep).   (Patient not taking: Reported on 10/22/2020)    . Multiple Vitamins-Minerals (CENTRUM SILVER PO) Take 1 tablet by mouth daily. Gummie (Patient not taking: Reported on 10/22/2020)    . nicotine (NICODERM CQ - DOSED IN MG/24 HOURS) 21 mg/24hr patch Place 1 patch (21 mg total) onto the skin daily. (Patient not taking: Reported on 10/22/2020) 28 patch 0   No current facility-administered medications for this visit.   Facility-Administered Medications Ordered in Other Visits  Medication Dose Route Frequency Provider Last Rate Last Admin  . sodium chloride 0.9 % 1,000 mL with potassium chloride 40 mEq, magnesium sulfate 2 g infusion   Intravenous Once Faythe Casa E, NP      . sodium chloride flush (NS) 0.9 % injection 10 mL  10 mL Intravenous PRN Earlie Server, MD   10 mL at 10/10/20 1040     PHYSICAL EXAMINATION: ECOG PERFORMANCE STATUS: 1 - Symptomatic but completely ambulatory Vitals:   10/22/20 0940  BP: 138/82  Pulse: 60  Resp: 18  Temp: (!) 96.5 F (35.8 C)  SpO2: 99%   There were no vitals filed for this visit.  Physical Exam Constitutional:      General: Vital signs are normal.     Appearance: Normal appearance.  HENT:     Head: Normocephalic and atraumatic.  Eyes:     Pupils: Pupils are equal, round, and reactive to light.  Cardiovascular:     Rate and Rhythm: Normal rate and regular rhythm.     Heart sounds: Normal heart sounds. No murmur heard.   Pulmonary:     Effort: Pulmonary effort is normal.     Breath sounds: Normal breath sounds. No wheezing.  Abdominal:     General: Bowel sounds are normal. There is no distension.     Palpations: Abdomen is soft.     Tenderness: There is no abdominal tenderness.  Musculoskeletal:        General: No edema. Normal range of motion.     Cervical back: Normal range of motion.  Skin:    General: Skin is warm and dry.     Findings: No rash.  Neurological:     Mental Status: She is alert and oriented to person, place, and  time.  Psychiatric:        Judgment: Judgment normal.     LABORATORY DATA:  I have reviewed the data as listed Lab Results  Component Value Date   WBC 2.7 (L) 10/22/2020   HGB 10.1 (L) 10/22/2020   HCT 29.1 (L) 10/22/2020   MCV 99.7 10/22/2020   PLT 269 10/22/2020   Recent Labs    07/10/20 1311 07/12/20 1204 08/08/20 0821 10/14/20 1229 10/18/20 1023 10/22/20 0933  NA 136 137   < > 133* 135 133*  K 2.9* 4.0   < > 2.9* 3.0* 2.9*  CL 97* 98   < > 93* 97* 95*  CO2 28 27   < > 29 27 27   GLUCOSE 100* 114*   < > 113* 106* 94  BUN 13 8   < > 13 10 12   CREATININE 0.43* 0.42*   < > 0.54 0.43* 0.48  CALCIUM 8.9 9.0   < > 8.8* 8.6* 8.5*  GFRNONAA >60 >60   < > >60 >60 >60  GFRAA >60 >60  --   --   --   --   PROT 7.0  --    < > 6.7 6.2* 6.0*  ALBUMIN 3.7  --    < > 3.9 3.5 3.4*  AST 20  --    < > 23 17 17   ALT 16  --    < > 15 10 11   ALKPHOS 106  --    < > 71 73 71  BILITOT 0.5  --    < > 0.8 0.5 0.6   < > = values in this interval not displayed.   Iron/TIBC/Ferritin/ %Sat    Component Value Date/Time   IRON 46 09/09/2020 0938   IRON 37 05/03/2018 1826   TIBC 210 (L) 09/09/2020 0938   FERRITIN 136 09/09/2020 0938   FERRITIN 52 05/03/2018 1826   IRONPCTSAT 22 09/09/2020 0938      RADIOGRAPHIC STUDIES: I have personally reviewed the radiological images as listed and agreed with the findings in the report. No results found.    ASSESSMENT & PLAN:  1. Dehydration   2. Hypomagnesemia   3. Hypokalemia   4. Neoplasm related pain     1. Maxillary sinus squamous cell carcinoma; cT4b cN1 M0.  -Status post concurrent chemotherapy and radiation. -Last chemo with cisplatin was on 10/07/2020. -She completed radiation on 10/11/2020  2. Chemotherapy-induced mucositis.   -Continue acyclovir 400 mg twice daily for HSV prophylaxis. -Recommend patient to utilize Magic mouthwash as needed. -Much improvement noted today.  3. Severe hypokalemia: -Potassium level today is  2.9. -Plan to give 40 mEq KCl today -Continue oral potassium  4. Hypomagnesia.  -Magnesium level 1.5 today -Continue oral magnesium.   -Plan IV magnesium sulfate 2 g   5. Poor oral intake/dysphagia -Weight loss-this appears stable -1 L normal saline today. -She declines PEG tube.   -She is drinking supplements as able  6.  Atrial fibrillation: -Continue amiodarone and metoprolol. -Continue Eliquis  7.  Neoplasm induced pain: -Stable on fentanyl patch 25 mcg/h every 72 hours -Continue gabapentin and Percocet as needed. -She is asking for a refill on her fentanyl patch.  Disposition: -IV fluids and electrolytes today. -RTC on Thursday for additional labs and possible fluids. -She has scheduled follow-up with Dr. Tasia Catchings next week  Greater than 50% was spent in counseling and coordination  of care with this patient including but not limited to discussion of the relevant topics above (See A&P) including, but not limited to diagnosis and management of acute and chronic medical conditions.   All questions were answered. The patient knows to call the clinic with any problems questions or concerns.  Faythe Casa, NP 10/22/2020 10:17 AM

## 2020-10-24 ENCOUNTER — Inpatient Hospital Stay: Payer: Medicare Other

## 2020-10-24 ENCOUNTER — Other Ambulatory Visit: Payer: Self-pay

## 2020-10-24 DIAGNOSIS — Z5111 Encounter for antineoplastic chemotherapy: Secondary | ICD-10-CM | POA: Diagnosis not present

## 2020-10-24 DIAGNOSIS — C760 Malignant neoplasm of head, face and neck: Secondary | ICD-10-CM

## 2020-10-24 DIAGNOSIS — E876 Hypokalemia: Secondary | ICD-10-CM

## 2020-10-24 LAB — COMPREHENSIVE METABOLIC PANEL
ALT: 10 U/L (ref 0–44)
AST: 16 U/L (ref 15–41)
Albumin: 3.2 g/dL — ABNORMAL LOW (ref 3.5–5.0)
Alkaline Phosphatase: 72 U/L (ref 38–126)
Anion gap: 9 (ref 5–15)
BUN: 7 mg/dL — ABNORMAL LOW (ref 8–23)
CO2: 27 mmol/L (ref 22–32)
Calcium: 8.4 mg/dL — ABNORMAL LOW (ref 8.9–10.3)
Chloride: 96 mmol/L — ABNORMAL LOW (ref 98–111)
Creatinine, Ser: 0.46 mg/dL (ref 0.44–1.00)
GFR, Estimated: >60 mL/min (ref >60)
Glucose, Bld: 79 mg/dL (ref 70–99)
Potassium: 3.1 mmol/L — ABNORMAL LOW (ref 3.5–5.1)
Sodium: 132 mmol/L — ABNORMAL LOW (ref 135–145)
Total Bilirubin: 0.9 mg/dL (ref 0.3–1.2)
Total Protein: 5.8 g/dL — ABNORMAL LOW (ref 6.5–8.1)

## 2020-10-24 LAB — MAGNESIUM: Magnesium: 1.6 mg/dL — ABNORMAL LOW (ref 1.7–2.4)

## 2020-10-24 MED ORDER — SODIUM CHLORIDE 0.9 % IV SOLN: Freq: Once | INTRAVENOUS | Status: DC

## 2020-10-24 MED ORDER — HEPARIN SOD (PORK) LOCK FLUSH 100 UNIT/ML IV SOLN
500.0000 [IU] | Freq: Once | INTRAVENOUS | Status: AC
  Administered 2020-10-24: 12:00:00 500 [IU] via INTRAVENOUS
  Filled 2020-10-24: qty 5

## 2020-10-24 MED ORDER — MAGNESIUM SULFATE 2 GM/50ML IV SOLN
2.0000 g | Freq: Once | INTRAVENOUS | Status: AC
  Administered 2020-10-24: 10:00:00 2 g via INTRAVENOUS
  Filled 2020-10-24: qty 50

## 2020-10-24 MED ORDER — POTASSIUM CHLORIDE IN NACL 20-0.9 MEQ/L-% IV SOLN
Freq: Once | INTRAVENOUS | Status: AC
  Filled 2020-10-24: qty 1000

## 2020-10-24 MED ORDER — SODIUM CHLORIDE 0.9 % IV SOLN
INTRAVENOUS | Status: DC
  Filled 2020-10-24: qty 250

## 2020-10-24 NOTE — Progress Notes (Signed)
Nutrition Follow-up:  Patient with squamous cell carcinoma of right maxilla infiltrates right buccal floor of nasal cavity, right paetergoid palatin foxxa, the right orbital apex and right soft palate and palatine tonsil.  Patient completed chemotherapy on 12/6 and radiation on 12/10.    Met with patient during fluids today.  Patient reports pain on swallowing and in mouth is getting better. Eating less puree foods and more finely chopped.  Drinking boost or ensure plus about 3 a day. Tries to drink between meals because they tend to fill her up.    Medications: reviewed  Labs: reviewed  Anthropometrics:   Weight 116 lb today per patient.  Not charted at this time of writing note.    Noted weight of 105 lb on 12/13   NUTRITION DIAGNOSIS: Inadequate oral intake improving   INTERVENTION:  Encouraged patient to continue to eat high calorie, high protein foods Complimentary case of ensure plus given to patient today.    MONITORING, EVALUATION, GOAL: weight trends, intake   NEXT VISIT: Jan 27 phone f/u  Shannon Schroeder B. Zenia Resides, [place], [place] Registered Dietitian [phone] (mobile)

## 2020-10-24 NOTE — Progress Notes (Signed)
Pt has a port KCL 20 can run over 1 hour per MD. Tolerated NS and electrolyte infusion. No complaints at time of discharge. Ambulatory.

## 2020-10-28 ENCOUNTER — Other Ambulatory Visit: Payer: Self-pay | Admitting: *Deleted

## 2020-10-28 DIAGNOSIS — E876 Hypokalemia: Secondary | ICD-10-CM

## 2020-10-28 DIAGNOSIS — C760 Malignant neoplasm of head, face and neck: Secondary | ICD-10-CM

## 2020-10-29 ENCOUNTER — Inpatient Hospital Stay (HOSPITAL_BASED_OUTPATIENT_CLINIC_OR_DEPARTMENT_OTHER): Payer: Medicare Other | Admitting: Oncology

## 2020-10-29 ENCOUNTER — Inpatient Hospital Stay: Payer: Medicare Other

## 2020-10-29 ENCOUNTER — Encounter: Payer: Self-pay | Admitting: Oncology

## 2020-10-29 VITALS — BP 117/61 | HR 57 | Temp 96.7°F | Resp 16

## 2020-10-29 VITALS — BP 151/82 | HR 64 | Temp 96.7°F | Resp 17 | Wt 115.0 lb

## 2020-10-29 DIAGNOSIS — E876 Hypokalemia: Secondary | ICD-10-CM

## 2020-10-29 DIAGNOSIS — G893 Neoplasm related pain (acute) (chronic): Secondary | ICD-10-CM

## 2020-10-29 DIAGNOSIS — Z5111 Encounter for antineoplastic chemotherapy: Secondary | ICD-10-CM | POA: Diagnosis not present

## 2020-10-29 DIAGNOSIS — C760 Malignant neoplasm of head, face and neck: Secondary | ICD-10-CM

## 2020-10-29 DIAGNOSIS — E871 Hypo-osmolality and hyponatremia: Secondary | ICD-10-CM

## 2020-10-29 DIAGNOSIS — Z8679 Personal history of other diseases of the circulatory system: Secondary | ICD-10-CM | POA: Diagnosis not present

## 2020-10-29 DIAGNOSIS — C310 Malignant neoplasm of maxillary sinus: Secondary | ICD-10-CM

## 2020-10-29 LAB — COMPREHENSIVE METABOLIC PANEL
ALT: 9 U/L (ref 0–44)
AST: 19 U/L (ref 15–41)
Albumin: 3.6 g/dL (ref 3.5–5.0)
Alkaline Phosphatase: 73 U/L (ref 38–126)
Anion gap: 8 (ref 5–15)
BUN: 7 mg/dL — ABNORMAL LOW (ref 8–23)
CO2: 28 mmol/L (ref 22–32)
Calcium: 8.8 mg/dL — ABNORMAL LOW (ref 8.9–10.3)
Chloride: 96 mmol/L — ABNORMAL LOW (ref 98–111)
Creatinine, Ser: 0.59 mg/dL (ref 0.44–1.00)
GFR, Estimated: >60 mL/min (ref >60)
Glucose, Bld: 94 mg/dL (ref 70–99)
Potassium: 3.4 mmol/L — ABNORMAL LOW (ref 3.5–5.1)
Sodium: 132 mmol/L — ABNORMAL LOW (ref 135–145)
Total Bilirubin: 0.6 mg/dL (ref 0.3–1.2)
Total Protein: 6.5 g/dL (ref 6.5–8.1)

## 2020-10-29 LAB — CBC WITH DIFFERENTIAL/PLATELET
Abs Immature Granulocytes: 0.02 10*3/uL (ref 0.00–0.07)
Basophils Absolute: 0 10*3/uL (ref 0.0–0.1)
Basophils Relative: 1 %
Eosinophils Absolute: 0 10*3/uL (ref 0.0–0.5)
Eosinophils Relative: 0 %
HCT: 29.8 % — ABNORMAL LOW (ref 36.0–46.0)
Hemoglobin: 10.2 g/dL — ABNORMAL LOW (ref 12.0–15.0)
Immature Granulocytes: 1 %
Lymphocytes Relative: 8 %
Lymphs Abs: 0.4 10*3/uL — ABNORMAL LOW (ref 0.7–4.0)
MCH: 34.7 pg — ABNORMAL HIGH (ref 26.0–34.0)
MCHC: 34.2 g/dL (ref 30.0–36.0)
MCV: 101.4 fL — ABNORMAL HIGH (ref 80.0–100.0)
Monocytes Absolute: 0.5 10*3/uL (ref 0.1–1.0)
Monocytes Relative: 12 %
Neutro Abs: 3.4 10*3/uL (ref 1.7–7.7)
Neutrophils Relative %: 78 %
Platelets: 225 10*3/uL (ref 150–400)
RBC: 2.94 MIL/uL — ABNORMAL LOW (ref 3.87–5.11)
RDW: 19 % — ABNORMAL HIGH (ref 11.5–15.5)
WBC: 4.3 10*3/uL (ref 4.0–10.5)
nRBC: 0 % (ref 0.0–0.2)

## 2020-10-29 LAB — MAGNESIUM: Magnesium: 1.6 mg/dL — ABNORMAL LOW (ref 1.7–2.4)

## 2020-10-29 MED ORDER — SODIUM CHLORIDE 0.9% FLUSH
10.0000 mL | Freq: Once | INTRAVENOUS | Status: AC | PRN
  Administered 2020-10-29: 11:00:00 10 mL
  Filled 2020-10-29: qty 10

## 2020-10-29 MED ORDER — HEPARIN SOD (PORK) LOCK FLUSH 100 UNIT/ML IV SOLN
500.0000 [IU] | Freq: Once | INTRAVENOUS | Status: AC | PRN
  Administered 2020-10-29: 13:00:00 500 [IU]
  Filled 2020-10-29: qty 5

## 2020-10-29 MED ORDER — MAGNESIUM SULFATE 2 GM/50ML IV SOLN
2.0000 g | Freq: Once | INTRAVENOUS | Status: AC
  Administered 2020-10-29: 12:00:00 2 g via INTRAVENOUS
  Filled 2020-10-29: qty 50

## 2020-10-29 MED ORDER — POTASSIUM CHLORIDE IN NACL 20-0.9 MEQ/L-% IV SOLN
INTRAVENOUS | Status: DC
  Filled 2020-10-29 (×2): qty 1000

## 2020-10-29 NOTE — Progress Notes (Signed)
Pt finally able to eat some actual food. She ate vienna sausages and chili over the weekend. Reports R sided face pain continues. Taking pain meds as ordered.

## 2020-10-29 NOTE — Progress Notes (Signed)
Py received 1 liter of NS with potassium and magnesium. Tolerated well. No complaints at discharge ambulatory.

## 2020-10-29 NOTE — Progress Notes (Signed)
Hematology/Oncology follow up note [facility] [phone] [phone]   Patient Care Team: Pcp, No as PCP - General Rockey Situ, Kathlene November, MD as PCP - Cardiology (Cardiology) Earlie Server, MD as Consulting Physician (Oncology)  CHIEF COMPLAINTS/REASON FOR VISIT:  Follow-up for head and neck cancer  HISTORY OF PRESENTING ILLNESS:   Shannon Schroeder is a  64 y.o.  female with PMH listed below was seen in consultation at the request of ER Dr. [name] for evaluation of abnormal CT scan.  07/10/2020 she presented emergency room for evaluation of right-sided facial swelling and pain in the right side of her mouth for the past 6 weeks.  It started in her mouth with a small ulcer which progressively got worse.  She wears denture.  Not able to chew well.  She eats soft food. She was found to have right upper gum also with yellowish discharge noted. 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  Avoid piloting.,  Right orbital apex, also the right soft palate and palate and tonsil.  Furthermore there is evidence of intracranial extension along the right V2 at the foramen rotundum Small suspicious right level 1A lymph node measuring 7 mm.  Separate small indeterminate but suspicious enhancing soft tissue nodule of the left sublingual space.  Patient was referred to oncology for further evaluation management. Patient denies fever, chills, nausea vomiting diarrhea shortness of breath or cough.  Reports 10 out of 10 severe pain of right face and numbness.  She also has headache. Reports unintentional weight loss  She reports no contactable family members.  She is in the process of pointing her roommate [name] to be her power of attorney.  She also wants to record today's conversation for her roommate  [name]. Patient has 50-pack-year smoking history.  # seen by ENT Dr.Juengel and had a biopsy. Biopsy was sent to [facility] and the final pathology was positive for invasive squamous cell carcinoma, well-differentiated, keratinizing. 07/17/2020, brain MRI showed large right facial mass with right maxillary erosion involving the maxillary sinus, hard palate and pterygoid plates and muscles.  Perineural spreading along the multiple branches of the maxillary division of the right trigeminal nerve at the infraorbital canal, pterygopalatine fossa and foramen rotundum. 07/17/2020, PET scan showed Intense FDG uptake is associated with the large enhancing tumor which involves the right maxilla, right nasal cavity, right pterygoid palatine fossa, right orbital apex and right soft tissue palate and palatine tonsil. 2. Mild nonspecific FDG uptake is associated with the recently characterized suspicious right level 1A lymph node which measures 7 mm and has an SUV max of 2.12.  No signs of distant metastasis.  Aortic atherosclerosis.  Patient also reports a history of hypertension, dysrhythmia, history of V. tach.  Patient previously was on lisinopril, atenolol, amlodipine for which she is no longer taking at this point due to lack of medication coverage.  She also does not have any primary care provider. 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  On average she says she is takes 2 to 3 pills/day.  She also takes gabapentin 300 mg 3 times daily  # new onset of atrial fibrillation with rapid ventricular  response.  Patient was started on metoprolol.  Eliquis for anticoagulation.  # 09/06/2020- 09/11/2020 admitted due to A. fib, hypotension, multifocal pneumonia.  Patient was treated with IV antibiotics.  Cardiology saw the patient during admission and  added amiodarone.  Patient was discharged on 5 days course of oral antibiotics, continued on metoprolol and amiodarone for atrial fibrillation.  # Dysphagia, she declined PET tube placment.  #08/08/2020-10/11/2020 concurrent cisplatin and radiation.  INTERVAL HISTORY Shannon Schroeder is a 64 y.o. female who has above history reviewed by me today presents for follow up visit for management of head and neck cancer, post hospitalization follow up  Problems and complaints are listed below:  Patient reports dysphagia is better.  She has no difficulty with liquids and semisolid food.   She uses Magic mouthwash rinse which helps her symptoms. Weight is stable.   Review of Systems  Constitutional: Positive for fatigue. Negative for appetite change, chills, fever and unexpected weight change.  HENT:   Negative for hearing loss, mouth sores and voice change.        Facial swelling, pain and numbness improved.  Eyes: Negative for eye problems.  Respiratory: Negative for chest tightness, cough and shortness of breath.   Cardiovascular: Negative for chest pain and palpitations.  Gastrointestinal: Negative for abdominal distention, abdominal pain, blood in stool, constipation, nausea and vomiting.       Dysphagia  Endocrine: Negative for hot flashes.  Genitourinary: Negative for difficulty urinating and frequency.   Musculoskeletal: Negative for arthralgias.  Skin: Negative for itching and rash.  Neurological: Negative for extremity weakness and headaches.  Hematological: Negative for adenopathy.  Psychiatric/Behavioral: Negative for confusion.    MEDICAL HISTORY:  Past Medical History:  Diagnosis Date  . 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   Marland Kitchen Anxiety   . Arthritis   . Bipolar disorder (HCC)   . COPD (chronic obstructive pulmonary disease) (HCC)    NO INHALERS  . Depression   . Dysrhythmia    H/O V TACH  . Head and neck cancer (HCC)  07/29/2020  . Headache    H/O MIGRAINES  . Hypertension   . Seizures (HCC)    X1 AFTER FALL    SURGICAL HISTORY: Past Surgical History:  Procedure Laterality Date  . ABDOMINAL HYSTERECTOMY     partial  . BREAST SURGERY     biopsy  . CARDIAC CATHETERIZATION     X 2  . CARPAL TUNNEL RELEASE Right   . CARPECTOMY HAND Right   . CATARACT EXTRACTION W/PHACO Left 03/08/2018   Procedure: CATARACT EXTRACTION PHACO AND INTRAOCULAR LENS PLACEMENT (IOC);  Surgeon: Galen Manila, MD;  Location: [facility] ORS;  Service: Ophthalmology;  Laterality: Left;  [place] 00:55 AP% 13.5 CDE 7.52 Fluid pack lot # [id number]  . CATARACT EXTRACTION W/PHACO Right 04/05/2018   Procedure: CATARACT EXTRACTION PHACO AND INTRAOCULAR LENS PLACEMENT (IOC);  Surgeon: Galen Manila, MD;  Location: [facility] ORS;  Service: Ophthalmology;  Laterality: Right;  [place] 00:36 AP% 15.9 CDE 5.72 Fluid pack lot # [id number]  . CEREBRAL ANEURYSM REPAIR    . CHOLECYSTECTOMY    . COLONOSCOPY WITH PROPOFOL N/A 04/26/2019   Procedure: COLONOSCOPY WITH PROPOFOL;  Surgeon: Wyline Mood, MD;  Location: [facility] ENDOSCOPY;  Service: Gastroenterology;  Laterality: N/A;  . PORTACATH PLACEMENT Left 08/05/2020   Procedure: INSERTION PORT-A-CATH;  Surgeon: Carolan Shiver, MD;  Location: [facility] ORS;  Service: General;  Laterality: Left;  . TUBAL LIGATION  SOCIAL HISTORY: Social History   Socioeconomic History  . Marital status: Divorced    Spouse name: Not on file  . Number of children: Not on file  . Years of education: 70  . Highest education level: Bachelor's degree (e.g., BA, AB, BS)  Occupational History  . Occupation: farmer  Tobacco Use  . Smoking status: Current Every Day Smoker    Packs/day: 0.25    Years: 50.00    Pack years: 12.50    Types: Cigarettes  . Smokeless tobacco: Never Used  Vaping Use  . Vaping Use: Never used  Substance and Sexual Activity  . Alcohol use: No  . Drug use: No  . Sexual activity: Not Currently   Other Topics Concern  . Not on file  Social History Narrative   Pt lives at [facility].    Social Determinants of Health   Financial Resource Strain: Not on file  Food Insecurity: Not on file  Transportation Needs: Not on file  Physical Activity: Not on file  Stress: Not on file  Social Connections: Not on file  Intimate Partner Violence: Not on file    FAMILY HISTORY: Family History  Problem Relation Age of Onset  . Hypertension Mother   . Heart failure Mother   . Hypertension Brother   . Stroke Brother   . Hypertension Son   . Emphysema Maternal Aunt   . Hypertension Paternal Aunt   . Hypertension Paternal Uncle   . Hypertension Maternal Grandmother   . Hypertension Paternal Grandmother     ALLERGIES:  is allergic to aspirin, belladonna alkaloids, fluoxetine hcl, naproxen, phenobarbital, and prozac [fluoxetine hcl].  MEDICATIONS:  Current Outpatient Medications  Medication Sig Dispense Refill  . acyclovir (ZOVIRAX) 400 MG tablet Take 1 tablet (400 mg total) by mouth 2 (two) times daily. 60 tablet 0  . amiodarone (PACERONE) 200 MG tablet Take 1 tablet (200 mg total) by mouth daily. 90 tablet 1  . apixaban (ELIQUIS) 5 MG TABS tablet Take 1 tablet (5 mg total) by mouth 2 (two) times daily. 60 tablet 0  . chlorhexidine (PERIDEX) 0.12 % solution Use as directed 10 mLs in the mouth or throat 2 (two) times daily. Swish and spit 473 mL 1  . docusate sodium (COLACE) 100 MG capsule Take 1 capsule (100 mg total) by mouth daily. 30 capsule 1  . feeding supplement, ENSURE ENLIVE, (ENSURE ENLIVE) LIQD Take 237 mLs by mouth 3 (three) times daily between meals. 237 mL 12  . fentaNYL (DURAGESIC) 25 MCG/HR Place 1 patch onto the skin every 3 (three) days. 7 patch 0  . furosemide (LASIX) 20 MG tablet Take 1 tablet (20 mg total) by mouth daily as needed (for lower extremity swelling). 90 tablet 1  . gabapentin (NEURONTIN) 300 MG capsule Take 1 capsule (300 mg total) by mouth 3  (three) times daily. 90 capsule 0  . lidocaine-prilocaine (EMLA) cream Apply to affected area once (Patient taking differently: Apply 1 application topically as needed [facility]). Apply to affected area once) 30 g 3  . lisinopril (ZESTRIL) 20 MG tablet Take 1 tablet (20 mg total) by mouth daily.    . magnesium chloride (SLOW-MAG) 64 MG TBEC SR tablet Take 1 tablet (64 mg total) by mouth 2 (two) times daily. 60 tablet 1  . ondansetron (ZOFRAN-ODT) 8 MG disintegrating tablet Take 1 tablet (8 mg total) by mouth every 8 (eight) hours as needed for nausea or vomiting. 90 tablet 0  . oxyCODONE-acetaminophen (PERCOCET) 5-325 MG tablet  Take 2 tablets by mouth every 12 (twelve) hours as needed for severe pain. 60 tablet 0  . polyethylene glycol powder (GLYCOLAX/MIRALAX) 17 GM/SCOOP powder Take 17 g by mouth daily as needed for mild constipation or moderate constipation. 255 g 0  . potassium chloride 20 MEQ/15ML (10%) SOLN Take 30 mLs (40 mEq total) by mouth daily. [employer] to pay for medication 900 mL 0  . prochlorperazine (COMPAZINE) 10 MG tablet Take 1 tablet (10 mg total) by mouth every 6 (six) hours as needed (Nausea or vomiting). 30 tablet 1  . dexamethasone (DECADRON) 4 MG tablet Take 1 tablet (4 mg total) by mouth 2 (two) times daily. For 3 days (Patient not taking: No sig reported) 6 tablet 0  . Melatonin 10 MG TABS Take 10 mg by mouth at bedtime as needed (sleep).  (Patient not taking: No sig reported)    . Multiple Vitamins-Minerals (CENTRUM SILVER PO) Take 1 tablet by mouth daily. Gummie (Patient not taking: No sig reported)    . nicotine (NICODERM CQ - DOSED IN MG/24 HOURS) 21 mg/24hr patch Place 1 patch (21 mg total) onto the skin daily. (Patient not taking: No sig reported) 28 patch 0   No current facility-administered medications for this visit.   Facility-Administered Medications Ordered in Other Visits  Medication Dose Route Frequency Provider Last Rate Last Admin  . sodium chloride flush (NS)  0.9 % injection 10 mL  10 mL Intravenous PRN Earlie Server, MD   10 mL at 10/10/20 1040     PHYSICAL EXAMINATION: ECOG PERFORMANCE STATUS: 1 - Symptomatic but completely ambulatory Vitals:   10/29/20 1052  BP: (!) 151/82  Pulse: 64  Resp: 17  Temp: (!) 96.7 F (35.9 C)   Filed Weights   10/29/20 1052  Weight: 115 lb (52.2 kg)    Physical Exam Constitutional:      General: She is not in acute distress.    Comments: Thin built.  HENT:     Head: Normocephalic and atraumatic.     Mouth/Throat:     Comments: No thrush.  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 Cardiovascular:     Rate and Rhythm: Normal rate and regular rhythm.     Heart sounds: Normal heart sounds.  Pulmonary:     Effort: Pulmonary effort is normal. No respiratory distress.     Breath sounds: No wheezing.  Abdominal:     General: Bowel sounds are normal. There is no distension.     Palpations: Abdomen is soft.  Musculoskeletal:        General: No deformity. Normal range of motion.     Cervical back: Normal range of motion and neck supple.  Skin:    General: Skin is warm and dry.     Findings: No erythema or rash.  Neurological:     Mental Status: She is alert and oriented to person, place, and time. Mental status is at baseline.     Cranial Nerves: No cranial nerve deficit.     Coordination: Coordination normal.  Psychiatric:        Mood and Affect: Mood normal.     LABORATORY DATA:  I have reviewed the data as listed Lab Results  Component Value Date   WBC 4.3 10/29/2020   HGB 10.2 (L) 10/29/2020   HCT 29.8 (L) 10/29/2020   MCV 101.4 (H) 10/29/2020   PLT 225  10/29/2020   Recent Labs    07/10/20 1311 07/12/20 1204 08/08/20 0821 10/22/20 0933 10/24/20 0915 10/29/20 1042  NA 136 137   < > 133* 132* 132*  K 2.9* 4.0   < > 2.9* 3.1* 3.4*  CL 97* 98   < > 95* 96* 96*  CO2 28 27   < > 27 27 28    GLUCOSE 100* 114*   < > 94 79 94  BUN 13 8   < > 12 7* 7*  CREATININE 0.43* 0.42*   < > 0.48 0.46 0.59  CALCIUM 8.9 9.0   < > 8.5* 8.4* 8.8*  GFRNONAA >60 >60   < > >60 >60 >60  GFRAA >60 >60  --   --   --   --   PROT 7.0  --    < > 6.0* 5.8* 6.5  ALBUMIN 3.7  --    < > 3.4* 3.2* 3.6  AST 20  --    < > 17 16 19   ALT 16  --    < > 11 10 9   ALKPHOS 106  --    < > 71 72 73  BILITOT 0.5  --    < > 0.6 0.9 0.6   < > = values in this interval not displayed.   Iron/TIBC/Ferritin/ %Sat    Component Value Date/Time   IRON 46 09/09/2020 0938   IRON 37 05/03/2018 1826   TIBC 210 (L) 09/09/2020 0938   FERRITIN 136 09/09/2020 0938   FERRITIN 52 05/03/2018 1826   IRONPCTSAT 22 09/09/2020 0938      RADIOGRAPHIC STUDIES: I have personally reviewed the radiological images as listed and agreed with the findings in the report. No results found.    ASSESSMENT & PLAN:  1. Encounter for antineoplastic chemotherapy   2. 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[place])   3. History of atrial fibrillation   4. Hypokalemia   5. Hypomagnesemia   6. 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  Status post concurrent chemotherapy and radiation.  Clinically she has responded very well to the treatments. Recently had an examination by ENT.  No visually residual disease per patient. Plan to repeat PET scan 12 weeks after she finishes treatments-beginning of March 2021.   Chemotherapy-induced mucositis.  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 Recommend patient to utilize Magic mouthwash as needed.  #  hypokalemia, plan IVF 1 L normal saline with 53meq KCl today.  Continue oral potassium to 65meq daily  # Hypomagnesia, continue oral magnesium.  Plan IV magnesium sulfate 2 g x 1 today. Patient is to continue taking oral magnesium supplementation. #Improved symptoms of dysphagia, weight loss IV fluid hydration. Marland Kitchen  #Atrial fibrillation.  Continue amiodarone  and metoprolol.  And Eliquis   ##neoplasm induced pain, continue fentanyl patch 60mcg/h Q72 hours. .  Continue garbapentine 300mg  TID.  Occasionally she takes a oxycodone for pain.  All questions were answered. The patient knows to call the clinic with any problems questions or concerns.   Return of visit: Weekly BMP and magnesium +/- IV fluid/potassium/magnesium. Follow-up with me in 3 weeks for lab MD +/- IV fluid/potassium/magnesium  Earlie Server, MD, PhD Hematology Oncology [facility] at [facility] Pager- [phone] 10/29/2020

## 2020-11-06 ENCOUNTER — Inpatient Hospital Stay: Payer: Medicare Other | Attending: Oncology

## 2020-11-06 ENCOUNTER — Inpatient Hospital Stay: Payer: Medicare Other

## 2020-11-06 VITALS — BP 147/85 | HR 63 | Temp 97.7°F | Resp 17

## 2020-11-06 DIAGNOSIS — E860 Dehydration: Secondary | ICD-10-CM | POA: Insufficient documentation

## 2020-11-06 DIAGNOSIS — C310 Malignant neoplasm of maxillary sinus: Secondary | ICD-10-CM

## 2020-11-06 DIAGNOSIS — E876 Hypokalemia: Secondary | ICD-10-CM

## 2020-11-06 DIAGNOSIS — Z923 Personal history of irradiation: Secondary | ICD-10-CM | POA: Diagnosis not present

## 2020-11-06 DIAGNOSIS — Z79899 Other long term (current) drug therapy: Secondary | ICD-10-CM | POA: Insufficient documentation

## 2020-11-06 DIAGNOSIS — F17210 Nicotine dependence, cigarettes, uncomplicated: Secondary | ICD-10-CM | POA: Insufficient documentation

## 2020-11-06 DIAGNOSIS — Z79891 Long term (current) use of opiate analgesic: Secondary | ICD-10-CM | POA: Insufficient documentation

## 2020-11-06 DIAGNOSIS — I10 Essential (primary) hypertension: Secondary | ICD-10-CM | POA: Insufficient documentation

## 2020-11-06 DIAGNOSIS — G893 Neoplasm related pain (acute) (chronic): Secondary | ICD-10-CM | POA: Diagnosis not present

## 2020-11-06 DIAGNOSIS — K1231 Oral mucositis (ulcerative) due to antineoplastic therapy: Secondary | ICD-10-CM | POA: Diagnosis not present

## 2020-11-06 DIAGNOSIS — Z9221 Personal history of antineoplastic chemotherapy: Secondary | ICD-10-CM | POA: Insufficient documentation

## 2020-11-06 DIAGNOSIS — F319 Bipolar disorder, unspecified: Secondary | ICD-10-CM | POA: Insufficient documentation

## 2020-11-06 DIAGNOSIS — J449 Chronic obstructive pulmonary disease, unspecified: Secondary | ICD-10-CM | POA: Insufficient documentation

## 2020-11-06 DIAGNOSIS — E871 Hypo-osmolality and hyponatremia: Secondary | ICD-10-CM

## 2020-11-06 DIAGNOSIS — I4891 Unspecified atrial fibrillation: Secondary | ICD-10-CM | POA: Insufficient documentation

## 2020-11-06 DIAGNOSIS — Z7901 Long term (current) use of anticoagulants: Secondary | ICD-10-CM | POA: Diagnosis not present

## 2020-11-06 DIAGNOSIS — Z8679 Personal history of other diseases of the circulatory system: Secondary | ICD-10-CM

## 2020-11-06 LAB — BASIC METABOLIC PANEL
Anion gap: 8 (ref 5–15)
BUN: 14 mg/dL (ref 8–23)
CO2: 26 mmol/L (ref 22–32)
Calcium: 8.2 mg/dL — ABNORMAL LOW (ref 8.9–10.3)
Chloride: 96 mmol/L — ABNORMAL LOW (ref 98–111)
Creatinine, Ser: 0.49 mg/dL (ref 0.44–1.00)
GFR, Estimated: >60 mL/min (ref >60)
Glucose, Bld: 72 mg/dL (ref 70–99)
Potassium: 3.7 mmol/L (ref 3.5–5.1)
Sodium: 130 mmol/L — ABNORMAL LOW (ref 135–145)

## 2020-11-06 LAB — MAGNESIUM: Magnesium: 1.7 mg/dL (ref 1.7–2.4)

## 2020-11-06 MED ORDER — SODIUM CHLORIDE 0.9 % IV SOLN
Freq: Once | INTRAVENOUS | Status: AC
  Filled 2020-11-06: qty 250

## 2020-11-06 MED ORDER — HEPARIN SOD (PORK) LOCK FLUSH 100 UNIT/ML IV SOLN
500.0000 [IU] | Freq: Once | INTRAVENOUS | Status: AC
  Administered 2020-11-06: 500 [IU] via INTRAVENOUS
  Filled 2020-11-06: qty 5

## 2020-11-06 MED ORDER — SODIUM CHLORIDE 0.9% FLUSH
10.0000 mL | INTRAVENOUS | Status: DC | PRN
  Administered 2020-11-06: 10 mL via INTRAVENOUS
  Filled 2020-11-06: qty 10

## 2020-11-06 NOTE — Progress Notes (Signed)
Pt has started eating solid foods. Her electrolytes are normal today. Pt will receive 1 liter of NS. Reports her pain is increasing on the right side of her face. She has bouts of redness and swelling as well. She is wearing her fentanyl patch, taking percocet and tylenol arthritis. Discussed concern over taking too much acetaminophen. Pain is rated 7/10 on pain scale. Notified MD. VSS. Ambulatory at discharge. I told pt someone would call her when MD advises what to do about pain.

## 2020-11-07 ENCOUNTER — Other Ambulatory Visit: Payer: Self-pay

## 2020-11-07 ENCOUNTER — Inpatient Hospital Stay (HOSPITAL_BASED_OUTPATIENT_CLINIC_OR_DEPARTMENT_OTHER): Payer: Medicare Other | Admitting: Hospice and Palliative Medicine

## 2020-11-07 DIAGNOSIS — C310 Malignant neoplasm of maxillary sinus: Secondary | ICD-10-CM | POA: Diagnosis not present

## 2020-11-07 DIAGNOSIS — G893 Neoplasm related pain (acute) (chronic): Secondary | ICD-10-CM

## 2020-11-07 DIAGNOSIS — Z515 Encounter for palliative care: Secondary | ICD-10-CM | POA: Diagnosis not present

## 2020-11-07 MED ORDER — AMOXICILLIN-POT CLAVULANATE 875-125 MG PO TABS: 1.0000 | ORAL_TABLET | Freq: Two times a day (BID) | ORAL | 0 refills | Status: DC

## 2020-11-07 NOTE — Progress Notes (Signed)
Virtual Visit via Telephone Note  I connected with Shannon Schroeder on 11/07/20 at  9:30 AM EST by telephone and verified that I am speaking with the correct person using two identifiers.   I discussed the limitations, risks, security and privacy concerns of performing an evaluation and management service by telephone and the availability of in person appointments. I also discussed with the patient that there may be a patient responsible charge related to this service. The patient expressed understanding and agreed to proceed.   History of Present Illness: Shannon Schroeder is a 65 y.o. 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  MRI of the brain on 07/17/2020 revealed large right facial mass with right maxillary erosion and perineural spread along multiple branches of the maxillary division and right trigeminal nerve.  Patient was referred to palliative care to help address goals and manage ongoing symptoms such as pain.   Observations/Objective: Patient was an add-on today with complaint of worsening pain over the past several days.  Virtual visit attempted but patient's Internet was down and she was unable to log onto my chart.  I spoke with patient by telephone instead.  Patient reports several days of worse right-sided facial pain.  She denies redness and drainage is no worse than normal.  However, she feels like her face is slightly swollen and that her sinuses are full.  No fever or chills.  No respiratory symptoms.  Case discussed with Dr. Cathie Hoops.  Given patient's history of infection, it was recommended to start her empirically on antibiotics.  Patient was in agreement with this plan.  We will start her on 10-day course of Augmentin.  Patient reports that pain has been relatively  stable on regimen of fentanyl, Percocet, and gabapentin.  However note that Tegretol could be considered as first-line for trigeminal neuralgia if needed.  Assessment and Plan: H&N cancer -status post XRT, on cisplatin.  Followed by Dr. Cathie Hoops.  Neoplasm related pain -symptoms concerning for early infection.  Continue current pain regimen.  Start Augmentin 875 mg twice daily x10 days.  Patient to follow-up with Dr. Cathie Hoops on 1/18  Follow Up Instructions: With Dr. Cathie Hoops on 1/18   I discussed the assessment and treatment plan with the patient. The patient was provided an opportunity to ask questions and all were answered. The patient agreed with the plan and demonstrated an understanding of the instructions.   The patient was advised to call back or seek an in-person evaluation if the symptoms worsen or if the condition fails to improve as anticipated.  I provided 10 minutes of non-face-to-face time during this encounter.   Malachy Moan, NP

## 2020-11-11 ENCOUNTER — Telehealth: Payer: Self-pay | Admitting: *Deleted

## 2020-11-11 ENCOUNTER — Other Ambulatory Visit: Payer: Self-pay | Admitting: Hospice and Palliative Medicine

## 2020-11-11 DIAGNOSIS — C760 Malignant neoplasm of head, face and neck: Secondary | ICD-10-CM

## 2020-11-11 MED ORDER — FENTANYL 25 MCG/HR TD PT72: 1.0000 | MEDICATED_PATCH | TRANSDERMAL | 0 refills | Status: DC

## 2020-11-11 MED ORDER — OXYCODONE-ACETAMINOPHEN 5-325 MG PO TABS: 2.0000 | ORAL_TABLET | Freq: Two times a day (BID) | ORAL | 0 refills | Status: DC | PRN

## 2020-11-11 MED ORDER — LIDOCAINE-PRILOCAINE 2.5-2.5 % EX CREA: 1.0000 "application " | TOPICAL_CREAM | CUTANEOUS | 2 refills | Status: DC | PRN

## 2020-11-11 NOTE — Telephone Encounter (Signed)
I spoke with her by phone.  Refill sent in for fentanyl, Percocet, and Emla cream.  She reports improvement in facial swelling and symptoms on Augmentin.

## 2020-11-11 NOTE — Telephone Encounter (Signed)
Patient requesting a call from J Borders, NPO to review medications.

## 2020-11-11 NOTE — Progress Notes (Signed)
Received a call from patient.  She requested refill of her Percocet, fentanyl, and Emla cream.  PDMP reviewed.  Patient reports that her facial swelling and symptoms seem to be improving on Augmentin.  Discussed taking probiotics or daily yogurt.

## 2020-11-13 ENCOUNTER — Inpatient Hospital Stay: Payer: Medicare Other

## 2020-11-13 VITALS — BP 106/64 | HR 65 | Temp 97.3°F | Wt 111.6 lb

## 2020-11-13 DIAGNOSIS — Z8679 Personal history of other diseases of the circulatory system: Secondary | ICD-10-CM

## 2020-11-13 DIAGNOSIS — G893 Neoplasm related pain (acute) (chronic): Secondary | ICD-10-CM

## 2020-11-13 DIAGNOSIS — C760 Malignant neoplasm of head, face and neck: Secondary | ICD-10-CM

## 2020-11-13 DIAGNOSIS — C310 Malignant neoplasm of maxillary sinus: Secondary | ICD-10-CM

## 2020-11-13 DIAGNOSIS — E871 Hypo-osmolality and hyponatremia: Secondary | ICD-10-CM

## 2020-11-13 DIAGNOSIS — E876 Hypokalemia: Secondary | ICD-10-CM

## 2020-11-13 LAB — BASIC METABOLIC PANEL
Anion gap: 12 (ref 5–15)
BUN: 16 mg/dL (ref 8–23)
CO2: 22 mmol/L (ref 22–32)
Calcium: 8.7 mg/dL — ABNORMAL LOW (ref 8.9–10.3)
Chloride: 96 mmol/L — ABNORMAL LOW (ref 98–111)
Creatinine, Ser: 0.47 mg/dL (ref 0.44–1.00)
GFR, Estimated: >60 mL/min (ref >60)
Glucose, Bld: 137 mg/dL — ABNORMAL HIGH (ref 70–99)
Potassium: 3.6 mmol/L (ref 3.5–5.1)
Sodium: 130 mmol/L — ABNORMAL LOW (ref 135–145)

## 2020-11-13 LAB — MAGNESIUM: Magnesium: 1.9 mg/dL (ref 1.7–2.4)

## 2020-11-13 MED ORDER — HEPARIN SOD (PORK) LOCK FLUSH 100 UNIT/ML IV SOLN
500.0000 [IU] | Freq: Once | INTRAVENOUS | Status: AC
  Administered 2020-11-13: 500 [IU] via INTRAVENOUS
  Filled 2020-11-13: qty 5

## 2020-11-13 MED ORDER — SODIUM CHLORIDE 0.9 % IV SOLN
Freq: Once | INTRAVENOUS | Status: AC
  Filled 2020-11-13: qty 250

## 2020-11-13 MED ORDER — SODIUM CHLORIDE 0.9% FLUSH
10.0000 mL | INTRAVENOUS | Status: AC | PRN
  Administered 2020-11-13: 10 mL via INTRAVENOUS
  Filled 2020-11-13: qty 10

## 2020-11-13 NOTE — Progress Notes (Signed)
Right sided facial pain and swelling is so much better. She was diagnosed with a sinus infection. Antibiotics have helped a lot. Maintains cough and sputum yellow in color, improving. Eating food. Testing different forms of foods with ability to swallow and tolerance. No nausea. Received IV hydration for hyponatremia today. No complaints at discharge.

## 2020-11-14 ENCOUNTER — Telehealth: Payer: Self-pay

## 2020-11-14 NOTE — Telephone Encounter (Signed)
Nutrition  Complimentary case of ensure plus left at front desk for patient to pick up on 1/14  Nell Schrack B. Zenia Resides, [place], [place] Registered Dietitian [phone] (mobile)

## 2020-11-15 ENCOUNTER — Other Ambulatory Visit: Payer: Self-pay

## 2020-11-15 ENCOUNTER — Ambulatory Visit
Admission: RE | Admit: 2020-11-15 | Discharge: 2020-11-15 | Disposition: A | Discharge status: Home / Self Care | Payer: Medicare Other | Source: Ambulatory Visit | Attending: Radiation Oncology | Admitting: Radiation Oncology

## 2020-11-15 ENCOUNTER — Encounter: Payer: Self-pay | Admitting: Radiation Oncology

## 2020-11-15 VITALS — BP 120/75 | HR 84 | Temp 96.5°F | Resp 16 | Wt 110.2 lb

## 2020-11-15 DIAGNOSIS — Z9221 Personal history of antineoplastic chemotherapy: Secondary | ICD-10-CM | POA: Insufficient documentation

## 2020-11-15 DIAGNOSIS — Z923 Personal history of irradiation: Secondary | ICD-10-CM | POA: Diagnosis not present

## 2020-11-15 DIAGNOSIS — C760 Malignant neoplasm of head, face and neck: Secondary | ICD-10-CM

## 2020-11-15 DIAGNOSIS — C310 Malignant neoplasm of maxillary sinus: Secondary | ICD-10-CM | POA: Diagnosis present

## 2020-11-15 NOTE — Progress Notes (Signed)
Radiation Oncology Follow up Note  Name: Shannon Schroeder   Date:   11/15/2020 MRN:  [id number] DOB: 24-Oct-1956    This 65 y.o. female presents to the clinic today for 1 month follow-up status post concurrent chemoradiation therapy for locally advanced.  Squamous cell carcinoma of the right maxillary sinus involving the sinus hard palate pterygoid plates and muscles  REFERRING PROVIDER: No ref. provider found  HPI: Patient is a 65 year old female now out 1 month.  Concurrent chemotherapy and radiation therapy for a stage T4BN1 squamous cell carcinoma of the maxillary sinus.  She is seen today in routine follow-up and is doing well.  According to Dr. Yvone Neu he believes she has had a complete response.  She is having no head or neck pain and no dysphagia.  COMPLICATIONS OF TREATMENT: none  FOLLOW UP COMPLIANCE: keeps appointments   PHYSICAL EXAM:  BP 120/75   Pulse 84   Temp (!) 96.5 F (35.8 C)   Resp 16   Wt 110 lb 3.2 oz (50 kg)   LMP 11/19/1988 (Exact Date)   SpO2 100%   BMI 17.79 kg/m  Oral cavity shows granulation tissue no evidence of active disease or ulceration.  There is still a defect into the maxillary sinus.  Neck is clear.  Well-developed well-nourished patient in NAD. HEENT reveals PERLA, EOMI, discs not visualized.  Oral cavity is clear. No oral mucosal lesions are identified. Neck is clear without evidence of cervical or supraclavicular adenopathy. Lungs are clear to A&P. Cardiac examination is essentially unremarkable with regular rate and rhythm without murmur rub or thrill. Abdomen is benign with no organomegaly or masses noted. Motor sensory and DTR levels are equal and symmetric in the upper and lower extremities. Cranial nerves II through XII are grossly intact. Proprioception is intact. No peripheral adenopathy or edema is identified. No motor or sensory levels are noted. Crude visual fields are within normal range.  RADIOLOGY RESULTS: No current films for review  PET CT scan will be performed in approximately 10 weeks  PLAN: Present time patient is doing well emesis by clinical exam a complete response.  She continues close follow-up care to by Dr. Yvone Neu with monthly examinations.  She also continues follow-up care with Dr. Tasia Catchings.  I have asked to see her back in 3 months at which time PET CT scan should have been performed.  Patient is to call sooner with any concerns.  I would like to take this opportunity to thank you for allowing me to participate in the care of your patient.Noreene Filbert, MD

## 2020-11-19 ENCOUNTER — Inpatient Hospital Stay: Payer: Medicare Other

## 2020-11-19 ENCOUNTER — Encounter: Payer: Self-pay | Admitting: Oncology

## 2020-11-19 ENCOUNTER — Inpatient Hospital Stay (HOSPITAL_BASED_OUTPATIENT_CLINIC_OR_DEPARTMENT_OTHER): Payer: Medicare Other | Admitting: Oncology

## 2020-11-19 VITALS — BP 127/82 | HR 94 | Temp 98.7°F | Resp 18 | Wt 110.3 lb

## 2020-11-19 VITALS — BP 136/75 | HR 69 | Temp 97.8°F | Resp 18

## 2020-11-19 DIAGNOSIS — E876 Hypokalemia: Secondary | ICD-10-CM | POA: Diagnosis not present

## 2020-11-19 DIAGNOSIS — Z8679 Personal history of other diseases of the circulatory system: Secondary | ICD-10-CM | POA: Diagnosis not present

## 2020-11-19 DIAGNOSIS — G893 Neoplasm related pain (acute) (chronic): Secondary | ICD-10-CM

## 2020-11-19 DIAGNOSIS — C310 Malignant neoplasm of maxillary sinus: Secondary | ICD-10-CM

## 2020-11-19 DIAGNOSIS — E860 Dehydration: Secondary | ICD-10-CM

## 2020-11-19 LAB — CBC WITH DIFFERENTIAL/PLATELET
Abs Immature Granulocytes: 0.03 10*3/uL (ref 0.00–0.07)
Basophils Absolute: 0 10*3/uL (ref 0.0–0.1)
Basophils Relative: 0 %
Eosinophils Absolute: 0 10*3/uL (ref 0.0–0.5)
Eosinophils Relative: 0 %
HCT: 31.6 % — ABNORMAL LOW (ref 36.0–46.0)
Hemoglobin: 10.8 g/dL — ABNORMAL LOW (ref 12.0–15.0)
Immature Granulocytes: 0 %
Lymphocytes Relative: 7 %
Lymphs Abs: 0.5 10*3/uL — ABNORMAL LOW (ref 0.7–4.0)
MCH: 35.6 pg — ABNORMAL HIGH (ref 26.0–34.0)
MCHC: 34.2 g/dL (ref 30.0–36.0)
MCV: 104.3 fL — ABNORMAL HIGH (ref 80.0–100.0)
Monocytes Absolute: 0.9 10*3/uL (ref 0.1–1.0)
Monocytes Relative: 12 %
Neutro Abs: 5.7 10*3/uL (ref 1.7–7.7)
Neutrophils Relative %: 81 %
Platelets: 390 10*3/uL (ref 150–400)
RBC: 3.03 MIL/uL — ABNORMAL LOW (ref 3.87–5.11)
RDW: 18.5 % — ABNORMAL HIGH (ref 11.5–15.5)
WBC: 7.1 10*3/uL (ref 4.0–10.5)
nRBC: 0 % (ref 0.0–0.2)

## 2020-11-19 LAB — BASIC METABOLIC PANEL
Anion gap: 13 (ref 5–15)
BUN: 18 mg/dL (ref 8–23)
CO2: 21 mmol/L — ABNORMAL LOW (ref 22–32)
Calcium: 9 mg/dL (ref 8.9–10.3)
Chloride: 98 mmol/L (ref 98–111)
Creatinine, Ser: 0.39 mg/dL — ABNORMAL LOW (ref 0.44–1.00)
GFR, Estimated: >60 mL/min (ref >60)
Glucose, Bld: 124 mg/dL — ABNORMAL HIGH (ref 70–99)
Potassium: 4 mmol/L (ref 3.5–5.1)
Sodium: 132 mmol/L — ABNORMAL LOW (ref 135–145)

## 2020-11-19 LAB — MAGNESIUM: Magnesium: 1.8 mg/dL (ref 1.7–2.4)

## 2020-11-19 MED ORDER — HEPARIN SOD (PORK) LOCK FLUSH 100 UNIT/ML IV SOLN
500.0000 [IU] | Freq: Once | INTRAVENOUS | Status: AC
  Administered 2020-11-19: 500 [IU] via INTRAVENOUS
  Filled 2020-11-19: qty 5

## 2020-11-19 MED ORDER — SODIUM CHLORIDE 0.9 % IV SOLN
Freq: Once | INTRAVENOUS | Status: AC
  Filled 2020-11-19: qty 250

## 2020-11-19 MED ORDER — SODIUM CHLORIDE 0.9% FLUSH
10.0000 mL | INTRAVENOUS | Status: DC | PRN
  Administered 2020-11-19: 10 mL via INTRAVENOUS
  Filled 2020-11-19: qty 10

## 2020-11-19 MED ORDER — AMOXICILLIN-POT CLAVULANATE 875-125 MG PO TABS: 1.0000 | ORAL_TABLET | Freq: Two times a day (BID) | ORAL | 0 refills | Status: DC

## 2020-11-19 NOTE — Progress Notes (Signed)
Hematology/Oncology follow up note [facility] [phone] [phone]   Patient Care Team: Pcp, No as PCP - General Rockey Situ, Kathlene November, MD as PCP - Cardiology (Cardiology) Earlie Server, MD as Consulting Physician (Oncology)  CHIEF COMPLAINTS/REASON FOR VISIT:  Follow-up for head and neck cancer  HISTORY OF PRESENTING ILLNESS:   Shannon Schroeder is a  65 y.o.  female with PMH listed below was seen in consultation at the request of ER Dr. [name] for evaluation of abnormal CT scan.  07/10/2020 she presented emergency room for evaluation of right-sided facial swelling and pain in the right side of her mouth for the past 6 weeks.  It started in her mouth with a small ulcer which progressively got worse.  She wears denture.  Not able to chew well.  She eats soft food. She was found to have right upper gum also with yellowish discharge noted. 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  Avoid piloting.,  Right orbital apex, also the right soft palate and palate and tonsil.  Furthermore there is evidence of intracranial extension along the right V2 at the foramen rotundum Small suspicious right level 1A lymph node measuring 7 mm.  Separate small indeterminate but suspicious enhancing soft tissue nodule of the left sublingual space.  Patient was referred to oncology for further evaluation management. Patient denies fever, chills, nausea vomiting diarrhea shortness of breath or cough.  Reports 10 out of 10 severe pain of right face and numbness.  She also has headache. Reports unintentional weight loss  She reports no contactable family members.  She is in the process of pointing her roommate [name] to be her power of attorney.  She also wants to record today's conversation for her roommate  [name]. Patient has 50-pack-year smoking history.  # seen by ENT Dr.Juengel and had a biopsy. Biopsy was sent to [facility] and the final pathology was positive for invasive squamous cell carcinoma, well-differentiated, keratinizing. 07/17/2020, brain MRI showed large right facial mass with right maxillary erosion involving the maxillary sinus, hard palate and pterygoid plates and muscles.  Perineural spreading along the multiple branches of the maxillary division of the right trigeminal nerve at the infraorbital canal, pterygopalatine fossa and foramen rotundum. 07/17/2020, PET scan showed Intense FDG uptake is associated with the large enhancing tumor which involves the right maxilla, right nasal cavity, right pterygoid palatine fossa, right orbital apex and right soft tissue palate and palatine tonsil. 2. Mild nonspecific FDG uptake is associated with the recently characterized suspicious right level 1A lymph node which measures 7 mm and has an SUV max of 2.12.  No signs of distant metastasis.  Aortic atherosclerosis.  Patient also reports a history of hypertension, dysrhythmia, history of V. tach.  Patient previously was on lisinopril, atenolol, amlodipine for which she is no longer taking at this point due to lack of medication coverage.  She also does not have any primary care provider. 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  On average she says she is takes 2 to 3 pills/day.  She also takes gabapentin 300 mg 3 times daily  # new onset of atrial fibrillation with rapid ventricular  response.  Patient was started on metoprolol.  Eliquis for anticoagulation.  # 09/06/2020- 09/11/2020 admitted due to A. fib, hypotension, multifocal pneumonia.  Patient was treated with IV antibiotics.  Cardiology saw the patient during admission and  added amiodarone.  Patient was discharged on 5 days course of oral antibiotics, continued on metoprolol and amiodarone for atrial fibrillation.  # Dysphagia, she declined PET tube placment.  #08/08/2020-10/11/2020 concurrent cisplatin and radiation.  INTERVAL HISTORY Shannon Schroeder is a 65 y.o. female who has above history reviewed by me today presents for follow up visit for management of head and neck cancer, post hospitalization follow up  Problems and complaints are listed below:  Patient reports dysphagia is better. Right facial pain and swelling right better after she was started on Augmentin.  She finishes the course.  She reports that the right facial pain has come back.  Denies any fever, chills.  She has ENT appointment in 1 week   Review of Systems  Constitutional: Positive for fatigue. Negative for appetite change, chills, fever and unexpected weight change.  HENT:   Negative for hearing loss, mouth sores and voice change.        Facial swelling, pain   Eyes: Negative for eye problems.  Respiratory: Negative for chest tightness, cough and shortness of breath.   Cardiovascular: Negative for chest pain and palpitations.  Gastrointestinal: Negative for abdominal distention, abdominal pain, blood in stool, constipation, nausea and vomiting.       Dysphagia  Endocrine: Negative for hot flashes.  Genitourinary: Negative for difficulty urinating and frequency.   Musculoskeletal: Negative for arthralgias.  Skin: Negative for itching and rash.  Neurological: Negative for extremity weakness and headaches.  Hematological: Negative for adenopathy.  Psychiatric/Behavioral: Negative for confusion.    MEDICAL HISTORY:  Past Medical History:  Diagnosis Date  . 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   Marland Kitchen Anxiety   . Arthritis   . Bipolar disorder ([place])   . COPD (chronic obstructive pulmonary disease) ([place])    NO INHALERS  .  Depression   . Dysrhythmia    H/O V TACH  . Head and neck cancer ([place]) 07/29/2020  . Headache    H/O MIGRAINES  . Hypertension   . Seizures ([place])    X1 AFTER FALL    SURGICAL HISTORY: Past Surgical History:  Procedure Laterality Date  . ABDOMINAL HYSTERECTOMY     partial  . BREAST SURGERY     biopsy  . CARDIAC CATHETERIZATION     X 2  . CARPAL TUNNEL RELEASE Right   . CARPECTOMY HAND Right   . CATARACT EXTRACTION W/PHACO Left 03/08/2018   Procedure: CATARACT EXTRACTION PHACO AND INTRAOCULAR LENS PLACEMENT (IOC);  Surgeon: Birder Robson, MD;  Location: [facility] ORS;  Service: Ophthalmology;  Laterality: Left;  [place] 00:55 AP% 13.5 CDE 7.52 Fluid pack lot # [id number]  . CATARACT EXTRACTION W/PHACO Right 04/05/2018   Procedure: CATARACT EXTRACTION PHACO AND INTRAOCULAR LENS PLACEMENT (IOC);  Surgeon: Birder Robson, MD;  Location: [facility] ORS;  Service: Ophthalmology;  Laterality: Right;  [place] 00:36 AP% 15.9 CDE 5.72 Fluid pack lot # [id number]  . CEREBRAL ANEURYSM REPAIR    . CHOLECYSTECTOMY    . COLONOSCOPY WITH PROPOFOL N/A 04/26/2019   Procedure: COLONOSCOPY WITH PROPOFOL;  Surgeon: Jonathon Bellows, MD;  Location: [facility] ENDOSCOPY;  Service: Gastroenterology;  Laterality: N/A;  . PORTACATH PLACEMENT Left 08/05/2020   Procedure: INSERTION PORT-A-CATH;  Surgeon: Herbert Pun, MD;  Location: [facility] ORS;  Service: General;  Laterality: Left;  . TUBAL LIGATION      SOCIAL HISTORY: Social History   Socioeconomic History  . Marital status: Divorced    Spouse name: Not on file  . Number of children: Not on file  . Years of education: 28  . Highest education level: Bachelor's degree (e.g., BA, AB, BS)  Occupational History  . Occupation: farmer  Tobacco Use  . Smoking status: Current Every Day Smoker    Packs/day: 0.25    Years: 50.00    Pack years: 12.50    Types: Cigarettes  . Smokeless tobacco: Never Used  Vaping Use  . Vaping Use: Never used  Substance and Sexual Activity   . Alcohol use: No  . Drug use: No  . Sexual activity: Not Currently  Other Topics Concern  . Not on file  Social History Narrative   Pt lives at [facility].    Social Determinants of Health   Financial Resource Strain: Not on file  Food Insecurity: Not on file  Transportation Needs: Not on file  Physical Activity: Not on file  Stress: Not on file  Social Connections: Not on file  Intimate Partner Violence: Not on file    FAMILY HISTORY: Family History  Problem Relation Age of Onset  . Hypertension Mother   . Heart failure Mother   . Hypertension Brother   . Stroke Brother   . Hypertension Son   . Emphysema Maternal Aunt   . Hypertension Paternal Aunt   . Hypertension Paternal Uncle   . Hypertension Maternal Grandmother   . Hypertension Paternal Grandmother     ALLERGIES:  is allergic to aspirin, belladonna alkaloids, fluoxetine hcl, naproxen, phenobarbital, and prozac [fluoxetine hcl].  MEDICATIONS:  Current Outpatient Medications  Medication Sig Dispense Refill  . acyclovir (ZOVIRAX) 400 MG tablet Take 1 tablet (400 mg total) by mouth 2 (two) times daily. 60 tablet 0  . amiodarone (PACERONE) 200 MG tablet Take 1 tablet (200 mg total) by mouth daily. 90 tablet 1  . amoxicillin-clavulanate (AUGMENTIN) 875-125 MG tablet Take 1 tablet by mouth 2 (two) times daily. 20 tablet 0  . apixaban (ELIQUIS) 5 MG TABS tablet Take 1 tablet (5 mg total) by mouth 2 (two) times daily. 60 tablet 0  . chlorhexidine (PERIDEX) 0.12 % solution Use as directed 10 mLs in the mouth or throat 2 (two) times daily. Swish and spit 473 mL 1  . dexamethasone (DECADRON) 4 MG tablet Take 1 tablet (4 mg total) by mouth 2 (two) times daily. For 3 days (Patient not taking: No sig reported) 6 tablet 0  . docusate sodium (COLACE) 100 MG capsule Take 1 capsule (100 mg total) by mouth daily. 30 capsule 1  . feeding supplement, ENSURE ENLIVE, (ENSURE ENLIVE) LIQD Take 237 mLs by mouth 3 (three) times  daily between meals. 237 mL 12  . fentaNYL (DURAGESIC) 25 MCG/HR Place 1 patch onto the skin every 3 (three) days. 10 patch 0  . furosemide (LASIX) 20 MG tablet Take 1 tablet (20 mg total) by mouth daily as needed (for lower extremity swelling). 90 tablet 1  . gabapentin (NEURONTIN) 300 MG capsule Take 1 capsule (300 mg total) by mouth 3 (three) times daily. 90 capsule 0  . lidocaine-prilocaine (EMLA) cream Apply 1 application topically as needed [facility]). Apply to affected area once 30 g 2  . lisinopril (ZESTRIL) 20 MG tablet Take 1 tablet (20 mg total) by mouth daily.    Marland Kitchen  magnesium chloride (SLOW-MAG) 64 MG TBEC SR tablet Take 1 tablet (64 mg total) by mouth 2 (two) times daily. 60 tablet 1  . Melatonin 10 MG TABS Take 10 mg by mouth at bedtime as needed (sleep).  (Patient not taking: No sig reported)    . Multiple Vitamins-Minerals (CENTRUM SILVER PO) Take 1 tablet by mouth daily. Gummie (Patient not taking: No sig reported)    . nicotine (NICODERM CQ - DOSED IN MG/24 HOURS) 21 mg/24hr patch Place 1 patch (21 mg total) onto the skin daily. (Patient not taking: No sig reported) 28 patch 0  . ondansetron (ZOFRAN-ODT) 8 MG disintegrating tablet Take 1 tablet (8 mg total) by mouth every 8 (eight) hours as needed for nausea or vomiting. 90 tablet 0  . oxyCODONE-acetaminophen (PERCOCET) 5-325 MG tablet Take 2 tablets by mouth every 12 (twelve) hours as needed for severe pain. 60 tablet 0  . polyethylene glycol powder (GLYCOLAX/MIRALAX) 17 GM/SCOOP powder Take 17 g by mouth daily as needed for mild constipation or moderate constipation. 255 g 0  . potassium chloride 20 MEQ/15ML (10%) SOLN Take 30 mLs (40 mEq total) by mouth daily. [employer] to pay for medication 900 mL 0  . prochlorperazine (COMPAZINE) 10 MG tablet Take 1 tablet (10 mg total) by mouth every 6 (six) hours as needed (Nausea or vomiting). 30 tablet 1   No current facility-administered medications for this visit.   Facility-Administered  Medications Ordered in Other Visits  Medication Dose Route Frequency Provider Last Rate Last Admin  . heparin lock flush 100 unit/mL  500 Units Intravenous Once Earlie Server, MD      . sodium chloride flush (NS) 0.9 % injection 10 mL  10 mL Intravenous PRN Earlie Server, MD   10 mL at 10/10/20 1040  . sodium chloride flush (NS) 0.9 % injection 10 mL  10 mL Intravenous PRN Earlie Server, MD   10 mL at 11/13/20 0836  . sodium chloride flush (NS) 0.9 % injection 10 mL  10 mL Intravenous PRN Earlie Server, MD         PHYSICAL EXAMINATION: ECOG PERFORMANCE STATUS: 1 - Symptomatic but completely ambulatory There were no vitals filed for this visit. There were no vitals filed for this visit.  Physical Exam Constitutional:      General: She is not in acute distress.    Comments: Thin built.  HENT:     Head: Normocephalic and atraumatic.     Mouth/Throat:     Comments: No thrush.  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 Cardiovascular:     Rate and Rhythm: Normal rate and regular rhythm.     Heart sounds: Normal heart sounds.  Pulmonary:     Effort: Pulmonary effort is normal. No respiratory distress.     Breath sounds: No wheezing.  Abdominal:     General: Bowel sounds are normal. There is no distension.     Palpations: Abdomen is soft.  Musculoskeletal:        General: No deformity. Normal range of motion.     Cervical back: Normal range of motion and neck supple.  Skin:    General: Skin is warm and dry.     Findings: No erythema or rash.  Neurological:     Mental Status: She is alert and oriented to person, place, and time. Mental status is at baseline.  Cranial Nerves: No cranial nerve deficit.     Coordination: Coordination normal.  Psychiatric:        Mood and Affect: Mood normal.     LABORATORY DATA:  I have reviewed the data as listed Lab Results  Component Value Date   WBC 4.3  10/29/2020   HGB 10.2 (L) 10/29/2020   HCT 29.8 (L) 10/29/2020   MCV 101.4 (H) 10/29/2020   PLT 225 10/29/2020   Recent Labs    07/10/20 1311 07/12/20 1204 08/08/20 0821 10/22/20 0933 10/24/20 0915 10/29/20 1042 11/06/20 0803 11/13/20 0836  NA 136 137   < > 133* 132* 132* 130* 130*  K 2.9* 4.0   < > 2.9* 3.1* 3.4* 3.7 3.6  CL 97* 98   < > 95* 96* 96* 96* 96*  CO2 28 27   < > 27 27 28 26 22   GLUCOSE 100* 114*   < > 94 79 94 72 137*  BUN 13 8   < > 12 7* 7* 14 16  CREATININE 0.43* 0.42*   < > 0.48 0.46 0.59 0.49 0.47  CALCIUM 8.9 9.0   < > 8.5* 8.4* 8.8* 8.2* 8.7*  GFRNONAA >60 >60   < > >60 >60 >60 >60 >60  GFRAA >60 >60  --   --   --   --   --   --   PROT 7.0  --    < > 6.0* 5.8* 6.5  --   --   ALBUMIN 3.7  --    < > 3.4* 3.2* 3.6  --   --   AST 20  --    < > 17 16 19   --   --   ALT 16  --    < > 11 10 9   --   --   ALKPHOS 106  --    < > 71 72 73  --   --   BILITOT 0.5  --    < > 0.6 0.9 0.6  --   --    < > = values in this interval not displayed.   Iron/TIBC/Ferritin/ %Sat    Component Value Date/Time   IRON 46 09/09/2020 0938   IRON 37 05/03/2018 1826   TIBC 210 (L) 09/09/2020 0938   FERRITIN 136 09/09/2020 0938   FERRITIN 52 05/03/2018 1826   IRONPCTSAT 22 09/09/2020 0938      RADIOGRAPHIC STUDIES: I have personally reviewed the radiological images as listed and agreed with the findings in the report. No results found.    ASSESSMENT & PLAN:  1. 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[place])   2. History of atrial fibrillation   3. Hypokalemia   4. Hypomagnesemia   5. 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  Status post concurrent chemotherapy and radiation.  Clinically she has responded very well to the treatments. Plan to repeat PET scan 12 weeks after she finishes treatments-beginning of March 2021.  Right facial pain /swelling has gotten better on antibiotics, worse again after antibiotics finished. I extend Augmentin for  another 5 days.  Refill was sent to pharmacy. I will obtain CT maxillary chest for further evaluation.  With patient has appointment with ENT next week for direct visualization.  Chemotherapy-induced mucositis.  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 Recommend patient to utilize Magic mouthwash as needed.  #  hypokalemia, potassium is stable.  Continue oral potassium to 6meq daily  #  Hypomagnesia, continue oral magnesium supplementation.  Magnesium is normal today. #Dysphagia and mild hyponatremia.  Patient will receive IV fluid hydration today.  Normal saline x1. Marland Kitchen  #Atrial fibrillation.  Continue amiodarone and metoprolol.  And Eliquis   ##neoplasm induced pain, continue fentanyl patch 65mcg/h Q72 hours.  Percocet 5/325 2 tablets every 12 hours as needed Continue garbapentine 300mg  TID.   All questions were answered. The patient knows to call the clinic with any problems questions or concerns.   Return of visit: Weekly BMP and magnesium +/- IV fluid/potassium/magnesium. Follow-up to be determined pending CT scan results.  Earlie Server, MD, PhD Hematology Oncology [facility] Pager- [phone] 11/19/2020

## 2020-11-19 NOTE — Progress Notes (Signed)
Finished a round of antibiotics for sinus infection.Now right sided facial swelling and pain is returning. Eating 2-4 ensure per day with 3-4 regular meals per day. Eating lots of foods but not gaining any weight.

## 2020-11-24 NOTE — Progress Notes (Signed)
Cardiology Office Note  Date:  11/25/2020   ID:  Shannon Schroeder, DOB August 08, 1956, MRN [id number]  PCP:  Pcp, No   Chief Complaint  Patient presents with  . Other    2 month follow up. Meds reviewed verbally with patient.     HPI:  DAISJA Schroeder is a 65 y.o. 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  In follow-up today, discussed recent hospitalizations as detailed below She has had tremendous weight loss, feels her weight is now more stable though still low New sinus infection, right side of the face hurts, she is on amoxicillin, followed by ENT, has appointment tomorrow  Denies significant tachycardia palpitations  Discussed lab work with her Anemia stable   no longer afford Eliquis.  She has not yet heard back from submitted patient assistance paperwork.  Has not been on Eliquis several weeks  EKG personally reviewed by myself on todays visit Shows normal sinus rhythm rate 70 bpm nonspecific ST abnormality  Recent records reviewed on today's visit Shewas seen virtually by Dr. Fletcher Anon in 03/2019 for preoperative cardiac evaluation for colonoscopy.   Echo at that time showed an EF of 60-65%, normal LV diastolic function, normal RVSF with normal ventricular cavity size, PASP 32 mmHg, and mildly to moderately dilated left atrium.   Available stress test from [facility] in 05/2009 showed a small, mildly severe reversible defect involving the mid anteroseptal wall consistent with probable attenuation artifact and/or possible ischemia.  EF >65%.   She was diagnosed with maxillary sinus cancer in 07/2020 and has been undergoing chemoradiation therapy.   hospital in 08/2020,   new onset Afib with RVR of uncertain chronicity.   converted to sinus rhythm on diltiazem gtt   placed on metoprolol and Eliquis.   Echo during the admission showed an EF of 60-65%, no RWMA, mild LVH, Gr2DD, normal PASP, mild MR.  CTA chest was negative for PE.  No significant coronary artery calcium was noted on CTA of the chest.    hospital on 09/06/20  weakness and hypotension.  Afib with RVR upon arrival to the ED. multifocal PNA and treated with broad spectrum antibiotics. Cardizem gtt with improvement in her ventricular rates throughout her admission.  11/8,she developed SVT with rates into the 170s bpmwith noted drop in BP to the 61P systolic.  bolus of IV amiodarone and placed on an amiodarone gtt.   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  PSH:    Past Surgical History:  Procedure Laterality Date  . ABDOMINAL HYSTERECTOMY     partial  . BREAST SURGERY     biopsy  . CARDIAC CATHETERIZATION     X 2  . CARPAL TUNNEL RELEASE Right   . CARPECTOMY HAND Right   . CATARACT EXTRACTION W/PHACO Left 03/08/2018  Procedure: CATARACT EXTRACTION PHACO AND INTRAOCULAR LENS PLACEMENT (IOC);  Surgeon: Birder Robson, MD;  Location: [facility] ORS;  Service: Ophthalmology;  Laterality: Left;  [place] 00:55 AP% 13.5 CDE 7.52 Fluid pack lot # [id number]  . CATARACT EXTRACTION W/PHACO Right 04/05/2018   Procedure: CATARACT EXTRACTION PHACO AND INTRAOCULAR LENS PLACEMENT (IOC);  Surgeon: Birder Robson, MD;  Location: [facility] ORS;  Service:  Ophthalmology;  Laterality: Right;  [place] 00:36 AP% 15.9 CDE 5.72 Fluid pack lot # [id number]  . CEREBRAL ANEURYSM REPAIR    . CHOLECYSTECTOMY    . COLONOSCOPY WITH PROPOFOL N/A 04/26/2019   Procedure: COLONOSCOPY WITH PROPOFOL;  Surgeon: Jonathon Bellows, MD;  Location: [facility] ENDOSCOPY;  Service: Gastroenterology;  Laterality: N/A;  . PORTACATH PLACEMENT Left 08/05/2020   Procedure: INSERTION PORT-A-CATH;  Surgeon: Herbert Pun, MD;  Location: [facility] ORS;  Service: General;  Laterality: Left;  . TUBAL LIGATION      Current Outpatient Medications  Medication Sig Dispense Refill  . amiodarone (PACERONE) 200 MG tablet Take 1 tablet (200 mg total) by mouth daily. 90 tablet 1  . amoxicillin-clavulanate (AUGMENTIN) 875-125 MG tablet Take 1 tablet by mouth 2 (two) times daily. 10 tablet 0  . apixaban (ELIQUIS) 5 MG TABS tablet Take 1 tablet (5 mg total) by mouth 2 (two) times daily. 180 tablet 3  . docusate sodium (COLACE) 100 MG capsule Take 1 capsule (100 mg total) by mouth daily. 30 capsule 1  . feeding supplement, ENSURE ENLIVE, (ENSURE ENLIVE) LIQD Take 237 mLs by mouth 3 (three) times daily between meals. 237 mL 12  . fentaNYL (DURAGESIC) 25 MCG/HR Place 1 patch onto the skin every 3 (three) days. 10 patch 0  . furosemide (LASIX) 20 MG tablet Take 1 tablet (20 mg total) by mouth daily as needed (for lower extremity swelling). 90 tablet 1  . gabapentin (NEURONTIN) 300 MG capsule Take 1 capsule (300 mg total) by mouth 3 (three) times daily. 90 capsule 0  . lidocaine-prilocaine (EMLA) cream Apply 1 application topically as needed [facility]). Apply to affected area once 30 g 2  . magnesium chloride (SLOW-MAG) 64 MG TBEC SR tablet Take 1 tablet (64 mg total) by mouth 2 (two) times daily. 60 tablet 1  . Melatonin 10 MG TABS Take 10 mg by mouth at bedtime as needed (sleep).    . Multiple Vitamins-Minerals (CENTRUM SILVER PO) Take 1 tablet by mouth daily. Gummie    . ondansetron (ZOFRAN-ODT) 8 MG  disintegrating tablet Take 1 tablet (8 mg total) by mouth every 8 (eight) hours as needed for nausea or vomiting. 90 tablet 0  . oxyCODONE-acetaminophen (PERCOCET) 5-325 MG tablet Take 2 tablets by mouth every 12 (twelve) hours as needed for severe pain. 60 tablet 0  . polyethylene glycol powder (GLYCOLAX/MIRALAX) 17 GM/SCOOP powder Take 17 g by mouth daily as needed for mild constipation or moderate constipation. 255 g 0  . potassium chloride 20 MEQ/15ML (10%) SOLN Take 30 mLs (40 mEq total) by mouth daily. [employer] to pay for medication 900 mL 0  . prochlorperazine (COMPAZINE) 10 MG tablet Take 1 tablet (10 mg total) by mouth every 6 (six) hours as needed (Nausea or vomiting). 30 tablet 1   No current facility-administered medications for this visit.   Facility-Administered Medications Ordered in Other Visits  Medication Dose Route Frequency Provider Last Rate Last Admin  . sodium chloride flush (NS) 0.9 % injection 10 mL  10 mL Intravenous PRN Earlie Server, MD   10 mL at 10/10/20 1040  .  sodium chloride flush (NS) 0.9 % injection 10 mL  10 mL Intravenous PRN Earlie Server, MD   10 mL at 11/13/20 [id number]     Allergies:   Aspirin, Belladonna alkaloids, Fluoxetine hcl, Naproxen, Phenobarbital, and Prozac [fluoxetine hcl]   Social History:  The patient  reports that she has been smoking cigarettes. She has a 12.50 pack-year smoking history. She has never used smokeless tobacco. She reports that she does not drink alcohol and does not use drugs.   Family History:   family history includes Emphysema in her maternal aunt; Heart failure in her mother; Hypertension in her brother, maternal grandmother, mother, paternal aunt, paternal grandmother, paternal uncle, and son; Stroke in her brother.    Review of Systems: Review of Systems  Constitutional: Negative.   HENT: Negative.   Respiratory: Negative.   Cardiovascular: Negative.   Gastrointestinal: Negative.   Musculoskeletal: Negative.   Neurological:  Negative.   Psychiatric/Behavioral: Negative.   All other systems reviewed and are negative.   PHYSICAL EXAM: VS:  BP 132/84 (BP Location: Left Arm, Patient Position: Sitting, Cuff Size: Normal)   Pulse 70   Ht 5\' 6"  (1.676 m)   Wt 111 lb (50.3 kg)   LMP 11/19/1988 (Exact Date)   SpO2 96%   BMI 17.92 kg/m  , BMI Body mass index is 17.92 kg/m. GEN: Well nourished, well developed, in no acute distress HEENT: normal Neck: no JVD, carotid bruits, or masses Cardiac: RRR; no murmurs, rubs, or gallops,no edema  Respiratory:  clear to auscultation bilaterally, normal work of breathing GI: soft, nontender, nondistended, + BS MS: no deformity or atrophy Skin: warm and dry, no rash Neuro:  Strength and sensation are intact Psych: euthymic mood, full affect   Recent Labs: 08/08/2020: B Natriuretic Peptide 233.2 09/07/2020: TSH 3.199 10/29/2020: ALT 9 11/19/2020: BUN 18; Creatinine, Ser 0.39; Hemoglobin 10.8; Magnesium 1.8; Platelets 390; Potassium 4.0; Sodium 132    Lipid Panel Lab Results  Component Value Date   CHOL 129 08/09/2020   HDL 52 08/09/2020   LDLCALC 60 08/09/2020   TRIG 87 08/09/2020      Wt Readings from Last 3 Encounters:  11/25/20 111 lb (50.3 kg)  11/19/20 110 lb 4.8 oz (50 kg)  11/15/20 110 lb 3.2 oz (50 kg)      ASSESSMENT AND PLAN:  Problem List Items Addressed This Visit   None   Visit Diagnoses    PAF (paroxysmal atrial fibrillation) (HCC)    -  Primary   Relevant Medications   apixaban (ELIQUIS) 5 MG TABS tablet   Other Relevant Orders   EKG 12-Lead   Paroxysmal SVT (supraventricular tachycardia) (HCC)       Relevant Medications   apixaban (ELIQUIS) 5 MG TABS tablet   Other Relevant Orders   EKG 12-Lead   Demand ischemia (HCC)       Relevant Medications   apixaban (ELIQUIS) 5 MG TABS tablet   Essential hypertension       Relevant Medications   apixaban (ELIQUIS) 5 MG TABS tablet     Paroxysmal atrial fibrillation Maintaining normal  sinus rhythm, continue amiodarone Preserving not on other rate controlling agents secondary to hypotension in the setting of protein calorie malnutrition Will look into her Eliquis company assistance, will likely need to be resubmitted as it is a new year Unfortunately no samples available on today's visit of Eliquis  Essential hypertension Blood pressure is well controlled on today's visit. No changes made to the medications.  Paroxysmal  SVT Denies having any breakthrough episodes On amiodarone  Severe protein calorie malnutrition Weight stable, slow trend upwards, encouraged her to continue 3 meals a day    Total encounter time more than 25 minutes  Greater than 50% was spent in counseling and coordination of care with the patient    Signed, Esmond Plants, M.D., Ph.D. [place], [place]

## 2020-11-25 ENCOUNTER — Other Ambulatory Visit: Payer: Self-pay

## 2020-11-25 ENCOUNTER — Ambulatory Visit (INDEPENDENT_AMBULATORY_CARE_PROVIDER_SITE_OTHER): Payer: Medicare Other | Admitting: Cardiovascular Disease

## 2020-11-25 ENCOUNTER — Telehealth: Payer: Self-pay | Admitting: Cardiovascular Disease

## 2020-11-25 ENCOUNTER — Encounter: Payer: Self-pay | Admitting: Cardiovascular Disease

## 2020-11-25 VITALS — BP 132/84 | HR 70 | Ht 66.0 in | Wt 111.0 lb

## 2020-11-25 DIAGNOSIS — I471 Supraventricular tachycardia: Secondary | ICD-10-CM | POA: Diagnosis not present

## 2020-11-25 DIAGNOSIS — I10 Essential (primary) hypertension: Secondary | ICD-10-CM | POA: Diagnosis not present

## 2020-11-25 DIAGNOSIS — I248 Other forms of acute ischemic heart disease: Secondary | ICD-10-CM | POA: Diagnosis not present

## 2020-11-25 DIAGNOSIS — I480 Paroxysmal atrial fibrillation: Secondary | ICD-10-CM

## 2020-11-25 MED ORDER — APIXABAN 5 MG PO TABS: 5.0000 mg | ORAL_TABLET | Freq: Two times a day (BID) | ORAL | 3 refills | Status: DC

## 2020-11-25 NOTE — Telephone Encounter (Signed)
error 

## 2020-11-25 NOTE — Patient Instructions (Addendum)
Medication Instructions:  Please start Eliquis 5mg  two times a day  We will contact pharmacy to determine if we can renew the patient assistance for 2022    Lab work: No new labs needed  Testing/Procedures: No new testing needed   Follow-Up: At [facility], you and your health needs are our priority.  As part of our continuing mission to provide you with exceptional heart care, we have created designated Provider Care Teams.  These Care Teams include your primary Cardiologist (physician) and Advanced Practice Providers (APPs -  Physician Assistants and Nurse Practitioners) who all work together to provide you with the care you need, when you need it.  . You will need a follow up appointment in 6 months  . Providers on your designated Care Team:   . Murray Hodgkins, NP . Christell Faith, PA-C . Marrianne Mood, PA-C  Any Other Special Instructions Will Be Listed Below (If Applicable).  COVID-19 Vaccine Information can be found at: [email or web address].uk For questions related to vaccine distribution or appointments, please email [email or web address] or call [phone].

## 2020-11-26 ENCOUNTER — Other Ambulatory Visit: Payer: Medicare Other

## 2020-11-26 ENCOUNTER — Ambulatory Visit: Payer: Medicare Other

## 2020-11-27 ENCOUNTER — Inpatient Hospital Stay: Payer: Medicare Other

## 2020-11-27 ENCOUNTER — Other Ambulatory Visit: Payer: Self-pay | Admitting: *Deleted

## 2020-11-27 ENCOUNTER — Telehealth: Payer: Self-pay | Admitting: Oncology

## 2020-11-27 DIAGNOSIS — C310 Malignant neoplasm of maxillary sinus: Secondary | ICD-10-CM

## 2020-11-27 DIAGNOSIS — E876 Hypokalemia: Secondary | ICD-10-CM

## 2020-11-27 LAB — BASIC METABOLIC PANEL
Anion gap: 11 (ref 5–15)
BUN: 18 mg/dL (ref 8–23)
CO2: 23 mmol/L (ref 22–32)
Calcium: 8.9 mg/dL (ref 8.9–10.3)
Chloride: 96 mmol/L — ABNORMAL LOW (ref 98–111)
Creatinine, Ser: 0.57 mg/dL (ref 0.44–1.00)
GFR, Estimated: >60 mL/min (ref >60)
Glucose, Bld: 97 mg/dL (ref 70–99)
Potassium: 3.2 mmol/L — ABNORMAL LOW (ref 3.5–5.1)
Sodium: 130 mmol/L — ABNORMAL LOW (ref 135–145)

## 2020-11-27 LAB — MAGNESIUM: Magnesium: 1.9 mg/dL (ref 1.7–2.4)

## 2020-11-27 MED ORDER — HEPARIN SOD (PORK) LOCK FLUSH 100 UNIT/ML IV SOLN
500.0000 [IU] | Freq: Once | INTRAVENOUS | Status: AC
  Administered 2020-11-27: 500 [IU] via INTRAVENOUS
  Filled 2020-11-27: qty 5

## 2020-11-27 MED ORDER — GABAPENTIN 300 MG PO CAPS: 300.0000 mg | ORAL_CAPSULE | Freq: Three times a day (TID) | ORAL | 0 refills | Status: DC

## 2020-11-27 MED ORDER — SODIUM CHLORIDE 0.9% FLUSH
10.0000 mL | INTRAVENOUS | Status: DC | PRN
  Administered 2020-11-27: 10 mL via INTRAVENOUS
  Filled 2020-11-27: qty 10

## 2020-11-27 MED ORDER — POTASSIUM CHLORIDE IN NACL 20-0.9 MEQ/L-% IV SOLN
INTRAVENOUS | Status: DC
  Filled 2020-11-27 (×2): qty 1000

## 2020-11-27 NOTE — Telephone Encounter (Signed)
Pt states she needs refill on gabapentin today.

## 2020-11-27 NOTE — Progress Notes (Signed)
Pt saw ENT. The facial pain and swelling is not infection, it is nerve inflammation.ENT is supposed to discuss pain issues with Dr Tasia Catchings. She is being scheduled for a tube insertion in right ear once insurance authorizes. Requesting a refill of gabapentin. She received IVF and potassium today. Ambulatory at discharge. Pt has no follow appts. I sent message to Team for appt.

## 2020-11-27 NOTE — Telephone Encounter (Signed)
Done... Pt was made aware of her sched 12/04/20 lab/K+/MAG appt per MD

## 2020-11-27 NOTE — Telephone Encounter (Signed)
Pt left VM for return call, pt stated she was speaking to [facility] and would appreciate if she could call her back.

## 2020-11-28 ENCOUNTER — Inpatient Hospital Stay: Payer: Medicare Other

## 2020-11-28 NOTE — Progress Notes (Signed)
Nutrition Follow-up:   Patient with squamous cell carcinoma of right maxilla infiltrates right buccal floor of nasal cavity, right paetergoid palatin foxxa, the right orbital apex and right soft palate and palatine tonsil.  Patient completed chemotherapy on 12/6 and radiation on 12/10  Spoke with patient via phone for follow-up.  Patient reports decrease appetite due to pain (cheek and eye) and nausea.  Reports that she has been seen by ENT and pain is coming from nerve inflammation.  Planning tube insertion in right ear once insurance authorizes. Patient drinking ensure shakes and eating soups.      Medications: reviewed  Labs: reviewed  Anthropometrics:   Weight 111 lb 1/24  12/28 115 lb 12/13 105 lb   NUTRITION DIAGNOSIS: Inadequate oral intake continues   INTERVENTION:  Patient has declined feeding tube.  Will leave complimentary case of ensure plus at front desk for pick up on 1/28 Patient to increase calories and protein to prevent weight loss    MONITORING, EVALUATION, GOAL: weight trends, intake   NEXT VISIT: Feb 24 th phone f/u  Shannon Schroeder, [place], [place] Registered Dietitian [phone] (mobile)

## 2020-12-03 ENCOUNTER — Other Ambulatory Visit: Payer: Self-pay

## 2020-12-03 ENCOUNTER — Ambulatory Visit
Admission: RE | Admit: 2020-12-03 | Discharge: 2020-12-03 | Disposition: A | Discharge status: Home / Self Care | Payer: Medicare Other | Source: Ambulatory Visit | Attending: Oncology | Admitting: Oncology

## 2020-12-03 DIAGNOSIS — C310 Malignant neoplasm of maxillary sinus: Secondary | ICD-10-CM | POA: Insufficient documentation

## 2020-12-03 IMAGING — CT CT MAXILLOFACIAL W/ CM
3 series · 14 of 47 positions shown, 16 images · IV contrast (omnipaque)
Comparison: [DATE]

CLINICAL DATA: Follow-up sinus cancer.

EXAM:
CT MAXILLOFACIAL WITH CONTRAST
TECHNIQUE: Multidetector CT imaging of the maxillofacial structures was
performed with intravenous contrast. Multiplanar CT image
reconstructions were also generated.
CONTRAST:  75mL OMNIPAQUE IOHEXOL 300 MG/ML  SOLN

[Series 2: max soft face 2.00 ax · axial · 0.27mm/px · z∈[-616,-468]mm · 8 of 86 slices shown, 10 images]
[im 6/86  brain]
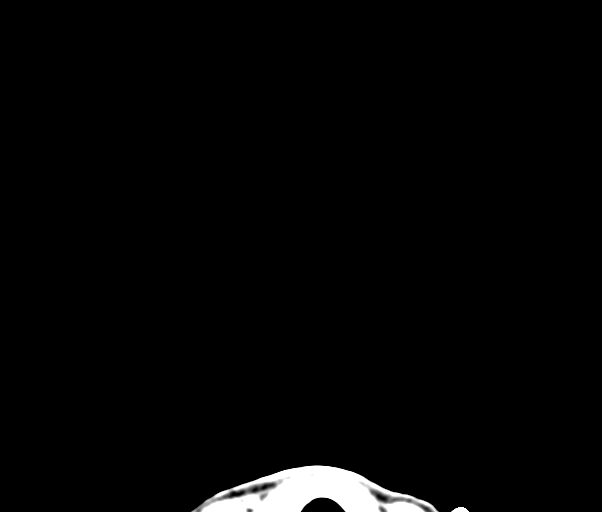
[im 6/86  bone]
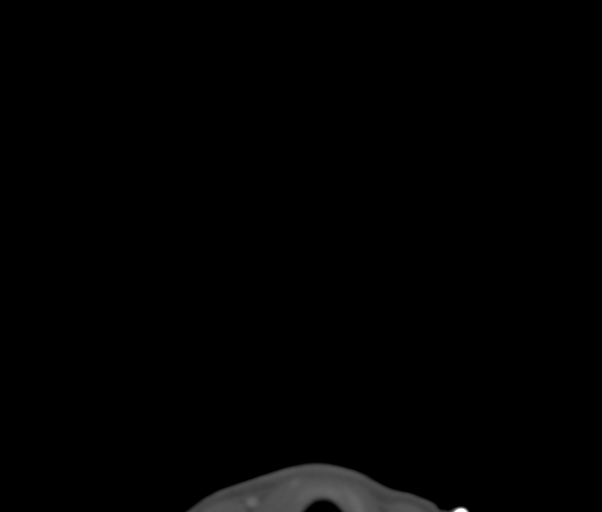
[im 18/86  bone]
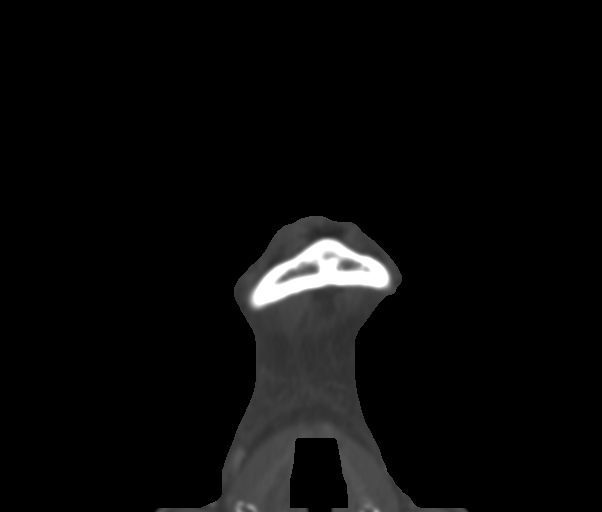
[im 27/86  bone]
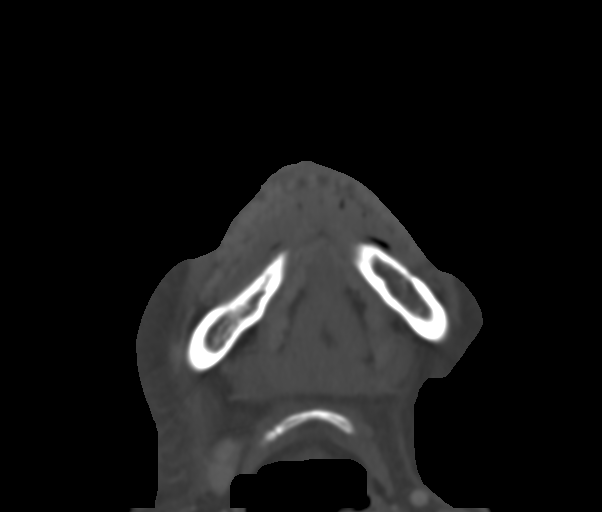
[im 39/86  bone]
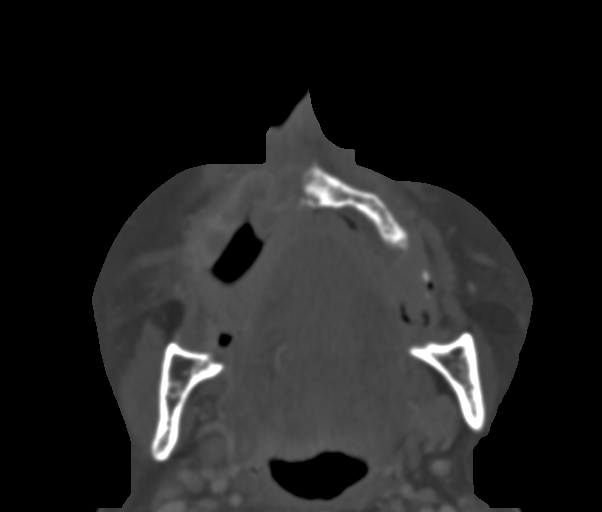
[im 47/86  brain]
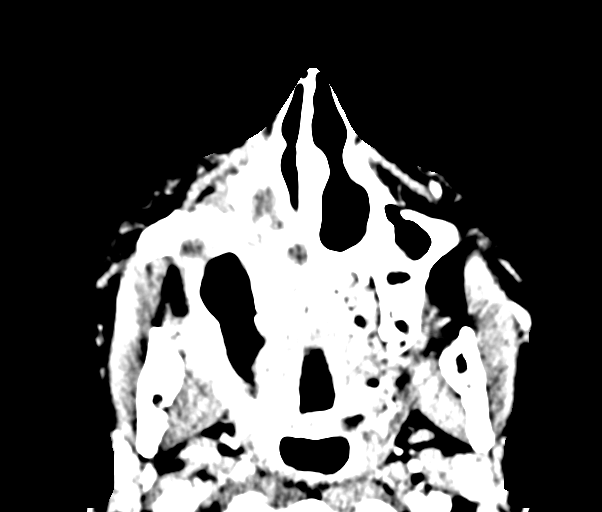
[im 47/86  bone]
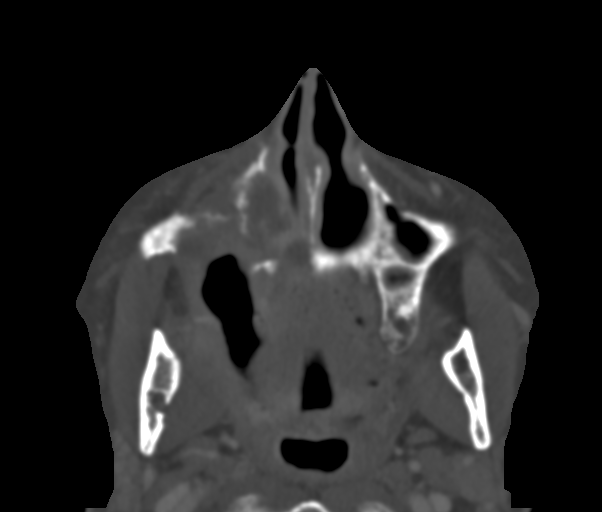
[im 59/86  bone]
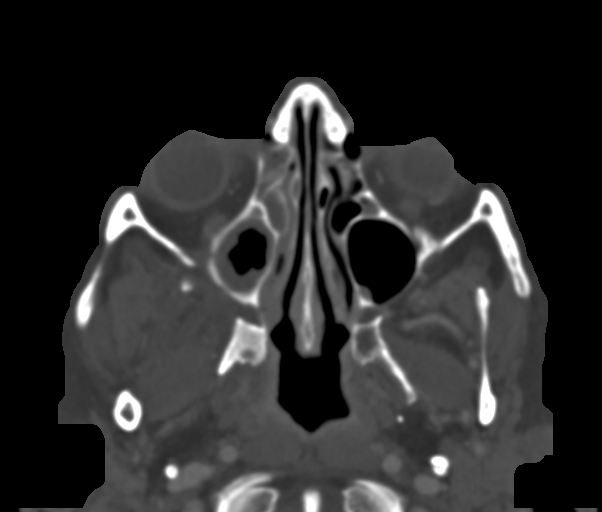
[im 68/86  bone]
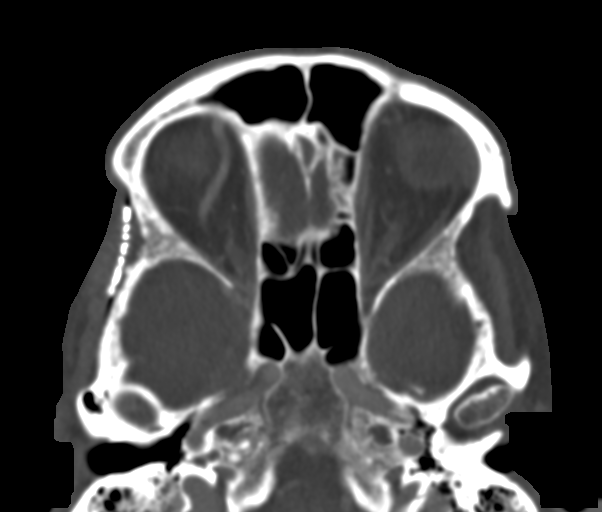
[im 80/86  bone]
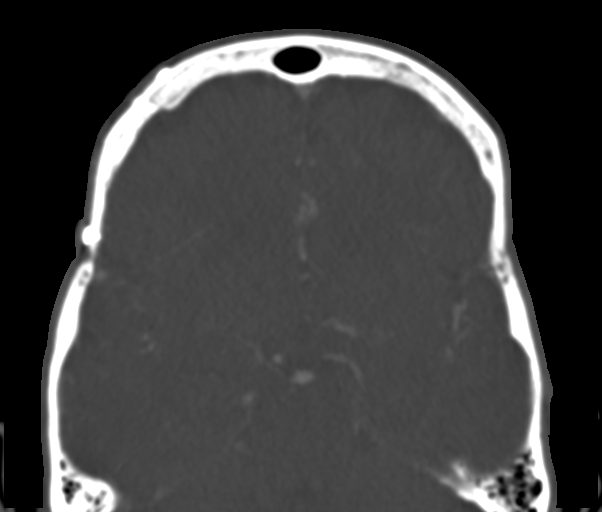

[Series 8: coronal soft face 2.00 cor · coronal · 0.32mm/px · 3 of 76 slices shown]
[im 26/76  bone]
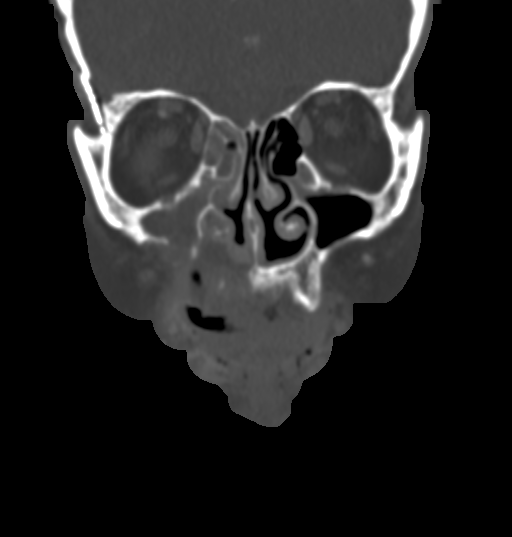
[im 34/76  bone]
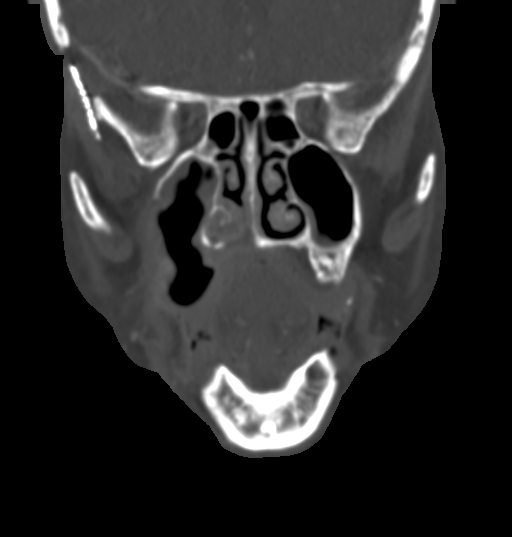
[im 42/76  bone]
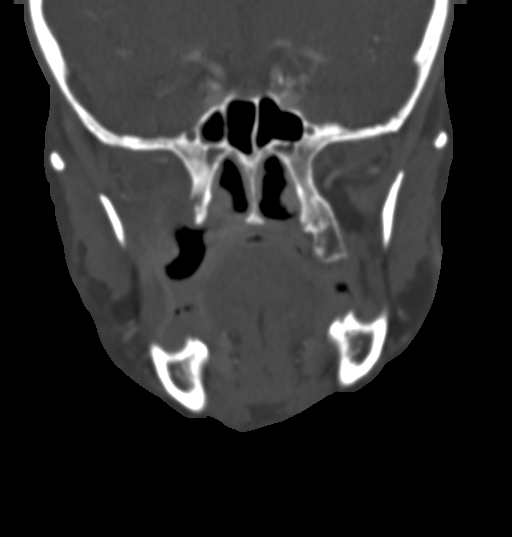

[Series 12: sagittal soft face 2.00 sag · sagittal · 0.32mm/px · 3 of 86 slices shown]
[im 29/86  bone]
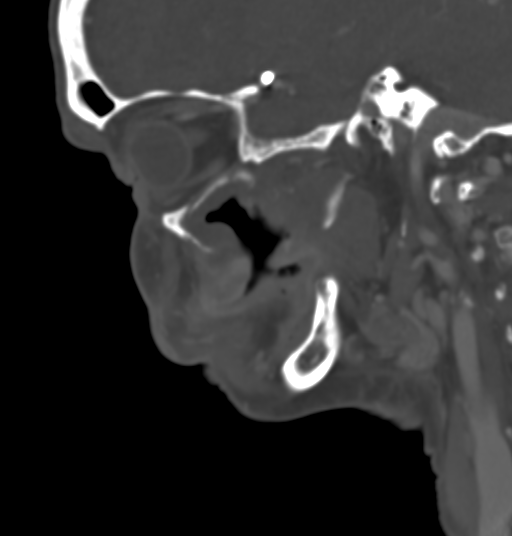
[im 43/86  bone]
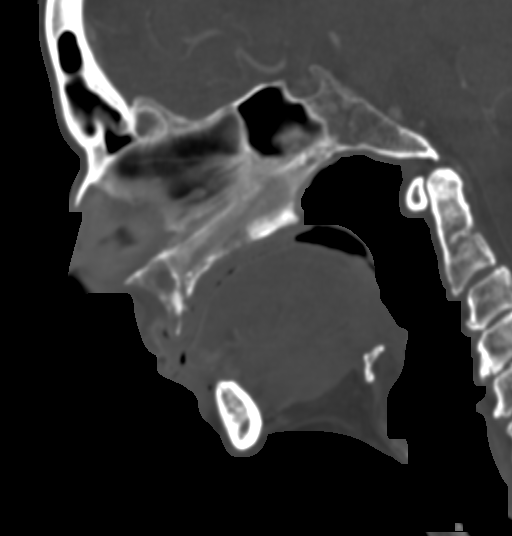
[im 57/86  bone]
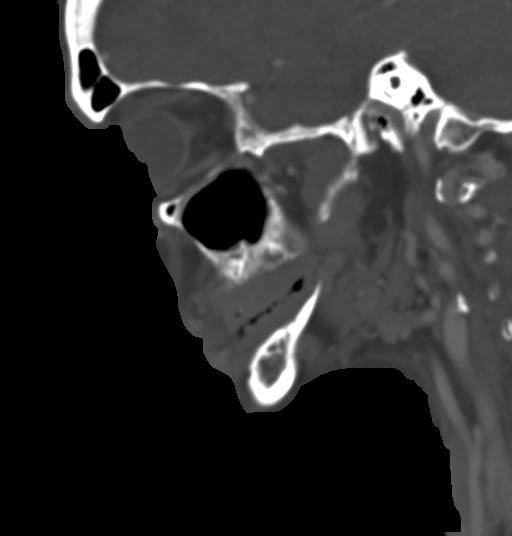

[14 of 47 positions shown; findings below may reference images not displayed]

FINDINGS: Extensive right maxillary sinus squamous cell carcinoma with
extension beyond the right maxillary sinus into the palate, oral
cavity, right pterygopalatine fossa/infratemporal fossa, and right
Meckel's cave. The thickness of the irregularly-shaped tumor (which
limits reproducible measurement) has improved but there is still
enhancing peripheral and central likely necrotic material especially
seen along the anteroinferior tumor bed, with progressive erosion in
the anterior palate - further undermining the nasal spine. There is
still asymmetric enhancement at the right Meckel's cave without
visible progression into the prepontine cistern or brainstem.
Indistinct material is seen at the right pterygoid musculature and
pterygoid palatine fossa, suspect viable tumor in this location is
well.

There is history of facial pain which response antibiotics. Since
prior there is low-density opacification of right ethmoid air cells
which is likely obstructive. Partial bilateral mastoid opacification
which is likely treatment related. No visible involvement of the
nasopharynx.

Prior right pterional craniotomy for aneurysm clipping.

No acute fracture.  No visible orbital tumor.
IMPRESSION: 1. Right maxillary sinus squamous cell carcinoma with extensive
extra-sinus spread. There is been positive treatment response with
decreased bulk of the tumor, although there is still evidence of
viable tumor especially along the anteroinferior tumor bed,
infratemporal fossa, and right Meckel's cave. The greatest tumoral
enhancement is at the level of the midline maxilla where there has
been progressive bony erosion.
2. Interval right ethmoid sinusitis which may relate to the
antibiotic responsive pain.

## 2020-12-03 MED ORDER — IOHEXOL 300 MG/ML  SOLN
75.0000 mL | Freq: Once | INTRAMUSCULAR | Status: AC | PRN
  Administered 2020-12-03: 75 mL via INTRAVENOUS

## 2020-12-04 ENCOUNTER — Other Ambulatory Visit: Payer: Self-pay | Admitting: *Deleted

## 2020-12-04 ENCOUNTER — Inpatient Hospital Stay: Payer: Medicare Other | Attending: Oncology

## 2020-12-04 ENCOUNTER — Inpatient Hospital Stay: Payer: Medicare Other

## 2020-12-04 VITALS — BP 157/88 | HR 64 | Temp 97.4°F | Resp 20 | Wt 105.6 lb

## 2020-12-04 DIAGNOSIS — R062 Wheezing: Secondary | ICD-10-CM | POA: Insufficient documentation

## 2020-12-04 DIAGNOSIS — C310 Malignant neoplasm of maxillary sinus: Secondary | ICD-10-CM | POA: Diagnosis present

## 2020-12-04 DIAGNOSIS — R000 Tachycardia, unspecified: Secondary | ICD-10-CM | POA: Diagnosis not present

## 2020-12-04 DIAGNOSIS — Z79899 Other long term (current) drug therapy: Secondary | ICD-10-CM | POA: Insufficient documentation

## 2020-12-04 DIAGNOSIS — I4891 Unspecified atrial fibrillation: Secondary | ICD-10-CM | POA: Diagnosis not present

## 2020-12-04 DIAGNOSIS — Z79891 Long term (current) use of opiate analgesic: Secondary | ICD-10-CM | POA: Diagnosis not present

## 2020-12-04 DIAGNOSIS — G893 Neoplasm related pain (acute) (chronic): Secondary | ICD-10-CM | POA: Insufficient documentation

## 2020-12-04 DIAGNOSIS — I10 Essential (primary) hypertension: Secondary | ICD-10-CM | POA: Diagnosis not present

## 2020-12-04 DIAGNOSIS — R634 Abnormal weight loss: Secondary | ICD-10-CM | POA: Insufficient documentation

## 2020-12-04 DIAGNOSIS — E860 Dehydration: Secondary | ICD-10-CM | POA: Insufficient documentation

## 2020-12-04 DIAGNOSIS — F17210 Nicotine dependence, cigarettes, uncomplicated: Secondary | ICD-10-CM | POA: Insufficient documentation

## 2020-12-04 DIAGNOSIS — J449 Chronic obstructive pulmonary disease, unspecified: Secondary | ICD-10-CM | POA: Insufficient documentation

## 2020-12-04 DIAGNOSIS — E876 Hypokalemia: Secondary | ICD-10-CM | POA: Diagnosis not present

## 2020-12-04 DIAGNOSIS — E871 Hypo-osmolality and hyponatremia: Secondary | ICD-10-CM

## 2020-12-04 DIAGNOSIS — I700 Atherosclerosis of aorta: Secondary | ICD-10-CM | POA: Insufficient documentation

## 2020-12-04 DIAGNOSIS — F319 Bipolar disorder, unspecified: Secondary | ICD-10-CM | POA: Diagnosis not present

## 2020-12-04 DIAGNOSIS — Z7901 Long term (current) use of anticoagulants: Secondary | ICD-10-CM | POA: Insufficient documentation

## 2020-12-04 DIAGNOSIS — Z8679 Personal history of other diseases of the circulatory system: Secondary | ICD-10-CM

## 2020-12-04 LAB — BASIC METABOLIC PANEL
Anion gap: 12 (ref 5–15)
BUN: 10 mg/dL (ref 8–23)
CO2: 27 mmol/L (ref 22–32)
Calcium: 9.1 mg/dL (ref 8.9–10.3)
Chloride: 94 mmol/L — ABNORMAL LOW (ref 98–111)
Creatinine, Ser: 0.41 mg/dL — ABNORMAL LOW (ref 0.44–1.00)
GFR, Estimated: >60 mL/min (ref >60)
Glucose, Bld: 98 mg/dL (ref 70–99)
Potassium: 3.3 mmol/L — ABNORMAL LOW (ref 3.5–5.1)
Sodium: 133 mmol/L — ABNORMAL LOW (ref 135–145)

## 2020-12-04 LAB — MAGNESIUM: Magnesium: 2 mg/dL (ref 1.7–2.4)

## 2020-12-04 MED ORDER — SODIUM CHLORIDE 0.9% FLUSH
10.0000 mL | Freq: Once | INTRAVENOUS | Status: AC | PRN
  Administered 2020-12-04: 10 mL
  Filled 2020-12-04: qty 10

## 2020-12-04 MED ORDER — HEPARIN SOD (PORK) LOCK FLUSH 100 UNIT/ML IV SOLN
500.0000 [IU] | Freq: Once | INTRAVENOUS | Status: AC | PRN
Start: 2020-12-04 — End: 2020-12-04
  Administered 2020-12-04: 500 [IU]
  Filled 2020-12-04: qty 5

## 2020-12-04 MED ORDER — OXYCODONE-ACETAMINOPHEN 5-325 MG PO TABS: 2.0000 | ORAL_TABLET | Freq: Two times a day (BID) | ORAL | 0 refills | Status: DC | PRN

## 2020-12-04 MED ORDER — SODIUM CHLORIDE 0.9 % IV SOLN
Freq: Once | INTRAVENOUS | Status: AC
  Filled 2020-12-04: qty 250

## 2020-12-04 MED ORDER — POTASSIUM CHLORIDE 20 MEQ/100ML IV SOLN
20.0000 meq | Freq: Once | INTRAVENOUS | Status: AC
  Administered 2020-12-04: 20 meq via INTRAVENOUS

## 2020-12-04 NOTE — Progress Notes (Signed)
Patient had a tube placed in her right ear by ENT on Monday. Since then her pain and pressure in her face and eye has reduced. Appetite has decreased this past week.Pt had IV hydration w/ potassium. Requesting a refill on percocet which I sent to [facility]. Discharged without complaints.

## 2020-12-09 ENCOUNTER — Telehealth: Payer: Self-pay

## 2020-12-09 DIAGNOSIS — C310 Malignant neoplasm of maxillary sinus: Secondary | ICD-10-CM

## 2020-12-09 NOTE — Telephone Encounter (Signed)
Done. Pt is aware.

## 2020-12-09 NOTE — Telephone Encounter (Signed)
-----   Message from Earlie Server, MD sent at 12/09/2020 10:44 AM EST ----- Please arrange her to get PET restaging around 01/09/21. Reason is maxillary cancer. Also schedule her for lab cbc cmp mag md  week of 2/14 IV IVF/K/Mag

## 2020-12-09 NOTE — Telephone Encounter (Signed)
Orders entered.  Please schedule as MD recommends and inform patient of appt details.

## 2020-12-11 ENCOUNTER — Inpatient Hospital Stay: Payer: Medicare Other

## 2020-12-11 VITALS — BP 175/88 | HR 67 | Temp 97.8°F | Resp 18

## 2020-12-11 DIAGNOSIS — Z8679 Personal history of other diseases of the circulatory system: Secondary | ICD-10-CM

## 2020-12-11 DIAGNOSIS — E871 Hypo-osmolality and hyponatremia: Secondary | ICD-10-CM

## 2020-12-11 DIAGNOSIS — C310 Malignant neoplasm of maxillary sinus: Secondary | ICD-10-CM

## 2020-12-11 DIAGNOSIS — E876 Hypokalemia: Secondary | ICD-10-CM

## 2020-12-11 DIAGNOSIS — G893 Neoplasm related pain (acute) (chronic): Secondary | ICD-10-CM

## 2020-12-11 LAB — BASIC METABOLIC PANEL
Anion gap: 9 (ref 5–15)
BUN: 12 mg/dL (ref 8–23)
CO2: 25 mmol/L (ref 22–32)
Calcium: 9 mg/dL (ref 8.9–10.3)
Chloride: 100 mmol/L (ref 98–111)
Creatinine, Ser: 0.42 mg/dL — ABNORMAL LOW (ref 0.44–1.00)
GFR, Estimated: >60 mL/min (ref >60)
Glucose, Bld: 93 mg/dL (ref 70–99)
Potassium: 4 mmol/L (ref 3.5–5.1)
Sodium: 134 mmol/L — ABNORMAL LOW (ref 135–145)

## 2020-12-11 LAB — MAGNESIUM: Magnesium: 1.9 mg/dL (ref 1.7–2.4)

## 2020-12-11 MED ORDER — SODIUM CHLORIDE 0.9% FLUSH
10.0000 mL | Freq: Once | INTRAVENOUS | Status: AC | PRN
  Administered 2020-12-11: 10 mL
  Filled 2020-12-11: qty 10

## 2020-12-11 MED ORDER — HEPARIN SOD (PORK) LOCK FLUSH 100 UNIT/ML IV SOLN
500.0000 [IU] | Freq: Once | INTRAVENOUS | Status: AC | PRN
  Administered 2020-12-11: 500 [IU]
  Filled 2020-12-11: qty 5

## 2020-12-11 MED ORDER — SODIUM CHLORIDE 0.9 % IV SOLN
Freq: Once | INTRAVENOUS | Status: AC
  Filled 2020-12-11: qty 250

## 2020-12-11 NOTE — Progress Notes (Signed)
Pt received IV hydration today. Right facial swelling more prominent today. Pt is continuing to eat soft moist foods. BP elevated this am as pt is stressed financially; she is trying to do her taxes sitting in recliner. No complaints at time of discharge. Ambulatory.

## 2020-12-12 ENCOUNTER — Other Ambulatory Visit: Payer: Self-pay | Admitting: *Deleted

## 2020-12-12 ENCOUNTER — Telehealth: Payer: Self-pay

## 2020-12-12 MED ORDER — FENTANYL 25 MCG/HR TD PT72: 1.0000 | MEDICATED_PATCH | TRANSDERMAL | 0 refills | Status: DC

## 2020-12-12 NOTE — Telephone Encounter (Signed)
Nutrition  Patient called requesting more ensure.  Complimentary case of ensure plus left at front desk for patient to pick up on 2/11.  Patient appreciative.  Kyleigh Nannini B. Zenia Resides, [place], [place] Registered Dietitian [phone] (mobile)

## 2020-12-16 ENCOUNTER — Ambulatory Visit: Payer: Medicare Other | Admitting: Oncology

## 2020-12-16 ENCOUNTER — Ambulatory Visit: Payer: Medicare Other

## 2020-12-16 ENCOUNTER — Other Ambulatory Visit: Payer: Medicare Other

## 2020-12-20 ENCOUNTER — Inpatient Hospital Stay: Payer: Medicare Other | Admitting: Oncology

## 2020-12-20 ENCOUNTER — Inpatient Hospital Stay: Payer: Medicare Other

## 2020-12-26 ENCOUNTER — Inpatient Hospital Stay: Payer: Medicare Other

## 2020-12-26 NOTE — Progress Notes (Signed)
Nutrition Follow-up:  Patient with head and neck cancer.  Completed treatment on 12/10.  Patient with recent tube placed in ear by ENT.  Spoke with patient via phone. Patient reports that appetite is getting better.  Drinking 3 ensure plus sometimes 4-5 per day.  Reports has been eating cream of wheat, soups, cube steak (cut fine) with gravy, rice, hamburger with noodles recently.      Medications: reviewed  Labs: reviewed  Anthropometrics:   Weight 105 lb on 2/2 decreased from 111 lb on 1/24   NUTRITION DIAGNOSIS:  Inadequate oral intake continues   INTERVENTION:  Declined feeding tube. Complimentary case of ensure plus left for patient to pick up on Monday 2/28 Discussed ways to add calories    MONITORING, EVALUATION, GOAL: weight trends, intake    NEXT VISIT: March 24 phone call  Joli B. Zenia Resides, [place], [place] Registered Dietitian [phone] (mobile)

## 2020-12-30 ENCOUNTER — Inpatient Hospital Stay (HOSPITAL_BASED_OUTPATIENT_CLINIC_OR_DEPARTMENT_OTHER): Payer: Medicare Other | Admitting: Oncology

## 2020-12-30 ENCOUNTER — Inpatient Hospital Stay: Payer: Medicare Other

## 2020-12-30 ENCOUNTER — Telehealth: Payer: Self-pay

## 2020-12-30 ENCOUNTER — Other Ambulatory Visit: Payer: Self-pay

## 2020-12-30 ENCOUNTER — Encounter: Payer: Self-pay | Admitting: Oncology

## 2020-12-30 ENCOUNTER — Other Ambulatory Visit: Payer: Self-pay | Admitting: *Deleted

## 2020-12-30 ENCOUNTER — Ambulatory Visit
Admission: RE | Admit: 2020-12-30 | Discharge: 2020-12-30 | Disposition: A | Discharge status: Home / Self Care | Payer: Medicare Other | Source: Ambulatory Visit | Attending: Oncology | Admitting: Oncology

## 2020-12-30 ENCOUNTER — Ambulatory Visit
Admission: RE | Admit: 2020-12-30 | Discharge: 2020-12-30 | Disposition: A | Discharge status: Home / Self Care | Payer: Medicare Other | Source: Home / Self Care | Attending: Oncology | Admitting: Oncology

## 2020-12-30 VITALS — BP 165/88 | HR 78 | Temp 96.7°F | Resp 18 | Wt 115.5 lb

## 2020-12-30 VITALS — BP 165/79 | HR 72 | Temp 97.1°F

## 2020-12-30 DIAGNOSIS — C310 Malignant neoplasm of maxillary sinus: Secondary | ICD-10-CM | POA: Diagnosis not present

## 2020-12-30 DIAGNOSIS — G893 Neoplasm related pain (acute) (chronic): Secondary | ICD-10-CM | POA: Diagnosis not present

## 2020-12-30 DIAGNOSIS — R062 Wheezing: Secondary | ICD-10-CM | POA: Insufficient documentation

## 2020-12-30 DIAGNOSIS — E876 Hypokalemia: Secondary | ICD-10-CM | POA: Diagnosis not present

## 2020-12-30 LAB — COMPREHENSIVE METABOLIC PANEL
ALT: 10 U/L (ref 0–44)
AST: 15 U/L (ref 15–41)
Albumin: 3.4 g/dL — ABNORMAL LOW (ref 3.5–5.0)
Alkaline Phosphatase: 86 U/L (ref 38–126)
Anion gap: 13 (ref 5–15)
BUN: 14 mg/dL (ref 8–23)
CO2: 26 mmol/L (ref 22–32)
Calcium: 8.8 mg/dL — ABNORMAL LOW (ref 8.9–10.3)
Chloride: 94 mmol/L — ABNORMAL LOW (ref 98–111)
Creatinine, Ser: 0.36 mg/dL — ABNORMAL LOW (ref 0.44–1.00)
GFR, Estimated: >60 mL/min (ref >60)
Glucose, Bld: 92 mg/dL (ref 70–99)
Potassium: 3.2 mmol/L — ABNORMAL LOW (ref 3.5–5.1)
Sodium: 133 mmol/L — ABNORMAL LOW (ref 135–145)
Total Bilirubin: 0.6 mg/dL (ref 0.3–1.2)
Total Protein: 7.1 g/dL (ref 6.5–8.1)

## 2020-12-30 LAB — CBC WITH DIFFERENTIAL/PLATELET
Abs Immature Granulocytes: 0.04 10*3/uL (ref 0.00–0.07)
Basophils Absolute: 0.1 10*3/uL (ref 0.0–0.1)
Basophils Relative: 1 %
Eosinophils Absolute: 0 10*3/uL (ref 0.0–0.5)
Eosinophils Relative: 0 %
HCT: 32.7 % — ABNORMAL LOW (ref 36.0–46.0)
Hemoglobin: 11.1 g/dL — ABNORMAL LOW (ref 12.0–15.0)
Immature Granulocytes: 0 %
Lymphocytes Relative: 7 %
Lymphs Abs: 0.6 10*3/uL — ABNORMAL LOW (ref 0.7–4.0)
MCH: 36.4 pg — ABNORMAL HIGH (ref 26.0–34.0)
MCHC: 33.9 g/dL (ref 30.0–36.0)
MCV: 107.2 fL — ABNORMAL HIGH (ref 80.0–100.0)
Monocytes Absolute: 1.1 10*3/uL — ABNORMAL HIGH (ref 0.1–1.0)
Monocytes Relative: 12 %
Neutro Abs: 7.3 10*3/uL (ref 1.7–7.7)
Neutrophils Relative %: 80 %
Platelets: 436 10*3/uL — ABNORMAL HIGH (ref 150–400)
RBC: 3.05 MIL/uL — ABNORMAL LOW (ref 3.87–5.11)
RDW: 13.1 % (ref 11.5–15.5)
WBC: 9.2 10*3/uL (ref 4.0–10.5)
nRBC: 0 % (ref 0.0–0.2)

## 2020-12-30 LAB — MAGNESIUM: Magnesium: 1.9 mg/dL (ref 1.7–2.4)

## 2020-12-30 IMAGING — CR DG CHEST 2V
1 series · 2 of 2 positions shown · non-contrast
Comparison: [DATE]

CLINICAL DATA: Wheezing

EXAM:
CHEST - 2 VIEW

[Series 1: dg chest 2 view · 0.14mm/px · 2 of 2 slices shown]
[im 1/2]
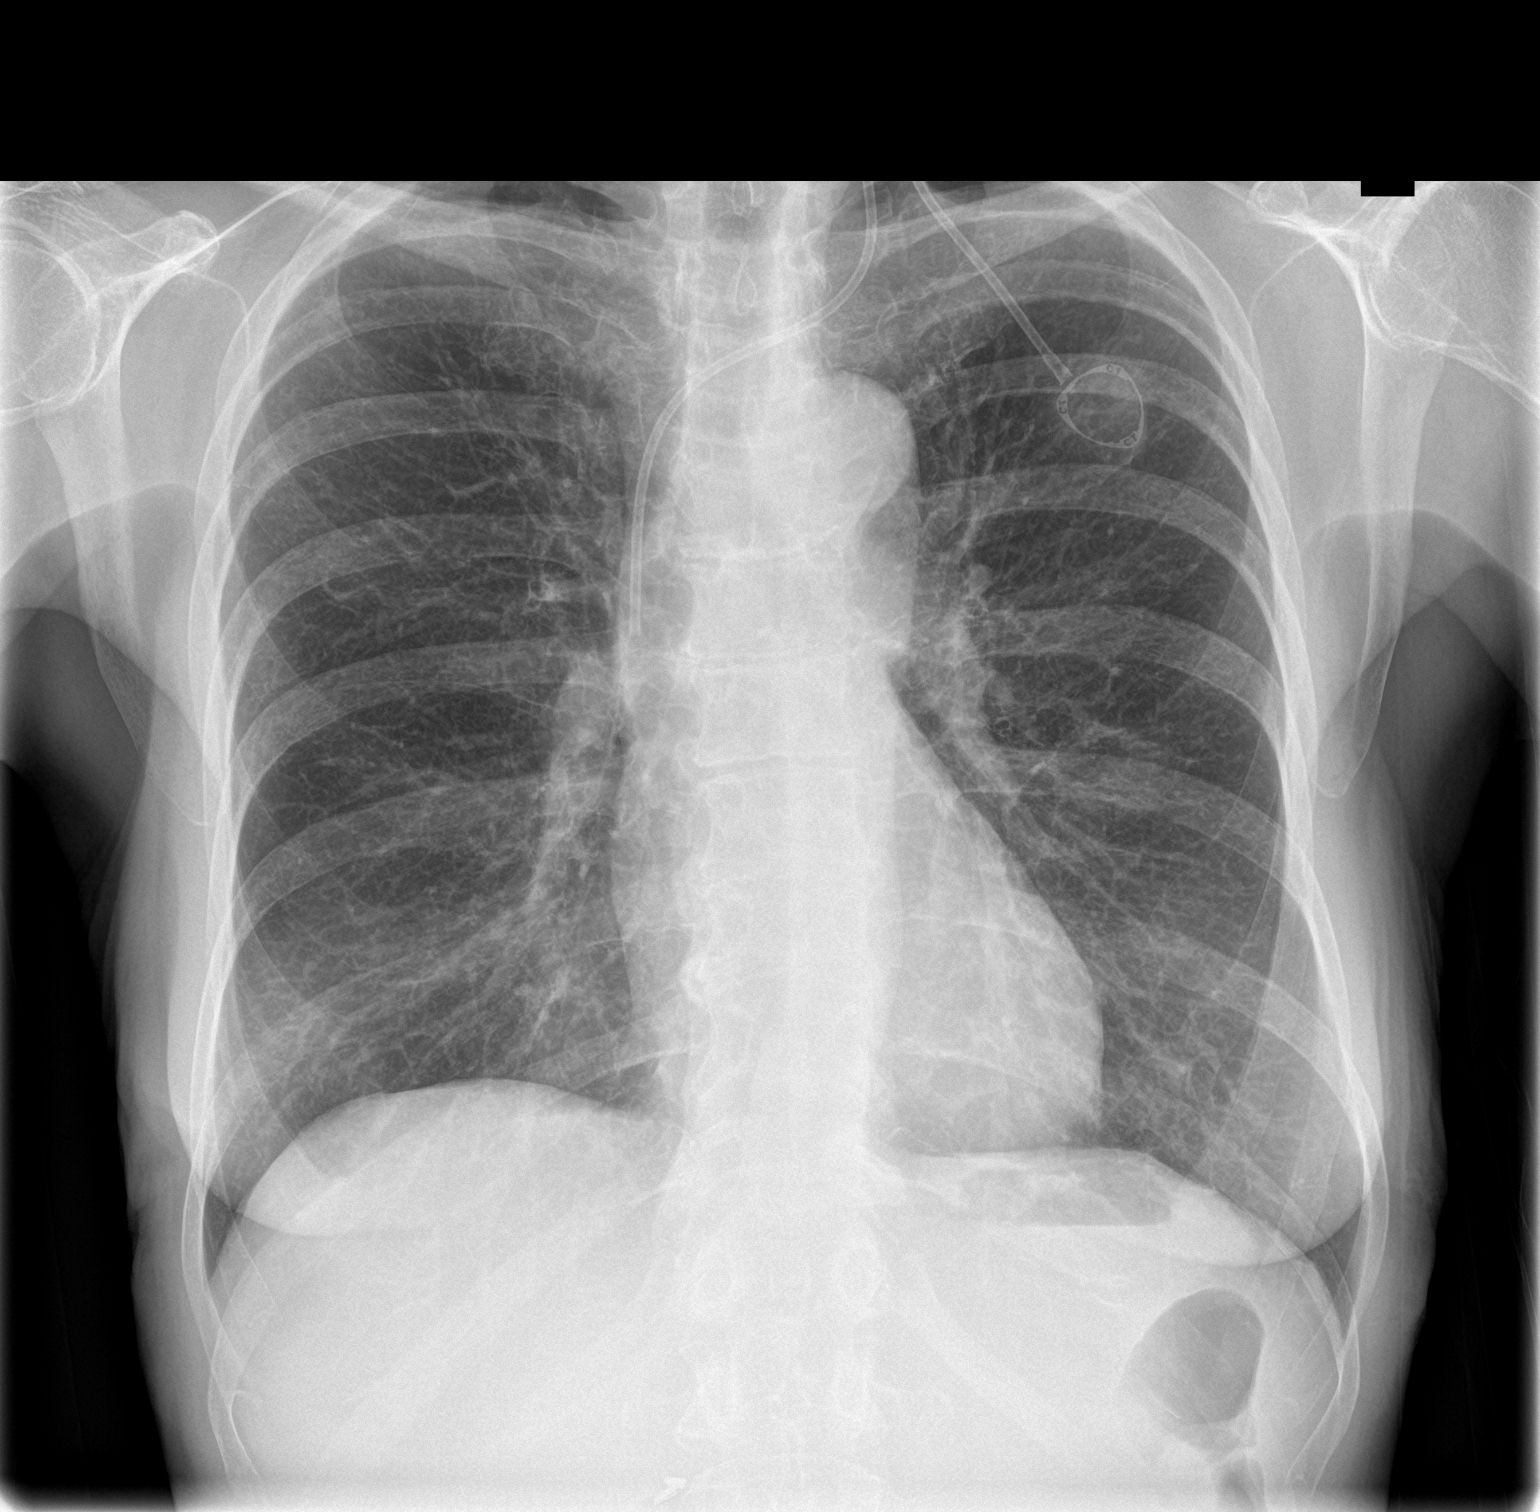
[im 2/2]
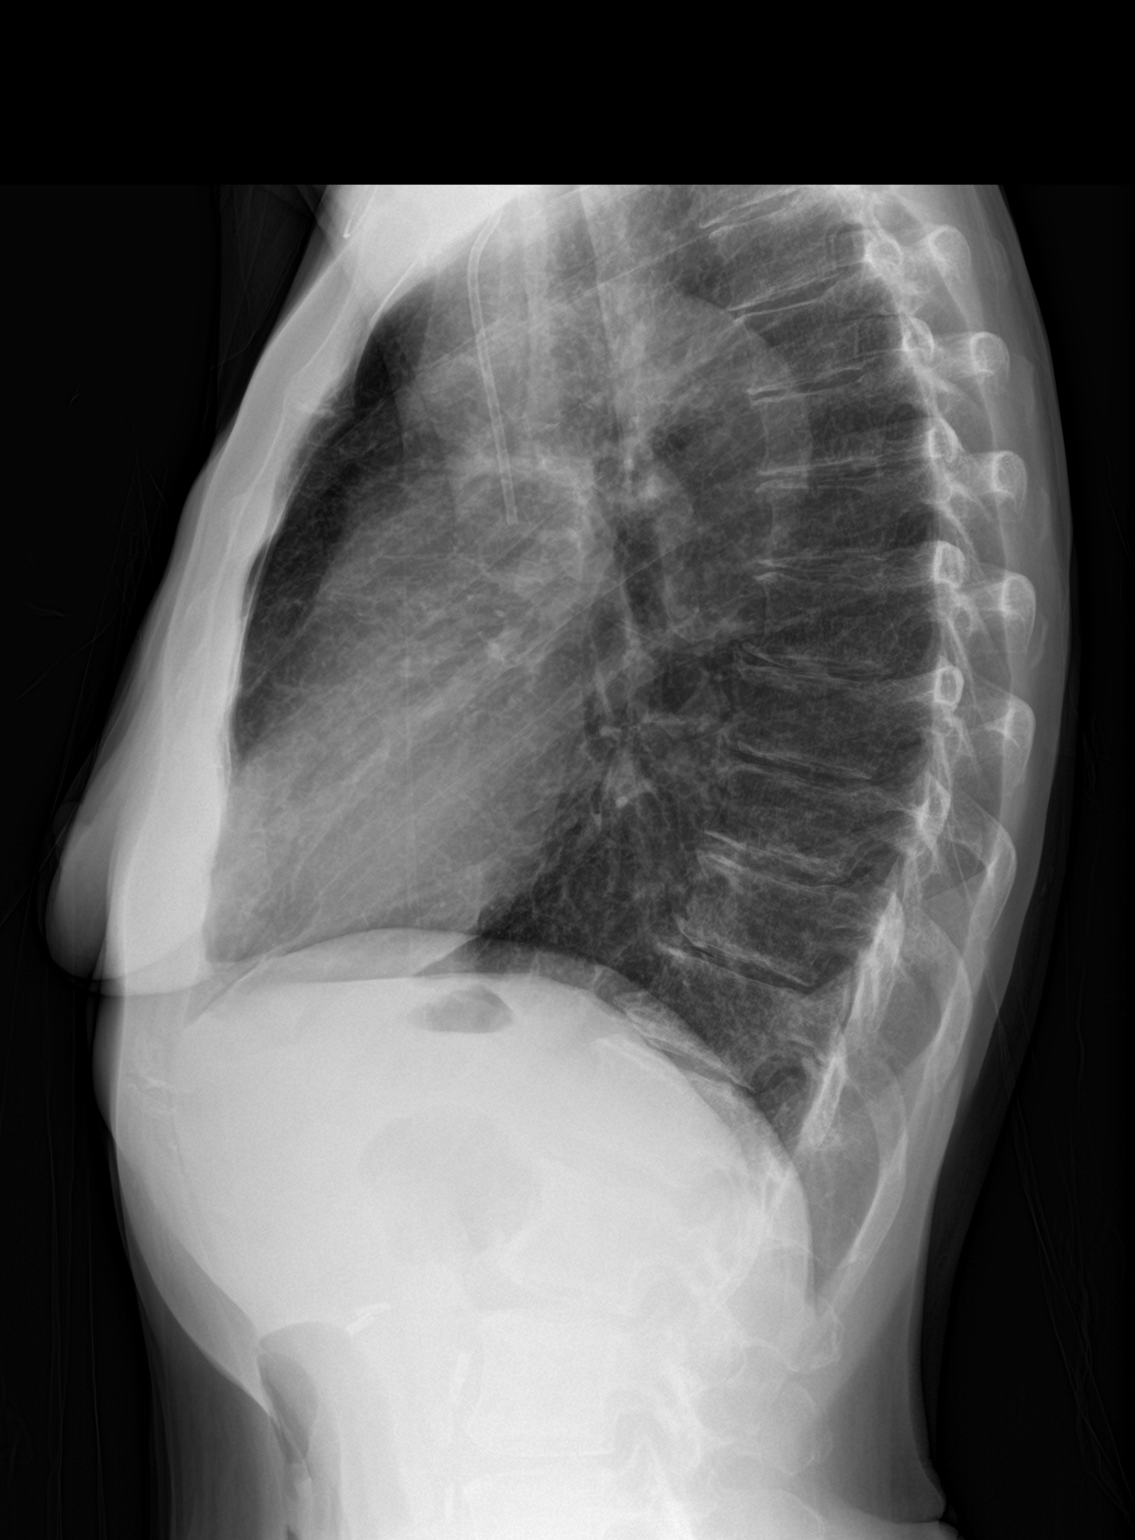

[2 of 2 positions shown; findings below may reference images not displayed]

FINDINGS: Lungs are clear. No pneumothorax or pleural effusion. Cardiac size
within normal limits. Left internal jugular chest port is unchanged
with its tip within the superior vena cava. Cardiac size within
normal limits. Pulmonary vascularity is normal.
IMPRESSION: No active cardiopulmonary disease.

## 2020-12-30 MED ORDER — POTASSIUM CHLORIDE IN NACL 20-0.9 MEQ/L-% IV SOLN
Freq: Once | INTRAVENOUS | Status: AC
  Filled 2020-12-30: qty 1000

## 2020-12-30 MED ORDER — OXYCODONE-ACETAMINOPHEN 5-325 MG PO TABS
2.0000 | ORAL_TABLET | Freq: Four times a day (QID) | ORAL | 0 refills | Status: DC | PRN
Start: 2020-12-30 — End: 2021-01-28

## 2020-12-30 MED ORDER — HEPARIN SOD (PORK) LOCK FLUSH 100 UNIT/ML IV SOLN
500.0000 [IU] | Freq: Once | INTRAVENOUS | Status: AC
  Administered 2020-12-30: 500 [IU] via INTRAVENOUS
  Filled 2020-12-30: qty 5

## 2020-12-30 MED ORDER — FENTANYL 37.5 MCG/HR TD PT72: 37.5000 ug | MEDICATED_PATCH | TRANSDERMAL | 0 refills | Status: DC

## 2020-12-30 MED ORDER — SODIUM CHLORIDE 0.9% FLUSH
10.0000 mL | INTRAVENOUS | Status: AC | PRN
  Administered 2020-12-30: 10 mL via INTRAVENOUS
  Filled 2020-12-30: qty 10

## 2020-12-30 MED ORDER — GABAPENTIN 300 MG PO CAPS: 300.0000 mg | ORAL_CAPSULE | Freq: Three times a day (TID) | ORAL | 0 refills | Status: DC

## 2020-12-30 NOTE — Progress Notes (Signed)
Hematology/Oncology follow up note [facility] [phone] [phone]   Patient Care Team: Pcp, No as PCP - General Rockey Situ, Kathlene November, MD as PCP - Cardiology (Cardiology) Earlie Server, MD as Consulting Physician (Oncology)  CHIEF COMPLAINTS/REASON FOR VISIT:  Follow-up for head and neck cancer  HISTORY OF PRESENTING ILLNESS:   Shannon Schroeder is a  65 y.o.  female with PMH listed below was seen in consultation at the request of ER Dr. [name] for evaluation of abnormal CT scan.  07/10/2020 she presented emergency room for evaluation of right-sided facial swelling and pain in the right side of her mouth for the past 6 weeks.  It started in her mouth with a small ulcer which progressively got worse.  She wears denture.  Not able to chew well.  She eats soft food. She was found to have right upper gum also with yellowish discharge noted. 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  Avoid piloting.,  Right orbital apex, also the right soft palate and palate and tonsil.  Furthermore there is evidence of intracranial extension along the right V2 at the foramen rotundum Small suspicious right level 1A lymph node measuring 7 mm.  Separate small indeterminate but suspicious enhancing soft tissue nodule of the left sublingual space.  Patient was referred to oncology for further evaluation management. Patient denies fever, chills, nausea vomiting diarrhea shortness of breath or cough.  Reports 10 out of 10 severe pain of right face and numbness.  She also has headache. Reports unintentional weight loss  She reports no contactable family members.  She is in the process of pointing her roommate [name] to be her power of attorney.  She also wants to record today's conversation for her roommate  [name]. Patient has 50-pack-year smoking history.  # seen by ENT Dr.Juengel and had a biopsy. Biopsy was sent to [facility] and the final pathology was positive for invasive squamous cell carcinoma, well-differentiated, keratinizing. 07/17/2020, brain MRI showed large right facial mass with right maxillary erosion involving the maxillary sinus, hard palate and pterygoid plates and muscles.  Perineural spreading along the multiple branches of the maxillary division of the right trigeminal nerve at the infraorbital canal, pterygopalatine fossa and foramen rotundum. 07/17/2020, PET scan showed Intense FDG uptake is associated with the large enhancing tumor which involves the right maxilla, right nasal cavity, right pterygoid palatine fossa, right orbital apex and right soft tissue palate and palatine tonsil. 2. Mild nonspecific FDG uptake is associated with the recently characterized suspicious right level 1A lymph node which measures 7 mm and has an SUV max of 2.12.  No signs of distant metastasis.  Aortic atherosclerosis.  Patient also reports a history of hypertension, dysrhythmia, history of V. tach.  Patient previously was on lisinopril, atenolol, amlodipine for which she is no longer taking at this point due to lack of medication coverage.  She also does not have any primary care provider. 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  On average she says she is takes 2 to 3 pills/day.  She also takes gabapentin 300 mg 3 times daily  # new onset of atrial fibrillation with rapid ventricular  response.  Patient was started on metoprolol.  Eliquis for anticoagulation.  # 09/06/2020- 09/11/2020 admitted due to A. fib, hypotension, multifocal pneumonia.  Patient was treated with IV antibiotics.  Cardiology saw the patient during admission and  added amiodarone.  Patient was discharged on 5 days course of oral antibiotics, continued on metoprolol and amiodarone for atrial fibrillation.  # Dysphagia, she declined PET tube placment.  #08/08/2020-10/11/2020 concurrent cisplatin and radiation.  INTERVAL HISTORY Shannon Schroeder is a 65 y.o. female who has above history reviewed by me today presents for follow up visit for management of head and neck cancer, post hospitalization follow up  Problems and complaints are listed below:  Intermittent dysphagia. Right facial pain and swelling is worse.  Patient reports needing more oxycodone.  On fentanyl patch 25 MCG per 72 hours. Patient has gained weight since last visit. Review of Systems  Constitutional: Positive for fatigue. Negative for appetite change, chills, fever and unexpected weight change.  HENT:   Negative for hearing loss, mouth sores and voice change.        Facial swelling, pain   Eyes: Negative for eye problems.  Respiratory: Negative for chest tightness, cough and shortness of breath.   Cardiovascular: Negative for chest pain and palpitations.  Gastrointestinal: Negative for abdominal distention, abdominal pain, blood in stool, constipation, nausea and vomiting.       Dysphagia  Endocrine: Negative for hot flashes.  Genitourinary: Negative for difficulty urinating and frequency.   Musculoskeletal: Negative for arthralgias.  Skin: Negative for itching and rash.  Neurological: Negative for extremity weakness and headaches.  Hematological: Negative for adenopathy.  Psychiatric/Behavioral: Negative for confusion.    MEDICAL HISTORY:  Past Medical History:  Diagnosis Date  . 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   Marland Kitchen Anxiety   . Arthritis   . Bipolar disorder ([place])   . COPD (chronic obstructive pulmonary disease) ([place])    NO INHALERS  . Depression   . Dysrhythmia    H/O V TACH  . Head and neck cancer ([place])  07/29/2020  . Headache    H/O MIGRAINES  . Hypertension   . Seizures ([place])    X1 AFTER FALL    SURGICAL HISTORY: Past Surgical History:  Procedure Laterality Date  . ABDOMINAL HYSTERECTOMY     partial  . BREAST SURGERY     biopsy  . CARDIAC CATHETERIZATION     X 2  . CARPAL TUNNEL RELEASE Right   . CARPECTOMY HAND Right   . CATARACT EXTRACTION W/PHACO Left 03/08/2018   Procedure: CATARACT EXTRACTION PHACO AND INTRAOCULAR LENS PLACEMENT (IOC);  Surgeon: Birder Robson, MD;  Location: [facility] ORS;  Service: Ophthalmology;  Laterality: Left;  [place] 00:55 AP% 13.5 CDE 7.52 Fluid pack lot # [id number]  . CATARACT EXTRACTION W/PHACO Right 04/05/2018   Procedure: CATARACT EXTRACTION PHACO AND INTRAOCULAR LENS PLACEMENT (IOC);  Surgeon: Birder Robson, MD;  Location: [facility] ORS;  Service: Ophthalmology;  Laterality: Right;  [place] 00:36 AP% 15.9 CDE 5.72 Fluid pack lot # [id number]  . CEREBRAL ANEURYSM REPAIR    . CHOLECYSTECTOMY    . COLONOSCOPY WITH PROPOFOL N/A 04/26/2019   Procedure: COLONOSCOPY WITH PROPOFOL;  Surgeon: Jonathon Bellows, MD;  Location: [facility] ENDOSCOPY;  Service: Gastroenterology;  Laterality: N/A;  . PORTACATH PLACEMENT Left 08/05/2020   Procedure: INSERTION PORT-A-CATH;  Surgeon: Herbert Pun, MD;  Location: [facility] ORS;  Service: General;  Laterality: Left;  . TUBAL LIGATION  SOCIAL HISTORY: Social History   Socioeconomic History  . Marital status: Divorced    Spouse name: Not on file  . Number of children: Not on file  . Years of education: 62  . Highest education level: Bachelor's degree (e.g., BA, AB, BS)  Occupational History  . Occupation: farmer  Tobacco Use  . Smoking status: Current Every Day Smoker    Packs/day: 0.25    Years: 50.00    Pack years: 12.50    Types: Cigarettes  . Smokeless tobacco: Never Used  Vaping Use  . Vaping Use: Never used  Substance and Sexual Activity  . Alcohol use: No  . Drug use: No  . Sexual activity: Not Currently   Other Topics Concern  . Not on file  Social History Narrative   Pt lives at [facility].    Social Determinants of Health   Financial Resource Strain: Not on file  Food Insecurity: Not on file  Transportation Needs: Not on file  Physical Activity: Not on file  Stress: Not on file  Social Connections: Not on file  Intimate Partner Violence: Not on file    FAMILY HISTORY: Family History  Problem Relation Age of Onset  . Hypertension Mother   . Heart failure Mother   . Hypertension Brother   . Stroke Brother   . Hypertension Son   . Emphysema Maternal Aunt   . Hypertension Paternal Aunt   . Hypertension Paternal Uncle   . Hypertension Maternal Grandmother   . Hypertension Paternal Grandmother     ALLERGIES:  is allergic to aspirin, belladonna alkaloids, fluoxetine hcl, naproxen, phenobarbital, and prozac [fluoxetine hcl].  MEDICATIONS:  Current Outpatient Medications  Medication Sig Dispense Refill  . amiodarone (PACERONE) 200 MG tablet Take 1 tablet (200 mg total) by mouth daily. 90 tablet 1  . apixaban (ELIQUIS) 5 MG TABS tablet Take 1 tablet (5 mg total) by mouth 2 (two) times daily. 180 tablet 3  . feeding supplement, ENSURE ENLIVE, (ENSURE ENLIVE) LIQD Take 237 mLs by mouth 3 (three) times daily between meals. 237 mL 12  . fentaNYL 37.5 MCG/HR PT72 Place 37.5 mcg onto the skin every 3 (three) days. 10 patch 0  . lidocaine-prilocaine (EMLA) cream Apply 1 application topically as needed [facility]). Apply to affected area once 30 g 2  . magnesium chloride (SLOW-MAG) 64 MG TBEC SR tablet Take 1 tablet (64 mg total) by mouth 2 (two) times daily. 60 tablet 1  . Melatonin 10 MG TABS Take 10 mg by mouth at bedtime as needed (sleep).    . Multiple Vitamins-Minerals (CENTRUM SILVER PO) Take 1 tablet by mouth daily. Gummie    . ondansetron (ZOFRAN-ODT) 8 MG disintegrating tablet Take 1 tablet (8 mg total) by mouth every 8 (eight) hours as needed for nausea or vomiting. 90  tablet 0  . potassium chloride 20 MEQ/15ML (10%) SOLN Take 30 mLs (40 mEq total) by mouth daily. [employer] to pay for medication 900 mL 0  . prochlorperazine (COMPAZINE) 10 MG tablet Take 1 tablet (10 mg total) by mouth every 6 (six) hours as needed (Nausea or vomiting). 30 tablet 1  . docusate sodium (COLACE) 100 MG capsule Take 1 capsule (100 mg total) by mouth daily. (Patient not taking: Reported on 12/30/2020) 30 capsule 1  . furosemide (LASIX) 20 MG tablet Take 1 tablet (20 mg total) by mouth daily as needed (for lower extremity swelling). 90 tablet 1  . gabapentin (NEURONTIN) 300 MG capsule Take 1 capsule (300  mg total) by mouth 3 (three) times daily. 90 capsule 0  . oxyCODONE-acetaminophen (PERCOCET) 5-325 MG tablet Take 2 tablets by mouth every 6 (six) hours as needed for severe pain. 60 tablet 0  . polyethylene glycol powder (GLYCOLAX/MIRALAX) 17 GM/SCOOP powder Take 17 g by mouth daily as needed for mild constipation or moderate constipation. (Patient not taking: Reported on 12/30/2020) 255 g 0   No current facility-administered medications for this visit.   Facility-Administered Medications Ordered in Other Visits  Medication Dose Route Frequency Provider Last Rate Last Admin  . sodium chloride flush (NS) 0.9 % injection 10 mL  10 mL Intravenous PRN Earlie Server, MD   10 mL at 10/10/20 1040  . sodium chloride flush (NS) 0.9 % injection 10 mL  10 mL Intravenous PRN Earlie Server, MD   10 mL at 11/13/20 0836  . sodium chloride flush (NS) 0.9 % injection 10 mL  10 mL Intravenous PRN Earlie Server, MD   10 mL at 12/30/20 0838     PHYSICAL EXAMINATION: ECOG PERFORMANCE STATUS: 1 - Symptomatic but completely ambulatory Vitals:   12/30/20 0844  BP: (!) 165/88  Pulse: 78  Resp: 18  Temp: (!) 96.7 F (35.9 C)  SpO2: 100%   Filed Weights   12/30/20 0844  Weight: 115 lb 8 oz (52.4 kg)    Physical Exam Constitutional:      General: She is not in acute distress.    Comments: Thin built.  HENT:      Head: Normocephalic and atraumatic.     Mouth/Throat:     Comments: No thrush.  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 Cardiovascular:     Rate and Rhythm: Normal rate and regular rhythm.     Heart sounds: Normal heart sounds.  Pulmonary:     Effort: Pulmonary effort is normal. No respiratory distress.     Breath sounds: Wheezing present.  Abdominal:     General: Bowel sounds are normal. There is no distension.     Palpations: Abdomen is soft.  Musculoskeletal:        General: No deformity. Normal range of motion.     Cervical back: Normal range of motion and neck supple.  Skin:    General: Skin is warm and dry.     Findings: No erythema or rash.  Neurological:     Mental Status: She is alert and oriented to person, place, and time. Mental status is at baseline.     Cranial Nerves: No cranial nerve deficit.     Coordination: Coordination normal.  Psychiatric:        Mood and Affect: Mood normal.     LABORATORY DATA:  I have reviewed the data as listed Lab Results  Component Value Date   WBC 9.2 12/30/2020   HGB 11.1 (L) 12/30/2020   HCT 32.7 (L) 12/30/2020   MCV 107.2 (H) 12/30/2020   PLT 436 (H) 12/30/2020   Recent Labs    07/10/20 1311 07/12/20 1204 08/08/20 0821 10/24/20 0915 10/29/20 1042 11/06/20 0803 12/04/20 0854 12/11/20 0815 12/30/20 0830  NA 136 137   < > 132* 132*   < > 133* 134* 133*  K 2.9* 4.0   < > 3.1* 3.4*   < > 3.3* 4.0 3.2*  CL 97* 98   < > 96* 96*   < > 94* 100 94*  CO2 28 27   < >  27 28   < > 27 25 26   GLUCOSE 100* 114*   < > 79 94   < > 98 93 92  BUN 13 8   < > 7* 7*   < > 10 12 14   CREATININE 0.43* 0.42*   < > 0.46 0.59   < > 0.41* 0.42* 0.36*  CALCIUM 8.9 9.0   < > 8.4* 8.8*   < > 9.1 9.0 8.8*  GFRNONAA >60 >60   < > >60 >60   < > >60 >60 >60  GFRAA >60 >60  --   --   --   --   --   --   --   PROT 7.0  --    < > 5.8* 6.5  --   --    --  7.1  ALBUMIN 3.7  --    < > 3.2* 3.6  --   --   --  3.4*  AST 20  --    < > 16 19  --   --   --  15  ALT 16  --    < > 10 9  --   --   --  10  ALKPHOS 106  --    < > 72 73  --   --   --  86  BILITOT 0.5  --    < > 0.9 0.6  --   --   --  0.6   < > = values in this interval not displayed.   Iron/TIBC/Ferritin/ %Sat    Component Value Date/Time   IRON 46 09/09/2020 0938   IRON 37 05/03/2018 1826   TIBC 210 (L) 09/09/2020 0938   FERRITIN 136 09/09/2020 0938   FERRITIN 52 05/03/2018 1826   IRONPCTSAT 22 09/09/2020 0938      RADIOGRAPHIC STUDIES: I have personally reviewed the radiological images as listed and agreed with the findings in the report. CT MAXILLOFACIAL W CONTRAST  Result Date: 12/04/2020 CLINICAL DATA:  Follow-up sinus cancer. EXAM: CT MAXILLOFACIAL WITH CONTRAST TECHNIQUE: Multidetector CT imaging of the maxillofacial structures was performed with intravenous contrast. Multiplanar CT image reconstructions were also generated. 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 The thickness of the irregularly-shaped tumor (which limits reproducible measurement) has improved but there is still enhancing peripheral and central likely necrotic material especially seen along the anteroinferior tumor bed, with progressive erosion in the anterior palate - further undermining the nasal spine. There is still asymmetric enhancement at the right Meckel's cave without visible progression into the prepontine cistern or brainstem. Indistinct material is seen at the right pterygoid musculature and pterygoid palatine fossa, suspect viable tumor in this location is well. There is history of facial pain which response antibiotics. Since prior there is  low-density opacification of right ethmoid air cells which is likely obstructive. Partial bilateral mastoid opacification which is likely treatment related. No visible involvement of the nasopharynx. Prior right pterional craniotomy for aneurysm clipping. No acute fracture.  No visible orbital tumor. IMPRESSION: 1. 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 There is been positive treatment response with decreased bulk of the tumor, although there is still evidence of viable tumor especially along the anteroinferior tumor bed, infratemporal fossa, and right Meckel's cave. The greatest tumoral enhancement is  at the level of the midline maxilla where there has been progressive bony erosion. 2. Interval right ethmoid sinusitis which may relate to the antibiotic responsive pain. Electronically Signed   By: Monte Fantasia M.D.   On: 12/04/2020 04:33      ASSESSMENT & PLAN:  1. 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[place])   2. Hypomagnesemia   3. Hypokalemia   4. Neoplasm related pain   5. 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  Status post concurrent chemotherapy and radiation.   Interval CT scan still showed viable tumor. ENT direct visualization showed no residual tumor. Discussed with radiation oncology.  We both agreed with waiting PET scan which is scheduled on 01/03/2021 for further evaluation.  Right facial pain /swelling  Continue oxycodone 5 mg every 6 hours as needed.  Increase fentanyl patch to 37.5 MCG.  72 hours. Continue garbapentine 300mg  TID.  Reviewed.  Chemotherapy-induced mucositis.  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 Recommend patient to utilize Magic mouthwash as needed.  #  hypokalemia, continue oral potassium to 64meq daily.  Patient will receive IV potassium chloride 20 mEq today.  # Hypomagnesia, continue oral magnesium supplementation.  Stable and normal magnesium level today.  #Atrial  fibrillation.  Continue amiodarone and metoprolol.  And Eliquis #Wheezing, I will check chest x-ray today. All questions were answered. The patient knows to call the clinic with any problems questions or concerns.   Return of visit: Lab MD after PET scan.  Magnesium +/- IV fluid/potassium/magnesium.  Earlie Server, MD, PhD Hematology Oncology [facility] at [facility] Pager- [phone] 12/30/2020

## 2020-12-30 NOTE — Progress Notes (Signed)
Continues to have swelling and pain in R side of face with swelling. Pt is eating soft solids. Pt has a appetite. Gained weight.

## 2020-12-30 NOTE — Progress Notes (Signed)
Received 1 liter of NS with potassium today. Going for CXR before going home. Discharged from clinic. Ambulatory. VSS.

## 2020-12-30 NOTE — Telephone Encounter (Signed)
Prior authorizatoin request for fentaNYL 37.5 MCG submitted via Cover My Meds (Key: [id number]).    PA approved from 11/30/2020 to 12/30/2021.

## 2020-12-31 ENCOUNTER — Other Ambulatory Visit: Payer: Self-pay

## 2020-12-31 MED ORDER — ALBUTEROL SULFATE HFA 108 (90 BASE) MCG/ACT IN AERS
2.0000 | INHALATION_SPRAY | Freq: Four times a day (QID) | RESPIRATORY_TRACT | 0 refills | Status: DC | PRN
Start: 2020-12-31 — End: 2021-05-14

## 2021-01-09 ENCOUNTER — Encounter
Admission: RE | Admit: 2021-01-09 | Discharge: 2021-01-09 | Disposition: A | Discharge status: Home / Self Care | Payer: Medicare Other | Source: Ambulatory Visit | Attending: Oncology | Admitting: Oncology

## 2021-01-09 ENCOUNTER — Other Ambulatory Visit: Payer: Self-pay

## 2021-01-09 DIAGNOSIS — C310 Malignant neoplasm of maxillary sinus: Secondary | ICD-10-CM | POA: Insufficient documentation

## 2021-01-09 LAB — GLUCOSE, CAPILLARY: Glucose-Capillary: 82 mg/dL (ref 70–99)

## 2021-01-09 IMAGING — CT NM PET TUM IMG RESTAG (PS) SKULL BASE T - THIGH
10 series · 24 of 25 positions shown · non-contrast
Comparison: CT maxillary [DATE], PET-CT [DATE]

CLINICAL DATA: Subsequent treatment strategy for head neck
carcinoma.

EXAM:
NUCLEAR MEDICINE PET SKULL BASE TO THIGH
TECHNIQUE: 6.0 mCi F-18 FDG was injected intravenously. Full-ring PET imaging
was performed from the skull base to thigh after the radiotracer. CT
data was obtained and used for attenuation correction and anatomic
localization.
Fasting blood glucose: 82 mg/dl

[Series 3: ct wb 5.0 b30f · axial · 5.0mm · 0.98mm/px · z∈[-240,+628]mm · 3 of 290 slices shown]
[im 1/290]
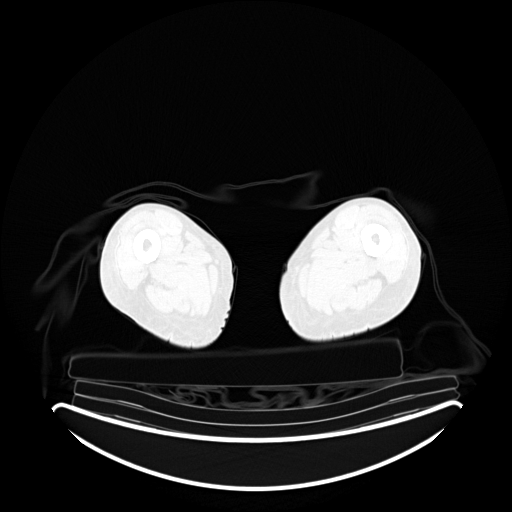
[im 145/290]
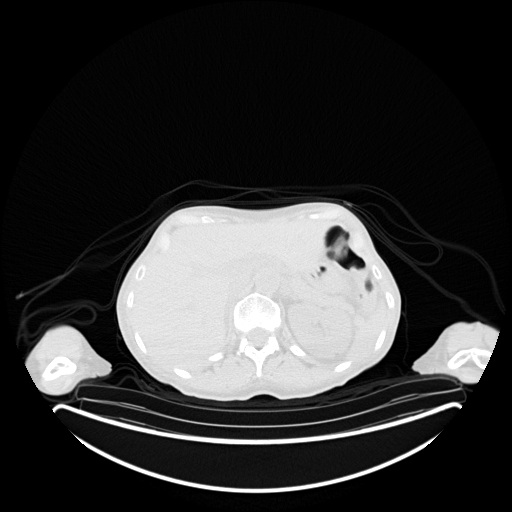
[im 290/290  brain]
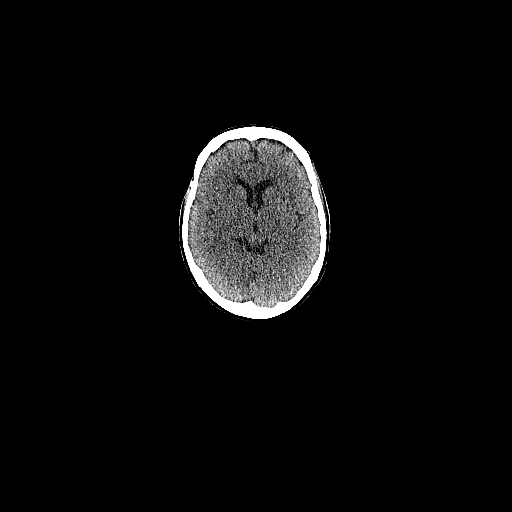

[Series 5: pet wb uncorrected (nac) · axial · 5.0mm · 4.07mm/px · z∈[-240,+628]mm · 3 of 290 slices shown]
[im 1/290]
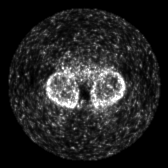
[im 145/290]
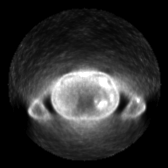
[im 290/290]
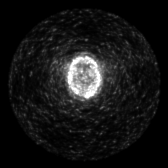

[Series 6: pet wb (ac) · axial · 5.0mm · 2.72mm/px · z∈[-240,+628]mm · 4 of 290 slices shown]
[im 1/290]
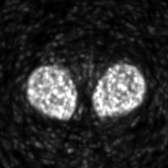
[im 97/290]
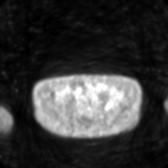
[im 193/290]
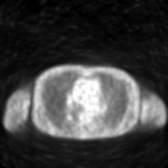
[im 290/290]
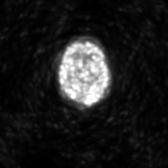

[Series 603: fused axial · 3 of 287 slices shown]
[im 1/287]
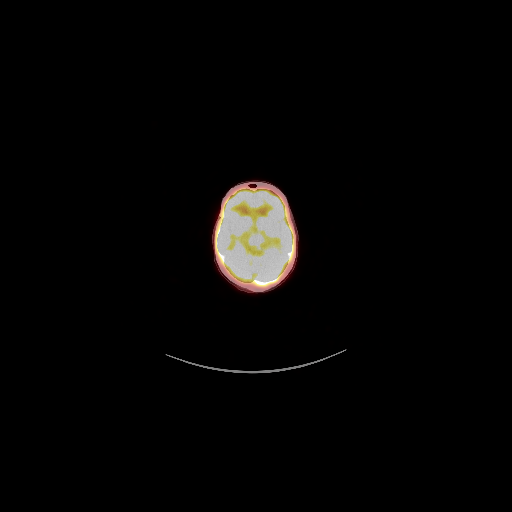
[im 96/287]
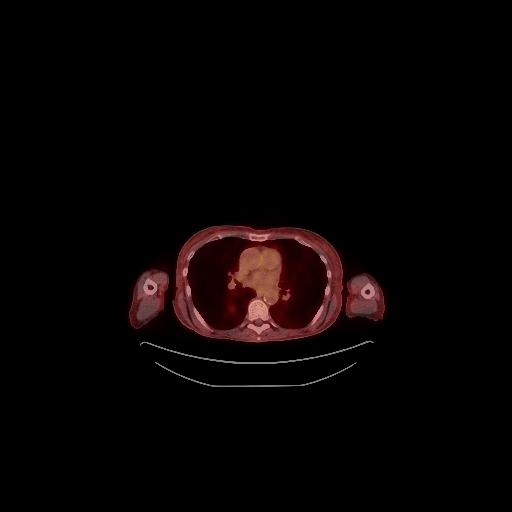
[im 287/287]
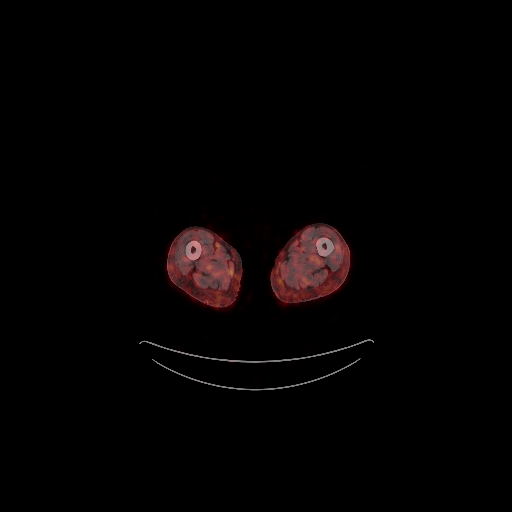

[Series 604: fused coronal · 1 of 46 slices shown]
[im 1/46]
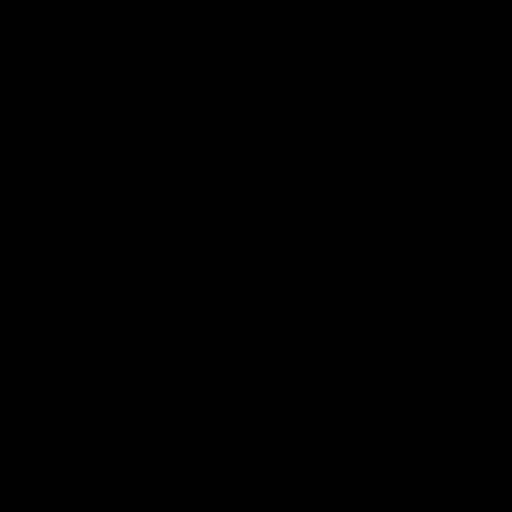

[Series 605: fused sagittal · 2 of 124 slices shown]
[im 1/124]
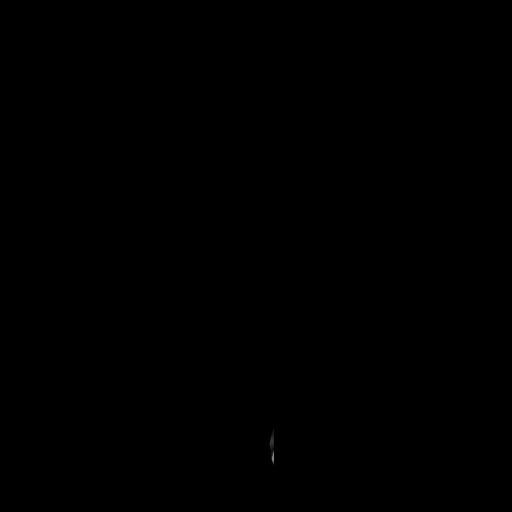
[im 124/124]
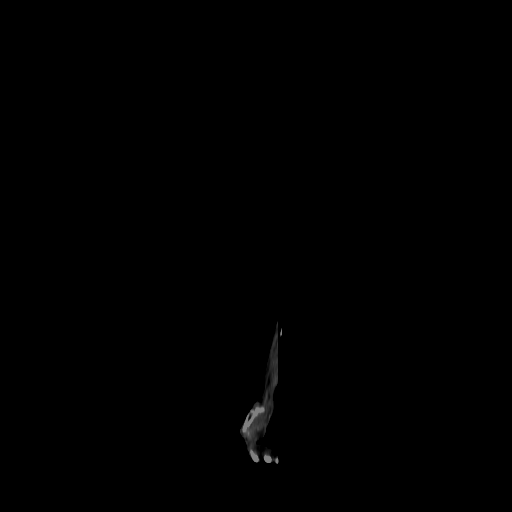

[Series 606: pet axial · 4 of 287 slices shown]
[im 1/287]
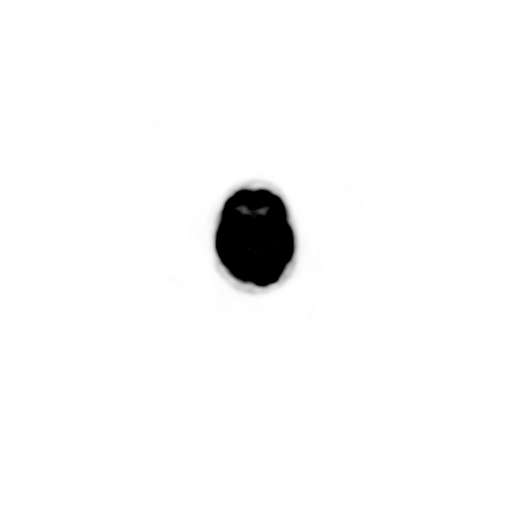
[im 96/287]
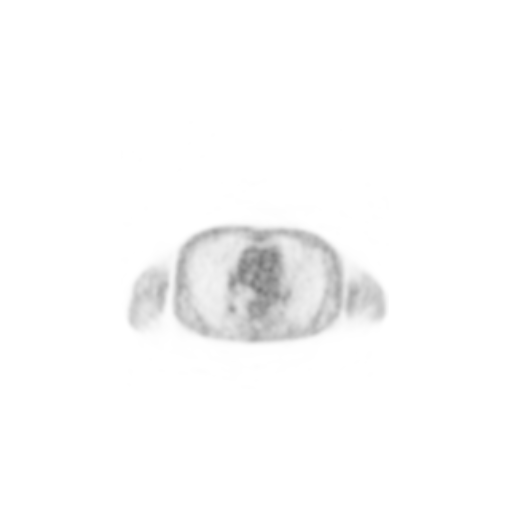
[im 191/287]
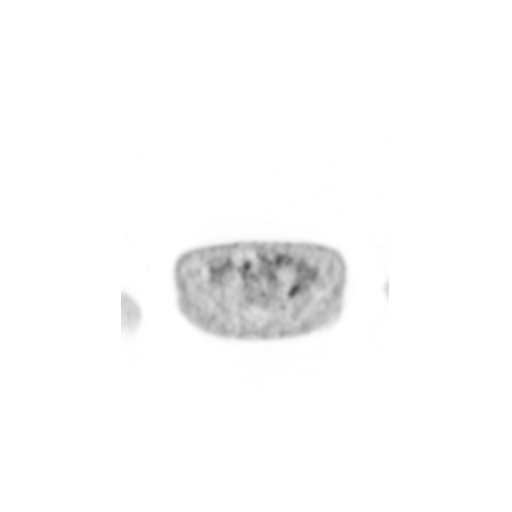
[im 287/287]
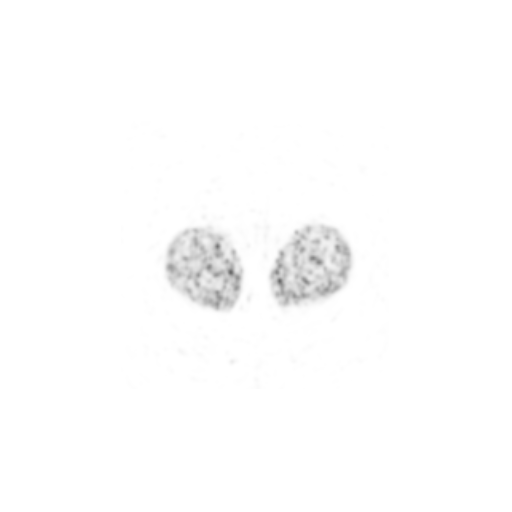

[Series 607: pet coronal · 1 of 98 slices shown]
[im 1/98]
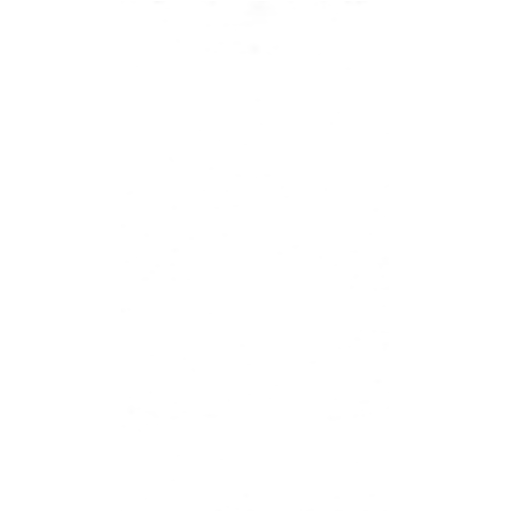

[Series 608: pet sagittal · 2 of 152 slices shown]
[im 1/152]
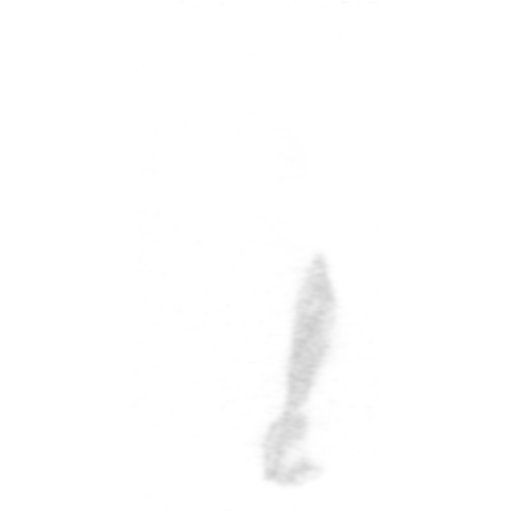
[im 152/152]
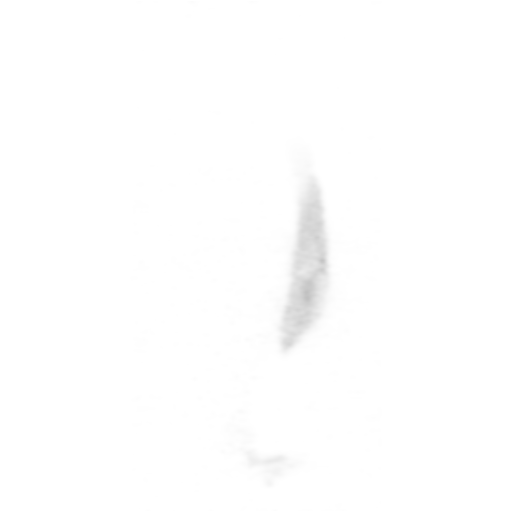

[Series 1153: results mm oncology reading · 1.0mm · 0.58mm/px · 1 of 3 slices shown]
[im 1/3]
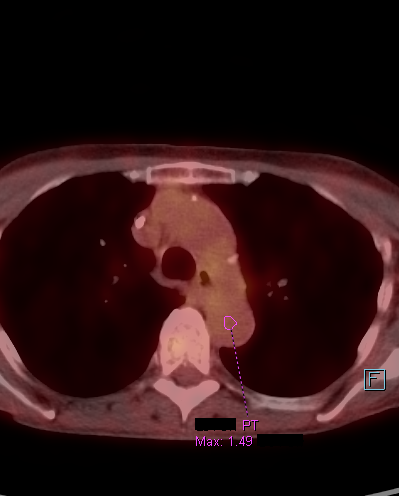

[24 of 25 positions shown; findings below may reference images not displayed]

FINDINGS: Mediastinal blood pool activity: SUV max

Liver activity: SUV max NA

NECK: There is intense metabolic activity localizing to the tissue
plane previously occupied by the anterior RIGHT maxillary bone.
Activity is intense with SUV max equal 7.3. The volume of enhancing
tissue and degree of hypermetabolic activity is decreased from
comparison exam with thickened tissue anteriorly previously with SUV
max of 13.5.

Activity primarily along the anterior maxilla and midline. There is
significantly decreased activity in the RIGHT posterior nasopharynx
compared to prior.

There is a focus of persistent activity at the level of the
pterygoid plate on the RIGHT with SUV max equal 6.5 on image 269

Activity extends to the skull base along the posterior inferior
medial aspect of the sphenoid bone (image 274) with SUV max equal
3.9.

No hypermetabolic cervical nodes

Incidental CT findings: None

CHEST: No hypermetabolic mediastinal or hilar nodes. No suspicious
pulmonary nodules on the CT scan.

Incidental CT findings: none

ABDOMEN/PELVIS: No abnormal hypermetabolic activity within the
liver, pancreas, adrenal glands, or spleen. No hypermetabolic lymph
nodes in the abdomen or pelvis.

Incidental CT findings: none

SKELETON: No focal hypermetabolic activity to suggest skeletal
metastasis outside of skull.

Incidental CT findings: none
IMPRESSION: 1. Improved but persistent hypermetabolic activity associated with
the RIGHT frontal sinus carcinoma.
2. Marked improvement in the posterior RIGHT nasopharynx with
decreased metabolic activity iand tissue thickening.
3. Persistent but improved hypermetabolic thickened tissue in the
anterior and medial RIGHT maxillary sinus
4. Persistent hypermetabolic activity posteriorly at the level of
the pterygoid plate on the RIGHT.
5. Potential new activity in the RIGHT skull base inferior medial
along the sphenoid bone.
6. No evidence of malignancy outside of the head neck.

## 2021-01-09 MED ORDER — FLUDEOXYGLUCOSE F - 18 (FDG) INJECTION
6.0000 | Freq: Once | INTRAVENOUS | Status: AC | PRN
  Administered 2021-01-09: 6.117 via INTRAVENOUS

## 2021-01-14 ENCOUNTER — Inpatient Hospital Stay: Payer: Medicare Other | Attending: Oncology

## 2021-01-14 ENCOUNTER — Encounter: Payer: Self-pay | Admitting: Oncology

## 2021-01-14 ENCOUNTER — Other Ambulatory Visit: Payer: Self-pay | Admitting: Oncology

## 2021-01-14 ENCOUNTER — Inpatient Hospital Stay (HOSPITAL_BASED_OUTPATIENT_CLINIC_OR_DEPARTMENT_OTHER): Payer: Medicare Other | Admitting: Oncology

## 2021-01-14 ENCOUNTER — Other Ambulatory Visit: Payer: Self-pay

## 2021-01-14 ENCOUNTER — Inpatient Hospital Stay: Payer: Medicare Other

## 2021-01-14 VITALS — BP 121/82 | HR 65 | Temp 97.5°F | Resp 16 | Wt 111.3 lb

## 2021-01-14 DIAGNOSIS — F319 Bipolar disorder, unspecified: Secondary | ICD-10-CM | POA: Insufficient documentation

## 2021-01-14 DIAGNOSIS — Z9221 Personal history of antineoplastic chemotherapy: Secondary | ICD-10-CM | POA: Diagnosis not present

## 2021-01-14 DIAGNOSIS — C310 Malignant neoplasm of maxillary sinus: Secondary | ICD-10-CM

## 2021-01-14 DIAGNOSIS — J449 Chronic obstructive pulmonary disease, unspecified: Secondary | ICD-10-CM | POA: Insufficient documentation

## 2021-01-14 DIAGNOSIS — F17210 Nicotine dependence, cigarettes, uncomplicated: Secondary | ICD-10-CM | POA: Insufficient documentation

## 2021-01-14 DIAGNOSIS — I10 Essential (primary) hypertension: Secondary | ICD-10-CM | POA: Diagnosis not present

## 2021-01-14 DIAGNOSIS — R1310 Dysphagia, unspecified: Secondary | ICD-10-CM | POA: Insufficient documentation

## 2021-01-14 DIAGNOSIS — Z923 Personal history of irradiation: Secondary | ICD-10-CM | POA: Diagnosis not present

## 2021-01-14 DIAGNOSIS — E871 Hypo-osmolality and hyponatremia: Secondary | ICD-10-CM

## 2021-01-14 DIAGNOSIS — Z8679 Personal history of other diseases of the circulatory system: Secondary | ICD-10-CM

## 2021-01-14 DIAGNOSIS — G893 Neoplasm related pain (acute) (chronic): Secondary | ICD-10-CM

## 2021-01-14 DIAGNOSIS — I4891 Unspecified atrial fibrillation: Secondary | ICD-10-CM | POA: Insufficient documentation

## 2021-01-14 DIAGNOSIS — Z79899 Other long term (current) drug therapy: Secondary | ICD-10-CM | POA: Diagnosis not present

## 2021-01-14 DIAGNOSIS — Z7901 Long term (current) use of anticoagulants: Secondary | ICD-10-CM | POA: Insufficient documentation

## 2021-01-14 DIAGNOSIS — E876 Hypokalemia: Secondary | ICD-10-CM

## 2021-01-14 LAB — CBC WITH DIFFERENTIAL/PLATELET
Abs Immature Granulocytes: 0.04 10*3/uL (ref 0.00–0.07)
Basophils Absolute: 0.1 10*3/uL (ref 0.0–0.1)
Basophils Relative: 1 %
Eosinophils Absolute: 0.1 10*3/uL (ref 0.0–0.5)
Eosinophils Relative: 1 %
HCT: 30.5 % — ABNORMAL LOW (ref 36.0–46.0)
Hemoglobin: 10.3 g/dL — ABNORMAL LOW (ref 12.0–15.0)
Immature Granulocytes: 1 %
Lymphocytes Relative: 7 %
Lymphs Abs: 0.6 10*3/uL — ABNORMAL LOW (ref 0.7–4.0)
MCH: 35.2 pg — ABNORMAL HIGH (ref 26.0–34.0)
MCHC: 33.8 g/dL (ref 30.0–36.0)
MCV: 104.1 fL — ABNORMAL HIGH (ref 80.0–100.0)
Monocytes Absolute: 1.2 10*3/uL — ABNORMAL HIGH (ref 0.1–1.0)
Monocytes Relative: 13 %
Neutro Abs: 6.7 10*3/uL (ref 1.7–7.7)
Neutrophils Relative %: 77 %
Platelets: 555 10*3/uL — ABNORMAL HIGH (ref 150–400)
RBC: 2.93 MIL/uL — ABNORMAL LOW (ref 3.87–5.11)
RDW: 12.2 % (ref 11.5–15.5)
WBC: 8.6 10*3/uL (ref 4.0–10.5)
nRBC: 0 % (ref 0.0–0.2)

## 2021-01-14 LAB — BASIC METABOLIC PANEL
Anion gap: 13 (ref 5–15)
BUN: 10 mg/dL (ref 8–23)
CO2: 27 mmol/L (ref 22–32)
Calcium: 9.1 mg/dL (ref 8.9–10.3)
Chloride: 94 mmol/L — ABNORMAL LOW (ref 98–111)
Creatinine, Ser: 0.54 mg/dL (ref 0.44–1.00)
GFR, Estimated: >60 mL/min (ref >60)
Glucose, Bld: 93 mg/dL (ref 70–99)
Potassium: 3.2 mmol/L — ABNORMAL LOW (ref 3.5–5.1)
Sodium: 134 mmol/L — ABNORMAL LOW (ref 135–145)

## 2021-01-14 LAB — MAGNESIUM: Magnesium: 1.8 mg/dL (ref 1.7–2.4)

## 2021-01-14 MED ORDER — SODIUM CHLORIDE 0.9 % IV SOLN: Freq: Once | INTRAVENOUS | Status: DC

## 2021-01-14 MED ORDER — POTASSIUM CHLORIDE IN NACL 20-0.9 MEQ/L-% IV SOLN
Freq: Once | INTRAVENOUS | Status: AC
  Filled 2021-01-14: qty 1000

## 2021-01-14 MED ORDER — SODIUM CHLORIDE 0.9% FLUSH
10.0000 mL | INTRAVENOUS | Status: AC | PRN
  Administered 2021-01-14: 10 mL via INTRAVENOUS
  Filled 2021-01-14: qty 10

## 2021-01-14 MED ORDER — MIRTAZAPINE 15 MG PO TABS: 15.0000 mg | ORAL_TABLET | Freq: Every day | ORAL | 0 refills | Status: DC

## 2021-01-14 MED ORDER — HEPARIN SOD (PORK) LOCK FLUSH 100 UNIT/ML IV SOLN
500.0000 [IU] | Freq: Once | INTRAVENOUS | Status: AC
  Administered 2021-01-14: 500 [IU] via INTRAVENOUS
  Filled 2021-01-14: qty 5

## 2021-01-14 NOTE — Progress Notes (Signed)
Patient reports an increase in anxiety which is making it harder for her to sleep at night.  The pain has improved with the increase dose of Fentanyl patch.

## 2021-01-14 NOTE — Progress Notes (Signed)
Patient received 1 liter of NS + Potassium today per MD order. Discharged to home. VSS. Pt ambulatory. Home with [place].

## 2021-01-14 NOTE — Progress Notes (Signed)
Hematology/Oncology follow up note [facility] [phone] [phone]   Patient Care Team: Pcp, No as PCP - General Rockey Situ, Kathlene November, MD as PCP - Cardiology (Cardiology) Earlie Server, MD as Consulting Physician (Oncology)  CHIEF COMPLAINTS/REASON FOR VISIT:  Follow-up for head and neck cancer  HISTORY OF PRESENTING ILLNESS:   Shannon Schroeder is a  65 y.o.  female with PMH listed below was seen in consultation at the request of ER Dr. [name] for evaluation of abnormal CT scan.  07/10/2020 she presented emergency room for evaluation of right-sided facial swelling and pain in the right side of her mouth for the past 6 weeks.  It started in her mouth with a small ulcer which progressively got worse.  She wears denture.  Not able to chew well.  She eats soft food. She was found to have right upper gum also with yellowish discharge noted. 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  Avoid piloting.,  Right orbital apex, also the right soft palate and palate and tonsil.  Furthermore there is evidence of intracranial extension along the right V2 at the foramen rotundum Small suspicious right level 1A lymph node measuring 7 mm.  Separate small indeterminate but suspicious enhancing soft tissue nodule of the left sublingual space.  Patient was referred to oncology for further evaluation management. Patient denies fever, chills, nausea vomiting diarrhea shortness of breath or cough.  Reports 10 out of 10 severe pain of right face and numbness.  She also has headache. Reports unintentional weight loss  She reports no contactable family members.  She is in the process of pointing her roommate [name] to be her power of attorney.  She also wants to record today's conversation for her roommate  [name]. Patient has 50-pack-year smoking history.  # seen by ENT Dr.Juengel and had a biopsy. Biopsy was sent to [facility] and the final pathology was positive for invasive squamous cell carcinoma, well-differentiated, keratinizing. 07/17/2020, brain MRI showed large right facial mass with right maxillary erosion involving the maxillary sinus, hard palate and pterygoid plates and muscles.  Perineural spreading along the multiple branches of the maxillary division of the right trigeminal nerve at the infraorbital canal, pterygopalatine fossa and foramen rotundum. 07/17/2020, PET scan showed Intense FDG uptake is associated with the large enhancing tumor which involves the right maxilla, right nasal cavity, right pterygoid palatine fossa, right orbital apex and right soft tissue palate and palatine tonsil. 2. Mild nonspecific FDG uptake is associated with the recently characterized suspicious right level 1A lymph node which measures 7 mm and has an SUV max of 2.12.  No signs of distant metastasis.  Aortic atherosclerosis.  Patient also reports a history of hypertension, dysrhythmia, history of V. tach.  Patient previously was on lisinopril, atenolol, amlodipine for which she is no longer taking at this point due to lack of medication coverage.  She also does not have any primary care provider. 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  On average she says she is takes 2 to 3 pills/day.  She also takes gabapentin 300 mg 3 times daily  # new onset of atrial fibrillation with rapid ventricular  response.  Patient was started on metoprolol.  Eliquis for anticoagulation.  # 09/06/2020- 09/11/2020 admitted due to A. fib, hypotension, multifocal pneumonia.  Patient was treated with IV antibiotics.  Cardiology saw the patient during admission and  added amiodarone.  Patient was discharged on 5 days course of oral antibiotics, continued on metoprolol and amiodarone for atrial fibrillation.  # Dysphagia, she declined PET tube placment.  #08/08/2020-10/11/2020 concurrent cisplatin and radiation.  INTERVAL HISTORY Shannon Schroeder is a 65 y.o. female who has above history reviewed by me today presents for follow up visit for management of head and neck cancer, post hospitalization follow up  Problems and complaints are listed below:  She is on fentanyl patch and oxycodone PRN for pain. Pain has gotten better a few weeks after she finished chemotherapy and radiation, but has come back now and she is using similar amount of narcotics to control the pain She has insomnia and also increased anxiety level.   Review of Systems  Constitutional: Positive for fatigue. Negative for appetite change, chills, fever and unexpected weight change.  HENT:   Negative for hearing loss, mouth sores and voice change.        Facial swelling, pain   Eyes: Negative for eye problems.  Respiratory: Negative for chest tightness, cough and shortness of breath.   Cardiovascular: Negative for chest pain and palpitations.  Gastrointestinal: Negative for abdominal distention, abdominal pain, blood in stool, constipation, nausea and vomiting.       Dysphagia  Endocrine: Negative for hot flashes.  Genitourinary: Negative for difficulty urinating and frequency.   Musculoskeletal: Negative for arthralgias.  Skin: Negative for itching and rash.  Neurological: Negative for extremity weakness and headaches.  Hematological: Negative for adenopathy.  Psychiatric/Behavioral: Negative for confusion.    MEDICAL HISTORY:  Past Medical History:  Diagnosis Date  . 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   Marland Kitchen Anxiety   . Arthritis   . Bipolar disorder ([place])   . COPD (chronic obstructive pulmonary disease) ([place])    NO  INHALERS  . Depression   . Dysrhythmia    H/O V TACH  . Head and neck cancer ([place]) 07/29/2020  . Headache    H/O MIGRAINES  . Hypertension   . Seizures ([place])    X1 AFTER FALL    SURGICAL HISTORY: Past Surgical History:  Procedure Laterality Date  . ABDOMINAL HYSTERECTOMY     partial  . BREAST SURGERY     biopsy  . CARDIAC CATHETERIZATION     X 2  . CARPAL TUNNEL RELEASE Right   . CARPECTOMY HAND Right   . CATARACT EXTRACTION W/PHACO Left 03/08/2018   Procedure: CATARACT EXTRACTION PHACO AND INTRAOCULAR LENS PLACEMENT (IOC);  Surgeon: Birder Robson, MD;  Location: [facility] ORS;  Service: Ophthalmology;  Laterality: Left;  [place] 00:55 AP% 13.5 CDE 7.52 Fluid pack lot # [id number]  . CATARACT EXTRACTION W/PHACO Right 04/05/2018   Procedure: CATARACT EXTRACTION PHACO AND INTRAOCULAR LENS PLACEMENT (IOC);  Surgeon: Birder Robson, MD;  Location: [facility] ORS;  Service: Ophthalmology;  Laterality: Right;  [place] 00:36 AP% 15.9 CDE 5.72 Fluid pack lot # [id number]  . CEREBRAL ANEURYSM REPAIR    . CHOLECYSTECTOMY    . COLONOSCOPY WITH PROPOFOL N/A 04/26/2019   Procedure: COLONOSCOPY WITH PROPOFOL;  Surgeon: Jonathon Bellows, MD;  Location: [facility] ENDOSCOPY;  Service: Gastroenterology;  Laterality: N/A;  . PORTACATH PLACEMENT Left 08/05/2020   Procedure: INSERTION PORT-A-CATH;  Surgeon: Herbert Pun,  MD;  Location: [facility] ORS;  Service: General;  Laterality: Left;  . TUBAL LIGATION      SOCIAL HISTORY: Social History   Socioeconomic History  . Marital status: Divorced    Spouse name: Not on file  . Number of children: Not on file  . Years of education: 10  . Highest education level: Bachelor's degree (e.g., BA, AB, BS)  Occupational History  . Occupation: farmer  Tobacco Use  . Smoking status: Current Every Day Smoker    Packs/day: 0.25    Years: 50.00    Pack years: 12.50    Types: Cigarettes  . Smokeless tobacco: Never Used  Vaping Use  . Vaping Use: Never used  Substance and  Sexual Activity  . Alcohol use: No  . Drug use: No  . Sexual activity: Not Currently  Other Topics Concern  . Not on file  Social History Narrative   Pt lives at [facility].    Social Determinants of Health   Financial Resource Strain: Not on file  Food Insecurity: Not on file  Transportation Needs: Not on file  Physical Activity: Not on file  Stress: Not on file  Social Connections: Not on file  Intimate Partner Violence: Not on file    FAMILY HISTORY: Family History  Problem Relation Age of Onset  . Hypertension Mother   . Heart failure Mother   . Hypertension Brother   . Stroke Brother   . Hypertension Son   . Emphysema Maternal Aunt   . Hypertension Paternal Aunt   . Hypertension Paternal Uncle   . Hypertension Maternal Grandmother   . Hypertension Paternal Grandmother     ALLERGIES:  is allergic to aspirin, belladonna alkaloids, fluoxetine hcl, naproxen, phenobarbital, and prozac [fluoxetine hcl].  MEDICATIONS:  Current Outpatient Medications  Medication Sig Dispense Refill  . albuterol (VENTOLIN HFA) 108 (90 Base) MCG/ACT inhaler Inhale 2 puffs into the lungs every 6 (six) hours as needed for wheezing or shortness of breath. 8 g 0  . amiodarone (PACERONE) 200 MG tablet Take 1 tablet (200 mg total) by mouth daily. 90 tablet 1  . amoxicillin (AMOXIL) 500 MG capsule Take 500 mg by mouth 3 (three) times daily.    Marland Kitchen apixaban (ELIQUIS) 5 MG TABS tablet Take 1 tablet (5 mg total) by mouth 2 (two) times daily. 180 tablet 3  . feeding supplement, ENSURE ENLIVE, (ENSURE ENLIVE) LIQD Take 237 mLs by mouth 3 (three) times daily between meals. 237 mL 12  . fentaNYL 37.5 MCG/HR PT72 Place 37.5 mcg onto the skin every 3 (three) days. 10 patch 0  . furosemide (LASIX) 20 MG tablet Take 1 tablet (20 mg total) by mouth daily as needed (for lower extremity swelling). 90 tablet 1  . gabapentin (NEURONTIN) 300 MG capsule Take 1 capsule (300 mg total) by mouth 3 (three) times  daily. 90 capsule 0  . lidocaine-prilocaine (EMLA) cream Apply 1 application topically as needed [facility]). Apply to affected area once 30 g 2  . mirtazapine (REMERON) 15 MG tablet Take 1 tablet (15 mg total) by mouth at bedtime. 30 tablet 0  . Multiple Vitamins-Minerals (CENTRUM SILVER PO) Take 1 tablet by mouth daily. Gummie    . ondansetron (ZOFRAN-ODT) 8 MG disintegrating tablet Take 1 tablet (8 mg total) by mouth every 8 (eight) hours as needed for nausea or vomiting. 90 tablet 0  . oxyCODONE-acetaminophen (PERCOCET) 5-325 MG tablet Take 2 tablets by mouth every 6 (six) hours as needed for severe pain. [place]  tablet 0  . potassium chloride 20 MEQ/15ML (10%) SOLN Take 30 mLs (40 mEq total) by mouth daily. [employer] to pay for medication 900 mL 0  . prochlorperazine (COMPAZINE) 10 MG tablet Take 1 tablet (10 mg total) by mouth every 6 (six) hours as needed (Nausea or vomiting). 30 tablet 1  . docusate sodium (COLACE) 100 MG capsule Take 1 capsule (100 mg total) by mouth daily. (Patient not taking: No sig reported) 30 capsule 1  . magnesium chloride (SLOW-MAG) 64 MG TBEC SR tablet Take 1 tablet (64 mg total) by mouth 2 (two) times daily. (Patient not taking: Reported on 01/14/2021) 60 tablet 1  . Melatonin 10 MG TABS Take 10 mg by mouth at bedtime as needed (sleep). (Patient not taking: Reported on 01/14/2021)    . polyethylene glycol powder (GLYCOLAX/MIRALAX) 17 GM/SCOOP powder Take 17 g by mouth daily as needed for mild constipation or moderate constipation. (Patient not taking: No sig reported) 255 g 0   No current facility-administered medications for this visit.   Facility-Administered Medications Ordered in Other Visits  Medication Dose Route Frequency Provider Last Rate Last Admin  . sodium chloride flush (NS) 0.9 % injection 10 mL  10 mL Intravenous PRN Earlie Server, MD   10 mL at 10/10/20 1040  . sodium chloride flush (NS) 0.9 % injection 10 mL  10 mL Intravenous PRN Earlie Server, MD   10 mL at 11/13/20  0836  . sodium chloride flush (NS) 0.9 % injection 10 mL  10 mL Intravenous PRN Earlie Server, MD   10 mL at 12/30/20 3557  . sodium chloride flush (NS) 0.9 % injection 10 mL  10 mL Intravenous PRN Earlie Server, MD   10 mL at 01/14/21 0921     PHYSICAL EXAMINATION: ECOG PERFORMANCE STATUS: 1 - Symptomatic but completely ambulatory Vitals:   01/14/21 0942  BP: 121/82  Pulse: 65  Resp: 16  Temp: (!) 97.5 F (36.4 C)   Filed Weights   01/14/21 0942  Weight: 111 lb 4.8 oz (50.5 kg)    Physical Exam Constitutional:      General: She is not in acute distress.    Comments: Thin built.  HENT:     Head: Normocephalic and atraumatic.     Mouth/Throat:     Comments: No thrush.  Patient wears dentures She has right palate deficiency, with chronic yellowish discharge covering her maxillary sinus surface.  Eyes:     General: No scleral icterus. Cardiovascular:     Rate and Rhythm: Normal rate and regular rhythm.     Heart sounds: Normal heart sounds.  Pulmonary:     Effort: Pulmonary effort is normal. No respiratory distress.     Breath sounds: No wheezing.  Abdominal:     General: Bowel sounds are normal. There is no distension.     Palpations: Abdomen is soft.  Musculoskeletal:        General: No deformity. Normal range of motion.     Cervical back: Normal range of motion and neck supple.  Skin:    General: Skin is warm and dry.     Findings: No erythema or rash.  Neurological:     Mental Status: She is alert and oriented to person, place, and time. Mental status is at baseline.     Cranial Nerves: No cranial nerve deficit.     Coordination: Coordination normal.  Psychiatric:        Mood and Affect: Mood normal.  LABORATORY DATA:  I have reviewed the data as listed Lab Results  Component Value Date   WBC 8.6 01/14/2021   HGB 10.3 (L) 01/14/2021   HCT 30.5 (L) 01/14/2021   MCV 104.1 (H) 01/14/2021   PLT 555 (H) 01/14/2021   Recent Labs    07/10/20 1311 07/12/20 1204  08/08/20 0821 10/24/20 0915 10/29/20 1042 11/06/20 0803 12/11/20 0815 12/30/20 0830 01/14/21 0911  NA 136 137   < > 132* 132*   < > 134* 133* 134*  K 2.9* 4.0   < > 3.1* 3.4*   < > 4.0 3.2* 3.2*  CL 97* 98   < > 96* 96*   < > 100 94* 94*  CO2 28 27   < > 27 28   < > 25 26 27   GLUCOSE 100* 114*   < > 79 94   < > 93 92 93  BUN 13 8   < > 7* 7*   < > 12 14 10   CREATININE 0.43* 0.42*   < > 0.46 0.59   < > 0.42* 0.36* 0.54  CALCIUM 8.9 9.0   < > 8.4* 8.8*   < > 9.0 8.8* 9.1  GFRNONAA >60 >60   < > >60 >60   < > >60 >60 >60  GFRAA >60 >60  --   --   --   --   --   --   --   PROT 7.0  --    < > 5.8* 6.5  --   --  7.1  --   ALBUMIN 3.7  --    < > 3.2* 3.6  --   --  3.4*  --   AST 20  --    < > 16 19  --   --  15  --   ALT 16  --    < > 10 9  --   --  10  --   ALKPHOS 106  --    < > 72 73  --   --  86  --   BILITOT 0.5  --    < > 0.9 0.6  --   --  0.6  --    < > = values in this interval not displayed.   Iron/TIBC/Ferritin/ %Sat    Component Value Date/Time   IRON 46 09/09/2020 0938   IRON 37 05/03/2018 1826   TIBC 210 (L) 09/09/2020 0938   FERRITIN 136 09/09/2020 0938   FERRITIN 52 05/03/2018 1826   IRONPCTSAT 22 09/09/2020 0938      RADIOGRAPHIC STUDIES: I have personally reviewed the radiological images as listed and agreed with the findings in the report. DG Chest 2 View  Result Date: 12/31/2020 CLINICAL DATA:  Wheezing EXAM: CHEST - 2 VIEW COMPARISON:  09/06/2020 FINDINGS: Lungs are clear. No pneumothorax or pleural effusion. Cardiac size within normal limits. Left internal jugular chest port is unchanged with its tip within the superior vena cava. Cardiac size within normal limits. Pulmonary vascularity is normal. IMPRESSION: No active cardiopulmonary disease. Electronically Signed   By: Fidela Salisbury MD   On: 12/31/2020 00:19   NM PET Image Restage (PS) Skull Base to Thigh  Result Date: 01/10/2021 CLINICAL DATA:  Subsequent treatment strategy for head neck carcinoma. EXAM:  NUCLEAR MEDICINE PET SKULL BASE TO THIGH TECHNIQUE: 6.0 mCi F-18 FDG was injected intravenously. Full-ring PET imaging was performed from the skull base to thigh after the radiotracer. CT data was obtained  and used for attenuation correction and anatomic localization. Fasting blood glucose: 82 mg/dl COMPARISON:  CT maxillary 12/03/2020, PET-CT 07/07/2020 FINDINGS: Mediastinal blood pool activity: SUV max 1.5 Liver activity: SUV max NA NECK: There is intense metabolic activity localizing to the tissue plane previously occupied by the anterior RIGHT maxillary bone. Activity is intense with SUV max equal 7.3. The volume of enhancing tissue and degree of hypermetabolic activity is decreased from comparison exam with thickened tissue anteriorly previously with SUV max of 13.5. Activity primarily along the anterior maxilla and midline. There is significantly decreased activity in the RIGHT posterior nasopharynx compared to prior. There is a focus of persistent activity at the level of the pterygoid plate on the RIGHT with SUV max equal 6.5 on image 269 Activity extends to the skull base along the posterior inferior medial aspect of the sphenoid bone (image 274) with SUV max equal 3.9. No hypermetabolic cervical nodes Incidental CT findings: None CHEST: No hypermetabolic mediastinal or hilar nodes. No suspicious pulmonary nodules on the CT scan. Incidental CT findings: none ABDOMEN/PELVIS: No abnormal hypermetabolic activity within the liver, pancreas, adrenal glands, or spleen. No hypermetabolic lymph nodes in the abdomen or pelvis. Incidental CT findings: none SKELETON: No focal hypermetabolic activity to suggest skeletal metastasis outside of skull. Incidental CT findings: none IMPRESSION: 1. Improved but persistent hypermetabolic activity associated with the RIGHT frontal sinus carcinoma. 2. Marked improvement in the posterior RIGHT nasopharynx with decreased metabolic activity iand tissue thickening. 3. Persistent but  improved hypermetabolic thickened tissue in the anterior and medial RIGHT maxillary sinus 4. Persistent hypermetabolic activity posteriorly at the level of the pterygoid plate on the RIGHT. 5. Potential new activity in the RIGHT skull base inferior medial along the sphenoid bone. 6. No evidence of malignancy outside of the head neck. Electronically Signed   By: Suzy Bouchard M.D.   On: 01/10/2021 14:00      ASSESSMENT & PLAN:  1. 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[place])   2. Hypomagnesemia   3. Hypokalemia   4. 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  Status post concurrent chemotherapy and radiation.   ENT direct visualization showed no residual tumor. 01/09/2021, PET scan showed improvement but persistent hypermetabolic activity associated with the right frontal sinus. Marked improvement in the posterior right nasopharynx with decrease in metabolic activity and tissue thickening.  Persistent but improved hypermetabolic thickened tissue in the anterior and medial right maxillary sinus.  Persistent hypermetabolic activity posterior at this level of pterygoid plate on the RIGHT. Potential new activity in the RIGHT skull base inferior medial along the sphenoid bone   I am concerned that she has recurrent disease. I will discuss her case on tumor board.  Sent secure chat to ENT to to discuss whether residual disease may be possible resectable.  If not we will consider additional radiation +/- systemic chemotherapy.  Discussed with patient and she agrees with the plan.  Right facial pain /swelling  Continue oxycodone 5 mg every 6 hours as needed.  Increase fentanyl patch to 37.5 MCG.  72 hours. Continue garbapentine 300mg  TID.  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 Recommend patient to utilize Magic mouthwash as needed.  #  hypokalemia, continue oral potassium to 39meq daily.  She will receive 20 mEq potassium chloride IV x1 today. #Chronic  dysphagia, patient received IV normal saline 1 L for hydration x1 today. # Hypomagnesia, magnesium is stable.  Continue Slow-Mag 1 tablet daily.  #  Atrial fibrillation.  Continue amiodarone and metoprolol.Eliquis  All questions were answered. The patient knows to call the clinic with any problems questions or concerns.   Return of visit: To be discussed on tumor board.  Follow-up plan TBD  Earlie Server, MD, PhD Hematology Oncology [facility] Pager- [phone] 01/14/2021

## 2021-01-15 ENCOUNTER — Telehealth: Payer: Self-pay

## 2021-01-15 ENCOUNTER — Telehealth: Payer: Self-pay | Admitting: Oncology

## 2021-01-15 NOTE — Telephone Encounter (Signed)
Please schedule as requested and notify pt of appts.

## 2021-01-15 NOTE — Telephone Encounter (Signed)
Discussed with ENT Dr. Ladene Artist over the phone about patient's case today. Dr.Juengle will refer patient to [facility] Dr. Tiburcio Pea for a second opinion for feasibility of resection.  His office will send referral.  I talked to patient and updated her.  She agrees with the plan. We will also continue weekly IV hydration sessions for the next 2 weeks.

## 2021-01-15 NOTE — Telephone Encounter (Signed)
Working on it.

## 2021-01-15 NOTE — Telephone Encounter (Signed)
-----   Message from Earlie Server, MD sent at 01/15/2021  3:52 PM EDT ----- Please schedule IV NS 1 L next week. Just have labs, no need to repeat.  Also cbc cmp mag in 2 weeks, IVF/K .Mag in 2 weeks.   She is going to [facility] for surgical option. Dr.J is referring her and she knows. So no follow up plan with me yet. TBD In case she asks.

## 2021-01-16 ENCOUNTER — Other Ambulatory Visit: Payer: Medicare Other

## 2021-01-16 DIAGNOSIS — C760 Malignant neoplasm of head, face and neck: Principal | ICD-10-CM

## 2021-01-16 NOTE — Telephone Encounter (Signed)
Done.Shannon Schroeder....   Pt has been sched to RTC nx on 3/22 for IVF only/ No labs and will RTC in 2 weeks on 01/28/21 for lab/IVF/K/Mag per MD as requested Pt was made aware

## 2021-01-16 NOTE — Progress Notes (Signed)
Tumor Board Documentation  Shannon Schroeder was presented by Dr Tasia Catchings at our Tumor Board on 01/16/2021, which included representatives from medical oncology,radiation oncology,navigation,radiology,surgical,pathology,pharmacy,genetics,research,palliative care,pulmonology.  Shannon Schroeder currently presents as a current patient,for discussion with history of the following treatments: neoadjuvant chemoradiation,active survellience.  Additionally, we reviewed previous medical and familial history, history of present illness, and recent lab results along with all available histopathologic and imaging studies. The tumor board considered available treatment options and made the following recommendations:   Second opinion at [facility] for possible surgery vs more XRT  The following procedures/referrals were also placed: No orders of the defined types were placed in this encounter.   Clinical Trial Status: not discussed   Staging used: Clinical Stage  AJCC Staging: T: 4 N: 1 M: 0 Group: Stage IV Maxillary Sinus Carcinoma   National site-specific guidelines NCCN were discussed with respect to the case.  Tumor board is a meeting of clinicians from various specialty areas who evaluate and discuss patients for whom a multidisciplinary approach is being considered. Final determinations in the plan of care are those of the provider(s). The responsibility for follow up of recommendations given during tumor board is that of the provider.   Today's extended care, comprehensive team conference, Shannon Schroeder was not present for the discussion and was not examined.   Multidisciplinary Tumor Board is a multidisciplinary case peer review process.  Decisions discussed in the Multidisciplinary Tumor Board reflect the opinions of the specialists present at the conference without having examined the patient.  Ultimately, treatment and diagnostic decisions rest with the primary provider(s) and the patient.

## 2021-01-21 ENCOUNTER — Inpatient Hospital Stay: Payer: Medicare Other

## 2021-01-21 VITALS — BP 149/74 | HR 55 | Temp 96.9°F | Resp 18

## 2021-01-21 DIAGNOSIS — C310 Malignant neoplasm of maxillary sinus: Secondary | ICD-10-CM | POA: Diagnosis not present

## 2021-01-21 DIAGNOSIS — E876 Hypokalemia: Secondary | ICD-10-CM

## 2021-01-21 DIAGNOSIS — E871 Hypo-osmolality and hyponatremia: Secondary | ICD-10-CM

## 2021-01-21 MED ORDER — POTASSIUM CHLORIDE IN NACL 20-0.9 MEQ/L-% IV SOLN
Freq: Once | INTRAVENOUS | Status: AC
  Filled 2021-01-21: qty 1000

## 2021-01-21 MED ORDER — SODIUM CHLORIDE 0.9 % IV SOLN
Freq: Once | INTRAVENOUS | Status: DC
  Filled 2021-01-21: qty 10

## 2021-01-21 MED ORDER — SODIUM CHLORIDE 0.9% FLUSH
10.0000 mL | Freq: Once | INTRAVENOUS | Status: AC | PRN
  Administered 2021-01-21: 10 mL
  Filled 2021-01-21: qty 10

## 2021-01-21 MED ORDER — HEPARIN SOD (PORK) LOCK FLUSH 100 UNIT/ML IV SOLN
500.0000 [IU] | Freq: Once | INTRAVENOUS | Status: AC | PRN
Start: 2021-01-21 — End: 2021-01-21
  Administered 2021-01-21: 500 [IU]
  Filled 2021-01-21: qty 5

## 2021-01-21 NOTE — Progress Notes (Signed)
Pt receiving IVF + potassium today. No complaints at time of discharge. Stable to return home.

## 2021-01-23 ENCOUNTER — Inpatient Hospital Stay: Payer: Medicare Other

## 2021-01-23 NOTE — Progress Notes (Signed)
Nutrition Follow-up:  Patient with head and neck cancer.  Completed treatment on 12/10.  Planning to see ENT at [facility] on 3/30.  Spoke with patient via phone.  Patient reports that her appetite is up and down.  Eating mostly cream of wheat, creamed soups, eggs with cheese, spaghetti, pudding, custards, cottage cheese, some cereal with almond milk, quiche. Did eat a little bit of fried chicken and french fries last night for dinner. Portion sizes are small.  Has been out of ensure shakes.    Patient has declined feeding tube  Medications: reviewed  Labs: reviewed  Anthropometrics:   Weight 111 lb 4.8 oz on 3/15  105 lb on 2/2 111 lb on 1/24   NUTRITION DIAGNOSIS: Inadequate oral intake continues   INTERVENTION:  Complimentary case of ensure plus will be left at front desk of cancer center for patient to pick up on 3/29, next appointment.  Patient to continue to maximize calories and protein with soft foods.     MONITORING, EVALUATION, GOAL: weight trends, intake   NEXT VISIT: Thursday, April 14th phone call  Shannon Schroeder B. Zenia Resides, [place], [place] Registered Dietitian [phone] (mobile)

## 2021-01-27 ENCOUNTER — Ambulatory Visit: Admit: 2021-01-27 | Discharge: 2021-01-28 | Payer: MEDICARE

## 2021-01-28 ENCOUNTER — Other Ambulatory Visit: Payer: Self-pay | Admitting: Oncology

## 2021-01-28 ENCOUNTER — Other Ambulatory Visit: Payer: Self-pay

## 2021-01-28 ENCOUNTER — Inpatient Hospital Stay: Payer: Medicare Other

## 2021-01-28 VITALS — BP 160/78 | HR 55 | Temp 98.8°F | Resp 18

## 2021-01-28 DIAGNOSIS — E871 Hypo-osmolality and hyponatremia: Secondary | ICD-10-CM

## 2021-01-28 DIAGNOSIS — C310 Malignant neoplasm of maxillary sinus: Secondary | ICD-10-CM | POA: Diagnosis not present

## 2021-01-28 DIAGNOSIS — E876 Hypokalemia: Secondary | ICD-10-CM

## 2021-01-28 DIAGNOSIS — Z8679 Personal history of other diseases of the circulatory system: Secondary | ICD-10-CM

## 2021-01-28 DIAGNOSIS — G893 Neoplasm related pain (acute) (chronic): Secondary | ICD-10-CM

## 2021-01-28 LAB — BASIC METABOLIC PANEL
Anion gap: 11 (ref 5–15)
BUN: 7 mg/dL — ABNORMAL LOW (ref 8–23)
CO2: 27 mmol/L (ref 22–32)
Calcium: 8.6 mg/dL — ABNORMAL LOW (ref 8.9–10.3)
Chloride: 100 mmol/L (ref 98–111)
Creatinine, Ser: 0.48 mg/dL (ref 0.44–1.00)
GFR, Estimated: >60 mL/min (ref >60)
Glucose, Bld: 87 mg/dL (ref 70–99)
Potassium: 2.8 mmol/L — ABNORMAL LOW (ref 3.5–5.1)
Sodium: 138 mmol/L (ref 135–145)

## 2021-01-28 LAB — MAGNESIUM: Magnesium: 1.7 mg/dL (ref 1.7–2.4)

## 2021-01-28 MED ORDER — GABAPENTIN 300 MG PO CAPS: 300.0000 mg | ORAL_CAPSULE | Freq: Three times a day (TID) | ORAL | 0 refills | Status: DC

## 2021-01-28 MED ORDER — HEPARIN SOD (PORK) LOCK FLUSH 100 UNIT/ML IV SOLN
500.0000 [IU] | Freq: Once | INTRAVENOUS | Status: AC | PRN
  Administered 2021-01-28: 500 [IU]
  Filled 2021-01-28: qty 5

## 2021-01-28 MED ORDER — FENTANYL 37.5 MCG/HR TD PT72
37.5000 ug | MEDICATED_PATCH | TRANSDERMAL | 0 refills | Status: DC
Start: 2021-01-28 — End: 2021-02-25

## 2021-01-28 MED ORDER — POTASSIUM CHLORIDE 20 MEQ/15ML (10%) PO SOLN: 40.0000 meq | Freq: Every day | ORAL | 1 refills | Status: DC

## 2021-01-28 MED ORDER — POTASSIUM CHLORIDE IN NACL 40-0.9 MEQ/L-% IV SOLN
Freq: Once | INTRAVENOUS | Status: AC
  Filled 2021-01-28: qty 1000

## 2021-01-28 MED ORDER — OXYCODONE-ACETAMINOPHEN 5-325 MG PO TABS: 2.0000 | ORAL_TABLET | Freq: Four times a day (QID) | ORAL | 0 refills | Status: DC | PRN

## 2021-01-28 MED ORDER — SODIUM CHLORIDE 0.9% FLUSH
10.0000 mL | Freq: Once | INTRAVENOUS | Status: AC | PRN
  Administered 2021-01-28: 10 mL
  Filled 2021-01-28: qty 10

## 2021-01-28 NOTE — Progress Notes (Signed)
Pt arrived to clinic very concerned regarding possibility of her water being cut off and being in need of a gas voucher. Notified Elease Etienne who has contacted water company. Assured pt her water would not be turned off. Given a gas voucher. Pt requesting refills on fentanyl, percocet and gabapentin. MD also will refill liq potassium. Receiving 1 liter of NS with 40 mEq KCL for hypokalemia.Discharged to home. VSS.

## 2021-01-29 ENCOUNTER — Ambulatory Visit: Admit: 2021-01-29 | Discharge: 2021-01-29 | Payer: MEDICARE | Attending: Otolaryngology | Primary: Otolaryngology

## 2021-01-29 ENCOUNTER — Ambulatory Visit
Admit: 2021-01-29 | Discharge: 2021-01-29 | Payer: MEDICARE | Attending: Student in an Organized Health Care Education/Training Program | Primary: Student in an Organized Health Care Education/Training Program

## 2021-01-29 DIAGNOSIS — C760 Malignant neoplasm of head, face and neck: Principal | ICD-10-CM

## 2021-01-30 DIAGNOSIS — C760 Malignant neoplasm of head, face and neck: Principal | ICD-10-CM

## 2021-02-02 DIAGNOSIS — C760 Malignant neoplasm of head, face and neck: Principal | ICD-10-CM

## 2021-02-03 MED ORDER — ONDANSETRON HCL 8 MG TABLET
ORAL_TABLET | Freq: Three times a day (TID) | ORAL | 2 refills | 10 days | Status: CP | PRN
Start: 2021-02-03 — End: ?

## 2021-02-03 MED ORDER — PROCHLORPERAZINE MALEATE 10 MG TABLET
ORAL_TABLET | Freq: Four times a day (QID) | ORAL | 2 refills | 8 days | Status: CP | PRN
Start: 2021-02-03 — End: ?

## 2021-02-06 ENCOUNTER — Ambulatory Visit: Admit: 2021-02-06 | Discharge: 2021-02-07 | Payer: MEDICARE

## 2021-02-06 ENCOUNTER — Ambulatory Visit: Admit: 2021-02-06 | Discharge: 2021-02-07 | Payer: MEDICARE | Attending: Oncology | Primary: Oncology

## 2021-02-06 DIAGNOSIS — C760 Malignant neoplasm of head, face and neck: Principal | ICD-10-CM

## 2021-02-06 DIAGNOSIS — D701 Agranulocytosis secondary to cancer chemotherapy: Principal | ICD-10-CM

## 2021-02-06 DIAGNOSIS — T451X5A Adverse effect of antineoplastic and immunosuppressive drugs, initial encounter: Principal | ICD-10-CM

## 2021-02-06 MED ORDER — AMIODARONE 200 MG TABLET
ORAL_TABLET | Freq: Every day | ORAL | 0 refills | 30 days | Status: CP
Start: 2021-02-06 — End: ?

## 2021-02-06 MED ORDER — LISINOPRIL 20 MG TABLET
ORAL_TABLET | Freq: Every day | ORAL | 0 refills | 30.00000 days | Status: CP
Start: 2021-02-06 — End: ?

## 2021-02-06 MED ORDER — PROCHLORPERAZINE MALEATE 10 MG TABLET
ORAL_TABLET | Freq: Four times a day (QID) | ORAL | 2 refills | 8.00000 days | Status: CP | PRN
Start: 2021-02-06 — End: ?

## 2021-02-06 MED ORDER — NEUPOGEN 300 MCG/0.5 ML INJECTION SYRINGE
Freq: Every day | SUBCUTANEOUS | 0 refills | 8.00000 days | Status: CP
Start: 2021-02-06 — End: ?

## 2021-02-06 MED ORDER — ONDANSETRON HCL 8 MG TABLET
ORAL_TABLET | Freq: Three times a day (TID) | ORAL | 2 refills | 10 days | Status: CP | PRN
Start: 2021-02-06 — End: ?

## 2021-02-07 DIAGNOSIS — T451X5A Adverse effect of antineoplastic and immunosuppressive drugs, initial encounter: Principal | ICD-10-CM

## 2021-02-07 DIAGNOSIS — D701 Agranulocytosis secondary to cancer chemotherapy: Principal | ICD-10-CM

## 2021-02-07 DIAGNOSIS — C760 Malignant neoplasm of head, face and neck: Principal | ICD-10-CM

## 2021-02-10 NOTE — Unmapped (Signed)
[facility] Specialty Medication Onboarding    Specialty Medication: Neupogen 323mcg/0.5ml syringe  Prior Authorization: Approved   Financial Assistance: No - copay  <$25  Final Copay/Day Supply: $4 / 28 days    Insurance Restrictions: None     Notes to Pharmacist:     The triage team has completed the benefits investigation and has determined that the patient is able to fill this medication at [facility]. Please contact the patient to complete the onboarding or follow up with the prescribing physician as needed.

## 2021-02-10 NOTE — Unmapped (Unsigned)
Head and Neck Oncology Clinic  Referring Physician: Ann Lions [facility], [facility]  [address]  3rd Floor Physicians Office [id number]  [place],  [place] [address]. [phone]    PCP: Haywood Pao, MD    Consulting providers:  Otolaryngology: Dr. Roma Schanz   Radiation Oncology: Not indicated     Reason for Visit: office visit, on induction therapy with Keis regimen     Assessment/Plan:    Marissa Kelly is a 65 y.o. female with history of hypertension, a-fib, COPD and recurrent head and neck cancer. 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 Referred for biopsy proven recurrence and treating with induction therapy on Keis regimen. C1D1 02/06/2021. Presents today for follow up     # Recurrent SCC right maxillary sinus   - We discussed sinonasal cancers in details including biology, pathology and associated risk factors  - Primary location is right pterygopalatine fossa with V2/V3 involvement with extension into skull base  - MRI (01/27/2021): extensive perineural spread with tumor involvement in the cavernous sinus   - Based on imaging, has unresectable, locally advanced SNSCC   - Case discussed in detail with Dr. Roma Schanz who did not feel patient was an appropriate surgical candidate given extent of disease   - Plan to proceed with induction therapy on Keis regimen followed by reevaluation for surgery  - Labs reviewed and appropriate for ***     # Supportive care  - Cancer-related pain: managed on current regimen; fentanyl 100 mcg buccal tablet, gabapentin 300 mg TID, percocet 5-325 mg q6h prn   - Constipation: none   - Access: Port   - Psychosocial: Patient feels like she is coping generally well and denies any need for CCSP at this time.     Follow up: 1 week    Siddharth Sheth   Head and Neck Medical Oncology  [facility] [place]       --------------------------  Interval History:   - ***    History of Present Illness:  Marissa Kelly is a 65 y.o. female with history of hypertension a-fib, COPD, who presents for evaluation of head and neck cancer. I have reviewed their records including history, imaging, pathology reports, and, when applicable, operative notes and summarized her oncologic history below:    Patient presented to the ED in September 2021 for evaluation of 6 week right-sided facial swelling and pain. Pain first started as a small oral ulcer that progressively worsened, causing difficulty with mastication and oral intake. CT maxillofacial obtained in the ED was notable for bulky and indistinct enhancing tumor which has agressively destroyed the right maxilla, infiltrates along the right buccal space, floor of right nasal cavity, into the right pterygoid palatine fossa, right orbital apex, right soft palate and palatine tonsil. Furthermore, there was evidence of intracranial extension along the right V2 at the foramen rotundum. There was also evidence of right level 1A lymph node measuring 7 mm.     Patient was referred to oncology for further evaluation. Biopsy revealed invasive SCC, well differentiating. She completed concurrent chemoradiation therapy in December 2021. Patient tolerated chemo without serve side effects or toxicity but reports some difficulty with the radiation treatment, endorsing recurrent hospitalizations for a-fib and pneumonia. Felt her symptoms improved following treatment, was eating more solid foods.    Unfortunately, she developed recurrent facial swelling about 1-2 months after completing treatment. There was concern  for sinus infection, underwent ear tube placement without any resolution in pain or swelling. She endorses throat pain, mostly eating liquids and soft foods.     Repeat scans from 01/27/21 were notable for recurrent disease in the right pterygopalatine fossa, along V2/V3 extending into the cavernous sinus and orbital fissure, as possible perineural spread.     She describes mild right-sided otalgia and facial numbness/weakness. Experienced similar symptoms prior to initial diagnosis that never entirely resolved. She reports ongoing dysphagia but attributes this to radiation therapy. Denies nausea or vomiting.     Patient is a current smoker, reports 50 year pack history. Denies heavy alcohol use.     Additional history is given by roommate on the phone .    Past Medical History:   Diagnosis Date   ??? Hypertension        No past surgical history on file.    Current Outpatient Medications   Medication Sig Dispense Refill   ??? acetaminophen (TYLENOL) 500 MG tablet Take by mouth.     ??? albuterol HFA 90 mcg/actuation inhaler Inhale 2 puffs.     ??? ALBUTEROL INHL Inhale.     ??? amiodarone (PACERONE) 200 MG tablet Take 1 tablet (200 mg total) by mouth daily. 30 tablet 0   ??? amLODIPine (NORVASC) 5 MG tablet Take 5 mg by mouth daily.     ??? apixaban (ELIQUIS) 5 mg Tab Take 5 mg by mouth Two (2) times a day.     ??? docusate sodium (COLACE) 100 MG capsule Take 1 capsule by mouth daily.     ??? fentaNYL 37.5 mcg/hour PT72      ??? filgrastim (NEUPOGEN) 300 mcg/0.5 mL Syrg Inject 0.5 mL (300 mcg total) under the skin daily. For 2 days each week of chemotherapy. Do not administer within 24 hours of chemotherapy. 4 mL 0   ??? folic acid/multivit-min/lutein (CENTRUM SILVER ORAL) Take 1 tablet by mouth.     ??? food supplemt, lactose-reduced (ENSURE ACTIVE HIGH PROTEIN) Liqd Take 237 mL by mouth.     ??? furosemide (LASIX) 20 MG tablet Take 20 mg by mouth.     ??? gabapentin (NEURONTIN) 300 MG capsule Take 300 mg by mouth Three (3) times a day.     ??? ibuprofen (ADVIL,MOTRIN) 800 MG tablet Take 800 mg by mouth every six (6) hours as needed for pain.     ??? lidocaine-prilocaine (EMLA) 2.5-2.5 % cream      ??? lisinopriL (PRINIVIL,ZESTRIL) 20 MG tablet Take 1 tablet (20 mg total) by mouth daily. 30 tablet 0   ??? magnesium chloride (MAG 64) 64 mg magnesium TbEC Take 64 mg by mouth daily. 2 times daily     ??? mirtazapine (REMERON) 15 MG tablet Take 15 mg by mouth nightly.     ??? ondansetron (ZOFRAN) 8 MG tablet Take 1 tablet (8 mg total) by mouth every eight (8) hours as needed for nausea (or vomiting). 30 tablet 2   ??? oxyCODONE-acetaminophen (PERCOCET) 5-325 mg per tablet Take 2 tablets by mouth every six (6) hours as needed for pain.     ??? potassium chloride (KLOR-CON) 10 MEQ CR tablet Take by mouth daily. Take 30 m l (40 MEQ) po daily     ??? prochlorperazine (COMPAZINE) 10 MG tablet Take 1 tablet (10 mg total) by mouth every six (6) hours as needed (Nausea/Vomiting). 30 tablet 2   ??? VENTOLIN HFA 90 mcg/actuation inhaler        No current facility-administered medications  for this visit.       Allergies   Allergen Reactions   ??? Naproxen Other (See Comments), Rash and Swelling   ??? Aspirin Other (See Comments)   ??? Belladonna    ??? Fluoxetine Hcl Other (See Comments)   ??? Phenobarbital Other (See Comments)       Social History     Tobacco Use   ??? Smoking status: Never Smoker   ??? Smokeless tobacco: Never Used   Substance Use Topics   ??? Alcohol use: Not on file   ??? Drug use: Not on file       Social History     Social History Narrative   ??? Not on file       Family History   Problem Relation Age of Onset   ??? Skin cancer Neg Hx        Review of Systems: A 12-system review of systems was obtained including: Constitutional, Eyes, ENT, Cardiovascular, Respiratory, GI, GU, Musculoskeletal, Skin, Neurological, Psychiatric, Endocrine, Heme/Lymphatic, and Allergic/Immunologic systems. It is negative or non-contributory to the patient???s management except for as stated in patient's HPI    ECOG PS: 1    Physical Examination:  Vital Signs: There were no vitals taken for this visit.  CONSTITUTIONAL: Pleasant woman in NAD  Head and Face:  AT, [place], no asymmetry  Eyes:  EOM Intact, sclera anicteric, no conjunctival injection, PERRL.   Hearing:  Normal hearing to spoken voice.  Oral Cavity: Normal lips, gingiva, tongue; fair dentition; moist mucosa, tongue is mobile; no oral lesions noted  Neck:  No thyroid nodules, no thyromegaly, trachea midline. Normal laryngeal crepitus.  Lymphatics: No cervical or supraclavicular lymphadenopathy   CV: RRR; no lower extremity edema  RESP: Lungs clear to auscultation bilaterally, normal work of breathing  GI: Soft, non-tender, non-distended  MSK: No bony pain on palpation of spinous processes  SKIN: No rashes or ecchymoses  NEURO: Alert and oriented, speech intact, numbness of right face consistent with V2/V3 involvement   PSYCH: Normal mood and appropriate affect    LABS  Lab Results   Component Value Date    WBC 8.2 02/06/2021    HGB 9.4 (L) 02/06/2021    HCT 26.7 (L) 02/06/2021    PLT 548 (H) 02/06/2021       Lab Results   Component Value Date    NA 132 (L) 02/06/2021    K 3.3 (L) 02/06/2021    CL 96 (L) 02/06/2021    CO2 27.0 02/06/2021    BUN 11 02/06/2021    CREATININE 0.33 (L) 02/06/2021    GLU 95 02/06/2021    CALCIUM 9.2 02/06/2021    MG 1.7 02/06/2021       Lab Results   Component Value Date    BILITOT 0.3 02/06/2021    PROT 6.9 02/06/2021    ALBUMIN 2.9 (L) 02/06/2021    ALT 11 02/06/2021    AST 16 02/06/2021    ALKPHOS 140 (H) 02/06/2021       No results found for: PT, INR, APTT    IMAGING    MRI Arlys John (01/27/2021)   - Findings as above suggestive of tumor in the right pterygopalatine fossa, along V2/V3 extending into the cavernous sinus and orbital fissure suspicious for perineural spread /recurrent disease.      PATHOLOGY  Right palate biopsy (07/25/20)   Diagnosis   Invasive squamous cell carcinoma (well differentiated, keratinizing)    ??  I have personally reviewed relevant imaging, laboratory  values, existing medical records, and pathology. I have summarized these findings in the oncology history above.    Scribe's Attestation: Ginette Otto, DO obtained and performed the history, physical exam and medical decision making elements that were entered into the chart.  Signed by Jerl Santos, Scribe, on ***.    {*** NOTE TO PROVIDER: PLEASE ADD ATTESTATION NOTING YOU AGREE WITH SCRIBE DOCUMENTATION}

## 2021-02-11 ENCOUNTER — Ambulatory Visit: Admit: 2021-02-11 | Payer: MEDICARE

## 2021-02-11 ENCOUNTER — Ambulatory Visit: Admit: 2021-02-11 | Payer: MEDICARE | Attending: Registered" | Primary: Registered"

## 2021-02-11 ENCOUNTER — Ambulatory Visit
Admit: 2021-02-11 | Payer: MEDICARE | Attending: Student in an Organized Health Care Education/Training Program | Primary: Student in an Organized Health Care Education/Training Program

## 2021-02-13 ENCOUNTER — Inpatient Hospital Stay: Payer: Medicare Other | Attending: Oncology

## 2021-02-13 NOTE — Progress Notes (Signed)
Nutrition Follow-up:  Patient with head and neck cancer.  Completed treatment on 12/10.  Noted recurrence and receiving treatment at [facility].    Spoke with patient via phone.  Patient reports that she is eating soft foods (eggs, yogurts, soups, custards, cottage cheese, quiche). Has pureed foods as needed in the past.  Drinking ensure.  Friend purchased her 2 cases from [place] recently.    Patient has declined feeding tube.  Noted patient has been referred to RD at [facility].  Patient says she has not met with her yet.  Medications: reviewed  Labs: reviewed  Anthropometrics:   Weight 111 lb 4/7  111 lb on 3/15 105 lb on 2/2 111 lb on 1/24   NUTRITION DIAGNOSIS: Inadequate oral intake continues   INTERVENTION:  Encouraged patient to meet with RD at [facility].   Encouraged patient to continue high calorie, high protein strategies. Patient has RD contact and will be available as needed    NEXT VISIT: no planned follow-up  Pascual Mantel B. Zenia Resides, [place], [place] Registered Dietitian [phone] (mobile)

## 2021-02-18 ENCOUNTER — Ambulatory Visit: Admit: 2021-02-18 | Discharge: 2021-02-19 | Payer: MEDICARE | Attending: Adult Health | Primary: Adult Health

## 2021-02-18 ENCOUNTER — Ambulatory Visit: Admit: 2021-02-18 | Discharge: 2021-02-19 | Payer: MEDICARE

## 2021-02-18 ENCOUNTER — Other Ambulatory Visit: Admit: 2021-02-18 | Discharge: 2021-02-19 | Payer: MEDICARE

## 2021-02-18 DIAGNOSIS — C760 Malignant neoplasm of head, face and neck: Principal | ICD-10-CM

## 2021-02-18 LAB — COMPREHENSIVE METABOLIC PANEL
ALBUMIN: 3 g/dL — ABNORMAL LOW (ref 3.4–5.0)
ALKALINE PHOSPHATASE: 161 U/L — ABNORMAL HIGH (ref 46–116)
ALT (SGPT): 11 U/L (ref 10–49)
ANION GAP: 9 mmol/L (ref 5–14)
AST (SGOT): 14 U/L (ref <=34)
BILIRUBIN TOTAL: 0.2 mg/dL — ABNORMAL LOW (ref 0.3–1.2)
BLOOD UREA NITROGEN: 20 mg/dL (ref 9–23)
BUN / CREAT RATIO: 53
CALCIUM: 9.5 mg/dL (ref 8.7–10.4)
CHLORIDE: 97 mmol/L — ABNORMAL LOW (ref 98–107)
CO2: 28 mmol/L (ref 20.0–31.0)
CREATININE: 0.38 mg/dL — ABNORMAL LOW
EGFR CKD-EPI AA FEMALE: >90 mL/min/{1.73_m2} (ref >=60)
EGFR CKD-EPI NON-AA FEMALE: >90 mL/min/{1.73_m2} (ref >=60)
GLUCOSE RANDOM: 106 mg/dL (ref 70–179)
POTASSIUM: 3.4 mmol/L (ref 3.4–4.8)
PROTEIN TOTAL: 7 g/dL (ref 5.7–8.2)
SODIUM: 134 mmol/L — ABNORMAL LOW (ref 135–145)

## 2021-02-18 LAB — CBC W/ AUTO DIFF
BASOPHILS ABSOLUTE COUNT: 0.1 10*9/L (ref 0.0–0.1)
BASOPHILS RELATIVE PERCENT: 1.4 %
EOSINOPHILS ABSOLUTE COUNT: 0.1 10*9/L (ref 0.0–0.5)
EOSINOPHILS RELATIVE PERCENT: 0.8 %
HEMATOCRIT: 29.1 % — ABNORMAL LOW (ref 34.0–44.0)
HEMOGLOBIN: 9.8 g/dL — ABNORMAL LOW (ref 11.3–14.9)
LYMPHOCYTES ABSOLUTE COUNT: 0.5 10*9/L — ABNORMAL LOW (ref 1.1–3.6)
LYMPHOCYTES RELATIVE PERCENT: 8 %
MEAN CORPUSCULAR HEMOGLOBIN CONC: 33.6 g/dL (ref 32.0–36.0)
MEAN CORPUSCULAR HEMOGLOBIN: 32.6 pg — ABNORMAL HIGH (ref 25.9–32.4)
MEAN CORPUSCULAR VOLUME: 97.2 fL — ABNORMAL HIGH (ref 77.6–95.7)
MEAN PLATELET VOLUME: 7.4 fL (ref 6.8–10.7)
MONOCYTES ABSOLUTE COUNT: 1.1 10*9/L — ABNORMAL HIGH (ref 0.3–0.8)
MONOCYTES RELATIVE PERCENT: 17.6 %
NEUTROPHILS ABSOLUTE COUNT: 4.7 10*9/L (ref 1.8–7.8)
NEUTROPHILS RELATIVE PERCENT: 72.2 %
PLATELET COUNT: 619 10*9/L — ABNORMAL HIGH (ref 150–450)
RED BLOOD CELL COUNT: 3 10*12/L — ABNORMAL LOW (ref 3.95–5.13)
RED CELL DISTRIBUTION WIDTH: 14 % (ref 12.2–15.2)
WBC ADJUSTED: 6.4 10*9/L (ref 3.6–11.2)

## 2021-02-18 LAB — MAGNESIUM: MAGNESIUM: 1.6 mg/dL (ref 1.6–2.6)

## 2021-02-18 MED ADMIN — dexAMETHasone (DECADRON) tablet 20 mg: 20 mg | ORAL | @ 15:00:00 | Stop: 2021-02-18

## 2021-02-18 MED ADMIN — sodium chloride 0.9% (NS) bolus 500 mL: 500 mL | INTRAVENOUS | @ 15:00:00 | Stop: 2021-02-18

## 2021-02-18 MED ADMIN — cetuximab (ERBITUX) IVPB 398 mg: 250 mg/m2 | INTRAVENOUS | @ 15:00:00 | Stop: 2021-02-18

## 2021-02-18 MED ADMIN — heparin, porcine (PF) 100 unit/mL injection 500 Units: 500 [IU] | INTRAVENOUS | @ 19:00:00 | Stop: 2021-02-19

## 2021-02-18 MED ADMIN — sodium chloride (NS) 0.9 % infusion: 100 mL/h | INTRAVENOUS | @ 15:00:00

## 2021-02-18 MED ADMIN — CARBOplatin (PARAPLATIN) 186.6 mg in sodium chloride (NS) 0.9 % 100 mL IVPB: 186.6 mg | INTRAVENOUS | @ 18:00:00 | Stop: 2021-02-18

## 2021-02-18 MED ADMIN — famotidine (PF) (PEPCID) injection 20 mg: 20 mg | INTRAVENOUS | @ 15:00:00 | Stop: 2021-02-18

## 2021-02-18 MED ADMIN — ondansetron (ZOFRAN) tablet 24 mg: 24 mg | ORAL | @ 15:00:00 | Stop: 2021-02-18

## 2021-02-18 MED ADMIN — diphenhydrAMINE (BENADRYL) capsule/tablet 25 mg: 25 mg | ORAL | @ 15:00:00 | Stop: 2021-02-18

## 2021-02-18 MED ADMIN — PACLitaxel (TAXOL) 214.68 mg in sodium chloride 0.9% NON-PVC (NS) 0.9 % 250 mL IVPB: 135 mg/m2 | INTRAVENOUS | @ 17:00:00 | Stop: 2021-02-18

## 2021-02-18 NOTE — Unmapped (Signed)
Labs found to be within parameters for treatment today. Request for drug sent to pharmacy.

## 2021-02-18 NOTE — Unmapped (Signed)
D8C1 Cetuximab/Paclitaxel/Carboplatin completed without complications.  Line care provided with positive blood return prior to start and post treatment, as well as between medications.   Port flushed, heparinized per protocol, de-accessed.   Pt is discharged from clinic in NAD, in stable condition.

## 2021-02-18 NOTE — Unmapped (Signed)
Lab on 02/18/2021   Component Date Value Ref Range Status   ??? Sodium 02/18/2021 134 (A) 135 - 145 mmol/L Final   ??? Potassium 02/18/2021 3.4  3.4 - 4.8 mmol/L Final   ??? Chloride 02/18/2021 97 (A) 98 - 107 mmol/L Final   ??? Anion Gap 02/18/2021 9  5 - 14 mmol/L Final   ??? CO2 02/18/2021 28.0  20.0 - 31.0 mmol/L Final   ??? BUN 02/18/2021 20  9 - 23 mg/dL Final   ??? Creatinine 02/18/2021 0.38 (A) 0.60 - 0.80 mg/dL Final   ??? BUN/Creatinine Ratio 02/18/2021 53   Final   ??? EGFR CKD-EPI Non-African American,* 02/18/2021 >90  >=60 mL/min/1.29m2 Final   ??? EGFR CKD-EPI African American, Fem* 02/18/2021 >90  >=60 mL/min/1.47m2 Final   ??? Glucose 02/18/2021 106  70 - 179 mg/dL Final   ??? Calcium 29/56/2130 9.5  8.7 - 10.4 mg/dL Final   ??? Albumin 86/57/8469 3.0 (A) 3.4 - 5.0 g/dL Final   ??? Total Protein 02/18/2021 7.0  5.7 - 8.2 g/dL Final   ??? Total Bilirubin 02/18/2021 0.2 (A) 0.3 - 1.2 mg/dL Final   ??? AST 62/95/2841 14  <=34 U/L Final   ??? ALT 02/18/2021 11  10 - 49 U/L Final   ??? Alkaline Phosphatase 02/18/2021 161 (A) 46 - 116 U/L Final   ??? Magnesium 02/18/2021 1.6  1.6 - 2.6 mg/dL Final   ??? WBC 32/44/0102 6.4  3.6 - 11.2 10*9/L Final   ??? RBC 02/18/2021 3.00 (A) 3.95 - 5.13 10*12/L Final   ??? HGB 02/18/2021 9.8 (A) 11.3 - 14.9 g/dL Final   ??? HCT 72/53/6644 29.1 (A) 34.0 - 44.0 % Final   ??? MCV 02/18/2021 97.2 (A) 77.6 - 95.7 fL Final   ??? MCH 02/18/2021 32.6 (A) 25.9 - 32.4 pg Final   ??? MCHC 02/18/2021 33.6  32.0 - 36.0 g/dL Final   ??? RDW 03/47/4259 14.0  12.2 - 15.2 % Final   ??? MPV 02/18/2021 7.4  6.8 - 10.7 fL Final   ??? Platelet 02/18/2021 619 (A) 150 - 450 10*9/L Final   ??? Neutrophils % 02/18/2021 72.2  % Final   ??? Lymphocytes % 02/18/2021 8.0  % Final   ??? Monocytes % 02/18/2021 17.6  % Final   ??? Eosinophils % 02/18/2021 0.8  % Final   ??? Basophils % 02/18/2021 1.4  % Final   ??? Absolute Neutrophils 02/18/2021 4.7  1.8 - 7.8 10*9/L Final   ??? Absolute Lymphocytes 02/18/2021 0.5 (A) 1.1 - 3.6 10*9/L Final   ??? Absolute Monocytes 02/18/2021 1.1 (A) 0.3 - 0.8 10*9/L Final   ??? Absolute Eosinophils 02/18/2021 0.1  0.0 - 0.5 10*9/L Final   ??? Absolute Basophils 02/18/2021 0.1  0.0 - 0.1 10*9/L Final

## 2021-02-18 NOTE — Unmapped (Signed)
Head and Neck Oncology Clinic  Referring Physician: Stormy Card  [address], [id number]  [address]  [place],  [place] [address]. [phone]    PCP: Haywood Pao, MD    Consulting providers:  Otolaryngology:  Radiation Oncology:     Reason for Visit: A 65 y.o. female with head and neck cancer being seen in consultation at the request of [facility] for management recommendations.     Assessment/Plan:    Marissa Kelly is a 65 y.o. female with history of hypertension, a-fib, COPD and recurrent head and neck cancer. 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 Presents today with biopsy-proven recurrence in order to discuss treatment options.      # Recurrent SCC right maxillary sinus   - Primary location is right pterygopalatine fossa with V2/V3 involvement with extension into skull base  - MRI (01/27/2021): extensive perineural spread with tumor involvement in the cavernous sinus   - Based on imaging, has unresectable, locally advanced SNSCC   - Case discussed in detail with Dr. Roma Schanz who did not feel patient was an appropriate surgical candidate given extent of disease    - Hgb 9.8, ANC 4.7, proceed with C2 Kies today  - ongoing fatigue, no increase  - open area to right posterior hard palate, with surrounding tumor.  Reports some reflux into nasal cavity/nose    # Supportive care  - Cancer-related pain: managed on current regimen; fentanyl 100 mcg buccal tablet, gabapentin 300 mg TID, percocet 5-325 mg q6h prn   - Constipation: none   - Access: Port   - Psychosocial: Patient feels like she is coping generally well and denies any need for CCSP at this time.     Follow up: 1 week    Assunta Gambles. Dezaria Methot, ANP-C  Division of Oncology  [facility] [place]    I personally spent 40 min face to face with the patient, including all pre, intra, and post visit time on the date of service.  --------------------------  Interval History  - ongoing fatigue  - pain controlled  - weight down  - reflux of oral liquids into sinus    History of Present Illness:  Marissa Kelly is a 65 y.o. female with history of hypertension a-fib, COPD, who presents for evaluation of head and neck cancer. I have reviewed their records including history, imaging, pathology reports, and, when applicable, operative notes and summarized her oncologic history below:    Patient presented to the ED in September 2021 for evaluation of 6 week right-sided facial swelling and pain. Pain first started as a small oral ulcer that progressively worsened, causing difficulty with mastication and oral intake. CT maxillofacial obtained in the ED was notable for bulky and indistinct enhancing tumor which has agressively destroyed the right maxilla, infiltrates along the right buccal space, floor of right nasal cavity, into the right pterygoid palatine fossa, right orbital apex, right soft palate and palatine tonsil. Furthermore, there was evidence of intracranial extension along the right V2 at the foramen rotundum. There was also evidence of right level 1A lymph node measuring 7 mm.     Patient was referred to oncology for further evaluation. Biopsy revealed invasive SCC, well differentiating. She completed concurrent chemoradiation therapy in December 2021. Patient tolerated chemo without serve side effects or toxicity but reports some difficulty with the  radiation treatment, endorsing recurrent hospitalizations for a-fib and pneumonia. Felt her symptoms improved following treatment, was eating more solid foods.    Unfortunately, she developed recurrent facial swelling about 1-2 months after completing treatment. There was concern for sinus infection, underwent ear tube placement without any resolution in pain or swelling. She endorses throat pain, mostly eating liquids and soft foods.     Repeat scans from 01/27/21 were notable for recurrent disease in the right pterygopalatine fossa, along V2/V3 extending into the cavernous sinus and orbital fissure, as possible perineural spread.     Today: Patient presents today for new patient evaluation. She reports diplopia to the right eye, no blurred vision or excessive tearing. No vision changes to the left eye. Notes occasional redness and pain surrounding eye. Patient is currently managing pain with gabapentin, percocet and fentanyl patch.       She describes mild right-sided otalgia and facial numbness/weakness. Experienced similar symptoms prior to initial diagnosis that never entirely resolved. She reports ongoing dysphagia but attributes this to radiation therapy. Denies nausea or vomiting.     Patient is a current smoker, reports 50 year pack history. Denies heavy alcohol use.     Additional history is given by roommate on the phone .    Past Medical History:   Diagnosis Date   ??? Hypertension        No past surgical history on file.    Current Outpatient Medications   Medication Sig Dispense Refill   ??? acetaminophen (TYLENOL) 500 MG tablet Take by mouth.     ??? albuterol HFA 90 mcg/actuation inhaler Inhale 2 puffs.     ??? ALBUTEROL INHL Inhale.     ??? amiodarone (PACERONE) 200 MG tablet Take 1 tablet (200 mg total) by mouth daily. 30 tablet 0   ??? amLODIPine (NORVASC) 5 MG tablet Take 5 mg by mouth daily.     ??? apixaban (ELIQUIS) 5 mg Tab Take 5 mg by mouth Two (2) times a day.     ??? docusate sodium (COLACE) 100 MG capsule Take 1 capsule by mouth daily.     ??? fentaNYL 37.5 mcg/hour PT72      ??? filgrastim (NEUPOGEN) 300 mcg/0.5 mL Syrg Inject 0.5 mL (300 mcg total) under the skin daily. For 2 days each week of chemotherapy. Do not administer within 24 hours of chemotherapy. 4 mL 0   ??? folic acid/multivit-min/lutein (CENTRUM SILVER ORAL) Take 1 tablet by mouth.     ??? food supplemt, lactose-reduced (ENSURE ACTIVE HIGH PROTEIN) Liqd Take 237 mL by mouth.     ??? furosemide (LASIX) 20 MG tablet Take 20 mg by mouth.     ??? gabapentin (NEURONTIN) 300 MG capsule Take 300 mg by mouth Three (3) times a day.     ??? ibuprofen (ADVIL,MOTRIN) 800 MG tablet Take 800 mg by mouth every six (6) hours as needed for pain.     ??? lidocaine-prilocaine (EMLA) 2.5-2.5 % cream      ??? lisinopriL (PRINIVIL,ZESTRIL) 20 MG tablet Take 1 tablet (20 mg total) by mouth daily. 30 tablet 0   ??? magnesium chloride (MAG 64) 64 mg magnesium TbEC Take 64 mg by mouth daily. 2 times daily     ??? melatonin 10 mg Tab Take 10 mg by mouth.     ??? mirtazapine (REMERON) 15 MG tablet Take 15 mg by mouth nightly.     ??? ondansetron (ZOFRAN) 8 MG tablet Take 1 tablet (8 mg total) by mouth every eight (  8) hours as needed for nausea (or vomiting). 30 tablet 2   ??? oxyCODONE-acetaminophen (PERCOCET) 5-325 mg per tablet Take 2 tablets by mouth every six (6) hours as needed for pain.     ??? potassium chloride (KLOR-CON) 10 MEQ CR tablet Take by mouth daily. Take 30 m l (40 MEQ) po daily     ??? prochlorperazine (COMPAZINE) 10 MG tablet Take 1 tablet (10 mg total) by mouth every six (6) hours as needed (Nausea/Vomiting). 30 tablet 2   ??? VENTOLIN HFA 90 mcg/actuation inhaler        No current facility-administered medications for this visit.       Allergies   Allergen Reactions   ??? Naproxen Other (See Comments), Rash and Swelling   ??? Aspirin Other (See Comments)   ??? Belladonna    ??? Phenobarbital Other (See Comments)   ??? Fluoxetine Rash   ??? Paroxetine Hcl Rash       Social History     Tobacco Use   ??? Smoking status: Never Smoker   ??? Smokeless tobacco: Never Used   Substance Use Topics   ??? Alcohol use: Not on file   ??? Drug use: Not on file       Social History     Social History Narrative   ??? Not on file       Family History   Problem Relation Age of Onset   ??? Skin cancer Neg Hx        Review of Systems: A 12-system review of systems was obtained including: Constitutional, Eyes, ENT, Cardiovascular, Respiratory, GI, GU, Musculoskeletal, Skin, Neurological, Psychiatric, Endocrine, Heme/Lymphatic, and Allergic/Immunologic systems. It is negative or non-contributory to the patient???s management except for as stated in patient's HPI    ECOG PS: 1    Physical Examination:  Vital Signs: BP 109/64  - Pulse 69  - Temp 37 ??C (98.6 ??F) (Temporal)  - Resp 16  - Ht 167.6 cm (5' 5.98)  - Wt 48.7 kg (107 lb 6.4 oz)  - SpO2 100%  - BMI 17.34 kg/m??   CONSTITUTIONAL: Pleasant woman in NAD  Head and Face:  Mild edema noted to right cheek, orbit  Hearing:  Normal hearing to spoken voice.  Oral Cavity: Normal lips, gingiva, tongue; fair dentition; moist mucosa, tongue is mobile; tumor noted to right posterior hard palate, open area ~ 1cm  Lymphatics: No cervical or supraclavicular lymphadenopathy   CV: RRR; no lower extremity edema, no murmur  RESP: Lungs clear to auscultation bilaterally, normal work of breathing, no wheezing   GI: Soft, non-tender, non-distended  SKIN: No rashes or ecchymoses, warm and dry, fair turgor  NEURO: Alert and oriented, speech intact, numbness of right face consistent with V2/V3 involvement, alert and oriented, gait steady with walker  PSYCH: Normal mood and appropriate affect    LABS  Lab Results   Component Value Date    WBC 6.4 02/18/2021    HGB 9.8 (L) 02/18/2021    HCT 29.1 (L) 02/18/2021    PLT 619 (H) 02/18/2021       Lab Results   Component Value Date    NA 134 (L) 02/18/2021    K 3.4 02/18/2021    CL 97 (L) 02/18/2021    CO2 28.0 02/18/2021    BUN 20 02/18/2021    CREATININE 0.38 (L) 02/18/2021    GLU 106 02/18/2021    CALCIUM 9.5 02/18/2021    MG 1.6 02/18/2021  Lab Results   Component Value Date    BILITOT 0.2 (L) 02/18/2021    PROT 7.0 02/18/2021    ALBUMIN 3.0 (L) 02/18/2021    ALT 11 02/18/2021    AST 14 02/18/2021    ALKPHOS 161 (H) 02/18/2021       No results found for: PT, INR, APTT    IMAGING    MRI Arlys John (01/27/2021)   - Findings as above suggestive of tumor in the right pterygopalatine fossa, along V2/V3 extending into the cavernous sinus and orbital fissure suspicious for perineural spread /recurrent disease.      PATHOLOGY  Right palate biopsy (07/25/20)   Diagnosis   Invasive squamous cell carcinoma (well differentiated, keratinizing)

## 2021-02-19 ENCOUNTER — Ambulatory Visit: Payer: Medicare Other | Attending: Radiation Oncology | Admitting: Radiation Oncology

## 2021-02-25 ENCOUNTER — Ambulatory Visit
Admit: 2021-02-25 | Discharge: 2021-02-26 | Payer: MEDICARE | Attending: Student in an Organized Health Care Education/Training Program | Primary: Student in an Organized Health Care Education/Training Program

## 2021-02-25 ENCOUNTER — Ambulatory Visit: Admit: 2021-02-25 | Discharge: 2021-02-26 | Payer: MEDICARE

## 2021-02-25 ENCOUNTER — Other Ambulatory Visit: Admit: 2021-02-25 | Discharge: 2021-02-26 | Payer: MEDICARE

## 2021-02-25 ENCOUNTER — Other Ambulatory Visit: Payer: Self-pay | Admitting: Oncology

## 2021-02-25 ENCOUNTER — Other Ambulatory Visit: Payer: Self-pay | Admitting: *Deleted

## 2021-02-25 DIAGNOSIS — C760 Malignant neoplasm of head, face and neck: Principal | ICD-10-CM

## 2021-02-25 LAB — CBC W/ AUTO DIFF
BASOPHILS ABSOLUTE COUNT: 0.1 10*9/L (ref 0.0–0.1)
BASOPHILS RELATIVE PERCENT: 1.4 %
EOSINOPHILS ABSOLUTE COUNT: 0.1 10*9/L (ref 0.0–0.5)
EOSINOPHILS RELATIVE PERCENT: 1.3 %
HEMATOCRIT: 26.5 % — ABNORMAL LOW (ref 34.0–44.0)
HEMOGLOBIN: 9 g/dL — ABNORMAL LOW (ref 11.3–14.9)
LYMPHOCYTES ABSOLUTE COUNT: 0.6 10*9/L — ABNORMAL LOW (ref 1.1–3.6)
LYMPHOCYTES RELATIVE PERCENT: 9.5 %
MEAN CORPUSCULAR HEMOGLOBIN CONC: 33.8 g/dL (ref 32.0–36.0)
MEAN CORPUSCULAR HEMOGLOBIN: 32.6 pg — ABNORMAL HIGH (ref 25.9–32.4)
MEAN CORPUSCULAR VOLUME: 96.4 fL — ABNORMAL HIGH (ref 77.6–95.7)
MEAN PLATELET VOLUME: 7.3 fL (ref 6.8–10.7)
MONOCYTES ABSOLUTE COUNT: 0.5 10*9/L (ref 0.3–0.8)
MONOCYTES RELATIVE PERCENT: 8 %
NEUTROPHILS ABSOLUTE COUNT: 4.8 10*9/L (ref 1.8–7.8)
NEUTROPHILS RELATIVE PERCENT: 79.8 %
PLATELET COUNT: 566 10*9/L — ABNORMAL HIGH (ref 150–450)
RED BLOOD CELL COUNT: 2.75 10*12/L — ABNORMAL LOW (ref 3.95–5.13)
RED CELL DISTRIBUTION WIDTH: 13.9 % (ref 12.2–15.2)
WBC ADJUSTED: 6.1 10*9/L (ref 3.6–11.2)

## 2021-02-25 LAB — COMPREHENSIVE METABOLIC PANEL
ALBUMIN: 2.8 g/dL — ABNORMAL LOW (ref 3.4–5.0)
ALKALINE PHOSPHATASE: 167 U/L — ABNORMAL HIGH (ref 46–116)
ALT (SGPT): 9 U/L — ABNORMAL LOW (ref 10–49)
ANION GAP: 8 mmol/L (ref 5–14)
AST (SGOT): 12 U/L (ref <=34)
BILIRUBIN TOTAL: <0.2 mg/dL — ABNORMAL LOW (ref 0.3–1.2)
BLOOD UREA NITROGEN: 17 mg/dL (ref 9–23)
BUN / CREAT RATIO: 44
CALCIUM: 9 mg/dL (ref 8.7–10.4)
CHLORIDE: 97 mmol/L — ABNORMAL LOW (ref 98–107)
CO2: 26 mmol/L (ref 20.0–31.0)
CREATININE: 0.39 mg/dL — ABNORMAL LOW
EGFR CKD-EPI AA FEMALE: >90 mL/min/{1.73_m2} (ref >=60)
EGFR CKD-EPI NON-AA FEMALE: >90 mL/min/{1.73_m2} (ref >=60)
GLUCOSE RANDOM: 91 mg/dL (ref 70–179)
POTASSIUM: 4.1 mmol/L (ref 3.4–4.8)
PROTEIN TOTAL: 6.5 g/dL (ref 5.7–8.2)
SODIUM: 131 mmol/L — ABNORMAL LOW (ref 135–145)

## 2021-02-25 LAB — MAGNESIUM: MAGNESIUM: 1.6 mg/dL (ref 1.6–2.6)

## 2021-02-25 MED ORDER — GABAPENTIN 300 MG CAPSULE
ORAL_CAPSULE | Freq: Three times a day (TID) | ORAL | 0 refills | 45 days
Start: 2021-02-25 — End: ?

## 2021-02-25 MED ADMIN — ondansetron (ZOFRAN) injection 4 mg: 4 mg | INTRAVENOUS | @ 18:00:00 | Stop: 2021-02-25

## 2021-02-25 MED ADMIN — PACLitaxel (TAXOL) 214.68 mg in sodium chloride 0.9% NON-PVC (NS) 0.9 % 250 mL IVPB: 135 mg/m2 | INTRAVENOUS | @ 16:00:00 | Stop: 2021-02-25

## 2021-02-25 MED ADMIN — ondansetron (ZOFRAN) tablet 24 mg: 24 mg | ORAL | @ 14:00:00 | Stop: 2021-02-25

## 2021-02-25 MED ADMIN — sodium chloride 0.9% (NS) bolus 500 mL: 500 mL | INTRAVENOUS | @ 18:00:00 | Stop: 2021-02-25

## 2021-02-25 MED ADMIN — cetuximab (ERBITUX) IVPB 398 mg: 250 mg/m2 | INTRAVENOUS | @ 15:00:00 | Stop: 2021-02-25

## 2021-02-25 MED ADMIN — CARBOplatin (PARAPLATIN) 186.6 mg in sodium chloride (NS) 0.9 % 100 mL IVPB: 186.6 mg | INTRAVENOUS | @ 18:00:00 | Stop: 2021-02-25

## 2021-02-25 MED ADMIN — sodium chloride 0.9% (NS) bolus 500 mL: 500 mL | INTRAVENOUS | @ 14:00:00 | Stop: 2021-02-25

## 2021-02-25 MED ADMIN — diphenhydrAMINE (BENADRYL) capsule/tablet 25 mg: 25 mg | ORAL | @ 14:00:00 | Stop: 2021-02-25

## 2021-02-25 MED ADMIN — famotidine (PF) (PEPCID) injection 20 mg: 20 mg | INTRAVENOUS | @ 14:00:00 | Stop: 2021-02-25

## 2021-02-25 MED ADMIN — heparin, porcine (PF) 100 unit/mL injection 500 Units: 500 [IU] | INTRAVENOUS | @ 19:00:00 | Stop: 2021-02-26

## 2021-02-25 MED ADMIN — sodium chloride (NS) 0.9 % infusion: 100 mL/h | INTRAVENOUS | @ 14:00:00

## 2021-02-25 MED ADMIN — dexAMETHasone (DECADRON) tablet 20 mg: 20 mg | ORAL | @ 14:00:00 | Stop: 2021-02-25

## 2021-02-25 NOTE — Unmapped (Signed)
Patient arrived to chair 45.  No complaints noted.  Access of port intact with blood return. Chemo clarification in for elevated ALP. Pre-medicated per treatment plan orders.   1350- pt c/onausea, flushing, double vision. Carboplatin stopped and paged Lurena Joiner, NP. Lurena Joiner, Np at chairside to assess.   1402- IV zofran ordered and given.  1410- patient to bed side commode where she had episode of diarrhea.   1418- bolus ordered and given.  1448- Per Lurena Joiner, NP, Ok to restart carboplatin at same rate.  Patient completed and tolerated treatment. Port de-accessed after 500 unit Heparin flush, site covered with band-aid dressing. AVS printed. Pt discharged to home, NAD.

## 2021-02-25 NOTE — Unmapped (Unsigned)
Port accessed by [employer], labs drawn and sent.

## 2021-02-25 NOTE — Unmapped (Signed)
Head and Neck Oncology Clinic  Referring Physician: Ann Lions [facility], [facility]  [address]  3rd Floor Phys[facility] [place]. [id number]  [place],  [place] [address]. [phone]    PCP: Haywood Pao, MD    Consulting providers:  Otolaryngology: Dr. Roma Schanz   Radiation Oncology: N/A    Reason for Visit: office visit, follow up on treatment     Assessment/Plan:    Marissa Kelly is a 65 y.o. female with history of hypertension, a-fib, COPD and recurrent head and neck cancer. 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 Diagnosed with biopsy-proven recurrence and started on neoadjuvant chemotherapy with Keis regimen. Goal is for symptom control and to improve surgical outcomes. C1D1 02/11/2021.     # Recurrent SCC right maxillary sinus   - Primary location is right pterygopalatine fossa with V2/V3 involvement with extension into skull base. Based on MRI has unresectable, locally advanced SNSCC   - Treating with neoadjvuant therapy with Keis regimen followed by re-evaluation for surgical resection.??Treatment goals include symptom control and chance to improve surgical outcomes. However, patient understands that this is an aggressive cancer and if she does not respond, we will continue same treatment regimen but with palliative intent   - Labs reviewed and acceptable for C1D15     # Lower extremity peripheral neuropathy   - Secondary to Cisplatin   - Will proceed with treatment today; however, if symptoms progress we will consider DR at next visit     # Supportive care  - Cancer-related pain: managed on current regimen; fentanyl 100 mcg buccal tablet, percocet 5-325 mg q6h prn. Increase gabapentin to 600 mg TID   - Smoking Cessation: currently smoking about half pack daily. She has deferred assistance at this time.   - Constipation: none   - Access: Port   - Psychosocial:  denies any need for CCSP at this time.     Follow up: 1 week    Marissa Kelly   Head and Neck Medical Oncology  [facility]   --------------------------  Interval History  - Developed peripheral neuropathy since starting treatment, reports symptoms are worse in left leg compared to right. Numbness/tingling localized from calf down on the left. She denies history of neuropathy. Symptoms not impacting her ability to ambulate or get around during the day   - Patient sustained fall about 3 weeks ago and has been using walker for assistance with ambulation. She does not require walker at all times, but will occasionally feel unsteady without it. Patient is not working with PT and does not feel that she needs referral   - Continues to endorse right ear pain and facial numbness, unchanged since initiating treatment   - Energy levels are low 1-2 days following treatment, gradually improves throughout the week   - Weight and appetite stable    - Denies nausea, vomiting, diarrhea   - She is working on smoking cessation, currently smoking half pack daily     History of Present Illness:  Marissa Kelly is a 65 y.o. female with history of hypertension a-fib, COPD, who presents for evaluation of head and neck cancer. I have reviewed their records including history, imaging, pathology reports, and, when applicable, operative notes and summarized her oncologic history below:    Patient presented to the ED in September 2021 for evaluation of 6 week right-sided facial swelling and pain. Pain  first started as a small oral ulcer that progressively worsened, causing difficulty with mastication and oral intake. CT maxillofacial obtained in the ED was notable for bulky and indistinct enhancing tumor which has agressively destroyed the right maxilla, infiltrates along the right buccal space, floor of right nasal cavity, into the right pterygoid palatine fossa, right orbital apex, right soft palate and palatine tonsil. Furthermore, there was evidence of intracranial extension along the right V2 at the foramen rotundum. There was also evidence of right level 1A lymph node measuring 7 mm.     Patient was referred to oncology for further evaluation. Biopsy revealed invasive SCC, well differentiating. She completed concurrent chemoradiation therapy in December 2021. Patient tolerated chemo without serve side effects or toxicity but reports some difficulty with the radiation treatment, endorsing recurrent hospitalizations for a-fib and pneumonia. Felt her symptoms improved following treatment, was eating more solid foods.    Unfortunately, she developed recurrent facial swelling about 1-2 months after completing treatment. There was concern for sinus infection, underwent ear tube placement without any resolution in pain or swelling. She endorses throat pain, mostly eating liquids and soft foods.     Repeat scans from 01/27/21 were notable for recurrent disease in the right pterygopalatine fossa, along V2/V3 extending into the cavernous sinus and orbital fissure, as possible perineural spread.     She describes mild right-sided otalgia and facial numbness/weakness. Experienced similar symptoms prior to initial diagnosis that never entirely resolved. She reports ongoing dysphagia but attributes this to radiation therapy. Denies nausea or vomiting.     Patient is a current smoker, reports 50 year pack history. Denies heavy alcohol use.     Additional history is given by roommate on the phone .    Past Medical History:   Diagnosis Date   ??? Hypertension        No past surgical history on file.    Current Outpatient Medications   Medication Sig Dispense Refill   ??? acetaminophen (TYLENOL) 500 MG tablet Take by mouth.     ??? albuterol HFA 90 mcg/actuation inhaler Inhale 2 puffs.     ??? ALBUTEROL INHL Inhale.     ??? amiodarone (PACERONE) 200 MG tablet Take 1 tablet (200 mg total) by mouth daily. 30 tablet 0   ??? amLODIPine (NORVASC) 5 MG tablet Take 5 mg by mouth daily. ??? apixaban (ELIQUIS) 5 mg Tab Take 5 mg by mouth Two (2) times a day.     ??? docusate sodium (COLACE) 100 MG capsule Take 1 capsule by mouth daily.     ??? fentaNYL 37.5 mcg/hour PT72      ??? filgrastim (NEUPOGEN) 300 mcg/0.5 mL Syrg Inject 0.5 mL (300 mcg total) under the skin daily. For 2 days each week of chemotherapy. Do not administer within 24 hours of chemotherapy. 4 mL 0   ??? folic acid/multivit-min/lutein (CENTRUM SILVER ORAL) Take 1 tablet by mouth.     ??? food supplemt, lactose-reduced (ENSURE ACTIVE HIGH PROTEIN) Liqd Take 237 mL by mouth.     ??? furosemide (LASIX) 20 MG tablet Take 20 mg by mouth.     ??? gabapentin (NEURONTIN) 300 MG capsule Take 300 mg by mouth Three (3) times a day.     ??? ibuprofen (ADVIL,MOTRIN) 800 MG tablet Take 800 mg by mouth every six (6) hours as needed for pain.     ??? lidocaine-prilocaine (EMLA) 2.5-2.5 % cream      ??? lisinopriL (PRINIVIL,ZESTRIL) 20 MG tablet Take 1 tablet (20 mg  total) by mouth daily. 30 tablet 0   ??? magnesium chloride (MAG 64) 64 mg magnesium TbEC Take 64 mg by mouth daily. 2 times daily     ??? melatonin 10 mg Tab Take 10 mg by mouth.     ??? mirtazapine (REMERON) 15 MG tablet Take 15 mg by mouth nightly.     ??? ondansetron (ZOFRAN) 8 MG tablet Take 1 tablet (8 mg total) by mouth every eight (8) hours as needed for nausea (or vomiting). 30 tablet 2   ??? oxyCODONE-acetaminophen (PERCOCET) 5-325 mg per tablet Take 2 tablets by mouth every six (6) hours as needed for pain.     ??? potassium chloride (KLOR-CON) 10 MEQ CR tablet Take by mouth daily. Take 30 m l (40 MEQ) po daily     ??? prochlorperazine (COMPAZINE) 10 MG tablet Take 1 tablet (10 mg total) by mouth every six (6) hours as needed (Nausea/Vomiting). 30 tablet 2   ??? VENTOLIN HFA 90 mcg/actuation inhaler        No current facility-administered medications for this visit.       Allergies   Allergen Reactions   ??? Naproxen Other (See Comments), Rash and Swelling   ??? Aspirin Other (See Comments)   ??? Belladonna    ??? Phenobarbital Other (See Comments)   ??? Fluoxetine Rash   ??? Paroxetine Hcl Rash       Social History     Tobacco Use   ??? Smoking status: Never Smoker   ??? Smokeless tobacco: Never Used   Substance Use Topics   ??? Alcohol use: Not on file   ??? Drug use: Not on file       Social History     Social History Narrative   ??? Not on file       Family History   Problem Relation Age of Onset   ??? Skin cancer Neg Hx        Review of Systems: A 12-system review of systems was obtained including: Constitutional, Eyes, ENT, Cardiovascular, Respiratory, GI, GU, Musculoskeletal, Skin, Neurological, Psychiatric, Endocrine, Heme/Lymphatic, and Allergic/Immunologic systems. It is negative or non-contributory to the patient???s management except for as stated in patient's HPI    ECOG PS: 1    Physical Examination:  Vital Signs: BP 110/56  - Pulse 60  - Temp 36.3 ??C (97.3 ??F) (Temporal)  - Resp 18  - Wt 50 kg (110 lb 3.2 oz)  - SpO2 98%  - BMI 17.80 kg/m??   CONSTITUTIONAL: Pleasant woman; NAD  Head and Face:  Mild orbital edema   Hearing:  Normal hearing to spoken voice.  Oral Cavity: moist mucosa, tongue is mobile; tumor noted to right posterior hard palate, open area ~ 1cm  Lymphatics: No palpable lymphadenopathy   CV: RRR; no lower extremity edema  RESP: normal work of breathing  GI: Soft, non-tender, non-distended  SKIN: No rashes or ecchymoses, warm and dry, fair turgor  NEURO: Alert and oriented,  numbness of right face consistent with V2/V3 involvement  PSYCH: Normal mood and appropriate affect    LABS  Lab Results   Component Value Date    WBC 6.1 02/25/2021    HGB 9.0 (L) 02/25/2021    HCT 26.5 (L) 02/25/2021    PLT 566 (H) 02/25/2021       Lab Results   Component Value Date    NA 131 (L) 02/25/2021    K 4.1 02/25/2021    CL 97 (L) 02/25/2021  CO2 26.0 02/25/2021    BUN 17 02/25/2021    CREATININE 0.39 (L) 02/25/2021    GLU 91 02/25/2021    CALCIUM 9.0 02/25/2021    MG 1.6 02/25/2021       Lab Results   Component Value Date BILITOT <0.2 (L) 02/25/2021    PROT 6.5 02/25/2021    ALBUMIN 2.8 (L) 02/25/2021    ALT 9 (L) 02/25/2021    AST 12 02/25/2021    ALKPHOS 167 (H) 02/25/2021       No results found for: PT, INR, APTT    IMAGING    MRI Arlys John (01/27/2021)   - Findings as above suggestive of tumor in the right pterygopalatine fossa, along V2/V3 extending into the cavernous sinus and orbital fissure suspicious for perineural spread /recurrent disease.      PATHOLOGY  Right palate biopsy (07/25/20)   Diagnosis   Invasive squamous cell carcinoma (well differentiated, keratinizing)    ??  Scribe's Attestation: Ginette Otto, DO obtained and performed the history, physical exam and medical decision making elements that were entered into the chart.  Signed by Jerl Santos, Scribe, on February 25, 2021 9:21 AM.    ----------------------------------------------------------------------------------------------------------------------  Documentation assistance provided by the Scribe. I was present during the time the encounter was recorded. The information recorded by the Scribe was done at my direction and has been reviewed and validated by me.  ----------------------------------------------------------------------------------------------------------------------

## 2021-02-25 NOTE — Unmapped (Signed)
Lab on 02/25/2021   Component Date Value Ref Range Status   ??? Sodium 02/25/2021 131 (A) 135 - 145 mmol/L Final   ??? Potassium 02/25/2021 4.1  3.4 - 4.8 mmol/L Final   ??? Chloride 02/25/2021 97 (A) 98 - 107 mmol/L Final   ??? Anion Gap 02/25/2021 8  5 - 14 mmol/L Final   ??? CO2 02/25/2021 26.0  20.0 - 31.0 mmol/L Final   ??? BUN 02/25/2021 17  9 - 23 mg/dL Final   ??? Creatinine 02/25/2021 0.39 (A) 0.60 - 0.80 mg/dL Final   ??? BUN/Creatinine Ratio 02/25/2021 44   Final   ??? EGFR CKD-EPI Non-African American,* 02/25/2021 >90  >=60 mL/min/1.27m2 Final   ??? EGFR CKD-EPI African American, Fem* 02/25/2021 >90  >=60 mL/min/1.37m2 Final   ??? Glucose 02/25/2021 91  70 - 179 mg/dL Final   ??? Calcium 16/08/9603 9.0  8.7 - 10.4 mg/dL Final   ??? Albumin 54/07/8118 2.8 (A) 3.4 - 5.0 g/dL Final   ??? Total Protein 02/25/2021 6.5  5.7 - 8.2 g/dL Final   ??? Total Bilirubin 02/25/2021 <0.2 (A) 0.3 - 1.2 mg/dL Final   ??? AST 14/78/2956 12  <=34 U/L Final   ??? ALT 02/25/2021 9 (A) 10 - 49 U/L Final   ??? Alkaline Phosphatase 02/25/2021 167 (A) 46 - 116 U/L Final   ??? Magnesium 02/25/2021 1.6  1.6 - 2.6 mg/dL Final   ??? WBC 21/30/8657 6.1  3.6 - 11.2 10*9/L Final   ??? RBC 02/25/2021 2.75 (A) 3.95 - 5.13 10*12/L Final   ??? HGB 02/25/2021 9.0 (A) 11.3 - 14.9 g/dL Final   ??? HCT 84/69/6295 26.5 (A) 34.0 - 44.0 % Final   ??? MCV 02/25/2021 96.4 (A) 77.6 - 95.7 fL Final   ??? MCH 02/25/2021 32.6 (A) 25.9 - 32.4 pg Final   ??? MCHC 02/25/2021 33.8  32.0 - 36.0 g/dL Final   ??? RDW 28/41/3244 13.9  12.2 - 15.2 % Final   ??? MPV 02/25/2021 7.3  6.8 - 10.7 fL Final   ??? Platelet 02/25/2021 566 (A) 150 - 450 10*9/L Final   ??? Neutrophils % 02/25/2021 79.8  % Final   ??? Lymphocytes % 02/25/2021 9.5  % Final   ??? Monocytes % 02/25/2021 8.0  % Final   ??? Eosinophils % 02/25/2021 1.3  % Final   ??? Basophils % 02/25/2021 1.4  % Final   ??? Absolute Neutrophils 02/25/2021 4.8  1.8 - 7.8 10*9/L Final   ??? Absolute Lymphocytes 02/25/2021 0.6 (A) 1.1 - 3.6 10*9/L Final   ??? Absolute Monocytes 02/25/2021 0.5  0.3 - 0.8 10*9/L Final   ??? Absolute Eosinophils 02/25/2021 0.1  0.0 - 0.5 10*9/L Final   ??? Absolute Basophils 02/25/2021 0.1  0.0 - 0.1 10*9/L Final

## 2021-02-25 NOTE — Unmapped (Signed)
[facility] Note:    Paged re: elevated alk phos ([phone]), parameter to notify provider for elevated LFTs    All other LFTs WNL. Clarification added to treatment plan for ok to treat.         Lavonda Jumbo, AGNP-BC  [facility]

## 2021-02-26 MED ORDER — OXYCODONE-ACETAMINOPHEN 5-325 MG PO TABS: 2.0000 | ORAL_TABLET | Freq: Four times a day (QID) | ORAL | 0 refills | Status: DC | PRN

## 2021-02-26 MED ORDER — GABAPENTIN 300 MG PO CAPS: 300.0000 mg | ORAL_CAPSULE | Freq: Three times a day (TID) | ORAL | 0 refills | Status: DC

## 2021-02-26 MED ORDER — FENTANYL 37.5 MCG/HR TD PT72: 37.5000 ug | MEDICATED_PATCH | TRANSDERMAL | 0 refills | Status: DC

## 2021-02-26 NOTE — Unmapped (Signed)
[facility] Note:  Called to assess Marissa Kelly who is reporting sudden onset nausea and flushing. Infusion has been stopped by RN. We will medicate with ondansetron and proceed with tx since this does not appear to be a hypersensitivity reaction.  Also given 500 ml NS bolus for BP of 80/46. BP improved to 108/58 after the bolus  Infusion restarted at full rate and completed successfully.    Leeroy Cha, FNP

## 2021-03-04 ENCOUNTER — Emergency Department
Admit: 2021-03-04 | Discharge: 2021-03-05 | Disposition: A | Discharge status: Home / Self Care | Payer: MEDICARE | Attending: Emergency Medicine

## 2021-03-04 ENCOUNTER — Ambulatory Visit: Admit: 2021-03-04 | Discharge: 2021-03-05 | Disposition: A | Discharge status: Home / Self Care | Payer: MEDICARE

## 2021-03-04 ENCOUNTER — Ambulatory Visit
Admit: 2021-03-04 | Discharge: 2021-03-05 | Disposition: A | Discharge status: Home / Self Care | Payer: MEDICARE | Attending: Emergency Medicine

## 2021-03-04 ENCOUNTER — Other Ambulatory Visit: Admit: 2021-03-04 | Discharge: 2021-03-05 | Disposition: A | Discharge status: Home / Self Care | Payer: MEDICARE

## 2021-03-04 ENCOUNTER — Ambulatory Visit
Admit: 2021-03-04 | Discharge: 2021-03-05 | Disposition: A | Discharge status: Home / Self Care | Payer: MEDICARE | Attending: Adult Health | Primary: Adult Health

## 2021-03-04 DIAGNOSIS — R0902 Hypoxemia: Principal | ICD-10-CM

## 2021-03-04 DIAGNOSIS — C760 Malignant neoplasm of head, face and neck: Principal | ICD-10-CM

## 2021-03-04 DIAGNOSIS — D709 Neutropenia, unspecified: Principal | ICD-10-CM

## 2021-03-04 DIAGNOSIS — J690 Pneumonitis due to inhalation of food and vomit: Principal | ICD-10-CM

## 2021-03-04 LAB — CBC W/ AUTO DIFF
BASOPHILS ABSOLUTE COUNT: 0 10*9/L (ref 0.0–0.1)
BASOPHILS ABSOLUTE COUNT: 0 10*9/L (ref 0.0–0.1)
BASOPHILS ABSOLUTE COUNT: 0 10*9/L (ref 0.0–0.1)
BASOPHILS RELATIVE PERCENT: 1.3 %
BASOPHILS RELATIVE PERCENT: 0.6 %
BASOPHILS RELATIVE PERCENT: 0.2 %
EOSINOPHILS ABSOLUTE COUNT: 0.1 10*9/L (ref 0.0–0.5)
EOSINOPHILS ABSOLUTE COUNT: 0 10*9/L (ref 0.0–0.5)
EOSINOPHILS ABSOLUTE COUNT: 0 10*9/L (ref 0.0–0.5)
EOSINOPHILS RELATIVE PERCENT: 1.5 %
EOSINOPHILS RELATIVE PERCENT: 0.7 %
EOSINOPHILS RELATIVE PERCENT: 0.1 %
HEMATOCRIT: 24.4 % — ABNORMAL LOW (ref 34.0–44.0)
HEMATOCRIT: 25.9 % — ABNORMAL LOW (ref 34.0–44.0)
HEMATOCRIT: 25 % — ABNORMAL LOW (ref 34.0–44.0)
HEMOGLOBIN: 8.3 g/dL — ABNORMAL LOW (ref 11.3–14.9)
HEMOGLOBIN: 8.9 g/dL — ABNORMAL LOW (ref 11.3–14.9)
HEMOGLOBIN: 8.4 g/dL — ABNORMAL LOW (ref 11.3–14.9)
LYMPHOCYTES ABSOLUTE COUNT: 0.4 10*9/L — ABNORMAL LOW (ref 1.1–3.6)
LYMPHOCYTES ABSOLUTE COUNT: 0.4 10*9/L — ABNORMAL LOW (ref 1.1–3.6)
LYMPHOCYTES ABSOLUTE COUNT: 0.2 10*9/L — ABNORMAL LOW (ref 1.1–3.6)
LYMPHOCYTES RELATIVE PERCENT: 10.4 %
LYMPHOCYTES RELATIVE PERCENT: 45.3 %
LYMPHOCYTES RELATIVE PERCENT: 5.7 %
MEAN CORPUSCULAR HEMOGLOBIN CONC: 34 g/dL (ref 32.0–36.0)
MEAN CORPUSCULAR HEMOGLOBIN CONC: 34.4 g/dL (ref 32.0–36.0)
MEAN CORPUSCULAR HEMOGLOBIN CONC: 33.5 g/dL (ref 32.0–36.0)
MEAN CORPUSCULAR HEMOGLOBIN: 32.8 pg — ABNORMAL HIGH (ref 25.9–32.4)
MEAN CORPUSCULAR HEMOGLOBIN: 33 pg — ABNORMAL HIGH (ref 25.9–32.4)
MEAN CORPUSCULAR HEMOGLOBIN: 32.5 pg — ABNORMAL HIGH (ref 25.9–32.4)
MEAN CORPUSCULAR VOLUME: 96.4 fL — ABNORMAL HIGH (ref 77.6–95.7)
MEAN CORPUSCULAR VOLUME: 96.1 fL — ABNORMAL HIGH (ref 77.6–95.7)
MEAN CORPUSCULAR VOLUME: 96.8 fL — ABNORMAL HIGH (ref 77.6–95.7)
MEAN PLATELET VOLUME: 7.6 fL (ref 6.8–10.7)
MEAN PLATELET VOLUME: 7.5 fL (ref 6.8–10.7)
MEAN PLATELET VOLUME: 7.8 fL (ref 6.8–10.7)
MONOCYTES ABSOLUTE COUNT: 0.4 10*9/L (ref 0.3–0.8)
MONOCYTES ABSOLUTE COUNT: 0 10*9/L — ABNORMAL LOW (ref 0.3–0.8)
MONOCYTES ABSOLUTE COUNT: 0 10*9/L — ABNORMAL LOW (ref 0.3–0.8)
MONOCYTES RELATIVE PERCENT: 11.7 %
MONOCYTES RELATIVE PERCENT: 4.8 %
MONOCYTES RELATIVE PERCENT: 1.5 %
NEUTROPHILS ABSOLUTE COUNT: 2.7 10*9/L (ref 1.8–7.8)
NEUTROPHILS ABSOLUTE COUNT: 0.4 10*9/L — CL (ref 1.8–7.8)
NEUTROPHILS ABSOLUTE COUNT: 2.4 10*9/L (ref 1.8–7.8)
NEUTROPHILS RELATIVE PERCENT: 75.1 %
NEUTROPHILS RELATIVE PERCENT: 48.6 %
NEUTROPHILS RELATIVE PERCENT: 92.5 %
PLATELET COUNT: 447 10*9/L (ref 150–450)
PLATELET COUNT: 446 10*9/L (ref 150–450)
PLATELET COUNT: 432 10*9/L (ref 150–450)
RED BLOOD CELL COUNT: 2.53 10*12/L — ABNORMAL LOW (ref 3.95–5.13)
RED BLOOD CELL COUNT: 2.7 10*12/L — ABNORMAL LOW (ref 3.95–5.13)
RED BLOOD CELL COUNT: 2.58 10*12/L — ABNORMAL LOW (ref 3.95–5.13)
RED CELL DISTRIBUTION WIDTH: 14.4 % (ref 12.2–15.2)
RED CELL DISTRIBUTION WIDTH: 14.4 % (ref 12.2–15.2)
RED CELL DISTRIBUTION WIDTH: 14.6 % (ref 12.2–15.2)
WBC ADJUSTED: 3.6 10*9/L (ref 3.6–11.2)
WBC ADJUSTED: 0.9 10*9/L — ABNORMAL LOW (ref 3.6–11.2)
WBC ADJUSTED: 2.6 10*9/L — ABNORMAL LOW (ref 3.6–11.2)

## 2021-03-04 LAB — COMPREHENSIVE METABOLIC PANEL
ALBUMIN: 2.9 g/dL — ABNORMAL LOW (ref 3.4–5.0)
ALBUMIN: 2.3 g/dL — ABNORMAL LOW (ref 3.4–5.0)
ALKALINE PHOSPHATASE: 165 U/L — ABNORMAL HIGH (ref 46–116)
ALKALINE PHOSPHATASE: 139 U/L — ABNORMAL HIGH (ref 46–116)
ALT (SGPT): 13 U/L (ref 10–49)
ALT (SGPT): 9 U/L — ABNORMAL LOW (ref 10–49)
ANION GAP: 7 mmol/L (ref 5–14)
ANION GAP: 8 mmol/L (ref 5–14)
AST (SGOT): 15 U/L (ref <=34)
AST (SGOT): 12 U/L (ref <=34)
BILIRUBIN TOTAL: 0.2 mg/dL — ABNORMAL LOW (ref 0.3–1.2)
BILIRUBIN TOTAL: 0.2 mg/dL — ABNORMAL LOW (ref 0.3–1.2)
BLOOD UREA NITROGEN: 37 mg/dL — ABNORMAL HIGH (ref 9–23)
BLOOD UREA NITROGEN: 29 mg/dL — ABNORMAL HIGH (ref 9–23)
BUN / CREAT RATIO: 48
BUN / CREAT RATIO: 50
CALCIUM: 9.8 mg/dL (ref 8.7–10.4)
CALCIUM: 8.4 mg/dL — ABNORMAL LOW (ref 8.7–10.4)
CHLORIDE: 96 mmol/L — ABNORMAL LOW (ref 98–107)
CHLORIDE: 101 mmol/L (ref 98–107)
CO2: 28 mmol/L (ref 20.0–31.0)
CO2: 25 mmol/L (ref 20.0–31.0)
CREATININE: 0.77 mg/dL
CREATININE: 0.58 mg/dL — ABNORMAL LOW
EGFR CKD-EPI AA FEMALE: >90 mL/min/{1.73_m2} (ref >=60)
EGFR CKD-EPI AA FEMALE: >90 mL/min/{1.73_m2} (ref >=60)
EGFR CKD-EPI NON-AA FEMALE: 81 mL/min/{1.73_m2} (ref >=60)
EGFR CKD-EPI NON-AA FEMALE: >90 mL/min/{1.73_m2} (ref >=60)
GLUCOSE RANDOM: 113 mg/dL (ref 70–179)
GLUCOSE RANDOM: 127 mg/dL (ref 70–179)
POTASSIUM: 3.6 mmol/L (ref 3.4–4.8)
POTASSIUM: 3.4 mmol/L (ref 3.4–4.8)
PROTEIN TOTAL: 6.5 g/dL (ref 5.7–8.2)
PROTEIN TOTAL: 5.5 g/dL — ABNORMAL LOW (ref 5.7–8.2)
SODIUM: 131 mmol/L — ABNORMAL LOW (ref 135–145)
SODIUM: 134 mmol/L — ABNORMAL LOW (ref 135–145)

## 2021-03-04 LAB — MAGNESIUM: MAGNESIUM: 1.6 mg/dL (ref 1.6–2.6)

## 2021-03-04 LAB — BLOOD GAS CRITICAL CARE PANEL, VENOUS
BASE EXCESS VENOUS: -0.4 (ref -2.0–2.0)
CALCIUM IONIZED VENOUS (MG/DL): 4.83 mg/dL (ref 4.40–5.40)
GLUCOSE WHOLE BLOOD: 132 mg/dL (ref 70–179)
HCO3 VENOUS: 25 mmol/L (ref 22–27)
HEMOGLOBIN BLOOD GAS: 9.1 g/dL — ABNORMAL LOW
LACTATE BLOOD VENOUS: 2.4 mmol/L — ABNORMAL HIGH (ref 0.5–1.8)
O2 SATURATION VENOUS: 42.2 % (ref 40.0–85.0)
PCO2 VENOUS: 49 mmHg (ref 40–60)
PH VENOUS: 7.33 (ref 7.32–7.43)
PO2 VENOUS: 29 mmHg — ABNORMAL LOW (ref 30–55)
POTASSIUM WHOLE BLOOD: 3.5 mmol/L (ref 3.4–4.6)
SODIUM WHOLE BLOOD: 135 mmol/L (ref 135–145)

## 2021-03-04 LAB — PROTIME-INR
INR: 1.94
PROTIME: 22.6 s — ABNORMAL HIGH (ref 10.3–13.4)

## 2021-03-04 LAB — BLOOD GAS, VENOUS
BASE EXCESS VENOUS: -0.4 (ref -2.0–2.0)
FIO2 VENOUS: 50
HCO3 VENOUS: 25 mmol/L (ref 22–27)
O2 SATURATION VENOUS: 42.2 % (ref 40.0–85.0)
PCO2 VENOUS: 49 mmHg (ref 40–60)
PH VENOUS: 7.33 (ref 7.32–7.43)
PO2 VENOUS: 29 mmHg — ABNORMAL LOW (ref 30–55)

## 2021-03-04 LAB — HIGH SENSITIVITY TROPONIN I - SINGLE: HIGH SENSITIVITY TROPONIN I: 7 ng/L (ref <=34)

## 2021-03-04 MED ORDER — AMOXICILLIN 875 MG-POTASSIUM CLAVULANATE 125 MG TABLET
ORAL_TABLET | Freq: Two times a day (BID) | ORAL | 0 refills | 10 days | Status: CP
Start: 2021-03-04 — End: 2021-03-14

## 2021-03-04 MED ADMIN — sodium chloride (NS) 0.9 % infusion: 100 mL/h | INTRAVENOUS | @ 15:00:00

## 2021-03-04 MED ADMIN — famotidine (PF) (PEPCID) 20 mg/2 mL injection: @ 18:00:00 | Stop: 2021-03-04

## 2021-03-04 MED ADMIN — methylPREDNISolone sodium succinate (PF) (Solu-MEDROL) 125 mg/2 mL injection: @ 18:00:00 | Stop: 2021-03-04

## 2021-03-04 MED ADMIN — dexAMETHasone (DECADRON) tablet 20 mg: 20 mg | ORAL | @ 14:00:00 | Stop: 2021-03-04

## 2021-03-04 MED ADMIN — ondansetron (ZOFRAN) tablet 24 mg: 24 mg | ORAL | @ 14:00:00 | Stop: 2021-03-04

## 2021-03-04 MED ADMIN — CARBOplatin (PARAPLATIN) 174.2 mg in sodium chloride (NS) 0.9 % 100 mL IVPB: 174.2 mg | INTRAVENOUS | @ 18:00:00 | Stop: 2021-03-04

## 2021-03-04 MED ADMIN — oxyCODONE-acetaminophen (PERCOCET) 5-325 mg tablet 1 tablet: 1 | ORAL | @ 22:00:00 | Stop: 2021-03-04

## 2021-03-04 MED ADMIN — sodium chloride 0.9% (NS) bolus 500 mL: 500 mL | INTRAVENOUS | @ 14:00:00 | Stop: 2021-03-04

## 2021-03-04 MED ADMIN — PACLitaxel (TAXOL) 214.68 mg in sodium chloride NON-PVC (NS) 0.9 % 250 mL IVPB: 135 mg/m2 | INTRAVENOUS | @ 16:00:00 | Stop: 2021-03-04

## 2021-03-04 MED ADMIN — iohexoL (OMNIPAQUE) 350 mg iodine/mL solution 75 mL: 75 mL | INTRAVENOUS | Stop: 2021-03-04

## 2021-03-04 MED ADMIN — cetuximab (ERBITUX) IVPB 398 mg: 250 mg/m2 | INTRAVENOUS | @ 15:00:00 | Stop: 2021-03-04

## 2021-03-04 MED ADMIN — diphenhydrAMINE (BENADRYL) capsule/tablet 25 mg: 25 mg | ORAL | @ 14:00:00 | Stop: 2021-03-04

## 2021-03-04 MED ADMIN — famotidine (PF) (PEPCID) injection 20 mg: 20 mg | INTRAVENOUS | @ 14:00:00 | Stop: 2021-03-04

## 2021-03-04 NOTE — Unmapped (Unsigned)
Port accessed and labs drawn with no complications by Adam A.  Port flushed with saline.

## 2021-03-04 NOTE — Unmapped (Signed)
Bed: 75-D  Expected date:   Expected time:   Means of arrival:   Comments:  RESUS

## 2021-03-04 NOTE — Unmapped (Addendum)
[facility]  Emergency Department Provider Note       ED Clinical Impression     Final diagnoses:   Neutropenia, unspecified type (CMS-HCC) (Primary)   Hypoxia        Impression, ED Course, Assessment and Plan     Impression:  65 y.o. female  hypertension, a-fib, COPD and recurrent head and neck cancer on Eliquis who presents today for evaluation of rapid response from infusion clinic after she was getting a scheduled infusion and then acutely became hypoxic in the 70s and low 80s placed on oxygen, hypotension in the 70s, and was given fluids, Pepcid, steroids without Benadryl or epinephrine.  There is no rash or wheezing noted.  She was brought down emergently to the emergency department as a rapid response.    Patient arrived here and most of the symptoms are resolved, she appears somewhat chronically ill but not acutely unwell.  Vital signs have largely normalized except for her mild hypoxia where on room air she was 89% with a good waveform.  Appears slightly dehydrated but not frankly hypotensive.  There is no signs of anaphylaxis and clinically does not seem consistent with anaphylaxis given that she did not receive epinephrine and symptoms have improved..  Patient on nasal cannula and stable.      We will do point-of-care ultrasound to evaluate for new onset life-threatening etiology such as pericardial effusion, pneumothorax, or severe volume depletion.     Unclear etiology, possible vasovagal episode but that would not explain the hypoxia.  PE is on the differential the patient is on Eliquis and has never had a blood clot before, she is for atrial fibrillation.  Primary arrhythmia seems unlikely as patient did not lose pulses or code.  Do not feel intracranial pathology is likely given patient's not vomiting, complaining of severe headache or having new neurologic deficits.    Patient will likely need admission.    ED Course as of 03/04/21 1836   Tue Mar 04, 2021   1500 EKG with sinus rhythm, PVC, normal axis, intervals otherwise appropriate without frank ischemic change.   1544 WBC(!): 0.9   1544 Absolute Neutrophils(!!): 0.4   1555 Patient titrated down to 2 L nasal cannula and tolerating well.  Point-of-care ultrasound without pericardial effusion, right heart strain, significant heart dysfunction or pneumothorax or paging for admission, I ordered a CTA chest with contrast to evaluate for pulmonary believes him for the inpatient team although think this is very unlikely given patient is on Eliquis and no previous history and these episode was transient, believe patient would be stable for admission to oncology team given neutropenia at this time and I paged for admission.   1835 Addendum.  Spoke with inpatient team who would like a repeat CBC to see if that neutropenia was spurious value.  I think since the patient has no longer on oxygen, and her vital signs are back to normal and reassuring that may be after a CTA of the chest if there is no pulmonary believes that patient could maybe be observed and discharged from the emergency department she had a similar response last infusion and I think maybe it was a vagal episode.  I did express to them that I do not think that would explain the hypoxia however given that patient is back to baseline if there is is no pulmonary embolism and she is not truly neutropenic with a repeat CBC then I think it is reasonable to send the patient home.  This  will be communicated to next resident at signout.       ____________________________________________    The case was discussed with the attending physician who is in agreement with the above assessment and plan    Dictation software was used while making this note. Please excuse any errors made with dictation software.    Additional Medical Decision Making     I have reviewed the vital signs and the nursing notes. Labs and radiology results that were available during my care of the patient were independently reviewed by me and considered in my medical decision making.     I independently visualized the EKG tracing if performed  I independently visualized the radiology images if performed  I reviewed the patient's prior medical records if available.  Additional history obtained from family if available  I discussed the case with the admitting provider and the consulting services if the patient was admitted and/or consulting services were utilized.     History     Chief Complaint  Chief Complaint   Patient presents with   ??? Shortness of Breath       HPI     Marissa Kelly is a 65 y.o. female  hypertension, a-fib, COPD and recurrent head and neck cancer on Eliquis who presents today for evaluation of rapid response from infusion clinic after she was getting a scheduled infusion and then acutely became hypoxic in the 70s and low 80s placed on oxygen, hypotension in the 70s, and was given fluids, Pepcid, steroids without Benadryl or epinephrine.  There is no rash or wheezing noted.  She was brought down emergently to the emergency department.  On arrival to the emergency department her blood pressure largely normalized, she is not tachycardic, she is not wheezing and she has no complaints other than a very faint headache.  She says she not having any chest pain, trouble breathing, abdominal pain.  Not having any nausea, vomiting, diarrhea or rash.  No new weakness, she just feels globally weak.  She otherwise was doing fine just before the procedure it seems.    Additionally patient does not endorse any new or changing symptoms unless otherwise documented above.     ROS: 12 point ROS otherwise negative except as documented.    Past Medical History:   Diagnosis Date   ??? Hypertension        No past surgical history on file.    No current facility-administered medications for this encounter.    Current Outpatient Medications:   ???  acetaminophen (TYLENOL) 500 MG tablet, Take by mouth., Disp: , Rfl:   ???  albuterol HFA 90 mcg/actuation inhaler, Inhale 2 puffs., Disp: , Rfl:   ???  ALBUTEROL INHL, Inhale., Disp: , Rfl:   ???  amiodarone (PACERONE) 200 MG tablet, Take 1 tablet (200 mg total) by mouth daily., Disp: 30 tablet, Rfl: 0  ???  amLODIPine (NORVASC) 5 MG tablet, Take 5 mg by mouth daily., Disp: , Rfl:   ???  apixaban (ELIQUIS) 5 mg Tab, Take 5 mg by mouth Two (2) times a day., Disp: , Rfl:   ???  docusate sodium (COLACE) 100 MG capsule, Take 1 capsule by mouth daily., Disp: , Rfl:   ???  fentaNYL 37.5 mcg/hour PT72, , Disp: , Rfl:   ???  filgrastim (NEUPOGEN) 300 mcg/0.5 mL Syrg, Inject 0.5 mL (300 mcg total) under the skin daily. For 2 days each week of chemotherapy. Do not administer within 24 hours of chemotherapy., Disp: 4  mL, Rfl: 0  ???  folic acid/multivit-min/lutein (CENTRUM SILVER ORAL), Take 1 tablet by mouth., Disp: , Rfl:   ???  food supplemt, lactose-reduced (ENSURE ACTIVE HIGH PROTEIN) Liqd, Take 237 mL by mouth., Disp: , Rfl:   ???  furosemide (LASIX) 20 MG tablet, Take 20 mg by mouth., Disp: , Rfl:   ???  gabapentin (NEURONTIN) 300 MG capsule, Take 2 capsules (600 mg total) by mouth Three (3) times a day., Disp: 270 capsule, Rfl: 0  ???  ibuprofen (ADVIL,MOTRIN) 800 MG tablet, Take 800 mg by mouth every six (6) hours as needed for pain., Disp: , Rfl:   ???  lidocaine-prilocaine (EMLA) 2.5-2.5 % cream, , Disp: , Rfl:   ???  lisinopriL (PRINIVIL,ZESTRIL) 20 MG tablet, Take 1 tablet (20 mg total) by mouth daily., Disp: 30 tablet, Rfl: 0  ???  magnesium chloride (MAG 64) 64 mg magnesium TbEC, Take 64 mg by mouth daily. 2 times daily, Disp: , Rfl:   ???  melatonin 10 mg Tab, Take 10 mg by mouth., Disp: , Rfl:   ???  mirtazapine (REMERON) 15 MG tablet, Take 15 mg by mouth nightly., Disp: , Rfl:   ???  ondansetron (ZOFRAN) 8 MG tablet, Take 1 tablet (8 mg total) by mouth every eight (8) hours as needed for nausea (or vomiting)., Disp: 30 tablet, Rfl: 2  ???  oxyCODONE-acetaminophen (PERCOCET) 5-325 mg per tablet, Take 2 tablets by mouth every six (6) hours as needed for pain., Disp: , Rfl:   ???  potassium chloride (KLOR-CON) 10 MEQ CR tablet, Take by mouth daily. Take 30 m l (40 MEQ) po daily, Disp: , Rfl:   ???  prochlorperazine (COMPAZINE) 10 MG tablet, Take 1 tablet (10 mg total) by mouth every six (6) hours as needed (Nausea/Vomiting)., Disp: 30 tablet, Rfl: 2  ???  VENTOLIN HFA 90 mcg/actuation inhaler, , Disp: , Rfl:     Facility-Administered Medications Ordered in Other Encounters:   ???  diphenhydrAMINE (BENADRYL) 50 mg/mL injection, , , ,   ???  OKAY TO SEND MEDICATION/CHEMOTHERAPY TO UNIT, , Other, Once, Siddharth Hemant Sheth, DO  ???  sodium chloride (NS) 0.9 % infusion, 100 mL/hr, Intravenous, Continuous, Siddharth Hemant Sheth, DO, Stopped at 03/04/21 1400    Allergies  Naproxen, Aspirin, Belladonna, Phenobarbital, Fluoxetine, and Paroxetine hcl    Family History   Problem Relation Age of Onset   ??? Skin cancer Neg Hx        Social History  Social History     Tobacco Use   ??? Smoking status: Never Smoker   ??? Smokeless tobacco: Never Used   Substance Use Topics   ??? Alcohol use: Not on file   ??? Drug use: Not on file        Physical Exam     VITAL SIGNS:      Vitals:    03/04/21 1429 03/04/21 1828   BP: 102/59 128/73   Pulse: 86 72   Resp: 12 14   Temp:  36.7 ??C (98.1 ??F)   TempSrc:  Oral   SpO2: 94% 96%       Constitutional: Alert and oriented.  Chronically unwell, somewhat acutely unwell appearing and in no distress.  Eyes: Conjunctivae are normal.  HEENT: Normocephalic and atraumatic.Conjunctivae clear. No congestion.  Somewhat dry and tacky mucous membranes.  Mucositis noted to lips  Cardiovascular: Normal rate, regular rhythm. Normal and symmetric distal pulses.  Decreased capillary refill.  Decreased skin turgor.  Left anterior chest port  Respiratory: Normal respiratory effort. Breath sounds are normal. There are no wheezing or crackles heard.  Gastrointestinal: Soft, non-tender, non-distended.  Musculoskeletal: No obvious deformities       Right lower leg: No tenderness or edema. Left lower leg: No tenderness or edema.  Neurologic: Normal speech and language. 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 Patient is moving all extremities equally, face is symmetric at rest and with speech.  No pronator drift.  Equal arm and leg strength with some global weakness.   Skin: Skin is warm, dry and intact.  No diffuse urticarial rash  Psychiatric: Mood and affect are normal. Speech and behavior are normal.     Radiology     XR Chest 1 view Portable   Final Result      No evidence of acute cardiopulmonary pathology.      ED POCUS Chest Bilateral   Final Result      CTA Chest W Contrast    (Results Pending)        Laboratory Data     Lab Results   Component Value Date    WBC 0.9 (L) 03/04/2021    HGB 8.9 (L) 03/04/2021    HCT 25.9 (L) 03/04/2021    PLT 446 03/04/2021       Lab Results   Component Value Date    NA 134 (L) 03/04/2021    NA 135 03/04/2021    K 3.4 03/04/2021    K 3.5 03/04/2021    CL 101 03/04/2021    CO2 25.0 03/04/2021    BUN 29 (H) 03/04/2021    CREATININE 0.58 (L) 03/04/2021    GLU 127 03/04/2021    CALCIUM 8.4 (L) 03/04/2021    MG 1.6 03/04/2021       Lab Results   Component Value Date    BILITOT 0.2 (L) 03/04/2021    PROT 5.5 (L) 03/04/2021    ALBUMIN 2.3 (L) 03/04/2021    ALT 9 (L) 03/04/2021    AST 12 03/04/2021    ALKPHOS 139 (H) 03/04/2021       Lab Results   Component Value Date    INR 1.94 03/04/2021       Pertinent labs & imaging results that were available during my care of the patient were reviewed by me and considered in my medical decision making (see chart for details).    Portions of this record have been created using Scientist, clinical (histocompatibility and immunogenetics). Dictation errors have been sought, but may not have been identified and corrected.    Eldridge Abrahams, MD  EM     Eldridge Abrahams, MD  Resident  03/04/21 1558       Eldridge Abrahams, MD  Resident  03/04/21 [phone]

## 2021-03-04 NOTE — Unmapped (Signed)
Infusion Progress Note    Patient Name: Marissa Kelly  Patient Age: 65 y.o.  Encounter Date: 03/04/2021  Allergies   Allergen Reactions   ??? Naproxen Other (See Comments), Rash and Swelling   ??? Aspirin Other (See Comments)   ??? Belladonna    ??? Phenobarbital Other (See Comments)   ??? Fluoxetine Rash   ??? Paroxetine Hcl Rash       Reason for visit  Infusion Center visit   Today is cycle 1/ day 22 of Head/Neck Cetuximab/Paclitaxel/Carboplatin  therapy.   Chief Complaint: hypersensitivity reaction    I spent at least 25 minutes with this patient: assessing, performing physical exam with > 50% of time spent in counseling.     Assessment/Plan:  Hypersensitivity reaction Grade 3   -premedications: decadron 20mg  PO, zofran 24mg  PO, benadryl 25mg  PO, pepcid 20mg  IV  -symptoms: altered mental status, hypotension SBP 80s, desaturation to 70s, wheezing   -amount infused: 77mL Carboplatin  -reaction medications: pepcid 20mg  IV, methylprednisolone 125mg  IV  -disposition: rapid response called for AMS, concerning desaturation and hypotension. Taken to ED     I have discussed the case (exam, laboratory results, and plan) with NP Doneen Poisson, Rapid Response team and nursing staff.      Interval History/HPI:   Marissa Kelly is a 66 y.o. female patient with ??hypertension, a-fib, COPD and recurrent head and neck cancer on Eliquis who comes to infusion clinic for D22C1 Cetux/Taxol/Carbo. She tolerated Cetuximab and Paclitaxel infusion without sequelae however during her Carbo infusion, she became acutely altered, momentarily unresponsive, acutely desaturated to 70s, then 80s on RA, hypotensive 81/51 with respiratory wheezing appreciated. The infusion was stopped. NS IVF were given along with pepcid & solumedrol. Patient's vital signs slowly responded but not to baseline. Pulse was thready and skin was cold and clammy. She followed some commands, slowly. A rapid response was called for the acute change in mental status and concerning vital signs. Patient was taken to ER for further evaluation and work up.     Review of Systems:  A complete review of systems was obtained including: Constitutional, Eyes, ENT, Cardiovascular, Respiratory, GI, GU, Musculoskeletal, Skin, Neurological, Psychiatric, Endocrine, Heme/Lymphatic, and Allergic/Immunologic systems. It is negative or non-contributory to the patient???s management except for positives mentioned in HPI.     Physical Exam:  Vital Signs:  Vitals:    03/04/21 1413 03/04/21 1414 03/04/21 1414 03/04/21 1417   BP:    95/60   Pulse:    92   Resp:       Temp:       TempSrc:       SpO2: (!) 89% 90% 93% 92%       General:               chronically ill-appearing, cachectic  HEENT:               conjunctiva clear, hoarse voice  Cardiovascular:   cool, clammy, RRR  Respiratory:         wheeze audible in anterior LLL and RLL  Extremities:          peripheral pulses abnormal thready and weak  Skin:                    no rashes, lesions or breakdown, cool and clammy  Psychiatric:          calm and blunted  Neuro:  oriented to place    Results:  Lab on 03/04/2021   Component Date Value Ref Range Status   ??? Sodium 03/04/2021 131 (A) 135 - 145 mmol/L Final   ??? Potassium 03/04/2021 3.6  3.4 - 4.8 mmol/L Final   ??? Chloride 03/04/2021 96 (A) 98 - 107 mmol/L Final   ??? Anion Gap 03/04/2021 7  5 - 14 mmol/L Final   ??? CO2 03/04/2021 28.0  20.0 - 31.0 mmol/L Final   ??? BUN 03/04/2021 37 (A) 9 - 23 mg/dL Final   ??? Creatinine 03/04/2021 0.77  0.60 - 0.80 mg/dL Final   ??? BUN/Creatinine Ratio 03/04/2021 48   Final   ??? EGFR CKD-EPI Non-African American,* 03/04/2021 81  >=60 mL/min/1.68m2 Final   ??? EGFR CKD-EPI African American, Fem* 03/04/2021 >90  >=60 mL/min/1.76m2 Final   ??? Glucose 03/04/2021 113  70 - 179 mg/dL Final   ??? Calcium 24/40/1027 9.8  8.7 - 10.4 mg/dL Final   ??? Albumin 25/36/6440 2.9 (A) 3.4 - 5.0 g/dL Final   ??? Total Protein 03/04/2021 6.5  5.7 - 8.2 g/dL Final   ??? Total Bilirubin 03/04/2021 0.2 (A) 0.3 - 1.2 mg/dL Final   ??? AST 34/74/2595 15  <=34 U/L Final   ??? ALT 03/04/2021 13  10 - 49 U/L Final   ??? Alkaline Phosphatase 03/04/2021 165 (A) 46 - 116 U/L Final   ??? Magnesium 03/04/2021 1.6  1.6 - 2.6 mg/dL Final   ??? WBC 63/87/5643 3.6  3.6 - 11.2 10*9/L Final   ??? RBC 03/04/2021 2.53 (A) 3.95 - 5.13 10*12/L Final   ??? HGB 03/04/2021 8.3 (A) 11.3 - 14.9 g/dL Final   ??? HCT 32/95/1884 24.4 (A) 34.0 - 44.0 % Final   ??? MCV 03/04/2021 96.4 (A) 77.6 - 95.7 fL Final   ??? MCH 03/04/2021 32.8 (A) 25.9 - 32.4 pg Final   ??? MCHC 03/04/2021 34.0  32.0 - 36.0 g/dL Final   ??? RDW 16/60/6301 14.4  12.2 - 15.2 % Final   ??? MPV 03/04/2021 7.6  6.8 - 10.7 fL Final   ??? Platelet 03/04/2021 447  150 - 450 10*9/L Final   ??? Neutrophils % 03/04/2021 75.1  % Final   ??? Lymphocytes % 03/04/2021 10.4  % Final   ??? Monocytes % 03/04/2021 11.7  % Final   ??? Eosinophils % 03/04/2021 1.5  % Final   ??? Basophils % 03/04/2021 1.3  % Final   ??? Absolute Neutrophils 03/04/2021 2.7  1.8 - 7.8 10*9/L Final   ??? Absolute Lymphocytes 03/04/2021 0.4 (A) 1.1 - 3.6 10*9/L Final   ??? Absolute Monocytes 03/04/2021 0.4  0.3 - 0.8 10*9/L Final   ??? Absolute Eosinophils 03/04/2021 0.1  0.0 - 0.5 10*9/L Final   ??? Absolute Basophils 03/04/2021 0.0  0.0 - 0.1 10*9/L Final       Erlinda Hong, PA

## 2021-03-04 NOTE — Unmapped (Signed)
Bed: 73-D  Expected date:   Expected time:   Means of arrival:   Comments:  86

## 2021-03-04 NOTE — Unmapped (Signed)
Pt received cetuximab and paclitaxel without difficulty. Carboplatin stopped at 1400 for altered mental status. Pt received 77ml of carboplatin. APP notified. Rapid response called. Pt transported to ED by RRT via stretcher.

## 2021-03-04 NOTE — Unmapped (Signed)
Head and Neck Oncology Clinic  Referring Physician: Ann Kelly [facility], [facility]  [address]  3rd Floor [facility] [id number]  [place],  [place] [address]. [phone]    PCP: Marissa Pao, MD    Consulting providers:  Otolaryngology: Dr. Roma Kelly   Radiation Oncology: N/A    Reason for Visit: office visit, follow up on treatment     Assessment/Plan:    Marissa Kelly is a 65 y.o. female with history of hypertension, a-fib, COPD and recurrent head and neck cancer. 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 Diagnosed with biopsy-proven recurrence and started on neoadjuvant chemotherapy with Keis regimen. Goal is for symptom control and to improve surgical outcomes. C1D1 02/11/2021.     # Recurrent SCC right maxillary sinus   - Primary location is right pterygopalatine fossa with V2/V3 involvement with extension into skull base. Based on MRI has unresectable, locally advanced SNSCC   - Treating with neoadjvuant therapy with Keis regimen followed by re-evaluation for surgical resection.??Treatment goals include symptom control and chance to improve surgical outcomes. However, patient understands that this is an aggressive cancer and if she does not respond, we will continue same treatment regimen but with palliative intent   - Labs reviewed and acceptable for C1D22.  ANC 2.7, Hgb 8.3.    - will proceed with full dose today and reassess HGB next week.  Discussed poss need for transfusion to maintain dose.  She is agreeable to this plan      # Lower extremity peripheral neuropathy   - Secondary to Cisplatin, has not worsened.  Denies falls, difficulty ambulating.    - Will proceed with treatment today; however, if symptoms progress we will consider DR at next visit     # Supportive care  - Cancer-related pain: managed on current regimen; fentanyl 100 mcg buccal tablet, percocet 5-325 mg q6h prn. gabapentin to 600 mg TID.  - Smoking Cessation: currently smoking about half pack daily. She has deferred assistance at this time.   - Constipation: none   - Access: Port   - Psychosocial:  denies any need for CCSP at this time.     Follow up: 1 week    Marissa Kelly. Marissa Kelly  Division of Oncology  [facility]    I personally spent 40 min face to face with the patient, including all pre, intra, and post visit time on the date of service.   --------------------------  Interval History  - LLE nauropathy continues, no worse  - denies falls  - weight down minimally, stable compared to last 2 weeks  - denies n/v, constipation, diarrhea, rash, urticaria  -continues smoking ~ 0.5 ppd  - decreased energy 2-3 days after treatment, not fully recovering this week    History of Present Illness:  Marissa Kelly is a 65 y.o. female with history of hypertension a-fib, COPD, who presents for evaluation of head and neck cancer. I have reviewed their records including history, imaging, pathology reports, and, when applicable, operative notes and summarized her oncologic history below:    Patient presented to the ED in September 2021 for evaluation of 6 week right-sided facial swelling and pain. Pain first started as a small oral ulcer that progressively worsened, causing difficulty with mastication and oral intake. CT maxillofacial obtained in the ED was notable for bulky and indistinct enhancing tumor which  has agressively destroyed the right maxilla, infiltrates along the right buccal space, floor of right nasal cavity, into the right pterygoid palatine fossa, right orbital apex, right soft palate and palatine tonsil. Furthermore, there was evidence of intracranial extension along the right V2 at the foramen rotundum. There was also evidence of right level 1A lymph node measuring 7 mm.     Patient was referred to oncology for further evaluation. Biopsy revealed invasive SCC, well differentiating. She completed concurrent chemoradiation therapy in December 2021. Patient tolerated chemo without serve side effects or toxicity but reports some difficulty with the radiation treatment, endorsing recurrent hospitalizations for a-fib and pneumonia. Felt her symptoms improved following treatment, was eating more solid foods.    Unfortunately, she developed recurrent facial swelling about 1-2 months after completing treatment. There was concern for sinus infection, underwent ear tube placement without any resolution in pain or swelling. She endorses throat pain, mostly eating liquids and soft foods.     Repeat scans from 01/27/21 were notable for recurrent disease in the right pterygopalatine fossa, along V2/V3 extending into the cavernous sinus and orbital fissure, as possible perineural spread.     She describes mild right-sided otalgia and facial numbness/weakness. Experienced similar symptoms prior to initial diagnosis that never entirely resolved. She reports ongoing dysphagia but attributes this to radiation therapy. Denies nausea or vomiting.     Patient is a current smoker, reports 50 year pack history. Denies heavy alcohol use.     Additional history is given by roommate on the phone .    Past Medical History:   Diagnosis Date   ??? Hypertension        No past surgical history on file.    Current Outpatient Medications   Medication Sig Dispense Refill   ??? acetaminophen (TYLENOL) 500 MG tablet Take by mouth.     ??? albuterol HFA 90 mcg/actuation inhaler Inhale 2 puffs.     ??? ALBUTEROL INHL Inhale.     ??? amiodarone (PACERONE) 200 MG tablet Take 1 tablet (200 mg total) by mouth daily. 30 tablet 0   ??? amLODIPine (NORVASC) 5 MG tablet Take 5 mg by mouth daily.     ??? apixaban (ELIQUIS) 5 mg Tab Take 5 mg by mouth Two (2) times a day.     ??? docusate sodium (COLACE) 100 MG capsule Take 1 capsule by mouth daily.     ??? fentaNYL 37.5 mcg/hour PT72      ??? filgrastim (NEUPOGEN) 300 mcg/0.5 mL Syrg Inject 0.5 mL (300 mcg total) under the skin daily. For 2 days each week of chemotherapy. Do not administer within 24 hours of chemotherapy. 4 mL 0   ??? folic acid/multivit-min/lutein (CENTRUM SILVER ORAL) Take 1 tablet by mouth.     ??? food supplemt, lactose-reduced (ENSURE ACTIVE HIGH PROTEIN) Liqd Take 237 mL by mouth.     ??? furosemide (LASIX) 20 MG tablet Take 20 mg by mouth.     ??? gabapentin (NEURONTIN) 300 MG capsule Take 2 capsules (600 mg total) by mouth Three (3) times a day. 270 capsule 0   ??? ibuprofen (ADVIL,MOTRIN) 800 MG tablet Take 800 mg by mouth every six (6) hours as needed for pain.     ??? lidocaine-prilocaine (EMLA) 2.5-2.5 % cream      ??? lisinopriL (PRINIVIL,ZESTRIL) 20 MG tablet Take 1 tablet (20 mg total) by mouth daily. 30 tablet 0   ??? magnesium chloride (MAG 64) 64 mg magnesium TbEC Take 64 mg by mouth daily.  2 times daily     ??? melatonin 10 mg Tab Take 10 mg by mouth.     ??? mirtazapine (REMERON) 15 MG tablet Take 15 mg by mouth nightly.     ??? ondansetron (ZOFRAN) 8 MG tablet Take 1 tablet (8 mg total) by mouth every eight (8) hours as needed for nausea (or vomiting). 30 tablet 2   ??? oxyCODONE-acetaminophen (PERCOCET) 5-325 mg per tablet Take 2 tablets by mouth every six (6) hours as needed for pain.     ??? potassium chloride (KLOR-CON) 10 MEQ CR tablet Take by mouth daily. Take 30 m l (40 MEQ) po daily     ??? prochlorperazine (COMPAZINE) 10 MG tablet Take 1 tablet (10 mg total) by mouth every six (6) hours as needed (Nausea/Vomiting). 30 tablet 2   ??? VENTOLIN HFA 90 mcg/actuation inhaler        No current facility-administered medications for this visit.     Facility-Administered Medications Ordered in Other Visits   Medication Dose Route Frequency Provider Last Rate Last Admin   ??? CARBOplatin (PARAPLATIN) 174.2 mg in sodium chloride (NS) 0.9 % 100 mL IVPB  174.2 mg Intravenous Once Siddharth Hemant Sheth, DO       ??? cetuximab (ERBITUX) IVPB 398 mg  250 mg/m2 (Treatment Plan Recorded) Intravenous Once Siddharth Hemant Sheth, DO       ??? dexAMETHasone (DECADRON) tablet 20 mg  20 mg Oral Once [employer], DO       ??? diphenhydrAMINE (BENADRYL) capsule/tablet 25 mg  25 mg Oral Once Siddharth Hemant Sheth, DO       ??? famotidine (PF) (PEPCID) injection 20 mg  20 mg Intravenous Once Siddharth Hemant Sheth, DO       ??? OKAY TO SEND MEDICATION/CHEMOTHERAPY TO UNIT   Other Once Siddharth Hemant Sheth, DO       ??? ondansetron (ZOFRAN) tablet 24 mg  24 mg Oral Once [employer], DO       ??? PACLitaxel (TAXOL) 214.68 mg in sodium chloride NON-PVC (NS) 0.9 % 250 mL IVPB  135 mg/m2 (Treatment Plan Recorded) Intravenous Once Siddharth Hemant Sheth, DO       ??? sodium chloride (NS) 0.9 % infusion  100 mL/hr Intravenous Continuous Siddharth Hemant Sheth, DO       ??? sodium chloride 0.9% (NS) bolus 500 mL  500 mL Intravenous Once Siddharth Hemant Sheth, DO           Allergies   Allergen Reactions   ??? Naproxen Other (See Comments), Rash and Swelling   ??? Aspirin Other (See Comments)   ??? Belladonna    ??? Phenobarbital Other (See Comments)   ??? Fluoxetine Rash   ??? Paroxetine Hcl Rash       Social History     Tobacco Use   ??? Smoking status: Never Smoker   ??? Smokeless tobacco: Never Used   Substance Use Topics   ??? Alcohol use: Not on file   ??? Drug use: Not on file       Social History     Social History Narrative   ??? Not on file       Family History   Problem Relation Age of Onset   ??? Skin cancer Neg Hx        Review of Systems: A 12-system review of systems was obtained including: Constitutional, Eyes, ENT, Cardiovascular, Respiratory, GI, GU, Musculoskeletal, Skin, Neurological, Psychiatric, Endocrine, Heme/Lymphatic, and Allergic/Immunologic systems. It is negative or non-contributory  to the patient???s management except for as stated in patient's HPI    ECOG PS: 1    Physical Examination:  Vital Signs: BP 99/54  - Pulse 72  - Temp 36.1 ??C (96.9 ??F) (Temporal)  - Resp 14  - Ht 170.2 cm (5' 7.01)  - Wt 47.9 kg (105 lb 9.6 oz)  - SpO2 100%  - BMI 16.54 kg/m??   CONSTITUTIONAL: Pleasant, tired,  woman; NAD  Head and Face:  Mild orbital edema   Hearing:  Normal hearing to spoken voice.  Oral Cavity: moist mucosa, tongue is mobile; tumor noted to right posterior hard palate, open area ~ 1.5 cm  Lymphatics: No palpable lymphadenopathy   CV: RRR; no lower extremity edema, no murmur  RESP: normal work of breathing, clear bilaterally, no wheezing   GI: Soft, non-tender, non-distended  SKIN: No rashes or ecchymoses, warm and dry, fair turgor  NEURO: Alert and oriented,  numbness of right face consistent with V2/V3 involvement, speech garbaled, gait steady with walker  PSYCH: Normal mood and appropriate affect    LABS  Lab Results   Component Value Date    WBC 3.6 03/04/2021    HGB 8.3 (L) 03/04/2021    HCT 24.4 (L) 03/04/2021    PLT 447 03/04/2021       Lab Results   Component Value Date    NA 131 (L) 03/04/2021    K 3.6 03/04/2021    CL 96 (L) 03/04/2021    CO2 28.0 03/04/2021    BUN 37 (H) 03/04/2021    CREATININE 0.77 03/04/2021    GLU 113 03/04/2021    CALCIUM 9.8 03/04/2021    MG 1.6 03/04/2021       Lab Results   Component Value Date    BILITOT 0.2 (L) 03/04/2021    PROT 6.5 03/04/2021    ALBUMIN 2.9 (L) 03/04/2021    ALT 13 03/04/2021    AST 15 03/04/2021    ALKPHOS 165 (H) 03/04/2021       No results found for: PT, INR, APTT    IMAGING    MRI Arlys John (01/27/2021)   - Findings as above suggestive of tumor in the right pterygopalatine fossa, along V2/V3 extending into the cavernous sinus and orbital fissure suspicious for perineural spread /recurrent disease.      PATHOLOGY  Right palate biopsy (07/25/20)   Diagnosis   Invasive squamous cell carcinoma (well differentiated, keratinizing)

## 2021-03-04 NOTE — Unmapped (Signed)
Brought down from infusion clinic for SOB workup. Pt desatted in route, now on ventimask 50%

## 2021-03-05 MED ADMIN — amoxicillin-clavulanate (AUGMENTIN) 875-125 mg per tablet 1 tablet: 1 | ORAL | @ 03:00:00 | Stop: 2021-03-04

## 2021-03-05 NOTE — Unmapped (Signed)
ED Progress Note    Received patient in signout from Dr. Dalbert Batman at 7:00 PM.    Briefly, this is a 65 year old female with history of hypertension, atrial fibrillation on Eliquis, COPD, recurrent head and neck cancer who presented after a rapid response in the infusion clinic during scheduled infusion in which she became acutely hypoxic to the 70s, was placed on oxygen.  She had some concomitant hypotension to the 70s systolic, was given crystalloid bolus.  She was also given Pepcid and corticosteroids, no Benadryl or epinephrine given.  No rash or wheezing.  Most of her symptoms resolved at time of initial presentation in the ED.  She continued to have some mild hypoxia to 89% on room air with a good waveform.  She did appear slightly dehydrated.  Her hypotension had improved.  There is no evidence of anaphylaxis.  She has had vasovagal episodes with infusions in the past.  PE considered, CTA pending.  Primary arrhythmia thought to be less likely.  Point-of-care ultrasound showed no evidence of pericardial effusion or right heart strain.  She was found to be newly neutropenic with ANC 0.4.  Patient has been discussed with an inpatient medicine team.  She is no longer on oxygen, and her vital signs have normalized.    Plan is for CTA chest to rule out PE.  We will also repeat CBC to see if neutropenia with spurious.  Will determine disposition once CTA completed and CBC repeated.    ED Course as of 03/04/21 2307   Tue Mar 04, 2021   2054 CTA chest with no evidence of PE.  Patient does have scattered groundglass opacities, most prominent in the medial bilateral lower lobes concerning for aspiration/infection.  Repeat CBC with no neutropenia, appears consistent with baseline.   2155 Patient reevaluated by Dr. Warren Danes, the ED attending physician.  Shared decision-making discussion was had, and we did recommend admission given her bilateral groundglass opacities, possibly infectious.  Patient declines this and would like to be discharged.  She has been on 2 L nasal cannula, will ensure no hypoxia on room air.  I do not feel that discharge would be unreasonable, particularly as she is not neutropenic, and she will follow-up closely with oncology.  I would discharge her on Augmentin for possible aspiration pneumonia coverage.  Strict return precautions will be provided, and she will follow-up with oncology.         Marissa Kelly. Ernst Bowler, MD, M. Renae Fickle  Chief Resident, [facility] Emergency Medicine  Pager: [phone]

## 2021-03-05 NOTE — Unmapped (Signed)
Admission on 03/04/2021   Component Date Value Ref Range Status   ??? Sodium 03/04/2021 134 (A) 135 - 145 mmol/L Final   ??? Potassium 03/04/2021 3.4  3.4 - 4.8 mmol/L Final   ??? Chloride 03/04/2021 101  98 - 107 mmol/L Final   ??? Anion Gap 03/04/2021 8  5 - 14 mmol/L Final   ??? CO2 03/04/2021 25.0  20.0 - 31.0 mmol/L Final   ??? BUN 03/04/2021 29 (A) 9 - 23 mg/dL Final   ??? Creatinine 03/04/2021 0.58 (A) 0.60 - 0.80 mg/dL Final   ??? BUN/Creatinine Ratio 03/04/2021 50   Final   ??? EGFR CKD-EPI Non-African American,* 03/04/2021 >90  >=60 mL/min/1.88m2 Final   ??? EGFR CKD-EPI African American, Fem* 03/04/2021 >90  >=60 mL/min/1.53m2 Final   ??? Glucose 03/04/2021 127  70 - 179 mg/dL Final   ??? Calcium 09/81/1914 8.4 (A) 8.7 - 10.4 mg/dL Final   ??? Albumin 78/29/5621 2.3 (A) 3.4 - 5.0 g/dL Final   ??? Total Protein 03/04/2021 5.5 (A) 5.7 - 8.2 g/dL Final   ??? Total Bilirubin 03/04/2021 0.2 (A) 0.3 - 1.2 mg/dL Final   ??? AST 30/86/5784 12  <=34 U/L Final   ??? ALT 03/04/2021 9 (A) 10 - 49 U/L Final   ??? Alkaline Phosphatase 03/04/2021 139 (A) 46 - 116 U/L Final   ??? Specimen Source 03/04/2021 Venous   Final   ??? FIO2 Venous 03/04/2021 50%   Final   ??? pH, Venous 03/04/2021 7.33  7.32 - 7.43 Final   ??? pCO2, Ven 03/04/2021 49  40 - 60 mm Hg Final   ??? pO2, Ven 03/04/2021 29 (A) 30 - 55 mm Hg Final   ??? HCO3, Ven 03/04/2021 25  22 - 27 mmol/L Final   ??? Base Excess, Ven 03/04/2021 -0.4  -2.0 - 2.0 Final   ??? O2 Saturation, Venous 03/04/2021 42.2  40.0 - 85.0 % Final   ??? hsTroponin I 03/04/2021 7  <=34 ng/L Final   ??? PT 03/04/2021 22.6 (A) 10.3 - 13.4 sec Final   ??? INR 03/04/2021 1.94   Final   ??? WBC 03/04/2021 0.9 (A) 3.6 - 11.2 10*9/L Final   ??? RBC 03/04/2021 2.70 (A) 3.95 - 5.13 10*12/L Final   ??? HGB 03/04/2021 8.9 (A) 11.3 - 14.9 g/dL Final   ??? HCT 69/62/9528 25.9 (A) 34.0 - 44.0 % Final   ??? MCV 03/04/2021 96.1 (A) 77.6 - 95.7 fL Final   ??? MCH 03/04/2021 33.0 (A) 25.9 - 32.4 pg Final   ??? MCHC 03/04/2021 34.4  32.0 - 36.0 g/dL Final   ??? RDW 41/32/4401 14.4  12.2 - 15.2 % Final   ??? MPV 03/04/2021 7.5  6.8 - 10.7 fL Final   ??? Platelet 03/04/2021 446  150 - 450 10*9/L Final   ??? Neutrophils % 03/04/2021 48.6  % Final   ??? Lymphocytes % 03/04/2021 45.3  % Final   ??? Monocytes % 03/04/2021 4.8  % Final   ??? Eosinophils % 03/04/2021 0.7  % Final   ??? Basophils % 03/04/2021 0.6  % Final   ??? Absolute Neutrophils 03/04/2021 0.4 (A) 1.8 - 7.8 10*9/L Final   ??? Absolute Lymphocytes 03/04/2021 0.4 (A) 1.1 - 3.6 10*9/L Final   ??? Absolute Monocytes 03/04/2021 0.0 (A) 0.3 - 0.8 10*9/L Final   ??? Absolute Eosinophils 03/04/2021 0.0  0.0 - 0.5 10*9/L Final   ??? Absolute Basophils 03/04/2021 0.0  0.0 - 0.1 10*9/L Final   ???  EKG Ventricular Rate 03/04/2021 82  BPM Incomplete   ??? EKG Atrial Rate 03/04/2021 82  BPM Incomplete   ??? EKG P-R Interval 03/04/2021 184  ms Incomplete   ??? EKG QRS Duration 03/04/2021 92  ms Incomplete   ??? EKG Q-T Interval 03/04/2021 394  ms Incomplete   ??? EKG QTC Calculation 03/04/2021 460  ms Incomplete   ??? EKG Calculated P Axis 03/04/2021 85  degrees Incomplete   ??? EKG Calculated R Axis 03/04/2021 52  degrees Incomplete   ??? EKG Calculated T Axis 03/04/2021 24  degrees Incomplete   ??? QTC Fredericia 03/04/2021 437  ms Incomplete   ??? Specimen Source 03/04/2021    Final    venous   ??? FIO2 Venous 03/04/2021    Final    unspecified   ??? pH, Venous 03/04/2021 7.33  7.32 - 7.43 Final   ??? pCO2, Ven 03/04/2021 49  40 - 60 mm Hg Final   ??? pO2, Ven 03/04/2021 29 (A) 30 - 55 mm Hg Final   ??? HCO3, Ven 03/04/2021 25  22 - 27 mmol/L Final   ??? Base Excess, Ven 03/04/2021 -0.4  -2.0 - 2.0 Final   ??? O2 Saturation, Venous 03/04/2021 42.2  40.0 - 85.0 % Final   ??? Sodium Whole Blood 03/04/2021 135  135 - 145 mmol/L Final   ??? Potassium, Bld 03/04/2021 3.5  3.4 - 4.6 mmol/L Final   ??? Calcium, Ionized Venous 03/04/2021 4.83  4.40 - 5.40 mg/dL Final   ??? Glucose Whole Blood 03/04/2021 132  70 - 179 mg/dL Final   ??? Lactate, Venous 03/04/2021 2.4 (A) 0.5 - 1.8 mmol/L Final   ??? Hgb, blood gas 03/04/2021 9.10 (A) 12.00 - 16.00 g/dL Final   Lab on 16/08/9603   Component Date Value Ref Range Status   ??? Sodium 03/04/2021 131 (A) 135 - 145 mmol/L Final   ??? Potassium 03/04/2021 3.6  3.4 - 4.8 mmol/L Final   ??? Chloride 03/04/2021 96 (A) 98 - 107 mmol/L Final   ??? Anion Gap 03/04/2021 7  5 - 14 mmol/L Final   ??? CO2 03/04/2021 28.0  20.0 - 31.0 mmol/L Final   ??? BUN 03/04/2021 37 (A) 9 - 23 mg/dL Final   ??? Creatinine 03/04/2021 0.77  0.60 - 0.80 mg/dL Final   ??? BUN/Creatinine Ratio 03/04/2021 48   Final   ??? EGFR CKD-EPI Non-African American,* 03/04/2021 81  >=60 mL/min/1.68m2 Final   ??? EGFR CKD-EPI African American, Fem* 03/04/2021 >90  >=60 mL/min/1.44m2 Final   ??? Glucose 03/04/2021 113  70 - 179 mg/dL Final   ??? Calcium 54/07/8118 9.8  8.7 - 10.4 mg/dL Final   ??? Albumin 14/78/2956 2.9 (A) 3.4 - 5.0 g/dL Final   ??? Total Protein 03/04/2021 6.5  5.7 - 8.2 g/dL Final   ??? Total Bilirubin 03/04/2021 0.2 (A) 0.3 - 1.2 mg/dL Final   ??? AST 21/30/8657 15  <=34 U/L Final   ??? ALT 03/04/2021 13  10 - 49 U/L Final   ??? Alkaline Phosphatase 03/04/2021 165 (A) 46 - 116 U/L Final   ??? Magnesium 03/04/2021 1.6  1.6 - 2.6 mg/dL Final   ??? WBC 84/69/6295 3.6  3.6 - 11.2 10*9/L Final   ??? RBC 03/04/2021 2.53 (A) 3.95 - 5.13 10*12/L Final   ??? HGB 03/04/2021 8.3 (A) 11.3 - 14.9 g/dL Final   ??? HCT 28/41/3244 24.4 (A) 34.0 - 44.0 % Final   ??? MCV 03/04/2021 96.4 (A) 77.6 -  95.7 fL Final   ??? MCH 03/04/2021 32.8 (A) 25.9 - 32.4 pg Final   ??? MCHC 03/04/2021 34.0  32.0 - 36.0 g/dL Final   ??? RDW 16/08/9603 14.4  12.2 - 15.2 % Final   ??? MPV 03/04/2021 7.6  6.8 - 10.7 fL Final   ??? Platelet 03/04/2021 447  150 - 450 10*9/L Final   ??? Neutrophils % 03/04/2021 75.1  % Final   ??? Lymphocytes % 03/04/2021 10.4  % Final   ??? Monocytes % 03/04/2021 11.7  % Final   ??? Eosinophils % 03/04/2021 1.5  % Final   ??? Basophils % 03/04/2021 1.3  % Final   ??? Absolute Neutrophils 03/04/2021 2.7  1.8 - 7.8 10*9/L Final   ??? Absolute Lymphocytes 03/04/2021 0.4 (A) 1.1 - 3.6 10*9/L Final   ??? Absolute Monocytes 03/04/2021 0.4  0.3 - 0.8 10*9/L Final   ??? Absolute Eosinophils 03/04/2021 0.1  0.0 - 0.5 10*9/L Final   ??? Absolute Basophils 03/04/2021 0.0  0.0 - 0.1 10*9/L Final

## 2021-03-05 NOTE — Unmapped (Signed)
Patient ambulated in room, oxygen saturation remained 94%

## 2021-03-05 NOTE — Unmapped (Signed)
ED Progress Note    Briefly this is a 65 year old female with a complex past medical history including hypertension, A. fib on Eliquis, COPD, recurrent head and neck cancer who is presenting from the infusion center today with a brief episode of hypoxia into the 70s with associated hypotension that resolved after crystalloid infusion.  She was placed on a nonrebreather and her oxygen saturations quickly improved and normalized.  She had some continued mild hypoxia on room air on presentation to the ED at 89% however has been transitioned off of oxygen while in the emergency department.  Her work-up has been thorough including labs and the CTA of the chest.  Overall her work-up has been unremarkable, she has some bilateral medial groundglass opacities that could indicate aspiration versus infection.  She is currently not neutropenic and has not been on historical view of her blood work.  At this time she has been stable in nontoxic during her ER observation, she initially was going to be admitted however discussion was had with the inpatient team to wait until her CT results had come back.  With an overall reassuring CT without evidence of acute or life-threatening etiology of her episode patient would very much like to be discharged.  She states that she feels at her baseline and would like to go home.  I have offered her admission and reviewed potential risk of discharge.  She understands and would still like to be discharged.  We will place her on a course of Augmentin to treat potential aspiration pneumonia.  She understands need to follow-up with her primary and oncology team within the next 1 week for reevaluation.  Return precautions have also been reviewed.

## 2021-03-07 LAB — PINK EXTRA TUBE

## 2021-03-11 ENCOUNTER — Ambulatory Visit
Admit: 2021-03-11 | Payer: MEDICARE | Attending: Student in an Organized Health Care Education/Training Program | Primary: Student in an Organized Health Care Education/Training Program

## 2021-03-11 ENCOUNTER — Ambulatory Visit: Admit: 2021-03-11 | Payer: MEDICARE

## 2021-03-11 NOTE — Unmapped (Signed)
called patient to reschedule appoint she missed today but received no answer

## 2021-03-11 NOTE — Unmapped (Unsigned)
Head and Ne[facility]cology [facility]  Referring Physician: [facility] Charissa Bash  [address]rd [id number]  [address]  [place],  [place] [address]. [phone]    PCP: Haywood Pao, MD    Consulting providers:  Otolaryngology: Dr. Roma Schanz   Radiation Oncology: N/A    Reason for Visit: office visit, follow up on treatment     Assessment/Plan:    Marissa Kelly is a 65 y.o. female with history of hypertension, a-fib, COPD and recurrent head and neck cancer. 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 Diagnosed with biopsy-proven recurrence and started on neoadjuvant chemotherapy with Keis regimen. Goal is for symptom control and to improve surgical outcomes. C1D1 02/11/2021.     # Recurrent SCC right maxillary sinus   - Primary location is right pterygopalatine fossa with V2/V3 involvement with extension into skull base. Based on MRI has unresectable, locally advanced SNSCC   - Treating with neoadjvuant therapy with Keis regimen followed by re-evaluation for surgical resection.??Treatment goals include symptom control and chance to improve surgical outcomes. However, patient understands that this is an aggressive cancer and if she does not respond, we will continue same treatment regimen but with palliative intent   - Labs reviewed and acceptable for C1D22.  ANC 2.7, Hgb 8.3.    - will proceed with full dose today and reassess HGB next week.  Discussed poss need for transfusion to maintain dose.  She is agreeable to this plan      # Hypersensitivity reaction   - Secondary to Carboplatin   - Treated with Pepcid, Methylprednisolone with resolvution of symptoms   - ***    # Lower extremity peripheral neuropathy   - Secondary to Cisplatin, has not worsened.  Denies falls, difficulty ambulating.    - Will proceed with treatment today; however, if symptoms progress we will consider DR at next visit     # Supportive care  - Cancer-related pain: managed on current regimen; fentanyl 100 mcg buccal tablet, percocet 5-325 mg q6h prn.  gabapentin to 600 mg TID.  - Smoking Cessation: currently smoking about half pack daily. She has deferred assistance at this time.   - Constipation: none   - Access: Port   - Psychosocial:  denies any need for CCSP at this time.     Follow up: 1 week        --------------------------  Interval History  - Patient developed altered mental status, hypotension and desaturation following 77 mL of Carboplatin on 5/3. She received fluids, Pepcid and steroids prior to arriving to the ED. By that time, majority of her symptoms had resolved.     ***  LLE nauropathy continues, no worse  - denies falls  - weight down minimally, stable compared to last 2 weeks  - denies n/v, constipation, diarrhea, rash, urticaria  -continues smoking ~ 0.5 ppd  - decreased energy 2-3 days after treatment, not fully recovering this week    History of Present Illness:  Marissa Kelly is a 65 y.o. female with history of hypertension a-fib, COPD, who presents for evaluation of head and neck cancer. I have reviewed their records including history, imaging, pathology reports, and, when applicable, operative notes and summarized her oncologic history below:    Patient presented to the ED in September 2021 for evaluation of 6 week right-sided facial swelling and pain. Pain first started as a  small oral ulcer that progressively worsened, causing difficulty with mastication and oral intake. CT maxillofacial obtained in the ED was notable for bulky and indistinct enhancing tumor which has agressively destroyed the right maxilla, infiltrates along the right buccal space, floor of right nasal cavity, into the right pterygoid palatine fossa, right orbital apex, right soft palate and palatine tonsil. Furthermore, there was evidence of intracranial extension along the right V2 at the foramen rotundum. There was also evidence of right level 1A lymph node measuring 7 mm.     Patient was referred to oncology for further evaluation. Biopsy revealed invasive SCC, well differentiating. She completed concurrent chemoradiation therapy in December 2021. Patient tolerated chemo without serve side effects or toxicity but reports some difficulty with the radiation treatment, endorsing recurrent hospitalizations for a-fib and pneumonia. Felt her symptoms improved following treatment, was eating more solid foods.    Unfortunately, she developed recurrent facial swelling about 1-2 months after completing treatment. There was concern for sinus infection, underwent ear tube placement without any resolution in pain or swelling. She endorses throat pain, mostly eating liquids and soft foods.     Repeat scans from 01/27/21 were notable for recurrent disease in the right pterygopalatine fossa, along V2/V3 extending into the cavernous sinus and orbital fissure, as possible perineural spread.     She describes mild right-sided otalgia and facial numbness/weakness. Experienced similar symptoms prior to initial diagnosis that never entirely resolved. She reports ongoing dysphagia but attributes this to radiation therapy. Denies nausea or vomiting.     Patient is a current smoker, reports 50 year pack history. Denies heavy alcohol use.     Additional history is given by roommate on the phone .    Past Medical History:   Diagnosis Date   ??? Hypertension        No past surgical history on file.    Current Outpatient Medications   Medication Sig Dispense Refill   ??? acetaminophen (TYLENOL) 500 MG tablet Take by mouth.     ??? albuterol HFA 90 mcg/actuation inhaler Inhale 2 puffs every six (6) hours as needed.      ??? ALBUTEROL INHL Inhale.     ??? amiodarone (PACERONE) 200 MG tablet Take 1 tablet (200 mg total) by mouth daily. 30 tablet 0   ??? amLODIPine (NORVASC) 5 MG tablet Take 5 mg by mouth daily.     ??? amoxicillin-clavulanate (AUGMENTIN) 875-125 mg per tablet Take 1 tablet by mouth Two (2) times a day for 10 days. 20 tablet 0   ??? apixaban (ELIQUIS) 5 mg Tab Take 5 mg by mouth Two (2) times a day.     ??? docusate sodium (COLACE) 100 MG capsule Take 1 capsule by mouth daily.     ??? fentaNYL 37.5 mcg/hour PT72      ??? filgrastim (NEUPOGEN) 300 mcg/0.5 mL Syrg Inject 0.5 mL (300 mcg total) under the skin daily. For 2 days each week of chemotherapy. Do not administer within 24 hours of chemotherapy. 4 mL 0   ??? folic acid/multivit-min/lutein (CENTRUM SILVER ORAL) Take 1 tablet by mouth.     ??? food supplemt, lactose-reduced (ENSURE ACTIVE HIGH PROTEIN) Liqd Take 237 mL by mouth.     ??? furosemide (LASIX) 20 MG tablet Take 20 mg by mouth.     ??? gabapentin (NEURONTIN) 300 MG capsule Take 2 capsules (600 mg total) by mouth Three (3) times a day. 270 capsule 0   ??? ibuprofen (ADVIL,MOTRIN) 800 MG tablet Take 800 mg  by mouth every six (6) hours as needed for pain.     ??? lidocaine-prilocaine (EMLA) 2.5-2.5 % cream      ??? lisinopriL (PRINIVIL,ZESTRIL) 20 MG tablet Take 1 tablet (20 mg total) by mouth daily. 30 tablet 0   ??? magnesium chloride (MAG 64) 64 mg magnesium TbEC Take 64 mg by mouth daily. 2 times daily     ??? melatonin 10 mg Tab Take 10 mg by mouth.     ??? mirtazapine (REMERON) 15 MG tablet Take 15 mg by mouth nightly.     ??? ondansetron (ZOFRAN) 8 MG tablet Take 1 tablet (8 mg total) by mouth every eight (8) hours as needed for nausea (or vomiting). 30 tablet 2   ??? oxyCODONE-acetaminophen (PERCOCET) 5-325 mg per tablet Take 2 tablets by mouth every six (6) hours as needed for pain.     ??? potassium chloride (KAYCIEL) 20 mEq/15 mL solution Take 40 mEq by mouth daily. Take 30 m l (40 MEQ) po daily     ??? prochlorperazine (COMPAZINE) 10 MG tablet Take 1 tablet (10 mg total) by mouth every six (6) hours as needed (Nausea/Vomiting). 30 tablet 2   ??? VENTOLIN HFA 90 mcg/actuation inhaler Inhale 2 puffs every six (6) hours as needed.        No current facility-administered medications for this visit.       Allergies   Allergen Reactions   ??? Naproxen Other (See Comments), Rash and Swelling   ??? Aspirin Other (See Comments)   ??? Belladonna    ??? Phenobarbital Other (See Comments)   ??? Fluoxetine Rash   ??? Paroxetine Hcl Rash       Social History     Tobacco Use   ??? Smoking status: Never Smoker   ??? Smokeless tobacco: Never Used   Substance Use Topics   ??? Alcohol use: Not on file   ??? Drug use: Not on file       Social History     Social History Narrative   ??? Not on file       Family History   Problem Relation Age of Onset   ??? Skin cancer Neg Hx        Review of Systems: A 12-system review of systems was obtained including: Constitutional, Eyes, ENT, Cardiovascular, Respiratory, GI, GU, Musculoskeletal, Skin, Neurological, Psychiatric, Endocrine, Heme/Lymphatic, and Allergic/Immunologic systems. It is negative or non-contributory to the patient???s management except for as stated in patient's HPI    ECOG PS: 1    Physical Examination:  Vital Signs: There were no vitals taken for this visit.  CONSTITUTIONAL: Pleasant, tired,  woman; NAD  Head and Face:  Mild orbital edema   Hearing:  Normal hearing to spoken voice.  Oral Cavity: moist mucosa, tongue is mobile; tumor noted to right posterior hard palate, open area ~ 1.5 cm  Lymphatics: No palpable lymphadenopathy   CV: RRR; no lower extremity edema, no murmur  RESP: normal work of breathing, clear bilaterally, no wheezing   GI: Soft, non-tender, non-distended  SKIN: No rashes or ecchymoses, warm and dry, fair turgor  NEURO: Alert and oriented,  numbness of right face consistent with V2/V3 involvement, speech garbaled, gait steady with walker  PSYCH: Normal mood and appropriate affect    LABS  Lab Results   Component Value Date    WBC 2.6 (L) 03/04/2021    HGB 8.4 (L) 03/04/2021    HCT 25.0 (L) 03/04/2021    PLT 432 03/04/2021  Lab Results   Component Value Date    NA 134 (L) 03/04/2021    NA 135 03/04/2021    K 3.4 03/04/2021    K 3.5 03/04/2021    CL 101 03/04/2021    CO2 25.0 03/04/2021    BUN 29 (H) 03/04/2021    CREATININE 0.58 (L) 03/04/2021    GLU 127 03/04/2021    CALCIUM 8.4 (L) 03/04/2021    MG 1.6 03/04/2021       Lab Results   Component Value Date    BILITOT 0.2 (L) 03/04/2021    PROT 5.5 (L) 03/04/2021    ALBUMIN 2.3 (L) 03/04/2021    ALT 9 (L) 03/04/2021    AST 12 03/04/2021    ALKPHOS 139 (H) 03/04/2021       Lab Results   Component Value Date    PT 22.6 (H) 03/04/2021    INR 1.94 03/04/2021       IMAGING    MRI Arlys John (01/27/2021)   - Findings as above suggestive of tumor in the right pterygopalatine fossa, along V2/V3 extending into the cavernous sinus and orbital fissure suspicious for perineural spread /recurrent disease.      PATHOLOGY  Right palate biopsy (07/25/20)   Diagnosis   Invasive squamous cell carcinoma (well differentiated, keratinizing)    ??    Scribe's Attestation: Ginette Otto, DO obtained and performed the history, physical exam and medical decision making elements that were entered into the chart.  Signed by Jerl Santos, Scribe, on ***.    {*** NOTE TO PROVIDER: PLEASE ADD ATTESTATION NOTING YOU AGREE WITH SCRIBE DOCUMENTATION}

## 2021-03-18 DIAGNOSIS — C760 Malignant neoplasm of head, face and neck: Principal | ICD-10-CM

## 2021-03-18 NOTE — Unmapped (Signed)
Hi,     Marissa Kelly contacted the Communication Center requesting to speak with the care team of Marissa Kelly to discuss:    Patient returned call to reschedule appointment and infusion from 03/11/21.    Please contact Marissa Kelly at [phone].      Thank you,   Yolanda Bonine  [facility]   [phone]

## 2021-03-19 ENCOUNTER — Other Ambulatory Visit: Payer: Self-pay

## 2021-03-19 MED ORDER — AMIODARONE HCL 200 MG PO TABS: 200.0000 mg | ORAL_TABLET | Freq: Every day | ORAL | 1 refills | Status: DC

## 2021-03-19 NOTE — Telephone Encounter (Signed)
*  STAT* If patient is at the pharmacy, call can be transferred to refill team.   1. Which medications need to be refilled? (please list name of each medication and dose if known) Lisinopril and Amiodarone  2. Which pharmacy/location (including street and city if local pharmacy) is medication to be sent to? [facility]   3. Do they need a 30 day or 90 day supply? [place]

## 2021-03-25 ENCOUNTER — Ambulatory Visit
Admit: 2021-03-25 | Discharge: 2021-03-26 | Disposition: A | Discharge status: Home / Self Care | Payer: MEDICARE | Attending: Student in an Organized Health Care Education/Training Program | Primary: Student in an Organized Health Care Education/Training Program

## 2021-03-25 ENCOUNTER — Other Ambulatory Visit: Admit: 2021-03-25 | Discharge: 2021-03-26 | Disposition: A | Discharge status: Home / Self Care | Payer: MEDICARE

## 2021-03-25 ENCOUNTER — Ambulatory Visit
Admit: 2021-03-25 | Discharge: 2021-03-26 | Disposition: A | Discharge status: Home / Self Care | Payer: MEDICARE | Attending: Student in an Organized Health Care Education/Training Program

## 2021-03-25 ENCOUNTER — Emergency Department: Admit: 2021-03-25 | Discharge: 2021-03-26 | Disposition: A | Discharge status: Home / Self Care | Payer: MEDICARE

## 2021-03-25 ENCOUNTER — Emergency Department
Admit: 2021-03-25 | Discharge: 2021-03-26 | Disposition: A | Discharge status: Home / Self Care | Payer: MEDICARE | Attending: Student in an Organized Health Care Education/Training Program

## 2021-03-25 ENCOUNTER — Ambulatory Visit: Admit: 2021-03-25 | Discharge: 2021-03-26 | Disposition: A | Discharge status: Home / Self Care | Payer: MEDICARE

## 2021-03-25 DIAGNOSIS — C760 Malignant neoplasm of head, face and neck: Principal | ICD-10-CM

## 2021-03-25 DIAGNOSIS — T7840XA Allergy, unspecified, initial encounter: Principal | ICD-10-CM

## 2021-03-25 LAB — COMPREHENSIVE METABOLIC PANEL
ALBUMIN: 2.5 g/dL — ABNORMAL LOW (ref 3.4–5.0)
ALBUMIN: 2.3 g/dL — ABNORMAL LOW (ref 3.4–5.0)
ALKALINE PHOSPHATASE: 126 U/L — ABNORMAL HIGH (ref 46–116)
ALKALINE PHOSPHATASE: 151 U/L — ABNORMAL HIGH (ref 46–116)
ALT (SGPT): <7 U/L — ABNORMAL LOW (ref 10–49)
ALT (SGPT): 17 U/L (ref 10–49)
ANION GAP: 5 mmol/L (ref 5–14)
ANION GAP: 5 mmol/L (ref 5–14)
AST (SGOT): 13 U/L (ref <=34)
AST (SGOT): 21 U/L (ref <=34)
BILIRUBIN TOTAL: <0.2 mg/dL — ABNORMAL LOW (ref 0.3–1.2)
BILIRUBIN TOTAL: 0.2 mg/dL — ABNORMAL LOW (ref 0.3–1.2)
BLOOD UREA NITROGEN: 12 mg/dL (ref 9–23)
BLOOD UREA NITROGEN: 9 mg/dL (ref 9–23)
BUN / CREAT RATIO: 38
BUN / CREAT RATIO: 31
CALCIUM: 8.7 mg/dL (ref 8.7–10.4)
CALCIUM: 7.7 mg/dL — ABNORMAL LOW (ref 8.7–10.4)
CHLORIDE: 102 mmol/L (ref 98–107)
CHLORIDE: 107 mmol/L (ref 98–107)
CO2: 30 mmol/L (ref 20.0–31.0)
CO2: 24 mmol/L (ref 20.0–31.0)
CREATININE: 0.32 mg/dL — ABNORMAL LOW
CREATININE: 0.29 mg/dL — ABNORMAL LOW
EGFR CKD-EPI (2021) FEMALE: >90 mL/min/{1.73_m2} (ref >=60)
EGFR CKD-EPI (2021) FEMALE: >90 mL/min/{1.73_m2} (ref >=60)
GLUCOSE RANDOM: 84 mg/dL (ref 70–179)
GLUCOSE RANDOM: 192 mg/dL — ABNORMAL HIGH (ref 70–179)
POTASSIUM: 3.7 mmol/L (ref 3.4–4.8)
POTASSIUM: 3.2 mmol/L — ABNORMAL LOW (ref 3.4–4.8)
PROTEIN TOTAL: 5.8 g/dL (ref 5.7–8.2)
PROTEIN TOTAL: 5.5 g/dL — ABNORMAL LOW (ref 5.7–8.2)
SODIUM: 137 mmol/L (ref 135–145)
SODIUM: 136 mmol/L (ref 135–145)

## 2021-03-25 LAB — CBC W/ AUTO DIFF
BASOPHILS ABSOLUTE COUNT: 0.1 10*9/L (ref 0.0–0.1)
BASOPHILS ABSOLUTE COUNT: 0 10*9/L (ref 0.0–0.1)
BASOPHILS RELATIVE PERCENT: 1.1 %
BASOPHILS RELATIVE PERCENT: 0.2 %
EOSINOPHILS ABSOLUTE COUNT: 0.2 10*9/L (ref 0.0–0.5)
EOSINOPHILS ABSOLUTE COUNT: 0 10*9/L (ref 0.0–0.5)
EOSINOPHILS RELATIVE PERCENT: 1.1 %
EOSINOPHILS RELATIVE PERCENT: 0.1 %
HEMATOCRIT: 27 % — ABNORMAL LOW (ref 34.0–44.0)
HEMATOCRIT: 28.7 % — ABNORMAL LOW (ref 34.0–44.0)
HEMOGLOBIN: 8.8 g/dL — ABNORMAL LOW (ref 11.3–14.9)
HEMOGLOBIN: 9.2 g/dL — ABNORMAL LOW (ref 11.3–14.9)
LYMPHOCYTES ABSOLUTE COUNT: 0.7 10*9/L — ABNORMAL LOW (ref 1.1–3.6)
LYMPHOCYTES ABSOLUTE COUNT: 0.3 10*9/L — ABNORMAL LOW (ref 1.1–3.6)
LYMPHOCYTES RELATIVE PERCENT: 5.4 %
LYMPHOCYTES RELATIVE PERCENT: 1.1 %
MEAN CORPUSCULAR HEMOGLOBIN CONC: 32.5 g/dL (ref 32.0–36.0)
MEAN CORPUSCULAR HEMOGLOBIN CONC: 32.1 g/dL (ref 32.0–36.0)
MEAN CORPUSCULAR HEMOGLOBIN: 32 pg (ref 25.9–32.4)
MEAN CORPUSCULAR HEMOGLOBIN: 31.8 pg (ref 25.9–32.4)
MEAN CORPUSCULAR VOLUME: 98.5 fL — ABNORMAL HIGH (ref 77.6–95.7)
MEAN CORPUSCULAR VOLUME: 99 fL — ABNORMAL HIGH (ref 77.6–95.7)
MEAN PLATELET VOLUME: 6.4 fL — ABNORMAL LOW (ref 6.8–10.7)
MEAN PLATELET VOLUME: 6.4 fL — ABNORMAL LOW (ref 6.8–10.7)
MONOCYTES ABSOLUTE COUNT: 1.3 10*9/L — ABNORMAL HIGH (ref 0.3–0.8)
MONOCYTES ABSOLUTE COUNT: 0.7 10*9/L (ref 0.3–0.8)
MONOCYTES RELATIVE PERCENT: 9.7 %
MONOCYTES RELATIVE PERCENT: 2.6 %
NEUTROPHILS ABSOLUTE COUNT: 11.2 10*9/L — ABNORMAL HIGH (ref 1.8–7.8)
NEUTROPHILS ABSOLUTE COUNT: 25.5 10*9/L — ABNORMAL HIGH (ref 1.8–7.8)
NEUTROPHILS RELATIVE PERCENT: 82.7 %
NEUTROPHILS RELATIVE PERCENT: 96 %
PLATELET COUNT: 535 10*9/L — ABNORMAL HIGH (ref 150–450)
PLATELET COUNT: 511 10*9/L — ABNORMAL HIGH (ref 150–450)
RED BLOOD CELL COUNT: 2.74 10*12/L — ABNORMAL LOW (ref 3.95–5.13)
RED BLOOD CELL COUNT: 2.9 10*12/L — ABNORMAL LOW (ref 3.95–5.13)
RED CELL DISTRIBUTION WIDTH: 19.2 % — ABNORMAL HIGH (ref 12.2–15.2)
RED CELL DISTRIBUTION WIDTH: 18.6 % — ABNORMAL HIGH (ref 12.2–15.2)
WBC ADJUSTED: 13.6 10*9/L — ABNORMAL HIGH (ref 3.6–11.2)
WBC ADJUSTED: 26.5 10*9/L — ABNORMAL HIGH (ref 3.6–11.2)

## 2021-03-25 LAB — BLOOD GAS, VENOUS
BASE EXCESS VENOUS: -2.8 — ABNORMAL LOW (ref -2.0–2.0)
HCO3 VENOUS: 21 mmol/L — ABNORMAL LOW (ref 22–27)
O2 SATURATION VENOUS: 87.6 % — ABNORMAL HIGH (ref 40.0–85.0)
PCO2 VENOUS: 35 mmHg — ABNORMAL LOW (ref 40–60)
PH VENOUS: 7.4 (ref 7.32–7.43)
PO2 VENOUS: 59 mmHg — ABNORMAL HIGH (ref 30–55)

## 2021-03-25 LAB — HIGH SENSITIVITY TROPONIN I - 2 HOUR SERIAL
HIGH SENSITIVITY TROPONIN - DELTA (0-2H): 1 ng/L (ref <=7)
HIGH-SENSITIVITY TROPONIN I - 2 HOUR: 7 ng/L (ref <=34)

## 2021-03-25 LAB — MAGNESIUM: MAGNESIUM: 1.6 mg/dL (ref 1.6–2.6)

## 2021-03-25 LAB — HIGH SENSITIVITY TROPONIN I - SERIAL: HIGH SENSITIVITY TROPONIN I: 8 ng/L (ref <=34)

## 2021-03-25 MED ORDER — EPINEPHRINE 0.3 MG/0.3 ML INJECTION, AUTO-INJECTOR
Freq: Once | INTRAMUSCULAR | 0 refills | 0.00000 days | Status: CP | PRN
Start: 2021-03-25 — End: 2022-03-25

## 2021-03-25 MED ADMIN — sodium chloride 0.9% (NS) bolus 1,000 mL: 1000 mL | INTRAVENOUS | @ 20:00:00 | Stop: 2021-03-25

## 2021-03-25 MED ADMIN — ondansetron (ZOFRAN) tablet 24 mg: 24 mg | ORAL | @ 15:00:00 | Stop: 2021-03-25

## 2021-03-25 MED ADMIN — sodium chloride 0.9% (NS) bolus 500 mL: 500 mL | INTRAVENOUS | @ 18:00:00 | Stop: 2021-03-25

## 2021-03-25 MED ADMIN — famotidine (PF) (PEPCID) injection 20 mg: 20 mg | INTRAVENOUS | @ 19:00:00

## 2021-03-25 MED ADMIN — cetuximab (ERBITUX) IVPB 398 mg: 250 mg/m2 | INTRAVENOUS | @ 16:00:00 | Stop: 2021-03-25

## 2021-03-25 MED ADMIN — fosaprepitant (EMEND) 150 mg in sodium chloride (NS) 0.9 % 100 mL IVPB: 150 mg | INTRAVENOUS | @ 15:00:00 | Stop: 2021-03-25

## 2021-03-25 MED ADMIN — dexAMETHasone (DECADRON) tablet 20 mg: 20 mg | ORAL | @ 15:00:00 | Stop: 2021-03-25

## 2021-03-25 MED ADMIN — diphenhydrAMINE (BENADRYL) injection 25 mg: 25 mg | INTRAVENOUS | @ 19:00:00

## 2021-03-25 MED ADMIN — methylPREDNISolone sodium succinate (PF) (Solu-MEDROL) injection 125 mg: 125 mg | INTRAVENOUS | @ 19:00:00

## 2021-03-25 MED ADMIN — famotidine (PF) (PEPCID) injection 20 mg: 20 mg | INTRAVENOUS | @ 15:00:00 | Stop: 2021-03-25

## 2021-03-25 MED ADMIN — CISplatin (PLATINOL) 47.7 mg, mannitol 12.5 g IVPB: 30 mg/m2 | INTRAVENOUS | @ 18:00:00 | Stop: 2021-03-25

## 2021-03-25 MED ADMIN — sodium chloride 0.9% (NS) bolus 1,000 mL: 1000 mL | INTRAVENOUS | @ 15:00:00 | Stop: 2021-03-25

## 2021-03-25 MED ADMIN — sodium chloride 0.9% (NS) bolus 1,000 mL: 1000 mL | INTRAVENOUS | @ 19:00:00

## 2021-03-25 MED ADMIN — oxyCODONE-acetaminophen (PERCOCET) 5-325 mg tablet 2 tablet: 2 | ORAL | @ 22:00:00 | Stop: 2021-03-25

## 2021-03-25 MED ADMIN — diphenhydrAMINE (BENADRYL) capsule/tablet 25 mg: 25 mg | ORAL | @ 15:00:00 | Stop: 2021-03-25

## 2021-03-25 MED ADMIN — PACLitaxel (TAXOL) 214.68 mg in sodium chloride NON-PVC (NS) 0.9 % 250 mL IVPB: 135 mg/m2 | INTRAVENOUS | @ 17:00:00 | Stop: 2021-03-25

## 2021-03-25 NOTE — Unmapped (Signed)
Bed: 75-D  Expected date:   Expected time:   Means of arrival:   Comments:  RESUS

## 2021-03-25 NOTE — Unmapped (Signed)
[facility]  PCP: Haywood Pao, MD    Consulting providers:  Otolaryngology: Dr. Roma Schanz   Radiation Oncology: N/A    Reason for Visit: office visit, follow up on treatment     Assessment/Plan:    Marissa Kelly is a 65 y.o. female with history of hypertension, a-fib, COPD and recurrent head and neck cancer. 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 Diagnosed with biopsy-proven recurrence and started on neoadjuvant chemotherapy with Keis regimen. Goal is for symptom control and to improve surgical outcomes. C1D1 02/11/2021. Patient developed hypersensitivity reaction to carboplatin and will transitioned to cisplatin (03/04/2021). Presents today to continue therapy after treatment break.    # Recurrent SCC right maxillary sinus   - Primary location is right pterygopalatine fossa with V2/V3 involvement with extension into skull base. Based on MRI has unresectable, locally advanced SNSCC   - Treating with neoadjvuant therapy with Keis regimen followed by re-evaluation for surgical resection.??Treatment goals include symptom control and chance to improve surgical outcomes. However, patient understands that this is an aggressive cancer and if she does not respond, we will continue same treatment regimen but with palliative intent   - Transitioned from carboplatin to cisplatin (03/04/2021) given hypersensitivity reaction, per below   - Missed last two weeks (5/10 & 5/17) due to weakness  - Labs reviewed and acceptable for C1D29  - Plan to treat for 1 additional cycle and scans in 2 weeks  - Will coordinate with Dr. Roma Schanz    # Hypersensitivity reaction   - Secondary to Carboplatin;  developed AMS, desaturation and hypotension   - Transitioned from carboplatin to cisplatin (03/04/2021)     # Lower extremity neuropathy   - Notconsistent with treatment related peripherial neuropathy   - Could be related to known degenerative arthritis of back and recommend continue scheduled tylenol to help manage symptoms   - Offered to obtain XR today to better evaluate for possible injury, as patient is unsure of any recent falls. She deferred a this time   - Will CTM; if neuropathy worsen, consider DR of cisplatin to see if this improves symptoms     # Supportive care  - Cancer-related pain: managed on current regimen; fentanyl 100 mcg buccal tablet; percocet 5-325 mg q6h prn; gabapentin to 600 mg TID.  - Smoking Cessation: currently smoking about half pack daily. She has deferred assistance at this time.   - Constipation: none   - Access: Port   - Psychosocial:  denies any need for CCSP at this time.     Follow up: 1 week    Sergio Zawislak   Head and Neck Medical Oncology  [place] [facility] [place]   --------------------------  Interval History  - Patient developed altered mental status, hypotension and desaturation following 77 mL of Carboplatin on 5/3. She received fluids, Pepcid and steroids prior to arriving to the ED. By that time, majority of her symptoms had resolved.   - Few days following treatment, patient developed severe fatigue with difficulty getting out of bed and difficulty tolerating anything by mouth. Symptoms lasted about 10 days   - She is now starting to get her appetite back   - Experiencing continued pain   - Describes new onset right foot and left leg numbness/tingling, first noticed about 3 weeks ago. She localizes symptoms along lateral aspect of her leg and are constant in nature.  Not been diagnosed with sciatica in the past, denies known recent falls or injury to cause symptoms. Unsure if symptoms could be related to her known arthritis of back, which she is currently treating with prn tylenol   - Weight improved, denies n/v, constipation, diarrhea, rash, urticaria    History of Present Illness:  Marissa Kelly is a 65 y.o. female with history of hypertension a-fib, COPD, who presents for evaluation of head and neck cancer. I have reviewed their records including history, imaging, pathology reports, and, when applicable, operative notes and summarized her oncologic history below:    Patient presented to the ED in September 2021 for evaluation of 6 week right-sided facial swelling and pain. Pain first started as a small oral ulcer that progressively worsened, causing difficulty with mastication and oral intake. CT maxillofacial obtained in the ED was notable for bulky and indistinct enhancing tumor which has agressively destroyed the right maxilla, infiltrates along the right buccal space, floor of right nasal cavity, into the right pterygoid palatine fossa, right orbital apex, right soft palate and palatine tonsil. Furthermore, there was evidence of intracranial extension along the right V2 at the foramen rotundum. There was also evidence of right level 1A lymph node measuring 7 mm.     Patient was referred to oncology for further evaluation. Biopsy revealed invasive SCC, well differentiating. She completed concurrent chemoradiation therapy in December 2021. Patient tolerated chemo without serve side effects or toxicity but reports some difficulty with the radiation treatment, endorsing recurrent hospitalizations for a-fib and pneumonia. Felt her symptoms improved following treatment, was eating more solid foods.    Unfortunately, she developed recurrent facial swelling about 1-2 months after completing treatment. There was concern for sinus infection, underwent ear tube placement without any resolution in pain or swelling. She endorses throat pain, mostly eating liquids and soft foods.     Repeat scans from 01/27/21 were notable for recurrent disease in the right pterygopalatine fossa, along V2/V3 extending into the cavernous sinus and orbital fissure, as possible perineural spread.     She describes mild right-sided otalgia and facial numbness/weakness. Experienced similar symptoms prior to initial diagnosis that never entirely resolved. She reports ongoing dysphagia but attributes this to radiation therapy. Denies nausea or vomiting.     Patient is a current smoker, reports 50 year pack history. Denies heavy alcohol use.     Additional history is given by roommate on the phone .    Past Medical History:   Diagnosis Date   ??? Hypertension        No past surgical history on file.    Current Outpatient Medications   Medication Sig Dispense Refill   ??? acetaminophen (TYLENOL) 500 MG tablet Take by mouth.     ??? albuterol HFA 90 mcg/actuation inhaler Inhale 2 puffs every six (6) hours as needed.      ??? ALBUTEROL INHL Inhale.     ??? amiodarone (PACERONE) 200 MG tablet Take 1 tablet (200 mg total) by mouth daily. 30 tablet 0   ??? amLODIPine (NORVASC) 5 MG tablet Take 5 mg by mouth daily.     ??? apixaban (ELIQUIS) 5 mg Tab Take 5 mg by mouth Two (2) times a day.     ??? docusate sodium (COLACE) 100 MG capsule Take 1 capsule by mouth daily.     ??? fentaNYL 37.5 mcg/hour PT72      ??? filgrastim (NEUPOGEN) 300 mcg/0.5 mL Syrg Inject 0.5 mL (300 mcg total) under the skin daily.  For 2 days each week of chemotherapy. Do not administer within 24 hours of chemotherapy. 4 mL 0   ??? folic acid/multivit-min/lutein (CENTRUM SILVER ORAL) Take 1 tablet by mouth.     ??? food supplemt, lactose-reduced (ENSURE ACTIVE HIGH PROTEIN) Liqd Take 237 mL by mouth.     ??? furosemide (LASIX) 20 MG tablet Take 20 mg by mouth.     ??? gabapentin (NEURONTIN) 300 MG capsule Take 2 capsules (600 mg total) by mouth Three (3) times a day. 270 capsule 0   ??? ibuprofen (ADVIL,MOTRIN) 800 MG tablet Take 800 mg by mouth every six (6) hours as needed for pain.     ??? lidocaine-prilocaine (EMLA) 2.5-2.5 % cream      ??? lisinopriL (PRINIVIL,ZESTRIL) 20 MG tablet Take 1 tablet (20 mg total) by mouth daily. 30 tablet 0   ??? magnesium chloride (MAG 64) 64 mg magnesium TbEC Take 64 mg by mouth daily. 2 times daily     ??? melatonin 10 mg Tab Take 10 mg by mouth. ??? mirtazapine (REMERON) 15 MG tablet Take 15 mg by mouth nightly.     ??? ondansetron (ZOFRAN) 8 MG tablet Take 1 tablet (8 mg total) by mouth every eight (8) hours as needed for nausea (or vomiting). 30 tablet 2   ??? oxyCODONE-acetaminophen (PERCOCET) 5-325 mg per tablet Take 2 tablets by mouth every six (6) hours as needed for pain.     ??? potassium chloride (KAYCIEL) 20 mEq/15 mL solution Take 40 mEq by mouth daily. Take 30 m l (40 MEQ) po daily     ??? prochlorperazine (COMPAZINE) 10 MG tablet Take 1 tablet (10 mg total) by mouth every six (6) hours as needed (Nausea/Vomiting). 30 tablet 2   ??? VENTOLIN HFA 90 mcg/actuation inhaler Inhale 2 puffs every six (6) hours as needed.        No current facility-administered medications for this visit.       Allergies   Allergen Reactions   ??? Naproxen Other (See Comments), Rash and Swelling   ??? Aspirin Other (See Comments)   ??? Belladonna    ??? Phenobarbital Other (See Comments)   ??? Fluoxetine Rash   ??? Paroxetine Hcl Rash       Social History     Tobacco Use   ??? Smoking status: Never Smoker   ??? Smokeless tobacco: Never Used       Social History     Social History Narrative   ??? Not on file       Family History   Problem Relation Age of Onset   ??? Skin cancer Neg Hx        Review of Systems: A 12-system review of systems was obtained including: Constitutional, Eyes, ENT, Cardiovascular, Respiratory, GI, GU, Musculoskeletal, Skin, Neurological, Psychiatric, Endocrine, Heme/Lymphatic, and Allergic/Immunologic systems. It is negative or non-contributory to the patient???s management except for as stated in patient's HPI    ECOG PS: 1    Physical Examination:  Vital Signs: BP 129/60  - Pulse 67  - Temp 36.8 ??C (98.3 ??F) (Tympanic)  - Resp 16  - Ht 167.6 cm (5' 6)  - Wt 51.1 kg (112 lb 9.6 oz)  - SpO2 97%  - BMI 18.17 kg/m??   CONSTITUTIONAL: Pleasant, tired,  woman; NAD  Head and Face:  Mild orbital edema   Hearing:  Normal hearing to spoken voice.  Oral Cavity: dry oral mucosa with cracking on lips, tumor noted to right posterior hard palate, open  area ~ 1.5 cm  Lymphatics: No palpable lymphadenopathy   CV: RRR; no lower extremity edema  RESP: normal work of breathing  GI: Soft, non-tender  SKIN: No rashes or ecchymoses  NEURO: Alert and oriented,  numbness of right face consistent with V2/V3 involvement, speech garbaled, gait steady with walker  PSYCH: Normal mood and appropriate affect    LABS  Lab Results   Component Value Date    WBC 13.6 (H) 03/25/2021    HGB 8.8 (L) 03/25/2021    HCT 27.0 (L) 03/25/2021    PLT 535 (H) 03/25/2021       Lab Results   Component Value Date    NA 137 03/25/2021    K 3.7 03/25/2021    CL 102 03/25/2021    CO2 30.0 03/25/2021    BUN 12 03/25/2021    CREATININE 0.32 (L) 03/25/2021    GLU 84 03/25/2021    CALCIUM 8.7 03/25/2021    MG 1.6 03/25/2021       Lab Results   Component Value Date    BILITOT <0.2 (L) 03/25/2021    PROT 5.8 03/25/2021    ALBUMIN 2.5 (L) 03/25/2021    ALT <7 (L) 03/25/2021    AST 13 03/25/2021    ALKPHOS 126 (H) 03/25/2021       Lab Results   Component Value Date    PT 22.6 (H) 03/04/2021    INR 1.94 03/04/2021       IMAGING  MRI Ar[facility] John (01/27/2021)   - Findings as above suggestive of tumor in the right pterygopalatine fossa, along V2/V3 extending into the cavernous sinus and orbital fissure suspicious for perineural spread /recurrent disease.    CTA Chest (03/04/2021)   - No evidence of acute pulmonary embolus.  - Scattered groundless opacities, most prominent in the medial bilateral lower lobes which concerning for aspiration and/or infectious process.    PATHOLOGY  Right palate biopsy (07/25/20)   Diagnosis   Invasive squamous cell carcinoma (well differentiated, keratinizing)    ??  Scribe's Attestation: Ginette Otto, DO obtained and performed the history, physical exam and medical decision making elements that were entered into the chart.  Signed by Jerl Santos, Scribe, on Mar 25, 2021 10:04 AM    ----------------------------------------------------------------------------------------------------------------------  Documentation assistance provided by the Scribe. I was present during the time the encounter was recorded. The information recorded by the Scribe was done at my direction and has been reviewed and validated by me.  ----------------------------------------------------------------------------------------------------------------------

## 2021-03-25 NOTE — Unmapped (Signed)
Code medic from infusion clinic for SOB, hypoxemia, and hypotension.

## 2021-03-25 NOTE — Unmapped (Signed)
Bed: 77-D  Expected date:   Expected time:   Means of arrival:   Comments:  Pt from 75

## 2021-03-25 NOTE — Unmapped (Signed)
Hematology/Oncology Brief Infusion Transfer to ED Communication Note    Marissa Kelly is a 65 y.o. female with a diagnosis of cancer of head and neck who is Infusion Clinic for C1D29 of cisplatin and mannitol. She had received 30 ml of therapy when she became diaphoretic, hypontensive and unresponsive. Also noted to be hypoxemic with sat in the low 70s. Started on a non re breather mask and ARRT called. Pt transferred to the ED for further management      HPI/Reason for transfer: She is being transferred to the ED today due to hypersensitivity reaction (anaphylaxis).    Plan for infusion center:   If the patient has an active treatment plan, chemotherapy to be administered: N/A  Infusions or supportive care ordered to be started in infusion: None  Additional testing/orders to be carried out prior to transfer: None    The preliminary plan and goals of transfer to the ED are: Management of hypersensitivity reaction (anaphylaxis)    The primary outpatient oncologist for this patient is Dr. Darleen Crocker, FNP

## 2021-03-25 NOTE — Unmapped (Signed)
[facility]  Emergency Department Provider Note    ED Clinical Impression     Final diagnoses:   Allergic reaction, initial encounter (Primary)       Initial Impression, ED Course, Assessment and Plan     Impression:  65 y.o. female with a past medical history of hypertension, A. 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  Patient arrived to the emergency department, she was on nonrebreather, this is able to be transitioned to 6 L of nasal cannula on arrival, initial blood pressure was 101/73.  Patient was mentating well at her baseline per infusion nurse.  She had no wheezing on exam, no urticaria, nontender abdomen.  Port without any erythema.  Concern is for infusion reaction, possibly hypersensitivity reaction given patient's prior history of the same with carboplatin per review of oncology notes.  Do not feel epinephrine is needed at this time as patient responded to medications prior to my evaluation.  Other considerations are for ACS, unlikely sepsis given no preceding symptoms prior to infusion, vasovagal, pneumonia.  Presentation does not seem consistent with dissection given no chest pain or back pain, symptoms improving.    Plan for cardiac work-up with EKG, troponin, basic labs.  We will also get a chest x-ray for concern of hypoxia.  We will also get a COVID test given hypoxia.  Will give additional liter of IV fluids.    ED Course as of 03/26/21 0944   Tue Mar 25, 2021   1719 Unfortunately patient's blood work was lost in transport to the lab, we are going had to redraw all of her labs.   1753 Now off of oxygen, satting 95% on RA.   1910 I spoke to the oncology fellow who also discussed with their attending, from their perspective there is no oncologic reason to keep patient in hospital she is clinically improved here in the ED.   2057 She ambulated the bathroom, she is feeling well, feels safe going home, but possibly staying with her tonight.  Discussed with her in detail return precautions.  Given concern for possible anaphylaxis earlier, will give her take home epi pen, discussed with her when to use this.       Additional Medical Decision Making     I have reviewed the vital signs and the nursing notes. Labs and radiology results that were available during my care of the patient were independently reviewed by me and considered in my medical decision making.     I staffed the case with the ED attending, Dr. Darryl Nestle.    I independently visualized the EKG tracing.   I independently visualized the radiology images.   I reviewed the patient's prior medical records that were available for viewing.   I discussed the case with the oncology consultant.     Portions of this record have been created using Scientist, clinical (histocompatibility and immunogenetics). Dictation errors have been sought, but may not have been identified and corrected.  ____________________________________________       History     Chief Complaint  Shortness of Breath      HPI   Marissa Kelly is a 65 y.o. female with a past medical history of hypertension, A. 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  Patient was upstairs, she had received 2 infusions today prior to receiving her cisplatin transfusion.  About 15 minutes the nurses possible transfusion she started becoming hypoxic, lowest blood pressure they subserous was systolics of 60.  They gave Pepcid, Benadryl, Solu-Medrol, 1 L IVF; did not receive epinephrine.  Patient was brought down to the ED following rapid response and infusion clinic.  Patient arrived to the ED on a nonrebreather.    Patient denies any chest pain, reported feeling nauseated but received Zofran prior to infusions upstairs.  Denied feeling short of breath, abdominal pain. Reports pain at right maxillary sinus from known cancer.      Past Medical History:   Diagnosis Date   ??? Hypertension        Patient Active Problem List   Diagnosis   ??? Head and neck cancer (CMS-HCC)       No past surgical history on file.    No current facility-administered medications for this encounter.    Current Outpatient Medications:   ???  acetaminophen (TYLENOL) 500 MG tablet, Take by mouth., Disp: , Rfl:   ???  albuterol HFA 90 mcg/actuation inhaler, Inhale 2 puffs every six (6) hours as needed. , Disp: , Rfl:   ???  ALBUTEROL INHL, Inhale., Disp: , Rfl:   ???  amiodarone (PACERONE) 200 MG tablet, Take 1 tablet (200 mg total) by mouth daily., Disp: 30 tablet, Rfl: 0  ???  amLODIPine (NORVASC) 5 MG tablet, Take 5 mg by mouth daily., Disp: , Rfl:   ???  apixaban (ELIQUIS) 5 mg Tab, Take 5 mg by mouth Two (2) times a day., Disp: , Rfl:   ???  docusate sodium (COLACE) 100 MG capsule, Take 1 capsule by mouth daily., Disp: , Rfl:   ???  EPINEPHrine (EPIPEN) 0.3 mg/0.3 mL injection, Inject 0.3 mL (0.3 mg total) into the muscle once as needed for anaphylaxis. May repeat once in 5-15 minutes if symptoms continue., Disp: 2 each, Rfl: 0  ???  fentaNYL 37.5 mcg/hour PT72, , Disp: , Rfl:   ???  filgrastim (NEUPOGEN) 300 mcg/0.5 mL Syrg, Inject 0.5 mL (300 mcg total) under the skin daily. For 2 days each week of chemotherapy. Do not administer within 24 hours of chemotherapy., Disp: 4 mL, Rfl: 0  ???  folic acid/multivit-min/lutein (CENTRUM SILVER ORAL), Take 1 tablet by mouth., Disp: , Rfl:   ???  food supplemt, lactose-reduced (ENSURE ACTIVE HIGH PROTEIN) Liqd, Take 237 mL by mouth., Disp: , Rfl:   ???  furosemide (LASIX) 20 MG tablet, Take 20 mg by mouth., Disp: , Rfl:   ???  gabapentin (NEURONTIN) 300 MG capsule, Take 2 capsules (600 mg total) by mouth Three (3) times a day., Disp: 270 capsule, Rfl: 0  ???  ibuprofen (ADVIL,MOTRIN) 800 MG tablet, Take 800 mg by mouth every six (6) hours as needed for pain., Disp: , Rfl:   ???  lidocaine-prilocaine (EMLA) 2.5-2.5 % cream, , Disp: , Rfl:   ???  lisinopriL (PRINIVIL,ZESTRIL) 20 MG tablet, Take 1 tablet (20 mg total) by mouth daily., Disp: 30 tablet, Rfl: 0  ???  magnesium chloride (MAG 64) 64 mg magnesium TbEC, Take 64 mg by mouth daily. 2 times daily, Disp: , Rfl:   ???  melatonin 10 mg Tab, Take 10 mg by mouth., Disp: , Rfl:   ???  mirtazapine (REMERON) 15 MG tablet, Take 15 mg by mouth nightly., Disp: , Rfl:   ???  ondansetron (ZOFRAN) 8 MG tablet, Take 1 tablet (8  mg total) by mouth every eight (8) hours as needed for nausea (or vomiting)., Disp: 30 tablet, Rfl: 2  ???  oxyCODONE-acetaminophen (PERCOCET) 5-325 mg per tablet, Take 2 tablets by mouth every six (6) hours as needed for pain., Disp: , Rfl:   ???  potassium chloride (KAYCIEL) 20 mEq/15 mL solution, Take 40 mEq by mouth daily. Take 30 m l (40 MEQ) po daily, Disp: , Rfl:   ???  prochlorperazine (COMPAZINE) 10 MG tablet, Take 1 tablet (10 mg total) by mouth every six (6) hours as needed (Nausea/Vomiting)., Disp: 30 tablet, Rfl: 2  ???  VENTOLIN HFA 90 mcg/actuation inhaler, Inhale 2 puffs every six (6) hours as needed. , Disp: , Rfl:     Allergies  Naproxen, Aspirin, Belladonna, Phenobarbital, Fluoxetine, and Paroxetine hcl    Family History   Problem Relation Age of Onset   ??? Skin cancer Neg Hx        Social History  Social History     Tobacco Use   ??? Smoking status: Never Smoker   ??? Smokeless tobacco: Never Used       Review of Systems  Constitutional: Negative for fever.  Eyes: Negative for visual changes.  ENT: Positive for right maxillary sinus pain.  CV: Negative for chest pain.  Resp: Positive for shortness of breath.  GI: Positive for nausea.  GU: Negative for dysuria.  MSK: Negative for back pain.  Derm: Negative for rash.  Neuro: Negative for headaches.      Physical Exam     ED Triage Vitals   Enc Vitals Group      BP 03/25/21 1504 101/73      Heart Rate 03/25/21 1505 104      SpO2 Pulse --       Resp 03/25/21 1505 16 Temp --       Temp src --       SpO2 03/25/21 1505 97 %       Constitutional: Appears stated age.  Chronically ill-appearing, mild distress  HEENT: Normocephalic and atraumatic.Conjunctivae clear. No congestion. Moist mucous membranes.   Heme/Lymph/Immuno: No petechiae or bruising  CV: RRR, no murmurs.   Resp: Clear to auscultation bilaterally. No wheezes or rhonchii  GI: Soft and non tender, non distended.   MSK: Normal range of motion in all extremities.  Neuro: Normal speech and language per patient's baseline. No gross focal neurologic deficits appreciated.  Skin: Warm, dry and intact.  Psychiatric: Mood and affect are normal. Speech and behavior are normal.    EKG     Sinus rhythm at 90 bpm with PVCs, QTC of 506, no ST elevations or depressions    Radiology     XR Chest 1 view Portable   Final Result      No acute airspace disease.            I personally reviewed the images and lab results for the patient.            Alfredia Ferguson, MD  Resident  03/26/21 [phone]

## 2021-03-26 DIAGNOSIS — C760 Malignant neoplasm of head, face and neck: Principal | ICD-10-CM

## 2021-03-26 LAB — COMPREHENSIVE METABOLIC PANEL
ALBUMIN: 2.4 g/dL — ABNORMAL LOW (ref 3.4–5.0)
ALKALINE PHOSPHATASE: 134 U/L — ABNORMAL HIGH (ref 46–116)
ALT (SGPT): 11 U/L (ref 10–49)
ANION GAP: 15 mmol/L — ABNORMAL HIGH (ref 5–14)
AST (SGOT): 27 U/L (ref <=34)
BILIRUBIN TOTAL: 0.2 mg/dL — ABNORMAL LOW (ref 0.3–1.2)
BLOOD UREA NITROGEN: 9 mg/dL (ref 9–23)
BUN / CREAT RATIO: 28
CALCIUM: 8.1 mg/dL — ABNORMAL LOW (ref 8.7–10.4)
CHLORIDE: 101 mmol/L (ref 98–107)
CO2: 20 mmol/L (ref 20.0–31.0)
CREATININE: 0.32 mg/dL — ABNORMAL LOW
EGFR CKD-EPI (2021) FEMALE: >90 mL/min/{1.73_m2} (ref >=60)
GLUCOSE RANDOM: 114 mg/dL (ref 70–179)
POTASSIUM: 3.6 mmol/L (ref 3.4–4.8)
PROTEIN TOTAL: 5.4 g/dL — ABNORMAL LOW (ref 5.7–8.2)
SODIUM: 136 mmol/L (ref 135–145)

## 2021-03-26 LAB — CBC W/ AUTO DIFF
BASOPHILS ABSOLUTE COUNT: 0 10*9/L (ref 0.0–0.1)
BASOPHILS RELATIVE PERCENT: 0.7 %
EOSINOPHILS ABSOLUTE COUNT: 0 10*9/L (ref 0.0–0.5)
EOSINOPHILS RELATIVE PERCENT: 0.1 %
HEMATOCRIT: 30.9 % — ABNORMAL LOW (ref 34.0–44.0)
HEMOGLOBIN: 9.9 g/dL — ABNORMAL LOW (ref 11.3–14.9)
LYMPHOCYTES ABSOLUTE COUNT: 1.2 10*9/L (ref 1.1–3.6)
LYMPHOCYTES RELATIVE PERCENT: 16.7 %
MEAN CORPUSCULAR HEMOGLOBIN CONC: 32.1 g/dL (ref 32.0–36.0)
MEAN CORPUSCULAR HEMOGLOBIN: 33.4 pg — ABNORMAL HIGH (ref 25.9–32.4)
MEAN CORPUSCULAR VOLUME: 104.1 fL — ABNORMAL HIGH (ref 77.6–95.7)
MEAN PLATELET VOLUME: 7.2 fL (ref 6.8–10.7)
MONOCYTES ABSOLUTE COUNT: 0.1 10*9/L — ABNORMAL LOW (ref 0.3–0.8)
MONOCYTES RELATIVE PERCENT: 0.7 %
NEUTROPHILS ABSOLUTE COUNT: 5.8 10*9/L (ref 1.8–7.8)
NEUTROPHILS RELATIVE PERCENT: 81.8 %
PLATELET COUNT: 638 10*9/L — ABNORMAL HIGH (ref 150–450)
RED BLOOD CELL COUNT: 2.97 10*12/L — ABNORMAL LOW (ref 3.95–5.13)
RED CELL DISTRIBUTION WIDTH: 20.3 % — ABNORMAL HIGH (ref 12.2–15.2)
WBC ADJUSTED: 7.1 10*9/L (ref 3.6–11.2)

## 2021-03-26 LAB — HIGH SENSITIVITY TROPONIN I - SINGLE: HIGH SENSITIVITY TROPONIN I: 7 ng/L (ref <=34)

## 2021-03-26 MED ADMIN — heparin, porcine (PF) 100 unit/mL injection 300 Units: 300 [IU] | INTRAVENOUS | @ 02:00:00 | Stop: 2021-03-25

## 2021-03-26 NOTE — Unmapped (Signed)
Patient ambulated independently with home walker.

## 2021-03-26 NOTE — Unmapped (Signed)
Hem/Onc Phone Note    Caller: ED provider    Reason for Call:   Marissa Kelly is a 65 y.o. F with recurrent head and neck SCC currently receiving treatment with cetuximab/paclitaxel/cisplatin.  She previously had an infusion reaction to carboplatin on 5/3 prompting change to cisplatin.  Today, she again had an infusion reaction, this time to cisplatin.  See infusion clinic and ED provider notes for full details.  She is now back to baseline clinically.  ED asking if any further recommendations for monitoring or evaluation based on her specific reaction and drug exposure.     Assessment/Plan:   - No additional diagnostic or therapeutic recommendations at this time for her cisplatin infusion reaction  - Disposition per ED  - Will notify outpatient team    Please page Oncology Consults at [phone] if questions about care occur.     Fellow Taking Call:  Nelia Shi  Mar 25, 2021 7:42 PM

## 2021-03-26 NOTE — Unmapped (Unsigned)
Patient called RN into her room and reported symptoms of nausea and feeling like she needed to have a bowel movement/upset stomach. RN stopped infusion. Patient then became altered and VS showed hypotension and hypoxia. Emergency meds given, APP called to bedside and RRT called to bedside. Patient was transported to the ED with the help of RRT and house supervisor. Patient belongings brought to ED room 75.

## 2021-03-27 ENCOUNTER — Other Ambulatory Visit: Payer: Self-pay | Admitting: *Deleted

## 2021-03-27 MED ORDER — OXYCODONE-ACETAMINOPHEN 5-325 MG PO TABS: 2.0000 | ORAL_TABLET | Freq: Four times a day (QID) | ORAL | 0 refills | Status: DC | PRN

## 2021-03-27 MED ORDER — FENTANYL 37.5 MCG/HR TD PT72: 37.5000 ug | MEDICATED_PATCH | TRANSDERMAL | 0 refills | Status: DC

## 2021-03-27 NOTE — Unmapped (Signed)
Comprehensive metabolic panel  Order: [id number]  Status: Final result ??    CBC w/ Differential  Order: [id number] - Part of Panel Order [id number]  Status: Final result ??    Sent to provider for review

## 2021-03-28 MED ORDER — GABAPENTIN 300 MG CAPSULE
ORAL_CAPSULE | Freq: Three times a day (TID) | ORAL | 0 refills | 45 days | Status: CP
Start: 2021-03-28 — End: ?

## 2021-03-28 NOTE — Unmapped (Signed)
Hi,     Gordon contacted the [employer] regarding the following:    - Pt stated that she hasn't heard anything about her medication     Please contact [place] at [phone].    Thanks in advance,    Keturah Shavers  [facility]   [phone]

## 2021-04-01 ENCOUNTER — Ambulatory Visit
Admit: 2021-04-01 | Payer: MEDICARE | Attending: Student in an Organized Health Care Education/Training Program | Primary: Student in an Organized Health Care Education/Training Program

## 2021-04-01 ENCOUNTER — Ambulatory Visit: Admit: 2021-04-01 | Payer: MEDICARE

## 2021-04-01 NOTE — Unmapped (Unsigned)
{    Coding tips - Do not edit this text, it will delete upon signing of note!    ?? Telephone visits [phone] for Physicians and APP???s and [phone] for Non- Physician Clinicians)- Only use minutes on the phone to determine level of service.    ?? Video visits [phone]) - Use both minutes on video and pre/post minutes to determine level of service.       :[id number]}    The patient reports they are currently: at home. I spent 8 minutes on the real-time audio and video with the patient on the date of service. I spent an additional *** minutes on pre- and post-visit activities on the date of service.     The patient was physically located in [place] or a state in which I am permitted to provide care. The patient and/or parent/guardian understood that s/he may incur co-pays and cost sharing, and agreed to the telemedicine visit. The visit was reasonable and appropriate under the circumstances given the patient's presentation at the time.    The patient and/or parent/guardian has been advised of the potential risks and limitations of this mode of treatment (including, but not limited to, the absence of in-person examination) and has agreed to be treated using telemedicine. The patient's/patient's family's questions regarding telemedicine have been answered.     If the visit was completed in an ambulatory setting, the patient and/or parent/guardian has also been advised to contact their provider???s office for worsening conditions, and seek emergency medical treatment and/or call 911 if the patient deems either necessary.    [facility]  PCP: No PCP Per Patient    Consulting providers:  Otolaryngology: Dr. Roma Schanz   Radiation Oncology: N/A    Reason for Visit: video visit, follow up on treatment     Assessment/Plan:    Marissa Kelly is a 65 y.o. female with history of hypertension, a-fib, COPD and recurrent head and neck cancer. 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 Diagnosed with biopsy-proven recurrence and started on neoadjuvant chemotherapy with Keis regimen. Goal is for symptom control and to improve surgical outcomes. C1D1 02/11/2021. Patient developed hypersensitivity reaction to carboplatin and will transitioned to cisplatin (03/04/2021). Unfortunately, she experienced similar hypersensitivity reaction to Cisplatin. She presents today for follow up visit.     # Recurrent SCC right maxillary sinus   - Primary location is right pterygopalatine fossa with V2/V3 involvement with extension into skull base. Based on MRI has unresectable, locally advanced SNSCC   - Treating with neoadjvuant therapy with Keis regimen followed by re-evaluation for surgical resection.??Treatment goals include symptom control and chance to improve surgical outcomes. However, patient understands that this is an aggressive cancer and if she does not respond, we will continue same treatment regimen but with palliative intent   - Transitioned from carboplatin to cisplatin (03/04/2021) given hypersensitivity reaction, per below. Unfortunately, patient developed similar hypersensitivity reaction to cisplatin on 5/24   - Ultimately, she has only received 2 full doses of treatment given adverse reactions and missing 2 doses on 5/10 & 5/17 due to weakness; therefore, I have low suspicion we will see much of a treatment response on repeat scans   - Post treatment scans scheduled for 1 week and will coordinate with Dr. Roma Schanz regarding options for surgery.     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 She deferred a this time   - Will CTM; if neuropathy worsen, consider DR of cisplatin to see if this improves symptoms     # Supportive care  - Cancer-related pain: managed on current regimen; fentanyl 100 mcg buccal tablet; percocet 5-325 mg q6h prn; gabapentin to 600 mg TID.  - Smoking Cessation: currently smoking about half pack daily. She has deferred assistance at this time.   - Constipation: none   - Access: [place]   - Psychosocial:  denies any need for CCSP at this time.     Follow up: 1 week with repeat imaging     Siddharth Sheth   Head and Neck Medical Oncology  [facility]   --------------------------  Interval History  - Patient was transferred to the ED from clinic on 5/24 due to hypersensitivity reaction during infusion. After receiving 30 mL of therapy, she became diaphoretic, hypotensive and unresponsive. Given Pepcid, benadryl, solu-medrol and fluid with symptomatic improvement and return to baseline mentation. Workup while in the ED was otherwise reassuring.   - Denies any persistent symptoms related to allergic reaction and has been doing well since   - She endorses generalized weakness, but states she is working on getting her strength back   - Weight and appetite stable   - Denies n/v, constipation, diarrhea, rash, urticaria    History of Present Illness:  Marissa Kelly is a 65 y.o. female with history of hypertension a-fib, COPD, who presents for evaluation of head and neck cancer. I have reviewed their records including history, imaging, pathology reports, and, when applicable, operative notes and summarized her oncologic history below:    Patient presented to the ED in September 2021 for evaluation of 6 week right-sided facial swelling and pain. Pain first started as a small oral ulcer that progressively worsened, causing difficulty with mastication and oral intake. CT maxillofacial obtained in the ED was notable for bulky and indistinct enhancing tumor which has agressively destroyed the right maxilla, infiltrates along the right buccal space, floor of right nasal cavity, into the right pterygoid palatine fossa, right orbital apex, right soft palate and palatine tonsil. Furthermore, there was evidence of intracranial extension along the right V2 at the foramen rotundum. There was also evidence of right level 1A lymph node measuring 7 mm.     Patient was referred to oncology for further evaluation. Biopsy revealed invasive SCC, well differentiating. She completed concurrent chemoradiation therapy in December 2021. Patient tolerated chemo without serve side effects or toxicity but reports some difficulty with the radiation treatment, endorsing recurrent hospitalizations for a-fib and pneumonia. Felt her symptoms improved following treatment, was eating more solid foods.    Unfortunately, she developed recurrent facial swelling about 1-2 months after completing treatment. There was concern for sinus infection, underwent ear tube placement without any resolution in pain or swelling. She endorses throat pain, mostly eating liquids and soft foods.     Repeat scans from 01/27/21 were notable for recurrent disease in the right pterygopalatine fossa, along  V2/V3 extending into the cavernous sinus and orbital fissure, as possible perineural spread.     She describes mild right-sided otalgia and facial numbness/weakness. Experienced similar symptoms prior to initial diagnosis that never entirely resolved. She reports ongoing dysphagia but attributes this to radiation therapy. Denies nausea or vomiting.     Patient is a current smoker, reports 50 year pack history. Denies heavy alcohol use.     Additional history is given by roommate on the phone .    Past Medical History:   Diagnosis Date   ??? Hypertension No past surgical history on file.    Current Outpatient Medications   Medication Sig Dispense Refill   ??? acetaminophen (TYLENOL) 500 MG tablet Take by mouth.     ??? albuterol HFA 90 mcg/actuation inhaler Inhale 2 puffs every six (6) hours as needed.      ??? ALBUTEROL INHL Inhale.     ??? amiodarone (PACERONE) 200 MG tablet Take 1 tablet (200 mg total) by mouth daily. 30 tablet 0   ??? amLODIPine (NORVASC) 5 MG tablet Take 5 mg by mouth daily.     ??? apixaban (ELIQUIS) 5 mg Tab Take 5 mg by mouth Two (2) times a day.     ??? docusate sodium (COLACE) 100 MG capsule Take 1 capsule by mouth daily.     ??? EPINEPHrine (EPIPEN) 0.3 mg/0.3 mL injection Inject 0.3 mL (0.3 mg total) into the muscle once as needed for anaphylaxis. May repeat once in 5-15 minutes if symptoms continue. 2 each 0   ??? fentaNYL 37.5 mcg/hour PT72      ??? filgrastim (NEUPOGEN) 300 mcg/0.5 mL Syrg Inject 0.5 mL (300 mcg total) under the skin daily. For 2 days each week of chemotherapy. Do not administer within 24 hours of chemotherapy. 4 mL 0   ??? folic acid/multivit-min/lutein (CENTRUM SILVER ORAL) Take 1 tablet by mouth.     ??? food supplemt, lactose-reduced (ENSURE ACTIVE HIGH PROTEIN) Liqd Take 237 mL by mouth.     ??? furosemide (LASIX) 20 MG tablet Take 20 mg by mouth.     ??? gabapentin (NEURONTIN) 300 MG capsule Take 2 capsules (600 mg total) by mouth Three (3) times a day. 270 capsule 0   ??? ibuprofen (ADVIL,MOTRIN) 800 MG tablet Take 800 mg by mouth every six (6) hours as needed for pain.     ??? lidocaine-prilocaine (EMLA) 2.5-2.5 % cream      ??? lisinopriL (PRINIVIL,ZESTRIL) 20 MG tablet Take 1 tablet (20 mg total) by mouth daily. 30 tablet 0   ??? magnesium chloride (MAG 64) 64 mg magnesium TbEC Take 64 mg by mouth daily. 2 times daily     ??? melatonin 10 mg Tab Take 10 mg by mouth.     ??? mirtazapine (REMERON) 15 MG tablet Take 15 mg by mouth nightly.     ??? ondansetron (ZOFRAN) 8 MG tablet Take 1 tablet (8 mg total) by mouth every eight (8) hours as needed for nausea (or vomiting). 30 tablet 2   ??? oxyCODONE-acetaminophen (PERCOCET) 5-325 mg per tablet Take 2 tablets by mouth every six (6) hours as needed for pain.     ??? potassium chloride (KAYCIEL) 20 mEq/15 mL solution Take 40 mEq by mouth daily. Take 30 m l (40 MEQ) po daily     ??? prochlorperazine (COMPAZINE) 10 MG tablet Take 1 tablet (10 mg total) by mouth every six (6) hours as needed (Nausea/Vomiting). 30 tablet 2   ??? VENTOLIN HFA 90 mcg/actuation  inhaler Inhale 2 puffs every six (6) hours as needed.        No current facility-administered medications for this visit.       Allergies   Allergen Reactions   ??? Naproxen Other (See Comments), Rash and Swelling   ??? Aspirin Other (See Comments)   ??? Belladonna    ??? Phenobarbital Other (See Comments)   ??? Fluoxetine Rash   ??? Paroxetine Hcl Rash       Social History     Tobacco Use   ??? Smoking status: Never Smoker   ??? Smokeless tobacco: Never Used       Social History     Social History Narrative   ??? Not on file       Family History   Problem Relation Age of Onset   ??? Skin cancer Neg Hx        Review of Systems: A 12-system review of systems was obtained including: Constitutional, Eyes, ENT, Cardiovascular, Respiratory, GI, GU, Musculoskeletal, Skin, Neurological, Psychiatric, Endocrine, Heme/Lymphatic, and Allergic/Immunologic systems. It is negative or non-contributory to the patient???s management except for as stated in patient's HPI    ECOG PS: 1    Physical Examination:  Vital Signs: There were no vitals taken for this visit.  CONSTITUTIONAL: Pleasant, tired,  woman; NAD  Head and Face:  Mild orbital edema   Hearing:  Normal hearing to spoken voice.  Oral Cavity: dry oral mucosa with cracking on lips, tumor noted to right posterior hard palate, open area ~ 1.5 cm  Lymphatics: No palpable lymphadenopathy   CV: RRR; no lower extremity edema  RESP: normal work of breathing  GI: Soft, non-tender  SKIN: No rashes or ecchymoses  NEURO: Alert and oriented,  numbness of right face consistent with V2/V3 involvement, speech garbaled, gait steady with walker  PSYCH: Normal mood and appropriate affect    LABS  Lab Results   Component Value Date    WBC 26.5 (H) 03/25/2021    HGB 9.2 (L) 03/25/2021    HCT 28.7 (L) 03/25/2021    PLT 511 (H) 03/25/2021       Lab Results   Component Value Date    NA 136 03/25/2021    K 3.2 (L) 03/25/2021    CL 107 03/25/2021    CO2 24.0 03/25/2021    BUN 9 03/25/2021    CREATININE 0.29 (L) 03/25/2021    GLU 192 (H) 03/25/2021    CALCIUM 7.7 (L) 03/25/2021    MG 1.6 03/25/2021       Lab Results   Component Value Date    BILITOT 0.2 (L) 03/25/2021    PROT 5.5 (L) 03/25/2021    ALBUMIN 2.3 (L) 03/25/2021    ALT 17 03/25/2021    AST 21 03/25/2021    ALKPHOS 151 (H) 03/25/2021       Lab Results   Component Value Date    PT 22.6 (H) 03/04/2021    INR 1.94 03/04/2021       IMAGING  MRI Arlys John (01/27/2021)   - Findings as above suggestive of tumor in the right pterygopalatine fossa, along V2/V3 extending into the cavernous sinus and orbital fissure suspicious for perineural spread /recurrent disease.    CTA Chest (03/04/2021)   - No evidence of acute pulmonary embolus.  - Scattered groundless opacities, most prominent in the medial bilateral lower lobes which concerning for aspiration and/or infectious process.    PATHOLOGY  Right palate biopsy (07/25/20)   Diagnosis  Invasive squamous cell carcinoma (well differentiated, keratinizing)    ??  Scribe's Attestation: Ginette Otto, DO obtained and performed the history, physical exam and medical decision making elements that were entered into the chart.  Signed by Jerl Santos, Scribe, on Apr 01, 2021 10:44 AM    {*** NOTE TO PROVIDER: PLEASE ADD ATTESTATION NOTING YOU AGREE WITH SCRIBE DOCUMENTATION}

## 2021-04-09 ENCOUNTER — Ambulatory Visit: Admit: 2021-04-09 | Discharge: 2021-04-10 | Payer: MEDICARE

## 2021-04-09 ENCOUNTER — Telehealth
Admit: 2021-04-09 | Discharge: 2021-04-10 | Payer: MEDICARE | Attending: Student in an Organized Health Care Education/Training Program | Primary: Student in an Organized Health Care Education/Training Program

## 2021-04-09 MED ADMIN — gadobenate dimeglumine (MULTIHANCE) 529 mg/mL (0.1mmol/0.2mL) solution 10 mL: 10 mL | INTRAVENOUS | @ 15:00:00 | Stop: 2021-04-09

## 2021-04-09 NOTE — Unmapped (Signed)
The patient reports they are currently: at home. I spent 20 minutes on the real-time audio and video with the patient on the date of service. I spent an additional 30 minutes on pre- and post-visit activities on the date of service.     The patient was physically located in [place] or a state in which I am permitted to provide care. The patient and/or parent/guardian understood that s/he may incur co-pays and cost sharing, and agreed to the telemedicine visit. The visit was reasonable and appropriate under the circumstances given the patient's presentation at the time.    The patient and/or parent/guardian has been advised of the potential risks and limitations of this mode of treatment (including, but not limited to, the absence of in-person examination) and has agreed to be treated using telemedicine. The patient's/patient's family's questions regarding telemedicine have been answered.     If the visit was completed in an ambulatory setting, the patient and/or parent/guardian has also been advised to contact their provider???s office for worsening conditions, and seek emergency medical treatment and/or call 911 if the patient deems either necessary.    [facility]  PCP: No PCP Per Patient    Consulting providers:  Otolaryngology: Dr. Roma Schanz   Radiation Oncology: N/A    Reason for Visit: video visit, follow up on treatment with repeat imaging     Assessment/Plan:    Marissa Kelly is a 65 y.o. female with history of hypertension, a-fib, COPD and recurrent head and neck cancer. 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 Diagnosed with biopsy-proven recurrence and started on neoadjuvant chemotherapy with Keis regimen. Goal is for symptom control and to improve surgical outcomes. C1D1 02/11/2021. Patient developed hypersensitivity reaction to carboplatin and will transitioned to cisplatin (03/04/2021). Unfortunately, she experienced similar hypersensitivity reaction to Cisplatin. She presents today for follow up and review repeat imaging     # Recurrent SCC right maxillary sinus   - Primary location is right pterygopalatine fossa with V2/V3 involvement with extension into skull base. 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  - Smoking Cessation: currently smoking about half pack daily. She has deferred assistance    - Constipation: none   - Access: [place]   - Psychosocial:  denies any need for CCSP at this time.     Follow up: as needed  Siddharth Sheth   Head and Neck Medical Oncology  [facility] [place]   --------------------------  Interval History  - Televisit today  - She endorses generalized weakness, stable  - Weight and appetite stable   - Denies fevers, chills, n/v, constipation, diarrhea, rash, urticaria  - Pain well controlled  - Does not want to return to [facility] for care due to poor experience at [facility] ED    History of Present Illness:  Marissa Kelly is a 65 y.o. female with history of hypertension a-fib, COPD, who presents for evaluation of head and neck cancer. I have reviewed their records including history, imaging, pathology reports, and, when applicable, operative notes and summarized her oncologic history below:    Patient presented to the ED in September 2021 for evaluation of 6 week right-sided facial swelling and pain. Pain first started as a small oral ulcer that progressively worsened, causing difficulty with mastication and oral intake. CT maxillofacial obtained in the ED was notable for bulky and indistinct enhancing tumor which has agressively destroyed the right maxilla, infiltrates along the right buccal space, floor of right nasal cavity, into the right pterygoid palatine fossa, right orbital apex, right soft palate and palatine tonsil. Furthermore, there was evidence of intracranial extension along the right V2 at the foramen rotundum. There was also evidence of right level 1A lymph node measuring 7 mm.     Patient was referred to oncology for further evaluation. Biopsy revealed invasive SCC, well differentiating. She completed concurrent chemoradiation therapy in December 2021. Patient tolerated chemo without serve side effects or toxicity but reports some difficulty with the radiation treatment, endorsing recurrent hospitalizations for a-fib and pneumonia. Felt her symptoms improved following treatment, was eating more solid foods.    Unfortunately, she developed recurrent facial swelling about 1-2 months after completing treatment. There was concern for sinus infection, underwent ear tube placement without any resolution in pain or swelling. She endorses throat pain, mostly eating liquids and soft foods.     Repeat scans from 01/27/21 were notable for recurrent disease in the right pterygopalatine fossa, along V2/V3 extending into the cavernous sinus and orbital fissure, as possible perineural spread.     She describes mild right-sided otalgia and facial numbness/weakness. Experienced similar symptoms prior to initial diagnosis that never entirely resolved. She reports ongoing dysphagia but attributes this to radiation therapy. Denies nausea or vomiting.     Patient is a current smoker, reports 50 year pack history. Denies heavy alcohol use.     Additional history is given by roommate on the phone .    Past Medical History:   Diagnosis Date   ??? Hypertension        No past surgical history on file.    Current Outpatient Medications   Medication Sig Dispense Refill   ??? acetaminophen (TYLENOL) 500 MG tablet Take by mouth.     ??? albuterol HFA 90 mcg/actuation inhaler Inhale 2 puffs every six (6) hours as needed.      ??? ALBUTEROL INHL Inhale.     ??? amiodarone (PACERONE) 200 MG tablet Take 1 tablet (200 mg total) by mouth daily. 30 tablet 0   ??? amLODIPine (NORVASC) 5 MG tablet Take 5 mg by mouth daily.     ??? apixaban (ELIQUIS) 5 mg Tab Take 5 mg by mouth Two (2) times a day.     ??? docusate sodium (COLACE) 100 MG capsule Take 1 capsule by mouth daily.     ??? EPINEPHrine (EPIPEN) 0.3  mg/0.3 mL injection Inject 0.3 mL (0.3 mg total) into the muscle once as needed for anaphylaxis. May repeat once in 5-15 minutes if symptoms continue. 2 each 0   ??? fentaNYL 37.5 mcg/hour PT72      ??? filgrastim (NEUPOGEN) 300 mcg/0.5 mL Syrg Inject 0.5 mL (300 mcg total) under the skin daily. For 2 days each week of chemotherapy. Do not administer within 24 hours of chemotherapy. 4 mL 0   ??? folic acid/multivit-min/lutein (CENTRUM SILVER ORAL) Take 1 tablet by mouth.     ??? food supplemt, lactose-reduced (ENSURE ACTIVE HIGH PROTEIN) Liqd Take 237 mL by mouth.     ??? furosemide (LASIX) 20 MG tablet Take 20 mg by mouth.     ??? gabapentin (NEURONTIN) 300 MG capsule Take 2 capsules (600 mg total) by mouth Three (3) times a day. 270 capsule 0   ??? ibuprofen (ADVIL,MOTRIN) 800 MG tablet Take 800 mg by mouth every six (6) hours as needed for pain.     ??? lidocaine-prilocaine (EMLA) 2.5-2.5 % cream      ??? lisinopriL (PRINIVIL,ZESTRIL) 20 MG tablet Take 1 tablet (20 mg total) by mouth daily. 30 tablet 0   ??? magnesium chloride (MAG 64) 64 mg magnesium TbEC Take 64 mg by mouth daily. 2 times daily     ??? melatonin 10 mg Tab Take 10 mg by mouth.     ??? mirtazapine (REMERON) 15 MG tablet Take 15 mg by mouth nightly.     ??? ondansetron (ZOFRAN) 8 MG tablet Take 1 tablet (8 mg total) by mouth every eight (8) hours as needed for nausea (or vomiting). 30 tablet 2   ??? oxyCODONE-acetaminophen (PERCOCET) 5-325 mg per tablet Take 2 tablets by mouth every six (6) hours as needed for pain.     ??? potassium chloride (KAYCIEL) 20 mEq/15 mL solution Take 40 mEq by mouth daily. Take 30 m l (40 MEQ) po daily     ??? prochlorperazine (COMPAZINE) 10 MG tablet Take 1 tablet (10 mg total) by mouth every six (6) hours as needed (Nausea/Vomiting). 30 tablet 2   ??? VENTOLIN HFA 90 mcg/actuation inhaler Inhale 2 puffs every six (6) hours as needed.        No current facility-administered medications for this visit.       Allergies   Allergen Reactions   ??? Naproxen Other (See Comments), Rash and Swelling   ??? Aspirin Other (See Comments)   ??? Belladonna    ??? Phenobarbital Other (See Comments)   ??? Fluoxetine Rash   ??? Paroxetine Hcl Rash       Social History     Tobacco Use   ??? Smoking status: Never Smoker   ??? Smokeless tobacco: Never Used       Social History     Social History Narrative   ??? Not on file       Family History   Problem Relation Age of Onset   ??? Skin cancer Neg Hx        Review of Systems: A 12-system review of systems was obtained including: Constitutional, Eyes, ENT, Cardiovascular, Respiratory, GI, GU, Musculoskeletal, Skin, Neurological, Psychiatric, Endocrine, Heme/Lymphatic, and Allergic/Immunologic systems. It is negative or non-contributory to the patient???s management except for as stated in patient's HPI    ECOG PS: 1    Limited Physical Examination:  Vital Signs: There were no vitals taken for this visit.  CONSTITUTIONAL: Chronically ill appearing woman; NAD  Head and Face:  Mild orbital edema:  dry oral mucosa with cracking on lips, tumor noted to right posterior hard palate, open area ~ 1.5 cm  Lymphatics: No obvious lymphadenopathy   RESP: normal work of breathing  SKIN: No rashes noted  NEURO: no focal deficits as compared to previous  PSYCH: Normal mood and appropriate affect    LABS  Lab Results   Component Value Date    WBC 26.5 (H) 03/25/2021    HGB 9.2 (L) 03/25/2021    HCT 28.7 (L) 03/25/2021    PLT 511 (H) 03/25/2021       Lab Results   Component Value Date    NA 136 03/25/2021    K 3.2 (L) 03/25/2021    CL 107 03/25/2021    CO2 24.0 03/25/2021    BUN 9 03/25/2021    CREATININE 0.29 (L) 03/25/2021    GLU 192 (H) 03/25/2021    CALCIUM 7.7 (L) 03/25/2021    MG 1.6 03/25/2021       Lab Results   Component Value Date    BILITOT 0.2 (L) 03/25/2021    PROT 5.5 (L) 03/25/2021    ALBUMIN 2.3 (L) 03/25/2021    ALT 17 03/25/2021    AST 21 03/25/2021    ALKPHOS 151 (H) 03/25/2021       Lab Results   Component Value Date    PT 22.6 (H) 03/04/2021    INR 1.94 03/04/2021       IMAGING  MRI [facility] (04/09/2021)   - Grossly stable abnormal enhancement involving the right cavernous sinus skull base and muscles mastication.  - Perineural tumor spread V1, V2, V3 with significant involvement of right cavernous sinus again noted.  - Prior right maxillary sinus surgery with right sinus floor and anterior wall absent. Significant reduction in soft tissue swelling and enhancement anterior to the right maxillary sinus and in the right nasolacrimal fold.    CTA Chest (04/09/2021)   -No metastatic disease within the thorax  - Bilateral areas of groundglass attenuation and consolidation as discussed likely due to bronchopneumonia or aspiration. There may also be a component of pulmonary edema.    PATHOLOGY  Right palate biopsy (07/25/20)   Diagnosis   Invasive squamous cell carcinoma (well differentiated, keratinizing)

## 2021-04-11 ENCOUNTER — Telehealth: Payer: Self-pay | Admitting: *Deleted

## 2021-04-11 NOTE — Telephone Encounter (Signed)
Incoming call from [place] that patient needs follow up with Dr Tasia Catchings to continue her systemic chemotherapy treatments per the doctors at [facility] and patient chose to continue treatment at our facility. Please arrange appointment to see Dr Tasia Catchings and call patient with appointment. Elta Guadeloupe said to call him for any questions

## 2021-04-11 NOTE — Telephone Encounter (Signed)
There is not f/u from [place] put in so we will wait till YU is back on Monday.

## 2021-04-14 ENCOUNTER — Other Ambulatory Visit: Payer: Self-pay

## 2021-04-14 ENCOUNTER — Encounter: Payer: Self-pay | Admitting: Oncology

## 2021-04-14 ENCOUNTER — Inpatient Hospital Stay (HOSPITAL_BASED_OUTPATIENT_CLINIC_OR_DEPARTMENT_OTHER): Payer: Medicare Other | Admitting: Oncology

## 2021-04-14 ENCOUNTER — Inpatient Hospital Stay: Payer: Medicare Other | Attending: Oncology

## 2021-04-14 ENCOUNTER — Inpatient Hospital Stay: Payer: Medicare Other

## 2021-04-14 VITALS — BP 140/83 | HR 72

## 2021-04-14 VITALS — BP 114/68 | HR 80 | Temp 98.3°F | Resp 18 | Wt 108.2 lb

## 2021-04-14 DIAGNOSIS — C310 Malignant neoplasm of maxillary sinus: Secondary | ICD-10-CM

## 2021-04-14 DIAGNOSIS — G893 Neoplasm related pain (acute) (chronic): Secondary | ICD-10-CM

## 2021-04-14 DIAGNOSIS — E440 Moderate protein-calorie malnutrition: Secondary | ICD-10-CM | POA: Diagnosis not present

## 2021-04-14 DIAGNOSIS — Z7901 Long term (current) use of anticoagulants: Secondary | ICD-10-CM | POA: Diagnosis not present

## 2021-04-14 DIAGNOSIS — E876 Hypokalemia: Secondary | ICD-10-CM | POA: Diagnosis not present

## 2021-04-14 DIAGNOSIS — Z8679 Personal history of other diseases of the circulatory system: Secondary | ICD-10-CM

## 2021-04-14 DIAGNOSIS — E871 Hypo-osmolality and hyponatremia: Secondary | ICD-10-CM

## 2021-04-14 DIAGNOSIS — Z5112 Encounter for antineoplastic immunotherapy: Secondary | ICD-10-CM | POA: Diagnosis present

## 2021-04-14 DIAGNOSIS — Z79899 Other long term (current) drug therapy: Secondary | ICD-10-CM | POA: Insufficient documentation

## 2021-04-14 DIAGNOSIS — I4891 Unspecified atrial fibrillation: Secondary | ICD-10-CM | POA: Insufficient documentation

## 2021-04-14 DIAGNOSIS — C760 Malignant neoplasm of head, face and neck: Secondary | ICD-10-CM

## 2021-04-14 DIAGNOSIS — R1310 Dysphagia, unspecified: Secondary | ICD-10-CM

## 2021-04-14 DIAGNOSIS — Z7189 Other specified counseling: Secondary | ICD-10-CM

## 2021-04-14 DIAGNOSIS — I10 Essential (primary) hypertension: Secondary | ICD-10-CM | POA: Diagnosis not present

## 2021-04-14 LAB — CBC WITH DIFFERENTIAL/PLATELET
Abs Immature Granulocytes: 0.12 10*3/uL — ABNORMAL HIGH (ref 0.00–0.07)
Basophils Absolute: 0.1 10*3/uL (ref 0.0–0.1)
Basophils Relative: 1 %
Eosinophils Absolute: 0.2 10*3/uL (ref 0.0–0.5)
Eosinophils Relative: 1 %
HCT: 30.7 % — ABNORMAL LOW (ref 36.0–46.0)
Hemoglobin: 10 g/dL — ABNORMAL LOW (ref 12.0–15.0)
Immature Granulocytes: 1 %
Lymphocytes Relative: 10 %
Lymphs Abs: 1.1 10*3/uL (ref 0.7–4.0)
MCH: 33 pg (ref 26.0–34.0)
MCHC: 32.6 g/dL (ref 30.0–36.0)
MCV: 101.3 fL — ABNORMAL HIGH (ref 80.0–100.0)
Monocytes Absolute: 1.5 10*3/uL — ABNORMAL HIGH (ref 0.1–1.0)
Monocytes Relative: 12 %
Neutro Abs: 9 10*3/uL — ABNORMAL HIGH (ref 1.7–7.7)
Neutrophils Relative %: 75 %
Platelets: 678 10*3/uL — ABNORMAL HIGH (ref 150–400)
RBC: 3.03 MIL/uL — ABNORMAL LOW (ref 3.87–5.11)
RDW: 19.4 % — ABNORMAL HIGH (ref 11.5–15.5)
WBC: 12 10*3/uL — ABNORMAL HIGH (ref 4.0–10.5)
nRBC: 0 % (ref 0.0–0.2)

## 2021-04-14 LAB — HEPATIC FUNCTION PANEL
ALT: 10 U/L (ref 0–44)
AST: 15 U/L (ref 15–41)
Albumin: 3.1 g/dL — ABNORMAL LOW (ref 3.5–5.0)
Alkaline Phosphatase: 115 U/L (ref 38–126)
Bilirubin, Direct: <0.1 mg/dL (ref 0.0–0.2)
Total Bilirubin: <0.1 mg/dL — ABNORMAL LOW (ref 0.3–1.2)
Total Protein: 7.1 g/dL (ref 6.5–8.1)

## 2021-04-14 LAB — BASIC METABOLIC PANEL
Anion gap: 8 (ref 5–15)
BUN: 19 mg/dL (ref 8–23)
CO2: 29 mmol/L (ref 22–32)
Calcium: 9 mg/dL (ref 8.9–10.3)
Chloride: 92 mmol/L — ABNORMAL LOW (ref 98–111)
Creatinine, Ser: 0.46 mg/dL (ref 0.44–1.00)
GFR, Estimated: >60 mL/min (ref >60)
Glucose, Bld: 106 mg/dL — ABNORMAL HIGH (ref 70–99)
Potassium: 4.5 mmol/L (ref 3.5–5.1)
Sodium: 129 mmol/L — ABNORMAL LOW (ref 135–145)

## 2021-04-14 LAB — MAGNESIUM: Magnesium: 1.8 mg/dL (ref 1.7–2.4)

## 2021-04-14 MED ORDER — SODIUM CHLORIDE 0.9 % IV SOLN
Freq: Once | INTRAVENOUS | Status: AC
Start: 2021-04-14 — End: 2021-04-14
  Filled 2021-04-14: qty 250

## 2021-04-14 MED ORDER — HEPARIN SOD (PORK) LOCK FLUSH 100 UNIT/ML IV SOLN
500.0000 [IU] | Freq: Once | INTRAVENOUS | Status: AC
  Administered 2021-04-14: 500 [IU] via INTRAVENOUS
  Filled 2021-04-14: qty 5

## 2021-04-14 MED ORDER — HEPARIN SOD (PORK) LOCK FLUSH 100 UNIT/ML IV SOLN
INTRAVENOUS | Status: AC
  Filled 2021-04-14: qty 5

## 2021-04-14 NOTE — Progress Notes (Signed)
Hematology/Oncology follow up note [facility] Telephone:(336) [phone] Fax:([phone]) [phone]   Patient Care Team: Pcp, No as PCP - General Rockey Situ, Kathlene November, MD as PCP - Cardiology (Cardiology) Earlie Server, MD as Consulting Physician (Oncology)  CHIEF COMPLAINTS/REASON FOR VISIT:  Follow-up for head and neck cancer  HISTORY OF PRESENTING ILLNESS:   Shannon Schroeder is a  65 y.o.  female with PMH listed below was seen in consultation at the request of ER Dr. [name] for evaluation of abnormal CT scan.  07/10/2020 she presented emergency room for evaluation of right-sided facial swelling and pain in the right side of her mouth for the past 6 weeks.  It started in her mouth with a small ulcer which progressively got worse.  She wears denture.  Not able to chew well.  She eats soft food. She was found to have right upper gum also with yellowish discharge noted. 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  Avoid piloting.,  Right orbital apex, also the right soft palate and palate and tonsil.  Furthermore there is evidence of intracranial extension along the right V2 at the foramen rotundum Small suspicious right level 1A lymph node measuring 7 mm.  Separate small indeterminate but suspicious enhancing soft tissue nodule of the left sublingual space.  Patient was referred to oncology for further evaluation management. Patient denies fever, chills, nausea vomiting diarrhea shortness of breath or cough.  Reports 10 out of 10 severe pain of right face and numbness.  She also has headache. Reports unintentional weight loss  She reports no contactable family members.  She is in the process of pointing her roommate [name] to be her power of attorney.  She also wants to record today's conversation for her roommate  [name]. Patient has 50-pack-year smoking history.  # seen by ENT Dr.Juengel and had a biopsy. Biopsy was sent to [facility] and the final pathology was positive for invasive squamous cell carcinoma, well-differentiated, keratinizing. 07/17/2020, brain MRI showed large right facial mass with right maxillary erosion involving the maxillary sinus, hard palate and pterygoid plates and muscles.  Perineural spreading along the multiple branches of the maxillary division of the right trigeminal nerve at the infraorbital canal, pterygopalatine fossa and foramen rotundum. 07/17/2020, PET scan showed Intense FDG uptake is associated with the large enhancing tumor which involves the right maxilla, right nasal cavity, right pterygoid palatine fossa, right orbital apex and right soft tissue palate and palatine tonsil. 2. Mild nonspecific FDG uptake is associated with the recently characterized suspicious right level 1A lymph node which measures 7 mm and has an SUV max of 2.12.  No signs of distant metastasis.  Aortic atherosclerosis.  Patient also reports a history of hypertension, dysrhythmia, history of V. tach.  Patient previously was on lisinopril, atenolol, amlodipine for which she is no longer taking at this point due to lack of medication coverage.  She also does not have any primary care provider. 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  On average she says she is takes 2 to 3 pills/day.  She also takes gabapentin 300 mg 3 times daily  # new onset of atrial fibrillation with rapid ventricular  response.  Patient was started on metoprolol.  Eliquis for anticoagulation.  # 09/06/2020- 09/11/2020 admitted due to A. fib, hypotension, multifocal pneumonia.  Patient was treated with IV antibiotics.  Cardiology saw the patient during admission and  added amiodarone.  Patient was discharged on 5 days course of oral antibiotics, continued on metoprolol and amiodarone for atrial fibrillation.  # Dysphagia, she declined PET tube placment.  #08/08/2020-10/11/2020 concurrent cisplatin and radiation. # 01/09/2021, PET scan showed improvement but persistent hypermetabolic activity associated with the right frontal sinus.Marked improvement in the posterior right nasopharynx with decrease in metabolic activity and tissue thickening.  Persistent but improved hypermetabolic thickened tissue in the anterior and medial right maxillary sinus.  Persistent hypermetabolic activity posterior at this level of pterygoid plate on the RIGHT. Potential new activity in the RIGHT skull base inferior medial along the sphenoid bone  INTERVAL HISTORY Shannon Schroeder is a 65 y.o. female who has above history reviewed by me today presents for follow up visit for management of head and neck cancer, post hospitalization follow up  Patient was last seen by me on 01/14/2021, She had recurrent maxillary sinus squamous cell carcinoma and was referred to [facility] for discussion of feasibility of residual disease resection.  Patient stated at [facility] for her oncology care after that. Extensive outside medical records review was performed by me via Care Everywhere.  01/27/21 MRI brain with and without contrast were notable for recurrent disease in the right pterygopalatine fossa, along V2/V3 extending into the cavernous sinus and orbital fissure, as possibleperineural spread.  01/29/2021 patient establish care with [facility]NT Dr. Ihor Austin..  Due to the extent of disease, patient was not considered as a surgical candidate.  Dr.Blumberg sent patient to establish care with [facility] for further discussion. Patient was recommended to proceed with neoadjuvant chemotherapy  Keis regimen followed by reevaluation of surgical resection.  Patient understands that treatment goal include symptom control  and increase chance of disease control and her surgical outcome.  Patient understands that if she does not respond, same treatment will be continued but with palliative intent.  Keis regimen : Cetuximab 400 mg/m2 load then 250 mg/m2 weekly, PACLItaxel 135 mg/m2 weekly,CARBOplatin AUC 2 weekly for 6 weeks Alpha gal was negative  02/06/2021  C1D1  Cetux/Taxol/Carbo, able to finish chemo without complications 1/61/0960, C1 D8 Cetux/Taxol/Carbo able to finish chemo without complications 02/5408 Cetux/Taxol/Carbo, she developed hypotension, nausea and flushing.  Patient was evaluated by nurse practitioner.  Was given IV fluid bolus with improvement of BP and the patient was able to finish infusion successfully.  03/04/2021, C1D22, Cetux/Taxol/Carbo Per note, patient developed hypersensitivity to carboplatin. "tolerated Cetuximab and Paclitaxel infusion without sequelae however during her Carbo infusion, she became acutely altered, momentarily unresponsive, acutely desaturated to 70s, then 80s on RA, hypotensive 81/51 with respiratory wheezing appreciated. The infusion was stopped. NS IVF were given along with pepcid & solumedrol. Patient's vital signs slowly responded but not to baseline. Pulse was thready and skin was cold and clammy. She followed some commands, slowly. A rapid response was called for the acute change in mental status and concerning vital signs. Patient was taken to ER for further evaluation and work up"  03/25/2021 C1 D29 Cetux/Taxol/Cisplatin carboplatin was switched to cisplatin Unfortunately patient developed hypersensitivity to cisplatin as well. Patient missed 5/10, 5/17 treatments due to weakness.  04/10/2019 2 repeat MRI brain with and without contrast showed evidence of disease response, however continues to have CN V involvement with extension to the  skull base, unlikely to be a surgical candidate.  Her case was noted to be discussed on the head and neck tumor board at [facility]. Patient  was recommended to switch to regimen with pembrolizumab and cetuximab given the intolerance of cis-platinum. Patient prefers to transfer her care back to me.  She presents to discuss treatment plans.  Accompanied by her friend  Neoplasm related pain, patient is on gabapentin 600 mg twice daily, Percocet 5/325 mg 2 tablets every 6 hours as needed.  She continues to have dysphagia and has to pure all solid food She previously declined PEG tube insertion.    Review of Systems  Constitutional:  Positive for fatigue. Negative for appetite change, chills, fever and unexpected weight change.  HENT:   Negative for hearing loss, mouth sores and voice change.        Facial swelling, pain   Eyes:  Negative for eye problems.  Respiratory:  Negative for chest tightness, cough and shortness of breath.   Cardiovascular:  Negative for chest pain and palpitations.  Gastrointestinal:  Negative for abdominal distention, abdominal pain, blood in stool, constipation, nausea and vomiting.       Dysphagia  Endocrine: Negative for hot flashes.  Genitourinary:  Negative for difficulty urinating and frequency.   Musculoskeletal:  Negative for arthralgias.  Skin:  Negative for itching and rash.  Neurological:  Negative for extremity weakness and headaches.  Hematological:  Negative for adenopathy.  Psychiatric/Behavioral:  Negative for confusion.    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    Anxiety    Arthritis    Bipolar disorder (HCC)    COPD (chronic obstructive pulmonary disease) (HCC)    NO INHALERS   Depression    Dysrhythmia    H/O V TACH   Head and neck cancer ([place]) 07/29/2020   Headache    H/O MIGRAINES   Hypertension    Seizures ([place])    X1 AFTER FALL    SURGICAL HISTORY: Past Surgical History:  Procedure Laterality Date   ABDOMINAL HYSTERECTOMY     partial   BREAST SURGERY      biopsy   CARDIAC CATHETERIZATION     X 2   CARPAL TUNNEL RELEASE Right    CARPECTOMY HAND Right    CATARACT EXTRACTION W/PHACO Left 03/08/2018   Procedure: CATARACT EXTRACTION PHACO AND INTRAOCULAR LENS PLACEMENT ([place]);  Surgeon: Birder Robson, MD;  Location: [facility] ORS;  Service: Ophthalmology;  Laterality: Left;  [place] 00:55 AP% 13.5 CDE 7.52 Fluid pack lot # [id number]   CATARACT EXTRACTION W/PHACO Right 04/05/2018   Procedure: CATARACT EXTRACTION PHACO AND INTRAOCULAR LENS PLACEMENT (I[facility]);  Surgeon: Birder Robson, MD;  Location: [facility] ORS;  Service: Ophthalmology;  Laterality: Right;  [place] 00:36 AP% 15.9 CDE 5.72 Fluid pack lot # [id number]   CEREBRAL ANEURYSM REPAIR     CHOLECYSTECTOMY     COLONOSCOPY WITH PROPOFOL N/A 04/26/2019   Procedure: COLONOSCOPY WITH PROPOFOL;  Surgeon: Jonathon Bellows, MD;  Location: [facility] ENDOSCOPY;  Service: Gastroenterology;  Laterality: N/A;   PORTACATH PLACEMENT Left 08/05/2020   Procedure: INSERTION PORT-A-CATH;  Surgeon: Herbert Pun, MD;  Location: [facility] ORS;  Service: General;  Laterality: Left;   TUBAL LIGATION      SOCIAL HISTORY: Social History   Socioeconomic History   Marital status: Divorced    Spouse name: Not on file   Number of children: Not on file  Years of education: 62   Highest education level: Bachelor's degree (e.g., BA, AB, BS)  Occupational History   Occupation: farmer  Tobacco Use   Smoking status: Every Day    Packs/day: 0.25    Years: 50.00    Pack years: 12.50    Types: Cigarettes   Smokeless tobacco: Never  Vaping Use   Vaping Use: Never used  Substance and Sexual Activity   Alcohol use: No   Drug use: No   Sexual activity: Not Currently  Other Topics Concern   Not on file  Social History Narrative   Pt [facility] at [facility].    Social Determinants of Health   Financial Resource Strain: Not on file  Food Insecurity: Not on file  Transportation Needs: Not on file  Physical Activity: Not on file   Stress: Not on file  Social Connections: Not on file  Intimate Partner Violence: Not on file    FAMILY HISTORY: Family History  Problem Relation Age of Onset   Hypertension Mother    Heart failure Mother    Hypertension Brother    Stroke Brother    Hypertension Son    Emphysema Maternal Aunt    Hypertension Paternal Aunt    Hypertension Paternal Uncle    Hypertension Maternal Grandmother    Hypertension Paternal Grandmother     ALLERGIES:  is allergic to aspirin, belladonna alkaloids, fluoxetine hcl, naproxen, phenobarbital, and prozac [fluoxetine hcl].  MEDICATIONS:  Current Outpatient Medications  Medication Sig Dispense Refill   albuterol (VENTOLIN HFA) 108 (90 Base) MCG/ACT inhaler Inhale 2 puffs into the lungs every 6 (six) hours as needed for wheezing or shortness of breath. 8 g 0   amiodarone (PACERONE) 200 MG tablet Take 1 tablet (200 mg total) by mouth daily. 90 tablet 1   apixaban (ELIQUIS) 5 MG TABS tablet Take 1 tablet (5 mg total) by mouth 2 (two) times daily. 180 tablet 3   docusate sodium (COLACE) 100 MG capsule Take 1 capsule (100 mg total) by mouth daily. 30 capsule 1   feeding supplement, ENSURE ENLIVE, (ENSURE ENLIVE) LIQD Take 237 mLs by mouth 3 (three) times daily between meals. 237 mL 12   fentaNYL 37.5 MCG/HR PT72 Place 37.5 mcg onto the skin every 3 (three) days. 10 patch 0   gabapentin (NEURONTIN) 300 MG capsule Take 1 capsule (300 mg total) by mouth 3 (three) times daily. 90 capsule 0   lidocaine-prilocaine (EMLA) cream Apply 1 application topically as needed [facility]). Apply to affected area once 30 g 2   Multiple Vitamins-Minerals (CENTRUM SILVER PO) Take 1 tablet by mouth daily. Gummie     oxyCODONE-acetaminophen (PERCOCET) 5-325 MG tablet Take 2 tablets by mouth every 6 (six) hours as needed for severe pain. 60 tablet 0   prochlorperazine (COMPAZINE) 10 MG tablet Take 1 tablet (10 mg total) by mouth every 6 (six) hours as needed (Nausea or vomiting). 30  tablet 1   furosemide (LASIX) 20 MG tablet Take 1 tablet (20 mg total) by mouth daily as needed (for lower extremity swelling). (Patient not taking: Reported on 04/14/2021) 90 tablet 1   magnesium chloride (SLOW-MAG) 64 MG TBEC SR tablet Take 1 tablet (64 mg total) by mouth 2 (two) times daily. (Patient not taking: No sig reported) 60 tablet 1   Melatonin 10 MG TABS Take 10 mg by mouth at bedtime as needed (sleep). (Patient not taking: No sig reported)     mirtazapine (REMERON) 15 MG tablet Take 1 tablet (15  mg total) by mouth at bedtime. (Patient not taking: Reported on 04/14/2021) 30 tablet 0   ondansetron (ZOFRAN-ODT) 8 MG disintegrating tablet Take 1 tablet (8 mg total) by mouth every 8 (eight) hours as needed for nausea or vomiting. (Patient not taking: Reported on 04/14/2021) 90 tablet 0   polyethylene glycol powder (GLYCOLAX/MIRALAX) 17 GM/SCOOP powder Take 17 g by mouth daily as needed for mild constipation or moderate constipation. (Patient not taking: No sig reported) 255 g 0   potassium chloride 20 MEQ/15ML (10%) SOLN Take 30 mLs (40 mEq total) by mouth daily. [place] fund to pay for medication (Patient not taking: Reported on 04/14/2021) 900 mL 1   No current facility-administered medications for this visit.   Facility-Administered Medications Ordered in Other Visits  Medication Dose Route Frequency Provider Last Rate Last Admin   sodium chloride flush (NS) 0.9 % injection 10 mL  10 mL Intravenous PRN Earlie Server, MD   10 mL at 10/10/20 1040   sodium chloride flush (NS) 0.9 % injection 10 mL  10 mL Intravenous PRN Earlie Server, MD   10 mL at 11/13/20 0836   sodium chloride flush (NS) 0.9 % injection 10 mL  10 mL Intravenous PRN Earlie Server, MD   10 mL at 12/30/20 0838   sodium chloride flush (NS) 0.9 % injection 10 mL  10 mL Intravenous PRN Earlie Server, MD   10 mL at 01/14/21 0921     PHYSICAL EXAMINATION: ECOG PERFORMANCE STATUS: 1 - Symptomatic but completely ambulatory Vitals:   04/14/21 1341  BP:  114/68  Pulse: 80  Resp: 18  Temp: 98.3 F (36.8 C)  SpO2: 96%   Filed Weights   04/14/21 1341  Weight: 108 lb 3.2 oz (49.1 kg)    Physical Exam Constitutional:      General: She is not in acute distress.    Comments: Thin built.  HENT:     Head: Normocephalic and atraumatic.     Mouth/Throat:     Comments: No thrush.  Patient wears dentures She has right palate deficiency, with chronic yellowish discharge covering her maxillary sinus surface.  Eyes:     General: No scleral icterus.    Comments: Right eyelid ptosis  Cardiovascular:     Rate and Rhythm: Normal rate and regular rhythm.     Heart sounds: Normal heart sounds.  Pulmonary:     Effort: Pulmonary effort is normal. No respiratory distress.     Breath sounds: No wheezing.  Abdominal:     General: Bowel sounds are normal. There is no distension.     Palpations: Abdomen is soft.  Musculoskeletal:        General: No deformity. Normal range of motion.     Cervical back: Normal range of motion and neck supple.  Skin:    General: Skin is warm and dry.     Findings: No erythema or rash.  Neurological:     Mental Status: She is alert and oriented to person, place, and time. Mental status is at baseline.     Cranial Nerves: No cranial nerve deficit.     Coordination: Coordination normal.  Psychiatric:        Mood and Affect: Mood normal.    LABORATORY DATA:  I have reviewed the data as listed Lab Results  Component Value Date   WBC 12.0 (H) 04/14/2021   HGB 10.0 (L) 04/14/2021   HCT 30.7 (L) 04/14/2021   MCV 101.3 (H) 04/14/2021   PLT 678 (H)  04/14/2021   Recent Labs    07/10/20 1311 07/12/20 1204 08/08/20 0821 10/29/20 1042 11/06/20 0803 12/30/20 0830 01/14/21 0911 01/28/21 0820 04/14/21 1317  NA 136 137   < > 132*   < > 133* 134* 138 129*  K 2.9* 4.0   < > 3.4*   < > 3.2* 3.2* 2.8* 4.5  CL 97* 98   < > 96*   < > 94* 94* 100 92*  CO2 28 27   < > 28   < > 26 27 27 29   GLUCOSE 100* 114*   < > 94    < > 92 93 87 106*  BUN 13 8   < > 7*   < > 14 10 7* 19  CREATININE 0.43* 0.42*   < > 0.59   < > 0.36* 0.54 0.48 0.46  CALCIUM 8.9 9.0   < > 8.8*   < > 8.8* 9.1 8.6* 9.0  GFRNONAA >60 >60   < > >60   < > >60 >60 >60 >60  GFRAA >60 >60  --   --   --   --   --   --   --   PROT 7.0  --    < > 6.5  --  7.1  --   --  7.1  ALBUMIN 3.7  --    < > 3.6  --  3.4*  --   --  3.1*  AST 20  --    < > 19  --  15  --   --  15  ALT 16  --    < > 9  --  10  --   --  10  ALKPHOS 106  --    < > 73  --  86  --   --  115  BILITOT 0.5  --    < > 0.6  --  0.6  --   --  <0.1*  BILIDIR  --   --   --   --   --   --   --   --  <0.1  IBILI  --   --   --   --   --   --   --   --  NOT CALCULATED   < > = values in this interval not displayed.    Iron/TIBC/Ferritin/ %Sat    Component Value Date/Time   IRON 46 09/09/2020 0938   IRON 37 05/03/2018 1826   TIBC 210 (L) 09/09/2020 0938   FERRITIN 136 09/09/2020 0938   FERRITIN 52 05/03/2018 1826   IRONPCTSAT 22 09/09/2020 0938      RADIOGRAPHIC STUDIES: I have personally reviewed the radiological images as listed and agreed with the findings in the report. No results found.     ASSESSMENT & PLAN:  1. 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[place])   2. Head and neck cancer ([place])   3. History of atrial fibrillation   4. Hypomagnesemia   5. Hyponatremia   6. Neoplasm related pain   7. Dysphagia, unspecified type   8. Moderate protein-calorie malnutrition (HCC)    #maxillary sinus squamous cell carcinoma, s/p concurrent chemotherapy and radiation-recurrence Unresectable per ENT. Discussed about systemic chemotherapy plus immunotherapy options.  She agrees with the plan. Plan pembrolizumab plus cetuximab.  #Malnutrition, dysphagia Discussed with her about PEG tube placement.  Rationale and potential risk factors were discussed with patient. She is reluctant but eventually agrees with the plan.  Refer to IR for  procedure.   Right facial pain /swelling neoplasm related  pain Continue oxycodone 5 mg every 6 hours as needed.  Increase fentanyl patch to 37.5 MCG.  72 hours. Continue garbapentine 600 mg twice daily  Continue acyclovir 400 mg twice daily for HSV prophylaxis. Recommend patient to utilize Magic mouthwash as needed.  #Hyponatremia, either due to SIADH secondary to malignancy versus due to decreased oral intake. Arrange patient to receive IV hydration session-monitor normal saline over 1 hour  # Hypomagnesia, magnesium is stable.  Continue Slow-Mag 1 tablet daily.  Stable magnesium level.  #Atrial fibrillation.  on amiodarone and metoprolol.Eliquis  All questions were answered. The patient knows to call the clinic with any problems questions or concerns.   Return of visit:  Follow-up plan TBD  Earlie Server, MD, PhD Hematology Oncology [place] [facility] Pager- [phone] 04/14/2021

## 2021-04-14 NOTE — Progress Notes (Signed)
Patient reports numbness to bottom of feet and feeling extreme fatigue at rest.

## 2021-04-14 NOTE — Progress Notes (Signed)
DISCONTINUE ON PATHWAY REGIMEN - Head and Neck     A cycle is every 7 days:     Cisplatin   **Always confirm dose/schedule in your pharmacy ordering system**  REASON: Disease Progression PRIOR TREATMENT: [id number]: Cisplatin 40 mg/m2 q7 Days with Concurrent Radiation TREATMENT RESPONSE: Partial Response (PR)  START ON PATHWAY REGIMEN - Head and Neck     A cycle is every 21 days:     Pembrolizumab   **Always confirm dose/schedule in your pharmacy ordering system**  Patient Characteristics: Other Sites, Local Recurrence, Not a Candidate for Radiation Therapy, Prior Platinum-Based Chemoradiation within 6 Months, Candidate for Checkpoint Inhibitor Disease Classification: Other Sites AJCC 8 Stage Grouping: IVB Therapeutic Status: Local Recurrence Is patient a candidate for radiation therapy<= No Prior Platinum Status: Prior Platinum-Based Chemoradiation within 6 Months Checkpoint Inhibitor Candidacy: Candidate for Checkpoint Inhibitor Intent of Therapy: Non-Curative / Palliative Intent, Discussed with Patient

## 2021-04-14 NOTE — Patient Instructions (Signed)

## 2021-04-15 ENCOUNTER — Telehealth: Payer: Self-pay

## 2021-04-15 ENCOUNTER — Ambulatory Visit: Payer: Medicare Other

## 2021-04-15 ENCOUNTER — Telehealth: Payer: Self-pay | Admitting: Cardiovascular Disease

## 2021-04-15 ENCOUNTER — Other Ambulatory Visit: Payer: Medicare Other

## 2021-04-15 ENCOUNTER — Other Ambulatory Visit: Payer: Self-pay

## 2021-04-15 DIAGNOSIS — Z431 Encounter for attention to gastrostomy: Secondary | ICD-10-CM

## 2021-04-15 DIAGNOSIS — C760 Malignant neoplasm of head, face and neck: Secondary | ICD-10-CM

## 2021-04-15 DIAGNOSIS — Z01812 Encounter for preprocedural laboratory examination: Secondary | ICD-10-CM

## 2021-04-15 NOTE — Telephone Encounter (Signed)
   [facility] Pre-operative Risk Assessment    Patient Name: Shannon Schroeder  DOB: [birth year]  MRN: [id number]   HEARTCARE STAFF: - Please ensure there is not already an duplicate clearance open for this procedure. - Under Visit Info/Reason for Call, type in Other and utilize the format Clearance MM/DD/YY or Clearance TBD. Do not use dashes or single digits. - If request is for dental extraction, please clarify the # of teeth to be extracted. - If the patient is currently at the dentist's office, call Pre-Op APP to address. If the patient is not currently in the dentist office, please route to the Pre-Op pool  Request for surgical clearance:  What type of surgery is being performed? PEG tube placment  When is this surgery scheduled? TBD  What type of clearance is required (medical clearance vs. Pharmacy clearance to hold med vs. Both)? pharm  Are there any medications that need to be held prior to surgery and how long? Eliquis for 48 hr prior  Practice name and name of physician performing surgery? [place] [facility]  What is the office phone number? [phone]   7.   What is the office fax number? [phone]  8.   Anesthesia type (None, local, MAC, general) ? Not noted   Marykay Lex 04/15/2021, 2:39 PM  _________________________________________________________________   (provider comments below)

## 2021-04-15 NOTE — Telephone Encounter (Signed)
Patient with diagnosis of atrial fibrillation on Eliquis for anticoagulation.    Procedure: PEG tube placement Date of procedure: TBD   CHA2DS2-VASc Score = 3  This indicates a 3.2% annual risk of stroke. The patient's score is based upon: CHF History: No HTN History: Yes Diabetes History: No Stroke History: No Vascular Disease History: No Age Score: 1 Gender Score: 1   Note: chart indicates prior TIA, but hospitalization with that dx was ultimately determined to be cerebral aneurysm  CrCl 94.1 Platelet count 511  Per office protocol, patient can hold Eliquis for 2 days prior to procedure.    Patient will not need bridging with Lovenox (enoxaparin) around procedure.

## 2021-04-15 NOTE — Telephone Encounter (Signed)
-----   Message from Vanice Sarah, [place] sent at 04/15/2021  8:31 AM EDT ----- Regarding: tx [place], is there room on the schedule for pt to get tx this week?  Lu, do you think pt will be able to receive tx this week on ins end?   ----- Message ----- From: Earlie Server, MD Sent: 04/14/2021  11:11 PM EDT To: Evelina Dun, RN, Vanice Sarah, CMA  I have placed new chemo orders,. Please arrange her to do Keytruda and cetuximab- both new here. She has received cetuximab at [facility].  If she is able to get a spot this week, then proceed without need to see me again- let me know the date so I can change chemo order dates.   if treatment is next week, please arrange lab md keytruda cetuximab.   For PEG, please arrange her to get abdomen CT wo contrast. Reason is head and neck cancer, need PEG tube.

## 2021-04-15 NOTE — Telephone Encounter (Signed)
Per Forestine Chute (IR nurse), Pt will need CT abdomen prior to scheduling PEG tube placement. Pt will also need to hold Eliquis for 48 hours prior to procedure.   Anticoagulation clearance request faxed to Dr. Donivan Scull office. Will fax PET tube placement order to specials scheduling once we receive clearance and CT is scheduled.

## 2021-04-15 NOTE — Telephone Encounter (Signed)
[facility], I have now entered the order for CT.  Could you please get that scheduled also:

## 2021-04-15 NOTE — Telephone Encounter (Signed)
    Shannon Schroeder DOB:  [birth year]  MRN:  [id number]   Primary Cardiologist: Ida Rogue, MD  Chart reviewed as part of pre-operative protocol coverage. Given past medical history and time since last visit, based on ACC/AHA guidelines, Shannon Schroeder would be at acceptable risk for the planned procedure without further cardiovascular testing.   Patient with diagnosis of atrial fibrillation on Eliquis for anticoagulation.     Procedure: PEG tube placement Date of procedure: TBD   CHA2DS2-VASc Score = 3  This indicates a 3.2% annual risk of stroke. The patient's score is based upon: CHF History: No HTN History: Yes Diabetes History: No Stroke History: No Vascular Disease History: No Age Score: 1 Gender Score: 1    Note: chart indicates prior TIA, but hospitalization with that dx was ultimately determined to be cerebral aneurysm   CrCl 94.1 Platelet count 511   Per office protocol, patient can hold Eliquis for 2 days prior to procedure.     Patient will not need bridging with Lovenox (enoxaparin) around procedure.   The patient was advised that if she develops new symptoms prior to surgery to contact our office to arrange for a follow-up visit, and she verbalized understanding.  I will route this recommendation to the requesting party via Epic fax function and remove from pre-op pool.  Please call with questions.  Kathyrn Drown, NP 04/15/2021, 4:25 PM

## 2021-04-17 NOTE — Telephone Encounter (Signed)
Patient scheduled for treatment on 6/22 and CT abdomen on 6/30. Anticoagulation clearance form in Epic.  PEG Tube placement request faxed to specialized scheduling.

## 2021-04-23 ENCOUNTER — Inpatient Hospital Stay (HOSPITAL_BASED_OUTPATIENT_CLINIC_OR_DEPARTMENT_OTHER): Payer: Medicare Other | Admitting: Oncology

## 2021-04-23 ENCOUNTER — Inpatient Hospital Stay: Payer: Medicare Other

## 2021-04-23 ENCOUNTER — Encounter: Payer: Self-pay | Admitting: Oncology

## 2021-04-23 ENCOUNTER — Other Ambulatory Visit: Payer: Self-pay

## 2021-04-23 VITALS — BP 113/75 | HR 92 | Temp 98.2°F | Resp 18 | Wt 112.7 lb

## 2021-04-23 DIAGNOSIS — R1310 Dysphagia, unspecified: Secondary | ICD-10-CM

## 2021-04-23 DIAGNOSIS — G893 Neoplasm related pain (acute) (chronic): Secondary | ICD-10-CM

## 2021-04-23 DIAGNOSIS — C760 Malignant neoplasm of head, face and neck: Secondary | ICD-10-CM

## 2021-04-23 DIAGNOSIS — E440 Moderate protein-calorie malnutrition: Secondary | ICD-10-CM | POA: Diagnosis not present

## 2021-04-23 DIAGNOSIS — C310 Malignant neoplasm of maxillary sinus: Secondary | ICD-10-CM

## 2021-04-23 DIAGNOSIS — Z5112 Encounter for antineoplastic immunotherapy: Secondary | ICD-10-CM

## 2021-04-23 DIAGNOSIS — Z8679 Personal history of other diseases of the circulatory system: Secondary | ICD-10-CM

## 2021-04-23 DIAGNOSIS — Z5111 Encounter for antineoplastic chemotherapy: Secondary | ICD-10-CM

## 2021-04-23 DIAGNOSIS — E871 Hypo-osmolality and hyponatremia: Secondary | ICD-10-CM

## 2021-04-23 LAB — COMPREHENSIVE METABOLIC PANEL
ALT: 11 U/L (ref 0–44)
AST: 17 U/L (ref 15–41)
Albumin: 3.1 g/dL — ABNORMAL LOW (ref 3.5–5.0)
Alkaline Phosphatase: 103 U/L (ref 38–126)
Anion gap: 11 (ref 5–15)
BUN: 10 mg/dL (ref 8–23)
CO2: 28 mmol/L (ref 22–32)
Calcium: 9.1 mg/dL (ref 8.9–10.3)
Chloride: 93 mmol/L — ABNORMAL LOW (ref 98–111)
Creatinine, Ser: 0.54 mg/dL (ref 0.44–1.00)
GFR, Estimated: >60 mL/min (ref >60)
Glucose, Bld: 100 mg/dL — ABNORMAL HIGH (ref 70–99)
Potassium: 3.8 mmol/L (ref 3.5–5.1)
Sodium: 132 mmol/L — ABNORMAL LOW (ref 135–145)
Total Bilirubin: 0.3 mg/dL (ref 0.3–1.2)
Total Protein: 7.1 g/dL (ref 6.5–8.1)

## 2021-04-23 LAB — CBC WITH DIFFERENTIAL/PLATELET
Abs Immature Granulocytes: 0.05 10*3/uL (ref 0.00–0.07)
Basophils Absolute: 0.1 10*3/uL (ref 0.0–0.1)
Basophils Relative: 1 %
Eosinophils Absolute: 0.1 10*3/uL (ref 0.0–0.5)
Eosinophils Relative: 1 %
HCT: 31.2 % — ABNORMAL LOW (ref 36.0–46.0)
Hemoglobin: 10.2 g/dL — ABNORMAL LOW (ref 12.0–15.0)
Immature Granulocytes: 0 %
Lymphocytes Relative: 6 %
Lymphs Abs: 0.7 10*3/uL (ref 0.7–4.0)
MCH: 33.4 pg (ref 26.0–34.0)
MCHC: 32.7 g/dL (ref 30.0–36.0)
MCV: 102.3 fL — ABNORMAL HIGH (ref 80.0–100.0)
Monocytes Absolute: 1.1 10*3/uL — ABNORMAL HIGH (ref 0.1–1.0)
Monocytes Relative: 9 %
Neutro Abs: 9.5 10*3/uL — ABNORMAL HIGH (ref 1.7–7.7)
Neutrophils Relative %: 83 %
Platelets: 441 10*3/uL — ABNORMAL HIGH (ref 150–400)
RBC: 3.05 MIL/uL — ABNORMAL LOW (ref 3.87–5.11)
RDW: 18 % — ABNORMAL HIGH (ref 11.5–15.5)
WBC: 11.5 10*3/uL — ABNORMAL HIGH (ref 4.0–10.5)
nRBC: 0 % (ref 0.0–0.2)

## 2021-04-23 LAB — TSH: TSH: 9.08 u[IU]/mL — ABNORMAL HIGH (ref 0.350–4.500)

## 2021-04-23 MED ORDER — FAMOTIDINE 20 MG IN NS 100 ML IVPB
20.0000 mg | Freq: Once | INTRAVENOUS | Status: AC
  Administered 2021-04-23: 20 mg via INTRAVENOUS
  Filled 2021-04-23: qty 20

## 2021-04-23 MED ORDER — SODIUM CHLORIDE 0.9 % IV SOLN
Freq: Once | INTRAVENOUS | Status: AC
  Filled 2021-04-23: qty 250

## 2021-04-23 MED ORDER — SODIUM CHLORIDE 0.9% FLUSH
10.0000 mL | INTRAVENOUS | Status: DC | PRN
Start: 2021-04-23 — End: 2021-04-23
  Filled 2021-04-23: qty 10

## 2021-04-23 MED ORDER — HEPARIN SOD (PORK) LOCK FLUSH 100 UNIT/ML IV SOLN
INTRAVENOUS | Status: AC
  Filled 2021-04-23: qty 5

## 2021-04-23 MED ORDER — SODIUM CHLORIDE 0.9 % IV SOLN
200.0000 mg | Freq: Once | INTRAVENOUS | Status: DC
  Filled 2021-04-23: qty 8

## 2021-04-23 MED ORDER — MONTELUKAST SODIUM 10 MG PO TABS
10.0000 mg | ORAL_TABLET | Freq: Once | ORAL | Status: AC
  Administered 2021-04-23: 10 mg via ORAL
  Filled 2021-04-23: qty 1

## 2021-04-23 MED ORDER — CETUXIMAB CHEMO IV INJECTION 200 MG/100ML
400.0000 mg/m2 | Freq: Once | INTRAVENOUS | Status: DC
  Administered 2021-04-23: 600 mg via INTRAVENOUS
  Filled 2021-04-23: qty 300

## 2021-04-23 MED ORDER — DIPHENHYDRAMINE HCL 50 MG/ML IJ SOLN
50.0000 mg | Freq: Once | INTRAMUSCULAR | Status: AC
  Administered 2021-04-23: 50 mg via INTRAVENOUS
  Filled 2021-04-23: qty 1

## 2021-04-23 MED ORDER — HEPARIN SOD (PORK) LOCK FLUSH 100 UNIT/ML IV SOLN
500.0000 [IU] | Freq: Once | INTRAVENOUS | Status: AC | PRN
  Administered 2021-04-23: 500 [IU]
  Filled 2021-04-23: qty 5

## 2021-04-23 MED ORDER — CETUXIMAB CHEMO IV INJECTION 200 MG/100ML
400.0000 mg/m2 | Freq: Once | INTRAVENOUS | Status: DC
  Filled 2021-04-23: qty 300

## 2021-04-23 MED ORDER — SODIUM CHLORIDE 0.9 % IV SOLN
200.0000 mg | Freq: Once | INTRAVENOUS | Status: DC
  Administered 2021-04-23: 200 mg via INTRAVENOUS
  Filled 2021-04-23: qty 8

## 2021-04-23 MED ORDER — SODIUM CHLORIDE 0.9 % IV SOLN
10.0000 mg | Freq: Once | INTRAVENOUS | Status: AC
  Administered 2021-04-23: 10 mg via INTRAVENOUS
  Filled 2021-04-23: qty 10

## 2021-04-23 NOTE — Telephone Encounter (Signed)
Patient in clinic today for treatment. She reports that she has been eating better. Per Dr. Tasia Catchings, we will hold off on PEG tube placement for now and will schedule in the future if needed. [place] notified.

## 2021-04-23 NOTE — Patient Instructions (Signed)
[facility]  Discharge Instructions: Thank you for choosing [facility] to provide your oncology and hematology care.  If you have a lab appointment with the [facility], please go directly to the [facility] and check in at the registration area.  Wear comfortable clothing and clothing appropriate for easy access to any Portacath or PICC line.   We strive to give you quality time with your provider. You may need to reschedule your appointment if you arrive late (15 or more minutes).  Arriving late affects you and other patients whose appointments are after yours.  Also, if you miss three or more appointments without notifying the office, you may be dismissed from the clinic at the provider's discretion.      For prescription refill requests, have your pharmacy contact our office and allow 72 hours for refills to be completed.    Today you received the following chemotherapy and/or immunotherapy agents - pembrolizumab, cetuximab      To help prevent nausea and vomiting after your treatment, we encourage you to take your nausea medication as directed.  BELOW ARE SYMPTOMS THAT SHOULD BE REPORTED IMMEDIATELY: *FEVER GREATER THAN 100.4 F (38 C) OR HIGHER *CHILLS OR SWEATING *NAUSEA AND VOMITING THAT IS NOT CONTROLLED WITH YOUR NAUSEA MEDICATION *UNUSUAL SHORTNESS OF BREATH *UNUSUAL BRUISING OR BLEEDING *URINARY PROBLEMS (pain or burning when urinating, or frequent urination) *BOWEL PROBLEMS (unusual diarrhea, constipation, pain near the anus) TENDERNESS IN MOUTH AND THROAT WITH OR WITHOUT PRESENCE OF ULCERS (sore throat, sores in mouth, or a toothache) UNUSUAL RASH, SWELLING OR PAIN  UNUSUAL VAGINAL DISCHARGE OR ITCHING   Items with * indicate a potential emergency and should be followed up as soon as possible or go to the Emergency Department if any problems should occur.  Please show the CHEMOTHERAPY ALERT CARD or IMMUNOTHERAPY ALERT  CARD at check-in to the Emergency Department and triage nurse.  Should you have questions after your visit or need to cancel or reschedule your appointment, please contact [facility]  [phone] and follow the prompts.  Office hours are 8:00 a.m. to 4:30 p.m. Monday - Friday. Please note that voicemails left after 4:00 p.m. may not be returned until the following business day.  We are closed weekends and major holidays. You have access to a nurse at all times for urgent questions. Please call the main number to the clinic [phone] and follow the prompts.  For any non-urgent questions, you may also contact your provider using MyChart. We now offer e-Visits for anyone 53 and older to request care online for non-urgent symptoms. For details visit [email or web address].   Also download the MyChart app! Go to the app store, search "MyChart", open the app, select [facility], and log in with your MyChart username and password.  Due to Covid, a mask is required upon entering the hospital/clinic. If you do not have a mask, one will be given to you upon arrival. For doctor visits, patients may have 1 support person aged 52 or older with them. For treatment visits, patients cannot have anyone with them due to current Covid guidelines and our immunocompromised population.   Pembrolizumab injection What is this medication? PEMBROLIZUMAB (pem broe liz ue mab) is a monoclonal antibody. It is used totreat certain types of cancer. This medicine may be used for other purposes; ask your health care provider orpharmacist if you have questions. COMMON BRAND NAME(S): Keytruda What should I tell my care team  before I take this medication? They need to know if you have any of these conditions: autoimmune diseases like Crohn's disease, ulcerative colitis, or lupus have had or planning to have an allogeneic stem cell transplant (uses someone else's stem cells) history of organ  transplant history of chest radiation nervous system problems like myasthenia gravis or Guillain-Barre syndrome an unusual or allergic reaction to pembrolizumab, other medicines, foods, dyes, or preservatives pregnant or trying to get pregnant breast-feeding How should I use this medication? This medicine is for infusion into a vein. It is given by a health careprofessional in a hospital or clinic setting. A special MedGuide will be given to you before each treatment. Be sure to readthis information carefully each time. Talk to your pediatrician regarding the use of this medicine in children. While this drug may be prescribed for children as young as 6 months for selectedconditions, precautions do apply. Overdosage: If you think you have taken too much of this medicine contact apoison control center or emergency room at once. NOTE: This medicine is only for you. Do not share this medicine with others. What if I miss a dose? It is important not to miss your dose. Call your doctor or health careprofessional if you are unable to keep an appointment. What may interact with this medication? Interactions have not been studied. This list may not describe all possible interactions. Give your health care provider a list of all the medicines, herbs, non-prescription drugs, or dietary supplements you use. Also tell them if you smoke, drink alcohol, or use illegaldrugs. Some items may interact with your medicine. What should I watch for while using this medication? Your condition will be monitored carefully while you are receiving thismedicine. You may need blood work done while you are taking this medicine. Do not become pregnant while taking this medicine or for 4 months after stopping it. Women should inform their doctor if they wish to become pregnant or think they might be pregnant. There is a potential for serious side effects to an unborn child. Talk to your health care professional or pharmacist for  more information. Do not breast-feed an infant while taking this medicine orfor 4 months after the last dose. What side effects may I notice from receiving this medication? Side effects that you should report to your doctor or health care professionalas soon as possible: allergic reactions like skin rash, itching or hives, swelling of the face, lips, or tongue bloody or black, tarry breathing problems changes in vision chest pain chills confusion constipation cough diarrhea dizziness or feeling faint or lightheaded fast or irregular heartbeat fever flushing joint pain low blood counts - this medicine may decrease the number of white blood cells, red blood cells and platelets. You may be at increased risk for infections and bleeding. muscle pain muscle weakness pain, tingling, numbness in the hands or feet persistent headache redness, blistering, peeling or loosening of the skin, including inside the mouth signs and symptoms of high blood sugar such as dizziness; dry mouth; dry skin; fruity breath; nausea; stomach pain; increased hunger or thirst; increased urination signs and symptoms of kidney injury like trouble passing urine or change in the amount of urine signs and symptoms of liver injury like dark urine, light-colored stools, loss of appetite, nausea, right upper belly pain, yellowing of the eyes or skin sweating swollen lymph nodes weight loss Side effects that usually do not require medical attention (report to yourdoctor or health care professional if they continue or are bothersome): decreased  appetite hair loss tiredness This list may not describe all possible side effects. Call your doctor for medical advice about side effects. You may report side effects to FDA at1-800-FDA-1088. Where should I keep my medication? This drug is given in a hospital or clinic and will not be stored at home. NOTE: This sheet is a summary. It may not cover all possible information. If you  have questions about this medicine, talk to your doctor, pharmacist, orhealth care provider.  2022 Elsevier/Gold Standard (2019-09-20 21:44:53)  Cetuximab injection What is this medication? CETUXIMAB (se TUX i mab) is a monoclonal antibody. It is used to treatcolorectal cancer and head and neck cancer. This medicine may be used for other purposes; ask your health care provider orpharmacist if you have questions. COMMON BRAND NAME(S): Erbitux What should I tell my care team before I take this medication? They need to know if you have any of these conditions: heart disease history of irregular heartbeat history of low levels of calcium, magnesium, or potassium in the blood history of tick bites lung or breathing disease, like asthma red meat allergy an unusual or allergic reaction to cetuximab, other medicines, foods, dyes, or preservatives pregnant or trying to get pregnant breast-feeding How should I use this medication? This drug is given as an infusion into a vein. It is administered in a hospitalor clinic by a specially trained health care professional. Talk to your pediatrician regarding the use of this medicine in children.Special care may be needed. Overdosage: If you think you have taken too much of this medicine contact apoison control center or emergency room at once. NOTE: This medicine is only for you. Do not share this medicine with others. What if I miss a dose? It is important not to miss your dose. Call your doctor or health careprofessional if you are unable to keep an appointment. What may interact with this medication? Interactions are not expected. This list may not describe all possible interactions. Give your health care provider a list of all the medicines, herbs, non-prescription drugs, or dietary supplements you use. Also tell them if you smoke, drink alcohol, or use illegaldrugs. Some items may interact with your medicine. What should I watch for while using  this medication? Visit your doctor or health care professional for regular checks on your progress. This drug may make you feel generally unwell. This is not uncommon, as chemotherapy can affect healthy cells as well as cancer cells. Report any side effects. Continue your course of treatment even though you feel ill unlessyour doctor tells you to stop. This medicine can make you more sensitive to the sun. Keep out of the sun while taking this medicine and for 2 months after the last dose. If you cannot avoid being in the sun, wear protective clothing and use sunscreen. Do not use sunlamps or tanning beds/booths. You may need blood work done while you are taking this medicine. In some cases, you may be given additional medicines to help with side effects.Follow all directions for their use. Call your doctor or health care professional for advice if you get a fever, chills or sore throat, or other symptoms of a cold or flu. Do not treat yourself. This drug decreases your body's ability to fight infections. Try toavoid being around people who are sick. Avoid taking products that contain aspirin, acetaminophen, ibuprofen, naproxen, or ketoprofen unless instructed by your doctor. These medicines may hide afever. Do not become pregnant while taking this medicine. Women should inform their doctor  if they wish to become pregnant or think they might be pregnant. There is a potential for serious side effects to an unborn child. Use adequate birth control methods. Avoid pregnancy for at least 2 months after your last dose. Talk to your health care professional or pharmacist for more information. Do not breast-feed an infant while taking this medicine or during the 2 monthsafter your last dose. What side effects may I notice from receiving this medication? Side effects that you should report to your doctor or health care professionalas soon as possible: allergic reactions like skin rash, itching or hives, swelling of  the face, lips, or tongue breathing problems changes in vision fast, irregular heartbeat feeling faint or lightheaded, falls fever, chills mouth sores redness, blistering, peeling or loosening of the skin, including inside the mouth trouble passing urine or change in the amount of urine unusually weak or tired Side effects that usually do not require medical attention (report to yourdoctor or health care professional if they continue or are bothersome): changes in skin like acne, cracks, skin dryness constipation diarrhea headache nail changes nausea, vomiting stomach upset weight loss This list may not describe all possible side effects. Call your doctor for medical advice about side effects. You may report side effects to FDA at1-800-FDA-1088. Where should I keep my medication? This drug is given in a hospital or clinic and will not be stored at home. NOTE: This sheet is a summary. It may not cover all possible information. If you have questions about this medicine, talk to your doctor, pharmacist, orhealth care provider.  2022 Elsevier/Gold Standard (2019-09-20 22:56:04)

## 2021-04-23 NOTE — Progress Notes (Addendum)
Nutrition Follow-up:    Patient with head and neck cancer.  Completed treatment on 12/10. Noted recurrence and was being treated at [facility].  Patient planning to resume treatment with Dr Tasia Catchings.  Patient to start pembrolizumab and cetuximab. Noted discussion about PEG placement  Met with patient during infusion.  Patient reports that PEG tube placement is on hold right now as has gained weight and roommate is pureeing all foods for her.  Had eggs and bacon this am. Chicken and peas last night for dinner. Drinking 4 ensure plus shakes per day (1400 calories, 52 g protein)    Medications: reviewed  Labs: reviewed  Anthropometrics:   Weight is 112 lb 11.2 oz today  108 lb on 6/13 111 lb on 4/7 111 lb on 3/15 105 lb on 2/2 111 lb on 1/24   NUTRITION DIAGNOSIS: Inadequate oral intake improving   INTERVENTION:  PEG tube is on hold at this time. Reviewed ways to increase calories and protein in puree foods. Discussed option of ordering puree foods already made but patient not interested at this time. Recommend drink at least 4 ensure plus shakes per day, increase to 5-6 if unable to eat solid foods.  Patient denies needing and shakes today. Friend is buying her shakes and shipping them to her house.  Patient requested gas voucher from [employer].  RD able to speak with Barnabas Lister and will leave at front desk.  Patient informed.      MONITORING, EVALUATION, GOAL: weight trends, intake   NEXT VISIT: Wednesday, June 29th during infuison  Ritika Hellickson B. Zenia Resides, [place], [place] Registered Dietitian [phone] (mobile)

## 2021-04-23 NOTE — Progress Notes (Signed)
Hematology/Oncology follow up note [facility] [phone] [phone]   Patient Care Team: Pcp, No as PCP - General Rockey Situ, Kathlene November, MD as PCP - Cardiology (Cardiology) Earlie Server, MD as Consulting Physician (Oncology)  CHIEF COMPLAINTS/REASON FOR VISIT:  Follow-up for head and neck cancer  HISTORY OF PRESENTING ILLNESS:   Shannon Schroeder is a  65 y.o.  female with PMH listed below was seen in consultation at the request of ER Dr. [name] for evaluation of abnormal CT scan.  07/10/2020 she presented emergency room for evaluation of right-sided facial swelling and pain in the right side of her mouth for the past 6 weeks.  It started in her mouth with a small ulcer which progressively got worse.  She wears denture.  Not able to chew well.  She eats soft food. She was found to have right upper gum also with yellowish discharge noted. 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  Avoid piloting.,  Right orbital apex, also the right soft palate and palate and tonsil.  Furthermore there is evidence of intracranial extension along the right V2 at the foramen rotundum Small suspicious right level 1A lymph node measuring 7 mm.  Separate small indeterminate but suspicious enhancing soft tissue nodule of the left sublingual space.  Patient was referred to oncology for further evaluation management. Patient denies fever, chills, nausea vomiting diarrhea shortness of breath or cough.  Reports 10 out of 10 severe pain of right face and numbness.  She also has headache. Reports unintentional weight loss  She reports no contactable family members.  She is in the process of pointing her roommate [name] to be her power of attorney.  She also wants to record today's conversation for her roommate  [name]. Patient has 50-pack-year smoking history.  # seen by ENT Dr.Juengel and had a biopsy. Biopsy was sent to [facility] and the final pathology was positive for invasive squamous cell carcinoma, well-differentiated, keratinizing. 07/17/2020, brain MRI showed large right facial mass with right maxillary erosion involving the maxillary sinus, hard palate and pterygoid plates and muscles.  Perineural spreading along the multiple branches of the maxillary division of the right trigeminal nerve at the infraorbital canal, pterygopalatine fossa and foramen rotundum. 07/17/2020, PET scan showed Intense FDG uptake is associated with the large enhancing tumor which involves the right maxilla, right nasal cavity, right pterygoid palatine fossa, right orbital apex and right soft tissue palate and palatine tonsil. 2. Mild nonspecific FDG uptake is associated with the recently characterized suspicious right level 1A lymph node which measures 7 mm and has an SUV max of 2.12.  No signs of distant metastasis.  Aortic atherosclerosis.  Patient also reports a history of hypertension, dysrhythmia, history of V. tach.  Patient previously was on lisinopril, atenolol, amlodipine for which she is no longer taking at this point due to lack of medication coverage.  She also does not have any primary care provider. 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  On average she says she is takes 2 to 3 pills/day.  She also takes gabapentin 300 mg 3 times daily  # new onset of atrial fibrillation with rapid ventricular  response.  Patient was started on metoprolol.  Eliquis for anticoagulation.  # 09/06/2020- 09/11/2020 admitted due to A. fib, hypotension, multifocal pneumonia.  Patient was treated with IV antibiotics.  Cardiology saw the patient during admission and  added amiodarone.  Patient was discharged on 5 days course of oral antibiotics, continued on metoprolol and amiodarone for atrial fibrillation.  # Dysphagia, she declined PET tube placment.  #08/08/2020-10/11/2020 concurrent cisplatin and radiation. # 01/09/2021, PET scan showed improvement but persistent hypermetabolic activity associated with the right frontal sinus.Marked improvement in the posterior right nasopharynx with decrease in metabolic activity and tissue thickening.  Persistent but improved hypermetabolic thickened tissue in the anterior and medial right maxillary sinus.  Persistent hypermetabolic activity posterior at this level of pterygoid plate on the RIGHT. Potential new activity in the RIGHT skull base inferior medial along the sphenoid bone  # 01/14/2021, She had recurrent maxillary sinus squamous cell carcinoma and was referred to [facility] ENT for discussion of feasibility of residual disease resection.  Patient stated at [facility] for her oncology care after that. Extensive outside medical records review was performed by me via Care Everywhere.   01/27/21 MRI brain with and without contrast were notable for recurrent disease in the right pterygopalatine fossa, along V2/V3 extending into the cavernous sinus and orbital fissure, as possibleperineural spread.  01/29/2021 patient establish care with [facility] Dr. Ihor Austin..  Due to the extent of disease, patient was not considered as a surgical candidate.  Dr.Blumberg sent patient to establish care with [facility] for further discussion. Patient was recommended to proceed with neoadjuvant chemotherapy  Keis regimen followed by reevaluation of surgical resection.  Patient understands that treatment goal include symptom control and increase chance of disease control and her surgical outcome.  Patient understands that if she does not respond, same treatment will be continued but with palliative intent.   Keis regimen : Cetuximab 400 mg/m2 load then 250  mg/m2 weekly, PACLItaxel 135 mg/m2 weekly,CARBOplatin AUC 2 weekly for 6 weeks Alpha gal was negative   02/06/2021  C1D1  Cetux/Taxol/Carbo, able to finish chemo without complications 8/92/1194, C1 D8 Cetux/Taxol/Carbo able to finish chemo without complications 11/7406 Cetux/Taxol/Carbo, she developed hypotension, nausea and flushing.  Patient was evaluated by nurse practitioner.  Was given IV fluid bolus with improvement of BP and the patient was able to finish infusion successfully.   03/04/2021, C1D22, Cetux/Taxol/Carbo Per note, patient developed hypersensitivity to carboplatin. "tolerated Cetuximab and Paclitaxel infusion without sequelae however during her Carbo infusion, she became acutely altered, momentarily unresponsive, acutely desaturated to 70s, then 80s on RA, hypotensive 81/51 with respiratory wheezing appreciated. The infusion was stopped. NS IVF were given along with pepcid & solumedrol. Patient's vital signs slowly responded but not to baseline. Pulse was thready and skin was cold and clammy. She followed some commands, slowly. A rapid response was called for the acute change in mental status and concerning vital signs. Patient was taken to ER for further evaluation and work up"   03/25/2021 C1 D29 Cetux/Taxol/Cisplatin carboplatin was switched to cisplatin Unfortunately patient developed hypersensitivity to cisplatin as well. Patient missed 5/10, 5/17 treatments due to weakness.   04/10/2019 2 repeat MRI brain with and without contrast showed evidence of disease response, however continues to have CN V involvement with extension to the skull base, unlikely to be a surgical candidate.  Her case was noted to be discussed on the head and neck tumor board at [facility]. Patient was recommended to switch to regimen with pembrolizumab and  cetuximab given the intolerance of cis-platinum  INTERVAL HISTORY Shannon Schroeder is a 65 y.o. female who has above history reviewed by me today presents for  follow up visit for management of head and neck cancer,  Patient tolerates pured food, reports eating better.  She also takes nutrition supplements. Presents for evaluation prior to chemotherapy.  No new complaints Her pain is controlled with current regimen.     Review of Systems  Constitutional:  Positive for fatigue. Negative for appetite change, chills, fever and unexpected weight change.  HENT:   Negative for hearing loss, mouth sores and voice change.        Facial swelling, pain   Eyes:  Negative for eye problems.  Respiratory:  Negative for chest tightness, cough and shortness of breath.   Cardiovascular:  Negative for chest pain and palpitations.  Gastrointestinal:  Negative for abdominal distention, abdominal pain, blood in stool, constipation, nausea and vomiting.       Dysphagia  Endocrine: Negative for hot flashes.  Genitourinary:  Negative for difficulty urinating and frequency.   Musculoskeletal:  Negative for arthralgias.  Skin:  Negative for itching and rash.  Neurological:  Negative for extremity weakness and headaches.  Hematological:  Negative for adenopathy.  Psychiatric/Behavioral:  Negative for confusion.    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    Anxiety    Arthritis    Bipolar disorder (HCC)    COPD (chronic obstructive pulmonary disease) (HCC)    NO INHALERS   Depression    Dysrhythmia    H/O V TACH   Head and neck cancer ([place]) 07/29/2020   Headache    H/O MIGRAINES   Hypertension    Seizures ([place])    X1 AFTER FALL    SURGICAL HISTORY: Past Surgical History:  Procedure Laterality Date   ABDOMINAL HYSTERECTOMY     partial   BREAST SURGERY     biopsy   CARDIAC CATHETERIZATION     X 2   CARPAL TUNNEL RELEASE Right    CARPECTOMY HAND Right    CATARACT EXTRACTION W/PHACO Left 03/08/2018   Procedure: CATARACT EXTRACTION PHACO AND  INTRAOCULAR LENS PLACEMENT ([place]);  Surgeon: Birder Robson, MD;  Location: [facility] ORS;  Service: Ophthalmology;  Laterality: Left;  [place] 00:55 AP% 13.5 CDE 7.52 Fluid pack lot # [id number]   CATARACT EXTRACTION W/PHACO Right 04/05/2018   Procedure: CATARACT EXTRACTION PHACO AND INTRAOCULAR LENS PLACEMENT ([facility]);  Surgeon: Birder Robson, MD;  Location: [facility] ORS;  Service: Ophthalmology;  Laterality: Right;  [place] 00:36 AP% 15.9 CDE 5.72 Fluid pack lot # [id number]   CEREBRAL ANEURYSM REPAIR     CHOLECYSTECTOMY     COLONOSCOPY WITH PROPOFOL N/A 04/26/2019   Procedure: COLONOSCOPY WITH PROPOFOL;  Surgeon: Jonathon Bellows, MD;  Location: [facility] ENDOSCOPY;  Service: Gastroenterology;  Laterality: N/A;   PORTACATH PLACEMENT Left 08/05/2020   Procedure: INSERTION PORT-A-CATH;  Surgeon: Herbert Pun, MD;  Location: [facility] ORS;  Service: General;  Laterality: Left;   TUBAL LIGATION      SOCIAL HISTORY: Social History   Socioeconomic History   Marital status: Divorced    Spouse name: Not on file   Number of children: Not on file   Years of education: 16   Highest education level: Bachelor's degree (e.g., BA, AB, BS)  Occupational History   Occupation: farmer  Tobacco Use   Smoking status: Every Day  Packs/day: 0.25    Years: 50.00    Pack years: 12.50    Types: Cigarettes   Smokeless tobacco: Never  Vaping Use   Vaping Use: Never used  Substance and Sexual Activity   Alcohol use: No   Drug use: No   Sexual activity: Not Currently  Other Topics Concern   Not on file  Social History Narrative   Pt lives at [facility].    Social Determinants of Health   Financial Resource Strain: Not on file  Food Insecurity: Not on file  Transportation Needs: Not on file  Physical Activity: Not on file  Stress: Not on file  Social Connections: Not on file  Intimate Partner Violence: Not on file    FAMILY HISTORY: Family History  Problem Relation Age of Onset   Hypertension Mother     Heart failure Mother    Hypertension Brother    Stroke Brother    Hypertension Son    Emphysema Maternal Aunt    Hypertension Paternal Aunt    Hypertension Paternal Uncle    Hypertension Maternal Grandmother    Hypertension Paternal Grandmother     ALLERGIES:  is allergic to aspirin, belladonna alkaloids, fluoxetine hcl, naproxen, phenobarbital, and prozac [fluoxetine hcl].  MEDICATIONS:  Current Outpatient Medications  Medication Sig Dispense Refill   albuterol (VENTOLIN HFA) 108 (90 Base) MCG/ACT inhaler Inhale 2 puffs into the lungs every 6 (six) hours as needed for wheezing or shortness of breath. 8 g 0   amiodarone (PACERONE) 200 MG tablet Take 1 tablet (200 mg total) by mouth daily. 90 tablet 1   apixaban (ELIQUIS) 5 MG TABS tablet Take 1 tablet (5 mg total) by mouth 2 (two) times daily. 180 tablet 3   docusate sodium (COLACE) 100 MG capsule Take 1 capsule (100 mg total) by mouth daily. 30 capsule 1   feeding supplement, ENSURE ENLIVE, (ENSURE ENLIVE) LIQD Take 237 mLs by mouth 3 (three) times daily between meals. 237 mL 12   fentaNYL 37.5 MCG/HR PT72 Place 37.5 mcg onto the skin every 3 (three) days. 10 patch 0   gabapentin (NEURONTIN) 300 MG capsule Take 1 capsule (300 mg total) by mouth 3 (three) times daily. 90 capsule 0   magnesium chloride (SLOW-MAG) 64 MG TBEC SR tablet Take 1 tablet (64 mg total) by mouth 2 (two) times daily. 60 tablet 1   Melatonin 10 MG TABS Take 10 mg by mouth at bedtime as needed (sleep).     ondansetron (ZOFRAN-ODT) 8 MG disintegrating tablet Take 1 tablet (8 mg total) by mouth every 8 (eight) hours as needed for nausea or vomiting. 90 tablet 0   oxyCODONE-acetaminophen (PERCOCET) 5-325 MG tablet Take 2 tablets by mouth every 6 (six) hours as needed for severe pain. 60 tablet 0   potassium chloride 20 MEQ/15ML (10%) SOLN Take 30 mLs (40 mEq total) by mouth daily. [employer] to pay for medication 900 mL 1   furosemide (LASIX) 20 MG tablet Take 1 tablet  (20 mg total) by mouth daily as needed (for lower extremity swelling). (Patient not taking: No sig reported) 90 tablet 1   mirtazapine (REMERON) 15 MG tablet Take 1 tablet (15 mg total) by mouth at bedtime. (Patient not taking: No sig reported) 30 tablet 0   Multiple Vitamins-Minerals (CENTRUM SILVER PO) Take 1 tablet by mouth daily. Gummie (Patient not taking: Reported on 04/23/2021)     polyethylene glycol powder (GLYCOLAX/MIRALAX) 17 GM/SCOOP powder Take 17 g by mouth daily as needed  for mild constipation or moderate constipation. (Patient not taking: No sig reported) 255 g 0   No current facility-administered medications for this visit.   Facility-Administered Medications Ordered in Other Visits  Medication Dose Route Frequency Provider Last Rate Last Admin   cetuximab (ERBITUX) chemo infusion 600 mg  400 mg/m2 (Treatment Plan Recorded) Intravenous Once Earlie Server, MD       pembrolizumab [facility]) 200 mg in sodium chloride 0.9 % 50 mL chemo infusion  200 mg Intravenous Once Earlie Server, MD       sodium chloride flush (NS) 0.9 % injection 10 mL  10 mL Intravenous PRN Earlie Server, MD   10 mL at 10/10/20 1040   sodium chloride flush (NS) 0.9 % injection 10 mL  10 mL Intravenous PRN Earlie Server, MD   10 mL at 11/13/20 0836   sodium chloride flush (NS) 0.9 % injection 10 mL  10 mL Intravenous PRN Earlie Server, MD   10 mL at 12/30/20 0838   sodium chloride flush (NS) 0.9 % injection 10 mL  10 mL Intravenous PRN Earlie Server, MD   10 mL at 01/14/21 3846   sodium chloride flush (NS) 0.9 % injection 10 mL  10 mL Intracatheter PRN Earlie Server, MD         PHYSICAL EXAMINATION: ECOG PERFORMANCE STATUS: 1 - Symptomatic but completely ambulatory Vitals:   04/23/21 0843  BP: 113/75  Pulse: 92  Resp: 18  Temp: 98.2 F (36.8 C)   Filed Weights   04/23/21 0843  Weight: 112 lb 11.2 oz (51.1 kg)    Physical Exam Constitutional:      General: She is not in acute distress.    Comments: Thin built.  HENT:     Head:  Normocephalic and atraumatic.     Mouth/Throat:     Comments: No thrush.  Patient wears dentures She has right palate deficiency, with chronic yellowish discharge covering her maxillary sinus surface.  Eyes:     General: No scleral icterus.    Comments: Right eyelid ptosis  Cardiovascular:     Rate and Rhythm: Normal rate and regular rhythm.     Heart sounds: Normal heart sounds.  Pulmonary:     Effort: Pulmonary effort is normal. No respiratory distress.     Breath sounds: No wheezing.  Abdominal:     General: Bowel sounds are normal. There is no distension.     Palpations: Abdomen is soft.  Musculoskeletal:        General: No deformity. Normal range of motion.     Cervical back: Normal range of motion and neck supple.  Skin:    General: Skin is warm and dry.     Findings: No erythema or rash.  Neurological:     Mental Status: She is alert and oriented to person, place, and time. Mental status is at baseline.     Cranial Nerves: No cranial nerve deficit.     Coordination: Coordination normal.  Psychiatric:        Mood and Affect: Mood normal.    LABORATORY DATA:  I have reviewed the data as listed Lab Results  Component Value Date   WBC 11.5 (H) 04/23/2021   HGB 10.2 (L) 04/23/2021   HCT 31.2 (L) 04/23/2021   MCV 102.3 (H) 04/23/2021   PLT 441 (H) 04/23/2021   Recent Labs    07/10/20 1311 07/12/20 1204 08/08/20 0821 12/30/20 0830 01/14/21 0911 01/28/21 0820 04/14/21 1317 04/23/21 0810  NA 136 137   < >  133*   < > 138 129* 132*  K 2.9* 4.0   < > 3.2*   < > 2.8* 4.5 3.8  CL 97* 98   < > 94*   < > 100 92* 93*  CO2 28 27   < > 26   < > 27 29 28   GLUCOSE 100* 114*   < > 92   < > 87 106* 100*  BUN 13 8   < > 14   < > 7* 19 10  CREATININE 0.43* 0.42*   < > 0.36*   < > 0.48 0.46 0.54  CALCIUM 8.9 9.0   < > 8.8*   < > 8.6* 9.0 9.1  GFRNONAA >60 >60   < > >60   < > >60 >60 >60  GFRAA >60 >60  --   --   --   --   --   --   PROT 7.0  --    < > 7.1  --   --  7.1 7.1   ALBUMIN 3.7  --    < > 3.4*  --   --  3.1* 3.1*  AST 20  --    < > 15  --   --  15 17  ALT 16  --    < > 10  --   --  10 11  ALKPHOS 106  --    < > 86  --   --  115 103  BILITOT 0.5  --    < > 0.6  --   --  <0.1* 0.3  BILIDIR  --   --   --   --   --   --  <0.1  --   IBILI  --   --   --   --   --   --  NOT CALCULATED  --    < > = values in this interval not displayed.    Iron/TIBC/Ferritin/ %Sat    Component Value Date/Time   IRON 46 09/09/2020 0938   IRON 37 05/03/2018 1826   TIBC 210 (L) 09/09/2020 0938   FERRITIN 136 09/09/2020 0938   FERRITIN 52 05/03/2018 1826   IRONPCTSAT 22 09/09/2020 0938      RADIOGRAPHIC STUDIES: I have personally reviewed the radiological images as listed and agreed with the findings in the report. No results found.     ASSESSMENT & PLAN:  1. 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[place])   2. Dysphagia, unspecified type   3. Moderate protein-calorie malnutrition ([place])   4. History of atrial fibrillation   5. Hyponatremia   6. Neoplasm related pain   7. Encounter for antineoplastic chemotherapy   8. Encounter for antineoplastic immunotherapy    #maxillary sinus squamous cell carcinoma, s/p concurrent chemotherapy and radiation-recurrence Unresectable per ENT. Labs are reviewed and discussed with patient. Proceed with cycle pembrolizumab plus cetuximab.  #Malnutrition, dysphagia Discussed with her about PEG tube placement.  Rationale and potential risk factors were discussed with patient. She is reluctant but eventually agrees with the plan.  Per IR, will obtian CT abdomen prior to PEG placement.  She appears to eat better, will re-evaluate the need of PEG tube placement in 2 weeks.  Continue nutrition supplementation.  #Right facial pain /swelling neoplasm related pain Continue oxycodone 5 mg every 6 hours as needed.  Increase fentanyl patch to 37.5 MCG.  72 hours. Continue gabapentine 600 mg twice daily  Continue acyclovir 400 mg twice daily for HSV  prophylaxis. Continue  Magic mouthwash as needed.  #Hyponatremia, either due to SIADH secondary to malignancy versus due to decreased oral intake. Sodium is improved.   # Hypomagnesia, magnesium is stable.  Continue Slow-Mag 1 tablet daily.    #Atrial fibrillation on amiodarone and metoprolol.Eliquis  All questions were answered. The patient knows to call the clinic with any problems questions or concerns.   Return of visit:   1 week for lab md cetuximab  Earlie Server, MD, PhD Hematology Oncology [facility] Pager- [phone] 04/23/2021

## 2021-04-24 ENCOUNTER — Other Ambulatory Visit: Payer: Self-pay | Admitting: *Deleted

## 2021-04-24 LAB — T4: T4, Total: 6.3 ug/dL (ref 4.5–12.0)

## 2021-04-24 MED ORDER — OXYCODONE-ACETAMINOPHEN 5-325 MG PO TABS: 2.0000 | ORAL_TABLET | Freq: Four times a day (QID) | ORAL | 0 refills | Status: DC | PRN

## 2021-04-24 MED ORDER — FENTANYL 37.5 MCG/HR TD PT72: 37.5000 ug | MEDICATED_PATCH | TRANSDERMAL | 0 refills | Status: DC

## 2021-04-30 ENCOUNTER — Inpatient Hospital Stay: Payer: Medicare Other

## 2021-04-30 ENCOUNTER — Telehealth: Payer: Self-pay

## 2021-04-30 ENCOUNTER — Other Ambulatory Visit: Payer: Self-pay

## 2021-04-30 ENCOUNTER — Inpatient Hospital Stay (HOSPITAL_BASED_OUTPATIENT_CLINIC_OR_DEPARTMENT_OTHER): Payer: Medicare Other | Admitting: Oncology

## 2021-04-30 ENCOUNTER — Encounter: Payer: Self-pay | Admitting: Oncology

## 2021-04-30 VITALS — BP 127/67 | HR 71 | Temp 96.0°F | Resp 20

## 2021-04-30 VITALS — BP 101/64 | HR 73 | Temp 96.0°F | Resp 17 | Wt 111.0 lb

## 2021-04-30 DIAGNOSIS — E440 Moderate protein-calorie malnutrition: Secondary | ICD-10-CM | POA: Diagnosis not present

## 2021-04-30 DIAGNOSIS — E871 Hypo-osmolality and hyponatremia: Secondary | ICD-10-CM

## 2021-04-30 DIAGNOSIS — G893 Neoplasm related pain (acute) (chronic): Secondary | ICD-10-CM

## 2021-04-30 DIAGNOSIS — C310 Malignant neoplasm of maxillary sinus: Secondary | ICD-10-CM

## 2021-04-30 DIAGNOSIS — R1310 Dysphagia, unspecified: Secondary | ICD-10-CM

## 2021-04-30 DIAGNOSIS — Z5112 Encounter for antineoplastic immunotherapy: Secondary | ICD-10-CM

## 2021-04-30 DIAGNOSIS — Z8679 Personal history of other diseases of the circulatory system: Secondary | ICD-10-CM

## 2021-04-30 DIAGNOSIS — Z95828 Presence of other vascular implants and grafts: Secondary | ICD-10-CM

## 2021-04-30 DIAGNOSIS — E876 Hypokalemia: Secondary | ICD-10-CM

## 2021-04-30 DIAGNOSIS — C760 Malignant neoplasm of head, face and neck: Secondary | ICD-10-CM

## 2021-04-30 LAB — MAGNESIUM: Magnesium: 1.7 mg/dL (ref 1.7–2.4)

## 2021-04-30 LAB — CBC WITH DIFFERENTIAL/PLATELET
Abs Immature Granulocytes: 0.05 10*3/uL (ref 0.00–0.07)
Basophils Absolute: 0.1 10*3/uL (ref 0.0–0.1)
Basophils Relative: 1 %
Eosinophils Absolute: 0.4 10*3/uL (ref 0.0–0.5)
Eosinophils Relative: 5 %
HCT: 31.9 % — ABNORMAL LOW (ref 36.0–46.0)
Hemoglobin: 10.2 g/dL — ABNORMAL LOW (ref 12.0–15.0)
Immature Granulocytes: 1 %
Lymphocytes Relative: 7 %
Lymphs Abs: 0.6 10*3/uL — ABNORMAL LOW (ref 0.7–4.0)
MCH: 33.1 pg (ref 26.0–34.0)
MCHC: 32 g/dL (ref 30.0–36.0)
MCV: 103.6 fL — ABNORMAL HIGH (ref 80.0–100.0)
Monocytes Absolute: 0.9 10*3/uL (ref 0.1–1.0)
Monocytes Relative: 10 %
Neutro Abs: 7 10*3/uL (ref 1.7–7.7)
Neutrophils Relative %: 76 %
Platelets: 568 10*3/uL — ABNORMAL HIGH (ref 150–400)
RBC: 3.08 MIL/uL — ABNORMAL LOW (ref 3.87–5.11)
RDW: 17 % — ABNORMAL HIGH (ref 11.5–15.5)
WBC: 9.1 10*3/uL (ref 4.0–10.5)
nRBC: 0 % (ref 0.0–0.2)

## 2021-04-30 LAB — COMPREHENSIVE METABOLIC PANEL
ALT: 16 U/L (ref 0–44)
AST: 19 U/L (ref 15–41)
Albumin: 3 g/dL — ABNORMAL LOW (ref 3.5–5.0)
Alkaline Phosphatase: 108 U/L (ref 38–126)
Anion gap: 11 (ref 5–15)
BUN: 10 mg/dL (ref 8–23)
CO2: 24 mmol/L (ref 22–32)
Calcium: 8.5 mg/dL — ABNORMAL LOW (ref 8.9–10.3)
Chloride: 94 mmol/L — ABNORMAL LOW (ref 98–111)
Creatinine, Ser: 0.43 mg/dL — ABNORMAL LOW (ref 0.44–1.00)
GFR, Estimated: >60 mL/min (ref >60)
Glucose, Bld: 108 mg/dL — ABNORMAL HIGH (ref 70–99)
Potassium: 3.3 mmol/L — ABNORMAL LOW (ref 3.5–5.1)
Sodium: 129 mmol/L — ABNORMAL LOW (ref 135–145)
Total Bilirubin: 0.4 mg/dL (ref 0.3–1.2)
Total Protein: 7 g/dL (ref 6.5–8.1)

## 2021-04-30 LAB — TSH: TSH: 6.716 u[IU]/mL — ABNORMAL HIGH (ref 0.350–4.500)

## 2021-04-30 MED ORDER — FAMOTIDINE 20 MG IN NS 100 ML IVPB
20.0000 mg | Freq: Once | INTRAVENOUS | Status: AC
  Administered 2021-04-30: 20 mg via INTRAVENOUS
  Filled 2021-04-30: qty 20

## 2021-04-30 MED ORDER — POTASSIUM CHLORIDE IN NACL 20-0.9 MEQ/L-% IV SOLN
Freq: Once | INTRAVENOUS | Status: AC
Start: 2021-04-30 — End: 2021-04-30
  Filled 2021-04-30: qty 1000

## 2021-04-30 MED ORDER — HEPARIN SOD (PORK) LOCK FLUSH 100 UNIT/ML IV SOLN
500.0000 [IU] | Freq: Once | INTRAVENOUS | Status: DC | PRN
  Filled 2021-04-30: qty 5

## 2021-04-30 MED ORDER — POTASSIUM CHLORIDE 20 MEQ/15ML (10%) PO SOLN: 40.0000 meq | Freq: Every day | ORAL | 1 refills | Status: DC

## 2021-04-30 MED ORDER — CETUXIMAB CHEMO IV INJECTION 200 MG/100ML
250.0000 mg/m2 | Freq: Once | INTRAVENOUS | Status: AC
  Administered 2021-04-30: 400 mg via INTRAVENOUS
  Filled 2021-04-30: qty 200

## 2021-04-30 MED ORDER — HEPARIN SOD (PORK) LOCK FLUSH 100 UNIT/ML IV SOLN
INTRAVENOUS | Status: AC
  Filled 2021-04-30: qty 5

## 2021-04-30 MED ORDER — MONTELUKAST SODIUM 10 MG PO TABS
10.0000 mg | ORAL_TABLET | Freq: Once | ORAL | Status: AC
Start: 2021-04-30 — End: 2021-04-30
  Administered 2021-04-30: 10 mg via ORAL
  Filled 2021-04-30: qty 1

## 2021-04-30 MED ORDER — SODIUM CHLORIDE 0.9 % IV SOLN
Freq: Once | INTRAVENOUS | Status: AC
  Filled 2021-04-30: qty 250

## 2021-04-30 MED ORDER — DIPHENHYDRAMINE HCL 50 MG/ML IJ SOLN
50.0000 mg | Freq: Once | INTRAMUSCULAR | Status: AC
  Administered 2021-04-30: 50 mg via INTRAVENOUS
  Filled 2021-04-30: qty 1

## 2021-04-30 MED ORDER — SODIUM CHLORIDE 0.9 % IV SOLN
10.0000 mg | Freq: Once | INTRAVENOUS | Status: AC
  Administered 2021-04-30: 10 mg via INTRAVENOUS
  Filled 2021-04-30: qty 10

## 2021-04-30 MED ORDER — SODIUM CHLORIDE 0.9% FLUSH
10.0000 mL | Freq: Once | INTRAVENOUS | Status: AC
Start: 2021-04-30 — End: 2021-04-30
  Administered 2021-04-30: 10 mL via INTRAVENOUS
  Filled 2021-04-30: qty 10

## 2021-04-30 MED ORDER — HEPARIN SOD (PORK) LOCK FLUSH 100 UNIT/ML IV SOLN
500.0000 [IU] | Freq: Once | INTRAVENOUS | Status: AC
Start: 2021-04-30 — End: 2021-04-30
  Administered 2021-04-30: 500 [IU] via INTRAVENOUS
  Filled 2021-04-30: qty 5

## 2021-04-30 MED ORDER — GABAPENTIN 300 MG PO CAPS: 300.0000 mg | ORAL_CAPSULE | Freq: Three times a day (TID) | ORAL | 0 refills | Status: DC

## 2021-04-30 NOTE — Patient Instructions (Signed)
[facility]   Discharge Instructions: Thank you for choosing [facility] to provide your oncology and hematology care.  If you have a lab appointment with the [facility], please go directly to the [facility] and check in at the registration area.  Wear comfortable clothing and clothing appropriate for easy access to any Portacath or PICC line.   We strive to give you quality time with your provider. You may need to reschedule your appointment if you arrive late (15 or more minutes).  Arriving late affects you and other patients whose appointments are after yours.  Also, if you miss three or more appointments without notifying the office, you may be dismissed from the clinic at the provider's discretion.      For prescription refill requests, have your pharmacy contact our office and allow 72 hours for refills to be completed.    Today you received the following chemotherapy and/or immunotherapy agents Cetuximab  Cetuximab injection What is this medication? CETUXIMAB (se TUX i mab) is a monoclonal antibody. It is used to treatcolorectal cancer and head and neck cancer. This medicine may be used for other purposes; ask your health care provider orpharmacist if you have questions. COMMON BRAND NAME(S): Erbitux What should I tell my care team before I take this medication? They need to know if you have any of these conditions: heart disease history of irregular heartbeat history of low levels of calcium, magnesium, or potassium in the blood history of tick bites lung or breathing disease, like asthma red meat allergy an unusual or allergic reaction to cetuximab, other medicines, foods, dyes, or preservatives pregnant or trying to get pregnant breast-feeding How should I use this medication? This drug is given as an infusion into a vein. It is administered in a hospitalor clinic by a specially trained health care professional. Talk to  your pediatrician regarding the use of this medicine in children.Special care may be needed. Overdosage: If you think you have taken too much of this medicine contact apoison control center or emergency room at once. NOTE: This medicine is only for you. Do not share this medicine with others. What if I miss a dose? It is important not to miss your dose. Call your doctor or health careprofessional if you are unable to keep an appointment. What may interact with this medication? Interactions are not expected. This list may not describe all possible interactions. Give your health care provider a list of all the medicines, herbs, non-prescription drugs, or dietary supplements you use. Also tell them if you smoke, drink alcohol, or use illegaldrugs. Some items may interact with your medicine. What should I watch for while using this medication? Visit your doctor or health care professional for regular checks on your progress. This drug may make you feel generally unwell. This is not uncommon, as chemotherapy can affect healthy cells as well as cancer cells. Report any side effects. Continue your course of treatment even though you feel ill unlessyour doctor tells you to stop. This medicine can make you more sensitive to the sun. Keep out of the sun while taking this medicine and for 2 months after the last dose. If you cannot avoid being in the sun, wear protective clothing and use sunscreen. Do not use sunlamps or tanning beds/booths. You may need blood work done while you are taking this medicine. In some cases, you may be given additional medicines to help with side effects.Follow all directions for their use. Call  your doctor or health care professional for advice if you get a fever, chills or sore throat, or other symptoms of a cold or flu. Do not treat yourself. This drug decreases your body's ability to fight infections. Try toavoid being around people who are sick. Avoid taking products that contain  aspirin, acetaminophen, ibuprofen, naproxen, or ketoprofen unless instructed by your doctor. These medicines may hide afever. Do not become pregnant while taking this medicine. Women should inform their doctor if they wish to become pregnant or think they might be pregnant. There is a potential for serious side effects to an unborn child. Use adequate birth control methods. Avoid pregnancy for at least 2 months after your last dose. Talk to your health care professional or pharmacist for more information. Do not breast-feed an infant while taking this medicine or during the 2 monthsafter your last dose. What side effects may I notice from receiving this medication? Side effects that you should report to your doctor or health care professionalas soon as possible: allergic reactions like skin rash, itching or hives, swelling of the face, lips, or tongue breathing problems changes in vision fast, irregular heartbeat feeling faint or lightheaded, falls fever, chills mouth sores redness, blistering, peeling or loosening of the skin, including inside the mouth trouble passing urine or change in the amount of urine unusually weak or tired Side effects that usually do not require medical attention (report to yourdoctor or health care professional if they continue or are bothersome): changes in skin like acne, cracks, skin dryness constipation diarrhea headache nail changes nausea, vomiting stomach upset weight loss This list may not describe all possible side effects. Call your doctor for medical advice about side effects. You may report side effects to FDA at1-800-FDA-1088. Where should I keep my medication? This drug is given in a hospital or clinic and will not be stored at home. NOTE: This sheet is a summary. It may not cover all possible information. If you have questions about this medicine, talk to your doctor, pharmacist, orhealth care provider.  2022 Elsevier/Gold Standard (2019-09-20  22:56:04)    To help prevent nausea and vomiting after your treatment, we encourage you to take your nausea medication as directed.  BELOW ARE SYMPTOMS THAT SHOULD BE REPORTED IMMEDIATELY: *FEVER GREATER THAN 100.4 F (38 C) OR HIGHER *CHILLS OR SWEATING *NAUSEA AND VOMITING THAT IS NOT CONTROLLED WITH YOUR NAUSEA MEDICATION *UNUSUAL SHORTNESS OF BREATH *UNUSUAL BRUISING OR BLEEDING *URINARY PROBLEMS (pain or burning when urinating, or frequent urination) *BOWEL PROBLEMS (unusual diarrhea, constipation, pain near the anus) TENDERNESS IN MOUTH AND THROAT WITH OR WITHOUT PRESENCE OF ULCERS (sore throat, sores in mouth, or a toothache) UNUSUAL RASH, SWELLING OR PAIN  UNUSUAL VAGINAL DISCHARGE OR ITCHING   Items with * indicate a potential emergency and should be followed up as soon as possible or go to the Emergency Department if any problems should occur.  Please show the CHEMOTHERAPY ALERT CARD or IMMUNOTHERAPY ALERT CARD at check-in to the Emergency Department and triage nurse.  Should you have questions after your visit or need to cancel or reschedule your appointment, please contact [facility]  [phone] and follow the prompts.  Office hours are 8:00 a.m. to 4:30 p.m. Monday - Friday. Please note that voicemails left after 4:00 p.m. may not be returned until the following business day.  We are closed weekends and major holidays. You have access to a nurse at all times for urgent questions. Please call the main  number to the clinic [phone] and follow the prompts.  For any non-urgent questions, you may also contact your provider using MyChart. We now offer e-Visits for anyone 27 and older to request care online for non-urgent symptoms. For details visit [email or web address].   Also download the MyChart app! Go to the app store, search "MyChart", open the app, select [facility], and log in with your MyChart username and password.  Due to  Covid, a mask is required upon entering the hospital/clinic. If you do not have a mask, one will be given to you upon arrival. For doctor visits, patients may have 1 support person aged 5 or older with them. For treatment visits, patients cannot have anyone with them due to current Covid guidelines and our immunocompromised population.

## 2021-04-30 NOTE — Progress Notes (Signed)
Nutrition Follow-up:  Patient with head and neck cancer.  Completed treatment on 12/10.  Noted recurrence and was being treated at [facility].  Patient resuming treatment locally.  Patient on pembrolizumab and cetuximab.  PEG placement on hold per MD and pt.  Met with patient in infusion.  Patient reports appetite has been good. Eating puree foods. Drinking 4, 350 calorie shakes per day.  Denies nausea or stomach upset.  Noted pain an issue.    Medications: reviewed  Labs: reviewed  Anthropometrics:   Weight 111 lb today  112 lb on 6/22 108 lb on 6/13 111 lb on 4/7 111 lb on 3/15 105 lb on 2/2 111 lb on 1/24   NUTRITION DIAGNOSIS: Inadequate oral intake improving   INTERVENTION:  PEG placement on hold Continue drinking at least 4, 350 calorie shakes per day (1400 calories, 52 g protein) Continue puree foods high in calories and protein    MONITORING, EVALUATION, GOAL: weight trends, intake   NEXT VISIT: ~ 2 weeks  Shannon Schroeder, [place], [place] Registered Dietitian [phone] (mobile)

## 2021-04-30 NOTE — Progress Notes (Signed)
Patient here for oncology follow-up appointment, expresses concerns of face pain and gabapentin refill

## 2021-04-30 NOTE — Progress Notes (Signed)
Hematology/Oncology follow up note [facility] [phone] [phone]   Patient Care Team: Pcp, No as PCP - General Rockey Situ, Kathlene November, MD as PCP - Cardiology (Cardiology) Earlie Server, MD as Consulting Physician (Oncology)  CHIEF COMPLAINTS/REASON FOR VISIT:  Follow-up for head and neck cancer  HISTORY OF PRESENTING ILLNESS:   Shannon Schroeder is a  65 y.o.  female with PMH listed below was seen in consultation at the request of ER Dr. [name] for evaluation of abnormal CT scan.  07/10/2020 she presented emergency room for evaluation of right-sided facial swelling and pain in the right side of her mouth for the past 6 weeks.  It started in her mouth with a small ulcer which progressively got worse.  She wears denture.  Not able to chew well.  She eats soft food. She was found to have right upper gum also with yellowish discharge noted. 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  Avoid piloting.,  Right orbital apex, also the right soft palate and palate and tonsil.  Furthermore there is evidence of intracranial extension along the right V2 at the foramen rotundum Small suspicious right level 1A lymph node measuring 7 mm.  Separate small indeterminate but suspicious enhancing soft tissue nodule of the left sublingual space.  Patient was referred to oncology for further evaluation management. Patient denies fever, chills, nausea vomiting diarrhea shortness of breath or cough.  Reports 10 out of 10 severe pain of right face and numbness.  She also has headache. Reports unintentional weight loss  She reports no contactable family members.  She is in the process of pointing her roommate [name] to be her power of attorney.  She also wants to record today's conversation for her roommate  [name]. Patient has 50-pack-year smoking history.  # seen by ENT Dr.Juengel and had a biopsy. Biopsy was sent to [facility] and the final pathology was positive for invasive squamous cell carcinoma, well-differentiated, keratinizing. 07/17/2020, brain MRI showed large right facial mass with right maxillary erosion involving the maxillary sinus, hard palate and pterygoid plates and muscles.  Perineural spreading along the multiple branches of the maxillary division of the right trigeminal nerve at the infraorbital canal, pterygopalatine fossa and foramen rotundum. 07/17/2020, PET scan showed Intense FDG uptake is associated with the large enhancing tumor which involves the right maxilla, right nasal cavity, right pterygoid palatine fossa, right orbital apex and right soft tissue palate and palatine tonsil. 2. Mild nonspecific FDG uptake is associated with the recently characterized suspicious right level 1A lymph node which measures 7 mm and has an SUV max of 2.12.  No signs of distant metastasis.  Aortic atherosclerosis.  Patient also reports a history of hypertension, dysrhythmia, history of V. tach.  Patient previously was on lisinopril, atenolol, amlodipine for which she is no longer taking at this point due to lack of medication coverage.  She also does not have any primary care provider. 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  On average she says she is takes 2 to 3 pills/day.  She also takes gabapentin 300 mg 3 times daily  # new onset of atrial fibrillation with rapid ventricular  response.  Patient was started on metoprolol.  Eliquis for anticoagulation.  # 09/06/2020- 09/11/2020 admitted due to A. fib, hypotension, multifocal pneumonia.  Patient was treated with IV antibiotics.  Cardiology saw the patient during admission and  added amiodarone.  Patient was discharged on 5 days course of oral antibiotics, continued on metoprolol and amiodarone for atrial fibrillation.  # Dysphagia, she declined PET tube placment.  #08/08/2020-10/11/2020 concurrent cisplatin and radiation. # 01/09/2021, PET scan showed improvement but persistent hypermetabolic activity associated with the right frontal sinus.Marked improvement in the posterior right nasopharynx with decrease in metabolic activity and tissue thickening.  Persistent but improved hypermetabolic thickened tissue in the anterior and medial right maxillary sinus.  Persistent hypermetabolic activity posterior at this level of pterygoid plate on the RIGHT. Potential new activity in the RIGHT skull base inferior medial along the sphenoid bone  # 01/14/2021, She had recurrent maxillary sinus squamous cell carcinoma and was referred to [facility] for discussion of feasibility of residual disease resection.  Patient stated at [facility] for her oncology care after that. Extensive outside medical records review was performed by me via Care Everywhere.   01/27/21 MRI brain with and without contrast were notable for recurrent disease in the right pterygopalatine fossa, along V2/V3 extending into the cavernous sinus and orbital fissure, as possibleperineural spread.  01/29/2021 patient establish care with [facility] Dr. Ihor Austin..  Due to the extent of disease, patient was not considered as a surgical candidate.  Dr.Blumberg sent patient to establish care with [facility] for further discussion. Patient was recommended to proceed with neoadjuvant chemotherapy  Keis regimen followed by reevaluation of surgical resection.  Patient understands that treatment goal include symptom control and increase chance of disease control and her surgical outcome.  Patient understands that if she does not respond, same treatment will be continued but with palliative intent.   Keis regimen : Cetuximab 400 mg/m2 load then 250  mg/m2 weekly, PACLItaxel 135 mg/m2 weekly,CARBOplatin AUC 2 weekly for 6 weeks Alpha gal was negative   02/06/2021  C1D1  Cetux/Taxol/Carbo, able to finish chemo without complications 8/36/6294, C1 D8 Cetux/Taxol/Carbo able to finish chemo without complications 05/6545 Cetux/Taxol/Carbo, she developed hypotension, nausea and flushing.  Patient was evaluated by nurse practitioner.  Was given IV fluid bolus with improvement of BP and the patient was able to finish infusion successfully.   03/04/2021, C1D22, Cetux/Taxol/Carbo Per note, patient developed hypersensitivity to carboplatin. "tolerated Cetuximab and Paclitaxel infusion without sequelae however during her Carbo infusion, she became acutely altered, momentarily unresponsive, acutely desaturated to 70s, then 80s on RA, hypotensive 81/51 with respiratory wheezing appreciated. The infusion was stopped. NS IVF were given along with pepcid & solumedrol. Patient's vital signs slowly responded but not to baseline. Pulse was thready and skin was cold and clammy. She followed some commands, slowly. A rapid response was called for the acute change in mental status and concerning vital signs. Patient was taken to ER for further evaluation and work up"   03/25/2021 C1 D29 Cetux/Taxol/Cisplatin carboplatin was switched to cisplatin Unfortunately patient developed hypersensitivity to cisplatin as well. Patient missed 5/10, 5/17 treatments due to weakness.   04/10/2019 2 repeat MRI brain with and without contrast showed evidence of disease response, however continues to have CN V involvement with extension to the skull base, unlikely to be a surgical candidate.  Her case was noted to be discussed on the head and neck tumor board at [facility]. Patient was recommended to switch to regimen with pembrolizumab and  cetuximab given the intolerance of cis-platinum  INTERVAL HISTORY KRISTELLE CAVALLARO is a 65 y.o. female who has above history reviewed by me today presents for  follow up visit for management of head and neck cancer,  Patient tolerates pured food, reports eating better.  She also takes nutrition supplements. Presents for evaluation prior to chemotherapy.   Patient is on fentanyl patch 37.20mcg/h every 3 days and she takes Percocet 2 tablets twice daily.  She reports the pain is getting worse around her eyes.    Review of Systems  Constitutional:  Positive for fatigue. Negative for appetite change, chills, fever and unexpected weight change.  HENT:   Negative for hearing loss, mouth sores and voice change.        Facial swelling, pain   Eyes:  Negative for eye problems.  Respiratory:  Negative for chest tightness, cough and shortness of breath.   Cardiovascular:  Negative for chest pain and palpitations.  Gastrointestinal:  Negative for abdominal distention, abdominal pain, blood in stool, constipation, nausea and vomiting.       Dysphagia  Endocrine: Negative for hot flashes.  Genitourinary:  Negative for difficulty urinating and frequency.   Musculoskeletal:  Negative for arthralgias.  Skin:  Negative for itching and rash.  Neurological:  Negative for extremity weakness and headaches.  Hematological:  Negative for adenopathy.  Psychiatric/Behavioral:  Negative for confusion.    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    Anxiety    Arthritis    Bipolar disorder (HCC)    COPD (chronic obstructive pulmonary disease) (HCC)    NO INHALERS   Depression    Dysrhythmia    H/O V TACH   Head and neck cancer ([place]) 07/29/2020   Headache    H/O MIGRAINES   Hypertension    Seizures ([place])    X1 AFTER FALL    SURGICAL HISTORY: Past Surgical History:  Procedure Laterality Date   ABDOMINAL HYSTERECTOMY     partial   BREAST SURGERY     biopsy   CARDIAC CATHETERIZATION     X 2   CARPAL TUNNEL RELEASE Right    CARPECTOMY HAND Right     CATARACT EXTRACTION W/PHACO Left 03/08/2018   Procedure: CATARACT EXTRACTION PHACO AND INTRAOCULAR LENS PLACEMENT ([place]);  Surgeon: Birder Robson, MD;  Location: [facility] ORS;  Service: Ophthalmology;  Laterality: Left;  [place] 00:55 AP% 13.5 CDE 7.52 Fluid pack lot # [id number]   CATARACT EXTRACTION W/PHACO Right 04/05/2018   Procedure: CATARACT EXTRACTION PHACO AND INTRAOCULAR LENS PLACEMENT ([facility]);  Surgeon: Birder Robson, MD;  Location: [facility] ORS;  Service: Ophthalmology;  Laterality: Right;  [place] 00:36 AP% 15.9 CDE 5.72 Fluid pack lot # [id number]   CEREBRAL ANEURYSM REPAIR     CHOLECYSTECTOMY     COLONOSCOPY WITH PROPOFOL N/A 04/26/2019   Procedure: COLONOSCOPY WITH PROPOFOL;  Surgeon: Jonathon Bellows, MD;  Location: [facility] ENDOSCOPY;  Service: Gastroenterology;  Laterality: N/A;   PORTACATH PLACEMENT Left 08/05/2020   Procedure: INSERTION PORT-A-CATH;  Surgeon: Herbert Pun, MD;  Location: [facility] ORS;  Service: General;  Laterality: Left;   TUBAL LIGATION      SOCIAL HISTORY: Social History   Socioeconomic History   Marital status: Divorced    Spouse name: Not on file   Number of children: Not on file   Years of education: 16   Highest education level: Bachelor's degree (e.g., BA, AB,  BS)  Occupational History   Occupation: farmer  Tobacco Use   Smoking status: Every Day    Packs/day: 0.25    Years: 50.00    Pack years: 12.50    Types: Cigarettes   Smokeless tobacco: Never  Vaping Use   Vaping Use: Never used  Substance and Sexual Activity   Alcohol use: No   Drug use: No   Sexual activity: Not Currently  Other Topics Concern   Not on file  Social History Narrative   Pt lives at [facility].    Social Determinants of Health   Financial Resource Strain: Not on file  Food Insecurity: Not on file  Transportation Needs: Not on file  Physical Activity: Not on file  Stress: Not on file  Social Connections: Not on file  Intimate Partner Violence: Not on file     FAMILY HISTORY: Family History  Problem Relation Age of Onset   Hypertension Mother    Heart failure Mother    Hypertension Brother    Stroke Brother    Hypertension Son    Emphysema Maternal Aunt    Hypertension Paternal Aunt    Hypertension Paternal Uncle    Hypertension Maternal Grandmother    Hypertension Paternal Grandmother     ALLERGIES:  is allergic to aspirin, belladonna alkaloids, fluoxetine hcl, naproxen, phenobarbital, and prozac [fluoxetine hcl].  MEDICATIONS:  Current Outpatient Medications  Medication Sig Dispense Refill   albuterol (VENTOLIN HFA) 108 (90 Base) MCG/ACT inhaler Inhale 2 puffs into the lungs every 6 (six) hours as needed for wheezing or shortness of breath. 8 g 0   amiodarone (PACERONE) 200 MG tablet Take 1 tablet (200 mg total) by mouth daily. 90 tablet 1   apixaban (ELIQUIS) 5 MG TABS tablet Take 1 tablet (5 mg total) by mouth 2 (two) times daily. 180 tablet 3   docusate sodium (COLACE) 100 MG capsule Take 1 capsule (100 mg total) by mouth daily. 30 capsule 1   feeding supplement, ENSURE ENLIVE, (ENSURE ENLIVE) LIQD Take 237 mLs by mouth 3 (three) times daily between meals. 237 mL 12   fentaNYL 37.5 MCG/HR PT72 Place 37.5 mcg onto the skin every 3 (three) days. 10 patch 0   furosemide (LASIX) 20 MG tablet Take 1 tablet (20 mg total) by mouth daily as needed (for lower extremity swelling). (Patient not taking: No sig reported) 90 tablet 1   gabapentin (NEURONTIN) 300 MG capsule Take 1 capsule (300 mg total) by mouth 3 (three) times daily. 90 capsule 0   magnesium chloride (SLOW-MAG) 64 MG TBEC SR tablet Take 1 tablet (64 mg total) by mouth 2 (two) times daily. 60 tablet 1   Melatonin 10 MG TABS Take 10 mg by mouth at bedtime as needed (sleep).     mirtazapine (REMERON) 15 MG tablet Take 1 tablet (15 mg total) by mouth at bedtime. (Patient not taking: No sig reported) 30 tablet 0   Multiple Vitamins-Minerals (CENTRUM SILVER PO) Take 1 tablet by mouth  daily. Gummie (Patient not taking: Reported on 04/23/2021)     ondansetron (ZOFRAN-ODT) 8 MG disintegrating tablet Take 1 tablet (8 mg total) by mouth every 8 (eight) hours as needed for nausea or vomiting. 90 tablet 0   oxyCODONE-acetaminophen (PERCOCET) 5-325 MG tablet Take 2 tablets by mouth every 6 (six) hours as needed for severe pain. 60 tablet 0   polyethylene glycol powder (GLYCOLAX/MIRALAX) 17 GM/SCOOP powder Take 17 g by mouth daily as needed for mild constipation or moderate constipation. (  Patient not taking: No sig reported) 255 g 0   potassium chloride 20 MEQ/15ML (10%) SOLN Take 30 mLs (40 mEq total) by mouth daily. [employer] to pay for medication 900 mL 1   No current facility-administered medications for this visit.   Facility-Administered Medications Ordered in Other Visits  Medication Dose Route Frequency Provider Last Rate Last Admin   heparin lock flush 100 unit/mL  500 Units Intravenous Once Earlie Server, MD       sodium chloride flush (NS) 0.9 % injection 10 mL  10 mL Intravenous PRN Earlie Server, MD   10 mL at 10/10/20 1040   sodium chloride flush (NS) 0.9 % injection 10 mL  10 mL Intravenous PRN Earlie Server, MD   10 mL at 11/13/20 0836   sodium chloride flush (NS) 0.9 % injection 10 mL  10 mL Intravenous PRN Earlie Server, MD   10 mL at 12/30/20 0838   sodium chloride flush (NS) 0.9 % injection 10 mL  10 mL Intravenous PRN Earlie Server, MD   10 mL at 01/14/21 0921   sodium chloride flush (NS) 0.9 % injection 10 mL  10 mL Intravenous Once Earlie Server, MD         PHYSICAL EXAMINATION: ECOG PERFORMANCE STATUS: 1 - Symptomatic but completely ambulatory Vitals:   04/30/21 0850 04/30/21 0902  BP: 101/64   Pulse: 73   Resp:  17  Temp: (!) 96 F (35.6 C)   SpO2: 100%    Filed Weights   04/30/21 0848  Weight: 111 lb (50.3 kg)    Physical Exam Constitutional:      General: She is not in acute distress.    Comments: Thin built.  HENT:     Head: Normocephalic and atraumatic.      Mouth/Throat:     Comments: No thrush.  Patient wears dentures She has right palate deficiency, with chronic yellowish discharge covering her maxillary sinus surface.  Eyes:     General: No scleral icterus.    Comments: Right eyelid ptosis  Cardiovascular:     Rate and Rhythm: Normal rate and regular rhythm.     Heart sounds: Normal heart sounds.  Pulmonary:     Effort: Pulmonary effort is normal. No respiratory distress.     Breath sounds: No wheezing.  Abdominal:     General: Bowel sounds are normal. There is no distension.     Palpations: Abdomen is soft.  Musculoskeletal:        General: No deformity. Normal range of motion.     Cervical back: Normal range of motion and neck supple.  Skin:    General: Skin is warm and dry.     Findings: No erythema or rash.  Neurological:     Mental Status: She is alert and oriented to person, place, and time. Mental status is at baseline.     Cranial Nerves: No cranial nerve deficit.     Coordination: Coordination normal.  Psychiatric:        Mood and Affect: Mood normal.    LABORATORY DATA:  I have reviewed the data as listed Lab Results  Component Value Date   WBC 11.5 (H) 04/23/2021   HGB 10.2 (L) 04/23/2021   HCT 31.2 (L) 04/23/2021   MCV 102.3 (H) 04/23/2021   PLT 441 (H) 04/23/2021   Recent Labs    07/10/20 1311 07/12/20 1204 08/08/20 0821 12/30/20 0830 01/14/21 0911 01/28/21 0820 04/14/21 1317 04/23/21 0810  NA 136 137   < >  133*   < > 138 129* 132*  K 2.9* 4.0   < > 3.2*   < > 2.8* 4.5 3.8  CL 97* 98   < > 94*   < > 100 92* 93*  CO2 28 27   < > 26   < > 27 29 28   GLUCOSE 100* 114*   < > 92   < > 87 106* 100*  BUN 13 8   < > 14   < > 7* 19 10  CREATININE 0.43* 0.42*   < > 0.36*   < > 0.48 0.46 0.54  CALCIUM 8.9 9.0   < > 8.8*   < > 8.6* 9.0 9.1  GFRNONAA >60 >60   < > >60   < > >60 >60 >60  GFRAA >60 >60  --   --   --   --   --   --   PROT 7.0  --    < > 7.1  --   --  7.1 7.1  ALBUMIN 3.7  --    < > 3.4*  --    --  3.1* 3.1*  AST 20  --    < > 15  --   --  15 17  ALT 16  --    < > 10  --   --  10 11  ALKPHOS 106  --    < > 86  --   --  115 103  BILITOT 0.5  --    < > 0.6  --   --  <0.1* 0.3  BILIDIR  --   --   --   --   --   --  <0.1  --   IBILI  --   --   --   --   --   --  NOT CALCULATED  --    < > = values in this interval not displayed.    Iron/TIBC/Ferritin/ %Sat    Component Value Date/Time   IRON 46 09/09/2020 0938   IRON 37 05/03/2018 1826   TIBC 210 (L) 09/09/2020 0938   FERRITIN 136 09/09/2020 0938   FERRITIN 52 05/03/2018 1826   IRONPCTSAT 22 09/09/2020 0938      RADIOGRAPHIC STUDIES: I have personally reviewed the radiological images as listed and agreed with the findings in the report. No results found.     ASSESSMENT & PLAN:  1. 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[place])   2. Moderate protein-calorie malnutrition ([place])   3. Dysphagia, unspecified type   4. History of atrial fibrillation   5. Hyponatremia   6. Neoplasm related pain   7. Encounter for antineoplastic immunotherapy    #maxillary sinus squamous cell carcinoma, s/p concurrent chemotherapy and radiation-recurrence Unresectable per ENT. Labs reviewed and discussed with patient. Proceed with cycle 1 day 8 cetuximab..  #Malnutrition, dysphagia She appears having better oral intake and has gained weight.  She now pure or her solid food and also takes nutrition supplements. I will hold off previous plan of PEG tube placement.  She gets abdomen CT in case she eventually will need PEG tube placement in the near future.  #Right facial pain /swelling neoplasm related pain Recommend her to increase Percocet 5/325 2 tablets every 6-8 hours as needed.  Continue fentanyl patch to 37.5 MCG.  72 hours. Continue gabapentine 600 mg twice daily  Continue acyclovir 400 mg twice daily for HSV prophylaxis. Continue  Magic mouthwash as needed.  #Hyponatremia, either due to SIADH  secondary to malignancy versus due to decreased oral  intake. Liberate dietary salt intake.  Patient received 1 L of IV normal saline x1 today. #Hypokalemia, she is noncompliant with potassium supplementation.  Recommend patient to continue potassium chloride solution 47meq daily.  Patient will receive IV potassium chloride 36meq x1 today. # Hypomagnesia, magnesium is stable.  Continue Slow-Mag 1 tablet daily.    #Atrial fibrillation on amiodarone and metoprolol.Eliquis  All questions were answered. The patient knows to call the clinic with any problems questions or concerns.   Return of visit:   1 week for lab md cetuximab  Earlie Server, MD, PhD Hematology Oncology [facility] Pager- [phone] 04/30/2021

## 2021-04-30 NOTE — Telephone Encounter (Signed)
[employer] testing request submitted online on oral palate specimen [id number] located at [facility]. Path report, demographic sheet & insurance card copy faxed to [phone].   [id number]

## 2021-05-01 ENCOUNTER — Ambulatory Visit
Admission: RE | Admit: 2021-05-01 | Discharge: 2021-05-01 | Disposition: A | Discharge status: Home / Self Care | Payer: Medicare Other | Source: Ambulatory Visit | Attending: Oncology | Admitting: Oncology

## 2021-05-01 DIAGNOSIS — I700 Atherosclerosis of aorta: Secondary | ICD-10-CM | POA: Diagnosis not present

## 2021-05-01 DIAGNOSIS — C760 Malignant neoplasm of head, face and neck: Secondary | ICD-10-CM | POA: Insufficient documentation

## 2021-05-01 LAB — T4: T4, Total: 7.6 ug/dL (ref 4.5–12.0)

## 2021-05-01 IMAGING — CT CT ABDOMEN W/O CM
2 of 4 series · 17 of 46 positions shown, 19 images · non-contrast
Comparison: None.

CLINICAL DATA: Head and neck carcinoma. Evaluation for percutaneous
gastrostomy tube placement.

EXAM:
CT ABDOMEN WITHOUT CONTRAST
TECHNIQUE: Multidetector CT imaging of the abdomen was performed following the
standard protocol without IV contrast.

[Series 2: routine abd/pel wo · axial · 0.62mm/px · z∈[-135,+105]mm · 14 of 54 slices shown, 16 images]
[im 3/54  soft-tissue]
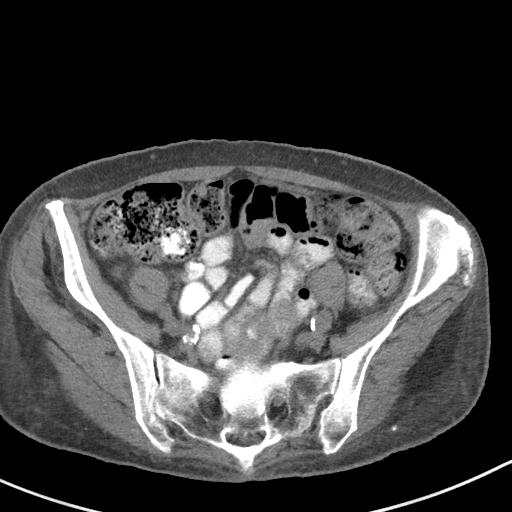
[im 3/54  bone]
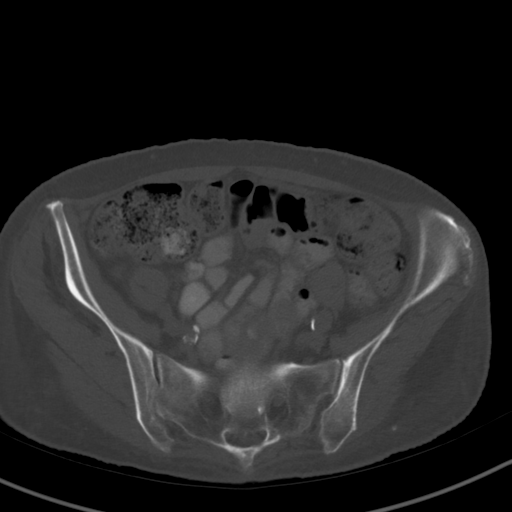
[im 8/54  soft-tissue]
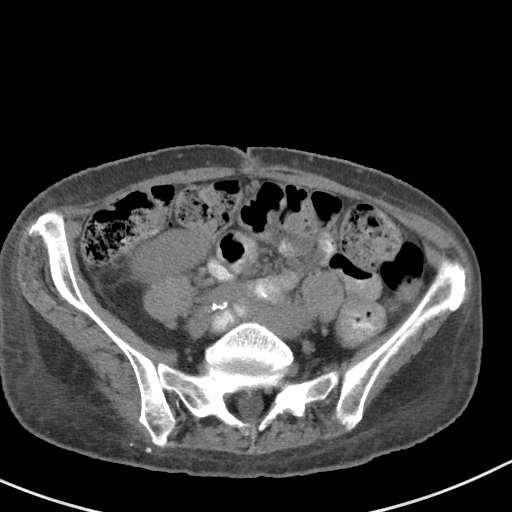
[im 10/54  soft-tissue]
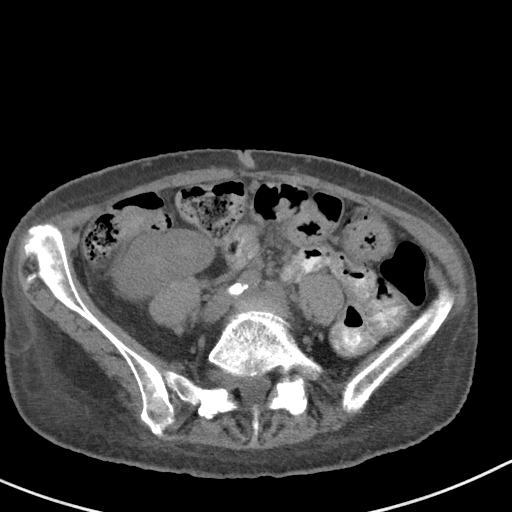
[im 15/54  soft-tissue]
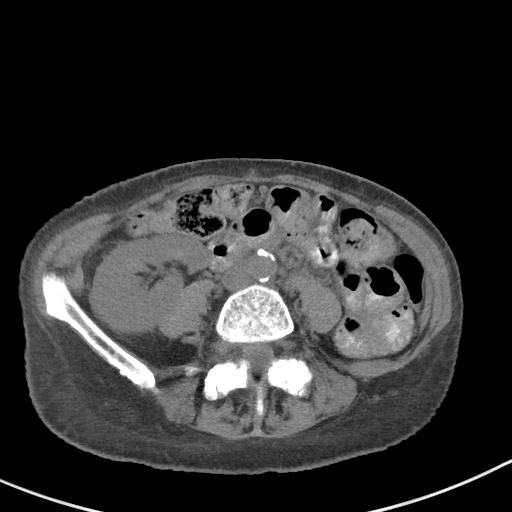
[im 17/54  soft-tissue]
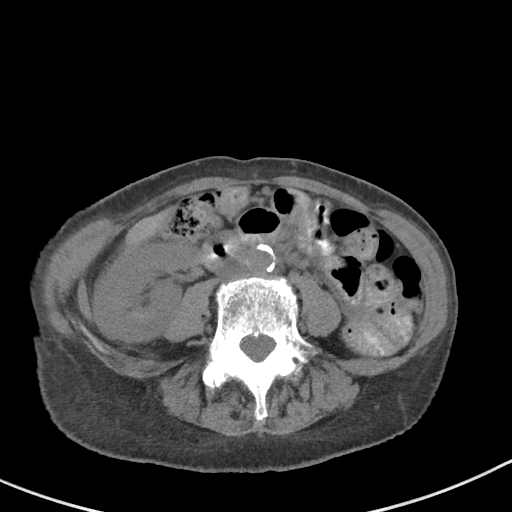
[im 22/54  soft-tissue]
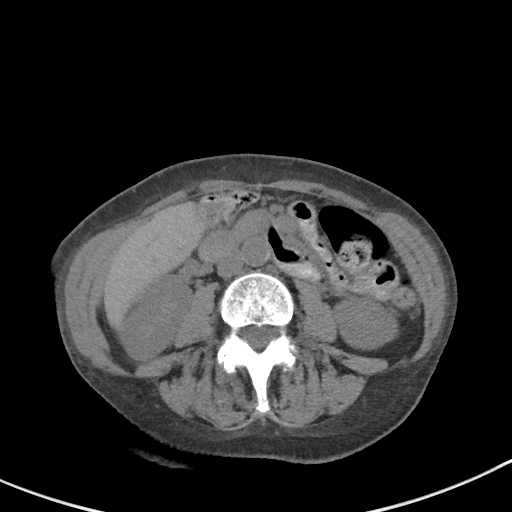
[im 25/54  soft-tissue]
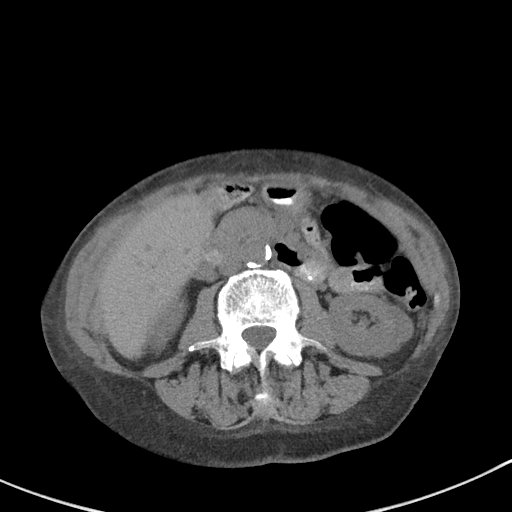
[im 29/54  soft-tissue]
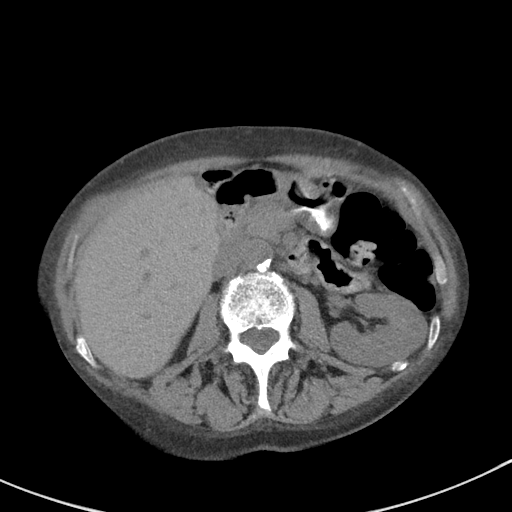
[im 32/54  soft-tissue]
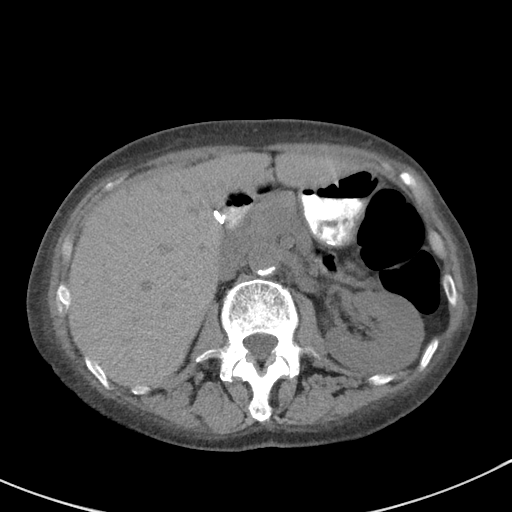
[im 32/54  bone]
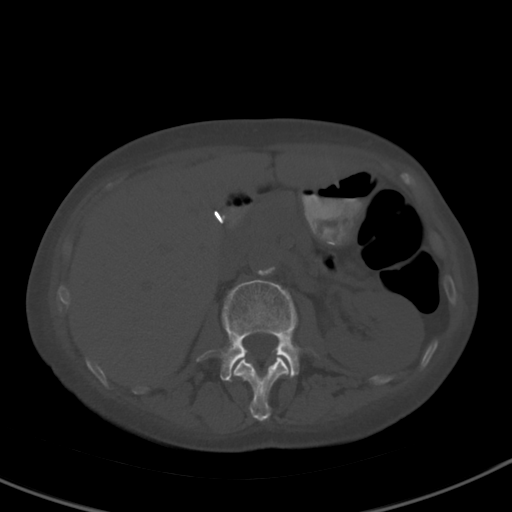
[im 37/54  soft-tissue]
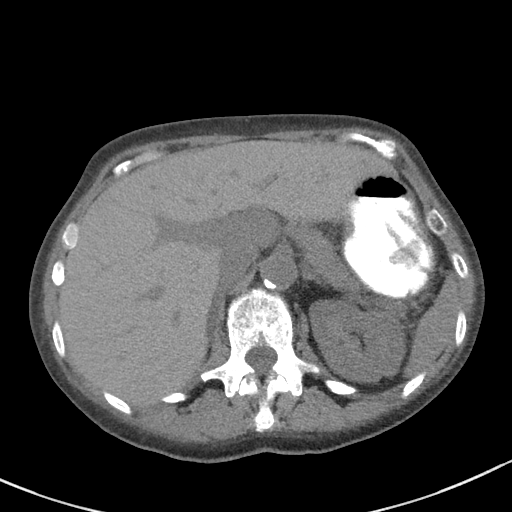
[im 39/54  soft-tissue]
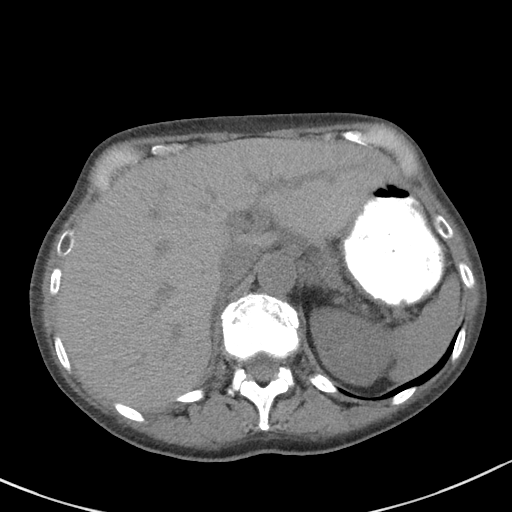
[im 44/54  soft-tissue]
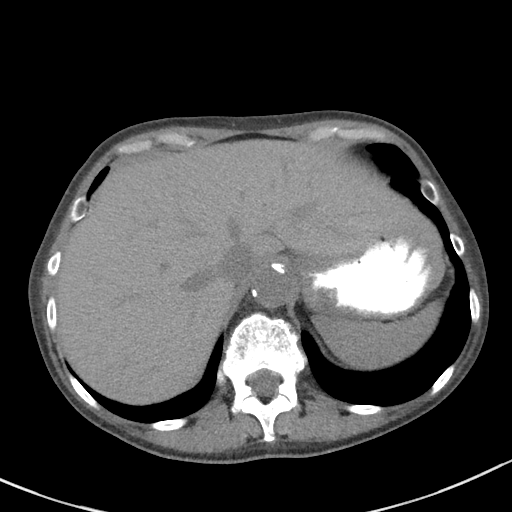
[im 46/54  soft-tissue]
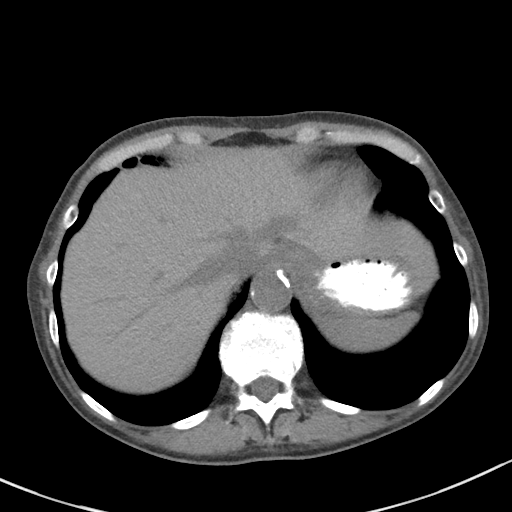
[im 51/54  soft-tissue]
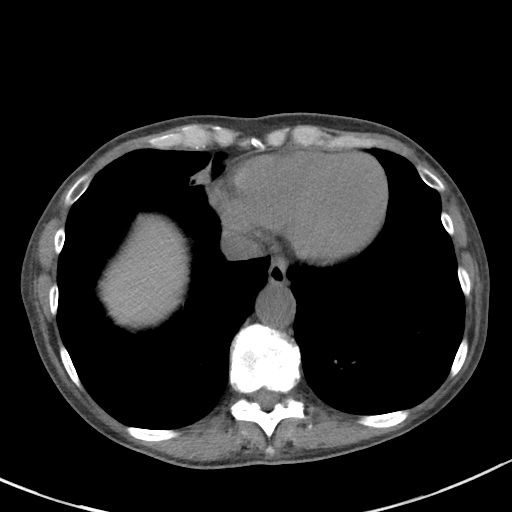

[Series 5: coronal st · coronal · 0.54mm/px · 3 of 73 slices shown]
[im 25/73  soft-tissue]
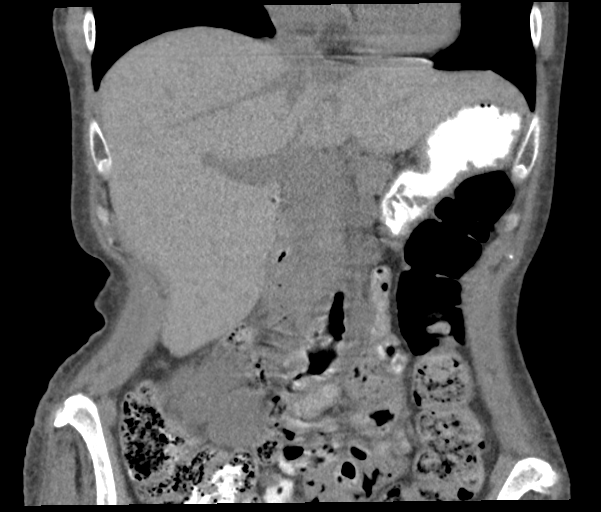
[im 33/73  soft-tissue]
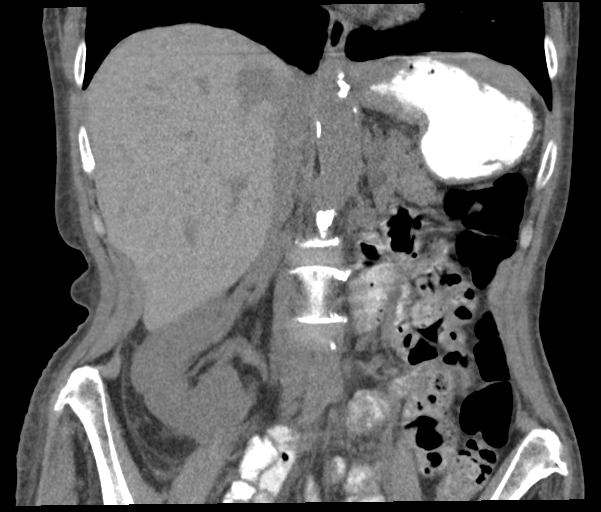
[im 41/73  soft-tissue]
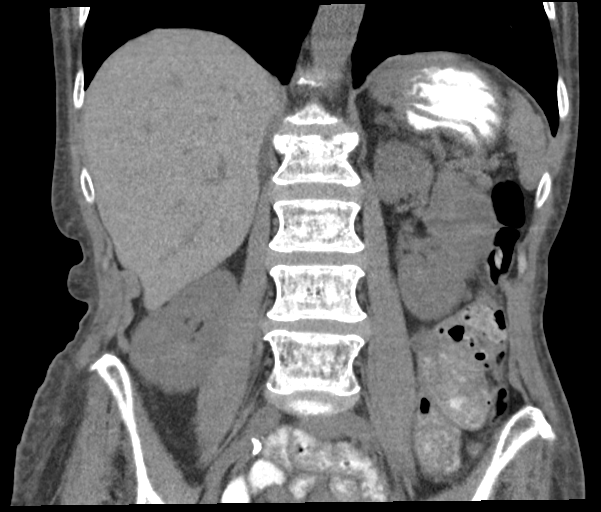

[17 of 46 positions shown; findings below may reference images not displayed]

FINDINGS: Lower chest: No acute findings.

Hepatobiliary: No masses visualized on this unenhanced exam. Prior
cholecystectomy. No evidence of biliary obstruction.

Pancreas: No mass or inflammatory process visualized on this
unenhanced exam.

Spleen:  Within normal limits in size.

Adrenals/Urinary tract: Unremarkable. No evidence of nephrolithiasis
or hydronephrosis.

Stomach/Bowel: Visualized portion unremarkable.

Vascular/Lymphatic: No pathologically enlarged lymph nodes
identified. No evidence of abdominal aortic aneurysm. Aortic
atherosclerotic calcification noted.

Other:  None.

Musculoskeletal: Fracture of the anterior-superior endplate of the
L1 vertebral body is seen, which is acute to subacute in age. No
suspicious lytic or sclerotic bone lesions identified.
IMPRESSION: No evidence of metastatic disease.

Mild L1 vertebral body fracture, which is acute to subacute in age.

Aortic Atherosclerosis ([G1]-[G1]).

## 2021-05-07 ENCOUNTER — Inpatient Hospital Stay: Payer: Medicare Other | Attending: Oncology

## 2021-05-07 ENCOUNTER — Other Ambulatory Visit: Payer: Self-pay

## 2021-05-07 ENCOUNTER — Encounter: Payer: Self-pay | Admitting: Oncology

## 2021-05-07 ENCOUNTER — Inpatient Hospital Stay (HOSPITAL_BASED_OUTPATIENT_CLINIC_OR_DEPARTMENT_OTHER): Payer: Medicare Other | Admitting: Oncology

## 2021-05-07 ENCOUNTER — Inpatient Hospital Stay: Payer: Medicare Other

## 2021-05-07 VITALS — BP 129/76 | HR 104 | Temp 98.1°F | Resp 18 | Wt 112.8 lb

## 2021-05-07 VITALS — HR 97

## 2021-05-07 DIAGNOSIS — E871 Hypo-osmolality and hyponatremia: Secondary | ICD-10-CM | POA: Diagnosis not present

## 2021-05-07 DIAGNOSIS — R5383 Other fatigue: Secondary | ICD-10-CM | POA: Insufficient documentation

## 2021-05-07 DIAGNOSIS — R1310 Dysphagia, unspecified: Secondary | ICD-10-CM | POA: Insufficient documentation

## 2021-05-07 DIAGNOSIS — I10 Essential (primary) hypertension: Secondary | ICD-10-CM | POA: Insufficient documentation

## 2021-05-07 DIAGNOSIS — E876 Hypokalemia: Secondary | ICD-10-CM

## 2021-05-07 DIAGNOSIS — Z79891 Long term (current) use of opiate analgesic: Secondary | ICD-10-CM | POA: Insufficient documentation

## 2021-05-07 DIAGNOSIS — E440 Moderate protein-calorie malnutrition: Secondary | ICD-10-CM

## 2021-05-07 DIAGNOSIS — F17210 Nicotine dependence, cigarettes, uncomplicated: Secondary | ICD-10-CM | POA: Diagnosis not present

## 2021-05-07 DIAGNOSIS — I700 Atherosclerosis of aorta: Secondary | ICD-10-CM | POA: Diagnosis not present

## 2021-05-07 DIAGNOSIS — Z5112 Encounter for antineoplastic immunotherapy: Secondary | ICD-10-CM

## 2021-05-07 DIAGNOSIS — G893 Neoplasm related pain (acute) (chronic): Secondary | ICD-10-CM | POA: Insufficient documentation

## 2021-05-07 DIAGNOSIS — Z7901 Long term (current) use of anticoagulants: Secondary | ICD-10-CM | POA: Diagnosis not present

## 2021-05-07 DIAGNOSIS — Z515 Encounter for palliative care: Secondary | ICD-10-CM | POA: Diagnosis not present

## 2021-05-07 DIAGNOSIS — F319 Bipolar disorder, unspecified: Secondary | ICD-10-CM | POA: Insufficient documentation

## 2021-05-07 DIAGNOSIS — C310 Malignant neoplasm of maxillary sinus: Secondary | ICD-10-CM | POA: Insufficient documentation

## 2021-05-07 DIAGNOSIS — Z8679 Personal history of other diseases of the circulatory system: Secondary | ICD-10-CM

## 2021-05-07 DIAGNOSIS — C760 Malignant neoplasm of head, face and neck: Secondary | ICD-10-CM

## 2021-05-07 DIAGNOSIS — Z79899 Other long term (current) drug therapy: Secondary | ICD-10-CM | POA: Diagnosis not present

## 2021-05-07 DIAGNOSIS — J449 Chronic obstructive pulmonary disease, unspecified: Secondary | ICD-10-CM | POA: Insufficient documentation

## 2021-05-07 DIAGNOSIS — Z8673 Personal history of transient ischemic attack (TIA), and cerebral infarction without residual deficits: Secondary | ICD-10-CM | POA: Diagnosis not present

## 2021-05-07 DIAGNOSIS — Z95828 Presence of other vascular implants and grafts: Secondary | ICD-10-CM

## 2021-05-07 DIAGNOSIS — I4891 Unspecified atrial fibrillation: Secondary | ICD-10-CM | POA: Diagnosis not present

## 2021-05-07 DIAGNOSIS — Z5111 Encounter for antineoplastic chemotherapy: Secondary | ICD-10-CM | POA: Diagnosis present

## 2021-05-07 LAB — COMPREHENSIVE METABOLIC PANEL
ALT: 18 U/L (ref 0–44)
AST: 23 U/L (ref 15–41)
Albumin: 2.9 g/dL — ABNORMAL LOW (ref 3.5–5.0)
Alkaline Phosphatase: 138 U/L — ABNORMAL HIGH (ref 38–126)
Anion gap: 10 (ref 5–15)
BUN: 10 mg/dL (ref 8–23)
CO2: 26 mmol/L (ref 22–32)
Calcium: 8.7 mg/dL — ABNORMAL LOW (ref 8.9–10.3)
Chloride: 94 mmol/L — ABNORMAL LOW (ref 98–111)
Creatinine, Ser: 0.41 mg/dL — ABNORMAL LOW (ref 0.44–1.00)
GFR, Estimated: >60 mL/min (ref >60)
Glucose, Bld: 140 mg/dL — ABNORMAL HIGH (ref 70–99)
Potassium: 3.3 mmol/L — ABNORMAL LOW (ref 3.5–5.1)
Sodium: 130 mmol/L — ABNORMAL LOW (ref 135–145)
Total Bilirubin: 0.2 mg/dL — ABNORMAL LOW (ref 0.3–1.2)
Total Protein: 7.3 g/dL (ref 6.5–8.1)

## 2021-05-07 LAB — CBC WITH DIFFERENTIAL/PLATELET
Abs Immature Granulocytes: 0.06 10*3/uL (ref 0.00–0.07)
Basophils Absolute: 0.1 10*3/uL (ref 0.0–0.1)
Basophils Relative: 1 %
Eosinophils Absolute: 0.6 10*3/uL — ABNORMAL HIGH (ref 0.0–0.5)
Eosinophils Relative: 6 %
HCT: 31 % — ABNORMAL LOW (ref 36.0–46.0)
Hemoglobin: 10.1 g/dL — ABNORMAL LOW (ref 12.0–15.0)
Immature Granulocytes: 1 %
Lymphocytes Relative: 6 %
Lymphs Abs: 0.6 10*3/uL — ABNORMAL LOW (ref 0.7–4.0)
MCH: 33 pg (ref 26.0–34.0)
MCHC: 32.6 g/dL (ref 30.0–36.0)
MCV: 101.3 fL — ABNORMAL HIGH (ref 80.0–100.0)
Monocytes Absolute: 0.9 10*3/uL (ref 0.1–1.0)
Monocytes Relative: 9 %
Neutro Abs: 7.8 10*3/uL — ABNORMAL HIGH (ref 1.7–7.7)
Neutrophils Relative %: 77 %
Platelets: 577 10*3/uL — ABNORMAL HIGH (ref 150–400)
RBC: 3.06 MIL/uL — ABNORMAL LOW (ref 3.87–5.11)
RDW: 15.8 % — ABNORMAL HIGH (ref 11.5–15.5)
WBC: 10 10*3/uL (ref 4.0–10.5)
nRBC: 0 % (ref 0.0–0.2)

## 2021-05-07 LAB — TSH: TSH: 6.714 u[IU]/mL — ABNORMAL HIGH (ref 0.350–4.500)

## 2021-05-07 MED ORDER — OXYCODONE-ACETAMINOPHEN 10-325 MG PO TABS: 1.0000 | ORAL_TABLET | Freq: Four times a day (QID) | ORAL | 0 refills | Status: DC | PRN

## 2021-05-07 MED ORDER — HEPARIN SOD (PORK) LOCK FLUSH 100 UNIT/ML IV SOLN
500.0000 [IU] | Freq: Once | INTRAVENOUS | Status: DC
  Filled 2021-05-07: qty 5

## 2021-05-07 MED ORDER — FAMOTIDINE 20 MG IN NS 100 ML IVPB
20.0000 mg | Freq: Once | INTRAVENOUS | Status: AC
  Administered 2021-05-07: 20 mg via INTRAVENOUS
  Filled 2021-05-07: qty 20

## 2021-05-07 MED ORDER — SODIUM CHLORIDE 0.9 % IV SOLN
Freq: Once | INTRAVENOUS | Status: AC
  Filled 2021-05-07: qty 250

## 2021-05-07 MED ORDER — MONTELUKAST SODIUM 10 MG PO TABS
10.0000 mg | ORAL_TABLET | Freq: Once | ORAL | Status: AC
  Administered 2021-05-07: 10 mg via ORAL
  Filled 2021-05-07: qty 1

## 2021-05-07 MED ORDER — FENTANYL 50 MCG/HR TD PT72: 1.0000 | MEDICATED_PATCH | TRANSDERMAL | 0 refills | Status: DC

## 2021-05-07 MED ORDER — DIPHENHYDRAMINE HCL 50 MG/ML IJ SOLN
50.0000 mg | Freq: Once | INTRAMUSCULAR | Status: AC
  Administered 2021-05-07: 50 mg via INTRAVENOUS
  Filled 2021-05-07: qty 1

## 2021-05-07 MED ORDER — HEPARIN SOD (PORK) LOCK FLUSH 100 UNIT/ML IV SOLN
500.0000 [IU] | Freq: Once | INTRAVENOUS | Status: AC | PRN
  Administered 2021-05-07: 500 [IU]
  Filled 2021-05-07: qty 5

## 2021-05-07 MED ORDER — HEPARIN SOD (PORK) LOCK FLUSH 100 UNIT/ML IV SOLN
INTRAVENOUS | Status: AC
  Filled 2021-05-07: qty 5

## 2021-05-07 MED ORDER — CETUXIMAB CHEMO IV INJECTION 200 MG/100ML
250.0000 mg/m2 | Freq: Once | INTRAVENOUS | Status: AC
  Administered 2021-05-07: 400 mg via INTRAVENOUS
  Filled 2021-05-07: qty 200

## 2021-05-07 MED ORDER — SODIUM CHLORIDE 0.9% FLUSH
10.0000 mL | Freq: Once | INTRAVENOUS | Status: AC
  Administered 2021-05-07: 10 mL via INTRAVENOUS
  Filled 2021-05-07: qty 10

## 2021-05-07 MED ORDER — SODIUM CHLORIDE 0.9 % IV SOLN
10.0000 mg | Freq: Once | INTRAVENOUS | Status: AC
  Administered 2021-05-07: 10 mg via INTRAVENOUS
  Filled 2021-05-07: qty 10

## 2021-05-07 MED ORDER — GABAPENTIN 300 MG PO CAPS: 600.0000 mg | ORAL_CAPSULE | Freq: Three times a day (TID) | ORAL | 1 refills | Status: DC

## 2021-05-07 MED ORDER — POTASSIUM CHLORIDE CRYS ER 20 MEQ PO TBCR: 40.0000 meq | EXTENDED_RELEASE_TABLET | Freq: Every day | ORAL | 1 refills | Status: DC

## 2021-05-07 NOTE — Patient Instructions (Signed)
[facility]  Discharge Instructions: Thank you for choosing [facility] to provide your oncology and hematology care.  If you have a lab appointment with the [facility], please go directly to the [facility] and check in at the registration area.  Wear comfortable clothing and clothing appropriate for easy access to any Portacath or PICC line.   We strive to give you quality time with your provider. You may need to reschedule your appointment if you arrive late (15 or more minutes).  Arriving late affects you and other patients whose appointments are after yours.  Also, if you miss three or more appointments without notifying the office, you may be dismissed from the clinic at the provider's discretion.      For prescription refill requests, have your pharmacy contact our office and allow 72 hours for refills to be completed.    Today you received the following chemotherapy and/or immunotherapy agents: Erbitux      To help prevent nausea and vomiting after your treatment, we encourage you to take your nausea medication as directed.  BELOW ARE SYMPTOMS THAT SHOULD BE REPORTED IMMEDIATELY: *FEVER GREATER THAN 100.4 F (38 C) OR HIGHER *CHILLS OR SWEATING *NAUSEA AND VOMITING THAT IS NOT CONTROLLED WITH YOUR NAUSEA MEDICATION *UNUSUAL SHORTNESS OF BREATH *UNUSUAL BRUISING OR BLEEDING *URINARY PROBLEMS (pain or burning when urinating, or frequent urination) *BOWEL PROBLEMS (unusual diarrhea, constipation, pain near the anus) TENDERNESS IN MOUTH AND THROAT WITH OR WITHOUT PRESENCE OF ULCERS (sore throat, sores in mouth, or a toothache) UNUSUAL RASH, SWELLING OR PAIN  UNUSUAL VAGINAL DISCHARGE OR ITCHING   Items with * indicate a potential emergency and should be followed up as soon as possible or go to the Emergency Department if any problems should occur.  Please show the CHEMOTHERAPY ALERT CARD or IMMUNOTHERAPY ALERT CARD at check-in to  the Emergency Department and triage nurse.  Should you have questions after your visit or need to cancel or reschedule your appointment, please contact [facility]  [phone] and follow the prompts.  Office hours are 8:00 a.m. to 4:30 p.m. Monday - Friday. Please note that voicemails left after 4:00 p.m. may not be returned until the following business day.  We are closed weekends and major holidays. You have access to a nurse at all times for urgent questions. Please call the main number to the clinic [phone] and follow the prompts.  For any non-urgent questions, you may also contact your provider using MyChart. We now offer e-Visits for anyone 51 and older to request care online for non-urgent symptoms. For details visit [email or web address].   Also download the MyChart app! Go to the app store, search "MyChart", open the app, select [facility], and log in with your MyChart username and password.  Due to Covid, a mask is required upon entering the hospital/clinic. If you do not have a mask, one will be given to you upon arrival. For doctor visits, patients may have 1 support person aged 35 or older with them. For treatment visits, patients cannot have anyone with them due to current Covid guidelines and our immunocompromised population. Cetuximab injection What is this medication? CETUXIMAB (se TUX i mab) is a monoclonal antibody. It is used to treatcolorectal cancer and head and neck cancer. This medicine may be used for other purposes; ask your health care provider orpharmacist if you have questions. COMMON BRAND NAME(S): Erbitux What should I tell my care team before I  take this medication? They need to know if you have any of these conditions: heart disease history of irregular heartbeat history of low levels of calcium, magnesium, or potassium in the blood history of tick bites lung or breathing disease, like asthma red meat allergy an  unusual or allergic reaction to cetuximab, other medicines, foods, dyes, or preservatives pregnant or trying to get pregnant breast-feeding How should I use this medication? This drug is given as an infusion into a vein. It is administered in a hospitalor clinic by a specially trained health care professional. Talk to your pediatrician regarding the use of this medicine in children.Special care may be needed. Overdosage: If you think you have taken too much of this medicine contact apoison control center or emergency room at once. NOTE: This medicine is only for you. Do not share this medicine with others. What if I miss a dose? It is important not to miss your dose. Call your doctor or health careprofessional if you are unable to keep an appointment. What may interact with this medication? Interactions are not expected. This list may not describe all possible interactions. Give your health care provider a list of all the medicines, herbs, non-prescription drugs, or dietary supplements you use. Also tell them if you smoke, drink alcohol, or use illegaldrugs. Some items may interact with your medicine. What should I watch for while using this medication? Visit your doctor or health care professional for regular checks on your progress. This drug may make you feel generally unwell. This is not uncommon, as chemotherapy can affect healthy cells as well as cancer cells. Report any side effects. Continue your course of treatment even though you feel ill unlessyour doctor tells you to stop. This medicine can make you more sensitive to the sun. Keep out of the sun while taking this medicine and for 2 months after the last dose. If you cannot avoid being in the sun, wear protective clothing and use sunscreen. Do not use sunlamps or tanning beds/booths. You may need blood work done while you are taking this medicine. In some cases, you may be given additional medicines to help with side effects.Follow all  directions for their use. Call your doctor or health care professional for advice if you get a fever, chills or sore throat, or other symptoms of a cold or flu. Do not treat yourself. This drug decreases your body's ability to fight infections. Try toavoid being around people who are sick. Avoid taking products that contain aspirin, acetaminophen, ibuprofen, naproxen, or ketoprofen unless instructed by your doctor. These medicines may hide afever. Do not become pregnant while taking this medicine. Women should inform their doctor if they wish to become pregnant or think they might be pregnant. There is a potential for serious side effects to an unborn child. Use adequate birth control methods. Avoid pregnancy for at least 2 months after your last dose. Talk to your health care professional or pharmacist for more information. Do not breast-feed an infant while taking this medicine or during the 2 monthsafter your last dose. What side effects may I notice from receiving this medication? Side effects that you should report to your doctor or health care professionalas soon as possible: allergic reactions like skin rash, itching or hives, swelling of the face, lips, or tongue breathing problems changes in vision fast, irregular heartbeat feeling faint or lightheaded, falls fever, chills mouth sores redness, blistering, peeling or loosening of the skin, including inside the mouth trouble passing urine or change in  the amount of urine unusually weak or tired Side effects that usually do not require medical attention (report to yourdoctor or health care professional if they continue or are bothersome): changes in skin like acne, cracks, skin dryness constipation diarrhea headache nail changes nausea, vomiting stomach upset weight loss This list may not describe all possible side effects. Call your doctor for medical advice about side effects. You may report side effects to FDA  at1-800-FDA-1088. Where should I keep my medication? This drug is given in a hospital or clinic and will not be stored at home. NOTE: This sheet is a summary. It may not cover all possible information. If you have questions about this medicine, talk to your doctor, pharmacist, orhealth care provider.  2022 [employer] Standard (2019-09-20 22:56:04)

## 2021-05-07 NOTE — Progress Notes (Signed)
Patient here for follow up. Pt reports that she needs a refill on gabapentin and percocet. She would also like to switch to potassium pills, as the liquid is burning her throat.

## 2021-05-07 NOTE — Progress Notes (Signed)
Hematology/Oncology follow up note [facility] [phone] [phone]   Patient Care Team: Pcp, No as PCP - General Shannon Situ, Kathlene November, [facility] as PCP - Cardiology (Cardiology) Shannon [facility] as Consulting Physician (Oncology)  CHIEF COMPLAINTS/REASON FOR VISIT:  Follow-up for head and neck cancer  HISTORY OF PRESENTING ILLNESS:   Shannon Schroeder is a  65 y.o.  female with PMH listed below was seen in consultation at the request of Shannon Schroeder Dr. Caryn Schroeder for evaluation of abnormal CT scan.  07/10/2020 she presented emergency room for evaluation of right-sided facial swelling and pain in the right side of her mouth for the past 6 weeks.  It started in her mouth with a small ulcer which progressively got worse.  She wears denture.  Not able to chew well.  She eats soft food. She was found to have right upper gum also with yellowish discharge noted. 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  Avoid piloting.,  Right orbital apex, also the right soft palate and palate and tonsil.  Furthermore there is evidence of intracranial extension along the right V2 at the foramen rotundum Small suspicious right level 1A lymph node measuring 7 mm.  Separate small indeterminate but suspicious enhancing soft tissue nodule of the left sublingual space.  Patient was referred to oncology for further evaluation management. Patient denies fever, chills, nausea vomiting diarrhea shortness of breath or cough.  Reports 10 out of 10 severe pain of right face and numbness.  She also has headache. Reports unintentional weight loss  She reports no contactable family members.  She is in the process of pointing her roommate Shannon Schroeder to be her power of attorney.  She also wants to record today's conversation for her roommate  Shannon Schroeder. Patient has 50-pack-year smoking history.  # seen by ENT Shannon [facility]roeder and had a biopsy. Biopsy was sent to [facility] and the final pathology was positive for invasive squamous cell carcinoma, well-differentiated, keratinizing. 07/17/2020, brain MRI showed large right facial mass with right maxillary erosion involving the maxillary sinus, hard palate and pterygoid plates and muscles.  Perineural spreading along the multiple branches of the maxillary division of the right trigeminal nerve at the infraorbital canal, pterygopalatine fossa and foramen rotundum. 07/17/2020, PET scan showed Intense FDG uptake is associated with the large enhancing tumor which involves the right maxilla, right nasal cavity, right pterygoid palatine fossa, right orbital apex and right soft tissue palate and palatine tonsil. 2. Mild nonspecific FDG uptake is associated with the recently characterized suspicious right level 1A lymph node which measures 7 mm and has an SUV max of 2.12.  No Schroeder of distant metastasis.  Aortic atherosclerosis.  Patient also reports a history of hypertension, dysrhythmia, history of V. tach.  Patient previously was on lisinopril, atenolol, amlodipine for which she is no longer taking at this point due to lack of medication coverage.  She also does not have any primary care provider. 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  On average she says she is takes 2 to 3 pills/day.  She also takes gabapentin 300 mg 3 times daily  # new onset of atrial fibrillation with rapid ventricular  response.  Patient was started on metoprolol.  Eliquis for anticoagulation.  # 09/06/2020- 09/11/2020 admitted due to A. fib, hypotension, multifocal pneumonia.  Patient was treated with IV antibiotics.  Cardiology saw the patient during admission and  added amiodarone.  Patient was discharged on 5 days course of oral antibiotics, continued on metoprolol and amiodarone for atrial fibrillation.  # Dysphagia, she declined PET tube placment.  #08/08/2020-10/11/2020 concurrent cisplatin and radiation. # 01/09/2021, PET scan showed improvement but persistent hypermetabolic activity associated with the right frontal sinus.Marked improvement in the posterior right nasopharynx with decrease in metabolic activity and tissue thickening.  Persistent but improved hypermetabolic thickened tissue in the anterior and medial right maxillary sinus.  Persistent hypermetabolic activity posterior at this level of pterygoid plate on the RIGHT. Potential new activity in the RIGHT skull base inferior medial along the sphenoid bone  # 01/14/2021, She had recurrent maxillary sinus squamous cell carcinoma and was referred to [facility] for discussion of feasibility of residual disease resection.  Patient stated at [facility] [place] for her oncology care after that. Extensive outside medical records review was performed by me via Care Everywhere.   01/27/21 MRI brain with and without contrast were notable for recurrent disease in the right pterygopalatine fossa, along V2/V3 extending into the cavernous sinus and orbital fissure, as possibleperineural spread.  01/29/2021 patient establish care with [facility] Dr. Ihor Schro[facility]..  Due to the extent of disease, patient was not considered as a surgical candidate.  Shannon Schroeder sent patient to establish care with Shannon [facility]roeder for further discussion. Patient was recommended to proceed with neoadjuvant chemotherapy  Keis regimen followed by reevaluation of surgical resection.  Patient understands that treatment goal include symptom control and increase chance of disease control and her surgical outcome.  Patient understands that if she does not respond, same treatment will be continued but with palliative intent.   Keis regimen : Cetuximab 400 mg/m2 load then 250  mg/m2 weekly, PACLItaxel 135 mg/m2 weekly,CARBOplatin AUC 2 weekly for 6 weeks Alpha gal was negative   02/06/2021  C1D1  Cetux/Taxol/Carbo, able to finish chemo without complications 02/23/5360, C1 D8 Cetux/Taxol/Carbo able to finish chemo without complications 01/4314 Cetux/Taxol/Carbo, she developed hypotension, nausea and flushing.  Patient was evaluated by nurse practitioner.  Was given IV fluid bolus with improvement of BP and the patient was able to finish infusion successfully.   03/04/2021, C1D22, Cetux/Taxol/Carbo Per note, patient developed hypersensitivity to carboplatin. "tolerated Cetuximab and Paclitaxel infusion without sequelae however during her Carbo infusion, she became acutely altered, momentarily unresponsive, acutely desaturated to 70s, then 80s on RA, hypotensive 81/51 with respiratory wheezing appreciated. The infusion was stopped. NS IVF were given along with pepcid & solumedrol. Patient's vital [facility] slowly responded but not to baseline. Pulse was thready and skin was cold and clammy. She followed some commands, slowly. A rapid response was called for the acute change in mental status and concerning vital [facility]. Patient was taken to [facility]ro[facility] for further evaluation and work up"   03/25/2021 C1 D29 Cetux/Taxol/Cisplatin carboplatin was switched to cisplatin Unfortunately patient developed hypersensitivity to cisplatin as well. Patient missed 5/10, 5/17 treatments due to weakness.   04/10/2019 2 repeat MRI brain with and without contrast showed evidence of disease response, however continues to have CN V involvement with extension to the skull base, unlikely to be a surgical candidate.  Her case was noted to be discussed on the head and neck tumor board at [facility]. Patient was recommended to switch to regimen with pembrolizumab and  cetuximab given the intolerance of cis-platinum  INTERVAL HISTORY Shannon Schro[facility] is a 65 y.o. female who has above history reviewed by me today presents for  follow up visit for management of head and neck cancer,  Patient tolerates pured food, reports eating better.  She also takes nutrition supplements. Presents for evaluation prior to chemotherapy.   Patient is on fentanyl patch 37.53mcg/h every 3 days and she takes Percocet 2 tablets every 6 hours as needed. She is anxious that she is running of gabapentin and Percocet.  Pain regimen helps her pain partially. Patient reports eating well.  She has gained weight.    Review of Systems  Constitutional:  Positive for fatigue. Negative for appetite change, chills, fever and unexpected weight change.  HENT:   Negative for hearing loss, mouth sores and voice change.        Facial swelling, pain   Eyes:  Negative for eye problems.  Respiratory:  Negative for chest tightness, cough and shortness of breath.   Cardiovascular:  Negative for chest pain and palpitations.  Gastrointestinal:  Negative for abdominal distention, abdominal pain, blood in stool, constipation, nausea and vomiting.       Dysphagia  Endocrine: Negative for hot flashes.  Genitourinary:  Negative for difficulty urinating and frequency.   Musculoskeletal:  Negative for arthralgias.  Skin:  Negative for itching and rash.  Neurological:  Negative for extremity weakness and headaches.  Hematological:  Negative for adenopathy.  Psychiatric/Behavioral:  Negative for confusion.    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    Anxiety    Arthritis    Bipolar disorder (HCC)    COPD (chronic obstructive pulmonary disease) (HCC)    NO INHALERS   Depression    Dysrhythmia    H/O V TACH   Head and neck cancer ([place]) 07/29/2020   Headache    H/O MIGRAINES   Hypertension    Seizures ([place])    X1 AFTER FALL    SURGICAL HISTORY: Past Surgical History:  Procedure Laterality Date   ABDOMINAL HYSTERECTOMY     partial   BREAST  SURGERY     biopsy   CARDIAC CATHETERIZATION     X 2   CARPAL TUNNEL RELEASE Right    CARPECTOMY HAND Right    CATARACT EXTRACTION W/PHACO Left 03/08/2018   Procedure: CATARACT EXTRACTION PHACO AND INTRAOCULAR LENS PLACEMENT ([place]);  Surgeon: Birder Robson, [facility];  Location: [facility] ORS;  Service: Ophthalmology;  Laterality: Left;  [place] 00:55 AP% 13.5 CDE 7.52 Fluid pack lot # [id number]   CATARACT EXTRACTION W/PHACO Right 04/05/2018   Procedure: CATARACT EXTRACTION PHACO AND INTRAOCULAR LENS PLACEMENT ([facility]);  Surgeon: Birder Robson, [facility];  Location: [facility] ORS;  Service: Ophthalmology;  Laterality: Right;  [place] 00:36 AP% 15.9 CDE 5.72 Fluid pack lot # [id number]   CEREBRAL ANEURYSM REPAIR     CHOLECYSTECTOMY     COLONOSCOPY WITH PROPOFOL N/A 04/26/2019   Procedure: COLONOSCOPY WITH PROPOFOL;  Surgeon: Jonathon Bellows, [facility];  Location[facility]COPY;  Service: Gastroenterology;  Laterality: N/A;   PORTACATH PLACEMENT Left 08/05/2020   Procedure: INSERTION PORT-A-CATH;  Surgeon: Herbert Pun, [facility];  Location: [facility] ORS;  Service: General;  Laterality: Left;   TUBAL LIGATION      SOCIAL HISTORY: Social History   Socioeconomic History   Marital status: Divorced    Spouse name: Not on file   Number of children:  Not on file   Years of education: 16   Highest education level: Bachelor's degree (e.g., BA, AB, BS)  Occupational History   Occupation: farmer  Tobacco Use   Smoking status: Every Day    Packs/day: 0.25    Years: 50.00    Pack years: 12.50    Types: Cigarettes   Smokeless tobacco: Never  Vaping Use   Vaping Use: Never used  Substance and Sexual Activity   Alcohol use: No   Drug use: No   Sexual activity: Not Currently  Other Topics Concern   Not on file  Social History Narrative   Pt lives at [facility] [place] [facility].    Social Determinants of Health   Financial Resource Strain: Not on file  Food Insecurity: Not on file  Transportation Needs: Not on file  Physical Activity:  Not on file  Stress: Not on file  Social Connections: Not on file  Intimate Partner Violence: Not on file    FAMILY HISTORY: Family History  Problem Relation Age of Onset   Hypertension Mother    Heart failure Mother    Hypertension Brother    Stroke Brother    Hypertension Son    Emphysema Maternal Aunt    Hypertension Paternal Aunt    Hypertension Paternal Uncle    Hypertension Maternal Grandmother    Hypertension Paternal Grandmother     ALLERGIES:  is allergic to aspirin, belladonna alkaloids, fluoxetine hcl, naproxen, phenobarbital, and prozac [fluoxetine hcl].  MEDICATIONS:  Current Outpatient Medications  Medication Sig Dispense Refill   albuterol (VENTOLIN HFA) 108 (90 Base) MCG/ACT inhaler Inhale 2 puffs into the lungs every 6 (six) hours as needed for wheezing or shortness of breath. 8 g 0   amiodarone (PACERONE) 200 MG tablet Take 1 tablet (200 mg total) by mouth daily. 90 tablet 1   apixaban (ELIQUIS) 5 MG TABS tablet Take 1 tablet (5 mg total) by mouth 2 (two) times daily. 180 tablet 3   docusate sodium (COLACE) 100 MG capsule Take 1 capsule (100 mg total) by mouth daily. 30 capsule 1   feeding supplement, ENSURE ENLIVE, (ENSURE ENLIVE) LIQD Take 237 mLs by mouth 3 (three) times daily between meals. 237 mL 12   fentaNYL (DURAGESIC) 50 MCG/HR Place 1 patch onto the skin every 3 (three) days. 10 patch 0   magnesium chloride (SLOW-MAG) 64 MG TBEC SR tablet Take 1 tablet (64 mg total) by mouth 2 (two) times daily. 60 tablet 1   Melatonin 10 MG TABS Take 10 mg by mouth at bedtime as needed (sleep).     ondansetron (ZOFRAN-ODT) 8 MG disintegrating tablet Take 1 tablet (8 mg total) by mouth every 8 (eight) hours as needed for nausea or vomiting. 90 tablet 0   oxyCODONE-acetaminophen (PERCOCET) 10-325 MG tablet Take 1 tablet by mouth every 6 (six) hours as needed for pain. 60 tablet 0   potassium chloride SA (KLOR-CON) 20 MEQ tablet Take 2 tablets (40 mEq total) by mouth  daily. 30 tablet 1   furosemide (LASIX) 20 MG tablet Take 1 tablet (20 mg total) by mouth daily as needed (for lower extremity swelling). (Patient not taking: No sig reported) 90 tablet 1   gabapentin (NEURONTIN) 300 MG capsule Take 2 capsules (600 mg total) by mouth 3 (three) times daily. 180 capsule 1   mirtazapine (REMERON) 15 MG tablet Take 1 tablet (15 mg total) by mouth at bedtime. (Patient not taking: No sig reported) 30 tablet 0   Multiple Vitamins-Minerals (CENTRUM SILVER  PO) Take 1 tablet by mouth daily. Gummie (Patient not taking: No sig reported)     polyethylene glycol powder (GLYCOLAX/MIRALAX) 17 GM/SCOOP powder Take 17 g by mouth daily as needed for mild constipation or moderate constipation. (Patient not taking: No sig reported) 255 g 0   No current facility-administered medications for this visit.   Facility-Administered Medications Ordered in Other Visits  Medication Dose Route Frequency Provider Last Rate Last Admin   sodium chloride flush (NS) 0.9 % injection 10 mL  10 mL Intravenous PRN Shannon Schroeder   10 mL at 10/10/20 1040   sodium chloride flush (NS) 0.9 % injection 10 mL  10 mL Intravenous PRN Shannon Schroeder   10 mL at 11/13/20 0836   sodium chloride flush (NS) 0.9 % injection 10 mL  10 mL Intravenous PRN Shannon Schroeder   10 mL at 12/30/20 0838   sodium chloride flush (NS) 0.9 % injection 10 mL  10 mL Intravenous PRN Shannon Schroeder   10 mL at 01/14/21 0921     PHYSICAL EXAMINATION: ECOG PERFORMANCE STATUS: 1 - Symptomatic but completely ambulatory Vitals:   05/07/21 1040  BP: 129/76  Pulse: (!) 104  Resp: 18  Temp: 98.1 F (36.7 C)  SpO2: 99%   Filed Weights   05/07/21 1040  Weight: 112 lb 12.8 oz (51.2 kg)    Physical Exam Constitutional:      General: She is not in acute distress.    Comments: Thin built.  HENT:     Head: Normocephalic and atraumatic.     Mouth/Throat:     Comments: Patient wears dentures She has right palate deficiency, with chronic  yellowish discharge covering her maxillary sinus surface.  Eyes:     General: No scleral icterus.    Comments: Right eyelid ptosis  Cardiovascular:     Rate and Rhythm: Normal rate and regular rhythm.     Heart sounds: Normal heart sounds.  Pulmonary:     Effort: Pulmonary effort is normal. No respiratory distress.     Breath sounds: No wheezing.  Abdominal:     General: Bowel sounds are normal. There is no distension.     Palpations: Abdomen is soft.  Musculoskeletal:        General: No deformity. Normal range of motion.     Cervical back: Normal range of motion and neck supple.  Skin:    General: Skin is warm and dry.     Findings: No erythema or rash.  Neurological:     Mental Status: She is alert and oriented to person, place, and time. Mental status is at baseline.     Cranial Nerves: No cranial nerve deficit.     Coordination: Coordination normal.  Psychiatric:        Mood and Affect: Mood normal.    LABORATORY DATA:  I have reviewed the data as listed Lab Results  Component Value Date   WBC 10.0 05/07/2021   HGB 10.1 (L) 05/07/2021   HCT 31.0 (L) 05/07/2021   MCV 101.3 (H) 05/07/2021   PLT 577 (H) 05/07/2021   Recent Labs    07/10/20 1311 07/12/20 1204 08/08/20 0821 04/14/21 1317 04/23/21 0810 04/30/21 0834 05/07/21 1016  NA 136 137   < > 129* 132* 129* 130*  K 2.9* 4.0   < > 4.5 3.8 3.3* 3.3*  CL 97* 98   < > 92* 93* 94* 94*  CO2 28 27   < > 29 28 24  26  GLUCOSE 100* 114*   < > 106* 100* 108* 140*  BUN 13 8   < > 19 10 10 10   CREATININE 0.43* 0.42*   < > 0.46 0.54 0.43* 0.41*  CALCIUM 8.9 9.0   < > 9.0 9.1 8.5* 8.7*  GFRNONAA >60 >60   < > >60 >60 >60 >60  GFRAA >60 >60  --   --   --   --   --   PROT 7.0  --    < > 7.1 7.1 7.0 7.3  ALBUMIN 3.7  --    < > 3.1* 3.1* 3.0* 2.9*  AST 20  --    < > 15 17 19 23   ALT 16  --    < > 10 11 16 18   ALKPHOS 106  --    < > 115 103 108 138*  BILITOT 0.5  --    < > <0.1* 0.3 0.4 0.2*  BILIDIR  --   --   --  <0.1   --   --   --   IBILI  --   --   --  NOT CALCULATED  --   --   --    < > = values in this interval not displayed.    Iron/TIBC/Ferritin/ %Sat    Component Value Date/Time   IRON 46 09/09/2020 0938   IRON 37 05/03/2018 1826   TIBC 210 (L) 09/09/2020 0938   FERRITIN 136 09/09/2020 0938   FERRITIN 52 05/03/2018 1826   IRONPCTSAT 22 09/09/2020 0938      RADIOGRAPHIC STUDIES: I have personally reviewed the radiological images as listed and agreed with the findings in the report. CT Abdomen Wo Contrast  Result Date: 05/02/2021 CLINICAL DATA:  Head and neck carcinoma. Evaluation for percutaneous gastrostomy tube placement. EXAM: CT ABDOMEN WITHOUT CONTRAST TECHNIQUE: Multidetector CT imaging of the abdomen was performed following the standard protocol without IV contrast. COMPARISON:  None. FINDINGS: Lower chest: No acute findings. Hepatobiliary: No masses visualized on this unenhanced exam. Prior cholecystectomy. No evidence of biliary obstruction. Pancreas: No mass or inflammatory process visualized on this unenhanced exam. Spleen:  Within normal limits in size. Adrenals/Urinary tract: Unremarkable. No evidence of nephrolithiasis or hydronephrosis. Stomach/Bowel: Visualized portion unremarkable. Vascular/Lymphatic: No pathologically enlarged lymph nodes identified. No evidence of abdominal aortic aneurysm. Aortic atherosclerotic calcification noted. Other:  None. Musculoskeletal: Fracture of the anterior-superior endplate of the L1 vertebral body is seen, which is acute to subacute in age. No suspicious lytic or sclerotic bone lesions identified. IMPRESSION: No evidence of metastatic disease. Mild L1 vertebral body fracture, which is acute to subacute in age. Aortic Atherosclerosis (ICD10-I70.0). Electronically Signed   By: Marlaine Hind M.D.   On: 05/02/2021 14:43       ASSESSMENT & PLAN:  1. 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[place])   2. History of atrial fibrillation   3. Moderate protein-calorie  malnutrition ([place])   4. Hypokalemia   5. Hypomagnesemia   6. Neoplasm related pain   7. Encounter for antineoplastic immunotherapy    #maxillary sinus squamous cell carcinoma, s/p concurrent chemotherapy and radiation-recurrence-Unresectable  Labs are reviewed and discussed with patient. Proceed with cycle 1 day 15 cetuximab..  #Malnutrition, dysphagia She appears having better oral intake and has gained weight.  We will hold off tube placement at this point.  Continue nutrition supplementation.  #Right facial pain /swelling neoplasm related pain. increase fentanyl patch to 50 MCG/h Q72 hours  Increase gabapentin 600 mg three times  daily, other options may try carbamazepine.  Will refer patient to re-establish care with palliative care service for further management.  Consider referral to pain management for nerve block.  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 Continue  Magic mouthwash as needed.  #Hyponatremia, either due to SIADH secondary to malignancy versus due to decreased oral intake. Liberate dietary salt intake.   #Hypokalemia, continue potassium chloride 70meq daily.  Per patient's request, I will switch Rx to potassium tablets. # Hypomagnesia, magnesium is stable.  Continue Slow-Mag 1 tablet daily.    #Atrial fibrillation on amiodarone and metoprolol.Eliquis  All questions were answered. The patient knows to call the clinic with any problems questions or concerns.   Return of visit:   1 week for lab [facility] pembrolizumab/cetuximab  Shannon Server, MD, PhD Hematology Oncology [facility] Pager- [phone] 05/07/2021

## 2021-05-08 ENCOUNTER — Ambulatory Visit: Payer: Medicare Other

## 2021-05-08 LAB — T4: T4, Total: 8.1 ug/dL (ref 4.5–12.0)

## 2021-05-14 ENCOUNTER — Inpatient Hospital Stay: Payer: Medicare Other

## 2021-05-14 ENCOUNTER — Inpatient Hospital Stay (HOSPITAL_BASED_OUTPATIENT_CLINIC_OR_DEPARTMENT_OTHER): Payer: Medicare Other | Admitting: Hospice and Palliative Medicine

## 2021-05-14 ENCOUNTER — Encounter: Payer: Self-pay | Admitting: Oncology

## 2021-05-14 ENCOUNTER — Inpatient Hospital Stay (HOSPITAL_BASED_OUTPATIENT_CLINIC_OR_DEPARTMENT_OTHER): Payer: Medicare Other | Admitting: Oncology

## 2021-05-14 ENCOUNTER — Other Ambulatory Visit: Payer: Self-pay

## 2021-05-14 VITALS — BP 128/85 | HR 90 | Temp 97.0°F | Resp 17 | Wt 112.9 lb

## 2021-05-14 VITALS — BP 150/81 | HR 99 | Resp 18

## 2021-05-14 DIAGNOSIS — Z8679 Personal history of other diseases of the circulatory system: Secondary | ICD-10-CM

## 2021-05-14 DIAGNOSIS — Z515 Encounter for palliative care: Secondary | ICD-10-CM

## 2021-05-14 DIAGNOSIS — C310 Malignant neoplasm of maxillary sinus: Secondary | ICD-10-CM

## 2021-05-14 DIAGNOSIS — G893 Neoplasm related pain (acute) (chronic): Secondary | ICD-10-CM | POA: Diagnosis not present

## 2021-05-14 DIAGNOSIS — E876 Hypokalemia: Secondary | ICD-10-CM

## 2021-05-14 DIAGNOSIS — E440 Moderate protein-calorie malnutrition: Secondary | ICD-10-CM

## 2021-05-14 DIAGNOSIS — Z5111 Encounter for antineoplastic chemotherapy: Secondary | ICD-10-CM | POA: Diagnosis not present

## 2021-05-14 DIAGNOSIS — Z95828 Presence of other vascular implants and grafts: Secondary | ICD-10-CM

## 2021-05-14 DIAGNOSIS — C760 Malignant neoplasm of head, face and neck: Secondary | ICD-10-CM

## 2021-05-14 DIAGNOSIS — Z5112 Encounter for antineoplastic immunotherapy: Secondary | ICD-10-CM

## 2021-05-14 LAB — CBC WITH DIFFERENTIAL/PLATELET
Abs Immature Granulocytes: 0.07 10*3/uL (ref 0.00–0.07)
Basophils Absolute: 0.1 10*3/uL (ref 0.0–0.1)
Basophils Relative: 2 %
Eosinophils Absolute: 0.5 10*3/uL (ref 0.0–0.5)
Eosinophils Relative: 5 %
HCT: 32.6 % — ABNORMAL LOW (ref 36.0–46.0)
Hemoglobin: 10.6 g/dL — ABNORMAL LOW (ref 12.0–15.0)
Immature Granulocytes: 1 %
Lymphocytes Relative: 6 %
Lymphs Abs: 0.6 10*3/uL — ABNORMAL LOW (ref 0.7–4.0)
MCH: 32.4 pg (ref 26.0–34.0)
MCHC: 32.5 g/dL (ref 30.0–36.0)
MCV: 99.7 fL (ref 80.0–100.0)
Monocytes Absolute: 0.9 10*3/uL (ref 0.1–1.0)
Monocytes Relative: 9 %
Neutro Abs: 7.3 10*3/uL (ref 1.7–7.7)
Neutrophils Relative %: 77 %
Platelets: 523 10*3/uL — ABNORMAL HIGH (ref 150–400)
RBC: 3.27 MIL/uL — ABNORMAL LOW (ref 3.87–5.11)
RDW: 14.9 % (ref 11.5–15.5)
WBC: 9.4 10*3/uL (ref 4.0–10.5)
nRBC: 0 % (ref 0.0–0.2)

## 2021-05-14 LAB — COMPREHENSIVE METABOLIC PANEL
ALT: 16 U/L (ref 0–44)
AST: 21 U/L (ref 15–41)
Albumin: 3.2 g/dL — ABNORMAL LOW (ref 3.5–5.0)
Alkaline Phosphatase: 126 U/L (ref 38–126)
Anion gap: 12 (ref 5–15)
BUN: 9 mg/dL (ref 8–23)
CO2: 24 mmol/L (ref 22–32)
Calcium: 8.8 mg/dL — ABNORMAL LOW (ref 8.9–10.3)
Chloride: 94 mmol/L — ABNORMAL LOW (ref 98–111)
Creatinine, Ser: 0.45 mg/dL (ref 0.44–1.00)
GFR, Estimated: >60 mL/min (ref >60)
Glucose, Bld: 134 mg/dL — ABNORMAL HIGH (ref 70–99)
Potassium: 3.4 mmol/L — ABNORMAL LOW (ref 3.5–5.1)
Sodium: 130 mmol/L — ABNORMAL LOW (ref 135–145)
Total Bilirubin: 0.3 mg/dL (ref 0.3–1.2)
Total Protein: 7.4 g/dL (ref 6.5–8.1)

## 2021-05-14 LAB — TSH: TSH: 9.516 u[IU]/mL — ABNORMAL HIGH (ref 0.350–4.500)

## 2021-05-14 LAB — MAGNESIUM: Magnesium: 1.8 mg/dL (ref 1.7–2.4)

## 2021-05-14 MED ORDER — SODIUM CHLORIDE 0.9% FLUSH
10.0000 mL | Freq: Once | INTRAVENOUS | Status: AC
  Administered 2021-05-14: 10 mL via INTRAVENOUS
  Filled 2021-05-14: qty 10

## 2021-05-14 MED ORDER — SODIUM CHLORIDE 0.9 % IV SOLN
200.0000 mg | Freq: Once | INTRAVENOUS | Status: AC
  Administered 2021-05-14: 200 mg via INTRAVENOUS
  Filled 2021-05-14: qty 8

## 2021-05-14 MED ORDER — HEPARIN SOD (PORK) LOCK FLUSH 100 UNIT/ML IV SOLN
500.0000 [IU] | Freq: Once | INTRAVENOUS | Status: DC | PRN
  Filled 2021-05-14: qty 5

## 2021-05-14 MED ORDER — ALBUTEROL SULFATE HFA 108 (90 BASE) MCG/ACT IN AERS: 2.0000 | INHALATION_SPRAY | Freq: Four times a day (QID) | RESPIRATORY_TRACT | 2 refills | Status: AC | PRN

## 2021-05-14 MED ORDER — SODIUM CHLORIDE 0.9 % IV SOLN
Freq: Once | INTRAVENOUS | Status: AC
  Filled 2021-05-14: qty 250

## 2021-05-14 MED ORDER — FAMOTIDINE 20 MG IN NS 100 ML IVPB
20.0000 mg | Freq: Once | INTRAVENOUS | Status: AC
  Administered 2021-05-14: 20 mg via INTRAVENOUS
  Filled 2021-05-14: qty 20

## 2021-05-14 MED ORDER — HEPARIN SOD (PORK) LOCK FLUSH 100 UNIT/ML IV SOLN
INTRAVENOUS | Status: AC
  Filled 2021-05-14: qty 5

## 2021-05-14 MED ORDER — SODIUM CHLORIDE 0.9 % IV SOLN
10.0000 mg | Freq: Once | INTRAVENOUS | Status: AC
  Administered 2021-05-14: 10 mg via INTRAVENOUS
  Filled 2021-05-14: qty 10

## 2021-05-14 MED ORDER — MONTELUKAST SODIUM 10 MG PO TABS
10.0000 mg | ORAL_TABLET | Freq: Once | ORAL | Status: AC
Start: 2021-05-14 — End: 2021-05-14
  Administered 2021-05-14: 10 mg via ORAL
  Filled 2021-05-14: qty 1

## 2021-05-14 MED ORDER — CETUXIMAB CHEMO IV INJECTION 200 MG/100ML
250.0000 mg/m2 | Freq: Once | INTRAVENOUS | Status: AC
  Administered 2021-05-14: 400 mg via INTRAVENOUS
  Filled 2021-05-14: qty 200

## 2021-05-14 MED ORDER — HEPARIN SOD (PORK) LOCK FLUSH 100 UNIT/ML IV SOLN
500.0000 [IU] | Freq: Once | INTRAVENOUS | Status: AC
  Administered 2021-05-14: 500 [IU] via INTRAVENOUS
  Filled 2021-05-14: qty 5

## 2021-05-14 MED ORDER — DIPHENHYDRAMINE HCL 50 MG/ML IJ SOLN
50.0000 mg | Freq: Once | INTRAMUSCULAR | Status: AC
  Administered 2021-05-14: 50 mg via INTRAVENOUS
  Filled 2021-05-14: qty 1

## 2021-05-14 NOTE — Progress Notes (Signed)
Hematology/Oncology follow up note [facility] [phone] Fax:([phone]   Patient Care Team: Pcp, No as PCP - General Shannon Schroeder, Shannon November, MD as PCP - Cardiology (Cardiology) Earlie Server, MD as Consulting Physician (Oncology)  CHIEF COMPLAINTS/REASON FOR VISIT:  Follow-up for head and neck cancer  HISTORY OF PRESENTING ILLNESS:   Shannon Schroeder is a  65 y.o.  female with PMH listed below was seen in consultation at the request of ER Dr. Caryn Schroeder for evaluation of abnormal CT scan.  07/10/2020 she presented emergency room for evaluation of right-sided facial swelling and pain in the right side of her mouth for the past 6 weeks.  It started in her mouth with a small ulcer which progressively got worse.  She wears denture.  Not able to chew well.  She eats soft food. She was found to have right upper gum also with yellowish discharge noted. 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  Avoid piloting.,  Right orbital apex, also the right soft palate and palate and tonsil.  Furthermore there is evidence of intracranial extension along the right V2 at the foramen rotundum Small suspicious right level 1A lymph node measuring 7 mm.  Separate small indeterminate but suspicious enhancing soft tissue nodule of the left sublingual space.  Patient was referred to oncology for further evaluation management. Patient denies fever, chills, nausea vomiting diarrhea shortness of breath or cough.  Reports 10 out of 10 severe pain of right face and numbness.  She also has headache. Reports unintentional weight loss  She reports no contactable family members.  She is in the process of pointing her roommate Shannon Schroeder to be her power of attorney.  She also wants to record today's conversation for her roommate  Shannon Schroeder. Patient has 50-pack-year smoking history.  # seen by ENT Shannon Schroeder and had a biopsy. Biopsy was sent to Shannon Schroeder and the final pathology was positive for invasive squamous cell carcinoma, well-differentiated, keratinizing. 07/17/2020, brain MRI showed large right facial mass with right maxillary erosion involving the maxillary sinus, hard palate and pterygoid plates and muscles.  Perineural spreading along the multiple branches of the maxillary division of the right trigeminal nerve at the infraorbital canal, pterygopalatine fossa and foramen rotundum. 07/17/2020, PET scan showed Intense FDG uptake is associated with the large enhancing tumor which involves the right maxilla, right nasal cavity, right pterygoid palatine fossa, right orbital apex and right soft tissue palate and palatine tonsil. 2. Mild nonspecific FDG uptake is associated with the recently characterized suspicious right level 1A lymph node which measures 7 mm and has an SUV max of 2.12.  No Schroeder of distant metastasis.  Aortic atherosclerosis.  Patient also reports a history of hypertension, dysrhythmia, history of V. tach.  Patient previously was on lisinopril, atenolol, amlodipine for which she is no longer taking at this point due to lack of medication coverage.  She also does not have any primary care provider. 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  On average she says she is takes 2 to 3 pills/day.  She also takes gabapentin 300 mg 3 times daily  # new onset of atrial fibrillation with rapid ventricular  response.  Patient was started on metoprolol.  Eliquis for anticoagulation.  # 09/06/2020- 09/11/2020 admitted due to A. fib, hypotension, multifocal pneumonia.  Patient was treated with IV antibiotics.  Cardiology saw the patient during admission and  added amiodarone.  Patient was discharged on 5 days course of oral antibiotics, continued on metoprolol and amiodarone for atrial fibrillation.  # Dysphagia, she declined PET tube placment.  #08/08/2020-10/11/2020 concurrent cisplatin and radiation. # 01/09/2021, PET scan showed improvement but persistent hypermetabolic activity associated with the right frontal sinus.Marked improvement in the posterior right nasopharynx with decrease in metabolic activity and tissue thickening.  Persistent but improved hypermetabolic thickened tissue in the anterior and medial right maxillary sinus.  Persistent hypermetabolic activity posterior at this level of pterygoid plate on the RIGHT. Potential new activity in the RIGHT skull base inferior medial along the sphenoid bone  # 01/14/2021, She had recurrent maxillary sinus squamous cell carcinoma and was referred to [facility] for discussion of feasibility of residual disease resection.  Patient stated at [facility] for her oncology care after that. Extensive outside medical records review was performed by me via Care Everywhere.   01/27/21 MRI brain with and without contrast were notable for recurrent disease in the right pterygopalatine fossa, along V2/V3 extending into the cavernous sinus and orbital fissure, as possibleperineural spread.  01/29/2021 patient establish care with Shannon Schroeder ENT Dr. Ihor Schroeder..  Due to the extent of disease, patient was not considered as a surgical candidate.  Shannon Schroeder sent patient to establish care with Shannon Schroeder for further discussion. Patient was recommended to proceed with neoadjuvant chemotherapy  Shannon Schroeder followed by reevaluation of surgical resection.  Patient understands that treatment goal include symptom control and increase chance of disease control and her surgical outcome.  Patient understands that if she does not respond, same treatment will be continued but with palliative intent.   Shannon Schroeder : Cetuximab 400 mg/m2 load then 250  mg/m2 weekly, PACLItaxel 135 mg/m2 weekly,CARBOplatin AUC 2 weekly for 6 weeks Alpha gal was negative   02/06/2021  C1D1  Cetux/Taxol/Carbo, able to finish chemo without complications 02/01/271, C1 D8 Cetux/Taxol/Carbo able to finish chemo without complications 03/3663 Cetux/Taxol/Carbo, she developed hypotension, nausea and flushing.  Patient was evaluated by nurse practitioner.  Was given IV fluid bolus with improvement of BP and the patient was able to finish infusion successfully.   03/04/2021, C1D22, Cetux/Taxol/Carbo Per note, patient developed hypersensitivity to carboplatin. "tolerated Cetuximab and Paclitaxel infusion without sequelae however during her Carbo infusion, she became acutely altered, momentarily unresponsive, acutely desaturated to 70s, then 80s on RA, hypotensive 81/51 with respiratory wheezing appreciated. The infusion was stopped. NS IVF were given along with pepcid & solumedrol. Patient's vital Schroeder slowly responded but not to baseline. Pulse was thready and skin was cold and clammy. She followed some commands, slowly. A rapid response was called for the acute change in mental status and concerning vital Schroeder. Patient was taken to ER for further evaluation and work up"   03/25/2021 C1 D29 Cetux/Taxol/Cisplatin carboplatin was switched to cisplatin Unfortunately patient developed hypersensitivity to cisplatin as well. Patient missed 5/10, 5/17 treatments due to weakness.   04/10/2019 2 repeat MRI brain with and without contrast showed evidence of disease response, however continues to have CN V involvement with extension to the skull base, unlikely to be a surgical candidate.  Her case was noted to be discussed on the head and neck tumor board at [facility]. Patient was recommended to switch to [facility] with pembrolizumab and  cetuximab given the intolerance of cis-platinum  INTERVAL HISTORY Shannon [facility]ro[facility] is a 65 y.o. female who has above history reviewed by me today presents for  follow up visit for management of head and neck cancer,  Patient tolerates pured food, takes nutrition supplements. Weight is stable.  Neoplasm induced pain is better controlled with current pain Schroeder.     Review of Systems  Constitutional:  Positive for fatigue. Negative for appetite change, chills, fever and unexpected weight change.  HENT:   Negative for hearing loss, mouth sores and voice change.        Facial swelling, pain   Eyes:  Negative for eye problems.  Respiratory:  Negative for chest tightness, cough and shortness of breath.   Cardiovascular:  Negative for chest pain and palpitations.  Gastrointestinal:  Negative for abdominal distention, abdominal pain, blood in stool, constipation, nausea and vomiting.       Dysphagia  Endocrine: Negative for hot flashes.  Genitourinary:  Negative for difficulty urinating and frequency.   Musculoskeletal:  Negative for arthralgias.  Skin:  Negative for itching and rash.  Neurological:  Negative for extremity weakness and headaches.  Hematological:  Negative for adenopathy.  Psychiatric/Behavioral:  Negative for confusion.    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    Anxiety    Arthritis    Bipolar disorder (HCC)    COPD (chronic obstructive pulmonary disease) (HCC)    NO INHALERS   Depression    Dysrhythmia    H/O V TACH   Head and neck cancer ([place]) 07/29/2020   Headache    H/O MIGRAINES   Hypertension    Seizures ([place])    X1 AFTER FALL    SURGICAL HISTORY: Past Surgical History:  Procedure Laterality Date   ABDOMINAL HYSTERECTOMY     partial   BREAST SURGERY     biopsy   CARDIAC CATHETERIZATION     X 2   CARPAL TUNNEL RELEASE Right    CARPECTOMY HAND Right    CATARACT EXTRACTION W/PHACO Left 03/08/2018   Procedure: CATARACT EXTRACTION PHACO AND INTRAOCULAR LENS PLACEMENT ([place]);  Surgeon: Birder Robson,  MD;  Location: [facility] ORS;  Service: Ophthalmology;  Laterality: Left;  [place] 00:55 AP% 13.5 CDE 7.52 Fluid pack lot # [id number]   CATARACT EXTRACTION W/PHACO Right 04/05/2018   Procedure: CATARACT EXTRACTION PHACO AND INTRAOCULAR LENS PLACEMENT (IOC);  Surgeon: Birder Robson, MD;  Location: [facility] ORS;  Service: Ophthalmology;  Laterality: Right;  [place] 00:36 AP% 15.9 CDE 5.72 Fluid pack lot # [id number]   CEREBRAL ANEURYSM REPAIR     CHOLECYSTECTOMY     COLONOSCOPY WITH PROPOFOL N/A 04/26/2019   Procedure: COLONOSCOPY WITH PROPOFOL;  Surgeon: Jonathon Bellows, MD;  Location: [facility];  Service: Gastroenterology;  Laterality: N/A;   PORTACATH PLACEMENT Left 08/05/2020   Procedure: INSERTION PORT-A-CATH;  Surgeon: Herbert Pun, MD;  Location: [facility] ORS;  Service: General;  Laterality: Left;   TUBAL LIGATION      SOCIAL HISTORY: Social History   Socioeconomic History   Marital status: Divorced    Spouse name: Not on file   Number of children: Not on file   Years of education: 16   Highest education level: Bachelor's degree (e.g., BA, AB, BS)  Occupational History   Occupation: farmer  Tobacco Use   Smoking status: Every Day    Packs/day: 0.25    Years: 50.00  Pack years: 12.50    Types: Cigarettes   Smokeless tobacco: Never  Vaping Use   Vaping Use: Never used  Substance and Sexual Activity   Alcohol use: No   Drug use: No   Sexual activity: Not Currently  Other Topics Concern   Not on file  Social History Narrative   Pt lives at [facility].    Social Determinants of Health   Financial Resource Strain: Not on file  Food Insecurity: Not on file  Transportation Needs: Not on file  Physical Activity: Not on file  Stress: Not on file  Social Connections: Not on file  Intimate Partner Violence: Not on file    FAMILY HISTORY: Family History  Problem Relation Age of Onset   Hypertension Mother    Heart failure Mother    Hypertension Brother    Stroke  Brother    Hypertension Son    Emphysema Maternal Aunt    Hypertension Paternal Aunt    Hypertension Paternal Uncle    Hypertension Maternal Grandmother    Hypertension Paternal Grandmother     ALLERGIES:  is allergic to naproxen, aspirin, belladonna alkaloids, fluoxetine, fluoxetine hcl, paroxetine hcl, phenobarbital, and prozac [fluoxetine hcl].  MEDICATIONS:  Current Outpatient Medications  Medication Sig Dispense Refill   amiodarone (PACERONE) 200 MG tablet Take 1 tablet (200 mg total) by mouth daily. 90 tablet 1   apixaban (ELIQUIS) 5 MG TABS tablet Take 1 tablet (5 mg total) by mouth 2 (two) times daily. 180 tablet 3   docusate sodium (COLACE) 100 MG capsule Take 1 capsule (100 mg total) by mouth daily. 30 capsule 1   feeding supplement, ENSURE ENLIVE, (ENSURE ENLIVE) LIQD Take 237 mLs by mouth 3 (three) times daily between meals. 237 mL 12   fentaNYL (DURAGESIC) 50 MCG/HR Place 1 patch onto the skin every 3 (three) days. 10 patch 0   gabapentin (NEURONTIN) 300 MG capsule Take 2 capsules (600 mg total) by mouth 3 (three) times daily. 180 capsule 1   magnesium chloride (SLOW-MAG) 64 MG TBEC SR tablet Take 1 tablet (64 mg total) by mouth 2 (two) times daily. 60 tablet 1   Melatonin 10 MG TABS Take 10 mg by mouth at bedtime as needed (sleep).     ondansetron (ZOFRAN-ODT) 8 MG disintegrating tablet Take 1 tablet (8 mg total) by mouth every 8 (eight) hours as needed for nausea or vomiting. 90 tablet 0   oxyCODONE-acetaminophen (PERCOCET) 10-325 MG tablet Take 1 tablet by mouth every 6 (six) hours as needed for pain. 60 tablet 0   potassium chloride SA (KLOR-CON) 20 MEQ tablet Take 2 tablets (40 mEq total) by mouth daily. 30 tablet 1   albuterol (VENTOLIN HFA) 108 (90 Base) MCG/ACT inhaler Inhale 2 puffs into the lungs every 6 (six) hours as needed for wheezing or shortness of breath. 8 g 2   furosemide (LASIX) 20 MG tablet Take 1 tablet (20 mg total) by mouth daily as needed (for lower  extremity swelling). (Patient not taking: No sig reported) 90 tablet 1   mirtazapine (REMERON) 15 MG tablet Take 1 tablet (15 mg total) by mouth at bedtime. (Patient not taking: No sig reported) 30 tablet 0   Multiple Vitamins-Minerals (CENTRUM SILVER PO) Take 1 tablet by mouth daily. Gummie (Patient not taking: No sig reported)     polyethylene glycol powder (GLYCOLAX/MIRALAX) 17 GM/SCOOP powder Take 17 g by mouth daily as needed for mild constipation or moderate constipation. (Patient not taking: No sig reported) 255  g 0   No current facility-administered medications for this visit.   Facility-Administered Medications Ordered in Other Visits  Medication Dose Route Frequency Provider Last Rate Last Admin   0.9 %  sodium chloride infusion   Intravenous Once Earlie Server, MD       cetuximab (ERBITUX) chemo infusion 400 mg  250 mg/m2 (Treatment Plan Recorded) Intravenous Once Earlie Server, MD       dexamethasone (DECADRON) 10 mg in sodium chloride 0.9 % 50 mL IVPB  10 mg Intravenous Once Earlie Server, MD       diphenhydrAMINE (BENADRYL) injection 50 mg  50 mg Intravenous Once Earlie Server, MD       famotidine (PEPCID) IVPB 20 mg in NS 100 mL IVPB  20 mg Intravenous Once Earlie Server, MD       heparin lock flush 100 unit/mL  500 Units Intravenous Once Earlie Server, MD       heparin lock flush 100 unit/mL  500 Units Intracatheter Once PRN Earlie Server, MD       montelukast (SINGULAIR) tablet 10 mg  10 mg Oral Once Earlie Server, MD       pembrolizumab [facility]) 200 mg in sodium chloride 0.9 % 50 mL chemo infusion  200 mg Intravenous Once Earlie Server, MD       sodium chloride flush (NS) 0.9 % injection 10 mL  10 mL Intravenous PRN Earlie Server, MD   10 mL at 10/10/20 1040   sodium chloride flush (NS) 0.9 % injection 10 mL  10 mL Intravenous PRN Earlie Server, MD   10 mL at 11/13/20 0836   sodium chloride flush (NS) 0.9 % injection 10 mL  10 mL Intravenous PRN Earlie Server, MD   10 mL at 12/30/20 0838   sodium chloride flush (NS) 0.9 % injection 10  mL  10 mL Intravenous PRN Earlie Server, MD   10 mL at 01/14/21 0921     PHYSICAL EXAMINATION: ECOG PERFORMANCE STATUS: 1 - Symptomatic but completely ambulatory Vitals:   05/14/21 0842  BP: 128/85  Pulse: 90  Resp: 17  Temp: (!) 97 F (36.1 C)  SpO2: 98%   Filed Weights   05/14/21 0842  Weight: 112 lb 14.4 oz (51.2 kg)    Physical Exam Constitutional:      General: She is not in acute distress.    Comments: Thin built.  HENT:     Head: Normocephalic and atraumatic.     Mouth/Throat:     Comments: Patient wears dentures She has right palate deficiency, with chronic yellowish discharge covering her maxillary sinus surface.  Eyes:     General: No scleral icterus.    Comments: Right eyelid ptosis  Cardiovascular:     Rate and Rhythm: Normal rate and regular rhythm.     Heart sounds: Normal heart sounds.  Pulmonary:     Effort: Pulmonary effort is normal. No respiratory distress.     Breath sounds: No wheezing.  Abdominal:     General: Bowel sounds are normal. There is no distension.     Palpations: Abdomen is soft.  Musculoskeletal:        General: No deformity. Normal range of motion.     Cervical back: Normal range of motion and neck supple.  Skin:    General: Skin is warm and dry.     Findings: No erythema or rash.  Neurological:     Mental Status: She is alert and oriented to person, place, and time. Mental status  is at baseline.     Cranial Nerves: No cranial nerve deficit.     Coordination: Coordination normal.  Psychiatric:        Mood and Affect: Mood normal.    LABORATORY DATA:  I have reviewed the data as listed Lab Results  Component Value Date   WBC 9.4 05/14/2021   HGB 10.6 (L) 05/14/2021   HCT 32.6 (L) 05/14/2021   MCV 99.7 05/14/2021   PLT 523 (H) 05/14/2021   Recent Labs    07/10/20 1311 07/12/20 1204 08/08/20 0821 04/14/21 1317 04/23/21 0810 04/30/21 0834 05/07/21 1016 05/14/21 0824  NA 136 137   < > 129*   < > 129* 130* 130*  K  2.9* 4.0   < > 4.5   < > 3.3* 3.3* 3.4*  CL 97* 98   < > 92*   < > 94* 94* 94*  CO2 28 27   < > 29   < > 24 26 24   GLUCOSE 100* 114*   < > 106*   < > 108* 140* 134*  BUN 13 8   < > 19   < > 10 10 9   CREATININE 0.43* 0.42*   < > 0.46   < > 0.43* 0.41* 0.45  CALCIUM 8.9 9.0   < > 9.0   < > 8.5* 8.7* 8.8*  GFRNONAA >60 >60   < > >60   < > >60 >60 >60  GFRAA >60 >60  --   --   --   --   --   --   PROT 7.0  --    < > 7.1   < > 7.0 7.3 7.4  ALBUMIN 3.7  --    < > 3.1*   < > 3.0* 2.9* 3.2*  AST 20  --    < > 15   < > 19 23 21   ALT 16  --    < > 10   < > 16 18 16   ALKPHOS 106  --    < > 115   < > 108 138* 126  BILITOT 0.5  --    < > <0.1*   < > 0.4 0.2* 0.3  BILIDIR  --   --   --  <0.1  --   --   --   --   IBILI  --   --   --  NOT CALCULATED  --   --   --   --    < > = values in this interval not displayed.    Iron/TIBC/Ferritin/ %Sat    Component Value Date/Time   IRON 46 09/09/2020 0938   IRON 37 05/03/2018 1826   TIBC 210 (L) 09/09/2020 0938   FERRITIN 136 09/09/2020 0938   FERRITIN 52 05/03/2018 1826   IRONPCTSAT 22 09/09/2020 0938      RADIOGRAPHIC STUDIES: I have personally reviewed the radiological images as listed and agreed with the findings in the report. CT Abdomen Wo Contrast  Result Date: 05/02/2021 CLINICAL DATA:  Head and neck carcinoma. Evaluation for percutaneous gastrostomy tube placement. EXAM: CT ABDOMEN WITHOUT CONTRAST TECHNIQUE: Multidetector CT imaging of the abdomen was performed following the standard protocol without IV contrast. COMPARISON:  None. FINDINGS: Lower chest: No acute findings. Hepatobiliary: No masses visualized on this unenhanced exam. Prior cholecystectomy. No evidence of biliary obstruction. Pancreas: No mass or inflammatory process visualized on this unenhanced exam. Spleen:  Within normal limits in size. Adrenals/Urinary tract: Unremarkable. No evidence  of nephrolithiasis or hydronephrosis. Stomach/Bowel: Visualized portion unremarkable.  Vascular/Lymphatic: No pathologically enlarged lymph nodes identified. No evidence of abdominal aortic aneurysm. Aortic atherosclerotic calcification noted. Other:  None. Musculoskeletal: Fracture of the anterior-superior endplate of the L1 vertebral body is seen, which is acute to subacute in age. No suspicious lytic or sclerotic bone lesions identified. IMPRESSION: No evidence of metastatic disease. Mild L1 vertebral body fracture, which is acute to subacute in age. Aortic Atherosclerosis (ICD10-I70.0). Electronically Signed   By: Marlaine Hind M.D.   On: 05/02/2021 14:43       ASSESSMENT & PLAN:  1. 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[place])   2. Moderate protein-calorie malnutrition ([place])   3. History of atrial fibrillation   4. Encounter for antineoplastic immunotherapy   5. Neoplasm related pain   6. Hypokalemia   7. Hypomagnesemia    #maxillary sinus squamous cell carcinoma, s/p concurrent chemotherapy and radiation-recurrence-Unresectable  Labs are reviewed and discussed with patient. Proceed with cycle 2 pembrolizumab and cetuximab  #Malnutrition, dysphagia She appears having better oral intake and has gained weight.  We will hold off tube placement at this point.  Continue nutrition supplementation.  #Right facial pain /swelling neoplasm related pain. Continue fentanyl patch to 50 MCG/h Q72 hours  Continue gabapentin 600 mg three times daily, other options may try carbamazepine.  re-establish care with palliative care service for further management.  Consider referral to pain management for nerve block.  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 Continue  Magic mouthwash as needed.  #Hyponatremia, either due to SIADH secondary to malignancy versus due to decreased oral intake. Na level is stable 130 Liberate dietary salt intake.   #Hypokalemia, continue  potassium chloride 80meq daily.   # Hypomagnesia, magnesium is stable.  continue Slow-Mag 1 tablet daily.    #Atrial  fibrillation on amiodarone and metoprolol.Eliquis  All questions were answered. The patient knows to call the clinic with any problems questions or concerns.   Return of visit:   1 week for lab md cetuximab  Earlie Server, MD, PhD Hematology Oncology [facility] Pager- [phone] 05/14/2021

## 2021-05-14 NOTE — Progress Notes (Signed)
Nutrition Follow-up:    Patient with head and neck cancer.  Completed treatment on 12/102021.  Patient with recurrence and was being treated at [facility]  Patient resuming care locally.  Patient on pembrolizumab and cetuximab.  PEG placement on hold per MD and pt  Met with patient during infusion.  Patient reports that appetite is good and continues puree foods.  Drinking ensure plus 4 times per day.  Reports roommate pureed steak and potato for birthday recently.      Medications: reviewed  Labs: reviewed  Anthropometrics:   Weight 112 lb 14.4 oz today (stable)  111 lb on 6/29 112 lb on 6/22 108 lb on 6/13 111 lb on 4/7 111 lb on 3/15 105 lb on 2/2 111 lb on 1/24  NUTRITION DIAGNOSIS: Inadequate oral intake improving    INTERVENTION:  PEG placement on hold. Continue ensure plus at least 4 times per day Continue high calorie, high protein puree foods for weight maintenance    MONITORING, EVALUATION, GOAL: weight trends, intake   NEXT VISIT: ~ 3 weeks   Endrit Gittins B. Zenia Resides, [place], [place] Registered Dietitian [phone] (mobile)

## 2021-05-14 NOTE — Progress Notes (Signed)
[place]  [phone] [phone]   Name: Shannon Schroeder Date: 05/14/2021 MRN: [id number]  DOB: [birth year]  Patient Care Team: Pcp, No as PCP - General Rockey Situ Kathlene November, MD as PCP - Cardiology (Cardiology) Earlie Server, MD as Consulting Physician (Oncology)    REASON FOR CONSULTATION: Shannon Schroeder is a 65 y.o. 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  MRI of the brain on 07/17/2020 revealed large right facial mass with right maxillary erosion and perineural spread along multiple branches of the maxillary division and right trigeminal nerve.  Patient was referred to palliative care to help address goals and manage ongoing symptoms such as pain.  SOCIAL HISTORY:     reports that she has been smoking cigarettes. She has a 12.50 pack-year smoking history. She has never used smokeless tobacco. She reports that she does not drink alcohol and does not use drugs.   Patient is unmarried.  She has children from whom she is estranged.  She lives with a roommate and feels like she has good social support.  She worked at [employer] in [employer].  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    Anxiety    Arthritis    Bipolar disorder (HCC)    COPD (chronic obstructive pulmonary disease) (HCC)    NO INHALERS   Depression    Dysrhythmia    H/O V TACH   Head and neck cancer ([place]) 07/29/2020   Headache    H/O MIGRAINES   Hypertension    Seizures ([place])    X1 AFTER FALL    PAST SURGICAL HISTORY:  Past Surgical History:  Procedure  Laterality Date   ABDOMINAL HYSTERECTOMY     partial   BREAST SURGERY     biopsy   CARDIAC CATHETERIZATION     X 2   CARPAL TUNNEL RELEASE Right    CARPECTOMY HAND Right    CATARACT EXTRACTION W/PHACO Left 03/08/2018   Procedure: CATARACT EXTRACTION PHACO AND INTRAOCULAR LENS PLACEMENT ([place]);  Surgeon: Birder Robson, MD;  Location: [facility] ORS;  Service: Ophthalmology;  Laterality: Left;  [place] 00:55 AP% 13.5 CDE 7.52 Fluid pack lot # [id number]   CATARACT EXTRACTION W/PHACO Right 04/05/2018   Procedure: CATARACT EXTRACTION PHACO AND INTRAOCULAR LENS PLACEMENT (IOC);  Surgeon: Birder Robson, MD;  Location: [facility] ORS;  Service: Ophthalmology;  Laterality: Right;  [place] 00:36 AP% 15.9 CDE 5.72 Fluid pack lot # [id number]   CEREBRAL ANEURYSM REPAIR     CHOLECYSTECTOMY     COLONOSCOPY WITH PROPOFOL N/A 04/26/2019   Procedure: COLONOSCOPY WITH PROPOFOL;  Surgeon: Jonathon Bellows, MD;  Location: [facility] ENDOSCOPY;  Service: Gastroenterology;  Laterality: N/A;   PORTACATH PLACEMENT Left 08/05/2020   Procedure: INSERTION PORT-A-CATH;  Surgeon: Herbert Pun, MD;  Location: [facility] ORS;  Service: General;  Laterality: Left;   TUBAL LIGATION      HEMATOLOGY/ONCOLOGY HISTORY:  Oncology History  Head and neck cancer ([place])  07/29/2020 Initial Diagnosis   Head and neck cancer ([place])   08/08/2020 - 08/08/2020 Chemotherapy   The patient had dexamethasone (DECADRON) 4 MG tablet, 8 mg,  Oral, Daily, 0 of 1 cycle, Start date: --, End date: -- palonosetron (ALOXI) injection 0.25 mg, 0.25 mg, Intravenous,  Once, 0 of 3 cycles CISplatin (PLATINOL) 166 mg in sodium chloride 0.9 % 500 mL chemo infusion, 100 mg/m2, Intravenous,  Once, 0 of 3 cycles fosaprepitant (EMEND) 150 mg in sodium chloride 0.9 % 145 mL IVPB, 150 mg, Intravenous,  Once, 0 of 3 cycles  for chemotherapy treatment.    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  MEDICATIONS:  Current Outpatient Medications  Medication Sig Dispense Refill   albuterol (VENTOLIN HFA) 108 (90 Base) MCG/ACT inhaler Inhale 2 puffs into the lungs every 6 (six) hours as needed for wheezing or shortness of breath. 8 g 2   amiodarone (PACERONE) 200 MG tablet Take 1 tablet (200 mg total) by mouth daily. 90 tablet 1   apixaban (ELIQUIS) 5 MG TABS tablet Take 1 tablet (5 mg total) by mouth 2 (two) times daily. 180 tablet 3   docusate sodium (COLACE) 100 MG capsule Take 1 capsule (100 mg total) by mouth daily. 30 capsule 1   feeding supplement, ENSURE ENLIVE, (ENSURE ENLIVE) LIQD Take 237 mLs by mouth 3 (three) times daily between meals. 237 mL 12   fentaNYL (DURAGESIC) 50 MCG/HR Place 1 patch onto the skin every 3 (three) days. 10 patch 0   furosemide (LASIX) 20 MG tablet Take 1 tablet (20 mg total) by mouth daily as needed (for lower extremity swelling). (Patient not taking: No sig reported) 90 tablet 1   gabapentin (NEURONTIN) 300 MG capsule Take 2 capsules (600 mg total) by mouth 3 (three) times daily. 180 capsule 1   magnesium chloride (SLOW-MAG) 64 MG TBEC SR tablet Take 1 tablet (64 mg total) by mouth 2 (two) times daily. 60 tablet 1   Melatonin 10 MG TABS Take 10 mg by mouth at bedtime as needed (sleep).     mirtazapine (REMERON) 15 MG tablet Take 1 tablet (15 mg total) by mouth at bedtime. (Patient not taking: No sig reported) 30 tablet 0   Multiple Vitamins-Minerals (CENTRUM SILVER PO) Take 1 tablet by mouth daily. Gummie (Patient not taking: No sig  reported)     ondansetron (ZOFRAN-ODT) 8 MG disintegrating tablet Take 1 tablet (8 mg total) by mouth every 8 (eight) hours as needed for nausea or vomiting. 90 tablet 0   oxyCODONE-acetaminophen (PERCOCET) 10-325 MG tablet Take 1 tablet by mouth every 6 (six) hours as needed for pain. 60 tablet 0   polyethylene glycol powder (GLYCOLAX/MIRALAX) 17 GM/SCOOP powder Take 17 g by mouth daily as needed for mild constipation or moderate constipation. (Patient not taking: No sig reported) 255 g 0   potassium chloride SA (KLOR-CON) 20 MEQ tablet Take 2 tablets (40 mEq total) by mouth daily. 30 tablet 1   No current facility-administered medications for this visit.   Facility-Administered Medications Ordered in Other Visits  Medication Dose Route Frequency Provider Last Rate Last Admin  cetuximab (ERBITUX) chemo infusion 400 mg  250 mg/m2 (Treatment Plan Recorded) Intravenous Once Earlie Server, MD 200 mL/hr at 05/14/21 1130 400 mg at 05/14/21 1130   heparin lock flush 100 unit/mL  500 Units Intravenous Once Earlie Server, MD       heparin lock flush 100 unit/mL  500 Units Intracatheter Once PRN Earlie Server, MD       sodium chloride flush (NS) 0.9 % injection 10 mL  10 mL Intravenous PRN Earlie Server, MD   10 mL at 10/10/20 1040   sodium chloride flush (NS) 0.9 % injection 10 mL  10 mL Intravenous PRN Earlie Server, MD   10 mL at 11/13/20 0836   sodium chloride flush (NS) 0.9 % injection 10 mL  10 mL Intravenous PRN Earlie Server, MD   10 mL at 12/30/20 1751   sodium chloride flush (NS) 0.9 % injection 10 mL  10 mL Intravenous PRN Earlie Server, MD   10 mL at 01/14/21 0258    VITAL SIGNS: LMP 11/19/1988 (Exact Date)  There were no vitals filed for this visit.  Estimated body mass index is 18.22 kg/m as calculated from the following:   Height as of 11/25/20: 5\' 6"  (1.676 m).   Weight as of an earlier encounter on 05/14/21: 112 lb 14.4 oz (51.2 kg).  LABS: CBC:    Component Value Date/Time   WBC 9.4 05/14/2021 0824   HGB 10.6  (L) 05/14/2021 0824   HGB 13.8 03/15/2019 1201   HCT 32.6 (L) 05/14/2021 0824   HCT 39.0 03/15/2019 1201   HCT 36 12/18/2016 0000   PLT 523 (H) 05/14/2021 0824   PLT 325 03/15/2019 1201   MCV 99.7 05/14/2021 0824   MCV 96 03/15/2019 1201   MCV 98 12/18/2016 0000   NEUTROABS 7.3 05/14/2021 0824   NEUTROABS 2.4 05/03/2018 1826   LYMPHSABS 0.6 (L) 05/14/2021 0824   LYMPHSABS 1.6 05/03/2018 1826   MONOABS 0.9 05/14/2021 0824   EOSABS 0.5 05/14/2021 0824   EOSABS 0.2 05/03/2018 1826   BASOSABS 0.1 05/14/2021 0824   BASOSABS 0.1 05/03/2018 1826   BASOSABS 2 12/18/2016 0000   Comprehensive Metabolic Panel:    Component Value Date/Time   NA 130 (L) 05/14/2021 0824   NA 135 03/15/2019 1201   K 3.4 (L) 05/14/2021 0824   K 4.2 06/26/2015 0000   CL 94 (L) 05/14/2021 0824   CL 91 05/25/2016 0000   CO2 24 05/14/2021 0824   CO2 25 08/19/2016 0000   BUN 9 05/14/2021 0824   BUN 12 03/15/2019 1201   BUN 11 12/18/2016 0000   CREATININE 0.45 05/14/2021 0824   CREATININE 0.67 12/18/2016 0000   GLUCOSE 134 (H) 05/14/2021 0824   CALCIUM 8.8 (L) 05/14/2021 0824   CALCIUM 8.9 12/18/2016 0000   AST 21 05/14/2021 0824   AST 15 12/18/2016 0000   ALT 16 05/14/2021 0824   ALT 11 08/19/2016 0000   ALKPHOS 126 05/14/2021 0824   ALKPHOS 117 12/18/2016 0000   BILITOT 0.3 05/14/2021 0824   BILITOT <0.2 03/15/2019 1201   BILITOT 0.3 12/18/2016 0000   PROT 7.4 05/14/2021 0824   PROT 6.8 03/15/2019 1201   ALBUMIN 3.2 (L) 05/14/2021 0824   ALBUMIN 4.5 03/15/2019 1201   ALBUMIN 4.5 12/18/2016 0000    RADIOGRAPHIC STUDIES: CT Abdomen Wo Contrast  Result Date: 05/02/2021 CLINICAL DATA:  Head and neck carcinoma. Evaluation for percutaneous gastrostomy tube placement. EXAM: CT ABDOMEN WITHOUT CONTRAST TECHNIQUE: Multidetector CT imaging of  the abdomen was performed following the standard protocol without IV contrast. COMPARISON:  None. FINDINGS: Lower chest: No acute findings. Hepatobiliary: No masses  visualized on this unenhanced exam. Prior cholecystectomy. No evidence of biliary obstruction. Pancreas: No mass or inflammatory process visualized on this unenhanced exam. Spleen:  Within normal limits in size. Adrenals/Urinary tract: Unremarkable. No evidence of nephrolithiasis or hydronephrosis. Stomach/Bowel: Visualized portion unremarkable. Vascular/Lymphatic: No pathologically enlarged lymph nodes identified. No evidence of abdominal aortic aneurysm. Aortic atherosclerotic calcification noted. Other:  None. Musculoskeletal: Fracture of the anterior-superior endplate of the L1 vertebral body is seen, which is acute to subacute in age. No suspicious lytic or sclerotic bone lesions identified. IMPRESSION: No evidence of metastatic disease. Mild L1 vertebral body fracture, which is acute to subacute in age. Aortic Atherosclerosis (ICD10-I70.0). Electronically Signed   By: Marlaine Hind M.D.   On: 05/02/2021 14:43     PERFORMANCE STATUS (ECOG) : 1 - Symptomatic but completely ambulatory  Review of Systems Unless otherwise noted, a complete review of systems is negative.  Physical Exam General: NAD Pulmonary: Unlabored Extremities: no edema, no joint deformities Skin: no rashes Neurological: Weakness but otherwise nonfocal  IMPRESSION: Patient was referred back to palliative care due to worsening trigeminal neuralgia.  Patient has history of chronic radiating facial pain primarily on the right side of her face.  However, patient reports that pain is significantly improved after Dr. Tasia Catchings increase her transdermal fentanyl to 50 mcg and the gabapentin to 600 mg 3 times daily.  Discussed options with patient today for future pain management strategies.  Given trigeminal neuralgia, would recommend trial of Tegretol if needed.  Patient reports that she has previously been on Tegretol for her bipolar and tolerated it well.  Patient denies other distressing symptoms today.  She continues to live at home  with a roommate who provides her with supportive care and assistance with ADLs as needed.  PLAN: -Continue current scope of treatment -Continue gabapentin 600 mg 3 times daily -Continue transdermal fentanyl 50 mcg every 72 hours and Percocet as needed for breakthrough pain -Would recommend trial of carbamazepine if needed -Referrals for rehab screening and ST -ACP/MOST form previously reviewed -Follow-up MyChart visit 2 to 3 weeks   Patient expressed understanding and was in agreement with this plan. She also understands that She can call the clinic at any time with any questions, concerns, or complaints.     Time Total: 15 minutes  Visit consisted of counseling and education dealing with the complex and emotionally intense issues of symptom management and palliative care in the setting of serious and potentially life-threatening illness.Greater than 50%  of this time was spent counseling and coordinating care related to the above assessment and plan.  Signed by: Altha Harm, PhD, NP-C

## 2021-05-14 NOTE — Patient Instructions (Signed)
[facility] ONCOLOGY  Discharge Instructions: Thank you for choosing [facility] to provide your oncology and hematology care.  If you have a lab appointment with the [facility], please go directly to the [facility] and check in at the registration area.  Wear comfortable clothing and clothing appropriate for easy access to any Portacath or PICC line.   We strive to give you quality time with your provider. You may need to reschedule your appointment if you arrive late (15 or more minutes).  Arriving late affects you and other patients whose appointments are after yours.  Also, if you miss three or more appointments without notifying the office, you may be dismissed from the clinic at the provider's discretion.      For prescription refill requests, have your pharmacy contact our office and allow 72 hours for refills to be completed.    Today you received the following chemotherapy and/or immunotherapy agents Keytruda & Erbitux      To help prevent nausea and vomiting after your treatment, we encourage you to take your nausea medication as directed.  BELOW ARE SYMPTOMS THAT SHOULD BE REPORTED IMMEDIATELY: *FEVER GREATER THAN 100.4 F (38 C) OR HIGHER *CHILLS OR SWEATING *NAUSEA AND VOMITING THAT IS NOT CONTROLLED WITH YOUR NAUSEA MEDICATION *UNUSUAL SHORTNESS OF BREATH *UNUSUAL BRUISING OR BLEEDING *URINARY PROBLEMS (pain or burning when urinating, or frequent urination) *BOWEL PROBLEMS (unusual diarrhea, constipation, pain near the anus) TENDERNESS IN MOUTH AND THROAT WITH OR WITHOUT PRESENCE OF ULCERS (sore throat, sores in mouth, or a toothache) UNUSUAL RASH, SWELLING OR PAIN  UNUSUAL VAGINAL DISCHARGE OR ITCHING   Items with * indicate a potential emergency and should be followed up as soon as possible or go to the Emergency Department if any problems should occur.  Please show the CHEMOTHERAPY ALERT CARD or IMMUNOTHERAPY ALERT CARD at  check-in to the Emergency Department and triage nurse.  Should you have questions after your visit or need to cancel or reschedule your appointment, please contact [facility]  [phone] and follow the prompts.  Office hours are 8:00 a.m. to 4:30 p.m. Monday - Friday. Please note that voicemails left after 4:00 p.m. may not be returned until the following business day.  We are closed weekends and major holidays. You have access to a nurse at all times for urgent questions. Please call the main number to the clinic [phone] and follow the prompts.  For any non-urgent questions, you may also contact your provider using MyChart. We now offer e-Visits for anyone 43 and older to request care online for non-urgent symptoms. For details visit [email or web address].   Also download the MyChart app! Go to the app store, search "MyChart", open the app, select , and log in with your MyChart username and password.  Due to Covid, a mask is required upon entering the hospital/clinic. If you do not have a mask, one will be given to you upon arrival. For doctor visits, patients may have 1 support person aged 34 or older with them. For treatment visits, patients cannot have anyone with them due to current Covid guidelines and our immunocompromised population.

## 2021-05-14 NOTE — Progress Notes (Signed)
Patient here for oncology follow-up appointment, expresses concerns of chronic nausea and cough

## 2021-05-15 LAB — T4: T4, Total: 8.9 ug/dL (ref 4.5–12.0)

## 2021-05-21 ENCOUNTER — Encounter: Payer: Self-pay | Admitting: Oncology

## 2021-05-21 ENCOUNTER — Inpatient Hospital Stay: Payer: Medicare Other

## 2021-05-21 ENCOUNTER — Other Ambulatory Visit: Payer: Self-pay

## 2021-05-21 ENCOUNTER — Inpatient Hospital Stay (HOSPITAL_BASED_OUTPATIENT_CLINIC_OR_DEPARTMENT_OTHER): Payer: Medicare Other | Admitting: Oncology

## 2021-05-21 ENCOUNTER — Other Ambulatory Visit: Payer: Self-pay | Admitting: Oncology

## 2021-05-21 VITALS — BP 129/80 | HR 99 | Temp 98.0°F | Resp 18 | Wt 113.7 lb

## 2021-05-21 DIAGNOSIS — Z8679 Personal history of other diseases of the circulatory system: Secondary | ICD-10-CM

## 2021-05-21 DIAGNOSIS — E440 Moderate protein-calorie malnutrition: Secondary | ICD-10-CM | POA: Diagnosis not present

## 2021-05-21 DIAGNOSIS — Z5111 Encounter for antineoplastic chemotherapy: Secondary | ICD-10-CM

## 2021-05-21 DIAGNOSIS — G893 Neoplasm related pain (acute) (chronic): Secondary | ICD-10-CM | POA: Diagnosis not present

## 2021-05-21 DIAGNOSIS — C310 Malignant neoplasm of maxillary sinus: Secondary | ICD-10-CM | POA: Diagnosis not present

## 2021-05-21 DIAGNOSIS — E876 Hypokalemia: Secondary | ICD-10-CM | POA: Diagnosis not present

## 2021-05-21 DIAGNOSIS — C760 Malignant neoplasm of head, face and neck: Secondary | ICD-10-CM

## 2021-05-21 LAB — CBC WITH DIFFERENTIAL/PLATELET
Abs Immature Granulocytes: 0.04 10*3/uL (ref 0.00–0.07)
Basophils Absolute: 0.1 10*3/uL (ref 0.0–0.1)
Basophils Relative: 1 %
Eosinophils Absolute: 0.5 10*3/uL (ref 0.0–0.5)
Eosinophils Relative: 7 %
HCT: 35.3 % — ABNORMAL LOW (ref 36.0–46.0)
Hemoglobin: 11.2 g/dL — ABNORMAL LOW (ref 12.0–15.0)
Immature Granulocytes: 1 %
Lymphocytes Relative: 12 %
Lymphs Abs: 0.9 10*3/uL (ref 0.7–4.0)
MCH: 31.5 pg (ref 26.0–34.0)
MCHC: 31.7 g/dL (ref 30.0–36.0)
MCV: 99.4 fL (ref 80.0–100.0)
Monocytes Absolute: 0.9 10*3/uL (ref 0.1–1.0)
Monocytes Relative: 12 %
Neutro Abs: 4.8 10*3/uL (ref 1.7–7.7)
Neutrophils Relative %: 67 %
Platelets: 516 10*3/uL — ABNORMAL HIGH (ref 150–400)
RBC: 3.55 MIL/uL — ABNORMAL LOW (ref 3.87–5.11)
RDW: 15 % (ref 11.5–15.5)
WBC: 7.2 10*3/uL (ref 4.0–10.5)
nRBC: 0 % (ref 0.0–0.2)

## 2021-05-21 LAB — COMPREHENSIVE METABOLIC PANEL
ALT: 13 U/L (ref 0–44)
AST: 24 U/L (ref 15–41)
Albumin: 3.2 g/dL — ABNORMAL LOW (ref 3.5–5.0)
Alkaline Phosphatase: 119 U/L (ref 38–126)
Anion gap: 15 (ref 5–15)
BUN: 8 mg/dL (ref 8–23)
CO2: 22 mmol/L (ref 22–32)
Calcium: 8.9 mg/dL (ref 8.9–10.3)
Chloride: 96 mmol/L — ABNORMAL LOW (ref 98–111)
Creatinine, Ser: 0.51 mg/dL (ref 0.44–1.00)
GFR, Estimated: >60 mL/min (ref >60)
Glucose, Bld: 131 mg/dL — ABNORMAL HIGH (ref 70–99)
Potassium: 3 mmol/L — ABNORMAL LOW (ref 3.5–5.1)
Sodium: 133 mmol/L — ABNORMAL LOW (ref 135–145)
Total Bilirubin: 0.2 mg/dL — ABNORMAL LOW (ref 0.3–1.2)
Total Protein: 7.2 g/dL (ref 6.5–8.1)

## 2021-05-21 LAB — MAGNESIUM: Magnesium: 1.8 mg/dL (ref 1.7–2.4)

## 2021-05-21 MED ORDER — CETUXIMAB CHEMO IV INJECTION 200 MG/100ML
250.0000 mg/m2 | Freq: Once | INTRAVENOUS | Status: AC
  Administered 2021-05-21: 400 mg via INTRAVENOUS
  Filled 2021-05-21: qty 200

## 2021-05-21 MED ORDER — FAMOTIDINE 20 MG IN NS 100 ML IVPB
20.0000 mg | Freq: Once | INTRAVENOUS | Status: AC
  Administered 2021-05-21: 20 mg via INTRAVENOUS
  Filled 2021-05-21: qty 100
  Filled 2021-05-21: qty 20

## 2021-05-21 MED ORDER — POTASSIUM CHLORIDE CRYS ER 20 MEQ PO TBCR: 40.0000 meq | EXTENDED_RELEASE_TABLET | Freq: Every day | ORAL | 1 refills | Status: DC

## 2021-05-21 MED ORDER — HEPARIN SOD (PORK) LOCK FLUSH 100 UNIT/ML IV SOLN
INTRAVENOUS | Status: AC
  Filled 2021-05-21: qty 5

## 2021-05-21 MED ORDER — DIPHENHYDRAMINE HCL 50 MG/ML IJ SOLN
50.0000 mg | Freq: Once | INTRAMUSCULAR | Status: AC
  Administered 2021-05-21: 50 mg via INTRAVENOUS
  Filled 2021-05-21: qty 1

## 2021-05-21 MED ORDER — MONTELUKAST SODIUM 10 MG PO TABS
10.0000 mg | ORAL_TABLET | Freq: Once | ORAL | Status: AC
  Administered 2021-05-21: 10 mg via ORAL
  Filled 2021-05-21: qty 1

## 2021-05-21 MED ORDER — OXYCODONE-ACETAMINOPHEN 10-325 MG PO TABS: 1.0000 | ORAL_TABLET | Freq: Four times a day (QID) | ORAL | 0 refills | Status: DC | PRN

## 2021-05-21 MED ORDER — SODIUM CHLORIDE 0.9 % IV SOLN
10.0000 mg | Freq: Once | INTRAVENOUS | Status: AC
  Administered 2021-05-21: 10 mg via INTRAVENOUS
  Filled 2021-05-21: qty 10

## 2021-05-21 MED ORDER — HEPARIN SOD (PORK) LOCK FLUSH 100 UNIT/ML IV SOLN
500.0000 [IU] | Freq: Once | INTRAVENOUS | Status: AC | PRN
Start: 2021-05-21 — End: 2021-05-21
  Administered 2021-05-21: 500 [IU]
  Filled 2021-05-21: qty 5

## 2021-05-21 MED ORDER — HEPARIN SOD (PORK) LOCK FLUSH 100 UNIT/ML IV SOLN
500.0000 [IU] | Freq: Once | INTRAVENOUS | Status: DC
  Filled 2021-05-21: qty 5

## 2021-05-21 MED ORDER — POTASSIUM CHLORIDE 20 MEQ/100ML IV SOLN
20.0000 meq | Freq: Once | INTRAVENOUS | Status: AC
  Administered 2021-05-21: 20 meq via INTRAVENOUS

## 2021-05-21 MED ORDER — SODIUM CHLORIDE 0.9% FLUSH
10.0000 mL | INTRAVENOUS | Status: DC | PRN
  Administered 2021-05-21: 10 mL via INTRAVENOUS
  Filled 2021-05-21: qty 10

## 2021-05-21 MED ORDER — CHLORHEXIDINE GLUCONATE 0.12 % MT SOLN: 15.0000 mL | Freq: Two times a day (BID) | OROMUCOSAL | 6 refills | Status: DC

## 2021-05-21 MED ORDER — SODIUM CHLORIDE 0.9 % IV SOLN
Freq: Once | INTRAVENOUS | Status: AC
  Filled 2021-05-21: qty 250

## 2021-05-21 MED ORDER — SODIUM CHLORIDE 0.9% FLUSH
10.0000 mL | INTRAVENOUS | Status: DC | PRN
  Filled 2021-05-21: qty 10

## 2021-05-21 NOTE — Unmapped (Signed)
Specialty Medication(s): Neupogen    Ms.Joplin has been dis-enrolled from the [facility] specialty pharmacy services due to patient never needed the neupogen.    Additional information provided to the patient: no    Rollen Sox  [facility] Specialty Pharmacist

## 2021-05-21 NOTE — Progress Notes (Signed)
Hematology/Oncology follow up note Shannon Schroeder [phone] [phone]   Patient Shannon Team: Pcp, No as PCP - General Shannon Situ, Kathlene November, Schroeder as PCP - Cardiology (Cardiology) Shannon Schroeder as Consulting Physician (Oncology)  CHIEF COMPLAINTS/REASON FOR VISIT:  Follow-up for head and neck cancer  HISTORY OF PRESENTING ILLNESS:   Shannon Schroeder is a  65 y.o.  female with PMH listed below was seen in consultation at the request of ER Dr. Caryn Schroeder for evaluation of abnormal CT scan.  07/10/2020 she presented emergency room for evaluation of right-sided facial swelling and pain in the right side of her mouth for the past 6 weeks.  It started in her mouth with a small ulcer which progressively got worse.  She wears denture.  Not able to chew well.  She eats soft food. She was found to have right upper gum also with yellowish discharge noted. 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  Avoid piloting.,  Right orbital apex, also the right soft palate and palate and tonsil.  Furthermore there is evidence of intracranial extension along the right V2 at the foramen rotundum Small suspicious right level 1A lymph node measuring 7 mm.  Separate small indeterminate but suspicious enhancing soft tissue nodule of the left sublingual space.  Patient was referred to oncology for further evaluation management. Patient denies fever, chills, nausea vomiting diarrhea shortness of breath or cough.  Reports 10 out of 10 severe pain of right face and numbness.  She also has headache. Reports unintentional weight loss  She reports no contactable family members.  She is in the process of pointing her roommate Shannon Schroeder to be her power of attorney.  She also wants to record today's conversation for her roommate  Shannon Schroeder. Patient has 50-pack-year smoking history.  # seen by ENT Shannon Schroeder and had a biopsy. Biopsy was sent to [facility] and the final pathology was positive for invasive squamous cell carcinoma, well-differentiated, keratinizing. 07/17/2020, brain MRI showed large right facial mass with right maxillary erosion involving the maxillary sinus, hard palate and pterygoid plates and muscles.  Perineural spreading along the multiple branches of the maxillary division of the right trigeminal nerve at the infraorbital canal, pterygopalatine fossa and foramen rotundum. 07/17/2020, PET scan showed Intense FDG uptake is associated with the large enhancing tumor which involves the right maxilla, right nasal cavity, right pterygoid palatine fossa, right orbital apex and right soft tissue palate and palatine tonsil. 2. Mild nonspecific FDG uptake is associated with the recently characterized suspicious right level 1A lymph node which measures 7 mm and has an SUV max of 2.12.  No Schroeder of distant metastasis.  Aortic atherosclerosis.  Patient also reports a history of hypertension, dysrhythmia, history of V. tach.  Patient previously was on lisinopril, atenolol, amlodipine for which she is no longer taking at this point due to lack of medication coverage.  She also does not have any primary Shannon provider. 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  On average she says she is takes 2 to 3 pills/day.  She also takes gabapentin 300 mg 3 times daily  # new onset of atrial fibrillation with rapid ventricular  response.  Patient was started on metoprolol.  Eliquis for anticoagulation.  # 09/06/2020- 09/11/2020 admitted due to A. fib, hypotension, multifocal pneumonia.  Patient was treated with IV antibiotics.  Cardiology saw the patient during admission and  added amiodarone.  Patient was discharged on 5 days course of oral antibiotics, continued on metoprolol and amiodarone for atrial fibrillation.  # Dysphagia, she declined PET tube placment.  #08/08/2020-10/11/2020 concurrent cisplatin and radiation. # 01/09/2021, PET scan showed improvement but persistent hypermetabolic activity associated with the right frontal sinus.Marked improvement in the posterior right nasopharynx with decrease in metabolic activity and tissue thickening.  Persistent but improved hypermetabolic thickened tissue in the anterior and medial right maxillary sinus.  Persistent hypermetabolic activity posterior at this level of pterygoid plate on the RIGHT. Potential new activity in the RIGHT skull base inferior medial along the sphenoid bone  # 01/14/2021, She had recurrent maxillary sinus squamous cell carcinoma and was referred to [facility] for discussion of feasibility of residual disease resection.  Patient stated at Shannon Schroeder for her oncology Shannon after that. Extensive outside medical records review was performed by me via Shannon [facility].   01/27/21 MRI brain with and without contrast were notable for recurrent disease in the right pterygopalatine fossa, along V2/V3 extending into the cavernous sinus and orbital fissure, as possibleperineural spread.  01/29/2021 patient establish Shannon with Shannon [facility]ro[facility] ENT Shannon Schroeder..  Due to the extent of disease, patient was not considered as a surgical candidate.  Shannon Schroeder sent patient to establish Shannon with Shannon Schroeder for further discussion. Patient was recommended to proceed with neoadjuvant chemotherapy  Keis regimen followed by reevaluation of surgical resection.  Patient understands that treatment goal include symptom control and increase chance of disease control and her surgical outcome.  Patient understands that if she does not respond, same treatment will be continued but with palliative intent.   Keis regimen : Cetuximab 400 mg/m2 load then 250  mg/m2 weekly, PACLItaxel 135 mg/m2 weekly,CARBOplatin AUC 2 weekly for 6 weeks Alpha gal was negative   02/06/2021  C1D1  Cetux/Taxol/Carbo, able to finish chemo without complications 1/69/6789, C1 D8 Cetux/Taxol/Carbo able to finish chemo without complications 12/8099 Cetux/Taxol/Carbo, she developed hypotension, nausea and flushing.  Patient was evaluated by nurse practitioner.  Was given IV fluid bolus with improvement of BP and the patient was able to finish infusion successfully.   03/04/2021, C1D22, Cetux/Taxol/Carbo Per note, patient developed hypersensitivity to carboplatin. "tolerated Cetuximab and Paclitaxel infusion without sequelae however during her Carbo infusion, she became acutely altered, momentarily unresponsive, acutely desaturated to 70s, then 80s on RA, hypotensive 81/51 with respiratory wheezing appreciated. The infusion was stopped. NS IVF were given along with pepcid & solumedrol. Patient's vital Schroeder slowly responded but not to baseline. Pulse was thready and skin was cold and clammy. She followed some commands, slowly. A rapid response was called for the acute change in mental status and concerning vital Schroeder. Patient was taken to ER for further evaluation and work up"   03/25/2021 C1 D29 Cetux/Taxol/Cisplatin carboplatin was switched to cisplatin Unfortunately patient developed hypersensitivity to cisplatin as well. Patient missed 5/10, 5/17 treatments due to weakness.   04/10/2019 2 repeat MRI brain with and without contrast showed evidence of disease response, however continues to have CN V involvement with extension to the skull base, unlikely to be a surgical candidate.  Her case was noted to be discussed on the head and neck tumor board at [facility]. Patient was recommended to switch to regimen with pembrolizumab and  cetuximab given the intolerance of cis-platinum  INTERVAL HISTORY Shannon PATERNOSTRO is a 65 y.o. female who has above history reviewed by me today presents for  follow up visit for management of head and neck cancer,  Patient tolerates pured food, takes nutrition supplements. Weight is stable.  Gained 1 pound. Pain is better controlled with current pain regimen.  Request refills. No nausea vomiting diarrhea,     Review of Systems  Constitutional:  Positive for fatigue. Negative for appetite change, chills, fever and unexpected weight change.  HENT:   Negative for hearing loss, mouth sores and voice change.        Facial swelling, pain   Eyes:  Negative for eye problems.  Respiratory:  Negative for chest tightness, cough and shortness of breath.   Cardiovascular:  Negative for chest pain and palpitations.  Gastrointestinal:  Negative for abdominal distention, abdominal pain, blood in stool, constipation, nausea and vomiting.       Dysphagia  Endocrine: Negative for hot flashes.  Genitourinary:  Negative for difficulty urinating and frequency.   Musculoskeletal:  Negative for arthralgias.  Skin:  Negative for itching and rash.  Neurological:  Negative for extremity weakness and headaches.  Hematological:  Negative for adenopathy.  Psychiatric/Behavioral:  Negative for confusion.    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    Anxiety    Arthritis    Bipolar disorder (HCC)    COPD (chronic obstructive pulmonary disease) (HCC)    NO INHALERS   Depression    Dysrhythmia    H/O V TACH   Head and neck cancer ([place]) 07/29/2020   Headache    H/O MIGRAINES   Hypertension    Seizures ([place])    X1 AFTER FALL    SURGICAL HISTORY: Past Surgical History:  Procedure Laterality Date   ABDOMINAL HYSTERECTOMY     partial   BREAST SURGERY     biopsy   CARDIAC CATHETERIZATION     X 2   CARPAL TUNNEL RELEASE Right    CARPECTOMY HAND Right    CATARACT EXTRACTION W/PHACO Left 03/08/2018   Procedure: CATARACT EXTRACTION PHACO AND INTRAOCULAR  LENS PLACEMENT ([place]);  Surgeon: Birder Robson, Schro[facility];  Location: [facility] ORS;  Service: Ophthalmology;  Laterality: Left;  [place] 00:55 AP% 13.5 CDE 7.52 Fluid pack lot # [id number]   CATARACT EXTRACTION W/PHACO Right 04/05/2018   Procedure: CATARACT EXTRACTION PHACO AND INTRAOCULAR LENS PLACEMENT (I[facility]);  Surgeon: Birder Robson, Schroeder;  Location: [facility] ORS;  Service: Ophthalmology;  Laterality: Right;  [place] 00:36 AP% 15.9 CDE 5.72 Fluid pack lot # [id number]   CEREBRAL ANEURYSM REPAIR     CHOLECYSTECTOMY     COLONOSCOPY WITH PROPOFOL N/A 04/26/2019   Procedure: COLONOSCOPY WITH PROPOFOL;  Surgeon: Jonathon Bellows, [facility];  Location: [facility] ENDOSCOPY;  Service: Gastroenterology;  Laterality: N/A;   PORTACATH PLACEMENT Left 08/05/2020   Procedure: INSERTION PORT-A-CATH;  Surgeon: Herbert Pun, Schroeder;  Location: [facility] ORS;  Service: General;  Laterality: Left;   TUBAL LIGATION      SOCIAL HISTORY: Social History   Socioeconomic History   Marital status: Divorced    Spouse name: Not on file   Number of children: Not on file   Years of education: 16   Highest education level: Bachelor's degree (e.g., BA, AB, BS)  Occupational History   Occupation: farmer  Tobacco Use   Smoking status: Every Day  Packs/day: 0.25    Years: 50.00    Pack years: 12.50    Types: Cigarettes   Smokeless tobacco: Never  Vaping Use   Vaping Use: Never used  Substance and Sexual Activity   Alcohol use: No   Drug use: No   Sexual activity: Not Currently  Other Topics Concern   Not on file  Social History Narrative   Pt lives at [facility].    Social Determinants of Health   Financial Resource Strain: Not on file  Food Insecurity: Not on file  Transportation Needs: Not on file  Physical Activity: Not on file  Stress: Not on file  Social Connections: Not on file  Intimate Partner Violence: Not on file    FAMILY HISTORY: Family History  Problem Relation Age of Onset   Hypertension Mother    Heart  failure Mother    Hypertension Brother    Stroke Brother    Hypertension Son    Emphysema Maternal Aunt    Hypertension Paternal Aunt    Hypertension Paternal Uncle    Hypertension Maternal Grandmother    Hypertension Paternal Grandmother     ALLERGIES:  is allergic to naproxen, aspirin, belladonna alkaloids, fluoxetine, fluoxetine hcl, paroxetine hcl, phenobarbital, and prozac [fluoxetine hcl].  MEDICATIONS:  Current Outpatient Medications  Medication Sig Dispense Refill   albuterol (VENTOLIN HFA) 108 (90 Base) MCG/ACT inhaler Inhale 2 puffs into the lungs every 6 (six) hours as needed for wheezing or shortness of breath. 8 g 2   amiodarone (PACERONE) 200 MG tablet Take 1 tablet (200 mg total) by mouth daily. 90 tablet 1   apixaban (ELIQUIS) 5 MG TABS tablet Take 1 tablet (5 mg total) by mouth 2 (two) times daily. 180 tablet 3   chlorhexidine (PERIDEX) 0.12 % solution Use as directed 15 mLs in the mouth or throat 2 (two) times daily. 473 mL 6   docusate sodium (COLACE) 100 MG capsule Take 1 capsule (100 mg total) by mouth daily. 30 capsule 1   feeding supplement, ENSURE ENLIVE, (ENSURE ENLIVE) LIQD Take 237 mLs by mouth 3 (three) times daily between meals. 237 mL 12   fentaNYL (DURAGESIC) 50 MCG/HR Place 1 patch onto the skin every 3 (three) days. 10 patch 0   gabapentin (NEURONTIN) 300 MG capsule Take 2 capsules (600 mg total) by mouth 3 (three) times daily. 180 capsule 1   magnesium chloride (SLOW-MAG) 64 MG TBEC SR tablet Take 1 tablet (64 mg total) by mouth 2 (two) times daily. 60 tablet 1   Melatonin 10 MG TABS Take 10 mg by mouth at bedtime as needed (sleep).     ondansetron (ZOFRAN-ODT) 8 MG disintegrating tablet Take 1 tablet (8 mg total) by mouth every 8 (eight) hours as needed for nausea or vomiting. 90 tablet 0   furosemide (LASIX) 20 MG tablet Take 1 tablet (20 mg total) by mouth daily as needed (for lower extremity swelling). (Patient not taking: No sig reported) 90 tablet 1    mirtazapine (REMERON) 15 MG tablet Take 1 tablet (15 mg total) by mouth at bedtime. (Patient not taking: No sig reported) 30 tablet 0   Multiple Vitamins-Minerals (CENTRUM SILVER PO) Take 1 tablet by mouth daily. Gummie (Patient not taking: No sig reported)     oxyCODONE-acetaminophen (PERCOCET) 10-325 MG tablet Take 1 tablet by mouth every 6 (six) hours as needed for pain. 120 tablet 0   polyethylene glycol powder (GLYCOLAX/MIRALAX) 17 GM/SCOOP powder Take 17 g by mouth daily as needed  for mild constipation or moderate constipation. (Patient not taking: No sig reported) 255 g 0   potassium chloride SA (KLOR-CON) 20 MEQ tablet Take 2 tablets (40 mEq total) by mouth daily. 60 tablet 1   No current facility-administered medications for this visit.   Facility-Administered Medications Ordered in Other Visits  Medication Dose Route Frequency Provider Last Rate Last Admin   heparin lock flush 100 unit/mL  500 Units Intravenous Once Shannon Schroeder       heparin lock flush 100 unit/mL  500 Units Intracatheter Once PRN Shannon Schroeder       sodium chloride flush (NS) 0.9 % injection 10 mL  10 mL Intravenous PRN Shannon Schroeder   10 mL at 10/10/20 1040   sodium chloride flush (NS) 0.9 % injection 10 mL  10 mL Intravenous PRN Shannon Schroeder   10 mL at 11/13/20 0836   sodium chloride flush (NS) 0.9 % injection 10 mL  10 mL Intravenous PRN Shannon Schroeder   10 mL at 12/30/20 0838   sodium chloride flush (NS) 0.9 % injection 10 mL  10 mL Intravenous PRN Shannon Schroeder   10 mL at 01/14/21 0921   sodium chloride flush (NS) 0.9 % injection 10 mL  10 mL Intravenous PRN Shannon Schroeder   10 mL at 05/21/21 1696   sodium chloride flush (NS) 0.9 % injection 10 mL  10 mL Intracatheter PRN Shannon Schroeder         PHYSICAL EXAMINATION: ECOG PERFORMANCE STATUS: 1 - Symptomatic but completely ambulatory Vitals:   05/21/21 0931  BP: 129/80  Pulse: 99  Resp: 18  Temp: 98 F (36.7 C)  SpO2: 100%   Filed Weights   05/21/21 0931   Weight: 113 lb 11.2 oz (51.6 kg)    Physical Exam Constitutional:      General: She is not in acute distress.    Comments: Thin built.  HENT:     Head: Normocephalic and atraumatic.     Mouth/Throat:     Comments: Patient wears dentures She has right palate deficiency, with chronic yellowish discharge covering her maxillary sinus surface.  Eyes:     General: No scleral icterus.    Comments: Right eyelid ptosis  Cardiovascular:     Rate and Rhythm: Normal rate and regular rhythm.     Heart sounds: Normal heart sounds.  Pulmonary:     Effort: Pulmonary effort is normal. No respiratory distress.     Breath sounds: No wheezing.  Abdominal:     General: Bowel sounds are normal. There is no distension.     Palpations: Abdomen is soft.  Musculoskeletal:        General: No deformity. Normal range of motion.     Cervical back: Normal range of motion and neck supple.  Skin:    General: Skin is warm and dry.     Findings: No erythema or rash.  Neurological:     Mental Status: She is alert and oriented to person, place, and time. Mental status is at baseline.     Cranial Nerves: No cranial nerve deficit.     Coordination: Coordination normal.  Psychiatric:        Mood and Affect: Mood normal.    LABORATORY DATA:  I have reviewed the data as listed Lab Results  Component Value Date   WBC 7.2 05/21/2021   HGB 11.2 (L) 05/21/2021   HCT 35.3 (L) 05/21/2021   MCV 99.4 05/21/2021  PLT 516 (H) 05/21/2021   Recent Labs    07/10/20 1311 07/12/20 1204 08/08/20 0821 04/14/21 1317 04/23/21 0810 05/07/21 1016 05/14/21 0824 05/21/21 0827  NA 136 137   < > 129*   < > 130* 130* 133*  K 2.9* 4.0   < > 4.5   < > 3.3* 3.4* 3.0*  CL 97* 98   < > 92*   < > 94* 94* 96*  CO2 28 27   < > 29   < > 26 24 22   GLUCOSE 100* 114*   < > 106*   < > 140* 134* 131*  BUN 13 8   < > 19   < > 10 9 8   CREATININE 0.43* 0.42*   < > 0.46   < > 0.41* 0.45 0.51  CALCIUM 8.9 9.0   < > 9.0   < > 8.7* 8.8*  8.9  GFRNONAA >60 >60   < > >60   < > >60 >60 >60  GFRAA >60 >60  --   --   --   --   --   --   PROT 7.0  --    < > 7.1   < > 7.3 7.4 7.2  ALBUMIN 3.7  --    < > 3.1*   < > 2.9* 3.2* 3.2*  AST 20  --    < > 15   < > 23 21 24   ALT 16  --    < > 10   < > 18 16 13   ALKPHOS 106  --    < > 115   < > 138* 126 119  BILITOT 0.5  --    < > <0.1*   < > 0.2* 0.3 0.2*  BILIDIR  --   --   --  <0.1  --   --   --   --   IBILI  --   --   --  NOT CALCULATED  --   --   --   --    < > = values in this interval not displayed.    Iron/TIBC/Ferritin/ %Sat    Component Value Date/Time   IRON 46 09/09/2020 0938   IRON 37 05/03/2018 1826   TIBC 210 (L) 09/09/2020 0938   FERRITIN 136 09/09/2020 0938   FERRITIN 52 05/03/2018 1826   IRONPCTSAT 22 09/09/2020 0938      RADIOGRAPHIC STUDIES: I have personally reviewed the radiological images as listed and agreed with the findings in the report. CT Abdomen Wo Contrast  Result Date: 05/02/2021 CLINICAL DATA:  Head and neck carcinoma. Evaluation for percutaneous gastrostomy tube placement. EXAM: CT ABDOMEN WITHOUT CONTRAST TECHNIQUE: Multidetector CT imaging of the abdomen was performed following the standard protocol without IV contrast. COMPARISON:  None. FINDINGS: Lower chest: No acute findings. Hepatobiliary: No masses visualized on this unenhanced exam. Prior cholecystectomy. No evidence of biliary obstruction. Pancreas: No mass or inflammatory process visualized on this unenhanced exam. Spleen:  Within normal limits in size. Adrenals/Urinary tract: Unremarkable. No evidence of nephrolithiasis or hydronephrosis. Stomach/Bowel: Visualized portion unremarkable. Vascular/Lymphatic: No pathologically enlarged lymph nodes identified. No evidence of abdominal aortic aneurysm. Aortic atherosclerotic calcification noted. Other:  None. Musculoskeletal: Fracture of the anterior-superior endplate of the L1 vertebral body is seen, which is acute to subacute in age. No  suspicious lytic or sclerotic bone lesions identified. IMPRESSION: No evidence of metastatic disease. Mild L1 vertebral body fracture, which is acute to subacute in age. Aortic Atherosclerosis (  ICD10-I70.0). Electronically Signed   By: Marlaine Hind M.D.   On: 05/02/2021 14:43       ASSESSMENT & PLAN:  1. 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[place])   2. Neoplasm related pain   3. Moderate protein-calorie malnutrition ([place])   4. Encounter for antineoplastic chemotherapy   5. Hypokalemia   6. Hypomagnesemia    #maxillary sinus squamous cell carcinoma, s/p concurrent chemotherapy and radiation-recurrence-Unresectable  Labs are reviewed and discussed with patient. Proceed with cycle 2 day 8 cetuximab  #Malnutrition, dysphagia She appears having better oral intake and has gained weight.  We will hold off tube placement at this point.  Continue nutrition supplementation.  #Right facial pain /swelling neoplasm related pain. Continue fentanyl patch to 50 MCG/h Q72 hours and Percocet 10/325 every 6 hours as needed.  Pain is well controlled at this point. Continue gabapentin 600 mg three times daily, other options may try carbamazepine in the future if pain is worse.  Nerve block is another option if needed.  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 Refused Peridex solution for her.  Recommend swish and spit twice daily  #Hyponatremia, either due to SIADH secondary to malignancy versus due to decreased oral intake. Na level is stable 133, improving. Liberate dietary salt intake.   #Hypokalemia, continue  potassium chloride 72meq daily.  Patient will receive IV potassium 20 mill equivalent x1 today. # Hypomagnesia, magnesium is stable.  continue Slow-Mag 1 tablet daily.    #Atrial fibrillation on amiodarone and metoprolol.Eliquis  All questions were answered. The patient knows to call the clinic with any problems questions or concerns.   Return of visit:   1 week for lab [facility]  cetuximab  Shannon Server, MD, PhD Hematology Oncology [facility] Pager- [phone] 05/21/2021

## 2021-05-21 NOTE — Patient Instructions (Signed)
[facility]  Discharge Instructions: Thank you for choosing [facility] to provide your oncology and hematology care.  If you have a lab appointment with the [facility], please go directly to the [facility] and check in at the registration area.  Wear comfortable clothing and clothing appropriate for easy access to any Portacath or PICC line.   We strive to give you quality time with your provider. You may need to reschedule your appointment if you arrive late (15 or more minutes).  Arriving late affects you and other patients whose appointments are after yours.  Also, if you miss three or more appointments without notifying the office, you may be dismissed from the clinic at the provider's discretion.      For prescription refill requests, have your pharmacy contact our office and allow 72 hours for refills to be completed.    Today you received the following chemotherapy and/or immunotherapy agents - cetuximab      To help prevent nausea and vomiting after your treatment, we encourage you to take your nausea medication as directed.  BELOW ARE SYMPTOMS THAT SHOULD BE REPORTED IMMEDIATELY: *FEVER GREATER THAN 100.4 F (38 C) OR HIGHER *CHILLS OR SWEATING *NAUSEA AND VOMITING THAT IS NOT CONTROLLED WITH YOUR NAUSEA MEDICATION *UNUSUAL SHORTNESS OF BREATH *UNUSUAL BRUISING OR BLEEDING *URINARY PROBLEMS (pain or burning when urinating, or frequent urination) *BOWEL PROBLEMS (unusual diarrhea, constipation, pain near the anus) TENDERNESS IN MOUTH AND THROAT WITH OR WITHOUT PRESENCE OF ULCERS (sore throat, sores in mouth, or a toothache) UNUSUAL RASH, SWELLING OR PAIN  UNUSUAL VAGINAL DISCHARGE OR ITCHING   Items with * indicate a potential emergency and should be followed up as soon as possible or go to the Emergency Department if any problems should occur.  Please show the CHEMOTHERAPY ALERT CARD or IMMUNOTHERAPY ALERT CARD at check-in  to the Emergency Department and triage nurse.  Should you have questions after your visit or need to cancel or reschedule your appointment, please contact [facility]  [phone] and follow the prompts.  Office hours are 8:00 a.m. to 4:30 p.m. Monday - Friday. Please note that voicemails left after 4:00 p.m. may not be returned until the following business day.  We are closed weekends and major holidays. You have access to a nurse at all times for urgent questions. Please call the main number to the clinic [phone] and follow the prompts.  For any non-urgent questions, you may also contact your provider using MyChart. We now offer e-Visits for anyone 71 and older to request care online for non-urgent symptoms. For details visit [email or web address].   Also download the MyChart app! Go to the app store, search "MyChart", open the app, select [facility], and log in with your MyChart username and password.  Due to Covid, a mask is required upon entering the hospital/clinic. If you do not have a mask, one will be given to you upon arrival. For doctor visits, patients may have 1 support person aged 80 or older with them. For treatment visits, patients cannot have anyone with them due to current Covid guidelines and our immunocompromised population.   Cetuximab injection What is this medication? CETUXIMAB (se TUX i mab) is a monoclonal antibody. It is used to treatcolorectal cancer and head and neck cancer. This medicine may be used for other purposes; ask your health care provider orpharmacist if you have questions. COMMON BRAND NAME(S): Erbitux What should I tell my care  team before I take this medication? They need to know if you have any of these conditions: heart disease history of irregular heartbeat history of low levels of calcium, magnesium, or potassium in the blood history of tick bites lung or breathing disease, like asthma red meat  allergy an unusual or allergic reaction to cetuximab, other medicines, foods, dyes, or preservatives pregnant or trying to get pregnant breast-feeding How should I use this medication? This drug is given as an infusion into a vein. It is administered in a hospitalor clinic by a specially trained health care professional. Talk to your pediatrician regarding the use of this medicine in children.Special care may be needed. Overdosage: If you think you have taken too much of this medicine contact apoison control center or emergency room at once. NOTE: This medicine is only for you. Do not share this medicine with others. What if I miss a dose? It is important not to miss your dose. Call your doctor or health careprofessional if you are unable to keep an appointment. What may interact with this medication? Interactions are not expected. This list may not describe all possible interactions. Give your health care provider a list of all the medicines, herbs, non-prescription drugs, or dietary supplements you use. Also tell them if you smoke, drink alcohol, or use illegaldrugs. Some items may interact with your medicine. What should I watch for while using this medication? Visit your doctor or health care professional for regular checks on your progress. This drug may make you feel generally unwell. This is not uncommon, as chemotherapy can affect healthy cells as well as cancer cells. Report any side effects. Continue your course of treatment even though you feel ill unlessyour doctor tells you to stop. This medicine can make you more sensitive to the sun. Keep out of the sun while taking this medicine and for 2 months after the last dose. If you cannot avoid being in the sun, wear protective clothing and use sunscreen. Do not use sunlamps or tanning beds/booths. You may need blood work done while you are taking this medicine. In some cases, you may be given additional medicines to help with side  effects.Follow all directions for their use. Call your doctor or health care professional for advice if you get a fever, chills or sore throat, or other symptoms of a cold or flu. Do not treat yourself. This drug decreases your body's ability to fight infections. Try toavoid being around people who are sick. Avoid taking products that contain aspirin, acetaminophen, ibuprofen, naproxen, or ketoprofen unless instructed by your doctor. These medicines may hide afever. Do not become pregnant while taking this medicine. Women should inform their doctor if they wish to become pregnant or think they might be pregnant. There is a potential for serious side effects to an unborn child. Use adequate birth control methods. Avoid pregnancy for at least 2 months after your last dose. Talk to your health care professional or pharmacist for more information. Do not breast-feed an infant while taking this medicine or during the 2 monthsafter your last dose. What side effects may I notice from receiving this medication? Side effects that you should report to your doctor or health care professionalas soon as possible: allergic reactions like skin rash, itching or hives, swelling of the face, lips, or tongue breathing problems changes in vision fast, irregular heartbeat feeling faint or lightheaded, falls fever, chills mouth sores redness, blistering, peeling or loosening of the skin, including inside the mouth trouble passing urine  or change in the amount of urine unusually weak or tired Side effects that usually do not require medical attention (report to yourdoctor or health care professional if they continue or are bothersome): changes in skin like acne, cracks, skin dryness constipation diarrhea headache nail changes nausea, vomiting stomach upset weight loss This list may not describe all possible side effects. Call your doctor for medical advice about side effects. You may report side effects to FDA  at1-800-FDA-1088. Where should I keep my medication? This drug is given in a hospital or clinic and will not be stored at home. NOTE: This sheet is a summary. It may not cover all possible information. If you have questions about this medicine, talk to your doctor, pharmacist, orhealth care provider.  2022 Elsevier/Gold Standard (2019-09-20 22:56:04)

## 2021-05-22 ENCOUNTER — Encounter: Payer: Self-pay | Admitting: Oncology

## 2021-05-22 MED ORDER — FENTANYL 50 MCG/HR TD PT72: 1.0000 | MEDICATED_PATCH | TRANSDERMAL | 0 refills | Status: DC

## 2021-05-28 ENCOUNTER — Other Ambulatory Visit: Payer: Self-pay

## 2021-05-28 ENCOUNTER — Inpatient Hospital Stay (HOSPITAL_BASED_OUTPATIENT_CLINIC_OR_DEPARTMENT_OTHER): Payer: Medicare Other | Admitting: Oncology

## 2021-05-28 ENCOUNTER — Inpatient Hospital Stay: Payer: Medicare Other

## 2021-05-28 ENCOUNTER — Inpatient Hospital Stay (HOSPITAL_BASED_OUTPATIENT_CLINIC_OR_DEPARTMENT_OTHER): Payer: Medicare Other | Admitting: Hospice and Palliative Medicine

## 2021-05-28 ENCOUNTER — Encounter: Payer: Self-pay | Admitting: Oncology

## 2021-05-28 ENCOUNTER — Inpatient Hospital Stay: Payer: Medicare Other | Admitting: Occupational Therapy

## 2021-05-28 VITALS — BP 127/81 | HR 91 | Temp 97.0°F | Resp 18

## 2021-05-28 VITALS — BP 132/71 | HR 78 | Temp 97.0°F | Resp 18 | Wt 116.9 lb

## 2021-05-28 DIAGNOSIS — Z5111 Encounter for antineoplastic chemotherapy: Secondary | ICD-10-CM | POA: Diagnosis not present

## 2021-05-28 DIAGNOSIS — E876 Hypokalemia: Secondary | ICD-10-CM | POA: Diagnosis not present

## 2021-05-28 DIAGNOSIS — Z515 Encounter for palliative care: Secondary | ICD-10-CM | POA: Diagnosis not present

## 2021-05-28 DIAGNOSIS — Z8679 Personal history of other diseases of the circulatory system: Secondary | ICD-10-CM

## 2021-05-28 DIAGNOSIS — C310 Malignant neoplasm of maxillary sinus: Secondary | ICD-10-CM

## 2021-05-28 DIAGNOSIS — G893 Neoplasm related pain (acute) (chronic): Secondary | ICD-10-CM

## 2021-05-28 DIAGNOSIS — E440 Moderate protein-calorie malnutrition: Secondary | ICD-10-CM

## 2021-05-28 DIAGNOSIS — C760 Malignant neoplasm of head, face and neck: Secondary | ICD-10-CM

## 2021-05-28 DIAGNOSIS — Z5112 Encounter for antineoplastic immunotherapy: Secondary | ICD-10-CM

## 2021-05-28 DIAGNOSIS — Z95828 Presence of other vascular implants and grafts: Secondary | ICD-10-CM

## 2021-05-28 LAB — CBC WITH DIFFERENTIAL/PLATELET
Abs Immature Granulocytes: 0.03 10*3/uL (ref 0.00–0.07)
Basophils Absolute: 0.1 10*3/uL (ref 0.0–0.1)
Basophils Relative: 1 %
Eosinophils Absolute: 0.4 10*3/uL (ref 0.0–0.5)
Eosinophils Relative: 5 %
HCT: 34 % — ABNORMAL LOW (ref 36.0–46.0)
Hemoglobin: 10.9 g/dL — ABNORMAL LOW (ref 12.0–15.0)
Immature Granulocytes: 0 %
Lymphocytes Relative: 10 %
Lymphs Abs: 0.8 10*3/uL (ref 0.7–4.0)
MCH: 31.6 pg (ref 26.0–34.0)
MCHC: 32.1 g/dL (ref 30.0–36.0)
MCV: 98.6 fL (ref 80.0–100.0)
Monocytes Absolute: 1 10*3/uL (ref 0.1–1.0)
Monocytes Relative: 13 %
Neutro Abs: 5.8 10*3/uL (ref 1.7–7.7)
Neutrophils Relative %: 71 %
Platelets: 417 10*3/uL — ABNORMAL HIGH (ref 150–400)
RBC: 3.45 MIL/uL — ABNORMAL LOW (ref 3.87–5.11)
RDW: 14.9 % (ref 11.5–15.5)
WBC: 8.1 10*3/uL (ref 4.0–10.5)
nRBC: 0 % (ref 0.0–0.2)

## 2021-05-28 LAB — COMPREHENSIVE METABOLIC PANEL
ALT: 14 U/L (ref 0–44)
AST: 17 U/L (ref 15–41)
Albumin: 3.2 g/dL — ABNORMAL LOW (ref 3.5–5.0)
Alkaline Phosphatase: 114 U/L (ref 38–126)
Anion gap: 9 (ref 5–15)
BUN: 12 mg/dL (ref 8–23)
CO2: 28 mmol/L (ref 22–32)
Calcium: 9 mg/dL (ref 8.9–10.3)
Chloride: 96 mmol/L — ABNORMAL LOW (ref 98–111)
Creatinine, Ser: 0.42 mg/dL — ABNORMAL LOW (ref 0.44–1.00)
GFR, Estimated: >60 mL/min (ref >60)
Glucose, Bld: 98 mg/dL (ref 70–99)
Potassium: 3.3 mmol/L — ABNORMAL LOW (ref 3.5–5.1)
Sodium: 133 mmol/L — ABNORMAL LOW (ref 135–145)
Total Bilirubin: 0.2 mg/dL — ABNORMAL LOW (ref 0.3–1.2)
Total Protein: 7 g/dL (ref 6.5–8.1)

## 2021-05-28 LAB — MAGNESIUM: Magnesium: 1.7 mg/dL (ref 1.7–2.4)

## 2021-05-28 MED ORDER — DIPHENHYDRAMINE HCL 50 MG/ML IJ SOLN
50.0000 mg | Freq: Once | INTRAMUSCULAR | Status: AC
  Administered 2021-05-28: 50 mg via INTRAVENOUS
  Filled 2021-05-28: qty 1

## 2021-05-28 MED ORDER — SODIUM CHLORIDE 0.9% FLUSH
10.0000 mL | INTRAVENOUS | Status: DC | PRN
  Administered 2021-05-28: 10 mL via INTRAVENOUS
  Filled 2021-05-28: qty 10

## 2021-05-28 MED ORDER — FAMOTIDINE 20 MG IN NS 100 ML IVPB
20.0000 mg | Freq: Once | INTRAVENOUS | Status: AC
  Administered 2021-05-28: 20 mg via INTRAVENOUS
  Filled 2021-05-28: qty 20
  Filled 2021-05-28: qty 100

## 2021-05-28 MED ORDER — POTASSIUM CHLORIDE 20 MEQ/100ML IV SOLN
20.0000 meq | Freq: Once | INTRAVENOUS | Status: AC
  Administered 2021-05-28: 20 meq via INTRAVENOUS

## 2021-05-28 MED ORDER — MONTELUKAST SODIUM 10 MG PO TABS
10.0000 mg | ORAL_TABLET | Freq: Once | ORAL | Status: AC
  Administered 2021-05-28: 10 mg via ORAL
  Filled 2021-05-28: qty 1

## 2021-05-28 MED ORDER — SODIUM CHLORIDE 0.9 % IV SOLN
10.0000 mg | Freq: Once | INTRAVENOUS | Status: AC
  Administered 2021-05-28: 10 mg via INTRAVENOUS
  Filled 2021-05-28: qty 10

## 2021-05-28 MED ORDER — HEPARIN SOD (PORK) LOCK FLUSH 100 UNIT/ML IV SOLN
500.0000 [IU] | Freq: Once | INTRAVENOUS | Status: DC | PRN
  Filled 2021-05-28: qty 5

## 2021-05-28 MED ORDER — HEPARIN SOD (PORK) LOCK FLUSH 100 UNIT/ML IV SOLN
500.0000 [IU] | Freq: Once | INTRAVENOUS | Status: AC
  Administered 2021-05-28: 500 [IU] via INTRAVENOUS
  Filled 2021-05-28: qty 5

## 2021-05-28 MED ORDER — CETUXIMAB CHEMO IV INJECTION 200 MG/100ML
250.0000 mg/m2 | Freq: Once | INTRAVENOUS | Status: AC
  Administered 2021-05-28: 400 mg via INTRAVENOUS
  Filled 2021-05-28: qty 200

## 2021-05-28 MED ORDER — SODIUM CHLORIDE 0.9 % IV SOLN
Freq: Once | INTRAVENOUS | Status: AC
  Filled 2021-05-28: qty 250

## 2021-05-28 NOTE — Patient Instructions (Signed)
[facility]  Discharge Instructions: Thank you for choosing [facility] to provide your oncology and hematology care.  If you have a lab appointment with the [facility], please go directly to the [facility] and check in at the registration area.  Wear comfortable clothing and clothing appropriate for easy access to any Portacath or PICC line.   We strive to give you quality time with your provider. You may need to reschedule your appointment if you arrive late (15 or more minutes).  Arriving late affects you and other patients whose appointments are after yours.  Also, if you miss three or more appointments without notifying the office, you may be dismissed from the clinic at the provider's discretion.      For prescription refill requests, have your pharmacy contact our office and allow 72 hours for refills to be completed.    Today you received the following chemotherapy and/or immunotherapy agents Cetuximab      To help prevent nausea and vomiting after your treatment, we encourage you to take your nausea medication as directed.  BELOW ARE SYMPTOMS THAT SHOULD BE REPORTED IMMEDIATELY: *FEVER GREATER THAN 100.4 F (38 C) OR HIGHER *CHILLS OR SWEATING *NAUSEA AND VOMITING THAT IS NOT CONTROLLED WITH YOUR NAUSEA MEDICATION *UNUSUAL SHORTNESS OF BREATH *UNUSUAL BRUISING OR BLEEDING *URINARY PROBLEMS (pain or burning when urinating, or frequent urination) *BOWEL PROBLEMS (unusual diarrhea, constipation, pain near the anus) TENDERNESS IN MOUTH AND THROAT WITH OR WITHOUT PRESENCE OF ULCERS (sore throat, sores in mouth, or a toothache) UNUSUAL RASH, SWELLING OR PAIN  UNUSUAL VAGINAL DISCHARGE OR ITCHING   Items with * indicate a potential emergency and should be followed up as soon as possible or go to the Emergency Department if any problems should occur.  Please show the CHEMOTHERAPY ALERT CARD or IMMUNOTHERAPY ALERT CARD at check-in  to the Emergency Department and triage nurse.  Should you have questions after your visit or need to cancel or reschedule your appointment, please contact [facility]  [phone] and follow the prompts.  Office hours are 8:00 a.m. to 4:30 p.m. Monday - Friday. Please note that voicemails left after 4:00 p.m. may not be returned until the following business day.  We are closed weekends and major holidays. You have access to a nurse at all times for urgent questions. Please call the main number to the clinic [phone] and follow the prompts.  For any non-urgent questions, you may also contact your provider using MyChart. We now offer e-Visits for anyone 43 and older to request care online for non-urgent symptoms. For details visit [email or web address].   Also download the MyChart app! Go to the app store, search "MyChart", open the app, select [facility], and log in with your MyChart username and password.  Due to Covid, a mask is required upon entering the hospital/clinic. If you do not have a mask, one will be given to you upon arrival. For doctor visits, patients may have 1 support person aged 26 or older with them. For treatment visits, patients cannot have anyone with them due to current Covid guidelines and our immunocompromised population.   Potassium chloride injection What is this medication? POTASSIUM CHLORIDE (poe TASS i um KLOOR ide) is a potassium supplement used to prevent and to treat low potassium. Potassium is important for the heart, muscles, and nerves. Too much or too little potassium in the body can causeserious problems. This medicine may be used for other  purposes; ask your health care provider orpharmacist if you have questions. COMMON BRAND NAME(S): PROAMP What should I tell my care team before I take this medication? They need to know if you have any of these conditions: Addison disease dehydration diabetes (high blood  sugar) heart disease high levels of potassium in the blood irregular heartbeat or rhythm kidney disease large areas of burned skin an unusual or allergic reaction to potassium, other medicines, foods, dyes, or preservatives pregnant or trying to get pregnant breast-feeding How should I use this medication? This medicine is injected into a vein. It is given by a health care provider ina hospital or clinic setting. Talk to your health care provider about the use of this medicine in children.Special care may be needed. Overdosage: If you think you have taken too much of this medicine contact apoison control center or emergency room at once. NOTE: This medicine is only for you. Do not share this medicine with others. What if I miss a dose? This does not apply. This medicine is not for regular use. What may interact with this medication? Do not take this medicine with any of the following medications: certain diuretics such as spironolactone, triamterene eplerenone sodium polystyrene sulfonate This medicine may also interact with the following medications: certain medicines for blood pressure or heart disease like lisinopril, losartan, quinapril, valsartan medicines that lower your chance of fighting infection such as cyclosporine, tacrolimus NSAIDs, medicines for pain and inflammation, like ibuprofen or naproxen other potassium supplements salt substitutes This list may not describe all possible interactions. Give your health care provider a list of all the medicines, herbs, non-prescription drugs, or dietary supplements you use. Also tell them if you smoke, drink alcohol, or use illegaldrugs. Some items may interact with your medicine. What should I watch for while using this medication? Visit your health care provider for regular checks on your progress. Tell your health care provider if your symptoms do not start to get better or if they getworse. You may need blood work while you are  taking this medicine. Avoid salt substitutes unless you are told otherwise by your health careprovider. What side effects may I notice from receiving this medication? Side effects that you should report to your doctor or health care professionalas soon as possible: allergic reactions (skin rash, itching, hives, swelling of the face, lips, tongue, or throat) confusion high potassium levels (muscle weakness, fast or irregular heartbeat) low blood pressure (dizziness, feeling faint or lightheaded, blurry vision) pain, tingling, or numbness in lips, hands, or feet pain, redness, or irritation at site where injected trouble breathing Side effects that usually do not require medical attention (report to yourdoctor or health care professional if they continue or are bothersome): diarrhea nausea, vomiting passing gas stomach pain This list may not describe all possible side effects. Call your doctor for medical advice about side effects. You may report side effects to FDA at1-800-FDA-1088. Where should I keep my medication? This medicine is given in a hospital or clinic. It will not be stored at home. NOTE: This sheet is a summary. It may not cover all possible information. If you have questions about this medicine, talk to your doctor, pharmacist, orhealth care provider.  2022 Elsevier/Gold Standard (2019-08-17 18:18:09)

## 2021-05-28 NOTE — Progress Notes (Signed)
Hematology/Oncology follow up note [facility] Telephone:([phone] [phone]   Patient Care Team: Pcp, No as PCP - General Rockey Situ, Kathlene November, MD as PCP - Cardiology (Cardiology) Earlie Server, MD as Consulting Physician (Oncology)  CHIEF COMPLAINTS/REASON FOR VISIT:  Follow-up for head and neck cancer  HISTORY OF PRESENTING ILLNESS:   Shannon Schroeder is a  65 y.o.  female with PMH listed below was seen in consultation at the request of ER Dr. [name] for evaluation of abnormal CT scan.  07/10/2020 she presented emergency room for evaluation of right-sided facial swelling and pain in the right side of her mouth for the past 6 weeks.  It started in her mouth with a small ulcer which progressively got worse.  She wears denture.  Not able to chew well.  She eats soft food. She was found to have right upper gum also with yellowish discharge noted. 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  Avoid piloting.,  Right orbital apex, also the right soft palate and palate and tonsil.  Furthermore there is evidence of intracranial extension along the right V2 at the foramen rotundum Small suspicious right level 1A lymph node measuring 7 mm.  Separate small indeterminate but suspicious enhancing soft tissue nodule of the left sublingual space.  Patient was referred to oncology for further evaluation management. Patient denies fever, chills, nausea vomiting diarrhea shortness of breath or cough.  Reports 10 out of 10 severe pain of right face and numbness.  She also has headache. Reports unintentional weight loss  She reports no contactable family members.  She is in the process of pointing her roommate [name] to be her power of attorney.  She also wants to record today's conversation for her roommate  [name]. Patient has 50-pack-year smoking history.  # seen by ENT Dr.Juengel and had a biopsy. Biopsy was sent to [facility] and the final pathology was positive for invasive squamous cell carcinoma, well-differentiated, keratinizing. 07/17/2020, brain MRI showed large right facial mass with right maxillary erosion involving the maxillary sinus, hard palate and pterygoid plates and muscles.  Perineural spreading along the multiple branches of the maxillary division of the right trigeminal nerve at the infraorbital canal, pterygopalatine fossa and foramen rotundum. 07/17/2020, PET scan showed Intense FDG uptake is associated with the large enhancing tumor which involves the right maxilla, right nasal cavity, right pterygoid palatine fossa, right orbital apex and right soft tissue palate and palatine tonsil. 2. Mild nonspecific FDG uptake is associated with the recently characterized suspicious right level 1A lymph node which measures 7 mm and has an SUV max of 2.12.  No signs of distant metastasis.  Aortic atherosclerosis.  Patient also reports a history of hypertension, dysrhythmia, history of V. tach.  Patient previously was on lisinopril, atenolol, amlodipine for which she is no longer taking at this point due to lack of medication coverage.  She also does not have any primary care provider. 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  On average she says she is takes 2 to 3 pills/day.  She also takes gabapentin 300 mg 3 times daily  # new onset of atrial fibrillation with rapid ventricular  response.  Patient was started on metoprolol.  Eliquis for anticoagulation.  # 09/06/2020- 09/11/2020 admitted due to A. fib, hypotension, multifocal pneumonia.  Patient was treated with IV antibiotics.  Cardiology saw the patient during admission and  added amiodarone.  Patient was discharged on 5 days course of oral antibiotics, continued on metoprolol and amiodarone for atrial fibrillation.  # Dysphagia, she declined PET tube placment.  #08/08/2020-10/11/2020 concurrent cisplatin and radiation. # 01/09/2021, PET scan showed improvement but persistent hypermetabolic activity associated with the right frontal sinus.Marked improvement in the posterior right nasopharynx with decrease in metabolic activity and tissue thickening.  Persistent but improved hypermetabolic thickened tissue in the anterior and medial right maxillary sinus.  Persistent hypermetabolic activity posterior at this level of pterygoid plate on the RIGHT. Potential new activity in the RIGHT skull base inferior medial along the sphenoid bone  # 01/14/2021, She had recurrent maxillary sinus squamous cell carcinoma and was referred to [facility]NT for discussion of feasibility of residual disease resection.  Patient stated at [facility] for her oncology care after that. Extensive outside medical records review was performed by me via Care Everywhere.   01/27/21 MRI brain with and without contrast were notable for recurrent disease in the right pterygopalatine fossa, along V2/V3 extending into the cavernous sinus and orbital fissure, as possibleperineural spread.  01/29/2021 patient establish care with [facility] Dr. Ihor [place]..  Due to the extent of disease, patient was not considered as a surgical candidate.  Dr.Blumberg sent patient to establish care with [facility] for further discussion. Patient was recommended to proceed with neoadjuvant chemotherapy  Keis regimen followed by reevaluation of surgical resection.  Patient understands that treatment goal include symptom control and increase chance of disease control and her surgical outcome.  Patient understands that if she does not respond, same treatment will be continued but with palliative intent.   Keis regimen : Cetuximab 400 mg/m2 load then 250  mg/m2 weekly, PACLItaxel 135 mg/m2 weekly,CARBOplatin AUC 2 weekly for 6 weeks Alpha gal was negative   02/06/2021  C1D1  Cetux/Taxol/Carbo, able to finish chemo without complications [id number], C1 D8 Cetux/Taxol/Carbo able to finish chemo without complications [id number] Cetux/Taxol/Carbo, she developed hypotension, nausea and flushing.  Patient was evaluated by nurse practitioner.  Was given IV fluid bolus with improvement of BP and the patient was able to finish infusion successfully.   03/04/2021, C1D22, Cetux/Taxol/Carbo Per note, patient developed hypersensitivity to carboplatin. "tolerated Cetuximab and Paclitaxel infusion without sequelae however during her Carbo infusion, she became acutely altered, momentarily unresponsive, acutely desaturated to 70s, then 80s on RA, hypotensive 81/51 with respiratory wheezing appreciated. The infusion was stopped. NS IVF were given along with pepcid & solumedrol. Patient's vital signs slowly responded but not to baseline. Pulse was thready and skin was cold and clammy. She followed some commands, slowly. A rapid response was called for the acute change in mental status and concerning vital signs. Patient was taken to ER for further evaluation and work up"   03/25/2021 C1 D29 Cetux/Taxol/Cisplatin carboplatin was switched to cisplatin Unfortunately patient developed hypersensitivity to cisplatin as well. Patient missed 5/10, 5/17 treatments due to weakness.   04/10/2019 2 repeat MRI brain with and without contrast showed evidence of disease response, however continues to have CN V involvement with extension to the skull base, unlikely to be a surgical candidate.  Her case was noted to be discussed on the head and neck tumor board at [facility]. Patient was recommended to switch to regimen with pembrolizumab and  cetuximab given the intolerance of cis-platinum  INTERVAL HISTORY SHANEKWA MOLT is a 65 y.o. female who has above history reviewed by me today presents for  follow up visit for management of head and neck cancer,  Patient tolerates pured food, takes nutrition supplements. She has gained 3 pounds.  Pain is better controlled with current pain regimen.  No nausea vomiting diarrhea,     Review of Systems  Constitutional:  Positive for fatigue. Negative for appetite change, chills, fever and unexpected weight change.  HENT:   Negative for hearing loss, mouth sores and voice change.        Facial swelling, pain   Eyes:  Negative for eye problems.  Respiratory:  Negative for chest tightness, cough and shortness of breath.   Cardiovascular:  Negative for chest pain and palpitations.  Gastrointestinal:  Negative for abdominal distention, abdominal pain, blood in stool, constipation, nausea and vomiting.       Dysphagia  Endocrine: Negative for hot flashes.  Genitourinary:  Negative for difficulty urinating and frequency.   Musculoskeletal:  Negative for arthralgias.  Skin:  Negative for itching and rash.  Neurological:  Negative for extremity weakness and headaches.  Hematological:  Negative for adenopathy.  Psychiatric/Behavioral:  Negative for confusion.    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    Anxiety    Arthritis    Bipolar disorder (HCC)    COPD (chronic obstructive pulmonary disease) (HCC)    NO INHALERS   Depression    Dysrhythmia    H/O V TACH   Head and neck cancer ([place]) 07/29/2020   Headache    H/O MIGRAINES   Hypertension    Seizures ([place])    X1 AFTER FALL    SURGICAL HISTORY: Past Surgical History:  Procedure Laterality Date   ABDOMINAL HYSTERECTOMY     partial   BREAST SURGERY     biopsy   CARDIAC CATHETERIZATION     X 2   CARPAL TUNNEL RELEASE Right    CARPECTOMY HAND Right    CATARACT EXTRACTION W/PHACO Left 03/08/2018   Procedure: CATARACT EXTRACTION PHACO AND INTRAOCULAR LENS PLACEMENT ([place]);   Surgeon: Birder Robson, MD;  Location: [facility] ORS;  Service: Ophthalmology;  Laterality: Left;  [place] 00:55 AP% 13.5 CDE 7.52 Fluid pack lot # [id number]   CATARACT EXTRACTION W/PHACO Right 04/05/2018   Procedure: CATARACT EXTRACTION PHACO AND INTRAOCULAR LENS PLACEMENT (IOC);  Surgeon: Birder Robson, MD;  Location: [facility] ORS;  Service: Ophthalmology;  Laterality: Right;  [place] 00:36 AP% 15.9 CDE 5.72 Fluid pack lot # [id number]   CEREBRAL ANEURYSM REPAIR     CHOLECYSTECTOMY     COLONOSCOPY WITH PROPOFOL N/A 04/26/2019   Procedure: COLONOSCOPY WITH PROPOFOL;  Surgeon: Jonathon Bellows, MD;  Location: [facility] ENDOSCOPY;  Service: Gastroenterology;  Laterality: N/A;   PORTACATH PLACEMENT Left 08/05/2020   Procedure: INSERTION PORT-A-CATH;  Surgeon: Herbert Pun, MD;  Location: [facility] ORS;  Service: General;  Laterality: Left;   TUBAL LIGATION      SOCIAL HISTORY: Social History   Socioeconomic History   Marital status: Divorced    Spouse name: Not on file   Number of children: Not on file   Years of education: 16   Highest education level: Bachelor's degree (e.g., BA, AB, BS)  Occupational History   Occupation: farmer  Tobacco Use   Smoking status: Every Day    Packs/day: 0.25  Years: 50.00    Pack years: 12.50    Types: Cigarettes   Smokeless tobacco: Never  Vaping Use   Vaping Use: Never used  Substance and Sexual Activity   Alcohol use: No   Drug use: No   Sexual activity: Not Currently  Other Topics Concern   Not on file  Social History Narrative   Pt lives at [facility].    Social Determinants of Health   Financial Resource Strain: Not on file  Food Insecurity: Not on file  Transportation Needs: Not on file  Physical Activity: Not on file  Stress: Not on file  Social Connections: Not on file  Intimate Partner Violence: Not on file    FAMILY HISTORY: Family History  Problem Relation Age of Onset   Hypertension Mother    Heart failure Mother     Hypertension Brother    Stroke Brother    Hypertension Son    Emphysema Maternal Aunt    Hypertension Paternal Aunt    Hypertension Paternal Uncle    Hypertension Maternal Grandmother    Hypertension Paternal Grandmother     ALLERGIES:  is allergic to naproxen, aspirin, belladonna alkaloids, fluoxetine, fluoxetine hcl, paroxetine hcl, phenobarbital, and prozac [fluoxetine hcl].  MEDICATIONS:  Current Outpatient Medications  Medication Sig Dispense Refill   albuterol (VENTOLIN HFA) 108 (90 Base) MCG/ACT inhaler Inhale 2 puffs into the lungs every 6 (six) hours as needed for wheezing or shortness of breath. 8 g 2   amiodarone (PACERONE) 200 MG tablet Take 1 tablet (200 mg total) by mouth daily. 90 tablet 1   apixaban (ELIQUIS) 5 MG TABS tablet Take 1 tablet (5 mg total) by mouth 2 (two) times daily. 180 tablet 3   chlorhexidine (PERIDEX) 0.12 % solution Use as directed 15 mLs in the mouth or throat 2 (two) times daily. 473 mL 6   docusate sodium (COLACE) 100 MG capsule Take 1 capsule (100 mg total) by mouth daily. 30 capsule 1   feeding supplement, ENSURE ENLIVE, (ENSURE ENLIVE) LIQD Take 237 mLs by mouth 3 (three) times daily between meals. 237 mL 12   fentaNYL (DURAGESIC) 50 MCG/HR Place 1 patch onto the skin every 3 (three) days. 10 patch 0   gabapentin (NEURONTIN) 300 MG capsule Take 2 capsules (600 mg total) by mouth 3 (three) times daily. 180 capsule 1   magnesium chloride (SLOW-MAG) 64 MG TBEC SR tablet Take 1 tablet (64 mg total) by mouth 2 (two) times daily. 60 tablet 1   Melatonin 10 MG TABS Take 10 mg by mouth at bedtime as needed (sleep).     ondansetron (ZOFRAN-ODT) 8 MG disintegrating tablet Take 1 tablet (8 mg total) by mouth every 8 (eight) hours as needed for nausea or vomiting. 90 tablet 0   oxyCODONE-acetaminophen (PERCOCET) 10-325 MG tablet Take 1 tablet by mouth every 6 (six) hours as needed for pain. 120 tablet 0   potassium chloride SA (KLOR-CON) 20 MEQ tablet Take 2  tablets (40 mEq total) by mouth daily. 60 tablet 1   furosemide (LASIX) 20 MG tablet Take 1 tablet (20 mg total) by mouth daily as needed (for lower extremity swelling). (Patient not taking: No sig reported) 90 tablet 1   mirtazapine (REMERON) 15 MG tablet Take 1 tablet (15 mg total) by mouth at bedtime. (Patient not taking: No sig reported) 30 tablet 0   Multiple Vitamins-Minerals (CENTRUM SILVER PO) Take 1 tablet by mouth daily. Gummie (Patient not taking: No sig reported)  polyethylene glycol powder (GLYCOLAX/MIRALAX) 17 GM/SCOOP powder Take 17 g by mouth daily as needed for mild constipation or moderate constipation. (Patient not taking: No sig reported) 255 g 0   No current facility-administered medications for this visit.   Facility-Administered Medications Ordered in Other Visits  Medication Dose Route Frequency Provider Last Rate Last Admin   cetuximab (ERBITUX) chemo infusion 400 mg  250 mg/m2 (Treatment Plan Recorded) Intravenous Once Earlie Server, MD 200 mL/hr at 05/28/21 1109 400 mg at 05/28/21 1109   heparin lock flush 100 unit/mL  500 Units Intravenous Once Earlie Server, MD       heparin lock flush 100 unit/mL  500 Units Intracatheter Once PRN Earlie Server, MD       potassium chloride 20 mEq in 100 mL IVPB  20 mEq Intravenous Once Earlie Server, MD       sodium chloride flush (NS) 0.9 % injection 10 mL  10 mL Intravenous PRN Earlie Server, MD   10 mL at 10/10/20 1040   sodium chloride flush (NS) 0.9 % injection 10 mL  10 mL Intravenous PRN Earlie Server, MD   10 mL at 11/13/20 0836   sodium chloride flush (NS) 0.9 % injection 10 mL  10 mL Intravenous PRN Earlie Server, MD   10 mL at 12/30/20 0838   sodium chloride flush (NS) 0.9 % injection 10 mL  10 mL Intravenous PRN Earlie Server, MD   10 mL at 01/14/21 0921   sodium chloride flush (NS) 0.9 % injection 10 mL  10 mL Intravenous PRN Earlie Server, MD   10 mL at 05/28/21 0809     PHYSICAL EXAMINATION: ECOG PERFORMANCE STATUS: 1 - Symptomatic but completely  ambulatory Vitals:   05/28/21 0903  BP: 132/71  Pulse: 78  Resp: 18  Temp: (!) 97 F (36.1 C)  SpO2: 97%   Filed Weights   05/28/21 0903  Weight: 116 lb 14.4 oz (53 kg)    Physical Exam Constitutional:      General: She is not in acute distress.    Comments: Thin built.  HENT:     Head: Normocephalic and atraumatic.     Mouth/Throat:     Comments: Patient wears dentures She has right palate deficiency, with chronic yellowish discharge covering her maxillary sinus surface.  Eyes:     General: No scleral icterus.    Comments: Right eyelid ptosis  Cardiovascular:     Rate and Rhythm: Normal rate and regular rhythm.     Heart sounds: Normal heart sounds.  Pulmonary:     Effort: Pulmonary effort is normal. No respiratory distress.     Breath sounds: No wheezing.  Abdominal:     General: Bowel sounds are normal. There is no distension.     Palpations: Abdomen is soft.  Musculoskeletal:        General: No deformity. Normal range of motion.     Cervical back: Normal range of motion and neck supple.  Skin:    General: Skin is warm and dry.     Findings: No erythema or rash.  Neurological:     Mental Status: She is alert and oriented to person, place, and time. Mental status is at baseline.     Cranial Nerves: No cranial nerve deficit.     Coordination: Coordination normal.  Psychiatric:        Mood and Affect: Mood normal.    LABORATORY DATA:  I have reviewed the data as listed Lab Results  Component Value  Date   WBC 8.1 05/28/2021   HGB 10.9 (L) 05/28/2021   HCT 34.0 (L) 05/28/2021   MCV 98.6 05/28/2021   PLT 417 (H) 05/28/2021   Recent Labs    07/10/20 1311 07/12/20 1204 08/08/20 0821 04/14/21 1317 04/23/21 0810 05/14/21 0824 05/21/21 0827 05/28/21 0809  NA 136 137   < > 129*   < > 130* 133* 133*  K 2.9* 4.0   < > 4.5   < > 3.4* 3.0* 3.3*  CL 97* 98   < > 92*   < > 94* 96* 96*  CO2 28 27   < > 29   < > '24 22 28  '$ GLUCOSE 100* 114*   < > 106*   < >  134* 131* 98  BUN 13 8   < > 19   < > '9 8 12  '$ CREATININE 0.43* 0.42*   < > 0.46   < > 0.45 0.51 0.42*  CALCIUM 8.9 9.0   < > 9.0   < > 8.8* 8.9 9.0  GFRNONAA >60 >60   < > >60   < > >60 >60 >60  GFRAA >60 >60  --   --   --   --   --   --   PROT 7.0  --    < > 7.1   < > 7.4 7.2 7.0  ALBUMIN 3.7  --    < > 3.1*   < > 3.2* 3.2* 3.2*  AST 20  --    < > 15   < > '21 24 17  '$ ALT 16  --    < > 10   < > '16 13 14  '$ ALKPHOS 106  --    < > 115   < > 126 119 114  BILITOT 0.5  --    < > <0.1*   < > 0.3 0.2* 0.2*  BILIDIR  --   --   --  <0.1  --   --   --   --   IBILI  --   --   --  NOT CALCULATED  --   --   --   --    < > = values in this interval not displayed.    Iron/TIBC/Ferritin/ %Sat    Component Value Date/Time   IRON 46 09/09/2020 0938   IRON 37 05/03/2018 1826   TIBC 210 (L) 09/09/2020 0938   FERRITIN 136 09/09/2020 0938   FERRITIN 52 05/03/2018 1826   IRONPCTSAT 22 09/09/2020 0938      RADIOGRAPHIC STUDIES: I have personally reviewed the radiological images as listed and agreed with the findings in the report. CT Abdomen Wo Contrast  Result Date: 05/02/2021 CLINICAL DATA:  Head and neck carcinoma. Evaluation for percutaneous gastrostomy tube placement. EXAM: CT ABDOMEN WITHOUT CONTRAST TECHNIQUE: Multidetector CT imaging of the abdomen was performed following the standard protocol without IV contrast. COMPARISON:  None. FINDINGS: Lower chest: No acute findings. Hepatobiliary: No masses visualized on this unenhanced exam. Prior cholecystectomy. No evidence of biliary obstruction. Pancreas: No mass or inflammatory process visualized on this unenhanced exam. Spleen:  Within normal limits in size. Adrenals/Urinary tract: Unremarkable. No evidence of nephrolithiasis or hydronephrosis. Stomach/Bowel: Visualized portion unremarkable. Vascular/Lymphatic: No pathologically enlarged lymph nodes identified. No evidence of abdominal aortic aneurysm. Aortic atherosclerotic calcification noted. Other:   None. Musculoskeletal: Fracture of the anterior-superior endplate of the L1 vertebral body is seen, which is acute to subacute in age. No suspicious lytic  or sclerotic bone lesions identified. IMPRESSION: No evidence of metastatic disease. Mild L1 vertebral body fracture, which is acute to subacute in age. Aortic Atherosclerosis (ICD10-I70.0). Electronically Signed   By: Marlaine Hind M.D.   On: 05/02/2021 14:43       ASSESSMENT & PLAN:  1. 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[place])   2. Hypokalemia   3. Hypomagnesemia   4. Neoplasm related pain   5. Moderate protein-calorie malnutrition ([place])   6. Port-A-Cath in place   7. Encounter for antineoplastic immunotherapy    #maxillary sinus squamous cell carcinoma, s/p concurrent chemotherapy and radiation-recurrence-Unresectable  Labs are reviewed and discussed with patient. Proceed with cycle 2 day 15 cetuximab  #Malnutrition, dysphagia She appears having better oral intake and has gained weight.  We will hold off tube placement at this point.  Continue nutrition supplementation.  #Right facial pain /swelling neoplasm related pain. Continue fentanyl patch to 50 MCG/h Q72 hours and Percocet 10/325 every 6 hours as needed.  Pain is well controlled at this point. Continue gabapentin 600 mg three times daily, other options may try carbamazepine in the future if pain is worse.  Nerve block is another option if needed.  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 Refused Peridex solution for her.  Recommend swish and spit twice daily  #Hyponatremia, either due to SIADH secondary to malignancy versus due to decreased oral intake. Na level is stable 133, improving. Liberate dietary salt intake.   #Hypokalemia, continue  potassium chloride 65mq daily.   # Hypomagnesia, magnesium is stable.  continue Slow-Mag 1 tablet daily.    #Atrial fibrillation on amiodarone and metoprolol.Eliquis  All questions were answered. The patient knows to call  the clinic with any problems questions or concerns.   Return of visit:   1 week for lab md Keytruda cetuximab  ZEarlie Server MD, PhD Hematology Oncology CPsa [facility]at ATitusville Area HospitalPager- 3IE:[id number]/27/2022

## 2021-05-28 NOTE — Therapy (Signed)
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    Anxiety    Arthritis    Bipolar disorder (HCC)    COPD (chronic obstructive pulmonary disease) (HCC)    NO INHALERS   Depression    Dysrhythmia    H/O V TACH   Head and neck cancer ([place]) 07/29/2020   Headache    H/O MIGRAINES   Hypertension    Seizures ([place])    X1 AFTER FALL    Past Surgical History:  Procedure Laterality Date   ABDOMINAL HYSTERECTOMY     partial   BREAST SURGERY     biopsy   CARDIAC CATHETERIZATION     X 2   CARPAL TUNNEL RELEASE Right    CARPECTOMY HAND Right    CATARACT EXTRACTION W/PHACO Left 03/08/2018   Procedure: CATARACT EXTRACTION PHACO AND INTRAOCULAR LENS PLACEMENT ([place]);  Surgeon: Birder Robson, MD;  Location: [facility] ORS;  Service: Ophthalmology;  Laterality: Left;  [place] 00:55 AP% 13.5 CDE 7.52 Fluid pack lot # [id number]   CATARACT EXTRACTION W/PHACO Right 04/05/2018   Procedure: CATARACT EXTRACTION PHACO AND INTRAOCULAR LENS PLACEMENT (IOC);  Surgeon: Birder Robson, MD;  Location: [facility] ORS;  Service: Ophthalmology;  Laterality: Right;  [place] 00:36 AP% 15.9 CDE 5.72 Fluid pack lot # [id number]   CEREBRAL ANEURYSM REPAIR     CHOLECYSTECTOMY     COLONOSCOPY WITH PROPOFOL N/A 04/26/2019   Procedure: COLONOSCOPY WITH PROPOFOL;  Surgeon: Jonathon Bellows, MD;  Location: [facility] ENDOSCOPY;  Service: Gastroenterology;  Laterality: N/A;   PORTACATH PLACEMENT Left 08/05/2020   Procedure: INSERTION PORT-A-CATH;  Surgeon: Herbert Pun,  MD;  Location: [facility] ORS;  Service: General;  Laterality: Left;   TUBAL LIGATION      There were no vitals filed for this visit.   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 No/denies                  NOTE FROM Merrily Pew Borders NP - on 05/14/21 IMPRESSION: Patient was referred back to palliative care due to worsening trigeminal neuralgia.  Patient has history of chronic radiating facial pain primarily on the right side of her face.  However, patient reports that pain is significantly improved after Dr. Tasia Catchings increase her transdermal fentanyl to 50 mcg and the gabapentin to 600 mg 3 times daily.   Discussed options with patient today for future pain management strategies.  Given trigeminal neuralgia, would recommend trial of Tegretol if needed.  Patient reports that she has previously been on Tegretol for her bipolar and  tolerated it well.   Patient denies other distressing symptoms today.  She continues to live at home with a roommate who provides her with supportive care and assistance with ADLs as needed.   PLAN: -Continue current scope of treatment -Continue gabapentin 600 mg 3 times daily -Continue transdermal fentanyl 50 mcg every 72 hours and Percocet as needed for breakthrough pain -Would recommend trial of carbamazepine if needed -Referrals for rehab screening and ST -ACP/MOST form previously reviewed -Follow-up MyChart visit 2 to 3 weeks   OT SCREEN 05/28/21: Upon arrival pt sitting in transport chair Pt denies pain and any falls in the last 6 months Live with roommate in small one story house - shower inside tub - but roommate help her in and out  - shower chair and hand  held shower  Toilet regular height but no issues getting up  Neuropathy in feet- wear most of the time slip in Pt report she cannot stand up from chair without pushing up - and not very active during day - sit most of the time  Roommate prepare food She do walk at times outside in yard - roommate or walker  BERG Balance test pt score this date 40/56 - putting pt just on the border for medium risk for falling - Low risk is 41/56   Pt provided with HEP to do and pt agree: -Sit <> stand out of chair with pillow 8-10 reps 2 x day - no pushing up  - side stepping at counter 1 min -2 min at time  - walk some circles in house or with roommate - need to try and be active about 150 min week ( 4 x 5-7 min or 2 x 10-12 min day )  Cervical AROM in all planes - WNL - and denies pain this date Pt to follow up in 2 wks again                                Patient will benefit from skilled therapeutic intervention in order to improve the following deficits and impairments:           Visit Diagnosis: Head and neck cancer [facility])    Problem List Patient Active Problem List   Diagnosis Date Noted   Encounter for antineoplastic immunotherapy 04/23/2021   Moderate protein-calorie malnutrition ([place]) 04/14/2021   Dehydration 10/14/2020   SVT (supraventricular tachycardia) ([place])    Hypomagnesemia    Dysphagia    Severe sepsis ([place]) 09/06/2020   Multifocal pneumonia 09/06/2020   Drug-induced constipation 08/26/2020   Hypotension 08/12/2020   Encounter for antineoplastic chemotherapy 08/08/2020   Atrial fibrillation with RVR ([place]) 08/08/2020   Elevated troponin 08/08/2020   Leukocytosis 08/08/2020   Hypokalemia 08/08/2020   SIRS (systemic inflammatory response syndrome) ([place]) 08/08/2020   Goals of care, counseling/discussion 08/06/2020   Head and neck cancer ([place]) 07/29/2020   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   Weight loss 07/29/2020   Neoplasm related pain  07/29/2020   Abscess of upper gum 09/12/2019   History of atrial fibrillation 03/22/2019   Bipolar affective disorder, current episode mixed ([place]) 06/29/2018   Cerebral edema ([place]) 06/25/2018   Hypertensive emergency 06/16/2018   Subarachnoid bleed ([place]) 06/16/2018   TIA (transient ischemic attack) 06/15/2018   Tobacco abuse 06/02/2018   COPD (chronic obstructive pulmonary disease) ([place]) 04/21/2018   URI (upper respiratory infection) 03/31/2018   Hypertension 03/31/2018   Tachycardia 02/02/2018  Rash of body 02/02/2018   Hyponatremia 02/02/2018    Rosalyn Gess OTR/L,CLT 05/28/2021, 10:23 AM  [facility] [address] [place], [place], [place], [address] Phone: [phone]   Fax:  [phone]  Name: Shannon Schroeder MRN: [id number] Date of Birth: [birth year]

## 2021-05-28 NOTE — Progress Notes (Signed)
[facility]  [phone] [phone]   Name: Shannon Schroeder Date: 05/28/2021 MRN: [id number]  DOB: 1956-02-25  Patient Care Team: Pcp, No as PCP - General Rockey Situ Kathlene November, MD as PCP - Cardiology (Cardiology) Earlie Server, MD as Consulting Physician (Oncology)    REASON FOR CONSULTATION: Shannon Schroeder is a 65 y.o. 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  MRI of the brain on 07/17/2020 revealed large right facial mass with right maxillary erosion and perineural spread along multiple branches of the maxillary division and right trigeminal nerve.  Patient was referred to palliative care to help address goals and manage ongoing symptoms such as pain.  SOCIAL HISTORY:     reports that she has been smoking cigarettes. She has a 12.50 pack-year smoking history. She has never used smokeless tobacco. She reports that she does not drink alcohol and does not use drugs.   Patient is unmarried.  She has children from whom she is estranged.  She lives with a roommate and feels like she has good social support.  She worked at [employer] in [employer].  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    Anxiety    Arthritis    Bipolar disorder (HCC)    COPD (chronic obstructive pulmonary disease) (HCC)    NO INHALERS   Depression    Dysrhythmia    H/O V TACH   Head and neck cancer ([place]) 07/29/2020   Headache    H/O MIGRAINES   Hypertension    Seizures ([place])    X1 AFTER FALL    PAST SURGICAL HISTORY:  Past Surgical History:  Procedure  Laterality Date   ABDOMINAL HYSTERECTOMY     partial   BREAST SURGERY     biopsy   CARDIAC CATHETERIZATION     X 2   CARPAL TUNNEL RELEASE Right    CARPECTOMY HAND Right    CATARACT EXTRACTION W/PHACO Left 03/08/2018   Procedure: CATARACT EXTRACTION PHACO AND INTRAOCULAR LENS PLACEMENT ([place]);  Surgeon: Birder Robson, MD;  Location: [facility] ORS;  Service: Ophthalmology;  Laterality: Left;  [place] 00:55 AP% 13.5 CDE 7.52 Fluid pack lot # [id number]   CATARACT EXTRACTION W/PHACO Right 04/05/2018   Procedure: CATARACT EXTRACTION PHACO AND INTRAOCULAR LENS PLACEMENT (IOC);  Surgeon: Birder Robson, MD;  Location: [facility] ORS;  Service: Ophthalmology;  Laterality: Right;  [place] 00:36 AP% 15.9 CDE 5.72 Fluid pack lot # [id number]   CEREBRAL ANEURYSM REPAIR     CHOLECYSTECTOMY     COLONOSCOPY WITH PROPOFOL N/A 04/26/2019   Procedure: COLONOSCOPY WITH PROPOFOL;  Surgeon: Jonathon Bellows, MD;  Location: [facility]COPY;  Service: Gastroenterology;  Laterality: N/A;   PORTACATH PLACEMENT Left 08/05/2020   Procedure: INSERTION PORT-A-CATH;  Surgeon: Herbert Pun, MD;  Location: [facility] ORS;  Service: General;  Laterality: Left;   TUBAL LIGATION      HEMATOLOGY/ONCOLOGY HISTORY:  Oncology History  Head and neck cancer ([place])  07/29/2020 Initial Diagnosis   Head and neck cancer ([place])   08/08/2020 - 08/08/2020 Chemotherapy   The patient had dexamethasone (DECADRON) 4 MG tablet, 8 mg,  Oral, Daily, 0 of 1 cycle, Start date: --, End date: -- palonosetron (ALOXI) injection 0.25 mg, 0.25 mg, Intravenous,  Once, 0 of 3 cycles CISplatin (PLATINOL) 166 mg in sodium chloride 0.9 % 500 mL chemo infusion, 100 mg/m2, Intravenous,  Once, 0 of 3 cycles fosaprepitant (EMEND) 150 mg in sodium chloride 0.9 % 145 mL IVPB, 150 mg, Intravenous,  Once, 0 of 3 cycles  for chemotherapy treatment.    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  MEDICATIONS:  Current Outpatient Medications  Medication Sig Dispense Refill   albuterol (VENTOLIN HFA) 108 (90 Base) MCG/ACT inhaler Inhale 2 puffs into the lungs every 6 (six) hours as needed for wheezing or shortness of breath. 8 g 2   amiodarone (PACERONE) 200 MG tablet Take 1 tablet (200 mg total) by mouth daily. 90 tablet 1   apixaban (ELIQUIS) 5 MG TABS tablet Take 1 tablet (5 mg total) by mouth 2 (two) times daily. 180 tablet 3   chlorhexidine (PERIDEX) 0.12 % solution Use as directed 15 mLs in the mouth or throat 2 (two) times daily. 473 mL 6   docusate sodium (COLACE) 100 MG capsule Take 1 capsule (100 mg total) by mouth daily. 30 capsule 1   feeding supplement, ENSURE ENLIVE, (ENSURE ENLIVE) LIQD Take 237 mLs by mouth 3 (three) times daily between meals. 237 mL 12   fentaNYL (DURAGESIC) 50 MCG/HR Place 1 patch onto the skin every 3 (three) days. 10 patch 0   furosemide (LASIX) 20 MG tablet Take 1 tablet (20 mg total) by mouth daily as needed (for lower extremity swelling). (Patient not taking: No sig reported) 90 tablet 1   gabapentin (NEURONTIN) 300 MG capsule Take 2 capsules (600 mg total) by mouth 3 (three) times daily. 180 capsule 1   magnesium chloride (SLOW-MAG) 64 MG TBEC SR tablet Take 1 tablet (64 mg total) by mouth 2 (two) times daily. 60 tablet 1   Melatonin 10 MG TABS Take 10 mg by mouth at bedtime as needed (sleep).     mirtazapine (REMERON) 15 MG tablet Take 1 tablet (15 mg total) by mouth at bedtime. (Patient not taking: No sig reported) 30 tablet 0    Multiple Vitamins-Minerals (CENTRUM SILVER PO) Take 1 tablet by mouth daily. Gummie (Patient not taking: No sig reported)     ondansetron (ZOFRAN-ODT) 8 MG disintegrating tablet Take 1 tablet (8 mg total) by mouth every 8 (eight) hours as needed for nausea or vomiting. 90 tablet 0   oxyCODONE-acetaminophen (PERCOCET) 10-325 MG tablet Take 1 tablet by mouth every 6 (six) hours as needed for pain. 120 tablet 0   polyethylene glycol powder (GLYCOLAX/MIRALAX) 17 GM/SCOOP powder Take 17 g by mouth daily as needed for mild constipation or moderate constipation. (Patient not taking: No sig reported) 255 g 0   potassium chloride SA (KLOR-CON) 20 MEQ tablet Take 2 tablets (40 mEq total) by mouth daily. 60 tablet 1   No current facility-administered  medications for this visit.   Facility-Administered Medications Ordered in Other Visits  Medication Dose Route Frequency Provider Last Rate Last Admin   heparin lock flush 100 unit/mL  500 Units Intravenous Once Earlie Server, MD       heparin lock flush 100 unit/mL  500 Units Intracatheter Once PRN Earlie Server, MD       potassium chloride 20 mEq in 100 mL IVPB  20 mEq Intravenous Once Earlie Server, MD 100 mL/hr at 05/28/21 1227 20 mEq at 05/28/21 1227   sodium chloride flush (NS) 0.9 % injection 10 mL  10 mL Intravenous PRN Earlie Server, MD   10 mL at 10/10/20 1040   sodium chloride flush (NS) 0.9 % injection 10 mL  10 mL Intravenous PRN Earlie Server, MD   10 mL at 11/13/20 0836   sodium chloride flush (NS) 0.9 % injection 10 mL  10 mL Intravenous PRN Earlie Server, MD   10 mL at 12/30/20 0838   sodium chloride flush (NS) 0.9 % injection 10 mL  10 mL Intravenous PRN Earlie Server, MD   10 mL at 01/14/21 [id number]   sodium chloride flush (NS) 0.9 % injection 10 mL  10 mL Intravenous PRN Earlie Server, MD   10 mL at 05/28/21 0809    VITAL SIGNS: LMP 11/19/1988 (Exact Date)  There were no vitals filed for this visit.  Estimated body mass index is 18.87 kg/m as calculated from the following:    Height as of 11/25/20: '5\' 6"'$  (1.676 m).   Weight as of an earlier encounter on 05/28/21: 116 lb 14.4 oz (53 kg).  LABS: CBC:    Component Value Date/Time   WBC 8.1 05/28/2021 0809   HGB 10.9 (L) 05/28/2021 0809   HGB 13.8 03/15/2019 1201   HCT 34.0 (L) 05/28/2021 0809   HCT 39.0 03/15/2019 1201   HCT 36 12/18/2016 0000   PLT 417 (H) 05/28/2021 0809   PLT 325 03/15/2019 1201   MCV 98.6 05/28/2021 0809   MCV 96 03/15/2019 1201   MCV 98 12/18/2016 0000   NEUTROABS 5.8 05/28/2021 0809   NEUTROABS 2.4 05/03/2018 1826   LYMPHSABS 0.8 05/28/2021 0809   LYMPHSABS 1.6 05/03/2018 1826   MONOABS 1.0 05/28/2021 0809   EOSABS 0.4 05/28/2021 0809   EOSABS 0.2 05/03/2018 1826   BASOSABS 0.1 05/28/2021 0809   BASOSABS 0.1 05/03/2018 1826   BASOSABS 2 12/18/2016 0000   Comprehensive Metabolic Panel:    Component Value Date/Time   NA 133 (L) 05/28/2021 0809   NA 135 03/15/2019 1201   K 3.3 (L) 05/28/2021 0809   K 4.2 06/26/2015 0000   CL 96 (L) 05/28/2021 0809   CL 91 05/25/2016 0000   CO2 28 05/28/2021 0809   CO2 25 08/19/2016 0000   BUN 12 05/28/2021 0809   BUN 12 03/15/2019 1201   BUN 11 12/18/2016 0000   CREATININE 0.42 (L) 05/28/2021 0809   CREATININE 0.67 12/18/2016 0000   GLUCOSE 98 05/28/2021 0809   CALCIUM 9.0 05/28/2021 0809   CALCIUM 8.9 12/18/2016 0000   AST 17 05/28/2021 0809   AST 15 12/18/2016 0000   ALT 14 05/28/2021 0809   ALT 11 08/19/2016 0000   ALKPHOS 114 05/28/2021 0809   ALKPHOS 117 12/18/2016 0000   BILITOT 0.2 (L) 05/28/2021 0809   BILITOT <0.2 03/15/2019 1201   BILITOT 0.3 12/18/2016 0000   PROT 7.0 05/28/2021 0809   PROT 6.8 03/15/2019 1201   ALBUMIN 3.2 (L) 05/28/2021 0809  ALBUMIN 4.5 03/15/2019 1201   ALBUMIN 4.5 12/18/2016 0000    RADIOGRAPHIC STUDIES: CT Abdomen Wo Contrast  Result Date: 05/02/2021 CLINICAL DATA:  Head and neck carcinoma. Evaluation for percutaneous gastrostomy tube placement. EXAM: CT ABDOMEN WITHOUT CONTRAST TECHNIQUE:  Multidetector CT imaging of the abdomen was performed following the standard protocol without IV contrast. COMPARISON:  None. FINDINGS: Lower chest: No acute findings. Hepatobiliary: No masses visualized on this unenhanced exam. Prior cholecystectomy. No evidence of biliary obstruction. Pancreas: No mass or inflammatory process visualized on this unenhanced exam. Spleen:  Within normal limits in size. Adrenals/Urinary tract: Unremarkable. No evidence of nephrolithiasis or hydronephrosis. Stomach/Bowel: Visualized portion unremarkable. Vascular/Lymphatic: No pathologically enlarged lymph nodes identified. No evidence of abdominal aortic aneurysm. Aortic atherosclerotic calcification noted. Other:  None. Musculoskeletal: Fracture of the anterior-superior endplate of the L1 vertebral body is seen, which is acute to subacute in age. No suspicious lytic or sclerotic bone lesions identified. IMPRESSION: No evidence of metastatic disease. Mild L1 vertebral body fracture, which is acute to subacute in age. Aortic Atherosclerosis (ICD10-I70.0). Electronically Signed   By: Marlaine Hind M.D.   On: 05/02/2021 14:43     PERFORMANCE STATUS (ECOG) : 1 - Symptomatic but completely ambulatory  Review of Systems Unless otherwise noted, a complete review of systems is negative.  Physical Exam General: NAD Pulmonary: Unlabored Extremities: no edema, no joint deformities Skin: no rashes Neurological: Weakness but otherwise nonfocal  IMPRESSION: Patient seen in infusion.  She reports that she is doing reasonably well.  She denies any significant changes or concerns.  Symptomatically, she continues to endorse occasional facial pain but says that overall pain is much improved on current regimen of gabapentin/tramadol/Percocet.  We will continue current regimen without any changes today.  PLAN: -Continue current scope of treatment -Continue gabapentin 600 mg 3 times daily -Continue transdermal fentanyl 50 mcg every 72  hours and Percocet as needed for breakthrough pain -Would recommend trial of carbamazepine if needed -Referrals pending for rehab screening and ST -ACP/MOST form previously reviewed -Follow-up MyChart visit 1 to 2 months   Patient expressed understanding and was in agreement with this plan. She also understands that She can call the clinic at any time with any questions, concerns, or complaints.     Time Total: 15 minutes  Visit consisted of counseling and education dealing with the complex and emotionally intense issues of symptom management and palliative care in the setting of serious and potentially life-threatening illness.Greater than 50%  of this time was spent counseling and coordinating care related to the above assessment and plan.  Signed by: Altha Harm, PhD, NP-C

## 2021-05-28 NOTE — Progress Notes (Signed)
Pt states had taken pain for right eye pain with relief.  Pt also states she was give an eye antibiotic drop to use in right eye yesterday.

## 2021-05-30 ENCOUNTER — Encounter: Payer: Self-pay | Admitting: Oncology

## 2021-05-30 NOTE — Telephone Encounter (Signed)
Received fax from [facility] stating that there is insufficient specimen and additional 15 slides need to be sent in order to proceed. Contacted [facility] and they stated that they have sent all slides that they have. MD notified and would like to cancel testing. Contacted [facility] to notify her of testing cancellation.

## 2021-06-04 ENCOUNTER — Inpatient Hospital Stay: Payer: Medicare Other

## 2021-06-04 ENCOUNTER — Inpatient Hospital Stay (HOSPITAL_BASED_OUTPATIENT_CLINIC_OR_DEPARTMENT_OTHER): Payer: Medicare Other | Admitting: Oncology

## 2021-06-04 ENCOUNTER — Inpatient Hospital Stay: Payer: Medicare Other | Attending: Oncology

## 2021-06-04 ENCOUNTER — Encounter: Payer: Self-pay | Admitting: Oncology

## 2021-06-04 VITALS — BP 122/69 | HR 81 | Temp 98.3°F | Resp 16 | Wt 117.0 lb

## 2021-06-04 DIAGNOSIS — Z5111 Encounter for antineoplastic chemotherapy: Secondary | ICD-10-CM | POA: Insufficient documentation

## 2021-06-04 DIAGNOSIS — J449 Chronic obstructive pulmonary disease, unspecified: Secondary | ICD-10-CM | POA: Diagnosis not present

## 2021-06-04 DIAGNOSIS — G893 Neoplasm related pain (acute) (chronic): Secondary | ICD-10-CM | POA: Insufficient documentation

## 2021-06-04 DIAGNOSIS — I10 Essential (primary) hypertension: Secondary | ICD-10-CM | POA: Insufficient documentation

## 2021-06-04 DIAGNOSIS — Z79891 Long term (current) use of opiate analgesic: Secondary | ICD-10-CM | POA: Insufficient documentation

## 2021-06-04 DIAGNOSIS — E440 Moderate protein-calorie malnutrition: Secondary | ICD-10-CM | POA: Insufficient documentation

## 2021-06-04 DIAGNOSIS — E876 Hypokalemia: Secondary | ICD-10-CM | POA: Insufficient documentation

## 2021-06-04 DIAGNOSIS — C310 Malignant neoplasm of maxillary sinus: Secondary | ICD-10-CM | POA: Insufficient documentation

## 2021-06-04 DIAGNOSIS — R634 Abnormal weight loss: Secondary | ICD-10-CM | POA: Insufficient documentation

## 2021-06-04 DIAGNOSIS — C760 Malignant neoplasm of head, face and neck: Secondary | ICD-10-CM

## 2021-06-04 DIAGNOSIS — I700 Atherosclerosis of aorta: Secondary | ICD-10-CM | POA: Insufficient documentation

## 2021-06-04 DIAGNOSIS — F319 Bipolar disorder, unspecified: Secondary | ICD-10-CM | POA: Insufficient documentation

## 2021-06-04 DIAGNOSIS — Z79899 Other long term (current) drug therapy: Secondary | ICD-10-CM | POA: Diagnosis not present

## 2021-06-04 DIAGNOSIS — F17210 Nicotine dependence, cigarettes, uncomplicated: Secondary | ICD-10-CM | POA: Diagnosis not present

## 2021-06-04 DIAGNOSIS — E871 Hypo-osmolality and hyponatremia: Secondary | ICD-10-CM | POA: Diagnosis not present

## 2021-06-04 DIAGNOSIS — Z7901 Long term (current) use of anticoagulants: Secondary | ICD-10-CM | POA: Insufficient documentation

## 2021-06-04 DIAGNOSIS — I4891 Unspecified atrial fibrillation: Secondary | ICD-10-CM | POA: Insufficient documentation

## 2021-06-04 DIAGNOSIS — Z5112 Encounter for antineoplastic immunotherapy: Secondary | ICD-10-CM

## 2021-06-04 DIAGNOSIS — Z8679 Personal history of other diseases of the circulatory system: Secondary | ICD-10-CM

## 2021-06-04 LAB — COMPREHENSIVE METABOLIC PANEL
ALT: 13 U/L (ref 0–44)
AST: 21 U/L (ref 15–41)
Albumin: 3.3 g/dL — ABNORMAL LOW (ref 3.5–5.0)
Alkaline Phosphatase: 100 U/L (ref 38–126)
Anion gap: 8 (ref 5–15)
BUN: 10 mg/dL (ref 8–23)
CO2: 28 mmol/L (ref 22–32)
Calcium: 8.8 mg/dL — ABNORMAL LOW (ref 8.9–10.3)
Chloride: 96 mmol/L — ABNORMAL LOW (ref 98–111)
Creatinine, Ser: 0.35 mg/dL — ABNORMAL LOW (ref 0.44–1.00)
GFR, Estimated: >60 mL/min (ref >60)
Glucose, Bld: 128 mg/dL — ABNORMAL HIGH (ref 70–99)
Potassium: 3.6 mmol/L (ref 3.5–5.1)
Sodium: 132 mmol/L — ABNORMAL LOW (ref 135–145)
Total Bilirubin: 0.5 mg/dL (ref 0.3–1.2)
Total Protein: 7 g/dL (ref 6.5–8.1)

## 2021-06-04 LAB — CBC WITH DIFFERENTIAL/PLATELET
Abs Immature Granulocytes: 0.03 10*3/uL (ref 0.00–0.07)
Basophils Absolute: 0.1 10*3/uL (ref 0.0–0.1)
Basophils Relative: 1 %
Eosinophils Absolute: 0.4 10*3/uL (ref 0.0–0.5)
Eosinophils Relative: 5 %
HCT: 34.3 % — ABNORMAL LOW (ref 36.0–46.0)
Hemoglobin: 11 g/dL — ABNORMAL LOW (ref 12.0–15.0)
Immature Granulocytes: 0 %
Lymphocytes Relative: 8 %
Lymphs Abs: 0.6 10*3/uL — ABNORMAL LOW (ref 0.7–4.0)
MCH: 31.3 pg (ref 26.0–34.0)
MCHC: 32.1 g/dL (ref 30.0–36.0)
MCV: 97.7 fL (ref 80.0–100.0)
Monocytes Absolute: 1.1 10*3/uL — ABNORMAL HIGH (ref 0.1–1.0)
Monocytes Relative: 13 %
Neutro Abs: 6 10*3/uL (ref 1.7–7.7)
Neutrophils Relative %: 73 %
Platelets: 414 10*3/uL — ABNORMAL HIGH (ref 150–400)
RBC: 3.51 MIL/uL — ABNORMAL LOW (ref 3.87–5.11)
RDW: 14.9 % (ref 11.5–15.5)
WBC: 8.2 10*3/uL (ref 4.0–10.5)
nRBC: 0 % (ref 0.0–0.2)

## 2021-06-04 LAB — MAGNESIUM: Magnesium: 1.8 mg/dL (ref 1.7–2.4)

## 2021-06-04 MED ORDER — MONTELUKAST SODIUM 10 MG PO TABS
10.0000 mg | ORAL_TABLET | Freq: Once | ORAL | Status: AC
  Administered 2021-06-04: 10 mg via ORAL
  Filled 2021-06-04: qty 1

## 2021-06-04 MED ORDER — DIPHENHYDRAMINE HCL 50 MG/ML IJ SOLN
50.0000 mg | Freq: Once | INTRAMUSCULAR | Status: AC
  Administered 2021-06-04: 50 mg via INTRAVENOUS
  Filled 2021-06-04: qty 1

## 2021-06-04 MED ORDER — SODIUM CHLORIDE 0.9 % IV SOLN
200.0000 mg | Freq: Once | INTRAVENOUS | Status: AC
  Administered 2021-06-04: 200 mg via INTRAVENOUS
  Filled 2021-06-04: qty 8

## 2021-06-04 MED ORDER — HEPARIN SOD (PORK) LOCK FLUSH 100 UNIT/ML IV SOLN
500.0000 [IU] | Freq: Once | INTRAVENOUS | Status: AC | PRN
  Administered 2021-06-04: 500 [IU]
  Filled 2021-06-04: qty 5

## 2021-06-04 MED ORDER — CARBAMAZEPINE ER 100 MG PO CP12: 100.0000 mg | ORAL_CAPSULE | Freq: Two times a day (BID) | ORAL | 0 refills | Status: DC

## 2021-06-04 MED ORDER — FAMOTIDINE 20 MG IN NS 100 ML IVPB
20.0000 mg | Freq: Once | INTRAVENOUS | Status: AC
  Administered 2021-06-04: 20 mg via INTRAVENOUS
  Filled 2021-06-04: qty 20

## 2021-06-04 MED ORDER — SODIUM CHLORIDE 0.9 % IV SOLN
Freq: Once | INTRAVENOUS | Status: AC
Start: 2021-06-04 — End: 2021-06-04
  Filled 2021-06-04: qty 250

## 2021-06-04 MED ORDER — SODIUM CHLORIDE 0.9 % IV SOLN
10.0000 mg | Freq: Once | INTRAVENOUS | Status: AC
  Administered 2021-06-04: 10 mg via INTRAVENOUS
  Filled 2021-06-04: qty 10

## 2021-06-04 MED ORDER — ADVAIR HFA 45-21 MCG/ACT IN AERO: 2.0000 | INHALATION_SPRAY | Freq: Two times a day (BID) | RESPIRATORY_TRACT | 3 refills | Status: DC

## 2021-06-04 MED ORDER — HEPARIN SOD (PORK) LOCK FLUSH 100 UNIT/ML IV SOLN
INTRAVENOUS | Status: AC
  Filled 2021-06-04: qty 5

## 2021-06-04 MED ORDER — CETUXIMAB CHEMO IV INJECTION 200 MG/100ML
250.0000 mg/m2 | Freq: Once | INTRAVENOUS | Status: AC
  Administered 2021-06-04: 400 mg via INTRAVENOUS
  Filled 2021-06-04: qty 200

## 2021-06-04 NOTE — Progress Notes (Signed)
Patient here for oncology follow-up appointment, expresses concerns of constant headaches and pain.

## 2021-06-04 NOTE — Patient Instructions (Signed)
[facility] ONCOL[facility]  Discharge Instructions: Thank you for choosing [facility] to provide your oncology and hematology care.  If you have a lab appointment with the [facility], please go directly to the [facility] and check in at the registration area.  Wear comfortable clothing and clothing appropriate for easy access to any Portacath or PICC line.   We strive to give you quality time with your provider. You may need to reschedule your appointment if you arrive late (15 or more minutes).  Arriving late affects you and other patients whose appointments are after yours.  Also, if you miss three or more appointments without notifying the office, you may be dismissed from the clinic at the provider's discretion.      For prescription refill requests, have your pharmacy contact our office and allow 72 hours for refills to be completed.    Today you received the following chemotherapy and/or immunotherapy agents: Cetuximab, Keytruda      To help prevent nausea and vomiting after your treatment, we encourage you to take your nausea medication as directed.  BELOW ARE SYMPTOMS THAT SHOULD BE REPORTED IMMEDIATELY: *FEVER GREATER THAN 100.4 F (38 C) OR HIGHER *CHILLS OR SWEATING *NAUSEA AND VOMITING THAT IS NOT CONTROLLED WITH YOUR NAUSEA MEDICATION *UNUSUAL SHORTNESS OF BREATH *UNUSUAL BRUISING OR BLEEDING *URINARY PROBLEMS (pain or burning when urinating, or frequent urination) *BOWEL PROBLEMS (unusual diarrhea, constipation, pain near the anus) TENDERNESS IN MOUTH AND THROAT WITH OR WITHOUT PRESENCE OF ULCERS (sore throat, sores in mouth, or a toothache) UNUSUAL RASH, SWELLING OR PAIN  UNUSUAL VAGINAL DISCHARGE OR ITCHING   Items with * indicate a potential emergency and should be followed up as soon as possible or go to the Emergency Department if any problems should occur.  Please show the CHEMOTHERAPY ALERT CARD or IMMUNOTHERAPY ALERT CARD at  check-in to the Emergency Department and triage nurse.  Should you have questions after your visit or need to cancel or reschedule your appointment, please contact [facility]  [phone] and follow the prompts.  Office hours are 8:00 a.m. to 4:30 p.m. Monday - Friday. Please note that voicemails left after 4:00 p.m. may not be returned until the following business day.  We are closed weekends and major holidays. You have access to a nurse at all times for urgent questions. Please call the main number to the clinic [phone] and follow the prompts.  For any non-urgent questions, you may also contact your provider using MyChart. We now offer e-Visits for anyone 49 and older to request care online for non-urgent symptoms. For details visit [email or web address].   Also download the MyChart app! Go to the app store, search "MyChart", open the app, select [facility], and log in with your MyChart username and password.  Due to Covid, a mask is required upon entering the hospital/clinic. If you do not have a mask, one will be given to you upon arrival. For doctor visits, patients may have 1 support person aged 29 or older with them. For treatment visits, patients cannot have anyone with them due to current Covid guidelines and our immunocompromised population. Cetuximab injection What is this medication? CETUXIMAB (se TUX i mab) is a monoclonal antibody. It is used to treatcolorectal cancer and head and neck cancer. This medicine may be used for other purposes; ask your health care provider orpharmacist if you have questions. COMMON BRAND NAME(S): Erbitux What should I tell my care team before  I take this medication? They need to know if you have any of these conditions: heart disease history of irregular heartbeat history of low levels of calcium, magnesium, or potassium in the blood history of tick bites lung or breathing disease, like asthma red meat  allergy an unusual or allergic reaction to cetuximab, other medicines, foods, dyes, or preservatives pregnant or trying to get pregnant breast-feeding How should I use this medication? This drug is given as an infusion into a vein. It is administered in a hospitalor clinic by a specially trained health care professional. Talk to your pediatrician regarding the use of this medicine in children.Special care may be needed. Overdosage: If you think you have taken too much of this medicine contact apoison control center or emergency room at once. NOTE: This medicine is only for you. Do not share this medicine with others. What if I miss a dose? It is important not to miss your dose. Call your doctor or health careprofessional if you are unable to keep an appointment. What may interact with this medication? Interactions are not expected. This list may not describe all possible interactions. Give your health care provider a list of all the medicines, herbs, non-prescription drugs, or dietary supplements you use. Also tell them if you smoke, drink alcohol, or use illegaldrugs. Some items may interact with your medicine. What should I watch for while using this medication? Visit your doctor or health care professional for regular checks on your progress. This drug may make you feel generally unwell. This is not uncommon, as chemotherapy can affect healthy cells as well as cancer cells. Report any side effects. Continue your course of treatment even though you feel ill unlessyour doctor tells you to stop. This medicine can make you more sensitive to the sun. Keep out of the sun while taking this medicine and for 2 months after the last dose. If you cannot avoid being in the sun, wear protective clothing and use sunscreen. Do not use sunlamps or tanning beds/booths. You may need blood work done while you are taking this medicine. In some cases, you may be given additional medicines to help with side  effects.Follow all directions for their use. Call your doctor or health care professional for advice if you get a fever, chills or sore throat, or other symptoms of a cold or flu. Do not treat yourself. This drug decreases your body's ability to fight infections. Try toavoid being around people who are sick. Avoid taking products that contain aspirin, acetaminophen, ibuprofen, naproxen, or ketoprofen unless instructed by your doctor. These medicines may hide afever. Do not become pregnant while taking this medicine. Women should inform their doctor if they wish to become pregnant or think they might be pregnant. There is a potential for serious side effects to an unborn child. Use adequate birth control methods. Avoid pregnancy for at least 2 months after your last dose. Talk to your health care professional or pharmacist for more information. Do not breast-feed an infant while taking this medicine or during the 2 monthsafter your last dose. What side effects may I notice from receiving this medication? Side effects that you should report to your doctor or health care professionalas soon as possible: allergic reactions like skin rash, itching or hives, swelling of the face, lips, or tongue breathing problems changes in vision fast, irregular heartbeat feeling faint or lightheaded, falls fever, chills mouth sores redness, blistering, peeling or loosening of the skin, including inside the mouth trouble passing urine or change  in the amount of urine unusually weak or tired Side effects that usually do not require medical attention (report to yourdoctor or health care professional if they continue or are bothersome): changes in skin like acne, cracks, skin dryness constipation diarrhea headache nail changes nausea, vomiting stomach upset weight loss This list may not describe all possible side effects. Call your doctor for medical advice about side effects. You may report side effects to FDA  at1-800-FDA-1088. Where should I keep my medication? This drug is given in a hospital or clinic and will not be stored at home. NOTE: This sheet is a summary. It may not cover all possible information. If you have questions about this medicine, talk to your doctor, pharmacist, orhealth care provider.  2022 Elsevier/Gold Standard (2019-09-20 22:56:04) Pembrolizumab injection What is this medication? PEMBROLIZUMAB (pem broe liz ue mab) is a monoclonal antibody. It is used totreat certain types of cancer. This medicine may be used for other purposes; ask your health care provider orpharmacist if you have questions. COMMON BRAND NAME(S): Keytruda What should I tell my care team before I take this medication? They need to know if you have any of these conditions: autoimmune diseases like Crohn's disease, ulcerative colitis, or lupus have had or planning to have an allogeneic stem cell transplant (uses someone else's stem cells) history of organ transplant history of chest radiation nervous system problems like myasthenia gravis or Guillain-Barre syndrome an unusual or allergic reaction to pembrolizumab, other medicines, foods, dyes, or preservatives pregnant or trying to get pregnant breast-feeding How should I use this medication? This medicine is for infusion into a vein. It is given by a health careprofessional in a hospital or clinic setting. A special MedGuide will be given to you before each treatment. Be sure to readthis information carefully each time. Talk to your pediatrician regarding the use of this medicine in children. While this drug may be prescribed for children as young as 6 months for selectedconditions, precautions do apply. Overdosage: If you think you have taken too much of this medicine contact apoison control center or emergency room at once. NOTE: This medicine is only for you. Do not share this medicine with others. What if I miss a dose? It is important not to miss  your dose. Call your doctor or health careprofessional if you are unable to keep an appointment. What may interact with this medication? Interactions have not been studied. This list may not describe all possible interactions. Give your health care provider a list of all the medicines, herbs, non-prescription drugs, or dietary supplements you use. Also tell them if you smoke, drink alcohol, or use illegaldrugs. Some items may interact with your medicine. What should I watch for while using this medication? Your condition will be monitored carefully while you are receiving thismedicine. You may need blood work done while you are taking this medicine. Do not become pregnant while taking this medicine or for 4 months after stopping it. Women should inform their doctor if they wish to become pregnant or think they might be pregnant. There is a potential for serious side effects to an unborn child. Talk to your health care professional or pharmacist for more information. Do not breast-feed an infant while taking this medicine orfor 4 months after the last dose. What side effects may I notice from receiving this medication? Side effects that you should report to your doctor or health care professionalas soon as possible: allergic reactions like skin rash, itching or hives, swelling of the  face, lips, or tongue bloody or black, tarry breathing problems changes in vision chest pain chills confusion constipation cough diarrhea dizziness or feeling faint or lightheaded fast or irregular heartbeat fever flushing joint pain low blood counts - this medicine may decrease the number of white blood cells, red blood cells and platelets. You may be at increased risk for infections and bleeding. muscle pain muscle weakness pain, tingling, numbness in the hands or feet persistent headache redness, blistering, peeling or loosening of the skin, including inside the mouth signs and symptoms of high blood  sugar such as dizziness; dry mouth; dry skin; fruity breath; nausea; stomach pain; increased hunger or thirst; increased urination signs and symptoms of kidney injury like trouble passing urine or change in the amount of urine signs and symptoms of liver injury like dark urine, light-colored stools, loss of appetite, nausea, right upper belly pain, yellowing of the eyes or skin sweating swollen lymph nodes weight loss Side effects that usually do not require medical attention (report to yourdoctor or health care professional if they continue or are bothersome): decreased appetite hair loss tiredness This list may not describe all possible side effects. Call your doctor for medical advice about side effects. You may report side effects to FDA at1-800-FDA-1088. Where should I keep my medication? This drug is given in a hospital or clinic and will not be stored at home. NOTE: This sheet is a summary. It may not cover all possible information. If you have questions about this medicine, talk to your doctor, pharmacist, orhealth care provider.  2022 Elsevier/Gold Standard (2019-09-20 21:44:53)

## 2021-06-04 NOTE — Progress Notes (Signed)
Nutrition Follow-up:  Patient with head and neck cancer.  Completed treatment on 10/11/2020.  Patient with recurrence and was being treated at [facility].  Patient resuming care locally.  Patient on pembrolizumab and cetuximab.  PEG on hold per MD  Met with patient during infusion.  Patient reports that appetite is good.  Continues to drink mostly 4 ensure plus shakes per day.  Oral intake of puree foods has increased so sometimes does not drink 4 shakes.  Usually eats 2 puree meals per day.  Roommate prepares meals for patient.      Medications: reviewed  Labs: reviewed  Anthropometrics:   Weight 117 lb today  112 lb 14.4 oz on 7/13  112 lb on 6/22 108 lb on 6/13 111 lb on 4/7 and 3/15 105 lb on 2/2 111 lb on 1/24  NUTRITION DIAGNOSIS: Inadequate oral intake improving    INTERVENTION:  Continue with 3-4 ensure plus shakes per day and puree foods     MONITORING, EVALUATION, GOAL: weight trends, intake   NEXT VISIT: ~ 4 weeks during treatment  Shannon Schroeder, [place], [place] Registered Dietitian [phone] (mobile)

## 2021-06-04 NOTE — Progress Notes (Signed)
Hematology/Oncology follow up note [facility] Telephone:([phone] [phone]   Patient Care Team: Pcp, No as PCP - General Rockey Situ, Kathlene November, MD as PCP - Cardiology (Cardiology) Earlie Server, MD as Consulting Physician (Oncology)  CHIEF COMPLAINTS/REASON FOR VISIT:  Follow-up for head and neck cancer  HISTORY OF PRESENTING ILLNESS:   Shannon Schroeder is a  65 y.o.  female with PMH listed below was seen in consultation at the request of ER Dr. [name] for evaluation of abnormal CT scan.  07/10/2020 she presented emergency room for evaluation of right-sided facial swelling and pain in the right side of her mouth for the past 6 weeks.  It started in her mouth with a small ulcer which progressively got worse.  She wears denture.  Not able to chew well.  She eats soft food. She was found to have right upper gum also with yellowish discharge noted. 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  Avoid piloting.,  Right orbital apex, also the right soft palate and palate and tonsil.  Furthermore there is evidence of intracranial extension along the right V2 at the foramen rotundum Small suspicious right level 1A lymph node measuring 7 mm.  Separate small indeterminate but suspicious enhancing soft tissue nodule of the left sublingual space.  Patient was referred to oncology for further evaluation management. Patient denies fever, chills, nausea vomiting diarrhea shortness of breath or cough.  Reports 10 out of 10 severe pain of right face and numbness.  She also has headache. Reports unintentional weight loss  She reports no contactable family members.  She is in the process of pointing her roommate [name] to be her power of attorney.  She also wants to record today's conversation for her roommate  [name]. Patient has 50-pack-year smoking history.  # seen by ENT Dr.Juengel and had a biopsy. Biopsy was sent to [facility] and the final pathology was positive for invasive squamous cell carcinoma, well-differentiated, keratinizing. 07/17/2020, brain MRI showed large right facial mass with right maxillary erosion involving the maxillary sinus, hard palate and pterygoid plates and muscles.  Perineural spreading along the multiple branches of the maxillary division of the right trigeminal nerve at the infraorbital canal, pterygopalatine fossa and foramen rotundum. 07/17/2020, PET scan showed Intense FDG uptake is associated with the large enhancing tumor which involves the right maxilla, right nasal cavity, right pterygoid palatine fossa, right orbital apex and right soft tissue palate and palatine tonsil. 2. Mild nonspecific FDG uptake is associated with the recently characterized suspicious right level 1A lymph node which measures 7 mm and has an SUV max of 2.12.  No signs of distant metastasis.  Aortic atherosclerosis.  Patient also reports a history of hypertension, dysrhythmia, history of V. tach.  Patient previously was on lisinopril, atenolol, amlodipine for which she is no longer taking at this point due to lack of medication coverage.  She also does not have any primary care provider. 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  On average she says she is takes 2 to 3 pills/day.  She also takes gabapentin 300 mg 3 times daily  # new onset of atrial fibrillation with rapid ventricular  response.  Patient was started on metoprolol.  Eliquis for anticoagulation.  # 09/06/2020- 09/11/2020 admitted due to A. fib, hypotension, multifocal pneumonia.  Patient was treated with IV antibiotics.  Cardiology saw the patient during admission and  added amiodarone.  Patient was discharged on 5 days course of oral antibiotics, continued on metoprolol and amiodarone for atrial fibrillation.  # Dysphagia, she declined PET tube placment.  #08/08/2020-10/11/2020 concurrent cisplatin and radiation. # 01/09/2021, PET scan showed improvement but persistent hypermetabolic activity associated with the right frontal sinus.Marked improvement in the posterior right nasopharynx with decrease in metabolic activity and tissue thickening.  Persistent but improved hypermetabolic thickened tissue in the anterior and medial right maxillary sinus.  Persistent hypermetabolic activity posterior at this level of pterygoid plate on the RIGHT. Potential new activity in the RIGHT skull base inferior medial along the sphenoid bone  # 01/14/2021, She had recurrent maxillary sinus squamous cell carcinoma and was referred to [facility] ENT for discussion of feasibility of residual disease resection.  Patient stated at [facility] for her oncology care after that. Extensive outside medical records review was performed by me via Care Everywhere.   01/27/21 MRI brain with and without contrast were notable for recurrent disease in the right pterygopalatine fossa, along V2/V3 extending into the cavernous sinus and orbital fissure, as possibleperineural spread.  01/29/2021 patient establish care with [facility] ENT Dr. Ihor Austin..  Due to the extent of disease, patient was not considered as a surgical candidate.  Dr.Blumberg sent patient to establish care with [facility] for further discussion. Patient was recommended to proceed with neoadjuvant chemotherapy  Keis regimen followed by reevaluation of surgical resection.  Patient understands that treatment goal include symptom control and increase chance of disease control and her surgical outcome.  Patient understands that if she does not respond, same treatment will be continued but with palliative intent.   Keis regimen : Cetuximab 400 mg/m2 load then 250  mg/m2 weekly, PACLItaxel 135 mg/m2 weekly,CARBOplatin AUC 2 weekly for 6 weeks Alpha gal was negative   02/06/2021  C1D1  Cetux/Taxol/Carbo, able to finish chemo without complications [id number], C1 D8 Cetux/Taxol/Carbo able to finish chemo without complications [id number] Cetux/Taxol/Carbo, she developed hypotension, nausea and flushing.  Patient was evaluated by nurse practitioner.  Was given IV fluid bolus with improvement of BP and the patient was able to finish infusion successfully.   03/04/2021, C1D22, Cetux/Taxol/Carbo Per note, patient developed hypersensitivity to carboplatin. "tolerated Cetuximab and Paclitaxel infusion without sequelae however during her Carbo infusion, she became acutely altered, momentarily unresponsive, acutely desaturated to 70s, then 80s on RA, hypotensive 81/51 with respiratory wheezing appreciated. The infusion was stopped. NS IVF were given along with pepcid & solumedrol. Patient's vital signs slowly responded but not to baseline. Pulse was thready and skin was cold and clammy. She followed some commands, slowly. A rapid response was called for the acute change in mental status and concerning vital signs. Patient was taken to ER for further evaluation and work up"   03/25/2021 C1 D29 Cetux/Taxol/Cisplatin carboplatin was switched to cisplatin Unfortunately patient developed hypersensitivity to cisplatin as well. Patient missed 5/10, 5/17 treatments due to weakness.   04/10/2019 2 repeat MRI brain with and without contrast showed evidence of disease response, however continues to have CN V involvement with extension to the skull base, unlikely to be a surgical candidate.  Her case was noted to be discussed on the head and neck tumor board at [facility]. Patient was recommended to switch to regimen with pembrolizumab and  cetuximab given the intolerance of cis-platinum  INTERVAL HISTORY Shannon Schroeder is a 65 y.o. female who has above history reviewed by me today presents for  follow up visit for management of head and neck cancer,  Patient tolerates pured food, takes nutrition supplements. She has gained weight..  Pain is partially relieved by current regimen.  She takes Percocet 10/325 every 4-6 hours as needed.  On average, patient reports taking 5 tablets/day.  On fentanyl patch 50 MCG    Review of Systems  Constitutional:  Positive for fatigue. Negative for appetite change, chills, fever and unexpected weight change.  HENT:   Negative for hearing loss, mouth sores and voice change.        Facial swelling, pain   Eyes:  Negative for eye problems.  Respiratory:  Negative for chest tightness, cough and shortness of breath.   Cardiovascular:  Negative for chest pain and palpitations.  Gastrointestinal:  Negative for abdominal distention, abdominal pain, blood in stool, constipation, nausea and vomiting.       Dysphagia  Endocrine: Negative for hot flashes.  Genitourinary:  Negative for difficulty urinating and frequency.   Musculoskeletal:  Negative for arthralgias.  Skin:  Negative for itching and rash.  Neurological:  Negative for extremity weakness and headaches.  Hematological:  Negative for adenopathy.  Psychiatric/Behavioral:  Negative for confusion.    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    Anxiety    Arthritis    Bipolar disorder (HCC)    COPD (chronic obstructive pulmonary disease) (HCC)    NO INHALERS   Depression    Dysrhythmia    H/O V TACH   Head and neck cancer ([place]) 07/29/2020   Headache    H/O MIGRAINES   Hypertension    Seizures ([place])    X1 AFTER FALL    SURGICAL HISTORY: Past Surgical History:  Procedure Laterality Date   ABDOMINAL HYSTERECTOMY     partial   BREAST SURGERY     biopsy   CARDIAC CATHETERIZATION     X 2   CARPAL TUNNEL RELEASE Right    CARPECTOMY HAND Right    CATARACT EXTRACTION W/PHACO Left  03/08/2018   Procedure: CATARACT EXTRACTION PHACO AND INTRAOCULAR LENS PLACEMENT ([place]);  Surgeon: Birder Robson, MD;  Location: [facility] ORS;  Service: Ophthalmology;  Laterality: Left;  [place] 00:55 AP% 13.5 CDE 7.52 Fluid pack lot # [id number]   CATARACT EXTRACTION W/PHACO Right 04/05/2018   Procedure: CATARACT EXTRACTION PHACO AND INTRAOCULAR LENS PLACEMENT ([facility]);  Surgeon: Birder Robson, MD;  Location: [facility] ORS;  Service: Ophthalmology;  Laterality: Right;  [place] 00:36 AP% 15.9 CDE 5.72 Fluid pack lot # [id number]   CEREBRAL ANEURYSM REPAIR     CHOLECYSTECTOMY     COLONOSCOPY WITH PROPOFOL N/A 04/26/2019   Procedure: COLONOSCOPY WITH PROPOFOL;  Surgeon: Jonathon Bellows, MD;  Location: [facility] ENDOSCOPY;  Service: Gastroenterology;  Laterality: N/A;   PORTACATH PLACEMENT Left 08/05/2020   Procedure: INSERTION PORT-A-CATH;  Surgeon: Herbert Pun, MD;  Location: [facility] ORS;  Service: General;  Laterality: Left;   TUBAL LIGATION      SOCIAL HISTORY: Social History   Socioeconomic History   Marital status: Divorced    Spouse name: Not on file   Number of children: Not on file   Years of education: 16   Highest education level: Bachelor's degree (e.g., BA, AB, BS)  Occupational History  Occupation: farmer  Tobacco Use   Smoking status: Every Day    Packs/day: 0.25    Years: 50.00    Pack years: 12.50    Types: Cigarettes   Smokeless tobacco: Never  Vaping Use   Vaping Use: Never used  Substance and Sexual Activity   Alcohol use: No   Drug use: No   Sexual activity: Not Currently  Other Topics Concern   Not on file  Social History Narrative   Pt lives at [facility].    Social Determinants of Health   Financial Resource Strain: Not on file  Food Insecurity: Not on file  Transportation Needs: Not on file  Physical Activity: Not on file  Stress: Not on file  Social Connections: Not on file  Intimate Partner Violence: Not on file    FAMILY HISTORY: Family History   Problem Relation Age of Onset   Hypertension Mother    Heart failure Mother    Hypertension Brother    Stroke Brother    Hypertension Son    Emphysema Maternal Aunt    Hypertension Paternal Aunt    Hypertension Paternal Uncle    Hypertension Maternal Grandmother    Hypertension Paternal Grandmother     ALLERGIES:  is allergic to naproxen, aspirin, belladonna alkaloids, fluoxetine, fluoxetine hcl, paroxetine hcl, phenobarbital, and prozac [fluoxetine hcl].  MEDICATIONS:  Current Outpatient Medications  Medication Sig Dispense Refill   albuterol (VENTOLIN HFA) 108 (90 Base) MCG/ACT inhaler Inhale 2 puffs into the lungs every 6 (six) hours as needed for wheezing or shortness of breath. 8 g 2   amiodarone (PACERONE) 200 MG tablet Take 1 tablet (200 mg total) by mouth daily. 90 tablet 1   apixaban (ELIQUIS) 5 MG TABS tablet Take 1 tablet (5 mg total) by mouth 2 (two) times daily. 180 tablet 3   carbamazepine (CARBATROL) 100 MG 12 hr capsule Take 1 capsule (100 mg total) by mouth 2 (two) times daily. 30 capsule 0   chlorhexidine (PERIDEX) 0.12 % solution Use as directed 15 mLs in the mouth or throat 2 (two) times daily. 473 mL 6   docusate sodium (COLACE) 100 MG capsule Take 1 capsule (100 mg total) by mouth daily. 30 capsule 1   feeding supplement, ENSURE ENLIVE, (ENSURE ENLIVE) LIQD Take 237 mLs by mouth 3 (three) times daily between meals. 237 mL 12   fentaNYL (DURAGESIC) 50 MCG/HR Place 1 patch onto the skin every 3 (three) days. 10 patch 0   fluticasone-salmeterol (ADVAIR HFA) 45-21 MCG/ACT inhaler Inhale 2 puffs into the lungs 2 (two) times daily. 1 each 3   gabapentin (NEURONTIN) 300 MG capsule Take 2 capsules (600 mg total) by mouth 3 (three) times daily. 180 capsule 1   magnesium chloride (SLOW-MAG) 64 MG TBEC SR tablet Take 1 tablet (64 mg total) by mouth 2 (two) times daily. 60 tablet 1   Melatonin 10 MG TABS Take 10 mg by mouth at bedtime as needed (sleep).     ondansetron  (ZOFRAN-ODT) 8 MG disintegrating tablet Take 1 tablet (8 mg total) by mouth every 8 (eight) hours as needed for nausea or vomiting. 90 tablet 0   oxyCODONE-acetaminophen (PERCOCET) 10-325 MG tablet Take 1 tablet by mouth every 6 (six) hours as needed for pain. 120 tablet 0   polyethylene glycol powder (GLYCOLAX/MIRALAX) 17 GM/SCOOP powder Take 17 g by mouth daily as needed for mild constipation or moderate constipation. 255 g 0   potassium chloride SA (KLOR-CON) 20 MEQ tablet Take 2  tablets (40 mEq total) by mouth daily. 60 tablet 1   furosemide (LASIX) 20 MG tablet Take 1 tablet (20 mg total) by mouth daily as needed (for lower extremity swelling). (Patient not taking: No sig reported) 90 tablet 1   mirtazapine (REMERON) 15 MG tablet Take 1 tablet (15 mg total) by mouth at bedtime. (Patient not taking: No sig reported) 30 tablet 0   Multiple Vitamins-Minerals (CENTRUM SILVER PO) Take 1 tablet by mouth daily. Gummie (Patient not taking: No sig reported)     No current facility-administered medications for this visit.   Facility-Administered Medications Ordered in Other Visits  Medication Dose Route Frequency Provider Last Rate Last Admin   sodium chloride flush (NS) 0.9 % injection 10 mL  10 mL Intravenous PRN Earlie Server, MD   10 mL at 10/10/20 1040   sodium chloride flush (NS) 0.9 % injection 10 mL  10 mL Intravenous PRN Earlie Server, MD   10 mL at 11/13/20 0836   sodium chloride flush (NS) 0.9 % injection 10 mL  10 mL Intravenous PRN Earlie Server, MD   10 mL at 12/30/20 0838   sodium chloride flush (NS) 0.9 % injection 10 mL  10 mL Intravenous PRN Earlie Server, MD   10 mL at 01/14/21 0921     PHYSICAL EXAMINATION: ECOG PERFORMANCE STATUS: 1 - Symptomatic but completely ambulatory Vitals:   06/04/21 0923  BP: 122/69  Pulse: 81  Resp: 16  Temp: 98.3 F (36.8 C)  SpO2: 96%   Filed Weights   06/04/21 0923  Weight: 117 lb (53.1 kg)    Physical Exam Constitutional:      General: She is not in  acute distress.    Comments: Thin built.  Patient sits in the wheelchair  HENT:     Head: Normocephalic and atraumatic.     Mouth/Throat:     Comments: Patient wears dentures She has right palate deficiency Right facial swelling and tenderness. Eyes:     General: No scleral icterus.    Comments: Right eyelid ptosis  Cardiovascular:     Rate and Rhythm: Normal rate and regular rhythm.     Heart sounds: Normal heart sounds.  Pulmonary:     Effort: Pulmonary effort is normal. No respiratory distress.     Breath sounds: Wheezing present.  Abdominal:     General: Bowel sounds are normal. There is no distension.     Palpations: Abdomen is soft.  Musculoskeletal:        General: No deformity. Normal range of motion.     Cervical back: Normal range of motion and neck supple.  Skin:    General: Skin is warm and dry.     Findings: No erythema or rash.  Neurological:     Mental Status: She is alert and oriented to person, place, and time. Mental status is at baseline.     Cranial Nerves: No cranial nerve deficit.     Coordination: Coordination normal.  Psychiatric:        Mood and Affect: Mood normal.    LABORATORY DATA:  I have reviewed the data as listed Lab Results  Component Value Date   WBC 8.2 06/04/2021   HGB 11.0 (L) 06/04/2021   HCT 34.3 (L) 06/04/2021   MCV 97.7 06/04/2021   PLT 414 (H) 06/04/2021   Recent Labs    07/10/20 1311 07/12/20 1204 08/08/20 0821 04/14/21 1317 04/23/21 0810 05/21/21 0827 05/28/21 0809 06/04/21 0859  NA 136 137   < >  129*   < > 133* 133* 132*  K 2.9* 4.0   < > 4.5   < > 3.0* 3.3* 3.6  CL 97* 98   < > 92*   < > 96* 96* 96*  CO2 28 27   < > 29   < > '22 28 28  '$ GLUCOSE 100* 114*   < > 106*   < > 131* 98 128*  BUN 13 8   < > 19   < > '8 12 10  '$ CREATININE 0.43* 0.42*   < > 0.46   < > 0.51 0.42* 0.35*  CALCIUM 8.9 9.0   < > 9.0   < > 8.9 9.0 8.8*  GFRNONAA >60 >60   < > >60   < > >60 >60 >60  GFRAA >60 >60  --   --   --   --   --   --    PROT 7.0  --    < > 7.1   < > 7.2 7.0 7.0  ALBUMIN 3.7  --    < > 3.1*   < > 3.2* 3.2* 3.3*  AST 20  --    < > 15   < > '24 17 21  '$ ALT 16  --    < > 10   < > '13 14 13  '$ ALKPHOS 106  --    < > 115   < > 119 114 100  BILITOT 0.5  --    < > <0.1*   < > 0.2* 0.2* 0.5  BILIDIR  --   --   --  <0.1  --   --   --   --   IBILI  --   --   --  NOT CALCULATED  --   --   --   --    < > = values in this interval not displayed.    Iron/TIBC/Ferritin/ %Sat    Component Value Date/Time   IRON 46 09/09/2020 0938   IRON 37 05/03/2018 1826   TIBC 210 (L) 09/09/2020 0938   FERRITIN 136 09/09/2020 0938   FERRITIN 52 05/03/2018 1826   IRONPCTSAT 22 09/09/2020 0938      RADIOGRAPHIC STUDIES: I have personally reviewed the radiological images as listed and agreed with the findings in the report. No results found.     ASSESSMENT & PLAN:  1. 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[place])   2. Moderate protein-calorie malnutrition ([place])   3. Encounter for antineoplastic immunotherapy   4. Hypomagnesemia   5. Hypokalemia    #maxillary sinus squamous cell carcinoma, s/p concurrent chemotherapy and radiation-recurrence-Unresectable  Labs are reviewed and discussed with patient.  Proceed with cycle 3 pembrolizumab and  cetuximab. Plan to repeat images after 4 cycles  #Malnutrition, dysphagia She appears having better oral intake and has gained weight.  We will hold off tube placement at this point.  Continue nutrition supplementation.  #Right facial pain /swelling neoplasm related pain/ Perineural tumor spread V1, V2, V3  Continue fentanyl patch to 50 MCG/h Q72 hours and Percocet 10/325 every 6 hours as needed.  Pain is well controlled at this point.Continue gabapentin 600 mg three times daily, add carbamazepine for trigeminal neuralgia100 mg twice daily.  Nerve block is another option if needed.  I will refer patient to establish care with Neuro oncology Dr. 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 Refilled Peridex solution for her.  Recommend swish and spit twice daily  #Hyponatremia,  either due to SIADH secondary to malignancy versus due to decreased oral intake. Na level is stable 133, improving. Liberate dietary salt intake.   #Hypokalemia, continue  potassium chloride 53mq daily.   # Hypomagnesia, magnesium is stable.  continue Slow-Mag 1 tablet daily.    #Atrial fibrillation on amiodarone and metoprolol.Eliquis  All questions were answered. The patient knows to call the clinic with any problems questions or concerns.   Return of visit:   1 week for lab md cetuximab  ZEarlie Server MD, PhD Hematology Oncology COdyssey Asc [facility] LLCat AUniversity Of Louisville HospitalPager- [phone]/12/2020

## 2021-06-05 ENCOUNTER — Telehealth: Payer: Self-pay

## 2021-06-05 ENCOUNTER — Encounter: Payer: Self-pay | Admitting: Oncology

## 2021-06-05 DIAGNOSIS — C310 Malignant neoplasm of maxillary sinus: Secondary | ICD-10-CM

## 2021-06-05 NOTE — Telephone Encounter (Signed)
-----   Message from Earlie Server, MD sent at 06/04/2021  9:08 PM EDT ----- Please let patient know that I recommend patient to establish with neuro oncologist Dr. Mickeal Skinner for an opinion of controlled her nerve pain. Refer if she agrees

## 2021-06-05 NOTE — Telephone Encounter (Signed)
Spoke to pt and she is agreeable to seeing Neuro onc. Referral has been entered in [place].

## 2021-06-11 ENCOUNTER — Encounter: Payer: Self-pay | Admitting: Oncology

## 2021-06-11 ENCOUNTER — Inpatient Hospital Stay: Payer: Medicare Other

## 2021-06-11 ENCOUNTER — Other Ambulatory Visit: Payer: Self-pay

## 2021-06-11 ENCOUNTER — Inpatient Hospital Stay (HOSPITAL_BASED_OUTPATIENT_CLINIC_OR_DEPARTMENT_OTHER): Payer: Medicare Other | Admitting: Oncology

## 2021-06-11 ENCOUNTER — Inpatient Hospital Stay: Payer: Medicare Other | Admitting: Occupational Therapy

## 2021-06-11 VITALS — BP 122/62 | HR 93 | Temp 97.8°F | Resp 18 | Wt 117.2 lb

## 2021-06-11 DIAGNOSIS — E876 Hypokalemia: Secondary | ICD-10-CM | POA: Diagnosis not present

## 2021-06-11 DIAGNOSIS — M6281 Muscle weakness (generalized): Secondary | ICD-10-CM

## 2021-06-11 DIAGNOSIS — C310 Malignant neoplasm of maxillary sinus: Secondary | ICD-10-CM

## 2021-06-11 DIAGNOSIS — E440 Moderate protein-calorie malnutrition: Secondary | ICD-10-CM

## 2021-06-11 DIAGNOSIS — G893 Neoplasm related pain (acute) (chronic): Secondary | ICD-10-CM

## 2021-06-11 DIAGNOSIS — C760 Malignant neoplasm of head, face and neck: Secondary | ICD-10-CM

## 2021-06-11 DIAGNOSIS — Z5111 Encounter for antineoplastic chemotherapy: Secondary | ICD-10-CM | POA: Diagnosis not present

## 2021-06-11 DIAGNOSIS — Z5112 Encounter for antineoplastic immunotherapy: Secondary | ICD-10-CM

## 2021-06-11 DIAGNOSIS — Z8679 Personal history of other diseases of the circulatory system: Secondary | ICD-10-CM

## 2021-06-11 LAB — COMPREHENSIVE METABOLIC PANEL
ALT: 10 U/L (ref 0–44)
AST: 20 U/L (ref 15–41)
Albumin: 3.1 g/dL — ABNORMAL LOW (ref 3.5–5.0)
Alkaline Phosphatase: 119 U/L (ref 38–126)
Anion gap: 10 (ref 5–15)
BUN: 8 mg/dL (ref 8–23)
CO2: 27 mmol/L (ref 22–32)
Calcium: 8.5 mg/dL — ABNORMAL LOW (ref 8.9–10.3)
Chloride: 93 mmol/L — ABNORMAL LOW (ref 98–111)
Creatinine, Ser: 0.42 mg/dL — ABNORMAL LOW (ref 0.44–1.00)
GFR, Estimated: >60 mL/min (ref >60)
Glucose, Bld: 135 mg/dL — ABNORMAL HIGH (ref 70–99)
Potassium: 2.7 mmol/L — CL (ref 3.5–5.1)
Sodium: 130 mmol/L — ABNORMAL LOW (ref 135–145)
Total Bilirubin: 0.4 mg/dL (ref 0.3–1.2)
Total Protein: 7.2 g/dL (ref 6.5–8.1)

## 2021-06-11 LAB — CBC WITH DIFFERENTIAL/PLATELET
Abs Immature Granulocytes: 0.03 10*3/uL (ref 0.00–0.07)
Basophils Absolute: 0.1 10*3/uL (ref 0.0–0.1)
Basophils Relative: 1 %
Eosinophils Absolute: 0.5 10*3/uL (ref 0.0–0.5)
Eosinophils Relative: 8 %
HCT: 32.2 % — ABNORMAL LOW (ref 36.0–46.0)
Hemoglobin: 10.4 g/dL — ABNORMAL LOW (ref 12.0–15.0)
Immature Granulocytes: 1 %
Lymphocytes Relative: 12 %
Lymphs Abs: 0.8 10*3/uL (ref 0.7–4.0)
MCH: 30.8 pg (ref 26.0–34.0)
MCHC: 32.3 g/dL (ref 30.0–36.0)
MCV: 95.3 fL (ref 80.0–100.0)
Monocytes Absolute: 0.8 10*3/uL (ref 0.1–1.0)
Monocytes Relative: 14 %
Neutro Abs: 3.9 10*3/uL (ref 1.7–7.7)
Neutrophils Relative %: 64 %
Platelets: 492 10*3/uL — ABNORMAL HIGH (ref 150–400)
RBC: 3.38 MIL/uL — ABNORMAL LOW (ref 3.87–5.11)
RDW: 14.5 % (ref 11.5–15.5)
WBC: 6.1 10*3/uL (ref 4.0–10.5)
nRBC: 0 % (ref 0.0–0.2)

## 2021-06-11 LAB — MAGNESIUM: Magnesium: 1.6 mg/dL — ABNORMAL LOW (ref 1.7–2.4)

## 2021-06-11 MED ORDER — SODIUM CHLORIDE 0.9% FLUSH
10.0000 mL | INTRAVENOUS | Status: DC | PRN
  Filled 2021-06-11: qty 10

## 2021-06-11 MED ORDER — CETUXIMAB CHEMO IV INJECTION 200 MG/100ML
250.0000 mg/m2 | Freq: Once | INTRAVENOUS | Status: AC
  Administered 2021-06-11: 400 mg via INTRAVENOUS
  Filled 2021-06-11: qty 200

## 2021-06-11 MED ORDER — MONTELUKAST SODIUM 10 MG PO TABS
10.0000 mg | ORAL_TABLET | Freq: Once | ORAL | Status: AC
  Administered 2021-06-11: 10 mg via ORAL
  Filled 2021-06-11: qty 1

## 2021-06-11 MED ORDER — HEPARIN SOD (PORK) LOCK FLUSH 100 UNIT/ML IV SOLN
500.0000 [IU] | Freq: Once | INTRAVENOUS | Status: AC
  Filled 2021-06-11: qty 5

## 2021-06-11 MED ORDER — SODIUM CHLORIDE 0.9 % IV SOLN
10.0000 mg | Freq: Once | INTRAVENOUS | Status: AC
  Administered 2021-06-11: 10 mg via INTRAVENOUS
  Filled 2021-06-11: qty 10

## 2021-06-11 MED ORDER — FAMOTIDINE 20 MG IN NS 100 ML IVPB
20.0000 mg | Freq: Once | INTRAVENOUS | Status: AC
  Administered 2021-06-11: 20 mg via INTRAVENOUS
  Filled 2021-06-11: qty 20

## 2021-06-11 MED ORDER — MAGNESIUM SULFATE 2 GM/50ML IV SOLN
2.0000 g | Freq: Once | INTRAVENOUS | Status: AC
  Administered 2021-06-11: 2 g via INTRAVENOUS
  Filled 2021-06-11: qty 50

## 2021-06-11 MED ORDER — DIPHENHYDRAMINE HCL 50 MG/ML IJ SOLN
50.0000 mg | Freq: Once | INTRAMUSCULAR | Status: AC
  Administered 2021-06-11: 50 mg via INTRAVENOUS
  Filled 2021-06-11: qty 1

## 2021-06-11 MED ORDER — SODIUM CHLORIDE 0.9% FLUSH
10.0000 mL | INTRAVENOUS | Status: AC | PRN
  Administered 2021-06-11: 10 mL via INTRAVENOUS
  Filled 2021-06-11: qty 10

## 2021-06-11 MED ORDER — SODIUM CHLORIDE 0.9 % IV SOLN
Freq: Once | INTRAVENOUS | Status: AC
  Filled 2021-06-11: qty 250

## 2021-06-11 MED ORDER — POTASSIUM CHLORIDE IN NACL 20-0.9 MEQ/L-% IV SOLN
Freq: Once | INTRAVENOUS | Status: AC
  Filled 2021-06-11: qty 1000

## 2021-06-11 MED ORDER — HEPARIN SOD (PORK) LOCK FLUSH 100 UNIT/ML IV SOLN
INTRAVENOUS | Status: AC
  Filled 2021-06-11: qty 5

## 2021-06-11 MED ORDER — HEPARIN SOD (PORK) LOCK FLUSH 100 UNIT/ML IV SOLN
500.0000 [IU] | Freq: Once | INTRAVENOUS | Status: AC | PRN
  Administered 2021-06-11: 500 [IU]
  Filled 2021-06-11: qty 5

## 2021-06-11 NOTE — Therapy (Signed)
[facility] [address] 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    Anxiety    Arthritis    Bipolar disorder (HCC)    COPD (chronic obstructive pulmonary disease) (HCC)    NO INHALERS   Depression    Dysrhythmia    H/O V TACH   Head and neck cancer ([place]) 07/29/2020   Headache    H/O MIGRAINES   Hypertension    Seizures ([place])    X1 AFTER FALL    Past Surgical History:  Procedure Laterality Date   ABDOMINAL HYSTERECTOMY     partial   BREAST SURGERY     biopsy   CARDIAC CATHETERIZATION     X 2   CARPAL TUNNEL RELEASE Right    CARPECTOMY HAND Right    CATARACT EXTRACTION W/PHACO Left 03/08/2018   Procedure: CATARACT EXTRACTION PHACO AND INTRAOCULAR LENS PLACEMENT ([place]);  Surgeon: Birder Robson, MD;  Location: [facility] ORS;  Service: Ophthalmology;  Laterality: Left;  [place] 00:55 AP% 13.5 CDE 7.52 Fluid pack lot # [id number]   CATARACT EXTRACTION W/PHACO Right 04/05/2018   Procedure: CATARACT EXTRACTION PHACO AND INTRAOCULAR LENS PLACEMENT (I[facility]);  Surgeon: Birder Robson, MD;  Location: [facility] ORS;  Service: Ophthalmology;  Laterality: Right;  [place] 00:36 AP% 15.9 CDE 5.72 Fluid pack lot # [id number]   CEREBRAL ANEURYSM REPAIR     CHOLECYSTECTOMY     COLONOSCOPY WITH PROPOFOL N/A 04/26/2019   Procedure: COLONOSCOPY WITH PROPOFOL;  Surgeon: Jonathon Bellows, MD;  Location: [facility] ENDOSCOPY;  Service: Gastroenterology;  Laterality: N/A;   PORTACATH PLACEMENT Left 08/05/2020   Procedure: INSERTION PORT-A-CATH;  Surgeon: Herbert Pun,  MD;  Location: [facility] ORS;  Service: General;  Laterality: Left;   TUBAL LIGATION      There were no vitals filed for this visit.   Subjective Assessment - 06/11/21 1007     Subjective  I done like you told me the sit and stand out of chair without my hands , and some walking and side stepping - feels good today - today is good day - did not always get 20 min of exercise in a day    Currently in Pain? No/denies               NOTE FROM Merrily Pew Borders NP - on 05/14/21 IMPRESSION: Patient was referred back to palliative care due to worsening trigeminal neuralgia.  Patient has history of chronic radiating facial pain primarily on the right side of her face.  However, patient reports that pain is significantly improved after Dr. Tasia Catchings increase her transdermal fentanyl to 50 mcg and the gabapentin to 600 mg 3 times daily.   Discussed options with patient today for future pain management strategies.  Given trigeminal neuralgia, would recommend trial of Tegretol if needed.  Patient reports that she has previously been on Tegretol for her bipolar and tolerated it well.   Patient denies other distressing symptoms today.  She continues to live at home with a roommate who provides her  with supportive care and assistance with ADLs as needed.   PLAN: -Continue current scope of treatment -Continue gabapentin 600 mg 3 times daily -Continue transdermal fentanyl 50 mcg every 72 hours and Percocet as needed for breakthrough pain -Would recommend trial of carbamazepine if needed -Referrals for rehab screening and ST -ACP/MOST form previously reviewed -Follow-up MyChart visit 2 to 3 weeks     OT SCREEN 05/28/21: Upon arrival pt sitting in transport chair Pt denies pain and any falls in the last 6 months Live with roommate in small one story house - shower inside tub - but roommate help her in and out  - shower chair and hand held shower Toilet regular height but no issues getting up Neuropathy in feet-  wear most of the time slip in Pt report she cannot stand up from chair without pushing up - and not very active during day - sit most of the time Roommate prepare food She do walk at times outside in yard - roommate or walker BERG Balance test pt score this date 40/56 - putting pt just on the border for medium risk for falling - Low risk is 41/56  Pt provided with HEP to do and pt agree: -Sit <> stand out of chair with pillow 8-10 reps 2 x day - no pushing up - side stepping at counter 1 min -2 min at time - walk some circles in house or with roommate - need to try and be active about 150 min week ( 4 x 5-7 min or 2 x 10-12 min day ) Cervical AROM in all planes - WNL - and denies pain this date Pt to follow up in 2 wks again    OT SCREEN 06/11/21: Pt in transportation chair this am. Report she done some of the exercises since she seen me 2 wks ago -and doing better- litter stronger and easier to do sit to stand . Done some walking and side stepping HEP- did not had any falls. Report today is good day - some days not as good. Redone BERG balance test with her again today - score this date 53/56 ( low risk for falling)  - compare to 2 wks ago scored 40/56( on border for med risk for falling . Pt to cont with same HEP and add some hip ABD and ext - but tapping toe - not lifting up- 10 reps each - 1 x day  Pt to try on her good days around 20 min of exercise. No need for follow up - she can contact me if any changes.                                :           Visit Diagnosis: Muscle weakness (generalized)    Problem List Patient Active Problem List   Diagnosis Date Noted   Encounter for antineoplastic immunotherapy 04/23/2021   Moderate protein-calorie malnutrition ([place]) 04/14/2021   Dehydration 10/14/2020   SVT (supraventricular tachycardia) ([place])    Hypomagnesemia    Dysphagia    Severe sepsis ([place]) 09/06/2020   Multifocal pneumonia 09/06/2020    Drug-induced constipation 08/26/2020   Hypotension 08/12/2020   Encounter for antineoplastic chemotherapy 08/08/2020   Atrial fibrillation with RVR ([place]) 08/08/2020   Elevated troponin 08/08/2020   Leukocytosis 08/08/2020   Hypokalemia 08/08/2020   SIRS (systemic inflammatory response syndrome) ([place]) 08/08/2020   Goals of care, counseling/discussion 08/06/2020  Head and neck cancer ([place]) 07/29/2020   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   Weight loss 07/29/2020   Neoplasm related pain 07/29/2020   Abscess of upper gum 09/12/2019   History of atrial fibrillation 03/22/2019   Bipolar affective disorder, current episode mixed ([place]) 06/29/2018   Cerebral edema ([place]) 06/25/2018   Hypertensive emergency 06/16/2018   Subarachnoid bleed ([place]) 06/16/2018   TIA (transient ischemic attack) 06/15/2018   Tobacco abuse 06/02/2018   COPD (chronic obstructive pulmonary disease) ([place]) 04/21/2018   URI (upper respiratory infection) 03/31/2018   Hypertension 03/31/2018   Tachycardia 02/02/2018   Rash of body 02/02/2018   Hyponatremia 02/02/2018    Rosalyn Gess OTR/L,CLT 06/11/2021, 10:09 AM  [facility] [address] [place], [place], [address] Phone: [phone]   Fax:  [phone]  Name: Shannon Schroeder MRN: [id number] Date of Birth: 1956-06-18

## 2021-06-11 NOTE — Progress Notes (Signed)
Hematology/Oncology follow up note [facility] [phone] [phone]   Patient Care Team: Pcp, No as PCP - General Rockey Situ, Kathlene November, MD as PCP - Cardiology (Cardiology) Earlie Server, MD as Consulting Physician (Oncology)  CHIEF COMPLAINTS/REASON FOR VISIT:  Follow-up for head and neck cancer  HISTORY OF PRESENTING ILLNESS:   Shannon Schroeder is a  65 y.o.  female with PMH listed below was seen in consultation at the request of ER Dr. [name] for evaluation of abnormal CT scan.  07/10/2020 she presented emergency room for evaluation of right-sided facial swelling and pain in the right side of her mouth for the past 6 weeks.  It started in her mouth with a small ulcer which progressively got worse.  She wears denture.  Not able to chew well.  She eats soft food. She was found to have right upper gum also with yellowish discharge noted. 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  Avoid piloting.,  Right orbital apex, also the right soft palate and palate and tonsil.  Furthermore there is evidence of intracranial extension along the right V2 at the foramen rotundum Small suspicious right level 1A lymph node measuring 7 mm.  Separate small indeterminate but suspicious enhancing soft tissue nodule of the left sublingual space.  Patient was referred to oncology for further evaluation management. Patient denies fever, chills, nausea vomiting diarrhea shortness of breath or cough.  Reports 10 out of 10 severe pain of right face and numbness.  She also has headache. Reports unintentional weight loss  She reports no contactable family members.  She is in the process of pointing her roommate [name] to be her power of attorney.  She also wants to record today's conversation for her roommate  [name]. Patient has 50-pack-year smoking history.  # seen by ENT Dr.Juengel and had a biopsy. Biopsy was sent to [facility] and the final pathology was positive for invasive squamous cell carcinoma, well-differentiated, keratinizing. 07/17/2020, brain MRI showed large right facial mass with right maxillary erosion involving the maxillary sinus, hard palate and pterygoid plates and muscles.  Perineural spreading along the multiple branches of the maxillary division of the right trigeminal nerve at the infraorbital canal, pterygopalatine fossa and foramen rotundum. 07/17/2020, PET scan showed Intense FDG uptake is associated with the large enhancing tumor which involves the right maxilla, right nasal cavity, right pterygoid palatine fossa, right orbital apex and right soft tissue palate and palatine tonsil. 2. Mild nonspecific FDG uptake is associated with the recently characterized suspicious right level 1A lymph node which measures 7 mm and has an SUV max of 2.12.  No signs of distant metastasis.  Aortic atherosclerosis.  Patient also reports a history of hypertension, dysrhythmia, history of V. tach.  Patient previously was on lisinopril, atenolol, amlodipine for which she is no longer taking at this point due to lack of medication coverage.  She also does not have any primary care provider. 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  On average she says she is takes 2 to 3 pills/day.  She also takes gabapentin 300 mg 3 times daily  # new onset of atrial fibrillation with rapid ventricular  response.  Patient was started on metoprolol.  Eliquis for anticoagulation.  # 09/06/2020- 09/11/2020 admitted due to A. fib, hypotension, multifocal pneumonia.  Patient was treated with IV antibiotics.  Cardiology saw the patient during admission and  added amiodarone.  Patient was discharged on 5 days course of oral antibiotics, continued on metoprolol and amiodarone for atrial fibrillation.  # Dysphagia, she declined PET tube placment.  #08/08/2020-10/11/2020 concurrent cisplatin and radiation. # 01/09/2021, PET scan showed improvement but persistent hypermetabolic activity associated with the right frontal sinus.Marked improvement in the posterior right nasopharynx with decrease in metabolic activity and tissue thickening.  Persistent but improved hypermetabolic thickened tissue in the anterior and medial right maxillary sinus.  Persistent hypermetabolic activity posterior at this level of pterygoid plate on the RIGHT. Potential new activity in the RIGHT skull base inferior medial along the sphenoid bone  # 01/14/2021, She had recurrent maxillary sinus squamous cell carcinoma and was referred to [facility] ENT for discussion of feasibility of residual disease resection.  Patient stated at [facility] for her oncology care after that. Extensive outside medical records review was performed by me via Care Everywhere.   01/27/21 MRI brain with and without contrast were notable for recurrent disease in the right pterygopalatine fossa, along V2/V3 extending into the cavernous sinus and orbital fissure, as possibleperineural spread.  01/29/2021 patient establish care with [facility] ENT Dr. Ihor Austin..  Due to the extent of disease, patient was not considered as a surgical candidate.  Dr.Blumberg sent patient to establish care with [facility] for further discussion. Patient was recommended to proceed with neoadjuvant chemotherapy  Keis regimen followed by reevaluation of surgical resection.  Patient understands that treatment goal include symptom control and increase chance of disease control and her surgical outcome.  Patient understands that if she does not respond, same treatment will be continued but with palliative intent.   Keis regimen : Cetuximab 400 mg/m2 load then 250  mg/m2 weekly, PACLItaxel 135 mg/m2 weekly,CARBOplatin AUC 2 weekly for 6 weeks Alpha gal was negative   02/06/2021  C1D1  Cetux/Taxol/Carbo, able to finish chemo without complications [id number], C1 D8 Cetux/Taxol/Carbo able to finish chemo without complications [id number] Cetux/Taxol/Carbo, she developed hypotension, nausea and flushing.  Patient was evaluated by nurse practitioner.  Was given IV fluid bolus with improvement of BP and the patient was able to finish infusion successfully.   03/04/2021, C1D22, Cetux/Taxol/Carbo Per note, patient developed hypersensitivity to carboplatin. "tolerated Cetuximab and Paclitaxel infusion without sequelae however during her Carbo infusion, she became acutely altered, momentarily unresponsive, acutely desaturated to 70s, then 80s on RA, hypotensive 81/51 with respiratory wheezing appreciated. The infusion was stopped. NS IVF were given along with pepcid & solumedrol. Patient's vital signs slowly responded but not to baseline. Pulse was thready and skin was cold and clammy. She followed some commands, slowly. A rapid response was called for the acute change in mental status and concerning vital signs. Patient was taken to ER for further evaluation and work up"   03/25/2021 C1 D29 Cetux/Taxol/Cisplatin carboplatin was switched to cisplatin Unfortunately patient developed hypersensitivity to cisplatin as well. Patient missed 5/10, 5/17 treatments due to weakness.   04/10/2019 2 repeat MRI brain with and without contrast showed evidence of disease response, however continues to have CN V involvement with extension to the skull base, unlikely to be a surgical candidate.  Her case was noted to be discussed on the head and neck tumor board at [facility]. Patient was recommended to switch to regimen with pembrolizumab and  cetuximab given the intolerance of cis-platinum  INTERVAL HISTORY Shannon Schroeder is a 65 y.o. female who has above history reviewed by me today presents for  follow up visit for management of head and neck cancer,  Patient tolerates pured food, takes nutrition supplements. Weight is stale.   Pain is partially relieved by current regimen.  She takes Percocet 10/325 every 4-6 hours as needed.  On average, patient reports taking 4-5 tablets/day.  On fentanyl patch 50 MCG Started on carbamazepine, tolerates well.  She reports taking potassium 2 tablets daily. Her partner manages her medication for her.     Review of Systems  Constitutional:  Positive for fatigue. Negative for appetite change, chills, fever and unexpected weight change.  HENT:   Negative for hearing loss, mouth sores and voice change.        Facial swelling, pain   Eyes:  Negative for eye problems.  Respiratory:  Negative for chest tightness, cough and shortness of breath.   Cardiovascular:  Negative for chest pain and palpitations.  Gastrointestinal:  Negative for abdominal distention, abdominal pain, blood in stool, constipation, nausea and vomiting.       Dysphagia  Endocrine: Negative for hot flashes.  Genitourinary:  Negative for difficulty urinating and frequency.   Musculoskeletal:  Negative for arthralgias.  Skin:  Negative for itching and rash.  Neurological:  Negative for extremity weakness and headaches.  Hematological:  Negative for adenopathy.  Psychiatric/Behavioral:  Negative for confusion.    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    Anxiety    Arthritis    Bipolar disorder (HCC)    COPD (chronic obstructive pulmonary disease) (HCC)    NO INHALERS   Depression    Dysrhythmia    H/O V TACH   Head and neck cancer ([place]) 07/29/2020   Headache    H/O MIGRAINES   Hypertension    Seizures ([place])    X1 AFTER FALL    SURGICAL HISTORY: Past Surgical History:  Procedure Laterality Date   ABDOMINAL HYSTERECTOMY     partial   BREAST SURGERY     biopsy    CARDIAC CATHETERIZATION     X 2   CARPAL TUNNEL RELEASE Right    CARPECTOMY HAND Right    CATARACT EXTRACTION W/PHACO Left 03/08/2018   Procedure: CATARACT EXTRACTION PHACO AND INTRAOCULAR LENS PLACEMENT ([place]);  Surgeon: Birder Robson, MD;  Location: [facility] ORS;  Service: Ophthalmology;  Laterality: Left;  [place] 00:55 AP% 13.5 CDE 7.52 Fluid pack lot # [id number]   CATARACT EXTRACTION W/PHACO Right 04/05/2018   Procedure: CATARACT EXTRACTION PHACO AND INTRAOCULAR LENS PLACEMENT (IOC);  Surgeon: Birder Robson, MD;  Location: [facility] ORS;  Service: Ophthalmology;  Laterality: Right;  [place] 00:36 AP% 15.9 CDE 5.72 Fluid pack lot # [id number]   CEREBRAL ANEURYSM REPAIR     CHOLECYSTECTOMY     COLONOSCOPY WITH PROPOFOL N/A 04/26/2019   Procedure: COLONOSCOPY WITH PROPOFOL;  Surgeon: Jonathon Bellows, MD;  Location: [facility] ENDOSCOPY;  Service: Gastroenterology;  Laterality: N/A;   PORTACATH PLACEMENT Left 08/05/2020   Procedure: INSERTION PORT-A-CATH;  Surgeon: Herbert Pun, MD;  Location: [facility] ORS;  Service: General;  Laterality: Left;   TUBAL LIGATION      SOCIAL HISTORY: Social History   Socioeconomic History   Marital status: Divorced    Spouse name: Not on file   Number of children: Not on file  Years of education: 49   Highest education level: Bachelor's degree (e.g., BA, AB, BS)  Occupational History   Occupation: farmer  Tobacco Use   Smoking status: Every Day    Packs/day: 0.25    Years: 50.00    Pack years: 12.50    Types: Cigarettes   Smokeless tobacco: Never  Vaping Use   Vaping Use: Never used  Substance and Sexual Activity   Alcohol use: No   Drug use: No   Sexual activity: Not Currently  Other Topics Concern   Not on file  Social History Narrative   Pt lives at [facility].    Social Determinants of Health   Financial Resource Strain: Not on file  Food Insecurity: Not on file  Transportation Needs: Not on file  Physical Activity: Not on file  Stress:  Not on file  Social Connections: Not on file  Intimate Partner Violence: Not on file    FAMILY HISTORY: Family History  Problem Relation Age of Onset   Hypertension Mother    Heart failure Mother    Hypertension Brother    Stroke Brother    Hypertension Son    Emphysema Maternal Aunt    Hypertension Paternal Aunt    Hypertension Paternal Uncle    Hypertension Maternal Grandmother    Hypertension Paternal Grandmother     ALLERGIES:  is allergic to naproxen, aspirin, belladonna alkaloids, fluoxetine, fluoxetine hcl, paroxetine hcl, phenobarbital, and prozac [fluoxetine hcl].  MEDICATIONS:  Current Outpatient Medications  Medication Sig Dispense Refill   albuterol (VENTOLIN HFA) 108 (90 Base) MCG/ACT inhaler Inhale 2 puffs into the lungs every 6 (six) hours as needed for wheezing or shortness of breath. 8 g 2   amiodarone (PACERONE) 200 MG tablet Take 1 tablet (200 mg total) by mouth daily. 90 tablet 1   apixaban (ELIQUIS) 5 MG TABS tablet Take 1 tablet (5 mg total) by mouth 2 (two) times daily. 180 tablet 3   carbamazepine (CARBATROL) 100 MG 12 hr capsule Take 1 capsule (100 mg total) by mouth 2 (two) times daily. 30 capsule 0   chlorhexidine (PERIDEX) 0.12 % solution Use as directed 15 mLs in the mouth or throat 2 (two) times daily. 473 mL 6   docusate sodium (COLACE) 100 MG capsule Take 1 capsule (100 mg total) by mouth daily. 30 capsule 1   feeding supplement, ENSURE ENLIVE, (ENSURE ENLIVE) LIQD Take 237 mLs by mouth 3 (three) times daily between meals. 237 mL 12   fentaNYL (DURAGESIC) 50 MCG/HR Place 1 patch onto the skin every 3 (three) days. 10 patch 0   fluticasone-salmeterol (ADVAIR HFA) 45-21 MCG/ACT inhaler Inhale 2 puffs into the lungs 2 (two) times daily. 1 each 3   gabapentin (NEURONTIN) 300 MG capsule Take 2 capsules (600 mg total) by mouth 3 (three) times daily. 180 capsule 1   magnesium chloride (SLOW-MAG) 64 MG TBEC SR tablet Take 1 tablet (64 mg total) by mouth 2  (two) times daily. 60 tablet 1   Melatonin 10 MG TABS Take 10 mg by mouth at bedtime as needed (sleep).     ondansetron (ZOFRAN-ODT) 8 MG disintegrating tablet Take 1 tablet (8 mg total) by mouth every 8 (eight) hours as needed for nausea or vomiting. 90 tablet 0   oxyCODONE-acetaminophen (PERCOCET) 10-325 MG tablet Take 1 tablet by mouth every 6 (six) hours as needed for pain. 120 tablet 0   polyethylene glycol powder (GLYCOLAX/MIRALAX) 17 GM/SCOOP powder Take 17 g by mouth daily as needed  for mild constipation or moderate constipation. 255 g 0   potassium chloride SA (KLOR-CON) 20 MEQ tablet Take 2 tablets (40 mEq total) by mouth daily. 60 tablet 1   furosemide (LASIX) 20 MG tablet Take 1 tablet (20 mg total) by mouth daily as needed (for lower extremity swelling). (Patient not taking: No sig reported) 90 tablet 1   mirtazapine (REMERON) 15 MG tablet Take 1 tablet (15 mg total) by mouth at bedtime. (Patient not taking: No sig reported) 30 tablet 0   Multiple Vitamins-Minerals (CENTRUM SILVER PO) Take 1 tablet by mouth daily. Gummie (Patient not taking: No sig reported)     No current facility-administered medications for this visit.   Facility-Administered Medications Ordered in Other Visits  Medication Dose Route Frequency Provider Last Rate Last Admin   0.9 % NaCl with KCl 20 mEq/ L  infusion   Intravenous Once Earlie Server, MD       cetuximab (ERBITUX) chemo infusion 400 mg  250 mg/m2 (Treatment Plan Recorded) Intravenous Once Earlie Server, MD       dexamethasone (DECADRON) 10 mg in sodium chloride 0.9 % 50 mL IVPB  10 mg Intravenous Once Earlie Server, MD       famotidine (PEPCID) IVPB 20 mg in NS 100 mL IVPB  20 mg Intravenous Once Earlie Server, MD 400 mL/hr at 06/11/21 0951 20 mg at 06/11/21 0951   heparin lock flush 100 unit/mL  500 Units Intravenous Once Earlie Server, MD       heparin lock flush 100 unit/mL  500 Units Intracatheter Once PRN Earlie Server, MD       magnesium sulfate IVPB 2 g 50 mL  2 g  Intravenous Once Earlie Server, MD       sodium chloride flush (NS) 0.9 % injection 10 mL  10 mL Intravenous PRN Earlie Server, MD   10 mL at 10/10/20 1040   sodium chloride flush (NS) 0.9 % injection 10 mL  10 mL Intravenous PRN Earlie Server, MD   10 mL at 11/13/20 0836   sodium chloride flush (NS) 0.9 % injection 10 mL  10 mL Intravenous PRN Earlie Server, MD   10 mL at 12/30/20 0838   sodium chloride flush (NS) 0.9 % injection 10 mL  10 mL Intravenous PRN Earlie Server, MD   10 mL at 01/14/21 0921   sodium chloride flush (NS) 0.9 % injection 10 mL  10 mL Intravenous PRN Earlie Server, MD   10 mL at 06/11/21 0815   sodium chloride flush (NS) 0.9 % injection 10 mL  10 mL Intracatheter PRN Earlie Server, MD         PHYSICAL EXAMINATION: ECOG PERFORMANCE STATUS: 1 - Symptomatic but completely ambulatory Vitals:   06/11/21 0825  BP: 122/62  Pulse: 93  Resp: 18  Temp: 97.8 F (36.6 C)  SpO2: 97%   Filed Weights   06/11/21 0825  Weight: 117 lb 3.2 oz (53.2 kg)    Physical Exam Constitutional:      General: She is not in acute distress.    Comments: Thin built.  Patient sits in the wheelchair  HENT:     Head: Normocephalic and atraumatic.     Mouth/Throat:     Comments: Patient wears dentures She has right palate deficiency Right facial swelling and tenderness. Eyes:     General: No scleral icterus.    Comments: Right eyelid ptosis  Cardiovascular:     Rate and Rhythm: Normal rate and regular rhythm.  Heart sounds: Normal heart sounds.  Pulmonary:     Effort: Pulmonary effort is normal. No respiratory distress.     Breath sounds: Wheezing present.  Abdominal:     General: Bowel sounds are normal. There is no distension.     Palpations: Abdomen is soft.  Musculoskeletal:        General: No deformity. Normal range of motion.     Cervical back: Normal range of motion and neck supple.  Skin:    General: Skin is warm and dry.     Findings: No erythema or rash.  Neurological:     Mental Status: She is  alert and oriented to person, place, and time. Mental status is at baseline.     Cranial Nerves: No cranial nerve deficit.     Coordination: Coordination normal.  Psychiatric:        Mood and Affect: Mood normal.    LABORATORY DATA:  I have reviewed the data as listed Lab Results  Component Value Date   WBC 6.1 06/11/2021   HGB 10.4 (L) 06/11/2021   HCT 32.2 (L) 06/11/2021   MCV 95.3 06/11/2021   PLT 492 (H) 06/11/2021   Recent Labs    07/10/20 1311 07/12/20 1204 08/08/20 0821 04/14/21 1317 04/23/21 0810 05/28/21 0809 06/04/21 0859 06/11/21 0808  NA 136 137   < > 129*   < > 133* 132* 130*  K 2.9* 4.0   < > 4.5   < > 3.3* 3.6 2.7*  CL 97* 98   < > 92*   < > 96* 96* 93*  CO2 28 27   < > 29   < > '28 28 27  '$ GLUCOSE 100* 114*   < > 106*   < > 98 128* 135*  BUN 13 8   < > 19   < > '12 10 8  '$ CREATININE 0.43* 0.42*   < > 0.46   < > 0.42* 0.35* 0.42*  CALCIUM 8.9 9.0   < > 9.0   < > 9.0 8.8* 8.5*  GFRNONAA >60 >60   < > >60   < > >60 >60 >60  GFRAA >60 >60  --   --   --   --   --   --   PROT 7.0  --    < > 7.1   < > 7.0 7.0 7.2  ALBUMIN 3.7  --    < > 3.1*   < > 3.2* 3.3* 3.1*  AST 20  --    < > 15   < > '17 21 20  '$ ALT 16  --    < > 10   < > '14 13 10  '$ ALKPHOS 106  --    < > 115   < > 114 100 119  BILITOT 0.5  --    < > <0.1*   < > 0.2* 0.5 0.4  BILIDIR  --   --   --  <0.1  --   --   --   --   IBILI  --   --   --  NOT CALCULATED  --   --   --   --    < > = values in this interval not displayed.    Iron/TIBC/Ferritin/ %Sat    Component Value Date/Time   IRON 46 09/09/2020 0938   IRON 37 05/03/2018 1826   TIBC 210 (L) 09/09/2020 0938   FERRITIN 136 09/09/2020 0938   FERRITIN 52 05/03/2018 1826  IRONPCTSAT 22 09/09/2020 0938      RADIOGRAPHIC STUDIES: I have personally reviewed the radiological images as listed and agreed with the findings in the report. No results found.     ASSESSMENT & PLAN:  1. 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[place])   2. Hypokalemia   3.  Hypomagnesemia   4. Neoplasm related pain   5. Moderate protein-calorie malnutrition ([place])   6. Encounter for antineoplastic immunotherapy    #maxillary sinus squamous cell carcinoma, s/p concurrent chemotherapy and radiation-recurrence-Unresectable  Labs are reviewed and discussed with patient.  Proceed with cycle 3 D8 etuximab. Plan to repeat MRI images after 4 cycles  #Malnutrition, dysphagia She appears having better oral intake and has gained weight.  We will hold off tube placement at this point.  Continue nutrition supplementation.  #Right facial pain /swelling neoplasm related pain/ Perineural tumor spread V1, V2, V3  Continue fentanyl patch to 50 MCG/h Q72 hours and Percocet 10/325 every 6 hours as needed.  Pain is well controlled at this point.Continue gabapentin 600 mg three times daily, add carbamazepine for trigeminal neuralgia100 mg twice daily.  Nerve block is another option if needed.  I will refer patient to establish care with Neuro oncology Dr. Mickeal Skinner  Continue acyclovir 400 mg twice daily for HSV prophylaxis. Refilled Peridex solution for her.  Recommend swish and spit twice daily  #Hyponatremia, either due to SIADH secondary to malignancy versus due to decreased oral intake. Na level is stable 130 Liberate dietary salt intake.   #Hypokalemia, K is 2.7. she will get IV KCL 67mq x 1 today. increase  potassium chloride 444m BID.   # Hypomagnesia, magnesium 1.6.  continue Slow-Mag 1 tablet daily.   IV mag 2g x 1  #Atrial fibrillation on amiodarone and metoprolol.Eliquis  All questions were answered. The patient knows to call the clinic with any problems questions or concerns.   Return of visit:   1 week for lab md cetuximab  ZhEarlie ServerMD, PhD Hematology Oncology CoRoseville Surgery Centert AlOur Lady Of The Angels Hospitalager- [id number]/08/2021

## 2021-06-11 NOTE — Patient Instructions (Signed)
[facility]  Discharge Instructions: Thank you for choosing [facility] to provide your oncology and hematology care.  If you have a lab appointment with the [facility], please go directly to the [facility] and check in at the registration area.  Wear comfortable clothing and clothing appropriate for easy access to any Portacath or PICC line.   We strive to give you quality time with your provider. You may need to reschedule your appointment if you arrive late (15 or more minutes).  Arriving late affects you and other patients whose appointments are after yours.  Also, if you miss three or more appointments without notifying the office, you may be dismissed from the clinic at the provider's discretion.      For prescription refill requests, have your pharmacy contact our office and allow 72 hours for refills to be completed.    Today you received the following chemotherapy and/or immunotherapy agents - cetuximab      To help prevent nausea and vomiting after your treatment, we encourage you to take your nausea medication as directed.  BELOW ARE SYMPTOMS THAT SHOULD BE REPORTED IMMEDIATELY: *FEVER GREATER THAN 100.4 F (38 C) OR HIGHER *CHILLS OR SWEATING *NAUSEA AND VOMITING THAT IS NOT CONTROLLED WITH YOUR NAUSEA MEDICATION *UNUSUAL SHORTNESS OF BREATH *UNUSUAL BRUISING OR BLEEDING *URINARY PROBLEMS (pain or burning when urinating, or frequent urination) *BOWEL PROBLEMS (unusual diarrhea, constipation, pain near the anus) TENDERNESS IN MOUTH AND THROAT WITH OR WITHOUT PRESENCE OF ULCERS (sore throat, sores in mouth, or a toothache) UNUSUAL RASH, SWELLING OR PAIN  UNUSUAL VAGINAL DISCHARGE OR ITCHING   Items with * indicate a potential emergency and should be followed up as soon as possible or go to the Emergency Department if any problems should occur.  Please show the CHEMOTHERAPY ALERT CARD or IMMUNOTHERAPY ALERT CARD at check-in  to the Emergency Department and triage nurse.  Should you have questions after your visit or need to cancel or reschedule your appointment, please contact [facility]  [phone] and follow the prompts.  Office hours are 8:00 a.m. to 4:30 p.m. Monday - Friday. Please note that voicemails left after 4:00 p.m. may not be returned until the following business day.  We are closed weekends and major holidays. You have access to a nurse at all times for urgent questions. Please call the main number to the clinic [phone] and follow the prompts.  For any non-urgent questions, you may also contact your provider using MyChart. We now offer e-Visits for anyone 26 and older to request care online for non-urgent symptoms. For details visit [email or web address].   Also download the MyChart app! Go to the app store, search "MyChart", open the app, select [facility], and log in with your MyChart username and password.  Due to Covid, a mask is required upon entering the hospital/clinic. If you do not have a mask, one will be given to you upon arrival. For doctor visits, patients may have 1 support person aged 19 or older with them. For treatment visits, patients cannot have anyone with them due to current Covid guidelines and our immunocompromised population.   Cetuximab injection What is this medication? CETUXIMAB (se TUX i mab) is a monoclonal antibody. It is used to treatcolorectal cancer and head and neck cancer. This medicine may be used for other purposes; ask your health care provider orpharmacist if you have questions. COMMON BRAND NAME(S): Erbitux What should I tell my care  team before I take this medication? They need to know if you have any of these conditions: heart disease history of irregular heartbeat history of low levels of calcium, magnesium, or potassium in the blood history of tick bites lung or breathing disease, like asthma red meat  allergy an unusual or allergic reaction to cetuximab, other medicines, foods, dyes, or preservatives pregnant or trying to get pregnant breast-feeding How should I use this medication? This drug is given as an infusion into a vein. It is administered in a hospitalor clinic by a specially trained health care professional. Talk to your pediatrician regarding the use of this medicine in children.Special care may be needed. Overdosage: If you think you have taken too much of this medicine contact apoison control center or emergency room at once. NOTE: This medicine is only for you. Do not share this medicine with others. What if I miss a dose? It is important not to miss your dose. Call your doctor or health careprofessional if you are unable to keep an appointment. What may interact with this medication? Interactions are not expected. This list may not describe all possible interactions. Give your health care provider a list of all the medicines, herbs, non-prescription drugs, or dietary supplements you use. Also tell them if you smoke, drink alcohol, or use illegaldrugs. Some items may interact with your medicine. What should I watch for while using this medication? Visit your doctor or health care professional for regular checks on your progress. This drug may make you feel generally unwell. This is not uncommon, as chemotherapy can affect healthy cells as well as cancer cells. Report any side effects. Continue your course of treatment even though you feel ill unlessyour doctor tells you to stop. This medicine can make you more sensitive to the sun. Keep out of the sun while taking this medicine and for 2 months after the last dose. If you cannot avoid being in the sun, wear protective clothing and use sunscreen. Do not use sunlamps or tanning beds/booths. You may need blood work done while you are taking this medicine. In some cases, you may be given additional medicines to help with side  effects.Follow all directions for their use. Call your doctor or health care professional for advice if you get a fever, chills or sore throat, or other symptoms of a cold or flu. Do not treat yourself. This drug decreases your body's ability to fight infections. Try toavoid being around people who are sick. Avoid taking products that contain aspirin, acetaminophen, ibuprofen, naproxen, or ketoprofen unless instructed by your doctor. These medicines may hide afever. Do not become pregnant while taking this medicine. Women should inform their doctor if they wish to become pregnant or think they might be pregnant. There is a potential for serious side effects to an unborn child. Use adequate birth control methods. Avoid pregnancy for at least 2 months after your last dose. Talk to your health care professional or pharmacist for more information. Do not breast-feed an infant while taking this medicine or during the 2 monthsafter your last dose. What side effects may I notice from receiving this medication? Side effects that you should report to your doctor or health care professionalas soon as possible: allergic reactions like skin rash, itching or hives, swelling of the face, lips, or tongue breathing problems changes in vision fast, irregular heartbeat feeling faint or lightheaded, falls fever, chills mouth sores redness, blistering, peeling or loosening of the skin, including inside the mouth trouble passing urine  or change in the amount of urine unusually weak or tired Side effects that usually do not require medical attention (report to yourdoctor or health care professional if they continue or are bothersome): changes in skin like acne, cracks, skin dryness constipation diarrhea headache nail changes nausea, vomiting stomach upset weight loss This list may not describe all possible side effects. Call your doctor for medical advice about side effects. You may report side effects to FDA  at1-800-FDA-1088. Where should I keep my medication? This drug is given in a hospital or clinic and will not be stored at home. NOTE: This sheet is a summary. It may not cover all possible information. If you have questions about this medicine, talk to your doctor, pharmacist, orhealth care provider.  2022 Elsevier/Gold Standard (2019-09-20 22:56:04)

## 2021-06-18 ENCOUNTER — Ambulatory Visit
Admission: RE | Admit: 2021-06-18 | Discharge: 2021-06-18 | Disposition: A | Discharge status: Home / Self Care | Payer: Self-pay | Source: Ambulatory Visit | Attending: Oncology | Admitting: Oncology

## 2021-06-18 ENCOUNTER — Inpatient Hospital Stay: Payer: Medicare Other

## 2021-06-18 ENCOUNTER — Inpatient Hospital Stay (HOSPITAL_BASED_OUTPATIENT_CLINIC_OR_DEPARTMENT_OTHER): Payer: Medicare Other | Admitting: Oncology

## 2021-06-18 ENCOUNTER — Encounter: Payer: Self-pay | Admitting: Oncology

## 2021-06-18 VITALS — BP 123/67 | HR 83 | Temp 97.5°F | Wt 116.6 lb

## 2021-06-18 DIAGNOSIS — C310 Malignant neoplasm of maxillary sinus: Secondary | ICD-10-CM

## 2021-06-18 DIAGNOSIS — E440 Moderate protein-calorie malnutrition: Secondary | ICD-10-CM

## 2021-06-18 DIAGNOSIS — C760 Malignant neoplasm of head, face and neck: Secondary | ICD-10-CM

## 2021-06-18 DIAGNOSIS — Z5112 Encounter for antineoplastic immunotherapy: Secondary | ICD-10-CM

## 2021-06-18 DIAGNOSIS — E876 Hypokalemia: Secondary | ICD-10-CM

## 2021-06-18 DIAGNOSIS — G893 Neoplasm related pain (acute) (chronic): Secondary | ICD-10-CM

## 2021-06-18 DIAGNOSIS — Z5111 Encounter for antineoplastic chemotherapy: Secondary | ICD-10-CM | POA: Diagnosis not present

## 2021-06-18 DIAGNOSIS — Z8679 Personal history of other diseases of the circulatory system: Secondary | ICD-10-CM

## 2021-06-18 LAB — CBC WITH DIFFERENTIAL/PLATELET
Abs Immature Granulocytes: 0.06 10*3/uL (ref 0.00–0.07)
Basophils Absolute: 0.1 10*3/uL (ref 0.0–0.1)
Basophils Relative: 1 %
Eosinophils Absolute: 0.4 10*3/uL (ref 0.0–0.5)
Eosinophils Relative: 5 %
HCT: 33.9 % — ABNORMAL LOW (ref 36.0–46.0)
Hemoglobin: 10.7 g/dL — ABNORMAL LOW (ref 12.0–15.0)
Immature Granulocytes: 1 %
Lymphocytes Relative: 10 %
Lymphs Abs: 0.9 10*3/uL (ref 0.7–4.0)
MCH: 30.3 pg (ref 26.0–34.0)
MCHC: 31.6 g/dL (ref 30.0–36.0)
MCV: 96 fL (ref 80.0–100.0)
Monocytes Absolute: 1 10*3/uL (ref 0.1–1.0)
Monocytes Relative: 11 %
Neutro Abs: 6.7 10*3/uL (ref 1.7–7.7)
Neutrophils Relative %: 72 %
Platelets: 507 10*3/uL — ABNORMAL HIGH (ref 150–400)
RBC: 3.53 MIL/uL — ABNORMAL LOW (ref 3.87–5.11)
RDW: 14.6 % (ref 11.5–15.5)
WBC: 9.2 10*3/uL (ref 4.0–10.5)
nRBC: 0 % (ref 0.0–0.2)

## 2021-06-18 LAB — COMPREHENSIVE METABOLIC PANEL
ALT: 11 U/L (ref 0–44)
AST: 16 U/L (ref 15–41)
Albumin: 3.1 g/dL — ABNORMAL LOW (ref 3.5–5.0)
Alkaline Phosphatase: 109 U/L (ref 38–126)
Anion gap: 10 (ref 5–15)
BUN: 6 mg/dL — ABNORMAL LOW (ref 8–23)
CO2: 25 mmol/L (ref 22–32)
Calcium: 8.7 mg/dL — ABNORMAL LOW (ref 8.9–10.3)
Chloride: 96 mmol/L — ABNORMAL LOW (ref 98–111)
Creatinine, Ser: 0.44 mg/dL (ref 0.44–1.00)
GFR, Estimated: >60 mL/min (ref >60)
Glucose, Bld: 106 mg/dL — ABNORMAL HIGH (ref 70–99)
Potassium: 3.2 mmol/L — ABNORMAL LOW (ref 3.5–5.1)
Sodium: 131 mmol/L — ABNORMAL LOW (ref 135–145)
Total Bilirubin: 0.2 mg/dL — ABNORMAL LOW (ref 0.3–1.2)
Total Protein: 7.1 g/dL (ref 6.5–8.1)

## 2021-06-18 LAB — MAGNESIUM: Magnesium: 1.7 mg/dL (ref 1.7–2.4)

## 2021-06-18 MED ORDER — FAMOTIDINE 20 MG IN NS 100 ML IVPB
20.0000 mg | Freq: Once | INTRAVENOUS | Status: AC
  Administered 2021-06-18: 20 mg via INTRAVENOUS
  Filled 2021-06-18: qty 20

## 2021-06-18 MED ORDER — FENTANYL 75 MCG/HR TD PT72: 1.0000 | MEDICATED_PATCH | TRANSDERMAL | 0 refills | Status: DC

## 2021-06-18 MED ORDER — HEPARIN SOD (PORK) LOCK FLUSH 100 UNIT/ML IV SOLN
500.0000 [IU] | Freq: Once | INTRAVENOUS | Status: AC
  Administered 2021-06-18: 500 [IU] via INTRAVENOUS
  Filled 2021-06-18: qty 5

## 2021-06-18 MED ORDER — SODIUM CHLORIDE 0.9 % IV SOLN
Freq: Once | INTRAVENOUS | Status: AC
  Filled 2021-06-18: qty 250

## 2021-06-18 MED ORDER — MONTELUKAST SODIUM 10 MG PO TABS
10.0000 mg | ORAL_TABLET | Freq: Once | ORAL | Status: AC
  Administered 2021-06-18: 10 mg via ORAL
  Filled 2021-06-18: qty 1

## 2021-06-18 MED ORDER — DIPHENHYDRAMINE HCL 50 MG/ML IJ SOLN
50.0000 mg | Freq: Once | INTRAMUSCULAR | Status: AC
  Administered 2021-06-18: 50 mg via INTRAVENOUS
  Filled 2021-06-18: qty 1

## 2021-06-18 MED ORDER — SODIUM CHLORIDE 0.9% FLUSH
10.0000 mL | Freq: Once | INTRAVENOUS | Status: AC
  Administered 2021-06-18: 10 mL via INTRAVENOUS
  Filled 2021-06-18: qty 10

## 2021-06-18 MED ORDER — HEPARIN SOD (PORK) LOCK FLUSH 100 UNIT/ML IV SOLN
INTRAVENOUS | Status: AC
  Filled 2021-06-18: qty 5

## 2021-06-18 MED ORDER — SODIUM CHLORIDE 0.9 % IV SOLN
10.0000 mg | Freq: Once | INTRAVENOUS | Status: AC
  Administered 2021-06-18: 10 mg via INTRAVENOUS
  Filled 2021-06-18: qty 10

## 2021-06-18 MED ORDER — CETUXIMAB CHEMO IV INJECTION 200 MG/100ML
250.0000 mg/m2 | Freq: Once | INTRAVENOUS | Status: AC
  Administered 2021-06-18: 400 mg via INTRAVENOUS
  Filled 2021-06-18: qty 200

## 2021-06-18 MED ORDER — OXYCODONE-ACETAMINOPHEN 10-325 MG PO TABS: 1.0000 | ORAL_TABLET | Freq: Four times a day (QID) | ORAL | 0 refills | Status: DC | PRN

## 2021-06-18 NOTE — Patient Instructions (Signed)
[facility]  Discharge Instructions: Thank you for choosing [facility] to provide your oncology and hematology care.  If you have a lab appointment with the [facility], please go directly to the [facility] and check in at the registration area.  Wear comfortable clothing and clothing appropriate for easy access to any Portacath or PICC line.   We strive to give you quality time with your provider. You may need to reschedule your appointment if you arrive late (15 or more minutes).  Arriving late affects you and other patients whose appointments are after yours.  Also, if you miss three or more appointments without notifying the office, you may be dismissed from the clinic at the provider's discretion.      For prescription refill requests, have your pharmacy contact our office and allow 72 hours for refills to be completed.    Today you received the following chemotherapy and/or immunotherapy agents ERBITUX       To help prevent nausea and vomiting after your treatment, we encourage you to take your nausea medication as directed.  BELOW ARE SYMPTOMS THAT SHOULD BE REPORTED IMMEDIATELY: *FEVER GREATER THAN 100.4 F (38 C) OR HIGHER *CHILLS OR SWEATING *NAUSEA AND VOMITING THAT IS NOT CONTROLLED WITH YOUR NAUSEA MEDICATION *UNUSUAL SHORTNESS OF BREATH *UNUSUAL BRUISING OR BLEEDING *URINARY PROBLEMS (pain or burning when urinating, or frequent urination) *BOWEL PROBLEMS (unusual diarrhea, constipation, pain near the anus) TENDERNESS IN MOUTH AND THROAT WITH OR WITHOUT PRESENCE OF ULCERS (sore throat, sores in mouth, or a toothache) UNUSUAL RASH, SWELLING OR PAIN  UNUSUAL VAGINAL DISCHARGE OR ITCHING   Items with * indicate a potential emergency and should be followed up as soon as possible or go to the Emergency Department if any problems should occur.  Please show the CHEMOTHERAPY ALERT CARD or IMMUNOTHERAPY ALERT CARD at check-in  to the Emergency Department and triage nurse.  Should you have questions after your visit or need to cancel or reschedule your appointment, please contact [facility]  [phone] and follow the prompts.  Office hours are 8:00 a.m. to 4:30 p.m. Monday - Friday. Please note that voicemails left after 4:00 p.m. may not be returned until the following business day.  We are closed weekends and major holidays. You have access to a nurse at all times for urgent questions. Please call the main number to the clinic [phone] and follow the prompts.  For any non-urgent questions, you may also contact your provider using MyChart. We now offer e-Visits for anyone 30 and older to request care online for non-urgent symptoms. For details visit [email or web address].   Also download the MyChart app! Go to the app store, search "MyChart", open the app, select , and log in with your MyChart username and password.  Due to Covid, a mask is required upon entering the hospital/clinic. If you do not have a mask, one will be given to you upon arrival. For doctor visits, patients may have 1 support person aged 36 or older with them. For treatment visits, patients cannot have anyone with them due to current Covid guidelines and our immunocompromised population.   Cetuximab injection What is this medication? CETUXIMAB (se TUX i mab) is a monoclonal antibody. It is used to treatcolorectal cancer and head and neck cancer. This medicine may be used for other purposes; ask your health care provider orpharmacist if you have questions. COMMON BRAND NAME(S): Erbitux What should I tell my care  team before I take this medication? They need to know if you have any of these conditions: heart disease history of irregular heartbeat history of low levels of calcium, magnesium, or potassium in the blood history of tick bites lung or breathing disease, like asthma red meat  allergy an unusual or allergic reaction to cetuximab, other medicines, foods, dyes, or preservatives pregnant or trying to get pregnant breast-feeding How should I use this medication? This drug is given as an infusion into a vein. It is administered in a hospitalor clinic by a specially trained health care professional. Talk to your pediatrician regarding the use of this medicine in children.Special care may be needed. Overdosage: If you think you have taken too much of this medicine contact apoison control center or emergency room at once. NOTE: This medicine is only for you. Do not share this medicine with others. What if I miss a dose? It is important not to miss your dose. Call your doctor or health careprofessional if you are unable to keep an appointment. What may interact with this medication? Interactions are not expected. This list may not describe all possible interactions. Give your health care provider a list of all the medicines, herbs, non-prescription drugs, or dietary supplements you use. Also tell them if you smoke, drink alcohol, or use illegaldrugs. Some items may interact with your medicine. What should I watch for while using this medication? Visit your doctor or health care professional for regular checks on your progress. This drug may make you feel generally unwell. This is not uncommon, as chemotherapy can affect healthy cells as well as cancer cells. Report any side effects. Continue your course of treatment even though you feel ill unlessyour doctor tells you to stop. This medicine can make you more sensitive to the sun. Keep out of the sun while taking this medicine and for 2 months after the last dose. If you cannot avoid being in the sun, wear protective clothing and use sunscreen. Do not use sunlamps or tanning beds/booths. You may need blood work done while you are taking this medicine. In some cases, you may be given additional medicines to help with side  effects.Follow all directions for their use. Call your doctor or health care professional for advice if you get a fever, chills or sore throat, or other symptoms of a cold or flu. Do not treat yourself. This drug decreases your body's ability to fight infections. Try toavoid being around people who are sick. Avoid taking products that contain aspirin, acetaminophen, ibuprofen, naproxen, or ketoprofen unless instructed by your doctor. These medicines may hide afever. Do not become pregnant while taking this medicine. Women should inform their doctor if they wish to become pregnant or think they might be pregnant. There is a potential for serious side effects to an unborn child. Use adequate birth control methods. Avoid pregnancy for at least 2 months after your last dose. Talk to your health care professional or pharmacist for more information. Do not breast-feed an infant while taking this medicine or during the 2 monthsafter your last dose. What side effects may I notice from receiving this medication? Side effects that you should report to your doctor or health care professionalas soon as possible: allergic reactions like skin rash, itching or hives, swelling of the face, lips, or tongue breathing problems changes in vision fast, irregular heartbeat feeling faint or lightheaded, falls fever, chills mouth sores redness, blistering, peeling or loosening of the skin, including inside the mouth trouble passing urine  or change in the amount of urine unusually weak or tired Side effects that usually do not require medical attention (report to yourdoctor or health care professional if they continue or are bothersome): changes in skin like acne, cracks, skin dryness constipation diarrhea headache nail changes nausea, vomiting stomach upset weight loss This list may not describe all possible side effects. Call your doctor for medical advice about side effects. You may report side effects to FDA  at1-800-FDA-1088. Where should I keep my medication? This drug is given in a hospital or clinic and will not be stored at home. NOTE: This sheet is a summary. It may not cover all possible information. If you have questions about this medicine, talk to your doctor, pharmacist, orhealth care provider.  2022 Elsevier/Gold Standard (2019-09-20 22:56:04)

## 2021-06-18 NOTE — Progress Notes (Signed)
Hematology/Oncology follow up note [facility] [phone] [phone]   Patient Care Team: Pcp, No as PCP - General Rockey Situ, Kathlene November, MD as PCP - Cardiology (Cardiology) Earlie Server, MD as Consulting Physician (Oncology)  CHIEF COMPLAINTS/REASON FOR VISIT:  Follow-up for head and neck cancer  HISTORY OF PRESENTING ILLNESS:   Shannon Schroeder is a  65 y.o.  female with PMH listed below was seen in consultation at the request of ER Dr. [name] for evaluation of abnormal CT scan.  07/10/2020 she presented emergency room for evaluation of right-sided facial swelling and pain in the right side of her mouth for the past 6 weeks.  It started in her mouth with a small ulcer which progressively got worse.  She wears denture.  Not able to chew well.  She eats soft food. She was found to have right upper gum also with yellowish discharge noted. 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  Avoid piloting.,  Right orbital apex, also the right soft palate and palate and tonsil.  Furthermore there is evidence of intracranial extension along the right V2 at the foramen rotundum Small suspicious right level 1A lymph node measuring 7 mm.  Separate small indeterminate but suspicious enhancing soft tissue nodule of the left sublingual space.  Patient was referred to oncology for further evaluation management. Patient denies fever, chills, nausea vomiting diarrhea shortness of breath or cough.  Reports 10 out of 10 severe pain of right face and numbness.  She also has headache. Reports unintentional weight loss  She reports no contactable family members.  She is in the process of pointing her roommate [name] to be her power of attorney.  She also wants to record today's conversation for her roommate  [name]. Patient has 50-pack-year smoking history.  # seen by ENT Dr.Juengel and had a biopsy. Biopsy was sent to [facility] and the final pathology was positive for invasive squamous cell carcinoma, well-differentiated, keratinizing. 07/17/2020, brain MRI showed large right facial mass with right maxillary erosion involving the maxillary sinus, hard palate and pterygoid plates and muscles.  Perineural spreading along the multiple branches of the maxillary division of the right trigeminal nerve at the infraorbital canal, pterygopalatine fossa and foramen rotundum. 07/17/2020, PET scan showed Intense FDG uptake is associated with the large enhancing tumor which involves the right maxilla, right nasal cavity, right pterygoid palatine fossa, right orbital apex and right soft tissue palate and palatine tonsil. 2. Mild nonspecific FDG uptake is associated with the recently characterized suspicious right level 1A lymph node which measures 7 mm and has an SUV max of 2.12.  No signs of distant metastasis.  Aortic atherosclerosis.  Patient also reports a history of hypertension, dysrhythmia, history of V. tach.  Patient previously was on lisinopril, atenolol, amlodipine for which she is no longer taking at this point due to lack of medication coverage.  She also does not have any primary care provider. 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  On average she says she is takes 2 to 3 pills/day.  She also takes gabapentin 300 mg 3 times daily  # new onset of atrial fibrillation with rapid ventricular  response.  Patient was started on metoprolol.  Eliquis for anticoagulation.  # 09/06/2020- 09/11/2020 admitted due to A. fib, hypotension, multifocal pneumonia.  Patient was treated with IV antibiotics.  Cardiology saw the patient during admission and  added amiodarone.  Patient was discharged on 5 days course of oral antibiotics, continued on metoprolol and amiodarone for atrial fibrillation.  # Dysphagia, she declined PET tube placment.  #08/08/2020-10/11/2020 concurrent cisplatin and radiation. # 01/09/2021, PET scan showed improvement but persistent hypermetabolic activity associated with the right frontal sinus.Marked improvement in the posterior right nasopharynx with decrease in metabolic activity and tissue thickening.  Persistent but improved hypermetabolic thickened tissue in the anterior and medial right maxillary sinus.  Persistent hypermetabolic activity posterior at this level of pterygoid plate on the RIGHT. Potential new activity in the RIGHT skull base inferior medial along the sphenoid bone  # 01/14/2021, She had recurrent maxillary sinus squamous cell carcinoma and was referred to [facility] ENT for discussion of feasibility of residual disease resection.  Patient stated at [facility] for her oncology care after that. Extensive outside medical records review was performed by me via Care Everywhere.   01/27/21 MRI brain with and without contrast were notable for recurrent disease in the right pterygopalatine fossa, along V2/V3 extending into the cavernous sinus and orbital fissure, as possibleperineural spread.  01/29/2021 patient establish care with [facility]NT Dr. Ihor Austin..  Due to the extent of disease, patient was not considered as a surgical candidate.  Dr.Blumberg sent patient to establish care with [facility] for further discussion. Patient was recommended to proceed with neoadjuvant chemotherapy  Keis regimen followed by reevaluation of surgical resection.  Patient understands that treatment goal include symptom control and increase chance of disease control and her surgical outcome.  Patient understands that if she does not respond, same treatment will be continued but with palliative intent.   Keis regimen : Cetuximab 400 mg/m2 load then 250  mg/m2 weekly, PACLItaxel 135 mg/m2 weekly,CARBOplatin AUC 2 weekly for 6 weeks Alpha gal was negative   02/06/2021  C1D1  Cetux/Taxol/Carbo, able to finish chemo without complications [id number], C1 D8 Cetux/Taxol/Carbo able to finish chemo without complications [id number] Cetux/Taxol/Carbo, she developed hypotension, nausea and flushing.  Patient was evaluated by nurse practitioner.  Was given IV fluid bolus with improvement of BP and the patient was able to finish infusion successfully.   03/04/2021, C1D22, Cetux/Taxol/Carbo Per note, patient developed hypersensitivity to carboplatin. "tolerated Cetuximab and Paclitaxel infusion without sequelae however during her Carbo infusion, she became acutely altered, momentarily unresponsive, acutely desaturated to 70s, then 80s on RA, hypotensive 81/51 with respiratory wheezing appreciated. The infusion was stopped. NS IVF were given along with pepcid & solumedrol. Patient's vital signs slowly responded but not to baseline. Pulse was thready and skin was cold and clammy. She followed some commands, slowly. A rapid response was called for the acute change in mental status and concerning vital signs. Patient was taken to ER for further evaluation and work up"   03/25/2021 C1 D29 Cetux/Taxol/Cisplatin carboplatin was switched to cisplatin Unfortunately patient developed hypersensitivity to cisplatin as well. Patient missed 5/10, 5/17 treatments due to weakness.   04/10/2019 2 repeat MRI brain with and without contrast showed evidence of disease response, however continues to have CN V involvement with extension to the skull base, unlikely to be a surgical candidate.  Her case was noted to be discussed on the head and neck tumor board at [facility]. Patient was recommended to switch to regimen with pembrolizumab and  cetuximab given the intolerance of cis-platinum  INTERVAL HISTORY Shannon Schroeder is a 65 y.o. female who has above history reviewed by me today presents for  follow up visit for management of head and neck cancer,  Patient tolerates pured food, takes nutrition supplements.   Weight is relatively stable Continues to have right facial pain.  On gabapentin, carbamazepine, fentanyl patch 50 MCG/h every 72 hours, patient takes 5 Percocet per day and still feel pain is not well controlled.  Patient takes potassium 40 mEq twice daily    Review of Systems  Constitutional:  Positive for fatigue. Negative for appetite change, chills, fever and unexpected weight change.  HENT:   Negative for hearing loss, mouth sores and voice change.        Facial swelling, pain   Eyes:  Negative for eye problems.  Respiratory:  Negative for chest tightness, cough and shortness of breath.   Cardiovascular:  Negative for chest pain and palpitations.  Gastrointestinal:  Negative for abdominal distention, abdominal pain, blood in stool, constipation, nausea and vomiting.       Dysphagia  Endocrine: Negative for hot flashes.  Genitourinary:  Negative for difficulty urinating and frequency.   Musculoskeletal:  Negative for arthralgias.  Skin:  Negative for itching and rash.  Neurological:  Negative for extremity weakness and headaches.  Hematological:  Negative for adenopathy.  Psychiatric/Behavioral:  Negative for confusion.    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    Anxiety    Arthritis    Bipolar disorder (HCC)    COPD (chronic obstructive pulmonary disease) (HCC)    NO INHALERS   Depression    Dysrhythmia    H/O V TACH   Head and neck cancer ([place]) 07/29/2020   Headache    H/O MIGRAINES   Hypertension    Seizures ([place])    X1 AFTER FALL    SURGICAL HISTORY: Past Surgical History:  Procedure Laterality Date   ABDOMINAL HYSTERECTOMY     partial   BREAST SURGERY     biopsy   CARDIAC CATHETERIZATION     X 2   CARPAL TUNNEL RELEASE Right     CARPECTOMY HAND Right    CATARACT EXTRACTION W/PHACO Left 03/08/2018   Procedure: CATARACT EXTRACTION PHACO AND INTRAOCULAR LENS PLACEMENT ([place]);  Surgeon: Birder Robson, MD;  Location: [facility] ORS;  Service: Ophthalmology;  Laterality: Left;  [place] 00:55 AP% 13.5 CDE 7.52 Fluid pack lot # [id number]   CATARACT EXTRACTION W/PHACO Right 04/05/2018   Procedure: CATARACT EXTRACTION PHACO AND INTRAOCULAR LENS PLACEMENT ([facility]);  Surgeon: Birder Robson, MD;  Location: [facility] ORS;  Service: Ophthalmology;  Laterality: Right;  [place] 00:36 AP% 15.9 CDE 5.72 Fluid pack lot # [id number]   CEREBRAL ANEURYSM REPAIR     CHOLECYSTECTOMY     COLONOSCOPY WITH PROPOFOL N/A 04/26/2019   Procedure: COLONOSCOPY WITH PROPOFOL;  Surgeon: Jonathon Bellows, MD;  Location: [facility] ENDOSCOPY;  Service: Gastroenterology;  Laterality: N/A;   PORTACATH PLACEMENT Left 08/05/2020   Procedure: INSERTION PORT-A-CATH;  Surgeon: Herbert Pun, MD;  Location: [facility] ORS;  Service: General;  Laterality: Left;   TUBAL LIGATION      SOCIAL HISTORY: Social History   Socioeconomic History   Marital status: Divorced    Spouse name: Not on file   Number of children: Not on file   Years of education: 16   Highest education level: Bachelor's degree (  e.g., BA, AB, BS)  Occupational History   Occupation: farmer  Tobacco Use   Smoking status: Every Day    Packs/day: 0.25    Years: 50.00    Pack years: 12.50    Types: Cigarettes   Smokeless tobacco: Never  Vaping Use   Vaping Use: Never used  Substance and Sexual Activity   Alcohol use: No   Drug use: No   Sexual activity: Not Currently  Other Topics Concern   Not on file  Social History Narrative   Pt lives at [facility].    Social Determinants of Health   Financial Resource Strain: Not on file  Food Insecurity: Not on file  Transportation Needs: Not on file  Physical Activity: Not on file  Stress: Not on file  Social Connections: Not on file  Intimate Partner  Violence: Not on file    FAMILY HISTORY: Family History  Problem Relation Age of Onset   Hypertension Mother    Heart failure Mother    Hypertension Brother    Stroke Brother    Hypertension Son    Emphysema Maternal Aunt    Hypertension Paternal Aunt    Hypertension Paternal Uncle    Hypertension Maternal Grandmother    Hypertension Paternal Grandmother     ALLERGIES:  is allergic to naproxen, aspirin, belladonna alkaloids, fluoxetine, fluoxetine hcl, paroxetine hcl, phenobarbital, and prozac [fluoxetine hcl].  MEDICATIONS:  Current Outpatient Medications  Medication Sig Dispense Refill   albuterol (VENTOLIN HFA) 108 (90 Base) MCG/ACT inhaler Inhale 2 puffs into the lungs every 6 (six) hours as needed for wheezing or shortness of breath. 8 g 2   amiodarone (PACERONE) 200 MG tablet Take 1 tablet (200 mg total) by mouth daily. 90 tablet 1   apixaban (ELIQUIS) 5 MG TABS tablet Take 1 tablet (5 mg total) by mouth 2 (two) times daily. 180 tablet 3   carbamazepine (CARBATROL) 100 MG 12 hr capsule Take 1 capsule (100 mg total) by mouth 2 (two) times daily. 30 capsule 0   chlorhexidine (PERIDEX) 0.12 % solution Use as directed 15 mLs in the mouth or throat 2 (two) times daily. 473 mL 6   docusate sodium (COLACE) 100 MG capsule Take 1 capsule (100 mg total) by mouth daily. 30 capsule 1   feeding supplement, ENSURE ENLIVE, (ENSURE ENLIVE) LIQD Take 237 mLs by mouth 3 (three) times daily between meals. 237 mL 12   fentaNYL (DURAGESIC) 75 MCG/HR Place 1 patch onto the skin every 3 (three) days. 10 patch 0   fluticasone-salmeterol (ADVAIR HFA) 45-21 MCG/ACT inhaler Inhale 2 puffs into the lungs 2 (two) times daily. 1 each 3   gabapentin (NEURONTIN) 300 MG capsule Take 2 capsules (600 mg total) by mouth 3 (three) times daily. 180 capsule 1   magnesium chloride (SLOW-MAG) 64 MG TBEC SR tablet Take 1 tablet (64 mg total) by mouth 2 (two) times daily. 60 tablet 1   Melatonin 10 MG TABS Take 10 mg  by mouth at bedtime as needed (sleep).     ondansetron (ZOFRAN-ODT) 8 MG disintegrating tablet Take 1 tablet (8 mg total) by mouth every 8 (eight) hours as needed for nausea or vomiting. 90 tablet 0   polyethylene glycol powder (GLYCOLAX/MIRALAX) 17 GM/SCOOP powder Take 17 g by mouth daily as needed for mild constipation or moderate constipation. 255 g 0   potassium chloride SA (KLOR-CON) 20 MEQ tablet Take 2 tablets (40 mEq total) by mouth daily. 60 tablet 1   furosemide (LASIX)  20 MG tablet Take 1 tablet (20 mg total) by mouth daily as needed (for lower extremity swelling). (Patient not taking: No sig reported) 90 tablet 1   mirtazapine (REMERON) 15 MG tablet Take 1 tablet (15 mg total) by mouth at bedtime. (Patient not taking: No sig reported) 30 tablet 0   Multiple Vitamins-Minerals (CENTRUM SILVER PO) Take 1 tablet by mouth daily. Gummie (Patient not taking: No sig reported)     oxyCODONE-acetaminophen (PERCOCET) 10-325 MG tablet Take 1 tablet by mouth every 6 (six) hours as needed for pain. 120 tablet 0   No current facility-administered medications for this visit.   Facility-Administered Medications Ordered in Other Visits  Medication Dose Route Frequency Provider Last Rate Last Admin   heparin lock flush 100 unit/mL  500 Units Intravenous Once Earlie Server, MD       sodium chloride flush (NS) 0.9 % injection 10 mL  10 mL Intravenous PRN Earlie Server, MD   10 mL at 10/10/20 1040   sodium chloride flush (NS) 0.9 % injection 10 mL  10 mL Intravenous PRN Earlie Server, MD   10 mL at 11/13/20 0836   sodium chloride flush (NS) 0.9 % injection 10 mL  10 mL Intravenous PRN Earlie Server, MD   10 mL at 12/30/20 0838   sodium chloride flush (NS) 0.9 % injection 10 mL  10 mL Intravenous PRN Earlie Server, MD   10 mL at 01/14/21 0921   sodium chloride flush (NS) 0.9 % injection 10 mL  10 mL Intravenous PRN Earlie Server, MD   10 mL at 06/11/21 0815     PHYSICAL EXAMINATION: ECOG PERFORMANCE STATUS: 1 - Symptomatic but  completely ambulatory Vitals:   06/18/21 0904  BP: 123/67  Pulse: 83  Temp: (!) 97.5 F (36.4 C)  SpO2: 100%   Filed Weights   06/18/21 0904  Weight: 116 lb 9.6 oz (52.9 kg)    Physical Exam Constitutional:      General: She is not in acute distress.    Comments: Thin built.  Patient sits in the wheelchair  HENT:     Head: Normocephalic and atraumatic.     Mouth/Throat:     Comments: Patient wears dentures She has right palate deficiency Right facial swelling and tenderness. Eyes:     General: No scleral icterus.    Comments: Right eyelid ptosis  Cardiovascular:     Rate and Rhythm: Normal rate and regular rhythm.     Heart sounds: Normal heart sounds.  Pulmonary:     Effort: Pulmonary effort is normal. No respiratory distress.     Breath sounds: Wheezing present.  Abdominal:     General: Bowel sounds are normal. There is no distension.     Palpations: Abdomen is soft.  Musculoskeletal:        General: No deformity. Normal range of motion.     Cervical back: Normal range of motion and neck supple.  Skin:    General: Skin is warm and dry.     Findings: No erythema or rash.  Neurological:     Mental Status: She is alert and oriented to person, place, and time. Mental status is at baseline.     Cranial Nerves: No cranial nerve deficit.     Coordination: Coordination normal.  Psychiatric:        Mood and Affect: Mood normal.    LABORATORY DATA:  I have reviewed the data as listed Lab Results  Component Value Date   WBC 9.2  06/18/2021   HGB 10.7 (L) 06/18/2021   HCT 33.9 (L) 06/18/2021   MCV 96.0 06/18/2021   PLT 507 (H) 06/18/2021   Recent Labs    07/10/20 1311 07/12/20 1204 08/08/20 0821 04/14/21 1317 04/23/21 0810 06/04/21 0859 06/11/21 0808 06/18/21 0847  NA 136 137   < > 129*   < > 132* 130* 131*  K 2.9* 4.0   < > 4.5   < > 3.6 2.7* 3.2*  CL 97* 98   < > 92*   < > 96* 93* 96*  CO2 28 27   < > 29   < > '28 27 25  '$ GLUCOSE 100* 114*   < > 106*   <  > 128* 135* 106*  BUN 13 8   < > 19   < > 10 8 6*  CREATININE 0.43* 0.42*   < > 0.46   < > 0.35* 0.42* 0.44  CALCIUM 8.9 9.0   < > 9.0   < > 8.8* 8.5* 8.7*  GFRNONAA >60 >60   < > >60   < > >60 >60 >60  GFRAA >60 >60  --   --   --   --   --   --   PROT 7.0  --    < > 7.1   < > 7.0 7.2 7.1  ALBUMIN 3.7  --    < > 3.1*   < > 3.3* 3.1* 3.1*  AST 20  --    < > 15   < > '21 20 16  '$ ALT 16  --    < > 10   < > '13 10 11  '$ ALKPHOS 106  --    < > 115   < > 100 119 109  BILITOT 0.5  --    < > <0.1*   < > 0.5 0.4 0.2*  BILIDIR  --   --   --  <0.1  --   --   --   --   IBILI  --   --   --  NOT CALCULATED  --   --   --   --    < > = values in this interval not displayed.    Iron/TIBC/Ferritin/ %Sat    Component Value Date/Time   IRON 46 09/09/2020 0938   IRON 37 05/03/2018 1826   TIBC 210 (L) 09/09/2020 0938   FERRITIN 136 09/09/2020 0938   FERRITIN 52 05/03/2018 1826   IRONPCTSAT 22 09/09/2020 0938      RADIOGRAPHIC STUDIES: I have personally reviewed the radiological images as listed and agreed with the findings in the report. No results found.     ASSESSMENT & PLAN:  1. Moderate protein-calorie malnutrition ([place])   2. Encounter for antineoplastic immunotherapy   3. 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[place])   4. Hypomagnesemia   5. Hypokalemia   6. Head and neck cancer (HCC)    #maxillary sinus squamous cell carcinoma, s/p concurrent chemotherapy and radiation-recurrence-Unresectable  Labs reviewed and discussed with patient. Proceed with cycle 3 day 15 cetuximab. Marginal plan is to proceed with MRI images after 4 cycles.  #Neoplasm related pain patient continues to have severe right facial neoplasm related pain.I will obtain MRI brain with and without contrast next 1 to 2 weeks for evaluation of disease status.  Increase fentanyl patch to 75 MCG/h every 72 hours, continue Percocet 10/325 every 4-6 hours as needed.  Refills were sent. Continue gabapentin 600 mg 3 times daily.  Continue  carbamazepine 100 mg twice daily.  She has an appointment with neuro-oncology Dr. Mickeal Skinner.  Appreciate his recommendation. She previously declined additional radiation and now agrees to see Dr. Baruch Gouty and have a discussion.  I will coordinate her to see Dr. Baruch Gouty   #Malnutrition, dysphagia She appears having better oral intake and has gained weight.  We will hold off tube placement at this point.  Continue nutrition supplementation.  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 Refilled Peridex solution for her.  Recommend swish and spit twice daily  #Hyponatremia, either due to SIADH secondary to malignancy versus due to decreased oral intake. Na level is stable 131 Liberate dietary salt intake.   #Hypokalemia, K is 3.2 continue potassium chloride 59mq BID.   # Hypomagnesia, magnesium 1.7.  continue Slow-Mag 1 tablet daily.    #Atrial fibrillation on amiodarone and metoprolol.Eliquis  All questions were answered. The patient knows to call the clinic with any problems questions or concerns.   Return of visit:   1 week for lab md cycle 4 Keytruda cetuximab  ZEarlie Server MD, PhD Hematology Oncology CBapt[facility] Msat AAvenues Surgical CenterPager- 3IE:[id number]/17/2022

## 2021-06-20 ENCOUNTER — Inpatient Hospital Stay (HOSPITAL_BASED_OUTPATIENT_CLINIC_OR_DEPARTMENT_OTHER): Payer: Medicare Other | Admitting: Internal Medicine

## 2021-06-20 ENCOUNTER — Encounter: Payer: Self-pay | Admitting: Internal Medicine

## 2021-06-20 DIAGNOSIS — G509 Disorder of trigeminal nerve, unspecified: Secondary | ICD-10-CM

## 2021-06-20 DIAGNOSIS — Z5111 Encounter for antineoplastic chemotherapy: Secondary | ICD-10-CM | POA: Diagnosis not present

## 2021-06-20 MED ORDER — OXCARBAZEPINE 300 MG PO TABS: 300.0000 mg | ORAL_TABLET | Freq: Two times a day (BID) | ORAL | 1 refills | Status: DC

## 2021-06-20 NOTE — Progress Notes (Signed)
New patient evaluation with Dr. Vaslow. 

## 2021-06-20 NOTE — Progress Notes (Signed)
[facility] [place], [place] [address] [phone]   New Patient Evaluation  Date of Service: 06/20/21 Patient Name: Shannon Schroeder Patient MRN: [id number] Patient DOB: [birth year] Provider: Ventura Sellers, MD  Identifying Statement:  Shannon Schroeder is a 65 y.o. 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    Referring Provider: Earlie Server, MD [place],  [place] [id number]  Primary Cancer:  Oncologic History: Oncology History  Head and neck cancer ([place])  07/29/2020 Initial Diagnosis   Head and neck cancer ([place])   08/08/2020 - 08/08/2020 Chemotherapy   The patient had dexamethasone (DECADRON) 4 MG tablet, 8 mg, Oral, Daily, 0 of 1 cycle, Start date: --, End date: -- palonosetron (ALOXI) injection 0.25 mg, 0.25 mg, Intravenous,  Once, 0 of 3 cycles CISplatin (PLATINOL) 166 mg in sodium chloride 0.9 % 500 mL chemo infusion, 100 mg/m2, Intravenous,  Once, 0 of 3 cycles fosaprepitant (EMEND) 150 mg in sodium chloride 0.9 % 145 mL IVPB, 150 mg, Intravenous,  Once, 0 of 3 cycles   for chemotherapy treatment.     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  She describes ongoing pain involving the right side of her face since initial diagnosis  with squamous sinus cancer in 2021.  At present, she experiences "many bouts" per day of lancinating shooting pain in all aspects of the right face, including the forehead.  She also describes a chronic duller sensation along her nose and cheek.  For symptoms, she has utilized fentanyl patch, gabapentin, and tegretol.  Tegretol led to intolerable mood changes and was discontinued.  She plan to obtain a follow up MRI study at the end of the month, and will be meeting with Dr. Donella Stade as well to discuss radiation.    Medications: Current Outpatient Medications on File Prior to Visit  Medication Sig Dispense Refill   albuterol (VENTOLIN HFA) 108 (90 Base) MCG/ACT inhaler Inhale 2 puffs into the lungs every 6 (six) hours as needed for wheezing or shortness of breath. 8 g 2   amiodarone (PACERONE) 200 MG tablet Take 1 tablet (200 mg total) by mouth daily. 90 tablet 1   apixaban (ELIQUIS) 5 MG TABS tablet Take 1 tablet (5 mg total) by mouth 2 (two) times daily. 180 tablet 3   carbamazepine (CARBATROL) 100 MG 12 hr capsule Take 1 capsule (100 mg total) by mouth 2 (two) times daily. 30 capsule 0   chlorhexidine (PERIDEX) 0.12 % solution Use as directed 15 mLs in the mouth or throat 2 (two) times daily. 473 mL 6   docusate sodium (COLACE) 100 MG capsule Take 1 capsule (100 mg total) by mouth daily. 30 capsule 1   feeding supplement, ENSURE ENLIVE, (ENSURE ENLIVE) LIQD Take 237  mLs by mouth 3 (three) times daily between meals. 237 mL 12   fentaNYL (DURAGESIC) 75 MCG/HR Place 1 patch onto the skin every 3 (three) days. 10 patch 0   fluticasone-salmeterol (ADVAIR HFA) 45-21 MCG/ACT inhaler Inhale 2 puffs into the lungs 2 (two) times daily. 1 each 3   gabapentin (NEURONTIN) 300 MG capsule Take 2 capsules (600 mg total) by mouth 3 (three) times daily. 180 capsule 1   magnesium chloride (SLOW-MAG) 64 MG TBEC SR tablet Take 1 tablet (64 mg total) by mouth 2 (two) times daily. 60 tablet 1   Melatonin 10 MG TABS Take  10 mg by mouth at bedtime as needed (sleep).     ondansetron (ZOFRAN-ODT) 8 MG disintegrating tablet Take 1 tablet (8 mg total) by mouth every 8 (eight) hours as needed for nausea or vomiting. 90 tablet 0   oxyCODONE-acetaminophen (PERCOCET) 10-325 MG tablet Take 1 tablet by mouth every 6 (six) hours as needed for pain. 120 tablet 0   polyethylene glycol powder (GLYCOLAX/MIRALAX) 17 GM/SCOOP powder Take 17 g by mouth daily as needed for mild constipation or moderate constipation. 255 g 0   potassium chloride SA (KLOR-CON) 20 MEQ tablet Take 2 tablets (40 mEq total) by mouth daily. 60 tablet 1   furosemide (LASIX) 20 MG tablet Take 1 tablet (20 mg total) by mouth daily as needed (for lower extremity swelling). (Patient not taking: No sig reported) 90 tablet 1   mirtazapine (REMERON) 15 MG tablet Take 1 tablet (15 mg total) by mouth at bedtime. (Patient not taking: No sig reported) 30 tablet 0   Multiple Vitamins-Minerals (CENTRUM SILVER PO) Take 1 tablet by mouth daily. Gummie (Patient not taking: No sig reported)     [DISCONTINUED] prochlorperazine (COMPAZINE) 10 MG tablet Take 1 tablet (10 mg total) by mouth every 6 (six) hours as needed (Nausea or vomiting). 30 tablet 1   Current Facility-Administered Medications on File Prior to Visit  Medication Dose Route Frequency Provider Last Rate Last Admin   heparin lock flush 100 unit/mL  500 Units Intravenous Once Earlie Server, MD       sodium chloride flush (NS) 0.9 % injection 10 mL  10 mL Intravenous PRN Earlie Server, MD   10 mL at 10/10/20 1040   sodium chloride flush (NS) 0.9 % injection 10 mL  10 mL Intravenous PRN Earlie Server, MD   10 mL at 11/13/20 0836   sodium chloride flush (NS) 0.9 % injection 10 mL  10 mL Intravenous PRN Earlie Server, MD   10 mL at 12/30/20 0838   sodium chloride flush (NS) 0.9 % injection 10 mL  10 mL Intravenous PRN Earlie Server, MD   10 mL at 01/14/21 0921   sodium chloride flush (NS) 0.9 % injection 10 mL  10 mL Intravenous PRN Earlie Server,  MD   10 mL at 06/11/21 0815    Allergies:  Allergies  Allergen Reactions   Naproxen Swelling, Rash and Other (See Comments)   Aspirin Swelling and Other (See Comments)   Belladonna Alkaloids Rash   Fluoxetine Hives and Rash   Fluoxetine Hcl Other (See Comments) and Rash   Paroxetine Hcl Rash and Other (See Comments)   Phenobarbital Rash and Other (See Comments)   Prozac [Fluoxetine Hcl] Rash   Past Medical History:  Past Medical History:  Diagnosis Date   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    Anxiety    Arthritis  Bipolar disorder ([place])    COPD (chronic obstructive pulmonary disease) ([facility])    NO INHALERS   Depression    Dysrhythmia    H/O V TACH   Head and neck cancer ([place]) 07/29/2020   Headache    H/O MIGRAINES   Hypertension    Seizures ([place])    X1 AFTER FALL   Past Surgical History:  Past Surgical History:  Procedure Laterality Date   ABDOMINAL HYSTERECTOMY     partial   BREAST SURGERY     biopsy   CARDIAC CATHETERIZATION     X 2   CARPAL TUNNEL RELEASE Right    CARPECTOMY HAND Right    CATARACT EXTRACTION W/PHACO Left 03/08/2018   Procedure: CATARACT EXTRACTION PHACO AND INTRAOCULAR LENS PLACEMENT ([place]);  Surgeon: Birder Robson, MD;  Location: [facility] ORS;  Service: Ophthalmology;  Laterality: Left;  [place] 00:55 AP% 13.5 CDE 7.52 Fluid pack lot # [id number]   CATARACT EXTRACTION W/PHACO Right 04/05/2018   Procedure: CATARACT EXTRACTION PHACO AND INTRAOCULAR LENS PLACEMENT ([facility]);  Surgeon: Birder Robson, MD;  Location: [facility] ORS;  Service: Ophthalmology;  Laterality: Right;  [place] 00:36 AP% 15.9 CDE 5.72 Fluid pack lot # [id number]   CEREBRAL ANEURYSM REPAIR     CHOLECYSTECTOMY     COLONOSCOPY WITH PROPOFOL N/A 04/26/2019   Procedure: COLONOSCOPY WITH PROPOFOL;  Surgeon: Jonathon Bellows, MD;  Location: [facility]COPY;  Service: Gastroenterology;  Laterality: N/A;   PORTACATH PLACEMENT Left 08/05/2020   Procedure:  INSERTION PORT-A-CATH;  Surgeon: Herbert Pun, MD;  Location: [facility] ORS;  Service: General;  Laterality: Left;   TUBAL LIGATION     Social History:  Social History   Socioeconomic History   Marital status: Divorced    Spouse name: Not on file   Number of children: Not on file   Years of education: 16   Highest education level: Bachelor's degree (e.g., BA, AB, BS)  Occupational History   Occupation: farmer  Tobacco Use   Smoking status: Every Day    Packs/day: 0.25    Years: 50.00    Pack years: 12.50    Types: Cigarettes   Smokeless tobacco: Never  Vaping Use   Vaping Use: Never used  Substance and Sexual Activity   Alcohol use: No   Drug use: No   Sexual activity: Not Currently  Other Topics Concern   Not on file  Social History Narrative   Pt lives at [facility].    Social Determinants of Health   Financial Resource Strain: Not on file  Food Insecurity: Not on file  Transportation Needs: Not on file  Physical Activity: Not on file  Stress: Not on file  Social Connections: Not on file  Intimate Partner Violence: Not on file   Family History:  Family History  Problem Relation Age of Onset   Hypertension Mother    Heart failure Mother    Hypertension Brother    Stroke Brother    Hypertension Son    Emphysema Maternal Aunt    Hypertension Paternal Aunt    Hypertension Paternal Uncle    Hypertension Maternal Grandmother    Hypertension Paternal Grandmother     Review of Systems: Constitutional: Doesn't report fevers, chills or abnormal weight loss Eyes: Doesn't report blurriness of vision Ears, nose, mouth, throat, and face: Doesn't report sore throat Respiratory: Doesn't report cough, dyspnea or wheezes Cardiovascular: Doesn't report palpitation, chest discomfort  Gastrointestinal:  Doesn't report nausea, constipation, diarrhea GU: Doesn't report incontinence Skin: Doesn't report skin rashes Neurological:  Per HPI Musculoskeletal: Doesn't  report joint pain Behavioral/Psych: Doesn't report anxiety  Physical Exam: Vitals:   06/20/21 0956  BP: 129/79  Pulse: 90  Resp: 16  Temp: (!) 96.6 F (35.9 C)   KPS: 70. General: Alert, cooperative, pleasant, in no acute distress Head: Normal EENT: No conjunctival injection or scleral icterus.  Lungs: Resp effort normal Cardiac: Regular rate Abdomen: Non-distended abdomen Skin: No rashes cyanosis or petechiae. Extremities: No clubbing or edema  Neurologic Exam: Mental Status: Awake, alert, attentive to examiner. Oriented to self and environment. Language is fluent with intact comprehension.  Cranial Nerves: Visual acuity is grossly normal. Visual fields are full. Extra-ocular movements intact. No ptosis. Face is structurally asymmetric with dysarthria. Motor: Tone and bulk are normal. Power is full in both arms and legs. Reflexes are symmetric, no pathologic reflexes present.  Sensory: Stocking neuropathic changes Gait: Deferred   Labs: I have reviewed the data as listed    Component Value Date/Time   NA 131 (L) 06/18/2021 0847   NA 135 03/15/2019 1201   K 3.2 (L) 06/18/2021 0847   K 4.2 06/26/2015 0000   CL 96 (L) 06/18/2021 0847   CL 91 05/25/2016 0000   CO2 25 06/18/2021 0847   CO2 25 08/19/2016 0000   GLUCOSE 106 (H) 06/18/2021 0847   BUN 6 (L) 06/18/2021 0847   BUN 12 03/15/2019 1201   BUN 11 12/18/2016 0000   CREATININE 0.44 06/18/2021 0847   CREATININE 0.67 12/18/2016 0000   CALCIUM 8.7 (L) 06/18/2021 0847   CALCIUM 8.9 12/18/2016 0000   PROT 7.1 06/18/2021 0847   PROT 6.8 03/15/2019 1201   ALBUMIN 3.1 (L) 06/18/2021 0847   ALBUMIN 4.5 03/15/2019 1201   ALBUMIN 4.5 12/18/2016 0000   AST 16 06/18/2021 0847   AST 15 12/18/2016 0000   ALT 11 06/18/2021 0847   ALT 11 08/19/2016 0000   ALKPHOS 109 06/18/2021 0847   ALKPHOS 117 12/18/2016 0000   BILITOT 0.2 (L) 06/18/2021 0847   BILITOT <0.2 03/15/2019 1201   BILITOT 0.3 12/18/2016 0000   GFRNONAA >60  06/18/2021 0847   GFRNONAA 95 12/18/2016 0000   GFRAA >60 07/12/2020 1204   GFRAA 110 12/18/2016 0000   Lab Results  Component Value Date   WBC 9.2 06/18/2021   NEUTROABS 6.7 06/18/2021   HGB 10.7 (L) 06/18/2021   HCT 33.9 (L) 06/18/2021   MCV 96.0 06/18/2021   PLT 507 (H) 06/18/2021    Imaging: CLINICAL DATA:  Right-sided facial pain for 4-5 months. Facial tumor. Possible perineural spread.   EXAM: MRI HEAD WITHOUT AND WITH CONTRAST   TECHNIQUE: Multiplanar, multiecho pulse sequences of the brain and surrounding structures were obtained without and with intravenous contrast.   CONTRAST:  90m GADAVIST GADOBUTROL 1 MMOL/ML IV SOLN   COMPARISON:  Maxillofacial CT 07/10/2020   FINDINGS: Brain: No acute infarct, acute hemorrhage or extra-axial collection. Multifocal hyperintense T2-weighted signal within the white matter. Normal volume of CSF spaces. No chronic microhemorrhage. Normal midline structures. There is abnormal thickening and contrast enhancement of the maxillary branch of the right trigeminal nerve within the right Meckel cave and foramen rotundum. Abnormal enhancement extends to the pterygopalatine fossa and along the lateral posterior aspect of the right maxillary sinus. There is also abnormal enhancement extending into the right pterygoid muscles.   Vascular: Normal flow voids.   Skull and upper cervical spine: Large right facial mass erodes through the right maxilla including the floor of the maxillary sinus, the  right half of the hard palate and the right maxillary alveolar process.   Sinuses/Orbits: Erosive mass involving the right maxillary sinus. Otherwise, the paranasal sinuses are clear. There is abnormal enhancement along the right infraorbital nerve.   Other: None   IMPRESSION: 1. Large right facial mass with right maxillary erosion involving the maxillary sinus, hard palate and pterygoid plates and muscles. 2. Perineural spread along  multiple branches of the maxillary division of the right trigeminal nerve at the infraorbital canal, pterygopalatine fossa and foramen rotundum.     Electronically Signed   By: Ulyses Jarred M.D.   On: 07/17/2020 20:04   Assessment/Plan Trigeminal neuropathy  LARIZA ZELTNER presents with clinical and radiographic syndrome localizing to the distribution of right trigeminal nerve, V1-3 branches.  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   She continues on chemotherapy (currently cetuximab), and consideration will be given to localized radiotherapy given refractory nature of symptom burden.  We are agreeable with trial of palliative radiation, and are eager to hear Dr. Donette Larry impression on the case.    For symptoms, we recommended trial of Oxcarbezapine, starting with '300mg'$  BID.  This can be uptitrated if initial response is obtained.  We reviewed potential side effects including cognitive impairment, dizziness, nausea and vomiting, rash.   We spent twenty additional minutes teaching regarding the natural history, biology, and historical experience in the treatment of neurologic complications of cancer.   We appreciate the opportunity to participate in the care of Shannon Schroeder.  We will follow up with her in 4 weeks via phone, following MRI scan, to assess response to today's intervention.  We are also happy to see her sooner if interested.  All questions were answered. The patient knows to call the clinic with any problems, questions or concerns. No barriers to learning were detected.  The total time spent in the encounter was 40 minutes and more than 50% was on counseling and review of test results   Ventura Sellers, MD Medical Director of Neuro-Oncology [facility] [place] 06/20/21 10:49 AM

## 2021-06-24 ENCOUNTER — Other Ambulatory Visit: Payer: Self-pay | Admitting: *Deleted

## 2021-06-24 ENCOUNTER — Other Ambulatory Visit: Payer: Self-pay | Admitting: Radiation Therapy

## 2021-06-25 ENCOUNTER — Other Ambulatory Visit: Payer: Self-pay | Admitting: *Deleted

## 2021-06-25 ENCOUNTER — Ambulatory Visit
Admission: RE | Admit: 2021-06-25 | Discharge: 2021-06-25 | Disposition: A | Discharge status: Home / Self Care | Payer: Medicare Other | Source: Ambulatory Visit | Attending: Radiation Oncology | Admitting: Radiation Oncology

## 2021-06-25 ENCOUNTER — Inpatient Hospital Stay: Payer: Medicare Other

## 2021-06-25 ENCOUNTER — Encounter: Payer: Self-pay | Admitting: Oncology

## 2021-06-25 ENCOUNTER — Inpatient Hospital Stay (HOSPITAL_BASED_OUTPATIENT_CLINIC_OR_DEPARTMENT_OTHER): Payer: Medicare Other | Admitting: Oncology

## 2021-06-25 VITALS — BP 109/64 | HR 79 | Temp 98.2°F | Resp 17 | Wt 118.0 lb

## 2021-06-25 DIAGNOSIS — C310 Malignant neoplasm of maxillary sinus: Secondary | ICD-10-CM

## 2021-06-25 DIAGNOSIS — C760 Malignant neoplasm of head, face and neck: Secondary | ICD-10-CM

## 2021-06-25 DIAGNOSIS — G893 Neoplasm related pain (acute) (chronic): Secondary | ICD-10-CM

## 2021-06-25 DIAGNOSIS — Z5112 Encounter for antineoplastic immunotherapy: Secondary | ICD-10-CM

## 2021-06-25 DIAGNOSIS — Z5111 Encounter for antineoplastic chemotherapy: Secondary | ICD-10-CM | POA: Diagnosis not present

## 2021-06-25 DIAGNOSIS — E876 Hypokalemia: Secondary | ICD-10-CM

## 2021-06-25 DIAGNOSIS — Z8679 Personal history of other diseases of the circulatory system: Secondary | ICD-10-CM

## 2021-06-25 DIAGNOSIS — G509 Disorder of trigeminal nerve, unspecified: Secondary | ICD-10-CM

## 2021-06-25 DIAGNOSIS — E440 Moderate protein-calorie malnutrition: Secondary | ICD-10-CM

## 2021-06-25 LAB — CBC WITH DIFFERENTIAL/PLATELET
Abs Immature Granulocytes: 0.03 10*3/uL (ref 0.00–0.07)
Basophils Absolute: 0.1 10*3/uL (ref 0.0–0.1)
Basophils Relative: 1 %
Eosinophils Absolute: 0.5 10*3/uL (ref 0.0–0.5)
Eosinophils Relative: 7 %
HCT: 31.5 % — ABNORMAL LOW (ref 36.0–46.0)
Hemoglobin: 10.3 g/dL — ABNORMAL LOW (ref 12.0–15.0)
Immature Granulocytes: 0 %
Lymphocytes Relative: 10 %
Lymphs Abs: 0.8 10*3/uL (ref 0.7–4.0)
MCH: 30.8 pg (ref 26.0–34.0)
MCHC: 32.7 g/dL (ref 30.0–36.0)
MCV: 94.3 fL (ref 80.0–100.0)
Monocytes Absolute: 1.2 10*3/uL — ABNORMAL HIGH (ref 0.1–1.0)
Monocytes Relative: 15 %
Neutro Abs: 5.3 10*3/uL (ref 1.7–7.7)
Neutrophils Relative %: 67 %
Platelets: 454 10*3/uL — ABNORMAL HIGH (ref 150–400)
RBC: 3.34 MIL/uL — ABNORMAL LOW (ref 3.87–5.11)
RDW: 14.6 % (ref 11.5–15.5)
WBC: 7.9 10*3/uL (ref 4.0–10.5)
nRBC: 0 % (ref 0.0–0.2)

## 2021-06-25 LAB — COMPREHENSIVE METABOLIC PANEL
ALT: 11 U/L (ref 0–44)
AST: 16 U/L (ref 15–41)
Albumin: 3.2 g/dL — ABNORMAL LOW (ref 3.5–5.0)
Alkaline Phosphatase: 107 U/L (ref 38–126)
Anion gap: 8 (ref 5–15)
BUN: 12 mg/dL (ref 8–23)
CO2: 29 mmol/L (ref 22–32)
Calcium: 8.4 mg/dL — ABNORMAL LOW (ref 8.9–10.3)
Chloride: 92 mmol/L — ABNORMAL LOW (ref 98–111)
Creatinine, Ser: 0.39 mg/dL — ABNORMAL LOW (ref 0.44–1.00)
GFR, Estimated: >60 mL/min (ref >60)
Glucose, Bld: 108 mg/dL — ABNORMAL HIGH (ref 70–99)
Potassium: 3.1 mmol/L — ABNORMAL LOW (ref 3.5–5.1)
Sodium: 129 mmol/L — ABNORMAL LOW (ref 135–145)
Total Bilirubin: 0.3 mg/dL (ref 0.3–1.2)
Total Protein: 7.1 g/dL (ref 6.5–8.1)

## 2021-06-25 LAB — MAGNESIUM: Magnesium: 1.8 mg/dL (ref 1.7–2.4)

## 2021-06-25 MED ORDER — MONTELUKAST SODIUM 10 MG PO TABS
10.0000 mg | ORAL_TABLET | Freq: Every day | ORAL | Status: DC
  Administered 2021-06-25: 10 mg via ORAL

## 2021-06-25 MED ORDER — CETUXIMAB CHEMO IV INJECTION 200 MG/100ML
250.0000 mg/m2 | Freq: Once | INTRAVENOUS | Status: DC
  Filled 2021-06-25: qty 200

## 2021-06-25 MED ORDER — DIPHENHYDRAMINE HCL 50 MG/ML IJ SOLN
50.0000 mg | Freq: Once | INTRAMUSCULAR | Status: AC
  Administered 2021-06-25: 50 mg via INTRAVENOUS
  Filled 2021-06-25: qty 1

## 2021-06-25 MED ORDER — HEPARIN SOD (PORK) LOCK FLUSH 100 UNIT/ML IV SOLN
INTRAVENOUS | Status: AC
  Filled 2021-06-25: qty 5

## 2021-06-25 MED ORDER — MONTELUKAST SODIUM 10 MG PO TABS: 10.0000 mg | ORAL_TABLET | Freq: Once | ORAL | Status: DC

## 2021-06-25 MED ORDER — SODIUM CHLORIDE 0.9% FLUSH
10.0000 mL | INTRAVENOUS | Status: DC | PRN
  Administered 2021-06-25: 10 mL via INTRAVENOUS
  Filled 2021-06-25: qty 10

## 2021-06-25 MED ORDER — POTASSIUM CHLORIDE IN NACL 20-0.9 MEQ/L-% IV SOLN
Freq: Once | INTRAVENOUS | Status: AC
  Filled 2021-06-25: qty 1000

## 2021-06-25 MED ORDER — SODIUM CHLORIDE 0.9 % IV SOLN
200.0000 mg | Freq: Once | INTRAVENOUS | Status: AC
  Administered 2021-06-25: 200 mg via INTRAVENOUS
  Filled 2021-06-25: qty 8

## 2021-06-25 MED ORDER — SODIUM CHLORIDE 0.9 % IV SOLN
Freq: Once | INTRAVENOUS | Status: AC
  Filled 2021-06-25: qty 250

## 2021-06-25 MED ORDER — DIPHENHYDRAMINE HCL 50 MG/ML IJ SOLN: 50.0000 mg | Freq: Once | INTRAMUSCULAR | Status: DC

## 2021-06-25 MED ORDER — FAMOTIDINE 20 MG IN NS 100 ML IVPB
20.0000 mg | Freq: Once | INTRAVENOUS | Status: DC
  Administered 2021-06-25: 20 mg via INTRAVENOUS
  Filled 2021-06-25: qty 20
  Filled 2021-06-25: qty 100

## 2021-06-25 MED ORDER — SODIUM CHLORIDE 0.9 % IV SOLN
10.0000 mg | Freq: Once | INTRAVENOUS | Status: AC
  Administered 2021-06-25: 10 mg via INTRAVENOUS
  Filled 2021-06-25: qty 1

## 2021-06-25 MED ORDER — SODIUM CHLORIDE 0.9 % IV SOLN
200.0000 mg | Freq: Once | INTRAVENOUS | Status: DC
  Filled 2021-06-25: qty 8

## 2021-06-25 MED ORDER — SODIUM CHLORIDE 0.9 % IV SOLN
10.0000 mg | Freq: Once | INTRAVENOUS | Status: DC
  Filled 2021-06-25: qty 1

## 2021-06-25 MED ORDER — CETUXIMAB CHEMO IV INJECTION 200 MG/100ML
400.0000 mg | Freq: Once | INTRAVENOUS | Status: AC
  Administered 2021-06-25: 400 mg via INTRAVENOUS
  Filled 2021-06-25: qty 200

## 2021-06-25 MED ORDER — FAMOTIDINE 20 MG IN NS 100 ML IVPB
20.0000 mg | Freq: Once | INTRAVENOUS | Status: AC
Start: 2021-06-25 — End: 2021-06-25
  Filled 2021-06-25: qty 100

## 2021-06-25 MED ORDER — HEPARIN SOD (PORK) LOCK FLUSH 100 UNIT/ML IV SOLN
500.0000 [IU] | Freq: Once | INTRAVENOUS | Status: AC
  Administered 2021-06-25: 500 [IU] via INTRAVENOUS
  Filled 2021-06-25: qty 5

## 2021-06-25 NOTE — Progress Notes (Signed)
Hematology/Oncology follow up note [facility] [phone] [phone]   Patient Care Team: Pcp, No as PCP - General Rockey Situ, Kathlene November, MD as PCP - Cardiology (Cardiology) Earlie Server, MD as Consulting Physician (Oncology)  CHIEF COMPLAINTS/REASON FOR VISIT:  Follow-up for head and neck cancer  HISTORY OF PRESENTING ILLNESS:   Shannon Schroeder is a  65 y.o.  female with PMH listed below was seen in consultation at the request of ER Dr. [name] for evaluation of abnormal CT scan.  07/10/2020 she presented emergency room for evaluation of right-sided facial swelling and pain in the right side of her mouth for the past 6 weeks.  It started in her mouth with a small ulcer which progressively got worse.  She wears denture.  Not able to chew well.  She eats soft food. She was found to have right upper gum also with yellowish discharge noted. 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  Avoid piloting.,  Right orbital apex, also the right soft palate and palate and tonsil.  Furthermore there is evidence of intracranial extension along the right V2 at the foramen rotundum Small suspicious right level 1A lymph node measuring 7 mm.  Separate small indeterminate but suspicious enhancing soft tissue nodule of the left sublingual space.  Patient was referred to oncology for further evaluation management. Patient denies fever, chills, nausea vomiting diarrhea shortness of breath or cough.  Reports 10 out of 10 severe pain of right face and numbness.  She also has headache. Reports unintentional weight loss  She reports no contactable family members.  She is in the process of pointing her roommate [name] to be her power of attorney.  She also wants to record today's conversation for her roommate  [name]. Patient has 50-pack-year smoking history.  # seen by ENT Dr.Juengel and had a biopsy. Biopsy was sent to [facility] and the final pathology was positive for invasive squamous cell carcinoma, well-differentiated, keratinizing. 07/17/2020, brain MRI showed large right facial mass with right maxillary erosion involving the maxillary sinus, hard palate and pterygoid plates and muscles.  Perineural spreading along the multiple branches of the maxillary division of the right trigeminal nerve at the infraorbital canal, pterygopalatine fossa and foramen rotundum. 07/17/2020, PET scan showed Intense FDG uptake is associated with the large enhancing tumor which involves the right maxilla, right nasal cavity, right pterygoid palatine fossa, right orbital apex and right soft tissue palate and palatine tonsil. 2. Mild nonspecific FDG uptake is associated with the recently characterized suspicious right level 1A lymph node which measures 7 mm and has an SUV max of 2.12.  No signs of distant metastasis.  Aortic atherosclerosis.  Patient also reports a history of hypertension, dysrhythmia, history of V. tach.  Patient previously was on lisinopril, atenolol, amlodipine for which she is no longer taking at this point due to lack of medication coverage.  She also does not have any primary care provider. 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  On average she says she is takes 2 to 3 pills/day.  She also takes gabapentin 300 mg 3 times daily  # new onset of atrial fibrillation with rapid ventricular  response.  Patient was started on metoprolol.  Eliquis for anticoagulation.  # 09/06/2020- 09/11/2020 admitted due to A. fib, hypotension, multifocal pneumonia.  Patient was treated with IV antibiotics.  Cardiology saw the patient during admission and  added amiodarone.  Patient was discharged on 5 days course of oral antibiotics, continued on metoprolol and amiodarone for atrial fibrillation.  # Dysphagia, she declined PET tube placment.  #08/08/2020-10/11/2020 concurrent cisplatin and radiation. # 01/09/2021, PET scan showed improvement but persistent hypermetabolic activity associated with the right frontal sinus.Marked improvement in the posterior right nasopharynx with decrease in metabolic activity and tissue thickening.  Persistent but improved hypermetabolic thickened tissue in the anterior and medial right maxillary sinus.  Persistent hypermetabolic activity posterior at this level of pterygoid plate on the RIGHT. Potential new activity in the RIGHT skull base inferior medial along the sphenoid bone  # 01/14/2021, She had recurrent maxillary sinus squamous cell carcinoma and was referred to [facility] ENT for discussion of feasibility of residual disease resection.  Patient stated at [facility] for her oncology care after that. Extensive outside medical records review was performed by me via Care Everywhere.   01/27/21 MRI brain with and without contrast were notable for recurrent disease in the right pterygopalatine fossa, along V2/V3 extending into the cavernous sinus and orbital fissure, as possibleperineural spread.  01/29/2021 patient establish care with [facility] ENT Dr. Ihor Austin..  Due to the extent of disease, patient was not considered as a surgical candidate.  Dr.Blumberg sent patient to establish care with [facility] for further discussion. Patient was recommended to proceed with neoadjuvant chemotherapy  Keis regimen followed by reevaluation of surgical resection.  Patient understands that treatment goal include symptom control and increase chance of disease control and her surgical outcome.  Patient understands that if she does not respond, same treatment will be continued but with palliative intent.   Keis regimen : Cetuximab 400 mg/m2 load then 250  mg/m2 weekly, PACLItaxel 135 mg/m2 weekly,CARBOplatin AUC 2 weekly for 6 weeks Alpha gal was negative   02/06/2021  C1D1  Cetux/Taxol/Carbo, able to finish chemo without complications [id number], C1 D8 Cetux/Taxol/Carbo able to finish chemo without complications [id number] Cetux/Taxol/Carbo, she developed hypotension, nausea and flushing.  Patient was evaluated by nurse practitioner.  Was given IV fluid bolus with improvement of BP and the patient was able to finish infusion successfully.   03/04/2021, C1D22, Cetux/Taxol/Carbo Per note, patient developed hypersensitivity to carboplatin. "tolerated Cetuximab and Paclitaxel infusion without sequelae however during her Carbo infusion, she became acutely altered, momentarily unresponsive, acutely desaturated to 70s, then 80s on RA, hypotensive 81/51 with respiratory wheezing appreciated. The infusion was stopped. NS IVF were given along with pepcid & solumedrol. Patient's vital signs slowly responded but not to baseline. Pulse was thready and skin was cold and clammy. She followed some commands, slowly. A rapid response was called for the acute change in mental status and concerning vital signs. Patient was taken to ER for further evaluation and work up"   03/25/2021 C1 D29 Cetux/Taxol/Cisplatin carboplatin was switched to cisplatin Unfortunately patient developed hypersensitivity to cisplatin as well. Patient missed 5/10, 5/17 treatments due to weakness.   04/10/2019 2 repeat MRI brain with and without contrast showed evidence of disease response, however continues to have CN V involvement with extension to the skull base, unlikely to be a surgical candidate.  Her case was noted to be discussed on the head and neck tumor board at [facility]. Patient was recommended to switch to regimen with pembrolizumab and  cetuximab given the intolerance of cis-platinum  INTERVAL HISTORY Shannon Schroeder is a 65 y.o. female who has above history reviewed by me today presents for  follow up visit for management of head and neck cancer,  Patient tolerates pured food, takes nutrition supplements.  Her weight is stable. Continues to have right facial pain.  On gabapentin,  fentanyl patch 50 MCG/h every 72 hours, patient takes 4-5 Percocet per day  Was seen by Dr. Mickeal Skinner during the interval.  Currently she is on Trileptal 300 mg twice daily.  She reports pain is better controlled today.  Patient takes potassium 40 mEq twice daily    Review of Systems  Constitutional:  Positive for fatigue. Negative for appetite change, chills, fever and unexpected weight change.  HENT:   Negative for hearing loss, mouth sores and voice change.        Facial swelling, pain   Eyes:  Negative for eye problems.  Respiratory:  Negative for chest tightness, cough and shortness of breath.   Cardiovascular:  Negative for chest pain and palpitations.  Gastrointestinal:  Negative for abdominal distention, abdominal pain, blood in stool, constipation, nausea and vomiting.       Dysphagia  Endocrine: Negative for hot flashes.  Genitourinary:  Negative for difficulty urinating and frequency.   Musculoskeletal:  Negative for arthralgias.  Skin:  Negative for itching and rash.  Neurological:  Negative for extremity weakness and headaches.  Hematological:  Negative for adenopathy.  Psychiatric/Behavioral:  Negative for confusion.    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    Anxiety    Arthritis    Bipolar disorder (HCC)    COPD (chronic obstructive pulmonary disease) (HCC)    NO INHALERS   Depression    Dysrhythmia    H/O V TACH   Head and neck cancer ([place]) 07/29/2020   Headache    H/O MIGRAINES   Hypertension    Seizures ([place])    X1 AFTER FALL    SURGICAL HISTORY: Past Surgical History:  Procedure Laterality Date   ABDOMINAL HYSTERECTOMY     partial   BREAST SURGERY      biopsy   CARDIAC CATHETERIZATION     X 2   CARPAL TUNNEL RELEASE Right    CARPECTOMY HAND Right    CATARACT EXTRACTION W/PHACO Left 03/08/2018   Procedure: CATARACT EXTRACTION PHACO AND INTRAOCULAR LENS PLACEMENT ([place]);  Surgeon: Birder Robson, MD;  Location: [facility] ORS;  Service: Ophthalmology;  Laterality: Left;  [place] 00:55 AP% 13.5 CDE 7.52 Fluid pack lot # [id number]   CATARACT EXTRACTION W/PHACO Right 04/05/2018   Procedure: CATARACT EXTRACTION PHACO AND INTRAOCULAR LENS PLACEMENT (IOC);  Surgeon: Birder Robson, MD;  Location: [facility] ORS;  Service: Ophthalmology;  Laterality: Right;  [place] 00:36 AP% 15.9 CDE 5.72 Fluid pack lot # [id number]   CEREBRAL ANEURYSM REPAIR     CHOLECYSTECTOMY     COLONOSCOPY WITH PROPOFOL N/A 04/26/2019   Procedure: COLONOSCOPY WITH PROPOFOL;  Surgeon: Jonathon Bellows, MD;  Location: [facility] ENDOSCOPY;  Service: Gastroenterology;  Laterality: N/A;   PORTACATH PLACEMENT Left 08/05/2020   Procedure: INSERTION PORT-A-CATH;  Surgeon: Herbert Pun, MD;  Location: [facility] ORS;  Service: General;  Laterality: Left;   TUBAL LIGATION      SOCIAL HISTORY: Social History   Socioeconomic History   Marital status: Divorced    Spouse name: Not on file   Number  of children: Not on file   Years of education: 16   Highest education level: Bachelor's degree (e.g., BA, AB, BS)  Occupational History   Occupation: farmer  Tobacco Use   Smoking status: Every Day    Packs/day: 0.25    Years: 50.00    Pack years: 12.50    Types: Cigarettes   Smokeless tobacco: Never  Vaping Use   Vaping Use: Never used  Substance and Sexual Activity   Alcohol use: No   Drug use: No   Sexual activity: Not Currently  Other Topics Concern   Not on file  Social History Narrative   Pt [facility] at [facility].    Social Determinants of Health   Financial Resource Strain: Not on file  Food Insecurity: Not on file  Transportation Needs: Not on file  Physical Activity: Not on file   Stress: Not on file  Social Connections: Not on file  Intimate Partner Violence: Not on file    FAMILY HISTORY: Family History  Problem Relation Age of Onset   Hypertension Mother    Heart failure Mother    Hypertension Brother    Stroke Brother    Hypertension Son    Emphysema Maternal Aunt    Hypertension Paternal Aunt    Hypertension Paternal Uncle    Hypertension Maternal Grandmother    Hypertension Paternal Grandmother     ALLERGIES:  is allergic to naproxen, aspirin, belladonna alkaloids, fluoxetine, fluoxetine hcl, paroxetine hcl, phenobarbital, and prozac [fluoxetine hcl].  MEDICATIONS:  Current Outpatient Medications  Medication Sig Dispense Refill   albuterol (VENTOLIN HFA) 108 (90 Base) MCG/ACT inhaler Inhale 2 puffs into the lungs every 6 (six) hours as needed for wheezing or shortness of breath. 8 g 2   amiodarone (PACERONE) 200 MG tablet Take 1 tablet (200 mg total) by mouth daily. 90 tablet 1   apixaban (ELIQUIS) 5 MG TABS tablet Take 1 tablet (5 mg total) by mouth 2 (two) times daily. 180 tablet 3   chlorhexidine (PERIDEX) 0.12 % solution Use as directed 15 mLs in the mouth or throat 2 (two) times daily. 473 mL 6   docusate sodium (COLACE) 100 MG capsule Take 1 capsule (100 mg total) by mouth daily. 30 capsule 1   feeding supplement, ENSURE ENLIVE, (ENSURE ENLIVE) LIQD Take 237 mLs by mouth 3 (three) times daily between meals. 237 mL 12   fentaNYL (DURAGESIC) 75 MCG/HR Place 1 patch onto the skin every 3 (three) days. 10 patch 0   fluticasone-salmeterol (ADVAIR HFA) 45-21 MCG/ACT inhaler Inhale 2 puffs into the lungs 2 (two) times daily. 1 each 3   gabapentin (NEURONTIN) 300 MG capsule Take 2 capsules (600 mg total) by mouth 3 (three) times daily. 180 capsule 1   magnesium chloride (SLOW-MAG) 64 MG TBEC SR tablet Take 1 tablet (64 mg total) by mouth 2 (two) times daily. 60 tablet 1   Melatonin 10 MG TABS Take 10 mg by mouth at bedtime as needed (sleep).      ondansetron (ZOFRAN-ODT) 8 MG disintegrating tablet Take 1 tablet (8 mg total) by mouth every 8 (eight) hours as needed for nausea or vomiting. 90 tablet 0   Oxcarbazepine (TRILEPTAL) 300 MG tablet Take 1 tablet (300 mg total) by mouth 2 (two) times daily. 60 tablet 1   oxyCODONE-acetaminophen (PERCOCET) 10-325 MG tablet Take 1 tablet by mouth every 6 (six) hours as needed for pain. 120 tablet 0   polyethylene glycol powder (GLYCOLAX/MIRALAX) 17 GM/SCOOP powder Take 17 g  by mouth daily as needed for mild constipation or moderate constipation. 255 g 0   potassium chloride SA (KLOR-CON) 20 MEQ tablet Take 2 tablets (40 mEq total) by mouth daily. 60 tablet 1   furosemide (LASIX) 20 MG tablet Take 1 tablet (20 mg total) by mouth daily as needed (for lower extremity swelling). (Patient not taking: No sig reported) 90 tablet 1   mirtazapine (REMERON) 15 MG tablet Take 1 tablet (15 mg total) by mouth at bedtime. (Patient not taking: No sig reported) 30 tablet 0   Multiple Vitamins-Minerals (CENTRUM SILVER PO) Take 1 tablet by mouth daily. Gummie (Patient not taking: No sig reported)     No current facility-administered medications for this visit.   Facility-Administered Medications Ordered in Other Visits  Medication Dose Route Frequency Provider Last Rate Last Admin   0.9 % NaCl with KCl 20 mEq/ L  infusion   Intravenous Once Earlie Server, MD 999 mL/hr at 06/25/21 1237 New Bag at 06/25/21 1237   heparin lock flush 100 UNIT/ML injection            heparin lock flush 100 unit/mL  500 Units Intravenous Once Earlie Server, MD       heparin lock flush 100 unit/mL  500 Units Intravenous Once Earlie Server, MD       montelukast (SINGULAIR) tablet 10 mg  10 mg Oral Anthoney Harada, MD   10 mg at 06/25/21 0946   sodium chloride flush (NS) 0.9 % injection 10 mL  10 mL Intravenous PRN Earlie Server, MD   10 mL at 10/10/20 1040   sodium chloride flush (NS) 0.9 % injection 10 mL  10 mL Intravenous PRN Earlie Server, MD   10 mL at 11/13/20  0836   sodium chloride flush (NS) 0.9 % injection 10 mL  10 mL Intravenous PRN Earlie Server, MD   10 mL at 12/30/20 0838   sodium chloride flush (NS) 0.9 % injection 10 mL  10 mL Intravenous PRN Earlie Server, MD   10 mL at 01/14/21 0921   sodium chloride flush (NS) 0.9 % injection 10 mL  10 mL Intravenous PRN Earlie Server, MD   10 mL at 06/11/21 0815   sodium chloride flush (NS) 0.9 % injection 10 mL  10 mL Intravenous PRN Earlie Server, MD   10 mL at 06/25/21 [id number]     PHYSICAL EXAMINATION: ECOG PERFORMANCE STATUS: 1 - Symptomatic but completely ambulatory Vitals:   06/25/21 0837  BP: 109/64  Pulse: 79  Resp: 17  Temp: 98.2 F (36.8 C)  SpO2: 97%   Filed Weights   06/25/21 0837  Weight: 118 lb (53.5 kg)    Physical Exam Constitutional:      General: She is not in acute distress.    Comments: Thin built.  Patient sits in the wheelchair  HENT:     Head: Normocephalic and atraumatic.     Mouth/Throat:     Comments: Patient wears dentures She has right palate deficiency Right facial swelling and tenderness. Eyes:     General: No scleral icterus.    Comments: Right eyelid ptosis  Cardiovascular:     Rate and Rhythm: Normal rate and regular rhythm.     Heart sounds: Normal heart sounds.  Pulmonary:     Effort: Pulmonary effort is normal. No respiratory distress.     Breath sounds: Wheezing present.  Abdominal:     General: Bowel sounds are normal. There is no distension.     Palpations:  Abdomen is soft.  Musculoskeletal:        General: No deformity. Normal range of motion.     Cervical back: Normal range of motion and neck supple.  Skin:    General: Skin is warm and dry.     Findings: No erythema or rash.  Neurological:     Mental Status: She is alert and oriented to person, place, and time. Mental status is at baseline.     Cranial Nerves: No cranial nerve deficit.     Coordination: Coordination normal.  Psychiatric:        Mood and Affect: Mood normal.    LABORATORY DATA:  I  have reviewed the data as listed Lab Results  Component Value Date   WBC 7.9 06/25/2021   HGB 10.3 (L) 06/25/2021   HCT 31.5 (L) 06/25/2021   MCV 94.3 06/25/2021   PLT 454 (H) 06/25/2021   Recent Labs    07/10/20 1311 07/12/20 1204 08/08/20 0821 04/14/21 1317 04/23/21 0810 06/11/21 0808 06/18/21 0847 06/25/21 0822  NA 136 137   < > 129*   < > 130* 131* 129*  K 2.9* 4.0   < > 4.5   < > 2.7* 3.2* 3.1*  CL 97* 98   < > 92*   < > 93* 96* 92*  CO2 28 27   < > 29   < > '27 25 29  '$ GLUCOSE 100* 114*   < > 106*   < > 135* 106* 108*  BUN 13 8   < > 19   < > 8 6* 12  CREATININE 0.43* 0.42*   < > 0.46   < > 0.42* 0.44 0.39*  CALCIUM 8.9 9.0   < > 9.0   < > 8.5* 8.7* 8.4*  GFRNONAA >60 >60   < > >60   < > >60 >60 >60  GFRAA >60 >60  --   --   --   --   --   --   PROT 7.0  --    < > 7.1   < > 7.2 7.1 7.1  ALBUMIN 3.7  --    < > 3.1*   < > 3.1* 3.1* 3.2*  AST 20  --    < > 15   < > '20 16 16  '$ ALT 16  --    < > 10   < > '10 11 11  '$ ALKPHOS 106  --    < > 115   < > 119 109 107  BILITOT 0.5  --    < > <0.1*   < > 0.4 0.2* 0.3  BILIDIR  --   --   --  <0.1  --   --   --   --   IBILI  --   --   --  NOT CALCULATED  --   --   --   --    < > = values in this interval not displayed.    Iron/TIBC/Ferritin/ %Sat    Component Value Date/Time   IRON 46 09/09/2020 0938   IRON 37 05/03/2018 1826   TIBC 210 (L) 09/09/2020 0938   FERRITIN 136 09/09/2020 0938   FERRITIN 52 05/03/2018 1826   IRONPCTSAT 22 09/09/2020 0938      RADIOGRAPHIC STUDIES: I have personally reviewed the radiological images as listed and agreed with the findings in the report. No results found.     ASSESSMENT & PLAN:  1. 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[place])  2. Trigeminal neuropathy   3. Hypokalemia   4. Hypomagnesemia   5. Neoplasm related pain   6. Moderate protein-calorie malnutrition ([place])   7. Encounter for antineoplastic immunotherapy   8. Head and neck cancer (HCC)    #maxillary sinus squamous cell carcinoma, s/p  concurrent chemotherapy and radiation-recurrence-Unresectable  Labs reviewed and discussed with patient To proceed with cycle 4-day 1 cetuximab, Keytruda. MRI brain for assessment of treatment response.  #Neoplasm related pain patient continues to have severe right facial neoplasm related pain.I will obtain MRI brain with and without contrast next 1 to 2 weeks for evaluation of disease status.  Increase fentanyl patch to 75 MCG/h every 72 hours, continue Percocet 10/325 every 4-6 hours as needed.  Refills were sent. Continue gabapentin 600 mg 3 times daily.  Continue Trileptal 300 mg twice daily recommended by Dr. Mickeal Skinner. Patient has an upcoming appointment with Dr. Baruch Gouty.   #Malnutrition, dysphagia She appears having better oral intake and has gained weight.  We will hold off tube placement at this point.  Continue nutrition supplementation.  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 Refilled Peridex solution for her.  Recommend swish and spit twice daily  #Hyponatremia, either due to SIADH secondary to malignancy versus due to decreased oral intake. Na level is stable 129 Liberate dietary salt intake.  Proceed with 1 L of IV fluid plus IV potassium chloride 20 mEq x 1 today.  #Hypokalemia, K is 3.1 continue potassium chloride 11mq BID.   # Hypomagnesia, magnesium 1.8.  Continue Slow-Mag 1 tablet daily.    #Atrial fibrillation on amiodarone and metoprolol.Eliquis  All questions were answered. The patient knows to call the clinic with any problems questions or concerns.   Return of visit:   1 week for lab md cycle 4 Keytruda cetuximab  ZEarlie Server MD, PhD Hematology Oncology CB[facility] Hospitalat ARocky Mountain Eye Surgery Center IncPager- 3IE:[phone]/24/2022

## 2021-06-25 NOTE — Progress Notes (Signed)
Radiation Oncology Follow up Note  Name: Shannon Schroeder   Date:   06/25/2021 MRN:  [id number] DOB: 02/18/1956    This 65 y.o. female presents to the clinic today for persistent head and neck cancer and patient previously treated.  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 provider found  HPI: Patient is a.  65 year old female originally presented 1 year prior with swelling in the right side of her mouth and face.  She had bulky squamous cell carcinoma destroying the right maxillary sinus infiltrating along the right buccal space floor of the right nasal cavity.  Right orbital apex was also involved and there was intracranial extension along the right V2 of the foramen rotundum.  She has been treated with multiple chemotherapy regimens as well as immunotherapy including cetuximab Taxol and carbo in the interim.  She continues to have facial pain and some dysphagia and is currently on cetuximab and Keytruda.  She has severe right facial neoplastic pain and is currently narcotic analgesics for that.  Most recent MRI scan showedGrossly stable abnormal enhancement involving the right cavernous sinus skull base and muscles mastication.   Perineural tumor spread V1, V2, V3 with significant involvement of right cavernous sinus again noted.   Prior right maxillary sinus surgery with right sinus floor and anterior wall absent. Significant reduction in soft tissue swelling and enhancement anterior to the right maxillary sinus and in the right nasolacrimal fold..  She is now referred to [facility] for consideration of salvage therapy.  She is wheelchair-bound frail-appearing complaining of dysphagia as well as right facial pain and swelling.  COMPLICATIONS OF TREATMENT: none  FOLLOW UP COMPLIANCE: keeps appointments   PHYSICAL EXAM:  LMP 11/19/1988 (Exact Date)  No evidence of cervical adenopathy is  identified she does have obvious right facial swelling.  Well-developed well-nourished patient in NAD. HEENT reveals PERLA, EOMI, discs not visualized.  Oral cavity is clear. No oral mucosal lesions are identified. Neck is clear without evidence of cervical or supraclavicular adenopathy. Lungs are clear to A&P. Cardiac examination is essentially unremarkable with regular rate and rhythm without murmur rub or thrill. Abdomen is benign with no organomegaly or masses noted. Motor sensory and DTR levels are equal and symmetric in the upper and lower extremities. Cranial nerves II through XII are grossly intact. Proprioception is intact. No peripheral adenopathy or edema is identified. No motor or sensory levels are noted. Crude visual fields are within normal range.  RADIOLOGY RESULTS: MRI scans reviewed PET CT scan ordered  PLAN: Present atelectasis run a PET/CT on her to quantitate the amount of viable tumor present for targeting.  I believe I can do salvage radiation therapy to 60 [address] over 6 weeks to this area may do this with concurrent chemotherapy.  After PET scan will make final determination of exact fields and treatment technique.  I would favor IMRT based on previous radiation to this area.  Risks and benefits of treatment will be similar to prior treatments except the fields were much more limited and more tolerable with less dysphagia skin reaction noted.  I would like to take this opportunity to thank you for allowing me to participate in the care of your patient.Noreene Filbert, MD

## 2021-06-25 NOTE — Progress Notes (Signed)
No new concerns today 

## 2021-06-25 NOTE — Patient Instructions (Signed)
[facility]COLOG[facility]  Discharge Instructions: Thank you for choosing [facility] to provide your oncology and hematology care.  If you have a lab appointment with the [facility], please go directly to the [facility] and check in at the registration area.  Wear comfortable clothing and clothing appropriate for easy access to any Portacath or PICC line.   We strive to give you quality time with your provider. You may need to reschedule your appointment if you arrive late (15 or more minutes).  Arriving late affects you and other patients whose appointments are after yours.  Also, if you miss three or more appointments without notifying the office, you may be dismissed from the clinic at the provider's discretion.      For prescription refill requests, have your pharmacy contact our office and allow 72 hours for refills to be completed.    Today you received the following chemotherapy and/or immunotherapy agents : Keytruda / Erbitux / Potassium    To help prevent nausea and vomiting after your treatment, we encourage you to take your nausea medication as directed.  BELOW ARE SYMPTOMS THAT SHOULD BE REPORTED IMMEDIATELY: *FEVER GREATER THAN 100.4 F (38 C) OR HIGHER *CHILLS OR SWEATING *NAUSEA AND VOMITING THAT IS NOT CONTROLLED WITH YOUR NAUSEA MEDICATION *UNUSUAL SHORTNESS OF BREATH *UNUSUAL BRUISING OR BLEEDING *URINARY PROBLEMS (pain or burning when urinating, or frequent urination) *BOWEL PROBLEMS (unusual diarrhea, constipation, pain near the anus) TENDERNESS IN MOUTH AND THROAT WITH OR WITHOUT PRESENCE OF ULCERS (sore throat, sores in mouth, or a toothache) UNUSUAL RASH, SWELLING OR PAIN  UNUSUAL VAGINAL DISCHARGE OR ITCHING   Items with * indicate a potential emergency and should be followed up as soon as possible or go to the Emergency Department if any problems should occur.  Please show the CHEMOTHERAPY ALERT CARD or IMMUNOTHERAPY  ALERT CARD at check-in to the Emergency Department and triage nurse.  Should you have questions after your visit or need to cancel or reschedule your appointment, please contact [facility]  [phone] and follow the prompts.  Office hours are 8:00 a.m. to 4:30 p.m. Monday - Friday. Please note that voicemails left after 4:00 p.m. may not be returned until the following business day.  We are closed weekends and major holidays. You have access to a nurse at all times for urgent questions. Please call the main number to the clinic [phone] and follow the prompts.  For any non-urgent questions, you may also contact your provider using MyChart. We now offer e-Visits for anyone 6 and older to request care online for non-urgent symptoms. For details visit [email or web address].   Also download the MyChart app! Go to the app store, search "MyChart", open the app, select [facility], and log in with your MyChart username and password.  Due to Covid, a mask is required upon entering the hospital/clinic. If you do not have a mask, one will be given to you upon arrival. For doctor visits, patients may have 1 support person aged 17 or older with them. For treatment visits, patients cannot have anyone with them due to current Covid guidelines and our immunocompromised population.

## 2021-06-26 ENCOUNTER — Other Ambulatory Visit: Payer: Self-pay | Admitting: Oncology

## 2021-06-27 ENCOUNTER — Ambulatory Visit
Admission: RE | Admit: 2021-06-27 | Discharge: 2021-06-27 | Disposition: A | Discharge status: Home / Self Care | Payer: Medicare Other | Source: Ambulatory Visit | Attending: Oncology | Admitting: Oncology

## 2021-06-27 ENCOUNTER — Other Ambulatory Visit: Payer: Self-pay

## 2021-06-27 ENCOUNTER — Encounter: Payer: Self-pay | Admitting: Oncology

## 2021-06-27 DIAGNOSIS — C760 Malignant neoplasm of head, face and neck: Secondary | ICD-10-CM | POA: Diagnosis present

## 2021-06-27 DIAGNOSIS — C310 Malignant neoplasm of maxillary sinus: Secondary | ICD-10-CM | POA: Diagnosis not present

## 2021-06-27 IMAGING — MR MR HEAD WO/W CM
14 series · 48 of 48 positions shown · IV contrast (gadavist)
Comparison: [DATE] and earlier

CLINICAL DATA: Maxillary sinus cancer.

EXAM:
MRI HEAD WITHOUT AND WITH CONTRAST
TECHNIQUE: Multiplanar, multiecho pulse sequences of the brain and surrounding
structures were obtained without and with intravenous contrast.
CONTRAST:  5mL GADAVIST GADOBUTROL 1 MMOL/ML IV SOLN

[Series 5: ax dwi_tracew · axial · 3.0mm · 0.71mm/px · z∈[-75,+85]mm · 3 of 56 slices shown]
[im 1/56]
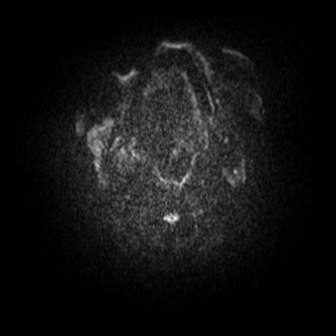
[im 28/56]
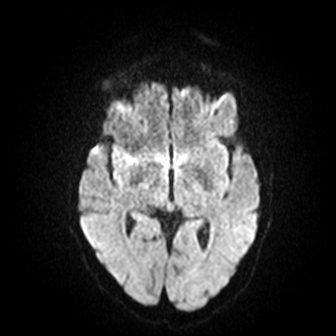
[im 56/56]
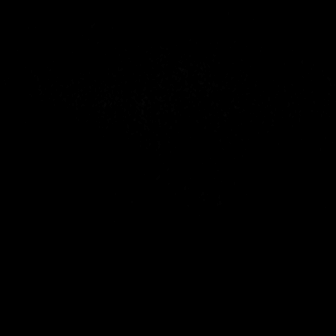

[Series 6: ax dwi_adc · axial · 3.0mm · 0.71mm/px · z∈[-75,+82]mm · 3 of 55 slices shown]
[im 1/55]
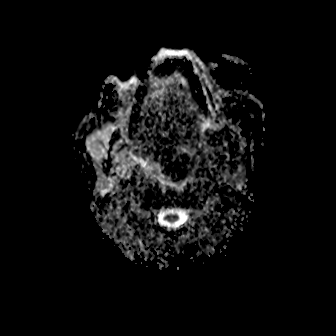
[im 28/55]
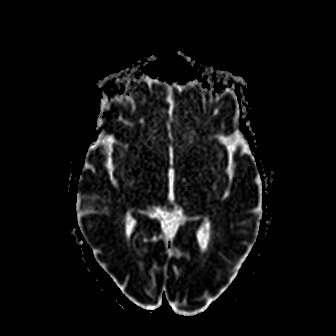
[im 55/55]
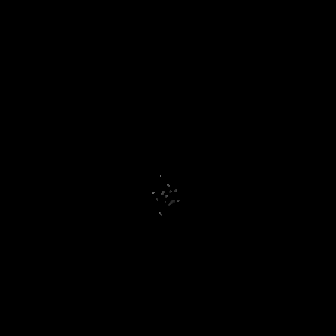

[Series 7: cor dwi_tracew · coronal · 5.0mm · 0.68mm/px · 2 of 40 slices shown]
[im 1/40]
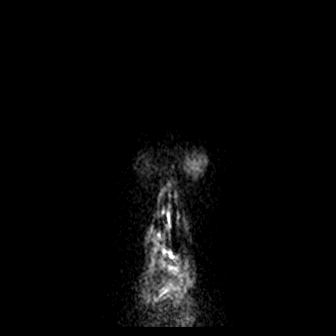
[im 40/40]
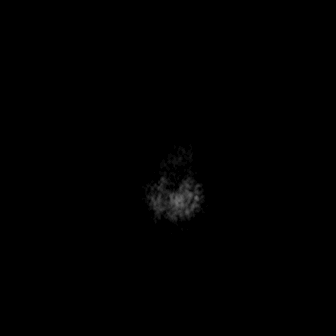

[Series 8: cor dwi_adc · coronal · 5.0mm · 0.68mm/px · 2 of 40 slices shown]
[im 1/40]
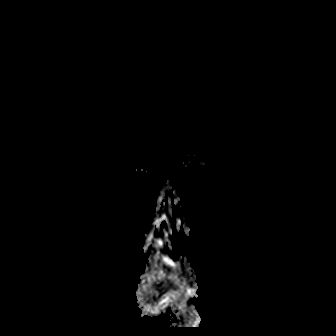
[im 40/40]
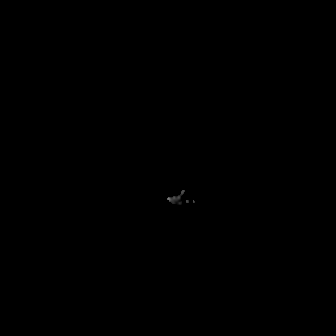

[Series 9: T1 · sagittal · 5.0mm · 0.62mm/px · 1 of 24 slices shown (1 of 2)]
[im 1/24]
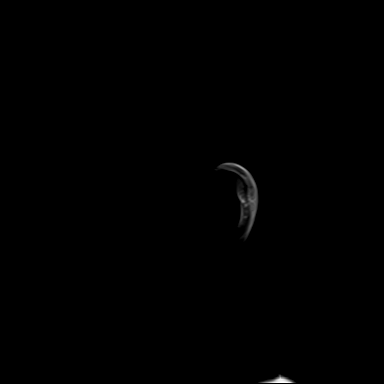

[Series 10: T2 · axial · 5.0mm · 0.55mm/px · 1 of 27 slices shown]
[im 1/27]
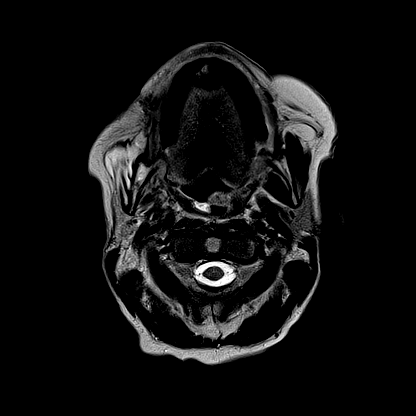

[Series 11: FLAIR · axial · 3.0mm · 0.72mm/px · z∈[-73,+84]mm · 3 of 55 slices shown]
[im 1/55]
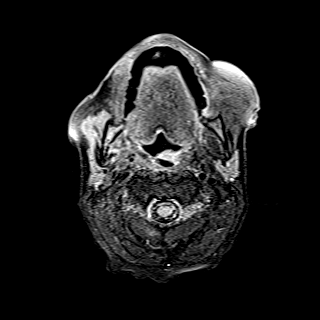
[im 28/55]
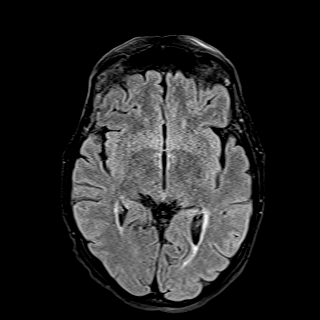
[im 55/55]
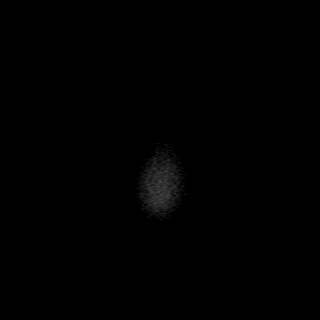

[Series 13: pha_images · axial · 3.0mm · 0.90mm/px · z∈[-69,+80]mm · 3 of 52 slices shown]
[im 1/52]
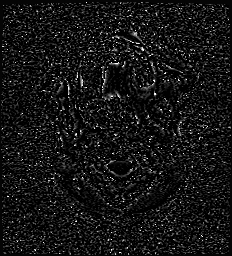
[im 26/52]
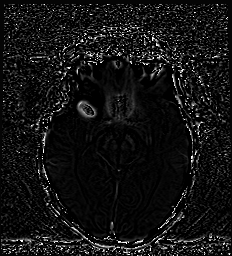
[im 52/52]
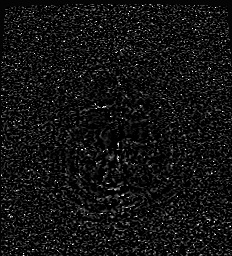

[Series 14: swi_images · axial · 3.0mm · 0.90mm/px · z∈[-69,+80]mm · 3 of 52 slices shown]
[im 1/52]
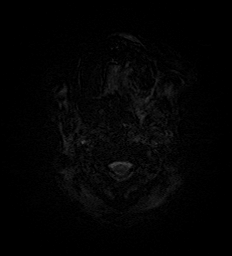
[im 26/52]
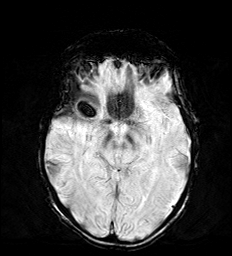
[im 52/52]
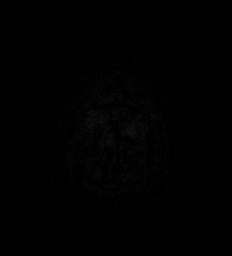

[Series 16: T1 · axial · 1.0mm · 0.98mm/px · z∈[-110,+91]mm · 11 of 206 slices shown (2 of 2)]
[im 1/206]
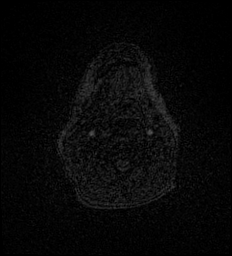
[im 21/206]
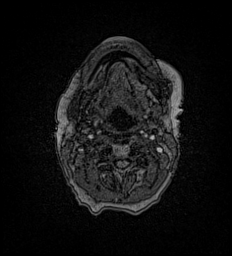
[im 42/206]
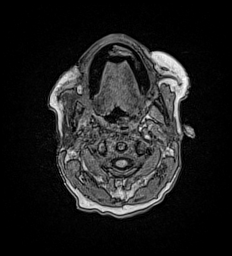
[im 62/206]
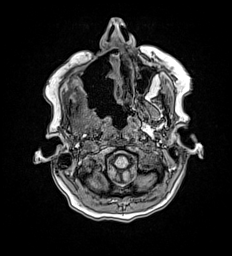
[im 83/206]
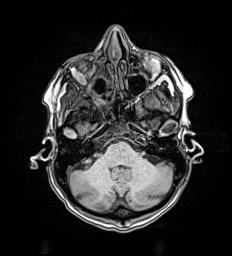
[im 103/206]
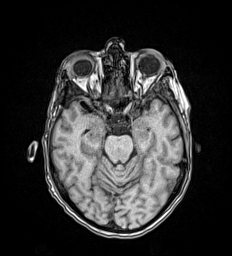
[im 124/206]
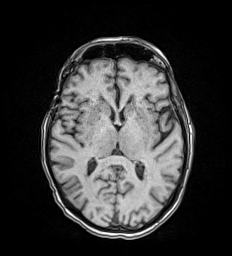
[im 144/206]
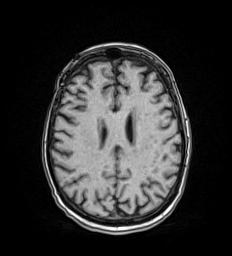
[im 165/206]
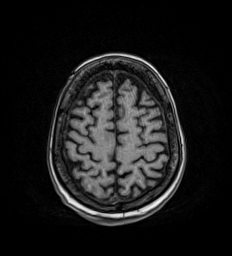
[im 185/206]
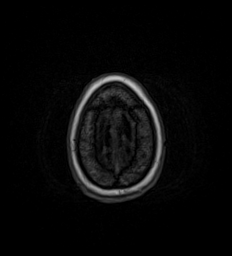
[im 206/206]
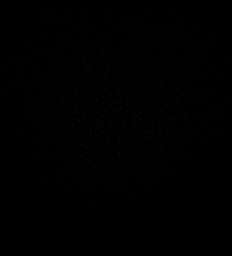

[Series 17: T2 post-contrast · coronal · 5.0mm · 0.72mm/px · 2 of 33 slices shown]
[im 1/33]
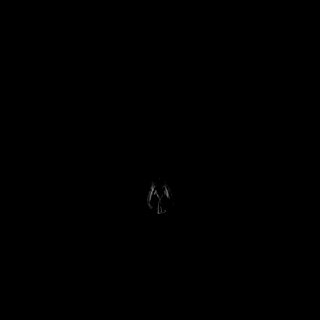
[im 33/33]
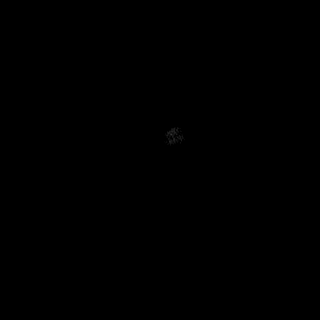

[Series 18: T1 post-contrast · axial · 1.0mm · 0.98mm/px · z∈[-110,+90]mm · 11 of 204 slices shown (1 of 3)]
[im 1/204]
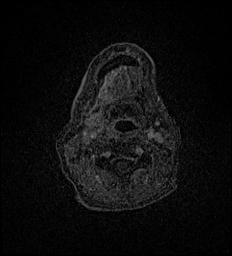
[im 21/204]
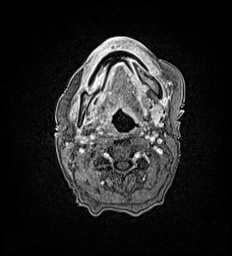
[im 41/204]
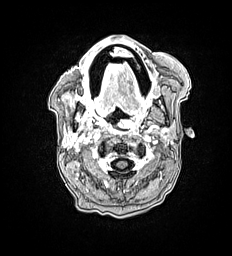
[im 61/204]
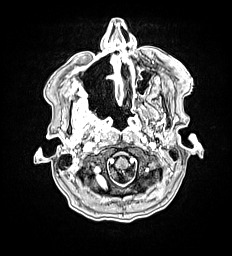
[im 82/204]
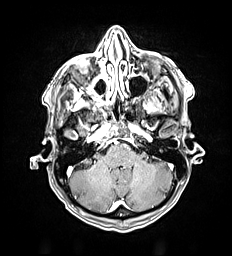
[im 102/204]
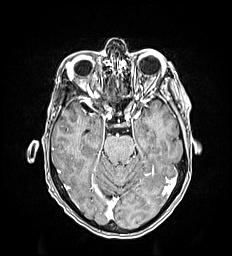
[im 122/204]
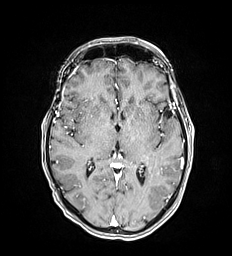
[im 143/204]
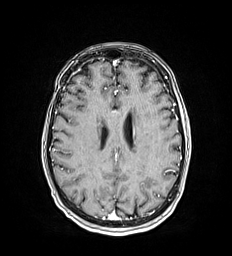
[im 163/204]
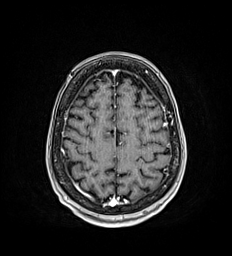
[im 183/204]
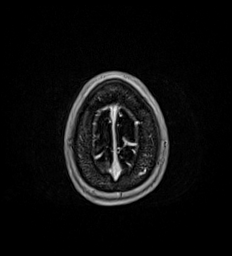
[im 204/204]
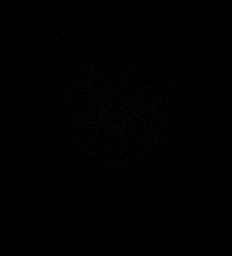

[Series 19: T1 post-contrast · coronal · 5.0mm · 0.60mm/px · 2 of 33 slices shown (2 of 3)]
[im 1/33]
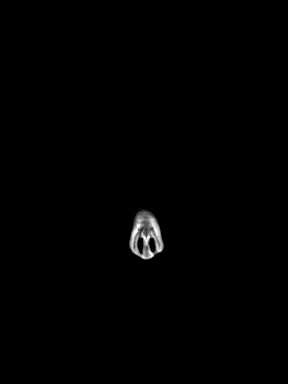
[im 33/33]
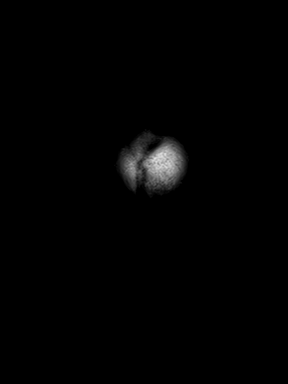

[Series 20: T1 post-contrast · sagittal · 5.0mm · 0.62mm/px · 1 of 25 slices shown (3 of 3)]
[im 1/25]
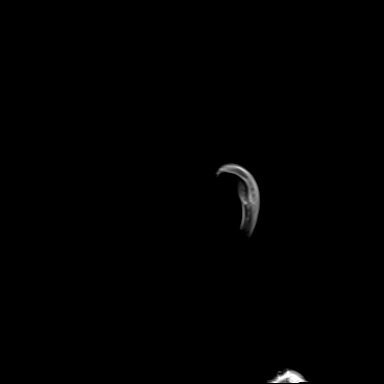

[48 of 48 positions shown; findings below may reference images not displayed]

FINDINGS: Brain: No evidence of parenchymal or cisternal involvement. No
infarct, hemorrhage, hydrocephalus, or collection.

Vascular: Right MCA aneurysm clipping via pterygoid craniotomy.

Skull and upper cervical spine: Unremarkable prior craniotomy

Sinuses/Orbits: Right maxillary sinus cancer with extensive extra
spinous spread and surgical treatment. Although a different protocol
was used on prior, there is decreased bulk of dominant residual
tumor in the right masticator space which defies reproducible
measurement given the irregular shape and infiltrative appearance.
Fairly thin rind of enhancing tissue around the remaining right
maxillary sinus is non progressed. Perineural tumor with dural
infiltration/thickening overlying the right Meckel's cave and medial
middle cranial fossa is unchanged. Tumor over rides the right
mandibular foramen without visible tumor infiltration at the level
of the edentulous mandible. The orbits appear clear.
IMPRESSION: 1. Positive treatment response with decreasing tumor bulk in the
right masticator space. Known perineural tumor spread with dural
thickening at the right middle cranial fossa that is nonprogressive.
2. No evidence of cisternal or parenchymal brain involvement.
3. At future follow-up imaging, recommend trigeminal nerve protocol
or facial protocol rather than standard brain MRI.

## 2021-06-27 MED ORDER — GADOBUTROL 1 MMOL/ML IV SOLN
5.0000 mL | Freq: Once | INTRAVENOUS | Status: AC | PRN
  Administered 2021-06-27: 5 mL via INTRAVENOUS

## 2021-06-30 ENCOUNTER — Inpatient Hospital Stay: Payer: Medicare Other | Attending: Internal Medicine

## 2021-07-02 ENCOUNTER — Inpatient Hospital Stay: Payer: Medicare Other

## 2021-07-02 ENCOUNTER — Other Ambulatory Visit: Payer: Self-pay

## 2021-07-02 ENCOUNTER — Encounter: Payer: Self-pay | Admitting: Oncology

## 2021-07-02 ENCOUNTER — Inpatient Hospital Stay (HOSPITAL_BASED_OUTPATIENT_CLINIC_OR_DEPARTMENT_OTHER): Payer: Medicare Other | Admitting: Oncology

## 2021-07-02 VITALS — BP 113/64 | HR 69 | Resp 16

## 2021-07-02 VITALS — BP 125/76 | HR 96 | Temp 96.9°F | Resp 16 | Wt 114.0 lb

## 2021-07-02 DIAGNOSIS — G893 Neoplasm related pain (acute) (chronic): Secondary | ICD-10-CM

## 2021-07-02 DIAGNOSIS — E440 Moderate protein-calorie malnutrition: Secondary | ICD-10-CM | POA: Diagnosis not present

## 2021-07-02 DIAGNOSIS — G509 Disorder of trigeminal nerve, unspecified: Secondary | ICD-10-CM

## 2021-07-02 DIAGNOSIS — Z5111 Encounter for antineoplastic chemotherapy: Secondary | ICD-10-CM | POA: Diagnosis not present

## 2021-07-02 DIAGNOSIS — Z8679 Personal history of other diseases of the circulatory system: Secondary | ICD-10-CM

## 2021-07-02 DIAGNOSIS — Z95828 Presence of other vascular implants and grafts: Secondary | ICD-10-CM

## 2021-07-02 DIAGNOSIS — C310 Malignant neoplasm of maxillary sinus: Secondary | ICD-10-CM | POA: Diagnosis not present

## 2021-07-02 DIAGNOSIS — Z5112 Encounter for antineoplastic immunotherapy: Secondary | ICD-10-CM

## 2021-07-02 DIAGNOSIS — E876 Hypokalemia: Secondary | ICD-10-CM

## 2021-07-02 DIAGNOSIS — C760 Malignant neoplasm of head, face and neck: Secondary | ICD-10-CM

## 2021-07-02 LAB — COMPREHENSIVE METABOLIC PANEL
ALT: 10 U/L (ref 0–44)
AST: 17 U/L (ref 15–41)
Albumin: 3.3 g/dL — ABNORMAL LOW (ref 3.5–5.0)
Alkaline Phosphatase: 107 U/L (ref 38–126)
Anion gap: 11 (ref 5–15)
BUN: 7 mg/dL — ABNORMAL LOW (ref 8–23)
CO2: 26 mmol/L (ref 22–32)
Calcium: 8.7 mg/dL — ABNORMAL LOW (ref 8.9–10.3)
Chloride: 95 mmol/L — ABNORMAL LOW (ref 98–111)
Creatinine, Ser: 0.48 mg/dL (ref 0.44–1.00)
GFR, Estimated: >60 mL/min (ref >60)
Glucose, Bld: 124 mg/dL — ABNORMAL HIGH (ref 70–99)
Potassium: 3 mmol/L — ABNORMAL LOW (ref 3.5–5.1)
Sodium: 132 mmol/L — ABNORMAL LOW (ref 135–145)
Total Bilirubin: 0.4 mg/dL (ref 0.3–1.2)
Total Protein: 7.5 g/dL (ref 6.5–8.1)

## 2021-07-02 LAB — CBC WITH DIFFERENTIAL/PLATELET
Abs Immature Granulocytes: 0.04 10*3/uL (ref 0.00–0.07)
Basophils Absolute: 0.1 10*3/uL (ref 0.0–0.1)
Basophils Relative: 1 %
Eosinophils Absolute: 0.5 10*3/uL (ref 0.0–0.5)
Eosinophils Relative: 5 %
HCT: 35.3 % — ABNORMAL LOW (ref 36.0–46.0)
Hemoglobin: 11.5 g/dL — ABNORMAL LOW (ref 12.0–15.0)
Immature Granulocytes: 0 %
Lymphocytes Relative: 6 %
Lymphs Abs: 0.6 10*3/uL — ABNORMAL LOW (ref 0.7–4.0)
MCH: 30.2 pg (ref 26.0–34.0)
MCHC: 32.6 g/dL (ref 30.0–36.0)
MCV: 92.7 fL (ref 80.0–100.0)
Monocytes Absolute: 1.1 10*3/uL — ABNORMAL HIGH (ref 0.1–1.0)
Monocytes Relative: 11 %
Neutro Abs: 7.9 10*3/uL — ABNORMAL HIGH (ref 1.7–7.7)
Neutrophils Relative %: 77 %
Platelets: 456 10*3/uL — ABNORMAL HIGH (ref 150–400)
RBC: 3.81 MIL/uL — ABNORMAL LOW (ref 3.87–5.11)
RDW: 14.9 % (ref 11.5–15.5)
WBC: 10.2 10*3/uL (ref 4.0–10.5)
nRBC: 0 % (ref 0.0–0.2)

## 2021-07-02 LAB — MAGNESIUM: Magnesium: 1.8 mg/dL (ref 1.7–2.4)

## 2021-07-02 MED ORDER — HEPARIN SOD (PORK) LOCK FLUSH 100 UNIT/ML IV SOLN
500.0000 [IU] | Freq: Once | INTRAVENOUS | Status: AC
  Filled 2021-07-02: qty 5

## 2021-07-02 MED ORDER — FAMOTIDINE 20 MG IN NS 100 ML IVPB
20.0000 mg | Freq: Once | INTRAVENOUS | Status: AC
  Administered 2021-07-02: 20 mg via INTRAVENOUS
  Filled 2021-07-02: qty 20

## 2021-07-02 MED ORDER — CETUXIMAB CHEMO IV INJECTION 200 MG/100ML
250.0000 mg/m2 | Freq: Once | INTRAVENOUS | Status: AC
  Administered 2021-07-02: 400 mg via INTRAVENOUS
  Filled 2021-07-02: qty 200

## 2021-07-02 MED ORDER — SODIUM CHLORIDE 0.9 % IV SOLN
Freq: Once | INTRAVENOUS | Status: AC
  Filled 2021-07-02: qty 250

## 2021-07-02 MED ORDER — SODIUM CHLORIDE 0.9% FLUSH
10.0000 mL | Freq: Once | INTRAVENOUS | Status: AC
  Administered 2021-07-02: 10 mL via INTRAVENOUS
  Filled 2021-07-02: qty 10

## 2021-07-02 MED ORDER — DIPHENHYDRAMINE HCL 50 MG/ML IJ SOLN
50.0000 mg | Freq: Once | INTRAMUSCULAR | Status: AC
  Administered 2021-07-02: 50 mg via INTRAVENOUS
  Filled 2021-07-02: qty 1

## 2021-07-02 MED ORDER — MONTELUKAST SODIUM 10 MG PO TABS
10.0000 mg | ORAL_TABLET | Freq: Once | ORAL | Status: AC
  Administered 2021-07-02: 10 mg via ORAL
  Filled 2021-07-02: qty 1

## 2021-07-02 MED ORDER — SODIUM CHLORIDE 0.9 % IV SOLN
10.0000 mg | Freq: Once | INTRAVENOUS | Status: AC
  Administered 2021-07-02: 10 mg via INTRAVENOUS
  Filled 2021-07-02: qty 10

## 2021-07-02 MED ORDER — HEPARIN SOD (PORK) LOCK FLUSH 100 UNIT/ML IV SOLN
INTRAVENOUS | Status: AC
  Administered 2021-07-02: 500 [IU] via INTRAVENOUS
  Filled 2021-07-02: qty 5

## 2021-07-02 MED ORDER — POTASSIUM CHLORIDE 20 MEQ/100ML IV SOLN
20.0000 meq | Freq: Once | INTRAVENOUS | Status: AC
  Administered 2021-07-02: 20 meq via INTRAVENOUS

## 2021-07-02 NOTE — Patient Instructions (Signed)
[facility]COL[facility]  Discharge Instructions: Thank you for choosing [facility] to provide your oncology and hematology care.  If you have a lab appointment with the [facility], please go directly to the [facility] and check in at the registration area.  Wear comfortable clothing and clothing appropriate for easy access to any Portacath or PICC line.   We strive to give you quality time with your provider. You may need to reschedule your appointment if you arrive late (15 or more minutes).  Arriving late affects you and other patients whose appointments are after yours.  Also, if you miss three or more appointments without notifying the office, you may be dismissed from the clinic at the provider's discretion.      For prescription refill requests, have your pharmacy contact our office and allow 72 hours for refills to be completed.    Today you received the following chemotherapy and/or immunotherapy agents Cetuximab      To help prevent nausea and vomiting after your treatment, we encourage you to take your nausea medication as directed.  BELOW ARE SYMPTOMS THAT SHOULD BE REPORTED IMMEDIATELY: *FEVER GREATER THAN 100.4 F (38 C) OR HIGHER *CHILLS OR SWEATING *NAUSEA AND VOMITING THAT IS NOT CONTROLLED WITH YOUR NAUSEA MEDICATION *UNUSUAL SHORTNESS OF BREATH *UNUSUAL BRUISING OR BLEEDING *URINARY PROBLEMS (pain or burning when urinating, or frequent urination) *BOWEL PROBLEMS (unusual diarrhea, constipation, pain near the anus) TENDERNESS IN MOUTH AND THROAT WITH OR WITHOUT PRESENCE OF ULCERS (sore throat, sores in mouth, or a toothache) UNUSUAL RASH, SWELLING OR PAIN  UNUSUAL VAGINAL DISCHARGE OR ITCHING   Items with * indicate a potential emergency and should be followed up as soon as possible or go to the Emergency Department if any problems should occur.  Please show the CHEMOTHERAPY ALERT CARD or IMMUNOTHERAPY ALERT CARD at check-in  to the Emergency Department and triage nurse.  Should you have questions after your visit or need to cancel or reschedule your appointment, please contact [facility]  [phone] and follow the prompts.  Office hours are 8:00 a.m. to 4:30 p.m. Monday - Friday. Please note that voicemails left after 4:00 p.m. may not be returned until the following business day.  We are closed weekends and major holidays. You have access to a nurse at all times for urgent questions. Please call the main number to the clinic [phone] and follow the prompts.  For any non-urgent questions, you may also contact your provider using MyChart. We now offer e-Visits for anyone 70 and older to request care online for non-urgent symptoms. For details visit [email or web address].   Also download the MyChart app! Go to the app store, search "MyChart", open the app, select [facility], and log in with your MyChart username and password.  Due to Covid, a mask is required upon entering the hospital/clinic. If you do not have a mask, one will be given to you upon arrival. For doctor visits, patients may have 1 support person aged 47 or older with them. For treatment visits, patients cannot have anyone with them due to current Covid guidelines and our immunocompromised population.

## 2021-07-02 NOTE — Progress Notes (Signed)
Hematology/Oncology follow up note [name] [phone] [phone]   Patient Care Team: Pcp, No as PCP - General Rockey Situ, Kathlene November, MD as PCP - Cardiology (Cardiology) Earlie Server, MD as Consulting Physician (Oncology)  CHIEF COMPLAINTS/REASON FOR VISIT:  Follow-up for head and neck cancer  HISTORY OF PRESENTING ILLNESS:   Shannon Schroeder is a  65 y.o.  female with PMH listed below was seen in consultation at the request of ER Dr. [name] for evaluation of abnormal CT scan.  07/10/2020 she presented emergency room for evaluation of right-sided facial swelling and pain in the right side of her mouth for the past 6 weeks.  It started in her mouth with a small ulcer which progressively got worse.  She wears denture.  Not able to chew well.  She eats soft food. She was found to have right upper gum also with yellowish discharge noted. 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  Avoid piloting.,  Right orbital apex, also the right soft palate and palate and tonsil.  Furthermore there is evidence of intracranial extension along the right V2 at the foramen rotundum Small suspicious right level 1A lymph node measuring 7 mm.  Separate small indeterminate but suspicious enhancing soft tissue nodule of the left sublingual space.  Patient was referred to oncology for further evaluation management. Patient denies fever, chills, nausea vomiting diarrhea shortness of breath or cough.  Reports 10 out of 10 severe pain of right face and numbness.  She also has headache. Reports unintentional weight loss  She reports no contactable family members.  She is in the process of pointing her roommate [name] to be her power of attorney.  She also wants to record today's conversation for her roommate  [name]. Patient has 50-pack-year smoking history.  # seen by ENT Dr.Juengel and had a biopsy. Biopsy was sent to [name] and the final pathology was positive for invasive squamous cell carcinoma, well-differentiated, keratinizing. 07/17/2020, brain MRI showed large right facial mass with right maxillary erosion involving the maxillary sinus, hard palate and pterygoid plates and muscles.  Perineural spreading along the multiple branches of the maxillary division of the right trigeminal nerve at the infraorbital canal, pterygopalatine fossa and foramen rotundum. 07/17/2020, PET scan showed Intense FDG uptake is associated with the large enhancing tumor which involves the right maxilla, right nasal cavity, right pterygoid palatine fossa, right orbital apex and right soft tissue palate and palatine tonsil. 2. Mild nonspecific FDG uptake is associated with the recently characterized suspicious right level 1A lymph node which measures 7 mm and has an SUV max of 2.12.  No signs of distant metastasis.  Aortic atherosclerosis.  Patient also reports a history of hypertension, dysrhythmia, history of V. tach.  Patient previously was on lisinopril, atenolol, amlodipine for which she is no longer taking at this point due to lack of medication coverage.  She also does not have any primary care provider. 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  On average she says she is takes 2 to 3 pills/day.  She also takes gabapentin 300 mg 3 times daily  # new onset of atrial fibrillation with rapid ventricular  response.  Patient was started on metoprolol.  Eliquis for anticoagulation.  # 09/06/2020- 09/11/2020 admitted due to A. fib, hypotension, multifocal pneumonia.  Patient was treated with IV antibiotics.  Cardiology saw the patient during admission and  added amiodarone.  Patient was discharged on 5 days course of oral antibiotics, continued on metoprolol and amiodarone for atrial fibrillation.  # Dysphagia, she declined PET tube placment.  #08/08/2020-10/11/2020 concurrent cisplatin and radiation. # 01/09/2021, PET scan showed improvement but persistent hypermetabolic activity associated with the right frontal sinus.Marked improvement in the posterior right nasopharynx with decrease in metabolic activity and tissue thickening.  Persistent but improved hypermetabolic thickened tissue in the anterior and medial right maxillary sinus.  Persistent hypermetabolic activity posterior at this level of pterygoid plate on the RIGHT. Potential new activity in the RIGHT skull base inferior medial along the sphenoid bone  # 01/14/2021, She had recurrent maxillary sinus squamous cell carcinoma and was referred to [name] for discussion of feasibility of residual disease resection.  Patient stated at [name] for her oncology care after that. Extensive outside medical records review was performed by me via Care Everywhere.   01/27/21 MRI brain with and without contrast were notable for recurrent disease in the right pterygopalatine fossa, along V2/V3 extending into the cavernous sinus and orbital fissure, as possibleperineural spread.  01/29/2021 patient establish care with [name]  Due to the extent of disease, patient was not considered as a surgical candidate.  Dr.Blumberg sent patient to establish care with [name] for further discussion. Patient was recommended to proceed with neoadjuvant chemotherapy  Keis regimen followed by reevaluation of surgical resection.  Patient understands that treatment goal include symptom control and increase chance of disease control and her surgical outcome.  Patient understands that if she does not respond, same treatment will be continued but with palliative intent.   Keis regimen : Cetuximab 400 mg/m2 load then 250  mg/m2 weekly, PACLItaxel 135 mg/m2 weekly,CARBOplatin AUC 2 weekly for 6 weeks Alpha gal was negative   02/06/2021  C1D1  Cetux/Taxol/Carbo, able to finish chemo without complications [id number], C1 D8 Cetux/Taxol/Carbo able to finish chemo without complications [id number] Cetux/Taxol/Carbo, she developed hypotension, nausea and flushing.  Patient was evaluated by nurse practitioner.  Was given IV fluid bolus with improvement of BP and the patient was able to finish infusion successfully.   03/04/2021, C1D22, Cetux/Taxol/Carbo Per note, patient developed hypersensitivity to carboplatin. "tolerated Cetuximab and Paclitaxel infusion without sequelae however during her Carbo infusion, she became acutely altered, momentarily unresponsive, acutely desaturated to 70s, then 80s on RA, hypotensive 81/51 with respiratory wheezing appreciated. The infusion was stopped. NS IVF were given along with pepcid & solumedrol. Patient's vital signs slowly responded but not to baseline. Pulse was thready and skin was cold and clammy. She followed some commands, slowly. A rapid response was called for the acute change in mental status and concerning vital signs. Patient was taken to ER for further evaluation and work up"   03/25/2021 C1 D29 Cetux/Taxol/Cisplatin carboplatin was switched to cisplatin Unfortunately patient developed hypersensitivity to cisplatin as well. Patient missed 5/10, 5/17 treatments due to weakness.   04/10/2019 2 repeat MRI brain with and without contrast showed evidence of disease response, however continues to have CN V involvement with extension to the skull base, unlikely to be a surgical candidate.  Her case was noted to be discussed on the head and neck tumor board at [facility]. Patient was recommended to switch to regimen with pembrolizumab and  cetuximab given the intolerance of cis-platinum  INTERVAL HISTORY Shannon Schroeder is a 65 y.o. female who has above history reviewed by me today presents for  follow up visit for management of head and neck cancer,  Patient tolerates pured food, takes nutrition supplements.  Her weight is stable. Continues to have right facial pain.  On gabapentin,  fentanyl patch 50 MCG/h every 72 hours, patient takes 4-5 Percocet per day, as well Trileptal 300 mg twice daily.  She reports pain is the same.  Patient takes potassium 40 mEq twice daily    Review of Systems  Constitutional:  Positive for fatigue. Negative for appetite change, chills, fever and unexpected weight change.  HENT:   Negative for hearing loss, mouth sores and voice change.        Facial swelling, pain   Eyes:  Negative for eye problems.  Respiratory:  Negative for chest tightness, cough and shortness of breath.   Cardiovascular:  Negative for chest pain and palpitations.  Gastrointestinal:  Negative for abdominal distention, abdominal pain, blood in stool, constipation, nausea and vomiting.       Dysphagia  Endocrine: Negative for hot flashes.  Genitourinary:  Negative for difficulty urinating and frequency.   Musculoskeletal:  Negative for arthralgias.  Skin:  Negative for itching and rash.  Neurological:  Negative for extremity weakness and headaches.  Hematological:  Negative for adenopathy.  Psychiatric/Behavioral:  Negative for confusion.    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    Anxiety    Arthritis    Bipolar disorder (HCC)    COPD (chronic obstructive pulmonary disease) (HCC)    NO INHALERS   Depression    Dysrhythmia    H/O V TACH   Head and neck cancer ([place]) 07/29/2020   Headache    H/O MIGRAINES   Hypertension    Seizures ([place])    X1 AFTER FALL    SURGICAL HISTORY: Past Surgical History:  Procedure Laterality Date   ABDOMINAL HYSTERECTOMY     partial   BREAST SURGERY     biopsy   CARDIAC CATHETERIZATION     X 2   CARPAL TUNNEL RELEASE Right     CARPECTOMY HAND Right    CATARACT EXTRACTION W/PHACO Left 03/08/2018   Procedure: CATARACT EXTRACTION PHACO AND INTRAOCULAR LENS PLACEMENT ([facility]);  Surgeon: Birder Robson, MD;  Location: [facility] ORS;  Service: Ophthalmology;  Laterality: Left;  [place] 00:55 AP% 13.5 CDE 7.52 Fluid pack lot # [id number]   CATARACT EXTRACTION W/PHACO Right 04/05/2018   Procedure: CATARACT EXTRACTION PHACO AND INTRAOCULAR LENS PLACEMENT ([facility]);  Surgeon: Birder Robson, MD;  Location: [facility] ORS;  Service: Ophthalmology;  Laterality: Right;  [place] 00:36 AP% 15.9 CDE 5.72 Fluid pack lot # [id number]   CEREBRAL ANEURYSM REPAIR     CHOLECYSTECTOMY     COLONOSCOPY WITH PROPOFOL N/A 04/26/2019   Procedure: COLONOSCOPY WITH PROPOFOL;  Surgeon: Jonathon Bellows, MD;  Location: [facility] ENDOSCOPY;  Service: Gastroenterology;  Laterality: N/A;   PORTACATH PLACEMENT Left 08/05/2020   Procedure: INSERTION PORT-A-CATH;  Surgeon: Herbert Pun, MD;  Location: [facility] ORS;  Service: General;  Laterality: Left;   TUBAL LIGATION      SOCIAL HISTORY: Social History   Socioeconomic History   Marital status: Divorced    Spouse name: Not on file   Number of children: Not on file   Years of education: 54  Highest education level: Bachelor's degree (e.g., BA, AB, BS)  Occupational History   Occupation: farmer  Tobacco Use   Smoking status: Every Day    Packs/day: 0.25    Years: 50.00    Pack years: 12.50    Types: Cigarettes   Smokeless tobacco: Never  Vaping Use   Vaping Use: Never used  Substance and Sexual Activity   Alcohol use: No   Drug use: No   Sexual activity: Not Currently  Other Topics Concern   Not on file  Social History Narrative   [facility]    Social Determinants of Health   Financial Resource Strain: Not on file  Food Insecurity: Not on file  Transportation Needs: Not on file  Physical Activity: Not on file  Stress: Not on file  Social Connections: Not on file  Intimate Partner  Violence: Not on file    FAMILY HISTORY: Family History  Problem Relation Age of Onset   Hypertension Mother    Heart failure Mother    Hypertension Brother    Stroke Brother    Hypertension Son    Emphysema Maternal Aunt    Hypertension Paternal Aunt    Hypertension Paternal Uncle    Hypertension Maternal Grandmother    Hypertension Paternal Grandmother     ALLERGIES:  is allergic to naproxen, aspirin, belladonna alkaloids, fluoxetine, fluoxetine hcl, paroxetine hcl, phenobarbital, and prozac [fluoxetine hcl].  MEDICATIONS:  Current Outpatient Medications  Medication Sig Dispense Refill   albuterol (VENTOLIN HFA) 108 (90 Base) MCG/ACT inhaler Inhale 2 puffs into the lungs every 6 (six) hours as needed for wheezing or shortness of breath. 8 g 2   amiodarone (PACERONE) 200 MG tablet Take 1 tablet (200 mg total) by mouth daily. 90 tablet 1   apixaban (ELIQUIS) 5 MG TABS tablet Take 1 tablet (5 mg total) by mouth 2 (two) times daily. 180 tablet 3   chlorhexidine (PERIDEX) 0.12 % solution Use as directed 15 mLs in the mouth or throat 2 (two) times daily. 473 mL 6   docusate sodium (COLACE) 100 MG capsule Take 1 capsule (100 mg total) by mouth daily. 30 capsule 1   feeding supplement, ENSURE ENLIVE, (ENSURE ENLIVE) LIQD Take 237 mLs by mouth 3 (three) times daily between meals. 237 mL 12   fentaNYL (DURAGESIC) 75 MCG/HR Place 1 patch onto the skin every 3 (three) days. 10 patch 0   fluticasone-salmeterol (ADVAIR HFA) 45-21 MCG/ACT inhaler Inhale 2 puffs into the lungs 2 (two) times daily. 1 each 3   gabapentin (NEURONTIN) 300 MG capsule Take 2 capsules (600 mg total) by mouth 3 (three) times daily. 180 capsule 1   magnesium chloride (SLOW-MAG) 64 MG TBEC SR tablet Take 1 tablet (64 mg total) by mouth 2 (two) times daily. 60 tablet 1   Melatonin 10 MG TABS Take 10 mg by mouth at bedtime as needed (sleep).     ondansetron (ZOFRAN-ODT) 8 MG disintegrating tablet Take 1 tablet (8 mg total)  by mouth every 8 (eight) hours as needed for nausea or vomiting. 90 tablet 0   Oxcarbazepine (TRILEPTAL) 300 MG tablet Take 1 tablet (300 mg total) by mouth 2 (two) times daily. 60 tablet 1   oxyCODONE-acetaminophen (PERCOCET) 10-325 MG tablet Take 1 tablet by mouth every 6 (six) hours as needed for pain. 120 tablet 0   polyethylene glycol powder (GLYCOLAX/MIRALAX) 17 GM/SCOOP powder Take 17 g by mouth daily as needed for mild constipation or moderate constipation. 255 g  0   potassium chloride SA (KLOR-CON) 20 MEQ tablet Take 2 tablets (40 mEq total) by mouth daily. 60 tablet 1   furosemide (LASIX) 20 MG tablet Take 1 tablet (20 mg total) by mouth daily as needed (for lower extremity swelling). (Patient not taking: No sig reported) 90 tablet 1   mirtazapine (REMERON) 15 MG tablet Take 1 tablet (15 mg total) by mouth at bedtime. (Patient not taking: No sig reported) 30 tablet 0   Multiple Vitamins-Minerals (CENTRUM SILVER PO) Take 1 tablet by mouth daily. Gummie (Patient not taking: No sig reported)     No current facility-administered medications for this visit.   Facility-Administered Medications Ordered in Other Visits  Medication Dose Route Frequency Provider Last Rate Last Admin   heparin lock flush 100 UNIT/ML injection            heparin lock flush 100 unit/mL  500 Units Intravenous Once Earlie Server, MD       sodium chloride flush (NS) 0.9 % injection 10 mL  10 mL Intravenous PRN Earlie Server, MD   10 mL at 10/10/20 1040   sodium chloride flush (NS) 0.9 % injection 10 mL  10 mL Intravenous PRN Earlie Server, MD   10 mL at 11/13/20 0836   sodium chloride flush (NS) 0.9 % injection 10 mL  10 mL Intravenous PRN Earlie Server, MD   10 mL at 12/30/20 0838   sodium chloride flush (NS) 0.9 % injection 10 mL  10 mL Intravenous PRN Earlie Server, MD   10 mL at 01/14/21 0921   sodium chloride flush (NS) 0.9 % injection 10 mL  10 mL Intravenous PRN Earlie Server, MD   10 mL at 06/11/21 0815     PHYSICAL EXAMINATION: ECOG  PERFORMANCE STATUS: 1 - Symptomatic but completely ambulatory Vitals:   07/02/21 1010  BP: 125/76  Pulse: 96  Resp: 16  Temp: (!) 96.9 F (36.1 C)  SpO2: 99%   Filed Weights   07/02/21 1010  Weight: 114 lb (51.7 kg)    Physical Exam Constitutional:      General: She is not in acute distress.    Comments: Thin built.  Patient sits in the wheelchair  HENT:     Head: Normocephalic and atraumatic.     Mouth/Throat:     Comments: Patient wears dentures She has right palate deficiency Right facial swelling and tenderness. Eyes:     General: No scleral icterus.    Comments: Right eyelid ptosis  Cardiovascular:     Rate and Rhythm: Normal rate and regular rhythm.     Heart sounds: Normal heart sounds.  Pulmonary:     Effort: Pulmonary effort is normal. No respiratory distress.     Breath sounds: No wheezing.  Abdominal:     General: Bowel sounds are normal. There is no distension.     Palpations: Abdomen is soft.  Musculoskeletal:        General: No deformity. Normal range of motion.     Cervical back: Normal range of motion and neck supple.  Skin:    General: Skin is warm and dry.     Findings: No erythema or rash.  Neurological:     Mental Status: She is alert and oriented to person, place, and time. Mental status is at baseline.     Cranial Nerves: No cranial nerve deficit.     Coordination: Coordination normal.  Psychiatric:        Mood and Affect: Mood normal.  LABORATORY DATA:  I have reviewed the data as listed Lab Results  Component Value Date   WBC 10.2 07/02/2021   HGB 11.5 (L) 07/02/2021   HCT 35.3 (L) 07/02/2021   MCV 92.7 07/02/2021   PLT 456 (H) 07/02/2021   Recent Labs    07/10/20 1311 07/12/20 1204 08/08/20 0821 04/14/21 1317 04/23/21 0810 06/18/21 0847 06/25/21 0822 07/02/21 0925  NA 136 137   < > 129*   < > 131* 129* 132*  K 2.9* 4.0   < > 4.5   < > 3.2* 3.1* 3.0*  CL 97* 98   < > 92*   < > 96* 92* 95*  CO2 28 27   < > 29   < > '25  29 26  '$ GLUCOSE 100* 114*   < > 106*   < > 106* 108* 124*  BUN 13 8   < > 19   < > 6* 12 7*  CREATININE 0.43* 0.42*   < > 0.46   < > 0.44 0.39* 0.48  CALCIUM 8.9 9.0   < > 9.0   < > 8.7* 8.4* 8.7*  GFRNONAA >60 >60   < > >60   < > >60 >60 >60  GFRAA >60 >60  --   --   --   --   --   --   PROT 7.0  --    < > 7.1   < > 7.1 7.1 7.5  ALBUMIN 3.7  --    < > 3.1*   < > 3.1* 3.2* 3.3*  AST 20  --    < > 15   < > '16 16 17  '$ ALT 16  --    < > 10   < > '11 11 10  '$ ALKPHOS 106  --    < > 115   < > 109 107 107  BILITOT 0.5  --    < > <0.1*   < > 0.2* 0.3 0.4  BILIDIR  --   --   --  <0.1  --   --   --   --   IBILI  --   --   --  NOT CALCULATED  --   --   --   --    < > = values in this interval not displayed.    Iron/TIBC/Ferritin/ %Sat    Component Value Date/Time   IRON 46 09/09/2020 0938   IRON 37 05/03/2018 1826   TIBC 210 (L) 09/09/2020 0938   FERRITIN 136 09/09/2020 0938   FERRITIN 52 05/03/2018 1826   IRONPCTSAT 22 09/09/2020 0938      RADIOGRAPHIC STUDIES: I have personally reviewed the radiological images as listed and agreed with the findings in the report. MR Brain W Wo Contrast  Result Date: 06/27/2021 CLINICAL DATA:  Maxillary sinus cancer. EXAM: MRI HEAD WITHOUT AND WITH CONTRAST TECHNIQUE: Multiplanar, multiecho pulse sequences of the brain and surrounding structures were obtained without and with intravenous contrast. CONTRAST:  62m GADAVIST GADOBUTROL 1 MMOL/ML IV SOLN COMPARISON:  04/09/2021 and earlier FINDINGS: Brain: No evidence of parenchymal or cisternal involvement. No infarct, hemorrhage, hydrocephalus, or collection. 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 Skull and upper cervical spine: Unremarkable prior craniotomy Sinuses/Orbits: Right maxillary sinus cancer with extensive extra spinous spread and surgical treatment. Although a different protocol was used on prior, there is decreased bulk of dominant residual tumor in the right masticator space  which defies reproducible measurement given the  irregular shape and infiltrative appearance. Fairly thin rind of enhancing tissue around the remaining right maxillary sinus is non progressed. Perineural tumor with dural infiltration/thickening overlying the right Meckel's cave and medial middle cranial fossa is unchanged. Tumor over rides the right mandibular foramen without visible tumor infiltration at the level of the edentulous mandible. The orbits appear clear. IMPRESSION: 1. Positive treatment response with decreasing tumor bulk in the right masticator space. Known perineural tumor spread with dural thickening at the right middle cranial fossa that is nonprogressive. 2. No evidence of cisternal or parenchymal brain involvement. 3. At future follow-up imaging, recommend trigeminal nerve protocol or facial protocol rather than standard brain MRI. Electronically Signed   By: Monte Fantasia M.D.   On: 06/27/2021 18:06       ASSESSMENT & PLAN:  1. 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[place])   2. Neoplasm related pain   3. Trigeminal neuropathy   4. Moderate protein-calorie malnutrition ([place])   5. Encounter for antineoplastic immunotherapy    #maxillary sinus squamous cell carcinoma, s/p concurrent chemotherapy and radiation-recurrence-Unresectable  Labs are reviewed and discussed with patient. Proceed with cycle 4 D8 Cetuxiamab  8/262022 MRI brain showed positive treatment response with decreasing tumor bulk in the right masticator space. Known perineural tumor spread with dural thickening at the right middle cranial fossa that is nonprogressive.  No evidence of cisternal or parenchymal brain involvement.  #Neoplasm related pain Continue fentanyl patch to 75 MCG/h every 72 hours, continue Percocet 10/325 every 4-6 hours as needed.   Continue gabapentin 600 mg 3 times daily.  Continue Trileptal 300 mg twice daily recommended by Dr. Mickeal Skinner. Patient has an upcoming appointment with Dr.  Baruch Gouty.   #Malnutrition, dysphagia She appears having better oral intake and has gained weight.  We will hold off tube placement at this point.  Continue nutrition supplementation.  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 Na level is stable132 Liberate dietary salt intake.  Proceed with 1 L of IV fluid plus IV potassium chloride 20 mEq x 1 today.  #Hypokalemia, K is 3.0 continue potassium chloride 61mq BID.  IV KCL 272m x 1 today # Hypomagnesia, magnesium 1.8.  Continue Slow-Mag 1 tablet daily.    #Atrial fibrillation on amiodarone and metoprolol.Eliquis  All questions were answered. The patient knows to call the clinic with any problems questions or concerns.   Return of visit:   1 week for lab md cycle 4 D15 cetuximab  ZhEarlie ServerMD, PhD Hematology Oncology CoSur[facility]Ct AlSouthern Surgical Hospitalager- 33IE:[id number]/31/2022

## 2021-07-03 ENCOUNTER — Other Ambulatory Visit: Payer: Self-pay | Admitting: Oncology

## 2021-07-04 ENCOUNTER — Other Ambulatory Visit: Payer: Self-pay | Admitting: Oncology

## 2021-07-08 ENCOUNTER — Other Ambulatory Visit: Payer: Self-pay

## 2021-07-08 ENCOUNTER — Ambulatory Visit
Admission: RE | Admit: 2021-07-08 | Discharge: 2021-07-08 | Disposition: A | Discharge status: Home / Self Care | Payer: Medicare Other | Source: Ambulatory Visit | Attending: Radiation Oncology | Admitting: Radiation Oncology

## 2021-07-08 ENCOUNTER — Inpatient Hospital Stay: Payer: Medicare Other | Admitting: Hospice and Palliative Medicine

## 2021-07-08 DIAGNOSIS — I700 Atherosclerosis of aorta: Secondary | ICD-10-CM | POA: Diagnosis not present

## 2021-07-08 DIAGNOSIS — C760 Malignant neoplasm of head, face and neck: Secondary | ICD-10-CM | POA: Diagnosis present

## 2021-07-08 LAB — GLUCOSE, CAPILLARY: Glucose-Capillary: 91 mg/dL (ref 70–99)

## 2021-07-08 IMAGING — CT NM PET TUM IMG RESTAG (PS) SKULL BASE T - THIGH
9 series · 25 of 25 positions shown · non-contrast
Comparison: CT abdomen [DATE] and PET [DATE]. CT chest
[DATE].

CLINICAL DATA: Subsequent treatment strategy for head/neck cancer.

EXAM:
NUCLEAR MEDICINE PET SKULL BASE TO THIGH
TECHNIQUE: 6.1 mCi F-18 FDG was injected intravenously. Full-ring PET imaging
was performed from the skull base to thigh after the radiotracer. CT
data was obtained and used for attenuation correction and anatomic
localization.
Fasting blood glucose: 91 mg/dl

[Series 3: ct wb 5.0 b30f · axial · 5.0mm · 0.98mm/px · z∈[-288,+578]mm · 3 of 290 slices shown]
[im 1/290]
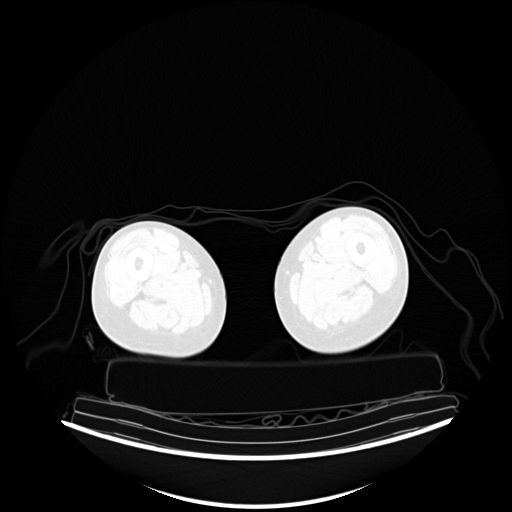
[im 145/290]
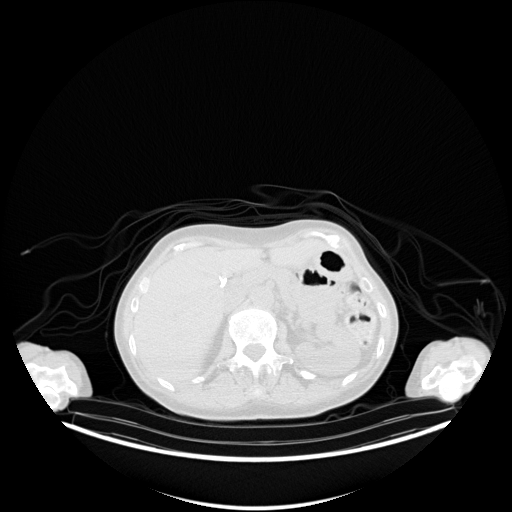
[im 290/290  brain]
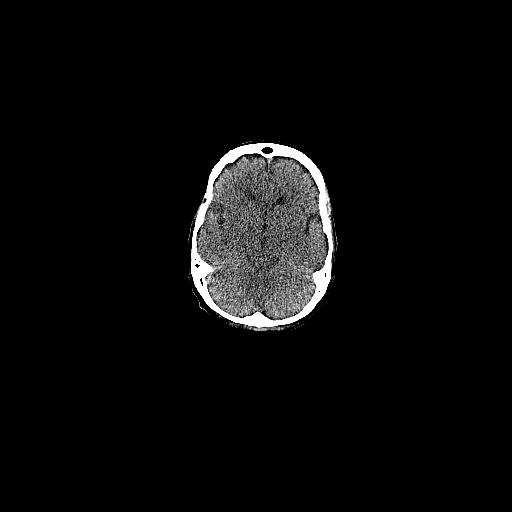

[Series 5: pet wb uncorrected (nac) · axial · 5.0mm · 4.07mm/px · z∈[-288,+578]mm · 4 of 290 slices shown]
[im 1/290]
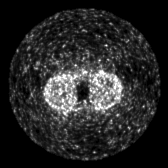
[im 97/290]
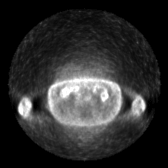
[im 193/290]
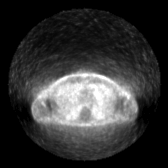
[im 290/290]
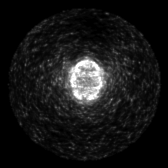

[Series 6: pet wb (ac) · axial · 5.0mm · 2.72mm/px · z∈[-288,+578]mm · 4 of 290 slices shown]
[im 1/290]
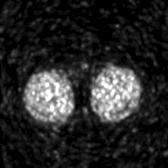
[im 97/290]
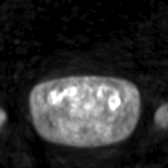
[im 193/290]
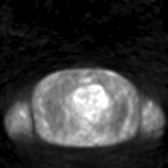
[im 290/290]
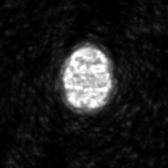

[Series 603: fused axial · 4 of 287 slices shown]
[im 1/287]
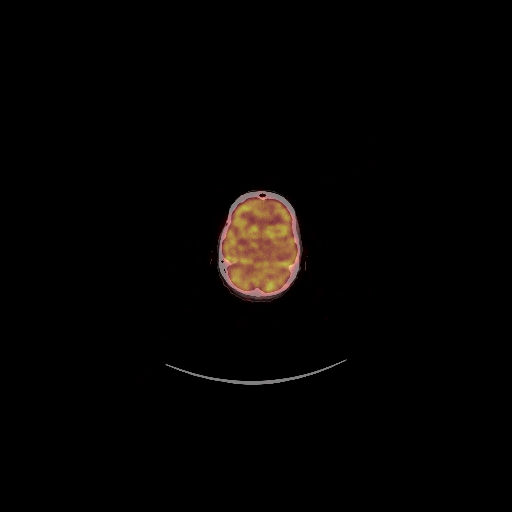
[im 96/287]
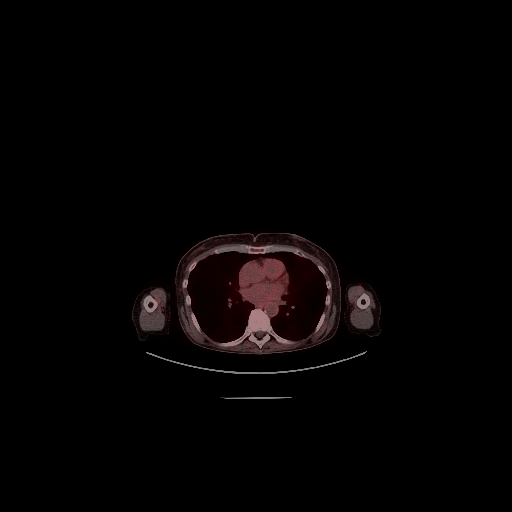
[im 191/287]
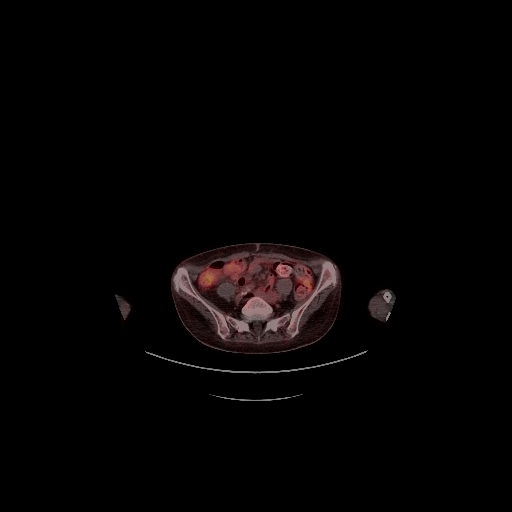
[im 287/287]
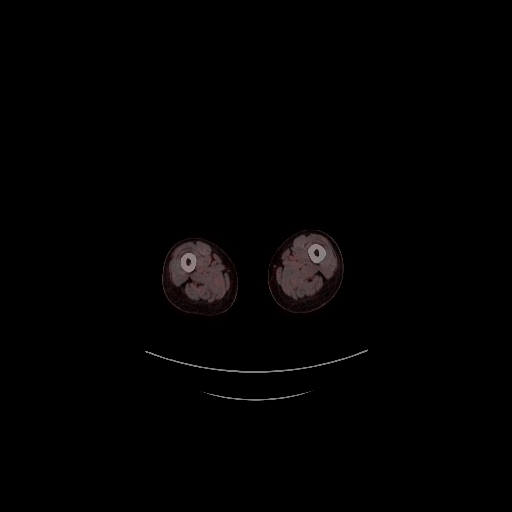

[Series 604: fused coronal · 1 of 89 slices shown]
[im 1/89]
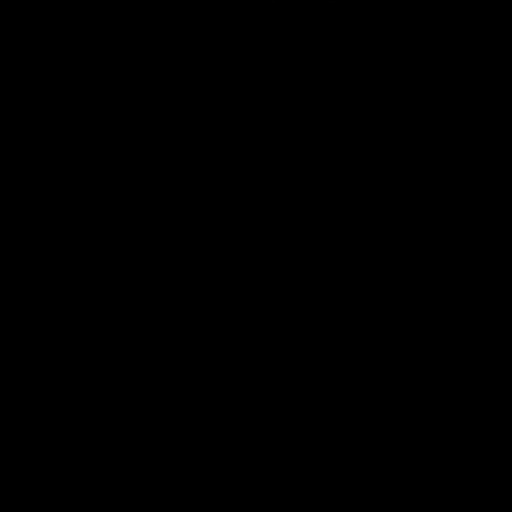

[Series 605: fused sagittal · 2 of 164 slices shown]
[im 1/164]
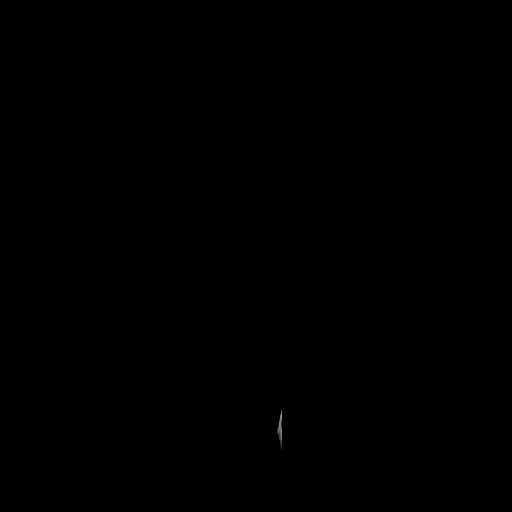
[im 164/164]
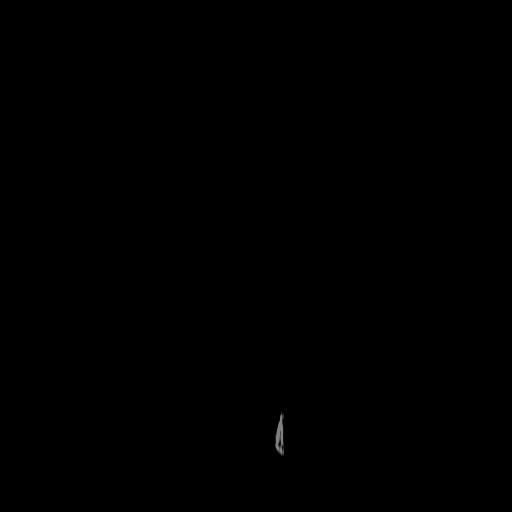

[Series 606: pet axial · 4 of 290 slices shown]
[im 1/290]
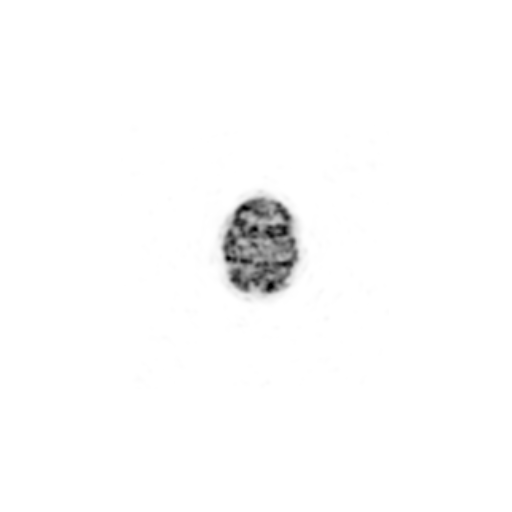
[im 97/290]
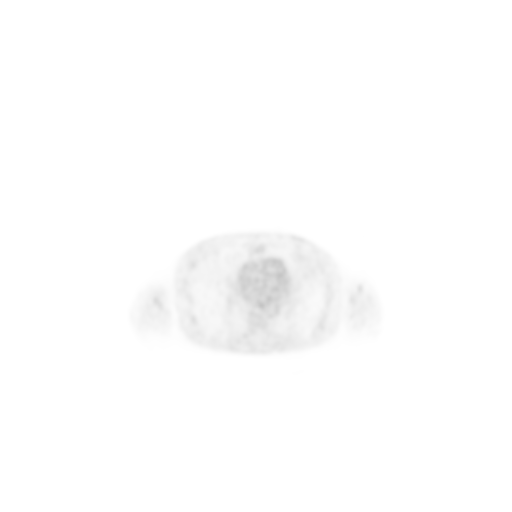
[im 193/290]
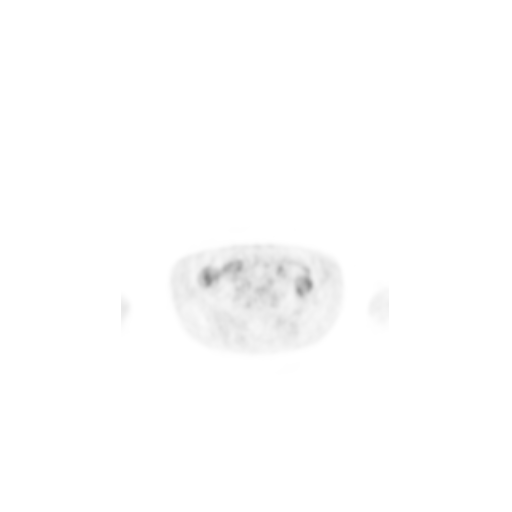
[im 290/290]
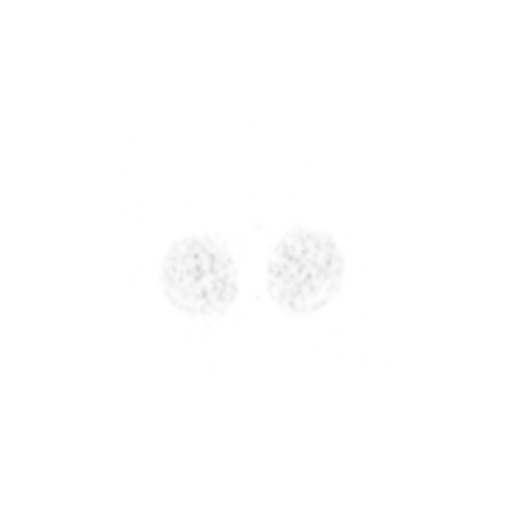

[Series 607: pet coronal · 1 of 108 slices shown]
[im 1/108]
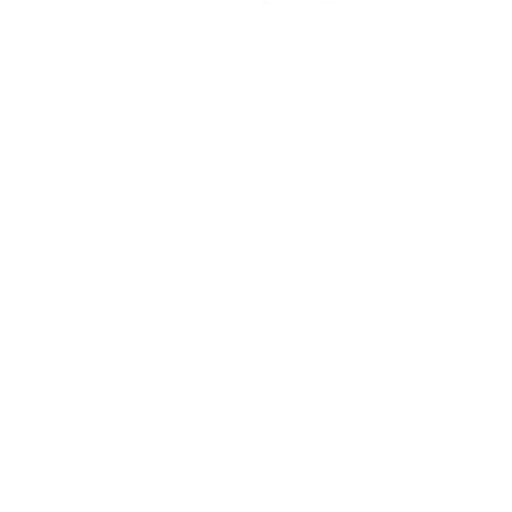

[Series 608: pet sagittal · 2 of 152 slices shown]
[im 1/152]
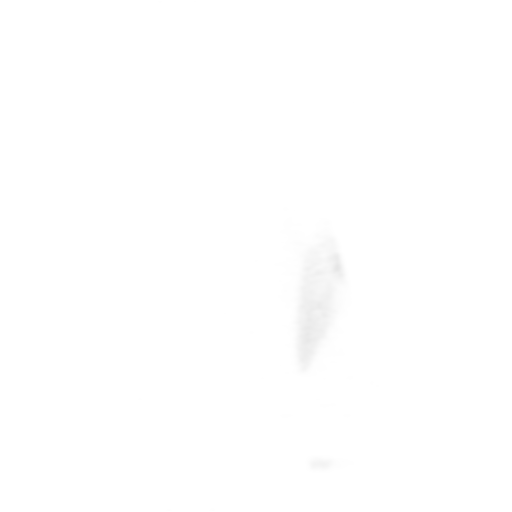
[im 152/152]
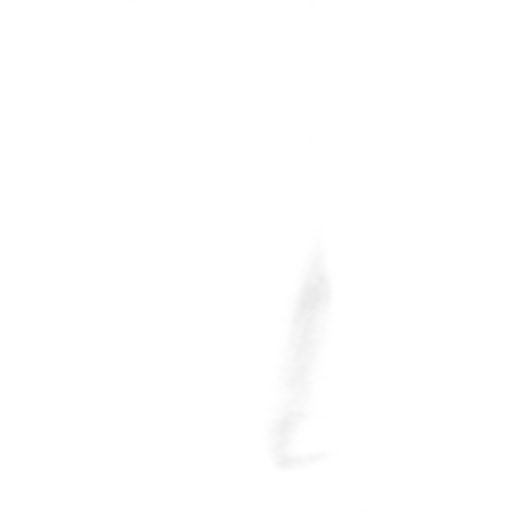

[25 of 25 positions shown; findings below may reference images not displayed]

FINDINGS: Mediastinal blood pool activity: SUV max

Liver activity: SUV max NA

NECK:

Cavitary mass in the right maxillary sinus extends to the right
nasopharynx, appears less thick walled than on [DATE] and has
minimal residual peripheral hypermetabolism, SUV max 4.7 laterally.
No hypermetabolic lymph nodes.

Incidental CT findings:

Associated osseous destruction surrounding the right maxillary
sinus.

CHEST:

No hypermetabolic mediastinal, hilar or axillary lymph nodes. No
hypermetabolic pulmonary nodules.

Incidental CT findings:

Left IJ Port-A-Cath terminates in the SVC. Heart is at the upper
limits of normal in size. No pericardial or pleural effusion.
Basilar predominant peribronchovascular ground-glass and mild septal
thickening.

ABDOMEN/PELVIS:

No abnormal hypermetabolism in the liver, adrenal glands, spleen or
pancreas. No hypermetabolic lymph nodes.

Incidental CT findings:

Liver is unremarkable. Cholecystectomy. Adrenal glands, kidneys,
spleen, pancreas, stomach and bowel are grossly unremarkable.
Atherosclerotic calcification of the aorta.

SKELETON:

No abnormal osseous hypermetabolism. Very mild hypermetabolism
associated with a fracture of the superior endplate of L1 is new
from [DATE].

Incidental CT findings:

Degenerative changes in the spine.
IMPRESSION: 1. Continued response to therapy as evidenced by decreased wall
thickening and hypermetabolism associated with a cavitary right
maxillary/nasopharyngeal mass. No evidence of distant metastatic
disease.
2. Subacute compression of the L1 superior endplate, new from
[DATE].
3.  Aortic atherosclerosis ([D6]-[D6]).

## 2021-07-08 MED ORDER — FLUDEOXYGLUCOSE F - 18 (FDG) INJECTION
5.9000 | Freq: Once | INTRAVENOUS | Status: AC | PRN
  Administered 2021-07-08: 6.11 via INTRAVENOUS

## 2021-07-09 ENCOUNTER — Inpatient Hospital Stay: Payer: Medicare Other

## 2021-07-09 ENCOUNTER — Inpatient Hospital Stay: Payer: Medicare Other | Attending: Oncology

## 2021-07-09 ENCOUNTER — Telehealth: Payer: Self-pay | Admitting: *Deleted

## 2021-07-09 ENCOUNTER — Inpatient Hospital Stay (HOSPITAL_BASED_OUTPATIENT_CLINIC_OR_DEPARTMENT_OTHER): Payer: Medicare Other | Admitting: Oncology

## 2021-07-09 ENCOUNTER — Inpatient Hospital Stay (HOSPITAL_BASED_OUTPATIENT_CLINIC_OR_DEPARTMENT_OTHER): Payer: Medicare Other | Admitting: Hospice and Palliative Medicine

## 2021-07-09 ENCOUNTER — Encounter: Payer: Self-pay | Admitting: Oncology

## 2021-07-09 VITALS — BP 147/84 | HR 97 | Temp 97.1°F | Resp 18 | Wt 118.3 lb

## 2021-07-09 DIAGNOSIS — Z79899 Other long term (current) drug therapy: Secondary | ICD-10-CM | POA: Diagnosis not present

## 2021-07-09 DIAGNOSIS — C760 Malignant neoplasm of head, face and neck: Secondary | ICD-10-CM | POA: Diagnosis not present

## 2021-07-09 DIAGNOSIS — Z8673 Personal history of transient ischemic attack (TIA), and cerebral infarction without residual deficits: Secondary | ICD-10-CM | POA: Insufficient documentation

## 2021-07-09 DIAGNOSIS — G4700 Insomnia, unspecified: Secondary | ICD-10-CM | POA: Insufficient documentation

## 2021-07-09 DIAGNOSIS — E876 Hypokalemia: Secondary | ICD-10-CM | POA: Insufficient documentation

## 2021-07-09 DIAGNOSIS — E871 Hypo-osmolality and hyponatremia: Secondary | ICD-10-CM | POA: Insufficient documentation

## 2021-07-09 DIAGNOSIS — F17210 Nicotine dependence, cigarettes, uncomplicated: Secondary | ICD-10-CM | POA: Diagnosis not present

## 2021-07-09 DIAGNOSIS — E440 Moderate protein-calorie malnutrition: Secondary | ICD-10-CM | POA: Insufficient documentation

## 2021-07-09 DIAGNOSIS — F419 Anxiety disorder, unspecified: Secondary | ICD-10-CM | POA: Diagnosis not present

## 2021-07-09 DIAGNOSIS — C310 Malignant neoplasm of maxillary sinus: Secondary | ICD-10-CM

## 2021-07-09 DIAGNOSIS — Z515 Encounter for palliative care: Secondary | ICD-10-CM | POA: Diagnosis not present

## 2021-07-09 DIAGNOSIS — G893 Neoplasm related pain (acute) (chronic): Secondary | ICD-10-CM | POA: Insufficient documentation

## 2021-07-09 DIAGNOSIS — Z923 Personal history of irradiation: Secondary | ICD-10-CM | POA: Insufficient documentation

## 2021-07-09 DIAGNOSIS — Z5112 Encounter for antineoplastic immunotherapy: Secondary | ICD-10-CM | POA: Diagnosis present

## 2021-07-09 DIAGNOSIS — I4891 Unspecified atrial fibrillation: Secondary | ICD-10-CM | POA: Insufficient documentation

## 2021-07-09 DIAGNOSIS — J449 Chronic obstructive pulmonary disease, unspecified: Secondary | ICD-10-CM | POA: Insufficient documentation

## 2021-07-09 DIAGNOSIS — G509 Disorder of trigeminal nerve, unspecified: Secondary | ICD-10-CM

## 2021-07-09 DIAGNOSIS — R1310 Dysphagia, unspecified: Secondary | ICD-10-CM | POA: Insufficient documentation

## 2021-07-09 DIAGNOSIS — F32A Depression, unspecified: Secondary | ICD-10-CM | POA: Insufficient documentation

## 2021-07-09 DIAGNOSIS — I10 Essential (primary) hypertension: Secondary | ICD-10-CM | POA: Diagnosis not present

## 2021-07-09 DIAGNOSIS — Z8679 Personal history of other diseases of the circulatory system: Secondary | ICD-10-CM

## 2021-07-09 DIAGNOSIS — Z79891 Long term (current) use of opiate analgesic: Secondary | ICD-10-CM | POA: Diagnosis not present

## 2021-07-09 DIAGNOSIS — E46 Unspecified protein-calorie malnutrition: Secondary | ICD-10-CM | POA: Diagnosis not present

## 2021-07-09 DIAGNOSIS — F319 Bipolar disorder, unspecified: Secondary | ICD-10-CM | POA: Diagnosis not present

## 2021-07-09 DIAGNOSIS — I671 Cerebral aneurysm, nonruptured: Secondary | ICD-10-CM | POA: Insufficient documentation

## 2021-07-09 DIAGNOSIS — Z7901 Long term (current) use of anticoagulants: Secondary | ICD-10-CM | POA: Insufficient documentation

## 2021-07-09 LAB — CBC WITH DIFFERENTIAL/PLATELET
Abs Immature Granulocytes: 0.03 10*3/uL (ref 0.00–0.07)
Basophils Absolute: 0.1 10*3/uL (ref 0.0–0.1)
Basophils Relative: 1 %
Eosinophils Absolute: 0.5 10*3/uL (ref 0.0–0.5)
Eosinophils Relative: 5 %
HCT: 36.3 % (ref 36.0–46.0)
Hemoglobin: 11.7 g/dL — ABNORMAL LOW (ref 12.0–15.0)
Immature Granulocytes: 0 %
Lymphocytes Relative: 8 %
Lymphs Abs: 0.7 10*3/uL (ref 0.7–4.0)
MCH: 29.7 pg (ref 26.0–34.0)
MCHC: 32.2 g/dL (ref 30.0–36.0)
MCV: 92.1 fL (ref 80.0–100.0)
Monocytes Absolute: 1.1 10*3/uL — ABNORMAL HIGH (ref 0.1–1.0)
Monocytes Relative: 13 %
Neutro Abs: 6 10*3/uL (ref 1.7–7.7)
Neutrophils Relative %: 73 %
Platelets: 483 10*3/uL — ABNORMAL HIGH (ref 150–400)
RBC: 3.94 MIL/uL (ref 3.87–5.11)
RDW: 15.1 % (ref 11.5–15.5)
WBC: 8.3 10*3/uL (ref 4.0–10.5)
nRBC: 0 % (ref 0.0–0.2)

## 2021-07-09 LAB — COMPREHENSIVE METABOLIC PANEL
ALT: 12 U/L (ref 0–44)
AST: 18 U/L (ref 15–41)
Albumin: 3.3 g/dL — ABNORMAL LOW (ref 3.5–5.0)
Alkaline Phosphatase: 108 U/L (ref 38–126)
Anion gap: 8 (ref 5–15)
BUN: 13 mg/dL (ref 8–23)
CO2: 28 mmol/L (ref 22–32)
Calcium: 8.9 mg/dL (ref 8.9–10.3)
Chloride: 91 mmol/L — ABNORMAL LOW (ref 98–111)
Creatinine, Ser: <0.3 mg/dL — ABNORMAL LOW (ref 0.44–1.00)
Glucose, Bld: 98 mg/dL (ref 70–99)
Potassium: 4 mmol/L (ref 3.5–5.1)
Sodium: 127 mmol/L — ABNORMAL LOW (ref 135–145)
Total Bilirubin: <0.1 mg/dL — ABNORMAL LOW (ref 0.3–1.2)
Total Protein: 7.6 g/dL (ref 6.5–8.1)

## 2021-07-09 LAB — MAGNESIUM: Magnesium: 2.1 mg/dL (ref 1.7–2.4)

## 2021-07-09 MED ORDER — CETUXIMAB CHEMO IV INJECTION 200 MG/100ML
250.0000 mg/m2 | Freq: Once | INTRAVENOUS | Status: AC
  Administered 2021-07-09: 400 mg via INTRAVENOUS
  Filled 2021-07-09: qty 200

## 2021-07-09 MED ORDER — HEPARIN SOD (PORK) LOCK FLUSH 100 UNIT/ML IV SOLN
500.0000 [IU] | Freq: Once | INTRAVENOUS | Status: AC | PRN
  Filled 2021-07-09: qty 5

## 2021-07-09 MED ORDER — DIPHENHYDRAMINE HCL 50 MG/ML IJ SOLN
50.0000 mg | Freq: Once | INTRAMUSCULAR | Status: AC
  Administered 2021-07-09: 50 mg via INTRAVENOUS
  Filled 2021-07-09: qty 1

## 2021-07-09 MED ORDER — SODIUM CHLORIDE 0.9 % IV SOLN
Freq: Once | INTRAVENOUS | Status: AC
  Filled 2021-07-09: qty 250

## 2021-07-09 MED ORDER — FENTANYL 75 MCG/HR TD PT72: 1.0000 | MEDICATED_PATCH | TRANSDERMAL | 0 refills | Status: DC

## 2021-07-09 MED ORDER — HEPARIN SOD (PORK) LOCK FLUSH 100 UNIT/ML IV SOLN
INTRAVENOUS | Status: AC
  Filled 2021-07-09: qty 5

## 2021-07-09 MED ORDER — TRAZODONE HCL 50 MG PO TABS: 50.0000 mg | ORAL_TABLET | Freq: Every evening | ORAL | 2 refills | Status: DC | PRN

## 2021-07-09 MED ORDER — GABAPENTIN 300 MG PO CAPS: 600.0000 mg | ORAL_CAPSULE | Freq: Three times a day (TID) | ORAL | 1 refills | Status: DC

## 2021-07-09 MED ORDER — OXYCODONE HCL 10 MG PO TABS: 10.0000 mg | ORAL_TABLET | ORAL | 0 refills | Status: DC | PRN

## 2021-07-09 MED ORDER — MONTELUKAST SODIUM 10 MG PO TABS
10.0000 mg | ORAL_TABLET | Freq: Once | ORAL | Status: AC
  Administered 2021-07-09: 10 mg via ORAL
  Filled 2021-07-09: qty 1

## 2021-07-09 MED ORDER — FAMOTIDINE 20 MG IN NS 100 ML IVPB
20.0000 mg | Freq: Once | INTRAVENOUS | Status: AC
  Administered 2021-07-09: 20 mg via INTRAVENOUS
  Filled 2021-07-09: qty 20

## 2021-07-09 MED ORDER — HEPARIN SOD (PORK) LOCK FLUSH 100 UNIT/ML IV SOLN
500.0000 [IU] | Freq: Once | INTRAVENOUS | Status: AC
  Administered 2021-07-09: 500 [IU] via INTRAVENOUS
  Filled 2021-07-09: qty 5

## 2021-07-09 MED ORDER — SODIUM CHLORIDE 0.9% FLUSH
10.0000 mL | INTRAVENOUS | Status: AC | PRN
  Administered 2021-07-09: 10 mL via INTRAVENOUS
  Filled 2021-07-09: qty 10

## 2021-07-09 MED ORDER — SODIUM CHLORIDE 0.9 % IV SOLN
10.0000 mg | Freq: Once | INTRAVENOUS | Status: AC
  Administered 2021-07-09: 10 mg via INTRAVENOUS
  Filled 2021-07-09: qty 10

## 2021-07-09 NOTE — Progress Notes (Signed)
[place]  [phone] [phone]   Name: Shannon Schroeder Date: 07/09/2021 MRN: [id number]  DOB: 10-31-1956  Patient Care Team: Pcp, No as PCP - General Rockey Situ Kathlene November, MD as PCP - Cardiology (Cardiology) Earlie Server, MD as Consulting Physician (Oncology)    REASON FOR CONSULTATION: Shannon Schroeder is a 65 y.o. 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  MRI of the brain on 07/17/2020 revealed large right facial mass with right maxillary erosion and perineural spread along multiple branches of the maxillary division and right trigeminal nerve.  Patient was referred to palliative care to help address goals and manage ongoing symptoms such as pain.  SOCIAL HISTORY:     reports that she has been smoking cigarettes. She has a 12.50 pack-year smoking history. She has never used smokeless tobacco. She reports that she does not drink alcohol and does not use drugs.   Patient is unmarried.  She has children from whom she is estranged.  She lives with a roommate and feels like she has good social support.  She worked at [employer] in [employer].  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   Headache    H/O MIGRAINES   Hypertension    Seizures ([place])    X1 AFTER FALL    PAST SURGICAL HISTORY:  Past Surgical History:  Procedure  Laterality Date   ABDOMINAL HYSTERECTOMY     partial   BREAST SURGERY     biopsy   CARDIAC CATHETERIZATION     X 2   CARPAL TUNNEL RELEASE Right    CARPECTOMY HAND Right    CATARACT EXTRACTION W/PHACO Left 03/08/2018   Procedure: CATARACT EXTRACTION PHACO AND INTRAOCULAR LENS PLACEMENT ([place]);  Surgeon: Birder Robson, MD;  Location: [facility] ORS;  Service: Ophthalmology;  Laterality: Left;  [place] 00:55 AP% 13.5 CDE 7.52 Fluid pack lot # [id number]   CATARACT EXTRACTION W/PHACO Right 04/05/2018   Procedure: CATARACT EXTRACTION PHACO AND INTRAOCULAR LENS PLACEMENT ([facility]);  Surgeon: Birder Robson, MD;  Location: [facility] ORS;  Service: Ophthalmology;  Laterality: Right;  [place] 00:36 AP% 15.9 CDE 5.72 Fluid pack lot # [id number]   CEREBRAL ANEURYSM REPAIR     CHOLECYSTECTOMY     COLONOSCOPY WITH PROPOFOL N/A 04/26/2019   Procedure: COLONOSCOPY WITH PROPOFOL;  Surgeon: Jonathon Bellows, MD;  Location: [facility];  Service: Gastroenterology;  Laterality: N/A;   PORTACATH PLACEMENT Left 08/05/2020   Procedure: INSERTION PORT-A-CATH;  Surgeon: Herbert Pun, MD;  Location: [facility] ORS;  Service: General;  Laterality: Left;   TUBAL LIGATION      HEMATOLOGY/ONCOLOGY HISTORY:  Oncology History  Head and neck cancer ([place])  07/29/2020 Initial Diagnosis   Head and neck cancer ([place])   08/08/2020 - 08/08/2020 Chemotherapy   The patient had dexamethasone (DECADRON) 4 MG tablet, 8 mg,  Oral, Daily, 0 of 1 cycle, Start date: --, End date: -- palonosetron (ALOXI) injection 0.25 mg, 0.25 mg, Intravenous,  Once, 0 of 3 cycles CISplatin (PLATINOL) 166 mg in sodium chloride 0.9 % 500 mL chemo infusion, 100 mg/m2, Intravenous,  Once, 0 of 3 cycles fosaprepitant (EMEND) 150 mg in sodium chloride 0.9 % 145 mL IVPB, 150 mg, Intravenous,  Once, 0 of 3 cycles  for chemotherapy treatment.    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  MEDICATIONS:  Current Outpatient Medications  Medication Sig Dispense Refill   albuterol (VENTOLIN HFA) 108 (90 Base) MCG/ACT inhaler Inhale 2 puffs into the lungs every 6 (six) hours as needed for wheezing or shortness of breath. 8 g 2   amiodarone (PACERONE) 200 MG tablet Take 1 tablet (200 mg total) by mouth daily. 90 tablet 1   apixaban (ELIQUIS) 5 MG TABS tablet Take 1 tablet (5 mg total) by mouth 2 (two) times daily. 180 tablet 3   chlorhexidine (PERIDEX) 0.12 % solution Use as directed 15 mLs in the mouth or throat 2 (two) times daily. 473 mL 6   docusate sodium (COLACE) 100 MG capsule Take 1 capsule (100 mg total) by mouth daily. 30 capsule 1   feeding supplement, ENSURE ENLIVE, (ENSURE ENLIVE) LIQD Take 237 mLs by mouth 3 (three) times daily between meals. 237 mL 12   fentaNYL (DURAGESIC) 75 MCG/HR Place 1 patch onto the skin every 3 (three) days. 10 patch 0   fluticasone-salmeterol (ADVAIR HFA) 45-21 MCG/ACT inhaler Inhale 2 puffs into the lungs 2 (two) times daily. 1 each 3   furosemide (LASIX) 20 MG tablet Take 1 tablet (20 mg total) by mouth daily as needed (for lower extremity swelling). (Patient not taking: No sig reported) 90 tablet 1   gabapentin (NEURONTIN) 300 MG capsule Take 2 capsules (600 mg total) by mouth 3 (three) times daily. 180 capsule 1   magnesium chloride (SLOW-MAG) 64 MG TBEC SR tablet Take 1 tablet (64 mg total) by mouth 2 (two) times daily. 60 tablet 1   Melatonin 10 MG TABS Take 10 mg by mouth at bedtime as needed (sleep).     mirtazapine  (REMERON) 15 MG tablet Take 1 tablet (15 mg total) by mouth at bedtime. (Patient not taking: No sig reported) 30 tablet 0   Multiple Vitamins-Minerals (CENTRUM SILVER PO) Take 1 tablet by mouth daily. Gummie (Patient not taking: No sig reported)     ondansetron (ZOFRAN-ODT) 8 MG disintegrating tablet Take 1 tablet (8 mg total) by mouth every 8 (eight) hours as needed for nausea or vomiting. 90 tablet 0   Oxcarbazepine (TRILEPTAL) 300 MG tablet Take 1 tablet (300 mg total) by mouth 2 (two) times daily. 60 tablet 1   oxyCODONE-acetaminophen (PERCOCET) 10-325 MG tablet Take 1 tablet by mouth every 6 (six) hours as needed for pain. 120 tablet 0   polyethylene glycol powder (GLYCOLAX/MIRALAX) 17 GM/SCOOP powder Take 17 g by mouth daily  as needed for mild constipation or moderate constipation. 255 g 0   potassium chloride SA (KLOR-CON) 20 MEQ tablet Take 2 tablets (40 mEq total) by mouth daily. 60 tablet 1   No current facility-administered medications for this visit.   Facility-Administered Medications Ordered in Other Visits  Medication Dose Route Frequency Provider Last Rate Last Admin   heparin lock flush 100 UNIT/ML injection            heparin lock flush 100 unit/mL  500 Units Intravenous Once Earlie Server, MD       heparin lock flush 100 unit/mL  500 Units Intravenous Once Earlie Server, MD       heparin lock flush 100 unit/mL  500 Units Intracatheter Once PRN Earlie Server, MD       sodium chloride flush (NS) 0.9 % injection 10 mL  10 mL Intravenous PRN Earlie Server, MD   10 mL at 10/10/20 1040   sodium chloride flush (NS) 0.9 % injection 10 mL  10 mL Intravenous PRN Earlie Server, MD   10 mL at 11/13/20 0836   sodium chloride flush (NS) 0.9 % injection 10 mL  10 mL Intravenous PRN Earlie Server, MD   10 mL at 12/30/20 0838   sodium chloride flush (NS) 0.9 % injection 10 mL  10 mL Intravenous PRN Earlie Server, MD   10 mL at 01/14/21 0921   sodium chloride flush (NS) 0.9 % injection 10 mL  10 mL Intravenous PRN Earlie Server, MD    10 mL at 06/11/21 0815   sodium chloride flush (NS) 0.9 % injection 10 mL  10 mL Intravenous PRN Earlie Server, MD   10 mL at 07/09/21 0814    VITAL SIGNS: LMP 11/19/1988 (Exact Date)  There were no vitals filed for this visit.  Estimated body mass index is 19.09 kg/m as calculated from the following:   Height as of 11/25/20: '5\' 6"'$  (1.676 m).   Weight as of an earlier encounter on 07/09/21: 118 lb 4.8 oz (53.7 kg).  LABS: CBC:    Component Value Date/Time   WBC 8.3 07/09/2021 0814   HGB 11.7 (L) 07/09/2021 0814   HGB 13.8 03/15/2019 1201   HCT 36.3 07/09/2021 0814   HCT 39.0 03/15/2019 1201   HCT 36 12/18/2016 0000   PLT 483 (H) 07/09/2021 0814   PLT 325 03/15/2019 1201   MCV 92.1 07/09/2021 0814   MCV 96 03/15/2019 1201   MCV 98 12/18/2016 0000   NEUTROABS 6.0 07/09/2021 0814   NEUTROABS 2.4 05/03/2018 1826   LYMPHSABS 0.7 07/09/2021 0814   LYMPHSABS 1.6 05/03/2018 1826   MONOABS 1.1 (H) 07/09/2021 0814   EOSABS 0.5 07/09/2021 0814   EOSABS 0.2 05/03/2018 1826   BASOSABS 0.1 07/09/2021 0814   BASOSABS 0.1 05/03/2018 1826   BASOSABS 2 12/18/2016 0000   Comprehensive Metabolic Panel:    Component Value Date/Time   NA 127 (L) 07/09/2021 0814   NA 135 03/15/2019 1201   K 4.0 07/09/2021 0814   K 4.2 06/26/2015 0000   CL 91 (L) 07/09/2021 0814   CL 91 05/25/2016 0000   CO2 28 07/09/2021 0814   CO2 25 08/19/2016 0000   BUN 13 07/09/2021 0814   BUN 12 03/15/2019 1201   BUN 11 12/18/2016 0000   CREATININE <0.30 (L) 07/09/2021 0814   CREATININE 0.67 12/18/2016 0000   GLUCOSE 98 07/09/2021 0814   CALCIUM 8.9 07/09/2021 0814   CALCIUM 8.9 12/18/2016 0000   AST 18  07/09/2021 0814   AST 15 12/18/2016 0000   ALT 12 07/09/2021 0814   ALT 11 08/19/2016 0000   ALKPHOS 108 07/09/2021 0814   ALKPHOS 117 12/18/2016 0000   BILITOT <0.1 (L) 07/09/2021 0814   BILITOT <0.2 03/15/2019 1201   BILITOT 0.3 12/18/2016 0000   PROT 7.6 07/09/2021 0814   PROT 6.8 03/15/2019 1201   ALBUMIN  3.3 (L) 07/09/2021 0814   ALBUMIN 4.5 03/15/2019 1201   ALBUMIN 4.5 12/18/2016 0000    RADIOGRAPHIC STUDIES: MR Brain W Wo Contrast  Result Date: 06/27/2021 CLINICAL DATA:  Maxillary sinus cancer. EXAM: MRI HEAD WITHOUT AND WITH CONTRAST TECHNIQUE: Multiplanar, multiecho pulse sequences of the brain and surrounding structures were obtained without and with intravenous contrast. CONTRAST:  70m GADAVIST GADOBUTROL 1 MMOL/ML IV SOLN COMPARISON:  04/09/2021 and earlier FINDINGS: Brain: No evidence of parenchymal or cisternal involvement. No infarct, hemorrhage, hydrocephalus, or collection. 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 Skull and upper cervical spine: Unremarkable prior craniotomy Sinuses/Orbits: Right maxillary sinus cancer with extensive extra spinous spread and surgical treatment. Although a different protocol was used on prior, there is decreased bulk of dominant residual tumor in the right masticator space which defies reproducible measurement given the irregular shape and infiltrative appearance. Fairly thin rind of enhancing tissue around the remaining right maxillary sinus is non progressed. Perineural tumor with dural infiltration/thickening overlying the right Meckel's cave and medial middle cranial fossa is unchanged. Tumor over rides the right mandibular foramen without visible tumor infiltration at the level of the edentulous mandible. The orbits appear clear. IMPRESSION: 1. Positive treatment response with decreasing tumor bulk in the right masticator space. Known perineural tumor spread with dural thickening at the right middle cranial fossa that is nonprogressive. 2. No evidence of cisternal or parenchymal brain involvement. 3. At future follow-up imaging, recommend trigeminal nerve protocol or facial protocol rather than standard brain MRI. Electronically Signed   By: JMonte FantasiaM.D.   On: 06/27/2021 18:06     PERFORMANCE STATUS (ECOG) : 1 - Symptomatic  but completely ambulatory  Review of Systems Unless otherwise noted, a complete review of systems is negative.  Physical Exam General: NAD Pulmonary: Unlabored Extremities: no edema, no joint deformities Skin: no rashes Neurological: Weakness but otherwise nonfocal  IMPRESSION: Patient seen in infusion.    Patient denies significant changes.  She continues to endorse persistent and at times severe facial pain.  She says the pain over the past several days has been quite intense at times reaching 10 out of 10.  She has found that increasing use and frequency of Percocet has been effective at lowering the intensity of pain and making it tolerable.  Patient has been taking 2 Percocets every 4 hours.  Patient has also been taking acetaminophen in between doses of Percocet.  We discussed the possible excess use of acetaminophen in light of taking Tylenol plus Percocet.  We will plan to rotate her to oxycodone.  I note that she is followed by Dr. VMickeal Skinnerand was recently started on Trileptal.  Patient requests refills of gabapentin, Percocet, and oxycodone.  Patient endorses insomnia.  She has difficulty "turning my brain off."  She was previously tried on mirtazapine but did not find that effective.  Patient reports that she is taken trazodone with good effect in the past.  PLAN: -Continue current scope of treatment -Refill gabapentin 600 mg 3 times daily -Refill transdermal fentanyl 75 mcg every 72 hours #10 -Start oxycodone 10 to  20 mg every 4 hours as needed for breakthrough pain #90 -Start trazodone 50 mg nightly as needed for insomnia -Follow-up MyChart visit 1 month   Patient expressed understanding and was in agreement with this plan. She also understands that She can call the clinic at any time with any questions, concerns, or complaints.     Time Total: 15 minutes  Visit consisted of counseling and education dealing with the complex and emotionally intense issues of symptom  management and palliative care in the setting of serious and potentially life-threatening illness.Greater than 50%  of this time was spent counseling and coordinating care related to the above assessment and plan.  Signed by: Altha Harm, PhD, NP-C

## 2021-07-09 NOTE — Progress Notes (Signed)
Nutrition Follow-up:    Patient with head and neck cancer.  Completed treatment on 10/11/2000.  Patient with recurrence and was being treated at [facility].  Patient resuming care locally.  Patient on pembrolizumab and cetuximab.  PEG on hold per MD  Met with patient during infusion.  Patient reports that appetite has been decreased over the weekend due to increased pain.  Says appetite increased Monday and yesterday.  Says that she is drinking 3-4 ensure shakes per day.  Mostly eating puree foods that roommate prepares.  Says that she ate an egg this am and has already drank 2 ensure shakes.  Eating pudding during visit.      Medications: reviewed  Labs: Na 127  Anthropometrics:   Weight 118 lb 4.8 oz today  117 lb on 8/3 112 lb 14.4 oz on 7/13 112 lb on 6/22 108 lb on 6/13 111 lb on 4/7 and 3/15 105 lb on 2/2 111 lb on 1/24   NUTRITION DIAGNOSIS: Inadequate oral intake decreased with pain   INTERVENTION:  Recommend increasing ensure shakes to 4-5 if unable to tolerate puree foods or intake decreases and able to tolerate shakes with less pain. Encouraged patient to contact MD to adjust pain medication if not working.    MONITORING, EVALUATION, GOAL: weight trends, intake   NEXT VISIT: ~ 4 weeks  Courtez Twaddle B. Zenia Resides, [place], [place] Registered Dietitian [phone] (mobile)

## 2021-07-09 NOTE — Telephone Encounter (Signed)
Call from [place] for the patient wanting to know why the amt of her Oxycodone was reduced by 30 tablets. Patient requesting a return call to explain this [phone]

## 2021-07-09 NOTE — Patient Instructions (Signed)
[facility]  Discharge Instructions: Thank you for choosing [facility] to provide your oncology and hematology care.  If you have a lab appointment with the [facility], please go directly to the [facility] and check in at the registration area.  Wear comfortable clothing and clothing appropriate for easy access to any Portacath or PICC line.   We strive to give you quality time with your provider. You may need to reschedule your appointment if you arrive late (15 or more minutes).  Arriving late affects you and other patients whose appointments are after yours.  Also, if you miss three or more appointments without notifying the office, you may be dismissed from the clinic at the provider's discretion.      For prescription refill requests, have your pharmacy contact our office and allow 72 hours for refills to be completed.    Today you received the following chemotherapy and/or immunotherapy agents: Cetuximab      To help prevent nausea and vomiting after your treatment, we encourage you to take your nausea medication as directed.  BELOW ARE SYMPTOMS THAT SHOULD BE REPORTED IMMEDIATELY: *FEVER GREATER THAN 100.4 F (38 C) OR HIGHER *CHILLS OR SWEATING *NAUSEA AND VOMITING THAT IS NOT CONTROLLED WITH YOUR NAUSEA MEDICATION *UNUSUAL SHORTNESS OF BREATH *UNUSUAL BRUISING OR BLEEDING *URINARY PROBLEMS (pain or burning when urinating, or frequent urination) *BOWEL PROBLEMS (unusual diarrhea, constipation, pain near the anus) TENDERNESS IN MOUTH AND THROAT WITH OR WITHOUT PRESENCE OF ULCERS (sore throat, sores in mouth, or a toothache) UNUSUAL RASH, SWELLING OR PAIN  UNUSUAL VAGINAL DISCHARGE OR ITCHING   Items with * indicate a potential emergency and should be followed up as soon as possible or go to the Emergency Department if any problems should occur.  Please show the CHEMOTHERAPY ALERT CARD or IMMUNOTHERAPY ALERT CARD at check-in  to the Emergency Department and triage nurse.  Should you have questions after your visit or need to cancel or reschedule your appointment, please contact [facility]  [phone] and follow the prompts.  Office hours are 8:00 a.m. to 4:30 p.m. Monday - Friday. Please note that voicemails left after 4:00 p.m. may not be returned until the following business day.  We are closed weekends and major holidays. You have access to a nurse at all times for urgent questions. Please call the main number to the clinic [phone] and follow the prompts.  For any non-urgent questions, you may also contact your provider using MyChart. We now offer e-Visits for anyone 62 and older to request care online for non-urgent symptoms. For details visit [email or web address].   Also download the MyChart app! Go to the app store, search "MyChart", open the app, select [facility], and log in with your MyChart username and password.  Due to Covid, a mask is required upon entering the hospital/clinic. If you do not have a mask, one will be given to you upon arrival. For doctor visits, patients may have 1 support person aged 34 or older with them. For treatment visits, patients cannot have anyone with them due to current Covid guidelines and our immunocompromised population. Cetuximab injection What is this medication? CETUXIMAB (se TUX i mab) is a monoclonal antibody. It is used to treat colorectal cancer and head and neck cancer. This medicine may be used for other purposes; ask your health care provider or pharmacist if you have questions. COMMON BRAND NAME(S): Erbitux What should I tell my care team  before I take this medication? They need to know if you have any of these conditions: heart disease history of irregular heartbeat history of low levels of calcium, magnesium, or potassium in the blood history of tick bites lung or breathing disease, like asthma red meat allergy an  unusual or allergic reaction to cetuximab, other medicines, foods, dyes, or preservatives pregnant or trying to get pregnant breast-feeding How should I use this medication? This drug is given as an infusion into a vein. It is administered in a hospital or clinic by a specially trained health care professional. Talk to your pediatrician regarding the use of this medicine in children. Special care may be needed. Overdosage: If you think you have taken too much of this medicine contact a poison control center or emergency room at once. NOTE: This medicine is only for you. Do not share this medicine with others. What if I miss a dose? It is important not to miss your dose. Call your doctor or health care professional if you are unable to keep an appointment. What may interact with this medication? Interactions are not expected. This list may not describe all possible interactions. Give your health care provider a list of all the medicines, herbs, non-prescription drugs, or dietary supplements you use. Also tell them if you smoke, drink alcohol, or use illegal drugs. Some items may interact with your medicine. What should I watch for while using this medication? Visit your doctor or health care professional for regular checks on your progress. This drug may make you feel generally unwell. This is not uncommon, as chemotherapy can affect healthy cells as well as cancer cells. Report any side effects. Continue your course of treatment even though you feel ill unless your doctor tells you to stop. This medicine can make you more sensitive to the sun. Keep out of the sun while taking this medicine and for 2 months after the last dose. If you cannot avoid being in the sun, wear protective clothing and use sunscreen. Do not use sun lamps or tanning beds/booths. You may need blood work done while you are taking this medicine. In some cases, you may be given additional medicines to help with side effects.  Follow all directions for their use. Call your doctor or health care professional for advice if you get a fever, chills or sore throat, or other symptoms of a cold or flu. Do not treat yourself. This drug decreases your body's ability to fight infections. Try to avoid being around people who are sick. Avoid taking products that contain aspirin, acetaminophen, ibuprofen, naproxen, or ketoprofen unless instructed by your doctor. These medicines may hide a fever. Do not become pregnant while taking this medicine. Women should inform their doctor if they wish to become pregnant or think they might be pregnant. There is a potential for serious side effects to an unborn child. Use adequate birth control methods. Avoid pregnancy for at least 2 months after your last dose. Talk to your health care professional or pharmacist for more information. Do not breast-feed an infant while taking this medicine or during the 2 months after your last dose. What side effects may I notice from receiving this medication? Side effects that you should report to your doctor or health care professional as soon as possible: allergic reactions like skin rash, itching or hives, swelling of the face, lips, or tongue breathing problems changes in vision fast, irregular heartbeat feeling faint or lightheaded, falls fever, chills mouth sores redness, blistering, peeling or  loosening of the skin, including inside the mouth trouble passing urine or change in the amount of urine unusually weak or tired Side effects that usually do not require medical attention (report to your doctor or health care professional if they continue or are bothersome): changes in skin like acne, cracks, skin dryness constipation diarrhea headache nail changes nausea, vomiting stomach upset weight loss This list may not describe all possible side effects. Call your doctor for medical advice about side effects. You may report side effects to FDA at  1-800-FDA-1088. Where should I keep my medication? This drug is given in a hospital or clinic and will not be stored at home. NOTE: This sheet is a summary. It may not cover all possible information. If you have questions about this medicine, talk to your doctor, pharmacist, or health care provider.  2022 [employer] (2019-09-20 22:56:04)

## 2021-07-09 NOTE — Progress Notes (Signed)
Hematology/Oncology follow up note [facility] [phone] Fax:([phone]) [phone]   Patient Shannon Team: Pcp, No as PCP - General Shannon [facility] as PCP - Cardiology (Cardiology) Shannon [facility] as Consulting Physician (Oncology)  CHIEF COMPLAINTS/REASON FOR VISIT:  Follow-up for head and neck cancer  HISTORY OF PRESENTING ILLNESS:   Shannon Schroeder is a  65 y.o.  female with PMH listed below was seen in consultation at the request of ER Dr. Caryn Schroeder for evaluation of abnormal CT scan.  07/10/2020 she presented emergency room for evaluation of right-sided facial swelling and pain in the right side of her mouth for the past 6 weeks.  It started in her mouth with a small ulcer which progressively got worse.  She wears denture.  Not able to chew well.  She eats soft food. She was found to have right upper gum also with yellowish discharge noted. 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  Avoid piloting.,  Right orbital apex, also the right soft palate and palate and tonsil.  Furthermore there is evidence of intracranial extension along the right V2 at the foramen rotundum Small suspicious right level 1A lymph node measuring 7 mm.  Separate small indeterminate but suspicious enhancing soft tissue nodule of the left sublingual space.  Patient was referred to oncology for further evaluation management. Patient denies fever, chills, nausea vomiting diarrhea shortness of breath or cough.  Reports 10 out of 10 severe pain of right face and numbness.  She also has headache. Reports unintentional weight loss  She reports no contactable family members.  She is in the process of pointing her roommate Shannon Schroeder to be her power of attorney.  She also wants to record today's conversation for her roommate  Shannon Schroeder. Patient has 50-pack-year smoking history.  # seen by ENT Shannon Schroeder and had a biopsy. Biopsy was sent to Shannon Schroeder and the final pathology was positive for invasive squamous cell carcinoma, well-differentiated, keratinizing. 07/17/2020, brain MRI showed large right facial mass with right maxillary erosion involving the maxillary sinus, hard palate and pterygoid plates and muscles.  Perineural spreading along the multiple branches of the maxillary division of the right trigeminal nerve at the infraorbital canal, pterygopalatine fossa and foramen rotundum. 07/17/2020, PET scan showed Intense FDG uptake is associated with the large enhancing tumor which involves the right maxilla, right nasal cavity, right pterygoid palatine fossa, right orbital apex and right soft tissue palate and palatine tonsil. 2. Mild nonspecific FDG uptake is associated with the recently characterized suspicious right level 1A lymph node which measures 7 mm and has an SUV max of 2.12.  No Schroeder of distant metastasis.  Aortic atherosclerosis.  Patient also reports a history of hypertension, dysrhythmia, history of V. tach.  Patient previously was on lisinopril, atenolol, amlodipine for which she is no longer taking at this point due to lack of medication coverage.  She also does not have any primary Shannon provider. 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  On average she says she is takes 2 to 3 pills/day.  She also takes gabapentin 300 mg 3 times daily  # new onset of atrial fibrillation with rapid ventricular  response.  Patient was started on metoprolol.  Eliquis for anticoagulation.  # 09/06/2020- 09/11/2020 admitted due to A. fib, hypotension, multifocal pneumonia.  Patient was treated with IV antibiotics.  Cardiology saw the patient during admission and  added amiodarone.  Patient was discharged on 5 days course of oral antibiotics, continued on metoprolol and amiodarone for atrial fibrillation.  # Dysphagia, she declined PET tube placment.  #08/08/2020-10/11/2020 concurrent cisplatin and radiation. # 01/09/2021, PET scan showed improvement but persistent hypermetabolic activity associated with the right frontal sinus.Marked improvement in the posterior right nasopharynx with decrease in metabolic activity and tissue thickening.  Persistent but improved hypermetabolic thickened tissue in the anterior and medial right maxillary sinus.  Persistent hypermetabolic activity posterior at this level of pterygoid plate on the RIGHT. Potential new activity in the RIGHT skull base inferior medial along the sphenoid bone  # 01/14/2021, She had recurrent maxillary sinus squamous cell carcinoma and was referred to [facility] for Schroeder of feasibility of residual disease resection.  Patient stated at Shannon Schroeder for her oncology Shannon after that. Extensive outside medical records review was performed by me via Shannon [facility].   01/27/21 MRI brain with and without contrast were notable for recurrent disease in the right pterygopalatine fossa, along V2/V3 extending into the cavernous sinus and orbital fissure, as possibleperineural spread.  01/29/2021 patient establish Shannon with [facility] E[facility] Dr. Ihor Austin..  Due to the extent of disease, patient was not considered as a surgical candidate.  Shannon Schroeder sent patient to establish Shannon with Shannon Schroeder. Patient was recommended to proceed with neoadjuvant chemotherapy  Keis regimen followed by reevaluation of surgical resection.  Patient understands that treatment goal include symptom control and increase chance of disease control and her surgical outcome.  Patient understands that if she does not respond, same treatment will be continued but with palliative intent.   Keis regimen : Cetuximab 400 mg/m2 load then 250  mg/m2 weekly, PACLItaxel 135 mg/m2 weekly,CARBOplatin AUC 2 weekly for 6 weeks Alpha gal was negative   02/06/2021  C1D1  Cetux/Taxol/Carbo, able to finish chemo without complications [id number], C1 D8 Cetux/Taxol/Carbo able to finish chemo without complications [id number] Cetux/Taxol/Carbo, she developed hypotension, nausea and flushing.  Patient was evaluated by nurse practitioner.  Was given IV fluid bolus with improvement of BP and the patient was able to finish infusion successfully.   03/04/2021, C1D22, Cetux/Taxol/Carbo Per note, patient developed hypersensitivity to carboplatin. "tolerated Cetuximab and Paclitaxel infusion without sequelae however during her Carbo infusion, she became acutely altered, momentarily unresponsive, acutely desaturated to 70s, then 80s on RA, hypotensive 81/51 with respiratory wheezing appreciated. The infusion was stopped. NS IVF were given along with pepcid & solumedrol. Patient's vital Schroeder slowly responded but not to baseline. Pulse was thready and skin was cold and clammy. She followed some commands, slowly. A rapid response was called for the acute change in mental status and concerning vital Schroeder. Patient was taken to ER for further evaluation and work up"   03/25/2021 C1 D29 Cetux/Taxol/Cisplatin carboplatin was switched to cisplatin Unfortunately patient developed hypersensitivity to cisplatin as well. Patient missed 5/10, 5/17 treatments due to weakness.   04/10/2019 2 repeat MRI brain with and without contrast showed evidence of disease response, however continues to have CN V involvement with extension to the skull base, unlikely to be a surgical candidate.  Her case was noted to be discussed on the head and neck tumor board at [facility]. Patient was recommended to switch to regimen with pembrolizumab and  cetuximab given the intolerance of cis-platinum  INTERVAL HISTORY Shannon Schroeder is a 65 y.o. female who has above history reviewed by me today presents for  follow up visit for management of head and neck cancer,  Patient tolerates pured food, takes nutrition supplements.  Her weight is stable. Continues to have right facial pain.  On gabapentin,  fentanyl patch 50 MCG/h every 72 hours, patient takes 4-5 Percocet per day, as well Trileptal 300 mg twice daily.  Patient reports that she has a few days with severe pain.  Patient has reestablish Shannon with Dr. Baruch Gouty and there is plan for radiation..  Patient takes potassium 40 mEq twice daily    Review of Systems  Constitutional:  Positive for fatigue. Negative for appetite change, chills, fever and unexpected weight change.  HENT:   Negative for hearing loss, mouth sores and voice change.        Facial swelling, pain   Eyes:  Negative for eye problems.  Respiratory:  Negative for chest tightness, cough and shortness of breath.   Cardiovascular:  Negative for chest pain and palpitations.  Gastrointestinal:  Negative for abdominal distention, abdominal pain, blood in stool, constipation, nausea and vomiting.       Dysphagia  Endocrine: Negative for hot flashes.  Genitourinary:  Negative for difficulty urinating and frequency.   Musculoskeletal:  Negative for arthralgias.  Skin:  Negative for itching and rash.  Neurological:  Negative for extremity weakness and headaches.  Hematological:  Negative for adenopathy.  Psychiatric/Behavioral:  Negative for confusion.    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    Anxiety    Arthritis    Bipolar disorder (HCC)    COPD (chronic obstructive pulmonary disease) (HCC)    NO INHALERS   Depression    Dysrhythmia    H/O V TACH   Head and neck cancer ([place]) 07/29/2020   Headache    H/O MIGRAINES   Hypertension    Seizures ([place])    X1 AFTER FALL    SURGICAL HISTORY: Past Surgical History:  Procedure Laterality Date   ABDOMINAL HYSTERECTOMY      partial   BREAST SURGERY     biopsy   CARDIAC CATHETERIZATION     X 2   CARPAL TUNNEL RELEASE Right    CARPECTOMY HAND Right    CATARACT EXTRACTION W/PHACO Left 03/08/2018   Procedure: CATARACT EXTRACTION PHACO AND INTRAOCULAR LENS PLACEMENT ([place]);  Surgeon: Birder Robson, [facility];  Location: [facility] ORS;  Service: Ophthalmology;  Laterality: Left;  [place] 00:55 AP% 13.5 CDE 7.52 Fluid pack lot # [id number]   CATARACT EXTRACTION W/PHACO Right 04/05/2018   Procedure: CATARACT EXTRACTION PHACO AND INTRAOCULAR LENS PLACEMENT ([facility]);  Surgeon: Birder Robson, [facility];  Location: [facility] ORS;  Service: Ophthalmology;  Laterality: Right;  [place] 00:36 AP% 15.9 CDE 5.72 Fluid pack lot # [id number]   CEREBRAL ANEURYSM REPAIR     CHOLECYSTECTOMY     COLONOSCOPY WITH PROPOFOL N/A 04/26/2019   Procedure: COLONOSCOPY WITH PROPOFOL;  Surgeon: Jonathon Bellows, [facility];  [facility];  Service: Gastroenterology;  Laterality: N/A;   PORTACATH PLACEMENT Left 08/05/2020   Procedure: INSERTION PORT-A-CATH;  Surgeon: Herbert Pun, [facility];  Location: [facility] ORS;  Service: General;  Laterality: Left;   TUBAL LIGATION      SOCIAL HISTORY: Social History   Socioeconomic History   Marital status: Divorced    Spouse name:  Not on file   Number of children: Not on file   Years of education: 16   Highest education level: Bachelor's degree (e.g., BA, AB, BS)  Occupational History   Occupation: farmer  Tobacco Use   Smoking status: Every Day    Packs/day: 0.25    Years: 50.00    Pack years: 12.50    Types: Cigarettes   Smokeless tobacco: Never  Vaping Use   Vaping Use: Never used  Substance and Sexual Activity   Alcohol use: No   Drug use: No   Sexual activity: Not Currently  Other Topics Concern   Not on file  Social History Narrative   Pt lives at [facility].    Social Determinants of Health   Financial Resource Strain: Not on file  Food Insecurity: Not on file  Transportation Needs: Not on file   Physical Activity: Not on file  Stress: Not on file  Social Connections: Not on file  Intimate Partner Violence: Not on file    FAMILY HISTORY: Family History  Problem Relation Age of Onset   Hypertension Mother    Heart failure Mother    Hypertension Brother    Stroke Brother    Hypertension Son    Emphysema Maternal Aunt    Hypertension Paternal Aunt    Hypertension Paternal Uncle    Hypertension Maternal Grandmother    Hypertension Paternal Grandmother     ALLERGIES:  is allergic to naproxen, aspirin, belladonna alkaloids, fluoxetine, fluoxetine hcl, paroxetine hcl, phenobarbital, and prozac [fluoxetine hcl].  MEDICATIONS:  Current Outpatient Medications  Medication Sig Dispense Refill   albuterol (VENTOLIN HFA) 108 (90 Base) MCG/ACT inhaler Inhale 2 puffs into the lungs every 6 (six) hours as needed for wheezing or shortness of breath. 8 g 2   amiodarone (PACERONE) 200 MG tablet Take 1 tablet (200 mg total) by mouth daily. 90 tablet 1   apixaban (ELIQUIS) 5 MG TABS tablet Take 1 tablet (5 mg total) by mouth 2 (two) times daily. 180 tablet 3   chlorhexidine (PERIDEX) 0.12 % solution Use as directed 15 mLs in the mouth or throat 2 (two) times daily. 473 mL 6   docusate sodium (COLACE) 100 MG capsule Take 1 capsule (100 mg total) by mouth daily. 30 capsule 1   feeding supplement, ENSURE ENLIVE, (ENSURE ENLIVE) LIQD Take 237 mLs by mouth 3 (three) times daily between meals. 237 mL 12   fluticasone-salmeterol (ADVAIR HFA) 45-21 MCG/ACT inhaler Inhale 2 puffs into the lungs 2 (two) times daily. 1 each 3   magnesium chloride (SLOW-MAG) 64 MG TBEC SR tablet Take 1 tablet (64 mg total) by mouth 2 (two) times daily. 60 tablet 1   Melatonin 10 MG TABS Take 10 mg by mouth at bedtime as needed (sleep).     ondansetron (ZOFRAN-ODT) 8 MG disintegrating tablet Take 1 tablet (8 mg total) by mouth every 8 (eight) hours as needed for nausea or vomiting. 90 tablet 0   Oxcarbazepine (TRILEPTAL)  300 MG tablet Take 1 tablet (300 mg total) by mouth 2 (two) times daily. 60 tablet 1   oxyCODONE-acetaminophen (PERCOCET) 10-325 MG tablet Take 1 tablet by mouth every 6 (six) hours as needed for pain. 120 tablet 0   polyethylene glycol powder (GLYCOLAX/MIRALAX) 17 GM/SCOOP powder Take 17 g by mouth daily as needed for mild constipation or moderate constipation. 255 g 0   potassium chloride SA (KLOR-CON) 20 MEQ tablet Take 2 tablets (40 mEq total) by mouth daily. 60 tablet 1  fentaNYL (DURAGESIC) 75 MCG/HR Place 1 patch onto the skin every 3 (three) days. 10 patch 0   furosemide (LASIX) 20 MG tablet Take 1 tablet (20 mg total) by mouth daily as needed (for lower extremity swelling). (Patient not taking: No sig reported) 90 tablet 1   gabapentin (NEURONTIN) 300 MG capsule Take 2 capsules (600 mg total) by mouth 3 (three) times daily. 180 capsule 1   Multiple Vitamins-Minerals (CENTRUM SILVER PO) Take 1 tablet by mouth daily. Gummie (Patient not taking: No sig reported)     Oxycodone HCl 10 MG TABS Take 1-2 tablets (10-20 mg total) by mouth every 4 (four) hours as needed. 90 tablet 0   traZODone (DESYREL) 50 MG tablet Take 1 tablet (50 mg total) by mouth at bedtime as needed for sleep. 30 tablet 2   No current facility-administered medications for this visit.   Facility-Administered Medications Ordered in Other Visits  Medication Dose Route Frequency Provider Last Rate Last Admin   heparin lock flush 100 UNIT/ML injection            heparin lock flush 100 UNIT/ML injection            heparin lock flush 100 unit/mL  500 Units Intravenous Once Shannon Schroeder       heparin lock flush 100 unit/mL  500 Units Intracatheter Once PRN Shannon Schroeder       sodium chloride flush (NS) 0.9 % injection 10 mL  10 mL Intravenous PRN Shannon Schroeder   10 mL at 10/10/20 1040   sodium chloride flush (NS) 0.9 % injection 10 mL  10 mL Intravenous PRN Shannon Schroeder   10 mL at 11/13/20 0836   sodium chloride flush (NS) 0.9 %  injection 10 mL  10 mL Intravenous PRN Shannon Schroeder   10 mL at 12/30/20 0838   sodium chloride flush (NS) 0.9 % injection 10 mL  10 mL Intravenous PRN Shannon Schroeder   10 mL at 01/14/21 0921   sodium chloride flush (NS) 0.9 % injection 10 mL  10 mL Intravenous PRN Shannon Schroeder   10 mL at 06/11/21 0815   sodium chloride flush (NS) 0.9 % injection 10 mL  10 mL Intravenous PRN Shannon Schroeder   10 mL at 07/09/21 0814     PHYSICAL EXAMINATION: ECOG PERFORMANCE STATUS: 1 - Symptomatic but completely ambulatory Vitals:   07/09/21 0843  BP: (!) 147/84  Pulse: 97  Resp: 18  Temp: (!) 97.1 F (36.2 C)   Filed Weights   07/09/21 0843  Weight: 118 lb 4.8 oz (53.7 kg)    Physical Exam Constitutional:      General: She is not in acute distress.    Comments: Thin built.  Patient sits in the wheelchair  HENT:     Head: Normocephalic and atraumatic.     Mouth/Throat:     Comments: She has right palate deficiency Right facial swelling and tenderness. Eyes:     General: No scleral icterus.    Comments: Right eyelid ptosis  Cardiovascular:     Rate and Rhythm: Normal rate and regular rhythm.     Heart sounds: Normal heart sounds.  Pulmonary:     Effort: Pulmonary effort is normal. No respiratory distress.     Breath sounds: No wheezing.  Abdominal:     General: Bowel sounds are normal. There is no distension.     Palpations: Abdomen is soft.  Musculoskeletal:  General: No deformity. Normal range of motion.     Cervical back: Normal range of motion and neck supple.  Skin:    General: Skin is warm and dry.     Findings: No erythema or rash.  Neurological:     Mental Status: She is alert and oriented to person, place, and time. Mental status is at baseline.     Cranial Nerves: No cranial nerve deficit.     Coordination: Coordination normal.  Psychiatric:        Mood and Affect: Mood normal.    LABORATORY DATA:  I have reviewed the data as listed Lab Results  Component Value  Date   WBC 8.3 07/09/2021   HGB 11.7 (L) 07/09/2021   HCT 36.3 07/09/2021   MCV 92.1 07/09/2021   PLT 483 (H) 07/09/2021   Recent Labs    07/10/20 1311 07/12/20 1204 08/08/20 0821 04/14/21 1317 04/23/21 0810 06/25/21 0822 07/02/21 0925 07/09/21 0814  NA 136 137   < > 129*   < > 129* 132* 127*  K 2.9* 4.0   < > 4.5   < > 3.1* 3.0* 4.0  CL 97* 98   < > 92*   < > 92* 95* 91*  CO2 28 27   < > 29   < > '29 26 28  '$ GLUCOSE 100* 114*   < > 106*   < > 108* 124* 98  BUN 13 8   < > 19   < > 12 7* 13  CREATININE 0.43* 0.42*   < > 0.46   < > 0.39* 0.48 <0.30*  CALCIUM 8.9 9.0   < > 9.0   < > 8.4* 8.7* 8.9  GFRNONAA >60 >60   < > >60   < > >60 >60 NOT CALCULATED  GFRAA >60 >60  --   --   --   --   --   --   PROT 7.0  --    < > 7.1   < > 7.1 7.5 7.6  ALBUMIN 3.7  --    < > 3.1*   < > 3.2* 3.3* 3.3*  AST 20  --    < > 15   < > '16 17 18  '$ ALT 16  --    < > 10   < > '11 10 12  '$ ALKPHOS 106  --    < > 115   < > 107 107 108  BILITOT 0.5  --    < > <0.1*   < > 0.3 0.4 <0.1*  BILIDIR  --   --   --  <0.1  --   --   --   --   IBILI  --   --   --  NOT CALCULATED  --   --   --   --    < > = values in this interval not displayed.    Iron/TIBC/Ferritin/ %Sat    Component Value Date/Time   IRON 46 09/09/2020 0938   IRON 37 05/03/2018 1826   TIBC 210 (L) 09/09/2020 0938   FERRITIN 136 09/09/2020 0938   FERRITIN 52 05/03/2018 1826   IRONPCTSAT 22 09/09/2020 0938      RADIOGRAPHIC STUDIES: I have personally reviewed the radiological images as listed and agreed with the findings in the report. MR Brain W Wo Contrast  Result Date: 06/27/2021 CLINICAL DATA:  Maxillary sinus cancer. EXAM: MRI HEAD WITHOUT AND WITH CONTRAST TECHNIQUE: Multiplanar, multiecho pulse sequences of the  brain and surrounding structures were obtained without and with intravenous contrast. CONTRAST:  26m GADAVIST GADOBUTROL 1 MMOL/ML IV SOLN COMPARISON:  04/09/2021 and earlier FINDINGS: Brain: No evidence of parenchymal or  cisternal involvement. No infarct, hemorrhage, hydrocephalus, or collection. 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 Skull and upper cervical spine: Unremarkable prior craniotomy Sinuses/Orbits: Right maxillary sinus cancer with extensive extra spinous spread and surgical treatment. Although a different protocol was used on prior, there is decreased bulk of dominant residual tumor in the right masticator space which defies reproducible measurement given the irregular shape and infiltrative appearance. Fairly thin rind of enhancing tissue around the remaining right maxillary sinus is non progressed. Perineural tumor with dural infiltration/thickening overlying the right Meckel's cave and medial middle cranial fossa is unchanged. Tumor over rides the right mandibular foramen without visible tumor infiltration at the level of the edentulous mandible. The orbits appear clear. IMPRESSION: 1. Positive treatment response with decreasing tumor bulk in the right masticator space. Known perineural tumor spread with dural thickening at the right middle cranial fossa that is nonprogressive. 2. No evidence of cisternal or parenchymal brain involvement. 3. At future follow-up imaging, recommend trigeminal nerve protocol or facial protocol rather than standard brain MRI. Electronically Signed   By: JMonte FantasiaM.D.   On: 06/27/2021 18:06   NM PET Image Restag (PS) Skull Base To Thigh  Result Date: 07/09/2021 CLINICAL DATA:  Subsequent treatment strategy for head/neck cancer. EXAM: NUCLEAR MEDICINE PET SKULL BASE TO THIGH TECHNIQUE: 6.1 mCi F-18 FDG was injected intravenously. Full-ring PET imaging was performed from the skull base to thigh after the radiotracer. CT data was obtained and used for attenuation correction and anatomic localization. Fasting blood glucose: 91 mg/dl COMPARISON:  CT abdomen 05/01/2021 and PET 01/09/2021. CT chest 09/26/2020. FINDINGS: Mediastinal blood pool activity: SUV max  1.7 Liver activity: SUV max NA NECK: Cavitary mass in the right maxillary sinus extends to the right nasopharynx, appears less thick walled than on 01/09/2021 and has minimal residual peripheral hypermetabolism, SUV max 4.7 laterally. No hypermetabolic lymph nodes. Incidental CT findings: Associated osseous destruction surrounding the right maxillary sinus. CHEST: No hypermetabolic mediastinal, hilar or axillary lymph nodes. No hypermetabolic pulmonary nodules. Incidental CT findings: Left IJ Port-A-Cath terminates in the SVC. Heart is at the upper limits of normal in size. No pericardial or pleural effusion. Basilar predominant peribronchovascular ground-glass and mild septal thickening. ABDOMEN/PELVIS: No abnormal hypermetabolism in the liver, adrenal glands, spleen or pancreas. No hypermetabolic lymph nodes. Incidental CT findings: Liver is unremarkable. Cholecystectomy. Adrenal glands, kidneys, spleen, pancreas, stomach and bowel are grossly unremarkable. Atherosclerotic calcification of the aorta. SKELETON: No abnormal osseous hypermetabolism. Very mild hypermetabolism associated with a fracture of the superior endplate of L1 is new from 01/09/2021. Incidental CT findings: Degenerative changes in the spine. IMPRESSION: 1. Continued response to therapy as evidenced by decreased wall thickening and hypermetabolism associated with a cavitary right maxillary/nasopharyngeal mass. No evidence of distant metastatic disease. 2. Subacute compression of the L1 superior endplate, new from 01/09/2021. 3.  Aortic atherosclerosis (ICD10-I70.0). Electronically Signed   By: MLorin PicketM.D.   On: 07/09/2021 13:28       ASSESSMENT & PLAN:  1. 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[place]   2. History of atrial fibrillation   3. Hypokalemia   4. Hypomagnesemia   5. Neoplasm related pain   6. Trigeminal neuropathy   7. Moderate protein-calorie malnutrition ([place]   8. Encounter for antineoplastic immunotherapy    #maxillary  sinus  squamous cell carcinoma, s/p concurrent chemotherapy and radiation-recurrence-Unresectable  Labs are reviewed and discussed with patient. Proceed with cetuximab today 8/262022 MRI brain showed positive treatment response with decreasing tumor bulk in the right masticator space. Known perineural tumor spread with dural thickening at the right middle cranial fossa that is nonprogressive.  No evidence of cisternal or parenchymal brain involvement.    #Neoplasm related pain Continue fentanyl patch to 75 MCG/h every 72 hours, continue Percocet 10/325 every 4-6 hours as needed.   Continue gabapentin 600 mg 3 times daily.  Continue Trileptal 300 mg twice daily recommended by Dr. Mickeal Skinner.  Patient recently was seen by Dr. Baruch Gouty and there is plan for repeating radiation.  PET scan results are available after patient's appointment.  Continued response to therapy. If patient is to start radiation, I will hold off Keytruda during radiation and resume after that.   #Malnutrition, dysphagia She appears having better oral intake and has gained weight.  We will hold off tube placement at this point.  Continue nutrition supplementation.  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 Na level is stable132 Liberate dietary salt intake.  Proceed with 1 L of IV fluid plus IV potassium chloride 20 mEq x 1 today.  #Hypokalemia, K is 3.0 continue potassium chloride 101mq BID.  IV KCL 243m x 1 today # Hypomagnesia, magnesium 1.8.  Continue Slow-Mag 1 tablet daily.    #Atrial fibrillation on amiodarone and metoprolol.Eliquis  All questions were answered. The patient knows to call the clinic with any problems questions or concerns.   Return of visit:   1 week for lab Schroeder for Keytruda cetuximab  ZhEarlie ServerMD, PhD Hematology Oncology [facility]- [id number]/05/2021

## 2021-07-09 NOTE — Progress Notes (Signed)
Patient here for follow up. Pt reports that over the past 5 days pain to right side of face has increased. "Pain is so bad that it decreases appetite." Pt also  needing refill on gabapentin and percocet

## 2021-07-09 NOTE — Telephone Encounter (Signed)
Called and spoke with patient. All questions answered.

## 2021-07-11 ENCOUNTER — Other Ambulatory Visit: Payer: Self-pay

## 2021-07-11 ENCOUNTER — Inpatient Hospital Stay (HOSPITAL_BASED_OUTPATIENT_CLINIC_OR_DEPARTMENT_OTHER): Payer: Medicare Other | Admitting: Internal Medicine

## 2021-07-11 DIAGNOSIS — G509 Disorder of trigeminal nerve, unspecified: Secondary | ICD-10-CM | POA: Diagnosis not present

## 2021-07-11 MED ORDER — OXCARBAZEPINE 600 MG PO TABS: 600.0000 mg | ORAL_TABLET | Freq: Two times a day (BID) | ORAL | 3 refills | Status: DC

## 2021-07-11 NOTE — Progress Notes (Signed)
I connected with Shannon Schroeder on 07/11/21 at 12:00 PM EDT by telephone visit and verified that I am speaking with the correct person using two identifiers.  I discussed the limitations, risks, security and privacy concerns of performing an evaluation and management service by telemedicine and the availability of in-person appointments. I also discussed with the patient that there may be a patient responsible charge related to this service. The patient expressed understanding and agreed to proceed.  Other persons participating in the visit and their role in the encounter:  n/a  Patient's location:  Home  Provider's location:  Office  Chief Complaint:  Trigeminal neuropathy  History of Present Ilness: Shannon Schroeder describes improvement in facial pain symptoms after starting the trileptal from prior visit.  Also helpful has been opioid management through Dr. Regenia Skeeter.  She still does describe the symptoms, but fortunately not as severely intrusive.  She would be interested in continuing to increase the medicine to provide additional relief.   Observations: Language and cognition at baseline, noted LMN dysarthria Assessment and Plan: Trigeminal neuropathy  We discussed increasing the Trileptal to '600mg'$  BID.  We do acknowledge her hyponatremia may be a limiting factor long term, but the drug is providing significant relief right now.  Ultimately we are hopeful that salvage radiation to the nerve will provide some measure of control and treatment of underlying etiology.  She continues to obtain frequent labs, we can hopefully draw down the dose if hyponatremia is persistent or worsens.    Follow Up Instructions: Will con't to follow  I discussed the assessment and treatment plan with the patient.  The patient was provided an opportunity to ask questions and all were answered.  The patient agreed with the plan and demonstrated understanding of the instructions.    The patient was advised to  call back or seek an in-person evaluation if the symptoms worsen or if the condition fails to improve as anticipated.  I provided 5-10 minutes of non-face-to-face time during this enocunter.  Ventura Sellers, MD   I provided 15 minutes of non face-to-face telephone visit time during this encounter, and > 50% was spent counseling as documented under my assessment & plan.

## 2021-07-14 ENCOUNTER — Ambulatory Visit: Payer: Medicare Other

## 2021-07-14 ENCOUNTER — Encounter: Payer: Self-pay | Admitting: Oncology

## 2021-07-14 NOTE — Progress Notes (Signed)
Cardiology Office Note  Date:  07/15/2021   ID:  Shannon Schroeder, DOB 1956/10/31, MRN [id number]  PCP:  Pcp, No   Chief Complaint  Patient presents with   6 month follow up     Patient c/o irregular heartbeats at times. Medications reviewed by the patient verbally.     HPI:  Shannon Schroeder is a 65 y.o. 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  In follow-up today, reports that she continues to undergo treatment of her maxillary sinus cancer.  Reports that she might need radiation  Overall in good spirits, weight stable if not trending upwards over the past year Our records show weight up 7 pounds from January 2022 Having rare episodes of tachycardia lasting less than 60 seconds Unclear if it is SVT or atrial fibrillation Tolerating amiodarone 200 mg daily Tolerating Eliquis 5 twice daily, no falls or bleeding  EKG personally reviewed by myself on todays visit Shows normal sinus rhythm rate 84 bpm nonspecific ST abnormality  Recent records reviewed on today's visit She was seen virtually by Dr. Fletcher Anon in 03/2019 for preoperative cardiac evaluation for colonoscopy.   Echo at that time showed an EF of 60-65%, normal LV diastolic function, normal RVSF with normal ventricular cavity size, PASP 32 mmHg, and mildly to moderately dilated left atrium.    Available stress test from [facility] in 05/2009 showed a small, mildly severe reversible defect involving the mid anteroseptal wall consistent with probable attenuation artifact and/or possible  ischemia. EF > 65%.    She was diagnosed with maxillary sinus cancer in 07/2020 and has been undergoing chemoradiation therapy.    hospital in 08/2020,   new onset Afib with RVR of uncertain chronicity.   converted to sinus rhythm on diltiazem gtt   placed on metoprolol and Eliquis.   Echo during the admission showed an EF of 60-65%, no RWMA, mild LVH, Gr2DD, normal PASP, mild MR.  CTA chest was negative for PE.  No significant coronary artery calcium was noted on CTA of the chest.     hospital on 09/06/20  weakness and hypotension.  Afib with RVR upon arrival to the ED. multifocal PNA and treated with broad spectrum antibiotics. Cardizem gtt with improvement in her ventricular rates throughout her admission.  11/8, she developed SVT with rates into the 170s bpm with noted drop in BP to the [id number] systolic.  bolus of IV amiodarone and placed on an amiodarone gtt.   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  PSH:    Past Surgical History:  Procedure Laterality Date   ABDOMINAL HYSTERECTOMY     partial   BREAST SURGERY     biopsy   CARDIAC CATHETERIZATION     X 2   CARPAL TUNNEL RELEASE Right  CARPECTOMY HAND Right    CATARACT EXTRACTION W/PHACO Left 03/08/2018   Procedure: CATARACT EXTRACTION PHACO AND INTRAOCULAR LENS PLACEMENT (IOC);  Surgeon: Birder Robson, MD;  Location: [facility] ORS;  Service: Ophthalmology;  Laterality: Left;  [place] 00:55 AP% 13.5 CDE 7.52 Fluid pack lot # [id number]   CATARACT EXTRACTION W/PHACO Right 04/05/2018   Procedure: CATARACT EXTRACTION PHACO AND INTRAOCULAR LENS PLACEMENT (IOC);  Surgeon: Birder Robson, MD;  Location: [facility] ORS;  Service:  Ophthalmology;  Laterality: Right;  [place] 00:36 AP% 15.9 CDE 5.72 Fluid pack lot # [id number]   CEREBRAL ANEURYSM REPAIR     CHOLECYSTECTOMY     COLONOSCOPY WITH PROPOFOL N/A 04/26/2019   Procedure: COLONOSCOPY WITH PROPOFOL;  Surgeon: Jonathon Bellows, MD;  Location: [facility] ENDOSCOPY;  Service: Gastroenterology;  Laterality: N/A;   PORTACATH PLACEMENT Left 08/05/2020   Procedure: INSERTION PORT-A-CATH;  Surgeon: Herbert Pun, MD;  Location: [facility] ORS;  Service: General;  Laterality: Left;   TUBAL LIGATION      Current Outpatient Medications  Medication Sig Dispense Refill   albuterol (VENTOLIN HFA) 108 (90 Base) MCG/ACT inhaler Inhale 2 puffs into the lungs every 6 (six) hours as needed for wheezing or shortness of breath. 8 g 2   apixaban (ELIQUIS) 5 MG TABS tablet Take 1 tablet (5 mg total) by mouth 2 (two) times daily. 180 tablet 3   chlorhexidine (PERIDEX) 0.12 % solution Use as directed 15 mLs in the mouth or throat 2 (two) times daily. 473 mL 6   docusate sodium (COLACE) 100 MG capsule Take 1 capsule (100 mg total) by mouth daily. 30 capsule 1   feeding supplement, ENSURE ENLIVE, (ENSURE ENLIVE) LIQD Take 237 mLs by mouth 3 (three) times daily between meals. 237 mL 12   fentaNYL (DURAGESIC) 75 MCG/HR Place 1 patch onto the skin every 3 (three) days. 10 patch 0   fluticasone-salmeterol (ADVAIR HFA) 45-21 MCG/ACT inhaler Inhale 2 puffs into the lungs 2 (two) times daily. 1 each 3   gabapentin (NEURONTIN) 300 MG capsule Take 2 capsules (600 mg total) by mouth 3 (three) times daily. 180 capsule 1   magnesium chloride (SLOW-MAG) 64 MG TBEC SR tablet Take 1 tablet (64 mg total) by mouth 2 (two) times daily. 60 tablet 1   Melatonin 10 MG TABS Take 10 mg by mouth at bedtime as needed (sleep).     Multiple Vitamins-Minerals (CENTRUM SILVER PO) Take 1 tablet by mouth daily. Gummie     ondansetron (ZOFRAN-ODT) 8 MG disintegrating tablet Take 1 tablet (8 mg total) by mouth every 8 (eight) hours as  needed for nausea or vomiting. 90 tablet 0   oxcarbazepine (TRILEPTAL) 600 MG tablet Take 1 tablet (600 mg total) by mouth 2 (two) times daily. 60 tablet 3   Oxycodone HCl 10 MG TABS Take 1-2 tablets (10-20 mg total) by mouth every 4 (four) hours as needed. 120 tablet 0   oxyCODONE-acetaminophen (PERCOCET) 10-325 MG tablet Take 1 tablet by mouth every 6 (six) hours as needed for pain. 120 tablet 0   polyethylene glycol powder (GLYCOLAX/MIRALAX) 17 GM/SCOOP powder Take 17 g by mouth daily as needed for mild constipation or moderate constipation. 255 g 0   potassium chloride SA (KLOR-CON) 20 MEQ tablet Take 2 tablets (40 mEq total) by mouth daily. 60 tablet 1   traZODone (DESYREL) 50 MG tablet Take 1-2 tablets (50-100 mg total) by mouth at bedtime as needed for sleep. 60 tablet 2   amiodarone (PACERONE) 200 MG tablet Take 1 tablet (  200 mg total) by mouth daily. 90 tablet 3   furosemide (LASIX) 20 MG tablet Take 1 tablet (20 mg total) by mouth daily as needed (for lower extremity swelling). (Patient not taking: No sig reported) 90 tablet 1   No current facility-administered medications for this visit.   Facility-Administered Medications Ordered in Other Visits  Medication Dose Route Frequency Provider Last Rate Last Admin   heparin lock flush 100 UNIT/ML injection            heparin lock flush 100 UNIT/ML injection            heparin lock flush 100 unit/mL  500 Units Intravenous Once Earlie Server, MD       heparin lock flush 100 unit/mL  500 Units Intracatheter Once PRN Earlie Server, MD       sodium chloride flush (NS) 0.9 % injection 10 mL  10 mL Intravenous PRN Earlie Server, MD   10 mL at 10/10/20 1040   sodium chloride flush (NS) 0.9 % injection 10 mL  10 mL Intravenous PRN Earlie Server, MD   10 mL at 11/13/20 0836   sodium chloride flush (NS) 0.9 % injection 10 mL  10 mL Intravenous PRN Earlie Server, MD   10 mL at 12/30/20 [id number]   sodium chloride flush (NS) 0.9 % injection 10 mL  10 mL Intravenous PRN Earlie Server, MD    10 mL at 01/14/21 [id number]   sodium chloride flush (NS) 0.9 % injection 10 mL  10 mL Intravenous PRN Earlie Server, MD   10 mL at 06/11/21 0815   sodium chloride flush (NS) 0.9 % injection 10 mL  10 mL Intravenous PRN Earlie Server, MD   10 mL at 07/09/21 [id number]     Allergies:   Naproxen, Aspirin, Belladonna alkaloids, Fluoxetine, Fluoxetine hcl, Paroxetine hcl, Phenobarbital, and Prozac [fluoxetine hcl]   Social History:  The patient  reports that she has been smoking cigarettes. She has a 12.50 pack-year smoking history. She has never used smokeless tobacco. She reports that she does not drink alcohol and does not use drugs.   Family History:   family history includes Emphysema in her maternal aunt; Heart failure in her mother; Hypertension in her brother, maternal grandmother, mother, paternal aunt, paternal grandmother, paternal uncle, and son; Stroke in her brother.    Review of Systems: Review of Systems  Constitutional: Negative.   HENT: Negative.    Respiratory: Negative.    Cardiovascular: Negative.   Gastrointestinal: Negative.   Musculoskeletal: Negative.   Neurological: Negative.   Psychiatric/Behavioral: Negative.    All other systems reviewed and are negative.  PHYSICAL EXAM: VS:  BP 110/60 (BP Location: Left Arm, Patient Position: Sitting, Cuff Size: Normal)   Pulse 84   Ht '5\' 6"'$  (1.676 m)   Wt 118 lb (53.5 kg)   LMP 11/19/1988 (Exact Date)   SpO2 98%   BMI 19.05 kg/m  , BMI Body mass index is 19.05 kg/m. GEN: Well nourished, well developed, in no acute distress HEENT: Right facial swelling Neck: no JVD, carotid bruits, or masses Cardiac: RRR; no murmurs, rubs, or gallops,no edema  Respiratory:  clear to auscultation bilaterally, normal work of breathing GI: soft, nontender, nondistended, + BS MS: no deformity or atrophy Skin: warm and dry, no rash Neuro:  Strength and sensation are intact Psych: euthymic mood, full affect   Recent Labs: 08/08/2020: B Natriuretic  Peptide 233.2 05/14/2021: TSH 9.516 07/09/2021: ALT 12; BUN 13; Creatinine, Ser <0.30; Hemoglobin 11.7; Magnesium  2.1; Platelets 483; Potassium 4.0; Sodium 127    Lipid Panel Lab Results  Component Value Date   CHOL 129 08/09/2020   HDL 52 08/09/2020   LDLCALC 60 08/09/2020   TRIG 87 08/09/2020      Wt Readings from Last 3 Encounters:  07/15/21 118 lb (53.5 kg)  07/09/21 118 lb 4.8 oz (53.7 kg)  07/02/21 114 lb (51.7 kg)      ASSESSMENT AND PLAN:  Problem List Items Addressed This Visit   None Visit Diagnoses     PAF (paroxysmal atrial fibrillation) (HCC)    -  Primary   Relevant Medications   amiodarone (PACERONE) 200 MG tablet   Other Relevant Orders   EKG 12-Lead   Paroxysmal SVT (supraventricular tachycardia) (HCC)       Relevant Medications   amiodarone (PACERONE) 200 MG tablet   Other Relevant Orders   EKG 12-Lead   Demand ischemia (HCC)       Relevant Medications   amiodarone (PACERONE) 200 MG tablet   Essential hypertension       Relevant Medications   amiodarone (PACERONE) 200 MG tablet   Sinus bradycardia       Relevant Medications   amiodarone (PACERONE) 200 MG tablet   History of ventricular tachycardia         Paroxysmal atrial fibrillation Maintaining normal sinus rhythm, continue amiodarone 200 daily Blood pressure low restricting use of beta-blocker  Paroxysmal SVT Minimal breakthrough episodes lasting less than 60 seconds 2 or 3 times a week On amiodarone 200 daily  Severe protein calorie malnutrition Weight stable, trending up 7 pounds in the past year    Total encounter time more than 25 minutes  Greater than 50% was spent in counseling and coordination of care with the patient    Signed, Esmond Plants, M.D., Ph.D. [place], [place]

## 2021-07-15 ENCOUNTER — Encounter: Payer: Self-pay | Admitting: Cardiovascular Disease

## 2021-07-15 ENCOUNTER — Ambulatory Visit (INDEPENDENT_AMBULATORY_CARE_PROVIDER_SITE_OTHER): Payer: Medicare Other | Admitting: Cardiovascular Disease

## 2021-07-15 ENCOUNTER — Encounter: Payer: Self-pay | Admitting: Hospice and Palliative Medicine

## 2021-07-15 ENCOUNTER — Other Ambulatory Visit: Payer: Self-pay | Admitting: Hospice and Palliative Medicine

## 2021-07-15 ENCOUNTER — Other Ambulatory Visit: Payer: Self-pay

## 2021-07-15 VITALS — BP 110/60 | HR 84 | Ht 66.0 in | Wt 118.0 lb

## 2021-07-15 DIAGNOSIS — Z8679 Personal history of other diseases of the circulatory system: Secondary | ICD-10-CM

## 2021-07-15 DIAGNOSIS — I480 Paroxysmal atrial fibrillation: Secondary | ICD-10-CM

## 2021-07-15 DIAGNOSIS — I248 Other forms of acute ischemic heart disease: Secondary | ICD-10-CM

## 2021-07-15 DIAGNOSIS — I471 Supraventricular tachycardia: Secondary | ICD-10-CM

## 2021-07-15 DIAGNOSIS — I10 Essential (primary) hypertension: Secondary | ICD-10-CM

## 2021-07-15 DIAGNOSIS — R001 Bradycardia, unspecified: Secondary | ICD-10-CM

## 2021-07-15 MED ORDER — TRAZODONE HCL 50 MG PO TABS: 50.0000 mg | ORAL_TABLET | Freq: Every evening | ORAL | 2 refills | Status: AC | PRN

## 2021-07-15 MED ORDER — OXYCODONE HCL 10 MG PO TABS: 10.0000 mg | ORAL_TABLET | ORAL | 0 refills | Status: DC | PRN

## 2021-07-15 MED ORDER — AMIODARONE HCL 200 MG PO TABS: 200.0000 mg | ORAL_TABLET | Freq: Every day | ORAL | 3 refills | Status: DC

## 2021-07-15 NOTE — Patient Instructions (Addendum)
Medication Instructions:  No changes  If you need a refill on your cardiac medications before your next appointment, please call your pharmacy.   Lab work: No new labs needed  Testing/Procedures: No new testing needed  Follow-Up: At [facility], you and your health needs are our priority.  As part of our continuing mission to provide you with exceptional heart care, we have created designated Provider Care Teams.  These Care Teams include your primary Cardiologist (physician) and Advanced Practice Providers (APPs -  Physician Assistants and Nurse Practitioners) who all work together to provide you with the care you need, when you need it.  You will need a follow up appointment in 12 months  Providers on your designated Care Team:   Christopher Berge, NP Ryan Dunn, PA-C Jacquelyn Visser, PA-C Cadence Furth, PA-C  COVID-19 Vaccine Information can be found at: [email or web address] For questions related to vaccine distribution or appointments, please email [email or web address] or call [phone].    

## 2021-07-16 ENCOUNTER — Inpatient Hospital Stay: Payer: Medicare Other

## 2021-07-16 ENCOUNTER — Encounter: Payer: Self-pay | Admitting: Oncology

## 2021-07-16 ENCOUNTER — Inpatient Hospital Stay (HOSPITAL_BASED_OUTPATIENT_CLINIC_OR_DEPARTMENT_OTHER): Payer: Medicare Other | Admitting: Oncology

## 2021-07-16 VITALS — BP 121/61 | HR 76 | Temp 97.1°F | Resp 16 | Wt 120.9 lb

## 2021-07-16 VITALS — BP 138/76 | HR 74 | Resp 16

## 2021-07-16 DIAGNOSIS — G509 Disorder of trigeminal nerve, unspecified: Secondary | ICD-10-CM | POA: Diagnosis not present

## 2021-07-16 DIAGNOSIS — C310 Malignant neoplasm of maxillary sinus: Secondary | ICD-10-CM

## 2021-07-16 DIAGNOSIS — Z8679 Personal history of other diseases of the circulatory system: Secondary | ICD-10-CM

## 2021-07-16 DIAGNOSIS — C760 Malignant neoplasm of head, face and neck: Secondary | ICD-10-CM

## 2021-07-16 DIAGNOSIS — E440 Moderate protein-calorie malnutrition: Secondary | ICD-10-CM

## 2021-07-16 DIAGNOSIS — Z5112 Encounter for antineoplastic immunotherapy: Secondary | ICD-10-CM | POA: Diagnosis not present

## 2021-07-16 DIAGNOSIS — E871 Hypo-osmolality and hyponatremia: Secondary | ICD-10-CM

## 2021-07-16 DIAGNOSIS — G893 Neoplasm related pain (acute) (chronic): Secondary | ICD-10-CM

## 2021-07-16 DIAGNOSIS — E876 Hypokalemia: Secondary | ICD-10-CM

## 2021-07-16 LAB — CBC WITH DIFFERENTIAL/PLATELET
Abs Immature Granulocytes: 0.03 10*3/uL (ref 0.00–0.07)
Basophils Absolute: 0.1 10*3/uL (ref 0.0–0.1)
Basophils Relative: 1 %
Eosinophils Absolute: 0.3 10*3/uL (ref 0.0–0.5)
Eosinophils Relative: 3 %
HCT: 32.4 % — ABNORMAL LOW (ref 36.0–46.0)
Hemoglobin: 10.5 g/dL — ABNORMAL LOW (ref 12.0–15.0)
Immature Granulocytes: 0 %
Lymphocytes Relative: 6 %
Lymphs Abs: 0.6 10*3/uL — ABNORMAL LOW (ref 0.7–4.0)
MCH: 29.9 pg (ref 26.0–34.0)
MCHC: 32.4 g/dL (ref 30.0–36.0)
MCV: 92.3 fL (ref 80.0–100.0)
Monocytes Absolute: 1.3 10*3/uL — ABNORMAL HIGH (ref 0.1–1.0)
Monocytes Relative: 13 %
Neutro Abs: 7.5 10*3/uL (ref 1.7–7.7)
Neutrophils Relative %: 77 %
Platelets: 468 10*3/uL — ABNORMAL HIGH (ref 150–400)
RBC: 3.51 MIL/uL — ABNORMAL LOW (ref 3.87–5.11)
RDW: 16.2 % — ABNORMAL HIGH (ref 11.5–15.5)
WBC: 9.8 10*3/uL (ref 4.0–10.5)
nRBC: 0 % (ref 0.0–0.2)

## 2021-07-16 LAB — COMPREHENSIVE METABOLIC PANEL
ALT: 12 U/L (ref 0–44)
AST: 16 U/L (ref 15–41)
Albumin: 3.3 g/dL — ABNORMAL LOW (ref 3.5–5.0)
Alkaline Phosphatase: 99 U/L (ref 38–126)
Anion gap: 7 (ref 5–15)
BUN: 14 mg/dL (ref 8–23)
CO2: 27 mmol/L (ref 22–32)
Calcium: 8.5 mg/dL — ABNORMAL LOW (ref 8.9–10.3)
Chloride: 97 mmol/L — ABNORMAL LOW (ref 98–111)
Creatinine, Ser: 0.42 mg/dL — ABNORMAL LOW (ref 0.44–1.00)
GFR, Estimated: >60 mL/min (ref >60)
Glucose, Bld: 96 mg/dL (ref 70–99)
Potassium: 3.7 mmol/L (ref 3.5–5.1)
Sodium: 131 mmol/L — ABNORMAL LOW (ref 135–145)
Total Bilirubin: 0.6 mg/dL (ref 0.3–1.2)
Total Protein: 7 g/dL (ref 6.5–8.1)

## 2021-07-16 LAB — MAGNESIUM: Magnesium: 2 mg/dL (ref 1.7–2.4)

## 2021-07-16 MED ORDER — HEPARIN SOD (PORK) LOCK FLUSH 100 UNIT/ML IV SOLN
500.0000 [IU] | Freq: Once | INTRAVENOUS | Status: DC | PRN
  Filled 2021-07-16: qty 5

## 2021-07-16 MED ORDER — FAMOTIDINE 20 MG IN NS 100 ML IVPB
20.0000 mg | Freq: Once | INTRAVENOUS | Status: AC
  Administered 2021-07-16: 20 mg via INTRAVENOUS
  Filled 2021-07-16: qty 20

## 2021-07-16 MED ORDER — SODIUM CHLORIDE 0.9 % IV SOLN
200.0000 mg | Freq: Once | INTRAVENOUS | Status: AC
  Administered 2021-07-16: 200 mg via INTRAVENOUS
  Filled 2021-07-16: qty 8

## 2021-07-16 MED ORDER — SODIUM CHLORIDE 0.9 % IV SOLN
10.0000 mg | Freq: Once | INTRAVENOUS | Status: AC
  Administered 2021-07-16: 10 mg via INTRAVENOUS
  Filled 2021-07-16: qty 10

## 2021-07-16 MED ORDER — HEPARIN SOD (PORK) LOCK FLUSH 100 UNIT/ML IV SOLN
INTRAVENOUS | Status: AC
  Administered 2021-07-16: 500 [IU]
  Filled 2021-07-16: qty 5

## 2021-07-16 MED ORDER — CETUXIMAB CHEMO IV INJECTION 200 MG/100ML
250.0000 mg/m2 | Freq: Once | INTRAVENOUS | Status: AC
  Administered 2021-07-16: 400 mg via INTRAVENOUS
  Filled 2021-07-16: qty 200

## 2021-07-16 MED ORDER — DIPHENHYDRAMINE HCL 50 MG/ML IJ SOLN
50.0000 mg | Freq: Once | INTRAMUSCULAR | Status: AC
  Administered 2021-07-16: 50 mg via INTRAVENOUS
  Filled 2021-07-16: qty 1

## 2021-07-16 MED ORDER — SODIUM CHLORIDE 0.9 % IV SOLN
Freq: Once | INTRAVENOUS | Status: AC
Start: 2021-07-16 — End: 2021-07-16
  Filled 2021-07-16: qty 250

## 2021-07-16 MED ORDER — MONTELUKAST SODIUM 10 MG PO TABS
10.0000 mg | ORAL_TABLET | Freq: Once | ORAL | Status: AC
  Administered 2021-07-16: 10 mg via ORAL
  Filled 2021-07-16: qty 1

## 2021-07-16 MED ORDER — HEPARIN SOD (PORK) LOCK FLUSH 100 UNIT/ML IV SOLN
500.0000 [IU] | Freq: Once | INTRAVENOUS | Status: AC | PRN
  Filled 2021-07-16: qty 5

## 2021-07-16 MED ORDER — SODIUM CHLORIDE 0.9% FLUSH
10.0000 mL | Freq: Once | INTRAVENOUS | Status: DC | PRN
  Filled 2021-07-16: qty 10

## 2021-07-16 NOTE — Progress Notes (Signed)
Pt in for follow up, reports fair appetite and pain in right side of head. Denies any new concerns today.

## 2021-07-16 NOTE — Progress Notes (Signed)
Hematology/Oncology follow up note [facility] [phone] [phone]   Patient Care Team: Pcp, No as PCP - General Rockey Situ, Kathlene November, MD as PCP - Cardiology (Cardiology) Earlie Server, MD as Consulting Physician (Oncology)  CHIEF COMPLAINTS/REASON FOR VISIT:  Follow-up for head and neck cancer  HISTORY OF PRESENTING ILLNESS:  07/10/2020 she presented emergency room for evaluation of right-sided facial swelling and pain in the right side of her mouth for the past 6 weeks.  It started in her mouth with a small ulcer which progressively got worse.  She wears denture.  Not able to chew well.  She eats soft food. She was found to have right upper gum also with yellowish discharge noted. 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  Avoid piloting.,  Right orbital apex, also the right soft palate and palate and tonsil.  Furthermore there is evidence of intracranial extension along the right V2 at the foramen rotundum Small suspicious right level 1A lymph node measuring 7 mm.  Separate small indeterminate but suspicious enhancing soft tissue nodule of the left sublingual space.  Patient was referred to oncology for further evaluation management. Patient denies fever, chills, nausea vomiting diarrhea shortness of breath or cough.  Reports 10 out of 10 severe pain of right face and numbness.  She also has headache. Reports unintentional weight loss  She reports no contactable family members.  She is in the process of pointing her roommate [name] to be her power of attorney.  She also wants to record today's conversation for her roommate [name]. Patient has 50-pack-year smoking history.  # seen by ENT Dr.Juengel and had a biopsy. Biopsy was sent to [facility] and the final pathology was positive for invasive  squamous cell carcinoma, well-differentiated, keratinizing. 07/17/2020, brain MRI showed large right facial mass with right maxillary erosion involving the maxillary sinus, hard palate and pterygoid plates and muscles.  Perineural spreading along the multiple branches of the maxillary division of the right trigeminal nerve at the infraorbital canal, pterygopalatine fossa and foramen rotundum. 07/17/2020, PET scan showed Intense FDG uptake is associated with the large enhancing tumor which involves the right maxilla, right nasal cavity, right pterygoid palatine fossa, right orbital apex and right soft tissue palate and palatine tonsil. 2. Mild nonspecific FDG uptake is associated with the recently characterized suspicious right level 1A lymph node which measures 7 mm and has an SUV max of 2.12.  No signs of distant metastasis.  Aortic atherosclerosis.  Patient also reports a history of hypertension, dysrhythmia, history of V. tach.  Patient previously was on lisinopril, atenolol, amlodipine for which she is no longer taking at this point due to lack of medication coverage.  She also does not have any primary care provider. 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  On average she says she is takes 2 to 3 pills/day.  She also takes gabapentin 300 mg 3 times daily  # new onset of atrial fibrillation with rapid ventricular response.  Patient was started on metoprolol.  Eliquis for anticoagulation.  # 09/06/2020- 09/11/2020 admitted due to A. fib, hypotension, multifocal pneumonia.  Patient was treated with IV antibiotics.  Cardiology saw  the patient during admission and added amiodarone.  Patient was discharged on 5 days course of oral antibiotics, continued on metoprolol and amiodarone for atrial fibrillation.  # Dysphagia, she  declined PET tube placment.  #08/08/2020-10/11/2020 concurrent cisplatin and radiation. # 01/09/2021, PET scan showed improvement but persistent hypermetabolic activity associated with the right frontal sinus.Marked improvement in the posterior right nasopharynx with decrease in metabolic activity and tissue thickening.  Persistent but improved hypermetabolic thickened tissue in the anterior and medial right maxillary sinus.  Persistent hypermetabolic activity posterior at this level of pterygoid plate on the RIGHT. Potential new activity in the RIGHT skull base inferior medial along the sphenoid bone  # 01/14/2021, She had recurrent maxillary sinus squamous cell carcinoma and was referred to [facility] ENT for discussion of feasibility of residual disease resection.  Patient stated at [facility] for her oncology care after that. Extensive outside medical records review was performed by me via Care Everywhere.   01/27/21 MRI brain with and without contrast were notable for recurrent disease in the right pterygopalatine fossa, along V2/V3 extending into the cavernous sinus and orbital fissure, as possibleperineural spread.  01/29/2021 patient establish care with [facility] ENT Dr. Ihor Austin..  Due to the extent of disease, patient was not considered as a surgical candidate.  Dr.Blumberg sent patient to establish care with [facility] for further discussion. Patient was recommended to proceed with neoadjuvant chemotherapy  Keis regimen followed by reevaluation of surgical resection.  Patient understands that treatment goal include symptom control and increase chance of disease control and her surgical outcome.  Patient understands that if she does not respond, same treatment will be continued but with palliative intent.   Keis regimen : Cetuximab 400 mg/m2 load then 250 mg/m2 weekly, PACLItaxel 135 mg/m2 weekly,CARBOplatin AUC 2 weekly for 6 weeks Alpha gal was negative   02/06/2021  C1D1  Cetux/Taxol/Carbo, able to finish chemo  without complications [id number], C1 D8 Cetux/Taxol/Carbo able to finish chemo without complications [id number] Cetux/Taxol/Carbo, she developed hypotension, nausea and flushing.  Patient was evaluated by nurse practitioner.  Was given IV fluid bolus with improvement of BP and the patient was able to finish infusion successfully.   03/04/2021, C1D22, Cetux/Taxol/Carbo Per note, patient developed hypersensitivity to carboplatin. "tolerated Cetuximab and Paclitaxel infusion without sequelae however during her Carbo infusion, she became acutely altered, momentarily unresponsive, acutely desaturated to 70s, then 80s on RA, hypotensive 81/51 with respiratory wheezing appreciated. The infusion was stopped. NS IVF were given along with pepcid & solumedrol. Patient's vital signs slowly responded but not to baseline. Pulse was thready and skin was cold and clammy. She followed some commands, slowly. A rapid response was called for the acute change in mental status and concerning vital signs. Patient was taken to ER for further evaluation and work up"   03/25/2021 C1 D29 Cetux/Taxol/Cisplatin carboplatin was switched to cisplatin Unfortunately patient developed hypersensitivity to cisplatin as well. Patient missed 5/10, 5/17 treatments due to weakness.   04/10/2019 2 repeat MRI brain with and without contrast showed evidence of disease response, however continues to have CN V involvement with extension to the skull base, unlikely to be a surgical candidate.  Her case was noted to be discussed on the head and neck tumor board at [facility]. Patient was recommended to switch to regimen with pembrolizumab and cetuximab given the intolerance of cis-platinum  INTERVAL HISTORY Shannon Schroeder is a 65 y.o. female who has above history reviewed by me today presents for follow up visit for management of  head and neck cancer,  Patient tolerates pured food, takes nutrition supplements.  Right facial pain, on gabapentin,  fentanyl  patch 50 MCG/h every 72 hours, patient takes 4-5 Percocet per day, as well Trileptal 600 mg twice daily. Symptoms are stable. No new complaints.   Review of Systems  Constitutional:  Positive for fatigue. Negative for appetite change, chills, fever and unexpected weight change.  HENT:   Negative for hearing loss, mouth sores and voice change.        Facial swelling, pain   Eyes:  Negative for eye problems.  Respiratory:  Negative for chest tightness, cough and shortness of breath.   Cardiovascular:  Negative for chest pain and palpitations.  Gastrointestinal:  Negative for abdominal distention, abdominal pain, blood in stool, constipation, nausea and vomiting.       Dysphagia  Endocrine: Negative for hot flashes.  Genitourinary:  Negative for difficulty urinating and frequency.   Musculoskeletal:  Negative for arthralgias.  Skin:  Negative for itching and rash.  Neurological:  Negative for extremity weakness and headaches.  Hematological:  Negative for adenopathy.  Psychiatric/Behavioral:  Negative for confusion.    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    Anxiety    Arthritis    Bipolar disorder (HCC)    COPD (chronic obstructive pulmonary disease) (HCC)    NO INHALERS   Depression    Dysrhythmia    H/O V TACH   Head and neck cancer ([place]) 07/29/2020   Headache    H/O MIGRAINES   Hypertension    Seizures ([place])    X1 AFTER FALL    SURGICAL HISTORY: Past Surgical History:  Procedure Laterality Date   ABDOMINAL HYSTERECTOMY     partial   BREAST SURGERY     biopsy   CARDIAC CATHETERIZATION     X 2   CARPAL TUNNEL RELEASE Right    CARPECTOMY HAND Right    CATARACT EXTRACTION W/PHACO Left 03/08/2018   Procedure: CATARACT EXTRACTION PHACO AND INTRAOCULAR LENS PLACEMENT ([place]);  Surgeon: Birder Robson, MD;  Location: [facility] ORS;  Service: Ophthalmology;  Laterality:  Left;  [place] 00:55 AP% 13.5 CDE 7.52 Fluid pack lot # [id number]   CATARACT EXTRACTION W/PHACO Right 04/05/2018   Procedure: CATARACT EXTRACTION PHACO AND INTRAOCULAR LENS PLACEMENT ([facility]);  Surgeon: Birder Robson, MD;  Location: [facility] ORS;  Service: Ophthalmology;  Laterality: Right;  [place] 00:36 AP% 15.9 CDE 5.72 Fluid pack lot # [id number]   CEREBRAL ANEURYSM REPAIR     CHOLECYSTECTOMY     COLONOSCOPY WITH PROPOFOL N/A 04/26/2019   Procedure: COLONOSCOPY WITH PROPOFOL;  Surgeon: Jonathon Bellows, MD;  Location: [facility] ENDOSCOPY;  Service: Gastroenterology;  Laterality: N/A;   PORTACATH PLACEMENT Left 08/05/2020   Procedure: INSERTION PORT-A-CATH;  Surgeon: Herbert Pun, MD;  Location: [facility] ORS;  Service: General;  Laterality: Left;   TUBAL LIGATION      SOCIAL HISTORY: Social History   Socioeconomic History   Marital status: Divorced    Spouse name: Not on file   Number of children: Not on file   Years of education: 16   Highest education level: Bachelor's degree (e.g., BA, AB, BS)  Occupational History   Occupation: farmer  Tobacco Use   Smoking status: Every Day    Packs/day: 0.25    Years: 50.00    Pack years: 12.50    Types: Cigarettes   Smokeless tobacco: Never  Vaping Use   Vaping Use: Never used  Substance and Sexual Activity   Alcohol use: No   Drug use: No   Sexual activity: Not Currently  Other Topics Concern   Not on file  Social History Narrative   Pt [facility] at [facility].    Social Determinants of Health   Financial Resource Strain: Not on file  Food Insecurity: Not on file  Transportation Needs: Not on file  Physical Activity: Not on file  Stress: Not on file  Social Connections: Not on file  Intimate Partner Violence: Not on file    FAMILY HISTORY: Family History  Problem Relation Age of Onset   Hypertension Mother    Heart failure Mother    Hypertension Brother    Stroke Brother    Hypertension Son    Emphysema Maternal Aunt     Hypertension Paternal Aunt    Hypertension Paternal Uncle    Hypertension Maternal Grandmother    Hypertension Paternal Grandmother     ALLERGIES:  is allergic to naproxen, aspirin, belladonna alkaloids, fluoxetine, fluoxetine hcl, paroxetine hcl, phenobarbital, and prozac [fluoxetine hcl].  MEDICATIONS:  Current Outpatient Medications  Medication Sig Dispense Refill   albuterol (VENTOLIN HFA) 108 (90 Base) MCG/ACT inhaler Inhale 2 puffs into the lungs every 6 (six) hours as needed for wheezing or shortness of breath. 8 g 2   amiodarone (PACERONE) 200 MG tablet Take 1 tablet (200 mg total) by mouth daily. 90 tablet 3   apixaban (ELIQUIS) 5 MG TABS tablet Take 1 tablet (5 mg total) by mouth 2 (two) times daily. 180 tablet 3   chlorhexidine (PERIDEX) 0.12 % solution Use as directed 15 mLs in the mouth or throat 2 (two) times daily. 473 mL 6   docusate sodium (COLACE) 100 MG capsule Take 1 capsule (100 mg total) by mouth daily. 30 capsule 1   feeding supplement, ENSURE ENLIVE, (ENSURE ENLIVE) LIQD Take 237 mLs by mouth 3 (three) times daily between meals. 237 mL 12   fentaNYL (DURAGESIC) 75 MCG/HR Place 1 patch onto the skin every 3 (three) days. 10 patch 0   fluticasone-salmeterol (ADVAIR HFA) 45-21 MCG/ACT inhaler Inhale 2 puffs into the lungs 2 (two) times daily. 1 each 3   gabapentin (NEURONTIN) 300 MG capsule Take 2 capsules (600 mg total) by mouth 3 (three) times daily. 180 capsule 1   magnesium chloride (SLOW-MAG) 64 MG TBEC SR tablet Take 1 tablet (64 mg total) by mouth 2 (two) times daily. 60 tablet 1   Melatonin 10 MG TABS Take 10 mg by mouth at bedtime as needed (sleep).     Multiple Vitamins-Minerals (CENTRUM SILVER PO) Take 1 tablet by mouth daily. Gummie     oxcarbazepine (TRILEPTAL) 600 MG tablet Take 1 tablet (600 mg total) by mouth 2 (two) times daily. 60 tablet 3   Oxycodone HCl 10 MG TABS Take 1-2 tablets (10-20 mg total) by mouth every 4 (four) hours as needed. 120 tablet 0    polyethylene glycol powder (GLYCOLAX/MIRALAX) 17 GM/SCOOP powder Take 17 g by mouth daily as needed for mild constipation or moderate constipation. 255 g 0   potassium chloride SA (KLOR-CON) 20 MEQ tablet Take 2 tablets (40 mEq total) by mouth daily. 60 tablet 1   traZODone (DESYREL) 50 MG tablet Take 1-2 tablets (50-100 mg total) by mouth at bedtime as needed for sleep. 60 tablet 2   furosemide (LASIX) 20 MG tablet Take 1 tablet (20 mg total) by mouth daily as needed (  for lower extremity swelling). (Patient not taking: No sig reported) 90 tablet 1   ondansetron (ZOFRAN-ODT) 8 MG disintegrating tablet Take 1 tablet (8 mg total) by mouth every 8 (eight) hours as needed for nausea or vomiting. (Patient not taking: Reported on 07/16/2021) 90 tablet 0   oxyCODONE-acetaminophen (PERCOCET) 10-325 MG tablet Take 1 tablet by mouth every 6 (six) hours as needed for pain. (Patient not taking: Reported on 07/16/2021) 120 tablet 0   No current facility-administered medications for this visit.   Facility-Administered Medications Ordered in Other Visits  Medication Dose Route Frequency Provider Last Rate Last Admin   heparin lock flush 100 UNIT/ML injection            heparin lock flush 100 UNIT/ML injection            heparin lock flush 100 unit/mL  500 Units Intravenous Once Earlie Server, MD       heparin lock flush 100 unit/mL  500 Units Intracatheter Once PRN Earlie Server, MD       sodium chloride flush (NS) 0.9 % injection 10 mL  10 mL Intravenous PRN Earlie Server, MD   10 mL at 10/10/20 1040   sodium chloride flush (NS) 0.9 % injection 10 mL  10 mL Intravenous PRN Earlie Server, MD   10 mL at 11/13/20 0836   sodium chloride flush (NS) 0.9 % injection 10 mL  10 mL Intravenous PRN Earlie Server, MD   10 mL at 12/30/20 0838   sodium chloride flush (NS) 0.9 % injection 10 mL  10 mL Intravenous PRN Earlie Server, MD   10 mL at 01/14/21 0921   sodium chloride flush (NS) 0.9 % injection 10 mL  10 mL Intravenous PRN Earlie Server, MD   10 mL  at 06/11/21 0815   sodium chloride flush (NS) 0.9 % injection 10 mL  10 mL Intravenous PRN Earlie Server, MD   10 mL at 07/09/21 0814     PHYSICAL EXAMINATION: ECOG PERFORMANCE STATUS: 1 - Symptomatic but completely ambulatory Vitals:   07/16/21 1012  BP: 121/61  Pulse: 76  Resp: 16  Temp: (!) 97.1 F (36.2 C)  SpO2: 99%   Filed Weights   07/16/21 1012  Weight: 120 lb 14.4 oz (54.8 kg)    Physical Exam Constitutional:      General: She is not in acute distress.    Comments: Thin built.  Patient sits in the wheelchair  HENT:     Head: Normocephalic and atraumatic.     Mouth/Throat:     Comments: She has right palate deficiency Right facial swelling and tenderness. Eyes:     General: No scleral icterus.    Comments: Right eyelid ptosis  Cardiovascular:     Rate and Rhythm: Normal rate and regular rhythm.     Heart sounds: Normal heart sounds.  Pulmonary:     Effort: Pulmonary effort is normal. No respiratory distress.     Breath sounds: No wheezing.  Abdominal:     General: Bowel sounds are normal. There is no distension.     Palpations: Abdomen is soft.  Musculoskeletal:        General: No deformity. Normal range of motion.     Cervical back: Normal range of motion and neck supple.  Skin:    General: Skin is warm and dry.     Findings: No erythema or rash.  Neurological:     Mental Status: She is alert and oriented to person, place, and time.  Mental status is at baseline.     Cranial Nerves: No cranial nerve deficit.     Coordination: Coordination normal.  Psychiatric:        Mood and Affect: Mood normal.    LABORATORY DATA:  I have reviewed the data as listed Lab Results  Component Value Date   WBC 9.8 07/16/2021   HGB 10.5 (L) 07/16/2021   HCT 32.4 (L) 07/16/2021   MCV 92.3 07/16/2021   PLT 468 (H) 07/16/2021   Recent Labs    04/14/21 1317 04/23/21 0810 07/02/21 0925 07/09/21 0814 07/16/21 0909  NA 129*   < > 132* 127* 131*  K 4.5   < > 3.0* 4.0  3.7  CL 92*   < > 95* 91* 97*  CO2 29   < > '26 28 27  '$ GLUCOSE 106*   < > 124* 98 96  BUN 19   < > 7* 13 14  CREATININE 0.46   < > 0.48 <0.30* 0.42*  CALCIUM 9.0   < > 8.7* 8.9 8.5*  GFRNONAA >60   < > >60 NOT CALCULATED >60  PROT 7.1   < > 7.5 7.6 7.0  ALBUMIN 3.1*   < > 3.3* 3.3* 3.3*  AST 15   < > '17 18 16  '$ ALT 10   < > '10 12 12  '$ ALKPHOS 115   < > 107 108 99  BILITOT <0.1*   < > 0.4 <0.1* 0.6  BILIDIR <0.1  --   --   --   --   IBILI NOT CALCULATED  --   --   --   --    < > = values in this interval not displayed.    Iron/TIBC/Ferritin/ %Sat    Component Value Date/Time   IRON 46 09/09/2020 0938   IRON 37 05/03/2018 1826   TIBC 210 (L) 09/09/2020 0938   FERRITIN 136 09/09/2020 0938   FERRITIN 52 05/03/2018 1826   IRONPCTSAT 22 09/09/2020 0938      RADIOGRAPHIC STUDIES: I have personally reviewed the radiological images as listed and agreed with the findings in the report. MR Brain W Wo Contrast  Result Date: 06/27/2021 CLINICAL DATA:  Maxillary sinus cancer. EXAM: MRI HEAD WITHOUT AND WITH CONTRAST TECHNIQUE: Multiplanar, multiecho pulse sequences of the brain and surrounding structures were obtained without and with intravenous contrast. CONTRAST:  68m GADAVIST GADOBUTROL 1 MMOL/ML IV SOLN COMPARISON:  04/09/2021 and earlier FINDINGS: Brain: No evidence of parenchymal or cisternal involvement. No infarct, hemorrhage, hydrocephalus, or collection. 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 Skull and upper cervical spine: Unremarkable prior craniotomy Sinuses/Orbits: Right maxillary sinus cancer with extensive extra spinous spread and surgical treatment. Although a different protocol was used on prior, there is decreased bulk of dominant residual tumor in the right masticator space which defies reproducible measurement given the irregular shape and infiltrative appearance. Fairly thin rind of enhancing tissue around the remaining right maxillary sinus is non  progressed. Perineural tumor with dural infiltration/thickening overlying the right Meckel's cave and medial middle cranial fossa is unchanged. Tumor over rides the right mandibular foramen without visible tumor infiltration at the level of the edentulous mandible. The orbits appear clear. IMPRESSION: 1. Positive treatment response with decreasing tumor bulk in the right masticator space. Known perineural tumor spread with dural thickening at the right middle cranial fossa that is nonprogressive. 2. No evidence of cisternal or parenchymal brain involvement. 3. At future follow-up imaging, recommend trigeminal nerve protocol or  facial protocol rather than standard brain MRI. Electronically Signed   By: Monte Fantasia M.D.   On: 06/27/2021 18:06   NM PET Image Restag (PS) Skull Base To Thigh  Result Date: 07/09/2021 CLINICAL DATA:  Subsequent treatment strategy for head/neck cancer. EXAM: NUCLEAR MEDICINE PET SKULL BASE TO THIGH TECHNIQUE: 6.1 mCi F-18 FDG was injected intravenously. Full-ring PET imaging was performed from the skull base to thigh after the radiotracer. CT data was obtained and used for attenuation correction and anatomic localization. Fasting blood glucose: 91 mg/dl COMPARISON:  CT abdomen 05/01/2021 and PET 01/09/2021. CT chest 09/26/2020. 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 No hypermetabolic lymph nodes. Incidental CT findings: Associated osseous destruction surrounding the right maxillary sinus. CHEST: No hypermetabolic mediastinal, hilar or axillary lymph nodes. No hypermetabolic pulmonary nodules. Incidental CT findings: Left IJ Port-A-Cath terminates in the SVC. Heart is at the upper limits of normal in size. No pericardial or pleural effusion.  Basilar predominant peribronchovascular ground-glass and mild septal thickening. ABDOMEN/PELVIS: No abnormal hypermetabolism in the liver, adrenal glands, spleen or pancreas. No hypermetabolic lymph nodes. Incidental CT findings: Liver is unremarkable. Cholecystectomy. Adrenal glands, kidneys, spleen, pancreas, stomach and bowel are grossly unremarkable. Atherosclerotic calcification of the aorta. SKELETON: No abnormal osseous hypermetabolism. Very mild hypermetabolism associated with a fracture of the superior endplate of L1 is new from 01/09/2021. Incidental CT findings: Degenerative changes in the spine. IMPRESSION: 1. Continued response to therapy as evidenced by decreased wall thickening and hypermetabolism associated with a cavitary right maxillary/nasopharyngeal mass. No evidence of distant metastatic disease. 2. Subacute compression of the L1 superior endplate, new from 01/09/2021. 3.  Aortic atherosclerosis (ICD10-I70.0). Electronically Signed   By: Lorin Picket M.D.   On: 07/09/2021 13:28       ASSESSMENT & PLAN:  1. 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[place])   2. History of atrial fibrillation   3. Trigeminal neuropathy   4. Moderate protein-calorie malnutrition ([place])   5. Encounter for antineoplastic immunotherapy    #Maxillary sinus squamous cell carcinoma, s/p concurrent chemotherapy and radiation-recurrence-Unresectable  Labs are reviewed and discussed with patient. Proceed with cycle 5 Keytruda and Cetuximab.  8/262022 MRI brain, comparing to MRI done in June 2022 at outside facility, showed positive treatment response with decreasing tumor bulk in the right masticator space. Known perineural tumor spread with dural thickening at the right middle cranial fossa that is nonprogressive.  No evidence of cisternal or parenchymal brain involvement. 07/08/2021 PET, Continued response to therapy as evidenced by decreased wall,  thickening and hypermetabolism associated with a cavitary  right maxillary/nasopharyngeal mass. No evidence of distant metastatic disease. 2. Subacute compression of the L1 superior endplate, new from 01/09/2021. 3.  Aortic atherosclerosis    #Neoplasm related pain Continue fentanyl patch to 75 MCG/h every 72 hours, continue Percocet 10/325 every 4-6 hours as needed.   Continue gabapentin 600 mg 3 times daily.  Continue Trileptal 600 mg twice daily recommended by Dr. Mickeal Skinner.  Since PET has improved,discussed with Dr.Chrystal who recommends to hold off RT for now.   #Malnutrition, dysphagia She appears having better oral intake and has gained weight.  We will hold off tube placement at this point.  Continue nutrition supplementation.  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 Na level is stable131 Liberate dietary salt intake.    #Hypokalemia, continue potassium chloride 13mq BID.  # Hypomagnesia, stable Continue Slow-Mag 1 tablet daily.    #Atrial fibrillation on amiodarone and metoprolol.Eliquis  All questions were answered. The patient knows to call the clinic with any problems questions or concerns.   Return of visit:    1 week for lab Cetuximab 2 weeks for lab and Cetuximab 3 weeks, for Lab MD cetuximab  ZEarlie Server MD, PhD [facility] 07/16/2021

## 2021-07-23 ENCOUNTER — Inpatient Hospital Stay: Payer: Medicare Other

## 2021-07-23 ENCOUNTER — Other Ambulatory Visit: Payer: Self-pay

## 2021-07-23 VITALS — BP 115/71 | HR 95 | Temp 98.0°F | Resp 16 | Wt 119.1 lb

## 2021-07-23 DIAGNOSIS — C310 Malignant neoplasm of maxillary sinus: Secondary | ICD-10-CM

## 2021-07-23 DIAGNOSIS — Z5112 Encounter for antineoplastic immunotherapy: Secondary | ICD-10-CM | POA: Diagnosis not present

## 2021-07-23 DIAGNOSIS — E876 Hypokalemia: Secondary | ICD-10-CM

## 2021-07-23 DIAGNOSIS — G893 Neoplasm related pain (acute) (chronic): Secondary | ICD-10-CM

## 2021-07-23 DIAGNOSIS — C760 Malignant neoplasm of head, face and neck: Secondary | ICD-10-CM

## 2021-07-23 DIAGNOSIS — Z8679 Personal history of other diseases of the circulatory system: Secondary | ICD-10-CM

## 2021-07-23 LAB — COMPREHENSIVE METABOLIC PANEL
ALT: 18 U/L (ref 0–44)
AST: 21 U/L (ref 15–41)
Albumin: 3 g/dL — ABNORMAL LOW (ref 3.5–5.0)
Alkaline Phosphatase: 198 U/L — ABNORMAL HIGH (ref 38–126)
Anion gap: 11 (ref 5–15)
BUN: 10 mg/dL (ref 8–23)
CO2: 26 mmol/L (ref 22–32)
Calcium: 8.5 mg/dL — ABNORMAL LOW (ref 8.9–10.3)
Chloride: 93 mmol/L — ABNORMAL LOW (ref 98–111)
Creatinine, Ser: 0.42 mg/dL — ABNORMAL LOW (ref 0.44–1.00)
GFR, Estimated: >60 mL/min (ref >60)
Glucose, Bld: 123 mg/dL — ABNORMAL HIGH (ref 70–99)
Potassium: 3.1 mmol/L — ABNORMAL LOW (ref 3.5–5.1)
Sodium: 130 mmol/L — ABNORMAL LOW (ref 135–145)
Total Bilirubin: 0.7 mg/dL (ref 0.3–1.2)
Total Protein: 7.2 g/dL (ref 6.5–8.1)

## 2021-07-23 LAB — CBC WITH DIFFERENTIAL/PLATELET
Abs Immature Granulocytes: 0.04 10*3/uL (ref 0.00–0.07)
Basophils Absolute: 0.1 10*3/uL (ref 0.0–0.1)
Basophils Relative: 1 %
Eosinophils Absolute: 0.5 10*3/uL (ref 0.0–0.5)
Eosinophils Relative: 4 %
HCT: 31 % — ABNORMAL LOW (ref 36.0–46.0)
Hemoglobin: 10 g/dL — ABNORMAL LOW (ref 12.0–15.0)
Immature Granulocytes: 0 %
Lymphocytes Relative: 4 %
Lymphs Abs: 0.5 10*3/uL — ABNORMAL LOW (ref 0.7–4.0)
MCH: 29.3 pg (ref 26.0–34.0)
MCHC: 32.3 g/dL (ref 30.0–36.0)
MCV: 90.9 fL (ref 80.0–100.0)
Monocytes Absolute: 1.5 10*3/uL — ABNORMAL HIGH (ref 0.1–1.0)
Monocytes Relative: 13 %
Neutro Abs: 9.3 10*3/uL — ABNORMAL HIGH (ref 1.7–7.7)
Neutrophils Relative %: 78 %
Platelets: 426 10*3/uL — ABNORMAL HIGH (ref 150–400)
RBC: 3.41 MIL/uL — ABNORMAL LOW (ref 3.87–5.11)
RDW: 16.4 % — ABNORMAL HIGH (ref 11.5–15.5)
WBC: 12 10*3/uL — ABNORMAL HIGH (ref 4.0–10.5)
nRBC: 0 % (ref 0.0–0.2)

## 2021-07-23 LAB — MAGNESIUM: Magnesium: 1.7 mg/dL (ref 1.7–2.4)

## 2021-07-23 MED ORDER — MONTELUKAST SODIUM 10 MG PO TABS
10.0000 mg | ORAL_TABLET | Freq: Once | ORAL | Status: AC
  Administered 2021-07-23: 10 mg via ORAL
  Filled 2021-07-23: qty 1

## 2021-07-23 MED ORDER — FAMOTIDINE 20 MG IN NS 100 ML IVPB
20.0000 mg | Freq: Once | INTRAVENOUS | Status: AC
  Administered 2021-07-23: 20 mg via INTRAVENOUS
  Filled 2021-07-23: qty 20

## 2021-07-23 MED ORDER — SODIUM CHLORIDE 0.9 % IV SOLN
10.0000 mg | Freq: Once | INTRAVENOUS | Status: AC
  Administered 2021-07-23: 10 mg via INTRAVENOUS
  Filled 2021-07-23: qty 10

## 2021-07-23 MED ORDER — DIPHENHYDRAMINE HCL 50 MG/ML IJ SOLN
50.0000 mg | Freq: Once | INTRAMUSCULAR | Status: AC
  Administered 2021-07-23: 50 mg via INTRAVENOUS
  Filled 2021-07-23: qty 1

## 2021-07-23 MED ORDER — POTASSIUM CHLORIDE 20 MEQ/100ML IV SOLN
20.0000 meq | Freq: Once | INTRAVENOUS | Status: AC
  Administered 2021-07-23: 20 meq via INTRAVENOUS

## 2021-07-23 MED ORDER — SODIUM CHLORIDE 0.9 % IV SOLN
Freq: Once | INTRAVENOUS | Status: AC
  Filled 2021-07-23: qty 250

## 2021-07-23 MED ORDER — SODIUM CHLORIDE 0.9 % IV SOLN
INTRAVENOUS | Status: DC
  Filled 2021-07-23: qty 250

## 2021-07-23 MED ORDER — HEPARIN SOD (PORK) LOCK FLUSH 100 UNIT/ML IV SOLN
INTRAVENOUS | Status: AC
  Filled 2021-07-23: qty 5

## 2021-07-23 MED ORDER — CETUXIMAB CHEMO IV INJECTION 200 MG/100ML
250.0000 mg/m2 | Freq: Once | INTRAVENOUS | Status: AC
  Administered 2021-07-23: 400 mg via INTRAVENOUS
  Filled 2021-07-23: qty 200

## 2021-07-23 NOTE — Patient Instructions (Signed)
[facility] ONCOLOGY  Discharge Instructions: Thank you for choosing [facility] to provide your oncology and hematology care.  If you have a lab appointment with the [facility], please go directly to the [facility] and check in at the registration area.  Wear comfortable clothing and clothing appropriate for easy access to any Portacath or PICC line.   We strive to give you quality time with your provider. You may need to reschedule your appointment if you arrive late (15 or more minutes).  Arriving late affects you and other patients whose appointments are after yours.  Also, if you miss three or more appointments without notifying the office, you may be dismissed from the clinic at the provider's discretion.      For prescription refill requests, have your pharmacy contact our office and allow 72 hours for refills to be completed.    Today you received the following chemotherapy and/or immunotherapy agents : erbitux   To help prevent nausea and vomiting after your treatment, we encourage you to take your nausea medication as directed.  BELOW ARE SYMPTOMS THAT SHOULD BE REPORTED IMMEDIATELY: *FEVER GREATER THAN 100.4 F (38 C) OR HIGHER *CHILLS OR SWEATING *NAUSEA AND VOMITING THAT IS NOT CONTROLLED WITH YOUR NAUSEA MEDICATION *UNUSUAL SHORTNESS OF BREATH *UNUSUAL BRUISING OR BLEEDING *URINARY PROBLEMS (pain or burning when urinating, or frequent urination) *BOWEL PROBLEMS (unusual diarrhea, constipation, pain near the anus) TENDERNESS IN MOUTH AND THROAT WITH OR WITHOUT PRESENCE OF ULCERS (sore throat, sores in mouth, or a toothache) UNUSUAL RASH, SWELLING OR PAIN  UNUSUAL VAGINAL DISCHARGE OR ITCHING   Items with * indicate a potential emergency and should be followed up as soon as possible or go to the Emergency Department if any problems should occur.  Please show the CHEMOTHERAPY ALERT CARD or IMMUNOTHERAPY ALERT CARD at check-in to  the Emergency Department and triage nurse.  Should you have questions after your visit or need to cancel or reschedule your appointment, please contact [facility]  [phone] and follow the prompts.  Office hours are 8:00 a.m. to 4:30 p.m. Monday - Friday. Please note that voicemails left after 4:00 p.m. may not be returned until the following business day.  We are closed weekends and major holidays. You have access to a nurse at all times for urgent questions. Please call the main number to the clinic [phone] and follow the prompts.  For any non-urgent questions, you may also contact your provider using MyChart. We now offer e-Visits for anyone 90 and older to request care online for non-urgent symptoms. For details visit [email or web address].   Also download the MyChart app! Go to the app store, search "MyChart", open the app, select [facility], and log in with your MyChart username and password.  Due to Covid, a mask is required upon entering the hospital/clinic. If you do not have a mask, one will be given to you upon arrival. For doctor visits, patients may have 1 support person aged 53 or older with them. For treatment visits, patients cannot have anyone with them due to current Covid guidelines and our immunocompromised population.

## 2021-07-29 ENCOUNTER — Emergency Department: Payer: Medicare Other

## 2021-07-29 ENCOUNTER — Other Ambulatory Visit: Payer: Self-pay

## 2021-07-29 DIAGNOSIS — R042 Hemoptysis: Secondary | ICD-10-CM | POA: Diagnosis present

## 2021-07-29 DIAGNOSIS — Z66 Do not resuscitate: Secondary | ICD-10-CM | POA: Diagnosis not present

## 2021-07-29 DIAGNOSIS — C310 Malignant neoplasm of maxillary sinus: Secondary | ICD-10-CM | POA: Diagnosis present

## 2021-07-29 DIAGNOSIS — J440 Chronic obstructive pulmonary disease with acute lower respiratory infection: Secondary | ICD-10-CM | POA: Diagnosis present

## 2021-07-29 DIAGNOSIS — Z20822 Contact with and (suspected) exposure to covid-19: Secondary | ICD-10-CM | POA: Diagnosis present

## 2021-07-29 DIAGNOSIS — Z8673 Personal history of transient ischemic attack (TIA), and cerebral infarction without residual deficits: Secondary | ICD-10-CM

## 2021-07-29 DIAGNOSIS — M1990 Unspecified osteoarthritis, unspecified site: Secondary | ICD-10-CM | POA: Diagnosis present

## 2021-07-29 DIAGNOSIS — F17210 Nicotine dependence, cigarettes, uncomplicated: Secondary | ICD-10-CM | POA: Diagnosis present

## 2021-07-29 DIAGNOSIS — D849 Immunodeficiency, unspecified: Secondary | ICD-10-CM | POA: Diagnosis present

## 2021-07-29 DIAGNOSIS — E869 Volume depletion, unspecified: Secondary | ICD-10-CM | POA: Diagnosis present

## 2021-07-29 DIAGNOSIS — E876 Hypokalemia: Secondary | ICD-10-CM | POA: Diagnosis present

## 2021-07-29 DIAGNOSIS — T50905A Adverse effect of unspecified drugs, medicaments and biological substances, initial encounter: Secondary | ICD-10-CM | POA: Diagnosis present

## 2021-07-29 DIAGNOSIS — Z888 Allergy status to other drugs, medicaments and biological substances status: Secondary | ICD-10-CM

## 2021-07-29 DIAGNOSIS — J180 Bronchopneumonia, unspecified organism: Principal | ICD-10-CM | POA: Diagnosis present

## 2021-07-29 DIAGNOSIS — Z7901 Long term (current) use of anticoagulants: Secondary | ICD-10-CM

## 2021-07-29 DIAGNOSIS — R000 Tachycardia, unspecified: Secondary | ICD-10-CM | POA: Diagnosis present

## 2021-07-29 DIAGNOSIS — Z825 Family history of asthma and other chronic lower respiratory diseases: Secondary | ICD-10-CM

## 2021-07-29 DIAGNOSIS — Z886 Allergy status to analgesic agent status: Secondary | ICD-10-CM

## 2021-07-29 DIAGNOSIS — E871 Hypo-osmolality and hyponatremia: Secondary | ICD-10-CM | POA: Diagnosis present

## 2021-07-29 DIAGNOSIS — I10 Essential (primary) hypertension: Secondary | ICD-10-CM | POA: Diagnosis present

## 2021-07-29 DIAGNOSIS — I480 Paroxysmal atrial fibrillation: Secondary | ICD-10-CM | POA: Diagnosis present

## 2021-07-29 DIAGNOSIS — J189 Pneumonia, unspecified organism: Secondary | ICD-10-CM | POA: Diagnosis not present

## 2021-07-29 DIAGNOSIS — J9601 Acute respiratory failure with hypoxia: Secondary | ICD-10-CM | POA: Diagnosis present

## 2021-07-29 DIAGNOSIS — Z8249 Family history of ischemic heart disease and other diseases of the circulatory system: Secondary | ICD-10-CM

## 2021-07-29 DIAGNOSIS — Z9221 Personal history of antineoplastic chemotherapy: Secondary | ICD-10-CM

## 2021-07-29 DIAGNOSIS — G893 Neoplasm related pain (acute) (chronic): Secondary | ICD-10-CM | POA: Diagnosis present

## 2021-07-29 DIAGNOSIS — D75838 Other thrombocytosis: Secondary | ICD-10-CM | POA: Diagnosis present

## 2021-07-29 DIAGNOSIS — D6832 Hemorrhagic disorder due to extrinsic circulating anticoagulants: Secondary | ICD-10-CM | POA: Diagnosis present

## 2021-07-29 DIAGNOSIS — F3160 Bipolar disorder, current episode mixed, unspecified: Secondary | ICD-10-CM | POA: Diagnosis present

## 2021-07-29 DIAGNOSIS — R1310 Dysphagia, unspecified: Secondary | ICD-10-CM | POA: Diagnosis present

## 2021-07-29 DIAGNOSIS — Z8679 Personal history of other diseases of the circulatory system: Secondary | ICD-10-CM

## 2021-07-29 DIAGNOSIS — Z8589 Personal history of malignant neoplasm of other organs and systems: Secondary | ICD-10-CM

## 2021-07-29 DIAGNOSIS — R112 Nausea with vomiting, unspecified: Secondary | ICD-10-CM | POA: Diagnosis present

## 2021-07-29 DIAGNOSIS — F419 Anxiety disorder, unspecified: Secondary | ICD-10-CM | POA: Diagnosis present

## 2021-07-29 DIAGNOSIS — Z823 Family history of stroke: Secondary | ICD-10-CM

## 2021-07-29 DIAGNOSIS — D539 Nutritional anemia, unspecified: Secondary | ICD-10-CM | POA: Diagnosis present

## 2021-07-29 DIAGNOSIS — Z79899 Other long term (current) drug therapy: Secondary | ICD-10-CM

## 2021-07-29 LAB — COMPREHENSIVE METABOLIC PANEL
ALT: 21 U/L (ref 0–44)
AST: 21 U/L (ref 15–41)
Albumin: 2.6 g/dL — ABNORMAL LOW (ref 3.5–5.0)
Alkaline Phosphatase: 216 U/L — ABNORMAL HIGH (ref 38–126)
Anion gap: 14 (ref 5–15)
BUN: 11 mg/dL (ref 8–23)
CO2: 27 mmol/L (ref 22–32)
Calcium: 8.5 mg/dL — ABNORMAL LOW (ref 8.9–10.3)
Chloride: 93 mmol/L — ABNORMAL LOW (ref 98–111)
Creatinine, Ser: 0.46 mg/dL (ref 0.44–1.00)
GFR, Estimated: >60 mL/min (ref >60)
Glucose, Bld: 103 mg/dL — ABNORMAL HIGH (ref 70–99)
Potassium: 3.2 mmol/L — ABNORMAL LOW (ref 3.5–5.1)
Sodium: 134 mmol/L — ABNORMAL LOW (ref 135–145)
Total Bilirubin: 0.5 mg/dL (ref 0.3–1.2)
Total Protein: 7.4 g/dL (ref 6.5–8.1)

## 2021-07-29 LAB — CBC
HCT: 32 % — ABNORMAL LOW (ref 36.0–46.0)
Hemoglobin: 10.6 g/dL — ABNORMAL LOW (ref 12.0–15.0)
MCH: 30.4 pg (ref 26.0–34.0)
MCHC: 33.1 g/dL (ref 30.0–36.0)
MCV: 91.7 fL (ref 80.0–100.0)
Platelets: 512 10*3/uL — ABNORMAL HIGH (ref 150–400)
RBC: 3.49 MIL/uL — ABNORMAL LOW (ref 3.87–5.11)
RDW: 16 % — ABNORMAL HIGH (ref 11.5–15.5)
WBC: 10.2 10*3/uL (ref 4.0–10.5)
nRBC: 0 % (ref 0.0–0.2)

## 2021-07-29 LAB — TROPONIN I (HIGH SENSITIVITY): Troponin I (High Sensitivity): 10 ng/L (ref <18)

## 2021-07-29 IMAGING — CR DG CHEST 1V PORT
1 series · 1 of 1 positions shown · non-contrast
Comparison: Chest x-ray [DATE].

CLINICAL DATA: 65-year-old female with history of cough and
weakness for the past several days.

EXAM:
PORTABLE CHEST 1 VIEW

[chest ap]
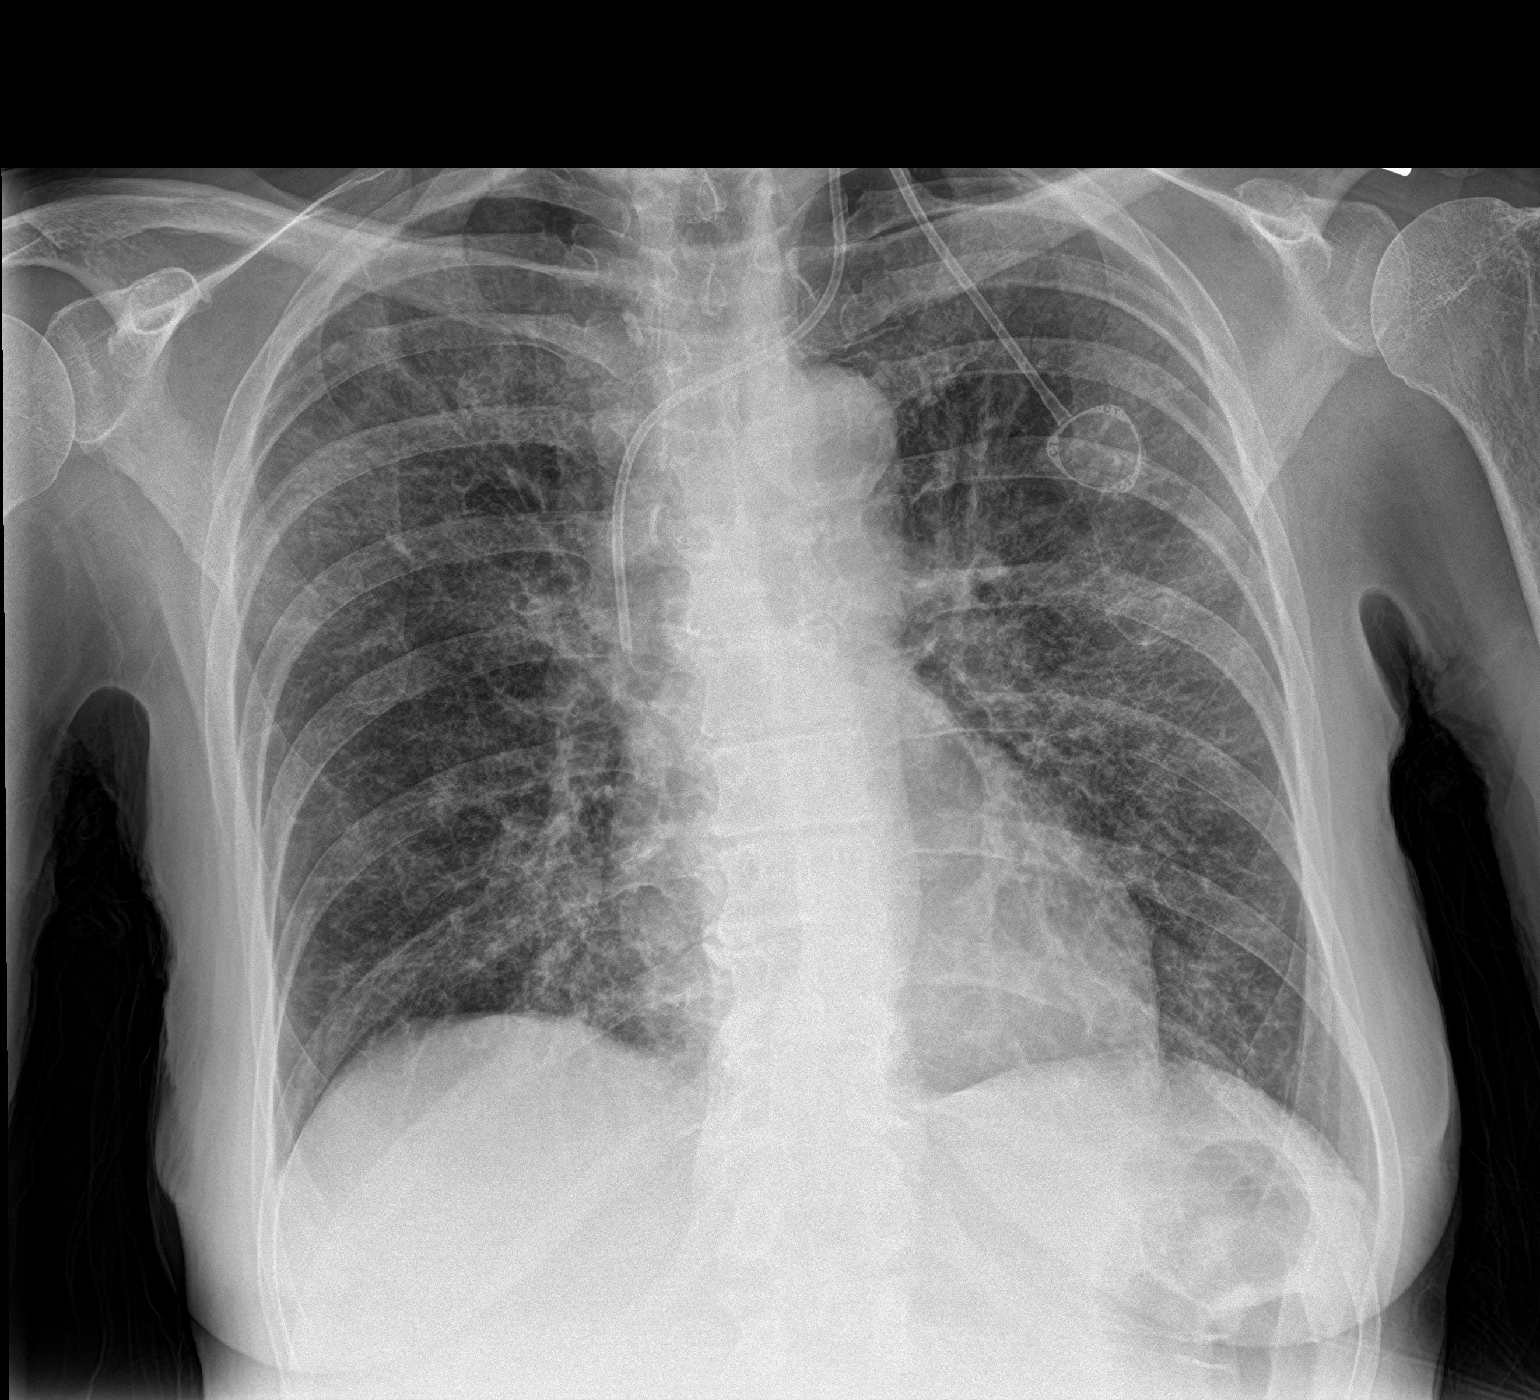

[1 of 1 positions shown; findings below may reference images not displayed]

FINDINGS: Left-sided internal jugular single-lumen porta cath with tip
terminating in the distal superior vena cava. Lung volumes are
normal. Diffuse interstitial prominence and peribronchial cuffing.
No confluent consolidative airspace disease. No pleural effusions.
No pneumothorax. No pulmonary nodule or mass noted. Pulmonary
vasculature and the cardiomediastinal silhouette are within normal
limits. Atherosclerotic calcifications in the thoracic aorta.
IMPRESSION: 1. The appearance the chest suggests severe bronchitis, potentially
with developing multilobar bronchopneumonia.
2. Aortic atherosclerosis.

## 2021-07-29 NOTE — ED Triage Notes (Signed)
Pt presents to ED with c/o of feeling weak for the past few days. Pt is a cancer pt and gets chemo every Wednesday. Pt also c/o of nausea. Pt denies fevers or chills.

## 2021-07-29 NOTE — ED Provider Notes (Signed)
Emergency Medicine Provider Triage Evaluation Note  Shannon Schroeder , a 65 y.o. female  was evaluated in triage.  Patient has a history of head and throat cancer, currently undergoing treatment.  For the last 2 days she is felt very weak.  Family member states she usually feels bad after her treatments for couple days but bounces back.  She has not bounced back and seems to be declining.  She has not been able to eat much.  She has been dry heaving.  No fevers.  She has had a cough.   Review of Systems  Positive: Fatigue, weakness, difficulty swallowing, cough, chest pain, nausea Negative: Fevers, chills, vomiting, diarrhea  Physical Exam  BP (!) 146/80 (BP Location: Right Arm)   Pulse (!) 115   Temp 98.6 F (37 C) (Oral)   Resp 19   LMP 11/19/1988 (Exact Date)  Gen:   Awake, no distress difficult historian presents with family member.  Presents in a wheelchair Resp:  Normal effort, no respiratory distress MSK:   Moves extremities without difficulty     Medical Decision Making  Medically screening exam initiated at 6:57 PM.  Appropriate orders placed.  [name] was informed that the remainder of the evaluation will be completed by another provider, this initial triage assessment does not replace that evaluation, and the importance of remaining in the ED until their evaluation is complete.     Duanne Guess, PA-C 07/29/21 1900    Naaman Plummer, MD 07/29/21 [phone]

## 2021-07-30 ENCOUNTER — Inpatient Hospital Stay
Admission: EM | Admit: 2021-07-30 | Discharge: 2021-08-08 | DRG: 193 | Disposition: A | Discharge status: Home / Self Care | Payer: Medicare Other | Attending: Internal Medicine | Admitting: Internal Medicine

## 2021-07-30 ENCOUNTER — Encounter: Payer: Self-pay | Admitting: Radiology

## 2021-07-30 ENCOUNTER — Inpatient Hospital Stay: Payer: Medicare Other

## 2021-07-30 ENCOUNTER — Emergency Department: Payer: Medicare Other

## 2021-07-30 DIAGNOSIS — G893 Neoplasm related pain (acute) (chronic): Secondary | ICD-10-CM | POA: Diagnosis present

## 2021-07-30 DIAGNOSIS — Z823 Family history of stroke: Secondary | ICD-10-CM | POA: Diagnosis not present

## 2021-07-30 DIAGNOSIS — E876 Hypokalemia: Secondary | ICD-10-CM | POA: Diagnosis present

## 2021-07-30 DIAGNOSIS — Z8249 Family history of ischemic heart disease and other diseases of the circulatory system: Secondary | ICD-10-CM | POA: Diagnosis not present

## 2021-07-30 DIAGNOSIS — J9601 Acute respiratory failure with hypoxia: Secondary | ICD-10-CM

## 2021-07-30 DIAGNOSIS — J180 Bronchopneumonia, unspecified organism: Secondary | ICD-10-CM

## 2021-07-30 DIAGNOSIS — D75838 Other thrombocytosis: Secondary | ICD-10-CM | POA: Diagnosis present

## 2021-07-30 DIAGNOSIS — J811 Chronic pulmonary edema: Secondary | ICD-10-CM

## 2021-07-30 DIAGNOSIS — J9600 Acute respiratory failure, unspecified whether with hypoxia or hypercapnia: Secondary | ICD-10-CM | POA: Diagnosis present

## 2021-07-30 DIAGNOSIS — Z515 Encounter for palliative care: Secondary | ICD-10-CM | POA: Diagnosis not present

## 2021-07-30 DIAGNOSIS — Z66 Do not resuscitate: Secondary | ICD-10-CM | POA: Diagnosis not present

## 2021-07-30 DIAGNOSIS — A419 Sepsis, unspecified organism: Secondary | ICD-10-CM

## 2021-07-30 DIAGNOSIS — F3160 Bipolar disorder, current episode mixed, unspecified: Secondary | ICD-10-CM | POA: Diagnosis present

## 2021-07-30 DIAGNOSIS — J189 Pneumonia, unspecified organism: Secondary | ICD-10-CM

## 2021-07-30 DIAGNOSIS — Z20822 Contact with and (suspected) exposure to covid-19: Secondary | ICD-10-CM | POA: Diagnosis present

## 2021-07-30 DIAGNOSIS — R1310 Dysphagia, unspecified: Secondary | ICD-10-CM | POA: Diagnosis present

## 2021-07-30 DIAGNOSIS — Z8679 Personal history of other diseases of the circulatory system: Secondary | ICD-10-CM

## 2021-07-30 DIAGNOSIS — T50905A Adverse effect of unspecified drugs, medicaments and biological substances, initial encounter: Secondary | ICD-10-CM | POA: Diagnosis present

## 2021-07-30 DIAGNOSIS — R042 Hemoptysis: Secondary | ICD-10-CM | POA: Diagnosis present

## 2021-07-30 DIAGNOSIS — C760 Malignant neoplasm of head, face and neck: Secondary | ICD-10-CM | POA: Diagnosis not present

## 2021-07-30 DIAGNOSIS — D849 Immunodeficiency, unspecified: Secondary | ICD-10-CM

## 2021-07-30 DIAGNOSIS — R531 Weakness: Secondary | ICD-10-CM

## 2021-07-30 DIAGNOSIS — D6832 Hemorrhagic disorder due to extrinsic circulating anticoagulants: Secondary | ICD-10-CM | POA: Diagnosis present

## 2021-07-30 DIAGNOSIS — E869 Volume depletion, unspecified: Secondary | ICD-10-CM | POA: Diagnosis present

## 2021-07-30 DIAGNOSIS — I480 Paroxysmal atrial fibrillation: Secondary | ICD-10-CM | POA: Diagnosis present

## 2021-07-30 DIAGNOSIS — R1319 Other dysphagia: Secondary | ICD-10-CM | POA: Diagnosis not present

## 2021-07-30 DIAGNOSIS — I10 Essential (primary) hypertension: Secondary | ICD-10-CM | POA: Diagnosis present

## 2021-07-30 DIAGNOSIS — D539 Nutritional anemia, unspecified: Secondary | ICD-10-CM | POA: Diagnosis present

## 2021-07-30 DIAGNOSIS — E871 Hypo-osmolality and hyponatremia: Secondary | ICD-10-CM | POA: Diagnosis present

## 2021-07-30 DIAGNOSIS — C310 Malignant neoplasm of maxillary sinus: Secondary | ICD-10-CM | POA: Diagnosis present

## 2021-07-30 DIAGNOSIS — J440 Chronic obstructive pulmonary disease with acute lower respiratory infection: Secondary | ICD-10-CM | POA: Diagnosis present

## 2021-07-30 DIAGNOSIS — L8990 Pressure ulcer of unspecified site, unspecified stage: Secondary | ICD-10-CM | POA: Insufficient documentation

## 2021-07-30 DIAGNOSIS — F17210 Nicotine dependence, cigarettes, uncomplicated: Secondary | ICD-10-CM | POA: Diagnosis present

## 2021-07-30 DIAGNOSIS — Z7901 Long term (current) use of anticoagulants: Secondary | ICD-10-CM | POA: Diagnosis not present

## 2021-07-30 LAB — URINALYSIS, COMPLETE (UACMP) WITH MICROSCOPIC
Bacteria, UA: NONE SEEN
Glucose, UA: NEGATIVE mg/dL
Hgb urine dipstick: NEGATIVE
Ketones, ur: 20 mg/dL — AB
Nitrite: NEGATIVE
Protein, ur: 100 mg/dL — AB
Specific Gravity, Urine: 1.032 — ABNORMAL HIGH (ref 1.005–1.030)
pH: 6 (ref 5.0–8.0)

## 2021-07-30 LAB — MAGNESIUM: Magnesium: 1.8 mg/dL (ref 1.7–2.4)

## 2021-07-30 LAB — LACTIC ACID, PLASMA
Lactic Acid, Venous: 0.8 mmol/L (ref 0.5–1.9)
Lactic Acid, Venous: 0.8 mmol/L (ref 0.5–1.9)

## 2021-07-30 LAB — TROPONIN I (HIGH SENSITIVITY): Troponin I (High Sensitivity): 12 ng/L (ref <18)

## 2021-07-30 LAB — RESP PANEL BY RT-PCR (FLU A&B, COVID) ARPGX2
Influenza A by PCR: NEGATIVE
Influenza B by PCR: NEGATIVE
SARS Coronavirus 2 by RT PCR: NEGATIVE

## 2021-07-30 MED ORDER — POLYETHYLENE GLYCOL 3350 17 G PO PACK: 17.0000 g | PACK | Freq: Every day | ORAL | Status: DC | PRN

## 2021-07-30 MED ORDER — SODIUM CHLORIDE 0.9 % IV SOLN
500.0000 mg | Freq: Every day | INTRAVENOUS | Status: AC
  Administered 2021-07-30 – 2021-08-03 (×5): 500 mg via INTRAVENOUS
  Filled 2021-07-30 (×5): qty 500

## 2021-07-30 MED ORDER — OXYCODONE HCL 5 MG PO TABS
10.0000 mg | ORAL_TABLET | ORAL | Status: DC | PRN
  Administered 2021-07-30: 10 mg via ORAL
  Filled 2021-07-30: qty 2

## 2021-07-30 MED ORDER — AMIODARONE HCL 200 MG PO TABS
200.0000 mg | ORAL_TABLET | Freq: Every day | ORAL | Status: DC
  Administered 2021-07-30: 200 mg via ORAL
  Filled 2021-07-30 (×2): qty 1

## 2021-07-30 MED ORDER — MOMETASONE FURO-FORMOTEROL FUM 100-5 MCG/ACT IN AERO
2.0000 | INHALATION_SPRAY | Freq: Two times a day (BID) | RESPIRATORY_TRACT | Status: DC
  Administered 2021-07-30 – 2021-08-08 (×17): 2 via RESPIRATORY_TRACT
  Filled 2021-07-30 (×2): qty 8.8

## 2021-07-30 MED ORDER — MELATONIN 5 MG PO TABS
10.0000 mg | ORAL_TABLET | Freq: Every evening | ORAL | Status: DC | PRN
  Filled 2021-07-30: qty 2

## 2021-07-30 MED ORDER — LACTATED RINGERS IV SOLN: INTRAVENOUS | Status: DC

## 2021-07-30 MED ORDER — LACTATED RINGERS IV BOLUS (SEPSIS)
1000.0000 mL | Freq: Once | INTRAVENOUS | Status: AC
  Administered 2021-07-30: 1000 mL via INTRAVENOUS

## 2021-07-30 MED ORDER — SODIUM CHLORIDE 0.9 % IV SOLN: 250.0000 mL | INTRAVENOUS | Status: DC | PRN

## 2021-07-30 MED ORDER — SODIUM CHLORIDE 0.9 % IV SOLN
2.0000 g | Freq: Every day | INTRAVENOUS | Status: AC
  Administered 2021-07-30 – 2021-08-03 (×5): 2 g via INTRAVENOUS
  Filled 2021-07-30 (×2): qty 20
  Filled 2021-07-30 (×2): qty 2
  Filled 2021-07-30: qty 20

## 2021-07-30 MED ORDER — TRAZODONE HCL 50 MG PO TABS: 50.0000 mg | ORAL_TABLET | Freq: Every evening | ORAL | Status: DC | PRN

## 2021-07-30 MED ORDER — DOCUSATE SODIUM 100 MG PO CAPS
100.0000 mg | ORAL_CAPSULE | Freq: Every day | ORAL | Status: DC
  Administered 2021-07-30 – 2021-08-08 (×7): 100 mg via ORAL
  Filled 2021-07-30 (×10): qty 1

## 2021-07-30 MED ORDER — ENSURE ENLIVE PO LIQD
237.0000 mL | Freq: Three times a day (TID) | ORAL | Status: DC
  Administered 2021-07-30: 237 mL via ORAL

## 2021-07-30 MED ORDER — IOHEXOL 350 MG/ML SOLN
75.0000 mL | Freq: Once | INTRAVENOUS | Status: AC | PRN
  Administered 2021-07-30: 75 mL via INTRAVENOUS
  Filled 2021-07-30: qty 75

## 2021-07-30 MED ORDER — GABAPENTIN 300 MG PO CAPS
600.0000 mg | ORAL_CAPSULE | Freq: Three times a day (TID) | ORAL | Status: DC
  Administered 2021-07-30 – 2021-08-08 (×25): 600 mg via ORAL
  Filled 2021-07-30 (×25): qty 2

## 2021-07-30 MED ORDER — OXCARBAZEPINE 300 MG PO TABS
600.0000 mg | ORAL_TABLET | Freq: Two times a day (BID) | ORAL | Status: DC
  Administered 2021-07-30 – 2021-08-08 (×17): 600 mg via ORAL
  Filled 2021-07-30 (×19): qty 2

## 2021-07-30 MED ORDER — ADULT MULTIVITAMIN W/MINERALS CH
1.0000 | ORAL_TABLET | Freq: Every day | ORAL | Status: DC
  Administered 2021-07-30 – 2021-08-05 (×2): 1 via ORAL
  Filled 2021-07-30 (×5): qty 1

## 2021-07-30 MED ORDER — FENTANYL 75 MCG/HR TD PT72
1.0000 | MEDICATED_PATCH | TRANSDERMAL | Status: DC
  Administered 2021-08-05 – 2021-08-08 (×2): 1 via TRANSDERMAL
  Filled 2021-07-30 (×5): qty 1

## 2021-07-30 MED ORDER — SODIUM CHLORIDE 0.9% FLUSH: 3.0000 mL | Freq: Two times a day (BID) | INTRAVENOUS | Status: DC

## 2021-07-30 MED ORDER — SODIUM CHLORIDE 0.9 % IV SOLN
1.0000 g | Freq: Once | INTRAVENOUS | Status: AC
  Administered 2021-07-30: 1 g via INTRAVENOUS
  Filled 2021-07-30: qty 1

## 2021-07-30 MED ORDER — SODIUM CHLORIDE 0.9% FLUSH: 3.0000 mL | INTRAVENOUS | Status: DC | PRN

## 2021-07-30 MED ORDER — ONDANSETRON HCL 4 MG/2ML IJ SOLN
4.0000 mg | INTRAMUSCULAR | Status: AC
  Administered 2021-07-30: 4 mg via INTRAVENOUS
  Filled 2021-07-30: qty 2

## 2021-07-30 MED ORDER — VANCOMYCIN HCL 1250 MG/250ML IV SOLN
1250.0000 mg | Freq: Once | INTRAVENOUS | Status: AC
  Administered 2021-07-30: 1250 mg via INTRAVENOUS
  Filled 2021-07-30: qty 250

## 2021-07-30 MED ORDER — IPRATROPIUM-ALBUTEROL 0.5-2.5 (3) MG/3ML IN SOLN: 3.0000 mL | Freq: Four times a day (QID) | RESPIRATORY_TRACT | Status: DC | PRN

## 2021-07-30 MED ORDER — POTASSIUM CHLORIDE CRYS ER 20 MEQ PO TBCR
40.0000 meq | EXTENDED_RELEASE_TABLET | Freq: Every day | ORAL | Status: DC
  Administered 2021-07-30 – 2021-08-01 (×2): 40 meq via ORAL
  Filled 2021-07-30 (×2): qty 2

## 2021-07-30 MED ORDER — APIXABAN 5 MG PO TABS
5.0000 mg | ORAL_TABLET | Freq: Two times a day (BID) | ORAL | Status: DC
  Administered 2021-07-30 – 2021-08-08 (×17): 5 mg via ORAL
  Filled 2021-07-30: qty 2
  Filled 2021-07-30 (×9): qty 1
  Filled 2021-07-30: qty 2
  Filled 2021-07-30 (×4): qty 1
  Filled 2021-07-30 (×2): qty 2

## 2021-07-30 NOTE — Progress Notes (Signed)
SLP Cancellation Note  Patient Details Name: CABELLA KIMM MRN: [id number] DOB: [birth year]   Cancelled treatment:        Chart reviewed, Pt visited. Pt reported she cannot participate in bedside swallow eval as she doesn't feel well enough to try any PO's. Reports she has been only taking in liquids to the extent of taking even pureed type foods and thinning them into a liquid form to the consistency of Ensure. Noted hypernasal speech and Pt admits to liquid sometimes coming from nose when swallowing. Pt would benefit from Mod ba swallow assess oral pharyngeal phase of swallow once she is able to tolerate Po's without nausea. Reviewed nutrition notes over the past few months indicating possible PEG but holding off as Pt was taking in enough PO's. May need to readdress at some point. Pt reports she is usually able to take in Ensure plus but wants it at room temperature. Will reattempt assessment once feeling able to take Po's. Rec allowing Pt to sip on room temperature Ensure plus if she requests.   Lucila Maine 07/30/2021, 1:13 PM

## 2021-07-30 NOTE — Consult Note (Signed)
CODE SEPSIS - PHARMACY COMMUNICATION  **Broad Spectrum Antibiotics should be administered within 1 hour of Sepsis diagnosis**  Time Code Sepsis Called/Page Received: 9201  Antibiotics Ordered: 0071  Time of 1st antibiotic administration: [place] ,PharmD, BCPS Clinical Pharmacist  07/30/2021  7:29 AM

## 2021-07-30 NOTE — ED Notes (Signed)
Pt transported to CT via stretcher at this time.  

## 2021-07-30 NOTE — ED Notes (Signed)
Pt assisted with bedpan. Pt has small BM. Pt cleaned up and repositioned in bed. External cath placed.

## 2021-07-30 NOTE — ED Notes (Signed)
MS at bedside assessing pt at this time.

## 2021-07-30 NOTE — ED Provider Notes (Addendum)
[facility] Emergency Department Provider Note  ____________________________________________   Event Date/Time   First MD Initiated Contact with Patient 07/30/21 0518     (approximate)  I have reviewed the triage vital signs and the nursing notes.   HISTORY  Chief Complaint Weakness    HPI Shannon Schroeder is a 65 y.o. female with extensive chronic medical history that includes cancer of the head and neck for which she is actively undergoing chemotherapy and immunotherapy managed by Dr. Tasia Catchings.  She presents for about a week of gradually worsening generalized fatigue and malaise.  She gets weekly chemotherapy has not had the opportunity to speak with her oncologist about her worsening symptoms.  In addition to being very tired and fatigued, she has developed nausea and occasional generalized abdominal pain.  She has had less oral intake.  She has also started to be short of breath without a cough but even at rest.  Symptoms are worse with exertion.  Nothing in particular makes her feel better.  Overall her symptoms are severe.  She denies fever, chest pain, and dysuria.     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   Encounter for antineoplastic immunotherapy 04/23/2021   Moderate protein-calorie malnutrition ([place]) 04/14/2021   Dehydration 10/14/2020   SVT (supraventricular tachycardia) (HCC)    Hypomagnesemia    Dysphagia    Severe sepsis ([place]) 09/06/2020   Multifocal pneumonia 09/06/2020    Drug-induced constipation 08/26/2020   Hypotension 08/12/2020   Encounter for antineoplastic chemotherapy 08/08/2020   Atrial fibrillation with RVR ([place]) 08/08/2020   Elevated troponin 08/08/2020   Leukocytosis 08/08/2020   Hypokalemia 08/08/2020   SIRS (systemic inflammatory response syndrome) ([place]) 08/08/2020   Goals of care, counseling/discussion 08/06/2020   Head and neck cancer ([place]) 07/29/2020   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) 06/25/2018   Hypertensive emergency 06/16/2018   Subarachnoid bleed ([place]) 06/16/2018   TIA (transient ischemic attack) 06/15/2018   Tobacco abuse 06/02/2018   COPD (chronic obstructive pulmonary disease) ([place]) 04/21/2018   URI (upper respiratory infection) 03/31/2018   Hypertension 03/31/2018   Tachycardia 02/02/2018   Rash of body 02/02/2018   Hyponatremia 02/02/2018    Past Surgical History:  Procedure Laterality Date   ABDOMINAL HYSTERECTOMY     partial   BREAST SURGERY     biopsy   CARDIAC CATHETERIZATION     X 2   CARPAL TUNNEL RELEASE Right    CARPECTOMY HAND Right    CATARACT EXTRACTION W/PHACO Left 03/08/2018   Procedure: CATARACT EXTRACTION PHACO AND INTRAOCULAR LENS PLACEMENT ([place]);  Surgeon: Birder Robson, MD;  Location: [facility] ORS;  Service: Ophthalmology;  Laterality: Left;  [place] 00:55 AP% 13.5 CDE 7.52 Fluid pack lot # [id number]   CATARACT EXTRACTION W/PHACO Right 04/05/2018   Procedure: CATARACT EXTRACTION PHACO AND INTRAOCULAR LENS PLACEMENT (IOC);  Surgeon: Birder Robson, MD;  Location: [facility] ORS;  Service: Ophthalmology;  Laterality: Right;  [place] 00:36 AP% 15.9 CDE 5.72 Fluid pack lot # [id number]   CEREBRAL ANEURYSM REPAIR     CHOLECYSTECTOMY     COLONOSCOPY WITH PROPOFOL N/A 04/26/2019    Procedure: COLONOSCOPY WITH PROPOFOL;  Surgeon: Jonathon Bellows, MD;  Location: [facility] ENDOSCOPY;  Service: Gastroenterology;  Laterality: N/A;   PORTACATH PLACEMENT Left 08/05/2020   Procedure: INSERTION PORT-A-CATH;  Surgeon: Herbert Pun, MD;  Location: [facility] ORS;  Service: General;  Laterality: Left;   TUBAL LIGATION      Prior to Admission medications   Medication Sig Start Date End Date Taking? Authorizing Provider  albuterol (VENTOLIN HFA) 108 (90 Base) MCG/ACT inhaler Inhale 2 puffs into the lungs every 6 (six) hours as needed for wheezing or shortness of breath. 05/14/21   Earlie Server, MD  amiodarone (PACERONE) 200 MG tablet Take 1 tablet (200 mg total) by mouth daily. 07/15/21   Minna Merritts, MD  apixaban (ELIQUIS) 5 MG TABS tablet Take 1 tablet (5 mg total) by mouth 2 (two) times daily. 11/25/20   Minna Merritts, MD  chlorhexidine (PERIDEX) 0.12 % solution Use as directed 15 mLs in the mouth or throat 2 (two) times daily. 05/21/21   Earlie Server, MD  docusate sodium (COLACE) 100 MG capsule Take 1 capsule (100 mg total) by mouth daily. 08/26/20   Earlie Server, MD  feeding supplement, ENSURE ENLIVE, (ENSURE ENLIVE) LIQD Take 237 mLs by mouth 3 (three) times daily between meals. 08/10/20   Fritzi Mandes, MD  fentaNYL (DURAGESIC) 75 MCG/HR Place 1 patch onto the skin every 3 (three) days. 07/09/21   Borders, Kirt Boys, NP  fluticasone-salmeterol (ADVAIR HFA) [phone] MCG/ACT inhaler Inhale 2 puffs into the lungs 2 (two) times daily. 06/04/21   Earlie Server, MD  furosemide (LASIX) 20 MG tablet Take 1 tablet (20 mg total) by mouth daily as needed (for lower extremity swelling). Patient not taking: No sig reported 09/20/20 12/19/20  Rise Mu, PA-C  gabapentin (NEURONTIN) 300 MG capsule Take 2 capsules (600 mg total) by mouth 3 (three) times daily. 07/09/21   Borders, Kirt Boys, NP  magnesium chloride (SLOW-MAG) 64 MG TBEC SR tablet Take 1 tablet (64 mg total) by mouth 2 (two) times daily. 10/07/20   Earlie Server, MD   Melatonin 10 MG TABS Take 10 mg by mouth at bedtime as needed (sleep).    [provider]  Multiple Vitamins-Minerals (CENTRUM SILVER PO) Take 1 tablet by mouth daily. Gummie    [provider]  ondansetron (ZOFRAN-ODT) 8 MG disintegrating tablet Take 1 tablet (8 mg total) by mouth every 8 (eight) hours as needed for nausea or vomiting. Patient not taking: Reported on 07/16/2021 10/14/20   Earlie Server, MD  oxcarbazepine (TRILEPTAL) 600 MG tablet Take 1 tablet (600 mg total) by mouth 2 (two) times daily. 07/11/21   Vaslow, Acey Lav, MD  Oxycodone HCl 10 MG TABS Take 1-2 tablets (10-20 mg total) by mouth every 4 (four) hours as needed. 07/15/21   Borders, Kirt Boys, NP  polyethylene glycol powder (GLYCOLAX/MIRALAX) 17 GM/SCOOP powder Take 17 g by mouth daily as needed for mild constipation or moderate constipation. 09/11/20   Mercy Riding, MD  potassium chloride SA (KLOR-CON) 20 MEQ tablet Take  2 tablets (40 mEq total) by mouth daily. 05/21/21   Earlie Server, MD  traZODone (DESYREL) 50 MG tablet Take 1-2 tablets (50-100 mg total) by mouth at bedtime as needed for sleep. 07/15/21   Borders, Kirt Boys, NP  prochlorperazine (COMPAZINE) 10 MG tablet Take 1 tablet (10 mg total) by mouth every 6 (six) hours as needed (Nausea or vomiting). 07/29/20 04/14/21  Earlie Server, MD    Allergies Naproxen, Aspirin, Belladonna alkaloids, Fluoxetine, Fluoxetine hcl, Paroxetine hcl, Phenobarbital, and Prozac [fluoxetine hcl]  Family History  Problem Relation Age of Onset   Hypertension Mother    Heart failure Mother    Hypertension Brother    Stroke Brother    Hypertension Son    Emphysema Maternal Aunt    Hypertension Paternal Aunt    Hypertension Paternal Uncle    Hypertension Maternal Grandmother    Hypertension Paternal Grandmother     Social History Social History   Tobacco Use   Smoking status: Every Day    Packs/day: 0.25    Years: 50.00    Pack years: 12.50    Types: Cigarettes   Smokeless  tobacco: Never  Vaping Use   Vaping Use: Never used  Substance Use Topics   Alcohol use: No   Drug use: No    Review of Systems Constitutional: Positive for general malaise and fatigue.  No fever/chills. Eyes: No visual changes. ENT: No sore throat. Cardiovascular: Denies chest pain. Respiratory: Positive for shortness of breath. Gastrointestinal: Intermittent generalized abdominal pain with nausea, no vomiting. Genitourinary: Negative for dysuria. Musculoskeletal: Negative for neck pain.  Negative for back pain. Integumentary: Negative for rash. Neurological: Negative for headaches, focal weakness or numbness.   ____________________________________________   PHYSICAL EXAM:  VITAL SIGNS: ED Triage Vitals  Enc Vitals Group     BP 07/29/21 1853 (!) 146/80     Pulse Rate 07/29/21 1853 (!) 115     Resp 07/29/21 1853 19     Temp 07/29/21 1853 98.6 F (37 C)     Temp Source 07/29/21 1853 Oral     SpO2 07/29/21 1857 (!) 87 %     Weight --      Height --      Head Circumference --      Peak Flow --      Pain Score 07/29/21 1854 2     Pain Loc --      Pain Edu? --      Excl. in [place]? --     Constitutional: Alert and oriented.  Eyes: Conjunctivae are injected which she says is normal as result of her treatment and condition. Head: Chronic right-sided facial abnormalities as a result of her face and neck cancer. Nose: No congestion/rhinnorhea. Mouth/Throat: Patient is wearing a mask. Neck: No stridor.  No meningeal signs.   Cardiovascular: Normal rate, regular rhythm. Good peripheral circulation. Respiratory: Normal respiratory effort but slightly increased respiratory rate. no retractions. Gastrointestinal: Soft and nondistended but with generalized tenderness to palpation throughout the abdomen with no localized peritonitis. Musculoskeletal: No lower extremity tenderness nor edema. No gross deformities of extremities. Neurologic:  Normal speech and language. No gross  focal neurologic deficits are appreciated.  Skin:  Skin is warm, dry and intact. Psychiatric: Mood and affect are normal. Speech and behavior are normal.  ____________________________________________   LABS (all labs ordered are listed, but only abnormal results are displayed)  Labs Reviewed  CBC - Abnormal; Notable for the following components:      Result Value  RBC 3.49 (*)    Hemoglobin 10.6 (*)    HCT 32.0 (*)    RDW 16.0 (*)    Platelets 512 (*)    All other components within normal limits  COMPREHENSIVE METABOLIC PANEL - Abnormal; Notable for the following components:   Sodium 134 (*)    Potassium 3.2 (*)    Chloride 93 (*)    Glucose, Bld 103 (*)    Calcium 8.5 (*)    Albumin 2.6 (*)    Alkaline Phosphatase 216 (*)    All other components within normal limits  URINALYSIS, COMPLETE (UACMP) WITH MICROSCOPIC - Abnormal; Notable for the following components:   Color, Urine AMBER (*)    APPearance CLOUDY (*)    Specific Gravity, Urine 1.032 (*)    Bilirubin Urine SMALL (*)    Ketones, ur 20 (*)    Protein, ur 100 (*)    Leukocytes,Ua SMALL (*)    All other components within normal limits  CULTURE, BLOOD (ROUTINE X 2)  CULTURE, BLOOD (ROUTINE X 2)  URINE CULTURE  RESP PANEL BY RT-PCR (FLU A&B, COVID) ARPGX2  LACTIC ACID, PLASMA  MAGNESIUM  LACTIC ACID, PLASMA  TROPONIN I (HIGH SENSITIVITY)  TROPONIN I (HIGH SENSITIVITY)   ____________________________________________  EKG  ED ECG REPORT I, Hinda Kehr, the attending physician, personally viewed and interpreted this ECG.  Date: 07/29/2021 EKG Time: 20: 31 Rate: 118 Rhythm: Sinus tachycardia QRS Axis: normal Intervals: normal ST/T Wave abnormalities: Non-specific ST segment / T-wave changes, but no clear evidence of acute ischemia. Narrative Interpretation: no definitive evidence of acute ischemia; does not meet STEMI criteria.  ____________________________________________  RADIOLOGY I, Hinda Kehr,  personally viewed and evaluated these images (plain radiographs) as part of my medical decision making, as well as reviewing the written report by the radiologist.  ED MD interpretation: Multi lobar bronchopneumonia suspected on chest x-ray.  CT chest/abdomen/pelvis further suggest multi lobar pneumonitis versus pneumonia.  No intra-abdominal pathology.  Official radiology report(s): CT CHEST ABDOMEN PELVIS W CONTRAST  Result Date: 07/30/2021 CLINICAL DATA:  65 year old female with history of multilobar pneumonia on chest x-ray and abdominal pain. 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 Possible sepsis. EXAM: CT CHEST, ABDOMEN, AND PELVIS WITH CONTRAST TECHNIQUE: Multidetector CT imaging of the chest, abdomen and pelvis was performed following the standard protocol during bolus administration of intravenous contrast. CONTRAST:  56mL OMNIPAQUE IOHEXOL 350 MG/ML SOLN COMPARISON:  PET-CT 07/08/2021. FINDINGS: CT CHEST FINDINGS Cardiovascular: Heart size is normal. There is no significant pericardial fluid, thickening or pericardial calcification. Aortic atherosclerosis. No definite coronary artery calcifications. Left internal jugular single-lumen porta cath with tip terminating in the distal superior vena cava. Mediastinum/Nodes: Multiple prominent but nonenlarged mediastinal lymph nodes are nonspecific. No definite pathologic lymphadenopathy identified in mediastinal or hilar nodal stations. Esophagus is unremarkable in appearance. No axillary lymphadenopathy. Lungs/Pleura: Worsening patchy multifocal areas of ground-glass attenuation, septal thickening and thickening of the peribronchovascular interstitium is seen throughout the lungs bilaterally with no discernible craniocaudal gradient. No confluent consolidative airspace disease. No pleural effusions. No definite suspicious appearing pulmonary nodules or masses are noted. Musculoskeletal: There are no aggressive appearing lytic or blastic lesions  noted in the visualized portions of the skeleton. CT ABDOMEN PELVIS FINDINGS Hepatobiliary: No suspicious cystic or solid hepatic lesions. No intra or extrahepatic biliary ductal dilatation. Status post cholecystectomy. Pancreas: No pancreatic mass. No pancreatic ductal dilatation. No pancreatic or peripancreatic fluid collections or inflammatory changes. Spleen: Unremarkable. Adrenals/Urinary Tract: Bilateral kidneys and adrenal glands  are normal in appearance. No hydroureteronephrosis. Urinary bladder is normal in appearance. Stomach/Bowel: The appearance of the stomach is normal. There is no pathologic dilatation of small bowel or colon. Normal appendix. Vascular/Lymphatic: Aortic atherosclerosis, without evidence of aneurysm or dissection in the abdominal or pelvic vasculature. No lymphadenopathy noted in the abdomen or pelvis. Reproductive: Status post hysterectomy. Ovaries are not confidently identified may be surgically absent or atrophic. Other: No significant volume of ascites.  No pneumoperitoneum. Musculoskeletal: Chronic compression fracture of anterior aspect of the superior endplate of L1, similar to the prior study from 05/01/2021 with minimal loss of anterior vertebral body height. There are no aggressive appearing lytic or blastic lesions noted in the visualized portions of the skeleton. IMPRESSION: 1. Marked worsening multifocal ground-glass attenuation and septal thickening in the lungs bilaterally when compared to prior PET-CT 07/08/2021. Findings are favored to reflect a drug-induced pneumonitis, but could alternatively reflect multilobar bilateral pneumonia from atypical infection. Further clinical evaluation is recommended. 2. No acute findings are noted in the abdomen or pelvis. 3. No definite signs of metastatic disease in the chest, abdomen or pelvis. 4. Aortic atherosclerosis. 5. Additional incidental findings, as above. Electronically Signed   By: Vinnie Langton M.D.   On: 07/30/2021  06:53   DG Chest Port 1 View  Result Date: 07/29/2021 CLINICAL DATA:  65 year old female with history of cough and weakness for the past several days. EXAM: PORTABLE CHEST 1 VIEW COMPARISON:  Chest x-ray 12/30/2020. FINDINGS: Left-sided internal jugular single-lumen porta cath with tip terminating in the distal superior vena cava. Lung volumes are normal. Diffuse interstitial prominence and peribronchial cuffing. No confluent consolidative airspace disease. No pleural effusions. No pneumothorax. No pulmonary nodule or mass noted. Pulmonary vasculature and the cardiomediastinal silhouette are within normal limits. Atherosclerotic calcifications in the thoracic aorta. IMPRESSION: 1. The appearance the chest suggests severe bronchitis, potentially with developing multilobar bronchopneumonia. 2. Aortic atherosclerosis. Electronically Signed   By: Vinnie Langton M.D.   On: 07/29/2021 19:39    ____________________________________________   PROCEDURES   Procedure(s) performed (including Critical Care):  .Critical Care Performed by: Hinda Kehr, MD Authorized by: Hinda Kehr, MD   Critical care provider statement:    Critical care time (minutes):  30   Critical care time was exclusive of:  Separately billable procedures and treating other patients   Critical care was necessary to treat or prevent imminent or life-threatening deterioration of the following conditions:  Sepsis and respiratory failure   Critical care was time spent personally by me on the following activities:  Development of treatment plan with patient or surrogate, discussions with consultants, evaluation of patient's response to treatment, examination of patient, obtaining history from patient or surrogate, ordering and performing treatments and interventions, ordering and review of laboratory studies, ordering and review of radiographic studies, pulse oximetry, re-evaluation of patient's condition and review of old charts .1-3  Lead EKG Interpretation Performed by: Hinda Kehr, MD Authorized by: Hinda Kehr, MD     Interpretation: normal     ECG rate:  80   ECG rate assessment: normal     Rhythm: sinus rhythm     Ectopy: none     Conduction: normal     ____________________________________________   INITIAL IMPRESSION / MDM / ASSESSMENT AND PLAN / ED COURSE  As part of my medical decision making, I reviewed the following data within the [facility] notes reviewed and incorporated, Labs reviewed , EKG interpreted , Old chart reviewed, Radiograph reviewed ,  Discussed with admitting physician , and Notes from prior ED visits   Differential diagnosis includes, but is not limited to, sepsis, pneumonia, bacteremia, UTI, chemotherapy side effect.  The patient is on the cardiac monitor to evaluate for evidence of arrhythmia and/or significant heart rate changes.  Patient's labs are notable for mild hyponatremia and hypokalemia, otherwise generally normal comprehensive metabolic panel other than a slight alkaline phosphatase elevation.  Urinalysis is uncertain, there are white cells and red cells present but nitrate negative.  Urine culture is pending.  Lactic acid was not obtained in triage and is now pending.  I personally reviewed the patient's imaging and agree with the radiologist's interpretation that the patient may have a developing Multi focal pneumonia.  Given her constellation of symptoms and immunocompromised state, I will obtain a CT chest/abdomen/pelvis to both rule out acute intra-abdominal abnormality and to further characterize the nondiagnostic chest x-ray results.  Given the borderline sepsis, and immunocompromise state, I am initiating treatment with cefepime 2 g IV and vancomycin per pharmacy consult.  I also explained in detail with the pharmacy consult what was the clinical situation.  I am providing 1 L LR bolus for her volume depletion.  Patient declines analgesia at  this time.  Providing Zofran 4 mg IV for her nausea.     Clinical Course as of 07/30/21 0724  Wed Jul 30, 2021  2376 CT CHEST ABDOMEN PELVIS W CONTRAST CT of the chest abdomen and pelvis is notable for markedly worsening multifocal pneumonia versus pneumonitis.  Given her immunocompromise state I think the right plan is to proceed with antibiotic treatment empirically until inflammation versus infection can be further established.  There is no indication of an acute abnormality in the abdomen or pelvis.  Consulting hospitalist for admission. [CF]  0715 Upon further consideration of her tachypnea, new oxygen requirement of 2 L for an SPO2 of 88%, tachycardia upon triage, etc., I am making her code sepsis.  She is not severe sepsis nor septic shock. [CF]  [phone] Discussed case by phone with Dr. Francine Graven with the hospitalist service and she will admit. [CF]    Clinical Course User Index [CF] Hinda Kehr, MD     ____________________________________________  FINAL CLINICAL IMPRESSION(S) / ED DIAGNOSES  Final diagnoses:  Sepsis ([place])  Generalized weakness  Bronchopneumonia  Immunocompromised state ([place])  Acute respiratory failure with hypoxemia ([place])     MEDICATIONS GIVEN DURING THIS VISIT:  Medications  vancomycin (VANCOREADY) IVPB 1250 mg/250 mL (has no administration in time range)  ondansetron (ZOFRAN) injection 4 mg (has no administration in time range)  lactated ringers bolus 1,000 mL (0 mLs Intravenous Paused 07/30/21 0652)  ceFEPIme (MAXIPIME) 1 g in sodium chloride 0.9 % 100 mL IVPB (1 g Intravenous New Bag/Given 07/30/21 0653)  iohexol (OMNIPAQUE) 350 MG/ML injection 75 mL (75 mLs Intravenous Contrast Given 07/30/21 5176)     ED Discharge Orders     None        Note:  This document was prepared using Dragon voice recognition software and may include unintentional dictation errors.   Hinda Kehr, MD 07/30/21 Justin Mend    Hinda Kehr, MD 07/30/21 [phone]

## 2021-07-30 NOTE — Sepsis Progress Note (Signed)
ELINK tracking the Code Sepsis.

## 2021-07-30 NOTE — H&P (Signed)
History and Physical    Shannon Schroeder ZOX:[id number] DOB: [birth year] DOA: 07/30/2021  PCP: Pcp, No   Patient coming from: Home  I have personally briefly reviewed patient's old medical records in [place]  Chief Complaint: Weakness  HPI: Shannon Schroeder is a 65 y.o. 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 Patient states that she gets weekly chemotherapy and her last dose was on 07/23/21.  Since that treatment she has had nausea, dry heaves and occasional generalized abdominal pain.  She has also had reduced oral intake.  She has difficulty swallowing and usually is on a pured diet but denies having any recent choking episodes. She has noted some shortness of breath mostly with exertion but denies having any cough, no fever or chills. She denies having any changes in her bowel habits, no urinary symptoms, no palpitations, no diaphoresis, no leg swelling, no dizziness, no lightheadedness, no focal deficits or blurred vision. Labs show sodium 134, potassium 3.2, chloride 93, bicarb 27, glucose 103, BUN 11, creatinine 0.46, calcium 8.5, anion gap 14, magnesium 1.8, alkaline phosphatase 216, albumin 2.6, AST 21, ALT 21, total protein 7.4, troponin 12, lactic acid 0.8, white count 10.2, hemoglobin 10.6, hematocrit 32, MCV 99.7, RDW 16.0, platelet count 512 Respiratory viral panel is negative Chest x-ray reviewed by me shows severe bronchitis, potentially with developing multilobar bronchopneumonia. Aortic atherosclerosis. CT scan of chest, abdomen and pelvis shows marked worsening multifocal groundglass attenuation and septal thickening in the lungs bilaterally.  Findings are favored to reflect a drug-induced pneumonitis but could alternatively reflect  multilobar bilateral pneumonia. No acute findings in the abdomen or pelvis. Twelve-lead EKG reviewed by me shows sinus tachycardia with ST and T wave changes in the lateral leads.    ED Course: Patient is a 65 year old female with a history of head and neck cancer who presents to the ER for evaluation of increased weakness and fatigue as well as poor oral intake, nausea and dry heaves. She is found to have multifocal pneumonia and will be admitted to the hospital for further evaluation.   Review of Systems: As per HPI otherwise all other systems reviewed and negative.    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    Anxiety    Arthritis    Bipolar disorder (HCC)    COPD (chronic obstructive pulmonary disease) (HCC)    NO INHALERS   Depression    Dysrhythmia    H/O V TACH   Head and neck cancer ([place]) 07/29/2020   Headache    H/O MIGRAINES   Hypertension    Seizures ([place])    X1 AFTER FALL    Past Surgical History:  Procedure Laterality Date   ABDOMINAL HYSTERECTOMY     partial   BREAST SURGERY     biopsy   CARDIAC CATHETERIZATION     X 2   CARPAL TUNNEL RELEASE Right    CARPECTOMY HAND Right    CATARACT EXTRACTION W/PHACO Left 03/08/2018   Procedure: CATARACT EXTRACTION PHACO AND INTRAOCULAR LENS PLACEMENT ([place]);  Surgeon: Birder Robson, MD;  Location: [facility] ORS;  Service: Ophthalmology;  Laterality: Left;  [place] 00:55 AP% 13.5 CDE 7.52 Fluid pack lot # [id number]   CATARACT EXTRACTION W/PHACO Right  04/05/2018   Procedure: CATARACT EXTRACTION PHACO AND INTRAOCULAR LENS PLACEMENT ([facility]);  Surgeon: Birder Robson, MD;  Location: [facility] ORS;  Service: Ophthalmology;  Laterality: Right;  [place] 00:36 AP% 15.9 CDE 5.72 Fluid pack lot # [id number]   CEREBRAL ANEURYSM REPAIR     CHOLECYSTECTOMY     COLONOSCOPY WITH PROPOFOL N/A 04/26/2019   Procedure: COLONOSCOPY WITH PROPOFOL;  Surgeon: Jonathon Bellows,  MD;  Location: [facility];  Service: Gastroenterology;  Laterality: N/A;   PORTACATH PLACEMENT Left 08/05/2020   Procedure: INSERTION PORT-A-CATH;  Surgeon: Herbert Pun, MD;  Location: [facility] ORS;  Service: General;  Laterality: Left;   TUBAL LIGATION       reports that she has been smoking cigarettes. She has a 12.50 pack-year smoking history. She has never used smokeless tobacco. She reports that she does not drink alcohol and does not use drugs.  Allergies  Allergen Reactions   Naproxen Swelling, Rash and Other (See Comments)   Aspirin Swelling and Other (See Comments)   Belladonna Alkaloids Rash   Fluoxetine Hives and Rash   Fluoxetine Hcl Other (See Comments) and Rash   Paroxetine Hcl Rash and Other (See Comments)   Phenobarbital Rash and Other (See Comments)   Prozac [Fluoxetine Hcl] Rash    Family History  Problem Relation Age of Onset   Hypertension Mother    Heart failure Mother    Hypertension Brother    Stroke Brother    Hypertension Son    Emphysema Maternal Aunt    Hypertension Paternal Aunt    Hypertension Paternal Uncle    Hypertension Maternal Grandmother    Hypertension Paternal Grandmother       Prior to Admission medications   Medication Sig Start Date End Date Taking? Authorizing Provider  albuterol (VENTOLIN HFA) 108 (90 Base) MCG/ACT inhaler Inhale 2 puffs into the lungs every 6 (six) hours as needed for wheezing or shortness of breath. 05/14/21  Yes Earlie Server, MD  amiodarone (PACERONE) 200 MG tablet Take 1 tablet (200 mg total) by mouth daily. 07/15/21  Yes Gollan, Kathlene November, MD  apixaban (ELIQUIS) 5 MG TABS tablet Take 1 tablet (5 mg total) by mouth 2 (two) times daily. 11/25/20  Yes Gollan, Kathlene November, MD  chlorhexidine (PERIDEX) 0.12 % solution Use as directed 15 mLs in the mouth or throat 2 (two) times daily. 05/21/21  Yes Earlie Server, MD  docusate sodium (COLACE) 100 MG capsule Take 1 capsule (100 mg total) by mouth daily. 08/26/20  Yes Earlie Server,  MD  fentaNYL (DURAGESIC) 75 MCG/HR Place 1 patch onto the skin every 3 (three) days. 07/09/21  Yes Borders, Kirt Boys, NP  fluticasone-salmeterol (ADVAIR HFA) [phone] MCG/ACT inhaler Inhale 2 puffs into the lungs 2 (two) times daily. 06/04/21  Yes Earlie Server, MD  Melatonin 10 MG TABS Take 10 mg by mouth at bedtime as needed (sleep).   Yes [provider]  Multiple Vitamins-Minerals (CENTRUM SILVER PO) Take 1 tablet by mouth daily. Gummie   Yes [provider]  oxcarbazepine (TRILEPTAL) 600 MG tablet Take 1 tablet (600 mg total) by mouth 2 (two) times daily. 07/11/21  Yes Vaslow, Acey Lav, MD  Oxycodone HCl 10 MG TABS Take 1-2 tablets (10-20 mg total) by mouth every 4 (four) hours as needed. 07/15/21  Yes Borders, Kirt Boys, NP  polyethylene glycol powder (GLYCOLAX/MIRALAX) 17 GM/SCOOP powder Take 17 g by mouth daily as needed for mild constipation or moderate constipation. 09/11/20  Yes Mercy Riding, MD  potassium chloride  SA (KLOR-CON) 20 MEQ tablet Take 2 tablets (40 mEq total) by mouth daily. 05/21/21  Yes Earlie Server, MD  traZODone (DESYREL) 50 MG tablet Take 1-2 tablets (50-100 mg total) by mouth at bedtime as needed for sleep. 07/15/21  Yes Borders, Kirt Boys, NP  feeding supplement, ENSURE ENLIVE, (ENSURE ENLIVE) LIQD Take 237 mLs by mouth 3 (three) times daily between meals. 08/10/20   Fritzi Mandes, MD  furosemide (LASIX) 20 MG tablet Take 1 tablet (20 mg total) by mouth daily as needed (for lower extremity swelling). Patient not taking: No sig reported 09/20/20 12/19/20  Rise Mu, PA-C  gabapentin (NEURONTIN) 300 MG capsule Take 2 capsules (600 mg total) by mouth 3 (three) times daily. 07/09/21   Borders, Kirt Boys, NP  magnesium chloride (SLOW-MAG) 64 MG TBEC SR tablet Take 1 tablet (64 mg total) by mouth 2 (two) times daily. Patient not taking: Reported on 07/30/2021 10/07/20   Earlie Server, MD  ondansetron (ZOFRAN-ODT) 8 MG disintegrating tablet Take 1 tablet (8 mg total) by mouth every 8  (eight) hours as needed for nausea or vomiting. Patient not taking: No sig reported 10/14/20   Earlie Server, MD  prochlorperazine (COMPAZINE) 10 MG tablet Take 1 tablet (10 mg total) by mouth every 6 (six) hours as needed (Nausea or vomiting). 07/29/20 04/14/21  Earlie Server, MD    Physical Exam: Vitals:   07/30/21 1000 07/30/21 1015 07/30/21 1100 07/30/21 1107  BP: 130/75  130/69   Pulse: 70 60 65   Resp: 20 (!) 21 (!) 21 19  Temp:      TempSrc:      SpO2: 96% 96% 97% 98%     Vitals:   07/30/21 1000 07/30/21 1015 07/30/21 1100 07/30/21 1107  BP: 130/75  130/69   Pulse: 70 60 65   Resp: 20 (!) 21 (!) 21 19  Temp:      TempSrc:      SpO2: 96% 96% 97% 98%      Constitutional: Alert and oriented x 3 . Not in any apparent distress.  Chronically ill-appearing HEENT:      Head: Normocephalic and atraumatic.         Eyes: PERLA, EOMI, Conjunctivae injection involving the right eye. Sclera is non-icteric.       Mouth/Throat: Mucous membranes are moist.       Neck: Supple with no signs of meningismus. Cardiovascular: Regular rate and rhythm. No murmurs, gallops, or rubs. 2+ symmetrical distal pulses are present . No JVD. No LE edema Respiratory: Respiratory effort normal .rhonchi in both lung fields bilaterally. No wheezes or crackles.  Gastrointestinal: Soft, non tender, and non distended with positive bowel sounds.  Genitourinary: No CVA tenderness. Musculoskeletal: Nontender with normal range of motion in all extremities. No cyanosis, or erythema of extremities. Neurologic:  Face is symmetric. Moving all extremities. No gross focal neurologic deficits.  Generalized weakness Skin: Skin is warm, dry.  No rash or ulcers Psychiatric: Mood and affect are normal    Labs on Admission: I have personally reviewed following labs and imaging studies  CBC: Recent Labs  Lab 07/29/21 2030  WBC 10.2  HGB 10.6*  HCT 32.0*  MCV 91.7  PLT 010*   Basic Metabolic Panel: Recent Labs  Lab  07/29/21 2030 07/30/21 0551  NA 134*  --   K 3.2*  --   CL 93*  --   CO2 27  --   GLUCOSE 103*  --   BUN 11  --  CREATININE 0.46  --   CALCIUM 8.5*  --   MG  --  1.8   GFR: Estimated Creatinine Clearance: 59 mL/min (by C-G formula based on SCr of 0.46 mg/dL). Liver Function Tests: Recent Labs  Lab 07/29/21 2030  AST 21  ALT 21  ALKPHOS 216*  BILITOT 0.5  PROT 7.4  ALBUMIN 2.6*   No results for input(s): LIPASE, AMYLASE in the last 168 hours. No results for input(s): AMMONIA in the last 168 hours. Coagulation Profile: No results for input(s): INR, PROTIME in the last 168 hours. Cardiac Enzymes: No results for input(s): CKTOTAL, CKMB, CKMBINDEX, TROPONINI in the last 168 hours. BNP (last 3 results) No results for input(s): PROBNP in the last 8760 hours. HbA1C: No results for input(s): HGBA1C in the last 72 hours. CBG: No results for input(s): GLUCAP in the last 168 hours. Lipid Profile: No results for input(s): CHOL, HDL, LDLCALC, TRIG, CHOLHDL, LDLDIRECT in the last 72 hours. Thyroid Function Tests: No results for input(s): TSH, T4TOTAL, FREET4, T3FREE, THYROIDAB in the last 72 hours. Anemia Panel: No results for input(s): VITAMINB12, FOLATE, FERRITIN, TIBC, IRON, RETICCTPCT in the last 72 hours. Urine analysis:    Component Value Date/Time   COLORURINE AMBER (A) 07/30/2021 0057   APPEARANCEUR CLOUDY (A) 07/30/2021 0057   APPEARANCEUR Clear 12/21/2018 1041   LABSPEC 1.032 (H) 07/30/2021 0057   LABSPEC 1.017 04/23/2015 0000   PHURINE 6.0 07/30/2021 0057   GLUCOSEU NEGATIVE 07/30/2021 0057   HGBUR NEGATIVE 07/30/2021 0057   BILIRUBINUR SMALL (A) 07/30/2021 0057   BILIRUBINUR Negative 12/21/2018 1041   KETONESUR 20 (A) 07/30/2021 0057   PROTEINUR 100 (A) 07/30/2021 0057   NITRITE NEGATIVE 07/30/2021 0057   LEUKOCYTESUR SMALL (A) 07/30/2021 0057    Radiological Exams on Admission: CT CHEST ABDOMEN PELVIS W CONTRAST  Result Date: 07/30/2021 CLINICAL DATA:   65 year old female with history of multilobar pneumonia on chest x-ray and abdominal pain. 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 Possible sepsis. EXAM: CT CHEST, ABDOMEN, AND PELVIS WITH CONTRAST TECHNIQUE: Multidetector CT imaging of the chest, abdomen and pelvis was performed following the standard protocol during bolus administration of intravenous contrast. CONTRAST:  76mL OMNIPAQUE IOHEXOL 350 MG/ML SOLN COMPARISON:  PET-CT 07/08/2021. FINDINGS: CT CHEST FINDINGS Cardiovascular: Heart size is normal. There is no significant pericardial fluid, thickening or pericardial calcification. Aortic atherosclerosis. No definite coronary artery calcifications. Left internal jugular single-lumen porta cath with tip terminating in the distal superior vena cava. Mediastinum/Nodes: Multiple prominent but nonenlarged mediastinal lymph nodes are nonspecific. No definite pathologic lymphadenopathy identified in mediastinal or hilar nodal stations. Esophagus is unremarkable in appearance. No axillary lymphadenopathy. Lungs/Pleura: Worsening patchy multifocal areas of ground-glass attenuation, septal thickening and thickening of the peribronchovascular interstitium is seen throughout the lungs bilaterally with no discernible craniocaudal gradient. No confluent consolidative airspace disease. No pleural effusions. No definite suspicious appearing pulmonary nodules or masses are noted. Musculoskeletal: There are no aggressive appearing lytic or blastic lesions noted in the visualized portions of the skeleton. CT ABDOMEN PELVIS FINDINGS Hepatobiliary: No suspicious cystic or solid hepatic lesions. No intra or extrahepatic biliary ductal dilatation. Status post cholecystectomy. Pancreas: No pancreatic mass. No pancreatic ductal dilatation. No pancreatic or peripancreatic fluid collections or inflammatory changes. Spleen: Unremarkable. Adrenals/Urinary Tract: Bilateral kidneys and adrenal glands are normal in appearance. No  hydroureteronephrosis. Urinary bladder is normal in appearance. Stomach/Bowel: The appearance of the stomach is normal. There is no pathologic dilatation of small bowel or colon. Normal appendix. Vascular/Lymphatic: Aortic atherosclerosis,  without evidence of aneurysm or dissection in the abdominal or pelvic vasculature. No lymphadenopathy noted in the abdomen or pelvis. Reproductive: Status post hysterectomy. Ovaries are not confidently identified may be surgically absent or atrophic. Other: No significant volume of ascites.  No pneumoperitoneum. Musculoskeletal: Chronic compression fracture of anterior aspect of the superior endplate of L1, similar to the prior study from 05/01/2021 with minimal loss of anterior vertebral body height. There are no aggressive appearing lytic or blastic lesions noted in the visualized portions of the skeleton. IMPRESSION: 1. Marked worsening multifocal ground-glass attenuation and septal thickening in the lungs bilaterally when compared to prior PET-CT 07/08/2021. Findings are favored to reflect a drug-induced pneumonitis, but could alternatively reflect multilobar bilateral pneumonia from atypical infection. Further clinical evaluation is recommended. 2. No acute findings are noted in the abdomen or pelvis. 3. No definite signs of metastatic disease in the chest, abdomen or pelvis. 4. Aortic atherosclerosis. 5. Additional incidental findings, as above. Electronically Signed   By: Vinnie Langton M.D.   On: 07/30/2021 06:53   DG Chest Port 1 View  Result Date: 07/29/2021 CLINICAL DATA:  65 year old female with history of cough and weakness for the past several days. EXAM: PORTABLE CHEST 1 VIEW COMPARISON:  Chest x-ray 12/30/2020. FINDINGS: Left-sided internal jugular single-lumen porta cath with tip terminating in the distal superior vena cava. Lung volumes are normal. Diffuse interstitial prominence and peribronchial cuffing. No confluent consolidative airspace disease. No  pleural effusions. No pneumothorax. No pulmonary nodule or mass noted. Pulmonary vasculature and the cardiomediastinal silhouette are within normal limits. Atherosclerotic calcifications in the thoracic aorta. IMPRESSION: 1. The appearance the chest suggests severe bronchitis, potentially with developing multilobar bronchopneumonia. 2. Aortic atherosclerosis. Electronically Signed   By: Vinnie Langton M.D.   On: 07/29/2021 19:39     Assessment/Plan Principal Problem:   Multifocal pneumonia Active Problems:   History of atrial fibrillation   Head and neck cancer ([place])   Neoplasm related pain   Acute respiratory failure [facility])    Patient is a 65 year old female with a history of head and neck cancer who presents to the ER for evaluation of generalized weakness and is found to have multifocal pneumonia   Multifocal pneumonia Patient with a history of head and neck cancer who presents for evaluation of generalized weakness. Patient noted to have multifocal pneumonia We will keep patient n.p.o. Speech therapy consult for swallow function evaluation Start patient empirically on Rocephin and Zithromax Follow-up results of blood cultures     Acute respiratory failure Secondary to multifocal pneumonia Patient had room air pulse oximetry of 87% and required 2 L of oxygen to improve oxygenation to 93% Will attempt to wean off oxygen once her acute illness is improved    History of paroxysmal A. Fib Continue amiodarone for rate control Continue apixaban as primary prophylaxis for an acute stroke    History of head and neck cancer with neoplasm related pain Continue fentanyl patch and oxycodone Follow-up with oncology as an outpatient   DVT prophylaxis: Apixaban Code Status: full code  Family Communication: Greater than 50% of time was spent discussing patient's condition and plan of care with her at the bedside.  All questions and concerns have been addressed.  She verbalizes  understanding and agrees with the plan. Patient lists her friend Alda Berthold as her healthcare power of attorney Disposition Plan: Back to previous home environment Consults called: Oncology Status:At the time of admission, it appears that the appropriate admission status for this patient  is inpatient. This is judged to be reasonable and necessary to provide the required intensity of service to ensure the patient's safety given the presenting symptoms, physical exam findings, and initial radiographic and laboratory data in the context of their comorbid conditions. Patient requires inpatient status due to high intensity of service, high risk for further deterioration and high frequency of surveillance required.     Collier Bullock MD [facility]     07/30/2021, 11:56 AM

## 2021-07-30 NOTE — Progress Notes (Signed)
Cross Cover Patient with increasing oxygen needs, now requiring oxygen via high flow nasal cannula.  IV fluids stopped.

## 2021-07-30 NOTE — ED Notes (Signed)
Pt placed on 7L high flow O2.

## 2021-07-30 NOTE — Progress Notes (Signed)
PHARMACY -  BRIEF ANTIBIOTIC NOTE   Pharmacy has received consult(s) for Vancomycin from an ED provider.  The patient's profile has been reviewed for ht/wt/allergies/indication/available labs.    One time order(s) placed for Vancomycin 1250 mg.  Further antibiotics/pharmacy consults should be ordered by admitting physician if indicated.                       Thank you, Kailei Cowens D 07/30/2021  5:57 AM

## 2021-07-31 ENCOUNTER — Encounter: Payer: Self-pay | Admitting: Internal Medicine

## 2021-07-31 DIAGNOSIS — G893 Neoplasm related pain (acute) (chronic): Secondary | ICD-10-CM

## 2021-07-31 DIAGNOSIS — D849 Immunodeficiency, unspecified: Secondary | ICD-10-CM

## 2021-07-31 DIAGNOSIS — C760 Malignant neoplasm of head, face and neck: Secondary | ICD-10-CM | POA: Diagnosis not present

## 2021-07-31 DIAGNOSIS — Z8679 Personal history of other diseases of the circulatory system: Secondary | ICD-10-CM

## 2021-07-31 DIAGNOSIS — J9601 Acute respiratory failure with hypoxia: Secondary | ICD-10-CM

## 2021-07-31 DIAGNOSIS — R531 Weakness: Secondary | ICD-10-CM | POA: Diagnosis not present

## 2021-07-31 DIAGNOSIS — J189 Pneumonia, unspecified organism: Secondary | ICD-10-CM

## 2021-07-31 LAB — HIV ANTIBODY (ROUTINE TESTING W REFLEX): HIV Screen 4th Generation wRfx: NONREACTIVE

## 2021-07-31 LAB — CBC
HCT: 27.5 % — ABNORMAL LOW (ref 36.0–46.0)
Hemoglobin: 8.7 g/dL — ABNORMAL LOW (ref 12.0–15.0)
MCH: 29.2 pg (ref 26.0–34.0)
MCHC: 31.6 g/dL (ref 30.0–36.0)
MCV: 92.3 fL (ref 80.0–100.0)
Platelets: 494 10*3/uL — ABNORMAL HIGH (ref 150–400)
RBC: 2.98 MIL/uL — ABNORMAL LOW (ref 3.87–5.11)
RDW: 16.5 % — ABNORMAL HIGH (ref 11.5–15.5)
WBC: 7.9 10*3/uL (ref 4.0–10.5)
nRBC: 0 % (ref 0.0–0.2)

## 2021-07-31 LAB — BASIC METABOLIC PANEL
Anion gap: 12 (ref 5–15)
BUN: 11 mg/dL (ref 8–23)
CO2: 26 mmol/L (ref 22–32)
Calcium: 8 mg/dL — ABNORMAL LOW (ref 8.9–10.3)
Chloride: 99 mmol/L (ref 98–111)
Creatinine, Ser: 0.4 mg/dL — ABNORMAL LOW (ref 0.44–1.00)
GFR, Estimated: >60 mL/min (ref >60)
Glucose, Bld: 77 mg/dL (ref 70–99)
Potassium: 3 mmol/L — ABNORMAL LOW (ref 3.5–5.1)
Sodium: 137 mmol/L (ref 135–145)

## 2021-07-31 LAB — URINE CULTURE: Culture: <10000 — AB

## 2021-07-31 MED ORDER — INFLUENZA VAC A&B SA ADJ QUAD 0.5 ML IM PRSY
0.5000 mL | PREFILLED_SYRINGE | INTRAMUSCULAR | Status: DC
  Filled 2021-07-31: qty 0.5

## 2021-07-31 MED ORDER — TRAZODONE HCL 50 MG PO TABS
50.0000 mg | ORAL_TABLET | Freq: Every evening | ORAL | Status: DC | PRN
Start: 2021-07-31 — End: 2021-08-08
  Administered 2021-08-01 – 2021-08-07 (×6): 50 mg via ORAL
  Filled 2021-07-31 (×6): qty 1

## 2021-07-31 MED ORDER — POTASSIUM CHLORIDE IN NACL 40-0.9 MEQ/L-% IV SOLN
INTRAVENOUS | Status: DC
  Filled 2021-07-31 (×8): qty 1000

## 2021-07-31 MED ORDER — METHYLPREDNISOLONE SODIUM SUCC 125 MG IJ SOLR
80.0000 mg | Freq: Two times a day (BID) | INTRAMUSCULAR | Status: DC
  Administered 2021-07-31 – 2021-08-01 (×3): 80 mg via INTRAVENOUS
  Filled 2021-07-31 (×3): qty 2

## 2021-07-31 MED ORDER — OXYCODONE HCL 5 MG PO TABS
10.0000 mg | ORAL_TABLET | ORAL | Status: DC | PRN
  Administered 2021-08-02: 12:00:00 20 mg via ORAL
  Administered 2021-08-03 – 2021-08-04 (×2): 10 mg via ORAL
  Filled 2021-07-31: qty 2
  Filled 2021-07-31: qty 4
  Filled 2021-07-31 (×2): qty 2

## 2021-07-31 MED ORDER — ACETAMINOPHEN 325 MG RE SUPP
650.0000 mg | RECTAL | Status: DC | PRN
  Administered 2021-07-31: 650 mg via RECTAL
  Filled 2021-07-31 (×3): qty 2

## 2021-07-31 MED ORDER — CHLORHEXIDINE GLUCONATE CLOTH 2 % EX PADS
6.0000 | MEDICATED_PAD | Freq: Every day | CUTANEOUS | Status: DC
  Administered 2021-08-01 – 2021-08-08 (×8): 6 via TOPICAL

## 2021-07-31 MED ORDER — POTASSIUM CHLORIDE 10 MEQ/100ML IV SOLN
10.0000 meq | INTRAVENOUS | Status: AC
  Administered 2021-07-31 (×2): 10 meq via INTRAVENOUS
  Filled 2021-07-31: qty 100

## 2021-07-31 MED ORDER — SODIUM CHLORIDE 0.9% FLUSH
10.0000 mL | Freq: Two times a day (BID) | INTRAVENOUS | Status: DC
Start: 2021-07-31 — End: 2021-08-08
  Administered 2021-07-31 – 2021-08-01 (×3): 10 mL
  Administered 2021-08-01: 20 mL
  Administered 2021-08-02 – 2021-08-08 (×13): 10 mL

## 2021-07-31 MED ORDER — SODIUM CHLORIDE 0.9% FLUSH: 10.0000 mL | INTRAVENOUS | Status: DC | PRN

## 2021-07-31 MED ORDER — METOPROLOL TARTRATE 25 MG PO TABS
25.0000 mg | ORAL_TABLET | Freq: Two times a day (BID) | ORAL | Status: DC
  Administered 2021-08-01 – 2021-08-05 (×8): 25 mg via ORAL
  Filled 2021-07-31 (×9): qty 1

## 2021-07-31 NOTE — Progress Notes (Signed)
PROGRESS NOTE    Shannon Schroeder   IPJ:[id number]  DOB: Mar 29, 1956  DOA: 07/30/2021 PCP: Merryl Hacker, No   Brief Narrative:  Shannon Britain Ainsworthis a 65 y.o. 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  She was diagnosed with head and neck cancer in 2021 and is currently receiving Keytruda and Cetuximab.   She presents to the hospital for complaints of nausea dry heaves which started after her last dose of cancer treatment on 07/23/2021.  She admits to shortness of breath with exertion. Chest x-ray reveals diffuse interstitial prominence and peribronchial cuffing. CT chest abdomen pelvis with contrast reveals: Multifocal groundglass attenuation and septal thickening in the lungs bilaterally which are worse when compared to PET/CT on 07/08/2021 - No definite signs of metastatic disease in the chest abdomen pelvis  In the ED, she was started on antibiotics: Vancomycin, azithromycin and cefepime   Subjective: Patient has a severely dry mouth and is asking to drink water.    Assessment & Plan:   Principal Problem:   Acute respiratory failure with hypoxemia (HCC) -Currently receiving ceftriaxone and azithromycin -May be related to aspiration however she is on 3 medications that can cause pneumonitis including Keytruda, cetuximab and Amiodarone -It is to be noted that the patient has not had any fevers, sweats or chills and does not have any leukocytosis -Currently on 6 to 7 L of high flow nasal cannula -I have held her amiodarone dose this morning -Speech therapy recommending keeping her n.p.o. until modified barium swallow can be done tomorrow - I will start IV fluids to maintain hydration while she is n.p.o. -Consult palliative care  Active Problems: COPD  nicotine abuse - Likely not having an exacerbation of COPD - Continue nebulizer treatments  Nausea and vomiting - Status post treatment for cancer-currently appears to have abated  Hypokalemia - We will replace via IV route  Right sided Maxillary cancer, squamous cell invasive cancer Generalized weakness with weight loss -She initially presented with a large right facial mass eroding into the maxillary area and multiple branches of the maxillary division of the right trigeminal nerve - Status post multiple cycles of chemotherapy-developed hypersensitivity to carboplatin in 5/22 -Receiving treatment by Dr. 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   Difficult to place patient No      Consultants:  Oncology Palliative care Procedures:  none Antimicrobials:  Anti-infectives (From admission, onward)  Start     Dose/Rate Route Frequency Ordered Stop   07/30/21 1800  cefTRIAXone (ROCEPHIN) 2 g in sodium chloride 0.9 % 100 mL IVPB        2 g 200 mL/hr over 30 Minutes Intravenous Daily-1800 07/30/21 1126 08/04/21 1759   07/30/21 1145  azithromycin  (ZITHROMAX) 500 mg in sodium chloride 0.9 % 250 mL IVPB        500 mg 250 mL/hr over 60 Minutes Intravenous Daily 07/30/21 1126 08/04/21 0959   07/30/21 0600  vancomycin (VANCOREADY) IVPB 1250 mg/250 mL        1,250 mg 166.7 mL/hr over 90 Minutes Intravenous  Once 07/30/21 0557 07/30/21 1019   07/30/21 0545  ceFEPIme (MAXIPIME) 1 g in sodium chloride 0.9 % 100 mL IVPB        1 g 200 mL/hr over 30 Minutes Intravenous  Once 07/30/21 0540 07/30/21 0753        Objective: Vitals:   07/31/21 0436 07/31/21 0726 07/31/21 0928 07/31/21 0930  BP:    134/76  Pulse:   74 72  Resp:    20  Temp:    97.9 F (36.6 C)  TempSrc:      SpO2: 96% 95% 99% 97%  Weight:      Height:        Intake/Output Summary (Last 24 hours) at 07/31/2021 1048 Last data filed at 07/30/2021 2307 Gross per 24 hour  Intake 891.92 ml  Output --  Net 891.92 ml   Filed Weights   07/31/21 0313  Weight: 57.3 kg    Examination: General exam: Appears comfortable  HEENT: PERRL-abnormal anatomy of the right side of the face - speech is thick and difficult to comprehend-mouth is quite dry Respiratory system: b/l crackles -on 7 L of nasal cannula Cardiovascular system: S1 & S2 heard, RRR.   Gastrointestinal system: Abdomen soft, non-tender, nondistended. Normal bowel sounds. Central nervous system: Alert and oriented. No focal neurological deficits. Extremities: No cyanosis, clubbing or edema Skin: No rashes or ulcers Psychiatry:  Mood & affect appropriate.     Data Reviewed: I have personally reviewed following labs and imaging studies  CBC: Recent Labs  Lab 07/29/21 2030 07/31/21 0600  WBC 10.2 7.9  HGB 10.6* 8.7*  HCT 32.0* 27.5*  MCV 91.7 92.3  PLT 512* 154*   Basic Metabolic Panel: Recent Labs  Lab 07/29/21 2030 07/30/21 0551 07/31/21 0600  NA 134*  --  137  K 3.2*  --  3.0*  CL 93*  --  99  CO2 27  --  26  GLUCOSE 103*  --  77  BUN 11  --  11  CREATININE 0.46  --  0.40*  CALCIUM 8.5*   --  8.0*  MG  --  1.8  --    GFR: Estimated Creatinine Clearance: 62.6 mL/min (A) (by C-G formula based on SCr of 0.4 mg/dL (L)). Liver Function Tests: Recent Labs  Lab 07/29/21 2030  AST 21  ALT 21  ALKPHOS 216*  BILITOT 0.5  PROT 7.4  ALBUMIN 2.6*   No results for input(s): LIPASE, AMYLASE in the last 168 hours. No results for input(s): AMMONIA in the last 168 hours. Coagulation Profile: No results for input(s): INR, PROTIME in the last 168 hours. Cardiac Enzymes: No results for input(s): CKTOTAL, CKMB, CKMBINDEX, TROPONINI in the last 168 hours. BNP (last 3 results) No results for input(s): PROBNP in the last 8760 hours. HbA1C: No results for input(s): HGBA1C in the last  72 hours. CBG: No results for input(s): GLUCAP in the last 168 hours. Lipid Profile: No results for input(s): CHOL, HDL, LDLCALC, TRIG, CHOLHDL, LDLDIRECT in the last 72 hours. Thyroid Function Tests: No results for input(s): TSH, T4TOTAL, FREET4, T3FREE, THYROIDAB in the last 72 hours. Anemia Panel: No results for input(s): VITAMINB12, FOLATE, FERRITIN, TIBC, IRON, RETICCTPCT in the last 72 hours. Urine analysis:    Component Value Date/Time   COLORURINE AMBER (A) 07/30/2021 0057   APPEARANCEUR CLOUDY (A) 07/30/2021 0057   APPEARANCEUR Clear 12/21/2018 1041   LABSPEC 1.032 (H) 07/30/2021 0057   LABSPEC 1.017 04/23/2015 0000   PHURINE 6.0 07/30/2021 0057   GLUCOSEU NEGATIVE 07/30/2021 0057   HGBUR NEGATIVE 07/30/2021 0057   BILIRUBINUR SMALL (A) 07/30/2021 0057   BILIRUBINUR Negative 12/21/2018 1041   KETONESUR 20 (A) 07/30/2021 0057   PROTEINUR 100 (A) 07/30/2021 0057   NITRITE NEGATIVE 07/30/2021 0057   LEUKOCYTESUR SMALL (A) 07/30/2021 0057   Sepsis Labs: @LABRCNTIP (procalcitonin:4,lacticidven:4) ) Recent Results (from the past 240 hour(s))  Blood Culture (routine x 2)     Status: None (Preliminary result)   Collection Time: 07/30/21  5:51 AM   Specimen: BLOOD  Result Value Ref Range  Status   Specimen Description BLOOD LEFT HAND  Final   Special Requests   Final    BOTTLES DRAWN AEROBIC AND ANAEROBIC Blood Culture adequate volume   Culture   Final    NO GROWTH 1 DAY Performed at [facility], [address] [place], [place] [address]    Report Status PENDING  Incomplete  Blood Culture (routine x 2)     Status: None (Preliminary result)   Collection Time: 07/30/21  5:51 AM   Specimen: BLOOD  Result Value Ref Range Status   Specimen Description BLOOD RIGHT FOREARM  Final   Special Requests   Final    BOTTLES DRAWN AEROBIC AND ANAEROBIC Blood Culture adequate volume   Culture   Final    NO GROWTH 1 DAY Performed at [facility], [address], [place], [place] [address]    Report Status PENDING  Incomplete  Urine Culture     Status: Abnormal   Collection Time: 07/30/21  5:51 AM   Specimen: Urine, Random  Result Value Ref Range Status   Specimen Description   Final    URINE, RANDOM Performed at [facility], [address] [place], [place] [address]    Special Requests   Final    NONE Performed at [facility], [address], [place], [place] [address]    Culture (A)  Final    <10,000 COLONIES/mL INSIGNIFICANT GROWTH Performed at [facility], [place] [address] [place], [place] [address]    Report Status 07/31/2021 FINAL  Final  Resp Panel by RT-PCR (Flu A&B, Covid) Nasopharyngeal Swab     Status: None   Collection Time: 07/30/21  5:51 AM   Specimen: Nasopharyngeal Swab; Nasopharyngeal(NP) swabs in vial transport medium  Result Value Ref Range Status   SARS Coronavirus 2 by RT PCR NEGATIVE NEGATIVE Final    Comment: (NOTE) SARS-CoV-2 target nucleic acids are NOT DETECTED.  The SARS-CoV-2 RNA is generally detectable in upper respiratory specimens during the acute phase of infection. The lowest concentration of SARS-CoV-2 viral copies this assay can detect is 138 copies/mL. A negative result does not  preclude SARS-Cov-2 infection and should not be used as the sole basis for treatment or other patient management decisions. A negative result may occur  with  improper specimen collection/handling, submission of specimen other than nasopharyngeal swab, presence of viral mutation(s) within the areas targeted by this assay, and inadequate number of viral copies(<138 copies/mL). A negative result must be combined with clinical observations, patient history, and epidemiological information. The expected result is Negative.  Fact Sheet for Patients:  EntrepreneurPulse.com.au  Fact Sheet for Healthcare Providers:  IncredibleEmployment.be  This test is no t yet approved or cleared by the [place] FDA and  has been authorized for detection and/or diagnosis of SARS-CoV-2 by FDA under an Emergency Use Authorization (EUA). This EUA will remain  in effect (meaning this test can be used) for the duration of the COVID-19 declaration under Section 564(b)(1) of the Act, 21 U.S.C.section 360bbb-3(b)(1), unless the authorization is terminated  or revoked sooner.       Influenza A by PCR NEGATIVE NEGATIVE Final   Influenza B by PCR NEGATIVE NEGATIVE Final    Comment: (NOTE) The Xpert Xpress SARS-CoV-2/FLU/RSV plus assay is intended as an aid in the diagnosis of influenza from Nasopharyngeal swab specimens and should not be used as a sole basis for treatment. Nasal washings and aspirates are unacceptable for Xpert Xpress SARS-CoV-2/FLU/RSV testing.  Fact Sheet for Patients: EntrepreneurPulse.com.au  Fact Sheet for Healthcare Providers: IncredibleEmployment.be  This test is not yet approved or cleared by the [place] FDA and has been authorized for detection and/or diagnosis of SARS-CoV-2 by FDA under an Emergency Use Authorization (EUA). This EUA will remain in effect (meaning this test can be used) for the  duration of the COVID-19 declaration under Section 564(b)(1) of the Act, 21 U.S.C. section 360bbb-3(b)(1), unless the authorization is terminated or revoked.  Performed at [facility], [address] [place], [place] [address]          Radiology Studies: CT CHEST ABDOMEN PELVIS W CONTRAST  Result Date: 07/30/2021 CLINICAL DATA:  65 year old female with history of multilobar pneumonia on chest x-ray and abdominal pain. 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 Possible sepsis. EXAM: CT CHEST, ABDOMEN, AND PELVIS WITH CONTRAST TECHNIQUE: Multidetector CT imaging of the chest, abdomen and pelvis was performed following the standard protocol during bolus administration of intravenous contrast. CONTRAST:  80mL OMNIPAQUE IOHEXOL 350 MG/ML SOLN COMPARISON:  PET-CT 07/08/2021. FINDINGS: CT CHEST FINDINGS Cardiovascular: Heart size is normal. There is no significant pericardial fluid, thickening or pericardial calcification. Aortic atherosclerosis. No definite coronary artery calcifications. Left internal jugular single-lumen porta cath with tip terminating in the distal superior vena cava. Mediastinum/Nodes: Multiple prominent but nonenlarged mediastinal lymph nodes are nonspecific. No definite pathologic lymphadenopathy identified in mediastinal or hilar nodal stations. Esophagus is unremarkable in appearance. No axillary lymphadenopathy. Lungs/Pleura: Worsening patchy multifocal areas of ground-glass attenuation, septal thickening and thickening of the peribronchovascular interstitium is seen throughout the lungs bilaterally with no discernible craniocaudal gradient. No confluent consolidative airspace disease. No pleural effusions. No definite suspicious appearing pulmonary nodules or masses are noted. Musculoskeletal: There are no aggressive appearing lytic or blastic lesions noted in the visualized portions of the skeleton. CT ABDOMEN PELVIS FINDINGS Hepatobiliary: No suspicious cystic  or solid hepatic lesions. No intra or extrahepatic biliary ductal dilatation. Status post cholecystectomy. Pancreas: No pancreatic mass. No pancreatic ductal dilatation. No pancreatic or peripancreatic fluid collections or inflammatory changes. Spleen: Unremarkable. Adrenals/Urinary Tract: Bilateral kidneys and adrenal glands are normal in appearance. No hydroureteronephrosis. Urinary bladder is normal in appearance. Stomach/Bowel: The appearance of the stomach is normal. There is no pathologic dilatation of small bowel or colon.  Normal appendix. Vascular/Lymphatic: Aortic atherosclerosis, without evidence of aneurysm or dissection in the abdominal or pelvic vasculature. No lymphadenopathy noted in the abdomen or pelvis. Reproductive: Status post hysterectomy. Ovaries are not confidently identified may be surgically absent or atrophic. Other: No significant volume of ascites.  No pneumoperitoneum. Musculoskeletal: Chronic compression fracture of anterior aspect of the superior endplate of L1, similar to the prior study from 05/01/2021 with minimal loss of anterior vertebral body height. There are no aggressive appearing lytic or blastic lesions noted in the visualized portions of the skeleton. IMPRESSION: 1. Marked worsening multifocal ground-glass attenuation and septal thickening in the lungs bilaterally when compared to prior PET-CT 07/08/2021. Findings are favored to reflect a drug-induced pneumonitis, but could alternatively reflect multilobar bilateral pneumonia from atypical infection. Further clinical evaluation is recommended. 2. No acute findings are noted in the abdomen or pelvis. 3. No definite signs of metastatic disease in the chest, abdomen or pelvis. 4. Aortic atherosclerosis. 5. Additional incidental findings, as above. Electronically Signed   By: Vinnie Langton M.D.   On: 07/30/2021 06:53   DG Chest Port 1 View  Result Date: 07/29/2021 CLINICAL DATA:  65 year old female with history of cough  and weakness for the past several days. EXAM: PORTABLE CHEST 1 VIEW COMPARISON:  Chest x-ray 12/30/2020. FINDINGS: Left-sided internal jugular single-lumen porta cath with tip terminating in the distal superior vena cava. Lung volumes are normal. Diffuse interstitial prominence and peribronchial cuffing. No confluent consolidative airspace disease. No pleural effusions. No pneumothorax. No pulmonary nodule or mass noted. Pulmonary vasculature and the cardiomediastinal silhouette are within normal limits. Atherosclerotic calcifications in the thoracic aorta. IMPRESSION: 1. The appearance the chest suggests severe bronchitis, potentially with developing multilobar bronchopneumonia. 2. Aortic atherosclerosis. Electronically Signed   By: Vinnie Langton M.D.   On: 07/29/2021 19:39      Scheduled Meds:  apixaban  5 mg Oral BID   docusate sodium  100 mg Oral Daily   feeding supplement  237 mL Oral TID BM   fentaNYL  1 patch Transdermal Q72H   gabapentin  600 mg Oral TID   [START ON 08/01/2021] influenza vaccine adjuvanted  0.5 mL Intramuscular Tomorrow-1000   mometasone-formoterol  2 puff Inhalation BID   multivitamin with minerals  1 tablet Oral Daily   oxcarbazepine  600 mg Oral BID   potassium chloride SA  40 mEq Oral Daily   Continuous Infusions:  azithromycin 500 mg (07/31/21 0848)   cefTRIAXone (ROCEPHIN)  IV Stopped (07/30/21 1841)     LOS: 1 day      Debbe Odea, MD [facility] Pager: [email or web address] 07/31/2021, 10:48 AM

## 2021-07-31 NOTE — Progress Notes (Signed)
SLP Cancellation Note  Patient Details Name: Shannon Schroeder MRN: [id number] DOB: [birth year]   Cancelled treatment:       Reason Eval/Treat Not Completed: Patient not medically ready;Medical issues which prohibited therapy (chart reviewed; consulted NSG and MD). Reviewed Imaging in chart -- "Multifocal groundglass attenuation and septal thickening in the lungs bilaterally which are worse when compared to PET/CT on 07/08/2021"; noted multifocal pneumonia and H&N Cancer (dx'd in 2021). Last tx was 07/23/2021. Pt reports dysphagia at home. Recommend NPO status d/t risk for aspiration at this time. Recommend objective swallowing assessment to determine degree of dysphagia and least restrictive oral diet consistency. Recommend frequent oral care for hygiene and stimulation of swallowing. MD and NSG made aware and agreed. ST services will f/u in the morning for scheduled MBSS. Pt updated as well.      Orinda Kenner, MS, CCC-SLP Speech Language Pathologist Rehab Services [phone] [facility] 07/31/2021, 2:38 PM

## 2021-07-31 NOTE — Consult Note (Signed)
Hematology/Oncology Consult note [facility] [phone] [phone]  Patient Care Team: Pcp, No as PCP - General Rockey Situ Kathlene November, MD as PCP - Cardiology (Cardiology) Earlie Server, MD as Consulting Physician (Oncology)   Name of the patient: Shannon Schroeder  [id number]  10/09/1956   Date of visit: 07/31/21 REASON FOR COSULTATION:  Multifocal pneumonia, head and neck cancer History of presenting illness-  65 y.o. female with PMH listed at below who presents to ER for evaluation of increased weakness. No fever or chills.  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  She developed dry heaves and nausea/vomiting since last week's treatment.  Chronic dysphagia for solid food. More SOB than her baseline.   In ER, Respiratory viral panel is negative. CXR showed severe bronchitis.  CT chest abdomen pelvis showed Marked worsening multifocal ground-glass attenuation and septal thickening in the lungs bilaterally when compared to prior PET-CT 07/08/2021. Findings are favored to reflect a drug-induced pneumonitis, but could alternatively reflect multilobar bilateral pneumonia from atypical infection. Further clinical evaluation is recommended. No acute findings are noted in the abdomen or pelvis. No definite signs of metastatic disease in the chest, abdomen or pelvis. Aortic atherosclerosis.  She is admitted for treatment of multifocal pneumonia.   Review of Systems  Constitutional:  Positive for fatigue. Negative for appetite change, chills and fever.  HENT:   Negative for hearing loss and voice change.   Eyes:  Negative for eye problems.  Respiratory:  Positive for shortness of breath. Negative for chest tightness and cough.   Cardiovascular:  Negative for chest pain.  Gastrointestinal:  Positive for nausea and vomiting. Negative for abdominal distention, abdominal pain and blood in stool.  Endocrine: Negative for hot  flashes.  Genitourinary:  Negative for difficulty urinating and frequency.   Musculoskeletal:  Negative for arthralgias.  Skin:  Negative for itching and rash.  Neurological:  Negative for extremity weakness.  Hematological:  Negative for adenopathy.  Psychiatric/Behavioral:  Negative for confusion.    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   Encounter for antineoplastic immunotherapy 04/23/2021   Moderate protein-calorie malnutrition ([place]) 04/14/2021   Dehydration 10/14/2020   SVT (supraventricular tachycardia) ([place])    Hypomagnesemia    Dysphagia    Severe sepsis ([place]) 09/06/2020   Multifocal pneumonia 09/06/2020   Drug-induced constipation 08/26/2020   Hypotension 08/12/2020   Encounter for antineoplastic chemotherapy 08/08/2020   Atrial fibrillation with RVR ([place]) 08/08/2020   Elevated troponin 08/08/2020   Leukocytosis 08/08/2020   Hypokalemia 08/08/2020   SIRS (systemic inflammatory response syndrome) ([place]) 08/08/2020   Goals of care, counseling/discussion 08/06/2020   Head and neck cancer ([place]) 07/29/2020   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   Weight loss 07/29/2020   Neoplasm related pain 07/29/2020   Abscess of upper gum 09/12/2019   History of atrial fibrillation 03/22/2019   Bipolar affective disorder, current episode mixed ([place]) 06/29/2018   Cerebral edema ([place]) 06/25/2018   Hypertensive emergency 06/16/2018  Subarachnoid bleed ([place]) 06/16/2018   TIA (transient ischemic attack)  06/15/2018   Tobacco abuse 06/02/2018   COPD (chronic obstructive pulmonary disease) ([place]) 04/21/2018   URI (upper respiratory infection) 03/31/2018   Hypertension 03/31/2018   Tachycardia 02/02/2018   Rash of body 02/02/2018   Hyponatremia 02/02/2018     Past Medical History:  Diagnosis Date   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    Anxiety    Arthritis    Bipolar disorder (HCC)    COPD (chronic obstructive pulmonary disease) (HCC)    NO INHALERS   Depression    Dysrhythmia    H/O V TACH   Head and neck cancer ([place]) 07/29/2020   Headache    H/O MIGRAINES   Hypertension    Seizures ([place])    X1 AFTER FALL     Past Surgical History:  Procedure Laterality Date   ABDOMINAL HYSTERECTOMY     partial   BREAST SURGERY     biopsy   CARDIAC CATHETERIZATION     X 2   CARPAL TUNNEL RELEASE Right    CARPECTOMY HAND Right    CATARACT EXTRACTION W/PHACO Left 03/08/2018   Procedure: CATARACT EXTRACTION PHACO AND INTRAOCULAR LENS PLACEMENT ([place]);  Surgeon: Birder Robson, MD;  Location: [facility] ORS;  Service: Ophthalmology;  Laterality: Left;  [place] 00:55 AP% 13.5 CDE 7.52 Fluid pack lot # [id number]   CATARACT EXTRACTION W/PHACO Right 04/05/2018   Procedure: CATARACT EXTRACTION PHACO AND INTRAOCULAR LENS PLACEMENT (IOC);  Surgeon: Birder Robson, MD;  Location: [facility] ORS;  Service: Ophthalmology;  Laterality: Right;  [place] 00:36 AP% 15.9 CDE 5.72 Fluid pack lot # [id number]   CEREBRAL ANEURYSM REPAIR     CHOLECYSTECTOMY     COLONOSCOPY WITH PROPOFOL N/A 04/26/2019   Procedure: COLONOSCOPY WITH PROPOFOL;  Surgeon: Jonathon Bellows, MD;  Location: [facility] ENDOSCOPY;  Service: Gastroenterology;  Laterality: N/A;   PORTACATH PLACEMENT Left 08/05/2020   Procedure: INSERTION PORT-A-CATH;  Surgeon: Herbert Pun, MD;  Location: [facility] ORS;  Service: General;  Laterality: Left;   TUBAL LIGATION      Social History   Socioeconomic History    Marital status: Divorced    Spouse name: Not on file   Number of children: Not on file   Years of education: 16   Highest education level: Bachelor's degree (e.g., BA, AB, BS)  Occupational History   Occupation: farmer  Tobacco Use   Smoking status: Every Day    Packs/day: 0.25    Years: 50.00    Pack years: 12.50    Types: Cigarettes   Smokeless tobacco: Never  Vaping Use   Vaping Use: Never used  Substance and Sexual Activity   Alcohol use: No   Drug use: No   Sexual activity: Not Currently  Other Topics Concern   Not on file  Social History Narrative   Pt [facility] at [facility].    Social Determinants of Health   Financial Resource Strain: Not on file  Food Insecurity: Not on file  Transportation Needs: Not on file  Physical Activity: Not on file  Stress: Not on file  Social Connections: Not on file  Intimate Partner Violence: Not on file     Family History  Problem Relation Age of Onset   Hypertension Mother    Heart failure Mother    Hypertension Brother    Stroke Brother    Hypertension Son    Emphysema Maternal Aunt    Hypertension  Paternal Aunt    Hypertension Paternal Uncle    Hypertension Maternal Grandmother    Hypertension Paternal Grandmother      Current Facility-Administered Medications:    apixaban (ELIQUIS) tablet 5 mg, 5 mg, Oral, BID, Agbata, Tochukwu, MD, 5 mg at 07/30/21 2125   azithromycin (ZITHROMAX) 500 mg in sodium chloride 0.9 % 250 mL IVPB, 500 mg, Intravenous, Daily, Agbata, Tochukwu, MD, Stopped at 07/30/21 1322   cefTRIAXone (ROCEPHIN) 2 g in sodium chloride 0.9 % 100 mL IVPB, 2 g, Intravenous, q1800, Agbata, Tochukwu, MD, Stopped at 07/30/21 1841   docusate sodium (COLACE) capsule 100 mg, 100 mg, Oral, Daily, Agbata, Tochukwu, MD, 100 mg at 07/30/21 1217   feeding supplement (ENSURE ENLIVE / ENSURE PLUS) liquid 237 mL, 237 mL, Oral, TID BM, Agbata, Tochukwu, MD, 237 mL at 07/30/21 1227   fentaNYL (DURAGESIC) 75 MCG/HR 1  patch, 1 patch, Transdermal, Q72H, Agbata, Tochukwu, MD   gabapentin (NEURONTIN) capsule 600 mg, 600 mg, Oral, TID, Agbata, Tochukwu, MD, 600 mg at 07/30/21 2126   [START ON 08/01/2021] influenza vaccine adjuvanted (FLUAD) injection 0.5 mL, 0.5 mL, Intramuscular, Tomorrow-1000, Agbata, Tochukwu, MD   ipratropium-albuterol (DUONEB) 0.5-2.5 (3) MG/3ML nebulizer solution 3 mL, 3 mL, Nebulization, Q6H PRN, Agbata, Tochukwu, MD   melatonin tablet 10 mg, 10 mg, Oral, QHS PRN, Agbata, Tochukwu, MD   mometasone-formoterol (DULERA) 100-5 MCG/ACT inhaler 2 puff, 2 puff, Inhalation, BID, Agbata, Tochukwu, MD, 2 puff at 07/30/21 1925   multivitamin with minerals tablet 1 tablet, 1 tablet, Oral, Daily, Agbata, Tochukwu, MD, 1 tablet at 07/30/21 1216   Oxcarbazepine (TRILEPTAL) tablet 600 mg, 600 mg, Oral, BID, Agbata, Tochukwu, MD, 600 mg at 07/30/21 2125   oxyCODONE (Oxy IR/ROXICODONE) immediate release tablet 10-20 mg, 10-20 mg, Oral, Q4H PRN, Agbata, Tochukwu, MD, 10 mg at 07/30/21 2125   polyethylene glycol (MIRALAX / GLYCOLAX) packet 17 g, 17 g, Oral, Daily PRN, Agbata, Tochukwu, MD   potassium chloride SA (KLOR-CON) CR tablet 40 mEq, 40 mEq, Oral, Daily, Agbata, Tochukwu, MD, 40 mEq at 07/30/21 1217   traZODone (DESYREL) tablet 50-100 mg, 50-100 mg, Oral, QHS PRN, Agbata, Tochukwu, MD  Facility-Administered Medications Ordered in Other Encounters:    heparin lock flush 100 UNIT/ML injection, , , ,    heparin lock flush 100 UNIT/ML injection, , , ,    heparin lock flush 100 unit/mL, 500 Units, Intravenous, Once, Earlie Server, MD   heparin lock flush 100 unit/mL, 500 Units, Intracatheter, Once PRN, Earlie Server, MD   sodium chloride flush (NS) 0.9 % injection 10 mL, 10 mL, Intravenous, PRN, Earlie Server, MD, 10 mL at 10/10/20 1040   sodium chloride flush (NS) 0.9 % injection 10 mL, 10 mL, Intravenous, PRN, Earlie Server, MD, 10 mL at 11/13/20 0836   sodium chloride flush (NS) 0.9 % injection 10 mL, 10 mL, Intravenous, PRN,  Earlie Server, MD, 10 mL at 12/30/20 0838   sodium chloride flush (NS) 0.9 % injection 10 mL, 10 mL, Intravenous, PRN, Earlie Server, MD, 10 mL at 01/14/21 0921   sodium chloride flush (NS) 0.9 % injection 10 mL, 10 mL, Intravenous, PRN, Earlie Server, MD, 10 mL at 06/11/21 0815   sodium chloride flush (NS) 0.9 % injection 10 mL, 10 mL, Intravenous, PRN, Earlie Server, MD, 10 mL at 07/09/21 0814   Physical exam:  Vitals:   07/31/21 0313 07/31/21 0417 07/31/21 0436 07/31/21 0726  BP:  135/78    Pulse:  96    Resp:  18  Temp:  98.6 F (37 C)    TempSrc:      SpO2:  96% 96% 95%  Weight: 126 lb 5.2 oz (57.3 kg)     Height:       Physical Exam Constitutional:      General: She is not in acute distress.    Appearance: She is not diaphoretic.  HENT:     Head: Normocephalic and atraumatic.     Nose: Nose normal.     Mouth/Throat:     Pharynx: No oropharyngeal exudate.     Comments: Chronic right side palate deficiency. Right facial swelling Eyes:     General: No scleral icterus.    Pupils: Pupils are equal, round, and reactive to light.  Cardiovascular:     Rate and Rhythm: Normal rate and regular rhythm.     Heart sounds: No murmur heard. Pulmonary:     Effort: Pulmonary effort is normal. No respiratory distress.     Breath sounds: Wheezing present. No rales.  Chest:     Chest wall: No tenderness.  Abdominal:     General: There is no distension.     Palpations: Abdomen is soft.     Tenderness: There is no abdominal tenderness.  Musculoskeletal:        General: Normal range of motion.     Cervical back: Normal range of motion and neck supple.  Skin:    General: Skin is warm and dry.     Findings: No erythema.  Neurological:     Mental Status: She is alert and oriented to person, place, and time.     Motor: No abnormal muscle tone.  Psychiatric:        Mood and Affect: Mood and affect normal.        CMP Latest Ref Rng & Units 07/31/2021  Glucose 70 - 99 mg/dL 77  BUN 8 - 23 mg/dL  11  Creatinine 0.44 - 1.00 mg/dL 0.40(L)  Sodium 135 - 145 mmol/L 137  Potassium 3.5 - 5.1 mmol/L 3.0(L)  Chloride 98 - 111 mmol/L 99  CO2 22 - 32 mmol/L 26  Calcium 8.9 - 10.3 mg/dL 8.0(L)  Total Protein 6.5 - 8.1 g/dL -  Total Bilirubin 0.3 - 1.2 mg/dL -  Alkaline Phos 38 - 126 U/L -  AST 15 - 41 U/L -  ALT 0 - 44 U/L -   CBC Latest Ref Rng & Units 07/31/2021  WBC 4.0 - 10.5 K/uL 7.9  Hemoglobin 12.0 - 15.0 g/dL 8.7(L)  Hematocrit 36.0 - 46.0 % 27.5(L)  Platelets 150 - 400 K/uL 494(H)    RADIOGRAPHIC STUDIES: I have personally reviewed the radiological images as listed and agreed with the findings in the report. CT CHEST ABDOMEN PELVIS W CONTRAST  Result Date: 07/30/2021 CLINICAL DATA:  65 year old female with history of multilobar pneumonia on chest x-ray and abdominal pain. 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 Possible sepsis. EXAM: CT CHEST, ABDOMEN, AND PELVIS WITH CONTRAST TECHNIQUE: Multidetector CT imaging of the chest, abdomen and pelvis was performed following the standard protocol during bolus administration of intravenous contrast. CONTRAST:  8mL OMNIPAQUE IOHEXOL 350 MG/ML SOLN COMPARISON:  PET-CT 07/08/2021. FINDINGS: CT CHEST FINDINGS Cardiovascular: Heart size is normal. There is no significant pericardial fluid, thickening or pericardial calcification. Aortic atherosclerosis. No definite coronary artery calcifications. Left internal jugular single-lumen porta cath with tip terminating in the distal superior vena cava. Mediastinum/Nodes: Multiple prominent but nonenlarged mediastinal lymph nodes are nonspecific. No definite pathologic lymphadenopathy identified  in mediastinal or hilar nodal stations. Esophagus is unremarkable in appearance. No axillary lymphadenopathy. Lungs/Pleura: Worsening patchy multifocal areas of ground-glass attenuation, septal thickening and thickening of the peribronchovascular interstitium is seen throughout the lungs bilaterally with no  discernible craniocaudal gradient. No confluent consolidative airspace disease. No pleural effusions. No definite suspicious appearing pulmonary nodules or masses are noted. Musculoskeletal: There are no aggressive appearing lytic or blastic lesions noted in the visualized portions of the skeleton. CT ABDOMEN PELVIS FINDINGS Hepatobiliary: No suspicious cystic or solid hepatic lesions. No intra or extrahepatic biliary ductal dilatation. Status post cholecystectomy. Pancreas: No pancreatic mass. No pancreatic ductal dilatation. No pancreatic or peripancreatic fluid collections or inflammatory changes. Spleen: Unremarkable. Adrenals/Urinary Tract: Bilateral kidneys and adrenal glands are normal in appearance. No hydroureteronephrosis. Urinary bladder is normal in appearance. Stomach/Bowel: The appearance of the stomach is normal. There is no pathologic dilatation of small bowel or colon. Normal appendix. Vascular/Lymphatic: Aortic atherosclerosis, without evidence of aneurysm or dissection in the abdominal or pelvic vasculature. No lymphadenopathy noted in the abdomen or pelvis. Reproductive: Status post hysterectomy. Ovaries are not confidently identified may be surgically absent or atrophic. Other: No significant volume of ascites.  No pneumoperitoneum. Musculoskeletal: Chronic compression fracture of anterior aspect of the superior endplate of L1, similar to the prior study from 05/01/2021 with minimal loss of anterior vertebral body height. There are no aggressive appearing lytic or blastic lesions noted in the visualized portions of the skeleton. IMPRESSION: 1. Marked worsening multifocal ground-glass attenuation and septal thickening in the lungs bilaterally when compared to prior PET-CT 07/08/2021. Findings are favored to reflect a drug-induced pneumonitis, but could alternatively reflect multilobar bilateral pneumonia from atypical infection. Further clinical evaluation is recommended. 2. No acute findings are  noted in the abdomen or pelvis. 3. No definite signs of metastatic disease in the chest, abdomen or pelvis. 4. Aortic atherosclerosis. 5. Additional incidental findings, as above. Electronically Signed   By: Vinnie Langton M.D.   On: 07/30/2021 06:53   NM PET Image Restag (PS) Skull Base To Thigh  Result Date: 07/09/2021 CLINICAL DATA:  Subsequent treatment strategy for head/neck cancer. EXAM: NUCLEAR MEDICINE PET SKULL BASE TO THIGH TECHNIQUE: 6.1 mCi F-18 FDG was injected intravenously. Full-ring PET imaging was performed from the skull base to thigh after the radiotracer. CT data was obtained and used for attenuation correction and anatomic localization. Fasting blood glucose: 91 mg/dl COMPARISON:  CT abdomen 05/01/2021 and PET 01/09/2021. CT chest 09/26/2020. 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 No hypermetabolic lymph nodes. Incidental CT findings: Associated osseous destruction surrounding the right maxillary sinus. CHEST: No hypermetabolic mediastinal, hilar or axillary lymph nodes. No hypermetabolic pulmonary nodules. Incidental CT findings: Left IJ Port-A-Cath terminates in the SVC. Heart is at the upper limits of normal in size. No pericardial or pleural effusion. Basilar predominant peribronchovascular ground-glass and mild septal thickening. ABDOMEN/PELVIS: No abnormal hypermetabolism in the liver, adrenal glands, spleen or pancreas. No hypermetabolic lymph nodes. Incidental CT findings: Liver is unremarkable. Cholecystectomy. Adrenal glands, kidneys, spleen, pancreas, stomach and bowel are grossly unremarkable. Atherosclerotic calcification of the aorta. SKELETON: No abnormal osseous hypermetabolism. Very mild hypermetabolism associated with a fracture  of the superior endplate of L1 is new from 01/09/2021. Incidental CT findings: Degenerative changes in the spine. IMPRESSION: 1. Continued  response to therapy as evidenced by decreased wall thickening and hypermetabolism associated with a cavitary right maxillary/nasopharyngeal mass. No evidence of distant metastatic disease. 2. Subacute compression of the L1 superior endplate, new from 01/09/2021. 3.  Aortic atherosclerosis (ICD10-I70.0). Electronically Signed   By: Lorin Picket M.D.   On: 07/09/2021 13:28   DG Chest Port 1 View  Result Date: 07/29/2021 CLINICAL DATA:  65 year old female with history of cough and weakness for the past several days. EXAM: PORTABLE CHEST 1 VIEW COMPARISON:  Chest x-ray 12/30/2020. FINDINGS: Left-sided internal jugular single-lumen porta cath with tip terminating in the distal superior vena cava. Lung volumes are normal. Diffuse interstitial prominence and peribronchial cuffing. No confluent consolidative airspace disease. No pleural effusions. No pneumothorax. No pulmonary nodule or mass noted. Pulmonary vasculature and the cardiomediastinal silhouette are within normal limits. Atherosclerotic calcifications in the thoracic aorta. IMPRESSION: 1. The appearance the chest suggests severe bronchitis, potentially with developing multilobar bronchopneumonia. 2. Aortic atherosclerosis. Electronically Signed   By: Vinnie Langton M.D.   On: 07/29/2021 19:39    Assessment and plan-   # Multifocal pneumonia, acute respiratory failure.  Likely secondary to aspiration due to dysphagia and recent nausea/vomiting.  Can not rule out immunotherapy related pneumonitis.  Speech evaluation for swallow function.  Continue empiric IV antibiotics. If no improvement of her respiratory status, will add steroid.  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  Neoplasm related pain Continue Fentanyl patch and oxycodone PRN Continue  gabapentin and Trileptal.   Thank you for allowing me to participate in the care of this patient.    Earlie Server, MD, PhD 07/31/2021

## 2021-08-01 ENCOUNTER — Inpatient Hospital Stay: Payer: Medicare Other

## 2021-08-01 DIAGNOSIS — C760 Malignant neoplasm of head, face and neck: Secondary | ICD-10-CM | POA: Diagnosis not present

## 2021-08-01 DIAGNOSIS — J9601 Acute respiratory failure with hypoxia: Secondary | ICD-10-CM | POA: Diagnosis not present

## 2021-08-01 DIAGNOSIS — G893 Neoplasm related pain (acute) (chronic): Secondary | ICD-10-CM | POA: Diagnosis not present

## 2021-08-01 DIAGNOSIS — L8990 Pressure ulcer of unspecified site, unspecified stage: Secondary | ICD-10-CM | POA: Insufficient documentation

## 2021-08-01 DIAGNOSIS — Z8679 Personal history of other diseases of the circulatory system: Secondary | ICD-10-CM | POA: Diagnosis not present

## 2021-08-01 DIAGNOSIS — R531 Weakness: Secondary | ICD-10-CM | POA: Diagnosis not present

## 2021-08-01 LAB — BASIC METABOLIC PANEL
Anion gap: 15 (ref 5–15)
BUN: 18 mg/dL (ref 8–23)
CO2: 21 mmol/L — ABNORMAL LOW (ref 22–32)
Calcium: 7.9 mg/dL — ABNORMAL LOW (ref 8.9–10.3)
Chloride: 105 mmol/L (ref 98–111)
Creatinine, Ser: 0.33 mg/dL — ABNORMAL LOW (ref 0.44–1.00)
GFR, Estimated: >60 mL/min (ref >60)
Glucose, Bld: 95 mg/dL (ref 70–99)
Potassium: 3.9 mmol/L (ref 3.5–5.1)
Sodium: 141 mmol/L (ref 135–145)

## 2021-08-01 MED ORDER — POTASSIUM CHLORIDE CRYS ER 10 MEQ PO TBCR
20.0000 meq | EXTENDED_RELEASE_TABLET | Freq: Once | ORAL | Status: AC
  Administered 2021-08-01: 20 meq via ORAL
  Filled 2021-08-01: qty 2

## 2021-08-01 MED ORDER — METHYLPREDNISOLONE SODIUM SUCC 125 MG IJ SOLR
60.0000 mg | Freq: Two times a day (BID) | INTRAMUSCULAR | Status: DC
  Administered 2021-08-01: 22:00:00 60 mg via INTRAVENOUS
  Filled 2021-08-01: qty 2

## 2021-08-01 MED ORDER — POTASSIUM CHLORIDE 20 MEQ PO PACK
20.0000 meq | PACK | Freq: Every day | ORAL | Status: DC
  Filled 2021-08-01: qty 1

## 2021-08-01 NOTE — Progress Notes (Signed)
PROGRESS NOTE    Shannon Schroeder   OMV:[id number]  DOB: 31-Mar-1956  DOA: 07/30/2021 PCP: Merryl Hacker, No   Brief Narrative:  Shannon Britain Ainsworthis a 65 y.o. 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  She was diagnosed with head and neck cancer in 2021 and is currently receiving Keytruda and Cetuximab.   She presents to the hospital for complaints of nausea dry heaves which started after her last dose of cancer treatment on 07/23/2021.  She admits to shortness of breath with exertion. Chest x-ray reveals diffuse interstitial prominence and peribronchial cuffing. CT chest abdomen pelvis with contrast reveals: Multifocal groundglass attenuation and septal thickening in the lungs bilaterally which are worse when compared to PET/CT on 07/08/2021 - No definite signs of metastatic disease in the chest abdomen pelvis  In the ED, she was started on antibiotics: Vancomycin, azithromycin and cefepime   Subjective: No new complaints.     Assessment & Plan:   Principal Problem:   Acute respiratory failure with hypoxemia (HCC) -Currently receiving ceftriaxone and azithromycin -May be related to aspiration however she is on 3 medications that can cause pneumonitis including Keytruda, cetuximab and Amiodarone -It is to be noted that the patient has not had any fevers, sweats or chills and does not have any leukocytosis - IV steroids started yesterday to treat for drug induced pneumonitis  - can continue Ceftriaxone and Azithromycin x 5 days - oxygen weaned out to 3 L today - s/p MBC, she has been started on liquid diet (she takes pureed and pudding thick liquids at baseline)   -Consult palliative care  Active Problems: COPD nicotine abuse - Continue nebulizer  treatments  Nausea and vomiting - Status post treatment for cancer - has resolved  Hypokalemia - We will replace via IV route  Right sided Maxillary cancer, squamous cell invasive cancer Generalized weakness with weight loss -She initially presented with a large right facial mass eroding into the maxillary area and multiple branches of the maxillary division of the right trigeminal nerve - Status post multiple cycles of chemotherapy-developed hypersensitivity to carboplatin in 5/22 -Receiving treatment by Dr. 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   Difficult to place patient No      Consultants:  Oncology Palliative care Procedures:  none Antimicrobials:  Anti-infectives (From admission, onward)    Start     Dose/Rate Route Frequency Ordered Stop  07/30/21 1800  cefTRIAXone (ROCEPHIN) 2 g in sodium chloride 0.9 % 100 mL IVPB        2 g 200 mL/hr over 30 Minutes Intravenous Daily-1800 07/30/21 1126 08/04/21 1759   07/30/21 1145  azithromycin (ZITHROMAX) 500 mg in sodium chloride 0.9 % 250 mL IVPB        500 mg 250 mL/hr over 60 Minutes  Intravenous Daily 07/30/21 1126 08/04/21 0959   07/30/21 0600  vancomycin (VANCOREADY) IVPB 1250 mg/250 mL        1,250 mg 166.7 mL/hr over 90 Minutes Intravenous  Once 07/30/21 0557 07/30/21 1019   07/30/21 0545  ceFEPIme (MAXIPIME) 1 g in sodium chloride 0.9 % 100 mL IVPB        1 g 200 mL/hr over 30 Minutes Intravenous  Once 07/30/21 0540 07/30/21 0753        Objective: Vitals:   07/31/21 2353 08/01/21 0456 08/01/21 0921 08/01/21 1043  BP: 133/84 135/73 126/76 (!) 142/71  Pulse: 65 64 68 91  Resp: 16 18 20 20   Temp: (!) 97 F (36.1 C) 98 F (36.7 C) (!) 97.3 F (36.3 C) 98.6 F (37 C)  TempSrc:    Axillary  SpO2: 100% 100% 100% 96%  Weight:      Height:        Intake/Output Summary (Last 24 hours) at 08/01/2021 1236 Last data filed at 08/01/2021 0456 Gross per 24 hour  Intake 1259.97 ml  Output 290 ml  Net 969.97 ml    Filed Weights   07/31/21 0313  Weight: 57.3 kg    Examination: General exam: Appears comfortable  HEENT: PERRLA, oral mucosa moist, no sclera icterus or thrush Respiratory system: bilateral crackles at bases- on 3 L O2 Cardiovascular system: S1 & S2 heard, regular rate and rhythm Gastrointestinal system: Abdomen soft, non-tender, nondistended. Normal bowel sounds   Central nervous system: Alert and oriented. No focal neurological deficits. Extremities: No cyanosis, clubbing or edema Skin: No rashes or ulcers Psychiatry:  Mood & affect appropriate.      Data Reviewed: I have personally reviewed following labs and imaging studies  CBC: Recent Labs  Lab 07/29/21 2030 07/31/21 0600  WBC 10.2 7.9  HGB 10.6* 8.7*  HCT 32.0* 27.5*  MCV 91.7 92.3  PLT 512* 494*    Basic Metabolic Panel: Recent Labs  Lab 07/29/21 2030 07/30/21 0551 07/31/21 0600 08/01/21 0749  NA 134*  --  137 141  K 3.2*  --  3.0* 3.9  CL 93*  --  99 105  CO2 27  --  26 21*  GLUCOSE 103*  --  77 95  BUN 11  --  11 18  CREATININE 0.46  --  0.40* 0.33*  CALCIUM  8.5*  --  8.0* 7.9*  MG  --  1.8  --   --     GFR: Estimated Creatinine Clearance: 62.6 mL/min (A) (by C-G formula based on SCr of 0.33 mg/dL (L)). Liver Function Tests: Recent Labs  Lab 07/29/21 2030  AST 21  ALT 21  ALKPHOS 216*  BILITOT 0.5  PROT 7.4  ALBUMIN 2.6*    No results for input(s): LIPASE, AMYLASE in the last 168 hours. No results for input(s): AMMONIA in the last 168 hours. Coagulation Profile: No results for input(s): INR, PROTIME in the last 168 hours. Cardiac Enzymes: No results for input(s): CKTOTAL, CKMB, CKMBINDEX, TROPONINI in the last 168 hours. BNP (last 3 results) No results for input(s): PROBNP in the last  8760 hours. HbA1C: No results for input(s): HGBA1C in the last 72 hours. CBG: No results for input(s): GLUCAP in the last 168 hours. Lipid Profile: No results for input(s): CHOL, HDL, LDLCALC, TRIG, CHOLHDL, LDLDIRECT in the last 72 hours. Thyroid Function Tests: No results for input(s): TSH, T4TOTAL, FREET4, T3FREE, THYROIDAB in the last 72 hours. Anemia Panel: No results for input(s): VITAMINB12, FOLATE, FERRITIN, TIBC, IRON, RETICCTPCT in the last 72 hours. Urine analysis:    Component Value Date/Time   COLORURINE AMBER (A) 07/30/2021 0057   APPEARANCEUR CLOUDY (A) 07/30/2021 0057   APPEARANCEUR Clear 12/21/2018 1041   LABSPEC 1.032 (H) 07/30/2021 0057   LABSPEC 1.017 04/23/2015 0000   PHURINE 6.0 07/30/2021 0057   GLUCOSEU NEGATIVE 07/30/2021 0057   HGBUR NEGATIVE 07/30/2021 0057   BILIRUBINUR SMALL (A) 07/30/2021 0057   BILIRUBINUR Negative 12/21/2018 1041   KETONESUR 20 (A) 07/30/2021 0057   PROTEINUR 100 (A) 07/30/2021 0057   NITRITE NEGATIVE 07/30/2021 0057   LEUKOCYTESUR SMALL (A) 07/30/2021 0057   Sepsis Labs: @LABRCNTIP (procalcitonin:4,lacticidven:4) ) Recent Results (from the past 240 hour(s))  Blood Culture (routine x 2)     Status: None (Preliminary result)   Collection Time: 07/30/21  5:51 AM   Specimen: BLOOD   Result Value Ref Range Status   Specimen Description BLOOD LEFT HAND  Final   Special Requests   Final    BOTTLES DRAWN AEROBIC AND ANAEROBIC Blood Culture adequate volume   Culture   Final    NO GROWTH 2 DAYS Performed at [facility], [address], [place], [place] [address]    Report Status PENDING  Incomplete  Blood Culture (routine x 2)     Status: None (Preliminary result)   Collection Time: 07/30/21  5:51 AM   Specimen: BLOOD  Result Value Ref Range Status   Specimen Description BLOOD RIGHT FOREARM  Final   Special Requests   Final    BOTTLES DRAWN AEROBIC AND ANAEROBIC Blood Culture adequate volume   Culture   Final    NO GROWTH 2 DAYS Performed at [facility], [address] [place], [place] [address]    Report Status PENDING  Incomplete  Urine Culture     Status: Abnormal   Collection Time: 07/30/21  5:51 AM   Specimen: Urine, Random  Result Value Ref Range Status   Specimen Description   Final    URINE, RANDOM Performed at [facility], [address] [place], [place] [address]    Special Requests   Final    NONE Performed at [facility], [address], [place], [place] [address]    Culture (A)  Final    <10,000 COLONIES/mL INSIGNIFICANT GROWTH Performed at [facility], [place] [address], [place], [place] [address]    Report Status 07/31/2021 FINAL  Final  Resp Panel by RT-PCR (Flu A&B, Covid) Nasopharyngeal Swab     Status: None   Collection Time: 07/30/21  5:51 AM   Specimen: Nasopharyngeal Swab; Nasopharyngeal(NP) swabs in vial transport medium  Result Value Ref Range Status   SARS Coronavirus 2 by RT PCR NEGATIVE NEGATIVE Final    Comment: (NOTE) SARS-CoV-2 target nucleic acids are NOT DETECTED.  The SARS-CoV-2 RNA is generally detectable in upper respiratory specimens during the acute phase of infection. The lowest concentration of SARS-CoV-2 viral copies this assay can detect is 138 copies/mL. A  negative result does not preclude SARS-Cov-2 infection and should not be used as the sole basis for  treatment or other patient management decisions. A negative result may occur with  improper specimen collection/handling, submission of specimen other than nasopharyngeal swab, presence of viral mutation(s) within the areas targeted by this assay, and inadequate number of viral copies(<138 copies/mL). A negative result must be combined with clinical observations, patient history, and epidemiological information. The expected result is Negative.  Fact Sheet for Patients:  EntrepreneurPulse.com.au  Fact Sheet for Healthcare Providers:  IncredibleEmployment.be  This test is no t yet approved or cleared by the [place] FDA and  has been authorized for detection and/or diagnosis of SARS-CoV-2 by FDA under an Emergency Use Authorization (EUA). This EUA will remain  in effect (meaning this test can be used) for the duration of the COVID-19 declaration under Section 564(b)(1) of the Act, 21 U.S.C.section 360bbb-3(b)(1), unless the authorization is terminated  or revoked sooner.       Influenza A by PCR NEGATIVE NEGATIVE Final   Influenza B by PCR NEGATIVE NEGATIVE Final    Comment: (NOTE) The Xpert Xpress SARS-CoV-2/FLU/RSV plus assay is intended as an aid in the diagnosis of influenza from Nasopharyngeal swab specimens and should not be used as a sole basis for treatment. Nasal washings and aspirates are unacceptable for Xpert Xpress SARS-CoV-2/FLU/RSV testing.  Fact Sheet for Patients: EntrepreneurPulse.com.au  Fact Sheet for Healthcare Providers: IncredibleEmployment.be  This test is not yet approved or cleared by the [place] FDA and has been authorized for detection and/or diagnosis of SARS-CoV-2 by FDA under an Emergency Use Authorization (EUA). This EUA will remain in effect (meaning this test can  be used) for the duration of the COVID-19 declaration under Section 564(b)(1) of the Act, 21 U.S.C. section 360bbb-3(b)(1), unless the authorization is terminated or revoked.  Performed at [facility], [address] [place], [place] [address]          Radiology Studies: No results found.    Scheduled Meds:  apixaban  5 mg Oral BID   Chlorhexidine Gluconate Cloth  6 each Topical Daily   docusate sodium  100 mg Oral Daily   fentaNYL  1 patch Transdermal Q72H   gabapentin  600 mg Oral TID   influenza vaccine adjuvanted  0.5 mL Intramuscular Tomorrow-1000   methylPREDNISolone (SOLU-MEDROL) injection  80 mg Intravenous BID   metoprolol tartrate  25 mg Oral BID   mometasone-formoterol  2 puff Inhalation BID   multivitamin with minerals  1 tablet Oral Daily   oxcarbazepine  600 mg Oral BID   sodium chloride flush  10-40 mL Intracatheter Q12H   Continuous Infusions:  0.9 % NaCl with KCl 40 mEq / L 100 mL/hr at 08/01/21 0305   azithromycin 500 mg (08/01/21 1053)   cefTRIAXone (ROCEPHIN)  IV Stopped (07/31/21 1856)     LOS: 2 days      Debbe Odea, MD [facility] Pager: [email or web address] 08/01/2021, 12:36 PM

## 2021-08-01 NOTE — TOC Initial Note (Signed)
Transition of Care [facility]) - Initial/Assessment Note    Patient Details  Name: Shannon Schroeder MRN: [id number] Date of Birth: 02/11/1956  Transition of Care [facility]) CM/SW Contact:    Shelbie Hutching, RN Phone Number: 08/01/2021, 4:16 PM  Clinical Narrative:                 Patient admitted to the hospital with acute respiratory failure requiring acute oxygen intitially at 7L [facility] now down to 3L [place].  Patient is a cancer patient followed by Dr. Tasia Catchings in the [place].  Patient is from home with her friend and caregiver who helps her with everything.  Caregiver's name is [name].  Patient would like to return home at discharge.  She will likely need home oxygen set up at discharge.  Potential for discharge Sunday.    Expected Discharge Plan: Home/Self Care Barriers to Discharge: Continued Medical Work up   Patient Goals and CMS Choice Patient states their goals for this hospitalization and ongoing recovery are:: Patient wants to go back home      Expected Discharge Plan and Services Expected Discharge Plan: Home/Self Care   Discharge Planning Services: CM Consult   Living arrangements for the past 2 months: Single Family Home                           HH Arranged: NA          Prior Living Arrangements/Services Living arrangements for the past 2 months: Single Family Home Lives with:: Friends Patient language and need for interpreter reviewed:: Yes Do you feel safe going back to the place where you live?: Yes      Need for Family Participation in Patient Care: Yes (Comment) Care giver support system in place?: Yes (comment)   Criminal Activity/Legal Involvement Pertinent to Current Situation/Hospitalization: No - Comment as needed  Activities of Daily Living Home Assistive Devices/Equipment: Oxygen, Walker (specify type), Eyeglasses ADL Screening (condition at time of admission) Patient's cognitive ability adequate to safely complete daily activities?: Yes Is the  patient deaf or have difficulty hearing?: No Does the patient have difficulty seeing, even when wearing glasses/contacts?: No Does the patient have difficulty concentrating, remembering, or making decisions?: No Patient able to express need for assistance with ADLs?: Yes Does the patient have difficulty dressing or bathing?: No Independently performs ADLs?: Yes (appropriate for developmental age) Does the patient have difficulty walking or climbing stairs?: No Weakness of Legs: Both Weakness of Arms/Hands: None  Permission Sought/Granted Permission sought to share information with : Case Manager, Family Supports, Other (comment) Permission granted to share information with : Yes, Verbal Permission Granted  Share Information with NAME: Alda Berthold     Permission granted to share info w Relationship: friend and caregiver  Permission granted to share info w Contact Information: [phone]  Emotional Assessment Appearance:: Appears older than stated age Attitude/Demeanor/Rapport: Engaged Affect (typically observed): Accepting Orientation: : Oriented to Self, Oriented to Place, Oriented to  Time, Oriented to Situation Alcohol / Substance Use: Not Applicable Psych Involvement: No (comment)  Admission diagnosis:  Bronchopneumonia [J18.0] Pneumonia [J18.9] Immunocompromised state ([place]) [D84.9] Generalized weakness [R53.1] Sepsis ([place]) [A41.9] Acute respiratory failure with hypoxemia (HCC) [J96.01] Patient Active Problem List   Diagnosis Date Noted   Generalized weakness    Acute respiratory failure with hypoxemia ([place])    Pneumonia 07/30/2021   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   Encounter for antineoplastic immunotherapy 04/23/2021   Moderate protein-calorie malnutrition ([place])  04/14/2021   Dehydration 10/14/2020   SVT (supraventricular tachycardia) (HCC)    Hypomagnesemia    Dysphagia    Severe sepsis ([place]) 09/06/2020   Drug-induced constipation 08/26/2020   Hypotension  08/12/2020   Encounter for antineoplastic chemotherapy 08/08/2020   Atrial fibrillation with RVR ([place]) 08/08/2020   Elevated troponin 08/08/2020   Leukocytosis 08/08/2020   Hypokalemia 08/08/2020   SIRS (systemic inflammatory response syndrome) ([place]) 08/08/2020   Goals of care, counseling/discussion 08/06/2020   Head and neck cancer ([place]) 07/29/2020   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[place]) 07/29/2020   Weight loss 07/29/2020   Neoplasm related pain 07/29/2020   Abscess of upper gum 09/12/2019   History of atrial fibrillation 03/22/2019   Bipolar affective disorder, current episode mixed ([place]) 06/29/2018   Cerebral edema ([place]) 06/25/2018   Hypertensive emergency 06/16/2018   Subarachnoid bleed ([place]) 06/16/2018   TIA (transient ischemic attack) 06/15/2018   Tobacco abuse 06/02/2018   COPD (chronic obstructive pulmonary disease) ([place]) 04/21/2018   URI (upper respiratory infection) 03/31/2018   Hypertension 03/31/2018   Tachycardia 02/02/2018   Rash of body 02/02/2018   Hyponatremia 02/02/2018   PCP:  Pcp, No Pharmacy:   [place] [place] [facility] [place] [address] Phone: [phone] Fax: [phone]     Social Determinants of Health (SDOH) Interventions    Readmission Risk Interventions Readmission Risk Prevention Plan 09/08/2020  Transportation Screening Complete  PCP or Specialist Appt within 3-5 Days Complete  HRI or Home Care Consult Complete  Social Work Consult for [place] Planning/Counseling Complete  Palliative Care Screening Not Applicable  Medication Review Press photographer) Complete  Some recent data might be hidden

## 2021-08-01 NOTE — Progress Notes (Signed)
Nurse tech attempted to ambulate patient in room, patient was only able to walk in place at this time but o2 saturation did not drop, she said she felt like she was going to faint, got scared, cried and wanted to sit back down.   HR elevated on ambulation to 122.  Emotional support provided to patient.   MD notified

## 2021-08-01 NOTE — Progress Notes (Signed)
Hematology/Oncology Progress Note [facility] [phone] [phone]  Patient Care Team: Pcp, No as PCP - General Rockey Situ Kathlene November, MD as PCP - Cardiology (Cardiology) Earlie Server, MD as Consulting Physician (Oncology)   Name of the patient: Shannon Schroeder  [id number]  05/28/1956  Date of visit: 08/01/21   INTERVAL HISTORY-  Patient had speech swallow evaluation done today.  Results is pending. Started on liquid diet. She remains on nasal cannula oxygen, 3 L today. She reports that shortness of breath remains the same.  Wheezing is better today.  Nausea has improved.  Review of systems- Review of Systems  Constitutional:  Positive for appetite change, fatigue and unexpected weight change. Negative for chills and fever.  HENT:   Negative for hearing loss and voice change.   Eyes:  Negative for eye problems.  Respiratory:  Positive for shortness of breath and wheezing. Negative for chest tightness and cough.   Cardiovascular:  Negative for chest pain.  Gastrointestinal:  Negative for abdominal distention, abdominal pain, blood in stool, nausea and vomiting.  Endocrine: Negative for hot flashes.  Genitourinary:  Negative for difficulty urinating and frequency.   Musculoskeletal:  Negative for arthralgias.  Skin:  Negative for itching and rash.  Neurological:  Negative for extremity weakness.  Hematological:  Negative for adenopathy.  Psychiatric/Behavioral:  Negative for confusion.    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   Encounter for antineoplastic immunotherapy 04/23/2021   Moderate protein-calorie malnutrition ([place]) 04/14/2021   Dehydration 10/14/2020   SVT (supraventricular tachycardia) ([place])    Hypomagnesemia    Dysphagia    Severe sepsis ([place]) 09/06/2020   Drug-induced constipation 08/26/2020   Hypotension 08/12/2020   Encounter for antineoplastic chemotherapy 08/08/2020   Atrial fibrillation with RVR ([place]) 08/08/2020   Elevated troponin 08/08/2020   Leukocytosis 08/08/2020   Hypokalemia 08/08/2020   SIRS (systemic inflammatory response syndrome) ([place]) 08/08/2020   Goals of care, counseling/discussion 08/06/2020   Head and neck cancer ([place]) 07/29/2020   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   Weight loss 07/29/2020   Neoplasm related pain 07/29/2020   Abscess of upper gum 09/12/2019   History of atrial fibrillation 03/22/2019   Bipolar affective disorder, current episode mixed ([place]) 06/29/2018   Cerebral edema ([place]) 06/25/2018   Hypertensive emergency 06/16/2018   Subarachnoid bleed ([place]) 06/16/2018   TIA (transient ischemic attack) 06/15/2018   Tobacco abuse 06/02/2018   COPD (chronic obstructive pulmonary disease) ([place]) 04/21/2018   URI (upper respiratory infection) 03/31/2018   Hypertension 03/31/2018   Tachycardia 02/02/2018   Rash of body 02/02/2018   Hyponatremia 02/02/2018     Past Medical History:  Diagnosis Date   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    Anxiety    Arthritis    Bipolar disorder (HCC)    COPD (chronic obstructive pulmonary disease) (HCC)    NO INHALERS   Depression  Dysrhythmia    H/O V TACH   Head and neck cancer ([place]) 07/29/2020   Headache    H/O MIGRAINES   Hypertension    Seizures ([place])    X1 AFTER FALL     Past Surgical History:  Procedure Laterality Date   ABDOMINAL HYSTERECTOMY     partial    BREAST SURGERY     biopsy   CARDIAC CATHETERIZATION     X 2   CARPAL TUNNEL RELEASE Right    CARPECTOMY HAND Right    CATARACT EXTRACTION W/PHACO Left 03/08/2018   Procedure: CATARACT EXTRACTION PHACO AND INTRAOCULAR LENS PLACEMENT ([facility]);  Surgeon: Birder Robson, MD;  Location: [facility] ORS;  Service: Ophthalmology;  Laterality: Left;  [place] 00:55 AP% 13.5 CDE 7.52 Fluid pack lot # [id number]   CATARACT EXTRACTION W/PHACO Right 04/05/2018   Procedure: CATARACT EXTRACTION PHACO AND INTRAOCULAR LENS PLACEMENT ([facility]);  Surgeon: Birder Robson, MD;  Location: [facility] ORS;  Service: Ophthalmology;  Laterality: Right;  [place] 00:36 AP% 15.9 CDE 5.72 Fluid pack lot # [id number]   CEREBRAL ANEURYSM REPAIR     CHOLECYSTECTOMY     COLONOSCOPY WITH PROPOFOL N/A 04/26/2019   Procedure: COLONOSCOPY WITH PROPOFOL;  Surgeon: Jonathon Bellows, MD;  Location: [facility] ENDOSCOPY;  Service: Gastroenterology;  Laterality: N/A;   PORTACATH PLACEMENT Left 08/05/2020   Procedure: INSERTION PORT-A-CATH;  Surgeon: Herbert Pun, MD;  Location: [facility] ORS;  Service: General;  Laterality: Left;   TUBAL LIGATION      Social History   Socioeconomic History   Marital status: Divorced    Spouse name: Not on file   Number of children: Not on file   Years of education: 16   Highest education level: Bachelor's degree (e.g., BA, AB, BS)  Occupational History   Occupation: farmer  Tobacco Use   Smoking status: Every Day    Packs/day: 0.25    Years: 50.00    Pack years: 12.50    Types: Cigarettes   Smokeless tobacco: Never  Vaping Use   Vaping Use: Never used  Substance and Sexual Activity   Alcohol use: No   Drug use: No   Sexual activity: Not Currently  Other Topics Concern   Not on file  Social History Narrative   Pt lives at [facility].    Social Determinants of Health   Financial Resource Strain: Not on file  Food Insecurity: Not on file  Transportation Needs: Not on file  Physical Activity: Not on file   Stress: Not on file  Social Connections: Not on file  Intimate Partner Violence: Not on file     Family History  Problem Relation Age of Onset   Hypertension Mother    Heart failure Mother    Hypertension Brother    Stroke Brother    Hypertension Son    Emphysema Maternal Aunt    Hypertension Paternal Aunt    Hypertension Paternal Uncle    Hypertension Maternal Grandmother    Hypertension Paternal Grandmother      Current Facility-Administered Medications:    0.9 % NaCl with KCl 40 mEq / L  infusion, , Intravenous, Continuous, Rizwan, Saima, MD, Last Rate: 100 mL/hr at 08/01/21 0305, Infusion Verify at 08/01/21 0305   acetaminophen (TYLENOL) suppository 650 mg, 650 mg, Rectal, Q4H PRN, Sharion Settler, NP, 650 mg at 07/31/21 2055   apixaban (ELIQUIS) tablet 5 mg, 5 mg, Oral, BID, Agbata, Tochukwu, MD, 5 mg at 08/01/21 1055   azithromycin (ZITHROMAX) 500 mg in sodium chloride 0.9 %  250 mL IVPB, 500 mg, Intravenous, Daily, Agbata, Tochukwu, MD, Last Rate: 250 mL/hr at 08/01/21 1053, 500 mg at 08/01/21 1053   cefTRIAXone (ROCEPHIN) 2 g in sodium chloride 0.9 % 100 mL IVPB, 2 g, Intravenous, q1800, Agbata, Tochukwu, MD, Stopped at 07/31/21 1856   Chlorhexidine Gluconate Cloth 2 % PADS 6 each, 6 each, Topical, Daily, Debbe Odea, MD, 6 each at 08/01/21 1112   docusate sodium (COLACE) capsule 100 mg, 100 mg, Oral, Daily, Agbata, Tochukwu, MD, 100 mg at 08/01/21 1055   fentaNYL (DURAGESIC) 75 MCG/HR 1 patch, 1 patch, Transdermal, Q72H, Agbata, Tochukwu, MD   gabapentin (NEURONTIN) capsule 600 mg, 600 mg, Oral, TID, Agbata, Tochukwu, MD, 600 mg at 08/01/21 1119   influenza vaccine adjuvanted (FLUAD) injection 0.5 mL, 0.5 mL, Intramuscular, Tomorrow-1000, Agbata, Tochukwu, MD   ipratropium-albuterol (DUONEB) 0.5-2.5 (3) MG/3ML nebulizer solution 3 mL, 3 mL, Nebulization, Q6H PRN, Agbata, Tochukwu, MD   melatonin tablet 10 mg, 10 mg, Oral, QHS PRN, Agbata, Tochukwu, MD    methylPREDNISolone sodium succinate (SOLU-MEDROL) 125 mg/2 mL injection 80 mg, 80 mg, Intravenous, BID, Rizwan, Saima, MD, 80 mg at 08/01/21 1047   metoprolol tartrate (LOPRESSOR) tablet 25 mg, 25 mg, Oral, BID, Rizwan, Saima, MD, 25 mg at 08/01/21 1055   mometasone-formoterol (DULERA) 100-5 MCG/ACT inhaler 2 puff, 2 puff, Inhalation, BID, Agbata, Tochukwu, MD, 2 puff at 08/01/21 1307   multivitamin with minerals tablet 1 tablet, 1 tablet, Oral, Daily, Agbata, Tochukwu, MD, 1 tablet at 07/30/21 1216   Oxcarbazepine (TRILEPTAL) tablet 600 mg, 600 mg, Oral, BID, Agbata, Tochukwu, MD, 600 mg at 08/01/21 1119   oxyCODONE (Oxy IR/ROXICODONE) immediate release tablet 10-20 mg, 10-20 mg, Oral, Q4H PRN, Nazari, Walid A, RPH   polyethylene glycol (MIRALAX / GLYCOLAX) packet 17 g, 17 g, Oral, Daily PRN, Agbata, Tochukwu, MD   sodium chloride flush (NS) 0.9 % injection 10-40 mL, 10-40 mL, Intracatheter, Q12H, Rizwan, Saima, MD, 20 mL at 08/01/21 1108   sodium chloride flush (NS) 0.9 % injection 10-40 mL, 10-40 mL, Intracatheter, PRN, Debbe Odea, MD   traZODone (DESYREL) tablet 50 mg, 50 mg, Oral, QHS PRN, Brantley Stage, Walid A, RPH  Facility-Administered Medications Ordered in Other Encounters:    heparin lock flush 100 UNIT/ML injection, , , ,    heparin lock flush 100 UNIT/ML injection, , , ,    heparin lock flush 100 unit/mL, 500 Units, Intravenous, Once, Earlie Server, MD   heparin lock flush 100 unit/mL, 500 Units, Intracatheter, Once PRN, Earlie Server, MD   sodium chloride flush (NS) 0.9 % injection 10 mL, 10 mL, Intravenous, PRN, Earlie Server, MD, 10 mL at 10/10/20 1040   sodium chloride flush (NS) 0.9 % injection 10 mL, 10 mL, Intravenous, PRN, Earlie Server, MD, 10 mL at 11/13/20 0836   sodium chloride flush (NS) 0.9 % injection 10 mL, 10 mL, Intravenous, PRN, Earlie Server, MD, 10 mL at 12/30/20 0838   sodium chloride flush (NS) 0.9 % injection 10 mL, 10 mL, Intravenous, PRN, Earlie Server, MD, 10 mL at 01/14/21 0921   sodium  chloride flush (NS) 0.9 % injection 10 mL, 10 mL, Intravenous, PRN, Earlie Server, MD, 10 mL at 06/11/21 0815   sodium chloride flush (NS) 0.9 % injection 10 mL, 10 mL, Intravenous, PRN, Earlie Server, MD, 10 mL at 07/09/21 0814   Physical exam:  Vitals:   07/31/21 2353 08/01/21 0456 08/01/21 0921 08/01/21 1043  BP: 133/84 135/73 126/76 (!) 142/71  Pulse: 65 64 68 91  Resp: 16 18 20 20   Temp: (!) 97 F (36.1 C) 98 F (36.7 C) (!) 97.3 F (36.3 C) 98.6 F (37 C)  TempSrc:    Axillary  SpO2: 100% 100% 100% 96%  Weight:      Height:       Physical Exam Constitutional:      General: She is not in acute distress.    Appearance: She is not diaphoretic.  HENT:     Head: Normocephalic and atraumatic.     Nose: Nose normal.     Mouth/Throat:     Pharynx: No oropharyngeal exudate.     Comments:  Chronic right side palate deficiency. Right facial swelling Eyes:     General: No scleral icterus.    Pupils: Pupils are equal, round, and reactive to light.  Cardiovascular:     Rate and Rhythm: Normal rate and regular rhythm.     Heart sounds: No murmur heard. Pulmonary:     Effort: Pulmonary effort is normal. No respiratory distress.     Breath sounds: No wheezing or rales.  Abdominal:     General: There is no distension.     Palpations: Abdomen is soft.     Tenderness: There is no abdominal tenderness.  Musculoskeletal:        General: Normal range of motion.     Cervical back: Normal range of motion and neck supple.  Skin:    General: Skin is warm and dry.     Findings: No erythema.  Neurological:     Mental Status: She is alert and oriented to person, place, and time.     Cranial Nerves: No cranial nerve deficit.     Motor: No abnormal muscle tone.     Coordination: Coordination normal.  Psychiatric:        Mood and Affect: Mood and affect normal.       CMP Latest Ref Rng & Units 08/01/2021  Glucose 70 - 99 mg/dL 95  BUN 8 - 23 mg/dL 18  Creatinine 0.44 - 1.00 mg/dL 0.33(L)   Sodium 135 - 145 mmol/L 141  Potassium 3.5 - 5.1 mmol/L 3.9  Chloride 98 - 111 mmol/L 105  CO2 22 - 32 mmol/L 21(L)  Calcium 8.9 - 10.3 mg/dL 7.9(L)  Total Protein 6.5 - 8.1 g/dL -  Total Bilirubin 0.3 - 1.2 mg/dL -  Alkaline Phos 38 - 126 U/L -  AST 15 - 41 U/L -  ALT 0 - 44 U/L -   CBC Latest Ref Rng & Units 07/31/2021  WBC 4.0 - 10.5 K/uL 7.9  Hemoglobin 12.0 - 15.0 g/dL 8.7(L)  Hematocrit 36.0 - 46.0 % 27.5(L)  Platelets 150 - 400 K/uL 494(H)    RADIOGRAPHIC STUDIES: I have personally reviewed the radiological images as listed and agreed with the findings in the report. CT CHEST ABDOMEN PELVIS W CONTRAST  Result Date: 07/30/2021 CLINICAL DATA:  65 year old female with history of multilobar pneumonia on chest x-ray and abdominal pain. 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 Possible sepsis. EXAM: CT CHEST, ABDOMEN, AND PELVIS WITH CONTRAST TECHNIQUE: Multidetector CT imaging of the chest, abdomen and pelvis was performed following the standard protocol during bolus administration of intravenous contrast. CONTRAST:  31mL OMNIPAQUE IOHEXOL 350 MG/ML SOLN COMPARISON:  PET-CT 07/08/2021. FINDINGS: CT CHEST FINDINGS Cardiovascular: Heart size is normal. There is no significant pericardial fluid, thickening or pericardial calcification. Aortic atherosclerosis. No definite coronary artery calcifications. Left internal jugular single-lumen porta cath with tip terminating in the  distal superior vena cava. Mediastinum/Nodes: Multiple prominent but nonenlarged mediastinal lymph nodes are nonspecific. No definite pathologic lymphadenopathy identified in mediastinal or hilar nodal stations. Esophagus is unremarkable in appearance. No axillary lymphadenopathy. Lungs/Pleura: Worsening patchy multifocal areas of ground-glass attenuation, septal thickening and thickening of the peribronchovascular interstitium is seen throughout the lungs bilaterally with no discernible craniocaudal gradient. No  confluent consolidative airspace disease. No pleural effusions. No definite suspicious appearing pulmonary nodules or masses are noted. Musculoskeletal: There are no aggressive appearing lytic or blastic lesions noted in the visualized portions of the skeleton. CT ABDOMEN PELVIS FINDINGS Hepatobiliary: No suspicious cystic or solid hepatic lesions. No intra or extrahepatic biliary ductal dilatation. Status post cholecystectomy. Pancreas: No pancreatic mass. No pancreatic ductal dilatation. No pancreatic or peripancreatic fluid collections or inflammatory changes. Spleen: Unremarkable. Adrenals/Urinary Tract: Bilateral kidneys and adrenal glands are normal in appearance. No hydroureteronephrosis. Urinary bladder is normal in appearance. Stomach/Bowel: The appearance of the stomach is normal. There is no pathologic dilatation of small bowel or colon. Normal appendix. Vascular/Lymphatic: Aortic atherosclerosis, without evidence of aneurysm or dissection in the abdominal or pelvic vasculature. No lymphadenopathy noted in the abdomen or pelvis. Reproductive: Status post hysterectomy. Ovaries are not confidently identified may be surgically absent or atrophic. Other: No significant volume of ascites.  No pneumoperitoneum. Musculoskeletal: Chronic compression fracture of anterior aspect of the superior endplate of L1, similar to the prior study from 05/01/2021 with minimal loss of anterior vertebral body height. There are no aggressive appearing lytic or blastic lesions noted in the visualized portions of the skeleton. IMPRESSION: 1. Marked worsening multifocal ground-glass attenuation and septal thickening in the lungs bilaterally when compared to prior PET-CT 07/08/2021. Findings are favored to reflect a drug-induced pneumonitis, but could alternatively reflect multilobar bilateral pneumonia from atypical infection. Further clinical evaluation is recommended. 2. No acute findings are noted in the abdomen or pelvis. 3. No  definite signs of metastatic disease in the chest, abdomen or pelvis. 4. Aortic atherosclerosis. 5. Additional incidental findings, as above. Electronically Signed   By: Vinnie Langton M.D.   On: 07/30/2021 06:53   NM PET Image Restag (PS) Skull Base To Thigh  Result Date: 07/09/2021 CLINICAL DATA:  Subsequent treatment strategy for head/neck cancer. EXAM: NUCLEAR MEDICINE PET SKULL BASE TO THIGH TECHNIQUE: 6.1 mCi F-18 FDG was injected intravenously. Full-ring PET imaging was performed from the skull base to thigh after the radiotracer. CT data was obtained and used for attenuation correction and anatomic localization. Fasting blood glucose: 91 mg/dl COMPARISON:  CT abdomen 05/01/2021 and PET 01/09/2021. CT chest 09/26/2020. 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 No hypermetabolic lymph nodes. Incidental CT findings: Associated osseous destruction surrounding the right maxillary sinus. CHEST: No hypermetabolic mediastinal, hilar or axillary lymph nodes. No hypermetabolic pulmonary nodules. Incidental CT findings: Left IJ Port-A-Cath terminates in the SVC. Heart is at the upper limits of normal in size. No pericardial or pleural effusion. Basilar predominant peribronchovascular ground-glass and mild septal thickening. ABDOMEN/PELVIS: No abnormal hypermetabolism in the liver, adrenal glands, spleen or pancreas. No hypermetabolic lymph nodes. Incidental CT findings: Liver is unremarkable. Cholecystectomy. Adrenal glands, kidneys, spleen, pancreas, stomach and bowel are grossly unremarkable. Atherosclerotic calcification of the aorta. SKELETON: No abnormal osseous hypermetabolism. Very mild hypermetabolism associated with a fracture of the superior  endplate of L1 is new  from 01/09/2021. Incidental CT findings: Degenerative changes in the spine. IMPRESSION: 1. Continued response to therapy as evidenced by decreased wall thickening and hypermetabolism associated with a cavitary right maxillary/nasopharyngeal mass. No evidence of distant metastatic disease. 2. Subacute compression of the L1 superior endplate, new from 01/09/2021. 3.  Aortic atherosclerosis (ICD10-I70.0). Electronically Signed   By: Lorin Picket M.D.   On: 07/09/2021 13:28   DG Chest Port 1 View  Result Date: 07/29/2021 CLINICAL DATA:  65 year old female with history of cough and weakness for the past several days. EXAM: PORTABLE CHEST 1 VIEW COMPARISON:  Chest x-ray 12/30/2020. FINDINGS: Left-sided internal jugular single-lumen porta cath with tip terminating in the distal superior vena cava. Lung volumes are normal. Diffuse interstitial prominence and peribronchial cuffing. No confluent consolidative airspace disease. No pleural effusions. No pneumothorax. No pulmonary nodule or mass noted. Pulmonary vasculature and the cardiomediastinal silhouette are within normal limits. Atherosclerotic calcifications in the thoracic aorta. IMPRESSION: 1. The appearance the chest suggests severe bronchitis, potentially with developing multilobar bronchopneumonia. 2. Aortic atherosclerosis. Electronically Signed   By: Vinnie Langton M.D.   On: 07/29/2021 19:39    Assessment and plan-   #acute respiratory failure.  Multifocal aspiration pneumonia versus drug-induced pneumonitis. Speech evaluation for swallow function.  Aspiration precaution. Continue empiric IV antibiotics.  She was started on IV Solu-Medrol Wean down nasal cannula oxygen as tolerated.  #COPD, continue inhalers.  Also on IV steroids.     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  Hold treatment due to acute issue. Cautious and very challenging with immunotherapy in the future due to the possibility of immunotherapy induced pneumonitis.     Neoplasm related pain Continue Fentanyl patch and oxycodone PRN Continue gabapentin and Trileptal.   Palliative care evaluation. Recommend OT/PT  Thank you for allowing me to participate in the care of this patient.   Earlie Server, MD, PhD Hematology Oncology [facility] Pager- [phone] 08/01/2021

## 2021-08-01 NOTE — Evaluation (Addendum)
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    Anxiety    Arthritis    Bipolar disorder (HCC)    COPD (chronic obstructive pulmonary disease) (HCC)    NO INHALERS   Depression    Dysrhythmia    H/O V TACH   Head and neck cancer ([place]) 07/29/2020   Headache    H/O MIGRAINES   Hypertension    Seizures ([place])    X1 AFTER FALL   Past Surgical History:  Past Surgical History:  Procedure Laterality Date   ABDOMINAL HYSTERECTOMY     partial   BREAST SURGERY     biopsy   CARDIAC CATHETERIZATION     X 2   CARPAL TUNNEL RELEASE Right    CARPECTOMY HAND Right    CATARACT EXTRACTION W/PHACO Left 03/08/2018   Procedure: CATARACT EXTRACTION PHACO AND INTRAOCULAR LENS PLACEMENT ([place]);  Surgeon: Birder Robson, MD;  Location: [facility] ORS;  Service: Ophthalmology;  Laterality: Left;  [place] 00:55 AP% 13.5 CDE 7.52 Fluid pack lot # [id number]   CATARACT EXTRACTION W/PHACO Right 04/05/2018   Procedure: CATARACT EXTRACTION PHACO AND INTRAOCULAR LENS PLACEMENT ([facility]);  Surgeon: Birder Robson, MD;  Location: [facility] ORS;  Service: Ophthalmology;  Laterality: Right;  [place] 00:36 AP% 15.9 CDE 5.72 Fluid pack lot # [id number]   CEREBRAL ANEURYSM REPAIR     CHOLECYSTECTOMY     COLONOSCOPY WITH PROPOFOL N/A 04/26/2019   Procedure: COLONOSCOPY WITH PROPOFOL;  Surgeon: Jonathon Bellows, MD;  Location: [facility] ENDOSCOPY;  Service: Gastroenterology;  Laterality: N/A;   PORTACATH PLACEMENT Left 08/05/2020   Procedure: INSERTION PORT-A-CATH;  Surgeon: Herbert Pun, MD;  Location: [facility] ORS;   Service: General;  Laterality: Left;   TUBAL LIGATION     HPI: Pt is a 65 y.o. female with medical history significant for multiple medical issues including atrial fibrillation on chronic anticoagulation therapy, history of head and neck cancer on chemotherapy and immunotherapy, history of hypertension who presents to the emergency room for evaluation of increasing weakness and fatigue.  She was diagnosed with head and neck cancer in 2021 and is currently receiving Keytruda and Cetuximab.  She presents to the hospital for complaints of nausea dry heaves which started after her last dose of cancer treatment on 07/23/2021. She admits to shortness of breath with exertion.  Imaging in chart -- "Multifocal groundglass attenuation and septal thickening in the lungs bilaterally which are worse when compared to PET/CT on 07/08/2021"; noted multifocal pneumonia and H&N Cancer (dx'd in 2021).  Last tx was 07/23/2021.   Pt reports dysphagia at home.  She is currently on a more liquid-puree diet; and Ensure.   Subjective: pt awake, verbal. Dysarthria w/ dec'd intelligibility. Writes for communication.    Assessment / Plan / Recommendation  CHL IP CLINICAL IMPRESSIONS 08/01/2021  Clinical Impression Pt appears to present w/ Moderate-Severe oropharyngeal phase dysphagia resulting in delayed pharyngeal swallow initiation and laryngeal Penetration and Aspiration of liquids, intermittently. 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 Given time and using swallowing strategies and aspiration precautions, pt  demonstrates a somewhat functional oropharyngeal swallow using modified food consistency, and thin liquids Via CUP. Pt is at Risk for laryngeal Penetration and Aspiration of po's which in turn increases risk for negative sequelae from aspiration including Pulmonary decline.  During the pharyngeal phase, pt exhibited delayed pharyngeal swallow initiation w/  thin and Nectar liquids spilling from the Valleculae to the Pyriform Sinuses. "Flash", brief laryngeal penetration of thin and Nectar liquids occurred during the swallow; Aspiration of Nectar liquids (larger sip) occurred x1. Pt was initially insensate to the Aspiration -- a delayed Cough followed. The brief laryngeal Penetration (Silent) noted w/ liquids did not appear to build-up or remain in the laryngeal vestibule. Pt was not able to utilize a chin tuck strategy d/t oral phase deficits. Also noted was Moderate BOT and pharyngeal residue w/ all trials post initial swallow. Pt exhibited decreased BOT strength and decreased pharyngeal pressure during the swallow. Laryngeal excursion appeared wfl; epiglottic inversion wfl. Pt exhibited Multiple swallows to address the pharyngeal and BOT residue during each trial.  Utilizing swallowing strategies of SMALL single bites/sips, multiple, f/u DRY swallows, and use of throat clear/cough intermittently, pt appeared able to reduce the pharyngeal residue and reduce and/or clear material from the upper airway, laryngeal vestibule when it entered.  Pt's pharyngeal swallowing appeared min disorganized in attempts to use the f/u, Dry swallows, and there was a degree of decreased sensation of the bolus residue remaining. Suspect the timeliness and organization of the pharyngeal phase was impacted by the degree of oral phase deficits. Again, SMALL single bites/sips appeared to aid in control and clearing. During the oral phase, pt demonstrated decreased lingual strength/ROM secondary to the effects of the Maxillary Cancer which impacted bolus control and A-P transfer. Decreased bolus cohesion noted; piece-mealing of the each bolus occurred. Pt consistently Tilted Head back/up to aid lingual manipulation I A-P transfer. Lingual movements shifted bolus upward along BOT resulting in velopharyngeal residue. SMALL single bites/sips and use of Multiple f/u, Dry swallowed aided in  reducing oral residue remaining w/ each consistency.   Pt has been functional on her diet at home of mostly liquids and loose Puree foods. She stated choice to continue w/ this diet; adamently requesting "water" to drink at the end of this study.  W/ the presence of laryngeal Penetration and risk for aspiration w/ both thin and Nectar consistencies, recommend a Full Liquid - Pureed diet w/ thin liquids Via Cup w/ aspiration precautions and swallowing strategies. Pt was verbally instructed on the swallowing strategies and precautions; video viewed for feedback and practice. Same was posted in room post study.  SLP Visit Diagnosis Dysphagia, oropharyngeal phase (R13.12)  Attention and concentration deficit following --  Frontal lobe and executive function deficit following --  Impact on safety and function Moderate aspiration risk;Risk for inadequate nutrition/hydration      CHL IP TREATMENT RECOMMENDATION 08/01/2021  Treatment Recommendations Therapy as outlined in treatment plan below     Prognosis 08/01/2021  Prognosis for Safe Diet Advancement Fair  Barriers to Reach Goals Time post onset;Severity of deficits  Barriers/Prognosis Comment --    CHL IP DIET RECOMMENDATION 08/01/2021  SLP Diet Recommendations Dysphagia 1 (Puree) solids;Thin liquid  Liquid Administration via Cup;No straw  Medication Administration Whole meds with puree  Compensations Minimize environmental distractions;Slow rate;Small sips/bites;Lingual sweep for clearance of pocketing;Multiple dry swallows after each bite/sip;Follow solids with liquid;Clear throat intermittently;Hard cough after swallow  Postural Changes Remain semi-upright after after feeds/meals (Comment);Seated upright at 90 degrees  CHL IP OTHER RECOMMENDATIONS 08/01/2021  Recommended Consults (No Data)  Oral Care Recommendations Oral care BID;Oral care before and after PO;Patient independent with oral care  Other Recommendations (No Data)      CHL  IP FOLLOW UP RECOMMENDATIONS 08/01/2021  Follow up Recommendations (No Data)      CHL IP FREQUENCY AND DURATION 08/01/2021  Speech Therapy Frequency (ACUTE ONLY) min 2x/week  Treatment Duration 1 week           CHL IP ORAL PHASE 08/01/2021  Oral Phase Impaired  Oral - Pudding Teaspoon --  Oral - Pudding Cup --  Oral - Honey Teaspoon --  Oral - Honey Cup NT  Oral - Nectar Teaspoon --  Oral - Nectar Cup 3 trials  Oral - Nectar Straw --  Oral - Thin Teaspoon --  Oral - Thin Cup 8 trials  Oral - Thin Straw --  Oral - Puree 3 trials  Oral - Mech Soft --  Oral - Regular --  Oral - Multi-Consistency --  Oral - Pill --  Oral Phase - Comment --    CHL IP PHARYNGEAL PHASE 08/01/2021  Pharyngeal Phase Impaired  Pharyngeal- Pudding Teaspoon --  Pharyngeal --  Pharyngeal- Pudding Cup --  Pharyngeal --  Pharyngeal- Honey Teaspoon --  Pharyngeal --  Pharyngeal- Honey Cup NT  Pharyngeal --  Pharyngeal- Nectar Teaspoon --  Pharyngeal --  Pharyngeal- Nectar Cup 3 trials  Pharyngeal --  Pharyngeal- Nectar Straw --  Pharyngeal --  Pharyngeal- Thin Teaspoon --  Pharyngeal --  Pharyngeal- Thin Cup 8 trials  Pharyngeal --  Pharyngeal- Thin Straw --  Pharyngeal --  Pharyngeal- Puree 3 trials  Pharyngeal --  Pharyngeal- Mechanical Soft --  Pharyngeal --  Pharyngeal- Regular --  Pharyngeal --  Pharyngeal- Multi-consistency --  Pharyngeal --  Pharyngeal- Pill --  Pharyngeal --  Pharyngeal Comment --     CHL IP CERVICAL ESOPHAGEAL PHASE 08/01/2021  Cervical Esophageal Phase WFL  Pudding Teaspoon --  Pudding Cup --  Honey Teaspoon --  Honey Cup --  Nectar Teaspoon --  Nectar Cup --  Nectar Straw --  Thin Teaspoon --  Thin Cup --  Thin Straw --  Puree --  Mechanical Soft --  Regular --  Multi-consistency --  Pill --  Cervical Esophageal Comment --     Palmer Shorey 08/01/2021, 11:42 AM

## 2021-08-01 NOTE — Care Management Important Message (Signed)
Important Message  Patient Details  Name: Shannon Schroeder MRN: [id number] Date of Birth: 05/03/1956   Medicare Important Message Given:  N/A - LOS <3 / Initial given by admissions     Dannette Barbara 08/01/2021, 4:10 PM

## 2021-08-01 NOTE — Consult Note (Signed)
Pulmonary Medicine          Date: 08/01/2021,   MRN# [id number] ZI SEK [birth year]     AdmissionWeight: 57.3 kg                 CurrentWeight: 57.3 kg   Referring physician: Dr Raul Del    CHIEF COMPLAINT:   Multifocal ground glass infiltrates of bilateral lungs   HISTORY OF PRESENT ILLNESS    This is a 65 y.o. female with medical history significant for AF on chronic anticoagulation therapy, history of head and neck cancer on chemotherapy and immunotherapy, history of hypertension who presents to the emergency room for evaluation of worsening malaise. Currently on chemo per med-onc Dr Tasia Catchings.  Reports NV symptoms post chemo.  She has chronic dysphagia.  Reports over last week worsening dyspnea on exertion . Initial labs were unremarkable except for chronic anemia.  RVP was negative.  CT chest done showing multifocal GGO centrally predominant with generous PA diameter. PCCM consult for further evaluation and treatment.      PAST MEDICAL HISTORY   Past Medical History:  Diagnosis Date  . 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   Marland Kitchen Anxiety   . Arthritis   . Bipolar disorder ([place])   . COPD (chronic obstructive pulmonary disease) ([place])    NO INHALERS  . Depression   . Dysrhythmia    H/O V TACH  . Head and neck cancer ([place]) 07/29/2020  . Headache    H/O MIGRAINES  . Hypertension   . Seizures ([place])    X1 AFTER FALL     SURGICAL HISTORY   Past Surgical History:  Procedure Laterality Date  . ABDOMINAL HYSTERECTOMY     partial  . BREAST SURGERY     biopsy  . CARDIAC CATHETERIZATION     X 2  . CARPAL TUNNEL RELEASE Right   . CARPECTOMY HAND Right   . CATARACT EXTRACTION W/PHACO Left 03/08/2018   Procedure: CATARACT EXTRACTION PHACO AND INTRAOCULAR LENS PLACEMENT (IOC);  Surgeon: Birder Robson, MD;  Location: [facility] ORS;  Service: Ophthalmology;  Laterality: Left;  [place] 00:55 AP% 13.5 CDE 7.52 Fluid  pack lot # [id number]  . CATARACT EXTRACTION W/PHACO Right 04/05/2018   Procedure: CATARACT EXTRACTION PHACO AND INTRAOCULAR LENS PLACEMENT (IOC);  Surgeon: Birder Robson, MD;  Location: [facility] ORS;  Service: Ophthalmology;  Laterality: Right;  [place] 00:36 AP% 15.9 CDE 5.72 Fluid pack lot # [id number]  . CEREBRAL ANEURYSM REPAIR    . CHOLECYSTECTOMY    . COLONOSCOPY WITH PROPOFOL N/A 04/26/2019   Procedure: COLONOSCOPY WITH PROPOFOL;  Surgeon: Jonathon Bellows, MD;  Location: [facility] ENDOSCOPY;  Service: Gastroenterology;  Laterality: N/A;  . PORTACATH PLACEMENT Left 08/05/2020   Procedure: INSERTION PORT-A-CATH;  Surgeon: Herbert Pun, MD;  Location: [facility] ORS;  Service: General;  Laterality: Left;  . TUBAL LIGATION       FAMILY HISTORY   Family History  Problem Relation Age of Onset  . Hypertension Mother   . Heart failure Mother   . Hypertension Brother   . Stroke Brother   . Hypertension Son   . Emphysema Maternal Aunt   . Hypertension Paternal Aunt   . Hypertension Paternal Uncle   . Hypertension Maternal Grandmother   . Hypertension Paternal Grandmother      SOCIAL HISTORY   Social History   Tobacco Use  . Smoking status: Every Day    Packs/day: 0.25  Years: 50.00    Pack years: 12.50    Types: Cigarettes  . Smokeless tobacco: Never  Vaping Use  . Vaping Use: Never used  Substance Use Topics  . Alcohol use: No  . Drug use: No     MEDICATIONS    Home Medication:    Current Medication:  Current Facility-Administered Medications:  .  0.9 % NaCl with KCl 40 mEq / L  infusion, , Intravenous, Continuous, Rizwan, Saima, MD, Last Rate: 100 mL/hr at 08/01/21 1509, New Bag at 08/01/21 1509 .  acetaminophen (TYLENOL) suppository 650 mg, 650 mg, Rectal, Q4H PRN, Sharion Settler, NP, 650 mg at 07/31/21 2055 .  apixaban (ELIQUIS) tablet 5 mg, 5 mg, Oral, BID, Agbata, Tochukwu, MD, 5 mg at 08/01/21 1055 .  azithromycin (ZITHROMAX) 500 mg in sodium chloride 0.9 % 250 mL  IVPB, 500 mg, Intravenous, Daily, Agbata, Tochukwu, MD, Last Rate: 250 mL/hr at 08/01/21 1053, 500 mg at 08/01/21 1053 .  cefTRIAXone (ROCEPHIN) 2 g in sodium chloride 0.9 % 100 mL IVPB, 2 g, Intravenous, q1800, Agbata, Tochukwu, MD, Stopped at 07/31/21 1856 .  Chlorhexidine Gluconate Cloth 2 % PADS 6 each, 6 each, Topical, Daily, Debbe Odea, MD, 6 each at 08/01/21 1112 .  docusate sodium (COLACE) capsule 100 mg, 100 mg, Oral, Daily, Agbata, Tochukwu, MD, 100 mg at 08/01/21 1055 .  fentaNYL (DURAGESIC) 75 MCG/HR 1 patch, 1 patch, Transdermal, Q72H, Agbata, Tochukwu, MD .  gabapentin (NEURONTIN) capsule 600 mg, 600 mg, Oral, TID, Agbata, Tochukwu, MD, 600 mg at 08/01/21 1640 .  influenza vaccine adjuvanted (FLUAD) injection 0.5 mL, 0.5 mL, Intramuscular, Tomorrow-1000, Agbata, Tochukwu, MD .  ipratropium-albuterol (DUONEB) 0.5-2.5 (3) MG/3ML nebulizer solution 3 mL, 3 mL, Nebulization, Q6H PRN, Agbata, Tochukwu, MD .  melatonin tablet 10 mg, 10 mg, Oral, QHS PRN, Agbata, Tochukwu, MD .  methylPREDNISolone sodium succinate (SOLU-MEDROL) 125 mg/2 mL injection 80 mg, 80 mg, Intravenous, BID, Rizwan, Saima, MD, 80 mg at 08/01/21 1047 .  metoprolol tartrate (LOPRESSOR) tablet 25 mg, 25 mg, Oral, BID, Rizwan, Saima, MD, 25 mg at 08/01/21 1055 .  mometasone-formoterol (DULERA) 100-5 MCG/ACT inhaler 2 puff, 2 puff, Inhalation, BID, Agbata, Tochukwu, MD, 2 puff at 08/01/21 1307 .  multivitamin with minerals tablet 1 tablet, 1 tablet, Oral, Daily, Agbata, Tochukwu, MD, 1 tablet at 07/30/21 1216 .  Oxcarbazepine (TRILEPTAL) tablet 600 mg, 600 mg, Oral, BID, Agbata, Tochukwu, MD, 600 mg at 08/01/21 1119 .  oxyCODONE (Oxy IR/ROXICODONE) immediate release tablet 10-20 mg, 10-20 mg, Oral, Q4H PRN, Nazari, Walid A, RPH .  polyethylene glycol (MIRALAX / GLYCOLAX) packet 17 g, 17 g, Oral, Daily PRN, Agbata, Tochukwu, MD .  sodium chloride flush (NS) 0.9 % injection 10-40 mL, 10-40 mL, Intracatheter, Q12H, Rizwan,  Saima, MD, 20 mL at 08/01/21 1108 .  sodium chloride flush (NS) 0.9 % injection 10-40 mL, 10-40 mL, Intracatheter, PRN, Debbe Odea, MD .  traZODone (DESYREL) tablet 50 mg, 50 mg, Oral, QHS PRN, Brantley Stage, Walid A, RPH  Facility-Administered Medications Ordered in Other Encounters:  .  heparin lock flush 100 UNIT/ML injection, , , ,  .  heparin lock flush 100 UNIT/ML injection, , , ,  .  heparin lock flush 100 unit/mL, 500 Units, Intravenous, Once, Earlie Server, MD .  heparin lock flush 100 unit/mL, 500 Units, Intracatheter, Once PRN, Earlie Server, MD .  sodium chloride flush (NS) 0.9 % injection 10 mL, 10 mL, Intravenous, PRN, Earlie Server, MD, 10 mL at 10/10/20 1040 .  sodium chloride flush (NS) 0.9 % injection 10 mL, 10 mL, Intravenous, PRN, Earlie Server, MD, 10 mL at 11/13/20 0836 .  sodium chloride flush (NS) 0.9 % injection 10 mL, 10 mL, Intravenous, PRN, Earlie Server, MD, 10 mL at 12/30/20 4098 .  sodium chloride flush (NS) 0.9 % injection 10 mL, 10 mL, Intravenous, PRN, Earlie Server, MD, 10 mL at 01/14/21 0921 .  sodium chloride flush (NS) 0.9 % injection 10 mL, 10 mL, Intravenous, PRN, Earlie Server, MD, 10 mL at 06/11/21 0815 .  sodium chloride flush (NS) 0.9 % injection 10 mL, 10 mL, Intravenous, PRN, Earlie Server, MD, 10 mL at 07/09/21 1191    ALLERGIES   Naproxen, Aspirin, Belladonna alkaloids, Fluoxetine, Fluoxetine hcl, Paroxetine hcl, Phenobarbital, and Prozac [fluoxetine hcl]     REVIEW OF SYSTEMS    Review of Systems:  Gen:  Denies  fever, sweats, chills weigh loss  HEENT: Denies blurred vision, double vision, ear pain, eye pain, hearing loss, nose bleeds, sore throat Cardiac:  No dizziness, chest pain or heaviness, chest tightness,edema Resp:   Denies cough or sputum porduction, shortness of breath,wheezing, hemoptysis,  Gi: Denies swallowing difficulty, stomach pain, nausea or vomiting, diarrhea, constipation, bowel incontinence Gu:  Denies bladder incontinence, burning urine Ext:   Denies  Joint pain, stiffness or swelling Skin: Denies  skin rash, easy bruising or bleeding or hives Endoc:  Denies polyuria, polydipsia , polyphagia or weight change Psych:   Denies depression, insomnia or hallucinations   Other:  All other systems negative   VS: BP (!) 142/71   Pulse 91   Temp 98.6 F (37 C) (Axillary) Comment: Simultaneous filing. User may not have seen previous data.  Resp 20   Ht 5\' 6"  (1.676 m)   Wt 57.3 kg   LMP 11/19/1988 (Exact Date)   SpO2 96%   BMI 20.39 kg/m      PHYSICAL EXAM    GENERAL:NAD, no fevers, chills, no weakness no fatigue HEAD: Normocephalic, atraumatic.  EYES: Pupils equal, round, reactive to light. Extraocular muscles intact. No scleral icterus.  MOUTH: Moist mucosal membrane. Dentition intact. No abscess noted.  EAR, NOSE, THROAT: Clear without exudates. No external lesions.  NECK: Supple. No thyromegaly. No nodules. No JVD.  PULMONARY: Diffuse coarse rhonchi right sided +wheezes CARDIOVASCULAR: S1 and S2. Regular rate and rhythm. No murmurs, rubs, or gallops. No edema. Pedal pulses 2+ bilaterally.  GASTROINTESTINAL: Soft, nontender, nondistended. No masses. Positive bowel sounds. No hepatosplenomegaly.  MUSCULOSKELETAL: No swelling, clubbing, or edema. Range of motion full in all extremities.  NEUROLOGIC: Cranial nerves II through XII are intact. No gross focal neurological deficits. Sensation intact. Reflexes intact.  SKIN: No ulceration, lesions, rashes, or cyanosis. Skin warm and dry. Turgor intact.  PSYCHIATRIC: Mood, affect within normal limits. The patient is awake, alert and oriented x 3. Insight, judgment intact.       IMAGING    CT CHEST ABDOMEN PELVIS W CONTRAST  Result Date: 07/30/2021 CLINICAL DATA:  65 year old female with history of multilobar pneumonia on chest x-ray and abdominal pain. 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 Possible sepsis. EXAM: CT CHEST, ABDOMEN, AND PELVIS WITH CONTRAST TECHNIQUE:  Multidetector CT imaging of the chest, abdomen and pelvis was performed following the standard protocol during bolus administration of intravenous contrast. CONTRAST:  16mL OMNIPAQUE IOHEXOL 350 MG/ML SOLN COMPARISON:  PET-CT 07/08/2021. FINDINGS: CT CHEST FINDINGS Cardiovascular: Heart size is normal. There is no significant pericardial fluid, thickening or pericardial calcification. Aortic atherosclerosis. No definite  coronary artery calcifications. Left internal jugular single-lumen porta cath with tip terminating in the distal superior vena cava. Mediastinum/Nodes: Multiple prominent but nonenlarged mediastinal lymph nodes are nonspecific. No definite pathologic lymphadenopathy identified in mediastinal or hilar nodal stations. Esophagus is unremarkable in appearance. No axillary lymphadenopathy. Lungs/Pleura: Worsening patchy multifocal areas of ground-glass attenuation, septal thickening and thickening of the peribronchovascular interstitium is seen throughout the lungs bilaterally with no discernible craniocaudal gradient. No confluent consolidative airspace disease. No pleural effusions. No definite suspicious appearing pulmonary nodules or masses are noted. Musculoskeletal: There are no aggressive appearing lytic or blastic lesions noted in the visualized portions of the skeleton. CT ABDOMEN PELVIS FINDINGS Hepatobiliary: No suspicious cystic or solid hepatic lesions. No intra or extrahepatic biliary ductal dilatation. Status post cholecystectomy. Pancreas: No pancreatic mass. No pancreatic ductal dilatation. No pancreatic or peripancreatic fluid collections or inflammatory changes. Spleen: Unremarkable. Adrenals/Urinary Tract: Bilateral kidneys and adrenal glands are normal in appearance. No hydroureteronephrosis. Urinary bladder is normal in appearance. Stomach/Bowel: The appearance of the stomach is normal. There is no pathologic dilatation of small bowel or colon. Normal appendix. Vascular/Lymphatic:  Aortic atherosclerosis, without evidence of aneurysm or dissection in the abdominal or pelvic vasculature. No lymphadenopathy noted in the abdomen or pelvis. Reproductive: Status post hysterectomy. Ovaries are not confidently identified may be surgically absent or atrophic. Other: No significant volume of ascites.  No pneumoperitoneum. Musculoskeletal: Chronic compression fracture of anterior aspect of the superior endplate of L1, similar to the prior study from 05/01/2021 with minimal loss of anterior vertebral body height. There are no aggressive appearing lytic or blastic lesions noted in the visualized portions of the skeleton. IMPRESSION: 1. Marked worsening multifocal ground-glass attenuation and septal thickening in the lungs bilaterally when compared to prior PET-CT 07/08/2021. Findings are favored to reflect a drug-induced pneumonitis, but could alternatively reflect multilobar bilateral pneumonia from atypical infection. Further clinical evaluation is recommended. 2. No acute findings are noted in the abdomen or pelvis. 3. No definite signs of metastatic disease in the chest, abdomen or pelvis. 4. Aortic atherosclerosis. 5. Additional incidental findings, as above. Electronically Signed   By: Vinnie Langton M.D.   On: 07/30/2021 06:53   NM PET Image Restag (PS) Skull Base To Thigh  Result Date: 07/09/2021 CLINICAL DATA:  Subsequent treatment strategy for head/neck cancer. EXAM: NUCLEAR MEDICINE PET SKULL BASE TO THIGH TECHNIQUE: 6.1 mCi F-18 FDG was injected intravenously. Full-ring PET imaging was performed from the skull base to thigh after the radiotracer. CT data was obtained and used for attenuation correction and anatomic localization. Fasting blood glucose: 91 mg/dl COMPARISON:  CT abdomen 05/01/2021 and PET 01/09/2021. CT chest 09/26/2020. 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 No hypermetabolic lymph nodes. Incidental CT findings: Associated osseous destruction surrounding the right maxillary sinus. CHEST: No hypermetabolic mediastinal, hilar or axillary lymph nodes. No hypermetabolic pulmonary nodules. Incidental CT findings: Left IJ Port-A-Cath terminates in the SVC. Heart is at the upper limits of normal in size. No pericardial or pleural effusion. Basilar predominant peribronchovascular ground-glass and mild septal thickening. ABDOMEN/PELVIS: No abnormal hypermetabolism in the liver, adrenal glands, spleen or pancreas. No hypermetabolic lymph nodes. Incidental CT findings: Liver is unremarkable. Cholecystectomy. Adrenal glands, kidneys, spleen, pancreas, stomach and bowel are grossly unremarkable. Atherosclerotic calcification of the aorta. SKELETON:  No abnormal osseous hypermetabolism. Very mild hypermetabolism associated with a fracture of the superior endplate of L1 is new from 01/09/2021. Incidental CT findings: Degenerative changes in the spine. IMPRESSION: 1. Continued response to therapy as evidenced by decreased wall thickening and hypermetabolism associated with a cavitary right maxillary/nasopharyngeal mass. No evidence of distant metastatic disease. 2. Subacute compression of the L1 superior endplate, new from 01/09/2021. 3.  Aortic atherosclerosis (ICD10-I70.0). Electronically Signed   By: Lorin Picket M.D.   On: 07/09/2021 13:28   DG Chest Port 1 View  Result Date: 07/29/2021 CLINICAL DATA:  65 year old female with history of cough and weakness for the past several days. EXAM: PORTABLE CHEST 1 VIEW COMPARISON:  Chest x-ray 12/30/2020. FINDINGS: Left-sided internal jugular single-lumen porta cath with tip terminating in the distal superior vena cava. Lung volumes are normal. Diffuse interstitial prominence and peribronchial cuffing.  No confluent consolidative airspace disease. No pleural effusions. No pneumothorax. No pulmonary nodule or mass noted. Pulmonary vasculature and the cardiomediastinal silhouette are within normal limits. Atherosclerotic calcifications in the thoracic aorta. IMPRESSION: 1. The appearance the chest suggests severe bronchitis, potentially with developing multilobar bronchopneumonia. 2. Aortic atherosclerosis. Electronically Signed   By: Vinnie Langton M.D.   On: 07/29/2021 19:39      ASSESSMENT/PLAN   Bilateral multifocal pneumonia -suspect this is due to [place] however patient reports hemoptysis bright red blood and has significant decrement in hemoglobin.  Although she is on IV fluids remainder of cell lines are not proportionally reduced which suggests blood loss.  I agree with steroids.  She is improved already and should be better in 48 hours with steroids.  Agree with empiric abx.  She has received only 2 doses of Keytruda and I would suggest trying to give this again next month after improvement. She has dysphagia and higher risk for aspiration. Noted s/p SLP. Will reduce solumedrol to 60 bid today.   Per primary team AF-metoprolol COPD-dulera Head/Neck CA- per med onc Dr Tasia Catchings   Thank you for allowing me to participate in the care of this patient.   Patient/Family are satisfied with care plan and all questions have been answered.  This document was prepared using Dragon voice recognition software and may include unintentional dictation errors.     Ottie Glazier, M.D.  Division of [facility]

## 2021-08-02 DIAGNOSIS — J9601 Acute respiratory failure with hypoxia: Secondary | ICD-10-CM | POA: Diagnosis not present

## 2021-08-02 LAB — CBC WITH DIFFERENTIAL/PLATELET
Abs Immature Granulocytes: 0.07 K/uL (ref 0.00–0.07)
Basophils Absolute: 0 K/uL (ref 0.0–0.1)
Basophils Relative: 0 %
Eosinophils Absolute: 0 K/uL (ref 0.0–0.5)
Eosinophils Relative: 0 %
HCT: 26.6 % — ABNORMAL LOW (ref 36.0–46.0)
Hemoglobin: 8.6 g/dL — ABNORMAL LOW (ref 12.0–15.0)
Immature Granulocytes: 1 %
Lymphocytes Relative: 5 %
Lymphs Abs: 0.6 K/uL — ABNORMAL LOW (ref 0.7–4.0)
MCH: 30.3 pg (ref 26.0–34.0)
MCHC: 32.3 g/dL (ref 30.0–36.0)
MCV: 93.7 fL (ref 80.0–100.0)
Monocytes Absolute: 0.7 K/uL (ref 0.1–1.0)
Monocytes Relative: 6 %
Neutro Abs: 9.8 K/uL — ABNORMAL HIGH (ref 1.7–7.7)
Neutrophils Relative %: 88 %
Platelets: 610 K/uL — ABNORMAL HIGH (ref 150–400)
RBC: 2.84 MIL/uL — ABNORMAL LOW (ref 3.87–5.11)
RDW: 16.7 % — ABNORMAL HIGH (ref 11.5–15.5)
WBC: 11.2 K/uL — ABNORMAL HIGH (ref 4.0–10.5)
nRBC: 0 % (ref 0.0–0.2)

## 2021-08-02 MED ORDER — METHYLPREDNISOLONE SODIUM SUCC 40 MG IJ SOLR
40.0000 mg | Freq: Two times a day (BID) | INTRAMUSCULAR | Status: DC
  Administered 2021-08-02 – 2021-08-05 (×6): 40 mg via INTRAVENOUS
  Filled 2021-08-02 (×6): qty 1

## 2021-08-02 NOTE — Plan of Care (Signed)

## 2021-08-02 NOTE — Evaluation (Signed)
Occupational Therapy Evaluation Patient Details Name: Shannon Schroeder MRN: [id number] DOB: [birth year] Today's Date: 08/02/2021   History of Present Illness Pt is a 65 y/o F admitted on 07/30/21 after presenting with c/o worsening malaise. CT chest done showing multifocal GGO centrally predominant with generous PA diameter. Pt is being treated for Bilateral multifocal pneumonia. PMH: AF on chronic anticoagulation therapy, hx of head & neck CA on chemo & immunotherapy, HTN, aneurysm of anterior cerebral artery, anxiety, arthritis, bipolar, COPD, seizures   Clinical Impression   Pt seen for OT evaluation this date in setting of acute hospitalization d/t malaise. Pt presents this date with some general deconditioning and decreased fxl activity tolerance superimposed on some baseline weakness. Pt reports being MOD I for fxl mobility and ADLs at baseline. She uses 2WW for fxl mobility and shower seat to bathe. AT this time, pt is requiring SUPV/CGA for ADL transfers with RW and demos some unsteadiness as well as decreased standing and amb tolerance. Pt requires MIN/MOD A for LB ADLs. OT will continue to follow acutely and anticipate that pt could benefit from continued [place] f/u.      Recommendations for follow up therapy are one component of a multi-disciplinary discharge planning process, led by the attending physician.  Recommendations may be updated based on patient status, additional functional criteria and insurance authorization.   Follow Up Recommendations  Home health OT    Equipment Recommendations  3 in 1 bedside commode    Recommendations for Other Services       Precautions / Restrictions Precautions Precautions: Fall Restrictions Weight Bearing Restrictions: No      Mobility Bed Mobility               General bed mobility comments: up to chair pre/post OT    Transfers Overall transfer level: Needs assistance Equipment used: Rolling walker (2 wheeled) Transfers:  Sit to/from Stand;Stand Pivot Transfers Sit to Stand: Min guard;Supervision Stand pivot transfers: Min guard;Supervision            Balance Overall balance assessment: Needs assistance Sitting-balance support: Feet supported;Bilateral upper extremity supported Sitting balance-Leahy Scale: Good     Standing balance support: During functional activity;No upper extremity supported Standing balance-Leahy Scale: Fair Standing balance comment: B UE support with F balance for static, P balance for dynamic tasks                           ADL either performed or assessed with clinical judgement   ADL Overall ADL's : Needs assistance/impaired                                       General ADL Comments: INDEP for seated UB ADLs, MIN/MOD A for LB ADLs in sitting/standing, CGA/SUPV for transfers with RW     Vision Baseline Vision/History: 1 Wears glasses Ability to See in Adequate Light: 0 Adequate Patient Visual Report: No change from baseline       Perception     Praxis      Pertinent Vitals/Pain Pain Assessment: No/denies pain     Hand Dominance     Extremity/Trunk Assessment Upper Extremity Assessment Upper Extremity Assessment: Generalized weakness   Lower Extremity Assessment Lower Extremity Assessment: Generalized weakness   Cervical / Trunk Assessment Cervical / Trunk Assessment: Kyphotic   Communication Communication Communication: Other (comment) (difficulty related to loss of  portion of lips/jaw 2/2 cancer)   Cognition Arousal/Alertness: Awake/alert Behavior During Therapy: WFL for tasks assessed/performed Overall Cognitive Status: Within Functional Limits for tasks assessed                                     General Comments       Exercises Other Exercises Other Exercises: ed re: role of OT in acute setting. Pt with some familiarity   Shoulder Instructions      Home Living Family/patient expects to be  discharged to:: Private residence Living Arrangements: Non-relatives/Friends Available Help at Discharge: Family Type of Home: House Home Access: Stairs to enter Technical brewer of Steps: 3 Entrance Stairs-Rails: Right;Left;Can reach both [place]: One [place]: Environmental consultant - 2 wheels;Shower seat          Prior Functioning/Environment Level of Independence: Needs assistance  Gait / Transfers Assistance Needed: Pt ambulates with RW with PRN assistance from roommates/friends. Pt reports her roommate can carry her up/down steps to enter house if necessary.   Communication / Swallowing Assistance Needed: Difficulty with verbal communication.          OT Problem List: Decreased strength;Decreased activity tolerance      OT Treatment/Interventions:      OT Goals(Current goals can be found in the care plan section) Acute Rehab OT Goals Patient Stated Goal: go home OT Goal Formulation: With patient Time For Goal Achievement: 08/16/21 Potential to Achieve Goals: Good ADL Goals Pt Will Perform Grooming: with modified independence;standing (sink-side to complete 2-3 g/h tasks to increase fxl standing tolerance) Pt Will Perform Lower Body Dressing: with modified independence;sit to/from stand Pt Will Transfer to Toilet: with modified independence;ambulating (with LRAD as needed)  OT Frequency: Min 1X/week   Barriers to D/C:            Co-evaluation              AM-PAC OT "6 Clicks" Daily Activity     Outcome Measure Help from another person eating meals?: None Help from another person taking care of personal grooming?: A Little Help from another person toileting, which includes using toliet, bedpan, or urinal?: A Little Help from another person bathing (including washing, rinsing, drying)?: A Little Help from another person to put on and taking off regular upper body clothing?: None Help from another person to put on and taking off  regular lower body clothing?: A Little 6 Click Score: 20   End of Session Equipment Utilized During Treatment: Gait belt;Rolling walker;Oxygen Nurse Communication: Mobility status  Activity Tolerance: Patient tolerated treatment well Patient left: with call bell/phone within reach;in chair;with chair alarm set  OT Visit Diagnosis: Unsteadiness on feet (R26.81);Muscle weakness (generalized) (M62.81)                Time: 3419-3790 OT Time Calculation (min): 22 min Charges:  OT General Charges $OT Visit: 1 Visit OT Evaluation $OT Eval Moderate Complexity: 1 Mod OT Treatments $Self Care/Home Management : 8-22 mins  Gerrianne Scale, MS, OTR/L ascom [phone] 08/02/21, 4:49 PM

## 2021-08-02 NOTE — Progress Notes (Signed)
PROGRESS NOTE    Shannon Schroeder   WSF:[id number]  DOB: 10-Oct-1956  DOA: 07/30/2021 PCP: Shannon Schroeder, No   Brief Narrative:  Shannon Britain Ainsworthis a 65 y.o. 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  She was diagnosed with head and neck cancer in 2021 and is currently receiving Keytruda and Cetuximab.   She presents to the hospital for complaints of nausea, vomiting and dry heaves which started after her last dose of cancer treatment on 07/23/2021.  She admits to shortness of breath with exertion. Chest x-ray reveals diffuse interstitial prominence and peribronchial cuffing. CT chest abdomen pelvis with contrast reveals: Multifocal groundglass attenuation and septal thickening in the lungs bilaterally which are worse when compared to PET/CT on 07/08/2021 - No definite signs of metastatic disease in the chest abdomen pelvis  In the ED, she was started on antibiotics: Vancomycin, azithromycin and cefepime   Subjective: She is drinking fluids well. She has no complaints today. We have discussed the possibility of discharge tomorrow and she is ok with this.   Assessment & Plan:   Principal Problem:   Acute respiratory failure with hypoxemia (HCC) and hemoptysis  -Currently receiving ceftriaxone and azithromycin -May be related to aspiration however she is on 3 medications that can cause pneumonitis including Keytruda, Cetuximab and Amiodarone -It is to be noted that the patient has not had any fevers, sweats or chills and does not have any leukocytosis - plan to continue Ceftriaxone and Azithromycin x 5 days - IV steroids started to treat for drug induced pneumonitis  - s/p MBS, she has been started on liquid diet (she takes pureed and pudding thick liquids at  baseline) -Consult palliative care  Active Problems: Anemia, normocytic - acute drop in Hgb noted from 10.6 to 8.7 on 9/29- Hgb has been stable since - patient had hemoptysis and was receiving IVF- partly dilutional and partly due to hemoptysis  Thrombocytosis, chronic - cont to follow as outpt  COPD / nicotine abuse - Continue nebulizer treatments  Nausea and vomiting - Status post treatment for cancer - has resolved  Hypokalemia - has been replaced  Right sided Maxillary cancer, squamous cell invasive cancer Generalized weakness with weight loss -She initially presented with a large right facial mass eroding into the maxillary area and multiple branches of the maxillary division of the right trigeminal nerve - Status post multiple cycles of chemotherapy-developed hypersensitivity to carboplatin in 5/22 -Receiving treatment by Dr. 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   Difficult to place patient No      Consultants:  Oncology Palliative care Procedures:  none Antimicrobials:  Anti-infectives (From admission, onward)    Start      Dose/Rate Route Frequency Ordered Stop   07/30/21 1800  cefTRIAXone (ROCEPHIN) 2 g in sodium chloride 0.9 % 100 mL IVPB        2 g 200 mL/hr over 30 Minutes Intravenous Daily-1800 07/30/21 1126 08/04/21 1759   07/30/21 1145  azithromycin (ZITHROMAX) 500 mg in sodium chloride 0.9 % 250 mL IVPB        500 mg 250 mL/hr over 60 Minutes Intravenous Daily 07/30/21 1126 08/04/21 0959   07/30/21 0600  vancomycin (VANCOREADY) IVPB 1250 mg/250 mL        1,250 mg 166.7 mL/hr over 90 Minutes Intravenous  Once 07/30/21 0557 07/30/21 1019   07/30/21 0545  ceFEPIme (MAXIPIME) 1 g in sodium chloride 0.9 % 100 mL IVPB        1 g 200 mL/hr over 30 Minutes Intravenous  Once 07/30/21 0540 07/30/21 0753        Objective: Vitals:   08/02/21 0700 08/02/21 0756 08/02/21 0844 08/02/21 1151  BP:   133/83 124/80  Pulse:   65 74  Resp:   16 20  Temp:   97.7 F (36.5 C) 97.6 F (36.4 C)  TempSrc:    Oral  SpO2: 100% 99% 95% 95%  Weight:      Height:        Intake/Output Summary (Last 24 hours) at 08/02/2021 1314 Last data filed at 08/02/2021 0700 Gross per 24 hour  Intake 1080 ml  Output 300 ml  Net 780 ml    Filed Weights   07/31/21 0313  Weight: 57.3 kg    Examination: General exam: Appears comfortable  HEENT: PERRLA, oral mucosa moist, no sclera icterus or thrush Respiratory system: Clear to auscultation. Respiratory effort normal. Cardiovascular system: S1 & S2 heard, regular rate and rhythm Gastrointestinal system: Abdomen soft, non-tender, nondistended. Normal bowel sounds   Central nervous system: Alert and oriented. No focal neurological deficits. Extremities: No cyanosis, clubbing or edema Skin: No rashes or ulcers Psychiatry:  Mood & affect appropriate.      Data Reviewed: I have personally reviewed following labs and imaging studies  CBC: Recent Labs  Lab 07/29/21 2030 07/31/21 0600 08/02/21 1141  WBC 10.2 7.9 11.2*  NEUTROABS  --   --  9.8*  HGB 10.6* 8.7* 8.6*  HCT  32.0* 27.5* 26.6*  MCV 91.7 92.3 93.7  PLT 512* 494* 610*    Basic Metabolic Panel: Recent Labs  Lab 07/29/21 2030 07/30/21 0551 07/31/21 0600 08/01/21 0749  NA 134*  --  137 141  K 3.2*  --  3.0* 3.9  CL 93*  --  99 105  CO2 27  --  26 21*  GLUCOSE 103*  --  77 95  BUN 11  --  11 18  CREATININE 0.46  --  0.40* 0.33*  CALCIUM 8.5*  --  8.0* 7.9*  MG  --  1.8  --   --     GFR: Estimated Creatinine Clearance: 62.6 mL/min (A) (by C-G formula based on SCr of 0.33 mg/dL (L)). Liver Function Tests: Recent Labs  Lab 07/29/21 2030  AST 21  ALT 21  ALKPHOS 216*  BILITOT 0.5  PROT 7.4  ALBUMIN 2.6*    No results for input(s): LIPASE, AMYLASE in  the last 168 hours. No results for input(s): AMMONIA in the last 168 hours. Coagulation Profile: No results for input(s): INR, PROTIME in the last 168 hours. Cardiac Enzymes: No results for input(s): CKTOTAL, CKMB, CKMBINDEX, TROPONINI in the last 168 hours. BNP (last 3 results) No results for input(s): PROBNP in the last 8760 hours. HbA1C: No results for input(s): HGBA1C in the last 72 hours. CBG: No results for input(s): GLUCAP in the last 168 hours. Lipid Profile: No results for input(s): CHOL, HDL, LDLCALC, TRIG, CHOLHDL, LDLDIRECT in the last 72 hours. Thyroid Function Tests: No results for input(s): TSH, T4TOTAL, FREET4, T3FREE, THYROIDAB in the last 72 hours. Anemia Panel: No results for input(s): VITAMINB12, FOLATE, FERRITIN, TIBC, IRON, RETICCTPCT in the last 72 hours. Urine analysis:    Component Value Date/Time   COLORURINE AMBER (A) 07/30/2021 0057   APPEARANCEUR CLOUDY (A) 07/30/2021 0057   APPEARANCEUR Clear 12/21/2018 1041   LABSPEC 1.032 (H) 07/30/2021 0057   LABSPEC 1.017 04/23/2015 0000   PHURINE 6.0 07/30/2021 0057   GLUCOSEU NEGATIVE 07/30/2021 0057   HGBUR NEGATIVE 07/30/2021 0057   BILIRUBINUR SMALL (A) 07/30/2021 0057   BILIRUBINUR Negative 12/21/2018 1041   KETONESUR 20 (A) 07/30/2021 0057    PROTEINUR 100 (A) 07/30/2021 0057   NITRITE NEGATIVE 07/30/2021 0057   LEUKOCYTESUR SMALL (A) 07/30/2021 0057   Sepsis Labs: @LABRCNTIP (procalcitonin:4,lacticidven:4) ) Recent Results (from the past 240 hour(s))  Blood Culture (routine x 2)     Status: None (Preliminary result)   Collection Time: 07/30/21  5:51 AM   Specimen: BLOOD  Result Value Ref Range Status   Specimen Description BLOOD LEFT HAND  Final   Special Requests   Final    BOTTLES DRAWN AEROBIC AND ANAEROBIC Blood Culture adequate volume   Culture   Final    NO GROWTH 3 DAYS Performed at [facility], [address], [place], [place] [address]    Report Status PENDING  Incomplete  Blood Culture (routine x 2)     Status: None (Preliminary result)   Collection Time: 07/30/21  5:51 AM   Specimen: BLOOD  Result Value Ref Range Status   Specimen Description BLOOD RIGHT FOREARM  Final   Special Requests   Final    BOTTLES DRAWN AEROBIC AND ANAEROBIC Blood Culture adequate volume   Culture   Final    NO GROWTH 3 DAYS Performed at [facility], [address], [place], [place] [address]    Report Status PENDING  Incomplete  Urine Culture     Status: Abnormal   Collection Time: 07/30/21  5:51 AM   Specimen: Urine, Random  Result Value Ref Range Status   Specimen Description   Final    URINE, RANDOM Performed at [facility], [address] [place], [place] [address]    Special Requests   Final    NONE Performed at [facility], [address] [place], [place] [address]    Culture (A)  Final    <10,000 COLONIES/mL INSIGNIFICANT GROWTH Performed at [facility], [place] [address] [place], [place] [address]    Report Status 07/31/2021 FINAL  Final  Resp Panel by RT-PCR (Flu A&B, Covid) Nasopharyngeal Swab     Status: None   Collection Time: 07/30/21  5:51 AM   Specimen: Nasopharyngeal Swab; Nasopharyngeal(NP) swabs in vial transport medium  Result Value Ref Range  Status   SARS Coronavirus 2 by RT PCR NEGATIVE NEGATIVE Final    Comment: (NOTE) SARS-CoV-2 target  nucleic acids are NOT DETECTED.  The SARS-CoV-2 RNA is generally detectable in upper respiratory specimens during the acute phase of infection. The lowest concentration of SARS-CoV-2 viral copies this assay can detect is 138 copies/mL. A negative result does not preclude SARS-Cov-2 infection and should not be used as the sole basis for treatment or other patient management decisions. A negative result may occur with  improper specimen collection/handling, submission of specimen other than nasopharyngeal swab, presence of viral mutation(s) within the areas targeted by this assay, and inadequate number of viral copies(<138 copies/mL). A negative result must be combined with clinical observations, patient history, and epidemiological information. The expected result is Negative.  Fact Sheet for Patients:  EntrepreneurPulse.com.au  Fact Sheet for Healthcare Providers:  IncredibleEmployment.be  This test is no t yet approved or cleared by the [place] FDA and  has been authorized for detection and/or diagnosis of SARS-CoV-2 by FDA under an Emergency Use Authorization (EUA). This EUA will remain  in effect (meaning this test can be used) for the duration of the COVID-19 declaration under Section 564(b)(1) of the Act, 21 U.S.C.section 360bbb-3(b)(1), unless the authorization is terminated  or revoked sooner.       Influenza A by PCR NEGATIVE NEGATIVE Final   Influenza B by PCR NEGATIVE NEGATIVE Final    Comment: (NOTE) The Xpert Xpress SARS-CoV-2/FLU/RSV plus assay is intended as an aid in the diagnosis of influenza from Nasopharyngeal swab specimens and should not be used as a sole basis for treatment. Nasal washings and aspirates are unacceptable for Xpert Xpress SARS-CoV-2/FLU/RSV testing.  Fact Sheet for  Patients: EntrepreneurPulse.com.au  Fact Sheet for Healthcare Providers: IncredibleEmployment.be  This test is not yet approved or cleared by the [place] FDA and has been authorized for detection and/or diagnosis of SARS-CoV-2 by FDA under an Emergency Use Authorization (EUA). This EUA will remain in effect (meaning this test can be used) for the duration of the COVID-19 declaration under Section 564(b)(1) of the Act, 21 U.S.C. section 360bbb-3(b)(1), unless the authorization is terminated or revoked.  Performed at [facility], [address] [place], [place] [address]          Radiology Studies: No results found.    Scheduled Meds:  apixaban  5 mg Oral BID   Chlorhexidine Gluconate Cloth  6 each Topical Daily   docusate sodium  100 mg Oral Daily   fentaNYL  1 patch Transdermal Q72H   gabapentin  600 mg Oral TID   influenza vaccine adjuvanted  0.5 mL Intramuscular Tomorrow-1000   methylPREDNISolone (SOLU-MEDROL) injection  40 mg Intravenous BID   metoprolol tartrate  25 mg Oral BID   mometasone-formoterol  2 puff Inhalation BID   multivitamin with minerals  1 tablet Oral Daily   oxcarbazepine  600 mg Oral BID   sodium chloride flush  10-40 mL Intracatheter Q12H   Continuous Infusions:  azithromycin 500 mg (08/02/21 1220)   cefTRIAXone (ROCEPHIN)  IV 2 g (08/01/21 1736)     LOS: 3 days      Debbe Odea, MD [facility] Pager: [email or web address] 08/02/2021, 1:14 PM

## 2021-08-02 NOTE — Evaluation (Signed)
Physical Therapy Evaluation Patient Details Name: Shannon Schroeder MRN: [id number] DOB: 1956/02/25 Today's Date: 08/02/2021  History of Present Illness  Pt is a 65 y/o F admitted on 07/30/21 after presenting with c/o worsening malaise. CT chest done showing multifocal GGO centrally predominant with generous PA diameter. Pt is being treated for Bilateral multifocal pneumonia. PMH: AF on chronic anticoagulation therapy, hx of head & neck CA on chemo & immunotherapy, HTN, aneurysm of anterior cerebral artery, anxiety, arthritis, bipolar, COPD, seizures  Clinical Impression  Pt seen for PT evaluation with pt agreeable but self limiting once in the recliner as pt reports she is cold. Pt is able to complete bed mobility with supervision & transfer to recliner with close supervision<>CGA with RW. Pt endorses dizziness upon initial standing but after sitting & once checking orthostatics pt denies dizziness. Pt also noted to require additional O2 for mobility. Nurse made aware of vitals. Pt deferred additional mobility but is strongly against going to SNF upon d/c, as pt reports her friend/roommate can physically carry her up the stairs to access the house if necessary. Pt would benefit from additional PT to address gait, endurance, balance & stair negotiation.     Pt received on room air, SpO2 95%. SpO2 dropped to 87% with standing/transfers so pt placed on 1L with improvement >90%.  Sitting in recliner BP in RUE: 165/88 mmHg MAP 111 Standing: 171/94 mmHg MAP 120   Recommendations for follow up therapy are one component of a multi-disciplinary discharge planning process, led by the attending physician.  Recommendations may be updated based on patient status, additional functional criteria and insurance authorization.  Follow Up Recommendations Home health PT;Supervision for mobility/OOB    Equipment Recommendations  3in1 (PT);Wheelchair (measurements PT);Wheelchair cushion (measurements PT)     Recommendations for Other Services       Precautions / Restrictions Precautions Precautions: Fall Restrictions Weight Bearing Restrictions: No      Mobility  Bed Mobility Overal bed mobility: Needs Assistance Bed Mobility: Supine to Sit     Supine to sit: Supervision;HOB elevated          Transfers Overall transfer level: Needs assistance Equipment used: Rolling walker (2 wheeled) Transfers: Sit to/from [facility] Sit to Stand: Min guard;Supervision Stand pivot transfers: Min guard;Supervision          Ambulation/Gait                Stairs            Wheelchair Mobility    Modified Rankin (Stroke Patients Only)       Balance Overall balance assessment: Needs assistance Sitting-balance support: Feet supported;Bilateral upper extremity supported Sitting balance-Leahy Scale: Good     Standing balance support: During functional activity;No upper extremity supported Standing balance-Leahy Scale: Poor Standing balance comment: requires UE support for static standing balance                             Pertinent Vitals/Pain Pain Assessment: No/denies pain    Home Living Family/patient expects to be discharged to:: Private residence Living Arrangements: Non-relatives/Friends Available Help at Discharge: Family Type of Home: House Home Access: Stairs to enter Entrance Stairs-Rails: Right;Left;Can reach both Technical brewer of Steps: 3 Home Layout: One level Home Equipment: Environmental consultant - 2 wheels      Prior Function Level of Independence: Needs assistance   Gait / Transfers Assistance Needed: Pt ambulates with RW with PRN assistance from roommates/friends.  Pt reports her roommate can carry her up/down steps to enter house if necessary.           Hand Dominance        Extremity/Trunk Assessment   Upper Extremity Assessment Upper Extremity Assessment: Generalized weakness    Lower Extremity  Assessment Lower Extremity Assessment: Generalized weakness    Cervical / Trunk Assessment Cervical / Trunk Assessment: Kyphotic  Communication      Cognition Arousal/Alertness: Awake/alert Behavior During Therapy: WFL for tasks assessed/performed Overall Cognitive Status: Within Functional Limits for tasks assessed                                        General Comments      Exercises     Assessment/Plan    PT Assessment Patient needs continued PT services  PT Problem List Decreased strength;Decreased mobility;Decreased activity tolerance;Cardiopulmonary status limiting activity;Decreased balance       PT Treatment Interventions DME instruction;Therapeutic exercise;Wheelchair mobility training;Gait training;Balance training;Neuromuscular re-education;Stair training;Functional mobility training;Therapeutic activities;Patient/family education    PT Goals (Current goals can be found in the Care Plan section)  Acute Rehab PT Goals Patient Stated Goal: go home PT Goal Formulation: With patient Time For Goal Achievement: 08/16/21 Potential to Achieve Goals: Good    Frequency Min 2X/week   Barriers to discharge Inaccessible home environment 3 steps to enter home    Co-evaluation               AM-PAC PT "6 Clicks" Mobility  Outcome Measure Help needed turning from your back to your side while in a flat bed without using bedrails?: None Help needed moving from lying on your back to sitting on the side of a flat bed without using bedrails?: None Help needed moving to and from a bed to a chair (including a wheelchair)?: A Little Help needed standing up from a chair using your arms (e.g., wheelchair or bedside chair)?: A Little Help needed to walk in hospital room?: A Little Help needed climbing 3-5 steps with a railing? : A Lot 6 Click Score: 19    End of Session Equipment Utilized During Treatment: Oxygen;Gait belt Activity Tolerance: Other  (comment) (pt self limiting) Patient left: with chair alarm set;in chair;with call bell/phone within reach Nurse Communication: Mobility status (O2) PT Visit Diagnosis: Muscle weakness (generalized) (M62.81);Difficulty in walking, not elsewhere classified (R26.2);Unsteadiness on feet (R26.81)    Time: 0263-7858 PT Time Calculation (min) (ACUTE ONLY): 18 min   Charges:   PT Evaluation $PT Eval Moderate Complexity: [place], PT, DPT 08/02/21, 12:28 PM   Waunita Schooner 08/02/2021, 12:21 PM

## 2021-08-02 NOTE — Progress Notes (Signed)
Pulmonary Medicine          Date: 08/02/2021,   MRN# [id number] Shannon Schroeder 16-Jun-1956     AdmissionWeight: 57.3 kg                 CurrentWeight: 57.3 kg   Referring physician: Dr Raul Del    CHIEF COMPLAINT:   Multifocal ground glass infiltrates of bilateral lungs   HISTORY OF PRESENT ILLNESS    This is a 65 y.o. female with medical history significant for AF on chronic anticoagulation therapy, history of head and neck cancer on chemotherapy and immunotherapy, history of hypertension who presents to the emergency room for evaluation of worsening malaise. Currently on chemo per med-onc Dr Tasia Catchings.  Reports NV symptoms post chemo.  She has chronic dysphagia.  Reports over last week worsening dyspnea on exertion . Initial labs were unremarkable except for chronic anemia.  RVP was negative.  CT chest done showing multifocal GGO centrally predominant with generous PA diameter. PCCM consult for further evaluation and treatment.   08/02/21- patient improved clinically.  She reports less dyspnea. She is developing crackles at bases bilaterally, will dc IVF NS 183ml/h patient is euvolemic.  Reducing steroids to 40 bid solumedrol IV.  PT/OT as ordered. CBC w diff repeat today due to hemoptysis with reduced h/h.    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    Anxiety    Arthritis    Bipolar disorder (HCC)    COPD (chronic obstructive pulmonary disease) (HCC)    NO INHALERS   Depression    Dysrhythmia    H/O V TACH   Head and neck cancer ([place]) 07/29/2020   Headache    H/O MIGRAINES   Hypertension    Seizures ([place])    X1 AFTER FALL     SURGICAL HISTORY   Past Surgical History:  Procedure Laterality Date   ABDOMINAL HYSTERECTOMY     partial   BREAST SURGERY     biopsy   CARDIAC CATHETERIZATION     X 2   CARPAL TUNNEL RELEASE Right    CARPECTOMY HAND  Right    CATARACT EXTRACTION W/PHACO Left 03/08/2018   Procedure: CATARACT EXTRACTION PHACO AND INTRAOCULAR LENS PLACEMENT ([place]);  Surgeon: Birder Robson, MD;  Location: [facility] ORS;  Service: Ophthalmology;  Laterality: Left;  [place] 00:55 AP% 13.5 CDE 7.52 Fluid pack lot # [id number]   CATARACT EXTRACTION W/PHACO Right 04/05/2018   Procedure: CATARACT EXTRACTION PHACO AND INTRAOCULAR LENS PLACEMENT ([facility]);  Surgeon: Birder Robson, MD;  Location: [facility] ORS;  Service: Ophthalmology;  Laterality: Right;  [place] 00:36 AP% 15.9 CDE 5.72 Fluid pack lot # [id number]   CEREBRAL ANEURYSM REPAIR     CHOLECYSTECTOMY     COLONOSCOPY WITH PROPOFOL N/A 04/26/2019   Procedure: COLONOSCOPY WITH PROPOFOL;  Surgeon: Jonathon Bellows, MD;  Location: [facility] ENDOSCOPY;  Service: Gastroenterology;  Laterality: N/A;   PORTACATH PLACEMENT Left 08/05/2020   Procedure: INSERTION PORT-A-CATH;  Surgeon: Herbert Pun, MD;  Location: [facility] ORS;  Service: General;  Laterality: Left;   TUBAL LIGATION       FAMILY HISTORY   Family History  Problem Relation Age of Onset   Hypertension Mother    Heart failure Mother    Hypertension Brother    Stroke Brother    Hypertension Son    Emphysema Maternal Aunt    Hypertension Paternal  Aunt    Hypertension Paternal Uncle    Hypertension Maternal Grandmother    Hypertension Paternal Grandmother      SOCIAL HISTORY   Social History   Tobacco Use   Smoking status: Every Day    Packs/day: 0.25    Years: 50.00    Pack years: 12.50    Types: Cigarettes   Smokeless tobacco: Never  Vaping Use   Vaping Use: Never used  Substance Use Topics   Alcohol use: No   Drug use: No     MEDICATIONS    Home Medication:    Current Medication:  Current Facility-Administered Medications:    0.9 % NaCl with KCl 40 mEq / L  infusion, , Intravenous, Continuous, Rizwan, Saima, MD, Last Rate: 100 mL/hr at 08/02/21 0316, New Bag at 08/02/21 0316   acetaminophen (TYLENOL) suppository  650 mg, 650 mg, Rectal, Q4H PRN, Sharion Settler, NP, 650 mg at 07/31/21 2055   apixaban (ELIQUIS) tablet 5 mg, 5 mg, Oral, BID, Agbata, Tochukwu, MD, 5 mg at 08/01/21 2150   azithromycin (ZITHROMAX) 500 mg in sodium chloride 0.9 % 250 mL IVPB, 500 mg, Intravenous, Daily, Agbata, Tochukwu, MD, Last Rate: 250 mL/hr at 08/01/21 1053, 500 mg at 08/01/21 1053   cefTRIAXone (ROCEPHIN) 2 g in sodium chloride 0.9 % 100 mL IVPB, 2 g, Intravenous, q1800, Agbata, Tochukwu, MD, Last Rate: 200 mL/hr at 08/01/21 1736, 2 g at 08/01/21 1736   Chlorhexidine Gluconate Cloth 2 % PADS 6 each, 6 each, Topical, Daily, Rizwan, Eunice Blase, MD, 6 each at 08/01/21 1112   docusate sodium (COLACE) capsule 100 mg, 100 mg, Oral, Daily, Agbata, Tochukwu, MD, 100 mg at 08/01/21 1055   fentaNYL (DURAGESIC) 75 MCG/HR 1 patch, 1 patch, Transdermal, Q72H, Agbata, Tochukwu, MD   gabapentin (NEURONTIN) capsule 600 mg, 600 mg, Oral, TID, Agbata, Tochukwu, MD, 600 mg at 08/01/21 2150   influenza vaccine adjuvanted (FLUAD) injection 0.5 mL, 0.5 mL, Intramuscular, Tomorrow-1000, Agbata, Tochukwu, MD   ipratropium-albuterol (DUONEB) 0.5-2.5 (3) MG/3ML nebulizer solution 3 mL, 3 mL, Nebulization, Q6H PRN, Agbata, Tochukwu, MD   melatonin tablet 10 mg, 10 mg, Oral, QHS PRN, Agbata, Tochukwu, MD   methylPREDNISolone sodium succinate (SOLU-MEDROL) 125 mg/2 mL injection 60 mg, 60 mg, Intravenous, BID, Lanney Gins, Dorr Perrot, MD, 60 mg at 08/01/21 2149   metoprolol tartrate (LOPRESSOR) tablet 25 mg, 25 mg, Oral, BID, Rizwan, Saima, MD, 25 mg at 08/01/21 2149   mometasone-formoterol (DULERA) 100-5 MCG/ACT inhaler 2 puff, 2 puff, Inhalation, BID, Agbata, Tochukwu, MD, 2 puff at 08/01/21 2151   multivitamin with minerals tablet 1 tablet, 1 tablet, Oral, Daily, Agbata, Tochukwu, MD, 1 tablet at 07/30/21 1216   Oxcarbazepine (TRILEPTAL) tablet 600 mg, 600 mg, Oral, BID, Agbata, Tochukwu, MD, 600 mg at 08/01/21 2149   oxyCODONE (Oxy IR/ROXICODONE) immediate  release tablet 10-20 mg, 10-20 mg, Oral, Q4H PRN, Nazari, Walid A, RPH   polyethylene glycol (MIRALAX / GLYCOLAX) packet 17 g, 17 g, Oral, Daily PRN, Agbata, Tochukwu, MD   sodium chloride flush (NS) 0.9 % injection 10-40 mL, 10-40 mL, Intracatheter, Q12H, Rizwan, Saima, MD, 10 mL at 08/01/21 2152   sodium chloride flush (NS) 0.9 % injection 10-40 mL, 10-40 mL, Intracatheter, PRN, Debbe Odea, MD   traZODone (DESYREL) tablet 50 mg, 50 mg, Oral, QHS PRN, Rito Ehrlich A, RPH, 50 mg at 08/01/21 2156  Facility-Administered Medications Ordered in Other Encounters:    heparin lock flush 100 UNIT/ML injection, , , ,    heparin lock flush  100 UNIT/ML injection, , , ,    heparin lock flush 100 unit/mL, 500 Units, Intravenous, Once, Earlie Server, MD   heparin lock flush 100 unit/mL, 500 Units, Intracatheter, Once PRN, Earlie Server, MD   sodium chloride flush (NS) 0.9 % injection 10 mL, 10 mL, Intravenous, PRN, Earlie Server, MD, 10 mL at 10/10/20 1040   sodium chloride flush (NS) 0.9 % injection 10 mL, 10 mL, Intravenous, PRN, Earlie Server, MD, 10 mL at 11/13/20 0836   sodium chloride flush (NS) 0.9 % injection 10 mL, 10 mL, Intravenous, PRN, Earlie Server, MD, 10 mL at 12/30/20 0998   sodium chloride flush (NS) 0.9 % injection 10 mL, 10 mL, Intravenous, PRN, Earlie Server, MD, 10 mL at 01/14/21 3382   sodium chloride flush (NS) 0.9 % injection 10 mL, 10 mL, Intravenous, PRN, Earlie Server, MD, 10 mL at 06/11/21 0815   sodium chloride flush (NS) 0.9 % injection 10 mL, 10 mL, Intravenous, PRN, Earlie Server, MD, 10 mL at 07/09/21 5053    ALLERGIES   Naproxen, Aspirin, Belladonna alkaloids, Fluoxetine, Fluoxetine hcl, Paroxetine hcl, Phenobarbital, and Prozac [fluoxetine hcl]     REVIEW OF SYSTEMS    Review of Systems:  Gen:  Denies  fever, sweats, chills weigh loss  HEENT: Denies blurred vision, double vision, ear pain, eye pain, hearing loss, nose bleeds, sore throat Cardiac:  No dizziness, chest pain or heaviness, chest  tightness,edema Resp:   Denies cough or sputum porduction, shortness of breath,wheezing, hemoptysis,  Gi: Denies swallowing difficulty, stomach pain, nausea or vomiting, diarrhea, constipation, bowel incontinence Gu:  Denies bladder incontinence, burning urine Ext:   Denies Joint pain, stiffness or swelling Skin: Denies  skin rash, easy bruising or bleeding or hives Endoc:  Denies polyuria, polydipsia , polyphagia or weight change Psych:   Denies depression, insomnia or hallucinations   Other:  All other systems negative   VS: BP 133/83 (BP Location: Right Arm)   Pulse 65   Temp 97.7 F (36.5 C)   Resp 16   Ht 5\' 6"  (1.676 m)   Wt 57.3 kg   LMP 11/19/1988 (Exact Date)   SpO2 95%   BMI 20.39 kg/m      PHYSICAL EXAM    GENERAL:NAD, no fevers, chills, no weakness no fatigue HEAD: Normocephalic, atraumatic.  EYES: Pupils equal, round, reactive to light. Extraocular muscles intact. No scleral icterus.  MOUTH: Moist mucosal membrane. Dentition intact. No abscess noted.  EAR, NOSE, THROAT: Clear without exudates. No external lesions.  NECK: Supple. No thyromegaly. No nodules. No JVD.  PULMONARY: Diffuse coarse rhonchi right sided +wheezes CARDIOVASCULAR: S1 and S2. Regular rate and rhythm. No murmurs, rubs, or gallops. No edema. Pedal pulses 2+ bilaterally.  GASTROINTESTINAL: Soft, nontender, nondistended. No masses. Positive bowel sounds. No hepatosplenomegaly.  MUSCULOSKELETAL: No swelling, clubbing, or edema. Range of motion full in all extremities.  NEUROLOGIC: Cranial nerves II through XII are intact. No gross focal neurological deficits. Sensation intact. Reflexes intact.  SKIN: No ulceration, lesions, rashes, or cyanosis. Skin warm and dry. Turgor intact.  PSYCHIATRIC: Mood, affect within normal limits. The patient is awake, alert and oriented x 3. Insight, judgment intact.       IMAGING    CT CHEST ABDOMEN PELVIS W CONTRAST  Result Date: 07/30/2021 CLINICAL DATA:   65 year old female with history of multilobar pneumonia on chest x-ray and abdominal pain. 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 Possible sepsis. EXAM: CT CHEST, ABDOMEN, AND PELVIS WITH CONTRAST TECHNIQUE: Multidetector CT  imaging of the chest, abdomen and pelvis was performed following the standard protocol during bolus administration of intravenous contrast. CONTRAST:  38mL OMNIPAQUE IOHEXOL 350 MG/ML SOLN COMPARISON:  PET-CT 07/08/2021. FINDINGS: CT CHEST FINDINGS Cardiovascular: Heart size is normal. There is no significant pericardial fluid, thickening or pericardial calcification. Aortic atherosclerosis. No definite coronary artery calcifications. Left internal jugular single-lumen porta cath with tip terminating in the distal superior vena cava. Mediastinum/Nodes: Multiple prominent but nonenlarged mediastinal lymph nodes are nonspecific. No definite pathologic lymphadenopathy identified in mediastinal or hilar nodal stations. Esophagus is unremarkable in appearance. No axillary lymphadenopathy. Lungs/Pleura: Worsening patchy multifocal areas of ground-glass attenuation, septal thickening and thickening of the peribronchovascular interstitium is seen throughout the lungs bilaterally with no discernible craniocaudal gradient. No confluent consolidative airspace disease. No pleural effusions. No definite suspicious appearing pulmonary nodules or masses are noted. Musculoskeletal: There are no aggressive appearing lytic or blastic lesions noted in the visualized portions of the skeleton. CT ABDOMEN PELVIS FINDINGS Hepatobiliary: No suspicious cystic or solid hepatic lesions. No intra or extrahepatic biliary ductal dilatation. Status post cholecystectomy. Pancreas: No pancreatic mass. No pancreatic ductal dilatation. No pancreatic or peripancreatic fluid collections or inflammatory changes. Spleen: Unremarkable. Adrenals/Urinary Tract: Bilateral kidneys and adrenal glands are normal in appearance. No  hydroureteronephrosis. Urinary bladder is normal in appearance. Stomach/Bowel: The appearance of the stomach is normal. There is no pathologic dilatation of small bowel or colon. Normal appendix. Vascular/Lymphatic: Aortic atherosclerosis, without evidence of aneurysm or dissection in the abdominal or pelvic vasculature. No lymphadenopathy noted in the abdomen or pelvis. Reproductive: Status post hysterectomy. Ovaries are not confidently identified may be surgically absent or atrophic. Other: No significant volume of ascites.  No pneumoperitoneum. Musculoskeletal: Chronic compression fracture of anterior aspect of the superior endplate of L1, similar to the prior study from 05/01/2021 with minimal loss of anterior vertebral body height. There are no aggressive appearing lytic or blastic lesions noted in the visualized portions of the skeleton. IMPRESSION: 1. Marked worsening multifocal ground-glass attenuation and septal thickening in the lungs bilaterally when compared to prior PET-CT 07/08/2021. Findings are favored to reflect a drug-induced pneumonitis, but could alternatively reflect multilobar bilateral pneumonia from atypical infection. Further clinical evaluation is recommended. 2. No acute findings are noted in the abdomen or pelvis. 3. No definite signs of metastatic disease in the chest, abdomen or pelvis. 4. Aortic atherosclerosis. 5. Additional incidental findings, as above. Electronically Signed   By: Vinnie Langton M.D.   On: 07/30/2021 06:53   NM PET Image Restag (PS) Skull Base To Thigh  Result Date: 07/09/2021 CLINICAL DATA:  Subsequent treatment strategy for head/neck cancer. EXAM: NUCLEAR MEDICINE PET SKULL BASE TO THIGH TECHNIQUE: 6.1 mCi F-18 FDG was injected intravenously. Full-ring PET imaging was performed from the skull base to thigh after the radiotracer. CT data was obtained and used for attenuation correction and anatomic localization. Fasting blood glucose: 91 mg/dl COMPARISON:  CT  abdomen 05/01/2021 and PET 01/09/2021. CT chest 09/26/2020. 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 No hypermetabolic lymph nodes. Incidental CT findings: Associated osseous destruction surrounding the right maxillary sinus. CHEST: No hypermetabolic mediastinal, hilar or axillary lymph nodes. No hypermetabolic pulmonary nodules. Incidental CT findings: Left IJ Port-A-Cath terminates in the SVC. Heart is at the upper limits of normal in  size. No pericardial or pleural effusion. Basilar predominant peribronchovascular ground-glass and mild septal thickening. ABDOMEN/PELVIS: No abnormal hypermetabolism in the liver, adrenal glands, spleen or pancreas. No hypermetabolic lymph nodes. Incidental CT findings: Liver is unremarkable. Cholecystectomy. Adrenal glands, kidneys, spleen, pancreas, stomach and bowel are grossly unremarkable. Atherosclerotic calcification of the aorta. SKELETON: No abnormal osseous hypermetabolism. Very mild hypermetabolism associated with a fracture of the superior endplate of L1 is new from 01/09/2021. Incidental CT findings: Degenerative changes in the spine. IMPRESSION: 1. Continued response to therapy as evidenced by decreased wall thickening and hypermetabolism associated with a cavitary right maxillary/nasopharyngeal mass. No evidence of distant metastatic disease. 2. Subacute compression of the L1 superior endplate, new from 01/09/2021. 3.  Aortic atherosclerosis (ICD10-I70.0). Electronically Signed   By: Lorin Picket M.D.   On: 07/09/2021 13:28   DG Chest Port 1 View  Result Date: 07/29/2021 CLINICAL DATA:  65 year old female with history of cough and weakness for the past several days. EXAM: PORTABLE CHEST 1 VIEW COMPARISON:  Chest x-ray  12/30/2020. FINDINGS: Left-sided internal jugular single-lumen porta cath with tip terminating in the distal superior vena cava. Lung volumes are normal. Diffuse interstitial prominence and peribronchial cuffing. No confluent consolidative airspace disease. No pleural effusions. No pneumothorax. No pulmonary nodule or mass noted. Pulmonary vasculature and the cardiomediastinal silhouette are within normal limits. Atherosclerotic calcifications in the thoracic aorta. IMPRESSION: 1. The appearance the chest suggests severe bronchitis, potentially with developing multilobar bronchopneumonia. 2. Aortic atherosclerosis. Electronically Signed   By: Vinnie Langton M.D.   On: 07/29/2021 19:39      ASSESSMENT/PLAN   Bilateral multifocal pneumonia -suspect this is due to [place] however patient reports hemoptysis bright red blood and has significant decrement in hemoglobin.  Possible Alevolar hemorrhage in context of pnemonitis with concomitant anticoagulation.  Although she is on IV fluids remainder of cell lines are not proportionally reduced which suggests blood loss.  I agree with steroids.  She is improved already and should be better in 48 hours with steroids.  Agree with empiric abx.  She has received only 2 doses of Keytruda and I would suggest trying to give this again next month after improvement. She has dysphagia and higher risk for aspiration. Noted s/p SLP. Will reduce solumedrol to 60 bid today.   Per primary team AF-metoprolol COPD-dulera Head/Neck CA- per med onc Dr Tasia Catchings   Thank you for allowing me to participate in the care of this patient.   Patient/Family are satisfied with care plan and all questions have been answered.  This document was prepared using Dragon voice recognition software and may include unintentional dictation errors.     Ottie Glazier, M.D.  Division of [facility]

## 2021-08-03 ENCOUNTER — Encounter: Payer: Self-pay | Admitting: Internal Medicine

## 2021-08-03 ENCOUNTER — Inpatient Hospital Stay: Payer: Medicare Other

## 2021-08-03 DIAGNOSIS — J9601 Acute respiratory failure with hypoxia: Secondary | ICD-10-CM | POA: Diagnosis not present

## 2021-08-03 LAB — SEDIMENTATION RATE: Sed Rate: 79 mm/hr — ABNORMAL HIGH (ref 0–30)

## 2021-08-03 LAB — C-REACTIVE PROTEIN: CRP: 9.1 mg/dL — ABNORMAL HIGH (ref <1.0)

## 2021-08-03 LAB — CRYPTOCOCCAL ANTIGEN: Crypto Ag: NEGATIVE

## 2021-08-03 LAB — FERRITIN: Ferritin: 72 ng/mL (ref 11–307)

## 2021-08-03 IMAGING — DX DG CHEST 1V PORT
1 series · 1 of 1 positions shown · non-contrast
Comparison: [DATE]

CLINICAL DATA: Pneumonitis.  History of cancer.

EXAM:
PORTABLE CHEST 1 VIEW

[chest ap]
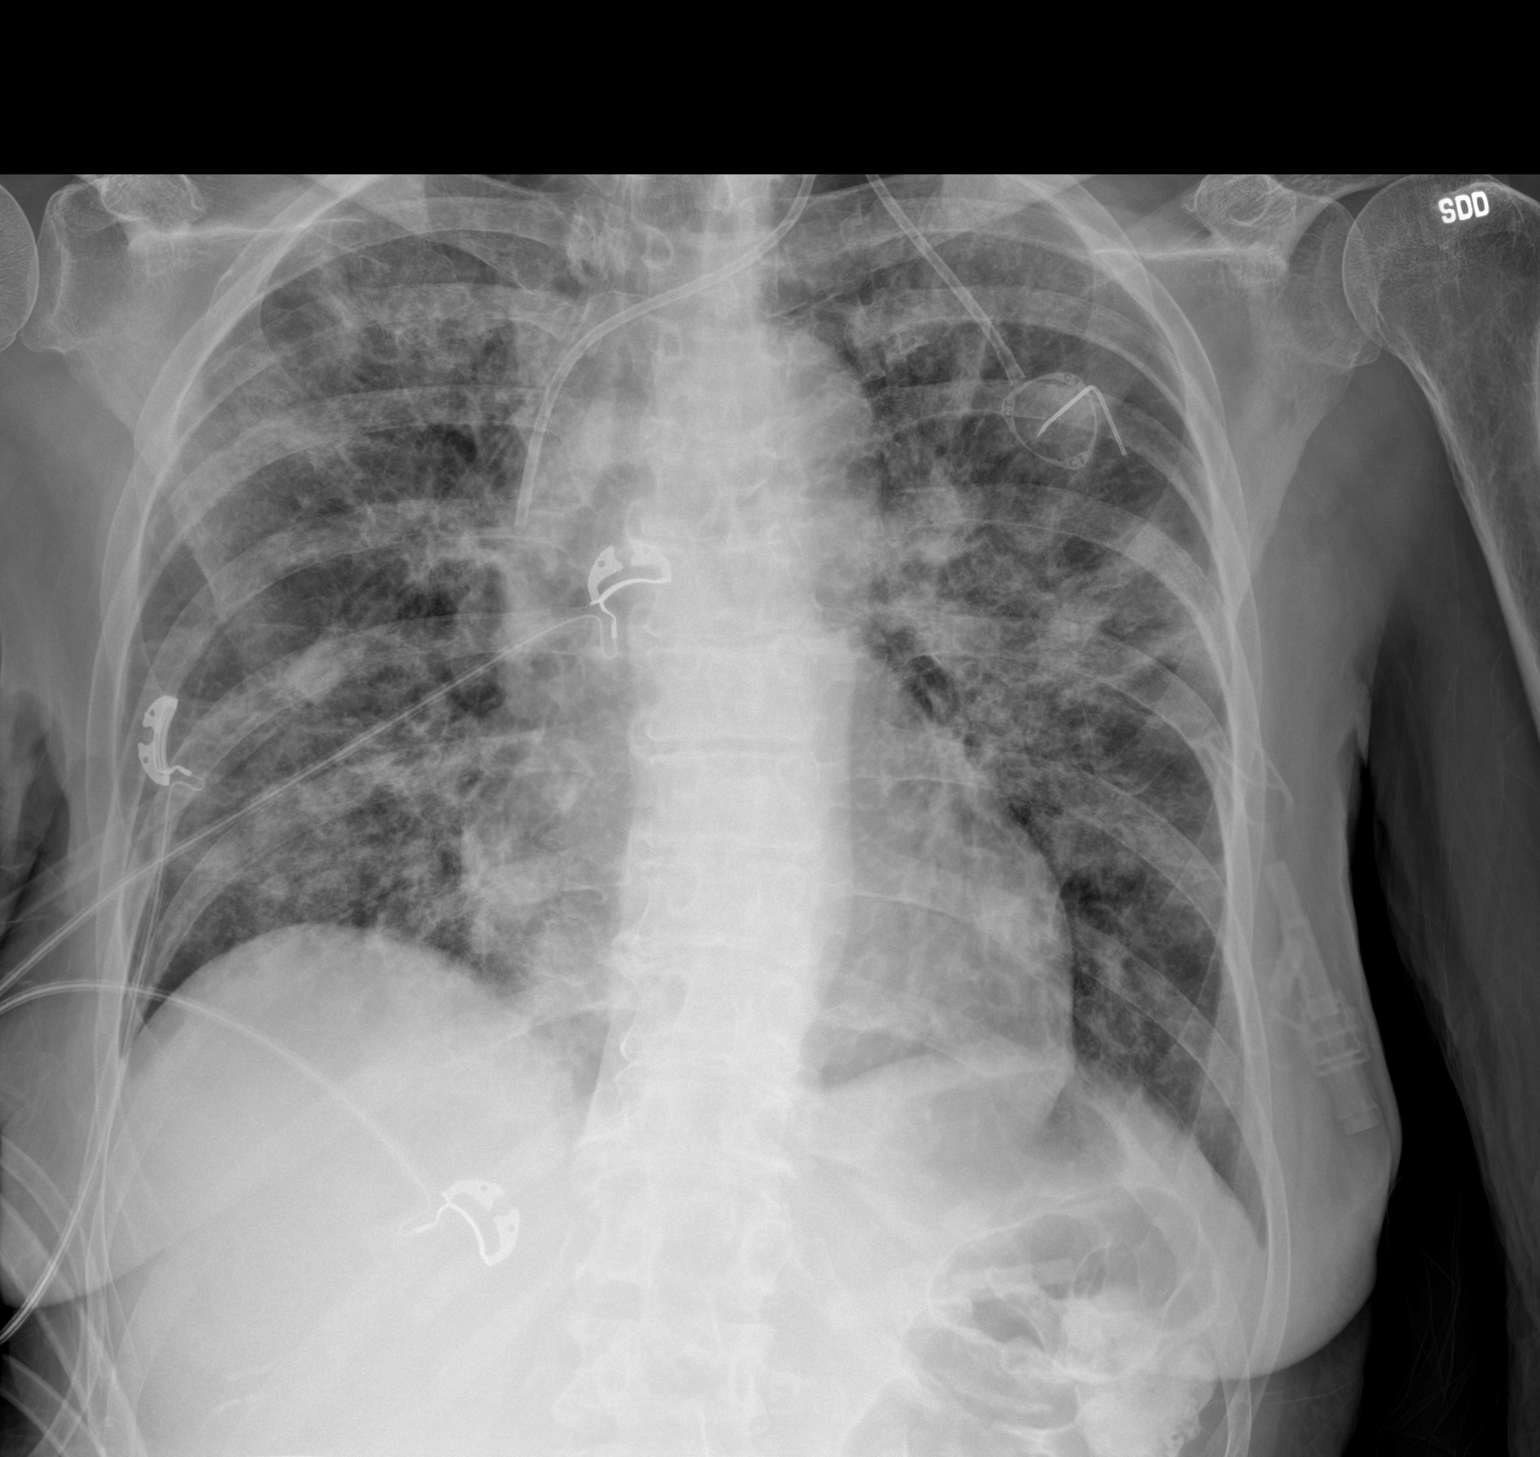

[1 of 1 positions shown; findings below may reference images not displayed]

FINDINGS: Diffuse bilateral patchy pulmonary infiltrates. Stable left
Port-A-Cath. No pneumothorax. No change in the cardiomediastinal
silhouette.
IMPRESSION: New diffuse bilateral pulmonary patchy infiltrates. The findings are
most consistent with multifocal pneumonia. Edema considered less
likely.

## 2021-08-03 NOTE — Progress Notes (Signed)
PROGRESS NOTE    Shannon Schroeder   YIF:[id number]  DOB: 06-16-1956  DOA: 07/30/2021 PCP: Merryl Hacker, No   Brief Narrative:  Shannon Britain Ainsworthis a 65 y.o. 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  She was diagnosed with head and neck cancer in 2021 and is currently receiving Keytruda and Cetuximab.   She presents to the hospital for complaints of nausea, vomiting and dry heaves which started after her last dose of cancer treatment on 07/23/2021.  She admits to shortness of breath with exertion. Chest x-ray reveals diffuse interstitial prominence and peribronchial cuffing. CT chest abdomen pelvis with contrast reveals: Multifocal groundglass attenuation and septal thickening in the lungs bilaterally which are worse when compared to PET/CT on 07/08/2021 - No definite signs of metastatic disease in the chest abdomen pelvis  In the ED, she was started on antibiotics: Vancomycin, azithromycin and cefepime   Subjective: She is drinking fluids well. She has no complaints today. We have discussed the possibility of discharge tomorrow and she is ok with this.   Assessment & Plan:   Principal Problem:   Acute respiratory failure with hypoxemia (HCC) and hemoptysis  -Currently receiving ceftriaxone and azithromycin -May be related to aspiration however she is on 3 medications that can cause pneumonitis including Keytruda, Cetuximab and Amiodarone -It is to be noted that the patient has not had any fevers, sweats or chills and does not have any leukocytosis - plan to continue Ceftriaxone and Azithromycin x 5 days- today is the last day - IV steroids started to treat for drug induced pneumonitis  - s/p MBS, she has been started on liquid diet (she takes pureed and  pudding thick liquids at baseline) - Interveral worsening of infiltrated on CXR today- agree with stopping IVF   Problems: Anemia, normocytic - acute drop in Hgb noted from 10.6 to 8.7 on 9/29- Hgb has been stable since - patient had hemoptysis and was receiving IVF- partly dilutional and partly due to hemoptysis  Thrombocytosis, chronic - cont to follow as outpt  COPD / nicotine abuse - Continue nebulizer treatments  Nausea and vomiting - Status post treatment for cancer - has resolved  Hypokalemia - has been replaced  Right sided Maxillary cancer, squamous cell invasive cancer Generalized weakness with weight loss -She initially presented with a large right facial mass eroding into the maxillary area and multiple branches of the maxillary division of the right trigeminal nerve - Status post multiple cycles of chemotherapy-developed hypersensitivity to carboplatin in 5/22 -Receiving treatment by Dr. 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   Difficult to place patient No      Consultants:  Oncology Palliative care Procedures:   none Antimicrobials:  Anti-infectives (From admission, onward)    Start     Dose/Rate Route Frequency Ordered Stop   07/30/21 1800  cefTRIAXone (ROCEPHIN) 2 g in sodium chloride 0.9 % 100 mL IVPB        2 g 200 mL/hr over 30 Minutes Intravenous Daily-1800 07/30/21 1126 08/04/21 1759   07/30/21 1145  azithromycin (ZITHROMAX) 500 mg in sodium chloride 0.9 % 250 mL IVPB        500 mg 250 mL/hr over 60 Minutes Intravenous Daily 07/30/21 1126 08/03/21 1205   07/30/21 0600  vancomycin (VANCOREADY) IVPB 1250 mg/250 mL        1,250 mg 166.7 mL/hr over 90 Minutes Intravenous  Once 07/30/21 0557 07/30/21 1019   07/30/21 0545  ceFEPIme (MAXIPIME) 1 g in sodium chloride 0.9 % 100 mL IVPB        1 g 200 mL/hr over 30 Minutes Intravenous  Once 07/30/21 0540 07/30/21 0753        Objective: Vitals:   08/02/21 2358 08/03/21 0404 08/03/21 0827 08/03/21 1211  BP: 125/77 (!) 119/57 125/82 128/72  Pulse: 94 72 69 85  Resp: 18 18 18 16   Temp: 97.8 F (36.6 C) (!) 97.5 F (36.4 C) 98 F (36.7 C) 97.7 F (36.5 C)  TempSrc: Oral Oral  Oral  SpO2: 92% 97% 97% 97%  Weight:      Height:        Intake/Output Summary (Last 24 hours) at 08/03/2021 1323 Last data filed at 08/03/2021 1214 Gross per 24 hour  Intake --  Output 700 ml  Net -700 ml    Filed Weights   07/31/21 0313  Weight: 57.3 kg    Examination: General exam: Appears comfortable  HEENT: PERRLA, oral mucosa moist, no sclera icterus or thrush Respiratory system: crackles at lung basesRespiratory effort normal. Cardiovascular system: S1 & S2 heard, regular rate and rhythm Gastrointestinal system: Abdomen soft, non-tender, nondistended. Normal bowel sounds   Central nervous system: Alert and oriented. No focal neurological deficits. Extremities: No cyanosis, clubbing or edema Skin: No rashes or ulcers Psychiatry:  Mood & affect appropriate.      Data Reviewed: I have personally reviewed following labs and imaging  studies  CBC: Recent Labs  Lab 07/29/21 2030 07/31/21 0600 08/02/21 1141  WBC 10.2 7.9 11.2*  NEUTROABS  --   --  9.8*  HGB 10.6* 8.7* 8.6*  HCT 32.0* 27.5* 26.6*  MCV 91.7 92.3 93.7  PLT 512* 494* 610*    Basic Metabolic Panel: Recent Labs  Lab 07/29/21 2030 07/30/21 0551 07/31/21 0600 08/01/21 0749  NA 134*  --  137 141  K 3.2*  --  3.0* 3.9  CL 93*  --  99 105  CO2 27  --  26 21*  GLUCOSE 103*  --  77 95  BUN 11  --  11 18  CREATININE 0.46  --  0.40* 0.33*  CALCIUM 8.5*  --  8.0* 7.9*  MG  --  1.8  --   --     GFR: Estimated Creatinine Clearance: 62.6 mL/min (A) (by C-G formula based on SCr of 0.33 mg/dL (L)). Liver Function Tests: Recent Labs  Lab 07/29/21 2030  AST  21  ALT 21  ALKPHOS 216*  BILITOT 0.5  PROT 7.4  ALBUMIN 2.6*    No results for input(s): LIPASE, AMYLASE in the last 168 hours. No results for input(s): AMMONIA in the last 168 hours. Coagulation Profile: No results for input(s): INR, PROTIME in the last 168 hours. Cardiac Enzymes: No results for input(s): CKTOTAL, CKMB, CKMBINDEX, TROPONINI in the last 168 hours. BNP (last 3 results) No results for input(s): PROBNP in the last 8760 hours. HbA1C: No results for input(s): HGBA1C in the last 72 hours. CBG: No results for input(s): GLUCAP in the last 168 hours. Lipid Profile: No results for input(s): CHOL, HDL, LDLCALC, TRIG, CHOLHDL, LDLDIRECT in the last 72 hours. Thyroid Function Tests: No results for input(s): TSH, T4TOTAL, FREET4, T3FREE, THYROIDAB in the last 72 hours. Anemia Panel: No results for input(s): VITAMINB12, FOLATE, FERRITIN, TIBC, IRON, RETICCTPCT in the last 72 hours. Urine analysis:    Component Value Date/Time   COLORURINE AMBER (A) 07/30/2021 0057   APPEARANCEUR CLOUDY (A) 07/30/2021 0057   APPEARANCEUR Clear 12/21/2018 1041   LABSPEC 1.032 (H) 07/30/2021 0057   LABSPEC 1.017 04/23/2015 0000   PHURINE 6.0 07/30/2021 0057   GLUCOSEU NEGATIVE 07/30/2021 0057    HGBUR NEGATIVE 07/30/2021 0057   BILIRUBINUR SMALL (A) 07/30/2021 0057   BILIRUBINUR Negative 12/21/2018 1041   KETONESUR 20 (A) 07/30/2021 0057   PROTEINUR 100 (A) 07/30/2021 0057   NITRITE NEGATIVE 07/30/2021 0057   LEUKOCYTESUR SMALL (A) 07/30/2021 0057   Sepsis Labs: @LABRCNTIP (procalcitonin:4,lacticidven:4) ) Recent Results (from the past 240 hour(s))  Blood Culture (routine x 2)     Status: None (Preliminary result)   Collection Time: 07/30/21  5:51 AM   Specimen: BLOOD  Result Value Ref Range Status   Specimen Description BLOOD LEFT HAND  Final   Special Requests   Final    BOTTLES DRAWN AEROBIC AND ANAEROBIC Blood Culture adequate volume   Culture   Final    NO GROWTH 4 DAYS Performed at [facility], [address] [place], [place] [address]    Report Status PENDING  Incomplete  Blood Culture (routine x 2)     Status: None (Preliminary result)   Collection Time: 07/30/21  5:51 AM   Specimen: BLOOD  Result Value Ref Range Status   Specimen Description BLOOD RIGHT FOREARM  Final   Special Requests   Final    BOTTLES DRAWN AEROBIC AND ANAEROBIC Blood Culture adequate volume   Culture   Final    NO GROWTH 4 DAYS Performed at [facility], [address] [place], [place] [address]    Report Status PENDING  Incomplete  Urine Culture     Status: Abnormal   Collection Time: 07/30/21  5:51 AM   Specimen: Urine, Random  Result Value Ref Range Status   Specimen Description   Final    URINE, RANDOM Performed at [facility], [address] [place], [place] [address]    Special Requests   Final    NONE Performed at [facility], [address], [place], [place] [address]    Culture (A)  Final    <10,000 COLONIES/mL INSIGNIFICANT GROWTH Performed at [facility], [place] [address] [place], [place] [address]    Report Status 07/31/2021 FINAL  Final  Resp Panel by RT-PCR (Flu A&B, Covid) Nasopharyngeal Swab     Status:  None   Collection Time: 07/30/21  5:51 AM   Specimen: Nasopharyngeal Swab; Nasopharyngeal(NP) swabs in vial  transport medium  Result Value Ref Range Status   SARS Coronavirus 2 by RT PCR NEGATIVE NEGATIVE Final    Comment: (NOTE) SARS-CoV-2 target nucleic acids are NOT DETECTED.  The SARS-CoV-2 RNA is generally detectable in upper respiratory specimens during the acute phase of infection. The lowest concentration of SARS-CoV-2 viral copies this assay can detect is 138 copies/mL. A negative result does not preclude SARS-Cov-2 infection and should not be used as the sole basis for treatment or other patient management decisions. A negative result may occur with  improper specimen collection/handling, submission of specimen other than nasopharyngeal swab, presence of viral mutation(s) within the areas targeted by this assay, and inadequate number of viral copies(<138 copies/mL). A negative result must be combined with clinical observations, patient history, and epidemiological information. The expected result is Negative.  Fact Sheet for Patients:  EntrepreneurPulse.com.au  Fact Sheet for Healthcare Providers:  IncredibleEmployment.be  This test is no t yet approved or cleared by the [place] FDA and  has been authorized for detection and/or diagnosis of SARS-CoV-2 by FDA under an Emergency Use Authorization (EUA). This EUA will remain  in effect (meaning this test can be used) for the duration of the COVID-19 declaration under Section 564(b)(1) of the Act, 21 U.S.C.section 360bbb-3(b)(1), unless the authorization is terminated  or revoked sooner.       Influenza A by PCR NEGATIVE NEGATIVE Final   Influenza B by PCR NEGATIVE NEGATIVE Final    Comment: (NOTE) The Xpert Xpress SARS-CoV-2/FLU/RSV plus assay is intended as an aid in the diagnosis of influenza from Nasopharyngeal swab specimens and should not be used as a sole basis for  treatment. Nasal washings and aspirates are unacceptable for Xpert Xpress SARS-CoV-2/FLU/RSV testing.  Fact Sheet for Patients: EntrepreneurPulse.com.au  Fact Sheet for Healthcare Providers: IncredibleEmployment.be  This test is not yet approved or cleared by the [place] FDA and has been authorized for detection and/or diagnosis of SARS-CoV-2 by FDA under an Emergency Use Authorization (EUA). This EUA will remain in effect (meaning this test can be used) for the duration of the COVID-19 declaration under Section 564(b)(1) of the Act, 21 U.S.C. section 360bbb-3(b)(1), unless the authorization is terminated or revoked.  Performed at [facility], [address] [place], [place] [address]          Radiology Studies: [facility] Chest [place] 1 View  Result Date: 08/03/2021 CLINICAL DATA:  Pneumonitis.  History of cancer. EXAM: PORTABLE CHEST 1 VIEW COMPARISON:  July 29, 2021 FINDINGS: Diffuse bilateral patchy pulmonary infiltrates. Stable left Port-A-Cath. No pneumothorax. No change in the cardiomediastinal silhouette. IMPRESSION: New diffuse bilateral pulmonary patchy infiltrates. The findings are most consistent with multifocal pneumonia. Edema considered less likely. Electronically Signed   By: Dorise Bullion III M.D.   On: 08/03/2021 08:16      Scheduled Meds:  apixaban  5 mg Oral BID   Chlorhexidine Gluconate Cloth  6 each Topical Daily   docusate sodium  100 mg Oral Daily   fentaNYL  1 patch Transdermal Q72H   gabapentin  600 mg Oral TID   influenza vaccine adjuvanted  0.5 mL Intramuscular Tomorrow-1000   methylPREDNISolone (SOLU-MEDROL) injection  40 mg Intravenous BID   metoprolol tartrate  25 mg Oral BID   mometasone-formoterol  2 puff Inhalation BID   multivitamin with minerals  1 tablet Oral Daily   oxcarbazepine  600 mg Oral BID   sodium chloride flush  10-40 mL Intracatheter Q12H   Continuous Infusions:  cefTRIAXone (ROCEPHIN)  IV 2 g (08/02/21 1806)     LOS: 4 days      Debbe Odea, MD [facility] Pager: [email or web address] 08/03/2021, 1:23 PM

## 2021-08-03 NOTE — Progress Notes (Signed)
Pulmonary Medicine          Date: 08/03/2021,   MRN# [id number] Shannon Schroeder Mar 25, 1956     AdmissionWeight: 57.3 kg                 CurrentWeight: 57.3 kg   Referring physician: Dr Raul Del    CHIEF COMPLAINT:   Multifocal ground glass infiltrates of bilateral lungs   HISTORY OF PRESENT ILLNESS    This is a 65 y.o. female with medical history significant for AF on chronic anticoagulation therapy, history of head and neck cancer on chemotherapy and immunotherapy, history of hypertension who presents to the emergency room for evaluation of worsening malaise. Currently on chemo per med-onc Dr Tasia Catchings.  Reports NV symptoms post chemo.  She has chronic dysphagia.  Reports over last week worsening dyspnea on exertion . Initial labs were unremarkable except for chronic anemia.  RVP was negative.  CT chest done showing multifocal GGO centrally predominant with generous PA diameter. PCCM consult for further evaluation and treatment.   08/02/21- patient improved clinically.  She reports less dyspnea. She is developing crackles at bases bilaterally, will dc IVF NS 166ml/h patient is euvolemic.  Reducing steroids to 40 bid solumedrol IV.  PT/OT as ordered. CBC w diff repeat today due to hemoptysis with reduced h/h.    08/03/21-  patient is improved, she is participating with PT/OT now doing well with slight and transient desaturation and dyspnea to [facility] level. She had CXR with persistent airspace opacification bilaterally.  Radiography generally lags behind clinical improvement and I suspect this will get better over next 8 wks. Will test for other less likely possibilites via serology today including fungal/PCP.  CBC with diff consistent with anemia acutely with reactive thrombocytosis.  Appreciate collaboration with everyone involved.      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    Anxiety    Arthritis    Bipolar disorder (HCC)    COPD (chronic obstructive pulmonary disease) (HCC)    NO INHALERS   Depression    Dysrhythmia    H/O V TACH   Head and neck cancer ([place]) 07/29/2020   Headache    H/O MIGRAINES   Hypertension    Seizures ([place])    X1 AFTER FALL     SURGICAL HISTORY   Past Surgical History:  Procedure Laterality Date   ABDOMINAL HYSTERECTOMY     partial   BREAST SURGERY     biopsy   CARDIAC CATHETERIZATION     X 2   CARPAL TUNNEL RELEASE Right    CARPECTOMY HAND Right    CATARACT EXTRACTION W/PHACO Left 03/08/2018   Procedure: CATARACT EXTRACTION PHACO AND INTRAOCULAR LENS PLACEMENT ([place]);  Surgeon: Birder Robson, MD;  Location: [facility] ORS;  Service: Ophthalmology;  Laterality: Left;  [place] 00:55 AP% 13.5 CDE 7.52 Fluid pack lot # [id number]   CATARACT EXTRACTION W/PHACO Right 04/05/2018   Procedure: CATARACT EXTRACTION PHACO AND INTRAOCULAR LENS PLACEMENT (I[facility]);  Surgeon: Birder Robson, MD;  Location: [facility] ORS;  Service: Ophthalmology;  Laterality: Right;  [place] 00:36 AP% 15.9 CDE 5.72 Fluid pack lot # [id number]   CEREBRAL ANEURYSM REPAIR     CHOLECYSTECTOMY     COLONOSCOPY WITH PROPOFOL N/A 04/26/2019   Procedure: COLONOSCOPY WITH PROPOFOL;  Surgeon: Jonathon Bellows, MD;  Location: [facility] ENDOSCOPY;  Service: Gastroenterology;  Laterality: N/A;  PORTACATH PLACEMENT Left 08/05/2020   Procedure: INSERTION PORT-A-CATH;  Surgeon: Herbert Pun, MD;  Location: [facility] ORS;  Service: General;  Laterality: Left;   TUBAL LIGATION       FAMILY HISTORY   Family History  Problem Relation Age of Onset   Hypertension Mother    Heart failure Mother    Hypertension Brother    Stroke Brother    Hypertension Son    Emphysema Maternal Aunt    Hypertension Paternal Aunt    Hypertension Paternal Uncle    Hypertension Maternal Grandmother    Hypertension Paternal Grandmother      SOCIAL HISTORY   Social History   Tobacco  Use   Smoking status: Every Day    Packs/day: 0.25    Years: 50.00    Pack years: 12.50    Types: Cigarettes   Smokeless tobacco: Never  Vaping Use   Vaping Use: Never used  Substance Use Topics   Alcohol use: No   Drug use: No     MEDICATIONS    Home Medication:    Current Medication:  Current Facility-Administered Medications:    acetaminophen (TYLENOL) suppository 650 mg, 650 mg, Rectal, Q4H PRN, Sharion Settler, NP, 650 mg at 07/31/21 2055   apixaban (ELIQUIS) tablet 5 mg, 5 mg, Oral, BID, Agbata, Tochukwu, MD, 5 mg at 08/03/21 1055   azithromycin (ZITHROMAX) 500 mg in sodium chloride 0.9 % 250 mL IVPB, 500 mg, Intravenous, Daily, Agbata, Tochukwu, MD, Last Rate: 250 mL/hr at 08/03/21 1105, 500 mg at 08/03/21 1105   cefTRIAXone (ROCEPHIN) 2 g in sodium chloride 0.9 % 100 mL IVPB, 2 g, Intravenous, q1800, Agbata, Tochukwu, MD, Last Rate: 200 mL/hr at 08/02/21 1806, 2 g at 08/02/21 1806   Chlorhexidine Gluconate Cloth 2 % PADS 6 each, 6 each, Topical, Daily, Rizwan, Eunice Blase, MD, 6 each at 08/03/21 1105   docusate sodium (COLACE) capsule 100 mg, 100 mg, Oral, Daily, Agbata, Tochukwu, MD, 100 mg at 08/03/21 1054   fentaNYL (DURAGESIC) 75 MCG/HR 1 patch, 1 patch, Transdermal, Q72H, Agbata, Tochukwu, MD   gabapentin (NEURONTIN) capsule 600 mg, 600 mg, Oral, TID, Agbata, Tochukwu, MD, 600 mg at 08/03/21 1054   influenza vaccine adjuvanted (FLUAD) injection 0.5 mL, 0.5 mL, Intramuscular, Tomorrow-1000, Agbata, Tochukwu, MD   ipratropium-albuterol (DUONEB) 0.5-2.5 (3) MG/3ML nebulizer solution 3 mL, 3 mL, Nebulization, Q6H PRN, Agbata, Tochukwu, MD   melatonin tablet 10 mg, 10 mg, Oral, QHS PRN, Agbata, Tochukwu, MD   methylPREDNISolone sodium succinate (SOLU-MEDROL) 40 mg/mL injection 40 mg, 40 mg, Intravenous, BID, Lanney Gins, Koralee Wedeking, MD, 40 mg at 08/03/21 1055   metoprolol tartrate (LOPRESSOR) tablet 25 mg, 25 mg, Oral, BID, Rizwan, Saima, MD, 25 mg at 08/03/21 1054    mometasone-formoterol (DULERA) 100-5 MCG/ACT inhaler 2 puff, 2 puff, Inhalation, BID, Agbata, Tochukwu, MD, 2 puff at 08/03/21 1054   multivitamin with minerals tablet 1 tablet, 1 tablet, Oral, Daily, Agbata, Tochukwu, MD, 1 tablet at 07/30/21 1216   Oxcarbazepine (TRILEPTAL) tablet 600 mg, 600 mg, Oral, BID, Agbata, Tochukwu, MD, 600 mg at 08/03/21 1054   oxyCODONE (Oxy IR/ROXICODONE) immediate release tablet 10-20 mg, 10-20 mg, Oral, Q4H PRN, Brantley Stage, Walid A, RPH, 20 mg at 08/02/21 1212   polyethylene glycol (MIRALAX / GLYCOLAX) packet 17 g, 17 g, Oral, Daily PRN, Agbata, Tochukwu, MD   sodium chloride flush (NS) 0.9 % injection 10-40 mL, 10-40 mL, Intracatheter, Q12H, Rizwan, Saima, MD, 10 mL at 08/03/21 1106   sodium chloride flush (NS) 0.9 % injection  10-40 mL, 10-40 mL, Intracatheter, PRN, Debbe Odea, MD   traZODone (DESYREL) tablet 50 mg, 50 mg, Oral, QHS PRN, Rito Ehrlich A, RPH, 50 mg at 08/02/21 2141  Facility-Administered Medications Ordered in Other Encounters:    heparin lock flush 100 UNIT/ML injection, , , ,    heparin lock flush 100 UNIT/ML injection, , , ,    heparin lock flush 100 unit/mL, 500 Units, Intravenous, Once, Earlie Server, MD   heparin lock flush 100 unit/mL, 500 Units, Intracatheter, Once PRN, Earlie Server, MD   sodium chloride flush (NS) 0.9 % injection 10 mL, 10 mL, Intravenous, PRN, Earlie Server, MD, 10 mL at 10/10/20 1040   sodium chloride flush (NS) 0.9 % injection 10 mL, 10 mL, Intravenous, PRN, Earlie Server, MD, 10 mL at 11/13/20 0836   sodium chloride flush (NS) 0.9 % injection 10 mL, 10 mL, Intravenous, PRN, Earlie Server, MD, 10 mL at 12/30/20 7824   sodium chloride flush (NS) 0.9 % injection 10 mL, 10 mL, Intravenous, PRN, Earlie Server, MD, 10 mL at 01/14/21 2353   sodium chloride flush (NS) 0.9 % injection 10 mL, 10 mL, Intravenous, PRN, Earlie Server, MD, 10 mL at 06/11/21 0815   sodium chloride flush (NS) 0.9 % injection 10 mL, 10 mL, Intravenous, PRN, Earlie Server, MD, 10 mL at  07/09/21 6144    ALLERGIES   Naproxen, Aspirin, Belladonna alkaloids, Fluoxetine, Fluoxetine hcl, Paroxetine hcl, Phenobarbital, and Prozac [fluoxetine hcl]     REVIEW OF SYSTEMS    Review of Systems:  Gen:  Denies  fever, sweats, chills weigh loss  HEENT: Denies blurred vision, double vision, ear pain, eye pain, hearing loss, nose bleeds, sore throat Cardiac:  No dizziness, chest pain or heaviness, chest tightness,edema Resp:   Denies cough or sputum porduction, shortness of breath,wheezing, hemoptysis,  Gi: Denies swallowing difficulty, stomach pain, nausea or vomiting, diarrhea, constipation, bowel incontinence Gu:  Denies bladder incontinence, burning urine Ext:   Denies Joint pain, stiffness or swelling Skin: Denies  skin rash, easy bruising or bleeding or hives Endoc:  Denies polyuria, polydipsia , polyphagia or weight change Psych:   Denies depression, insomnia or hallucinations   Other:  All other systems negative   VS: BP 125/82 (BP Location: Left Arm)   Pulse 69   Temp 98 F (36.7 C)   Resp 18   Ht 5\' 6"  (1.676 m)   Wt 57.3 kg   LMP 11/19/1988 (Exact Date)   SpO2 97%   BMI 20.39 kg/m      PHYSICAL EXAM    GENERAL:NAD, no fevers, chills, no weakness no fatigue HEAD: Normocephalic, atraumatic.  EYES: Pupils equal, round, reactive to light. Extraocular muscles intact. No scleral icterus.  MOUTH: Moist mucosal membrane. Dentition intact. No abscess noted.  EAR, NOSE, THROAT: Clear without exudates. No external lesions.  NECK: Supple. No thyromegaly. No nodules. No JVD.  PULMONARY: bilateral mild rhonchi with decreased air entry.  CARDIOVASCULAR: S1 and S2. Regular rate and rhythm. No murmurs, rubs, or gallops. No edema. Pedal pulses 2+ bilaterally.  GASTROINTESTINAL: Soft, nontender, nondistended. No masses. Positive bowel sounds. No hepatosplenomegaly.  MUSCULOSKELETAL: No swelling, clubbing, or edema. Range of motion full in all extremities.   NEUROLOGIC: Cranial nerves II through XII are intact. No gross focal neurological deficits. Sensation intact. Reflexes intact.  SKIN: No ulceration, lesions, rashes, or cyanosis. Skin warm and dry. Turgor intact.  PSYCHIATRIC: Mood, affect within normal limits. The patient is awake, alert and oriented x 3. Insight, judgment  intact.       IMAGING    CT CHEST ABDOMEN PELVIS W CONTRAST  Result Date: 07/30/2021 CLINICAL DATA:  65 year old female with history of multilobar pneumonia on chest x-ray and abdominal pain. 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 Possible sepsis. EXAM: CT CHEST, ABDOMEN, AND PELVIS WITH CONTRAST TECHNIQUE: Multidetector CT imaging of the chest, abdomen and pelvis was performed following the standard protocol during bolus administration of intravenous contrast. CONTRAST:  56mL OMNIPAQUE IOHEXOL 350 MG/ML SOLN COMPARISON:  PET-CT 07/08/2021. FINDINGS: CT CHEST FINDINGS Cardiovascular: Heart size is normal. There is no significant pericardial fluid, thickening or pericardial calcification. Aortic atherosclerosis. No definite coronary artery calcifications. Left internal jugular single-lumen porta cath with tip terminating in the distal superior vena cava. Mediastinum/Nodes: Multiple prominent but nonenlarged mediastinal lymph nodes are nonspecific. No definite pathologic lymphadenopathy identified in mediastinal or hilar nodal stations. Esophagus is unremarkable in appearance. No axillary lymphadenopathy. Lungs/Pleura: Worsening patchy multifocal areas of ground-glass attenuation, septal thickening and thickening of the peribronchovascular interstitium is seen throughout the lungs bilaterally with no discernible craniocaudal gradient. No confluent consolidative airspace disease. No pleural effusions. No definite suspicious appearing pulmonary nodules or masses are noted. Musculoskeletal: There are no aggressive appearing lytic or blastic lesions noted in the visualized portions  of the skeleton. CT ABDOMEN PELVIS FINDINGS Hepatobiliary: No suspicious cystic or solid hepatic lesions. No intra or extrahepatic biliary ductal dilatation. Status post cholecystectomy. Pancreas: No pancreatic mass. No pancreatic ductal dilatation. No pancreatic or peripancreatic fluid collections or inflammatory changes. Spleen: Unremarkable. Adrenals/Urinary Tract: Bilateral kidneys and adrenal glands are normal in appearance. No hydroureteronephrosis. Urinary bladder is normal in appearance. Stomach/Bowel: The appearance of the stomach is normal. There is no pathologic dilatation of small bowel or colon. Normal appendix. Vascular/Lymphatic: Aortic atherosclerosis, without evidence of aneurysm or dissection in the abdominal or pelvic vasculature. No lymphadenopathy noted in the abdomen or pelvis. Reproductive: Status post hysterectomy. Ovaries are not confidently identified may be surgically absent or atrophic. Other: No significant volume of ascites.  No pneumoperitoneum. Musculoskeletal: Chronic compression fracture of anterior aspect of the superior endplate of L1, similar to the prior study from 05/01/2021 with minimal loss of anterior vertebral body height. There are no aggressive appearing lytic or blastic lesions noted in the visualized portions of the skeleton. IMPRESSION: 1. Marked worsening multifocal ground-glass attenuation and septal thickening in the lungs bilaterally when compared to prior PET-CT 07/08/2021. Findings are favored to reflect a drug-induced pneumonitis, but could alternatively reflect multilobar bilateral pneumonia from atypical infection. Further clinical evaluation is recommended. 2. No acute findings are noted in the abdomen or pelvis. 3. No definite signs of metastatic disease in the chest, abdomen or pelvis. 4. Aortic atherosclerosis. 5. Additional incidental findings, as above. Electronically Signed   By: Vinnie Langton M.D.   On: 07/30/2021 06:53   NM PET Image Restag (PS)  Skull Base To Thigh  Result Date: 07/09/2021 CLINICAL DATA:  Subsequent treatment strategy for head/neck cancer. EXAM: NUCLEAR MEDICINE PET SKULL BASE TO THIGH TECHNIQUE: 6.1 mCi F-18 FDG was injected intravenously. Full-ring PET imaging was performed from the skull base to thigh after the radiotracer. CT data was obtained and used for attenuation correction and anatomic localization. Fasting blood glucose: 91 mg/dl COMPARISON:  CT abdomen 05/01/2021 and PET 01/09/2021. CT chest 09/26/2020. FINDINGS: Mediastinal blood pool activity: SUV max 1.7 Liver activity: SUV max NA NECK: Cavitary mass in the right maxillary sinus extends to the right nasopharynx, appears less thick walled than on 01/09/2021  and has minimal residual peripheral hypermetabolism, SUV max 4.7 laterally. No hypermetabolic lymph nodes. Incidental CT findings: Associated osseous destruction surrounding the right maxillary sinus. CHEST: No hypermetabolic mediastinal, hilar or axillary lymph nodes. No hypermetabolic pulmonary nodules. Incidental CT findings: Left IJ Port-A-Cath terminates in the SVC. Heart is at the upper limits of normal in size. No pericardial or pleural effusion. Basilar predominant peribronchovascular ground-glass and mild septal thickening. ABDOMEN/PELVIS: No abnormal hypermetabolism in the liver, adrenal glands, spleen or pancreas. No hypermetabolic lymph nodes. Incidental CT findings: Liver is unremarkable. Cholecystectomy. Adrenal glands, kidneys, spleen, pancreas, stomach and bowel are grossly unremarkable. Atherosclerotic calcification of the aorta. SKELETON: No abnormal osseous hypermetabolism. Very mild hypermetabolism associated with a fracture of the superior endplate of L1 is new from 01/09/2021. Incidental CT findings: Degenerative changes in the spine. IMPRESSION: 1. Continued response to therapy as evidenced by decreased wall thickening and hypermetabolism associated with a cavitary right maxillary/nasopharyngeal  mass. No evidence of distant metastatic disease. 2. Subacute compression of the L1 superior endplate, new from 01/09/2021. 3.  Aortic atherosclerosis (ICD10-I70.0). Electronically Signed   By: Lorin Picket M.D.   On: 07/09/2021 13:28   DG Chest Port 1 View  Result Date: 08/03/2021 CLINICAL DATA:  Pneumonitis.  History of cancer. EXAM: PORTABLE CHEST 1 VIEW COMPARISON:  July 29, 2021 FINDINGS: Diffuse bilateral patchy pulmonary infiltrates. Stable left Port-A-Cath. No pneumothorax. No change in the cardiomediastinal silhouette. IMPRESSION: New diffuse bilateral pulmonary patchy infiltrates. The findings are most consistent with multifocal pneumonia. Edema considered less likely. Electronically Signed   By: Dorise Bullion III M.D.   On: 08/03/2021 08:16   DG Chest Port 1 View  Result Date: 07/29/2021 CLINICAL DATA:  65 year old female with history of cough and weakness for the past several days. EXAM: PORTABLE CHEST 1 VIEW COMPARISON:  Chest x-ray 12/30/2020. FINDINGS: Left-sided internal jugular single-lumen porta cath with tip terminating in the distal superior vena cava. Lung volumes are normal. Diffuse interstitial prominence and peribronchial cuffing. No confluent consolidative airspace disease. No pleural effusions. No pneumothorax. No pulmonary nodule or mass noted. Pulmonary vasculature and the cardiomediastinal silhouette are within normal limits. Atherosclerotic calcifications in the thoracic aorta. IMPRESSION: 1. The appearance the chest suggests severe bronchitis, potentially with developing multilobar bronchopneumonia. 2. Aortic atherosclerosis. Electronically Signed   By: Vinnie Langton M.D.   On: 07/29/2021 19:39      ASSESSMENT/PLAN   Bilateral multifocal pneumonia -suspect this is due to [place] however patient reports hemoptysis bright red blood and has significant decrement in hemoglobin.  Possible Alevolar hemorrhage in context of pnemonitis with concomitant  anticoagulation.  Although she is on IV fluids remainder of cell lines are not proportionally reduced which suggests blood loss.  I agree with steroids.  She is improved already and should be better in 48 hours with steroids.  Agree with empiric abx.  She has received only 2 doses of Keytruda and I would suggest trying to give this again next month after improvement. She has dysphagia and higher risk for aspiration. Noted s/p SLP. Will reduce solumedrol to 60 bid today.   -continue solumedrol bid 40 mg- CXR interval worse, DCD IV fluids -finish empiric CAP regimen -Continue PT/OT   Per primary team AF-metoprolol COPD-dulera, she uses advair HFA and Ventolin at home Head/Neck CA- per med onc Dr Tasia Catchings   Thank you for allowing me to participate in the care of this patient.   Patient/Family are satisfied with care plan and all questions have been answered.  This document was prepared using Dragon voice recognition software and may include unintentional dictation errors.     Ottie Glazier, M.D.  Division of [employer]

## 2021-08-03 NOTE — TOC CM/SW Note (Addendum)
Patient is refusing SNF, HH, and DME.  Unsafe to DC without this support.  MD to have patient's Oncologist talk to her tomorrow to see if she will agree to support at home.  TOC will follow up after this discussion. Left handoff for weekday [facility] team.  Oleh Genin, [place]

## 2021-08-03 NOTE — Progress Notes (Signed)
Physical Therapy Treatment Patient Details Name: Shannon Schroeder MRN: [id number] DOB: 12/18/1955 Today's Date: 08/03/2021   History of Present Illness Pt is a 65 y/o F admitted on 07/30/21 after presenting with c/o worsening malaise. CT chest done showing multifocal GGO centrally predominant with generous PA diameter. Pt is being treated for Bilateral multifocal pneumonia. 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 Pt required mod I for bed mobility and supervision for transfers and limited gait. Initially required 1L for 1st bout of gait (7ft), demonstrating delayed desaturation to 82%. Required 6L and a few minutes with pursed lip breathing for recovery >90%. Required 6L to ambulate back to recliner (46ft), desaturating to 88%. Able to wean to 1L post session with SpO2 at 95%. Pt will continue to benefit from skilled acute PT services to address deficits for return to baseline function. Due to decreased safety awareness, decreased balance, decreased activity tolerance, and increased O2 dependence from baseline, will update recommendation to SNF at DC.  Pt currently declining any sort of post acute rehab services (HHPT and cardiac rehab). Pt stating that she does not want anyone in her home as she has 2 dogs and is worried that they will get out. Pt states that her roommate has a family member who is a Horticulturist, commercial" and can assist with therapy. PT explained reason and benefit of working with HHPT for return to baseline function and improved safety with mobility. Pt continues to decline. PT explained benefits of working with cardiac rehab outside the home, but pt stating that she does not want to be a burden to her roommate (for transportation). Pt stating she currently  has a manual WC, shower chair, and RW at home. Discussed benefits of using 3in1 at home, but pt declining as she states she does not have room in her home for 3in1. RN and MD notified of session and pt concerns. At this time, PT does not feel that pt is safe to DC due to increased O2 needs with limited functional mobility.     Recommendations for follow up therapy are one component of a multi-disciplinary discharge planning process, led by the attending physician.  Recommendations may be updated based on patient status, additional functional criteria and insurance authorization.  Follow Up Recommendations  Supervision for mobility/OOB;SNF     Equipment Recommendations  3in1 (PT)       Precautions / Restrictions Precautions Precautions: Fall Restrictions Weight Bearing Restrictions: No     Mobility  Bed Mobility Overal bed mobility: Needs Assistance Bed Mobility: Supine to Sit     Supine to sit: HOB elevated;Modified independent (Device/Increase time)     General bed mobility comments: increased time/effort    Transfers Overall transfer level: Needs assistance Equipment used: Rolling walker (2 wheeled) Transfers: Sit to/from Stand Sit to Stand: Supervision         General transfer comment: Cues for hand placement on RW, increased time/effort  Ambulation/Gait Ambulation/Gait assistance: Supervision Gait Distance (Feet): 40 Feet (77ft x2) Assistive device: Rolling walker (2 wheeled)       General Gait Details: demonstrates slowed cadence, downward gaze, decreased step length/foot clearance bil, and narrow BOS. Ambulated to bathroom (49ft) on 1L maintaining >90% with delayed desaturation to 82%. Required 6L and a few minutes of pursed  lip breathing for recovery >90% while seated. Desat to 88% walking back to recliner (45ft) on 6L. C/o DOE and mild dizziness during mobility.        Balance Overall balance assessment: Needs assistance Sitting-balance support: Feet  supported;Bilateral upper extremity supported Sitting balance-Leahy Scale: Good     Standing balance support: During functional activity;No upper extremity supported Standing balance-Leahy Scale: Fair Standing balance comment: B UE support in RW        Cognition Arousal/Alertness: Awake/alert Behavior During Therapy: WFL for tasks assessed/performed Overall Cognitive Status: Within Functional Limits for tasks assessed             Exercises Other Exercises Other Exercises: bed mobility, transfers, and limited gait. Grossly supervision for safety and monitoring of vitals. Desaturation during mobility requiring increased O2 needs. Increased time/effort during session for education and toileting. Other Exercises: Pt educated regarding: PT role/POC, SpO2 reading, pursed lip breathing, benefits of DME for energy conservation, benefits of [place] services and cardiac rehab, difference between [facility] tech and actual PT to assist with rehab, use of WC for community ambulation. Pt currently declining any post acute PT services and DME. RN/MD notified.    General Comments General comments (skin integrity, edema, etc.): On RA upon entry with SpO2 >90%. Required 1L for first bout of gait with desat to 82%, requiring 6L for recovery. Desat to 88% on 6L walking back to recliner. Able to wean to 1L post session with SpO2 at 95%.      Pertinent Vitals/Pain Pain Assessment: No/denies pain           PT Goals (current goals can now be found in the care plan section) Acute Rehab PT Goals Patient Stated Goal: go home PT Goal Formulation: With patient Time For Goal Achievement: 08/16/21 Potential to Achieve Goals: Fair Progress towards PT goals: Progressing toward goals    Frequency    Min 2X/week      PT Plan Discharge plan needs to be updated       AM-PAC PT "6 Clicks" Mobility   Outcome Measure  Help needed turning from your back to your side while in a flat bed without using bedrails?:  None Help needed moving from lying on your back to sitting on the side of a flat bed without using bedrails?: None Help needed moving to and from a bed to a chair (including a wheelchair)?: A Little Help needed standing up from a chair using your arms (e.g., wheelchair or bedside chair)?: A Little Help needed to walk in hospital room?: A Little Help needed climbing 3-5 steps with a railing? : A Lot 6 Click Score: 19    End of Session Equipment Utilized During Treatment: Oxygen;Gait belt Activity Tolerance: Patient limited by fatigue;Treatment limited secondary to medical complications (Comment) (pt self limiting) Patient left: with chair alarm set;in chair;with call bell/phone within reach Nurse Communication: Mobility status (O2) PT Visit Diagnosis: Muscle weakness (generalized) (M62.81);Difficulty in walking, not elsewhere classified (R26.2);Unsteadiness on feet (R26.81)     Time: 0071-2197 PT Time Calculation (min) (ACUTE ONLY): 31 min  Charges:  $Therapeutic Exercise: 23-37 mins                     Herminio Commons, PT, DPT 12:57 PM,08/03/21

## 2021-08-03 NOTE — Progress Notes (Addendum)
Ok to [employer] tele monitoring Per Dr Wynelle Cleveland

## 2021-08-04 DIAGNOSIS — J9601 Acute respiratory failure with hypoxia: Secondary | ICD-10-CM | POA: Diagnosis not present

## 2021-08-04 DIAGNOSIS — Z515 Encounter for palliative care: Secondary | ICD-10-CM | POA: Diagnosis not present

## 2021-08-04 DIAGNOSIS — G893 Neoplasm related pain (acute) (chronic): Secondary | ICD-10-CM | POA: Diagnosis not present

## 2021-08-04 DIAGNOSIS — C760 Malignant neoplasm of head, face and neck: Secondary | ICD-10-CM | POA: Diagnosis not present

## 2021-08-04 DIAGNOSIS — J189 Pneumonia, unspecified organism: Secondary | ICD-10-CM | POA: Diagnosis not present

## 2021-08-04 LAB — CULTURE, BLOOD (ROUTINE X 2)
Culture: NO GROWTH
Culture: NO GROWTH
Special Requests: ADEQUATE
Special Requests: ADEQUATE

## 2021-08-04 MED ORDER — FUROSEMIDE 10 MG/ML IJ SOLN
20.0000 mg | Freq: Once | INTRAMUSCULAR | Status: AC
  Administered 2021-08-04: 12:00:00 20 mg via INTRAVENOUS
  Filled 2021-08-04: qty 2

## 2021-08-04 NOTE — TOC Progression Note (Signed)
Transition of Care [facility]) - Progression Note    Patient Details  Name: Shannon Schroeder MRN: [id number] Date of Birth: 1956/07/20  Transition of Care [facility]) CM/SW Contact  Shelbie Hutching, RN Phone Number: 08/04/2021, 2:18 PM  Clinical Narrative:    RNCM met with patient at the bedside this morning to discuss discharge planning.  Patient adamantly refuses home health, she will not even consider it.  Patient does report having a walker and wheelchair.  She says her friend will take care of her and pick her up at discharge.  Patient will need oxygen at discharge but MD would like to see her require less oxygen with ambulation, currently requiring 6L for ambulation to maintain sats.     Expected Discharge Plan: Home/Self Care Barriers to Discharge: Continued Medical Work up  Expected Discharge Plan and Services Expected Discharge Plan: Home/Self Care   Discharge Planning Services: CM Consult   Living arrangements for the past 2 months: Single Family Home                 DME Arranged: Oxygen DME Agency: [employer]       HH Arranged: Refused SNF, Refused HH           Social Determinants of Health (SDOH) Interventions    Readmission Risk Interventions Readmission Risk Prevention Plan 08/01/2021 09/08/2020  Transportation Screening Complete Complete  PCP or Specialist Appt within 3-5 Days Complete Complete  HRI or [place] Complete Complete  Social Work Consult for [place] Planning/Counseling Complete Complete  Palliative Care Screening Complete Not Applicable  Medication Review Press photographer) Complete Complete  Some recent data might be hidden

## 2021-08-04 NOTE — Progress Notes (Addendum)
PROGRESS NOTE    Shannon Schroeder   OMV:[id number]  DOB: [birth year]  DOA: 07/30/2021 PCP: Merryl Hacker, No   Brief Narrative:  Shannon Britain Ainsworthis a 65 y.o. 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  She was diagnosed with head and neck cancer in 2021 and is currently receiving Keytruda and Cetuximab.   She presents to the hospital for complaints of nausea, vomiting and dry heaves which started after her last dose of cancer treatment on 07/23/2021.  She admits to shortness of breath with exertion. Chest x-ray reveals diffuse interstitial prominence and peribronchial cuffing. CT chest abdomen pelvis with contrast reveals: Multifocal groundglass attenuation and septal thickening in the lungs bilaterally which are worse when compared to PET/CT on 07/08/2021 - No definite signs of metastatic disease in the chest abdomen pelvis  In the ED, she was started on antibiotics: Vancomycin, azithromycin and cefepime   Subjective:  When she got out of bed today, she was very weak and short of breath. No other complaints.   Assessment & Plan:   Principal Problem:   Acute respiratory failure with hypoxemia (HCC) and hemoptysis  -Currently receiving ceftriaxone and azithromycin -May be related to aspiration however she is on 3 medications that can cause pneumonitis including Keytruda, Cetuximab and Amiodarone -It is to be noted that the patient has not had any fevers, sweats or chills and does not have any leukocytosis - plan to continue Ceftriaxone and Azithromycin x 5 days- today is the last day - IV steroids started to treat for drug induced pneumonitis  - s/p MBS, she has been started on liquid diet (she takes pureed and pudding thick liquids at baseline) - Interveral  worsening of infiltrated on CXR   - she required 4 L O2 when getting out of bed today- being given 1 time dose of 20 mg of Furosemide today- follow pulse ox    Problems: Anemia, normocytic - acute drop in Hgb noted from 10.6 to 8.7 on 9/29- Hgb has been stable since - patient had hemoptysis and was receiving IVF- partly dilutional and partly due to hemoptysis  Thrombocytosis, chronic - cont to follow as outpt  COPD / nicotine abuse - Continue nebulizer treatments  Nausea and vomiting - Status post treatment for cancer - has resolved  Hypokalemia - has been replaced  Right sided Maxillary cancer, squamous cell invasive cancer Generalized weakness with weight loss -She initially presented with a large right facial mass eroding into the maxillary area and multiple branches of the maxillary division of the right trigeminal nerve - Status post multiple cycles of chemotherapy-developed hypersensitivity to carboplatin in 5/22 -Receiving treatment by Dr. 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   Difficult to place patient No      Consultants:   Oncology Palliative care Procedures:  none Antimicrobials:  Anti-infectives (From admission, onward)    Start     Dose/Rate Route Frequency Ordered Stop   07/30/21 1800  cefTRIAXone (ROCEPHIN) 2 g in sodium chloride 0.9 % 100 mL IVPB        2 g 200 mL/hr over 30 Minutes Intravenous Daily-1800 07/30/21 1126 08/03/21 1839   07/30/21 1145  azithromycin (ZITHROMAX) 500 mg in sodium chloride 0.9 % 250 mL IVPB        500 mg 250 mL/hr over 60 Minutes Intravenous Daily 07/30/21 1126 08/03/21 1205   07/30/21 0600  vancomycin (VANCOREADY) IVPB 1250 mg/250 mL        1,250 mg 166.7 mL/hr over 90 Minutes Intravenous  Once 07/30/21 0557 07/30/21 1019   07/30/21 0545  ceFEPIme (MAXIPIME) 1 g in sodium chloride 0.9 % 100 mL IVPB        1 g 200 mL/hr over 30 Minutes Intravenous  Once 07/30/21 0540 07/30/21 0753        Objective: Vitals:   08/04/21 0745 08/04/21 0747 08/04/21 0749 08/04/21 1118  BP:    122/72  Pulse:    76  Resp:    16  Temp:    98.1 F (36.7 C)  TempSrc:      SpO2: (!) 82% 92% 93% 99%  Weight:      Height:        Intake/Output Summary (Last 24 hours) at 08/04/2021 1433 Last data filed at 08/03/2021 1644 Gross per 24 hour  Intake --  Output 150 ml  Net -150 ml    Filed Weights   07/31/21 0313  Weight: 57.3 kg    Examination: General exam: Appears comfortable  HEENT: PERRLA, oral mucosa moist, no sclera icterus or thrush Respiratory system: crackles at bases.  Respiratory effort normal. Cardiovascular system: S1 & S2 heard, regular rate and rhythm Gastrointestinal system: Abdomen soft, non-tender, nondistended. Normal bowel sounds   Central nervous system: Alert and oriented. No focal neurological deficits. Extremities: No cyanosis, clubbing or edema Skin: No rashes or ulcers Psychiatry:  Mood & affect appropriate.      Data Reviewed: I have personally reviewed following labs and imaging studies  CBC: Recent Labs  Lab 07/29/21 2030 07/31/21 0600  08/02/21 1141  WBC 10.2 7.9 11.2*  NEUTROABS  --   --  9.8*  HGB 10.6* 8.7* 8.6*  HCT 32.0* 27.5* 26.6*  MCV 91.7 92.3 93.7  PLT 512* 494* 610*    Basic Metabolic Panel: Recent Labs  Lab 07/29/21 2030 07/30/21 0551 07/31/21 0600 08/01/21 0749  NA 134*  --  137 141  K 3.2*  --  3.0* 3.9  CL 93*  --  99 105  CO2 27  --  26 21*  GLUCOSE 103*  --  77 95  BUN 11  --  11 18  CREATININE 0.46  --  0.40* 0.33*  CALCIUM 8.5*  --  8.0* 7.9*  MG  --  1.8  --   --     GFR: Estimated Creatinine Clearance: 62.6 mL/min (A) (by C-G formula based on SCr of 0.33  mg/dL (L)). Liver Function Tests: Recent Labs  Lab 07/29/21 2030  AST 21  ALT 21  ALKPHOS 216*  BILITOT 0.5  PROT 7.4  ALBUMIN 2.6*    No results for input(s): LIPASE, AMYLASE in the last 168 hours. No results for input(s): AMMONIA in the last 168 hours. Coagulation Profile: No results for input(s): INR, PROTIME in the last 168 hours. Cardiac Enzymes: No results for input(s): CKTOTAL, CKMB, CKMBINDEX, TROPONINI in the last 168 hours. BNP (last 3 results) No results for input(s): PROBNP in the last 8760 hours. HbA1C: No results for input(s): HGBA1C in the last 72 hours. CBG: No results for input(s): GLUCAP in the last 168 hours. Lipid Profile: No results for input(s): CHOL, HDL, LDLCALC, TRIG, CHOLHDL, LDLDIRECT in the last 72 hours. Thyroid Function Tests: No results for input(s): TSH, T4TOTAL, FREET4, T3FREE, THYROIDAB in the last 72 hours. Anemia Panel: Recent Labs    08/03/21 1308  FERRITIN 72   Urine analysis:    Component Value Date/Time   COLORURINE AMBER (A) 07/30/2021 0057   APPEARANCEUR CLOUDY (A) 07/30/2021 0057   APPEARANCEUR Clear 12/21/2018 1041   LABSPEC 1.032 (H) 07/30/2021 0057   LABSPEC 1.017 04/23/2015 0000   PHURINE 6.0 07/30/2021 0057   GLUCOSEU NEGATIVE 07/30/2021 0057   HGBUR NEGATIVE 07/30/2021 0057   BILIRUBINUR SMALL (A) 07/30/2021 0057   BILIRUBINUR Negative 12/21/2018 1041    KETONESUR 20 (A) 07/30/2021 0057   PROTEINUR 100 (A) 07/30/2021 0057   NITRITE NEGATIVE 07/30/2021 0057   LEUKOCYTESUR SMALL (A) 07/30/2021 0057   Sepsis Labs: @LABRCNTIP (procalcitonin:4,lacticidven:4) ) Recent Results (from the past 240 hour(s))  Blood Culture (routine x 2)     Status: None   Collection Time: 07/30/21  5:51 AM   Specimen: BLOOD  Result Value Ref Range Status   Specimen Description BLOOD LEFT HAND  Final   Special Requests   Final    BOTTLES DRAWN AEROBIC AND ANAEROBIC Blood Culture adequate volume   Culture   Final    NO GROWTH 5 DAYS Performed at [facility], [address], [place], [place] [address]    Report Status 08/04/2021 FINAL  Final  Blood Culture (routine x 2)     Status: None   Collection Time: 07/30/21  5:51 AM   Specimen: BLOOD  Result Value Ref Range Status   Specimen Description BLOOD RIGHT FOREARM  Final   Special Requests   Final    BOTTLES DRAWN AEROBIC AND ANAEROBIC Blood Culture adequate volume   Culture   Final    NO GROWTH 5 DAYS Performed at [facility], [address], [place], [place] [address]    Report Status 08/04/2021 FINAL  Final  Urine Culture     Status: Abnormal   Collection Time: 07/30/21  5:51 AM   Specimen: Urine, Random  Result Value Ref Range Status   Specimen Description   Final    URINE, RANDOM Performed at [facility], [address], [place], [place] [address]    Special Requests   Final    NONE Performed at [facility], [address], [place], [place] [address]    Culture (A)  Final    <10,000 COLONIES/mL INSIGNIFICANT GROWTH Performed at [facility], [place] [address] [place],  [address]    Report Status 07/31/2021 FINAL  Final  Resp Panel by RT-PCR (Flu A&B, Covid) Nasopharyngeal Swab     Status: None   Collection Time: 07/30/21  5:51 AM  Specimen: Nasopharyngeal Swab; Nasopharyngeal(NP) swabs in vial transport medium  Result Value Ref Range  Status   SARS Coronavirus 2 by RT PCR NEGATIVE NEGATIVE Final    Comment: (NOTE) SARS-CoV-2 target nucleic acids are NOT DETECTED.  The SARS-CoV-2 RNA is generally detectable in upper respiratory specimens during the acute phase of infection. The lowest concentration of SARS-CoV-2 viral copies this assay can detect is 138 copies/mL. A negative result does not preclude SARS-Cov-2 infection and should not be used as the sole basis for treatment or other patient management decisions. A negative result may occur with  improper specimen collection/handling, submission of specimen other than nasopharyngeal swab, presence of viral mutation(s) within the areas targeted by this assay, and inadequate number of viral copies(<138 copies/mL). A negative result must be combined with clinical observations, patient history, and epidemiological information. The expected result is Negative.  Fact Sheet for Patients:  EntrepreneurPulse.com.au  Fact Sheet for Healthcare Providers:  IncredibleEmployment.be  This test is no t yet approved or cleared by the [place] FDA and  has been authorized for detection and/or diagnosis of SARS-CoV-2 by FDA under an Emergency Use Authorization (EUA). This EUA will remain  in effect (meaning this test can be used) for the duration of the COVID-19 declaration under Section 564(b)(1) of the Act, 21 U.S.C.section 360bbb-3(b)(1), unless the authorization is terminated  or revoked sooner.       Influenza A by PCR NEGATIVE NEGATIVE Final   Influenza B by PCR NEGATIVE NEGATIVE Final    Comment: (NOTE) The Xpert Xpress SARS-CoV-2/FLU/RSV plus assay is intended as an aid in the diagnosis of influenza from Nasopharyngeal swab specimens and should not be used as a sole basis for treatment. Nasal washings and aspirates are unacceptable for Xpert Xpress SARS-CoV-2/FLU/RSV testing.  Fact Sheet for  Patients: EntrepreneurPulse.com.au  Fact Sheet for Healthcare Providers: IncredibleEmployment.be  This test is not yet approved or cleared by the [place] FDA and has been authorized for detection and/or diagnosis of SARS-CoV-2 by FDA under an Emergency Use Authorization (EUA). This EUA will remain in effect (meaning this test can be used) for the duration of the COVID-19 declaration under Section 564(b)(1) of the Act, 21 U.S.C. section 360bbb-3(b)(1), unless the authorization is terminated or revoked.  Performed at [facility], [address], [place], [place] [address]          Radiology Studies: [facility] Chest [place] 1 View  Result Date: 08/03/2021 CLINICAL DATA:  Pneumonitis.  History of cancer. EXAM: PORTABLE CHEST 1 VIEW COMPARISON:  July 29, 2021 FINDINGS: Diffuse bilateral patchy pulmonary infiltrates. Stable left Port-A-Cath. No pneumothorax. No change in the cardiomediastinal silhouette. IMPRESSION: New diffuse bilateral pulmonary patchy infiltrates. The findings are most consistent with multifocal pneumonia. Edema considered less likely. Electronically Signed   By: Dorise Bullion III M.D.   On: 08/03/2021 08:16      Scheduled Meds:  apixaban  5 mg Oral BID   Chlorhexidine Gluconate Cloth  6 each Topical Daily   docusate sodium  100 mg Oral Daily   fentaNYL  1 patch Transdermal Q72H   gabapentin  600 mg Oral TID   influenza vaccine adjuvanted  0.5 mL Intramuscular Tomorrow-1000   methylPREDNISolone (SOLU-MEDROL) injection  40 mg Intravenous BID   metoprolol tartrate  25 mg Oral BID   mometasone-formoterol  2 puff Inhalation BID   multivitamin with minerals  1 tablet Oral Daily   oxcarbazepine  600 mg Oral BID   sodium chloride flush  10-40 mL  Intracatheter Q12H   Continuous Infusions:     LOS: 5 days      Debbe Odea, MD [facility] Pager: [email or web address] 08/04/2021, 2:33 PM

## 2021-08-04 NOTE — Progress Notes (Signed)
   08/04/21 1200  Clinical Encounter Type  Visited With Patient  Visit Type Initial;Spiritual support;Social support  Referral From Nurse  Consult/Referral To Chaplain   Chaplain did AD education with PT. Chaplain offered emotional and spiritual support. PT will notify nurse when AD booklet has been filled out,  so on-call Chaplain can return to have it notarized.  Andee Poles, M. Div.

## 2021-08-04 NOTE — Progress Notes (Signed)
Physical Therapy Treatment Patient Details Name: Shannon Schroeder MRN: [id number] DOB: 1956/06/11 Today's Date: 08/04/2021   History of Present Illness Pt is a 65 y/o F admitted on 07/30/21 after presenting with c/o worsening malaise. CT chest done showing multifocal GGO centrally predominant with generous PA diameter. Pt is being treated for Bilateral multifocal pneumonia. 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 Pt agreeable to therapy. Pt performed bed mobility Mod I, STS with SUP and ambulation with CGA to steady. Pt does continue to demo significantly limited functional endurance with maximal gait distance of 63f prior to report of fatigue and note of oxygen desaturation. Pt experienced decreased desaturation levels today however ambulated on increased levels of O2 - 4L to ambulate and recover in today's session. Pt ended session in recliner with all needs met. Would benefit from skilled PT to address above deficits and promote optimal return to PLOF.    Recommendations for follow up therapy are one component of a multi-disciplinary discharge planning process, led by the attending physician.  Recommendations may be updated based on patient status, additional functional criteria and insurance authorization.  Follow Up Recommendations  Supervision for mobility/OOB;SNF     Equipment Recommendations  3in1 (PT)    Recommendations for Other Services       Precautions / Restrictions Precautions Precautions: Fall Restrictions Weight Bearing Restrictions: No     Mobility  Bed Mobility Overal bed mobility: Modified Independent             General bed mobility comments: increased time/effort with use of bed features    Transfers Overall  transfer level: Needs assistance Equipment used: Rolling walker (2 wheeled) Transfers: Sit to/from Stand Sit to Stand: Supervision         General transfer comment: SUP for safety  Ambulation/Gait Ambulation/Gait assistance: Supervision Gait Distance (Feet): 20 Feet Assistive device: Rolling walker (2 wheeled) Gait Pattern/deviations: WFL(Within Functional Limits);Step-through pattern;Decreased step length - right;Decreased step length - left;Trunk flexed;Decreased stride length;Narrow base of support Gait velocity: decreased   General Gait Details: Ambulates 237fx2 reps with seated rest break, decreased step length/foot clearance bil, and narrow BOS. 4L O2 utilized to ambulate; SpO2 immediately after ambulation >90% with both sets of walking. Delayed desaturation to 86% with recovery on 4L.   Stairs             Wheelchair Mobility    Modified Rankin (Stroke Patients Only)       Balance Overall balance assessment: Needs assistance Sitting-balance support: Feet supported;Bilateral upper extremity supported Sitting balance-Leahy Scale: Good     Standing balance support: During functional activity;No upper extremity supported Standing balance-Leahy Scale: Fair Standing balance comment: B UE support in RW - repots feeling weak                            Cognition Arousal/Alertness: Awake/alert Behavior During Therapy: WFL for tasks assessed/performed Overall Cognitive Status: Within Functional Limits for tasks assessed                                        Exercises      General  Comments        Pertinent Vitals/Pain Pain Assessment: No/denies pain    Home Living                      Prior Function            PT Goals (current goals can now be found in the care plan section) Acute Rehab PT Goals Patient Stated Goal: go home PT Goal Formulation: With patient Time For Goal Achievement: 08/16/21 Potential to  Achieve Goals: Fair    Frequency    Min 2X/week      PT Plan      Co-evaluation              AM-PAC PT "6 Clicks" Mobility   Outcome Measure  Help needed turning from your back to your side while in a flat bed without using bedrails?: None Help needed moving from lying on your back to sitting on the side of a flat bed without using bedrails?: None Help needed moving to and from a bed to a chair (including a wheelchair)?: A Little Help needed standing up from a chair using your arms (e.g., wheelchair or bedside chair)?: A Little Help needed to walk in hospital room?: A Little Help needed climbing 3-5 steps with a railing? : A Lot 6 Click Score: 19    End of Session Equipment Utilized During Treatment: Oxygen;Gait belt Activity Tolerance: Patient limited by fatigue;Treatment limited secondary to medical complications (Comment) (desaturation) Patient left: with chair alarm set;in chair;with call bell/phone within reach Nurse Communication: Mobility status (O2) PT Visit Diagnosis: Muscle weakness (generalized) (M62.81);Difficulty in walking, not elsewhere classified (R26.2);Unsteadiness on feet (R26.81)     Time: 9323-5573 PT Time Calculation (min) (ACUTE ONLY): 29 min  Charges:  $Therapeutic Activity: 23-37 mins                     Shannon Schroeder PT, DPT 08/04/21 5:06 PM [phone]    Shannon Schroeder 08/04/2021, 5:02 PM

## 2021-08-04 NOTE — Care Management Important Message (Signed)
Important Message  Patient Details  Name: Shannon Schroeder MRN: [id number] Date of Birth: Oct 29, 1956   Medicare Important Message Given:  Yes     Shenice Dolder, Leroy Sea 08/04/2021, 2:12 PM

## 2021-08-04 NOTE — Progress Notes (Signed)
Hematology/Oncology Progress Note [facility] [phone] [phone]  Patient Care Team: Pcp, No as PCP - General Rockey Situ Kathlene November, MD as PCP - Cardiology (Cardiology) Earlie Server, MD as Consulting Physician (Oncology)   Name of the patient: Shannon Schroeder  [id number]  [birth year]  Date of visit: 08/04/21   INTERVAL HISTORY-  Patient had speech swallow evaluation done today.  Results is pending. Remains on nasal cannula oxygen. Has had hemoptysis over the weekend reduction of hemoglobin..  Pulmonology was consulted. Today she reports no additional hemoptysis episodes.    Review of systems- Review of Systems  Constitutional:  Positive for appetite change, fatigue and unexpected weight change. Negative for chills and fever.  HENT:   Negative for hearing loss and voice change.   Eyes:  Negative for eye problems.  Respiratory:  Positive for shortness of breath and wheezing. Negative for chest tightness and cough.   Cardiovascular:  Negative for chest pain.  Gastrointestinal:  Negative for abdominal distention, abdominal pain, blood in stool, nausea and vomiting.  Endocrine: Negative for hot flashes.  Genitourinary:  Negative for difficulty urinating and frequency.   Musculoskeletal:  Negative for arthralgias.  Skin:  Negative for itching and rash.  Neurological:  Negative for extremity weakness.  Hematological:  Negative for adenopathy.  Psychiatric/Behavioral:  Negative for confusion.    Allergies  Allergen Reactions   Naproxen Swelling, Rash and Other (See Comments)   Aspirin Swelling and Other (See Comments)   Belladonna Alkaloids Rash   Fluoxetine Hives and Rash   Fluoxetine Hcl Other (See Comments) and Rash   Paroxetine Hcl Rash and Other (See Comments)   Phenobarbital Rash and Other (See Comments)   Prozac [Fluoxetine Hcl] Rash      Current Facility-Administered Medications:    acetaminophen (TYLENOL) suppository 650 mg,  650 mg, Rectal, Q4H PRN, Sharion Settler, NP, 650 mg at 07/31/21 2055   apixaban (ELIQUIS) tablet 5 mg, 5 mg, Oral, BID, Agbata, Tochukwu, MD, 5 mg at 08/04/21 0818   Chlorhexidine Gluconate Cloth 2 % PADS 6 each, 6 each, Topical, Daily, Debbe Odea, MD, 6 each at 08/04/21 0819   docusate sodium (COLACE) capsule 100 mg, 100 mg, Oral, Daily, Agbata, Tochukwu, MD, 100 mg at 08/03/21 1054   fentaNYL (DURAGESIC) 75 MCG/HR 1 patch, 1 patch, Transdermal, Q72H, Agbata, Tochukwu, MD   gabapentin (NEURONTIN) capsule 600 mg, 600 mg, Oral, TID, Agbata, Tochukwu, MD, 600 mg at 08/04/21 1600   influenza vaccine adjuvanted (FLUAD) injection 0.5 mL, 0.5 mL, Intramuscular, Tomorrow-1000, Agbata, Tochukwu, MD   ipratropium-albuterol (DUONEB) 0.5-2.5 (3) MG/3ML nebulizer solution 3 mL, 3 mL, Nebulization, Q6H PRN, Agbata, Tochukwu, MD   melatonin tablet 10 mg, 10 mg, Oral, QHS PRN, Agbata, Tochukwu, MD   methylPREDNISolone sodium succinate (SOLU-MEDROL) 40 mg/mL injection 40 mg, 40 mg, Intravenous, BID, Aleskerov, Fuad, MD, 40 mg at 08/04/21 0818   metoprolol tartrate (LOPRESSOR) tablet 25 mg, 25 mg, Oral, BID, Rizwan, Saima, MD, 25 mg at 08/03/21 2040   mometasone-formoterol (DULERA) 100-5 MCG/ACT inhaler 2 puff, 2 puff, Inhalation, BID, Agbata, Tochukwu, MD, 2 puff at 08/04/21 0818   multivitamin with minerals tablet 1 tablet, 1 tablet, Oral, Daily, Agbata, Tochukwu, MD, 1 tablet at 07/30/21 1216   Oxcarbazepine (TRILEPTAL) tablet 600 mg, 600 mg, Oral, BID, Agbata, Tochukwu, MD, 600 mg at 08/04/21 0819   oxyCODONE (Oxy IR/ROXICODONE) immediate release tablet 10-20 mg, 10-20 mg, Oral, Q4H PRN, Rito Ehrlich A, RPH, 10 mg at 08/03/21 2035   polyethylene  glycol (MIRALAX / GLYCOLAX) packet 17 g, 17 g, Oral, Daily PRN, Agbata, Tochukwu, MD   sodium chloride flush (NS) 0.9 % injection 10-40 mL, 10-40 mL, Intracatheter, Q12H, Rizwan, Saima, MD, 10 mL at 08/04/21 0820   sodium chloride flush (NS) 0.9 % injection 10-40  mL, 10-40 mL, Intracatheter, PRN, Debbe Odea, MD   traZODone (DESYREL) tablet 50 mg, 50 mg, Oral, QHS PRN, Rito Ehrlich A, RPH, 50 mg at 08/03/21 2036  Facility-Administered Medications Ordered in Other Encounters:    heparin lock flush 100 UNIT/ML injection, , , ,    heparin lock flush 100 UNIT/ML injection, , , ,    heparin lock flush 100 unit/mL, 500 Units, Intravenous, Once, Earlie Server, MD   heparin lock flush 100 unit/mL, 500 Units, Intracatheter, Once PRN, Earlie Server, MD   sodium chloride flush (NS) 0.9 % injection 10 mL, 10 mL, Intravenous, PRN, Earlie Server, MD, 10 mL at 10/10/20 1040   sodium chloride flush (NS) 0.9 % injection 10 mL, 10 mL, Intravenous, PRN, Earlie Server, MD, 10 mL at 11/13/20 0836   sodium chloride flush (NS) 0.9 % injection 10 mL, 10 mL, Intravenous, PRN, Earlie Server, MD, 10 mL at 12/30/20 0838   sodium chloride flush (NS) 0.9 % injection 10 mL, 10 mL, Intravenous, PRN, Earlie Server, MD, 10 mL at 01/14/21 0921   sodium chloride flush (NS) 0.9 % injection 10 mL, 10 mL, Intravenous, PRN, Earlie Server, MD, 10 mL at 06/11/21 0815   sodium chloride flush (NS) 0.9 % injection 10 mL, 10 mL, Intravenous, PRN, Earlie Server, MD, 10 mL at 07/09/21 0814   Physical exam:  Vitals:   08/04/21 0745 08/04/21 0747 08/04/21 0749 08/04/21 1118  BP:    122/72  Pulse:    76  Resp:    16  Temp:    98.1 F (36.7 C)  TempSrc:      SpO2: (!) 82% 92% 93% 99%  Weight:      Height:       Physical Exam Constitutional:      General: She is not in acute distress.    Appearance: She is not diaphoretic.  HENT:     Head: Normocephalic and atraumatic.     Nose: Nose normal.     Mouth/Throat:     Pharynx: No oropharyngeal exudate.     Comments:  Chronic right side palate deficiency. Right facial swelling Eyes:     General: No scleral icterus.    Pupils: Pupils are equal, round, and reactive to light.  Cardiovascular:     Rate and Rhythm: Normal rate and regular rhythm.     Heart sounds: No murmur  heard. Pulmonary:     Effort: Pulmonary effort is normal. No respiratory distress.     Breath sounds: No wheezing or rales.  Abdominal:     General: There is no distension.     Palpations: Abdomen is soft.     Tenderness: There is no abdominal tenderness.  Musculoskeletal:        General: Normal range of motion.     Cervical back: Normal range of motion and neck supple.  Skin:    General: Skin is warm and dry.     Findings: No erythema.  Neurological:     Mental Status: She is alert and oriented to person, place, and time.     Cranial Nerves: No cranial nerve deficit.     Motor: No abnormal muscle tone.     Coordination: Coordination normal.  Psychiatric:        Mood and Affect: Mood and affect normal.       CMP Latest Ref Rng & Units 08/01/2021  Glucose 70 - 99 mg/dL 95  BUN 8 - 23 mg/dL 18  Creatinine 0.44 - 1.00 mg/dL 0.33(L)  Sodium 135 - 145 mmol/L 141  Potassium 3.5 - 5.1 mmol/L 3.9  Chloride 98 - 111 mmol/L 105  CO2 22 - 32 mmol/L 21(L)  Calcium 8.9 - 10.3 mg/dL 7.9(L)  Total Protein 6.5 - 8.1 g/dL -  Total Bilirubin 0.3 - 1.2 mg/dL -  Alkaline Phos 38 - 126 U/L -  AST 15 - 41 U/L -  ALT 0 - 44 U/L -   CBC Latest Ref Rng & Units 08/02/2021  WBC 4.0 - 10.5 K/uL 11.2(H)  Hemoglobin 12.0 - 15.0 g/dL 8.6(L)  Hematocrit 36.0 - 46.0 % 26.6(L)  Platelets 150 - 400 K/uL 610(H)    RADIOGRAPHIC STUDIES: I have personally reviewed the radiological images as listed and agreed with the findings in the report. CT CHEST ABDOMEN PELVIS W CONTRAST  Result Date: 07/30/2021 CLINICAL DATA:  65 year old female with history of multilobar pneumonia on chest x-ray and abdominal pain. 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 Possible sepsis. EXAM: CT CHEST, ABDOMEN, AND PELVIS WITH CONTRAST TECHNIQUE: Multidetector CT imaging of the chest, abdomen and pelvis was performed following the standard protocol during bolus administration of intravenous contrast. CONTRAST:  93mL OMNIPAQUE  IOHEXOL 350 MG/ML SOLN COMPARISON:  PET-CT 07/08/2021. FINDINGS: CT CHEST FINDINGS Cardiovascular: Heart size is normal. There is no significant pericardial fluid, thickening or pericardial calcification. Aortic atherosclerosis. No definite coronary artery calcifications. Left internal jugular single-lumen porta cath with tip terminating in the distal superior vena cava. Mediastinum/Nodes: Multiple prominent but nonenlarged mediastinal lymph nodes are nonspecific. No definite pathologic lymphadenopathy identified in mediastinal or hilar nodal stations. Esophagus is unremarkable in appearance. No axillary lymphadenopathy. Lungs/Pleura: Worsening patchy multifocal areas of ground-glass attenuation, septal thickening and thickening of the peribronchovascular interstitium is seen throughout the lungs bilaterally with no discernible craniocaudal gradient. No confluent consolidative airspace disease. No pleural effusions. No definite suspicious appearing pulmonary nodules or masses are noted. Musculoskeletal: There are no aggressive appearing lytic or blastic lesions noted in the visualized portions of the skeleton. CT ABDOMEN PELVIS FINDINGS Hepatobiliary: No suspicious cystic or solid hepatic lesions. No intra or extrahepatic biliary ductal dilatation. Status post cholecystectomy. Pancreas: No pancreatic mass. No pancreatic ductal dilatation. No pancreatic or peripancreatic fluid collections or inflammatory changes. Spleen: Unremarkable. Adrenals/Urinary Tract: Bilateral kidneys and adrenal glands are normal in appearance. No hydroureteronephrosis. Urinary bladder is normal in appearance. Stomach/Bowel: The appearance of the stomach is normal. There is no pathologic dilatation of small bowel or colon. Normal appendix. Vascular/Lymphatic: Aortic atherosclerosis, without evidence of aneurysm or dissection in the abdominal or pelvic vasculature. No lymphadenopathy noted in the abdomen or pelvis. Reproductive: Status post  hysterectomy. Ovaries are not confidently identified may be surgically absent or atrophic. Other: No significant volume of ascites.  No pneumoperitoneum. Musculoskeletal: Chronic compression fracture of anterior aspect of the superior endplate of L1, similar to the prior study from 05/01/2021 with minimal loss of anterior vertebral body height. There are no aggressive appearing lytic or blastic lesions noted in the visualized portions of the skeleton. IMPRESSION: 1. Marked worsening multifocal ground-glass attenuation and septal thickening in the lungs bilaterally when compared to prior PET-CT 07/08/2021. Findings are favored to reflect a drug-induced pneumonitis, but could alternatively reflect multilobar bilateral pneumonia  from atypical infection. Further clinical evaluation is recommended. 2. No acute findings are noted in the abdomen or pelvis. 3. No definite signs of metastatic disease in the chest, abdomen or pelvis. 4. Aortic atherosclerosis. 5. Additional incidental findings, as above. Electronically Signed   By: Vinnie Langton M.D.   On: 07/30/2021 06:53   NM PET Image Restag (PS) Skull Base To Thigh  Result Date: 07/09/2021 CLINICAL DATA:  Subsequent treatment strategy for head/neck cancer. EXAM: NUCLEAR MEDICINE PET SKULL BASE TO THIGH TECHNIQUE: 6.1 mCi F-18 FDG was injected intravenously. Full-ring PET imaging was performed from the skull base to thigh after the radiotracer. CT data was obtained and used for attenuation correction and anatomic localization. Fasting blood glucose: 91 mg/dl COMPARISON:  CT abdomen 05/01/2021 and PET 01/09/2021. CT chest 09/26/2020. 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 No hypermetabolic lymph nodes. Incidental CT findings:  Associated osseous destruction surrounding the right maxillary sinus. CHEST: No hypermetabolic mediastinal, hilar or axillary lymph nodes. No hypermetabolic pulmonary nodules. Incidental CT findings: Left IJ Port-A-Cath terminates in the SVC. Heart is at the upper limits of normal in size. No pericardial or pleural effusion. Basilar predominant peribronchovascular ground-glass and mild septal thickening. ABDOMEN/PELVIS: No abnormal hypermetabolism in the liver, adrenal glands, spleen or pancreas. No hypermetabolic lymph nodes. Incidental CT findings: Liver is unremarkable. Cholecystectomy. Adrenal glands, kidneys, spleen, pancreas, stomach and bowel are grossly unremarkable. Atherosclerotic calcification of the aorta. SKELETON: No abnormal osseous hypermetabolism. Very mild hypermetabolism associated with a fracture of the superior endplate of L1 is new from 01/09/2021. Incidental CT findings: Degenerative changes in the spine. IMPRESSION: 1. Continued response to therapy as evidenced by decreased wall thickening and hypermetabolism associated with a cavitary right maxillary/nasopharyngeal mass. No evidence of distant metastatic disease. 2. Subacute compression of the L1 superior endplate, new from 01/09/2021. 3.  Aortic atherosclerosis (ICD10-I70.0). Electronically Signed   By: Lorin Picket M.D.   On: 07/09/2021 13:28   DG Chest Port 1 View  Result Date: 08/03/2021 CLINICAL DATA:  Pneumonitis.  History of cancer. EXAM: PORTABLE CHEST 1 VIEW COMPARISON:  July 29, 2021 FINDINGS: Diffuse bilateral patchy pulmonary infiltrates. Stable left Port-A-Cath. No pneumothorax. No change in the cardiomediastinal silhouette. IMPRESSION: New diffuse bilateral pulmonary patchy infiltrates. The findings are most consistent with multifocal pneumonia. Edema considered less likely. Electronically Signed   By: Dorise Bullion III M.D.   On: 08/03/2021 08:16   DG Chest Port 1 View  Result Date: 07/29/2021 CLINICAL DATA:   65 year old female with history of cough and weakness for the past several days. EXAM: PORTABLE CHEST 1 VIEW COMPARISON:  Chest x-ray 12/30/2020. FINDINGS: Left-sided internal jugular single-lumen porta cath with tip terminating in the distal superior vena cava. Lung volumes are normal. Diffuse interstitial prominence and peribronchial cuffing. No confluent consolidative airspace disease. No pleural effusions. No pneumothorax. No pulmonary nodule or mass noted. Pulmonary vasculature and the cardiomediastinal silhouette are within normal limits. Atherosclerotic calcifications in the thoracic aorta. IMPRESSION: 1. The appearance the chest suggests severe bronchitis, potentially with developing multilobar bronchopneumonia. 2. Aortic atherosclerosis. Electronically Signed   By: Vinnie Langton M.D.   On: 07/29/2021 19:39    Assessment and plan-   #acute respiratory failure.  Multifocal aspiration pneumonia versus drug-induced pneumonitis. Hemoptysis Pulmonology recommended reviewed.  ? Alevolar  hemorrhage in the context of pneumonitis.  Continue IV Steroids.  Aspiration precaution.  She finishes pneumonia treatment with Ceftriaxon and Azithromycin today Wean Oxygen as tolerated.    #COPD, continue inhalers.  on IV steroids.  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  Hold treatment due to acute issue. Cautious and very challenging with immunotherapy in the future due to the possibility of immunotherapy induced pneumonitis.   Palliative care service follow up   Neoplasm related pain Continue Fentanyl patch and oxycodone PRN Continue gabapentin and Trileptal.   Palliative care evaluation. OT/PT  Thank you for allowing me to participate in the care of this patient.   Earlie Server, MD, PhD 08/04/2021

## 2021-08-04 NOTE — Progress Notes (Signed)
   08/04/21 0145  Clinical Encounter Type  Visited With Patient  Visit Type Follow-up;Spiritual support;Social support  Referral From Nurse  Consult/Referral To [employer] completed AD with notary and 2 witnesses.    Chaplain Debarah Mccumbers. M. Freddie Breech

## 2021-08-04 NOTE — Progress Notes (Signed)
Pulmonary Medicine          Date: 08/04/2021,   MRN# [id number] Shannon Schroeder Nov 18, 1955     AdmissionWeight: 57.3 kg                 CurrentWeight: 57.3 kg   Referring physician: Dr Raul Del    CHIEF COMPLAINT:   Multifocal ground glass infiltrates of bilateral lungs   HISTORY OF PRESENT ILLNESS    This is a 65 y.o. female with medical history significant for AF on chronic anticoagulation therapy, history of head and neck cancer on chemotherapy and immunotherapy, history of hypertension who presents to the emergency room for evaluation of worsening malaise. Currently on chemo per med-onc Dr Tasia Catchings.  Reports NV symptoms post chemo.  She has chronic dysphagia.  Reports over last week worsening dyspnea on exertion . Initial labs were unremarkable except for chronic anemia.  RVP was negative.  CT chest done showing multifocal GGO centrally predominant with generous PA diameter. PCCM consult for further evaluation and treatment.   08/02/21- patient improved clinically.  She reports less dyspnea. She is developing crackles at bases bilaterally, will dc IVF NS 176ml/h patient is euvolemic.  Reducing steroids to 40 bid solumedrol IV.  PT/OT as ordered. CBC w diff repeat today due to hemoptysis with reduced h/h.    08/03/21-  patient is improved, she is participating with PT/OT now doing well with slight and transient desaturation and dyspnea to [facility] level. She had CXR with persistent airspace opacification bilaterally.  Radiography generally lags behind clinical improvement and I suspect this will get better over next 8 wks. Will test for other less likely possibilites via serology today including fungal/PCP.  CBC with diff consistent with anemia acutely with reactive thrombocytosis.  Appreciate collaboration with everyone involved.   08/04/21- patient was unable to ambulate due to severe dyspnea.  Inflammatory biomarkers are elevated and plan is to continue steroids at current dose  40 bid solumedrol.  I have ordered 1x lasix 20 due to crackles at bases posteriorly.  Her spO2 requirement has not worsened but has not improved either in past 24h.       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    Anxiety    Arthritis    Bipolar disorder (HCC)    COPD (chronic obstructive pulmonary disease) (HCC)    NO INHALERS   Depression    Dysrhythmia    H/O V TACH   Head and neck cancer ([place]) 07/29/2020   Headache    H/O MIGRAINES   Hypertension    Seizures ([place])    X1 AFTER FALL     SURGICAL HISTORY   Past Surgical History:  Procedure Laterality Date   ABDOMINAL HYSTERECTOMY     partial   BREAST SURGERY     biopsy   CARDIAC CATHETERIZATION     X 2   CARPAL TUNNEL RELEASE Right    CARPECTOMY HAND Right    CATARACT EXTRACTION W/PHACO Left 03/08/2018   Procedure: CATARACT EXTRACTION PHACO AND INTRAOCULAR LENS PLACEMENT ([place]);  Surgeon: Birder Robson, MD;  Location: [facility] ORS;  Service: Ophthalmology;  Laterality: Left;  [place] 00:55 AP% 13.5 CDE 7.52 Fluid pack lot # [id number]   CATARACT EXTRACTION W/PHACO Right 04/05/2018   Procedure: CATARACT EXTRACTION PHACO AND INTRAOCULAR LENS PLACEMENT ([facility]);  Surgeon: Birder Robson, MD;  Location: [facility] ORS;  Service: Ophthalmology;  Laterality: Right;  [place] 00:36 AP% 15.9 CDE 5.72 Fluid pack lot # [id number]   CEREBRAL ANEURYSM REPAIR     CHOLECYSTECTOMY     COLONOSCOPY WITH PROPOFOL N/A 04/26/2019   Procedure: COLONOSCOPY WITH PROPOFOL;  Surgeon: Jonathon Bellows, MD;  Location: [facility] ENDOSCOPY;  Service: Gastroenterology;  Laterality: N/A;   PORTACATH PLACEMENT Left 08/05/2020   Procedure: INSERTION PORT-A-CATH;  Surgeon: Herbert Pun, MD;  Location: [facility] ORS;  Service: General;  Laterality: Left;   TUBAL LIGATION       FAMILY HISTORY   Family History  Problem Relation Age of Onset   Hypertension Mother     Heart failure Mother    Hypertension Brother    Stroke Brother    Hypertension Son    Emphysema Maternal Aunt    Hypertension Paternal Aunt    Hypertension Paternal Uncle    Hypertension Maternal Grandmother    Hypertension Paternal Grandmother      SOCIAL HISTORY   Social History   Tobacco Use   Smoking status: Every Day    Packs/day: 0.25    Years: 50.00    Pack years: 12.50    Types: Cigarettes   Smokeless tobacco: Never  Vaping Use   Vaping Use: Never used  Substance Use Topics   Alcohol use: No   Drug use: No     MEDICATIONS    Home Medication:    Current Medication:  Current Facility-Administered Medications:    acetaminophen (TYLENOL) suppository 650 mg, 650 mg, Rectal, Q4H PRN, Sharion Settler, NP, 650 mg at 07/31/21 2055   apixaban (ELIQUIS) tablet 5 mg, 5 mg, Oral, BID, Agbata, Tochukwu, MD, 5 mg at 08/03/21 2036   Chlorhexidine Gluconate Cloth 2 % PADS 6 each, 6 each, Topical, Daily, Rizwan, Eunice Blase, MD, 6 each at 08/03/21 1105   docusate sodium (COLACE) capsule 100 mg, 100 mg, Oral, Daily, Agbata, Tochukwu, MD, 100 mg at 08/03/21 1054   fentaNYL (DURAGESIC) 75 MCG/HR 1 patch, 1 patch, Transdermal, Q72H, Agbata, Tochukwu, MD   gabapentin (NEURONTIN) capsule 600 mg, 600 mg, Oral, TID, Agbata, Tochukwu, MD, 600 mg at 08/03/21 2037   influenza vaccine adjuvanted (FLUAD) injection 0.5 mL, 0.5 mL, Intramuscular, Tomorrow-1000, Agbata, Tochukwu, MD   ipratropium-albuterol (DUONEB) 0.5-2.5 (3) MG/3ML nebulizer solution 3 mL, 3 mL, Nebulization, Q6H PRN, Agbata, Tochukwu, MD   melatonin tablet 10 mg, 10 mg, Oral, QHS PRN, Agbata, Tochukwu, MD   methylPREDNISolone sodium succinate (SOLU-MEDROL) 40 mg/mL injection 40 mg, 40 mg, Intravenous, BID, Lanney Gins, Dameion Briles, MD, 40 mg at 08/03/21 2037   metoprolol tartrate (LOPRESSOR) tablet 25 mg, 25 mg, Oral, BID, Rizwan, Saima, MD, 25 mg at 08/03/21 2040   mometasone-formoterol (DULERA) 100-5 MCG/ACT inhaler 2 puff, 2 puff,  Inhalation, BID, Agbata, Tochukwu, MD, 2 puff at 08/03/21 2035   multivitamin with minerals tablet 1 tablet, 1 tablet, Oral, Daily, Agbata, Tochukwu, MD, 1 tablet at 07/30/21 1216   Oxcarbazepine (TRILEPTAL) tablet 600 mg, 600 mg, Oral, BID, Agbata, Tochukwu, MD, 600 mg at 08/03/21 2039   oxyCODONE (Oxy IR/ROXICODONE) immediate release tablet 10-20 mg, 10-20 mg, Oral, Q4H PRN, Rito Ehrlich A, RPH, 10 mg at 08/03/21 2035   polyethylene glycol (MIRALAX / GLYCOLAX) packet 17 g, 17 g, Oral, Daily PRN, Agbata, Tochukwu, MD   sodium chloride flush (NS) 0.9 % injection 10-40 mL, 10-40 mL, Intracatheter, Q12H, Rizwan, Saima, MD, 10 mL at 08/03/21 2328   sodium chloride flush (NS) 0.9 % injection 10-40 mL, 10-40 mL, Intracatheter, PRN, Rizwan,  Eunice Blase, MD   traZODone (DESYREL) tablet 50 mg, 50 mg, Oral, QHS PRN, Rito Ehrlich A, RPH, 50 mg at 08/03/21 2036  Facility-Administered Medications Ordered in Other Encounters:    heparin lock flush 100 UNIT/ML injection, , , ,    heparin lock flush 100 UNIT/ML injection, , , ,    heparin lock flush 100 unit/mL, 500 Units, Intravenous, Once, Earlie Server, MD   heparin lock flush 100 unit/mL, 500 Units, Intracatheter, Once PRN, Earlie Server, MD   sodium chloride flush (NS) 0.9 % injection 10 mL, 10 mL, Intravenous, PRN, Earlie Server, MD, 10 mL at 10/10/20 1040   sodium chloride flush (NS) 0.9 % injection 10 mL, 10 mL, Intravenous, PRN, Earlie Server, MD, 10 mL at 11/13/20 0836   sodium chloride flush (NS) 0.9 % injection 10 mL, 10 mL, Intravenous, PRN, Earlie Server, MD, 10 mL at 12/30/20 7793   sodium chloride flush (NS) 0.9 % injection 10 mL, 10 mL, Intravenous, PRN, Earlie Server, MD, 10 mL at 01/14/21 9030   sodium chloride flush (NS) 0.9 % injection 10 mL, 10 mL, Intravenous, PRN, Earlie Server, MD, 10 mL at 06/11/21 0815   sodium chloride flush (NS) 0.9 % injection 10 mL, 10 mL, Intravenous, PRN, Earlie Server, MD, 10 mL at 07/09/21 0923    ALLERGIES   Naproxen, Aspirin, Belladonna  alkaloids, Fluoxetine, Fluoxetine hcl, Paroxetine hcl, Phenobarbital, and Prozac [fluoxetine hcl]     REVIEW OF SYSTEMS    Review of Systems:  Gen:  Denies  fever, sweats, chills weigh loss  HEENT: Denies blurred vision, double vision, ear pain, eye pain, hearing loss, nose bleeds, sore throat Cardiac:  No dizziness, chest pain or heaviness, chest tightness,edema Resp:   Denies cough or sputum porduction, shortness of breath,wheezing, hemoptysis,  Gi: Denies swallowing difficulty, stomach pain, nausea or vomiting, diarrhea, constipation, bowel incontinence Gu:  Denies bladder incontinence, burning urine Ext:   Denies Joint pain, stiffness or swelling Skin: Denies  skin rash, easy bruising or bleeding or hives Endoc:  Denies polyuria, polydipsia , polyphagia or weight change Psych:   Denies depression, insomnia or hallucinations   Other:  All other systems negative   VS: BP 136/90 (BP Location: Left Arm)   Pulse 62   Temp 98.1 F (36.7 C)   Resp 18   Ht 5\' 6"  (1.676 m)   Wt 57.3 kg   LMP 11/19/1988 (Exact Date)   SpO2 97%   BMI 20.39 kg/m      PHYSICAL EXAM    GENERAL:NAD, no fevers, chills, no weakness no fatigue HEAD: Normocephalic, atraumatic.  EYES: Pupils equal, round, reactive to light. Extraocular muscles intact. No scleral icterus.  MOUTH: Moist mucosal membrane. Dentition intact. No abscess noted.  EAR, NOSE, THROAT: Clear without exudates. No external lesions.  NECK: Supple. No thyromegaly. No nodules. No JVD.  PULMONARY: bilateral mild rhonchi with decreased air entry and mild crackles at bases posteriorly  CARDIOVASCULAR: S1 and S2. Regular rate and rhythm. No murmurs, rubs, or gallops. No edema. Pedal pulses 2+ bilaterally.  GASTROINTESTINAL: Soft, nontender, nondistended. No masses. Positive bowel sounds. No hepatosplenomegaly.  MUSCULOSKELETAL: No swelling, clubbing, or edema. Range of motion full in all extremities.  NEUROLOGIC: Cranial nerves II  through XII are intact. No gross focal neurological deficits. Sensation intact. Reflexes intact.  SKIN: No ulceration, lesions, rashes, or cyanosis. Skin warm and dry. Turgor intact.  PSYCHIATRIC: Mood, affect within normal limits. The patient is awake, alert and oriented x 3. Insight, judgment intact.  IMAGING    CT CHEST ABDOMEN PELVIS W CONTRAST  Result Date: 07/30/2021 CLINICAL DATA:  65 year old female with history of multilobar pneumonia on chest x-ray and abdominal pain. 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 Possible sepsis. EXAM: CT CHEST, ABDOMEN, AND PELVIS WITH CONTRAST TECHNIQUE: Multidetector CT imaging of the chest, abdomen and pelvis was performed following the standard protocol during bolus administration of intravenous contrast. CONTRAST:  84mL OMNIPAQUE IOHEXOL 350 MG/ML SOLN COMPARISON:  PET-CT 07/08/2021. FINDINGS: CT CHEST FINDINGS Cardiovascular: Heart size is normal. There is no significant pericardial fluid, thickening or pericardial calcification. Aortic atherosclerosis. No definite coronary artery calcifications. Left internal jugular single-lumen porta cath with tip terminating in the distal superior vena cava. Mediastinum/Nodes: Multiple prominent but nonenlarged mediastinal lymph nodes are nonspecific. No definite pathologic lymphadenopathy identified in mediastinal or hilar nodal stations. Esophagus is unremarkable in appearance. No axillary lymphadenopathy. Lungs/Pleura: Worsening patchy multifocal areas of ground-glass attenuation, septal thickening and thickening of the peribronchovascular interstitium is seen throughout the lungs bilaterally with no discernible craniocaudal gradient. No confluent consolidative airspace disease. No pleural effusions. No definite suspicious appearing pulmonary nodules or masses are noted. Musculoskeletal: There are no aggressive appearing lytic or blastic lesions noted in the visualized portions of the skeleton. CT ABDOMEN  PELVIS FINDINGS Hepatobiliary: No suspicious cystic or solid hepatic lesions. No intra or extrahepatic biliary ductal dilatation. Status post cholecystectomy. Pancreas: No pancreatic mass. No pancreatic ductal dilatation. No pancreatic or peripancreatic fluid collections or inflammatory changes. Spleen: Unremarkable. Adrenals/Urinary Tract: Bilateral kidneys and adrenal glands are normal in appearance. No hydroureteronephrosis. Urinary bladder is normal in appearance. Stomach/Bowel: The appearance of the stomach is normal. There is no pathologic dilatation of small bowel or colon. Normal appendix. Vascular/Lymphatic: Aortic atherosclerosis, without evidence of aneurysm or dissection in the abdominal or pelvic vasculature. No lymphadenopathy noted in the abdomen or pelvis. Reproductive: Status post hysterectomy. Ovaries are not confidently identified may be surgically absent or atrophic. Other: No significant volume of ascites.  No pneumoperitoneum. Musculoskeletal: Chronic compression fracture of anterior aspect of the superior endplate of L1, similar to the prior study from 05/01/2021 with minimal loss of anterior vertebral body height. There are no aggressive appearing lytic or blastic lesions noted in the visualized portions of the skeleton. IMPRESSION: 1. Marked worsening multifocal ground-glass attenuation and septal thickening in the lungs bilaterally when compared to prior PET-CT 07/08/2021. Findings are favored to reflect a drug-induced pneumonitis, but could alternatively reflect multilobar bilateral pneumonia from atypical infection. Further clinical evaluation is recommended. 2. No acute findings are noted in the abdomen or pelvis. 3. No definite signs of metastatic disease in the chest, abdomen or pelvis. 4. Aortic atherosclerosis. 5. Additional incidental findings, as above. Electronically Signed   By: Vinnie Langton M.D.   On: 07/30/2021 06:53   NM PET Image Restag (PS) Skull Base To  Thigh  Result Date: 07/09/2021 CLINICAL DATA:  Subsequent treatment strategy for head/neck cancer. EXAM: NUCLEAR MEDICINE PET SKULL BASE TO THIGH TECHNIQUE: 6.1 mCi F-18 FDG was injected intravenously. Full-ring PET imaging was performed from the skull base to thigh after the radiotracer. CT data was obtained and used for attenuation correction and anatomic localization. Fasting blood glucose: 91 mg/dl COMPARISON:  CT abdomen 05/01/2021 and PET 01/09/2021. CT chest 09/26/2020. 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 No hypermetabolic lymph nodes. Incidental CT findings: Associated osseous destruction surrounding the right maxillary sinus. CHEST: No hypermetabolic mediastinal, hilar or axillary lymph nodes. No hypermetabolic pulmonary nodules. Incidental CT findings: Left IJ Port-A-Cath terminates in the SVC. Heart is at the upper limits of normal in size. No pericardial or pleural effusion. Basilar predominant peribronchovascular ground-glass and mild septal thickening. ABDOMEN/PELVIS: No abnormal hypermetabolism in the liver, adrenal glands, spleen or pancreas. No hypermetabolic lymph nodes. Incidental CT findings: Liver is unremarkable. Cholecystectomy. Adrenal glands, kidneys, spleen, pancreas, stomach and bowel are grossly unremarkable. Atherosclerotic calcification of the aorta. SKELETON: No abnormal osseous hypermetabolism. Very mild hypermetabolism associated with a fracture of the superior endplate of L1 is new from 01/09/2021. Incidental CT findings: Degenerative changes in the spine. IMPRESSION: 1. Continued response to therapy as evidenced by decreased wall thickening and hypermetabolism associated with a cavitary right maxillary/nasopharyngeal mass. No evidence  of distant metastatic disease. 2. Subacute compression of the L1 superior endplate, new from 01/09/2021. 3.  Aortic atherosclerosis (ICD10-I70.0). Electronically Signed   By: Lorin Picket M.D.   On: 07/09/2021 13:28   DG Chest Port 1 View  Result Date: 08/03/2021 CLINICAL DATA:  Pneumonitis.  History of cancer. EXAM: PORTABLE CHEST 1 VIEW COMPARISON:  July 29, 2021 FINDINGS: Diffuse bilateral patchy pulmonary infiltrates. Stable left Port-A-Cath. No pneumothorax. No change in the cardiomediastinal silhouette. IMPRESSION: New diffuse bilateral pulmonary patchy infiltrates. The findings are most consistent with multifocal pneumonia. Edema considered less likely. Electronically Signed   By: Dorise Bullion III M.D.   On: 08/03/2021 08:16   DG Chest Port 1 View  Result Date: 07/29/2021 CLINICAL DATA:  65 year old female with history of cough and weakness for the past several days. EXAM: PORTABLE CHEST 1 VIEW COMPARISON:  Chest x-ray 12/30/2020. FINDINGS: Left-sided internal jugular single-lumen porta cath with tip terminating in the distal superior vena cava. Lung volumes are normal. Diffuse interstitial prominence and peribronchial cuffing. No confluent consolidative airspace disease. No pleural effusions. No pneumothorax. No pulmonary nodule or mass noted. Pulmonary vasculature and the cardiomediastinal silhouette are within normal limits. Atherosclerotic calcifications in the thoracic aorta. IMPRESSION: 1. The appearance the chest suggests severe bronchitis, potentially with developing multilobar bronchopneumonia. 2. Aortic atherosclerosis. Electronically Signed   By: Vinnie Langton M.D.   On: 07/29/2021 19:39      ASSESSMENT/PLAN   Bilateral multifocal pneumonia -suspect this is due to [place] however patient reports hemoptysis bright red blood and has significant decrement in hemoglobin.  Possible Alevolar hemorrhage in context of pnemonitis with concomitant anticoagulation.   Although she is on IV fluids remainder of cell lines are not proportionally reduced which suggests blood loss.  I agree with steroids.  She is improved already and should be better in 48 hours with steroids.  Agree with empiric abx.  She has received only 2 doses of Keytruda and I would suggest trying to give this again next month after improvement. She has dysphagia and higher risk for aspiration. Noted s/p SLP. Will reduce solumedrol to 60 bid today.   -continue solumedrol bid 40 mg- CXR interval worse, DCD IV fluids -finish empiric CAP regimen -Continue PT/OT -Lasix 20 x1 today  Per primary team AF-metoprolol COPD-dulera, she uses advair HFA and Ventolin at home Head/Neck CA- per med onc Dr Tasia Catchings   Thank you for allowing me to participate in the care of this patient.   Patient/Family are satisfied with care plan and all questions have been answered.  This document was  prepared using Systems analyst and may include unintentional dictation errors.     Ottie Glazier, M.D.  Division of [place]

## 2021-08-04 NOTE — ACP (Advance Care Planning) (Signed)
Ad Created as of 08/04/21  Point of contact:  Pollyann Savoy [phone]

## 2021-08-04 NOTE — Consult Note (Signed)
[place]  [phone] [phone]   Name: Shannon Schroeder Date: 08/04/2021 MRN: [id number]  DOB: 07-Jan-1956  Patient Care Team: Pcp, No as PCP - General Rockey Situ Kathlene November, MD as PCP - Cardiology (Cardiology) Earlie Server, MD as Consulting Physician (Oncology)    REASON FOR CONSULTATION: Shannon Schroeder is a 65 y.o. 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  MRI of the brain on 07/17/2020 revealed large right facial mass with right maxillary erosion and perineural spread along multiple branches of the maxillary division and right trigeminal nerve.  Patient has been on treatment with cetuximab and Keytruda.  She was admitted to the hospital 07/30/2021 with hypoxic respiratory failure thought secondary to possible aspiration pneumonia versus medication induced pneumonitis.  Patient was referred to palliative care to help address goals.  SOCIAL HISTORY:     reports that she has been smoking cigarettes. She has a 12.50 pack-year smoking history. She has never used smokeless tobacco. She reports that she does not drink alcohol and does not use drugs.  She has a son and daughter from whom she is estranged.  She lives at home with a roommate.  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    Anxiety    Arthritis    Bipolar disorder (HCC)    COPD (chronic obstructive pulmonary disease) (HCC)    NO INHALERS   Depression    Dysrhythmia    H/O V TACH   Head and neck cancer ([place]) 07/29/2020   Headache    H/O MIGRAINES   Hypertension     Seizures ([place])    X1 AFTER FALL    PAST SURGICAL HISTORY:  Past Surgical History:  Procedure Laterality Date   ABDOMINAL HYSTERECTOMY     partial   BREAST SURGERY     biopsy   CARDIAC CATHETERIZATION     X 2   CARPAL TUNNEL RELEASE Right    CARPECTOMY HAND Right    CATARACT EXTRACTION W/PHACO Left 03/08/2018   Procedure: CATARACT EXTRACTION PHACO AND INTRAOCULAR LENS PLACEMENT ([place]);  Surgeon: Birder Robson, MD;  Location: [facility] ORS;  Service: Ophthalmology;  Laterality: Left;  [place] 00:55 AP% 13.5 CDE 7.52 Fluid pack lot # [id number]   CATARACT EXTRACTION W/PHACO Right 04/05/2018   Procedure: CATARACT EXTRACTION PHACO AND INTRAOCULAR LENS PLACEMENT (I[facility]);  Surgeon: Birder Robson, MD;  Location: [facility] ORS;  Service: Ophthalmology;  Laterality: Right;  [place] 00:36 AP% 15.9 CDE 5.72 Fluid pack lot # [id number]   CEREBRAL ANEURYSM REPAIR     CHOLECYSTECTOMY     COLONOSCOPY WITH PROPOFOL N/A 04/26/2019   Procedure: COLONOSCOPY WITH PROPOFOL;  Surgeon: Jonathon Bellows, MD;  Location: [facility] ENDOSCOPY;  Service: Gastroenterology;  Laterality: N/A;   PORTACATH PLACEMENT Left 08/05/2020   Procedure: INSERTION PORT-A-CATH;  Surgeon: Herbert Pun, MD;  Location: [facility] ORS;  Service: General;  Laterality: Left;   TUBAL LIGATION      HEMATOLOGY/ONCOLOGY HISTORY:  Oncology History  Head and neck cancer ([place])  07/29/2020 Initial Diagnosis   Head and neck cancer ([place])   08/08/2020 - 08/08/2020 Chemotherapy   The patient  had dexamethasone (DECADRON) 4 MG tablet, 8 mg, Oral, Daily, 0 of 1 cycle, Start date: --, End date: -- palonosetron (ALOXI) injection 0.25 mg, 0.25 mg, Intravenous,  Once, 0 of 3 cycles CISplatin (PLATINOL) 166 mg in sodium chloride 0.9 % 500 mL chemo infusion, 100 mg/m2, Intravenous,  Once, 0 of 3 cycles fosaprepitant (EMEND) 150 mg in sodium chloride 0.9 % 145 mL IVPB, 150 mg, Intravenous,  Once, 0 of 3 cycles   for chemotherapy treatment.     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  MEDICATIONS:  Current Facility-Administered Medications  Medication Dose Route Frequency Provider Last Rate Last Admin   acetaminophen (TYLENOL) suppository 650 mg  650 mg Rectal Q4H PRN Sharion Settler, NP   650 mg at 07/31/21 2055   apixaban (ELIQUIS) tablet 5 mg  5 mg Oral BID Agbata, Tochukwu, MD   5 mg at 08/04/21 0818   Chlorhexidine Gluconate Cloth 2 % PADS 6 each  6 each Topical Daily Debbe Odea, MD   6 each at 08/04/21 0819   docusate sodium (COLACE) capsule 100 mg  100 mg Oral Daily Agbata, Tochukwu, MD   100 mg at 08/03/21 1054   fentaNYL (DURAGESIC) 75 MCG/HR 1 patch  1 patch Transdermal Q72H Agbata, Tochukwu, MD       gabapentin (NEURONTIN) capsule 600 mg  600 mg Oral TID Agbata, Tochukwu, MD   600 mg at 08/04/21 0816   influenza vaccine adjuvanted (FLUAD) injection 0.5 mL  0.5 mL Intramuscular Tomorrow-1000 Agbata, Tochukwu, MD       ipratropium-albuterol (DUONEB) 0.5-2.5 (3) MG/3ML nebulizer solution 3 mL  3 mL Nebulization Q6H PRN Agbata, Tochukwu, MD       melatonin tablet 10 mg  10 mg Oral QHS PRN Agbata, Tochukwu, MD       methylPREDNISolone sodium succinate (SOLU-MEDROL) 40 mg/mL injection 40 mg  40 mg Intravenous BID Ottie Glazier, MD   40 mg at 08/04/21 0818   metoprolol tartrate (LOPRESSOR) tablet 25 mg  25 mg Oral BID Debbe Odea, MD   25 mg at 08/03/21 2040   mometasone-formoterol (DULERA) 100-5 MCG/ACT inhaler 2  puff  2 puff Inhalation BID Agbata, Tochukwu, MD   2 puff at 08/04/21 0818   multivitamin with minerals tablet 1 tablet  1 tablet Oral Daily Agbata, Tochukwu, MD   1 tablet at 07/30/21 1216   Oxcarbazepine (TRILEPTAL) tablet 600 mg  600 mg Oral BID Agbata, Tochukwu, MD   600 mg at 08/04/21 0819   oxyCODONE (Oxy IR/ROXICODONE) immediate release tablet 10-20 mg  10-20 mg Oral Q4H PRN Rito Ehrlich A, RPH   10 mg at 08/03/21 2035   polyethylene glycol (MIRALAX / GLYCOLAX) packet 17 g  17 g Oral Daily PRN Agbata, Tochukwu, MD       sodium chloride flush (NS) 0.9 % injection 10-40 mL  10-40 mL Intracatheter Q12H Debbe Odea, MD   10 mL at 08/04/21 0820  sodium chloride flush (NS) 0.9 % injection 10-40 mL  10-40 mL Intracatheter PRN Debbe Odea, MD       traZODone (DESYREL) tablet 50 mg  50 mg Oral QHS PRN Rito Ehrlich A, RPH   50 mg at 08/03/21 2036   Facility-Administered Medications Ordered in Other Encounters  Medication Dose Route Frequency Provider Last Rate Last Admin   heparin lock flush 100 UNIT/ML injection            heparin lock flush 100 UNIT/ML injection            heparin lock flush 100 unit/mL  500 Units Intravenous Once Earlie Server, MD       heparin lock flush 100 unit/mL  500 Units Intracatheter Once PRN Earlie Server, MD       sodium chloride flush (NS) 0.9 % injection 10 mL  10 mL Intravenous PRN Earlie Server, MD   10 mL at 10/10/20 1040   sodium chloride flush (NS) 0.9 % injection 10 mL  10 mL Intravenous PRN Earlie Server, MD   10 mL at 11/13/20 0836   sodium chloride flush (NS) 0.9 % injection 10 mL  10 mL Intravenous PRN Earlie Server, MD   10 mL at 12/30/20 1601   sodium chloride flush (NS) 0.9 % injection 10 mL  10 mL Intravenous PRN Earlie Server, MD   10 mL at 01/14/21 0921   sodium chloride flush (NS) 0.9 % injection 10 mL  10 mL Intravenous PRN Earlie Server, MD   10 mL at 06/11/21 0815   sodium chloride flush (NS) 0.9 % injection 10 mL  10 mL Intravenous PRN Earlie Server, MD   10 mL at 07/09/21 0814     VITAL SIGNS: BP 122/72 (BP Location: Left Arm)   Pulse 76   Temp 98.1 F (36.7 C)   Resp 16   Ht 5\' 6"  (1.676 m)   Wt 126 lb 5.2 oz (57.3 kg)   LMP 11/19/1988 (Exact Date)   SpO2 99%   BMI 20.39 kg/m  Filed Weights   07/31/21 0313  Weight: 126 lb 5.2 oz (57.3 kg)    Estimated body mass index is 20.39 kg/m as calculated from the following:   Height as of this encounter: 5\' 6"  (1.676 m).   Weight as of this encounter: 126 lb 5.2 oz (57.3 kg).  LABS: CBC:    Component Value Date/Time   WBC 11.2 (H) 08/02/2021 1141   HGB 8.6 (L) 08/02/2021 1141   HGB 13.8 03/15/2019 1201   HCT 26.6 (L) 08/02/2021 1141   HCT 39.0 03/15/2019 1201   HCT 36 12/18/2016 0000   PLT 610 (H) 08/02/2021 1141   PLT 325 03/15/2019 1201   MCV 93.7 08/02/2021 1141   MCV 96 03/15/2019 1201   MCV 98 12/18/2016 0000   NEUTROABS 9.8 (H) 08/02/2021 1141   NEUTROABS 2.4 05/03/2018 1826   LYMPHSABS 0.6 (L) 08/02/2021 1141   LYMPHSABS 1.6 05/03/2018 1826   MONOABS 0.7 08/02/2021 1141   EOSABS 0.0 08/02/2021 1141   EOSABS 0.2 05/03/2018 1826   BASOSABS 0.0 08/02/2021 1141   BASOSABS 0.1 05/03/2018 1826   BASOSABS 2 12/18/2016 0000   Comprehensive Metabolic Panel:    Component Value Date/Time   NA 141 08/01/2021 0749   NA 135 03/15/2019 1201   K 3.9 08/01/2021 0749   K 4.2 06/26/2015 0000   CL 105 08/01/2021 0749   CL 91 05/25/2016 0000   CO2 21 (L) 08/01/2021 0749  CO2 25 08/19/2016 0000   BUN 18 08/01/2021 0749   BUN 12 03/15/2019 1201   BUN 11 12/18/2016 0000   CREATININE 0.33 (L) 08/01/2021 0749   CREATININE 0.67 12/18/2016 0000   GLUCOSE 95 08/01/2021 0749   CALCIUM 7.9 (L) 08/01/2021 0749   CALCIUM 8.9 12/18/2016 0000   AST 21 07/29/2021 2030   AST 15 12/18/2016 0000   ALT 21 07/29/2021 2030   ALT 11 08/19/2016 0000   ALKPHOS 216 (H) 07/29/2021 2030   ALKPHOS 117 12/18/2016 0000   BILITOT 0.5 07/29/2021 2030   BILITOT <0.2 03/15/2019 1201   BILITOT 0.3 12/18/2016 0000    PROT 7.4 07/29/2021 2030   PROT 6.8 03/15/2019 1201   ALBUMIN 2.6 (L) 07/29/2021 2030   ALBUMIN 4.5 03/15/2019 1201   ALBUMIN 4.5 12/18/2016 0000    RADIOGRAPHIC STUDIES: CT CHEST ABDOMEN PELVIS W CONTRAST  Result Date: 07/30/2021 CLINICAL DATA:  65 year old female with history of multilobar pneumonia on chest x-ray and abdominal pain. 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 Possible sepsis. EXAM: CT CHEST, ABDOMEN, AND PELVIS WITH CONTRAST TECHNIQUE: Multidetector CT imaging of the chest, abdomen and pelvis was performed following the standard protocol during bolus administration of intravenous contrast. CONTRAST:  74mL OMNIPAQUE IOHEXOL 350 MG/ML SOLN COMPARISON:  PET-CT 07/08/2021. FINDINGS: CT CHEST FINDINGS Cardiovascular: Heart size is normal. There is no significant pericardial fluid, thickening or pericardial calcification. Aortic atherosclerosis. No definite coronary artery calcifications. Left internal jugular single-lumen porta cath with tip terminating in the distal superior vena cava. Mediastinum/Nodes: Multiple prominent but nonenlarged mediastinal lymph nodes are nonspecific. No definite pathologic lymphadenopathy identified in mediastinal or hilar nodal stations. Esophagus is unremarkable in appearance. No axillary lymphadenopathy. Lungs/Pleura: Worsening patchy multifocal areas of ground-glass attenuation, septal thickening and thickening of the peribronchovascular interstitium is seen throughout the lungs bilaterally with no discernible craniocaudal gradient. No confluent consolidative airspace disease. No pleural effusions. No definite suspicious appearing pulmonary nodules or masses are noted. Musculoskeletal: There are no aggressive appearing lytic or blastic lesions noted in the visualized portions of the skeleton. CT ABDOMEN PELVIS FINDINGS Hepatobiliary: No suspicious cystic or solid hepatic lesions. No intra or extrahepatic biliary ductal dilatation. Status post  cholecystectomy. Pancreas: No pancreatic mass. No pancreatic ductal dilatation. No pancreatic or peripancreatic fluid collections or inflammatory changes. Spleen: Unremarkable. Adrenals/Urinary Tract: Bilateral kidneys and adrenal glands are normal in appearance. No hydroureteronephrosis. Urinary bladder is normal in appearance. Stomach/Bowel: The appearance of the stomach is normal. There is no pathologic dilatation of small bowel or colon. Normal appendix. Vascular/Lymphatic: Aortic atherosclerosis, without evidence of aneurysm or dissection in the abdominal or pelvic vasculature. No lymphadenopathy noted in the abdomen or pelvis. Reproductive: Status post hysterectomy. Ovaries are not confidently identified may be surgically absent or atrophic. Other: No significant volume of ascites.  No pneumoperitoneum. Musculoskeletal: Chronic compression fracture of anterior aspect of the superior endplate of L1, similar to the prior study from 05/01/2021 with minimal loss of anterior vertebral body height. There are no aggressive appearing lytic or blastic lesions noted in the visualized portions of the skeleton. IMPRESSION: 1. Marked worsening multifocal ground-glass attenuation and septal thickening in the lungs bilaterally when compared to prior PET-CT 07/08/2021. Findings are favored to reflect a drug-induced pneumonitis, but could alternatively reflect multilobar bilateral pneumonia from atypical infection. Further clinical evaluation is recommended. 2. No acute findings are noted in the abdomen or pelvis. 3. No definite signs of metastatic disease in the chest, abdomen or pelvis. 4. Aortic atherosclerosis. 5.  Additional incidental findings, as above. Electronically Signed   By: Vinnie Langton M.D.   On: 07/30/2021 06:53   NM PET Image Restag (PS) Skull Base To Thigh  Result Date: 07/09/2021 CLINICAL DATA:  Subsequent treatment strategy for head/neck cancer. EXAM: NUCLEAR MEDICINE PET SKULL BASE TO THIGH  TECHNIQUE: 6.1 mCi F-18 FDG was injected intravenously. Full-ring PET imaging was performed from the skull base to thigh after the radiotracer. CT data was obtained and used for attenuation correction and anatomic localization. Fasting blood glucose: 91 mg/dl COMPARISON:  CT abdomen 05/01/2021 and PET 01/09/2021. CT chest 09/26/2020. 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 No hypermetabolic lymph nodes. Incidental CT findings: Associated osseous destruction surrounding the right maxillary sinus. CHEST: No hypermetabolic mediastinal, hilar or axillary lymph nodes. No hypermetabolic pulmonary nodules. Incidental CT findings: Left IJ Port-A-Cath terminates in the SVC. Heart is at the upper limits of normal in size. No pericardial or pleural effusion. Basilar predominant peribronchovascular ground-glass and mild septal thickening. ABDOMEN/PELVIS: No abnormal hypermetabolism in the liver, adrenal glands, spleen or pancreas. No hypermetabolic lymph nodes. Incidental CT findings: Liver is unremarkable. Cholecystectomy. Adrenal glands, kidneys, spleen, pancreas, stomach and bowel are grossly unremarkable. Atherosclerotic calcification of the aorta. SKELETON: No abnormal osseous hypermetabolism. Very mild hypermetabolism associated with a fracture of the superior endplate of L1 is new from 01/09/2021. Incidental CT findings: Degenerative changes in the spine. IMPRESSION: 1. Continued response to therapy as evidenced by decreased wall thickening and hypermetabolism associated with a cavitary right maxillary/nasopharyngeal mass. No evidence of distant metastatic disease. 2. Subacute compression of the L1 superior endplate, new from 01/09/2021. 3.  Aortic atherosclerosis (ICD10-I70.0).  Electronically Signed   By: Lorin Picket M.D.   On: 07/09/2021 13:28   DG Chest Port 1 View  Result Date: 08/03/2021 CLINICAL DATA:  Pneumonitis.  History of cancer. EXAM: PORTABLE CHEST 1 VIEW COMPARISON:  July 29, 2021 FINDINGS: Diffuse bilateral patchy pulmonary infiltrates. Stable left Port-A-Cath. No pneumothorax. No change in the cardiomediastinal silhouette. IMPRESSION: New diffuse bilateral pulmonary patchy infiltrates. The findings are most consistent with multifocal pneumonia. Edema considered less likely. Electronically Signed   By: Dorise Bullion III M.D.   On: 08/03/2021 08:16   DG Chest Port 1 View  Result Date: 07/29/2021 CLINICAL DATA:  65 year old female with history of cough and weakness for the past several days. EXAM: PORTABLE CHEST 1 VIEW COMPARISON:  Chest x-ray 12/30/2020. FINDINGS: Left-sided internal jugular single-lumen porta cath with tip terminating in the distal superior vena cava. Lung volumes are normal. Diffuse interstitial prominence and peribronchial cuffing. No confluent consolidative airspace disease. No pleural effusions. No pneumothorax. No pulmonary nodule or mass noted. Pulmonary vasculature and the cardiomediastinal silhouette are within normal limits. Atherosclerotic calcifications in the thoracic aorta. IMPRESSION: 1. The appearance the chest suggests severe bronchitis, potentially with developing multilobar bronchopneumonia. 2. Aortic atherosclerosis. Electronically Signed   By: Vinnie Langton M.D.   On: 07/29/2021 19:39    PERFORMANCE STATUS (ECOG) : 3 - Symptomatic, >50% confined to bed  Review of Systems Unless otherwise noted, a complete review of systems is negative.  Physical Exam General: NAD Pulmonary: Unlabored, on O2 Extremities: no edema, no joint deformities Skin: no rashes Neurological: Weakness, dysarthria  IMPRESSION: Patient well-known to me from the clinic.  Today, she  reports that she is doing about the same.  She remains  on O2 but denies shortness of breath at present.  She says her pain is stable.  Patient seems to recognize that her current respiratory failure could be secondary to immunotherapy related pneumonitis and this may complicate future treatment options.  She remains in agreement with the current scope of treatment and is hopeful to get better and then follow-up in the [place] to further discuss options.   Patient has refused home resources.  She describes herself as a private individual and is not interested in someone coming to the home.  She feels like she can be adequately supported by her roommate.  Discussed CODE STATUS.  Patient wants to remain a full code for now.  However, she would be willing to reconsider in the event of futile care.  She says that she has had conversations with her roommate about her wishes.  She would want her roommate to serve as her healthcare proxy if needed.  Note the patient has a son and daughter but is not actively involved with them.  She says that she cannot have communication with them based on the terms of her current parole status.  Encourage patient to consider completing healthcare power of attorney.  We will consult the chaplain to assist with this.  PLAN: -Continue current scope of treatment -Chaplain consult to assist with ACP -Will follow patient in the clinic   Time Total: 60 minutes  Visit consisted of counseling and education dealing with the complex and emotionally intense issues of symptom management and palliative care in the setting of serious and potentially life-threatening illness.Greater than 50%  of this time was spent counseling and coordinating care related to the above assessment and plan.  Signed by: Altha Harm, PhD, NP-C

## 2021-08-05 ENCOUNTER — Encounter: Payer: Self-pay | Admitting: Hospice and Palliative Medicine

## 2021-08-05 DIAGNOSIS — J9601 Acute respiratory failure with hypoxia: Secondary | ICD-10-CM | POA: Diagnosis not present

## 2021-08-05 LAB — CBC
HCT: 29.1 % — ABNORMAL LOW (ref 36.0–46.0)
Hemoglobin: 9.4 g/dL — ABNORMAL LOW (ref 12.0–15.0)
MCH: 29.9 pg (ref 26.0–34.0)
MCHC: 32.3 g/dL (ref 30.0–36.0)
MCV: 92.7 fL (ref 80.0–100.0)
Platelets: 579 10*3/uL — ABNORMAL HIGH (ref 150–400)
RBC: 3.14 MIL/uL — ABNORMAL LOW (ref 3.87–5.11)
RDW: 16.8 % — ABNORMAL HIGH (ref 11.5–15.5)
WBC: 8.5 10*3/uL (ref 4.0–10.5)
nRBC: 0 % (ref 0.0–0.2)

## 2021-08-05 LAB — BASIC METABOLIC PANEL
Anion gap: 8 (ref 5–15)
BUN: 17 mg/dL (ref 8–23)
CO2: 32 mmol/L (ref 22–32)
Calcium: 8.2 mg/dL — ABNORMAL LOW (ref 8.9–10.3)
Chloride: 95 mmol/L — ABNORMAL LOW (ref 98–111)
Creatinine, Ser: <0.3 mg/dL — ABNORMAL LOW (ref 0.44–1.00)
Glucose, Bld: 111 mg/dL — ABNORMAL HIGH (ref 70–99)
Potassium: 4.1 mmol/L (ref 3.5–5.1)
Sodium: 135 mmol/L (ref 135–145)

## 2021-08-05 LAB — FUNGITELL, SERUM: Fungitell Result: <31 pg/mL (ref <80)

## 2021-08-05 MED ORDER — FUROSEMIDE 10 MG/ML IJ SOLN
40.0000 mg | Freq: Once | INTRAMUSCULAR | Status: AC
  Administered 2021-08-05: 40 mg via INTRAVENOUS
  Filled 2021-08-05: qty 4

## 2021-08-05 MED ORDER — FUROSEMIDE 10 MG/ML IJ SOLN
40.0000 mg | Freq: Every day | INTRAMUSCULAR | Status: DC
  Administered 2021-08-05: 40 mg via INTRAVENOUS

## 2021-08-05 MED ORDER — ONDANSETRON HCL 4 MG/2ML IJ SOLN
4.0000 mg | Freq: Four times a day (QID) | INTRAMUSCULAR | Status: DC | PRN
  Administered 2021-08-05: 4 mg via INTRAVENOUS
  Filled 2021-08-05: qty 2

## 2021-08-05 MED ORDER — METHYLPREDNISOLONE SODIUM SUCC 40 MG IJ SOLR
40.0000 mg | INTRAMUSCULAR | Status: DC
  Administered 2021-08-06 – 2021-08-07 (×2): 40 mg via INTRAVENOUS
  Filled 2021-08-05 (×2): qty 1

## 2021-08-05 MED ORDER — METOPROLOL TARTRATE 25 MG PO TABS
12.5000 mg | ORAL_TABLET | Freq: Two times a day (BID) | ORAL | Status: DC
  Administered 2021-08-05 – 2021-08-08 (×5): 12.5 mg via ORAL
  Filled 2021-08-05 (×6): qty 1

## 2021-08-05 NOTE — Progress Notes (Signed)
PROGRESS NOTE    LAMAYA HYNEMAN   ZJI:[id number]  DOB: 1956-08-22  DOA: 07/30/2021 PCP: Merryl Hacker, No   Brief Narrative:  [name] a 65 y.o. 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  She was diagnosed with head and neck cancer in 2021 and is currently receiving Keytruda and Cetuximab.   She presents to the hospital for complaints of nausea, vomiting and dry heaves which started after her last dose of cancer treatment on 07/23/2021.  She admits to shortness of breath with exertion. Chest x-ray reveals diffuse interstitial prominence and peribronchial cuffing. CT chest abdomen pelvis with contrast reveals: Multifocal groundglass attenuation and septal thickening in the lungs bilaterally which are worse when compared to PET/CT on 07/08/2021 - No definite signs of metastatic disease in the chest abdomen pelvis  In the ED, she was started on antibiotics: Vancomycin, azithromycin and cefepime   Subjective: She has no new complaints today.    Assessment & Plan:   Principal Problem:   Acute respiratory failure with hypoxemia (HCC) and hemoptysis  -Currently receiving ceftriaxone and azithromycin -May be related to aspiration however she is on 3 medications that can cause pneumonitis including Keytruda, Cetuximab and Amiodarone -It is to be noted that the patient has not had any fevers, sweats or chills and does not have any leukocytosis - she was given Ceftriaxone and Azithromycin x 5 days-   - IV steroids started to treat for drug induced pneumonitis  - s/p MBS, she has been started on liquid diet (she takes pureed and pudding thick liquids at baseline) - Interveral worsening of infiltrated on CXR on 10/2 - she required 4 L O2 when getting out of  bed on 10/3 - given 1 time dose of 20 mg of Furosemide - being given 40 mg IV Lasix today - follow for improvement in ambulatory pulse ox    Problems: Anemia, normocytic - acute drop in Hgb noted from 10.6 to 8.7 on 9/29- Hgb has been stable since - patient had hemoptysis and was receiving IVF- partly dilutional and partly due to hemoptysis  Thrombocytosis, chronic - cont to follow as outpt  COPD / nicotine abuse - Continue nebulizer treatments  Nausea and vomiting - Status post treatment for cancer - has resolved  Hypokalemia - has been replaced  Right sided Maxillary cancer, squamous cell invasive cancer Generalized weakness with weight loss -She initially presented with a large right facial mass eroding into the maxillary area and multiple branches of the maxillary division of the right trigeminal nerve - Status post multiple cycles of chemotherapy-developed hypersensitivity to carboplatin in 5/22 -Receiving treatment by Dr. 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   Difficult to place patient No      Consultants:  Oncology Palliative  care Procedures:  none Antimicrobials:  Anti-infectives (From admission, onward)    Start     Dose/Rate Route Frequency Ordered Stop   07/30/21 1800  cefTRIAXone (ROCEPHIN) 2 g in sodium chloride 0.9 % 100 mL IVPB        2 g 200 mL/hr over 30 Minutes Intravenous Daily-1800 07/30/21 1126 08/03/21 1839   07/30/21 1145  azithromycin (ZITHROMAX) 500 mg in sodium chloride 0.9 % 250 mL IVPB        500 mg 250 mL/hr over 60 Minutes Intravenous Daily 07/30/21 1126 08/03/21 1205   07/30/21 0600  vancomycin (VANCOREADY) IVPB 1250 mg/250 mL        1,250 mg 166.7 mL/hr over 90 Minutes Intravenous  Once 07/30/21 0557 07/30/21 1019   07/30/21 0545  ceFEPIme (MAXIPIME) 1 g in sodium chloride 0.9 % 100 mL IVPB        1 g 200 mL/hr over 30 Minutes Intravenous  Once 07/30/21 0540 07/30/21 0753        Objective: Vitals:   08/04/21 2006 08/05/21 0504 08/05/21 0732 08/05/21 1114  BP: 132/70 116/66 110/72 110/62  Pulse: 75 67 (!) 59 68  Resp: 17 16 16 16   Temp: 98.3 F (36.8 C) 98 F (36.7 C) 98.2 F (36.8 C) 98 F (36.7 C)  TempSrc:      SpO2: 99% 98% 94% 96%  Weight:      Height:        Intake/Output Summary (Last 24 hours) at 08/05/2021 1405 Last data filed at 08/05/2021 0811 Gross per 24 hour  Intake 720 ml  Output 1600 ml  Net -880 ml    Filed Weights   07/31/21 0313  Weight: 57.3 kg    Examination: General exam: Appears comfortable  HEENT: PERRLA, oral mucosa moist, no sclera icterus or thrush Respiratory system: crackles at bases.  Respiratory effort normal. Cardiovascular system: S1 & S2 heard, regular rate and rhythm Gastrointestinal system: Abdomen soft, non-tender, nondistended. Normal bowel sounds   Central nervous system: Alert and oriented. No focal neurological deficits. Extremities: No cyanosis, clubbing or edema Skin: No rashes or ulcers Psychiatry:  Mood & affect appropriate.      Data Reviewed: I have personally reviewed following labs and imaging  studies  CBC: Recent Labs  Lab 07/29/21 2030 07/31/21 0600 08/02/21 1141 08/05/21 0549  WBC 10.2 7.9 11.2* 8.5  NEUTROABS  --   --  9.8*  --   HGB 10.6* 8.7* 8.6* 9.4*  HCT 32.0* 27.5* 26.6* 29.1*  MCV 91.7 92.3 93.7 92.7  PLT 512* 494* 610* 579*    Basic Metabolic Panel: Recent Labs  Lab 07/29/21 2030 07/30/21 0551 07/31/21 0600 08/01/21 0749 08/05/21 0549  NA 134*  --  137 141 135  K 3.2*  --  3.0* 3.9 4.1  CL 93*  --  99 105 95*  CO2 27  --  26 21* 32  GLUCOSE 103*  --  77 95 111*  BUN 11  --  11 18 17   CREATININE 0.46  --  0.40* 0.33* <0.30*  CALCIUM 8.5*  --  8.0* 7.9* 8.2*  MG  --  1.8  --   --   --     GFR: CrCl cannot be calculated (This lab value cannot be used to calculate CrCl because it is not a number: <0.30). Liver Function Tests: Recent Labs  Lab 07/29/21 2030  AST 21  ALT 21  ALKPHOS 216*  BILITOT 0.5  PROT 7.4  ALBUMIN 2.6*    No results for input(s): LIPASE, AMYLASE in the last 168 hours. No results for input(s): AMMONIA in the last 168 hours. Coagulation Profile: No results for input(s): INR, PROTIME in the last 168 hours. Cardiac Enzymes: No results for input(s): CKTOTAL, CKMB, CKMBINDEX, TROPONINI in the last 168 hours. BNP (last 3 results) No results for input(s): PROBNP in the last 8760 hours. HbA1C: No results for input(s): HGBA1C in the last 72 hours. CBG: No results for input(s): GLUCAP in the last 168 hours. Lipid Profile: No results for input(s): CHOL, HDL, LDLCALC, TRIG, CHOLHDL, LDLDIRECT in the last 72 hours. Thyroid Function Tests: No results for input(s): TSH, T4TOTAL, FREET4, T3FREE, THYROIDAB in the last 72 hours. Anemia Panel: Recent Labs    08/03/21 1308  FERRITIN 72    Urine analysis:    Component Value Date/Time   COLORURINE AMBER (A) 07/30/2021 0057   APPEARANCEUR CLOUDY (A) 07/30/2021 0057   APPEARANCEUR Clear 12/21/2018 1041   LABSPEC 1.032 (H) 07/30/2021 0057   LABSPEC 1.017 04/23/2015 0000    PHURINE 6.0 07/30/2021 0057   GLUCOSEU NEGATIVE 07/30/2021 0057   HGBUR NEGATIVE 07/30/2021 0057   BILIRUBINUR SMALL (A) 07/30/2021 0057   BILIRUBINUR Negative 12/21/2018 1041   KETONESUR 20 (A) 07/30/2021 0057   PROTEINUR 100 (A) 07/30/2021 0057   NITRITE NEGATIVE 07/30/2021 0057   LEUKOCYTESUR SMALL (A) 07/30/2021 0057   Sepsis Labs: @LABRCNTIP (procalcitonin:4,lacticidven:4) ) Recent Results (from the past 240 hour(s))  Blood Culture (routine x 2)     Status: None   Collection Time: 07/30/21  5:51 AM   Specimen: BLOOD  Result Value Ref Range Status   Specimen Description BLOOD LEFT HAND  Final   Special Requests   Final    BOTTLES DRAWN AEROBIC AND ANAEROBIC Blood Culture adequate volume   Culture   Final    NO GROWTH 5 DAYS Performed at [facility], [address] [place], [place] [address]    Report Status 08/04/2021 FINAL  Final  Blood Culture (routine x 2)     Status: None   Collection Time: 07/30/21  5:51 AM   Specimen: BLOOD  Result Value Ref Range Status   Specimen Description BLOOD RIGHT FOREARM  Final   Special Requests   Final    BOTTLES DRAWN AEROBIC AND ANAEROBIC Blood Culture adequate volume   Culture   Final    NO GROWTH 5 DAYS Performed at [facility], [address], [place], [place] [address]    Report Status 08/04/2021 FINAL  Final  Urine Culture     Status: Abnormal   Collection Time: 07/30/21  5:51 AM   Specimen: Urine, Random  Result Value Ref Range Status   Specimen Description   Final    URINE, RANDOM Performed at [facility], [address], [place], [place] [address]    Special Requests   Final    NONE Performed at [facility], [address] [place], [place] [address]    Culture (A)  Final    <10,000 COLONIES/mL INSIGNIFICANT GROWTH Performed at [facility], [place] [address] [place], [place] [address]  Report Status 07/31/2021 FINAL  Final  Resp Panel by RT-PCR (Flu A&B, Covid)  Nasopharyngeal Swab     Status: None   Collection Time: 07/30/21  5:51 AM   Specimen: Nasopharyngeal Swab; Nasopharyngeal(NP) swabs in vial transport medium  Result Value Ref Range Status   SARS Coronavirus 2 by RT PCR NEGATIVE NEGATIVE Final    Comment: (NOTE) SARS-CoV-2 target nucleic acids are NOT DETECTED.  The SARS-CoV-2 RNA is generally detectable in upper respiratory specimens during the acute phase of infection. The lowest concentration of SARS-CoV-2 viral copies this assay can detect is 138 copies/mL. A negative result does not preclude SARS-Cov-2 infection and should not be used as the sole basis for treatment or other patient management decisions. A negative result may occur with  improper specimen collection/handling, submission of specimen other than nasopharyngeal swab, presence of viral mutation(s) within the areas targeted by this assay, and inadequate number of viral copies(<138 copies/mL). A negative result must be combined with clinical observations, patient history, and epidemiological information. The expected result is Negative.  Fact Sheet for Patients:  EntrepreneurPulse.com.au  Fact Sheet for Healthcare Providers:  IncredibleEmployment.be  This test is no t yet approved or cleared by the [place] FDA and  has been authorized for detection and/or diagnosis of SARS-CoV-2 by FDA under an Emergency Use Authorization (EUA). This EUA will remain  in effect (meaning this test can be used) for the duration of the COVID-19 declaration under Section 564(b)(1) of the Act, 21 U.S.C.section 360bbb-3(b)(1), unless the authorization is terminated  or revoked sooner.       Influenza A by PCR NEGATIVE NEGATIVE Final   Influenza B by PCR NEGATIVE NEGATIVE Final    Comment: (NOTE) The Xpert Xpress SARS-CoV-2/FLU/RSV plus assay is intended as an aid in the diagnosis of influenza from Nasopharyngeal swab specimens and should not be  used as a sole basis for treatment. Nasal washings and aspirates are unacceptable for Xpert Xpress SARS-CoV-2/FLU/RSV testing.  Fact Sheet for Patients: EntrepreneurPulse.com.au  Fact Sheet for Healthcare Providers: IncredibleEmployment.be  This test is not yet approved or cleared by the [place] FDA and has been authorized for detection and/or diagnosis of SARS-CoV-2 by FDA under an Emergency Use Authorization (EUA). This EUA will remain in effect (meaning this test can be used) for the duration of the COVID-19 declaration under Section 564(b)(1) of the Act, 21 U.S.C. section 360bbb-3(b)(1), unless the authorization is terminated or revoked.  Performed at [facility], [address], [place], [place] [address]          Radiology Studies: No results found.    Scheduled Meds:  apixaban  5 mg Oral BID   Chlorhexidine Gluconate Cloth  6 each Topical Daily   docusate sodium  100 mg Oral Daily   fentaNYL  1 patch Transdermal Q72H   gabapentin  600 mg Oral TID   influenza vaccine adjuvanted  0.5 mL Intramuscular Tomorrow-1000   [START ON 08/06/2021] methylPREDNISolone (SOLU-MEDROL) injection  40 mg Intravenous Q24H   metoprolol tartrate  12.5 mg Oral BID   mometasone-formoterol  2 puff Inhalation BID   multivitamin with minerals  1 tablet Oral Daily   oxcarbazepine  600 mg Oral BID   sodium chloride flush  10-40 mL Intracatheter Q12H   Continuous Infusions:     LOS: 6 days      Debbe Odea, MD [facility] Pager: [email or web address] 08/05/2021, 2:05 PM

## 2021-08-05 NOTE — Progress Notes (Signed)
Occupational Therapy Treatment Patient Details Name: Shannon Schroeder MRN: [id number] DOB: Jan 25, 1956 Today's Date: 08/05/2021   History of present illness Pt is a 65 y/o F admitted on 07/30/21 after presenting with c/o worsening malaise. CT chest done showing multifocal GGO centrally predominant with generous PA diameter. Pt is being treated for Bilateral multifocal pneumonia. PMH: AF on chronic anticoagulation therapy, hx of head & neck CA on chemo & immunotherapy, HTN, aneurysm of anterior cerebral artery, anxiety, arthritis, bipolar, COPD, seizures   OT comments  Pt seen for OT tx this date to f/u re: safety with ADLs/ADL mobility. Pt reports being on Lasix today and consistent voiding. OT offers brief for session and pt agreeable. OT engages pt in LB dressing tasks with CGA/MIN A bed level/bridging; and fxl mobility with SUPV and RW in the room. She does de-sat to 82% on 2Lnc and is titrated to 3L, only able to recover to 84% after 1 in standing rest, requires titrating ultimately to 6Lnc for fxl mobiltiy back to bed and requires ~2-3 mins seated rest to gradually increased O2 sats back above 90%. Turned back to 2Lnc at rest and satting 93% and above. Will continue to follow acutely. Still anticipate pt will be safe to return home with [place], but recognize that this could be impacted by O2 needs.    Recommendations for follow up therapy are one component of a multi-disciplinary discharge planning process, led by the attending physician.  Recommendations may be updated based on patient status, additional functional criteria and insurance authorization.    Follow Up Recommendations  Home health OT    Equipment Recommendations  3 in 1 bedside commode    Recommendations for Other Services      Precautions / Restrictions Precautions Precautions: Fall Restrictions Weight Bearing Restrictions: No       Mobility Bed Mobility Overal bed mobility: Modified Independent                   Transfers Overall transfer level: Needs assistance Equipment used: Rolling walker (2 wheeled) Transfers: Sit to/from Stand Sit to Stand: Supervision         General transfer comment: SUP for safety    Balance Overall balance assessment: Needs assistance Sitting-balance support: Feet supported;Bilateral upper extremity supported Sitting balance-Leahy Scale: Good     Standing balance support: During functional activity;No upper extremity supported Standing balance-Leahy Scale: Fair                             ADL either performed or assessed with clinical judgement   ADL Overall ADL's : Needs assistance/impaired                     Lower Body Dressing: Min guard;Minimal assistance Lower Body Dressing Details (indicate cue type and reason): bed level to bridge to doff mesh underwear and don brief in prep for fxl mobility d/t             Functional mobility during ADLs: Supervision/safety;Rolling walker (O2 tank, initially on 2Lnc, titrated to 3, then 4, then 6L to sustain sats >88% with small amount of fxl mobility in room)       Vision Baseline Vision/History: 1 Wears glasses Ability to See in Adequate Light: 0 Adequate Patient Visual Report: No change from baseline     Perception     Praxis      Cognition Arousal/Alertness: Awake/alert Behavior During Therapy: [facility] for tasks  assessed/performed Overall Cognitive Status: Within Functional Limits for tasks assessed                                          Exercises Other Exercises Other Exercises: OT engages pt in LB dressing tasks with CGA and fxl mobility with SUPV and RW in the room. She does de-sat to 82% on 2Lnc and is titrated to 3L, only able to recover to 84% after 1 in standing rest, requires titrating ultimately to 6Lnc for fxl mobiltiy back to bed and requires ~2-3 mins seated rest to gradually increased O2 sats back above 90%. Turned back to 2Lnc at rest and  satting 93% and above.   Shoulder Instructions       General Comments      Pertinent Vitals/ Pain       Pain Assessment: No/denies pain  Home Living                                          Prior Functioning/Environment              Frequency  Min 1X/week        Progress Toward Goals  OT Goals(current goals can now be found in the care plan section)  Progress towards OT goals: Progressing toward goals  Acute Rehab OT Goals Patient Stated Goal: go home OT Goal Formulation: With patient Time For Goal Achievement: 08/16/21 Potential to Achieve Goals: Good  Plan Discharge plan remains appropriate    Co-evaluation                 AM-PAC OT "6 Clicks" Daily Activity     Outcome Measure   Help from another person eating meals?: None Help from another person taking care of personal grooming?: A Little Help from another person toileting, which includes using toliet, bedpan, or urinal?: A Little Help from another person bathing (including washing, rinsing, drying)?: A Little Help from another person to put on and taking off regular upper body clothing?: None Help from another person to put on and taking off regular lower body clothing?: A Little 6 Click Score: 20    End of Session Equipment Utilized During Treatment: Gait belt;Rolling walker;Oxygen  OT Visit Diagnosis: Unsteadiness on feet (R26.81);Muscle weakness (generalized) (M62.81)   Activity Tolerance Patient tolerated treatment well   Patient Left in bed;with call bell/phone within reach;with family/visitor present   Nurse Communication Mobility status        Time: 6962-9528 OT Time Calculation (min): 35 min  Charges: OT General Charges $OT Visit: 1 Visit OT Treatments $Self Care/Home Management : 8-22 mins $Therapeutic Activity: 8-22 mins  Gerrianne Scale, [place], OTR/L ascom [phone] 08/05/21, 4:32 PM

## 2021-08-05 NOTE — Progress Notes (Signed)
Pulmonary Medicine          Date: 08/05/2021,   MRN# [id number] Shannon Schroeder Jun 08, 1956     AdmissionWeight: 57.3 kg                 CurrentWeight: 57.3 kg   Referring physician: Dr Raul Del    CHIEF COMPLAINT:   Multifocal ground glass infiltrates of bilateral lungs   HISTORY OF PRESENT ILLNESS    This is a 64 y.o. female with medical history significant for AF on chronic anticoagulation therapy, history of head and neck cancer on chemotherapy and immunotherapy, history of hypertension who presents to the emergency room for evaluation of worsening malaise. Currently on chemo per med-onc Dr Tasia Catchings.  Reports NV symptoms post chemo.  She has chronic dysphagia.  Reports over last week worsening dyspnea on exertion . Initial labs were unremarkable except for chronic anemia.  RVP was negative.  CT chest done showing multifocal GGO centrally predominant with generous PA diameter. PCCM consult for further evaluation and treatment.   08/02/21- patient improved clinically.  She reports less dyspnea. She is developing crackles at bases bilaterally, will dc IVF NS 172ml/h patient is euvolemic.  Reducing steroids to 40 bid solumedrol IV.  PT/OT as ordered. CBC w diff repeat today due to hemoptysis with reduced h/h.    08/03/21-  patient is improved, she is participating with PT/OT now doing well with slight and transient desaturation and dyspnea to [facility] level. She had CXR with persistent airspace opacification bilaterally.  Radiography generally lags behind clinical improvement and I suspect this will get better over next 8 wks. Will test for other less likely possibilites via serology today including fungal/PCP.  CBC with diff consistent with anemia acutely with reactive thrombocytosis.  Appreciate collaboration with everyone involved.   08/04/21- patient was unable to ambulate due to severe dyspnea.  Inflammatory biomarkers are elevated and plan is to continue steroids at current dose  40 bid solumedrol.  I have ordered 1x lasix 20 due to crackles at bases posteriorly.  Her spO2 requirement has not worsened but has not improved either in past 24h.     08/05/21- patient improved finally, she is breathing less labored has less crackles at bases.  Diuresed >2L yesterday. Will order one more dose of lasix today. She is unable to perform IS due to altered anatomy of palate and upper airway. Reducing steroids today to once daily from BID.  Repeat CXR in am. Continue PT/OT.      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    Anxiety    Arthritis    Bipolar disorder (HCC)    COPD (chronic obstructive pulmonary disease) (HCC)    NO INHALERS   Depression    Dysrhythmia    H/O V TACH   Head and neck cancer ([place]) 07/29/2020   Headache    H/O MIGRAINES   Hypertension    Seizures ([place])    X1 AFTER FALL     SURGICAL HISTORY   Past Surgical History:  Procedure Laterality Date   ABDOMINAL HYSTERECTOMY     partial   BREAST SURGERY     biopsy   CARDIAC CATHETERIZATION     X 2   CARPAL TUNNEL RELEASE Right    CARPECTOMY HAND Right    CATARACT EXTRACTION W/PHACO Left 03/08/2018   Procedure: CATARACT EXTRACTION PHACO AND  INTRAOCULAR LENS PLACEMENT (IOC);  Surgeon: Birder Robson, MD;  Location: [facility] ORS;  Service: Ophthalmology;  Laterality: Left;  [place] 00:55 AP% 13.5 CDE 7.52 Fluid pack lot # [id number]   CATARACT EXTRACTION W/PHACO Right 04/05/2018   Procedure: CATARACT EXTRACTION PHACO AND INTRAOCULAR LENS PLACEMENT (IOC);  Surgeon: Birder Robson, MD;  Location: [facility] ORS;  Service: Ophthalmology;  Laterality: Right;  [place] 00:36 AP% 15.9 CDE 5.72 Fluid pack lot # [id number]   CEREBRAL ANEURYSM REPAIR     CHOLECYSTECTOMY     COLONOSCOPY WITH PROPOFOL N/A 04/26/2019   Procedure: COLONOSCOPY WITH PROPOFOL;  Surgeon: Jonathon Bellows, MD;  Location[facility] ENDOSCOPY;  Service:  Gastroenterology;  Laterality: N/A;   PORTACATH PLACEMENT Left 08/05/2020   Procedure: INSERTION PORT-A-CATH;  Surgeon: Herbert Pun, MD;  Location: [facility] ORS;  Service: General;  Laterality: Left;   TUBAL LIGATION       FAMILY HISTORY   Family History  Problem Relation Age of Onset   Hypertension Mother    Heart failure Mother    Hypertension Brother    Stroke Brother    Hypertension Son    Emphysema Maternal Aunt    Hypertension Paternal Aunt    Hypertension Paternal Uncle    Hypertension Maternal Grandmother    Hypertension Paternal Grandmother      SOCIAL HISTORY   Social History   Tobacco Use   Smoking status: Every Day    Packs/day: 0.25    Years: 50.00    Pack years: 12.50    Types: Cigarettes   Smokeless tobacco: Never  Vaping Use   Vaping Use: Never used  Substance Use Topics   Alcohol use: No   Drug use: No     MEDICATIONS    Home Medication:    Current Medication:  Current Facility-Administered Medications:    acetaminophen (TYLENOL) suppository 650 mg, 650 mg, Rectal, Q4H PRN, Sharion Settler, NP, 650 mg at 07/31/21 2055   apixaban (ELIQUIS) tablet 5 mg, 5 mg, Oral, BID, Agbata, Tochukwu, MD, 5 mg at 08/05/21 1007   Chlorhexidine Gluconate Cloth 2 % PADS 6 each, 6 each, Topical, Daily, Rizwan, Eunice Blase, MD, 6 each at 08/05/21 0815   docusate sodium (COLACE) capsule 100 mg, 100 mg, Oral, Daily, Agbata, Tochukwu, MD, 100 mg at 08/05/21 0811   fentaNYL (DURAGESIC) 75 MCG/HR 1 patch, 1 patch, Transdermal, Q72H, Agbata, Tochukwu, MD   furosemide (LASIX) injection 40 mg, 40 mg, Intravenous, Daily, Lanney Gins, Mayela Bullard, MD   gabapentin (NEURONTIN) capsule 600 mg, 600 mg, Oral, TID, Agbata, Tochukwu, MD, 600 mg at 08/05/21 0811   influenza vaccine adjuvanted (FLUAD) injection 0.5 mL, 0.5 mL, Intramuscular, Tomorrow-1000, Agbata, Tochukwu, MD   ipratropium-albuterol (DUONEB) 0.5-2.5 (3) MG/3ML nebulizer solution 3 mL, 3 mL, Nebulization, Q6H PRN, Agbata,  Tochukwu, MD   melatonin tablet 10 mg, 10 mg, Oral, QHS PRN, Agbata, Tochukwu, MD   [START ON 08/06/2021] methylPREDNISolone sodium succinate (SOLU-MEDROL) 40 mg/mL injection 40 mg, 40 mg, Intravenous, Q24H, Lasya Vetter, MD   metoprolol tartrate (LOPRESSOR) tablet 12.5 mg, 12.5 mg, Oral, BID, Catalino Plascencia, MD   mometasone-formoterol (DULERA) 100-5 MCG/ACT inhaler 2 puff, 2 puff, Inhalation, BID, Agbata, Tochukwu, MD, 2 puff at 08/05/21 0814   multivitamin with minerals tablet 1 tablet, 1 tablet, Oral, Daily, Agbata, Tochukwu, MD, 1 tablet at 08/05/21 0811   Oxcarbazepine (TRILEPTAL) tablet 600 mg, 600 mg, Oral, BID, Agbata, Tochukwu, MD, 600 mg at 08/05/21 1219   oxyCODONE (Oxy IR/ROXICODONE) immediate release tablet 10-20 mg, 10-20 mg, Oral, Q4H PRN,  Rito Ehrlich A, RPH, 10 mg at 08/04/21 2143   polyethylene glycol (MIRALAX / GLYCOLAX) packet 17 g, 17 g, Oral, Daily PRN, Agbata, Tochukwu, MD   sodium chloride flush (NS) 0.9 % injection 10-40 mL, 10-40 mL, Intracatheter, Q12H, Rizwan, Saima, MD, 10 mL at 08/05/21 0814   sodium chloride flush (NS) 0.9 % injection 10-40 mL, 10-40 mL, Intracatheter, PRN, Debbe Odea, MD   traZODone (DESYREL) tablet 50 mg, 50 mg, Oral, QHS PRN, Rito Ehrlich A, RPH, 50 mg at 08/03/21 2036  Facility-Administered Medications Ordered in Other Encounters:    heparin lock flush 100 UNIT/ML injection, , , ,    heparin lock flush 100 UNIT/ML injection, , , ,    heparin lock flush 100 unit/mL, 500 Units, Intravenous, Once, Earlie Server, MD   heparin lock flush 100 unit/mL, 500 Units, Intracatheter, Once PRN, Earlie Server, MD   sodium chloride flush (NS) 0.9 % injection 10 mL, 10 mL, Intravenous, PRN, Earlie Server, MD, 10 mL at 10/10/20 1040   sodium chloride flush (NS) 0.9 % injection 10 mL, 10 mL, Intravenous, PRN, Earlie Server, MD, 10 mL at 11/13/20 0836   sodium chloride flush (NS) 0.9 % injection 10 mL, 10 mL, Intravenous, PRN, Earlie Server, MD, 10 mL at 12/30/20 6568   sodium  chloride flush (NS) 0.9 % injection 10 mL, 10 mL, Intravenous, PRN, Earlie Server, MD, 10 mL at 01/14/21 1275   sodium chloride flush (NS) 0.9 % injection 10 mL, 10 mL, Intravenous, PRN, Earlie Server, MD, 10 mL at 06/11/21 0815   sodium chloride flush (NS) 0.9 % injection 10 mL, 10 mL, Intravenous, PRN, Earlie Server, MD, 10 mL at 07/09/21 1700    ALLERGIES   Naproxen, Aspirin, Belladonna alkaloids, Fluoxetine, Fluoxetine hcl, Paroxetine hcl, Phenobarbital, and Prozac [fluoxetine hcl]     REVIEW OF SYSTEMS    Review of Systems:  Gen:  Denies  fever, sweats, chills weigh loss  HEENT: Denies blurred vision, double vision, ear pain, eye pain, hearing loss, nose bleeds, sore throat Cardiac:  No dizziness, chest pain or heaviness, chest tightness,edema Resp:   Denies cough or sputum porduction, shortness of breath,wheezing, hemoptysis,  Gi: Denies swallowing difficulty, stomach pain, nausea or vomiting, diarrhea, constipation, bowel incontinence Gu:  Denies bladder incontinence, burning urine Ext:   Denies Joint pain, stiffness or swelling Skin: Denies  skin rash, easy bruising or bleeding or hives Endoc:  Denies polyuria, polydipsia , polyphagia or weight change Psych:   Denies depression, insomnia or hallucinations   Other:  All other systems negative   VS: BP 110/62 (BP Location: Left Arm)   Pulse 68   Temp 98 F (36.7 C)   Resp 16   Ht 5\' 6"  (1.676 m)   Wt 57.3 kg   LMP 11/19/1988 (Exact Date)   SpO2 96%   BMI 20.39 kg/m      PHYSICAL EXAM    GENERAL:NAD, no fevers, chills, no weakness no fatigue HEAD: Normocephalic, atraumatic.  EYES: Pupils equal, round, reactive to light. Extraocular muscles intact. No scleral icterus.  MOUTH: Moist mucosal membrane. Dentition intact. No abscess noted.  EAR, NOSE, THROAT: Clear without exudates. No external lesions.  NECK: Supple. No thyromegaly. No nodules. No JVD.  PULMONARY: bilateral mild rhonchi with decreased air entry and mild  crackles at bases posteriorly  CARDIOVASCULAR: S1 and S2. Regular rate and rhythm. No murmurs, rubs, or gallops. No edema. Pedal pulses 2+ bilaterally.  GASTROINTESTINAL: Soft, nontender, nondistended. No masses. Positive bowel sounds. No  hepatosplenomegaly.  MUSCULOSKELETAL: No swelling, clubbing, or edema. Range of motion full in all extremities.  NEUROLOGIC: Cranial nerves II through XII are intact. No gross focal neurological deficits. Sensation intact. Reflexes intact.  SKIN: No ulceration, lesions, rashes, or cyanosis. Skin warm and dry. Turgor intact.  PSYCHIATRIC: Mood, affect within normal limits. The patient is awake, alert and oriented x 3. Insight, judgment intact.       IMAGING    CT CHEST ABDOMEN PELVIS W CONTRAST  Result Date: 07/30/2021 CLINICAL DATA:  65 year old female with history of multilobar pneumonia on chest x-ray and abdominal pain. 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 Possible sepsis. EXAM: CT CHEST, ABDOMEN, AND PELVIS WITH CONTRAST TECHNIQUE: Multidetector CT imaging of the chest, abdomen and pelvis was performed following the standard protocol during bolus administration of intravenous contrast. CONTRAST:  41mL OMNIPAQUE IOHEXOL 350 MG/ML SOLN COMPARISON:  PET-CT 07/08/2021. FINDINGS: CT CHEST FINDINGS Cardiovascular: Heart size is normal. There is no significant pericardial fluid, thickening or pericardial calcification. Aortic atherosclerosis. No definite coronary artery calcifications. Left internal jugular single-lumen porta cath with tip terminating in the distal superior vena cava. Mediastinum/Nodes: Multiple prominent but nonenlarged mediastinal lymph nodes are nonspecific. No definite pathologic lymphadenopathy identified in mediastinal or hilar nodal stations. Esophagus is unremarkable in appearance. No axillary lymphadenopathy. Lungs/Pleura: Worsening patchy multifocal areas of ground-glass attenuation, septal thickening and thickening of the  peribronchovascular interstitium is seen throughout the lungs bilaterally with no discernible craniocaudal gradient. No confluent consolidative airspace disease. No pleural effusions. No definite suspicious appearing pulmonary nodules or masses are noted. Musculoskeletal: There are no aggressive appearing lytic or blastic lesions noted in the visualized portions of the skeleton. CT ABDOMEN PELVIS FINDINGS Hepatobiliary: No suspicious cystic or solid hepatic lesions. No intra or extrahepatic biliary ductal dilatation. Status post cholecystectomy. Pancreas: No pancreatic mass. No pancreatic ductal dilatation. No pancreatic or peripancreatic fluid collections or inflammatory changes. Spleen: Unremarkable. Adrenals/Urinary Tract: Bilateral kidneys and adrenal glands are normal in appearance. No hydroureteronephrosis. Urinary bladder is normal in appearance. Stomach/Bowel: The appearance of the stomach is normal. There is no pathologic dilatation of small bowel or colon. Normal appendix. Vascular/Lymphatic: Aortic atherosclerosis, without evidence of aneurysm or dissection in the abdominal or pelvic vasculature. No lymphadenopathy noted in the abdomen or pelvis. Reproductive: Status post hysterectomy. Ovaries are not confidently identified may be surgically absent or atrophic. Other: No significant volume of ascites.  No pneumoperitoneum. Musculoskeletal: Chronic compression fracture of anterior aspect of the superior endplate of L1, similar to the prior study from 05/01/2021 with minimal loss of anterior vertebral body height. There are no aggressive appearing lytic or blastic lesions noted in the visualized portions of the skeleton. IMPRESSION: 1. Marked worsening multifocal ground-glass attenuation and septal thickening in the lungs bilaterally when compared to prior PET-CT 07/08/2021. Findings are favored to reflect a drug-induced pneumonitis, but could alternatively reflect multilobar bilateral pneumonia from  atypical infection. Further clinical evaluation is recommended. 2. No acute findings are noted in the abdomen or pelvis. 3. No definite signs of metastatic disease in the chest, abdomen or pelvis. 4. Aortic atherosclerosis. 5. Additional incidental findings, as above. Electronically Signed   By: Vinnie Langton M.D.   On: 07/30/2021 06:53   NM PET Image Restag (PS) Skull Base To Thigh  Result Date: 07/09/2021 CLINICAL DATA:  Subsequent treatment strategy for head/neck cancer. EXAM: NUCLEAR MEDICINE PET SKULL BASE TO THIGH TECHNIQUE: 6.1 mCi F-18 FDG was injected intravenously. Full-ring PET imaging was performed from the skull base to  thigh after the radiotracer. CT data was obtained and used for attenuation correction and anatomic localization. Fasting blood glucose: 91 mg/dl COMPARISON:  CT abdomen 05/01/2021 and PET 01/09/2021. CT chest 09/26/2020. 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 No hypermetabolic lymph nodes. Incidental CT findings: Associated osseous destruction surrounding the right maxillary sinus. CHEST: No hypermetabolic mediastinal, hilar or axillary lymph nodes. No hypermetabolic pulmonary nodules. Incidental CT findings: Left IJ Port-A-Cath terminates in the SVC. Heart is at the upper limits of normal in size. No pericardial or pleural effusion. Basilar predominant peribronchovascular ground-glass and mild septal thickening. ABDOMEN/PELVIS: No abnormal hypermetabolism in the liver, adrenal glands, spleen or pancreas. No hypermetabolic lymph nodes. Incidental CT findings: Liver is unremarkable. Cholecystectomy. Adrenal glands, kidneys, spleen, pancreas, stomach and bowel are grossly unremarkable. Atherosclerotic calcification of the aorta. SKELETON: No  abnormal osseous hypermetabolism. Very mild hypermetabolism associated with a fracture of the superior endplate of L1 is new from 01/09/2021. Incidental CT findings: Degenerative changes in the spine. IMPRESSION: 1. Continued response to therapy as evidenced by decreased wall thickening and hypermetabolism associated with a cavitary right maxillary/nasopharyngeal mass. No evidence of distant metastatic disease. 2. Subacute compression of the L1 superior endplate, new from 01/09/2021. 3.  Aortic atherosclerosis (ICD10-I70.0). Electronically Signed   By: Lorin Picket M.D.   On: 07/09/2021 13:28   DG Chest Port 1 View  Result Date: 08/03/2021 CLINICAL DATA:  Pneumonitis.  History of cancer. EXAM: PORTABLE CHEST 1 VIEW COMPARISON:  July 29, 2021 FINDINGS: Diffuse bilateral patchy pulmonary infiltrates. Stable left Port-A-Cath. No pneumothorax. No change in the cardiomediastinal silhouette. IMPRESSION: New diffuse bilateral pulmonary patchy infiltrates. The findings are most consistent with multifocal pneumonia. Edema considered less likely. Electronically Signed   By: Dorise Bullion III M.D.   On: 08/03/2021 08:16   DG Chest Port 1 View  Result Date: 07/29/2021 CLINICAL DATA:  65 year old female with history of cough and weakness for the past several days. EXAM: PORTABLE CHEST 1 VIEW COMPARISON:  Chest x-ray 12/30/2020. FINDINGS: Left-sided internal jugular single-lumen porta cath with tip terminating in the distal superior vena cava. Lung volumes are normal. Diffuse interstitial prominence and peribronchial cuffing. No confluent consolidative airspace disease. No pleural effusions. No pneumothorax. No pulmonary nodule or mass noted. Pulmonary vasculature and the cardiomediastinal silhouette are within normal limits. Atherosclerotic calcifications in the thoracic aorta. IMPRESSION: 1. The appearance the chest suggests severe bronchitis, potentially with developing multilobar bronchopneumonia. 2. Aortic  atherosclerosis. Electronically Signed   By: Vinnie Langton M.D.   On: 07/29/2021 19:39      ASSESSMENT/PLAN   Bilateral multifocal pneumonia -suspect this is due to [place] however patient reports hemoptysis bright red blood and has significant decrement in hemoglobin.  Possible Alevolar hemorrhage in context of pnemonitis with concomitant anticoagulation.  Although she is on IV fluids remainder of cell lines are not proportionally reduced which suggests blood loss.  I agree with steroids.  She is improved already and should be better in 48 hours with steroids.  Agree with empiric abx.  She has received only 2 doses of Keytruda and I would suggest trying to give this again next month after improvement. She has dysphagia and higher risk for aspiration. Noted s/p SLP. Will reduce solumedrol to 60 bid today.   -continue solumedrol bid  40 mg- CXR interval worse, DCD IV fluids -finish empiric CAP regimen -Continue PT/OT -Lasix 20 x1 today  Per primary team AF-metoprolol COPD-dulera, she uses advair HFA and Ventolin at home Head/Neck CA- per med onc Dr Tasia Catchings   Thank you for allowing me to participate in the care of this patient.   Patient/Family are satisfied with care plan and all questions have been answered.  This document was prepared using Dragon voice recognition software and may include unintentional dictation errors.     Ottie Glazier, M.D.  Division of [place]

## 2021-08-06 ENCOUNTER — Inpatient Hospital Stay: Payer: Medicare Other | Admitting: Oncology

## 2021-08-06 ENCOUNTER — Inpatient Hospital Stay: Payer: Medicare Other | Admitting: Hospice and Palliative Medicine

## 2021-08-06 ENCOUNTER — Inpatient Hospital Stay: Payer: Medicare Other

## 2021-08-06 DIAGNOSIS — C760 Malignant neoplasm of head, face and neck: Secondary | ICD-10-CM | POA: Diagnosis not present

## 2021-08-06 DIAGNOSIS — J9601 Acute respiratory failure with hypoxia: Secondary | ICD-10-CM | POA: Diagnosis not present

## 2021-08-06 DIAGNOSIS — F3160 Bipolar disorder, current episode mixed, unspecified: Secondary | ICD-10-CM | POA: Diagnosis not present

## 2021-08-06 DIAGNOSIS — J189 Pneumonia, unspecified organism: Secondary | ICD-10-CM | POA: Diagnosis not present

## 2021-08-06 DIAGNOSIS — G893 Neoplasm related pain (acute) (chronic): Secondary | ICD-10-CM | POA: Diagnosis not present

## 2021-08-06 LAB — BASIC METABOLIC PANEL
Anion gap: 10 (ref 5–15)
BUN: 29 mg/dL — ABNORMAL HIGH (ref 8–23)
CO2: 32 mmol/L (ref 22–32)
Calcium: 8.6 mg/dL — ABNORMAL LOW (ref 8.9–10.3)
Chloride: 93 mmol/L — ABNORMAL LOW (ref 98–111)
Creatinine, Ser: 0.38 mg/dL — ABNORMAL LOW (ref 0.44–1.00)
GFR, Estimated: >60 mL/min (ref >60)
Glucose, Bld: 133 mg/dL — ABNORMAL HIGH (ref 70–99)
Potassium: 3.4 mmol/L — ABNORMAL LOW (ref 3.5–5.1)
Sodium: 135 mmol/L (ref 135–145)

## 2021-08-06 LAB — CBC
HCT: 32 % — ABNORMAL LOW (ref 36.0–46.0)
Hemoglobin: 10.1 g/dL — ABNORMAL LOW (ref 12.0–15.0)
MCH: 29.3 pg (ref 26.0–34.0)
MCHC: 31.6 g/dL (ref 30.0–36.0)
MCV: 92.8 fL (ref 80.0–100.0)
Platelets: 631 10*3/uL — ABNORMAL HIGH (ref 150–400)
RBC: 3.45 MIL/uL — ABNORMAL LOW (ref 3.87–5.11)
RDW: 16.8 % — ABNORMAL HIGH (ref 11.5–15.5)
WBC: 17.1 10*3/uL — ABNORMAL HIGH (ref 4.0–10.5)
nRBC: 0 % (ref 0.0–0.2)

## 2021-08-06 MED ORDER — POTASSIUM CHLORIDE CRYS ER 20 MEQ PO TBCR
20.0000 meq | EXTENDED_RELEASE_TABLET | Freq: Once | ORAL | Status: AC
  Administered 2021-08-06: 20 meq via ORAL
  Filled 2021-08-06: qty 1

## 2021-08-06 NOTE — Progress Notes (Addendum)
Speech Language Pathology Treatment: Dysphagia  Patient Details Name: Shannon Schroeder MRN: [id number] DOB: 08/25/1956 Today's Date: 08/06/2021 Time: 2297-9892 SLP Time Calculation (min) (ACUTE ONLY): 45 min  Assessment / Plan / Recommendation Clinical Impression  Pt seen for f/u post MBSS and initiation of an oral diet. 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  She stated choice to continue w/ this diet; adamently requesting "water" to drink and to have an oral diet. W/ the presence of laryngeal Penetration and risk for aspiration w/ both thin and Nectar consistencies, pt is at risk for negative sequelae from aspiration including Pulmonary decline and inability to meet nutritional needs.    Discussed results of MBSS(completed 08/01/2021) and swallowing strategies w/ pt during this session: utilizing swallowing strategies of SMALL single bites/sips, multiple, f/u DRY swallows, and use of throat clear/cough intermittently post swallows/boluses, pt appeared able to reduce the pharyngeal residue and reduce and/or clear material from the upper airway, laryngeal vestibule. Using SMALL single bites/sips appeared to aid in control and clearing. Strongly encouraged pt to use a strong cough at end of meals/po's to help clear airway. Also suggested to pt to use an incentive spirometer for Pulmonary exercising; one provided to her w/ model on using it frequently during the day. Contacted Dining Services re: certain drink/food items of pt's preference b/f leaving the room -- pt's Dysarthria impacts her intelligibility when using the phone, talking w/ others.  SLP completed education on the MBSS results again; aspiration and dysphagia; encouragement to continue her swallowing strategies to include STRONG throat clearing/cough w/ taking oral intake. Recommend f/u w/ Palliative Care for [place]; monitoring of status overall. ST  services can be available for further needs/education while pt is admitted. Pt denied any further questions at this time. MD updated.      HPI HPI: Pt is a 65 y.o. 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  She was diagnosed with head and neck cancer in 2021 and is currently receiving Keytruda and Cetuximab.  She presents to the hospital for complaints of nausea dry heaves which started after her last dose of cancer treatment on 07/23/2021. She admits to shortness of breath with exertion.  Imaging in chart -- "Multifocal groundglass attenuation and septal thickening in the lungs bilaterally which are worse when compared to PET/CT on 07/08/2021"; noted multifocal pneumonia and H&N Cancer (dx'd in 2021).  Last tx was 07/23/2021.   Pt reports dysphagia at home.  She is currently on a more liquid-puree diet; and Ensure.  MBSS completed this admit on 08/01/2021: "Moderate-Severe oropharyngeal phase dysphagia resulting in delayed pharyngeal swallow initiation and laryngeal Penetration and Aspiration of liquids, intermittently. Pt is at Risk for laryngeal Penetration and Aspiration of po's which in turn increases risk for negative sequelae from aspiration including Pulmonary decline.".  Pt chooses to remain on an oral diet at this time.  She is followed by Palliative Care for [place].      SLP Plan  All goals met      Recommendations for follow up therapy are one component of a multi-disciplinary discharge planning process, led by the attending physician.  Recommendations may be updated based on patient status, additional functional criteria and insurance authorization.  Recommendations  Diet recommendations: Dysphagia 1 (puree);Thin liquid (per pt request) Liquids provided via:  Cup Medication Administration: Crushed with puree (vs Whole if able to in Puree) Supervision: Patient able to self feed Compensations: Minimize environmental distractions;Slow rate;Small sips/bites;Lingual sweep for clearance of pocketing;Multiple dry swallows after each bite/sip;Follow solids with liquid;Clear throat intermittently;Clear throat after each swallow;Hard cough after swallow (cough at end of po's) Postural Changes and/or Swallow Maneuvers: Out of bed for meals;Seated upright 90 degrees;Upright 30-60 min after meal                General recommendations:  (Dietician f/u; Palliative Care f/u) Oral Care Recommendations: Oral care BID;Oral care before and after PO;Patient independent with oral care Follow up Recommendations: None (at this time) SLP Visit Diagnosis: Dysphagia, oropharyngeal phase (R13.12) (baseline H&N Cancer w/ txs) Plan: All goals met       GO                  Orinda Kenner, MS, CCC-SLP Speech Language Pathologist Rehab Services [phone] [facility]  08/06/2021, 3:02 PM

## 2021-08-06 NOTE — Progress Notes (Signed)
PROGRESS NOTE    Shannon Schroeder  YJE:[id number] DOB: Mar 27, 1956 DOA: 07/30/2021 PCP: Pcp, No   Assessment & Plan:   Principal Problem:   Acute respiratory failure with hypoxemia ([place]) Active Problems:   Bipolar affective disorder, current episode mixed ([place])   History of atrial fibrillation   Head and neck cancer ([place])   Neoplasm related pain   Pneumonitis   Generalized weakness   Pressure injury of skin   Palliative care encounter   Acute hypoxic respiratory failure: w/ hemoptysis. Etiology unclear, possibly aspiration vs 3 medications that can cause pneumonitis including keytruda, cetuximab and amiodarone. Completed abx course. Continue on IV lasix. Monitor I/Os   Dysphagia: continue on full liquid diet as per speech   Normocytic anemia: H&H are labile    Chronic thrombocytosis: will continue to monitor   COPD: w/o exacerbation. Continue on bronchodilators    Hypokalemia: potassium repleated    Right sided maxillary cancer, squamous cell invasive cancer: initially presented with a large right facial mass eroding into the maxillary area and multiple branches of the maxillary division of the right trigeminal nerve. S/p multiple cycles of chemotherapy-developed hypersensitivity to carboplatin in 5/22. Management as per onco, Dr. Tasia Catchings. Continue pain control with fentanyl patch, oxycodone, gabapentin   Bipolar affective disorder, current episode mixed. Continue on home dose of trileptal   PAF: continue to hold amiodarone. Continue on metoprolol, eliquis    Hx of brain aneurysm: s/p clipping in 2019   DVT prophylaxis: eliquis  Code Status: DNR Family Communication:  Disposition Plan: d/c back home. Pt refuses HH   Level of care: Med-Surg  Status is: Inpatient  Remains inpatient appropriate because:Inpatient level of care appropriate due to severity of illness  Dispo: The patient is from: Home              Anticipated d/c is to: Home              Patient currently  is not medically stable to d/c.   Difficult to place patient : unclear    Consultants:  Pulmon  Procedures:   Antimicrobials:    Subjective: Pt c/o malaise   Objective: Vitals:   08/06/21 0058 08/06/21 0530 08/06/21 0753 08/06/21 1142  BP: (!) 102/59 (!) 101/49 104/60 121/62  Pulse: 75 65 65 79  Resp: 18 18 20 20   Temp: 97.8 F (36.6 C) 97.9 F (36.6 C) 97.8 F (36.6 C) 98.2 F (36.8 C)  TempSrc: Oral Oral Oral Oral  SpO2: 98% 98% 98% 98%  Weight:      Height:        Intake/Output Summary (Last 24 hours) at 08/06/2021 1435 Last data filed at 08/06/2021 1037 Gross per 24 hour  Intake 600 ml  Output 850 ml  Net -250 ml   Filed Weights   07/31/21 0313  Weight: 57.3 kg    Examination:  General exam: Appears calm and comfortable  Respiratory system: diminished breath sounds b/l  Cardiovascular system: S1 & S2 +. No  rubs, gallops or clicks. Gastrointestinal system: Abdomen is nondistended, soft and nontender.  Normal bowel sounds heard. Central nervous system: Alert and oriented. Moves all extremities  Psychiatry: Judgement and insight appear normal. Flat mood and affect     Data Reviewed: I have personally reviewed following labs and imaging studies  CBC: Recent Labs  Lab 07/31/21 0600 08/02/21 1141 08/05/21 0549 08/06/21 1125  WBC 7.9 11.2* 8.5 17.1*  NEUTROABS  --  9.8*  --   --  HGB 8.7* 8.6* 9.4* 10.1*  HCT 27.5* 26.6* 29.1* 32.0*  MCV 92.3 93.7 92.7 92.8  PLT 494* 610* 579* 250*   Basic Metabolic Panel: Recent Labs  Lab 07/31/21 0600 08/01/21 0749 08/05/21 0549 08/06/21 1125  NA 137 141 135 135  K 3.0* 3.9 4.1 3.4*  CL 99 105 95* 93*  CO2 26 21* 32 32  GLUCOSE 77 95 111* 133*  BUN 11 18 17  29*  CREATININE 0.40* 0.33* <0.30* 0.38*  CALCIUM 8.0* 7.9* 8.2* 8.6*   GFR: Estimated Creatinine Clearance: 62.6 mL/min (A) (by C-G formula based on SCr of 0.38 mg/dL (L)). Liver Function Tests: No results for input(s): AST, ALT, ALKPHOS,  BILITOT, PROT, ALBUMIN in the last 168 hours. No results for input(s): LIPASE, AMYLASE in the last 168 hours. No results for input(s): AMMONIA in the last 168 hours. Coagulation Profile: No results for input(s): INR, PROTIME in the last 168 hours. Cardiac Enzymes: No results for input(s): CKTOTAL, CKMB, CKMBINDEX, TROPONINI in the last 168 hours. BNP (last 3 results) No results for input(s): PROBNP in the last 8760 hours. HbA1C: No results for input(s): HGBA1C in the last 72 hours. CBG: No results for input(s): GLUCAP in the last 168 hours. Lipid Profile: No results for input(s): CHOL, HDL, LDLCALC, TRIG, CHOLHDL, LDLDIRECT in the last 72 hours. Thyroid Function Tests: No results for input(s): TSH, T4TOTAL, FREET4, T3FREE, THYROIDAB in the last 72 hours. Anemia Panel: No results for input(s): VITAMINB12, FOLATE, FERRITIN, TIBC, IRON, RETICCTPCT in the last 72 hours.  Sepsis Labs: No results for input(s): PROCALCITON, LATICACIDVEN in the last 168 hours.   Recent Results (from the past 240 hour(s))  Blood Culture (routine x 2)     Status: None   Collection Time: 07/30/21  5:51 AM   Specimen: BLOOD  Result Value Ref Range Status   Specimen Description BLOOD LEFT HAND  Final   Special Requests   Final    BOTTLES DRAWN AEROBIC AND ANAEROBIC Blood Culture adequate volume   Culture   Final    NO GROWTH 5 DAYS Performed at [facility], [address], [place], [place] [address]    Report Status 08/04/2021 FINAL  Final  Blood Culture (routine x 2)     Status: None   Collection Time: 07/30/21  5:51 AM   Specimen: BLOOD  Result Value Ref Range Status   Specimen Description BLOOD RIGHT FOREARM  Final   Special Requests   Final    BOTTLES DRAWN AEROBIC AND ANAEROBIC Blood Culture adequate volume   Culture   Final    NO GROWTH 5 DAYS Performed at [facility], [address], [place], [place] [address]    Report Status 08/04/2021 FINAL  Final  Urine Culture      Status: Abnormal   Collection Time: 07/30/21  5:51 AM   Specimen: Urine, Random  Result Value Ref Range Status   Specimen Description   Final    URINE, RANDOM Performed at [facility], [address], [place], [place] [address]    Special Requests   Final    NONE Performed at [facility], [address] [place], [place] [address]    Culture (A)  Final    <10,000 COLONIES/mL INSIGNIFICANT GROWTH Performed at [facility], [place] [address], [place], [place] [address]    Report Status 07/31/2021 FINAL  Final  Resp Panel by RT-PCR (Flu A&B, Covid) Nasopharyngeal Swab     Status: None  Collection Time: 07/30/21  5:51 AM   Specimen: Nasopharyngeal Swab; Nasopharyngeal(NP) swabs in vial transport medium  Result Value Ref Range Status   SARS Coronavirus 2 by RT PCR NEGATIVE NEGATIVE Final    Comment: (NOTE) SARS-CoV-2 target nucleic acids are NOT DETECTED.  The SARS-CoV-2 RNA is generally detectable in upper respiratory specimens during the acute phase of infection. The lowest concentration of SARS-CoV-2 viral copies this assay can detect is 138 copies/mL. A negative result does not preclude SARS-Cov-2 infection and should not be used as the sole basis for treatment or other patient management decisions. A negative result may occur with  improper specimen collection/handling, submission of specimen other than nasopharyngeal swab, presence of viral mutation(s) within the areas targeted by this assay, and inadequate number of viral copies(<138 copies/mL). A negative result must be combined with clinical observations, patient history, and epidemiological information. The expected result is Negative.  Fact Sheet for Patients:  EntrepreneurPulse.com.au  Fact Sheet for Healthcare Providers:  IncredibleEmployment.be  This test is no t yet approved or cleared by the [place] FDA and  has been authorized for detection  and/or diagnosis of SARS-CoV-2 by FDA under an Emergency Use Authorization (EUA). This EUA will remain  in effect (meaning this test can be used) for the duration of the COVID-19 declaration under Section 564(b)(1) of the Act, 21 U.S.C.section 360bbb-3(b)(1), unless the authorization is terminated  or revoked sooner.       Influenza A by PCR NEGATIVE NEGATIVE Final   Influenza B by PCR NEGATIVE NEGATIVE Final    Comment: (NOTE) The Xpert Xpress SARS-CoV-2/FLU/RSV plus assay is intended as an aid in the diagnosis of influenza from Nasopharyngeal swab specimens and should not be used as a sole basis for treatment. Nasal washings and aspirates are unacceptable for Xpert Xpress SARS-CoV-2/FLU/RSV testing.  Fact Sheet for Patients: EntrepreneurPulse.com.au  Fact Sheet for Healthcare Providers: IncredibleEmployment.be  This test is not yet approved or cleared by the [place] FDA and has been authorized for detection and/or diagnosis of SARS-CoV-2 by FDA under an Emergency Use Authorization (EUA). This EUA will remain in effect (meaning this test can be used) for the duration of the COVID-19 declaration under Section 564(b)(1) of the Act, 21 U.S.C. section 360bbb-3(b)(1), unless the authorization is terminated or revoked.  Performed at [facility], [address] [place], [place] [address]          Radiology Studies: No results found.      Scheduled Meds:  apixaban  5 mg Oral BID   Chlorhexidine Gluconate Cloth  6 each Topical Daily   docusate sodium  100 mg Oral Daily   fentaNYL  1 patch Transdermal Q72H   gabapentin  600 mg Oral TID   influenza vaccine adjuvanted  0.5 mL Intramuscular Tomorrow-1000   methylPREDNISolone (SOLU-MEDROL) injection  40 mg Intravenous Q24H   metoprolol tartrate  12.5 mg Oral BID   mometasone-formoterol  2 puff Inhalation BID   multivitamin with minerals  1 tablet Oral Daily    oxcarbazepine  600 mg Oral BID   potassium chloride  20 mEq Oral Once   sodium chloride flush  10-40 mL Intracatheter Q12H   Continuous Infusions:   LOS: 7 days    Time spent: 20 mins     Wyvonnia Dusky, MD [facility] Pager [phone]  If 7PM-7AM, please contact night-coverage 08/06/2021, 2:35 PM

## 2021-08-06 NOTE — Progress Notes (Signed)
Hematology/Oncology Progress Note [facility] [phone] [phone]  Patient Care Team: Pcp, No as PCP - General Rockey Situ Kathlene November, MD as PCP - Cardiology (Cardiology) Earlie Server, MD as Consulting Physician (Oncology)   Name of the patient: Shannon Schroeder  [id number]  November 30, 1955  Date of visit: 08/06/21   INTERVAL HISTORY-  Patient had speech swallow evaluation done today.  Results is pending. Remains on nasal cannula oxygen, 2L.  No additional hemoptysis episodes.   Allergies  Allergen Reactions   Naproxen Swelling, Rash and Other (See Comments)   Aspirin Swelling and Other (See Comments)   Belladonna Alkaloids Rash   Fluoxetine Hives and Rash   Fluoxetine Hcl Other (See Comments) and Rash   Paroxetine Hcl Rash and Other (See Comments)   Phenobarbital Rash and Other (See Comments)   Prozac [Fluoxetine Hcl] Rash      Current Facility-Administered Medications:    acetaminophen (TYLENOL) suppository 650 mg, 650 mg, Rectal, Q4H PRN, Sharion Settler, NP, 650 mg at 07/31/21 2055   apixaban (ELIQUIS) tablet 5 mg, 5 mg, Oral, BID, Agbata, Tochukwu, MD, 5 mg at 08/06/21 0900   Chlorhexidine Gluconate Cloth 2 % PADS 6 each, 6 each, Topical, Daily, Rizwan, Eunice Blase, MD, 6 each at 08/06/21 0900   docusate sodium (COLACE) capsule 100 mg, 100 mg, Oral, Daily, Agbata, Tochukwu, MD, 100 mg at 08/05/21 0811   fentaNYL (DURAGESIC) 75 MCG/HR 1 patch, 1 patch, Transdermal, Q72H, Agbata, Tochukwu, MD, 1 patch at 08/05/21 1323   gabapentin (NEURONTIN) capsule 600 mg, 600 mg, Oral, TID, Agbata, Tochukwu, MD, 600 mg at 08/06/21 0900   influenza vaccine adjuvanted (FLUAD) injection 0.5 mL, 0.5 mL, Intramuscular, Tomorrow-1000, Agbata, Tochukwu, MD   ipratropium-albuterol (DUONEB) 0.5-2.5 (3) MG/3ML nebulizer solution 3 mL, 3 mL, Nebulization, Q6H PRN, Agbata, Tochukwu, MD   melatonin tablet 10 mg, 10 mg, Oral, QHS PRN, Agbata, Tochukwu, MD    methylPREDNISolone sodium succinate (SOLU-MEDROL) 40 mg/mL injection 40 mg, 40 mg, Intravenous, Q24H, Aleskerov, Fuad, MD, 40 mg at 08/06/21 0901   metoprolol tartrate (LOPRESSOR) tablet 12.5 mg, 12.5 mg, Oral, BID, Lanney Gins, Fuad, MD, 12.5 mg at 08/05/21 2120   mometasone-formoterol (DULERA) 100-5 MCG/ACT inhaler 2 puff, 2 puff, Inhalation, BID, Agbata, Tochukwu, MD, 2 puff at 08/06/21 0901   multivitamin with minerals tablet 1 tablet, 1 tablet, Oral, Daily, Agbata, Tochukwu, MD, 1 tablet at 08/05/21 0811   ondansetron (ZOFRAN) injection 4 mg, 4 mg, Intravenous, Q6H PRN, Debbe Odea, MD, 4 mg at 08/05/21 1448   Oxcarbazepine (TRILEPTAL) tablet 600 mg, 600 mg, Oral, BID, Agbata, Tochukwu, MD, 600 mg at 08/06/21 0923   oxyCODONE (Oxy IR/ROXICODONE) immediate release tablet 10-20 mg, 10-20 mg, Oral, Q4H PRN, Rito Ehrlich A, RPH, 10 mg at 08/04/21 2143   polyethylene glycol (MIRALAX / GLYCOLAX) packet 17 g, 17 g, Oral, Daily PRN, Agbata, Tochukwu, MD   sodium chloride flush (NS) 0.9 % injection 10-40 mL, 10-40 mL, Intracatheter, Q12H, Rizwan, Saima, MD, 10 mL at 08/06/21 1000   sodium chloride flush (NS) 0.9 % injection 10-40 mL, 10-40 mL, Intracatheter, PRN, Debbe Odea, MD   traZODone (DESYREL) tablet 50 mg, 50 mg, Oral, QHS PRN, Rito Ehrlich A, RPH, 50 mg at 08/05/21 2119  Facility-Administered Medications Ordered in Other Encounters:    heparin lock flush 100 UNIT/ML injection, , , ,    heparin lock flush 100 UNIT/ML injection, , , ,    heparin lock flush 100 unit/mL, 500 Units, Intravenous, Once, Earlie Server, MD  heparin lock flush 100 unit/mL, 500 Units, Intracatheter, Once PRN, Earlie Server, MD   sodium chloride flush (NS) 0.9 % injection 10 mL, 10 mL, Intravenous, PRN, Earlie Server, MD, 10 mL at 10/10/20 1040   sodium chloride flush (NS) 0.9 % injection 10 mL, 10 mL, Intravenous, PRN, Earlie Server, MD, 10 mL at 11/13/20 0836   sodium chloride flush (NS) 0.9 % injection 10 mL, 10 mL, Intravenous,  PRN, Earlie Server, MD, 10 mL at 12/30/20 6734   sodium chloride flush (NS) 0.9 % injection 10 mL, 10 mL, Intravenous, PRN, Earlie Server, MD, 10 mL at 01/14/21 0921   sodium chloride flush (NS) 0.9 % injection 10 mL, 10 mL, Intravenous, PRN, Earlie Server, MD, 10 mL at 06/11/21 0815   sodium chloride flush (NS) 0.9 % injection 10 mL, 10 mL, Intravenous, PRN, Earlie Server, MD, 10 mL at 07/09/21 0814   Physical exam:  Vitals:   08/06/21 0058 08/06/21 0530 08/06/21 0753 08/06/21 1142  BP: (!) 102/59 (!) 101/49 104/60 121/62  Pulse: 75 65 65 79  Resp: 18 18 20 20   Temp: 97.8 F (36.6 C) 97.9 F (36.6 C) 97.8 F (36.6 C) 98.2 F (36.8 C)  TempSrc: Oral Oral Oral Oral  SpO2: 98% 98% 98% 98%  Weight:      Height:       Physical Exam Constitutional:      General: She is not in acute distress.    Appearance: She is not diaphoretic.  HENT:     Head: Normocephalic and atraumatic.     Nose: Nose normal.     Mouth/Throat:     Pharynx: No oropharyngeal exudate.     Comments:  Chronic right side palate deficiency. Right facial swelling Eyes:     General: No scleral icterus.    Pupils: Pupils are equal, round, and reactive to light.  Cardiovascular:     Rate and Rhythm: Normal rate and regular rhythm.     Heart sounds: No murmur heard. Pulmonary:     Effort: Pulmonary effort is normal. No respiratory distress.     Breath sounds: Wheezing present. No rales.  Abdominal:     General: There is no distension.     Palpations: Abdomen is soft.     Tenderness: There is no abdominal tenderness.  Musculoskeletal:        General: Normal range of motion.     Cervical back: Normal range of motion and neck supple.  Skin:    General: Skin is warm and dry.     Findings: No erythema.  Neurological:     Mental Status: She is alert and oriented to person, place, and time.     Cranial Nerves: No cranial nerve deficit.     Motor: No abnormal muscle tone.     Coordination: Coordination normal.  Psychiatric:         Mood and Affect: Mood and affect normal.       CMP Latest Ref Rng & Units 08/06/2021  Glucose 70 - 99 mg/dL 133(H)  BUN 8 - 23 mg/dL 29(H)  Creatinine 0.44 - 1.00 mg/dL 0.38(L)  Sodium 135 - 145 mmol/L 135  Potassium 3.5 - 5.1 mmol/L 3.4(L)  Chloride 98 - 111 mmol/L 93(L)  CO2 22 - 32 mmol/L 32  Calcium 8.9 - 10.3 mg/dL 8.6(L)  Total Protein 6.5 - 8.1 g/dL -  Total Bilirubin 0.3 - 1.2 mg/dL -  Alkaline Phos 38 - 126 U/L -  AST 15 - 41 U/L -  ALT 0 - 44 U/L -   CBC Latest Ref Rng & Units 08/06/2021  WBC 4.0 - 10.5 K/uL 17.1(H)  Hemoglobin 12.0 - 15.0 g/dL 10.1(L)  Hematocrit 36.0 - 46.0 % 32.0(L)  Platelets 150 - 400 K/uL 631(H)    RADIOGRAPHIC STUDIES: I have personally reviewed the radiological images as listed and agreed with the findings in the report. CT CHEST ABDOMEN PELVIS W CONTRAST  Result Date: 07/30/2021 CLINICAL DATA:  65 year old female with history of multilobar pneumonia on chest x-ray and abdominal pain. 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 Possible sepsis. EXAM: CT CHEST, ABDOMEN, AND PELVIS WITH CONTRAST TECHNIQUE: Multidetector CT imaging of the chest, abdomen and pelvis was performed following the standard protocol during bolus administration of intravenous contrast. CONTRAST:  23mL OMNIPAQUE IOHEXOL 350 MG/ML SOLN COMPARISON:  PET-CT 07/08/2021. FINDINGS: CT CHEST FINDINGS Cardiovascular: Heart size is normal. There is no significant pericardial fluid, thickening or pericardial calcification. Aortic atherosclerosis. No definite coronary artery calcifications. Left internal jugular single-lumen porta cath with tip terminating in the distal superior vena cava. Mediastinum/Nodes: Multiple prominent but nonenlarged mediastinal lymph nodes are nonspecific. No definite pathologic lymphadenopathy identified in mediastinal or hilar nodal stations. Esophagus is unremarkable in appearance. No axillary lymphadenopathy. Lungs/Pleura: Worsening patchy multifocal areas of  ground-glass attenuation, septal thickening and thickening of the peribronchovascular interstitium is seen throughout the lungs bilaterally with no discernible craniocaudal gradient. No confluent consolidative airspace disease. No pleural effusions. No definite suspicious appearing pulmonary nodules or masses are noted. Musculoskeletal: There are no aggressive appearing lytic or blastic lesions noted in the visualized portions of the skeleton. CT ABDOMEN PELVIS FINDINGS Hepatobiliary: No suspicious cystic or solid hepatic lesions. No intra or extrahepatic biliary ductal dilatation. Status post cholecystectomy. Pancreas: No pancreatic mass. No pancreatic ductal dilatation. No pancreatic or peripancreatic fluid collections or inflammatory changes. Spleen: Unremarkable. Adrenals/Urinary Tract: Bilateral kidneys and adrenal glands are normal in appearance. No hydroureteronephrosis. Urinary bladder is normal in appearance. Stomach/Bowel: The appearance of the stomach is normal. There is no pathologic dilatation of small bowel or colon. Normal appendix. Vascular/Lymphatic: Aortic atherosclerosis, without evidence of aneurysm or dissection in the abdominal or pelvic vasculature. No lymphadenopathy noted in the abdomen or pelvis. Reproductive: Status post hysterectomy. Ovaries are not confidently identified may be surgically absent or atrophic. Other: No significant volume of ascites.  No pneumoperitoneum. Musculoskeletal: Chronic compression fracture of anterior aspect of the superior endplate of L1, similar to the prior study from 05/01/2021 with minimal loss of anterior vertebral body height. There are no aggressive appearing lytic or blastic lesions noted in the visualized portions of the skeleton. IMPRESSION: 1. Marked worsening multifocal ground-glass attenuation and septal thickening in the lungs bilaterally when compared to prior PET-CT 07/08/2021. Findings are favored to reflect a drug-induced pneumonitis, but  could alternatively reflect multilobar bilateral pneumonia from atypical infection. Further clinical evaluation is recommended. 2. No acute findings are noted in the abdomen or pelvis. 3. No definite signs of metastatic disease in the chest, abdomen or pelvis. 4. Aortic atherosclerosis. 5. Additional incidental findings, as above. Electronically Signed   By: Vinnie Langton M.D.   On: 07/30/2021 06:53   NM PET Image Restag (PS) Skull Base To Thigh  Result Date: 07/09/2021 CLINICAL DATA:  Subsequent treatment strategy for head/neck cancer. EXAM: NUCLEAR MEDICINE PET SKULL BASE TO THIGH TECHNIQUE: 6.1 mCi F-18 FDG was injected intravenously. Full-ring PET imaging was performed from the skull base to thigh after the radiotracer. CT data was obtained and used for  attenuation correction and anatomic localization. Fasting blood glucose: 91 mg/dl COMPARISON:  CT abdomen 05/01/2021 and PET 01/09/2021. CT chest 09/26/2020. 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 No hypermetabolic lymph nodes. Incidental CT findings: Associated osseous destruction surrounding the right maxillary sinus. CHEST: No hypermetabolic mediastinal, hilar or axillary lymph nodes. No hypermetabolic pulmonary nodules. Incidental CT findings: Left IJ Port-A-Cath terminates in the SVC. Heart is at the upper limits of normal in size. No pericardial or pleural effusion. Basilar predominant peribronchovascular ground-glass and mild septal thickening. ABDOMEN/PELVIS: No abnormal hypermetabolism in the liver, adrenal glands, spleen or pancreas. No hypermetabolic lymph nodes. Incidental CT findings: Liver is unremarkable. Cholecystectomy. Adrenal glands, kidneys, spleen, pancreas, stomach and bowel are grossly  unremarkable. Atherosclerotic calcification of the aorta. SKELETON: No abnormal osseous hypermetabolism. Very mild hypermetabolism associated with a fracture of the superior endplate of L1 is new from 01/09/2021. Incidental CT findings: Degenerative changes in the spine. IMPRESSION: 1. Continued response to therapy as evidenced by decreased wall thickening and hypermetabolism associated with a cavitary right maxillary/nasopharyngeal mass. No evidence of distant metastatic disease. 2. Subacute compression of the L1 superior endplate, new from 01/09/2021. 3.  Aortic atherosclerosis (ICD10-I70.0). Electronically Signed   By: Lorin Picket M.D.   On: 07/09/2021 13:28   DG Chest Port 1 View  Result Date: 08/03/2021 CLINICAL DATA:  Pneumonitis.  History of cancer. EXAM: PORTABLE CHEST 1 VIEW COMPARISON:  July 29, 2021 FINDINGS: Diffuse bilateral patchy pulmonary infiltrates. Stable left Port-A-Cath. No pneumothorax. No change in the cardiomediastinal silhouette. IMPRESSION: New diffuse bilateral pulmonary patchy infiltrates. The findings are most consistent with multifocal pneumonia. Edema considered less likely. Electronically Signed   By: Dorise Bullion III M.D.   On: 08/03/2021 08:16   DG Chest Port 1 View  Result Date: 07/29/2021 CLINICAL DATA:  65 year old female with history of cough and weakness for the past several days. EXAM: PORTABLE CHEST 1 VIEW COMPARISON:  Chest x-ray 12/30/2020. FINDINGS: Left-sided internal jugular single-lumen porta cath with tip terminating in the distal superior vena cava. Lung volumes are normal. Diffuse interstitial prominence and peribronchial cuffing. No confluent consolidative airspace disease. No pleural effusions. No pneumothorax. No pulmonary nodule or mass noted. Pulmonary vasculature and the cardiomediastinal silhouette are within normal limits. Atherosclerotic calcifications in the thoracic aorta. IMPRESSION: 1. The appearance the chest suggests severe  bronchitis, potentially with developing multilobar bronchopneumonia. 2. Aortic atherosclerosis. Electronically Signed   By: Vinnie Langton M.D.   On: 07/29/2021 19:39    Assessment and plan-   #acute respiratory failure.  Multifocal aspiration pneumonia versus drug-induced pneumonitis.  No further hemoptysis episodes. Continue IV Solu-Medrol.  Appreciate pulmonology input. Aspiration precaution.  She finishes pneumonia treatment with Ceftriaxon and Azithromycin today Wean Oxygen as tolerated.    #COPD, continue inhalers.  on IV steroids.  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  Hold treatment due to acute issue. very challenging situation with immunotherapy in the future due to the possibility of immunotherapy induced pneumonitis.  Prognosis is poor Palliative care service follow up   Neoplasm related pain Continue Fentanyl patch and oxycodone PRN Continue gabapentin and Trileptal.   Palliative care evaluation. OT/PT  Thank you for allowing me to participate in the care of this patient.   Earlie Server, MD, PhD 08/06/2021

## 2021-08-06 NOTE — Progress Notes (Signed)
Occupational Therapy Treatment Patient Details Name: Shannon Schroeder MRN: [id number] DOB: February 08, 1956 Today's Date: 08/06/2021   History of present illness Pt is a 65 y/o F admitted on 07/30/21 after presenting with c/o worsening malaise. CT chest done showing multifocal GGO centrally predominant with generous PA diameter. Pt is being treated for Bilateral multifocal pneumonia. PMH: AF on chronic anticoagulation therapy, hx of head & neck CA on chemo & immunotherapy, HTN, aneurysm of anterior cerebral artery, anxiety, arthritis, bipolar, COPD, seizures   OT comments  Upon entering the room, pt supine in bed and agreeable to OT intervention. At rest, pt is on 2 L O2 via [place]. Pt performed bed mobility without assistance but needing increased time to perform task. O2 increased to 3L with functional tasks.Pt standing with supervision and taking several steps with min guard to sink. Pt standing and washing hair while standing for ~ 10 minutes with min guard. Pt holding onto sink and performing standing marches x 10 reps each LE.Pt taking seated rest break secondary to fatigue and dizziness. Pt performed 10 leg lifts while seated on EOB and then returning to supine without assistance. Pt's O2 remained above 90% while on 3 Ls this session but decreased to 2Ls when returning to rest. All needs within reach upon exiting the room.    Recommendations for follow up therapy are one component of a multi-disciplinary discharge planning process, led by the attending physician.  Recommendations may be updated based on patient status, additional functional criteria and insurance authorization.    Follow Up Recommendations  Home health OT    Equipment Recommendations  3 in 1 bedside commode       Precautions / Restrictions Precautions Precautions: Fall Restrictions Weight Bearing Restrictions: No       Mobility Bed Mobility Overal bed mobility: Modified Independent Bed Mobility: Supine to Sit;Sit to  Supine     Supine to sit: HOB elevated;Modified independent (Device/Increase time) Sit to supine: Modified independent (Device/Increase time);HOB elevated   General bed mobility comments: increased time/effort with use of bed features    Transfers Overall transfer level: Needs assistance Equipment used: None Transfers: Sit to/from Stand Sit to Stand: Supervision Stand pivot transfers: Min guard            Balance Overall balance assessment: Needs assistance Sitting-balance support: Feet supported;Bilateral upper extremity supported Sitting balance-Leahy Scale: Good     Standing balance support: During functional activity;No upper extremity supported Standing balance-Leahy Scale: Fair Standing balance comment: B UE support in RW - repots feeling weak                           ADL either performed or assessed with clinical judgement   ADL Overall ADL's : Needs assistance/impaired     Grooming: Wash/dry hands;Wash/dry face;Min guard;Standing                                       Vision Patient Visual Report: No change from baseline            Cognition Arousal/Alertness: Awake/alert Behavior During Therapy: WFL for tasks assessed/performed Overall Cognitive Status: Within Functional Limits for tasks assessed  Pertinent Vitals/ Pain       Pain Assessment: No/denies pain         Frequency  Min 1X/week        Progress Toward Goals  OT Goals(current goals can now be found in the care plan section)  Progress towards OT goals: Progressing toward goals  Acute Rehab OT Goals Patient Stated Goal: go home OT Goal Formulation: With patient Time For Goal Achievement: 08/16/21 Potential to Achieve Goals: Good  Plan Discharge plan remains appropriate;Frequency remains appropriate       AM-PAC OT "6 Clicks" Daily Activity     Outcome Measure   Help from  another person eating meals?: None Help from another person taking care of personal grooming?: A Little Help from another person toileting, which includes using toliet, bedpan, or urinal?: A Little Help from another person bathing (including washing, rinsing, drying)?: A Little Help from another person to put on and taking off regular upper body clothing?: None Help from another person to put on and taking off regular lower body clothing?: A Little 6 Click Score: 20    End of Session Equipment Utilized During Treatment: Oxygen  OT Visit Diagnosis: Unsteadiness on feet (R26.81);Muscle weakness (generalized) (M62.81)   Activity Tolerance Patient tolerated treatment well   Patient Left in bed;with call bell/phone within reach;with bed alarm set   Nurse Communication Mobility status        Time: 5456-2563 OT Time Calculation (min): 24 min  Charges: OT General Charges $OT Visit: 1 Visit OT Treatments $Self Care/Home Management : 8-22 mins $Therapeutic Exercise: 8-22 mins  Darleen Crocker, MS, OTR/L , CBIS ascom [phone]  08/06/21, 3:33 PM

## 2021-08-06 NOTE — Plan of Care (Signed)

## 2021-08-06 NOTE — Progress Notes (Signed)
Pulmonary Medicine          Date: 08/06/2021,   MRN# [id number] Shannon Schroeder [birth year]     AdmissionWeight: 57.3 kg                 CurrentWeight: 57.3 kg   Referring physician: Dr Raul Del    CHIEF COMPLAINT:   Multifocal ground glass infiltrates of bilateral lungs   HISTORY OF PRESENT ILLNESS    This is a 65 y.o. female with medical history significant for AF on chronic anticoagulation therapy, history of head and neck cancer on chemotherapy and immunotherapy, history of hypertension who presents to the emergency room for evaluation of worsening malaise. Currently on chemo per med-onc Dr Tasia Catchings.  Reports NV symptoms post chemo.  She has chronic dysphagia.  Reports over last week worsening dyspnea on exertion . Initial labs were unremarkable except for chronic anemia.  RVP was negative.  CT chest done showing multifocal GGO centrally predominant with generous PA diameter. PCCM consult for further evaluation and treatment.   08/02/21- patient improved clinically.  She reports less dyspnea. She is developing crackles at bases bilaterally, will dc IVF NS 148ml/h patient is euvolemic.  Reducing steroids to 40 bid solumedrol IV.  PT/OT as ordered. CBC w diff repeat today due to hemoptysis with reduced h/h.    08/03/21-  patient is improved, she is participating with PT/OT now doing well with slight and transient desaturation and dyspnea to [facility] level. She had CXR with persistent airspace opacification bilaterally.  Radiography generally lags behind clinical improvement and I suspect this will get better over next 8 wks. Will test for other less likely possibilites via serology today including fungal/PCP.  CBC with diff consistent with anemia acutely with reactive thrombocytosis.  Appreciate collaboration with everyone involved.   08/04/21- patient was unable to ambulate due to severe dyspnea.  Inflammatory biomarkers are elevated and plan is to continue steroids at current dose  40 bid solumedrol.  I have ordered 1x lasix 20 due to crackles at bases posteriorly.  Her spO2 requirement has not worsened but has not improved either in past 24h.     08/05/21- patient improved finally, she is breathing less labored has less crackles at bases.  Diuresed >2L yesterday. Will order one more dose of lasix today. She is unable to perform IS due to altered anatomy of palate and upper airway. Reducing steroids today to once daily from BID.  Repeat CXR in am. Continue PT/OT.    08/06/21- patient is further improved , currently on 1.5L/min Alpha. She was able to walk around with PT had very transient mild desaturation only.  She is smiling and happy with progress,  Repeat Blood work and CXR in am orders placed. She is stil with bilateral wheezing but improved, will keep steroids at current dose solumedrol 40 daily IV.  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    Anxiety    Arthritis    Bipolar disorder (HCC)    COPD (chronic obstructive pulmonary disease) (HCC)    NO INHALERS   Depression    Dysrhythmia    H/O V TACH   Head and neck cancer ([place]) 07/29/2020   Headache    H/O MIGRAINES   Hypertension    Seizures ([place])    X1 AFTER FALL  SURGICAL HISTORY   Past Surgical History:  Procedure Laterality Date   ABDOMINAL HYSTERECTOMY     partial   BREAST SURGERY     biopsy   CARDIAC CATHETERIZATION     X 2   CARPAL TUNNEL RELEASE Right    CARPECTOMY HAND Right    CATARACT EXTRACTION W/PHACO Left 03/08/2018   Procedure: CATARACT EXTRACTION PHACO AND INTRAOCULAR LENS PLACEMENT ([place]);  Surgeon: Birder Robson, MD;  Location: [facility] ORS;  Service: Ophthalmology;  Laterality: Left;  [place] 00:55 AP% 13.5 CDE 7.52 Fluid pack lot # [id number]   CATARACT EXTRACTION W/PHACO Right 04/05/2018   Procedure: CATARACT EXTRACTION PHACO AND  INTRAOCULAR LENS PLACEMENT (IOC);  Surgeon: Birder Robson, MD;  Location: [facility] ORS;  Service: Ophthalmology;  Laterality: Right;  [place] 00:36 AP% 15.9 CDE 5.72 Fluid pack lot # [id number]   CEREBRAL ANEURYSM REPAIR     CHOLECYSTECTOMY     COLONOSCOPY WITH PROPOFOL N/A 04/26/2019   Procedure: COLONOSCOPY WITH PROPOFOL;  Surgeon: Jonathon Bellows, MD;  Location: [facility] ENDOSCOPY;  Service: Gastroenterology;  Laterality: N/A;   PORTACATH PLACEMENT Left 08/05/2020   Procedure: INSERTION PORT-A-CATH;  Surgeon: Herbert Pun, MD;  Location: [facility] ORS;  Service: General;  Laterality: Left;   TUBAL LIGATION       FAMILY HISTORY   Family History  Problem Relation Age of Onset   Hypertension Mother    Heart failure Mother    Hypertension Brother    Stroke Brother    Hypertension Son    Emphysema Maternal Aunt    Hypertension Paternal Aunt    Hypertension Paternal Uncle    Hypertension Maternal Grandmother    Hypertension Paternal Grandmother      SOCIAL HISTORY   Social History   Tobacco Use   Smoking status: Every Day    Packs/day: 0.25    Years: 50.00    Pack years: 12.50    Types: Cigarettes   Smokeless tobacco: Never  Vaping Use   Vaping Use: Never used  Substance Use Topics   Alcohol use: No   Drug use: No     MEDICATIONS    Home Medication:    Current Medication:  Current Facility-Administered Medications:    acetaminophen (TYLENOL) suppository 650 mg, 650 mg, Rectal, Q4H PRN, Sharion Settler, NP, 650 mg at 07/31/21 2055   apixaban (ELIQUIS) tablet 5 mg, 5 mg, Oral, BID, Agbata, Tochukwu, MD, 5 mg at 08/06/21 0900   Chlorhexidine Gluconate Cloth 2 % PADS 6 each, 6 each, Topical, Daily, Rizwan, Saima, MD, 6 each at 08/06/21 0900   docusate sodium (COLACE) capsule 100 mg, 100 mg, Oral, Daily, Agbata, Tochukwu, MD, 100 mg at 08/05/21 0811   fentaNYL (DURAGESIC) 75 MCG/HR 1 patch, 1 patch, Transdermal, Q72H, Agbata, Tochukwu, MD, 1 patch at 08/05/21 1323    gabapentin (NEURONTIN) capsule 600 mg, 600 mg, Oral, TID, Agbata, Tochukwu, MD, 600 mg at 08/06/21 0900   influenza vaccine adjuvanted (FLUAD) injection 0.5 mL, 0.5 mL, Intramuscular, Tomorrow-1000, Agbata, Tochukwu, MD   ipratropium-albuterol (DUONEB) 0.5-2.5 (3) MG/3ML nebulizer solution 3 mL, 3 mL, Nebulization, Q6H PRN, Agbata, Tochukwu, MD   melatonin tablet 10 mg, 10 mg, Oral, QHS PRN, Agbata, Tochukwu, MD   methylPREDNISolone sodium succinate (SOLU-MEDROL) 40 mg/mL injection 40 mg, 40 mg, Intravenous, Q24H, Ismail Graziani, MD, 40 mg at 08/06/21 0901   metoprolol tartrate (LOPRESSOR) tablet 12.5 mg, 12.5 mg, Oral, BID, Carnella Fryman, MD, 12.5 mg at 08/05/21 2120   mometasone-formoterol (DULERA) 100-5 MCG/ACT inhaler 2 puff, 2 puff,  Inhalation, BID, Agbata, Tochukwu, MD, 2 puff at 08/06/21 0901   multivitamin with minerals tablet 1 tablet, 1 tablet, Oral, Daily, Agbata, Tochukwu, MD, 1 tablet at 08/05/21 0811   ondansetron (ZOFRAN) injection 4 mg, 4 mg, Intravenous, Q6H PRN, Debbe Odea, MD, 4 mg at 08/05/21 1448   Oxcarbazepine (TRILEPTAL) tablet 600 mg, 600 mg, Oral, BID, Agbata, Tochukwu, MD, 600 mg at 08/06/21 7371   oxyCODONE (Oxy IR/ROXICODONE) immediate release tablet 10-20 mg, 10-20 mg, Oral, Q4H PRN, Rito Ehrlich A, RPH, 10 mg at 08/04/21 2143   polyethylene glycol (MIRALAX / GLYCOLAX) packet 17 g, 17 g, Oral, Daily PRN, Agbata, Tochukwu, MD   sodium chloride flush (NS) 0.9 % injection 10-40 mL, 10-40 mL, Intracatheter, Q12H, Rizwan, Saima, MD, 10 mL at 08/05/21 2121   sodium chloride flush (NS) 0.9 % injection 10-40 mL, 10-40 mL, Intracatheter, PRN, Debbe Odea, MD   traZODone (DESYREL) tablet 50 mg, 50 mg, Oral, QHS PRN, Rito Ehrlich A, RPH, 50 mg at 08/05/21 2119  Facility-Administered Medications Ordered in Other Encounters:    heparin lock flush 100 UNIT/ML injection, , , ,    heparin lock flush 100 UNIT/ML injection, , , ,    heparin lock flush 100 unit/mL, 500 Units,  Intravenous, Once, Earlie Server, MD   heparin lock flush 100 unit/mL, 500 Units, Intracatheter, Once PRN, Earlie Server, MD   sodium chloride flush (NS) 0.9 % injection 10 mL, 10 mL, Intravenous, PRN, Earlie Server, MD, 10 mL at 10/10/20 1040   sodium chloride flush (NS) 0.9 % injection 10 mL, 10 mL, Intravenous, PRN, Earlie Server, MD, 10 mL at 11/13/20 0836   sodium chloride flush (NS) 0.9 % injection 10 mL, 10 mL, Intravenous, PRN, Earlie Server, MD, 10 mL at 12/30/20 0626   sodium chloride flush (NS) 0.9 % injection 10 mL, 10 mL, Intravenous, PRN, Earlie Server, MD, 10 mL at 01/14/21 9485   sodium chloride flush (NS) 0.9 % injection 10 mL, 10 mL, Intravenous, PRN, Earlie Server, MD, 10 mL at 06/11/21 0815   sodium chloride flush (NS) 0.9 % injection 10 mL, 10 mL, Intravenous, PRN, Earlie Server, MD, 10 mL at 07/09/21 4627    ALLERGIES   Naproxen, Aspirin, Belladonna alkaloids, Fluoxetine, Fluoxetine hcl, Paroxetine hcl, Phenobarbital, and Prozac [fluoxetine hcl]     REVIEW OF SYSTEMS    Review of Systems:  Gen:  Denies  fever, sweats, chills weigh loss  HEENT: Denies blurred vision, double vision, ear pain, eye pain, hearing loss, nose bleeds, sore throat Cardiac:  No dizziness, chest pain or heaviness, chest tightness,edema Resp:   Denies cough or sputum porduction, shortness of breath,wheezing, hemoptysis,  Gi: Denies swallowing difficulty, stomach pain, nausea or vomiting, diarrhea, constipation, bowel incontinence Gu:  Denies bladder incontinence, burning urine Ext:   Denies Joint pain, stiffness or swelling Skin: Denies  skin rash, easy bruising or bleeding or hives Endoc:  Denies polyuria, polydipsia , polyphagia or weight change Psych:   Denies depression, insomnia or hallucinations   Other:  All other systems negative   VS: BP 104/60 (BP Location: Left Arm)   Pulse 65   Temp 97.8 F (36.6 C) (Oral)   Resp 20   Ht 5\' 6"  (1.676 m)   Wt 57.3 kg   LMP 11/19/1988 (Exact Date)   SpO2 98%   BMI 20.39  kg/m      PHYSICAL EXAM    GENERAL:NAD, no fevers, chills, no weakness no fatigue HEAD: Normocephalic, atraumatic.  EYES: Pupils equal,  round, reactive to light. Extraocular muscles intact. No scleral icterus.  MOUTH: Moist mucosal membrane. Dentition intact. No abscess noted.  EAR, NOSE, THROAT: Clear without exudates. No external lesions.  NECK: Supple. No thyromegaly. No nodules. No JVD.  PULMONARY: bilateral mild rhonchi with decreased air entry and mild crackles at bases posteriorly  CARDIOVASCULAR: S1 and S2. Regular rate and rhythm. No murmurs, rubs, or gallops. No edema. Pedal pulses 2+ bilaterally.  GASTROINTESTINAL: Soft, nontender, nondistended. No masses. Positive bowel sounds. No hepatosplenomegaly.  MUSCULOSKELETAL: No swelling, clubbing, or edema. Range of motion full in all extremities.  NEUROLOGIC: Cranial nerves II through XII are intact. No gross focal neurological deficits. Sensation intact. Reflexes intact.  SKIN: No ulceration, lesions, rashes, or cyanosis. Skin warm and dry. Turgor intact.  PSYCHIATRIC: Mood, affect within normal limits. The patient is awake, alert and oriented x 3. Insight, judgment intact.       IMAGING    CT CHEST ABDOMEN PELVIS W CONTRAST  Result Date: 07/30/2021 CLINICAL DATA:  65 year old female with history of multilobar pneumonia on chest x-ray and abdominal pain. 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 Possible sepsis. EXAM: CT CHEST, ABDOMEN, AND PELVIS WITH CONTRAST TECHNIQUE: Multidetector CT imaging of the chest, abdomen and pelvis was performed following the standard protocol during bolus administration of intravenous contrast. CONTRAST:  60mL OMNIPAQUE IOHEXOL 350 MG/ML SOLN COMPARISON:  PET-CT 07/08/2021. FINDINGS: CT CHEST FINDINGS Cardiovascular: Heart size is normal. There is no significant pericardial fluid, thickening or pericardial calcification. Aortic atherosclerosis. No definite coronary artery calcifications. Left  internal jugular single-lumen porta cath with tip terminating in the distal superior vena cava. Mediastinum/Nodes: Multiple prominent but nonenlarged mediastinal lymph nodes are nonspecific. No definite pathologic lymphadenopathy identified in mediastinal or hilar nodal stations. Esophagus is unremarkable in appearance. No axillary lymphadenopathy. Lungs/Pleura: Worsening patchy multifocal areas of ground-glass attenuation, septal thickening and thickening of the peribronchovascular interstitium is seen throughout the lungs bilaterally with no discernible craniocaudal gradient. No confluent consolidative airspace disease. No pleural effusions. No definite suspicious appearing pulmonary nodules or masses are noted. Musculoskeletal: There are no aggressive appearing lytic or blastic lesions noted in the visualized portions of the skeleton. CT ABDOMEN PELVIS FINDINGS Hepatobiliary: No suspicious cystic or solid hepatic lesions. No intra or extrahepatic biliary ductal dilatation. Status post cholecystectomy. Pancreas: No pancreatic mass. No pancreatic ductal dilatation. No pancreatic or peripancreatic fluid collections or inflammatory changes. Spleen: Unremarkable. Adrenals/Urinary Tract: Bilateral kidneys and adrenal glands are normal in appearance. No hydroureteronephrosis. Urinary bladder is normal in appearance. Stomach/Bowel: The appearance of the stomach is normal. There is no pathologic dilatation of small bowel or colon. Normal appendix. Vascular/Lymphatic: Aortic atherosclerosis, without evidence of aneurysm or dissection in the abdominal or pelvic vasculature. No lymphadenopathy noted in the abdomen or pelvis. Reproductive: Status post hysterectomy. Ovaries are not confidently identified may be surgically absent or atrophic. Other: No significant volume of ascites.  No pneumoperitoneum. Musculoskeletal: Chronic compression fracture of anterior aspect of the superior endplate of L1, similar to the prior study  from 05/01/2021 with minimal loss of anterior vertebral body height. There are no aggressive appearing lytic or blastic lesions noted in the visualized portions of the skeleton. IMPRESSION: 1. Marked worsening multifocal ground-glass attenuation and septal thickening in the lungs bilaterally when compared to prior PET-CT 07/08/2021. Findings are favored to reflect a drug-induced pneumonitis, but could alternatively reflect multilobar bilateral pneumonia from atypical infection. Further clinical evaluation is recommended. 2. No acute findings are noted in the abdomen or pelvis. 3.  No definite signs of metastatic disease in the chest, abdomen or pelvis. 4. Aortic atherosclerosis. 5. Additional incidental findings, as above. Electronically Signed   By: Vinnie Langton M.D.   On: 07/30/2021 06:53   NM PET Image Restag (PS) Skull Base To Thigh  Result Date: 07/09/2021 CLINICAL DATA:  Subsequent treatment strategy for head/neck cancer. EXAM: NUCLEAR MEDICINE PET SKULL BASE TO THIGH TECHNIQUE: 6.1 mCi F-18 FDG was injected intravenously. Full-ring PET imaging was performed from the skull base to thigh after the radiotracer. CT data was obtained and used for attenuation correction and anatomic localization. Fasting blood glucose: 91 mg/dl COMPARISON:  CT abdomen 05/01/2021 and PET 01/09/2021. CT chest 09/26/2020. 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 No hypermetabolic lymph nodes. Incidental CT findings: Associated osseous destruction surrounding the right maxillary sinus. CHEST: No hypermetabolic mediastinal, hilar or axillary lymph nodes. No hypermetabolic pulmonary nodules. Incidental CT findings: Left IJ Port-A-Cath terminates in the SVC. Heart is at the upper limits of  normal in size. No pericardial or pleural effusion. Basilar predominant peribronchovascular ground-glass and mild septal thickening. ABDOMEN/PELVIS: No abnormal hypermetabolism in the liver, adrenal glands, spleen or pancreas. No hypermetabolic lymph nodes. Incidental CT findings: Liver is unremarkable. Cholecystectomy. Adrenal glands, kidneys, spleen, pancreas, stomach and bowel are grossly unremarkable. Atherosclerotic calcification of the aorta. SKELETON: No abnormal osseous hypermetabolism. Very mild hypermetabolism associated with a fracture of the superior endplate of L1 is new from 01/09/2021. Incidental CT findings: Degenerative changes in the spine. IMPRESSION: 1. Continued response to therapy as evidenced by decreased wall thickening and hypermetabolism associated with a cavitary right maxillary/nasopharyngeal mass. No evidence of distant metastatic disease. 2. Subacute compression of the L1 superior endplate, new from 01/09/2021. 3.  Aortic atherosclerosis (ICD10-I70.0). Electronically Signed   By: Lorin Picket M.D.   On: 07/09/2021 13:28   DG Chest Port 1 View  Result Date: 08/03/2021 CLINICAL DATA:  Pneumonitis.  History of cancer. EXAM: PORTABLE CHEST 1 VIEW COMPARISON:  July 29, 2021 FINDINGS: Diffuse bilateral patchy pulmonary infiltrates. Stable left Port-A-Cath. No pneumothorax. No change in the cardiomediastinal silhouette. IMPRESSION: New diffuse bilateral pulmonary patchy infiltrates. The findings are most consistent with multifocal pneumonia. Edema considered less likely. Electronically Signed   By: Dorise Bullion III M.D.   On: 08/03/2021 08:16   DG Chest Port 1 View  Result Date: 07/29/2021 CLINICAL DATA:  65 year old female with history of cough and weakness for the past several days. EXAM: PORTABLE CHEST 1 VIEW COMPARISON:  Chest x-ray 12/30/2020. FINDINGS: Left-sided internal jugular single-lumen porta cath with tip terminating in the distal superior vena cava. Lung  volumes are normal. Diffuse interstitial prominence and peribronchial cuffing. No confluent consolidative airspace disease. No pleural effusions. No pneumothorax. No pulmonary nodule or mass noted. Pulmonary vasculature and the cardiomediastinal silhouette are within normal limits. Atherosclerotic calcifications in the thoracic aorta. IMPRESSION: 1. The appearance the chest suggests severe bronchitis, potentially with developing multilobar bronchopneumonia. 2. Aortic atherosclerosis. Electronically Signed   By: Vinnie Langton M.D.   On: 07/29/2021 19:39      ASSESSMENT/PLAN   Bilateral multifocal pneumonia -suspect this is due to [place] however patient reports hemoptysis bright red blood and has significant decrement in hemoglobin.  Possible Alevolar hemorrhage in context of pnemonitis with concomitant anticoagulation.  Although she  is on IV fluids remainder of cell lines are not proportionally reduced which suggests blood loss.  I agree with steroids.  She is improved already and should be better in 48 hours with steroids.  Agree with empiric abx.  She has received only 2 doses of Keytruda and I would suggest trying to give this again next month after improvement. She has dysphagia and higher risk for aspiration. Noted s/p SLP. Will reduce solumedrol to 60 bid today.   -continue solumedrol 40 mg- CXR interval repeat,  -finish empiric CAP regimen -Continue PT/OT -Lasix stopped  Per primary team AF-metoprolol COPD-dulera, she uses advair HFA and Ventolin at home Head/Neck CA- per med onc Dr Tasia Catchings   Thank you for allowing me to participate in the care of this patient.   Patient/Family are satisfied with care plan and all questions have been answered.  This document was prepared using Dragon voice recognition software and may include unintentional dictation errors.     Ottie Glazier, M.D.  Division of [facility]

## 2021-08-06 NOTE — Progress Notes (Signed)
Physical Therapy Treatment Patient Details Name: Shannon Schroeder MRN: [id number] DOB: 1956-01-20 Today's Date: 08/06/2021   History of Present Illness Pt is a 65 y/o F admitted on 07/30/21 after presenting with c/o worsening malaise. CT chest done showing multifocal GGO centrally predominant with generous PA diameter. Pt is being treated for Bilateral multifocal pneumonia. PMH: AF on chronic anticoagulation therapy, hx of head & neck CA on chemo & immunotherapy, HTN, aneurysm of anterior cerebral artery, anxiety, arthritis, bipolar, COPD, seizures    PT Comments    Pt was long sitting in bed upon arriving. She is A and O x 4 and agreeable to session. On 3 L o2 throughout session. Easily able to exit R side of bed, stand to RW and ambulate ~ 16ft. No LOB or unsteadiness however does fatigue quickly. Sao2 dropped to 88% with HR elevation 140s. Overall pt is progressing. She does not want to go to rehab or have [facility] services even though its recommended. Acute PT will continue to follow and progress as able per current POC.    Recommendations for follow up therapy are one component of a multi-disciplinary discharge planning process, led by the attending physician.  Recommendations may be updated based on patient status, additional functional criteria and insurance authorization.  Follow Up Recommendations  Supervision for mobility/OOB;Home health PT     Equipment Recommendations  3in1 (PT)       Precautions / Restrictions Precautions Precautions: Fall Restrictions Weight Bearing Restrictions: No     Mobility  Bed Mobility Overal bed mobility: Modified Independent Bed Mobility: Supine to Sit;Sit to Supine     Supine to sit: HOB elevated;Modified independent (Device/Increase time) Sit to supine: Modified independent (Device/Increase time);HOB elevated   General bed mobility comments: increased time/effort with use of bed features    Transfers Overall transfer level: Needs  assistance Equipment used: None Transfers: Sit to/from Stand Sit to Stand: Supervision Stand pivot transfers: Min guard          Ambulation/Gait Ambulation/Gait assistance: Supervision Gait Distance (Feet): 75 Feet Assistive device: Rolling walker (2 wheeled) Gait Pattern/deviations: WFL(Within Functional Limits) Gait velocity: decreased   General Gait Details: pt was able to ambulate 75 ion rm with RW without LOB or unsteadiness however sao2 dropped to 88% and HR elevated to 142 bpm    Balance Overall balance assessment: Needs assistance Sitting-balance support: Feet supported;Bilateral upper extremity supported Sitting balance-Leahy Scale: Good     Standing balance support: During functional activity;No upper extremity supported Standing balance-Leahy Scale: Good Standing balance comment: B UE support in RW - repots feeling weak        Cognition Arousal/Alertness: Awake/alert Behavior During Therapy: WFL for tasks assessed/performed Overall Cognitive Status: Within Functional Limits for tasks assessed        General Comments: Pt A and O x 4       Pertinent Vitals/Pain Pain Assessment: No/denies pain     PT Goals (current goals can now be found in the care plan section) Acute Rehab PT Goals Patient Stated Goal: go home Progress towards PT goals: Progressing toward goals    Frequency    Min 2X/week      PT Plan Discharge plan needs to be updated       AM-PAC PT "6 Clicks" Mobility   Outcome Measure  Help needed turning from your back to your side while in a flat bed without using bedrails?: None Help needed moving from lying on your back to sitting on the side of  a flat bed without using bedrails?: None Help needed moving to and from a bed to a chair (including a wheelchair)?: A Little Help needed standing up from a chair using your arms (e.g., wheelchair or bedside chair)?: A Little Help needed to walk in hospital room?: A Little Help needed  climbing 3-5 steps with a railing? : A Lot 6 Click Score: 19    End of Session Equipment Utilized During Treatment: Oxygen;Gait belt Activity Tolerance: Patient limited by fatigue Patient left: in bed;with call bell/phone within reach;with bed alarm set;with nursing/sitter in room Nurse Communication: Mobility status PT Visit Diagnosis: Muscle weakness (generalized) (M62.81);Difficulty in walking, not elsewhere classified (R26.2);Unsteadiness on feet (R26.81)     Time: 9563-8756 PT Time Calculation (min) (ACUTE ONLY): 14 min  Charges:  $Gait Training: 8-22 mins                     Julaine Fusi PTA 08/06/21, 5:09 PM

## 2021-08-07 ENCOUNTER — Inpatient Hospital Stay: Payer: Medicare Other

## 2021-08-07 DIAGNOSIS — J9601 Acute respiratory failure with hypoxia: Secondary | ICD-10-CM | POA: Diagnosis not present

## 2021-08-07 DIAGNOSIS — C760 Malignant neoplasm of head, face and neck: Secondary | ICD-10-CM | POA: Diagnosis not present

## 2021-08-07 DIAGNOSIS — F3160 Bipolar disorder, current episode mixed, unspecified: Secondary | ICD-10-CM | POA: Diagnosis not present

## 2021-08-07 LAB — CBC
HCT: 27.9 % — ABNORMAL LOW (ref 36.0–46.0)
Hemoglobin: 8.9 g/dL — ABNORMAL LOW (ref 12.0–15.0)
MCH: 29.6 pg (ref 26.0–34.0)
MCHC: 31.9 g/dL (ref 30.0–36.0)
MCV: 92.7 fL (ref 80.0–100.0)
Platelets: 572 10*3/uL — ABNORMAL HIGH (ref 150–400)
RBC: 3.01 MIL/uL — ABNORMAL LOW (ref 3.87–5.11)
RDW: 16.8 % — ABNORMAL HIGH (ref 11.5–15.5)
WBC: 7.6 10*3/uL (ref 4.0–10.5)
nRBC: 0 % (ref 0.0–0.2)

## 2021-08-07 LAB — BASIC METABOLIC PANEL
Anion gap: 6 (ref 5–15)
BUN: 27 mg/dL — ABNORMAL HIGH (ref 8–23)
CO2: 35 mmol/L — ABNORMAL HIGH (ref 22–32)
Calcium: 8.3 mg/dL — ABNORMAL LOW (ref 8.9–10.3)
Chloride: 96 mmol/L — ABNORMAL LOW (ref 98–111)
Creatinine, Ser: 0.33 mg/dL — ABNORMAL LOW (ref 0.44–1.00)
GFR, Estimated: >60 mL/min (ref >60)
Glucose, Bld: 85 mg/dL (ref 70–99)
Potassium: 3.7 mmol/L (ref 3.5–5.1)
Sodium: 137 mmol/L (ref 135–145)

## 2021-08-07 LAB — C-REACTIVE PROTEIN: CRP: 5.5 mg/dL — ABNORMAL HIGH (ref <1.0)

## 2021-08-07 MED ORDER — PREDNISONE 50 MG PO TABS
50.0000 mg | ORAL_TABLET | Freq: Every day | ORAL | Status: DC
  Administered 2021-08-08: 50 mg via ORAL
  Filled 2021-08-07: qty 1

## 2021-08-07 NOTE — Progress Notes (Signed)
SATURATION QUALIFICATIONS: (This note is used to comply with regulatory documentation for home oxygen)  Patient Saturations on Room Air at Rest = 92 Patient Saturations on Room Air while Ambulating = 86%  Patient Saturations on 2 Liters of oxygen while Ambulating = 92%  Please briefly explain why patient needs home oxygen:

## 2021-08-07 NOTE — Progress Notes (Signed)
SATURATION QUALIFICATIONS: (This note is used to comply with regulatory documentation for home oxygen)  Patient Saturations on Room Air at Rest = 92%  Patient Saturations on Room Air while Ambulating = 86%  Patient Saturations on 2 Liters of oxygen while Ambulating = 92%  Please briefly explain why patient needs home oxygen:

## 2021-08-07 NOTE — TOC Transition Note (Signed)
Transition of Care [facility]) - CM/SW Discharge Note   Patient Details  Name: Shannon Schroeder MRN: [id number] Date of Birth: May 18, 1956  [facility]) CM/SW Contact:  Shelbie Hutching, RN Phone Number: 08/07/2021, 4:16 PM   Clinical Narrative:    Patient qualifies for home oxygen.  RNCM has ordered home oxygen from [employer].  Referral given and accepted by Thedore Mins at [place].  Oxygen will be delivered to the room.      Barriers to Discharge: Continued Medical Work up   Patient Goals and CMS Choice Patient states their goals for this hospitalization and ongoing recovery are:: Patient wants to go back home      Discharge Placement                       Discharge Plan and Services   Discharge Planning Services: CM Consult            DME Arranged: Oxygen DME Agency: [employer] Date DME Agency Contacted: 08/07/21 Time DME Agency Contacted: [id number] Representative spoke with at DME Agency: [place]: Refused SNF, Refused [facility]          Social Determinants of Health ([place]) Interventions     Readmission Risk Interventions Readmission Risk Prevention Plan 08/01/2021 09/08/2020  Transportation Screening Complete Complete  PCP or Specialist Appt within 3-5 Days Complete Complete  HRI or [place] Complete Complete  Social Work Consult for [place] Planning/Counseling Complete Complete  Palliative Care Screening Complete Not Applicable  Medication Review [employer] photographer) Complete Complete  Some recent data might be hidden

## 2021-08-07 NOTE — Progress Notes (Signed)
PROGRESS NOTE    LEVAEH VICE  HGD:[id number] DOB: 03/14/1956 DOA: 07/30/2021 PCP: Pcp, No   Assessment & Plan:   Principal Problem:   Acute respiratory failure with hypoxemia ([place]) Active Problems:   Bipolar affective disorder, current episode mixed ([place])   History of atrial fibrillation   Head and neck cancer ([place])   Neoplasm related pain   Pneumonitis   Generalized weakness   Pressure injury of skin   Palliative care encounter   Acute hypoxic respiratory failure: w/ hemoptysis. Etiology unclear, possibly aspiration vs 3 medications that can cause pneumonitis including keytruda, cetuximab and amiodarone. Completed abx course. Monitor I/Os. Continue on IV steroids as per pulmon. Oxygen desaturation test will be done today   Dysphagia: continue on full liquid diet as per speech   Normocytic anemia: H&H labile. Will transfuse if Hb < 7.0    Chronic thrombocytosis: labile.   COPD: w/o exacerbation. Continue on bronchodilators    Hypokalemia: WNL today    Right sided maxillary cancer, squamous cell invasive cancer: initially presented with a large right facial mass eroding into the maxillary area and multiple branches of the maxillary division of the right trigeminal nerve. S/p multiple cycles of chemotherapy-developed hypersensitivity to carboplatin in 5/22. Management as per onco, Dr. Tasia Catchings. Continue pain control with fentanyl patch, oxycodone, gabapentin   Bipolar affective disorder: current episode mixed. Continue on home dose of trileptal   PAF: continue on metoprolol, eliquis. Continue to hold amiodarone    Hx of brain aneurysm: s/p clipping in 2019   DVT prophylaxis: eliquis  Code Status: DNR Family Communication:  Disposition Plan: d/c back home. Pt refuses HH   Level of care: Med-Surg  Status is: Inpatient  Remains inpatient appropriate because:Inpatient level of care appropriate due to severity of illness  Dispo: The patient is from: Home               Anticipated d/c is to: Home              Patient currently is not medically stable to d/c.   Difficult to place patient : unclear    Consultants:  Pulmon  Procedures:   Antimicrobials:    Subjective: Pt c/o feeling cold   Objective: Vitals:   08/06/21 1142 08/06/21 1533 08/06/21 2005 08/07/21 0538  BP: 121/62 132/79 131/66 111/69  Pulse: 79 (!) 101 79 69  Resp: 20 20 18 16   Temp: 98.2 F (36.8 C) 98.2 F (36.8 C) (!) 97.5 F (36.4 C) 98.3 F (36.8 C)  TempSrc: Oral Oral Oral   SpO2: 98% 97% 96% 100%  Weight:      Height:        Intake/Output Summary (Last 24 hours) at 08/07/2021 0740 Last data filed at 08/07/2021 0034 Gross per 24 hour  Intake 600 ml  Output 650 ml  Net -50 ml   Filed Weights   07/31/21 0313  Weight: 57.3 kg    Examination:  General exam: Appears comfortable. Appears older than stated age  Respiratory system: decreased breath sounds b/l  Cardiovascular system: S1/S2+. No rubs or clicks  Gastrointestinal system: Abd is soft, NT, ND & normal bowel sounds  Central nervous system: Alert and oriented. Moves all extremities   Psychiatry: Judgement and insight appear normal. Flat mood and affect     Data Reviewed: I have personally reviewed following labs and imaging studies  CBC: Recent Labs  Lab 08/02/21 1141 08/05/21 0549 08/06/21 1125 08/07/21 0420  WBC 11.2* 8.5 17.1* 7.6  NEUTROABS 9.8*  --   --   --   HGB 8.6* 9.4* 10.1* 8.9*  HCT 26.6* 29.1* 32.0* 27.9*  MCV 93.7 92.7 92.8 92.7  PLT 610* 579* 631* 053*   Basic Metabolic Panel: Recent Labs  Lab 08/01/21 0749 08/05/21 0549 08/06/21 1125 08/07/21 0420  NA 141 135 135 137  K 3.9 4.1 3.4* 3.7  CL 105 95* 93* 96*  CO2 21* 32 32 35*  GLUCOSE 95 111* 133* 85  BUN 18 17 29* 27*  CREATININE 0.33* <0.30* 0.38* 0.33*  CALCIUM 7.9* 8.2* 8.6* 8.3*   GFR: Estimated Creatinine Clearance: 62.6 mL/min (A) (by C-G formula based on SCr of 0.33 mg/dL (L)). Liver Function Tests: No  results for input(s): AST, ALT, ALKPHOS, BILITOT, PROT, ALBUMIN in the last 168 hours. No results for input(s): LIPASE, AMYLASE in the last 168 hours. No results for input(s): AMMONIA in the last 168 hours. Coagulation Profile: No results for input(s): INR, PROTIME in the last 168 hours. Cardiac Enzymes: No results for input(s): CKTOTAL, CKMB, CKMBINDEX, TROPONINI in the last 168 hours. BNP (last 3 results) No results for input(s): PROBNP in the last 8760 hours. HbA1C: No results for input(s): HGBA1C in the last 72 hours. CBG: No results for input(s): GLUCAP in the last 168 hours. Lipid Profile: No results for input(s): CHOL, HDL, LDLCALC, TRIG, CHOLHDL, LDLDIRECT in the last 72 hours. Thyroid Function Tests: No results for input(s): TSH, T4TOTAL, FREET4, T3FREE, THYROIDAB in the last 72 hours. Anemia Panel: No results for input(s): VITAMINB12, FOLATE, FERRITIN, TIBC, IRON, RETICCTPCT in the last 72 hours.  Sepsis Labs: No results for input(s): PROCALCITON, LATICACIDVEN in the last 168 hours.   Recent Results (from the past 240 hour(s))  Blood Culture (routine x 2)     Status: None   Collection Time: 07/30/21  5:51 AM   Specimen: BLOOD  Result Value Ref Range Status   Specimen Description BLOOD LEFT HAND  Final   Special Requests   Final    BOTTLES DRAWN AEROBIC AND ANAEROBIC Blood Culture adequate volume   Culture   Final    NO GROWTH 5 DAYS Performed at [facility], [address], [place], [place] [address]    Report Status 08/04/2021 FINAL  Final  Blood Culture (routine x 2)     Status: None   Collection Time: 07/30/21  5:51 AM   Specimen: BLOOD  Result Value Ref Range Status   Specimen Description BLOOD RIGHT FOREARM  Final   Special Requests   Final    BOTTLES DRAWN AEROBIC AND ANAEROBIC Blood Culture adequate volume   Culture   Final    NO GROWTH 5 DAYS Performed at [facility], [address] [place], [place] [address]    Report Status  08/04/2021 FINAL  Final  Urine Culture     Status: Abnormal   Collection Time: 07/30/21  5:51 AM   Specimen: Urine, Random  Result Value Ref Range Status   Specimen Description   Final    URINE, RANDOM Performed at [facility], [address], [place], [place] [address]    Special Requests   Final    NONE Performed at [facility], [address] [place], [place] [address]    Culture (A)  Final    <10,000 COLONIES/mL INSIGNIFICANT GROWTH Performed at [facility], [address] [place], [place] [address]    Report Status 07/31/2021 FINAL  Final  Resp Panel by RT-PCR (Flu  A&B, Covid) Nasopharyngeal Swab     Status: None   Collection Time: 07/30/21  5:51 AM   Specimen: Nasopharyngeal Swab; Nasopharyngeal(NP) swabs in vial transport medium  Result Value Ref Range Status   SARS Coronavirus 2 by RT PCR NEGATIVE NEGATIVE Final    Comment: (NOTE) SARS-CoV-2 target nucleic acids are NOT DETECTED.  The SARS-CoV-2 RNA is generally detectable in upper respiratory specimens during the acute phase of infection. The lowest concentration of SARS-CoV-2 viral copies this assay can detect is 138 copies/mL. A negative result does not preclude SARS-Cov-2 infection and should not be used as the sole basis for treatment or other patient management decisions. A negative result may occur with  improper specimen collection/handling, submission of specimen other than nasopharyngeal swab, presence of viral mutation(s) within the areas targeted by this assay, and inadequate number of viral copies(<138 copies/mL). A negative result must be combined with clinical observations, patient history, and epidemiological information. The expected result is Negative.  Fact Sheet for Patients:  EntrepreneurPulse.com.au  Fact Sheet for Healthcare Providers:  IncredibleEmployment.be  This test is no t yet approved or cleared by the [place] FDA  and  has been authorized for detection and/or diagnosis of SARS-CoV-2 by FDA under an Emergency Use Authorization (EUA). This EUA will remain  in effect (meaning this test can be used) for the duration of the COVID-19 declaration under Section 564(b)(1) of the Act, 21 U.S.C.section 360bbb-3(b)(1), unless the authorization is terminated  or revoked sooner.       Influenza A by PCR NEGATIVE NEGATIVE Final   Influenza B by PCR NEGATIVE NEGATIVE Final    Comment: (NOTE) The Xpert Xpress SARS-CoV-2/FLU/RSV plus assay is intended as an aid in the diagnosis of influenza from Nasopharyngeal swab specimens and should not be used as a sole basis for treatment. Nasal washings and aspirates are unacceptable for Xpert Xpress SARS-CoV-2/FLU/RSV testing.  Fact Sheet for Patients: EntrepreneurPulse.com.au  Fact Sheet for Healthcare Providers: IncredibleEmployment.be  This test is not yet approved or cleared by the [place] FDA and has been authorized for detection and/or diagnosis of SARS-CoV-2 by FDA under an Emergency Use Authorization (EUA). This EUA will remain in effect (meaning this test can be used) for the duration of the COVID-19 declaration under Section 564(b)(1) of the Act, 21 U.S.C. section 360bbb-3(b)(1), unless the authorization is terminated or revoked.  Performed at [facility], [address] [place], [place] [address]          Radiology Studies: No results found.      Scheduled Meds:  apixaban  5 mg Oral BID   Chlorhexidine Gluconate Cloth  6 each Topical Daily   docusate sodium  100 mg Oral Daily   fentaNYL  1 patch Transdermal Q72H   gabapentin  600 mg Oral TID   influenza vaccine adjuvanted  0.5 mL Intramuscular Tomorrow-1000   methylPREDNISolone (SOLU-MEDROL) injection  40 mg Intravenous Q24H   metoprolol tartrate  12.5 mg Oral BID   mometasone-formoterol  2 puff Inhalation BID   multivitamin with  minerals  1 tablet Oral Daily   oxcarbazepine  600 mg Oral BID   sodium chloride flush  10-40 mL Intracatheter Q12H   Continuous Infusions:   LOS: 8 days    Time spent: 20 mins     Wyvonnia Dusky, MD [facility] Pager [phone]  If 7PM-7AM, please contact night-coverage 08/07/2021, 7:40 AM

## 2021-08-07 NOTE — Progress Notes (Signed)
Pulmonary Medicine          Date: 08/07/2021,   MRN# [id number] Shannon Schroeder 1956/04/25     AdmissionWeight: 57.3 kg                 CurrentWeight: 57.3 kg   Referring physician: Dr Raul Del    CHIEF COMPLAINT:   Multifocal ground glass infiltrates of bilateral lungs   HISTORY OF PRESENT ILLNESS    This is a 65 y.o. female with medical history significant for AF on chronic anticoagulation therapy, history of head and neck cancer on chemotherapy and immunotherapy, history of hypertension who presents to the emergency room for evaluation of worsening malaise. Currently on chemo per med-onc Dr Tasia Catchings.  Reports NV symptoms post chemo.  She has chronic dysphagia.  Reports over last week worsening dyspnea on exertion . Initial labs were unremarkable except for chronic anemia.  RVP was negative.  CT chest done showing multifocal GGO centrally predominant with generous PA diameter. PCCM consult for further evaluation and treatment.   08/02/21- patient improved clinically.  She reports less dyspnea. She is developing crackles at bases bilaterally, will dc IVF NS 153ml/h patient is euvolemic.  Reducing steroids to 40 bid solumedrol IV.  PT/OT as ordered. CBC w diff repeat today due to hemoptysis with reduced h/h.    08/03/21-  patient is improved, she is participating with PT/OT now doing well with slight and transient desaturation and dyspnea to [facility] level. She had CXR with persistent airspace opacification bilaterally.  Radiography generally lags behind clinical improvement and I suspect this will get better over next 8 wks. Will test for other less likely possibilites via serology today including fungal/PCP.  CBC with diff consistent with anemia acutely with reactive thrombocytosis.  Appreciate collaboration with everyone involved.   08/04/21- patient was unable to ambulate due to severe dyspnea.  Inflammatory biomarkers are elevated and plan is to continue steroids at current dose  40 bid solumedrol.  I have ordered 1x lasix 20 due to crackles at bases posteriorly.  Her spO2 requirement has not worsened but has not improved either in past 24h.     08/05/21- patient improved finally, she is breathing less labored has less crackles at bases.  Diuresed >2L yesterday. Will order one more dose of lasix today. She is unable to perform IS due to altered anatomy of palate and upper airway. Reducing steroids today to once daily from BID.  Repeat CXR in am. Continue PT/OT.    08/06/21- patient is further improved , currently on 1.5L/min [place]. She was able to walk around with PT had very transient mild desaturation only.  She is smiling and happy with progress,  Repeat Blood work and CXR in am orders placed. She is stil with bilateral wheezing but improved, will keep steroids at current dose solumedrol 40 daily IV.  Diuresis DCD patient euvolemic   08/07/21- patient is feeling well, she walked around more today.  She is 99% on 1LNC.  Mild wheezing is appreciated on left improved from previous. Will change steroids to PO pred 50 with tapering protocol.  CXR is not improved.        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    Anxiety    Arthritis    Bipolar disorder ([place])    COPD (chronic obstructive pulmonary disease) ([place])    NO INHALERS  Depression    Dysrhythmia    H/O V TACH   Head and neck cancer ([place]) 07/29/2020   Headache    H/O MIGRAINES   Hypertension    Seizures ([place])    X1 AFTER FALL     SURGICAL HISTORY   Past Surgical History:  Procedure Laterality Date   ABDOMINAL HYSTERECTOMY     partial   BREAST SURGERY     biopsy   CARDIAC CATHETERIZATION     X 2   CARPAL TUNNEL RELEASE Right    CARPECTOMY HAND Right    CATARACT EXTRACTION W/PHACO Left 03/08/2018   Procedure: CATARACT EXTRACTION PHACO AND INTRAOCULAR LENS PLACEMENT ([place]);  Surgeon:  Birder Robson, MD;  Location: [facility] ORS;  Service: Ophthalmology;  Laterality: Left;  [place] 00:55 AP% 13.5 CDE 7.52 Fluid pack lot # [id number]   CATARACT EXTRACTION W/PHACO Right 04/05/2018   Procedure: CATARACT EXTRACTION PHACO AND INTRAOCULAR LENS PLACEMENT ([facility]);  Surgeon: Birder Robson, MD;  Location: [facility] ORS;  Service: Ophthalmology;  Laterality: Right;  [place] 00:36 AP% 15.9 CDE 5.72 Fluid pack lot # [id number]   CEREBRAL ANEURYSM REPAIR     CHOLECYSTECTOMY     COLONOSCOPY WITH PROPOFOL N/A 04/26/2019   Procedure: COLONOSCOPY WITH PROPOFOL;  Surgeon: Jonathon Bellows, MD;  Location: [facility] ENDOSCOPY;  Service: Gastroenterology;  Laterality: N/A;   PORTACATH PLACEMENT Left 08/05/2020   Procedure: INSERTION PORT-A-CATH;  Surgeon: Herbert Pun, MD;  Location: [facility] ORS;  Service: General;  Laterality: Left;   TUBAL LIGATION       FAMILY HISTORY   Family History  Problem Relation Age of Onset   Hypertension Mother    Heart failure Mother    Hypertension Brother    Stroke Brother    Hypertension Son    Emphysema Maternal Aunt    Hypertension Paternal Aunt    Hypertension Paternal Uncle    Hypertension Maternal Grandmother    Hypertension Paternal Grandmother      SOCIAL HISTORY   Social History   Tobacco Use   Smoking status: Every Day    Packs/day: 0.25    Years: 50.00    Pack years: 12.50    Types: Cigarettes   Smokeless tobacco: Never  Vaping Use   Vaping Use: Never used  Substance Use Topics   Alcohol use: No   Drug use: No     MEDICATIONS    Home Medication:    Current Medication:  Current Facility-Administered Medications:    acetaminophen (TYLENOL) suppository 650 mg, 650 mg, Rectal, Q4H PRN, Sharion Settler, NP, 650 mg at 07/31/21 2055   apixaban (ELIQUIS) tablet 5 mg, 5 mg, Oral, BID, Agbata, Tochukwu, MD, 5 mg at 08/06/21 2029   Chlorhexidine Gluconate Cloth 2 % PADS 6 each, 6 each, Topical, Daily, Rizwan, Saima, MD, 6 each at 08/06/21 0900    docusate sodium (COLACE) capsule 100 mg, 100 mg, Oral, Daily, Agbata, Tochukwu, MD, 100 mg at 08/05/21 0811   fentaNYL (DURAGESIC) 75 MCG/HR 1 patch, 1 patch, Transdermal, Q72H, Agbata, Tochukwu, MD, 1 patch at 08/05/21 1323   gabapentin (NEURONTIN) capsule 600 mg, 600 mg, Oral, TID, Agbata, Tochukwu, MD, 600 mg at 08/06/21 2029   influenza vaccine adjuvanted (FLUAD) injection 0.5 mL, 0.5 mL, Intramuscular, Tomorrow-1000, Agbata, Tochukwu, MD   ipratropium-albuterol (DUONEB) 0.5-2.5 (3) MG/3ML nebulizer solution 3 mL, 3 mL, Nebulization, Q6H PRN, Agbata, Tochukwu, MD   melatonin tablet 10 mg, 10 mg, Oral, QHS PRN, Agbata, Tochukwu, MD   methylPREDNISolone sodium succinate (SOLU-MEDROL) 40 mg/mL injection  40 mg, 40 mg, Intravenous, Q24H, Lanney Gins, Xzavior Reinig, MD, 40 mg at 08/06/21 0901   metoprolol tartrate (LOPRESSOR) tablet 12.5 mg, 12.5 mg, Oral, BID, Lanney Gins, Fujie Dickison, MD, 12.5 mg at 08/06/21 2030   mometasone-formoterol (DULERA) 100-5 MCG/ACT inhaler 2 puff, 2 puff, Inhalation, BID, Agbata, Tochukwu, MD, 2 puff at 08/06/21 2030   multivitamin with minerals tablet 1 tablet, 1 tablet, Oral, Daily, Agbata, Tochukwu, MD, 1 tablet at 08/05/21 0811   ondansetron (ZOFRAN) injection 4 mg, 4 mg, Intravenous, Q6H PRN, Debbe Odea, MD, 4 mg at 08/05/21 1448   Oxcarbazepine (TRILEPTAL) tablet 600 mg, 600 mg, Oral, BID, Agbata, Tochukwu, MD, 600 mg at 08/06/21 2029   oxyCODONE (Oxy IR/ROXICODONE) immediate release tablet 10-20 mg, 10-20 mg, Oral, Q4H PRN, Rito Ehrlich A, RPH, 10 mg at 08/04/21 2143   polyethylene glycol (MIRALAX / GLYCOLAX) packet 17 g, 17 g, Oral, Daily PRN, Agbata, Tochukwu, MD   sodium chloride flush (NS) 0.9 % injection 10-40 mL, 10-40 mL, Intracatheter, Q12H, Rizwan, Saima, MD, 10 mL at 08/06/21 2157   sodium chloride flush (NS) 0.9 % injection 10-40 mL, 10-40 mL, Intracatheter, PRN, Debbe Odea, MD   traZODone (DESYREL) tablet 50 mg, 50 mg, Oral, QHS PRN, Rito Ehrlich A, RPH, 50 mg at  08/06/21 2030  Facility-Administered Medications Ordered in Other Encounters:    heparin lock flush 100 UNIT/ML injection, , , ,    heparin lock flush 100 UNIT/ML injection, , , ,    heparin lock flush 100 unit/mL, 500 Units, Intravenous, Once, Earlie Server, MD   heparin lock flush 100 unit/mL, 500 Units, Intracatheter, Once PRN, Earlie Server, MD   sodium chloride flush (NS) 0.9 % injection 10 mL, 10 mL, Intravenous, PRN, Earlie Server, MD, 10 mL at 10/10/20 1040   sodium chloride flush (NS) 0.9 % injection 10 mL, 10 mL, Intravenous, PRN, Earlie Server, MD, 10 mL at 11/13/20 0836   sodium chloride flush (NS) 0.9 % injection 10 mL, 10 mL, Intravenous, PRN, Earlie Server, MD, 10 mL at 12/30/20 3704   sodium chloride flush (NS) 0.9 % injection 10 mL, 10 mL, Intravenous, PRN, Earlie Server, MD, 10 mL at 01/14/21 8889   sodium chloride flush (NS) 0.9 % injection 10 mL, 10 mL, Intravenous, PRN, Earlie Server, MD, 10 mL at 06/11/21 0815   sodium chloride flush (NS) 0.9 % injection 10 mL, 10 mL, Intravenous, PRN, Earlie Server, MD, 10 mL at 07/09/21 1694    ALLERGIES   Naproxen, Aspirin, Belladonna alkaloids, Fluoxetine, Fluoxetine hcl, Paroxetine hcl, Phenobarbital, and Prozac [fluoxetine hcl]     REVIEW OF SYSTEMS    Review of Systems:  Gen:  Denies  fever, sweats, chills weigh loss  HEENT: Denies blurred vision, double vision, ear pain, eye pain, hearing loss, nose bleeds, sore throat Cardiac:  No dizziness, chest pain or heaviness, chest tightness,edema Resp:   Denies cough or sputum porduction, shortness of breath,wheezing, hemoptysis,  Gi: Denies swallowing difficulty, stomach pain, nausea or vomiting, diarrhea, constipation, bowel incontinence Gu:  Denies bladder incontinence, burning urine Ext:   Denies Joint pain, stiffness or swelling Skin: Denies  skin rash, easy bruising or bleeding or hives Endoc:  Denies polyuria, polydipsia , polyphagia or weight change Psych:   Denies depression, insomnia or hallucinations    Other:  All other systems negative   VS: BP 113/74 (BP Location: Left Arm)   Pulse 76   Temp 98.6 F (37 C)   Resp 18   Ht 5\' 6"  (1.676 m)  Wt 57.3 kg   LMP 11/19/1988 (Exact Date)   SpO2 100%   BMI 20.39 kg/m      PHYSICAL EXAM    GENERAL:NAD, no fevers, chills, no weakness no fatigue HEAD: Normocephalic, atraumatic.  EYES: Pupils equal, round, reactive to light. Extraocular muscles intact. No scleral icterus.  MOUTH: Moist mucosal membrane. Dentition intact. No abscess noted.  EAR, NOSE, THROAT: Clear without exudates. No external lesions.  NECK: Supple. No thyromegaly. No nodules. No JVD.  PULMONARY: bilateral mild rhonchi with decreased air entry and mild crackles at bases posteriorly  CARDIOVASCULAR: S1 and S2. Regular rate and rhythm. No murmurs, rubs, or gallops. No edema. Pedal pulses 2+ bilaterally.  GASTROINTESTINAL: Soft, nontender, nondistended. No masses. Positive bowel sounds. No hepatosplenomegaly.  MUSCULOSKELETAL: No swelling, clubbing, or edema. Range of motion full in all extremities.  NEUROLOGIC: Cranial nerves II through XII are intact. No gross focal neurological deficits. Sensation intact. Reflexes intact.  SKIN: No ulceration, lesions, rashes, or cyanosis. Skin warm and dry. Turgor intact.  PSYCHIATRIC: Mood, affect within normal limits. The patient is awake, alert and oriented x 3. Insight, judgment intact.       IMAGING    CT CHEST ABDOMEN PELVIS W CONTRAST  Result Date: 07/30/2021 CLINICAL DATA:  65 year old female with history of multilobar pneumonia on chest x-ray and abdominal pain. 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 Possible sepsis. EXAM: CT CHEST, ABDOMEN, AND PELVIS WITH CONTRAST TECHNIQUE: Multidetector CT imaging of the chest, abdomen and pelvis was performed following the standard protocol during bolus administration of intravenous contrast. CONTRAST:  69mL OMNIPAQUE IOHEXOL 350 MG/ML SOLN COMPARISON:  PET-CT 07/08/2021.  FINDINGS: CT CHEST FINDINGS Cardiovascular: Heart size is normal. There is no significant pericardial fluid, thickening or pericardial calcification. Aortic atherosclerosis. No definite coronary artery calcifications. Left internal jugular single-lumen porta cath with tip terminating in the distal superior vena cava. Mediastinum/Nodes: Multiple prominent but nonenlarged mediastinal lymph nodes are nonspecific. No definite pathologic lymphadenopathy identified in mediastinal or hilar nodal stations. Esophagus is unremarkable in appearance. No axillary lymphadenopathy. Lungs/Pleura: Worsening patchy multifocal areas of ground-glass attenuation, septal thickening and thickening of the peribronchovascular interstitium is seen throughout the lungs bilaterally with no discernible craniocaudal gradient. No confluent consolidative airspace disease. No pleural effusions. No definite suspicious appearing pulmonary nodules or masses are noted. Musculoskeletal: There are no aggressive appearing lytic or blastic lesions noted in the visualized portions of the skeleton. CT ABDOMEN PELVIS FINDINGS Hepatobiliary: No suspicious cystic or solid hepatic lesions. No intra or extrahepatic biliary ductal dilatation. Status post cholecystectomy. Pancreas: No pancreatic mass. No pancreatic ductal dilatation. No pancreatic or peripancreatic fluid collections or inflammatory changes. Spleen: Unremarkable. Adrenals/Urinary Tract: Bilateral kidneys and adrenal glands are normal in appearance. No hydroureteronephrosis. Urinary bladder is normal in appearance. Stomach/Bowel: The appearance of the stomach is normal. There is no pathologic dilatation of small bowel or colon. Normal appendix. Vascular/Lymphatic: Aortic atherosclerosis, without evidence of aneurysm or dissection in the abdominal or pelvic vasculature. No lymphadenopathy noted in the abdomen or pelvis. Reproductive: Status post hysterectomy. Ovaries are not confidently identified may  be surgically absent or atrophic. Other: No significant volume of ascites.  No pneumoperitoneum. Musculoskeletal: Chronic compression fracture of anterior aspect of the superior endplate of L1, similar to the prior study from 05/01/2021 with minimal loss of anterior vertebral body height. There are no aggressive appearing lytic or blastic lesions noted in the visualized portions of the skeleton. IMPRESSION: 1. Marked worsening multifocal ground-glass attenuation and septal thickening in the  lungs bilaterally when compared to prior PET-CT 07/08/2021. Findings are favored to reflect a drug-induced pneumonitis, but could alternatively reflect multilobar bilateral pneumonia from atypical infection. Further clinical evaluation is recommended. 2. No acute findings are noted in the abdomen or pelvis. 3. No definite signs of metastatic disease in the chest, abdomen or pelvis. 4. Aortic atherosclerosis. 5. Additional incidental findings, as above. Electronically Signed   By: Vinnie Langton M.D.   On: 07/30/2021 06:53   NM PET Image Restag (PS) Skull Base To Thigh  Result Date: 07/09/2021 CLINICAL DATA:  Subsequent treatment strategy for head/neck cancer. EXAM: NUCLEAR MEDICINE PET SKULL BASE TO THIGH TECHNIQUE: 6.1 mCi F-18 FDG was injected intravenously. Full-ring PET imaging was performed from the skull base to thigh after the radiotracer. CT data was obtained and used for attenuation correction and anatomic localization. Fasting blood glucose: 91 mg/dl COMPARISON:  CT abdomen 05/01/2021 and PET 01/09/2021. CT chest 09/26/2020. 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 No hypermetabolic lymph nodes. Incidental CT findings: Associated osseous destruction surrounding the right  maxillary sinus. CHEST: No hypermetabolic mediastinal, hilar or axillary lymph nodes. No hypermetabolic pulmonary nodules. Incidental CT findings: Left IJ Port-A-Cath terminates in the SVC. Heart is at the upper limits of normal in size. No pericardial or pleural effusion. Basilar predominant peribronchovascular ground-glass and mild septal thickening. ABDOMEN/PELVIS: No abnormal hypermetabolism in the liver, adrenal glands, spleen or pancreas. No hypermetabolic lymph nodes. Incidental CT findings: Liver is unremarkable. Cholecystectomy. Adrenal glands, kidneys, spleen, pancreas, stomach and bowel are grossly unremarkable. Atherosclerotic calcification of the aorta. SKELETON: No abnormal osseous hypermetabolism. Very mild hypermetabolism associated with a fracture of the superior endplate of L1 is new from 01/09/2021. Incidental CT findings: Degenerative changes in the spine. IMPRESSION: 1. Continued response to therapy as evidenced by decreased wall thickening and hypermetabolism associated with a cavitary right maxillary/nasopharyngeal mass. No evidence of distant metastatic disease. 2. Subacute compression of the L1 superior endplate, new from 01/09/2021. 3.  Aortic atherosclerosis (ICD10-I70.0). Electronically Signed   By: Lorin Picket M.D.   On: 07/09/2021 13:28   DG Chest Port 1 View  Result Date: 08/03/2021 CLINICAL DATA:  Pneumonitis.  History of cancer. EXAM: PORTABLE CHEST 1 VIEW COMPARISON:  July 29, 2021 FINDINGS: Diffuse bilateral patchy pulmonary infiltrates. Stable left Port-A-Cath. No pneumothorax. No change in the cardiomediastinal silhouette. IMPRESSION: New diffuse bilateral pulmonary patchy infiltrates. The findings are most consistent with multifocal pneumonia. Edema considered less likely. Electronically Signed   By: Dorise Bullion III M.D.   On: 08/03/2021 08:16   DG Chest Port 1 View  Result Date: 07/29/2021 CLINICAL DATA:  65 year old female with history of cough and  weakness for the past several days. EXAM: PORTABLE CHEST 1 VIEW COMPARISON:  Chest x-ray 12/30/2020. FINDINGS: Left-sided internal jugular single-lumen porta cath with tip terminating in the distal superior vena cava. Lung volumes are normal. Diffuse interstitial prominence and peribronchial cuffing. No confluent consolidative airspace disease. No pleural effusions. No pneumothorax. No pulmonary nodule or mass noted. Pulmonary vasculature and the cardiomediastinal silhouette are within normal limits. Atherosclerotic calcifications in the thoracic aorta. IMPRESSION: 1. The appearance the chest suggests severe bronchitis, potentially with developing multilobar bronchopneumonia. 2. Aortic atherosclerosis. Electronically Signed   By: Vinnie Langton M.D.   On: 07/29/2021 19:39  ASSESSMENT/PLAN   Bilateral multifocal pneumonia -suspect this is due to [facility] immunotherapy however patient reports hemoptysis bright red blood and has significant decrement in hemoglobin.  Possible Alevolar hemorrhage in context of pnemonitis with concomitant anticoagulation.  Although she is on IV fluids remainder of cell lines are not proportionally reduced which suggests blood loss.  I agree with steroids.  She is improved already and should be better in 48 hours with steroids.  Agree with empiric abx.  She has received only 2 doses of Keytruda and I would suggest trying to give this again next month after improvement. She has dysphagia and higher risk for aspiration. Noted s/p SLP. Will reduce solumedrol to 60 bid today.   -continue Pred 50 changed from solumedrol 40 mg- CXR interval repeat is not improved -finish empiric CAP regimen -Continue PT/OT -Lasix stopped  Per primary team AF-metoprolol COPD-dulera, she uses advair HFA and Ventolin at home Head/Neck CA- per med onc Dr Tasia Catchings   Thank you for allowing me to participate in the care of this patient.   Patient/Family are satisfied with care plan and all  questions have been answered.  This document was prepared using Dragon voice recognition software and may include unintentional dictation errors.     Ottie Glazier, M.D.  Division of [place]

## 2021-08-08 DIAGNOSIS — R1319 Other dysphagia: Secondary | ICD-10-CM | POA: Diagnosis not present

## 2021-08-08 DIAGNOSIS — C760 Malignant neoplasm of head, face and neck: Secondary | ICD-10-CM | POA: Diagnosis not present

## 2021-08-08 DIAGNOSIS — F3160 Bipolar disorder, current episode mixed, unspecified: Secondary | ICD-10-CM | POA: Diagnosis not present

## 2021-08-08 DIAGNOSIS — J9601 Acute respiratory failure with hypoxia: Secondary | ICD-10-CM | POA: Diagnosis not present

## 2021-08-08 DIAGNOSIS — J189 Pneumonia, unspecified organism: Secondary | ICD-10-CM | POA: Diagnosis not present

## 2021-08-08 LAB — BASIC METABOLIC PANEL
Anion gap: 7 (ref 5–15)
BUN: 22 mg/dL (ref 8–23)
CO2: 32 mmol/L (ref 22–32)
Calcium: 8.3 mg/dL — ABNORMAL LOW (ref 8.9–10.3)
Chloride: 96 mmol/L — ABNORMAL LOW (ref 98–111)
Creatinine, Ser: 0.34 mg/dL — ABNORMAL LOW (ref 0.44–1.00)
GFR, Estimated: >60 mL/min (ref >60)
Glucose, Bld: 89 mg/dL (ref 70–99)
Potassium: 3.5 mmol/L (ref 3.5–5.1)
Sodium: 135 mmol/L (ref 135–145)

## 2021-08-08 LAB — CBC
HCT: 28.6 % — ABNORMAL LOW (ref 36.0–46.0)
Hemoglobin: 9.1 g/dL — ABNORMAL LOW (ref 12.0–15.0)
MCH: 29.6 pg (ref 26.0–34.0)
MCHC: 31.8 g/dL (ref 30.0–36.0)
MCV: 93.2 fL (ref 80.0–100.0)
Platelets: 577 10*3/uL — ABNORMAL HIGH (ref 150–400)
RBC: 3.07 MIL/uL — ABNORMAL LOW (ref 3.87–5.11)
RDW: 16.6 % — ABNORMAL HIGH (ref 11.5–15.5)
WBC: 6.9 10*3/uL (ref 4.0–10.5)
nRBC: 0 % (ref 0.0–0.2)

## 2021-08-08 MED ORDER — ENSURE ENLIVE PO LIQD
237.0000 mL | Freq: Three times a day (TID) | ORAL | Status: DC
  Administered 2021-08-08: 14:00:00 237 mL via ORAL

## 2021-08-08 MED ORDER — AMIODARONE HCL 200 MG PO TABS: 200.0000 mg | ORAL_TABLET | Freq: Every day | ORAL | 3 refills | Status: DC

## 2021-08-08 MED ORDER — PREDNISONE 50 MG PO TABS: ORAL_TABLET | ORAL | 0 refills | Status: DC

## 2021-08-08 MED ORDER — HEPARIN SOD (PORK) LOCK FLUSH 100 UNIT/ML IV SOLN
500.0000 [IU] | Freq: Once | INTRAVENOUS | Status: AC
  Administered 2021-08-08: 500 [IU] via INTRAVENOUS
  Filled 2021-08-08: qty 5

## 2021-08-08 MED ORDER — METOPROLOL TARTRATE 25 MG PO TABS: 12.5000 mg | ORAL_TABLET | Freq: Two times a day (BID) | ORAL | 0 refills | Status: DC

## 2021-08-08 NOTE — TOC Progression Note (Signed)
Transition of Care [facility]) - Progression Note    Patient Details  Name: Shannon Schroeder MRN: [id number] Date of Birth: 10/16/1956  [facility] CM/SW Contact  Shelbie Hutching, RN Phone Number: 08/08/2021, 3:59 PM  Clinical Narrative:    3 in 1 ordered from [employer] and will be drop shipped to the patient's home.  Patient is discharging.     Expected Discharge Plan: Home/Self Care Barriers to Discharge: Barriers Resolved  Expected Discharge Plan and Services Expected Discharge Plan: Home/Self Care   Discharge Planning Services: CM Consult   Living arrangements for the past 2 months: Single Family Home Expected Discharge Date: 08/08/21               DME Arranged: Oxygen DME Agency: [employer] Date DME Agency Contacted: 08/07/21 Time DME Agency Contacted: [id number] Representative spoke with at DME Agency: [place]: Patient Refused [place], Refused SNF           Social Determinants of Health (SDOH) Interventions    Readmission Risk Interventions Readmission Risk Prevention Plan 08/01/2021 09/08/2020  Transportation Screening Complete Complete  PCP or Specialist Appt within 3-5 Days Complete Complete  HRI or [place] Complete Complete  Social Work Consult for [place] Planning/Counseling Complete Complete  Palliative Care Screening Complete Not Applicable  Medication Review [employer]) Complete Complete  Some recent data might be hidden

## 2021-08-08 NOTE — Discharge Summary (Signed)
Physician Discharge Summary  Shannon Schroeder XQJ:[id number] DOB: 13-Sep-1956 DOA: 07/30/2021  PCP: Pcp, No  Admit date: 07/30/2021 Discharge date: 08/08/2021  Admitted From: home  Disposition: home  Recommendations for Outpatient Follow-up:  Follow up with PCP in 1-2 weeks F/u w/ pulmon, Dr. Lanney Gins, in 4-5 weeks F/u w/ onco in 1-2 weeks  Home Health: no as pt refused home health  Equipment/Devices:  Discharge Condition: stable  CODE STATUS: full  Diet recommendation: full liquid diet as per speech    Brief/Interim Summary: HPI was taken from Dr. Francine Graven: Shannon Schroeder is a 65 y.o. 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 Patient states that she gets weekly chemotherapy and her last dose was on 07/23/21.  Since that treatment she has had nausea, dry heaves and occasional generalized abdominal pain.  She has also had reduced oral intake.  She has difficulty swallowing and usually is on a pured diet but denies having any recent choking episodes. She has noted some shortness of breath mostly with exertion but denies having any cough, no fever or chills. She denies having any changes in her bowel habits, no urinary symptoms, no palpitations, no diaphoresis, no leg swelling, no dizziness, no lightheadedness, no focal deficits or blurred vision. Labs show sodium 134, potassium 3.2, chloride 93, bicarb 27, glucose 103, BUN 11, creatinine 0.46, calcium 8.5, anion gap 14, magnesium 1.8, alkaline phosphatase 216, albumin 2.6, AST 21, ALT 21, total protein 7.4, troponin 12, lactic acid 0.8, white count 10.2, hemoglobin 10.6, hematocrit 32, MCV 99.7, RDW 16.0, platelet count 512 Respiratory viral panel is negative Chest x-ray reviewed by me shows severe  bronchitis, potentially with developing multilobar bronchopneumonia. Aortic atherosclerosis. CT scan of chest, abdomen and pelvis shows marked worsening multifocal groundglass attenuation and septal thickening in the lungs bilaterally.  Findings are favored to reflect a drug-induced pneumonitis but could alternatively reflect multilobar bilateral pneumonia. No acute findings in the abdomen or pelvis. Twelve-lead EKG reviewed by me shows sinus tachycardia with ST and T wave changes in the lateral leads.       ED Course: Patient is a 65 year old female with a history of head and neck cancer who presents to the ER for evaluation of increased weakness and fatigue as well as poor oral intake, nausea and dry heaves. She is found to have multifocal pneumonia and will be admitted to the hospital for further evaluation.  Hospital course from Dr. Wynelle [place]: Shannon Schroeder a 65 y.o. 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   She was diagnosed with head and neck cancer in 2021 and is currently receiving Keytruda and Cetuximab.    She presents to the hospital for complaints of nausea, vomiting and dry heaves which started after her last dose of cancer treatment on 07/23/2021.  She admits to shortness of breath with exertion. Chest x-ray reveals diffuse interstitial prominence and peribronchial cuffing. CT chest abdomen pelvis with contrast reveals: Multifocal groundglass attenuation and septal thickening in the lungs bilaterally which are worse when compared to PET/CT on 07/08/2021 - No definite signs of metastatic disease in the chest abdomen pelvis  In the ED, she was  started on antibiotics: Vancomycin, azithromycin and cefepime  Hospital course from Dr. Jimmye Norman  10/5-10/7/22: Pt's respiratory status was improving w/ IV solumedrol. IV steroids were switched to po prednisone w/ taper over 5 weeks as per pulmon. Also, pt was unable to weaned from supplemental oxygen so pt was d/c home w/ 2L [place].  PT/OT recs HH but pt refused HH. For more information, please see previous progress/consult notes    Discharge Diagnoses:  Principal Problem:   Acute respiratory failure with hypoxemia (HCC) Active Problems:   Bipolar affective disorder, current episode mixed ([place])   History of atrial fibrillation   Head and neck cancer ([place])   Neoplasm related pain   Pneumonitis   Generalized weakness   Pressure injury of skin   Palliative care encounter  Acute hypoxic respiratory failure: w/ hemoptysis. Etiology unclear, possibly aspiration vs 3 medications that can cause pneumonitis including keytruda, cetuximab and amiodarone. Completed abx course. Monitor I/Os. Continue on steroids as per pulmon. Unable to wean from supplemental oxygen so d/c home w/ 2L [place]   Dysphagia:  continue on full liquid diet as per speech    Normocytic anemia: H&H labile. Will transfuse if Hb < 7.0    Chronic thrombocytosis: labile.   COPD: w/o exacerbation. Continue on bronchodilators    Hypokalemia: resolved    Right sided maxillary cancer, squamous cell invasive cancer: initially presented with a large right facial mass eroding into the maxillary area and multiple branches of the maxillary division of the right trigeminal nerve. S/p multiple cycles of chemotherapy-developed hypersensitivity to carboplatin in 5/22. Management as per onco, Dr. Tasia Catchings. Continue pain control with fentanyl patch, oxycodone, gabapentin   Bipolar affective disorder: current episode mixed. Continue on home dose of trileptal   PAF: continue on BB, eliquis. Continue to hold amiodarone    Hx of brain aneurysm: s/p clipping in 2019  Discharge Instructions  Discharge Instructions     Diet full liquid   Complete by:  As directed    Discharge instructions   Complete by: As directed    F/u w/ PCP in 1-2 weeks. F/u w/ pulmon, Dr. Lanney Gins, in 4-5 weeks. F/u w/ onco in 1-2 weeks   Increase activity slowly   Complete by: As directed    No wound care   Complete by: As directed       Allergies as of 08/08/2021       Reactions   Naproxen Swelling, Rash, Other (See Comments)   Aspirin Swelling, Other (See Comments)   Belladonna Alkaloids Rash   Fluoxetine Hives, Rash   Fluoxetine Hcl Other (See Comments), Rash   Paroxetine Hcl Rash, Other (See Comments)   Phenobarbital Rash, Other (See Comments)   Prozac [fluoxetine Hcl] Rash        Medication List     TAKE these medications    Advair HFA 45-21 MCG/ACT inhaler Generic drug: fluticasone-salmeterol Inhale 2 puffs into the lungs 2 (two) times daily.   albuterol 108 (90 Base) MCG/ACT inhaler Commonly known as: VENTOLIN HFA Inhale 2 puffs into the lungs every 6 (six) hours as needed for wheezing or shortness of breath.   amiodarone 200 MG tablet Commonly known as: PACERONE Take 1 tablet (200 mg total) by mouth daily. Hold this medication until you see your cardiologist and/or pulmonologist What changed: additional instructions   apixaban 5 MG Tabs tablet Commonly known as: ELIQUIS Take 1 tablet (5 mg total) by mouth 2 (two) times daily.   CENTRUM SILVER PO Take 1 tablet  by mouth daily. Gummie   chlorhexidine 0.12 % solution Commonly known as: Peridex Use as directed 15 mLs in the mouth or throat 2 (two) times daily.   docusate sodium 100 MG capsule Commonly known as: Colace Take 1 capsule (100 mg total) by mouth daily.   feeding supplement Liqd Take 237 mLs by mouth 3 (three) times daily between meals.   fentaNYL 75 MCG/HR Commonly known as: [place] 1 patch onto the skin every 3 (three) days.   furosemide 20 MG tablet Commonly known as: LASIX Take 1 tablet (20 mg total) by mouth daily as needed (for lower extremity  swelling).   gabapentin 300 MG capsule Commonly known as: NEURONTIN Take 2 capsules (600 mg total) by mouth 3 (three) times daily.   magnesium chloride 64 MG Tbec SR tablet Commonly known as: SLOW-MAG Take 1 tablet (64 mg total) by mouth 2 (two) times daily.   Melatonin 10 MG Tabs Take 10 mg by mouth at bedtime as needed (sleep).   metoprolol tartrate 25 MG tablet Commonly known as: LOPRESSOR Take 0.5 tablets (12.5 mg total) by mouth 2 (two) times daily.   ondansetron 8 MG disintegrating tablet Commonly known as: ZOFRAN-ODT Take 1 tablet (8 mg total) by mouth every 8 (eight) hours as needed for nausea or vomiting.   oxcarbazepine 600 MG tablet Commonly known as: Trileptal Take 1 tablet (600 mg total) by mouth 2 (two) times daily.   Oxycodone HCl 10 MG Tabs Take 1-2 tablets (10-20 mg total) by mouth every 4 (four) hours as needed.   polyethylene glycol powder 17 GM/SCOOP powder Commonly known as: GLYCOLAX/MIRALAX Take 17 g by mouth daily as needed for mild constipation or moderate constipation.   potassium chloride SA 20 MEQ tablet Commonly known as: KLOR-CON Take 2 tablets (40 mEq total) by mouth daily.   predniSONE 50 MG tablet Commonly known as: DELTASONE 50 mg daily x 7 days, 40 mg daily x 7 days, 30 mg daily x 7 days, 20 mg daily x 7 days, & 10 mg daily x 7 days then stop Start taking on: August 09, 2021   traZODone 50 MG tablet Commonly known as: DESYREL Take 1-2 tablets (50-100 mg total) by mouth at bedtime as needed for sleep.               Durable Medical Equipment  (From admission, onward)           Start     Ordered   08/08/21 1002  For home use only DME 3 n 1  Once        08/08/21 1001   08/07/21 1544  For home use only DME oxygen  Once       Question Answer Comment  Length of Need Lifetime   Mode or (Route) Nasal cannula   Liters per Minute 2   Frequency Continuous (stationary and portable oxygen unit needed)   Oxygen conserving device  Yes   Oxygen delivery system Gas      08/07/21 1543            Follow-up Information     Ottie Glazier, MD Follow up in 4 week(s).   Specialty: Pulmonary Disease Contact information: [place] [address] [phone]         Minna Merritts, MD Follow up in 1 week(s).   Specialty: Cardiology Contact information: [address] [place] [address] [phone]  Allergies  Allergen Reactions   Naproxen Swelling, Rash and Other (See Comments)   Aspirin Swelling and Other (See Comments)   Belladonna Alkaloids Rash   Fluoxetine Hives and Rash   Fluoxetine Hcl Other (See Comments) and Rash   Paroxetine Hcl Rash and Other (See Comments)   Phenobarbital Rash and Other (See Comments)   Prozac [Fluoxetine Hcl] Rash    Consultations: Pulmon  Onco   Procedures/Studies: CT CHEST ABDOMEN PELVIS W CONTRAST  Result Date: 07/30/2021 CLINICAL DATA:  65 year old female with history of multilobar pneumonia on chest x-ray and abdominal pain. 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 Possible sepsis. EXAM: CT CHEST, ABDOMEN, AND PELVIS WITH CONTRAST TECHNIQUE: Multidetector CT imaging of the chest, abdomen and pelvis was performed following the standard protocol during bolus administration of intravenous contrast. CONTRAST:  58mL OMNIPAQUE IOHEXOL 350 MG/ML SOLN COMPARISON:  PET-CT 07/08/2021. FINDINGS: CT CHEST FINDINGS Cardiovascular: Heart size is normal. There is no significant pericardial fluid, thickening or pericardial calcification. Aortic atherosclerosis. No definite coronary artery calcifications. Left internal jugular single-lumen porta cath with tip terminating in the distal superior vena cava. Mediastinum/Nodes: Multiple prominent but nonenlarged mediastinal lymph nodes are nonspecific. No definite pathologic lymphadenopathy identified in mediastinal or hilar nodal stations. Esophagus is unremarkable in  appearance. No axillary lymphadenopathy. Lungs/Pleura: Worsening patchy multifocal areas of ground-glass attenuation, septal thickening and thickening of the peribronchovascular interstitium is seen throughout the lungs bilaterally with no discernible craniocaudal gradient. No confluent consolidative airspace disease. No pleural effusions. No definite suspicious appearing pulmonary nodules or masses are noted. Musculoskeletal: There are no aggressive appearing lytic or blastic lesions noted in the visualized portions of the skeleton. CT ABDOMEN PELVIS FINDINGS Hepatobiliary: No suspicious cystic or solid hepatic lesions. No intra or extrahepatic biliary ductal dilatation. Status post cholecystectomy. Pancreas: No pancreatic mass. No pancreatic ductal dilatation. No pancreatic or peripancreatic fluid collections or inflammatory changes. Spleen: Unremarkable. Adrenals/Urinary Tract: Bilateral kidneys and adrenal glands are normal in appearance. No hydroureteronephrosis. Urinary bladder is normal in appearance. Stomach/Bowel: The appearance of the stomach is normal. There is no pathologic dilatation of small bowel or colon. Normal appendix. Vascular/Lymphatic: Aortic atherosclerosis, without evidence of aneurysm or dissection in the abdominal or pelvic vasculature. No lymphadenopathy noted in the abdomen or pelvis. Reproductive: Status post hysterectomy. Ovaries are not confidently identified may be surgically absent or atrophic. Other: No significant volume of ascites.  No pneumoperitoneum. Musculoskeletal: Chronic compression fracture of anterior aspect of the superior endplate of L1, similar to the prior study from 05/01/2021 with minimal loss of anterior vertebral body height. There are no aggressive appearing lytic or blastic lesions noted in the visualized portions of the skeleton. IMPRESSION: 1. Marked worsening multifocal ground-glass attenuation and septal thickening in the lungs bilaterally when compared to  prior PET-CT 07/08/2021. Findings are favored to reflect a drug-induced pneumonitis, but could alternatively reflect multilobar bilateral pneumonia from atypical infection. Further clinical evaluation is recommended. 2. No acute findings are noted in the abdomen or pelvis. 3. No definite signs of metastatic disease in the chest, abdomen or pelvis. 4. Aortic atherosclerosis. 5. Additional incidental findings, as above. Electronically Signed   By: Vinnie Langton M.D.   On: 07/30/2021 06:53   DG Chest Port 1 View  Result Date: 08/07/2021 CLINICAL DATA:  Evaluate lung fields EXAM: PORTABLE CHEST 1 VIEW COMPARISON:  Chest x-ray dated August 03, 2021. FINDINGS: Cardiac and mediastinal contours are unchanged. Unchanged position of left chest wall port. Unchanged bilateral heterogeneous pulmonary opacities. No large pleural  effusion or evidence of pneumothorax. IMPRESSION: Unchanged bilateral heterogeneous pulmonary opacities. Electronically Signed   By: Yetta Glassman M.D.   On: 08/07/2021 10:58   DG Chest Port 1 View  Result Date: 08/03/2021 CLINICAL DATA:  Pneumonitis.  History of cancer. EXAM: PORTABLE CHEST 1 VIEW COMPARISON:  July 29, 2021 FINDINGS: Diffuse bilateral patchy pulmonary infiltrates. Stable left Port-A-Cath. No pneumothorax. No change in the cardiomediastinal silhouette. IMPRESSION: New diffuse bilateral pulmonary patchy infiltrates. The findings are most consistent with multifocal pneumonia. Edema considered less likely. Electronically Signed   By: Dorise Bullion III M.D.   On: 08/03/2021 08:16   DG Chest Port 1 View  Result Date: 07/29/2021 CLINICAL DATA:  65 year old female with history of cough and weakness for the past several days. EXAM: PORTABLE CHEST 1 VIEW COMPARISON:  Chest x-ray 12/30/2020. FINDINGS: Left-sided internal jugular single-lumen porta cath with tip terminating in the distal superior vena cava. Lung volumes are normal. Diffuse interstitial prominence and  peribronchial cuffing. No confluent consolidative airspace disease. No pleural effusions. No pneumothorax. No pulmonary nodule or mass noted. Pulmonary vasculature and the cardiomediastinal silhouette are within normal limits. Atherosclerotic calcifications in the thoracic aorta. IMPRESSION: 1. The appearance the chest suggests severe bronchitis, potentially with developing multilobar bronchopneumonia. 2. Aortic atherosclerosis. Electronically Signed   By: Vinnie Langton M.D.   On: 07/29/2021 19:39   (Echo, Carotid, EGD, Colonoscopy, ERCP)    Subjective: Pt c/o malaise    Discharge Exam: Vitals:   08/08/21 0738 08/08/21 1136  BP: (!) 112/58 113/60  Pulse: 71 98  Resp: 16 16  Temp: 97.8 F (36.6 C) 98.6 F (37 C)  SpO2: 97% 98%   Vitals:   08/07/21 1618 08/07/21 2213 08/08/21 0738 08/08/21 1136  BP: 118/69 134/75 (!) 112/58 113/60  Pulse: 80 77 71 98  Resp: 18 16 16 16   Temp: 98 F (36.7 C) 97.7 F (36.5 C) 97.8 F (36.6 C) 98.6 F (37 C)  TempSrc:  Oral Oral Axillary  SpO2: 98% 98% 97% 98%  Weight:      Height:        General: Pt is alert, awake, not in acute distress. Appears older than stated age  Cardiovascular: S1/S2 +, no rubs, no gallops Respiratory: CTA bilaterally, no wheezing, no rhonchi Abdominal: Soft, NT, ND, bowel sounds + Extremities: no cyanosis    The results of significant diagnostics from this hospitalization (including imaging, microbiology, ancillary and laboratory) are listed below for reference.     Microbiology: Recent Results (from the past 240 hour(s))  Blood Culture (routine x 2)     Status: None   Collection Time: 07/30/21  5:51 AM   Specimen: BLOOD  Result Value Ref Range Status   Specimen Description BLOOD LEFT HAND  Final   Special Requests   Final    BOTTLES DRAWN AEROBIC AND ANAEROBIC Blood Culture adequate volume   Culture   Final    NO GROWTH 5 DAYS Performed at [facility], [address] [place], [place]  [address]    Report Status 08/04/2021 FINAL  Final  Blood Culture (routine x 2)     Status: None   Collection Time: 07/30/21  5:51 AM   Specimen: BLOOD  Result Value Ref Range Status   Specimen Description BLOOD RIGHT FOREARM  Final   Special Requests   Final    BOTTLES DRAWN AEROBIC AND ANAEROBIC Blood Culture adequate volume   Culture   Final    NO GROWTH 5 DAYS Performed  at [facility], [address], [place], [place] [address]    Report Status 08/04/2021 FINAL  Final  Urine Culture     Status: Abnormal   Collection Time: 07/30/21  5:51 AM   Specimen: Urine, Random  Result Value Ref Range Status   Specimen Description   Final    URINE, RANDOM Performed at [facility], [address], [place], [place] [address]    Special Requests   Final    NONE Performed at [facility], [address] [place], [place] [address]    Culture (A)  Final    <10,000 COLONIES/mL INSIGNIFICANT GROWTH Performed at [facility], [place] [address] [place], [place] [address]    Report Status 07/31/2021 FINAL  Final  Resp Panel by RT-PCR (Flu A&B, Covid) Nasopharyngeal Swab     Status: None   Collection Time: 07/30/21  5:51 AM   Specimen: Nasopharyngeal Swab; Nasopharyngeal(NP) swabs in vial transport medium  Result Value Ref Range Status   SARS Coronavirus 2 by RT PCR NEGATIVE NEGATIVE Final    Comment: (NOTE) SARS-CoV-2 target nucleic acids are NOT DETECTED.  The SARS-CoV-2 RNA is generally detectable in upper respiratory specimens during the acute phase of infection. The lowest concentration of SARS-CoV-2 viral copies this assay can detect is 138 copies/mL. A negative result does not preclude SARS-Cov-2 infection and should not be used as the sole basis for treatment or other patient management decisions. A negative result may occur with  improper specimen collection/handling, submission of specimen other than nasopharyngeal swab, presence of viral  mutation(s) within the areas targeted by this assay, and inadequate number of viral copies(<138 copies/mL). A negative result must be combined with clinical observations, patient history, and epidemiological information. The expected result is Negative.  Fact Sheet for Patients:  EntrepreneurPulse.com.au  Fact Sheet for Healthcare Providers:  IncredibleEmployment.be  This test is no t yet approved or cleared by the [place] FDA and  has been authorized for detection and/or diagnosis of SARS-CoV-2 by FDA under an Emergency Use Authorization (EUA). This EUA will remain  in effect (meaning this test can be used) for the duration of the COVID-19 declaration under Section 564(b)(1) of the Act, 21 U.S.C.section 360bbb-3(b)(1), unless the authorization is terminated  or revoked sooner.       Influenza A by PCR NEGATIVE NEGATIVE Final   Influenza B by PCR NEGATIVE NEGATIVE Final    Comment: (NOTE) The Xpert Xpress SARS-CoV-2/FLU/RSV plus assay is intended as an aid in the diagnosis of influenza from Nasopharyngeal swab specimens and should not be used as a sole basis for treatment. Nasal washings and aspirates are unacceptable for Xpert Xpress SARS-CoV-2/FLU/RSV testing.  Fact Sheet for Patients: EntrepreneurPulse.com.au  Fact Sheet for Healthcare Providers: IncredibleEmployment.be  This test is not yet approved or cleared by the [place] FDA and has been authorized for detection and/or diagnosis of SARS-CoV-2 by FDA under an Emergency Use Authorization (EUA). This EUA will remain in effect (meaning this test can be used) for the duration of the COVID-19 declaration under Section 564(b)(1) of the Act, 21 U.S.C. section 360bbb-3(b)(1), unless the authorization is terminated or revoked.  Performed at [facility], [address] [place], [place] [address]      Labs: BNP (last 3  results) No results for input(s): BNP in the last 8760 hours. Basic Metabolic Panel: Recent Labs  Lab 08/05/21 0549 08/06/21 1125 08/07/21 0420 08/08/21 0408  NA 135 135 137 135  K  4.1 3.4* 3.7 3.5  CL 95* 93* 96* 96*  CO2 32 32 35* 32  GLUCOSE 111* 133* 85 89  BUN 17 29* 27* 22  CREATININE <0.30* 0.38* 0.33* 0.34*  CALCIUM 8.2* 8.6* 8.3* 8.3*   Liver Function Tests: No results for input(s): AST, ALT, ALKPHOS, BILITOT, PROT, ALBUMIN in the last 168 hours. No results for input(s): LIPASE, AMYLASE in the last 168 hours. No results for input(s): AMMONIA in the last 168 hours. CBC: Recent Labs  Lab 08/02/21 1141 08/05/21 0549 08/06/21 1125 08/07/21 0420 08/08/21 0408  WBC 11.2* 8.5 17.1* 7.6 6.9  NEUTROABS 9.8*  --   --   --   --   HGB 8.6* 9.4* 10.1* 8.9* 9.1*  HCT 26.6* 29.1* 32.0* 27.9* 28.6*  MCV 93.7 92.7 92.8 92.7 93.2  PLT 610* 579* 631* 572* 577*   Cardiac Enzymes: No results for input(s): CKTOTAL, CKMB, CKMBINDEX, TROPONINI in the last 168 hours. BNP: Invalid input(s): POCBNP CBG: No results for input(s): GLUCAP in the last 168 hours. D-Dimer No results for input(s): DDIMER in the last 72 hours. Hgb A1c No results for input(s): HGBA1C in the last 72 hours. Lipid Profile No results for input(s): CHOL, HDL, LDLCALC, TRIG, CHOLHDL, LDLDIRECT in the last 72 hours. Thyroid function studies No results for input(s): TSH, T4TOTAL, T3FREE, THYROIDAB in the last 72 hours.  Invalid input(s): FREET3 Anemia work up No results for input(s): VITAMINB12, FOLATE, FERRITIN, TIBC, IRON, RETICCTPCT in the last 72 hours. Urinalysis    Component Value Date/Time   COLORURINE AMBER (A) 07/30/2021 0057   APPEARANCEUR CLOUDY (A) 07/30/2021 0057   APPEARANCEUR Clear 12/21/2018 1041   LABSPEC 1.032 (H) 07/30/2021 0057   LABSPEC 1.017 04/23/2015 0000   PHURINE 6.0 07/30/2021 0057   GLUCOSEU NEGATIVE 07/30/2021 0057   HGBUR NEGATIVE 07/30/2021 0057   BILIRUBINUR SMALL (A)  07/30/2021 0057   BILIRUBINUR Negative 12/21/2018 1041   KETONESUR 20 (A) 07/30/2021 0057   PROTEINUR 100 (A) 07/30/2021 0057   NITRITE NEGATIVE 07/30/2021 0057   LEUKOCYTESUR SMALL (A) 07/30/2021 0057   Sepsis Labs Invalid input(s): PROCALCITONIN,  WBC,  LACTICIDVEN Microbiology Recent Results (from the past 240 hour(s))  Blood Culture (routine x 2)     Status: None   Collection Time: 07/30/21  5:51 AM   Specimen: BLOOD  Result Value Ref Range Status   Specimen Description BLOOD LEFT HAND  Final   Special Requests   Final    BOTTLES DRAWN AEROBIC AND ANAEROBIC Blood Culture adequate volume   Culture   Final    NO GROWTH 5 DAYS Performed at [facility], [address], [place], [place] [address]    Report Status 08/04/2021 FINAL  Final  Blood Culture (routine x 2)     Status: None   Collection Time: 07/30/21  5:51 AM   Specimen: BLOOD  Result Value Ref Range Status   Specimen Description BLOOD RIGHT FOREARM  Final   Special Requests   Final    BOTTLES DRAWN AEROBIC AND ANAEROBIC Blood Culture adequate volume   Culture   Final    NO GROWTH 5 DAYS Performed at [facility], [address], [place], [place] [id number]    Report Status 08/04/2021 FINAL  Final  Urine Culture     Status: Abnormal   Collection Time: 07/30/21  5:51 AM   Specimen: Urine, Random  Result Value Ref Range Status   Specimen Description   Final    URINE, RANDOM Performed at  [facility], [address], [place], [place] [address]    Special Requests   Final    NONE Performed at [facility], [address], [place], [place] [address]    Culture (A)  Final    <10,000 COLONIES/mL INSIGNIFICANT GROWTH Performed at [facility] [address], [place], [place] [address]    Report Status 07/31/2021 FINAL  Final  Resp Panel by RT-PCR (Flu A&B, Covid) Nasopharyngeal Swab     Status: None   Collection Time: 07/30/21  5:51 AM   Specimen: Nasopharyngeal  Swab; Nasopharyngeal(NP) swabs in vial transport medium  Result Value Ref Range Status   SARS Coronavirus 2 by RT PCR NEGATIVE NEGATIVE Final    Comment: (NOTE) SARS-CoV-2 target nucleic acids are NOT DETECTED.  The SARS-CoV-2 RNA is generally detectable in upper respiratory specimens during the acute phase of infection. The lowest concentration of SARS-CoV-2 viral copies this assay can detect is 138 copies/mL. A negative result does not preclude SARS-Cov-2 infection and should not be used as the sole basis for treatment or other patient management decisions. A negative result may occur with  improper specimen collection/handling, submission of specimen other than nasopharyngeal swab, presence of viral mutation(s) within the areas targeted by this assay, and inadequate number of viral copies(<138 copies/mL). A negative result must be combined with clinical observations, patient history, and epidemiological information. The expected result is Negative.  Fact Sheet for Patients:  EntrepreneurPulse.com.au  Fact Sheet for Healthcare Providers:  IncredibleEmployment.be  This test is no t yet approved or cleared by the [place] FDA and  has been authorized for detection and/or diagnosis of SARS-CoV-2 by FDA under an Emergency Use Authorization (EUA). This EUA will remain  in effect (meaning this test can be used) for the duration of the COVID-19 declaration under Section 564(b)(1) of the Act, 21 U.S.C.section 360bbb-3(b)(1), unless the authorization is terminated  or revoked sooner.       Influenza A by PCR NEGATIVE NEGATIVE Final   Influenza B by PCR NEGATIVE NEGATIVE Final    Comment: (NOTE) The Xpert Xpress SARS-CoV-2/FLU/RSV plus assay is intended as an aid in the diagnosis of influenza from Nasopharyngeal swab specimens and should not be used as a sole basis for treatment. Nasal washings and aspirates are unacceptable for Xpert Xpress  SARS-CoV-2/FLU/RSV testing.  Fact Sheet for Patients: EntrepreneurPulse.com.au  Fact Sheet for Healthcare Providers: IncredibleEmployment.be  This test is not yet approved or cleared by the [place] FDA and has been authorized for detection and/or diagnosis of SARS-CoV-2 by FDA under an Emergency Use Authorization (EUA). This EUA will remain in effect (meaning this test can be used) for the duration of the COVID-19 declaration under Section 564(b)(1) of the Act, 21 U.S.C. section 360bbb-3(b)(1), unless the authorization is terminated or revoked.  Performed at [facility], [address], [place],  [address]      Time coordinating discharge: Over 30 minutes  SIGNED:   Wyvonnia Dusky, MD  [facility] 08/08/2021, 12:47 PM Pager   If 7PM-7AM, please contact night-coverage

## 2021-08-08 NOTE — Progress Notes (Signed)
Received MD order to discharge patient to home with home o2, reviewed home meds, prescriptions, discharge instructions and follow up appointments with patient and patient verbalized understanding.

## 2021-08-08 NOTE — TOC Transition Note (Signed)
Transition of Care [facility]) - CM/SW Discharge Note   Patient Details  Name: Shannon Schroeder MRN: [id number] Date of Birth: January 11, 1956  [facility] CM/SW Contact:  Shelbie Hutching, RN Phone Number: 08/08/2021, 12:25 PM   Clinical Narrative:    Patient medically cleared for discharge back home.  Patient has refused home health services, she reports her friend and caregiver will take care of her.  Patient did agree to oxygen.  Oxygen delivered to the room yesterday by [employer].  Friend will transport patient home.   Final next level of care: Home/Self Care Barriers to Discharge: Barriers Resolved   Patient Goals and CMS Choice Patient states their goals for this hospitalization and ongoing recovery are:: Patient wants to go back home      Discharge Placement                       Discharge Plan and Services   Discharge Planning Services: CM Consult            DME Arranged: Oxygen DME Agency: [employer] Date DME Agency Contacted: 08/07/21 Time DME Agency Contacted: [id number] Representative spoke with at DME Agency: [place]: Patient Refused [facility] [place], Refused SNF          Social Determinants of Health (SDOH) Interventions     Readmission Risk Interventions Readmission Risk Prevention Plan 08/01/2021 09/08/2020  Transportation Screening Complete Complete  PCP or Specialist Appt within 3-5 Days Complete Complete  HRI or [place] Complete Complete  Social Work Consult for [place] Planning/Counseling Complete Complete  Palliative Care Screening Complete Not Applicable  Medication Review Press photographer) Complete Complete  Some recent data might be hidden

## 2021-08-08 NOTE — Progress Notes (Signed)
Pulmonary Medicine          Date: 08/08/2021,   MRN# [id number] Shannon Schroeder 04-Jul-1956     AdmissionWeight: 57.3 kg                 CurrentWeight: 57.3 kg   Referring physician: Dr Raul Del    CHIEF COMPLAINT:   Multifocal ground glass infiltrates of bilateral lungs   HISTORY OF PRESENT ILLNESS    This is a 65 y.o. female with medical history significant for AF on chronic anticoagulation therapy, history of head and neck cancer on chemotherapy and immunotherapy, history of hypertension who presents to the emergency room for evaluation of worsening malaise. Currently on chemo per med-onc Dr Tasia Catchings.  Reports NV symptoms post chemo.  She has chronic dysphagia.  Reports over last week worsening dyspnea on exertion . Initial labs were unremarkable except for chronic anemia.  RVP was negative.  CT chest done showing multifocal GGO centrally predominant with generous PA diameter. PCCM consult for further evaluation and treatment.   08/02/21- patient improved clinically.  She reports less dyspnea. She is developing crackles at bases bilaterally, will dc IVF NS 142ml/h patient is euvolemic.  Reducing steroids to 40 bid solumedrol IV.  PT/OT as ordered. CBC w diff repeat today due to hemoptysis with reduced h/h.    08/03/21-  patient is improved, she is participating with PT/OT now doing well with slight and transient desaturation and dyspnea to [facility] level. She had CXR with persistent airspace opacification bilaterally.  Radiography generally lags behind clinical improvement and I suspect this will get better over next 8 wks. Will test for other less likely possibilites via serology today including fungal/PCP.  CBC with diff consistent with anemia acutely with reactive thrombocytosis.  Appreciate collaboration with everyone involved.   08/04/21- patient was unable to ambulate due to severe dyspnea.  Inflammatory biomarkers are elevated and plan is to continue steroids at current dose  40 bid solumedrol.  I have ordered 1x lasix 20 due to crackles at bases posteriorly.  Her spO2 requirement has not worsened but has not improved either in past 24h.     08/05/21- patient improved finally, she is breathing less labored has less crackles at bases.  Diuresed >2L yesterday. Will order one more dose of lasix today. She is unable to perform IS due to altered anatomy of palate and upper airway. Reducing steroids today to once daily from BID.  Repeat CXR in am. Continue PT/OT.    08/06/21- patient is further improved , currently on 1.5L/min [place]. She was able to walk around with PT had very transient mild desaturation only.  She is smiling and happy with progress,  Repeat Blood work and CXR in am orders placed. She is stil with bilateral wheezing but improved, will keep steroids at current dose solumedrol 40 daily IV.  Diuresis DCD patient euvolemic   08/07/21- patient is feeling well, she walked around more today.  She is 99% on 1LNC.  Mild wheezing is appreciated on left improved from previous. Will change steroids to PO pred 50 with tapering protocol.  CXR is not improved.  08/08/21 - patient would be ok for dc home with pred 50 to be tapererd over 5 weeks.  Would reduce prednisone dose by 10mg  per week.  Can follow up with pulmonary [facility] and repeat CXR after this time period.             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    Anxiety    Arthritis    Bipolar disorder (HCC)    COPD (chronic obstructive pulmonary disease) (HCC)    NO INHALERS   Depression    Dysrhythmia    H/O V TACH   Head and neck cancer ([place]) 07/29/2020   Headache    H/O MIGRAINES   Hypertension    Seizures ([place])    X1 AFTER FALL     SURGICAL HISTORY   Past Surgical History:  Procedure Laterality Date   ABDOMINAL HYSTERECTOMY     partial   BREAST SURGERY     biopsy   CARDIAC  CATHETERIZATION     X 2   CARPAL TUNNEL RELEASE Right    CARPECTOMY HAND Right    CATARACT EXTRACTION W/PHACO Left 03/08/2018   Procedure: CATARACT EXTRACTION PHACO AND INTRAOCULAR LENS PLACEMENT ([place]);  Surgeon: Birder Robson, MD;  Location: [facility] ORS;  Service: Ophthalmology;  Laterality: Left;  [place] 00:55 AP% 13.5 CDE 7.52 Fluid pack lot # [id number]   CATARACT EXTRACTION W/PHACO Right 04/05/2018   Procedure: CATARACT EXTRACTION PHACO AND INTRAOCULAR LENS PLACEMENT ([facility]);  Surgeon: Birder Robson, MD;  Location: [facility] ORS;  Service: Ophthalmology;  Laterality: Right;  [place] 00:36 AP% 15.9 CDE 5.72 Fluid pack lot # [id number]   CEREBRAL ANEURYSM REPAIR     CHOLECYSTECTOMY     COLONOSCOPY WITH PROPOFOL N/A 04/26/2019   Procedure: COLONOSCOPY WITH PROPOFOL;  Surgeon: Jonathon Bellows, MD;  Location: [facility] ENDOSCOPY;  Service: Gastroenterology;  Laterality: N/A;   PORTACATH PLACEMENT Left 08/05/2020   Procedure: INSERTION PORT-A-CATH;  Surgeon: Herbert Pun, MD;  Location: [facility] ORS;  Service: General;  Laterality: Left;   TUBAL LIGATION       FAMILY HISTORY   Family History  Problem Relation Age of Onset   Hypertension Mother    Heart failure Mother    Hypertension Brother    Stroke Brother    Hypertension Son    Emphysema Maternal Aunt    Hypertension Paternal Aunt    Hypertension Paternal Uncle    Hypertension Maternal Grandmother    Hypertension Paternal Grandmother      SOCIAL HISTORY   Social History   Tobacco Use   Smoking status: Every Day    Packs/day: 0.25    Years: 50.00    Pack years: 12.50    Types: Cigarettes   Smokeless tobacco: Never  Vaping Use   Vaping Use: Never used  Substance Use Topics   Alcohol use: No   Drug use: No     MEDICATIONS    Home Medication:    Current Medication:  Current Facility-Administered Medications:    acetaminophen (TYLENOL) suppository 650 mg, 650 mg, Rectal, Q4H PRN, Sharion Settler, NP, 650 mg at 07/31/21 2055    apixaban (ELIQUIS) tablet 5 mg, 5 mg, Oral, BID, Agbata, Tochukwu, MD, 5 mg at 08/08/21 0808   Chlorhexidine Gluconate Cloth 2 % PADS 6 each, 6 each, Topical, Daily, Rizwan, Saima, MD, 6 each at 08/08/21 0810   docusate sodium (COLACE) capsule 100 mg, 100 mg, Oral, Daily, Agbata, Tochukwu, MD, 100 mg at 08/08/21 0808   fentaNYL (DURAGESIC) 75 MCG/HR 1 patch, 1 patch, Transdermal, Q72H, Agbata, Tochukwu, MD, 1 patch at 08/05/21 1323   gabapentin (NEURONTIN) capsule 600 mg, 600 mg, Oral, TID, Agbata, Tochukwu, MD, 600 mg at 08/08/21 0807   influenza vaccine adjuvanted (FLUAD) injection 0.5 mL, 0.5  mL, Intramuscular, Tomorrow-1000, Agbata, Tochukwu, MD   ipratropium-albuterol (DUONEB) 0.5-2.5 (3) MG/3ML nebulizer solution 3 mL, 3 mL, Nebulization, Q6H PRN, Agbata, Tochukwu, MD   melatonin tablet 10 mg, 10 mg, Oral, QHS PRN, Agbata, Tochukwu, MD   metoprolol tartrate (LOPRESSOR) tablet 12.5 mg, 12.5 mg, Oral, BID, Lanney Gins, Avey Mcmanamon, MD, 12.5 mg at 08/08/21 0806   mometasone-formoterol (DULERA) 100-5 MCG/ACT inhaler 2 puff, 2 puff, Inhalation, BID, Agbata, Tochukwu, MD, 2 puff at 08/08/21 0802   multivitamin with minerals tablet 1 tablet, 1 tablet, Oral, Daily, Agbata, Tochukwu, MD, 1 tablet at 08/05/21 0811   ondansetron (ZOFRAN) injection 4 mg, 4 mg, Intravenous, Q6H PRN, Debbe Odea, MD, 4 mg at 08/05/21 1448   Oxcarbazepine (TRILEPTAL) tablet 600 mg, 600 mg, Oral, BID, Agbata, Tochukwu, MD, 600 mg at 08/08/21 8413   oxyCODONE (Oxy IR/ROXICODONE) immediate release tablet 10-20 mg, 10-20 mg, Oral, Q4H PRN, Rito Ehrlich A, RPH, 10 mg at 08/04/21 2143   polyethylene glycol (MIRALAX / GLYCOLAX) packet 17 g, 17 g, Oral, Daily PRN, Agbata, Tochukwu, MD   predniSONE (DELTASONE) tablet 50 mg, 50 mg, Oral, Q breakfast, Lanney Gins, Truitt Cruey, MD, 50 mg at 08/08/21 2440   sodium chloride flush (NS) 0.9 % injection 10-40 mL, 10-40 mL, Intracatheter, Q12H, Rizwan, Saima, MD, 10 mL at 08/08/21 0810   sodium chloride  flush (NS) 0.9 % injection 10-40 mL, 10-40 mL, Intracatheter, PRN, Debbe Odea, MD   traZODone (DESYREL) tablet 50 mg, 50 mg, Oral, QHS PRN, Rito Ehrlich A, RPH, 50 mg at 08/07/21 2216  Facility-Administered Medications Ordered in Other Encounters:    heparin lock flush 100 UNIT/ML injection, , , ,    heparin lock flush 100 UNIT/ML injection, , , ,    heparin lock flush 100 unit/mL, 500 Units, Intravenous, Once, Earlie Server, MD   heparin lock flush 100 unit/mL, 500 Units, Intracatheter, Once PRN, Earlie Server, MD   sodium chloride flush (NS) 0.9 % injection 10 mL, 10 mL, Intravenous, PRN, Earlie Server, MD, 10 mL at 10/10/20 1040   sodium chloride flush (NS) 0.9 % injection 10 mL, 10 mL, Intravenous, PRN, Earlie Server, MD, 10 mL at 11/13/20 0836   sodium chloride flush (NS) 0.9 % injection 10 mL, 10 mL, Intravenous, PRN, Earlie Server, MD, 10 mL at 12/30/20 1027   sodium chloride flush (NS) 0.9 % injection 10 mL, 10 mL, Intravenous, PRN, Earlie Server, MD, 10 mL at 01/14/21 2536   sodium chloride flush (NS) 0.9 % injection 10 mL, 10 mL, Intravenous, PRN, Earlie Server, MD, 10 mL at 06/11/21 0815   sodium chloride flush (NS) 0.9 % injection 10 mL, 10 mL, Intravenous, PRN, Earlie Server, MD, 10 mL at 07/09/21 6440    ALLERGIES   Naproxen, Aspirin, Belladonna alkaloids, Fluoxetine, Fluoxetine hcl, Paroxetine hcl, Phenobarbital, and Prozac [fluoxetine hcl]     REVIEW OF SYSTEMS    Review of Systems:  Gen:  Denies  fever, sweats, chills weigh loss  HEENT: Denies blurred vision, double vision, ear pain, eye pain, hearing loss, nose bleeds, sore throat Cardiac:  No dizziness, chest pain or heaviness, chest tightness,edema Resp:   Denies cough or sputum porduction, shortness of breath,wheezing, hemoptysis,  Gi: Denies swallowing difficulty, stomach pain, nausea or vomiting, diarrhea, constipation, bowel incontinence Gu:  Denies bladder incontinence, burning urine Ext:   Denies Joint pain, stiffness or swelling Skin:  Denies  skin rash, easy bruising or bleeding or hives Endoc:  Denies polyuria, polydipsia , polyphagia or weight change Psych:  Denies depression, insomnia or hallucinations   Other:  All other systems negative   VS: BP (!) 112/58 (BP Location: Left Arm)   Pulse 71   Temp 97.8 F (36.6 C) (Oral)   Resp 16   Ht 5\' 6"  (1.676 m)   Wt 57.3 kg   LMP 11/19/1988 (Exact Date)   SpO2 97%   BMI 20.39 kg/m      PHYSICAL EXAM    GENERAL:NAD, no fevers, chills, no weakness no fatigue HEAD: Normocephalic, atraumatic.  EYES: Pupils equal, round, reactive to light. Extraocular muscles intact. No scleral icterus.  MOUTH: Moist mucosal membrane. Dentition intact. No abscess noted.  EAR, NOSE, THROAT: Clear without exudates. No external lesions.  NECK: Supple. No thyromegaly. No nodules. No JVD.  PULMONARY: bilateral mild rhonchi with decreased air entry and mild crackles at bases posteriorly  CARDIOVASCULAR: S1 and S2. Regular rate and rhythm. No murmurs, rubs, or gallops. No edema. Pedal pulses 2+ bilaterally.  GASTROINTESTINAL: Soft, nontender, nondistended. No masses. Positive bowel sounds. No hepatosplenomegaly.  MUSCULOSKELETAL: No swelling, clubbing, or edema. Range of motion full in all extremities.  NEUROLOGIC: Cranial nerves II through XII are intact. No gross focal neurological deficits. Sensation intact. Reflexes intact.  SKIN: No ulceration, lesions, rashes, or cyanosis. Skin warm and dry. Turgor intact.  PSYCHIATRIC: Mood, affect within normal limits. The patient is awake, alert and oriented x 3. Insight, judgment intact.       IMAGING    CT CHEST ABDOMEN PELVIS W CONTRAST  Result Date: 07/30/2021 CLINICAL DATA:  65 year old female with history of multilobar pneumonia on chest x-ray and abdominal pain. 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 Possible sepsis. EXAM: CT CHEST, ABDOMEN, AND PELVIS WITH CONTRAST TECHNIQUE: Multidetector CT imaging of the chest, abdomen and  pelvis was performed following the standard protocol during bolus administration of intravenous contrast. CONTRAST:  17mL OMNIPAQUE IOHEXOL 350 MG/ML SOLN COMPARISON:  PET-CT 07/08/2021. FINDINGS: CT CHEST FINDINGS Cardiovascular: Heart size is normal. There is no significant pericardial fluid, thickening or pericardial calcification. Aortic atherosclerosis. No definite coronary artery calcifications. Left internal jugular single-lumen porta cath with tip terminating in the distal superior vena cava. Mediastinum/Nodes: Multiple prominent but nonenlarged mediastinal lymph nodes are nonspecific. No definite pathologic lymphadenopathy identified in mediastinal or hilar nodal stations. Esophagus is unremarkable in appearance. No axillary lymphadenopathy. Lungs/Pleura: Worsening patchy multifocal areas of ground-glass attenuation, septal thickening and thickening of the peribronchovascular interstitium is seen throughout the lungs bilaterally with no discernible craniocaudal gradient. No confluent consolidative airspace disease. No pleural effusions. No definite suspicious appearing pulmonary nodules or masses are noted. Musculoskeletal: There are no aggressive appearing lytic or blastic lesions noted in the visualized portions of the skeleton. CT ABDOMEN PELVIS FINDINGS Hepatobiliary: No suspicious cystic or solid hepatic lesions. No intra or extrahepatic biliary ductal dilatation. Status post cholecystectomy. Pancreas: No pancreatic mass. No pancreatic ductal dilatation. No pancreatic or peripancreatic fluid collections or inflammatory changes. Spleen: Unremarkable. Adrenals/Urinary Tract: Bilateral kidneys and adrenal glands are normal in appearance. No hydroureteronephrosis. Urinary bladder is normal in appearance. Stomach/Bowel: The appearance of the stomach is normal. There is no pathologic dilatation of small bowel or colon. Normal appendix. Vascular/Lymphatic: Aortic atherosclerosis, without evidence of aneurysm  or dissection in the abdominal or pelvic vasculature. No lymphadenopathy noted in the abdomen or pelvis. Reproductive: Status post hysterectomy. Ovaries are not confidently identified may be surgically absent or atrophic. Other: No significant volume of ascites.  No pneumoperitoneum. Musculoskeletal: Chronic compression fracture of anterior aspect of the  superior endplate of L1, similar to the prior study from 05/01/2021 with minimal loss of anterior vertebral body height. There are no aggressive appearing lytic or blastic lesions noted in the visualized portions of the skeleton. IMPRESSION: 1. Marked worsening multifocal ground-glass attenuation and septal thickening in the lungs bilaterally when compared to prior PET-CT 07/08/2021. Findings are favored to reflect a drug-induced pneumonitis, but could alternatively reflect multilobar bilateral pneumonia from atypical infection. Further clinical evaluation is recommended. 2. No acute findings are noted in the abdomen or pelvis. 3. No definite signs of metastatic disease in the chest, abdomen or pelvis. 4. Aortic atherosclerosis. 5. Additional incidental findings, as above. Electronically Signed   By: Vinnie Langton M.D.   On: 07/30/2021 06:53   DG Chest Port 1 View  Result Date: 08/07/2021 CLINICAL DATA:  Evaluate lung fields EXAM: PORTABLE CHEST 1 VIEW COMPARISON:  Chest x-ray dated August 03, 2021. FINDINGS: Cardiac and mediastinal contours are unchanged. Unchanged position of left chest wall port. Unchanged bilateral heterogeneous pulmonary opacities. No large pleural effusion or evidence of pneumothorax. IMPRESSION: Unchanged bilateral heterogeneous pulmonary opacities. Electronically Signed   By: Yetta Glassman M.D.   On: 08/07/2021 10:58   DG Chest Port 1 View  Result Date: 08/03/2021 CLINICAL DATA:  Pneumonitis.  History of cancer. EXAM: PORTABLE CHEST 1 VIEW COMPARISON:  July 29, 2021 FINDINGS: Diffuse bilateral patchy pulmonary infiltrates.  Stable left Port-A-Cath. No pneumothorax. No change in the cardiomediastinal silhouette. IMPRESSION: New diffuse bilateral pulmonary patchy infiltrates. The findings are most consistent with multifocal pneumonia. Edema considered less likely. Electronically Signed   By: Dorise Bullion III M.D.   On: 08/03/2021 08:16   DG Chest Port 1 View  Result Date: 07/29/2021 CLINICAL DATA:  65 year old female with history of cough and weakness for the past several days. EXAM: PORTABLE CHEST 1 VIEW COMPARISON:  Chest x-ray 12/30/2020. FINDINGS: Left-sided internal jugular single-lumen porta cath with tip terminating in the distal superior vena cava. Lung volumes are normal. Diffuse interstitial prominence and peribronchial cuffing. No confluent consolidative airspace disease. No pleural effusions. No pneumothorax. No pulmonary nodule or mass noted. Pulmonary vasculature and the cardiomediastinal silhouette are within normal limits. Atherosclerotic calcifications in the thoracic aorta. IMPRESSION: 1. The appearance the chest suggests severe bronchitis, potentially with developing multilobar bronchopneumonia. 2. Aortic atherosclerosis. Electronically Signed   By: Vinnie Langton M.D.   On: 07/29/2021 19:39      ASSESSMENT/PLAN   Bilateral multifocal pneumonia -suspect this is due to [place] however patient reports hemoptysis bright red blood and has significant decrement in hemoglobin.  Possible Alevolar hemorrhage in context of pnemonitis with concomitant anticoagulation.  Although she is on IV fluids remainder of cell lines are not proportionally reduced which suggests blood loss.  I agree with steroids.  She is improved already and should be better soon. Agree with empiric abx.  She has received only 2 doses of Keytruda and I would suggest trying to give this again next month after improvement. She has dysphagia and higher risk for aspiration. Noted s/p SLP   -continue Pred 50 changed from  solumedrol 40 mg- CXR interval repeat is not improved -finish empiric CAP regimen -Continue PT/OT -Lasix stopped  Per primary team AF-metoprolol COPD-dulera, she uses advair HFA and Ventolin at home Head/Neck CA- per med onc Dr Mikey College from pulmonary perspective for dc with outpatient follow up   Thank you for allowing me to participate in the care of this patient.   Patient/Family are  satisfied with care plan and all questions have been answered.  This document was prepared using Dragon voice recognition software and may include unintentional dictation errors.     Ottie Glazier, M.D.  Division of [place]

## 2021-08-08 NOTE — Progress Notes (Signed)
Occupational Therapy Treatment Patient Details Name: Shannon Schroeder MRN: [id number] DOB: 1956-07-15 Today's Date: 08/08/2021   History of present illness Pt is a 65 y/o F admitted on 07/30/21 after presenting with c/o worsening malaise. CT chest done showing multifocal GGO centrally predominant with generous PA diameter. Pt is being treated for Bilateral multifocal pneumonia. 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 Pt needing encouragement for participation this session. Pt agreeable to bed level exercises so she can finish coffee. Declined OOB activities. Use of towel pulled tight for B UE strengthening exercises. Pt performed 2 sets of 10 chest presses, bicep curls, forwards rows, backwards rows, and straight arm raises with rest breaks as needed. Pt continues to benefit from OT intervention.    Recommendations for follow up therapy are one component of a multi-disciplinary discharge planning process, led by the attending physician.  Recommendations may be updated based on patient status, additional functional criteria and insurance authorization.    Follow Up Recommendations  Home health OT    Equipment Recommendations  3 in 1 bedside commode       Precautions / Restrictions Precautions Precautions: Fall              ADL either performed or assessed with clinical judgement      Cognition Arousal/Alertness: Awake/alert Behavior During Therapy: WFL for tasks assessed/performed Overall Cognitive Status: Within Functional Limits for tasks assessed                                 General Comments: Pt A and O x 4                   Pertinent Vitals/ Pain       Pain Assessment: No/denies pain         Frequency  Min 1X/week         Progress Toward Goals  OT Goals(current goals can now be found in the care plan section)  Progress towards OT goals: Progressing toward goals  Acute Rehab OT Goals Patient Stated Goal: go home OT Goal Formulation: With patient Time For Goal Achievement: 08/16/21 Potential to Achieve Goals: Good  Plan Discharge plan remains appropriate;Frequency remains appropriate       AM-PAC OT "6 Clicks" Daily Activity     Outcome Measure   Help from another person eating meals?: None Help from another person taking care of personal grooming?: A Little Help from another person toileting, which includes using toliet, bedpan, or urinal?: A Little Help from another person bathing (including washing, rinsing, drying)?: A Little Help from another person to put on and taking off regular upper body clothing?: None Help from another person to put on and taking off regular lower body clothing?: A Little 6 Click Score: 20    End of Session Equipment Utilized During Treatment: Oxygen  OT Visit Diagnosis: Unsteadiness on feet (R26.81);Muscle weakness (generalized) (M62.81)   Activity Tolerance Patient tolerated treatment well   Patient Left in bed;with call bell/phone within reach;with bed alarm set   Nurse Communication Mobility status        Time: 8416-6063 OT Time Calculation (min): 14 min  Charges: OT General Charges $OT Visit: 1 Visit OT Treatments $Therapeutic Exercise: 8-22  mins  Darleen Crocker, MS, OTR/L , CBIS ascom [phone]  08/08/21, 2:18 PM

## 2021-08-08 NOTE — Care Management Important Message (Signed)
Important Message  Patient Details  Name: Shannon Schroeder MRN: [id number] Date of Birth: [birth year]   Medicare Important Message Given:  Yes     Arelly Whittenberg, Leroy Sea 08/08/2021, 1:16 PM

## 2021-08-08 NOTE — Progress Notes (Signed)
Hematology/Oncology Progress Note [facility] [phone] [phone]  Patient Care Team: Pcp, No as PCP - General Rockey Situ Kathlene November, MD as PCP - Cardiology (Cardiology) Earlie Server, MD as Consulting Physician (Oncology)   Name of the patient: Shannon Schroeder  [id number]  May 18, 1956  Date of visit: 08/08/21   INTERVAL HISTORY-  Remains on nasal cannula oxygen, 2L.  No additional hemoptysis episodes.  She feels better today.  IV steroid has been switched to oral   Allergies  Allergen Reactions   Naproxen Swelling, Rash and Other (See Comments)   Aspirin Swelling and Other (See Comments)   Belladonna Alkaloids Rash   Fluoxetine Hives and Rash   Fluoxetine Hcl Other (See Comments) and Rash   Paroxetine Hcl Rash and Other (See Comments)   Phenobarbital Rash and Other (See Comments)   Prozac [Fluoxetine Hcl] Rash      Current Facility-Administered Medications:    acetaminophen (TYLENOL) suppository 650 mg, 650 mg, Rectal, Q4H PRN, Sharion Settler, NP, 650 mg at 07/31/21 2055   apixaban (ELIQUIS) tablet 5 mg, 5 mg, Oral, BID, Agbata, Tochukwu, MD, 5 mg at 08/08/21 0808   Chlorhexidine Gluconate Cloth 2 % PADS 6 each, 6 each, Topical, Daily, Debbe Odea, MD, 6 each at 08/08/21 0810   docusate sodium (COLACE) capsule 100 mg, 100 mg, Oral, Daily, Agbata, Tochukwu, MD, 100 mg at 08/08/21 0808   feeding supplement (ENSURE ENLIVE / ENSURE PLUS) liquid 237 mL, 237 mL, Oral, TID BM, Wyvonnia Dusky, MD, 237 mL at 08/08/21 1339   fentaNYL (DURAGESIC) 75 MCG/HR 1 patch, 1 patch, Transdermal, Q72H, Agbata, Tochukwu, MD, 1 patch at 08/08/21 1311   gabapentin (NEURONTIN) capsule 600 mg, 600 mg, Oral, TID, Agbata, Tochukwu, MD, 600 mg at 08/08/21 0807   influenza vaccine adjuvanted (FLUAD) injection 0.5 mL, 0.5 mL, Intramuscular, Tomorrow-1000, Agbata, Tochukwu, MD   ipratropium-albuterol (DUONEB) 0.5-2.5 (3) MG/3ML nebulizer solution 3 mL, 3 mL,  Nebulization, Q6H PRN, Agbata, Tochukwu, MD   melatonin tablet 10 mg, 10 mg, Oral, QHS PRN, Agbata, Tochukwu, MD   metoprolol tartrate (LOPRESSOR) tablet 12.5 mg, 12.5 mg, Oral, BID, Lanney Gins, Fuad, MD, 12.5 mg at 08/08/21 0806   mometasone-formoterol (DULERA) 100-5 MCG/ACT inhaler 2 puff, 2 puff, Inhalation, BID, Agbata, Tochukwu, MD, 2 puff at 08/08/21 0802   multivitamin with minerals tablet 1 tablet, 1 tablet, Oral, Daily, Agbata, Tochukwu, MD, 1 tablet at 08/05/21 0811   ondansetron (ZOFRAN) injection 4 mg, 4 mg, Intravenous, Q6H PRN, Rizwan, Saima, MD, 4 mg at 08/05/21 1448   Oxcarbazepine (TRILEPTAL) tablet 600 mg, 600 mg, Oral, BID, Agbata, Tochukwu, MD, 600 mg at 08/08/21 0354   oxyCODONE (Oxy IR/ROXICODONE) immediate release tablet 10-20 mg, 10-20 mg, Oral, Q4H PRN, Rito Ehrlich A, RPH, 10 mg at 08/04/21 2143   polyethylene glycol (MIRALAX / GLYCOLAX) packet 17 g, 17 g, Oral, Daily PRN, Agbata, Tochukwu, MD   predniSONE (DELTASONE) tablet 50 mg, 50 mg, Oral, Q breakfast, Aleskerov, Fuad, MD, 50 mg at 08/08/21 0808   sodium chloride flush (NS) 0.9 % injection 10-40 mL, 10-40 mL, Intracatheter, Q12H, Rizwan, Saima, MD, 10 mL at 08/08/21 0810   sodium chloride flush (NS) 0.9 % injection 10-40 mL, 10-40 mL, Intracatheter, PRN, Debbe Odea, MD   traZODone (DESYREL) tablet 50 mg, 50 mg, Oral, QHS PRN, Brantley Stage, Walid A, RPH, 50 mg at 08/07/21 2216  Facility-Administered Medications Ordered in Other Encounters:    heparin lock flush 100 UNIT/ML injection, , , ,  heparin lock flush 100 UNIT/ML injection, , , ,    heparin lock flush 100 unit/mL, 500 Units, Intravenous, Once, Earlie Server, MD   heparin lock flush 100 unit/mL, 500 Units, Intracatheter, Once PRN, Earlie Server, MD   sodium chloride flush (NS) 0.9 % injection 10 mL, 10 mL, Intravenous, PRN, Earlie Server, MD, 10 mL at 10/10/20 1040   sodium chloride flush (NS) 0.9 % injection 10 mL, 10 mL, Intravenous, PRN, Earlie Server, MD, 10 mL at 11/13/20  0836   sodium chloride flush (NS) 0.9 % injection 10 mL, 10 mL, Intravenous, PRN, Earlie Server, MD, 10 mL at 12/30/20 8871   sodium chloride flush (NS) 0.9 % injection 10 mL, 10 mL, Intravenous, PRN, Earlie Server, MD, 10 mL at 01/14/21 0921   sodium chloride flush (NS) 0.9 % injection 10 mL, 10 mL, Intravenous, PRN, Earlie Server, MD, 10 mL at 06/11/21 0815   sodium chloride flush (NS) 0.9 % injection 10 mL, 10 mL, Intravenous, PRN, Earlie Server, MD, 10 mL at 07/09/21 0814   Physical exam:  Vitals:   08/07/21 1618 08/07/21 2213 08/08/21 0738 08/08/21 1136  BP: 118/69 134/75 (!) 112/58 113/60  Pulse: 80 77 71 98  Resp: 18 16 16 16   Temp: 98 F (36.7 C) 97.7 F (36.5 C) 97.8 F (36.6 C) 98.6 F (37 C)  TempSrc:  Oral Oral Axillary  SpO2: 98% 98% 97% 98%  Weight:      Height:       Physical Exam Constitutional:      General: She is not in acute distress.    Appearance: She is not diaphoretic.  HENT:     Head: Normocephalic and atraumatic.     Nose: Nose normal.     Mouth/Throat:     Pharynx: No oropharyngeal exudate.     Comments:  Chronic right side palate deficiency. Right facial swelling Eyes:     General: No scleral icterus.    Pupils: Pupils are equal, round, and reactive to light.  Cardiovascular:     Rate and Rhythm: Normal rate and regular rhythm.     Heart sounds: No murmur heard. Pulmonary:     Effort: Pulmonary effort is normal. No respiratory distress.     Breath sounds: No wheezing or rales.  Abdominal:     General: There is no distension.     Palpations: Abdomen is soft.     Tenderness: There is no abdominal tenderness.  Musculoskeletal:        General: Normal range of motion.     Cervical back: Normal range of motion and neck supple.  Skin:    General: Skin is warm and dry.     Findings: No erythema.  Neurological:     Mental Status: She is alert and oriented to person, place, and time.     Cranial Nerves: No cranial nerve deficit.     Motor: No abnormal muscle  tone.     Coordination: Coordination normal.  Psychiatric:        Mood and Affect: Mood and affect normal.       CMP Latest Ref Rng & Units 08/08/2021  Glucose 70 - 99 mg/dL 89  BUN 8 - 23 mg/dL 22  Creatinine 0.44 - 1.00 mg/dL 0.34(L)  Sodium 135 - 145 mmol/L 135  Potassium 3.5 - 5.1 mmol/L 3.5  Chloride 98 - 111 mmol/L 96(L)  CO2 22 - 32 mmol/L 32  Calcium 8.9 - 10.3 mg/dL 8.3(L)  Total Protein 6.5 -  8.1 g/dL -  Total Bilirubin 0.3 - 1.2 mg/dL -  Alkaline Phos 38 - 126 U/L -  AST 15 - 41 U/L -  ALT 0 - 44 U/L -   CBC Latest Ref Rng & Units 08/08/2021  WBC 4.0 - 10.5 K/uL 6.9  Hemoglobin 12.0 - 15.0 g/dL 9.1(L)  Hematocrit 36.0 - 46.0 % 28.6(L)  Platelets 150 - 400 K/uL 577(H)    RADIOGRAPHIC STUDIES: I have personally reviewed the radiological images as listed and agreed with the findings in the report. CT CHEST ABDOMEN PELVIS W CONTRAST  Result Date: 07/30/2021 CLINICAL DATA:  65 year old female with history of multilobar pneumonia on chest x-ray and abdominal pain. 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 Possible sepsis. EXAM: CT CHEST, ABDOMEN, AND PELVIS WITH CONTRAST TECHNIQUE: Multidetector CT imaging of the chest, abdomen and pelvis was performed following the standard protocol during bolus administration of intravenous contrast. CONTRAST:  22mL OMNIPAQUE IOHEXOL 350 MG/ML SOLN COMPARISON:  PET-CT 07/08/2021. FINDINGS: CT CHEST FINDINGS Cardiovascular: Heart size is normal. There is no significant pericardial fluid, thickening or pericardial calcification. Aortic atherosclerosis. No definite coronary artery calcifications. Left internal jugular single-lumen porta cath with tip terminating in the distal superior vena cava. Mediastinum/Nodes: Multiple prominent but nonenlarged mediastinal lymph nodes are nonspecific. No definite pathologic lymphadenopathy identified in mediastinal or hilar nodal stations. Esophagus is unremarkable in appearance. No axillary  lymphadenopathy. Lungs/Pleura: Worsening patchy multifocal areas of ground-glass attenuation, septal thickening and thickening of the peribronchovascular interstitium is seen throughout the lungs bilaterally with no discernible craniocaudal gradient. No confluent consolidative airspace disease. No pleural effusions. No definite suspicious appearing pulmonary nodules or masses are noted. Musculoskeletal: There are no aggressive appearing lytic or blastic lesions noted in the visualized portions of the skeleton. CT ABDOMEN PELVIS FINDINGS Hepatobiliary: No suspicious cystic or solid hepatic lesions. No intra or extrahepatic biliary ductal dilatation. Status post cholecystectomy. Pancreas: No pancreatic mass. No pancreatic ductal dilatation. No pancreatic or peripancreatic fluid collections or inflammatory changes. Spleen: Unremarkable. Adrenals/Urinary Tract: Bilateral kidneys and adrenal glands are normal in appearance. No hydroureteronephrosis. Urinary bladder is normal in appearance. Stomach/Bowel: The appearance of the stomach is normal. There is no pathologic dilatation of small bowel or colon. Normal appendix. Vascular/Lymphatic: Aortic atherosclerosis, without evidence of aneurysm or dissection in the abdominal or pelvic vasculature. No lymphadenopathy noted in the abdomen or pelvis. Reproductive: Status post hysterectomy. Ovaries are not confidently identified may be surgically absent or atrophic. Other: No significant volume of ascites.  No pneumoperitoneum. Musculoskeletal: Chronic compression fracture of anterior aspect of the superior endplate of L1, similar to the prior study from 05/01/2021 with minimal loss of anterior vertebral body height. There are no aggressive appearing lytic or blastic lesions noted in the visualized portions of the skeleton. IMPRESSION: 1. Marked worsening multifocal ground-glass attenuation and septal thickening in the lungs bilaterally when compared to prior PET-CT 07/08/2021.  Findings are favored to reflect a drug-induced pneumonitis, but could alternatively reflect multilobar bilateral pneumonia from atypical infection. Further clinical evaluation is recommended. 2. No acute findings are noted in the abdomen or pelvis. 3. No definite signs of metastatic disease in the chest, abdomen or pelvis. 4. Aortic atherosclerosis. 5. Additional incidental findings, as above. Electronically Signed   By: Vinnie Langton M.D.   On: 07/30/2021 06:53   DG Chest Port 1 View  Result Date: 08/07/2021 CLINICAL DATA:  Evaluate lung fields EXAM: PORTABLE CHEST 1 VIEW COMPARISON:  Chest x-ray dated August 03, 2021. FINDINGS: Cardiac and mediastinal  contours are unchanged. Unchanged position of left chest wall port. Unchanged bilateral heterogeneous pulmonary opacities. No large pleural effusion or evidence of pneumothorax. IMPRESSION: Unchanged bilateral heterogeneous pulmonary opacities. Electronically Signed   By: Yetta Glassman M.D.   On: 08/07/2021 10:58   DG Chest Port 1 View  Result Date: 08/03/2021 CLINICAL DATA:  Pneumonitis.  History of cancer. EXAM: PORTABLE CHEST 1 VIEW COMPARISON:  July 29, 2021 FINDINGS: Diffuse bilateral patchy pulmonary infiltrates. Stable left Port-A-Cath. No pneumothorax. No change in the cardiomediastinal silhouette. IMPRESSION: New diffuse bilateral pulmonary patchy infiltrates. The findings are most consistent with multifocal pneumonia. Edema considered less likely. Electronically Signed   By: Dorise Bullion III M.D.   On: 08/03/2021 08:16   DG Chest Port 1 View  Result Date: 07/29/2021 CLINICAL DATA:  66 year old female with history of cough and weakness for the past several days. EXAM: PORTABLE CHEST 1 VIEW COMPARISON:  Chest x-ray 12/30/2020. FINDINGS: Left-sided internal jugular single-lumen porta cath with tip terminating in the distal superior vena cava. Lung volumes are normal. Diffuse interstitial prominence and peribronchial cuffing. No  confluent consolidative airspace disease. No pleural effusions. No pneumothorax. No pulmonary nodule or mass noted. Pulmonary vasculature and the cardiomediastinal silhouette are within normal limits. Atherosclerotic calcifications in the thoracic aorta. IMPRESSION: 1. The appearance the chest suggests severe bronchitis, potentially with developing multilobar bronchopneumonia. 2. Aortic atherosclerosis. Electronically Signed   By: Vinnie Langton M.D.   On: 07/29/2021 19:39    Assessment and plan-   #acute respiratory failure.  Multifocal aspiration pneumonia versus drug-induced pneumonitis.  No further hemoptysis episodes. Oral prednisone, recommend slow taper outpatient. Patient needs to follow-up with pulmonology outpatient and also follow-up with me.  Our office will call her for follow-up appointment.  COPD, continue bronchodilator inhalers.  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  Hold treatment due to acute issue. very challenging situation with immunotherapy in the future due to the possibility of immunotherapy induced pneumonitis.  Prognosis is poor Patient will follow-up with me during week of 08/18/2021.  Office will contact clinic.  Neoplasm related pain Continue Fentanyl patch and oxycodone PRN Continue gabapentin and Trileptal.   Palliative care evaluation. OT/PT  Thank you for allowing me to participate in the care of this patient.   Earlie Server, MD, PhD 08/08/2021

## 2021-08-11 ENCOUNTER — Other Ambulatory Visit: Payer: Self-pay | Admitting: Hospice and Palliative Medicine

## 2021-08-11 ENCOUNTER — Encounter: Payer: Self-pay | Admitting: Hospice and Palliative Medicine

## 2021-08-11 DIAGNOSIS — C760 Malignant neoplasm of head, face and neck: Secondary | ICD-10-CM

## 2021-08-11 MED ORDER — PROCHLORPERAZINE MALEATE 10 MG PO TABS: 10.0000 mg | ORAL_TABLET | Freq: Four times a day (QID) | ORAL | 1 refills | Status: AC | PRN

## 2021-08-11 MED ORDER — FENTANYL 75 MCG/HR TD PT72: 1.0000 | MEDICATED_PATCH | TRANSDERMAL | 0 refills | Status: DC

## 2021-08-11 MED ORDER — LIDOCAINE-PRILOCAINE 2.5-2.5 % EX CREA
1.0000 | TOPICAL_CREAM | CUTANEOUS | 2 refills | Status: DC | PRN
Start: 2021-08-11 — End: 2021-11-28

## 2021-08-11 MED ORDER — OXYCODONE HCL 10 MG PO TABS: 10.0000 mg | ORAL_TABLET | ORAL | 0 refills | Status: DC | PRN

## 2021-08-11 MED ORDER — GABAPENTIN 300 MG PO CAPS: 600.0000 mg | ORAL_CAPSULE | Freq: Three times a day (TID) | ORAL | 1 refills | Status: DC

## 2021-08-11 MED ORDER — ONDANSETRON 8 MG PO TBDP: 8.0000 mg | ORAL_TABLET | Freq: Three times a day (TID) | ORAL | 0 refills | Status: AC | PRN

## 2021-08-11 NOTE — Progress Notes (Signed)
Meds refilled per patient request. PDMP reviewed.

## 2021-08-18 NOTE — Progress Notes (Deleted)
Cardiology Office Note:    Date:  08/19/2021   ID:  Shannon Schroeder, DOB [birth year], MRN [id number]  PCP:  Pcp, No  [facility] Cardiologist:  Ida Rogue, MD  [place] Electrophysiologist:  None   Referring MD: No ref. provider found   Chief Complaint: [place] f/u  History of Present Illness:    Shannon Schroeder is a 65 y.o. 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   She was seen virtually by Dr. Fletcher Anon in 03/2019 for preoperative cardiac evaluation for colonoscopy. Echo at that time showed an EF of 60-65%, normal LV diastolic function, normal RVSF with normal ventricular cavity size, PASP 32 mmHg, and mildly to moderately dilated left atrium.   Available stress test from [facility] in 05/2009 showed a small, mildly severe reversible defect involving the mid anteroseptal wall consistent with probable attenuation artifact and/or possible ischemia. EF > 65%.    She was diagnosed with maxillary sinus cancer in 07/2020 and has been undergoing chemoradiation therapy.    She was admitted to the hospital in 08/2020, after being found to have tachycardia at the cancer center and being transferred to the ED, with new onset Afib with RVR of uncertain chronicity. She converted to sinus rhythm on diltiazem gtt and was placed on metoprolol and Eliquis. Echo during the admission showed an EF of 60-65%, no RWMA, mild LVH, Gr2DD, normal PASP, mild MR.   She was readmitted to the hospital on 11/5 after being sent over  from the cancer center with weakness and hypotension. She was noted to be hypotensive in the 23N systolic and be in Afib with RVR upon arrival to the ED. She was admitted with multifocal PNA and treated with broad spectrum antibiotics. She was placed on a Cardizem gtt with improvement in her ventricular rates throughout her admission. Troponin peaked at 27 and was down trending. On the night of 11/8, she developed SVT with rates into the 170s bpm with noted drop in BP to the 36R systolic. With hypotension, she was given an bolus of normal saline along with a bolus of IV amiodarone and placed on an amiodarone gtt. With this, she converted to sinus rhythm, without significant post termination pause. With this, she remained in sinus rhythm and was placed on oral amiodarone in an effort to maintain sinus rhythm and decrease her atrial arrhythmia burden.  Was admitted 9/28-10/7 for weakness and fatigue.  Once her weekly chemotherapy she reported nausea, dry heaves, generalized weakness.  She was treated for respiratory failure, possible aspiration versus pneumonitis.    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    Anxiety    Arthritis    Bipolar disorder (HCC)    COPD (chronic obstructive pulmonary disease) ([place])    NO INHALERS   Depression    Dysrhythmia    H/O V TACH   Head and neck cancer ([place]) 07/29/2020   Headache    H/O MIGRAINES  Hypertension    Seizures ([place])    X1 AFTER FALL    Past Surgical History:  Procedure Laterality Date   ABDOMINAL HYSTERECTOMY     partial   BREAST SURGERY     biopsy   CARDIAC CATHETERIZATION     X 2   CARPAL TUNNEL RELEASE Right    CARPECTOMY HAND Right    CATARACT EXTRACTION W/PHACO Left 03/08/2018   Procedure: CATARACT EXTRACTION PHACO AND INTRAOCULAR LENS PLACEMENT ([place]);  Surgeon: Birder Robson, MD;  Location: [facility] ORS;  Service: Ophthalmology;   Laterality: Left;  [place] 00:55 AP% 13.5 CDE 7.52 Fluid pack lot # [id number]   CATARACT EXTRACTION W/PHACO Right 04/05/2018   Procedure: CATARACT EXTRACTION PHACO AND INTRAOCULAR LENS PLACEMENT (IOC);  Surgeon: Birder Robson, MD;  Location: [facility] ORS;  Service: Ophthalmology;  Laterality: Right;  [place] 00:36 AP% 15.9 CDE 5.72 Fluid pack lot # [id number]   CEREBRAL ANEURYSM REPAIR     CHOLECYSTECTOMY     COLONOSCOPY WITH PROPOFOL N/A 04/26/2019   Procedure: COLONOSCOPY WITH PROPOFOL;  Surgeon: Jonathon Bellows, MD;  Location: [facility] ENDOSCOPY;  Service: Gastroenterology;  Laterality: N/A;   PORTACATH PLACEMENT Left 08/05/2020   Procedure: INSERTION PORT-A-CATH;  Surgeon: Herbert Pun, MD;  Location: [facility] ORS;  Service: General;  Laterality: Left;   TUBAL LIGATION      Current Medications: No outpatient medications have been marked as taking for the 08/19/21 encounter (Appointment) with Kathlen Mody, Jake Fuhrmann H, PA-C.     Allergies:   Naproxen, Aspirin, Belladonna alkaloids, Fluoxetine, Fluoxetine hcl, Paroxetine hcl, Phenobarbital, and Prozac [fluoxetine hcl]   Social History   Socioeconomic History   Marital status: Divorced    Spouse name: Not on file   Number of children: Not on file   Years of education: 16   Highest education level: Bachelor's degree (e.g., BA, AB, BS)  Occupational History   Occupation: farmer  Tobacco Use   Smoking status: Every Day    Packs/day: 0.25    Years: 50.00    Pack years: 12.50    Types: Cigarettes   Smokeless tobacco: Never  Vaping Use   Vaping Use: Never used  Substance and Sexual Activity   Alcohol use: No   Drug use: No   Sexual activity: Not Currently  Other Topics Concern   Not on file  Social History Narrative   Pt lives at [facility].    Social Determinants of Health   Financial Resource Strain: Not on file  Food Insecurity: Not on file  Transportation Needs: Not on file  Physical Activity: Not on file  Stress: Not on file   Social Connections: Not on file     Family History: The patient's family history includes Emphysema in her maternal aunt; Heart failure in her mother; Hypertension in her brother, maternal grandmother, mother, paternal aunt, paternal grandmother, paternal uncle, and son; Stroke in her brother.  ROS:   Please see the history of present illness.     All other systems reviewed and are negative.  EKGs/Labs/Other Studies Reviewed:    The following studies were reviewed today:  2D echo 08/09/2020: 1. Left ventricular ejection fraction, by estimation, is 60 to 65%. The  left ventricle has normal function. The left ventricle has no regional  wall motion abnormalities. There is mild left ventricular hypertrophy.  Left ventricular diastolic parameters  are consistent with Grade II diastolic dysfunction (pseudonormalization).   2. Right ventricular systolic function is normal. The right ventricular  size is normal. There is normal pulmonary  artery systolic pressure. The  estimated right ventricular systolic pressure is 09.8 mmHg.   3. The mitral valve is normal in structure. Mild mitral valve  regurgitation.  __________   2D echo 04/07/2019:  1. The left ventricle has normal systolic function with an ejection  fraction of 60-65%. The cavity size was normal. Left ventricular diastolic  parameters were normal.   2. The right ventricle has normal systolic function. The cavity was  normal. There is no increase in right ventricular wall thickness. Mildly  elevated RVSP, 32 mm Hg   3. Left atrial size was mild-moderately dilated.    EKG:  EKG is *** ordered today.  The ekg ordered today demonstrates ***  Recent Labs: 05/14/2021: TSH 9.516 07/29/2021: ALT 21 07/30/2021: Magnesium 1.8 08/08/2021: BUN 22; Creatinine, Ser 0.34; Hemoglobin 9.1; Platelets 577; Potassium 3.5; Sodium 135  Recent Lipid Panel    Component Value Date/Time   CHOL 129 08/09/2020 0421   CHOL 180 12/21/2018 1041   CHOL  221 04/23/2015 0000   TRIG 87 08/09/2020 0421   TRIG 97 12/18/2016 0000   HDL 52 08/09/2020 0421   HDL 71 12/21/2018 1041   HDL 97 12/18/2016 0000   CHOLHDL 2.5 08/09/2020 0421   VLDL 17 08/09/2020 0421   VLDL 9 04/23/2015 0000   LDLCALC 60 08/09/2020 0421   LDLCALC 92 12/21/2018 1041   LDLCALC 108 04/23/2015 0000     Risk Assessment/Calculations:   {Does this patient have ATRIAL FIBRILLATION?:[id number]}   Physical Exam:    VS:  LMP 11/19/1988 (Exact Date)     Wt Readings from Last 3 Encounters:  07/31/21 126 lb 5.2 oz (57.3 kg)  07/23/21 119 lb 2 oz (54 kg)  07/16/21 120 lb 14.4 oz (54.8 kg)     GEN: *** Well nourished, well developed in no acute distress HEENT: Normal NECK: No JVD; No carotid bruits LYMPHATICS: No lymphadenopathy CARDIAC: ***RRR, no murmurs, rubs, gallops RESPIRATORY:  Clear to auscultation without rales, wheezing or rhonchi  ABDOMEN: Soft, non-tender, non-distended MUSCULOSKELETAL:  No edema; No deformity  SKIN: Warm and dry NEUROLOGIC:  Alert and oriented x 3 PSYCHIATRIC:  Normal affect   ASSESSMENT:    1. PAF (paroxysmal atrial fibrillation) (HCC)   2. Paroxysmal SVT (supraventricular tachycardia) ([place])   3. History of ventricular tachycardia   4. Venous insufficiency   5. Essential hypertension    PLAN:    In order of problems listed above:  Paroxysmal SVT/persistent  Demand ischemia  History of VT  HTN  Lower extremity swelling  Disposition: Follow up {follow up:15908} with ***   Shared Decision Making/Informed Consent   {Are you ordering a CV Procedure (e.g. stress test, cath, DCCV, TEE, etc)?   Press F2        :[id number]}    Signed, Jerrik Housholder Ninfa Meeker, PA-C  08/19/2021 7:49 AM    [facility]

## 2021-08-19 ENCOUNTER — Ambulatory Visit: Payer: Medicare Other | Admitting: Medical

## 2021-08-20 ENCOUNTER — Encounter: Payer: Self-pay | Admitting: Medical

## 2021-08-21 NOTE — Progress Notes (Deleted)
Cardiology Office Note:    Date:  08/21/2021   ID:  Shannon Schroeder, DOB 03-09-1956, MRN [id number]  PCP:  Pcp, No  [facility] Cardiologist:  Ida Rogue, MD  [place] Electrophysiologist:  None   Referring MD: No ref. provider found   Chief Complaint: [place] f/u  History of Present Illness:    Shannon Schroeder is a 65 y.o. 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   She was seen virtually by Dr. Fletcher Anon in 03/2019 for preoperative cardiac evaluation for colonoscopy. Echo at that time showed an EF of 60-65%, normal LV diastolic function, normal RVSF with normal ventricular cavity size, PASP 32 mmHg, and mildly to moderately dilated left atrium.    Available stress test from [facility] in 05/2009 showed a small, mildly severe reversible defect involving the mid anteroseptal wall consistent with probable attenuation artifact and/or possible ischemia. EF > 65%.    She was diagnosed with maxillary sinus cancer in 07/2020 and has been undergoing chemoradiation therapy.    She was admitted to the hospital in 08/2020, after being found to have tachycardia at the cancer center and being transferred to the ED, with new onset Afib with RVR of uncertain chronicity. She converted to sinus rhythm on diltiazem gtt and was placed on metoprolol and Eliquis. Echo during the admission showed an EF of 60-65%, no RWMA, mild LVH, Gr2DD, normal PASP, mild MR. CTA chest was negative for PE. She was noted to have a mild troponin  elevated felt to be related to demand ischemia. No significant coronary artery calcium was noted on CTA of the chest.    She was readmitted to the hospital on 11/5 after being sent over from the cancer center with weakness and hypotension. She was noted to be hypotensive in the 62I systolic and be in Afib with RVR upon arrival to the ED. She was admitted with multifocal PNA and treated with broad spectrum antibiotics. She was placed on a Cardizem gtt with improvement in her ventricular rates throughout her admission. Troponin peaked at 27 and was down trending. On the night of 11/8, she developed SVT with rates into the 170s bpm with noted drop in BP to the 94W systolic. With hypotension, she was given an bolus of normal saline along with a bolus of IV amiodarone and placed on an amiodarone gtt. With this, she converted to sinus rhythm, without significant post termination pause. With this, she remained in sinus rhythm and was placed on oral amiodarone in an effort to maintain sinus rhythm and decrease her atrial arrhythmia burden.  Seen 07/2021 and was overall doing well with rare episodes of tachycardia.   Admitted 9/28-10/7 for weakness and fatigue. She was treated for pneumonitis vs aspiration pna.Also on amiodarone, this was held. She saw palliative care during admission.  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    Anxiety    Arthritis    Bipolar disorder ([place])  COPD (chronic obstructive pulmonary disease) (HCC)    NO INHALERS   Depression    Dysrhythmia    H/O V TACH   Head and neck cancer ([place]) 07/29/2020   Headache    H/O MIGRAINES   Hypertension    Seizures ([place])    X1 AFTER FALL    Past Surgical History:  Procedure Laterality Date   ABDOMINAL HYSTERECTOMY     partial   BREAST SURGERY     biopsy   CARDIAC CATHETERIZATION     X 2   CARPAL TUNNEL RELEASE Right    CARPECTOMY  HAND Right    CATARACT EXTRACTION W/PHACO Left 03/08/2018   Procedure: CATARACT EXTRACTION PHACO AND INTRAOCULAR LENS PLACEMENT ([place]);  Surgeon: Birder Robson, MD;  Location: [facility] ORS;  Service: Ophthalmology;  Laterality: Left;  [place] 00:55 AP% 13.5 CDE 7.52 Fluid pack lot # [id number]   CATARACT EXTRACTION W/PHACO Right 04/05/2018   Procedure: CATARACT EXTRACTION PHACO AND INTRAOCULAR LENS PLACEMENT (IOC);  Surgeon: Birder Robson, MD;  Location: [facility] ORS;  Service: Ophthalmology;  Laterality: Right;  [place] 00:36 AP% 15.9 CDE 5.72 Fluid pack lot # [id number]   CEREBRAL ANEURYSM REPAIR     CHOLECYSTECTOMY     COLONOSCOPY WITH PROPOFOL N/A 04/26/2019   Procedure: COLONOSCOPY WITH PROPOFOL;  Surgeon: Jonathon Bellows, MD;  Location: [facility] ENDOSCOPY;  Service: Gastroenterology;  Laterality: N/A;   PORTACATH PLACEMENT Left 08/05/2020   Procedure: INSERTION PORT-A-CATH;  Surgeon: Herbert Pun, MD;  Location: [facility] ORS;  Service: General;  Laterality: Left;   TUBAL LIGATION      Current Medications: No outpatient medications have been marked as taking for the 08/22/21 encounter (Appointment) with Kathlen Mody, Casey Fye H, PA-C.     Allergies:   Naproxen, Aspirin, Belladonna alkaloids, Fluoxetine, Fluoxetine hcl, Paroxetine hcl, Phenobarbital, and Prozac [fluoxetine hcl]   Social History   Socioeconomic History   Marital status: Divorced    Spouse name: Not on file   Number of children: Not on file   Years of education: 16   Highest education level: Bachelor's degree (e.g., BA, AB, BS)  Occupational History   Occupation: farmer  Tobacco Use   Smoking status: Every Day    Packs/day: 0.25    Years: 50.00    Pack years: 12.50    Types: Cigarettes   Smokeless tobacco: Never  Vaping Use   Vaping Use: Never used  Substance and Sexual Activity   Alcohol use: No   Drug use: No   Sexual activity: Not Currently  Other Topics Concern   Not on file  Social History Narrative   Pt lives at  [facility].    Social Determinants of Health   Financial Resource Strain: Not on file  Food Insecurity: Not on file  Transportation Needs: Not on file  Physical Activity: Not on file  Stress: Not on file  Social Connections: Not on file     Family History: The patient's ***family history includes Emphysema in her maternal aunt; Heart failure in her mother; Hypertension in her brother, maternal grandmother, mother, paternal aunt, paternal grandmother, paternal uncle, and son; Stroke in her brother.  ROS:   Please see the history of present illness.    *** All other systems reviewed and are negative.  EKGs/Labs/Other Studies Reviewed:    The following studies were reviewed today:  Echo 2021  1. Left ventricular ejection fraction, by estimation, is 60 to 65%. The  left ventricle has normal function. The left ventricle has no regional  wall  motion abnormalities. There is mild left ventricular hypertrophy.  Left ventricular diastolic parameters  are consistent with Grade II diastolic dysfunction (pseudonormalization).   2. Right ventricular systolic function is normal. The right ventricular  size is normal. There is normal pulmonary artery systolic pressure. The  estimated right ventricular systolic pressure is 58.8 mmHg.   3. The mitral valve is normal in structure. Mild mitral valve  regurgitation.   EKG:  EKG is *** ordered today.  The ekg ordered today demonstrates ***  Recent Labs: 05/14/2021: TSH 9.516 07/29/2021: ALT 21 07/30/2021: Magnesium 1.8 08/08/2021: BUN 22; Creatinine, Ser 0.34; Hemoglobin 9.1; Platelets 577; Potassium 3.5; Sodium 135  Recent Lipid Panel    Component Value Date/Time   CHOL 129 08/09/2020 0421   CHOL 180 12/21/2018 1041   CHOL 221 04/23/2015 0000   TRIG 87 08/09/2020 0421   TRIG 97 12/18/2016 0000   HDL 52 08/09/2020 0421   HDL 71 12/21/2018 1041   HDL 97 12/18/2016 0000   CHOLHDL 2.5 08/09/2020 0421   VLDL 17 08/09/2020 0421   VLDL  9 04/23/2015 0000   LDLCALC 60 08/09/2020 0421   LDLCALC 92 12/21/2018 1041   LDLCALC 108 04/23/2015 0000     Risk Assessment/Calculations:   {Does this patient have ATRIAL FIBRILLATION?:[id number]}   Physical Exam:    VS:  LMP 11/19/1988 (Exact Date)     Wt Readings from Last 3 Encounters:  07/31/21 126 lb 5.2 oz (57.3 kg)  07/23/21 119 lb 2 oz (54 kg)  07/16/21 120 lb 14.4 oz (54.8 kg)     GEN: *** Well nourished, well developed in no acute distress HEENT: Normal NECK: No JVD; No carotid bruits LYMPHATICS: No lymphadenopathy CARDIAC: ***RRR, no murmurs, rubs, gallops RESPIRATORY:  Clear to auscultation without rales, wheezing or rhonchi  ABDOMEN: Soft, non-tender, non-distended MUSCULOSKELETAL:  No edema; No deformity  SKIN: Warm and dry NEUROLOGIC:  Alert and oriented x 3 PSYCHIATRIC:  Normal affect   ASSESSMENT:    No diagnosis found. PLAN:    In order of problems listed above:  PAF  Respiratory faiure  COPD  Maxillary cancer  Disposition: Follow up {follow up:15908} with ***   Shared Decision Making/Informed Consent   {Are you ordering a CV Procedure (e.g. stress test, cath, DCCV, TEE, etc)?   Press F2        :[id number]}    Signed, Cha Gomillion Ninfa Meeker, PA-C  08/21/2021 1:39 PM    [facility]

## 2021-08-22 ENCOUNTER — Ambulatory Visit: Payer: Medicare Other | Admitting: Medical

## 2021-08-22 ENCOUNTER — Inpatient Hospital Stay: Payer: Medicare Other | Admitting: Oncology

## 2021-08-22 ENCOUNTER — Encounter: Payer: Self-pay | Admitting: Oncology

## 2021-08-22 ENCOUNTER — Inpatient Hospital Stay: Payer: Medicare Other | Admitting: Hospice and Palliative Medicine

## 2021-08-22 ENCOUNTER — Encounter: Payer: Self-pay | Admitting: Hospice and Palliative Medicine

## 2021-09-01 ENCOUNTER — Encounter: Payer: Self-pay | Admitting: Hospice and Palliative Medicine

## 2021-09-01 ENCOUNTER — Other Ambulatory Visit: Payer: Self-pay

## 2021-09-01 NOTE — Telephone Encounter (Signed)
Hi [name] do you know of any patient assistance that pt may be eligible for?

## 2021-09-02 ENCOUNTER — Encounter: Payer: Self-pay | Admitting: Oncology

## 2021-09-02 MED ORDER — FENTANYL 75 MCG/HR TD PT72: 1.0000 | MEDICATED_PATCH | TRANSDERMAL | 0 refills | Status: DC

## 2021-09-02 MED ORDER — OXYCODONE HCL 10 MG PO TABS: 10.0000 mg | ORAL_TABLET | ORAL | 0 refills | Status: DC | PRN

## 2021-09-08 ENCOUNTER — Other Ambulatory Visit: Payer: Self-pay

## 2021-09-08 MED ORDER — METOPROLOL TARTRATE 25 MG PO TABS: 12.5000 mg | ORAL_TABLET | Freq: Two times a day (BID) | ORAL | 0 refills | Status: DC

## 2021-09-09 ENCOUNTER — Inpatient Hospital Stay: Payer: Medicare Other | Admitting: Oncology

## 2021-09-09 ENCOUNTER — Inpatient Hospital Stay: Payer: Medicare Other | Admitting: Hospice and Palliative Medicine

## 2021-09-11 ENCOUNTER — Encounter: Payer: Self-pay | Admitting: Medical

## 2021-09-11 ENCOUNTER — Other Ambulatory Visit: Payer: Self-pay

## 2021-09-11 ENCOUNTER — Emergency Department
Admission: EM | Admit: 2021-09-11 | Discharge: 2021-09-11 | Disposition: A | Discharge status: Home / Self Care | Payer: Medicare Other | Attending: Emergency Medicine | Admitting: Emergency Medicine

## 2021-09-11 ENCOUNTER — Other Ambulatory Visit: Payer: Self-pay | Admitting: Oncology

## 2021-09-11 ENCOUNTER — Ambulatory Visit (INDEPENDENT_AMBULATORY_CARE_PROVIDER_SITE_OTHER): Payer: Medicare Other | Admitting: Medical

## 2021-09-11 ENCOUNTER — Emergency Department: Payer: Medicare Other

## 2021-09-11 VITALS — BP 129/84 | HR 104 | Ht 66.0 in | Wt 109.0 lb

## 2021-09-11 DIAGNOSIS — F17210 Nicotine dependence, cigarettes, uncomplicated: Secondary | ICD-10-CM | POA: Insufficient documentation

## 2021-09-11 DIAGNOSIS — I480 Paroxysmal atrial fibrillation: Secondary | ICD-10-CM

## 2021-09-11 DIAGNOSIS — J9690 Respiratory failure, unspecified, unspecified whether with hypoxia or hypercapnia: Secondary | ICD-10-CM

## 2021-09-11 DIAGNOSIS — R531 Weakness: Secondary | ICD-10-CM | POA: Insufficient documentation

## 2021-09-11 DIAGNOSIS — J449 Chronic obstructive pulmonary disease, unspecified: Secondary | ICD-10-CM | POA: Insufficient documentation

## 2021-09-11 DIAGNOSIS — I4891 Unspecified atrial fibrillation: Secondary | ICD-10-CM | POA: Diagnosis not present

## 2021-09-11 DIAGNOSIS — Z7901 Long term (current) use of anticoagulants: Secondary | ICD-10-CM | POA: Insufficient documentation

## 2021-09-11 DIAGNOSIS — R55 Syncope and collapse: Secondary | ICD-10-CM

## 2021-09-11 DIAGNOSIS — R0602 Shortness of breath: Secondary | ICD-10-CM | POA: Diagnosis not present

## 2021-09-11 DIAGNOSIS — I10 Essential (primary) hypertension: Secondary | ICD-10-CM | POA: Insufficient documentation

## 2021-09-11 DIAGNOSIS — Z7951 Long term (current) use of inhaled steroids: Secondary | ICD-10-CM | POA: Diagnosis not present

## 2021-09-11 DIAGNOSIS — R110 Nausea: Secondary | ICD-10-CM

## 2021-09-11 DIAGNOSIS — Z79899 Other long term (current) drug therapy: Secondary | ICD-10-CM | POA: Diagnosis not present

## 2021-09-11 DIAGNOSIS — I951 Orthostatic hypotension: Secondary | ICD-10-CM

## 2021-09-11 LAB — PROCALCITONIN: Procalcitonin: <0.1 ng/mL

## 2021-09-11 LAB — BASIC METABOLIC PANEL
Anion gap: 10 (ref 5–15)
BUN: 9 mg/dL (ref 8–23)
CO2: 28 mmol/L (ref 22–32)
Calcium: 9.1 mg/dL (ref 8.9–10.3)
Chloride: 96 mmol/L — ABNORMAL LOW (ref 98–111)
Creatinine, Ser: 0.44 mg/dL (ref 0.44–1.00)
GFR, Estimated: >60 mL/min (ref >60)
Glucose, Bld: 98 mg/dL (ref 70–99)
Potassium: 3.5 mmol/L (ref 3.5–5.1)
Sodium: 134 mmol/L — ABNORMAL LOW (ref 135–145)

## 2021-09-11 LAB — TROPONIN I (HIGH SENSITIVITY)
Troponin I (High Sensitivity): 14 ng/L (ref <18)
Troponin I (High Sensitivity): 13 ng/L (ref <18)

## 2021-09-11 LAB — CBC
HCT: 38.1 % (ref 36.0–46.0)
Hemoglobin: 12.4 g/dL (ref 12.0–15.0)
MCH: 30 pg (ref 26.0–34.0)
MCHC: 32.5 g/dL (ref 30.0–36.0)
MCV: 92.3 fL (ref 80.0–100.0)
Platelets: 427 10*3/uL — ABNORMAL HIGH (ref 150–400)
RBC: 4.13 MIL/uL (ref 3.87–5.11)
RDW: 15.2 % (ref 11.5–15.5)
WBC: 8.8 10*3/uL (ref 4.0–10.5)
nRBC: 0 % (ref 0.0–0.2)

## 2021-09-11 IMAGING — CR DG CHEST 2V
1 series · 2 of 2 positions shown · non-contrast
Comparison: [DATE], CT [DATE]

CLINICAL DATA: Chest pain

EXAM:
CHEST - 2 VIEW

[Series 1: dg chest 2 view · 0.14mm/px · 2 of 2 slices shown]
[im 1/2]
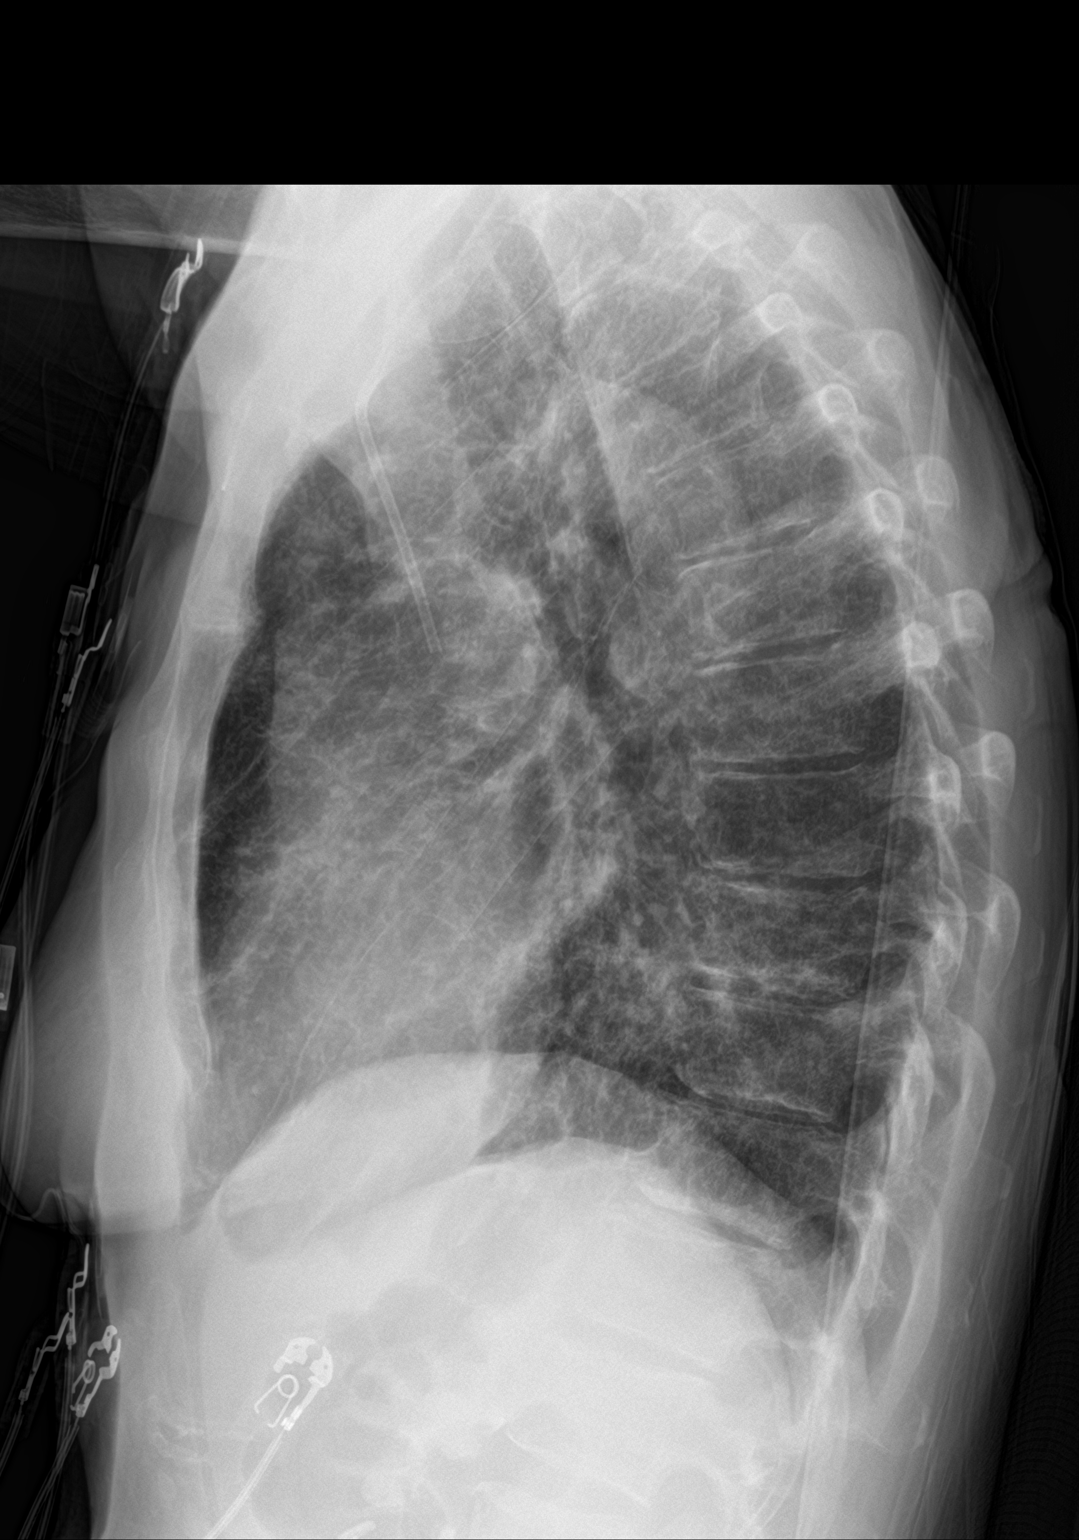
[im 2/2]
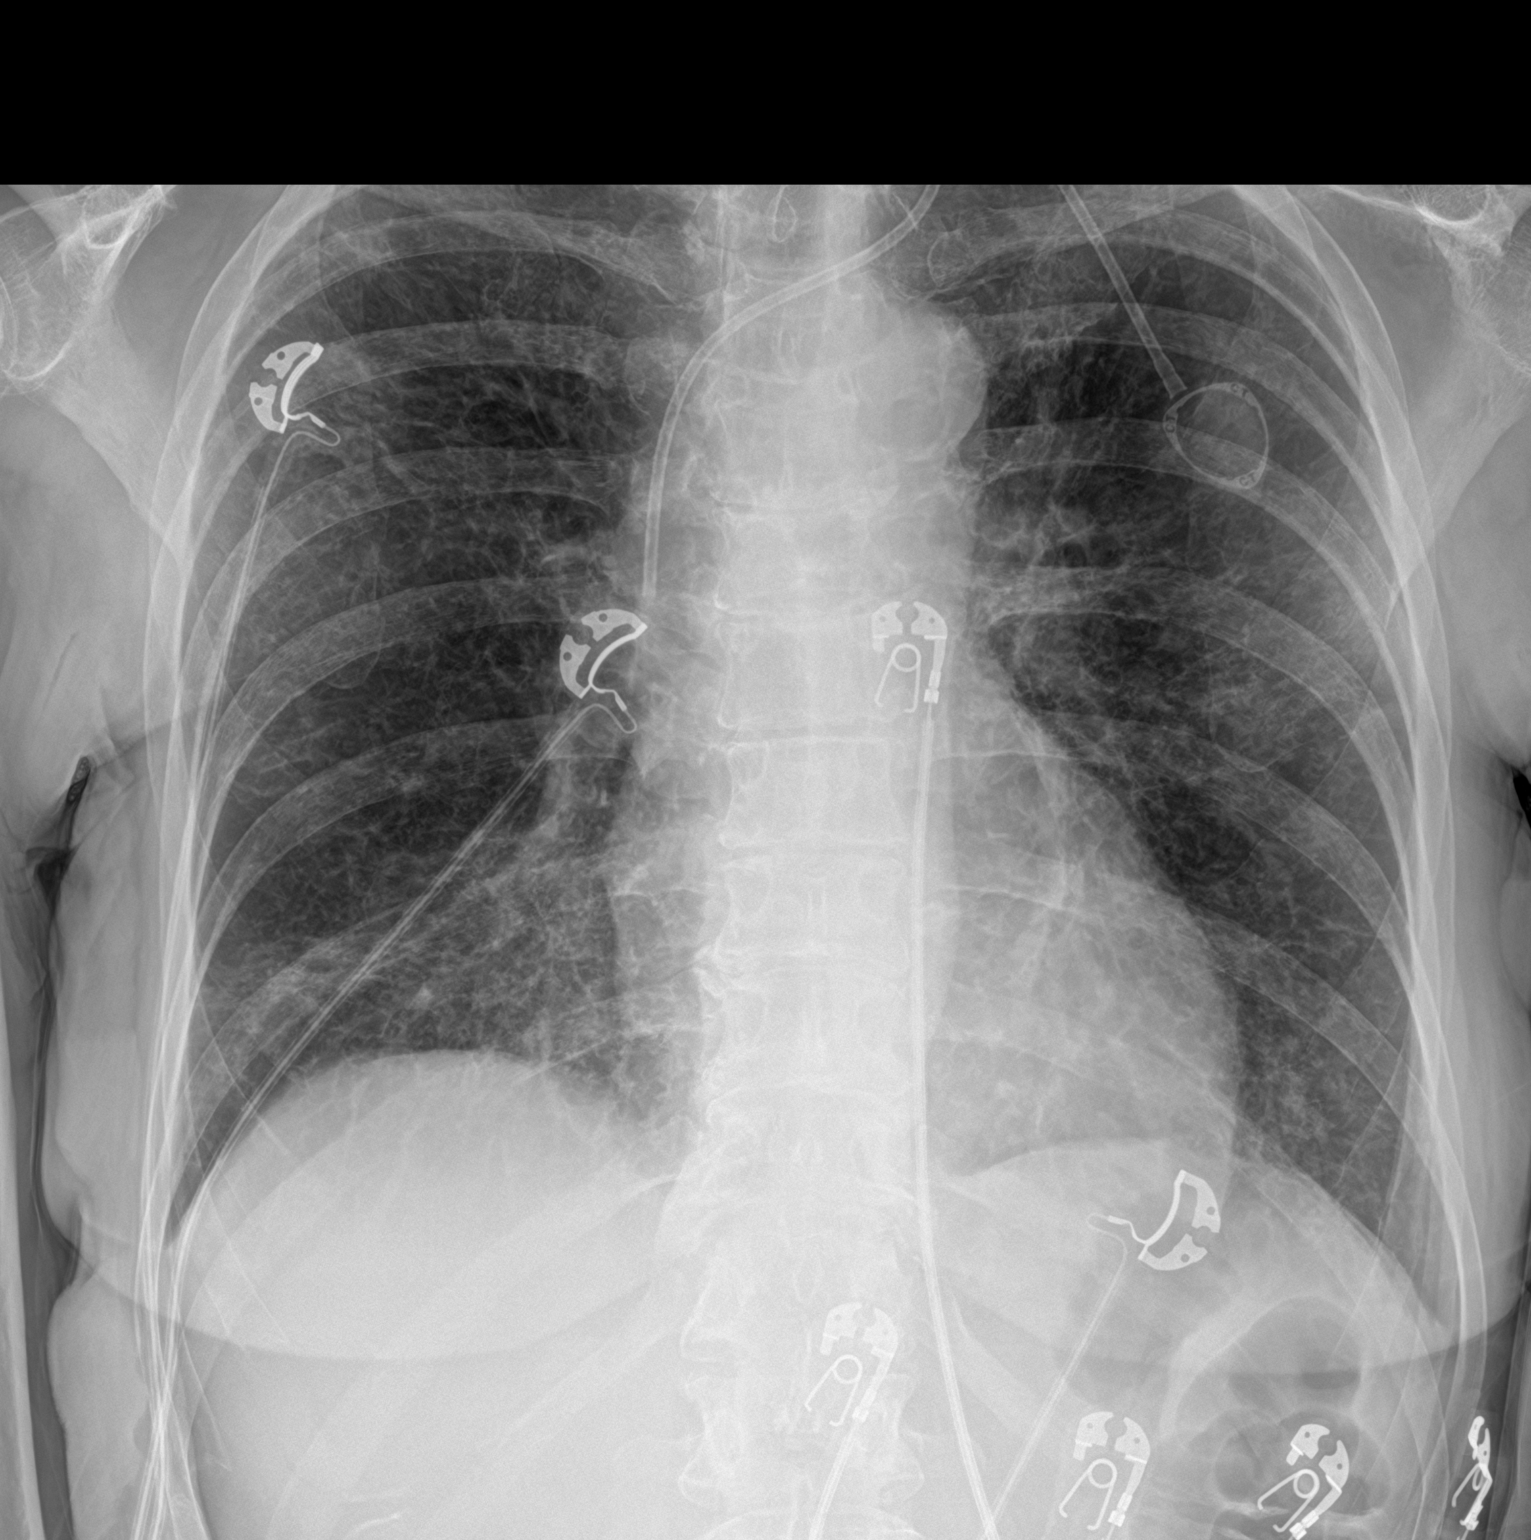

[2 of 2 positions shown; findings below may reference images not displayed]

FINDINGS: Left-sided central venous port tip over the SVC. Improved aeration
since [DATE] with nearly resolved bilateral airspace opacities.
Patchy residual opacities are noted and may reflect scarring or
residual airspace disease. Underlying diffuse interstitial opacity
which may be due to chronic change. Stable cardiomediastinal
silhouette with aortic atherosclerosis. No pneumothorax.
IMPRESSION: Patchy bilateral pulmonary opacities, significantly improved since
[DATE]. No new abnormality is evident.

## 2021-09-11 MED ORDER — SODIUM CHLORIDE 0.9 % IV BOLUS
500.0000 mL | Freq: Once | INTRAVENOUS | Status: AC
  Administered 2021-09-11: 500 mL via INTRAVENOUS

## 2021-09-11 MED ORDER — IOHEXOL 350 MG/ML SOLN
75.0000 mL | Freq: Once | INTRAVENOUS | Status: AC | PRN
  Administered 2021-09-11: 75 mL via INTRAVENOUS

## 2021-09-11 NOTE — Progress Notes (Signed)
Cardiology Office Note:    Date:  09/11/2021   ID:  Shannon Schroeder, DOB 1955-12-31, MRN [id number]  PCP:  Pcp, No  [facility] Cardiologist:  Ida Rogue, MD  [facility] Electrophysiologist:  None   Referring MD: No ref. provider found   Chief Complaint: hospital follow-up  History of Present Illness:    Shannon Schroeder is a 65 y.o. 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   She was seen virtually by Dr. Fletcher Anon in 03/2019 for preoperative cardiac evaluation for colonoscopy. Echo at that time showed an EF of 60-65%, normal LV diastolic function, normal RVSF with normal ventricular cavity size, PASP 32 mmHg, and mildly to moderately dilated left atrium.    Available stress test from [facility] in 05/2009 showed a small, mildly severe reversible defect involving the mid anteroseptal wall consistent with probable attenuation artifact and/or possible ischemia. EF > 65%.    She was diagnosed with maxillary sinus cancer in 07/2020 and has been undergoing chemoradiation therapy.    She was admitted to the hospital in 08/2020, after being found to have tachycardia at the cancer center and being transferred to the ED, with new onset Afib with RVR of uncertain chronicity. She converted to sinus rhythm on diltiazem gtt and was placed on metoprolol and Eliquis. Echo during the admission showed an EF of 60-65%, no RWMA, mild LVH, Gr2DD, normal PASP, mild MR. CTA chest was negative for PE. She was noted to have a mild troponin elevated  felt to be related to demand ischemia. No significant coronary artery calcium was noted on CTA of the chest.    She was readmitted to the hospital on 09/06/20 after being sent over from the cancer center with weakness and hypotension. She was noted to be hypotensive in the 73Z systolic and be in Afib with RVR upon arrival to the ED. She was admitted with multifocal PNA and treated with broad spectrum antibiotics. She was placed on a Cardizem gtt with improvement in her ventricular rates throughout her admission. Troponin peaked at 27 and was down trending. On the night of 11/8, she developed SVT with rates into the 170s bpm with noted drop in BP to the 32D systolic. With hypotension, she was given an bolus of normal saline along with a bolus of IV amiodarone and placed on an amiodarone gtt. With this, she converted to sinus rhythm, without significant post termination pause. With this, she remained in sinus rhythm and was placed on oral amiodarone in an effort to maintain sinus rhythm and decrease her atrial arrhythmia burden.  Admitted 9/28-10/7 for weakness and fatigue. Since stopping chemo she had nausea and dryheaves. Imagine showed PNA and was treated with abx. Possibly pneumonitis from 3 different meds, amiodarone was one of them, so this was held. She was discharged on 2L O2.   Today, patient reports has not been doing well since discharge. Has had multiple attacks of afib, says heart rate over 200bpm. She is no longer on chemotherapy. She completed treatment for PNA. Has diarrhea. No nasuea or vomiting. She does  not have PCP. She fell 2 days ago, everything went black. No pro-dromal symptoms. No injuries. Amiodarone was held due to possible lung toxicity at discharge. EKG today shows ST.    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    Anxiety    Arthritis    Bipolar disorder (HCC)    COPD (chronic obstructive  pulmonary disease) (HCC)    NO INHALERS   Depression    Dysrhythmia    H/O V TACH   Head and neck cancer ([place]) 07/29/2020   Headache    H/O MIGRAINES   Hypertension    Seizures ([place])    X1 AFTER FALL    Past Surgical History:  Procedure Laterality Date   ABDOMINAL HYSTERECTOMY     partial   BREAST SURGERY     biopsy   CARDIAC CATHETERIZATION     X 2   CARPAL TUNNEL RELEASE Right    CARPECTOMY HAND Right    CATARACT EXTRACTION W/PHACO Left 03/08/2018   Procedure: CATARACT EXTRACTION PHACO AND INTRAOCULAR LENS PLACEMENT ([place]);  Surgeon: Birder Robson, MD;  Location: [facility] ORS;  Service: Ophthalmology;  Laterality: Left;  [place] 00:55 AP% 13.5 CDE 7.52 Fluid pack lot # [id number]   CATARACT EXTRACTION W/PHACO Right 04/05/2018   Procedure: CATARACT EXTRACTION PHACO AND INTRAOCULAR LENS PLACEMENT (IOC);  Surgeon: Birder Robson, MD;  Location: [facility] ORS;  Service: Ophthalmology;  Laterality: Right;  [place] 00:36 AP% 15.9 CDE 5.72 Fluid pack lot # [id number]   CEREBRAL ANEURYSM REPAIR     CHOLECYSTECTOMY     COLONOSCOPY WITH PROPOFOL N/A 04/26/2019   Procedure: COLONOSCOPY WITH PROPOFOL;  Surgeon: Jonathon Bellows, MD;  Location: [facility] ENDOSCOPY;  Service: Gastroenterology;  Laterality: N/A;   PORTACATH PLACEMENT Left 08/05/2020   Procedure: INSERTION PORT-A-CATH;  Surgeon: Herbert Pun, MD;  Location: [facility] ORS;  Service: General;  Laterality: Left;   TUBAL LIGATION      Current Medications: No outpatient medications have been marked as taking for the 09/11/21 encounter (Office Visit) with Kathlen Mody, Yvon Mccord H, PA-C.     Allergies:   Naproxen, Aspirin, Belladonna alkaloids, Fluoxetine, Fluoxetine hcl, Paroxetine hcl, Phenobarbital, and Prozac [fluoxetine hcl]   Social History   Socioeconomic History   Marital status: Divorced    Spouse name: Not on file   Number of children: Not on file   Years of education: 16   Highest education level: Bachelor's degree (e.g., BA, AB, BS)   Occupational History   Occupation: farmer  Tobacco Use   Smoking status: Every Day    Packs/day: 0.25    Years: 50.00    Pack years: 12.50    Types: Cigarettes   Smokeless tobacco: Never  Vaping Use   Vaping Use: Never used  Substance and Sexual Activity   Alcohol use: No   Drug use: No   Sexual activity: Not Currently  Other Topics Concern   Not on file  Social History Narrative   Pt lives at [facility].    Social Determinants of Health   Financial Resource Strain: Not on file  Food Insecurity: Not on file  Transportation Needs: Not on file  Physical Activity: Not on file  Stress: Not on file  Social Connections: Not on file     Family History: The patient's family history includes Emphysema in her maternal aunt; Heart failure in her mother; Hypertension in her brother, maternal grandmother, mother, paternal aunt, paternal grandmother,  paternal uncle, and son; Stroke in her brother.  ROS:   Please see the history of present illness.     All other systems reviewed and are negative.  EKGs/Labs/Other Studies Reviewed:    The following studies were reviewed today:  2D echo 08/09/2020: 1. Left ventricular ejection fraction, by estimation, is 60 to 65%. The  left ventricle has normal function. The left ventricle has no regional  wall motion abnormalities. There is mild left ventricular hypertrophy.  Left ventricular diastolic parameters  are consistent with Grade II diastolic dysfunction (pseudonormalization).   2. Right ventricular systolic function is normal. The right ventricular  size is normal. There is normal pulmonary artery systolic pressure. The  estimated right ventricular systolic pressure is 60.7 mmHg.   3. The mitral valve is normal in structure. Mild mitral valve  regurgitation.  __________   2D echo 04/07/2019:  1. The left ventricle has normal systolic function with an ejection  fraction of 60-65%. The cavity size was normal. Left ventricular  diastolic  parameters were normal.   2. The right ventricle has normal systolic function. The cavity was  normal. There is no increase in right ventricular wall thickness. Mildly  elevated RVSP, 32 mm Hg   3. Left atrial size was mild-moderately dilated.  EKG:  EKG is  ordered today.  The ekg ordered today demonstrates ST, 104bpm, biatiail enlargement, minimal ST depression possible rate related.   Recent Labs: 05/14/2021: TSH 9.516 07/29/2021: ALT 21 07/30/2021: Magnesium 1.8 09/11/2021: BUN 9; Creatinine, Ser 0.44; Hemoglobin 12.4; Platelets 427; Potassium 3.5; Sodium 134  Recent Lipid Panel    Component Value Date/Time   CHOL 129 08/09/2020 0421   CHOL 180 12/21/2018 1041   CHOL 221 04/23/2015 0000   TRIG 87 08/09/2020 0421   TRIG 97 12/18/2016 0000   HDL 52 08/09/2020 0421   HDL 71 12/21/2018 1041   HDL 97 12/18/2016 0000   CHOLHDL 2.5 08/09/2020 0421   VLDL 17 08/09/2020 0421   VLDL 9 04/23/2015 0000   LDLCALC 60 08/09/2020 0421   LDLCALC 92 12/21/2018 1041   LDLCALC 108 04/23/2015 0000     Physical Exam:    VS:  BP 129/84 (BP Location: Left Arm, Patient Position: Sitting, Cuff Size: Normal)   Pulse (!) 104   Ht 5\' 6"  (1.676 m)   Wt 109 lb (49.4 kg)   LMP 11/19/1988 (Exact Date)   SpO2 99% Comment: on oxygen 3 Liters  BMI 17.59 kg/m     Wt Readings from Last 3 Encounters:  09/11/21 109 lb (49.4 kg)  09/11/21 109 lb (49.4 kg)  07/31/21 126 lb 5.2 oz (57.3 kg)     GEN: Frail elderly WF HEENT: Normal NECK: No JVD; No carotid bruits LYMPHATICS: No lymphadenopathy CARDIAC: tachycardic, RR, no murmurs, rubs, gallops RESPIRATORY:  Clear to auscultation without rales, wheezing or rhonchi  ABDOMEN: Soft, non-tender, non-distended MUSCULOSKELETAL:  No edema; No deformity  SKIN: Warm and dry NEUROLOGIC:  Alert and oriented x 3 PSYCHIATRIC:  Normal affect   ASSESSMENT:    1. Respiratory failure, unspecified chronicity, unspecified whether with hypoxia or  hypercapnia ([place])   2. Nausea   3. PAF (paroxysmal atrial fibrillation) ([place])   4. Essential hypertension   5. Orthostatic hypotension   6. Syncope and collapse    PLAN:    In order of problems listed above:  Respiratory failure on 2-3 LO2 H/o PNA Discharged with home O2, following with palliative care. Does not have PCP. Lungs with  diffuse wheezing. She 99% on 3L, previously discharged on 2L. She is tachycardic. Overall does not appear well. Has multiple complicated issues that likely need ER work-up with possible admission.   Nausea, diarrhea Reports patient has had persistent diarrhea since d/c. Possibly dehydrated.   PAF EKG shows ST. Amiodarone stopped at discharge for possible lung toxicity. Continue Eliquis for stroke ppx.   HTN with orthostatic hypotension Orthostatics positive in the office. She is on toprol 12.5mg  BID  Syncope Reports sudden blackout episodes 2 days ago, no pro-dromal symptoms. Further work-up per ER.   H/o VT Previously on amiodarone. On Toprol 12,5mg  BID.  LLE Resolved, on lasix 20mg  daily.   Disposition: Follow up  pending ER work-up  with MD/APP    Signed, Swetha Rayle Ninfa Meeker, PA-C  09/11/2021 8:54 PM    [facility]

## 2021-09-11 NOTE — ED Notes (Signed)
Her ride (Caregiver Alda Berthold) [phone] call when discharged

## 2021-09-11 NOTE — ED Notes (Addendum)
Pt to ED for palpitations, states HR has been over 200 and has been having episodes of a fib. Pt saw cardiologist today. HR currently 102, ST on monitor. Hx afib.  Pt is cancer pt, pt wears 3L chronically. Denies CP. Daughter states pt became hypoxic when walked a few steps at home today (80s).   Pt is hard to understand but is alert and oriented.

## 2021-09-11 NOTE — ED Provider Notes (Signed)
[facility] Emergency Department Provider Note  ____________________________________________   I have reviewed the triage vital signs and the nursing notes.   HISTORY  Chief Complaint Palpitations   History limited by: Not Limited   HPI Shannon Schroeder is a 65 y.o. female who presents to the emergency department today from cardiology office.  She states she was sent here because of something that the cardiologist saw on the EKG which concerned them although she is not sure what that was.  Patient does state that over the past month or so she has felt weak.  She has noticed this primarily when she has been trying to walk.  She states that this all started after an admission to the hospital for pneumonia.  The patient has had some shortness of breath but she states that she has felt like her heart has been racing at times.    Records reviewed. Per medical record review patient has a history of admission to hospital a little over a month ago for pneumonia.  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   Encounter for antineoplastic immunotherapy 04/23/2021   Moderate protein-calorie malnutrition ([place]) 04/14/2021   Dehydration 10/14/2020   SVT (supraventricular tachycardia) (HCC)    Hypomagnesemia    Dysphagia    Severe sepsis ([place])  09/06/2020   Drug-induced constipation 08/26/2020   Hypotension 08/12/2020   Encounter for antineoplastic chemotherapy 08/08/2020   Atrial fibrillation with RVR ([place]) 08/08/2020   Elevated troponin 08/08/2020   Leukocytosis 08/08/2020   Hypokalemia 08/08/2020   SIRS (systemic inflammatory response syndrome) ([place]) 08/08/2020   Goals of care, counseling/discussion 08/06/2020   Head and neck cancer ([place]) 07/29/2020   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[place]) 07/29/2020   Weight loss 07/29/2020   Neoplasm related pain 07/29/2020   Abscess of upper gum 09/12/2019   History of atrial fibrillation 03/22/2019   Bipolar affective disorder, current episode mixed ([place]) 06/29/2018   Cerebral edema ([place]) 06/25/2018   Hypertensive emergency 06/16/2018   Subarachnoid bleed ([place]) 06/16/2018   TIA (transient ischemic attack) 06/15/2018   Tobacco abuse 06/02/2018   COPD (chronic obstructive pulmonary disease) ([place]) 04/21/2018   URI (upper respiratory infection) 03/31/2018   Hypertension 03/31/2018   Tachycardia 02/02/2018   Rash of body 02/02/2018   Hyponatremia 02/02/2018    Past Surgical History:  Procedure Laterality Date   ABDOMINAL HYSTERECTOMY     partial   BREAST SURGERY     biopsy   CARDIAC CATHETERIZATION     X 2   CARPAL TUNNEL RELEASE Right    CARPECTOMY HAND Right    CATARACT EXTRACTION W/PHACO Left 03/08/2018   Procedure: CATARACT EXTRACTION PHACO AND INTRAOCULAR LENS PLACEMENT ([place]);  Surgeon: Birder Robson, MD;  Location: [facility] ORS;  Service: Ophthalmology;  Laterality: Left;  [place] 00:55 AP% 13.5 CDE 7.52 Fluid pack lot # [id number]   CATARACT EXTRACTION W/PHACO Right 04/05/2018   Procedure: CATARACT EXTRACTION PHACO AND INTRAOCULAR LENS PLACEMENT (IOC);  Surgeon: Birder Robson, MD;  Location: [facility] ORS;  Service: Ophthalmology;  Laterality: Right;  [place] 00:36 AP% 15.9 CDE 5.72 Fluid pack lot # [id number] H   CEREBRAL ANEURYSM REPAIR     CHOLECYSTECTOMY     COLONOSCOPY WITH PROPOFOL N/A  04/26/2019   Procedure: COLONOSCOPY WITH PROPOFOL;  Surgeon: Jonathon Bellows, MD;  Location: [facility] ENDOSCOPY;  Service: Gastroenterology;  Laterality: N/A;   PORTACATH PLACEMENT Left 08/05/2020   Procedure: INSERTION PORT-A-CATH;  Surgeon: Herbert Pun, MD;  Location: [facility] ORS;  Service: General;  Laterality: Left;   TUBAL LIGATION      Prior to Admission medications   Medication Sig Start Date End Date Taking? Authorizing Provider  albuterol (VENTOLIN HFA) 108 (90 Base) MCG/ACT inhaler Inhale 2 puffs into the lungs every 6 (six) hours as needed for wheezing or shortness of breath. 05/14/21   Earlie Server, MD  amiodarone (PACERONE) 200 MG tablet Take 1 tablet (200 mg total) by mouth daily. Hold this medication until you see your cardiologist and/or pulmonologist 08/08/21   Wyvonnia Dusky, MD  apixaban (ELIQUIS) 5 MG TABS tablet Take 1 tablet (5 mg total) by mouth 2 (two) times daily. 11/25/20   Minna Merritts, MD  chlorhexidine (PERIDEX) 0.12 % solution Use as directed 15 mLs in the mouth or throat 2 (two) times daily. 05/21/21   Earlie Server, MD  docusate sodium (COLACE) 100 MG capsule Take 1 capsule (100 mg total) by mouth daily. 08/26/20   Earlie Server, MD  feeding supplement, ENSURE ENLIVE, (ENSURE ENLIVE) LIQD Take 237 mLs by mouth 3 (three) times daily between meals. 08/10/20   Fritzi Mandes, MD  fentaNYL (DURAGESIC) 75 MCG/HR Place 1 patch onto the skin every 3 (three) days. 09/02/21   Borders, Kirt Boys, NP  fluticasone-salmeterol (ADVAIR HFA) [phone] MCG/ACT inhaler Inhale 2 puffs into the lungs 2 (two) times daily. 06/04/21   Earlie Server, MD  furosemide (LASIX) 20 MG tablet Take 1 tablet (20 mg total) by mouth daily as needed (for lower extremity swelling). Patient not taking: No sig reported 09/20/20 12/19/20  Rise Mu, PA-C  gabapentin (NEURONTIN) 300 MG capsule Take 2 capsules (600 mg total) by mouth 3 (three) times daily. 08/11/21   Borders, Kirt Boys, NP  lidocaine-prilocaine (EMLA) cream Apply 1  application topically as needed [facility]). Apply to affected area once 08/11/21   Borders, Kirt Boys, NP  magnesium chloride (SLOW-MAG) 64 MG TBEC SR tablet Take 1 tablet (64 mg total) by mouth 2 (two) times daily. Patient not taking: Reported on 07/30/2021 10/07/20   Earlie Server, MD  Melatonin 10 MG TABS Take 10 mg by mouth at bedtime as needed (sleep).    [provider]  metoprolol tartrate (LOPRESSOR) 25 MG tablet Take 0.5 tablets (12.5 mg total) by mouth 2 (two) times daily. 09/08/21   Minna Merritts, MD  Multiple Vitamins-Minerals (CENTRUM SILVER PO) Take 1 tablet by mouth daily. Gummie    [provider]  ondansetron (ZOFRAN-ODT) 8 MG disintegrating tablet Take 1 tablet (8 mg total) by mouth every 8 (eight) hours as needed for nausea or vomiting. 08/11/21   Borders, Kirt Boys, NP  oxcarbazepine (TRILEPTAL) 600 MG tablet Take 1 tablet (600 mg total) by mouth 2 (two) times daily. 07/11/21  Ventura Sellers, MD  Oxycodone HCl 10 MG TABS Take 1-2 tablets (10-20 mg total) by mouth every 4 (four) hours as needed. 09/02/21   Borders, Kirt Boys, NP  polyethylene glycol powder (GLYCOLAX/MIRALAX) 17 GM/SCOOP powder Take 17 g by mouth daily as needed for mild constipation or moderate constipation. 09/11/20   Mercy Riding, MD  potassium chloride SA (KLOR-CON) 20 MEQ tablet Take 2 tablets (40 mEq total) by mouth daily. 05/21/21   Earlie Server, MD  predniSONE (DELTASONE) 50 MG tablet 50 mg daily x 7 days, 40 mg daily x 7 days, 30 mg daily x 7 days, 20 mg daily x 7 days, & 10 mg daily x 7 days then stop 08/09/21   Wyvonnia Dusky, MD  prochlorperazine (COMPAZINE) 10 MG tablet Take 1 tablet (10 mg total) by mouth every 6 (six) hours as needed (Nausea or vomiting). 08/11/21   Borders, Kirt Boys, NP  traZODone (DESYREL) 50 MG tablet Take 1-2 tablets (50-100 mg total) by mouth at bedtime as needed for sleep. 07/15/21   Borders, Kirt Boys, NP    Allergies Naproxen, Aspirin, Belladonna alkaloids, Fluoxetine,  Fluoxetine hcl, Paroxetine hcl, Phenobarbital, and Prozac [fluoxetine hcl]  Family History  Problem Relation Age of Onset   Hypertension Mother    Heart failure Mother    Hypertension Brother    Stroke Brother    Hypertension Son    Emphysema Maternal Aunt    Hypertension Paternal Aunt    Hypertension Paternal Uncle    Hypertension Maternal Grandmother    Hypertension Paternal Grandmother     Social History Social History   Tobacco Use   Smoking status: Every Day    Packs/day: 0.25    Years: 50.00    Pack years: 12.50    Types: Cigarettes   Smokeless tobacco: Never  Vaping Use   Vaping Use: Never used  Substance Use Topics   Alcohol use: No   Drug use: No    Review of Systems Constitutional: No fever/chills. Positive for weakness.  Eyes: No visual changes. ENT: No sore throat. Cardiovascular: Denies chest pain. Positive for palpitations.  Respiratory: Denies shortness of breath. Gastrointestinal: No abdominal pain.  No nausea, no vomiting.  No diarrhea.   Genitourinary: Negative for dysuria. Musculoskeletal: Negative for back pain. Skin: Negative for rash. Neurological: Negative for headaches, focal weakness or numbness.  ____________________________________________   PHYSICAL EXAM:  VITAL SIGNS: ED Triage Vitals  Enc Vitals Group     BP 09/11/21 1641 139/74     Pulse Rate 09/11/21 1641 (!) 112     Resp 09/11/21 1641 (!) 22     Temp 09/11/21 1641 98.7 F (37.1 C)     Temp Source 09/11/21 1641 Oral     SpO2 09/11/21 1641 100 %     Weight 09/11/21 1642 109 lb (49.4 kg)     Height 09/11/21 1642 5\' 6"  (1.676 m)     Head Circumference --      Peak Flow --      Pain Score 09/11/21 1641 7   Constitutional: Alert and oriented.  Eyes: Conjunctivae are normal.  ENT      Head: Normocephalic and atraumatic.      Nose: No congestion/rhinnorhea.      Mouth/Throat: Mucous membranes are moist.      Neck: No stridor. Hematological/Lymphatic/Immunilogical: No  cervical lymphadenopathy. Cardiovascular: Tachycardic, regular rhythm.  No murmurs, rubs, or gallops.  Respiratory: Normal respiratory effort without tachypnea nor retractions. Breath sounds are clear  and equal bilaterally. No wheezes/rales/rhonchi. Gastrointestinal: Soft and non tender. No rebound. No guarding.  Genitourinary: Deferred Musculoskeletal: Normal range of motion in all extremities. No lower extremity edema. Neurologic:  Normal speech and language. No gross focal neurologic deficits are appreciated.  Skin:  Skin is warm, dry and intact. No rash noted. Psychiatric: Mood and affect are normal. Speech and behavior are normal. Patient exhibits appropriate insight and judgment.  ____________________________________________    LABS (pertinent positives/negatives)  Trop hs 14 to 13 BMP na 134, k 3.5, glu 98, cr 0.44 CBC wbc 8.8, hgb 12.4, plt 427 Procalcitonin <10 ____________________________________________   EKG  I, Nance Pear, attending physician, personally viewed and interpreted this EKG  EKG Time: 1643 Rate: 111 Rhythm: sinus tachycardia Axis: normal Intervals: qtc 446 QRS: narrow ST changes: no st elevation Impression: abnormal ekg   ____________________________________________    RADIOLOGY  CXR Improved patchy opacities  CT angio No PE. Improvement in opacities and ground glass opacities  ____________________________________________   PROCEDURES  Procedures  ____________________________________________   INITIAL IMPRESSION / ASSESSMENT AND PLAN / ED COURSE  Pertinent labs & imaging results that were available during my care of the patient were reviewed by me and considered in my medical decision making (see chart for details).   Patient presents to the emergency department today from cardiology office.  Patient does endorse weakness from admission to the hospital roughly 1 month ago.  Work-up here without any acute concerning findings.   Her troponin was negative x2.  I do think she might of been slightly dehydrated and her heart rate did improve after IV fluids.  Chest x-ray did show improving patchy opacities.  Given recent hospitalization I did have some concern for possible PE so CT angio was obtained.  This did not show any pulmonary embolism and it again showed improved airspace disease.  Procalcitonin was negative.  No leukocytosis or fevers.  At this time I doubt continued bacterial pneumonia.  Discussed findings with patient.  Will plan on discharge and follow-up with outpatient providers.  ____________________________________________   FINAL CLINICAL IMPRESSION(S) / ED DIAGNOSES  Final diagnoses:  Weakness     Note: This dictation was prepared with Dragon dictation. Any transcriptional errors that result from this process are unintentional     Nance Pear, MD 09/11/21 2243

## 2021-09-11 NOTE — ED Triage Notes (Signed)
Pt sent from [facility] for abnormal EKG. Pt on 3 L [place] chronic. Reports minor chest pain. Pt cancer pt. Last chemo a month ago.

## 2021-09-11 NOTE — ED Notes (Signed)
Warm blankets provided to pt. Daughter stepped out and will return.

## 2021-09-11 NOTE — Discharge Instructions (Signed)
Please seek medical attention for any high fevers, chest pain, shortness of breath, change in behavior, persistent vomiting, bloody stool or any other new or concerning symptoms.  

## 2021-09-11 NOTE — ED Notes (Signed)
Patient transported to CT 

## 2021-09-11 NOTE — ED Notes (Signed)
Pt to xray

## 2021-09-11 NOTE — ED Notes (Signed)
Pt resting in bed playing on her phone at this time.  Denies any needs.

## 2021-09-15 MED ORDER — METOPROLOL TARTRATE 25 MG PO TABS: 12.5000 mg | ORAL_TABLET | Freq: Two times a day (BID) | ORAL | 2 refills | Status: DC

## 2021-09-29 ENCOUNTER — Encounter: Payer: Self-pay | Admitting: Hospice and Palliative Medicine

## 2021-09-30 ENCOUNTER — Other Ambulatory Visit: Payer: Self-pay

## 2021-09-30 MED ORDER — OXYCODONE HCL 10 MG PO TABS
10.0000 mg | ORAL_TABLET | ORAL | 0 refills | Status: DC | PRN
Start: 2021-09-30 — End: 2021-10-07

## 2021-09-30 MED ORDER — FENTANYL 75 MCG/HR TD PT72: 1.0000 | MEDICATED_PATCH | TRANSDERMAL | 0 refills | Status: DC

## 2021-10-07 ENCOUNTER — Encounter: Payer: Self-pay | Admitting: Oncology

## 2021-10-07 ENCOUNTER — Other Ambulatory Visit: Payer: Self-pay

## 2021-10-07 ENCOUNTER — Inpatient Hospital Stay: Payer: Medicare Other | Attending: Oncology | Admitting: Oncology

## 2021-10-07 ENCOUNTER — Encounter: Payer: Self-pay | Admitting: Hospice and Palliative Medicine

## 2021-10-07 ENCOUNTER — Inpatient Hospital Stay (HOSPITAL_BASED_OUTPATIENT_CLINIC_OR_DEPARTMENT_OTHER): Payer: Medicare Other | Admitting: Hospice and Palliative Medicine

## 2021-10-07 VITALS — BP 124/87 | HR 105 | Temp 98.2°F | Resp 18 | Wt 105.7 lb

## 2021-10-07 DIAGNOSIS — F319 Bipolar disorder, unspecified: Secondary | ICD-10-CM | POA: Diagnosis not present

## 2021-10-07 DIAGNOSIS — Z515 Encounter for palliative care: Secondary | ICD-10-CM

## 2021-10-07 DIAGNOSIS — F17210 Nicotine dependence, cigarettes, uncomplicated: Secondary | ICD-10-CM | POA: Diagnosis not present

## 2021-10-07 DIAGNOSIS — J449 Chronic obstructive pulmonary disease, unspecified: Secondary | ICD-10-CM | POA: Insufficient documentation

## 2021-10-07 DIAGNOSIS — F419 Anxiety disorder, unspecified: Secondary | ICD-10-CM | POA: Diagnosis not present

## 2021-10-07 DIAGNOSIS — Z9221 Personal history of antineoplastic chemotherapy: Secondary | ICD-10-CM | POA: Diagnosis not present

## 2021-10-07 DIAGNOSIS — Z923 Personal history of irradiation: Secondary | ICD-10-CM | POA: Diagnosis not present

## 2021-10-07 DIAGNOSIS — G893 Neoplasm related pain (acute) (chronic): Secondary | ICD-10-CM

## 2021-10-07 DIAGNOSIS — C310 Malignant neoplasm of maxillary sinus: Secondary | ICD-10-CM | POA: Diagnosis not present

## 2021-10-07 DIAGNOSIS — Z79891 Long term (current) use of opiate analgesic: Secondary | ICD-10-CM | POA: Diagnosis not present

## 2021-10-07 DIAGNOSIS — R1310 Dysphagia, unspecified: Secondary | ICD-10-CM | POA: Diagnosis not present

## 2021-10-07 DIAGNOSIS — Z79899 Other long term (current) drug therapy: Secondary | ICD-10-CM | POA: Insufficient documentation

## 2021-10-07 DIAGNOSIS — E440 Moderate protein-calorie malnutrition: Secondary | ICD-10-CM | POA: Insufficient documentation

## 2021-10-07 DIAGNOSIS — C760 Malignant neoplasm of head, face and neck: Secondary | ICD-10-CM | POA: Diagnosis not present

## 2021-10-07 DIAGNOSIS — Z7901 Long term (current) use of anticoagulants: Secondary | ICD-10-CM | POA: Diagnosis not present

## 2021-10-07 DIAGNOSIS — Z8673 Personal history of transient ischemic attack (TIA), and cerebral infarction without residual deficits: Secondary | ICD-10-CM | POA: Insufficient documentation

## 2021-10-07 DIAGNOSIS — I480 Paroxysmal atrial fibrillation: Secondary | ICD-10-CM | POA: Diagnosis not present

## 2021-10-07 DIAGNOSIS — Z8679 Personal history of other diseases of the circulatory system: Secondary | ICD-10-CM | POA: Diagnosis not present

## 2021-10-07 DIAGNOSIS — F32A Depression, unspecified: Secondary | ICD-10-CM | POA: Insufficient documentation

## 2021-10-07 DIAGNOSIS — Z7189 Other specified counseling: Secondary | ICD-10-CM

## 2021-10-07 DIAGNOSIS — I671 Cerebral aneurysm, nonruptured: Secondary | ICD-10-CM | POA: Diagnosis not present

## 2021-10-07 MED ORDER — FENTANYL 100 MCG/HR TD PT72: 1.0000 | MEDICATED_PATCH | TRANSDERMAL | 0 refills | Status: DC

## 2021-10-07 MED ORDER — OXYCODONE HCL 30 MG PO TABS: 30.0000 mg | ORAL_TABLET | ORAL | 0 refills | Status: DC | PRN

## 2021-10-07 MED ORDER — MEGESTROL ACETATE 625 MG/5ML PO SUSP: 625.0000 mg | Freq: Every day | ORAL | 1 refills | Status: DC

## 2021-10-07 NOTE — Progress Notes (Signed)
[facility]  [phone] [phone]   Name: Shannon Schroeder Date: 10/07/2021 MRN: [id number]  DOB: 14-Apr-1956  Patient Care Team: Pcp, No as PCP - General Rockey Situ Kathlene November, MD as PCP - Cardiology (Cardiology) Earlie Server, MD as Consulting Physician (Oncology)    REASON FOR CONSULTATION: Shannon Schroeder is a 65 y.o. 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  Patient was hospitalized 07/30/2021-08/08/2021 with multifocal pneumonia versus possible drug-induced pneumonitis.  Patient has had severe facial pain.  She was referred to palliative care to help address goals and manage ongoing symptoms.  SOCIAL HISTORY:     reports that she has been smoking cigarettes. She has a 12.50 pack-year smoking history. She has never used smokeless tobacco. She reports that she does not drink alcohol and does not use drugs.   Patient is unmarried.  She has children from whom she is estranged.  She lives with a roommate and feels like she has good social support.  She worked at [employer] in [employer].  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    Anxiety    Arthritis    Bipolar disorder (HCC)    COPD (chronic obstructive pulmonary disease) (HCC)    NO INHALERS   Depression    Dysrhythmia    H/O V TACH   Head and neck cancer ([place]) 07/29/2020   Headache    H/O MIGRAINES   Hypertension    Seizures ([place])    X1 AFTER FALL    PAST SURGICAL HISTORY:  Past Surgical History:  Procedure Laterality Date   ABDOMINAL HYSTERECTOMY     partial   BREAST SURGERY     biopsy   CARDIAC CATHETERIZATION     X 2   CARPAL TUNNEL RELEASE Right    CARPECTOMY HAND Right     CATARACT EXTRACTION W/PHACO Left 03/08/2018   Procedure: CATARACT EXTRACTION PHACO AND INTRAOCULAR LENS PLACEMENT ([place]);  Surgeon: Birder Robson, MD;  Location: [facility] ORS;  Service: Ophthalmology;  Laterality: Left;  [place] 00:55 AP% 13.5 CDE 7.52 Fluid pack lot # [id number]   CATARACT EXTRACTION W/PHACO Right 04/05/2018   Procedure: CATARACT EXTRACTION PHACO AND INTRAOCULAR LENS PLACEMENT ([facility]);  Surgeon: Birder Robson, MD;  Location: [facility] ORS;  Service: Ophthalmology;  Laterality: Right;  [place] 00:36 AP% 15.9 CDE 5.72 Fluid pack lot # [id number]   CEREBRAL ANEURYSM REPAIR     CHOLECYSTECTOMY     COLONOSCOPY WITH PROPOFOL N/A 04/26/2019   Procedure: COLONOSCOPY WITH PROPOFOL;  Surgeon: Jonathon Bellows, MD;  Location: [facility] ENDOSCOPY;  Service: Gastroenterology;  Laterality: N/A;   PORTACATH PLACEMENT Left 08/05/2020   Procedure: INSERTION PORT-A-CATH;  Surgeon: Herbert Pun, MD;  Location: [facility] ORS;  Service: General;  Laterality: Left;   TUBAL LIGATION      HEMATOLOGY/ONCOLOGY HISTORY:  Oncology History  Head and neck cancer ([place])  07/29/2020 Initial Diagnosis   Head and neck cancer ([place])   08/08/2020 - 08/08/2020 Chemotherapy   The patient had dexamethasone (DECADRON) 4 MG tablet, 8 mg, Oral, Daily, 0 of 1 cycle, Start date: --, End date: -- palonosetron (ALOXI) injection 0.25 mg, 0.25 mg, Intravenous,  Once, 0 of 3 cycles CISplatin (PLATINOL) 166 mg in sodium chloride 0.9 % 500  mL chemo infusion, 100 mg/m2, Intravenous,  Once, 0 of 3 cycles fosaprepitant (EMEND) 150 mg in sodium chloride 0.9 % 145 mL IVPB, 150 mg, Intravenous,  Once, 0 of 3 cycles  for chemotherapy treatment.    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  MEDICATIONS:  Current Outpatient Medications  Medication Sig Dispense Refill   ADVAIR HFA 45-21 MCG/ACT inhaler INHALE 2 PUFFS INTO THE LUNGS TWICE DAILY 12 g 6   albuterol (VENTOLIN HFA) 108 (90 Base) MCG/ACT inhaler Inhale 2 puffs into the lungs every 6 (six) hours as needed for wheezing or shortness of breath. 8 g 2   amiodarone (PACERONE) 200 MG tablet Take 1 tablet (200 mg total) by mouth daily. Hold this medication until you see your cardiologist and/or pulmonologist (Patient not taking: Reported on 10/07/2021) 90 tablet 3   apixaban (ELIQUIS) 5 MG TABS tablet Take 1 tablet (5 mg total) by mouth 2 (two) times daily. 180 tablet 3   chlorhexidine (PERIDEX) 0.12 % solution Use as directed 15 mLs in the mouth or throat 2 (two) times daily. 473 mL 6   docusate sodium (COLACE) 100 MG capsule Take 1 capsule (100 mg total) by mouth daily. 30 capsule 1   feeding supplement, ENSURE ENLIVE, (ENSURE ENLIVE) LIQD Take 237 mLs by mouth 3 (three) times daily between meals. 237 mL 12   fentaNYL (DURAGESIC) 75 MCG/HR Place 1 patch onto the skin every 3 (three) days. 10 patch 0   furosemide (LASIX) 20 MG tablet Take 1 tablet (20 mg total) by mouth daily as needed (for lower extremity swelling). (Patient not taking: Reported on 04/14/2021) 90 tablet 1   gabapentin (NEURONTIN) 300 MG capsule Take 2 capsules (600 mg total) by mouth 3 (three) times daily. 180 capsule 1   lidocaine-prilocaine (EMLA) cream Apply 1 application topically as needed [facility]). Apply to affected area once 30 g 2   magnesium chloride (SLOW-MAG) 64 MG TBEC SR tablet Take 1 tablet (64 mg total) by mouth 2 (two) times daily. 60 tablet 1   megestrol (MEGACE ES) 625 MG/5ML suspension  Take 5 mLs (625 mg total) by mouth daily. 150 mL 1   Melatonin 10 MG TABS Take 10 mg by mouth at bedtime as needed (sleep).     metoprolol tartrate (LOPRESSOR) 25 MG tablet Take 0.5 tablets (12.5 mg total) by mouth 2 (two) times daily. 45 tablet 2   Multiple Vitamins-Minerals (CENTRUM SILVER PO) Take 1 tablet by mouth daily. Gummie     ondansetron (ZOFRAN-ODT) 8 MG disintegrating tablet Take 1 tablet (8 mg total) by mouth every 8 (eight) hours as needed for nausea or vomiting. 90 tablet 0   oxcarbazepine (TRILEPTAL) 600 MG tablet Take 1 tablet (600 mg total) by mouth 2 (two) times daily. 60 tablet 3   Oxycodone HCl 10 MG TABS Take 1-2 tablets (10-20 mg total) by mouth every 4 (four) hours as needed. 120 tablet 0  polyethylene glycol powder (GLYCOLAX/MIRALAX) 17 GM/SCOOP powder Take 17 g by mouth daily as needed for mild constipation or moderate constipation. 255 g 0   potassium chloride SA (KLOR-CON) 20 MEQ tablet Take 2 tablets (40 mEq total) by mouth daily. 60 tablet 1   predniSONE (DELTASONE) 50 MG tablet 50 mg daily x 7 days, 40 mg daily x 7 days, 30 mg daily x 7 days, 20 mg daily x 7 days, & 10 mg daily x 7 days then stop (Patient not taking: Reported on 10/07/2021) 105 tablet 0   prochlorperazine (COMPAZINE) 10 MG tablet Take 1 tablet (10 mg total) by mouth every 6 (six) hours as needed (Nausea or vomiting). 30 tablet 1   traZODone (DESYREL) 50 MG tablet Take 1-2 tablets (50-100 mg total) by mouth at bedtime as needed for sleep. 60 tablet 2   No current facility-administered medications for this visit.   Facility-Administered Medications Ordered in Other Visits  Medication Dose Route Frequency Provider Last Rate Last Admin   heparin lock flush 100 UNIT/ML injection            heparin lock flush 100 UNIT/ML injection            heparin lock flush 100 unit/mL  500 Units Intravenous Once Earlie Server, MD       heparin lock flush 100 unit/mL  500 Units Intracatheter Once PRN Earlie Server, MD        sodium chloride flush (NS) 0.9 % injection 10 mL  10 mL Intravenous PRN Earlie Server, MD   10 mL at 10/10/20 1040   sodium chloride flush (NS) 0.9 % injection 10 mL  10 mL Intravenous PRN Earlie Server, MD   10 mL at 11/13/20 0836   sodium chloride flush (NS) 0.9 % injection 10 mL  10 mL Intravenous PRN Earlie Server, MD   10 mL at 12/30/20 0838   sodium chloride flush (NS) 0.9 % injection 10 mL  10 mL Intravenous PRN Earlie Server, MD   10 mL at 01/14/21 0921   sodium chloride flush (NS) 0.9 % injection 10 mL  10 mL Intravenous PRN Earlie Server, MD   10 mL at 06/11/21 0815   sodium chloride flush (NS) 0.9 % injection 10 mL  10 mL Intravenous PRN Earlie Server, MD   10 mL at 07/09/21 0814    VITAL SIGNS: LMP 11/19/1988 (Exact Date)  There were no vitals filed for this visit.  Estimated body mass index is 17.06 kg/m as calculated from the following:   Height as of 09/11/21: 5\' 6"  (1.676 m).   Weight as of an earlier encounter on 10/07/21: 105 lb 11.2 oz (47.9 kg).  LABS: CBC:    Component Value Date/Time   WBC 8.8 09/11/2021 1641   HGB 12.4 09/11/2021 1641   HGB 13.8 03/15/2019 1201   HCT 38.1 09/11/2021 1641   HCT 39.0 03/15/2019 1201   HCT 36 12/18/2016 0000   PLT 427 (H) 09/11/2021 1641   PLT 325 03/15/2019 1201   MCV 92.3 09/11/2021 1641   MCV 96 03/15/2019 1201   MCV 98 12/18/2016 0000   NEUTROABS 9.8 (H) 08/02/2021 1141   NEUTROABS 2.4 05/03/2018 1826   LYMPHSABS 0.6 (L) 08/02/2021 1141   LYMPHSABS 1.6 05/03/2018 1826   MONOABS 0.7 08/02/2021 1141   EOSABS 0.0 08/02/2021 1141   EOSABS 0.2 05/03/2018 1826   BASOSABS 0.0 08/02/2021 1141   BASOSABS 0.1 05/03/2018 1826   BASOSABS 2 12/18/2016 0000   Comprehensive  Metabolic Panel:    Component Value Date/Time   NA 134 (L) 09/11/2021 1641   NA 135 03/15/2019 1201   K 3.5 09/11/2021 1641   K 4.2 06/26/2015 0000   CL 96 (L) 09/11/2021 1641   CL 91 05/25/2016 0000   CO2 28 09/11/2021 1641   CO2 25 08/19/2016 0000   BUN 9 09/11/2021 1641   BUN 12  03/15/2019 1201   BUN 11 12/18/2016 0000   CREATININE 0.44 09/11/2021 1641   CREATININE 0.67 12/18/2016 0000   GLUCOSE 98 09/11/2021 1641   CALCIUM 9.1 09/11/2021 1641   CALCIUM 8.9 12/18/2016 0000   AST 21 07/29/2021 2030   AST 15 12/18/2016 0000   ALT 21 07/29/2021 2030   ALT 11 08/19/2016 0000   ALKPHOS 216 (H) 07/29/2021 2030   ALKPHOS 117 12/18/2016 0000   BILITOT 0.5 07/29/2021 2030   BILITOT <0.2 03/15/2019 1201   BILITOT 0.3 12/18/2016 0000   PROT 7.4 07/29/2021 2030   PROT 6.8 03/15/2019 1201   ALBUMIN 2.6 (L) 07/29/2021 2030   ALBUMIN 4.5 03/15/2019 1201   ALBUMIN 4.5 12/18/2016 0000    RADIOGRAPHIC STUDIES: DG Chest 2 View  Result Date: 09/11/2021 CLINICAL DATA:  Chest pain EXAM: CHEST - 2 VIEW COMPARISON:  08/07/2021, CT 07/30/2021 FINDINGS: Left-sided central venous port tip over the SVC. Improved aeration since 08/07/2021 with nearly resolved bilateral airspace opacities. Patchy residual opacities are noted and may reflect scarring or residual airspace disease. Underlying diffuse interstitial opacity which may be due to chronic change. Stable cardiomediastinal silhouette with aortic atherosclerosis. No pneumothorax. IMPRESSION: Patchy bilateral pulmonary opacities, significantly improved since 08/07/2021. No new abnormality is evident. Electronically Signed   By: Donavan Foil M.D.   On: 09/11/2021 17:39   CT Angio Chest PE W and/or Wo Contrast  Result Date: 09/11/2021 CLINICAL DATA:  Shortness of breath. EXAM: CT ANGIOGRAPHY CHEST WITH CONTRAST TECHNIQUE: Multidetector CT imaging of the chest was performed using the standard protocol during bolus administration of intravenous contrast. Multiplanar CT image reconstructions and MIPs were obtained to evaluate the vascular anatomy. CONTRAST:  42mL OMNIPAQUE IOHEXOL 350 MG/ML SOLN COMPARISON:  Chest radiograph dated 09/11/2021. CT dated 07/30/2021. FINDINGS: Cardiovascular: There is no cardiomegaly or pericardial effusion.  Mild atherosclerotic calcification of the thoracic aorta. No aneurysmal dilatation or dissection. No pulmonary artery embolus identified. Left-sided Port-A-Cath in similar position. Mediastinum/Nodes: No hilar or mediastinal adenopathy. The esophagus is grossly unremarkable. No mediastinal fluid collection. Lungs/Pleura: Significant interval improvement in bilateral patchy and streaky ground-glass airspace opacities compared to the CT of 07/30/2021. No pleural effusion pneumothorax. The central airways are patent. Upper Abdomen: Cholecystectomy. Musculoskeletal: Osteopenia with degenerative changes of the spine. No acute osseous pathology. Fracture of the anterior superior L1 as seen on the prior CT. Review of the MIP images confirms the above findings. IMPRESSION: 1. No acute intrathoracic pathology. No CT evidence of pulmonary artery embolus. 2. Significant interval improvement in bilateral patchy and streaky ground-glass airspace opacities compared to the CT of 07/30/2021. 3. Aortic Atherosclerosis (ICD10-I70.0). Electronically Signed   By: Anner Crete M.D.   On: 09/11/2021 19:59     PERFORMANCE STATUS (ECOG) : 1 - Symptomatic but completely ambulatory  Review of Systems Unless otherwise noted, a complete review of systems is negative.  Physical Exam General: NAD Pulmonary: Unlabored Extremities: no edema, no joint deformities Skin: no rashes Neurological: Weakness but otherwise nonfocal  IMPRESSION: Routine follow-up.  Patient was seen after her visit with Dr. Tasia Catchings.  Patient is  felt to be actively declining.  Dr. Tasia Catchings has discussed the option for further chemotherapy but this has risk of negatively impacting her quality of life.  Comfort care/hospice has also been discussed as an option.  I answered patient's/caregiver's questions regarding hospice care.  They would like to think about decision-making and revisit after the holidays.  Dr. Tasia Catchings plans to repeat MRI for restaging.  This may  facilitate decision-making.  Patient endorses worsening facial pain.  She is currently on gabapentin, fentanyl, and takes oxycodone 20mg  every 4 hours around-the-clock.  Patient was previously tried on max dose of Trileptal but did not find that to be helpful.  She is currently off Trileptal.  Will plan to increase dose of fentanyl and oxycodone.  Patient endorses financial and food insecurity.  Will refer to social work.  I again discussed CODE STATUS and explained the probable futility associated with resuscitative efforts in the setting of an advanced malignancy.  I encouraged patient to think about decision-making.  PLAN: -Continue current scope of treatment -Continue gabapentin 600 mg 3 times daily -Increase fentanyl 100 mcg every 72 hours #10 -Increase oxycodone to 30 mg every 4 hours as needed for breakthrough pain -RTC 3 to 4 weeks  Case and plan discussed with Dr. Tasia Catchings   Patient expressed understanding and was in agreement with this plan. She also understands that She can call the clinic at any time with any questions, concerns, or complaints.     Time Total: 25 minutes  Visit consisted of counseling and education dealing with the complex and emotionally intense issues of symptom management and palliative care in the setting of serious and potentially life-threatening illness.Greater than 50%  of this time was spent counseling and coordinating care related to the above assessment and plan.  Signed by: Altha Harm, PhD, NP-C

## 2021-10-07 NOTE — Progress Notes (Signed)
Pt here for follow up. No new concerns voiced.   

## 2021-10-07 NOTE — Progress Notes (Signed)
Hematology/Oncology follow up note [facility] [phone] [phone]   Patient Care Team: Pcp, No as PCP - General Rockey Situ, Kathlene November, MD as PCP - Cardiology (Cardiology) Earlie Server, MD as Consulting Physician (Oncology)  CHIEF COMPLAINTS/REASON FOR VISIT:  Follow-up for head and neck cancer  HISTORY OF PRESENTING ILLNESS:  07/10/2020 she presented emergency room for evaluation of right-sided facial swelling and pain in the right side of her mouth for the past 6 weeks.  It started in her mouth with a small ulcer which progressively got worse.  She wears denture.  Not able to chew well.  She eats soft food. She was found to have right upper gum also with yellowish discharge noted. 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  Avoid piloting.,  Right orbital apex, also the right soft palate and palate and tonsil.  Furthermore there is evidence of intracranial extension along the right V2 at the foramen rotundum Small suspicious right level 1A lymph node measuring 7 mm.  Separate small indeterminate but suspicious enhancing soft tissue nodule of the left sublingual space.  Patient was referred to oncology for further evaluation management. Patient denies fever, chills, nausea vomiting diarrhea shortness of breath or cough.  Reports 10 out of 10 severe pain of right face and numbness.  She also has headache. Reports unintentional weight loss  She reports no contactable family members.  She is in the process of pointing her roommate [name] to be her power of attorney.  She also wants to record today's conversation for her roommate [name]. Patient has 50-pack-year smoking history.  # seen by ENT Dr.Juengel and had a biopsy. Biopsy was sent to [facility] and the final pathology was positive for invasive  squamous cell carcinoma, well-differentiated, keratinizing. 07/17/2020, brain MRI showed large right facial mass with right maxillary erosion involving the maxillary sinus, hard palate and pterygoid plates and muscles.  Perineural spreading along the multiple branches of the maxillary division of the right trigeminal nerve at the infraorbital canal, pterygopalatine fossa and foramen rotundum. 07/17/2020, PET scan showed Intense FDG uptake is associated with the large enhancing tumor which involves the right maxilla, right nasal cavity, right pterygoid palatine fossa, right orbital apex and right soft tissue palate and palatine tonsil. 2. Mild nonspecific FDG uptake is associated with the recently characterized suspicious right level 1A lymph node which measures 7 mm and has an SUV max of 2.12.  No signs of distant metastasis.  Aortic atherosclerosis.  Patient also reports a history of hypertension, dysrhythmia, history of V. tach.  Patient previously was on lisinopril, atenolol, amlodipine for which she is no longer taking at this point due to lack of medication coverage.  She also does not have any primary care provider. 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  On average she says she is takes 2 to 3 pills/day.  She also takes gabapentin 300 mg 3 times daily  # new onset of atrial fibrillation with rapid ventricular response.  Patient was started on metoprolol.  Eliquis for anticoagulation.  # 09/06/2020- 09/11/2020 admitted due to A. fib, hypotension, multifocal pneumonia.  Patient was treated with IV antibiotics.  Cardiology saw  the patient during admission and added amiodarone.  Patient was discharged on 5 days course of oral antibiotics, continued on metoprolol and amiodarone for atrial fibrillation.  # Dysphagia, she  declined PET tube placment.  #08/08/2020-10/11/2020 concurrent cisplatin and radiation. # 01/09/2021, PET scan showed improvement but persistent hypermetabolic activity associated with the right frontal sinus.Marked improvement in the posterior right nasopharynx with decrease in metabolic activity and tissue thickening.  Persistent but improved hypermetabolic thickened tissue in the anterior and medial right maxillary sinus.  Persistent hypermetabolic activity posterior at this level of pterygoid plate on the RIGHT. Potential new activity in the RIGHT skull base inferior medial along the sphenoid bone  # 01/14/2021, She had recurrent maxillary sinus squamous cell carcinoma and was referred to [facility]NT for discussion of feasibility of residual disease resection.  Patient stated at [facility] for her oncology care after that. Extensive outside medical records review was performed by me via Care Everywhere.   01/27/21 MRI brain with and without contrast were notable for recurrent disease in the right pterygopalatine fossa, along V2/V3 extending into the cavernous sinus and orbital fissure, as possibleperineural spread.  01/29/2021 patient establish care with [facility] ENT Dr. Ihor Austin..  Due to the extent of disease, patient was not considered as a surgical candidate.  Dr.Blumberg sent patient to establish care with [facility] for further discussion. Patient was recommended to proceed with neoadjuvant chemotherapy  Keis regimen followed by reevaluation of surgical resection.  Patient understands that treatment goal include symptom control and increase chance of disease control and her surgical outcome.  Patient understands that if she does not respond, same treatment will be continued but with palliative intent.   Keis regimen : Cetuximab 400 mg/m2 load then 250 mg/m2 weekly, PACLItaxel 135 mg/m2 weekly,CARBOplatin AUC 2 weekly for 6 weeks Alpha gal was negative   02/06/2021  C1D1  Cetux/Taxol/Carbo, able to finish chemo  without complications 5/63/8756, C1 D8 Cetux/Taxol/Carbo able to finish chemo without complications 02/3328 Cetux/Taxol/Carbo, she developed hypotension, nausea and flushing.  Patient was evaluated by nurse practitioner.  Was given IV fluid bolus with improvement of BP and the patient was able to finish infusion successfully.   03/04/2021, C1D22, Cetux/Taxol/Carbo Per note, patient developed hypersensitivity to carboplatin. "tolerated Cetuximab and Paclitaxel infusion without sequelae however during her Carbo infusion, she became acutely altered, momentarily unresponsive, acutely desaturated to 70s, then 80s on RA, hypotensive 81/51 with respiratory wheezing appreciated. The infusion was stopped. NS IVF were given along with pepcid & solumedrol. Patient's vital signs slowly responded but not to baseline. Pulse was thready and skin was cold and clammy. She followed some commands, slowly. A rapid response was called for the acute change in mental status and concerning vital signs. Patient was taken to ER for further evaluation and work up"   03/25/2021 C1 D29 Cetux/Taxol/Cisplatin carboplatin was switched to cisplatin Unfortunately patient developed hypersensitivity to cisplatin as well. Patient missed 5/10, 5/17 treatments due to weakness.   04/10/2019 2 repeat MRI brain with and without contrast showed evidence of disease response, however continues to have CN V involvement with extension to the skull base, unlikely to be a surgical candidate.  Her case was noted to be discussed on the head and neck tumor board at [facility]. Patient was recommended to switch to regimen with pembrolizumab and cetuximab given the intolerance of cis-platinum  8/262022 MRI brain, comparing to MRI done in June 2022 at outside facility, showed positive treatment response with decreasing tumor bulk in the right masticator space.  Known perineural tumor spread with dural thickening at the right middle cranial fossa that is  nonprogressive.  No evidence of cisternal or parenchymal brain involvement. 07/08/2021 PET, Continued response to therapy as evidenced by decreased wall,  thickening and hypermetabolism associated with a cavitary right maxillary/nasopharyngeal mass. No evidence of distant metastatic disease. 2. Subacute compression of the L1 superior endplate, new from 01/09/2021.  Aortic atherosclerosis   INTERVAL HISTORY ALONNIE BIEKER is a 65 y.o. female who has above history reviewed by me today presents for follow up visit for management of head and neck cancer,  07/30/2021 - 08/08/2021, patient was admitted due to increasing weakness and fatigue.  CT scan during that admission showed marked worsening multifocal groundglass opacities septal thickening in the lungs bilaterally.  Favored to reflect drug-induced pneumonitis but could alternatively reflect multilobar bilateral pneumonia.  Patient was seen by pulmonology during the admission.  She was treated empirically for multilobar pneumonia.  She was unable to be weaned from supplemental oxygen.  Discharged with home oxygen. 09/11/2021, patient was seen by cardiology for A. fib with RVR and SVT.  Patient continues to have tachycardia episodes.  She also had a syncope episode a few days prior to her cardiology visit.  Patient was sent to emergency room. In ER, patient had a negative work-up without any acute concerning findings.  Troponin was negative.  Patient's heart rate improved with IV fluids.  Chest x-ray showed improving patchy opacities. 09/11/2021 CT PE chest angiogram showed no PE.  Significant interval improvement in the bilateral patchy and streaky groundglass opacities compared to CT of 07/24/2021.  Aortic atherosclerosis  Patient has not followed up with cardiology since then.  Today she reports feeling weak.  She remains on oxygen.  Appetite is very poor.  She has lost weight.  4 pounds since her documented weight on 09/11/2021.  15 pounds since  07/16/2021.  Her right facial pain has increased.  She has appointment with palliative care service today.  No fever or chills.  Patient continues to smoke daily.  Review of Systems  Constitutional:  Positive for fatigue. Negative for appetite change, chills, fever and unexpected weight change.  HENT:   Negative for hearing loss, mouth sores and voice change.        Facial swelling, pain   Eyes:  Negative for eye problems.  Respiratory:  Negative for chest tightness, cough and shortness of breath.   Cardiovascular:  Negative for chest pain and palpitations.  Gastrointestinal:  Negative for abdominal distention, abdominal pain, blood in stool, constipation, nausea and vomiting.       Dysphagia  Endocrine: Negative for hot flashes.  Genitourinary:  Negative for difficulty urinating and frequency.   Musculoskeletal:  Negative for arthralgias.  Skin:  Negative for itching and rash.  Neurological:  Negative for extremity weakness and headaches.  Hematological:  Negative for adenopathy.  Psychiatric/Behavioral:  Negative for confusion.    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    Anxiety    Arthritis    Bipolar disorder (HCC)    COPD (chronic obstructive pulmonary disease) (HCC)    NO INHALERS   Depression    Dysrhythmia    H/O V TACH   Head and neck cancer ([place]) 07/29/2020   Headache    H/O MIGRAINES   Hypertension    Seizures ([place])    X1 AFTER FALL    SURGICAL  HISTORY: Past Surgical History:  Procedure Laterality Date   ABDOMINAL HYSTERECTOMY     partial   BREAST SURGERY     biopsy   CARDIAC CATHETERIZATION     X 2   CARPAL TUNNEL RELEASE Right    CARPECTOMY HAND Right    CATARACT EXTRACTION W/PHACO Left 03/08/2018   Procedure: CATARACT EXTRACTION PHACO AND INTRAOCULAR LENS PLACEMENT ([place]);  Surgeon: Birder Robson, MD;  Location: [facility] ORS;  Service:  Ophthalmology;  Laterality: Left;  [place] 00:55 AP% 13.5 CDE 7.52 Fluid pack lot # [id number]   CATARACT EXTRACTION W/PHACO Right 04/05/2018   Procedure: CATARACT EXTRACTION PHACO AND INTRAOCULAR LENS PLACEMENT (I[facility]);  Surgeon: Birder Robson, MD;  Location: [facility] ORS;  Service: Ophthalmology;  Laterality: Right;  [place] 00:36 AP% 15.9 CDE 5.72 Fluid pack lot # [id number]   CEREBRAL ANEURYSM REPAIR     CHOLECYSTECTOMY     COLONOSCOPY WITH PROPOFOL N/A 04/26/2019   Procedure: COLONOSCOPY WITH PROPOFOL;  Surgeon: Jonathon Bellows, MD;  Location: [facility] ENDOSCOPY;  Service: Gastroenterology;  Laterality: N/A;   PORTACATH PLACEMENT Left 08/05/2020   Procedure: INSERTION PORT-A-CATH;  Surgeon: Herbert Pun, MD;  Location: [facility] ORS;  Service: General;  Laterality: Left;   TUBAL LIGATION      SOCIAL HISTORY: Social History   Socioeconomic History   Marital status: Divorced    Spouse name: Not on file   Number of children: Not on file   Years of education: 16   Highest education level: Bachelor's degree (e.g., BA, AB, BS)  Occupational History   Occupation: farmer  Tobacco Use   Smoking status: Every Day    Packs/day: 0.25    Years: 50.00    Pack years: 12.50    Types: Cigarettes   Smokeless tobacco: Never  Vaping Use   Vaping Use: Never used  Substance and Sexual Activity   Alcohol use: No   Drug use: No   Sexual activity: Not Currently  Other Topics Concern   Not on file  Social History Narrative   Pt lives at [facility].    Social Determinants of Health   Financial Resource Strain: Not on file  Food Insecurity: Not on file  Transportation Needs: Not on file  Physical Activity: Not on file  Stress: Not on file  Social Connections: Not on file  Intimate Partner Violence: Not on file    FAMILY HISTORY: Family History  Problem Relation Age of Onset   Hypertension Mother    Heart failure Mother    Hypertension Brother    Stroke Brother    Hypertension Son     Emphysema Maternal Aunt    Hypertension Paternal Aunt    Hypertension Paternal Uncle    Hypertension Maternal Grandmother    Hypertension Paternal Grandmother     ALLERGIES:  is allergic to naproxen, aspirin, belladonna alkaloids, fluoxetine, fluoxetine hcl, paroxetine hcl, phenobarbital, and prozac [fluoxetine hcl].  MEDICATIONS:  Current Outpatient Medications  Medication Sig Dispense Refill   ADVAIR HFA 45-21 MCG/ACT inhaler INHALE 2 PUFFS INTO THE LUNGS TWICE DAILY 12 g 6   albuterol (VENTOLIN HFA) 108 (90 Base) MCG/ACT inhaler Inhale 2 puffs into the lungs every 6 (six) hours as needed for wheezing or shortness of breath. 8 g 2   apixaban (ELIQUIS) 5 MG TABS tablet Take 1 tablet (5 mg total) by mouth 2 (two) times daily. 180 tablet 3   chlorhexidine (PERIDEX) 0.12 % solution Use as directed 15 mLs in the mouth or throat 2 (two) times daily.  473 mL 6   docusate sodium (COLACE) 100 MG capsule Take 1 capsule (100 mg total) by mouth daily. 30 capsule 1   feeding supplement, ENSURE ENLIVE, (ENSURE ENLIVE) LIQD Take 237 mLs by mouth 3 (three) times daily between meals. 237 mL 12   gabapentin (NEURONTIN) 300 MG capsule Take 2 capsules (600 mg total) by mouth 3 (three) times daily. 180 capsule 1   lidocaine-prilocaine (EMLA) cream Apply 1 application topically as needed [facility]). Apply to affected area once 30 g 2   magnesium chloride (SLOW-MAG) 64 MG TBEC SR tablet Take 1 tablet (64 mg total) by mouth 2 (two) times daily. 60 tablet 1   megestrol (MEGACE ES) 625 MG/5ML suspension Take 5 mLs (625 mg total) by mouth daily. 150 mL 1   Melatonin 10 MG TABS Take 10 mg by mouth at bedtime as needed (sleep).     metoprolol tartrate (LOPRESSOR) 25 MG tablet Take 0.5 tablets (12.5 mg total) by mouth 2 (two) times daily. 45 tablet 2   Multiple Vitamins-Minerals (CENTRUM SILVER PO) Take 1 tablet by mouth daily. Gummie     ondansetron (ZOFRAN-ODT) 8 MG disintegrating tablet Take 1 tablet (8 mg total) by  mouth every 8 (eight) hours as needed for nausea or vomiting. 90 tablet 0   oxcarbazepine (TRILEPTAL) 600 MG tablet Take 1 tablet (600 mg total) by mouth 2 (two) times daily. 60 tablet 3   polyethylene glycol powder (GLYCOLAX/MIRALAX) 17 GM/SCOOP powder Take 17 g by mouth daily as needed for mild constipation or moderate constipation. 255 g 0   potassium chloride SA (KLOR-CON) 20 MEQ tablet Take 2 tablets (40 mEq total) by mouth daily. 60 tablet 1   prochlorperazine (COMPAZINE) 10 MG tablet Take 1 tablet (10 mg total) by mouth every 6 (six) hours as needed (Nausea or vomiting). 30 tablet 1   traZODone (DESYREL) 50 MG tablet Take 1-2 tablets (50-100 mg total) by mouth at bedtime as needed for sleep. 60 tablet 2   amiodarone (PACERONE) 200 MG tablet Take 1 tablet (200 mg total) by mouth daily. Hold this medication until you see your cardiologist and/or pulmonologist (Patient not taking: Reported on 10/07/2021) 90 tablet 3   fentaNYL (DURAGESIC) 100 MCG/HR Place 1 patch onto the skin every 3 (three) days. 10 patch 0   furosemide (LASIX) 20 MG tablet Take 1 tablet (20 mg total) by mouth daily as needed (for lower extremity swelling). (Patient not taking: Reported on 04/14/2021) 90 tablet 1   oxycodone (ROXICODONE) 30 MG immediate release tablet Take 1 tablet (30 mg total) by mouth every 4 (four) hours as needed for pain. 90 tablet 0   predniSONE (DELTASONE) 50 MG tablet 50 mg daily x 7 days, 40 mg daily x 7 days, 30 mg daily x 7 days, 20 mg daily x 7 days, & 10 mg daily x 7 days then stop (Patient not taking: Reported on 10/07/2021) 105 tablet 0   No current facility-administered medications for this visit.   Facility-Administered Medications Ordered in Other Visits  Medication Dose Route Frequency Provider Last Rate Last Admin   heparin lock flush 100 UNIT/ML injection            heparin lock flush 100 UNIT/ML injection            heparin lock flush 100 unit/mL  500 Units Intravenous Once Earlie Server, MD        heparin lock flush 100 unit/mL  500 Units Intracatheter Once PRN Earlie Server, MD  sodium chloride flush (NS) 0.9 % injection 10 mL  10 mL Intravenous PRN Earlie Server, MD   10 mL at 10/10/20 1040   sodium chloride flush (NS) 0.9 % injection 10 mL  10 mL Intravenous PRN Earlie Server, MD   10 mL at 11/13/20 0836   sodium chloride flush (NS) 0.9 % injection 10 mL  10 mL Intravenous PRN Earlie Server, MD   10 mL at 12/30/20 0838   sodium chloride flush (NS) 0.9 % injection 10 mL  10 mL Intravenous PRN Earlie Server, MD   10 mL at 01/14/21 0921   sodium chloride flush (NS) 0.9 % injection 10 mL  10 mL Intravenous PRN Earlie Server, MD   10 mL at 06/11/21 0815   sodium chloride flush (NS) 0.9 % injection 10 mL  10 mL Intravenous PRN Earlie Server, MD   10 mL at 07/09/21 0814     PHYSICAL EXAMINATION: ECOG PERFORMANCE STATUS: 3 - Symptomatic, >50% confined to bed Vitals:   10/07/21 1353  BP: 124/87  Pulse: (!) 105  Resp: 18  Temp: 98.2 F (36.8 C)  SpO2: 98%   Filed Weights   10/07/21 1353  Weight: 105 lb 11.2 oz (47.9 kg)    Physical Exam Constitutional:      General: She is not in acute distress.    Appearance: She is ill-appearing.     Comments: Frail  patient sits in the wheelchair  HENT:     Head: Normocephalic and atraumatic.     Mouth/Throat:     Comments: She has right palate deficiency Right facial swelling and tenderness. Eyes:     General: No scleral icterus.    Comments: Right eyelid ptosis  Cardiovascular:     Rate and Rhythm: Normal rate.     Heart sounds: Normal heart sounds.  Pulmonary:     Effort: Pulmonary effort is normal. No respiratory distress.     Breath sounds: No wheezing.     Comments: Decreased breath sound bilaterally. Abdominal:     General: Bowel sounds are normal. There is no distension.     Palpations: Abdomen is soft.  Musculoskeletal:        General: No deformity. Normal range of motion.     Cervical back: Normal range of motion and neck supple.  Skin:     General: Skin is warm and dry.     Findings: No erythema or rash.  Neurological:     Mental Status: She is alert and oriented to person, place, and time. Mental status is at baseline.     Cranial Nerves: No cranial nerve deficit.     Coordination: Coordination normal.  Psychiatric:        Mood and Affect: Mood normal.    LABORATORY DATA:  I have reviewed the data as listed Lab Results  Component Value Date   WBC 8.8 09/11/2021   HGB 12.4 09/11/2021   HCT 38.1 09/11/2021   MCV 92.3 09/11/2021   PLT 427 (H) 09/11/2021   Recent Labs    04/14/21 1317 04/23/21 0810 07/16/21 0909 07/23/21 0839 07/29/21 2030 07/31/21 0600 08/07/21 0420 08/08/21 0408 09/11/21 1641  NA 129*   < > 131* 130* 134*   < > 137 135 134*  K 4.5   < > 3.7 3.1* 3.2*   < > 3.7 3.5 3.5  CL 92*   < > 97* 93* 93*   < > 96* 96* 96*  CO2 29   < > 27 26  27   < > 35* 32 28  GLUCOSE 106*   < > 96 123* 103*   < > 85 89 98  BUN 19   < > 14 10 11    < > 27* 22 9  CREATININE 0.46   < > 0.42* 0.42* 0.46   < > 0.33* 0.34* 0.44  CALCIUM 9.0   < > 8.5* 8.5* 8.5*   < > 8.3* 8.3* 9.1  GFRNONAA >60   < > >60 >60 >60   < > >60 >60 >60  PROT 7.1   < > 7.0 7.2 7.4  --   --   --   --   ALBUMIN 3.1*   < > 3.3* 3.0* 2.6*  --   --   --   --   AST 15   < > 16 21 21   --   --   --   --   ALT 10   < > 12 18 21   --   --   --   --   ALKPHOS 115   < > 99 198* 216*  --   --   --   --   BILITOT <0.1*   < > 0.6 0.7 0.5  --   --   --   --   BILIDIR <0.1  --   --   --   --   --   --   --   --   IBILI NOT CALCULATED  --   --   --   --   --   --   --   --    < > = values in this interval not displayed.    Iron/TIBC/Ferritin/ %Sat    Component Value Date/Time   IRON 46 09/09/2020 0938   IRON 37 05/03/2018 1826   TIBC 210 (L) 09/09/2020 0938   FERRITIN 72 08/03/2021 1308   FERRITIN 52 05/03/2018 1826   IRONPCTSAT 22 09/09/2020 0938      RADIOGRAPHIC STUDIES: I have personally reviewed the radiological images as listed and agreed  with the findings in the report. DG Chest 2 View  Result Date: 09/11/2021 CLINICAL DATA:  Chest pain EXAM: CHEST - 2 VIEW COMPARISON:  08/07/2021, CT 07/30/2021 FINDINGS: Left-sided central venous port tip over the SVC. Improved aeration since 08/07/2021 with nearly resolved bilateral airspace opacities. Patchy residual opacities are noted and may reflect scarring or residual airspace disease. Underlying diffuse interstitial opacity which may be due to chronic change. Stable cardiomediastinal silhouette with aortic atherosclerosis. No pneumothorax. IMPRESSION: Patchy bilateral pulmonary opacities, significantly improved since 08/07/2021. No new abnormality is evident. Electronically Signed   By: Donavan Foil M.D.   On: 09/11/2021 17:39   CT Angio Chest PE W and/or Wo Contrast  Result Date: 09/11/2021 CLINICAL DATA:  Shortness of breath. EXAM: CT ANGIOGRAPHY CHEST WITH CONTRAST TECHNIQUE: Multidetector CT imaging of the chest was performed using the standard protocol during bolus administration of intravenous contrast. Multiplanar CT image reconstructions and MIPs were obtained to evaluate the vascular anatomy. CONTRAST:  15mL OMNIPAQUE IOHEXOL 350 MG/ML SOLN COMPARISON:  Chest radiograph dated 09/11/2021. CT dated 07/30/2021. FINDINGS: Cardiovascular: There is no cardiomegaly or pericardial effusion. Mild atherosclerotic calcification of the thoracic aorta. No aneurysmal dilatation or dissection. No pulmonary artery embolus identified. Left-sided Port-A-Cath in similar position. Mediastinum/Nodes: No hilar or mediastinal adenopathy. The esophagus is grossly unremarkable. No mediastinal fluid collection. Lungs/Pleura: Significant interval improvement in bilateral patchy and streaky ground-glass airspace opacities compared  to the CT of 07/30/2021. No pleural effusion pneumothorax. The central airways are patent. Upper Abdomen: Cholecystectomy. Musculoskeletal: Osteopenia with degenerative changes of the  spine. No acute osseous pathology. Fracture of the anterior superior L1 as seen on the prior CT. Review of the MIP images confirms the above findings. IMPRESSION: 1. No acute intrathoracic pathology. No CT evidence of pulmonary artery embolus. 2. Significant interval improvement in bilateral patchy and streaky ground-glass airspace opacities compared to the CT of 07/30/2021. 3. Aortic Atherosclerosis (ICD10-I70.0). Electronically Signed   By: Anner Crete M.D.   On: 09/11/2021 19:59       ASSESSMENT & PLAN:  1. 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[place])   2. History of atrial fibrillation   3. Moderate protein-calorie malnutrition ([place])   4. Neoplasm related pain   5. Dysphagia, unspecified type   6. Goals of care, counseling/discussion    #Maxillary sinus squamous cell carcinoma, s/p concurrent chemotherapy and radiation-recurrence-Unresectable  I had a lengthy discussion with the patient and her partner. Patient's performance status has significantly declined since her recent admission. She has had significant weight loss, very poor oral intake.  In addition she also has had paroxysmal A. fib episodes and has not followed up with cardiologist. Goals of care was discussed with patient.  She understands that her disease is not curable and the prognosis is poor. I recommend patient to continue nutrition supplementation, I will add Megace for appetite stimulant. Patient sees palliative care service for pain management and agree with the increase of her pain regimen. I will refer patient back to Dr. Baruch Gouty for additional radiation. I will also have a repeat MRI brain -trigeminal nerve protocol or facial protocol  for evaluation of disease status. If condition is able to be further improved, I think we can proceed with additional treatment with cetuximab +/- Keytruda. If she further declines, I think comfort care/hospice is very reasonable for her. Discussed with palliative care service Vonna Kotyk  #Atrial  fibrillation on amiodarone and metoprolol.continue Eliquis I encourage patient to make a follow-up appointment with cardiology.  All questions were answered. The patient knows to call the clinic with any problems questions or concerns.  Earlie Server, MD, PhD  10/07/2021

## 2021-10-08 ENCOUNTER — Telehealth: Payer: Self-pay

## 2021-10-08 ENCOUNTER — Encounter: Payer: Self-pay | Admitting: Oncology

## 2021-10-08 DIAGNOSIS — C760 Malignant neoplasm of head, face and neck: Secondary | ICD-10-CM

## 2021-10-08 MED ORDER — OXYCODONE HCL 30 MG PO TABS: 30.0000 mg | ORAL_TABLET | ORAL | 0 refills | Status: DC | PRN

## 2021-10-08 NOTE — Telephone Encounter (Signed)
Please arrange her to get MRI brain w and wo contrast (first available), please check with radiology if this can be done with trigeminal nerve protocol or facial protocol [ recommended by radiologist who read her last MRI brain].   Schedule MD/ palliative/ nutrition/ rad onc 2-3 days after MRI.   Recommend pt to make follow up appt with cardiology for ER follow up

## 2021-10-08 NOTE — Telephone Encounter (Signed)
-----   Message from Earlie Server, MD sent at 10/07/2021 10:08 PM EST ----- Please arrange her to get MRI brain w and wo contrast, please check with radiology if this can be done with trigeminal nerve protocol or facial protocol [ recommended by radiologist who read her last MRI brain].  Next available.  Follow up with me and palliative care service. MD only, after MRI, given at least 2 days after imaging.  Also please get her to see Radonc too after MRI  Need follow up appt with nutritionist.  Also recommend pt to make a follow up app with her cardiologist. [ She was sent to ER by card and ER discharge her] She needs a follow up

## 2021-10-10 ENCOUNTER — Encounter: Payer: Self-pay | Admitting: Licensed Clinical Social Worker

## 2021-10-10 NOTE — Progress Notes (Signed)
Clinical Social Work Progress Note  CSW spoke with Ms. Shannon Schroeder and discussed her financial concerns.  Due to Shannon Schroeder's difficulty in communicating, she requested this CSW contact her care giver Ms. Shannon Schroeder [phone].  CSW spoke with Shannon Schroeder, who stated Shannon Schroeder's main concern at this moment is her over due rent for the month of November, the total due is 650.00.  Shannon Schroeder stated they were able to pay the rent for December but Shannon Schroeder is concerned about covering other bills. Shannon Schroeder stated the patient did receive assistance from the [facility], and was told by the former CSW, the patient would not receive any more financial assistance. Shannon Schroeder could not remember which fund the assistance came from, or the amount.  Shannon Schroeder, stated she is Shannon Schroeder's only care giver and support system.  Shannon Schroeder stated she is unable to find consistent work because she needs to care for Shannon Schroeder. Shannon Schroeder stated she has contacted DSS about getting paid to be the patient's caregiver but stated the process was very complicated and time consuming.  CSW encouraged Shannon Schroeder to contact DSS and discuss the the application process to become a paid caregiver, and to also speak with DSS about emergency fund assistance.  CSW stated I would find out from which fund Shannon Schroeder received assistance and whether the patient would be abel to receive any other financial assistance from the [facility].  [place] stated  would email Shannon Schroeder to [email or web address], if I found utou any information on financial assistance programs.  Shannon Schroeder verbalized understanding.

## 2021-10-20 ENCOUNTER — Encounter: Payer: Self-pay | Admitting: Hospice and Palliative Medicine

## 2021-10-20 ENCOUNTER — Telehealth: Payer: Self-pay | Admitting: Oncology

## 2021-10-20 ENCOUNTER — Other Ambulatory Visit: Payer: Self-pay

## 2021-10-20 ENCOUNTER — Other Ambulatory Visit: Payer: Self-pay | Admitting: Cardiovascular Disease

## 2021-10-20 ENCOUNTER — Ambulatory Visit: Admission: RE | Admit: 2021-10-20 | Payer: Medicare Other | Source: Ambulatory Visit

## 2021-10-20 ENCOUNTER — Other Ambulatory Visit: Payer: Self-pay | Admitting: Emergency Medicine

## 2021-10-20 DIAGNOSIS — C310 Malignant neoplasm of maxillary sinus: Secondary | ICD-10-CM

## 2021-10-20 DIAGNOSIS — C760 Malignant neoplasm of head, face and neck: Secondary | ICD-10-CM

## 2021-10-20 MED ORDER — GABAPENTIN 300 MG PO CAPS: 600.0000 mg | ORAL_CAPSULE | Freq: Three times a day (TID) | ORAL | 1 refills | Status: AC

## 2021-10-20 NOTE — Telephone Encounter (Signed)
Attempted to contact pt to reschedule MRI and follow up appts.Left VM to call back. Will attempt again.

## 2021-10-20 NOTE — Telephone Encounter (Signed)
I forwarded to Eric Form, since she does PA's now. I believe she needs oxycodone refilled. She has 90 tabs refilled on 12/7 and if she is taking evey 4 hours as prescribed then it will be a 15 day supply. I will forward refill request to you.

## 2021-10-20 NOTE — Telephone Encounter (Signed)
Eliquis 5 mg refill request received. Patient is 65 years old, weight-47.9 kg, Crea- 0.44 on 09/11/21, Diagnosis-PAF, and last seen by Tarri Glenn, PA on 09/11/21. Dose is appropriate based on dosing criteria. Will send in refill to requested pharmacy.

## 2021-10-20 NOTE — Telephone Encounter (Signed)
Please review

## 2021-10-21 ENCOUNTER — Encounter: Payer: Self-pay | Admitting: Oncology

## 2021-10-21 MED ORDER — OXYCODONE HCL 30 MG PO TABS: 30.0000 mg | ORAL_TABLET | ORAL | 0 refills | Status: DC | PRN

## 2021-10-22 ENCOUNTER — Telehealth: Payer: Self-pay

## 2021-10-22 ENCOUNTER — Telehealth: Payer: Self-pay | Admitting: Oncology

## 2021-10-22 NOTE — Telephone Encounter (Signed)
Attempted to contact on 12/19 to reschedule appts from 12/22 to after MRI scheduled on 1/4. Attempted contact again today 12/21, left another VM and mychart message.

## 2021-10-22 NOTE — Telephone Encounter (Signed)
Pt no showed to previous MRI appt. MRI face has been r/s to 1/4. Please move appts to be 2-3 days after MRI and inform pt of new appts.

## 2021-10-23 ENCOUNTER — Inpatient Hospital Stay: Payer: Medicare Other

## 2021-10-23 ENCOUNTER — Inpatient Hospital Stay: Payer: Medicare Other | Admitting: Oncology

## 2021-10-23 ENCOUNTER — Inpatient Hospital Stay: Payer: Medicare Other | Admitting: Hospice and Palliative Medicine

## 2021-10-31 ENCOUNTER — Encounter: Payer: Self-pay | Admitting: Hospice and Palliative Medicine

## 2021-10-31 ENCOUNTER — Other Ambulatory Visit: Payer: Self-pay

## 2021-11-05 ENCOUNTER — Ambulatory Visit: Admission: RE | Admit: 2021-11-05 | Payer: Medicare Other | Source: Ambulatory Visit

## 2021-11-05 ENCOUNTER — Encounter: Payer: Self-pay | Admitting: Hospice and Palliative Medicine

## 2021-11-05 ENCOUNTER — Other Ambulatory Visit: Payer: Self-pay

## 2021-11-05 ENCOUNTER — Encounter: Payer: Self-pay | Admitting: Oncology

## 2021-11-05 MED ORDER — OXYCODONE HCL 30 MG PO TABS: 30.0000 mg | ORAL_TABLET | ORAL | 0 refills | Status: DC | PRN

## 2021-11-05 MED ORDER — FENTANYL 100 MCG/HR TD PT72: 1.0000 | MEDICATED_PATCH | TRANSDERMAL | 0 refills | Status: DC

## 2021-11-05 NOTE — Telephone Encounter (Signed)
Request was sent to Lauren last week, but they were refused

## 2021-11-05 NOTE — Telephone Encounter (Signed)
Please r/s MRI and MD to be 2-3 weeks after MRI. Keep Josh and dietitian appt as scheduled.

## 2021-11-05 NOTE — Telephone Encounter (Signed)
Patient has sent several Mychart messages regarding refill.

## 2021-11-05 NOTE — Telephone Encounter (Signed)
Medcation has been pended

## 2021-11-07 ENCOUNTER — Inpatient Hospital Stay: Payer: Medicare Other | Admitting: Oncology

## 2021-11-07 ENCOUNTER — Other Ambulatory Visit: Payer: Self-pay

## 2021-11-07 ENCOUNTER — Inpatient Hospital Stay: Payer: Medicare Other | Attending: Hospice and Palliative Medicine | Admitting: Hospice and Palliative Medicine

## 2021-11-07 ENCOUNTER — Inpatient Hospital Stay: Payer: Medicare Other | Admitting: Hospice and Palliative Medicine

## 2021-11-07 ENCOUNTER — Inpatient Hospital Stay: Payer: Medicare Other

## 2021-11-07 DIAGNOSIS — C760 Malignant neoplasm of head, face and neck: Secondary | ICD-10-CM

## 2021-11-07 NOTE — Progress Notes (Signed)
Nutrition Follow-up:  Patient with head and neck cancer, recurrence. Followed by Dr Tasia Catchings and Palliative care.  Planning MRI.  Met with patient via virtual visit.  Patient reports that appetite is so-so.  Started megace yesterday as delay in getting medication.  All foods are pureed at this time.  Has been eating pancakes, soups, steak fajita, eggs, potatoes, cream of wheat.  Drinking 1 ensure but aims for 3-4 per day. Says that it is causing her diarrhea again.      Medications: reviewed  Labs: reviewed  Anthropometrics:   Weight 105 lb 11.2 oz on 12/6  118 lb on 9/7 112 lb on 7/13 108 lb on 6/13 105 lb on 2/2 111 lb on 1/24   NUTRITION DIAGNOSIS: Inadequate oral intake decreased   INTERVENTION:  RD has offered patient alternative shakes in the past to try but did not like them Reviewed ways to add calories and protein in puree foods.  Continue appetite stimulant    MONITORING, EVALUATION, GOAL: weight trends, intake   NEXT VISIT: to be determined  Shannon Schroeder, [place], [place] Registered Dietitian [phone] (mobile)

## 2021-11-07 NOTE — Progress Notes (Signed)
I spoke with patient by phone.  She reports doing reasonably well and denies significant changes or concerns.  She reports pain is stable on current regimen.  Note that communication with her is somewhat impaired by her difficulty speaking.  We will plan follow-up when she returns to clinic on the 18th.

## 2021-11-14 ENCOUNTER — Encounter: Payer: Self-pay | Admitting: Oncology

## 2021-11-17 ENCOUNTER — Other Ambulatory Visit: Payer: Self-pay

## 2021-11-17 ENCOUNTER — Encounter: Payer: Self-pay | Admitting: Hospice and Palliative Medicine

## 2021-11-17 ENCOUNTER — Ambulatory Visit: Admission: RE | Admit: 2021-11-17 | Payer: Medicare Other | Source: Ambulatory Visit

## 2021-11-17 NOTE — Telephone Encounter (Addendum)
Aby please switch visit with Josh to Mychart visit and please cancel appt with Dr. Tasia Catchings , since it looks like she wont be coming. Will wait for Dr. Tasia Catchings to give [place] r/s instructions.

## 2021-11-17 NOTE — Telephone Encounter (Signed)
Josh: will sent refill request for Oxy to your box. Please advise on medication for breakthrough pain , per pt request. Should we switch your visit to Mychart/ phone visit?   Dr. Tasia Catchings, pt has no showed to MRI x3. She has follow up with you on 1/18 as well for MRI resutls. Please advise on f/u?

## 2021-11-18 ENCOUNTER — Encounter: Payer: Self-pay | Admitting: Oncology

## 2021-11-18 MED ORDER — OXYCODONE HCL 30 MG PO TABS: 30.0000 mg | ORAL_TABLET | ORAL | 0 refills | Status: DC | PRN

## 2021-11-19 ENCOUNTER — Inpatient Hospital Stay: Payer: Medicare Other | Attending: Hospice and Palliative Medicine | Admitting: Hospice and Palliative Medicine

## 2021-11-19 ENCOUNTER — Inpatient Hospital Stay: Payer: Medicare Other | Admitting: Hospice and Palliative Medicine

## 2021-11-19 ENCOUNTER — Inpatient Hospital Stay: Payer: Medicare Other | Admitting: Oncology

## 2021-11-19 DIAGNOSIS — Z515 Encounter for palliative care: Secondary | ICD-10-CM | POA: Diagnosis not present

## 2021-11-19 DIAGNOSIS — C310 Malignant neoplasm of maxillary sinus: Secondary | ICD-10-CM | POA: Diagnosis not present

## 2021-11-19 DIAGNOSIS — C760 Malignant neoplasm of head, face and neck: Secondary | ICD-10-CM

## 2021-11-19 DIAGNOSIS — Z79899 Other long term (current) drug therapy: Secondary | ICD-10-CM

## 2021-11-19 DIAGNOSIS — Z79891 Long term (current) use of opiate analgesic: Secondary | ICD-10-CM

## 2021-11-19 DIAGNOSIS — G893 Neoplasm related pain (acute) (chronic): Secondary | ICD-10-CM

## 2021-11-19 MED ORDER — OXCARBAZEPINE 600 MG PO TABS: 600.0000 mg | ORAL_TABLET | Freq: Two times a day (BID) | ORAL | 3 refills | Status: AC

## 2021-11-19 NOTE — Progress Notes (Signed)
Virtual Visit via Video Note  I connected with Shannon Schroeder on 11/19/21 at  2:30 PM EST by a video enabled telemedicine application and verified that I am speaking with the correct person using two identifiers.  Location: Patient: Home Provider: Clinic   I discussed the limitations of evaluation and management by telemedicine and the availability of in person appointments. The patient expressed understanding and agreed to proceed.  History of Present Illness: Shannon Schroeder is a 66 y.o. 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  Patient was hospitalized 07/30/2021-08/08/2021 with multifocal pneumonia versus possible drug-induced pneumonitis.  Patient has had severe facial pain.  She was referred to palliative care to help address goals and manage ongoing symptoms.   Observations/Objective: Patient is missed scheduled MRI x3.  She last saw Dr. Tasia Catchings on 10/07/2021 at which time she was noted to have declining performance status and option of hospice was discussed.  I spoke with patient today virtually.  Patient reports that she wants to "hold off" on the MRI at this time.  She says she is more inclined to just stay home and focus on comfort.  I strongly encouraged hospice involvement to achieve this goal but patient stated that she wanted to take time to think about it and that she did not think that she needed their services yet.  Symptomatically, she continues to endorse facial pain.  She does find that the fentanyl and oxycodone are helpful but she is taking oxycodone every 4 hours around-the-clock.  She is also taking Tylenol around-the-clock.  She asked for something "nonnarcotic" to lessen her need to take frequent Tylenol.  Of note, she is still taking gabapentin.  Patient reports that she had some improvement with Trileptal prescribed by Dr. Mickeal Skinner but found it difficult to swallow so she  discontinued it.  She is interested in restarting the Trileptal and will speak with her pharmacist to see if it is okay to crush for administration.  Assessment and Plan: Maxillary cancer -overall declining performance status.  She wants to hold off on further imaging for now.  Strongly recommended hospice but patient wants to take time to think about it.  We will plan to follow-up with her virtually in 2 weeks.  If she is still reluctant to have hospice, I would recommend home palliative care involvement.  Neoplasm related pain -continue fentanyl/oxycodone/gabapentin.  Restart Trileptal.  Follow Up Instructions: Follow-up MyChart visit 2 weeks   I discussed the assessment and treatment plan with the patient. The patient was provided an opportunity to ask questions and all were answered. The patient agreed with the plan and demonstrated an understanding of the instructions.   The patient was advised to call back or seek an in-person evaluation if the symptoms worsen or if the condition fails to improve as anticipated.  I provided 15 minutes of non-face-to-face time during this encounter.   Irean Hong, NP

## 2021-11-20 ENCOUNTER — Encounter: Payer: Self-pay | Admitting: Hospice and Palliative Medicine

## 2021-11-21 ENCOUNTER — Encounter: Payer: Self-pay | Admitting: Licensed Clinical Social Worker

## 2021-11-21 NOTE — Telephone Encounter (Signed)
Ok, thank you for the update.

## 2021-11-21 NOTE — Progress Notes (Signed)
[place] CSW Progress Note  Clinical Education officer, museum contacted caregiver by phone to discuss financial concerns due to eviction.  CSW received an update from [facility]. The patient sent her an email stating she and her daughter were about to be evicted from their home.  CSW spoke with the patient's main care giver Alda Berthold.  Ms. Allayne Stack and I spoke on December 9th, 2022, and spoke about their financial situation and what assistance [facility] is able to offer.  In December CSW requested Ms. Farias contact DSS for financial assistance and to discuss her applying as the patient's care giver with Medicaid so she could receive a wage, since the patient has Medicaid, and to also request a personal care service assistance for the patient. CSW also recommended Ms. Allayne Stack speak with the patient about palliative and/or hospice services, and for Ms. 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 During today's conversation Ms, Allayne Stack stated she had contacted DSS and they told her they would not be able to assist with rent, and have assisted with fuel costs.  However she stated she called "a couple of times and left a voicemail" but has not called more than that to enquire about become a paid caregiver or to request a personal care assistance. Ms. Allayne Stack stated she has also not found work because of the patient's care needs and due to her record.  Ms. Allayne Stack did state she is currently applying to different positions. CSW asked if the patient has contacted palliative care or hospice to assist with patient care, but Ms. Allayne Stack stated the patient did not want hospice involvement, "she doesn't want people going in and out of the house."   CSW requested Ms. Farias contact DSS to inquire about applying as patients care giver and to rquest a personal care assistant.  Ms. Allayne Stack agreed and stated she would contact DSS.  CSW recommended Ms. Farias contact DSS daily until she she receives a return call.  Ms.  Allayne Stack verbalized understanding.  CSW stated I would look into what if any emergency financial resources were available and I told Ms. Allayne Stack I would contact there again this afternoon. CSW did request Ms. Allayne Stack begin planning in case they were not able to pay their back rent and were evicted.  CSW also requested ms,. Farias contact the [employer] and request the estimated eviction date.  Ms. Allayne Stack verbalized understanding.    Adelene Amas , LCSW

## 2021-11-21 NOTE — Progress Notes (Signed)
CSW sent email referral to Lattie Haw and Sharyn Lull at [facility] for financial assistance. CSW made APS report to [place] due to care concerns.

## 2021-11-25 ENCOUNTER — Emergency Department: Payer: Medicare Other

## 2021-11-25 ENCOUNTER — Encounter: Payer: Self-pay | Admitting: Internal Medicine

## 2021-11-25 ENCOUNTER — Inpatient Hospital Stay
Admission: EM | Admit: 2021-11-25 | Discharge: 2021-11-28 | DRG: 872 | Disposition: A | Discharge status: Home / Self Care | Payer: Medicare Other | Attending: Internal Medicine | Admitting: Internal Medicine

## 2021-11-25 ENCOUNTER — Inpatient Hospital Stay: Payer: Medicare Other

## 2021-11-25 ENCOUNTER — Other Ambulatory Visit: Payer: Self-pay

## 2021-11-25 DIAGNOSIS — M1990 Unspecified osteoarthritis, unspecified site: Secondary | ICD-10-CM | POA: Diagnosis present

## 2021-11-25 DIAGNOSIS — Z90710 Acquired absence of both cervix and uterus: Secondary | ICD-10-CM

## 2021-11-25 DIAGNOSIS — Z886 Allergy status to analgesic agent status: Secondary | ICD-10-CM

## 2021-11-25 DIAGNOSIS — G893 Neoplasm related pain (acute) (chronic): Secondary | ICD-10-CM | POA: Diagnosis present

## 2021-11-25 DIAGNOSIS — Z23 Encounter for immunization: Secondary | ICD-10-CM | POA: Diagnosis present

## 2021-11-25 DIAGNOSIS — Z720 Tobacco use: Secondary | ICD-10-CM | POA: Diagnosis present

## 2021-11-25 DIAGNOSIS — I4891 Unspecified atrial fibrillation: Secondary | ICD-10-CM | POA: Diagnosis present

## 2021-11-25 DIAGNOSIS — W19XXXA Unspecified fall, initial encounter: Secondary | ICD-10-CM

## 2021-11-25 DIAGNOSIS — R1310 Dysphagia, unspecified: Secondary | ICD-10-CM | POA: Diagnosis present

## 2021-11-25 DIAGNOSIS — Z8673 Personal history of transient ischemic attack (TIA), and cerebral infarction without residual deficits: Secondary | ICD-10-CM | POA: Diagnosis not present

## 2021-11-25 DIAGNOSIS — J449 Chronic obstructive pulmonary disease, unspecified: Secondary | ICD-10-CM | POA: Diagnosis present

## 2021-11-25 DIAGNOSIS — C310 Malignant neoplasm of maxillary sinus: Secondary | ICD-10-CM | POA: Diagnosis present

## 2021-11-25 DIAGNOSIS — D638 Anemia in other chronic diseases classified elsewhere: Secondary | ICD-10-CM | POA: Diagnosis present

## 2021-11-25 DIAGNOSIS — Z681 Body mass index (BMI) 19 or less, adult: Secondary | ICD-10-CM | POA: Diagnosis not present

## 2021-11-25 DIAGNOSIS — E876 Hypokalemia: Secondary | ICD-10-CM | POA: Diagnosis present

## 2021-11-25 DIAGNOSIS — I480 Paroxysmal atrial fibrillation: Secondary | ICD-10-CM | POA: Diagnosis present

## 2021-11-25 DIAGNOSIS — Z79899 Other long term (current) drug therapy: Secondary | ICD-10-CM

## 2021-11-25 DIAGNOSIS — E872 Acidosis, unspecified: Secondary | ICD-10-CM | POA: Diagnosis present

## 2021-11-25 DIAGNOSIS — R471 Dysarthria and anarthria: Secondary | ICD-10-CM | POA: Diagnosis present

## 2021-11-25 DIAGNOSIS — Z888 Allergy status to other drugs, medicaments and biological substances status: Secondary | ICD-10-CM | POA: Diagnosis not present

## 2021-11-25 DIAGNOSIS — Z20822 Contact with and (suspected) exposure to covid-19: Secondary | ICD-10-CM | POA: Diagnosis present

## 2021-11-25 DIAGNOSIS — N390 Urinary tract infection, site not specified: Secondary | ICD-10-CM | POA: Diagnosis present

## 2021-11-25 DIAGNOSIS — W06XXXA Fall from bed, initial encounter: Secondary | ICD-10-CM | POA: Diagnosis present

## 2021-11-25 DIAGNOSIS — Z9049 Acquired absence of other specified parts of digestive tract: Secondary | ICD-10-CM | POA: Diagnosis not present

## 2021-11-25 DIAGNOSIS — Z8249 Family history of ischemic heart disease and other diseases of the circulatory system: Secondary | ICD-10-CM

## 2021-11-25 DIAGNOSIS — F17210 Nicotine dependence, cigarettes, uncomplicated: Secondary | ICD-10-CM | POA: Diagnosis present

## 2021-11-25 DIAGNOSIS — E440 Moderate protein-calorie malnutrition: Secondary | ICD-10-CM | POA: Diagnosis present

## 2021-11-25 DIAGNOSIS — A419 Sepsis, unspecified organism: Secondary | ICD-10-CM | POA: Diagnosis present

## 2021-11-25 DIAGNOSIS — Z9221 Personal history of antineoplastic chemotherapy: Secondary | ICD-10-CM

## 2021-11-25 DIAGNOSIS — Z515 Encounter for palliative care: Secondary | ICD-10-CM | POA: Diagnosis not present

## 2021-11-25 DIAGNOSIS — Z79891 Long term (current) use of opiate analgesic: Secondary | ICD-10-CM

## 2021-11-25 DIAGNOSIS — Z716 Tobacco abuse counseling: Secondary | ICD-10-CM

## 2021-11-25 DIAGNOSIS — C760 Malignant neoplasm of head, face and neck: Secondary | ICD-10-CM | POA: Diagnosis not present

## 2021-11-25 DIAGNOSIS — I10 Essential (primary) hypertension: Secondary | ICD-10-CM | POA: Diagnosis present

## 2021-11-25 DIAGNOSIS — Z79818 Long term (current) use of other agents affecting estrogen receptors and estrogen levels: Secondary | ICD-10-CM

## 2021-11-25 DIAGNOSIS — Z7901 Long term (current) use of anticoagulants: Secondary | ICD-10-CM

## 2021-11-25 LAB — COMPREHENSIVE METABOLIC PANEL
ALT: 9 U/L (ref 0–44)
AST: 20 U/L (ref 15–41)
Albumin: 2.2 g/dL — ABNORMAL LOW (ref 3.5–5.0)
Alkaline Phosphatase: 173 U/L — ABNORMAL HIGH (ref 38–126)
Anion gap: 11 (ref 5–15)
BUN: 15 mg/dL (ref 8–23)
CO2: 29 mmol/L (ref 22–32)
Calcium: 8.5 mg/dL — ABNORMAL LOW (ref 8.9–10.3)
Chloride: 94 mmol/L — ABNORMAL LOW (ref 98–111)
Creatinine, Ser: 0.39 mg/dL — ABNORMAL LOW (ref 0.44–1.00)
GFR, Estimated: >60 mL/min (ref >60)
Glucose, Bld: 123 mg/dL — ABNORMAL HIGH (ref 70–99)
Potassium: 3.3 mmol/L — ABNORMAL LOW (ref 3.5–5.1)
Sodium: 134 mmol/L — ABNORMAL LOW (ref 135–145)
Total Bilirubin: 0.8 mg/dL (ref 0.3–1.2)
Total Protein: 6.2 g/dL — ABNORMAL LOW (ref 6.5–8.1)

## 2021-11-25 LAB — URINALYSIS, COMPLETE (UACMP) WITH MICROSCOPIC
Bilirubin Urine: NEGATIVE
Glucose, UA: NEGATIVE mg/dL
Ketones, ur: NEGATIVE mg/dL
Leukocytes,Ua: NEGATIVE
Nitrite: POSITIVE — AB
Protein, ur: NEGATIVE mg/dL
Specific Gravity, Urine: 1.02 (ref 1.005–1.030)
Squamous Epithelial / HPF: NONE SEEN (ref 0–5)
pH: 7.5 (ref 5.0–8.0)

## 2021-11-25 LAB — TYPE AND SCREEN
ABO/RH(D): O POS
Antibody Screen: NEGATIVE

## 2021-11-25 LAB — PROTIME-INR
INR: 2.2 — ABNORMAL HIGH (ref 0.8–1.2)
Prothrombin Time: 24.2 seconds — ABNORMAL HIGH (ref 11.4–15.2)

## 2021-11-25 LAB — RESP PANEL BY RT-PCR (FLU A&B, COVID) ARPGX2
Influenza A by PCR: NEGATIVE
Influenza B by PCR: NEGATIVE
SARS Coronavirus 2 by RT PCR: NEGATIVE

## 2021-11-25 LAB — CBC
HCT: 30.2 % — ABNORMAL LOW (ref 36.0–46.0)
Hemoglobin: 9.9 g/dL — ABNORMAL LOW (ref 12.0–15.0)
MCH: 31.6 pg (ref 26.0–34.0)
MCHC: 32.8 g/dL (ref 30.0–36.0)
MCV: 96.5 fL (ref 80.0–100.0)
Platelets: 680 10*3/uL — ABNORMAL HIGH (ref 150–400)
RBC: 3.13 MIL/uL — ABNORMAL LOW (ref 3.87–5.11)
RDW: 18.4 % — ABNORMAL HIGH (ref 11.5–15.5)
WBC: 18.8 10*3/uL — ABNORMAL HIGH (ref 4.0–10.5)
nRBC: 0.1 % (ref 0.0–0.2)

## 2021-11-25 LAB — LACTIC ACID, PLASMA
Lactic Acid, Venous: 2.8 mmol/L (ref 0.5–1.9)
Lactic Acid, Venous: 2 mmol/L (ref 0.5–1.9)

## 2021-11-25 IMAGING — CT CT HEAD W/O CM
4 series · 16 of 47 positions shown, 18 images · non-contrast
Comparison: CT brain [DATE], MRI brain [DATE]; CTA head
[DATE] MRI brain [DATE]

CLINICAL DATA: Mental status change.  Unknown cause.



[Series 2: head wo · axial · 0.39mm/px · z∈[-128,-18]mm · 7 of 30 slices shown, 9 images]
[im 4/30  brain]
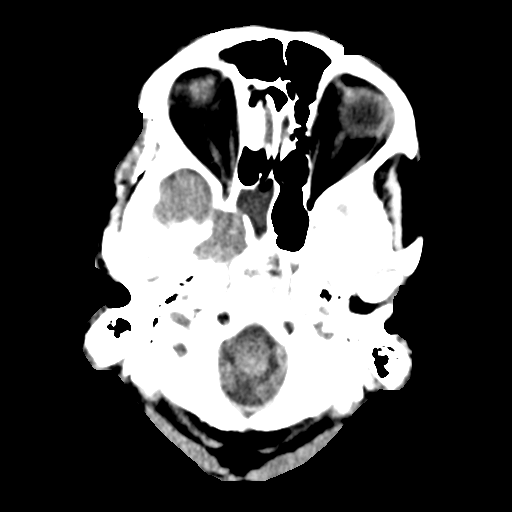
[im 4/30  bone]
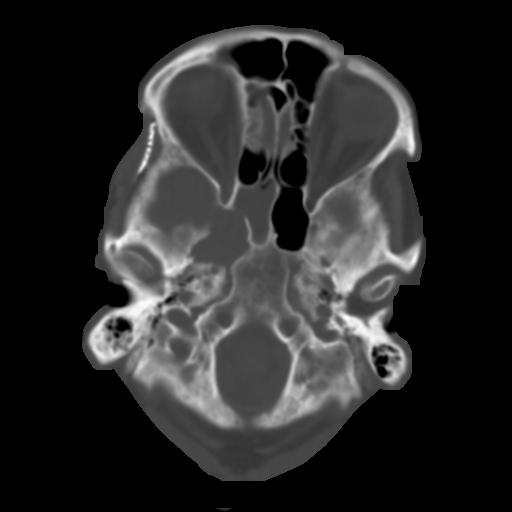
[im 8/30  brain]
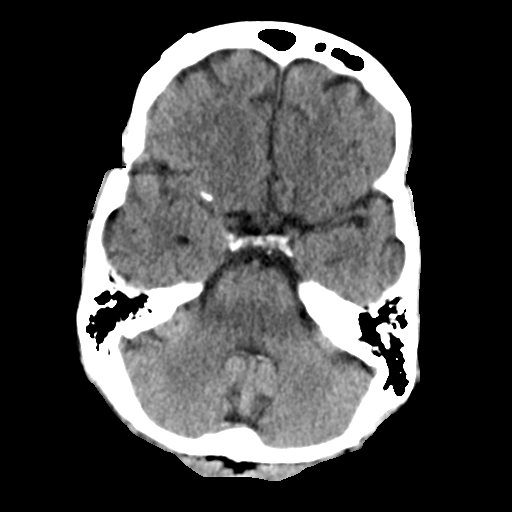
[im 11/30  brain]
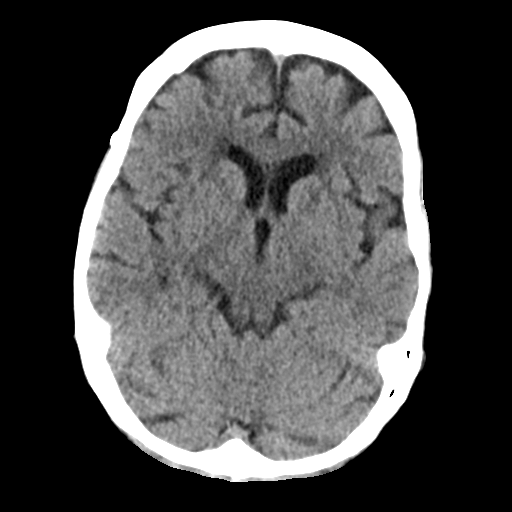
[im 15/30  brain]
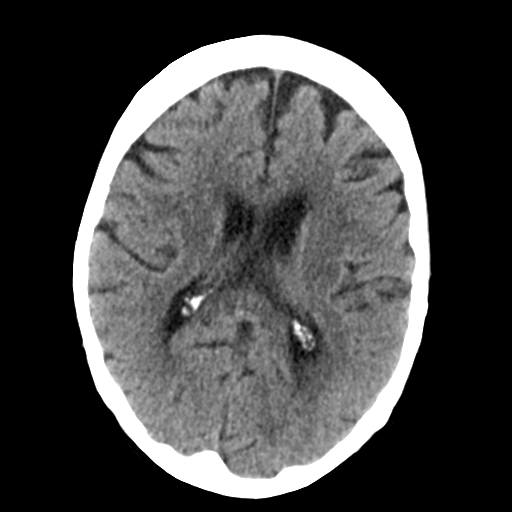
[im 19/30  brain]
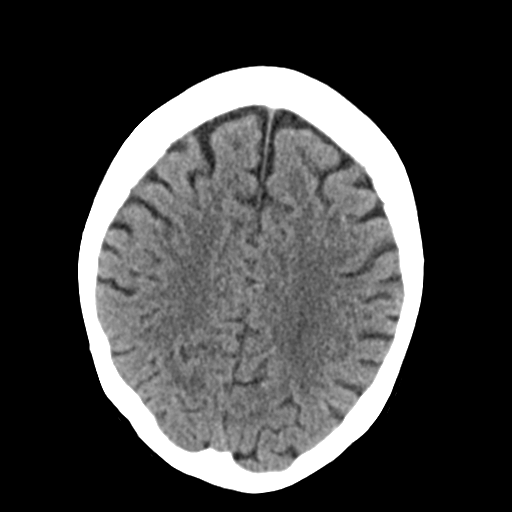
[im 19/30  bone]
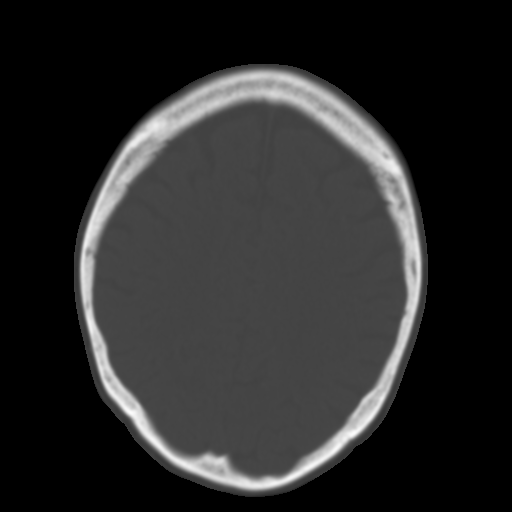
[im 22/30  brain]
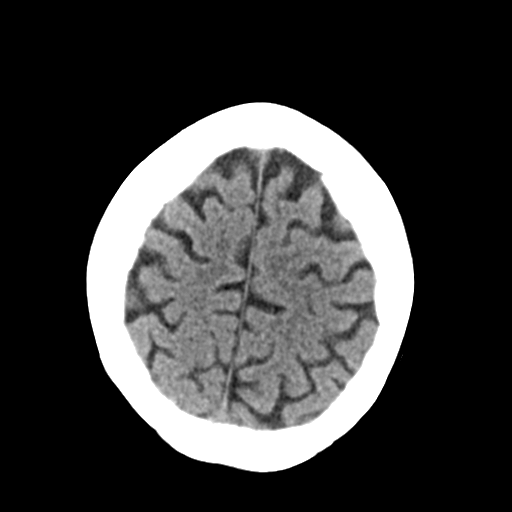
[im 26/30  brain]
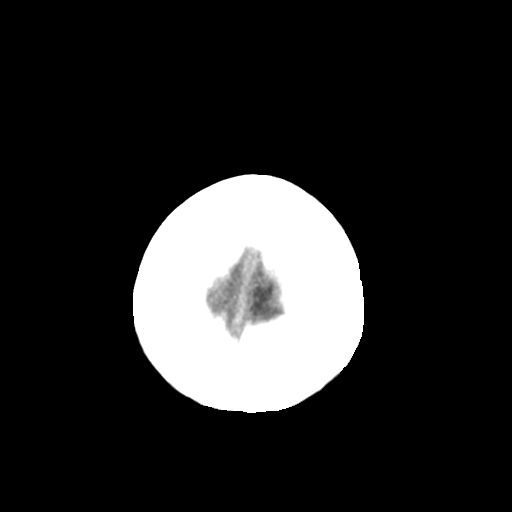

[Series 3: head bone · axial · 0.39mm/px · z∈[-129,-99]mm · 3 of 75 slices shown]
[im 8/75  bone]
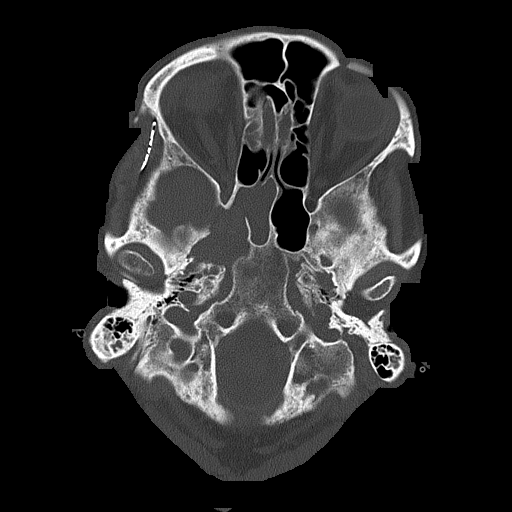
[im 15/75  bone]
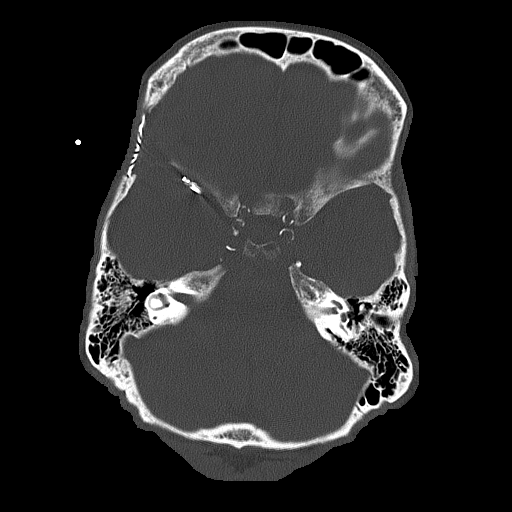
[im 23/75  bone]
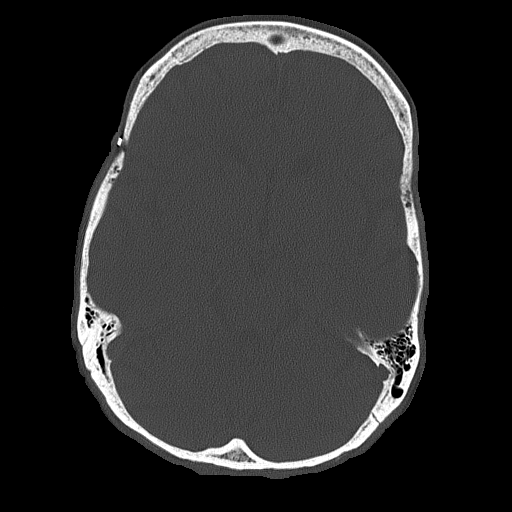

[Series 4: coronal soft tissue · coronal · 0.31mm/px · 3 of 65 slices shown]
[im 22/65  brain]
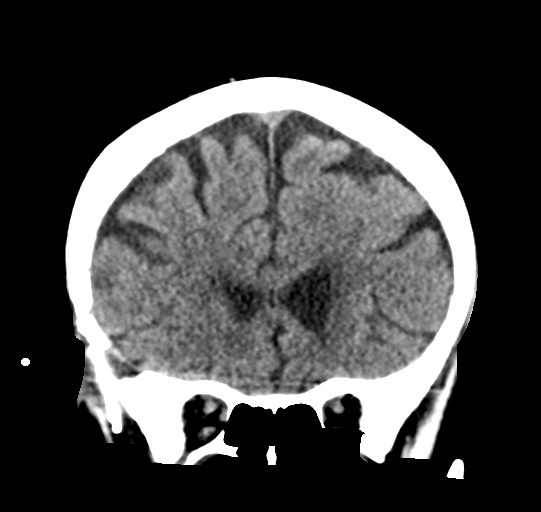
[im 29/65  brain]
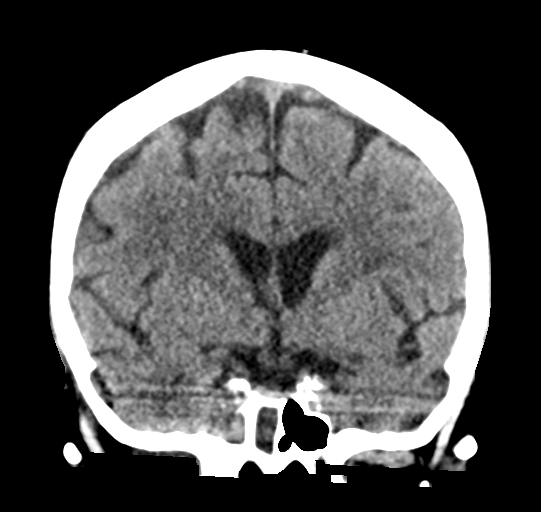
[im 36/65  brain]
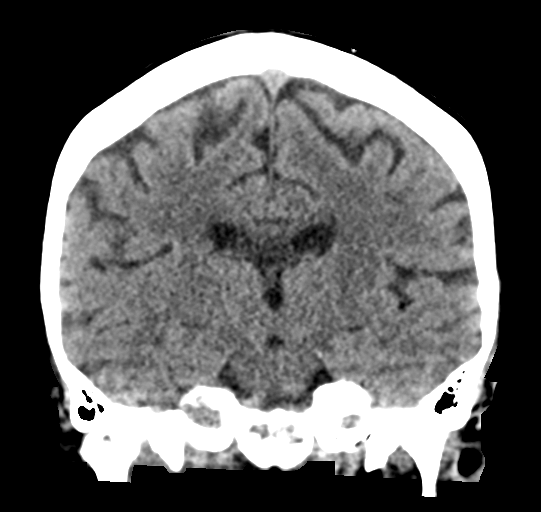

[Series 5: sagittal soft tissue · sagittal · 0.30mm/px · 3 of 55 slices shown]
[im 19/55  brain]
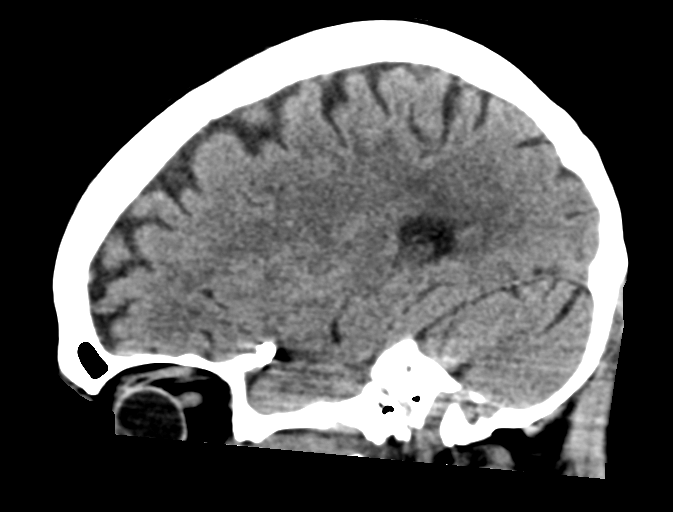
[im 28/55  brain]
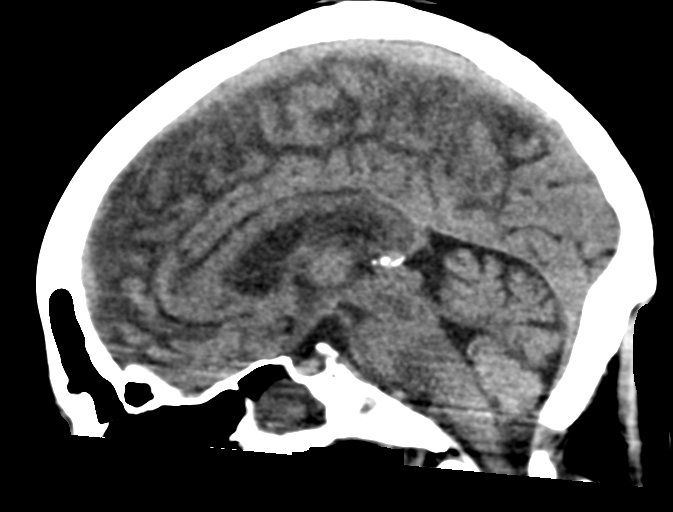
[im 37/55  brain]
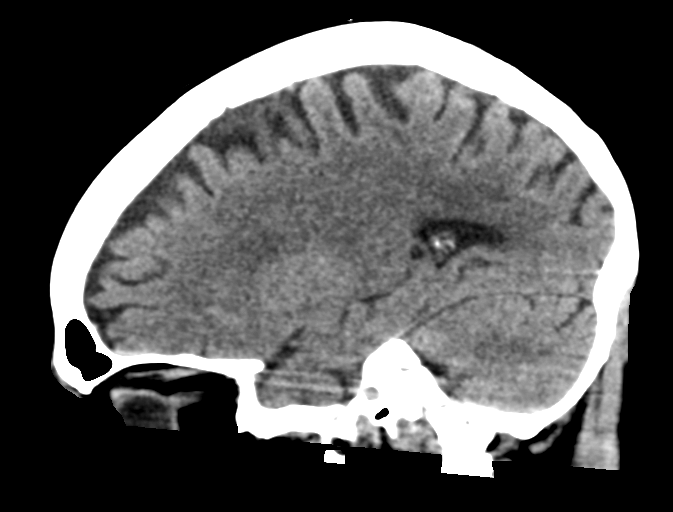

[16 of 47 positions shown; findings below may reference images not displayed]

FINDINGS: Brain: There is mild cortical atrophy, within normal limits for
patient age. The ventricles are normal in configuration. The basilar
cisterns are patent.

No mass, mass effect, or midline shift.

No acute intracranial hemorrhage is seen.

No abnormal extra-axial fluid collection.

Preservation of the normal cortical gray-white interface without CT
evidence of an acute major vascular territorial cortical based
infarction.

Vascular: No hyperdense vessel or unexpected calcification.

Skull: Normal. Negative for fracture or focal lesion.

Sinuses/Orbits: The visualized orbits are unremarkable. The
visualized paranasal sinuses and mastoid air cells are clear. There
is postsurgical change from a right frontal craniotomy likely for
intervention on the region of the distal right M1 aneurysm seen on
CTA head [DATE]. There are surgical clips in this region.

Other: None.
IMPRESSION: 1. Postsurgical changes of right frontal craniotomy for prior distal
right M1 aneurysm intervention.
2. No acute intracranial process.

## 2021-11-25 IMAGING — CT CT CERVICAL SPINE W/O CM
3 of 4 series · 12 of 35 positions shown, 14 images · non-contrast
Comparison: None.

CLINICAL DATA: Mental status change, unknown cause.



[Series 5: sagittal bone · sagittal · 0.30mm/px · 5 of 92 slices shown, 6 images]
[im 31/92  bone]
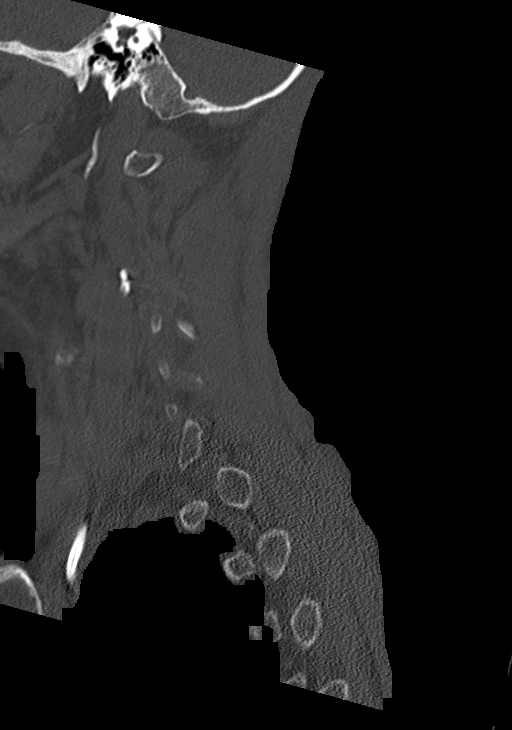
[im 38/92  bone]
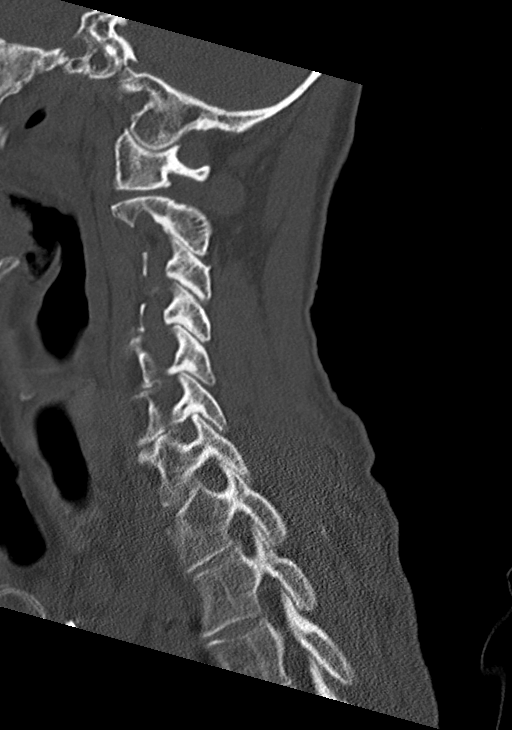
[im 46/92  soft-tissue]
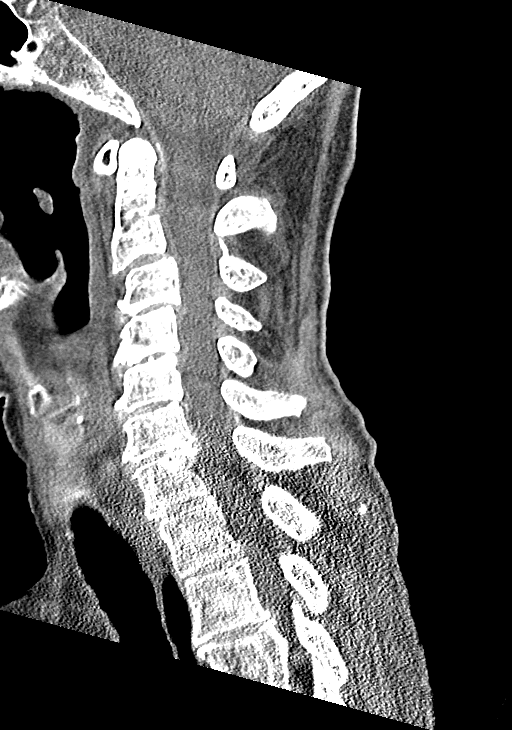
[im 46/92  bone]
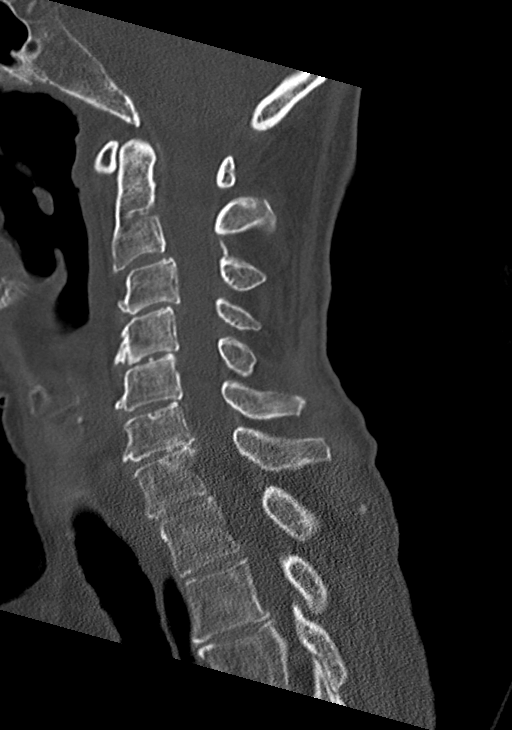
[im 54/92  bone]
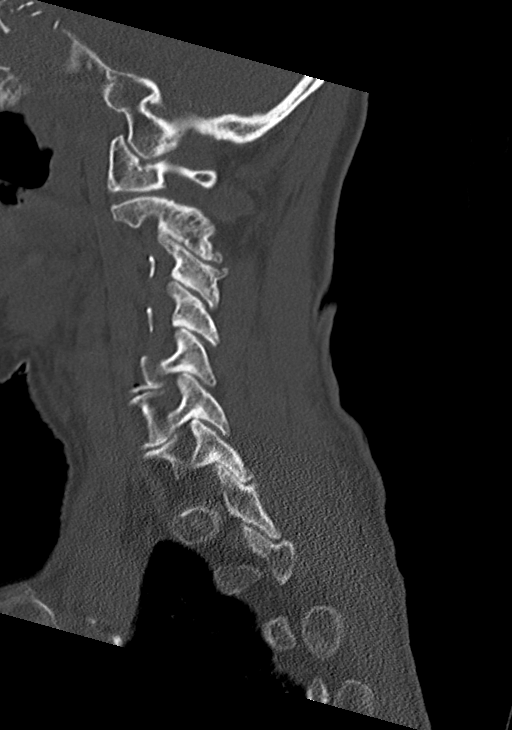
[im 61/92  bone]
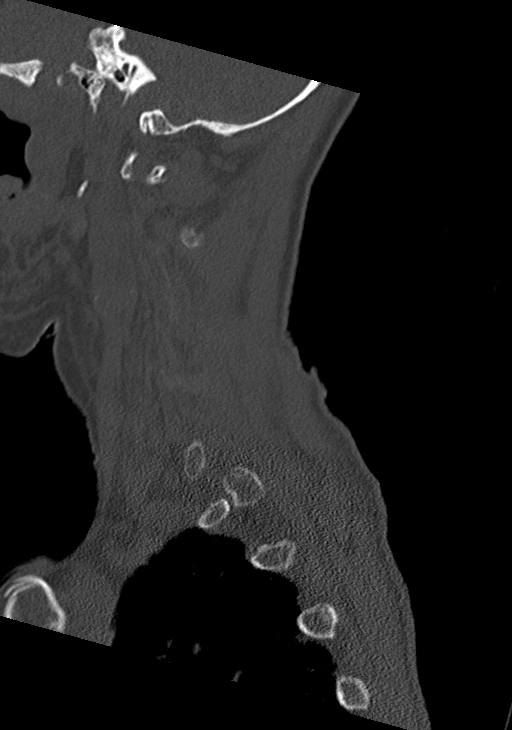

[Series 6: coronal bone · coronal · 0.36mm/px · 3 of 55 slices shown]
[im 11/55  bone]
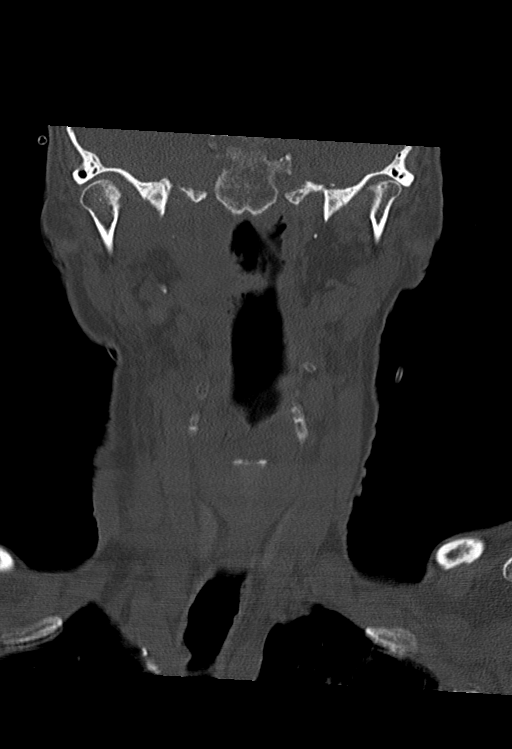
[im 22/55  bone]
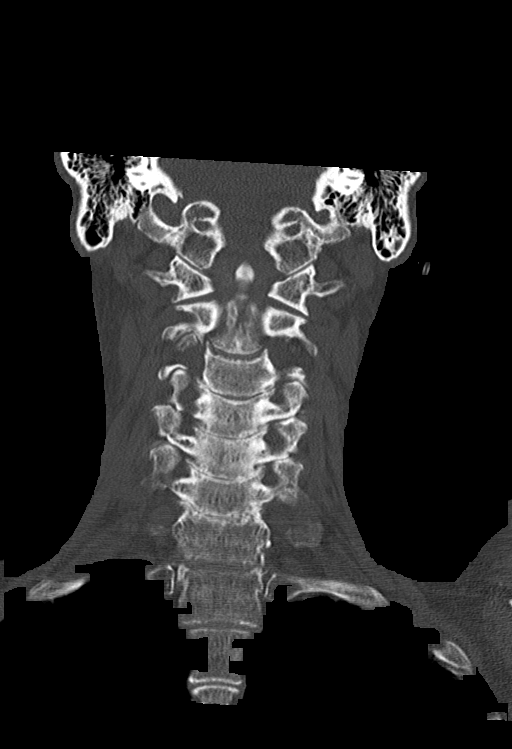
[im 33/55  bone]
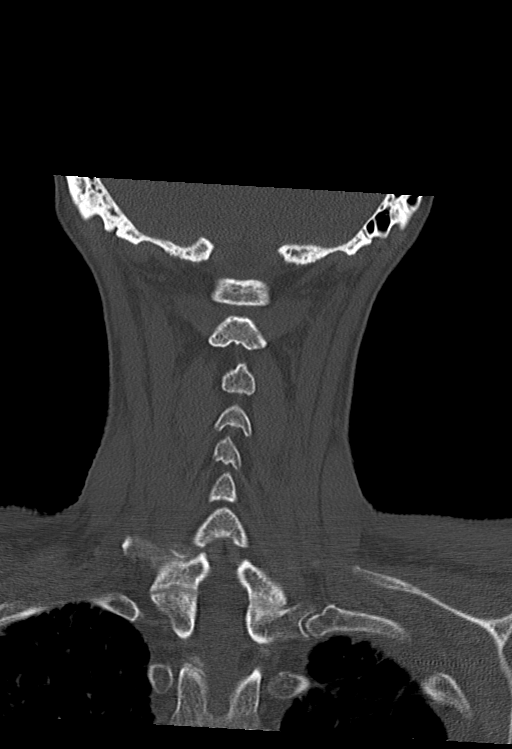

[Series 7: orthogonal axials · axial · 0.26mm/px · z∈[-298,-156]mm · 4 of 108 slices shown, 5 images]
[im 16/108  soft-tissue]
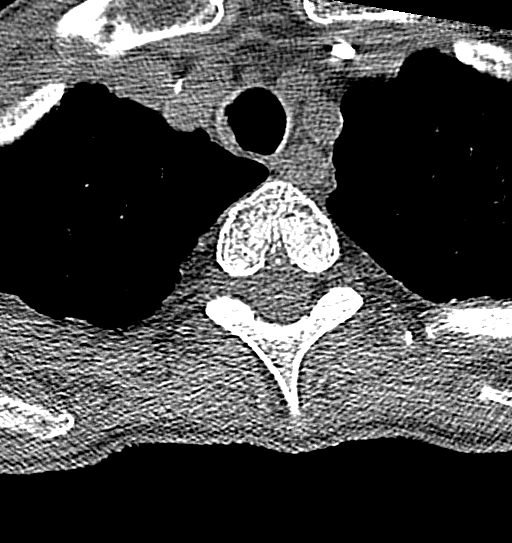
[im 16/108  bone]
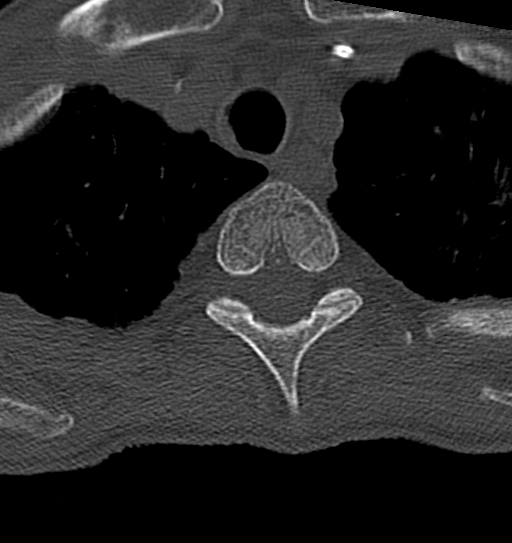
[im 46/108  bone]
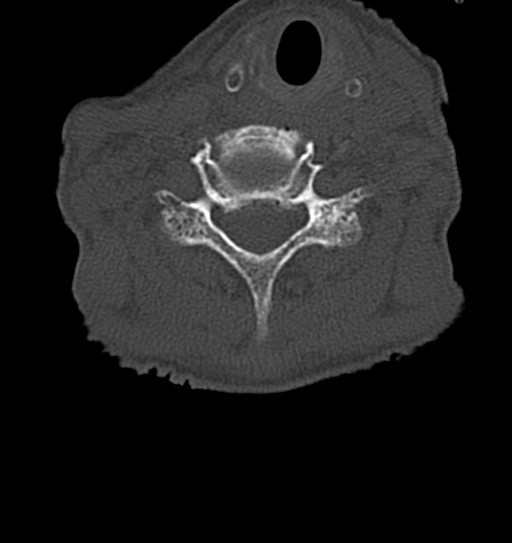
[im 62/108  bone]
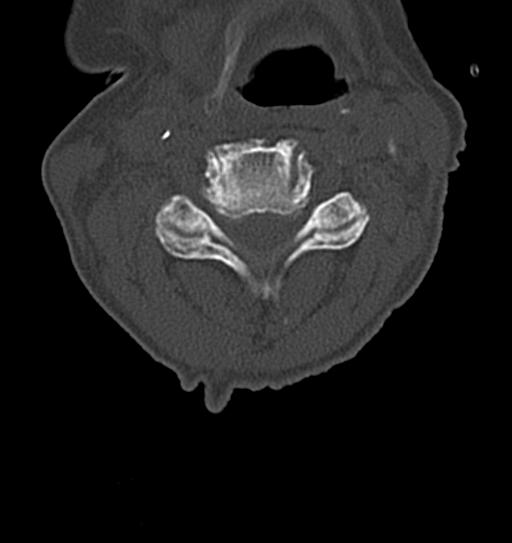
[im 92/108  bone]
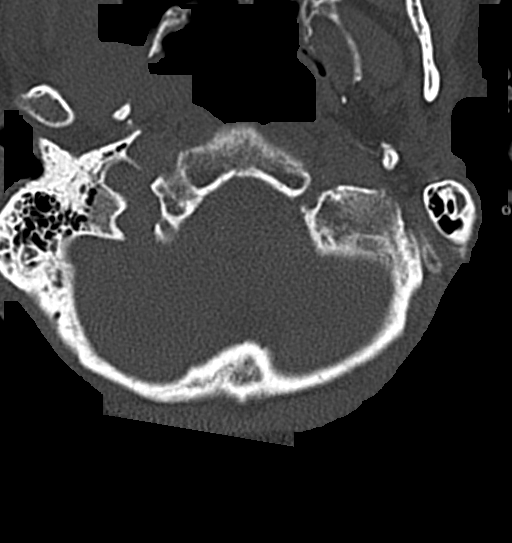

[12 of 35 positions shown; findings below may reference images not displayed]

FINDINGS: Alignment: There is 3 mm grade 1 anterolisthesis of C2 on C3, 3 mm
retrolisthesis of C3 on C4, 2 mm retrolisthesis of C4 on C5, 2 mm
retrolisthesis of C5 on C6, and 2 mm grade 1 anterolisthesis of C7
on T1. The facet joints are appropriately aligned.

Skull base and vertebrae: The atlantodens interval is intact.
Vertebral body heights are maintained. Moderate C3-4, moderate to
severe left-greater-than-right C4-5, moderate C5-6, severe C6-7, and
mild C7-T1 disc space narrowing. No acute fracture is seen.

Soft tissues and spinal canal: No central canal hyperdensity is seen
to indicate CT evidence of an acute epidural hematoma.

Disc levels: Multilevel degenerative changes including disc space
narrowing, uncovertebral hypertrophy, and facet hypertrophy
contribute to moderate to severe right C2-3, severe right and
moderate left C3-4, moderate left C4-5, moderate left greater than
right C5-6, and mild-to-moderate right and mild left C6-7 neural
foraminal stenosis. Mild left C5-6 lateral recess narrowing. Mild
C4-5 and minimal C3-4 central canal stenosis.

Upper chest: There is ground-glass and curvilinear scarring within
the left-greater-than-right lung apices not significantly changed
compared to CTA chest [DATE].

Other: Partial visualization of left internal jugular catheter
related to the patient's left chest wall port a catheter is seen on
today's chest radiograph. No cervical chain lymphadenopathy is seen.
IMPRESSION: :
IMPRESSION: 1. Multilevel spondylolisthesis at C2-3 through C5-6 and C7 on T1.
2. Multilevel degenerative disc and joint changes as above with
multilevel neural foraminal stenosis.
3. No acute fracture.

## 2021-11-25 IMAGING — CT CT CHEST W/O CM
2 of 4 series · 15 of 36 positions shown, 18 images · non-contrast
Comparison: Radiograph of same day.  CT scan [DATE].

CLINICAL DATA: Pneumonia.



[Series 2: thorax · axial · 0.67mm/px · z∈[-70,+198]mm · 12 of 160 slices shown, 15 images]
[im 13/160  mediastinal]
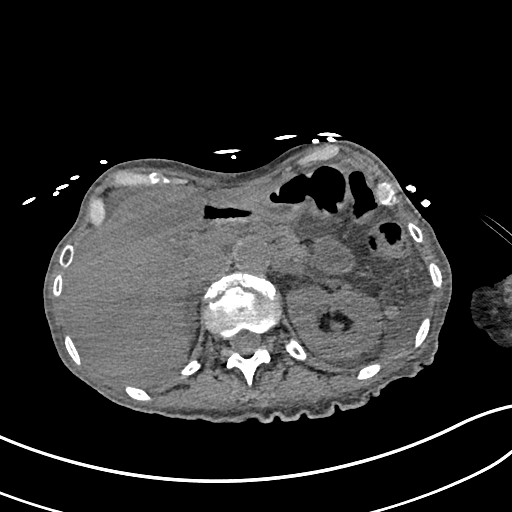
[im 13/160  lung]
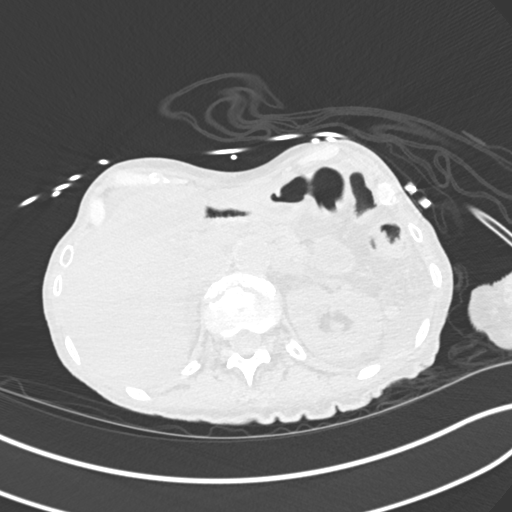
[im 25/160  lung]
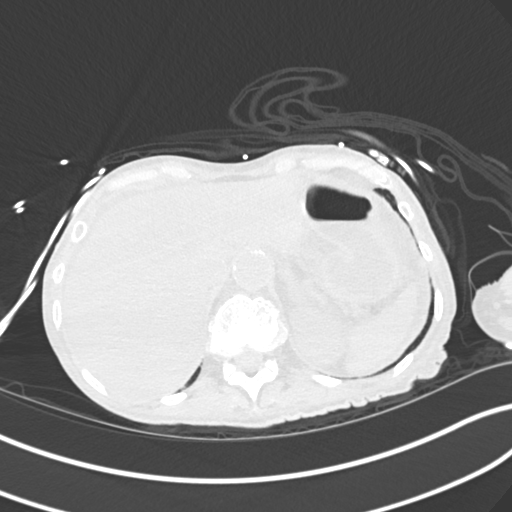
[im 37/160  lung]
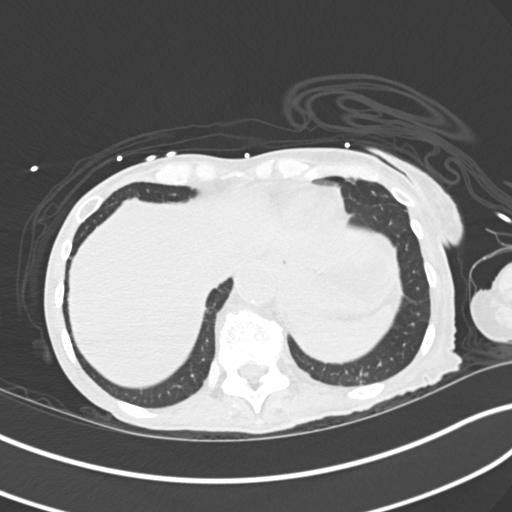
[im 49/160  lung]
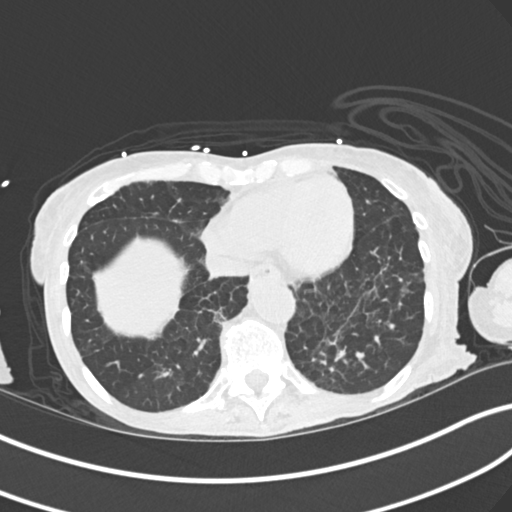
[im 62/160  mediastinal]
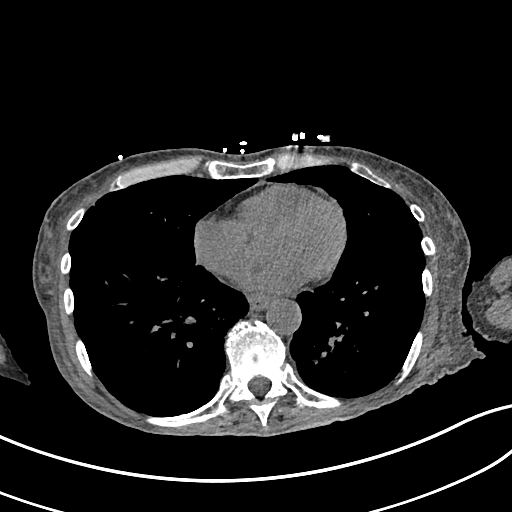
[im 62/160  lung]
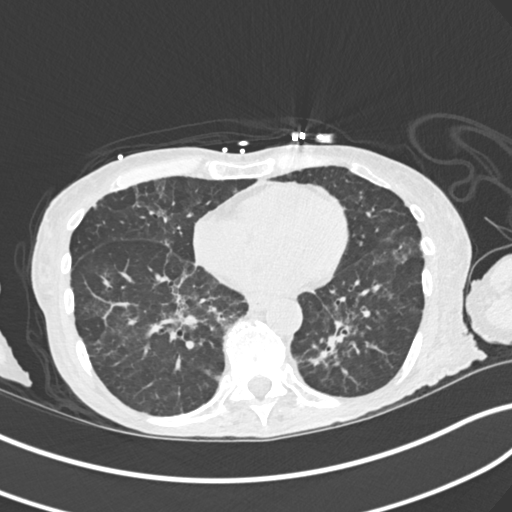
[im 74/160  lung]
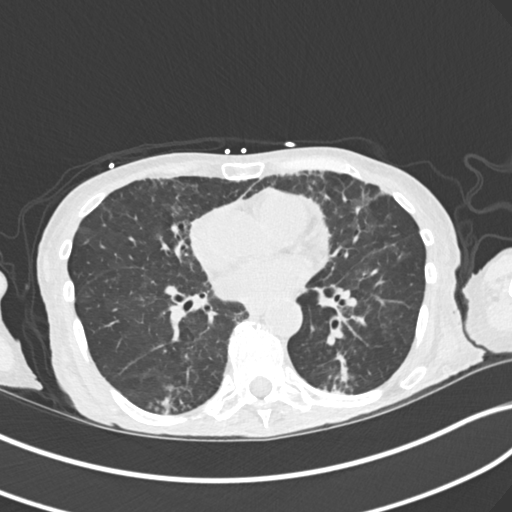
[im 86/160  lung]
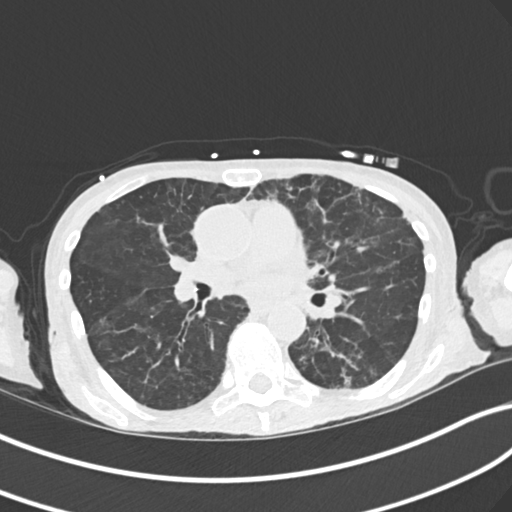
[im 98/160  lung]
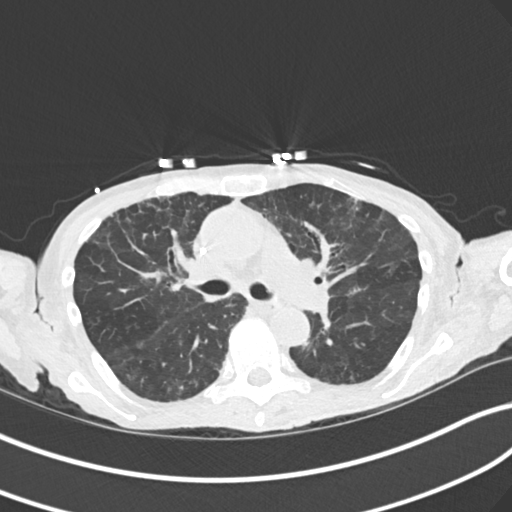
[im 111/160  mediastinal]
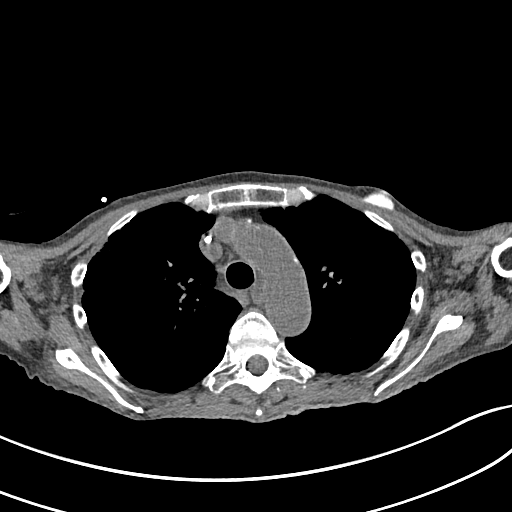
[im 111/160  lung]
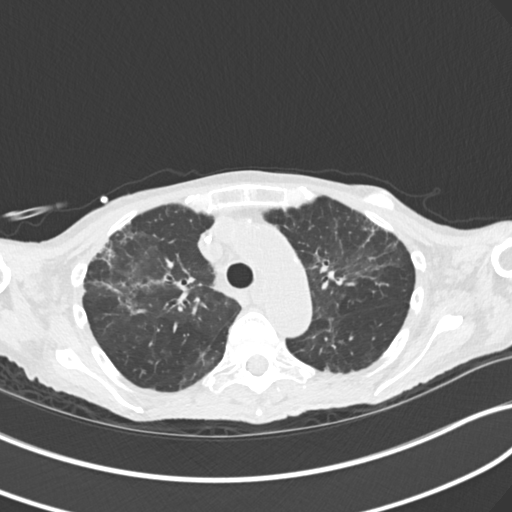
[im 123/160  lung]
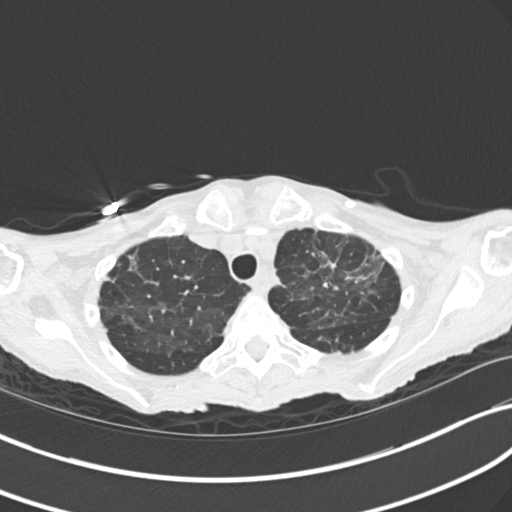
[im 135/160  lung]
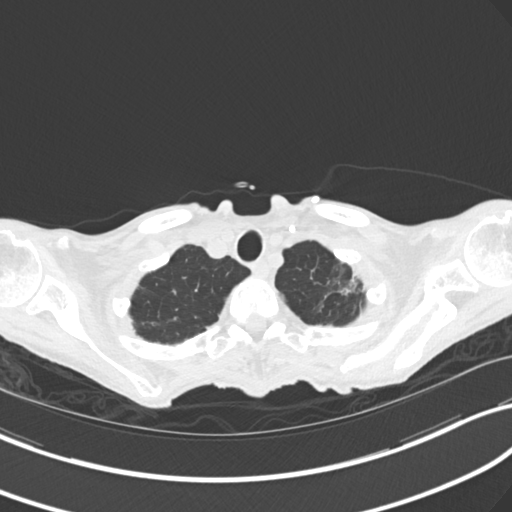
[im 147/160  lung]
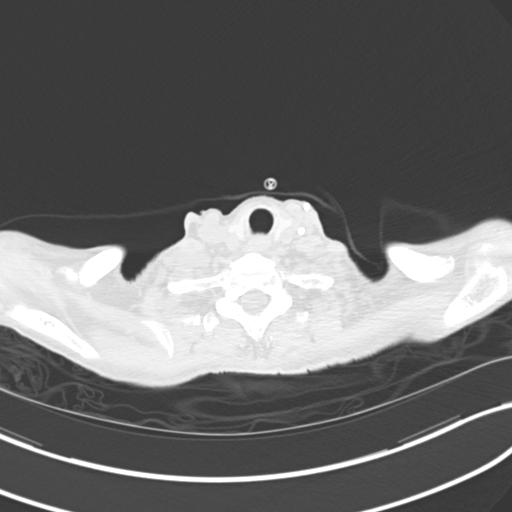

[Series 5: coronal · coronal · 0.65mm/px · 3 of 100 slices shown]
[im 20/100  lung]
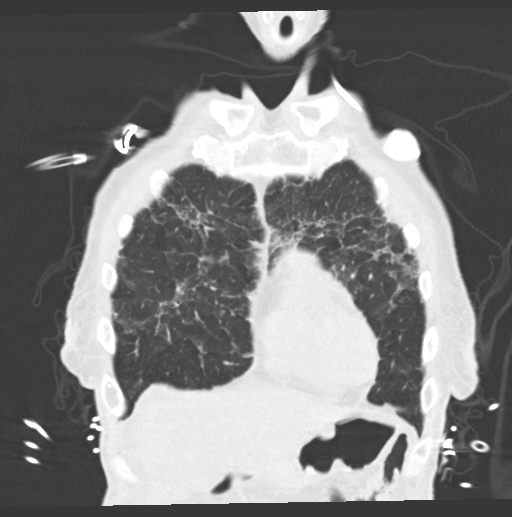
[im 40/100  lung]
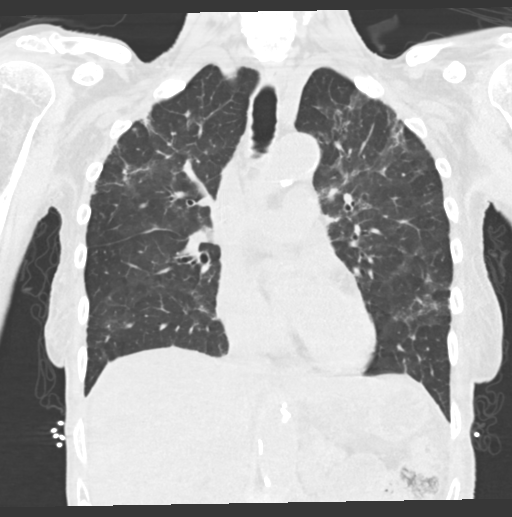
[im 60/100  lung]
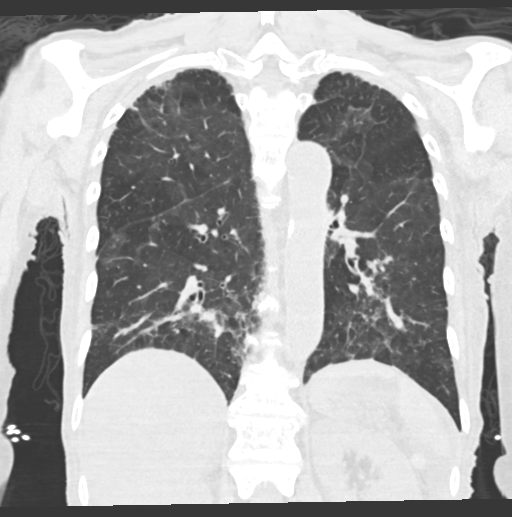

[15 of 36 positions shown; findings below may reference images not displayed]

FINDINGS: Cardiovascular: Atherosclerosis of thoracic aorta is noted without
aneurysm formation. Normal cardiac size. No pericardial effusion.

Mediastinum/Nodes: No enlarged mediastinal or axillary lymph nodes.
Thyroid gland, trachea, and esophagus demonstrate no significant
findings.

Lungs/Pleura: No pneumothorax or pleural effusion is noted. Stable
reticular densities are noted in the right upper lobe most
consistent with scarring or chronic interstitial lung disease.
Stable probable scarring is noted in right middle lobe and lingular
segment of left upper lobe. However, increased left posterior
basilar opacity is noted concerning for atelectasis or possibly
pneumonia. Multiple nodular opacities are noted posteriorly in the
right lower lobe concerning for atypical or mycobacterial infection.

Upper Abdomen: Hepatic steatosis.

Musculoskeletal: No chest wall mass or suspicious bone lesions
identified.
IMPRESSION: Increased left posterior basilar opacity is noted concerning for
atelectasis or possibly pneumonia. There is also the interval
development of multiple irregular nodular opacities posteriorly in
the right lung base concerning for atypical or mycobacterial
infection.

Stable bilateral upper lobe opacities are noted most consistent with
scarring or chronic interstitial lung disease.

Hepatic steatosis.

Aortic Atherosclerosis ([78]-[78]).

## 2021-11-25 IMAGING — DX DG CHEST 1V PORT
1 series · 1 of 1 positions shown · non-contrast
Comparison: Radiographs [DATE] and [DATE].  CT [DATE].

CLINICAL DATA: Altered mental status after falling yesterday and
striking head.

EXAM:
PORTABLE CHEST 1 VIEW

[chest ap]
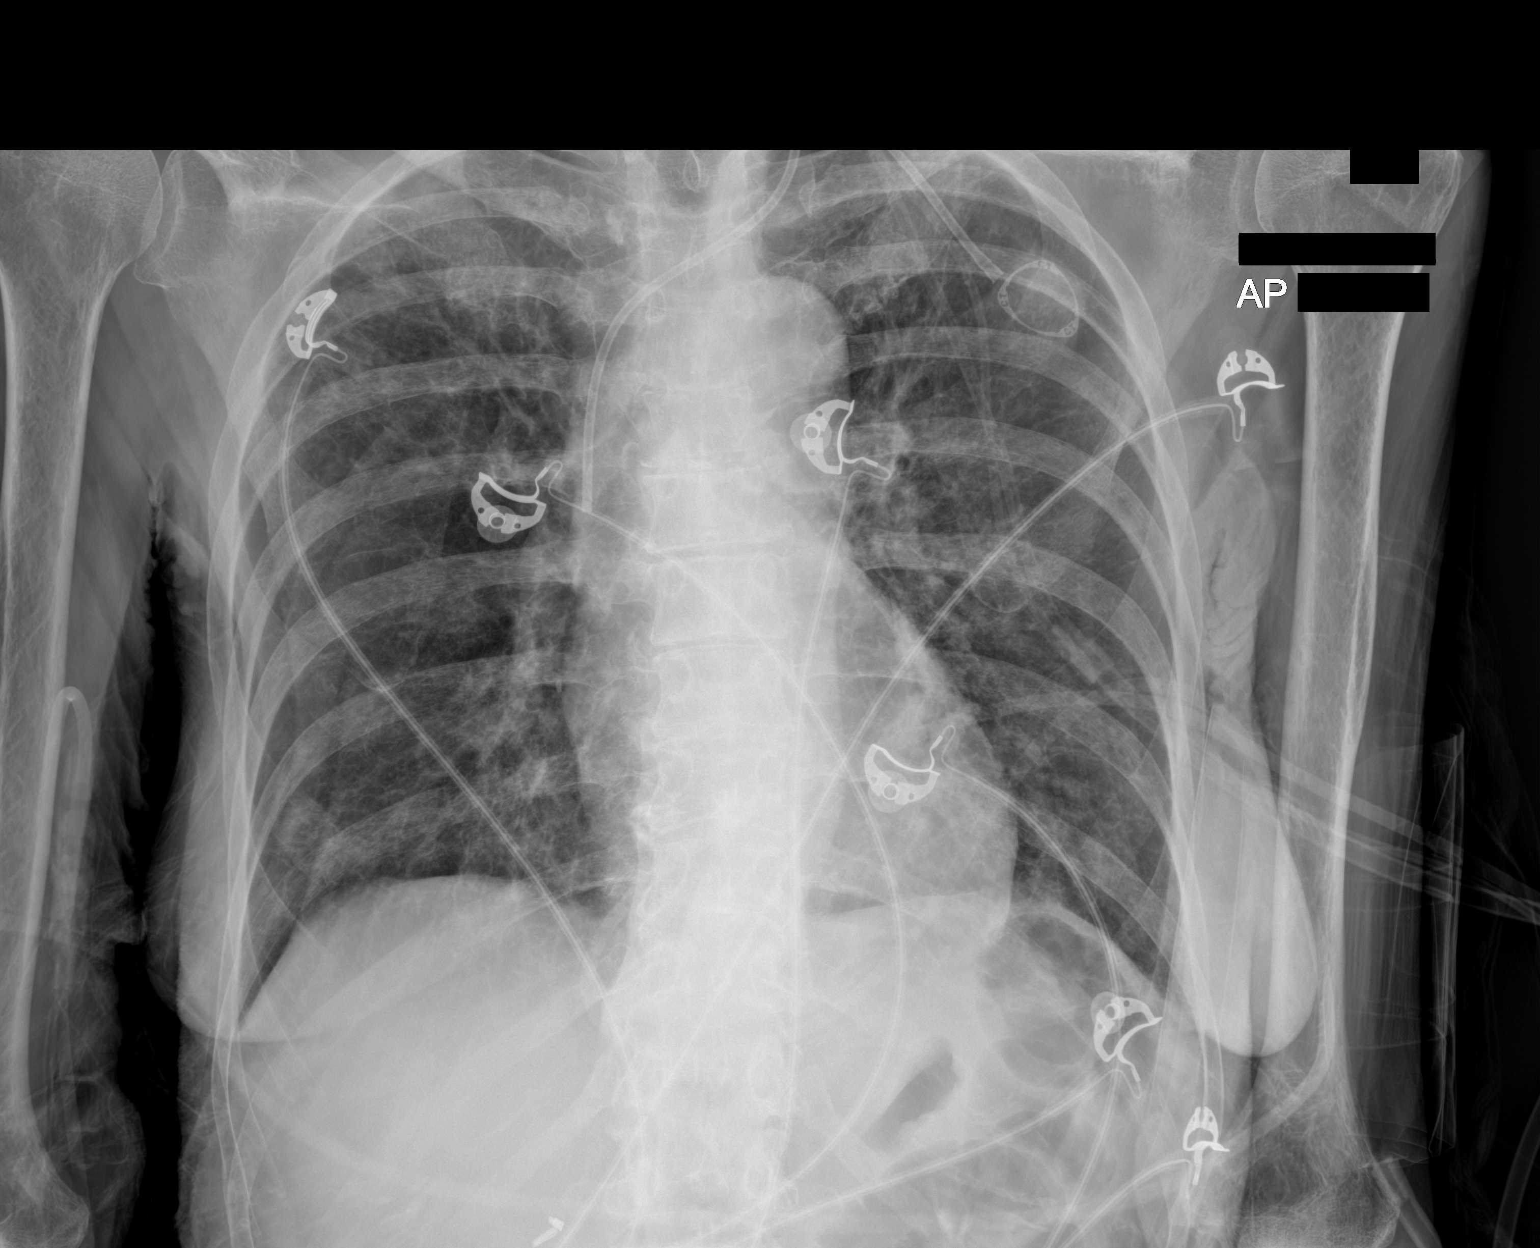

[1 of 1 positions shown; findings below may reference images not displayed]

FINDINGS: [15] hours. Left IJ Port-A-Cath tip is unchanged at the level of the
mid SVC. The heart size and mediastinal contours are stable. Stable
chronic lung disease with emphysema and scattered ground-glass
opacities. No superimposed edema, confluent airspace opacity,
pleural effusion or pneumothorax. The bones appear unchanged.
IMPRESSION: Stable radiographic appearance of the chest with chronic
interstitial lung disease. No evidence of acute cardiopulmonary
process.

## 2021-11-25 MED ORDER — ONDANSETRON HCL 4 MG/2ML IJ SOLN: 4.0000 mg | Freq: Four times a day (QID) | INTRAMUSCULAR | Status: DC | PRN

## 2021-11-25 MED ORDER — ACETAMINOPHEN 650 MG RE SUPP: 650.0000 mg | Freq: Four times a day (QID) | RECTAL | Status: DC | PRN

## 2021-11-25 MED ORDER — TRAZODONE HCL 50 MG PO TABS
50.0000 mg | ORAL_TABLET | Freq: Every evening | ORAL | Status: DC | PRN
  Administered 2021-11-26: 21:00:00 100 mg via ORAL
  Filled 2021-11-25: qty 2

## 2021-11-25 MED ORDER — ALBUTEROL SULFATE (2.5 MG/3ML) 0.083% IN NEBU: 2.5000 mg | INHALATION_SOLUTION | Freq: Four times a day (QID) | RESPIRATORY_TRACT | Status: DC | PRN

## 2021-11-25 MED ORDER — GABAPENTIN 300 MG PO CAPS
600.0000 mg | ORAL_CAPSULE | Freq: Three times a day (TID) | ORAL | Status: DC
Start: 2021-11-25 — End: 2021-11-28
  Administered 2021-11-26 – 2021-11-28 (×7): 600 mg via ORAL
  Filled 2021-11-25 (×7): qty 2

## 2021-11-25 MED ORDER — ACETAMINOPHEN 325 MG PO TABS: 650.0000 mg | ORAL_TABLET | Freq: Four times a day (QID) | ORAL | Status: DC | PRN

## 2021-11-25 MED ORDER — SODIUM CHLORIDE 0.9 % IV SOLN
2.0000 g | INTRAVENOUS | Status: DC
  Administered 2021-11-26: 14:00:00 2 g via INTRAVENOUS
  Filled 2021-11-25: qty 20
  Filled 2021-11-25: qty 2

## 2021-11-25 MED ORDER — ONDANSETRON HCL 4 MG PO TABS: 4.0000 mg | ORAL_TABLET | Freq: Four times a day (QID) | ORAL | Status: DC | PRN

## 2021-11-25 MED ORDER — SODIUM CHLORIDE 0.9 % IV BOLUS
1000.0000 mL | Freq: Once | INTRAVENOUS | Status: AC
Start: 2021-11-25 — End: 2021-11-25
  Administered 2021-11-25: 12:00:00 1000 mL via INTRAVENOUS

## 2021-11-25 MED ORDER — METOPROLOL TARTRATE 5 MG/5ML IV SOLN: 5.0000 mg | Freq: Once | INTRAVENOUS | Status: DC

## 2021-11-25 MED ORDER — ALBUTEROL SULFATE HFA 108 (90 BASE) MCG/ACT IN AERS: 2.0000 | INHALATION_SPRAY | Freq: Four times a day (QID) | RESPIRATORY_TRACT | Status: DC | PRN

## 2021-11-25 MED ORDER — MELATONIN 5 MG PO TABS
10.0000 mg | ORAL_TABLET | Freq: Every evening | ORAL | Status: DC | PRN
  Filled 2021-11-25: qty 2

## 2021-11-25 MED ORDER — POTASSIUM CHLORIDE 2 MEQ/ML IV SOLN
INTRAVENOUS | Status: DC
  Filled 2021-11-25 (×2): qty 1000

## 2021-11-25 MED ORDER — OXCARBAZEPINE 300 MG PO TABS
600.0000 mg | ORAL_TABLET | Freq: Two times a day (BID) | ORAL | Status: DC
  Administered 2021-11-26 – 2021-11-28 (×5): 600 mg via ORAL
  Filled 2021-11-25 (×6): qty 2

## 2021-11-25 MED ORDER — METOPROLOL TARTRATE 25 MG PO TABS
12.5000 mg | ORAL_TABLET | Freq: Two times a day (BID) | ORAL | Status: DC
  Administered 2021-11-25 – 2021-11-26 (×2): 12.5 mg via ORAL
  Filled 2021-11-25 (×2): qty 1

## 2021-11-25 MED ORDER — APIXABAN 5 MG PO TABS
5.0000 mg | ORAL_TABLET | Freq: Two times a day (BID) | ORAL | Status: DC
  Administered 2021-11-26 – 2021-11-28 (×5): 5 mg via ORAL
  Filled 2021-11-25 (×5): qty 1

## 2021-11-25 MED ORDER — MOMETASONE FURO-FORMOTEROL FUM 100-5 MCG/ACT IN AERO
2.0000 | INHALATION_SPRAY | Freq: Two times a day (BID) | RESPIRATORY_TRACT | Status: DC
  Administered 2021-11-27 – 2021-11-28 (×3): 2 via RESPIRATORY_TRACT
  Filled 2021-11-25: qty 8.8

## 2021-11-25 MED ORDER — POLYETHYLENE GLYCOL 3350 17 G PO PACK: 17.0000 g | PACK | Freq: Every day | ORAL | Status: DC | PRN

## 2021-11-25 MED ORDER — OXYCODONE HCL 5 MG PO TABS
30.0000 mg | ORAL_TABLET | ORAL | Status: DC | PRN
  Administered 2021-11-26 – 2021-11-27 (×2): 30 mg via ORAL
  Filled 2021-11-25 (×2): qty 6

## 2021-11-25 MED ORDER — MEGESTROL ACETATE 400 MG/10ML PO SUSP
625.0000 mg | Freq: Every day | ORAL | Status: DC
Start: 2021-11-25 — End: 2021-11-28
  Administered 2021-11-26: 14:00:00 625 mg via ORAL
  Filled 2021-11-25 (×4): qty 20

## 2021-11-25 MED ORDER — ADULT MULTIVITAMIN W/MINERALS CH
ORAL_TABLET | Freq: Every day | ORAL | Status: DC
  Filled 2021-11-25 (×4): qty 1

## 2021-11-25 MED ORDER — NICOTINE 7 MG/24HR TD PT24
7.0000 mg | MEDICATED_PATCH | Freq: Every day | TRANSDERMAL | Status: DC
  Administered 2021-11-26 – 2021-11-28 (×3): 7 mg via TRANSDERMAL
  Filled 2021-11-25 (×5): qty 1

## 2021-11-25 MED ORDER — FENTANYL 50 MCG/HR TD PT72
2.0000 | MEDICATED_PATCH | TRANSDERMAL | Status: DC
  Administered 2021-11-28: 2 via TRANSDERMAL
  Filled 2021-11-25 (×2): qty 1

## 2021-11-25 MED ORDER — ENSURE ENLIVE PO LIQD
237.0000 mL | Freq: Three times a day (TID) | ORAL | Status: DC
  Administered 2021-11-26 – 2021-11-27 (×4): 237 mL via ORAL

## 2021-11-25 MED ORDER — CEFTRIAXONE SODIUM 1 G IJ SOLR
1.0000 g | Freq: Once | INTRAMUSCULAR | Status: AC
  Administered 2021-11-25: 15:00:00 1 g via INTRAVENOUS
  Filled 2021-11-25: qty 10

## 2021-11-25 NOTE — ED Notes (Signed)
Pt family member on phone with pt and was updated on plan of care and test that are awaiting results.   Pt given TV remote and call light is within reach. No other needs at this time

## 2021-11-25 NOTE — ED Provider Triage Note (Signed)
Emergency Medicine Provider Triage Evaluation Note  Shannon Schroeder, a 66 y.o. female  was evaluated in triage.  Pt complains of mechanical fall. Patient with dysarthria presents via EMS from home.  Per report, the patient had a mechanical fall yesterday, and is on blood thinners.  She presents today with report of altered mental status.  Review of Systems  Positive: AMS, head injury Negative: FCS  Physical Exam  LMP 11/19/1988 (Exact Date)  Gen:   Awake, no distress   Resp:  Normal effort  MSK:   Moves extremities without difficulty  Other:  CVS: tachy rate  Medical Decision Making  Medically screening exam initiated at 12:19 PM.  Appropriate orders placed.  Shannon Schroeder was informed that the remainder of the evaluation will be completed by another provider, this initial triage assessment does not replace that evaluation, and the importance of remaining in the ED until their evaluation is complete.  Geriatric patient ED evaluation of mechanical fall on blood thinners a minor head injury, presented today with AMS.  She was found to be hypotensive and tachycardic upon triage.  Appropriate labs and cultures are ordered.  Fluid bolus is provided.   Shannon Needles, PA-C 11/25/21 1227

## 2021-11-25 NOTE — ED Triage Notes (Signed)
Pt here via [facility] with a fall yesterday and hit her head. Pt having AMS today. Pt in NAD in triage.

## 2021-11-25 NOTE — ED Provider Notes (Signed)
[facility] Provider Note    Event Date/Time   First MD Initiated Contact with Patient 11/25/21 1253     (approximate)   History   Altered Mental Status   HPI  Shannon Schroeder is a 66 y.o. female with a history of oropharyngeal cancer presents to the ER for evaluation of a fall yesterday generalized weakness.  Reportedly was febrile to 102.9 by EMS.  Having poor p.o. intake but denies any pain.  Has had some nonproductive cough.  No measured rashes.  No diarrhea.  Denies any headaches.  She is on blood thinners.  No recent antibiotics.     Physical Exam   Triage Vital Signs: ED Triage Vitals  Enc Vitals Group     BP 11/25/21 1219 (!) 91/59     Pulse Rate 11/25/21 1219 (!) 122     Resp 11/25/21 1219 18     Temp 11/25/21 1219 98 F (36.7 C)     Temp Source 11/25/21 1219 Oral     SpO2 11/25/21 1219 92 %     Weight 11/25/21 1220 105 lb 9.6 oz (47.9 kg)     Height 11/25/21 1220 5\' 6"  (1.676 m)     Head Circumference --      Peak Flow --      Pain Score 11/25/21 1220 6     Pain Loc --      Pain Edu? --      Excl. in [place]? --     Most recent vital signs: Vitals:   11/25/21 1500 11/25/21 1530  BP: 114/72 123/62  Pulse: 91 (!) 37  Resp: 16 17  Temp:    SpO2: 100% 100%     Constitutional: Alert, frail and ill appearing Eyes: Conjunctivae are normal.  Head: Atraumatic. Nose: No congestion/rhinnorhea. Mouth/Throat: Mucous membranes are moist.   Neck: Painless ROM.  Cardiovascular:   Good peripheral circulation. Respiratory: Normal respiratory effort.  No retractions.  Gastrointestinal: Soft and nontender.  Musculoskeletal:  no deformity Neurologic:  MAE spontaneously. No gross focal neurologic deficits are appreciated.  Skin:  Skin is warm, dry and intact. No rash noted. Psychiatric: Mood and affect are normal. Speech and behavior are normal.    ED Results / Procedures / Treatments   Labs (all labs ordered are listed, but only  abnormal results are displayed) Labs Reviewed  COMPREHENSIVE METABOLIC PANEL - Abnormal; Notable for the following components:      Result Value   Sodium 134 (*)    Potassium 3.3 (*)    Chloride 94 (*)    Glucose, Bld 123 (*)    Creatinine, Ser 0.39 (*)    Calcium 8.5 (*)    Total Protein 6.2 (*)    Albumin 2.2 (*)    Alkaline Phosphatase 173 (*)    All other components within normal limits  CBC - Abnormal; Notable for the following components:   WBC 18.8 (*)    RBC 3.13 (*)    Hemoglobin 9.9 (*)    HCT 30.2 (*)    RDW 18.4 (*)    Platelets 680 (*)    All other components within normal limits  PROTIME-INR - Abnormal; Notable for the following components:   Prothrombin Time 24.2 (*)    INR 2.2 (*)    All other components within normal limits  LACTIC ACID, PLASMA - Abnormal; Notable for the following components:   Lactic Acid, Venous 2.8 (*)    All other components within normal limits  RESP PANEL BY RT-PCR (FLU A&B, COVID) ARPGX2  CULTURE, BLOOD (ROUTINE X 2)  CULTURE, BLOOD (ROUTINE X 2)  URINALYSIS, COMPLETE (UACMP) WITH MICROSCOPIC  CBG MONITORING, ED  TYPE AND SCREEN     EKG  ED ECG REPORT I, Merlyn Lot, the attending physician, personally viewed and interpreted this ECG.   Date: 11/25/2021  EKG Time: 12:32  Rate: 150  Rhythm: afib with rvr  Axis: normal  Intervals:normal intervals  ST&T Change: nonspecific st abn, no stemi    RADIOLOGY Please see ED Course for my review and interpretation.  I personally reviewed all radiographic images ordered to evaluate for the above acute complaints and reviewed radiology reports and findings.  These findings were personally discussed with the patient.  Please see medical record for radiology report.    PROCEDURES:  Critical Care performed: No  Procedures   MEDICATIONS ORDERED IN ED: Medications  sodium chloride 0.9 % bolus 1,000 mL (0 mLs Intravenous Stopped 11/25/21 1400)  cefTRIAXone (ROCEPHIN) 1 g  in sodium chloride 0.9 % 100 mL IVPB (1 g Intravenous New Bag/Given 11/25/21 1454)     IMPRESSION / MDM / ASSESSMENT AND PLAN / ED COURSE  I reviewed the triage vital signs and the nursing notes.                              Differential diagnosis includes, but is not limited to, Dehydration, sepsis, pna, uti, hypoglycemia, cva, drug effect, withdrawal, encephalitis  Patient with extensive past medical history presented the ER for weakness and falls.  Portably febrile with EMS to 102.  She is afebrile on arrival in the ER.  Does wear chronic home oxygen.  She denies any pain or discomfort.  Admits to poor p.o. intake.  Does appear very frail.  Bladder percent for the blood differential.  Given her frailty anticipate patient will require hospitalization.  She arrives also in A. fib with RVR which she has in the past and is anticoagulated.  Patient will be given IV fluids and if this persist we will give IV Cardizem.  Clinical Course as of 11/25/21 1542  Tue Nov 25, 2021  1339 CT head on my review does not show evidence of subdural hematoma we will await formal radiology report.  Chest x-ray without evidence of pneumothorax. [PR]  3428 Cardiac monitor patient is no longer in RVR after IV fluids.  Heart rate in the 90s. [PR]  1529 Patient's lactate is 2.8.  We will continue with IV hydration.  Patient will require hospitalization. [PR]  1541 Case discussed in consultation with hospitalist who is currently accepted to their service. [PR]    Clinical Course User Index [PR] Merlyn Lot, MD     FINAL CLINICAL IMPRESSION(S) / ED DIAGNOSES   Final diagnoses:  Sepsis without acute organ dysfunction, due to unspecified organism [facility])  Atrial fibrillation with RVR ([place])     Rx / DC Orders   ED Discharge Orders     None        Note:  This document was prepared using Dragon voice recognition software and may include unintentional dictation errors.    Merlyn Lot,  MD 11/25/21 [phone]

## 2021-11-25 NOTE — H&P (Signed)
History and Physical    Shannon Schroeder PNT:[id number] DOB: 03-Feb-1956 DOA: 11/25/2021  PCP: Pcp, No   Patient coming from: Home  I have personally briefly reviewed patient's old medical records in [place]  Chief Complaint: Weakness/fall  HPI: Shannon Schroeder is a 66 y.o. 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 Patient states that she was trying to get out of bed when she fell yesterday and was unable to get up.  She was able to get up with assistance of her friend and roommate.  She denies feeling dizzy or lightheaded prior to the fall and denied having any loss of consciousness. She complains of difficulty swallowing despite being on a pured diet and states that she chokes on most of her meals.  She has an occasional cough.  She denies having any abdominal pain, no nausea, no vomiting or any changes in her bowel habits.  She denies having any urinary symptoms.  She has had no sick contacts.  She had a T-max of 102.6 upon arrival to the ER was tachycardic with heart rate of 90. She denies having any chest pain, no shortness of breath, no headache, no dizziness, no lightheadedness, no loss of consciousness, no blurred vision or any focal deficit. Sodium 134, potassium 3.3, chloride 94, bicarb 29, glucose 123, BUN 15, creatinine 0.39, calcium 8.5, alkaline phosphatase 173, albumin 2.2, AST 20, ALT 9, total protein 6.2, troponin 13, lactic acid 2.8, white count 18.8, hemoglobin 9.9, hematocrit 30.2, MCV 96.5, RDW 18.4, platelet count 680, PT 24.2, INR 2.2 Respiratory viral panel is negative Urine analysis shows pyuria CT scan of the head without contrast and cervical spine CT Postsurgical changes of right frontal  craniotomy for prior distal right M1 aneurysm.  No acute intracranial process. Multiple level spondylolisthesis at C2-3 through C5-C6 and C7 on T1.  No acute fracture CT maxillofacial without contrast shows tumor progression with destruction of the skull base on the right in the region of the foreman ovale and carotid canal with tumor extension into the right division of the sphenoid sinus and medial aspect of the middle cranial fossa.  These abnormalities could be better evaluated with MRI. Chest x-ray reviewed by me shows chronic interstitial lung disease. Twelve-lead EKG reviewed by me shows A. fib with a rapid ventricular rate.  ST changes in the lateral leads    ED Course: Patient is a 66 year old female with a history of head and neck cancer who presents to the ER for evaluation following a fall at home and was found to have a fever with a T-max of 102F, she was tachycardic, has elevated lactic acid level, leukocytosis and pyuria. Upon arrival to the ER she was noted to be in rapid A. fib and is improved following IV fluid hydration.  She received 1 L of IV fluid bolus in the ER She received a dose of IV Rocephin and will be admitted to the hospital for further evaluation.   Review of Systems: As per HPI otherwise all other systems reviewed and negative.    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    Anxiety    Arthritis    Bipolar disorder ([place])  COPD (chronic obstructive pulmonary disease) (HCC)    NO INHALERS   Depression    Dysrhythmia    H/O V TACH   Head and neck cancer ([place]) 07/29/2020   Headache    H/O MIGRAINES   Hypertension    Seizures ([place])    X1 AFTER FALL    Past Surgical History:  Procedure Laterality Date   ABDOMINAL HYSTERECTOMY     partial   BREAST SURGERY     biopsy   CARDIAC CATHETERIZATION     X 2   CARPAL TUNNEL RELEASE Right    CARPECTOMY HAND Right     CATARACT EXTRACTION W/PHACO Left 03/08/2018   Procedure: CATARACT EXTRACTION PHACO AND INTRAOCULAR LENS PLACEMENT ([place]);  Surgeon: Birder Robson, MD;  Location: [facility] ORS;  Service: Ophthalmology;  Laterality: Left;  [place] 00:55 AP% 13.5 CDE 7.52 Fluid pack lot # [id number]   CATARACT EXTRACTION W/PHACO Right 04/05/2018   Procedure: CATARACT EXTRACTION PHACO AND INTRAOCULAR LENS PLACEMENT ([facility]);  Surgeon: Birder Robson, MD;  Location: [facility] ORS;  Service: Ophthalmology;  Laterality: Right;  [place] 00:36 AP% 15.9 CDE 5.72 Fluid pack lot # [id number]   CEREBRAL ANEURYSM REPAIR     CHOLECYSTECTOMY     COLONOSCOPY WITH PROPOFOL N/A 04/26/2019   Procedure: COLONOSCOPY WITH PROPOFOL;  Surgeon: Jonathon Bellows, MD;  Location[facility];  Service: Gastroenterology;  Laterality: N/A;   PORTACATH PLACEMENT Left 08/05/2020   Procedure: INSERTION PORT-A-CATH;  Surgeon: Herbert Pun, MD;  Location: [facility] ORS;  Service: General;  Laterality: Left;   TUBAL LIGATION       reports that she has been smoking cigarettes. She has a 12.50 pack-year smoking history. She has never used smokeless tobacco. She reports that she does not drink alcohol and does not use drugs.  Allergies  Allergen Reactions   Naproxen Swelling, Rash and Other (See Comments)   Aspirin Swelling and Other (See Comments)   Belladonna Alkaloids Rash   Fluoxetine Hives and Rash   Fluoxetine Hcl Other (See Comments) and Rash   Paroxetine Hcl Rash and Other (See Comments)   Phenobarbital Rash and Other (See Comments)   Prozac [Fluoxetine Hcl] Rash    Family History  Problem Relation Age of Onset   Hypertension Mother    Heart failure Mother    Hypertension Brother    Stroke Brother    Hypertension Son    Emphysema Maternal Aunt    Hypertension Paternal Aunt    Hypertension Paternal Uncle    Hypertension Maternal Grandmother    Hypertension Paternal Grandmother       Prior to Admission medications   Medication Sig Start Date  End Date Taking? Authorizing Provider  ADVAIR HFA [phone] MCG/ACT inhaler INHALE 2 PUFFS INTO THE LUNGS TWICE DAILY 09/11/21   Earlie Server, MD  albuterol (VENTOLIN HFA) 108 (90 Base) MCG/ACT inhaler Inhale 2 puffs into the lungs every 6 (six) hours as needed for wheezing or shortness of breath. 05/14/21   Earlie Server, MD  amiodarone (PACERONE) 200 MG tablet Take 1 tablet (200 mg total) by mouth daily. Hold this medication until you see your cardiologist and/or pulmonologist Patient not taking: Reported on 10/07/2021 08/08/21   Wyvonnia Dusky, MD  apixaban (ELIQUIS) 5 MG TABS tablet TAKE 1 TABLET BY MOUTH TWICE DAILY 10/20/21   Minna Merritts, MD  chlorhexidine (PERIDEX) 0.12 % solution Use as directed 15 mLs in the mouth or throat 2 (two) times daily. 05/21/21   Earlie Server, MD  docusate sodium (COLACE)  100 MG capsule Take 1 capsule (100 mg total) by mouth daily. 08/26/20   Earlie Server, MD  feeding supplement, ENSURE ENLIVE, (ENSURE ENLIVE) LIQD Take 237 mLs by mouth 3 (three) times daily between meals. 08/10/20   Fritzi Mandes, MD  fentaNYL (DURAGESIC) 100 MCG/HR Place 1 patch onto the skin every 3 (three) days. 11/05/21   Borders, Kirt Boys, NP  furosemide (LASIX) 20 MG tablet Take 1 tablet (20 mg total) by mouth daily as needed (for lower extremity swelling). Patient not taking: Reported on 04/14/2021 09/20/20 12/19/20  Rise Mu, PA-C  gabapentin (NEURONTIN) 300 MG capsule Take 2 capsules (600 mg total) by mouth 3 (three) times daily. 10/20/21   Borders, Kirt Boys, NP  lidocaine-prilocaine (EMLA) cream Apply 1 application topically as needed [facility]). Apply to affected area once 08/11/21   Borders, Kirt Boys, NP  magnesium chloride (SLOW-MAG) 64 MG TBEC SR tablet Take 1 tablet (64 mg total) by mouth 2 (two) times daily. 10/07/20   Earlie Server, MD  megestrol (MEGACE ES) 625 MG/5ML suspension Take 5 mLs (625 mg total) by mouth daily. 10/07/21   Earlie Server, MD  Melatonin 10 MG TABS Take 10 mg by mouth at bedtime as needed  (sleep).    [provider]  metoprolol tartrate (LOPRESSOR) 25 MG tablet TAKE 1/2 TABLET BY MOUTH TWICE DAILY 10/20/21   Minna Merritts, MD  Multiple Vitamins-Minerals (CENTRUM SILVER PO) Take 1 tablet by mouth daily. Gummie    [provider]  ondansetron (ZOFRAN-ODT) 8 MG disintegrating tablet Take 1 tablet (8 mg total) by mouth every 8 (eight) hours as needed for nausea or vomiting. 08/11/21   Borders, Kirt Boys, NP  oxcarbazepine (TRILEPTAL) 600 MG tablet Take 1 tablet (600 mg total) by mouth 2 (two) times daily. 11/19/21   Borders, Kirt Boys, NP  oxycodone (ROXICODONE) 30 MG immediate release tablet Take 1 tablet (30 mg total) by mouth every 4 (four) hours as needed for pain. 11/18/21   Borders, Kirt Boys, NP  polyethylene glycol powder (GLYCOLAX/MIRALAX) 17 GM/SCOOP powder Take 17 g by mouth daily as needed for mild constipation or moderate constipation. 09/11/20   Mercy Riding, MD  potassium chloride SA (KLOR-CON) 20 MEQ tablet Take 2 tablets (40 mEq total) by mouth daily. 05/21/21   Earlie Server, MD  predniSONE (DELTASONE) 50 MG tablet 50 mg daily x 7 days, 40 mg daily x 7 days, 30 mg daily x 7 days, 20 mg daily x 7 days, & 10 mg daily x 7 days then stop Patient not taking: Reported on 10/07/2021 08/09/21   Wyvonnia Dusky, MD  prochlorperazine (COMPAZINE) 10 MG tablet Take 1 tablet (10 mg total) by mouth every 6 (six) hours as needed (Nausea or vomiting). 08/11/21   Borders, Kirt Boys, NP  traZODone (DESYREL) 50 MG tablet Take 1-2 tablets (50-100 mg total) by mouth at bedtime as needed for sleep. 07/15/21   BordersKirt Boys, NP    Physical Exam: Vitals:   11/25/21 1220 11/25/21 1254 11/25/21 1500 11/25/21 1530  BP:  (!) 129/93 114/72 123/62  Pulse:  (!) 121 91 (!) 37  Resp:  14 16 17   Temp:      TempSrc:      SpO2:  100% 100% 100%  Weight: 47.9 kg     Height: 5\' 6"  (1.676 m)        Vitals:   11/25/21 1220 11/25/21 1254 11/25/21 1500 11/25/21 1530  BP:  (!) 129/93  114/72  123/62  Pulse:  (!) 121 91 (!) 37  Resp:  14 16 17   Temp:      TempSrc:      SpO2:  100% 100% 100%  Weight: 47.9 kg     Height: 5\' 6"  (1.676 m)         Constitutional: Alert and oriented x 3 . Not in any apparent distress.  Chronically ill-appearing HEENT:      Head: Normocephalic and atraumatic.         Eyes: PERLA, EOMI, Conjunctivae pallor. Sclera is non-icteric.       Mouth/Throat: Mucous membranes are moist.  Drooling      Neck: Supple with no signs of meningismus. Cardiovascular: Irregularly irregular. No murmurs, gallops, or rubs. 2+ symmetrical distal pulses are present . No JVD. No LE edema Respiratory: Respiratory effort normal .Lungs sounds clear bilaterally. No wheezes, crackles, or rhonchi.  Gastrointestinal: Soft, non tender, and non distended with positive bowel sounds.  Genitourinary: No CVA tenderness. Musculoskeletal: Nontender with normal range of motion in all extremities. No cyanosis, or erythema of extremities. Neurologic:  Face is symmetric. Moving all extremities. No gross focal neurologic deficits .  Generalized weakness Skin: Skin is warm, dry.  No rash or ulcers Psychiatric: Mood and affect are normal    Labs on Admission: I have personally reviewed following labs and imaging studies  CBC: Recent Labs  Lab 11/25/21 1222  WBC 18.8*  HGB 9.9*  HCT 30.2*  MCV 96.5  PLT 007*   Basic Metabolic Panel: Recent Labs  Lab 11/25/21 1222  NA 134*  K 3.3*  CL 94*  CO2 29  GLUCOSE 123*  BUN 15  CREATININE 0.39*  CALCIUM 8.5*   GFR: Estimated Creatinine Clearance: 52.3 mL/min (A) (by C-G formula based on SCr of 0.39 mg/dL (L)). Liver Function Tests: Recent Labs  Lab 11/25/21 1222  AST 20  ALT 9  ALKPHOS 173*  BILITOT 0.8  PROT 6.2*  ALBUMIN 2.2*   No results for input(s): LIPASE, AMYLASE in the last 168 hours. No results for input(s): AMMONIA in the last 168 hours. Coagulation Profile: Recent Labs  Lab 11/25/21 1222  INR 2.2*    Cardiac Enzymes: No results for input(s): CKTOTAL, CKMB, CKMBINDEX, TROPONINI in the last 168 hours. BNP (last 3 results) No results for input(s): PROBNP in the last 8760 hours. HbA1C: No results for input(s): HGBA1C in the last 72 hours. CBG: No results for input(s): GLUCAP in the last 168 hours. Lipid Profile: No results for input(s): CHOL, HDL, LDLCALC, TRIG, CHOLHDL, LDLDIRECT in the last 72 hours. Thyroid Function Tests: No results for input(s): TSH, T4TOTAL, FREET4, T3FREE, THYROIDAB in the last 72 hours. Anemia Panel: No results for input(s): VITAMINB12, FOLATE, FERRITIN, TIBC, IRON, RETICCTPCT in the last 72 hours. Urine analysis:    Component Value Date/Time   COLORURINE YELLOW 11/25/2021 1447   APPEARANCEUR CLEAR 11/25/2021 1447   APPEARANCEUR Clear 12/21/2018 1041   LABSPEC 1.020 11/25/2021 1447   LABSPEC 1.017 04/23/2015 0000   PHURINE 7.5 11/25/2021 1447   GLUCOSEU NEGATIVE 11/25/2021 1447   HGBUR TRACE (A) 11/25/2021 1447   BILIRUBINUR NEGATIVE 11/25/2021 1447   BILIRUBINUR Negative 12/21/2018 [place] 11/25/2021 1447   PROTEINUR NEGATIVE 11/25/2021 1447   NITRITE POSITIVE (A) 11/25/2021 1447   LEUKOCYTESUR NEGATIVE 11/25/2021 1447    Radiological Exams on Admission: CT HEAD WO CONTRAST (5MM)  Result Date: 11/25/2021 CLINICAL DATA:  Mental status change.  Unknown cause. EXAM: CT HEAD WITHOUT  CONTRAST TECHNIQUE: Contiguous axial images were obtained from the base of the skull through the vertex without intravenous contrast. RADIATION DOSE REDUCTION: This exam was performed according to the departmental dose-optimization program which includes automated exposure control, adjustment of the mA and/or kV according to patient size and/or use of iterative reconstruction technique. COMPARISON:  CT brain 06/15/2018, MRI brain 06/27/2021; CTA head 06/15/2018 MRI brain 07/17/2020 FINDINGS: Brain: There is mild cortical atrophy, within normal limits for  patient age. The ventricles are normal in configuration. The basilar cisterns are patent. No mass, mass effect, or midline shift. No acute intracranial hemorrhage is seen. No abnormal extra-axial fluid collection. Preservation of the normal cortical gray-white interface without CT evidence of an acute major vascular territorial cortical based infarction. Vascular: No hyperdense vessel or unexpected calcification. Skull: Normal. Negative for fracture or focal lesion. Sinuses/Orbits: The visualized orbits are unremarkable. The visualized paranasal sinuses and mastoid air cells are clear. 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 There are surgical clips in this region. Other: None. IMPRESSION: 1. Postsurgical changes of right frontal craniotomy for prior distal right M1 aneurysm intervention. 2. No acute intracranial process. Electronically Signed   By: Yvonne Kendall M.D.   On: 11/25/2021 14:14   CT CHEST WO CONTRAST  Result Date: 11/25/2021 CLINICAL DATA:  Pneumonia. EXAM: CT CHEST WITHOUT CONTRAST TECHNIQUE: Multidetector CT imaging of the chest was performed following the standard protocol without IV contrast. RADIATION DOSE REDUCTION: This exam was performed according to the departmental dose-optimization program which includes automated exposure control, adjustment of the mA and/or kV according to patient size and/or use of iterative reconstruction technique. COMPARISON:  Radiograph of same day.  CT scan of September 11, 2021. FINDINGS: Cardiovascular: Atherosclerosis of thoracic aorta is noted without aneurysm formation. Normal cardiac size. No pericardial effusion. Mediastinum/Nodes: No enlarged mediastinal or axillary lymph nodes. Thyroid gland, trachea, and esophagus demonstrate no significant findings. Lungs/Pleura: No pneumothorax or pleural effusion is noted. Stable reticular densities  are noted in the right upper lobe most consistent with scarring or chronic interstitial lung disease. Stable probable scarring is noted in right middle lobe and lingular segment of left upper lobe. However, increased left posterior basilar opacity is noted concerning for atelectasis or possibly pneumonia. Multiple nodular opacities are noted posteriorly in the right lower lobe concerning for atypical or mycobacterial infection. Upper Abdomen: Hepatic steatosis. Musculoskeletal: No chest wall mass or suspicious bone lesions identified. IMPRESSION: Increased left posterior basilar opacity is noted concerning for atelectasis or possibly pneumonia. There is also the interval development of multiple irregular nodular opacities posteriorly in the right lung base concerning for atypical or mycobacterial infection. Stable bilateral upper lobe opacities are noted most consistent with scarring or chronic interstitial lung disease. Hepatic steatosis. Aortic Atherosclerosis (ICD10-I70.0). Electronically Signed   By: Marijo Conception M.D.   On: 11/25/2021 16:39   CT CERVICAL SPINE WO CONTRAST  Result Date: 11/25/2021 CLINICAL DATA:  Mental status change, unknown cause. EXAM: CT CERVICAL SPINE WITHOUT CONTRAST TECHNIQUE: Multidetector CT imaging of the cervical spine was performed without intravenous contrast. Multiplanar CT image reconstructions were also generated. RADIATION DOSE REDUCTION: This exam was performed according to the departmental dose-optimization program which includes automated exposure control, adjustment of the mA and/or kV according to patient size and/or use of iterative reconstruction technique. COMPARISON:  None. FINDINGS: Alignment: There is 3 mm grade 1 anterolisthesis of C2  on C3, 3 mm retrolisthesis of C3 on C4, 2 mm retrolisthesis of C4 on C5, 2 mm retrolisthesis of C5 on C6, and 2 mm grade 1 anterolisthesis of C7 on T1. The facet joints are appropriately aligned. Skull base and vertebrae: The  atlantodens interval is intact. Vertebral body heights are maintained. Moderate C3-4, moderate to severe left-greater-than-right C4-5, moderate C5-6, severe C6-7, and mild C7-T1 disc space narrowing. No acute fracture is seen. Soft tissues and spinal canal: No central canal hyperdensity is seen to indicate CT evidence of an acute epidural hematoma. Disc levels: Multilevel degenerative changes including disc space narrowing, uncovertebral hypertrophy, and facet hypertrophy contribute to moderate to severe right C2-3, severe right and moderate left C3-4, moderate left C4-5, moderate left greater than right C5-6, and mild-to-moderate right and mild left C6-7 neural foraminal stenosis. Mild left C5-6 lateral recess narrowing. Mild C4-5 and minimal C3-4 central canal stenosis. Upper chest: There is ground-glass and curvilinear scarring within the left-greater-than-right lung apices not significantly changed compared to CTA chest 09/11/2021. Other: Partial visualization of left internal jugular catheter related to the patient's left chest wall port a catheter is seen on today's chest radiograph. No cervical chain lymphadenopathy is seen. IMPRESSION:: IMPRESSION: 1. Multilevel spondylolisthesis at C2-3 through C5-6 and C7 on T1. 2. Multilevel degenerative disc and joint changes as above with multilevel neural foraminal stenosis. 3. No acute fracture. Electronically Signed   By: Yvonne Kendall M.D.   On: 11/25/2021 14:21   DG Chest Portable 1 View  Result Date: 11/25/2021 CLINICAL DATA:  Altered mental status after falling yesterday and striking head. EXAM: PORTABLE CHEST 1 VIEW COMPARISON:  Radiographs 09/11/2021 and 08/07/2021.  CT 09/11/2021. FINDINGS: 1333 hours. Left IJ Port-A-Cath tip is unchanged at the level of the mid SVC. The heart size and mediastinal contours are stable. Stable chronic lung disease with emphysema and scattered ground-glass opacities. No superimposed edema, confluent airspace opacity, pleural  effusion or pneumothorax. The bones appear unchanged. IMPRESSION: Stable radiographic appearance of the chest with chronic interstitial lung disease. No evidence of acute cardiopulmonary process. Electronically Signed   By: Richardean Sale M.D.   On: 11/25/2021 13:47   CT MAXILLOFACIAL WO CONTRAST  Result Date: 11/25/2021 CLINICAL DATA:  Mental status change of unknown cause. History of maxillary sinus cancer. EXAM: CT MAXILLOFACIAL WITHOUT CONTRAST TECHNIQUE: Multidetector CT imaging of the maxillofacial structures was performed. Multiplanar CT image reconstructions were also generated. RADIATION DOSE REDUCTION: This exam was performed according to the departmental dose-optimization program which includes automated exposure control, adjustment of the mA and/or kV according to patient size and/or use of iterative reconstruction technique. COMPARISON:  Maxillofacial CT 12/03/2020.  PET scan 07/08/2021. FINDINGS: Osseous: Previous extensive mid face resection on the right for treatment of a maxillary sinus malignancy. Since the prior studies, there has been progressive lytic destruction of the skull base in the region of the carotid canal and foramen ovale, with tumor extension into the right division of the sphenoid sinus, medial aspect of the right middle cranial fossa and probably cavernous sinus region. Orbits: Orbits remain negative Sinuses: Tumor extension into the right division of the sphenoid sinus as noted above. Chronic extirpation of the right maxillary sinus and maxilla. Soft tissues: No other area of definable soft tissue tumor progression. Limited intracranial: As above. Additional note of pterional craniotomy and right MCA aneurysm clip. IMPRESSION: Tumor progression with destruction of the skull base on the right in the region of the foramen ovale and carotid canal with tumor  extension into the right division of the sphenoid sinus and medial aspect of the middle cranial fossa. These abnormalities  could be better evaluated with MRI. Electronically Signed   By: Nelson Chimes M.D.   On: 11/25/2021 14:46     Assessment/Plan Principal Problem:   Sepsis ([place]) Active Problems:   Tobacco abuse   Head and neck cancer (HCC)   Atrial fibrillation with RVR (HCC)   Hypokalemia   Moderate protein-calorie malnutrition ([place])   Fall      Patient is a 66 year old female admitted to the hospital for sepsis from a urinary source    Sepsis from urinary source As evidenced by fever with a T-max of 102, tachycardia, leukocytosis, lactic acidosis and pyuria. Continue aggressive IV fluid resuscitation Treat patient empirically with Rocephin 2 g IV daily Follow-up results of blood and urine culture     History of paroxysmal A. Fib Continue metoprolol for rate control Continue apixaban as primary prophylaxis for an acute stroke       History of head and neck cancer with neoplasm related pain Continue fentanyl patch and oxycodone Patient is no longer receiving chemotherapy/immunotherapy due to declining functional status We will request palliative care consult     Hypokalemia Supplement potassium    Moderate protein calorie malnutrition (BMI 17.04) Secondary to dysphagia related to head and neck cancer Continue Megace Continue Ensure with meals     Nicotine dependence Smoking cessation has been discussed with patient Will place patient on a nicotine transdermal patch 7 mg daily     Fall Most likely secondary to weakness from chronic illness with superimposed sepsis Place patient on fall precautions   DVT prophylaxis: Apixaban Code Status: full code  Family Communication: Greater than 50% of time was spent discussing plan of care with patient at the bedside.  All questions and concerns have been addressed.  She verbalizes understanding and agrees to the plan CODE STATUS was discussed and she wishes to be resuscitated Disposition Plan: Back to previous home  environment Consults called: Palliative care, oncology Status:At the time of admission, it appears that the appropriate admission status for this patient is inpatient. This is judged to be reasonable and necessary to provide the required intensity of service to ensure the patient's safety given the presenting symptoms, physical exam findings, and initial radiographic and laboratory data in the context of their comorbid conditions. Patient requires inpatient status due to high intensity of service, high risk for further deterioration and high frequency of surveillance required.     Collier Bullock MD [facility]     11/25/2021, 4:57 PM

## 2021-11-25 NOTE — ED Triage Notes (Addendum)
First Nurse Pt fell yesterday and is on blood thinners, she was not seen yesterday,for this. Care giver reports that she is not acting right more lethargic than normal.   Her CBG 219 Temp 102.6 HR 90 B/P 134/67

## 2021-11-26 ENCOUNTER — Encounter: Payer: Self-pay | Admitting: Internal Medicine

## 2021-11-26 DIAGNOSIS — C310 Malignant neoplasm of maxillary sinus: Secondary | ICD-10-CM | POA: Diagnosis not present

## 2021-11-26 DIAGNOSIS — C760 Malignant neoplasm of head, face and neck: Secondary | ICD-10-CM

## 2021-11-26 DIAGNOSIS — Z515 Encounter for palliative care: Secondary | ICD-10-CM | POA: Diagnosis not present

## 2021-11-26 DIAGNOSIS — A419 Sepsis, unspecified organism: Secondary | ICD-10-CM | POA: Diagnosis not present

## 2021-11-26 DIAGNOSIS — I4891 Unspecified atrial fibrillation: Secondary | ICD-10-CM | POA: Diagnosis not present

## 2021-11-26 DIAGNOSIS — E440 Moderate protein-calorie malnutrition: Secondary | ICD-10-CM | POA: Diagnosis not present

## 2021-11-26 DIAGNOSIS — Z720 Tobacco use: Secondary | ICD-10-CM | POA: Diagnosis not present

## 2021-11-26 LAB — PROTIME-INR
INR: 1.5 — ABNORMAL HIGH (ref 0.8–1.2)
Prothrombin Time: 18.5 seconds — ABNORMAL HIGH (ref 11.4–15.2)

## 2021-11-26 LAB — CORTISOL-AM, BLOOD: Cortisol - AM: 28.8 ug/dL — ABNORMAL HIGH (ref 6.7–22.6)

## 2021-11-26 LAB — PROCALCITONIN: Procalcitonin: 0.13 ng/mL

## 2021-11-26 LAB — MAGNESIUM: Magnesium: 1.7 mg/dL (ref 1.7–2.4)

## 2021-11-26 MED ORDER — POTASSIUM CHLORIDE 20 MEQ PO PACK
20.0000 meq | PACK | Freq: Once | ORAL | Status: DC
  Filled 2021-11-26: qty 1

## 2021-11-26 MED ORDER — INFLUENZA VAC A&B SA ADJ QUAD 0.5 ML IM PRSY
0.5000 mL | PREFILLED_SYRINGE | INTRAMUSCULAR | Status: AC
  Administered 2021-11-27: 0.5 mL via INTRAMUSCULAR
  Filled 2021-11-26: qty 0.5

## 2021-11-26 MED ORDER — MAGNESIUM SULFATE 2 GM/50ML IV SOLN
2.0000 g | Freq: Once | INTRAVENOUS | Status: AC
  Administered 2021-11-26: 16:00:00 2 g via INTRAVENOUS
  Filled 2021-11-26: qty 50

## 2021-11-26 MED ORDER — METOPROLOL TARTRATE 5 MG/5ML IV SOLN
5.0000 mg | Freq: Once | INTRAVENOUS | Status: AC | PRN
  Administered 2021-11-26: 5 mg via INTRAVENOUS
  Filled 2021-11-26: qty 5

## 2021-11-26 MED ORDER — METOPROLOL TARTRATE 25 MG PO TABS
25.0000 mg | ORAL_TABLET | Freq: Two times a day (BID) | ORAL | Status: DC
  Administered 2021-11-26 – 2021-11-27 (×2): 25 mg via ORAL
  Filled 2021-11-26 (×2): qty 1

## 2021-11-26 NOTE — Consult Note (Signed)
Hematology/Oncology Consult note [facility] Telephone:([phone] [phone]  Patient Care Team: Pcp, No as PCP - General Rockey Situ Kathlene November, MD as PCP - Cardiology (Cardiology) Earlie Server, MD as Consulting Physician (Oncology)   Name of the patient: Shannon Schroeder  [id number]  23-Nov-1955   Date of visit: 11/26/21 REASON FOR COSULTATION:  History of head and neck cancer admitted for sepsis History of presenting illness-  66 y.o. female with PMH listed at below who presents to ER for evaluation of weakness and a fall.  Patient was noted to be in rapid A. fib which improved after IV fluid hydration.  She was found to have fever of 102, tachycardic, elevated lactic acid level, leukocytosis and high diarrhea.  Currently admitted for urosepsis.  On IV antibiotics.  Patient is known to oncology service for unresectable recurrent maxillary cancer.  She was last seen by me in December 2022.  At that time she had significant weight loss, very poor oral intake and the plan was for patient to repeat an MRI brain-trigeminal nerve protocol or facial protocol for evaluation of disease status, possible additional radiation if feasible, additional treatment with cetuximab +/- Keytruda if she is able to improve her functional status, nutrition status.  We also discussed about comfort care/hospice if she continues to deteriorate.  Patient rescheduled her MRI for a couple of times and never had images done.    Hematology oncology was consulted to see this patient during this admission. She continues to have swallowing difficulty has been eating pured diet.  Sometimes she coughs..  She reports that currently her facial pain is well controlled.  Review of Systems  Constitutional:  Positive for appetite change, fatigue and unexpected weight change.  HENT:          Right side facial pain  Respiratory:  Positive for cough. Negative for shortness of breath.   Cardiovascular:   Negative for chest pain.  Gastrointestinal:  Negative for abdominal pain.  Skin:  Negative for rash.  Neurological:  Positive for light-headedness.  Psychiatric/Behavioral:  Negative for confusion.    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   Encounter for antineoplastic immunotherapy 04/23/2021   Moderate protein-calorie malnutrition ([place]) 04/14/2021   Dehydration 10/14/2020   SVT (supraventricular tachycardia) ([place])    Hypomagnesemia    Dysphagia    Sepsis ([place]) 09/06/2020   Drug-induced constipation 08/26/2020   Hypotension 08/12/2020   Encounter for antineoplastic chemotherapy 08/08/2020   Atrial fibrillation with RVR ([place]) 08/08/2020   Elevated troponin 08/08/2020   Leukocytosis 08/08/2020   Hypokalemia 08/08/2020   SIRS (systemic inflammatory response syndrome) ([place]) 08/08/2020   Goals of care, counseling/discussion 08/06/2020   Head and neck cancer ([place]) 07/29/2020   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[place]) 07/29/2020   Weight loss 07/29/2020   Neoplasm related pain 07/29/2020   Abscess of upper gum 09/12/2019  History of atrial fibrillation 03/22/2019   Bipolar affective disorder, current episode mixed ([place]) 06/29/2018   Cerebral edema ([place]) 06/25/2018   Hypertensive emergency 06/16/2018   Subarachnoid bleed ([place]) 06/16/2018   TIA (transient ischemic attack) 06/15/2018   Tobacco abuse  06/02/2018   COPD (chronic obstructive pulmonary disease) ([place]) 04/21/2018   URI (upper respiratory infection) 03/31/2018   Hypertension 03/31/2018   Tachycardia 02/02/2018   Rash of body 02/02/2018   Hyponatremia 02/02/2018     Past Medical History:  Diagnosis Date   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    Anxiety    Arthritis    Bipolar disorder (HCC)    COPD (chronic obstructive pulmonary disease) (HCC)    NO INHALERS   Depression    Dysrhythmia    H/O V TACH   Head and neck cancer ([place]) 07/29/2020   Headache    H/O MIGRAINES   Hypertension    Seizures ([place])    X1 AFTER FALL     Past Surgical History:  Procedure Laterality Date   ABDOMINAL HYSTERECTOMY     partial   BREAST SURGERY     biopsy   CARDIAC CATHETERIZATION     X 2   CARPAL TUNNEL RELEASE Right    CARPECTOMY HAND Right    CATARACT EXTRACTION W/PHACO Left 03/08/2018   Procedure: CATARACT EXTRACTION PHACO AND INTRAOCULAR LENS PLACEMENT ([place]);  Surgeon: Birder Robson, MD;  Location: [facility] ORS;  Service: Ophthalmology;  Laterality: Left;  [place] 00:55 AP% 13.5 CDE 7.52 Fluid pack lot # [id number]   CATARACT EXTRACTION W/PHACO Right 04/05/2018   Procedure: CATARACT EXTRACTION PHACO AND INTRAOCULAR LENS PLACEMENT (IOC);  Surgeon: Birder Robson, MD;  Location: [facility] ORS;  Service: Ophthalmology;  Laterality: Right;  [place] 00:36 AP% 15.9 CDE 5.72 Fluid pack lot # [id number]   CEREBRAL ANEURYSM REPAIR     CHOLECYSTECTOMY     COLONOSCOPY WITH PROPOFOL N/A 04/26/2019   Procedure: COLONOSCOPY WITH PROPOFOL;  Surgeon: Jonathon Bellows, MD;  Location: [facility];  Service: Gastroenterology;  Laterality: N/A;   PORTACATH PLACEMENT Left 08/05/2020   Procedure: INSERTION PORT-A-CATH;  Surgeon: Herbert Pun, MD;  Location: [facility] ORS;  Service: General;  Laterality: Left;   TUBAL LIGATION      Social History   Socioeconomic History   Marital status: Divorced     Spouse name: Not on file   Number of children: Not on file   Years of education: 16   Highest education level: Bachelor's degree (e.g., BA, AB, BS)  Occupational History   Occupation: farmer  Tobacco Use   Smoking status: Every Day    Packs/day: 0.25    Years: 50.00    Pack years: 12.50    Types: Cigarettes   Smokeless tobacco: Never  Vaping Use   Vaping Use: Never used  Substance and Sexual Activity   Alcohol use: No   Drug use: No   Sexual activity: Not Currently  Other Topics Concern   Not on file  Social History Narrative   Pt lives at [facility].    Social Determinants of Health   Financial Resource Strain: Not on file  Food Insecurity: Not on file  Transportation Needs: Not on file  Physical Activity: Not on file  Stress: Not on file  Social Connections: Not on file  Intimate Partner Violence: Not on file     Family History  Problem Relation Age of Onset   Hypertension Mother  Heart failure Mother    Hypertension Brother    Stroke Brother    Hypertension Son    Emphysema Maternal Aunt    Hypertension Paternal Aunt    Hypertension Paternal Uncle    Hypertension Maternal Grandmother    Hypertension Paternal Grandmother      Current Facility-Administered Medications:    acetaminophen (TYLENOL) tablet 650 mg, 650 mg, Oral, Q6H PRN **OR** acetaminophen (TYLENOL) suppository 650 mg, 650 mg, Rectal, Q6H PRN, Agbata, Tochukwu, MD   albuterol (PROVENTIL) (2.5 MG/3ML) 0.083% nebulizer solution 2.5 mg, 2.5 mg, Nebulization, Q6H PRN, Agbata, Tochukwu, MD   apixaban (ELIQUIS) tablet 5 mg, 5 mg, Oral, BID, Agbata, Tochukwu, MD, 5 mg at 11/26/21 0740   cefTRIAXone (ROCEPHIN) 2 g in sodium chloride 0.9 % 100 mL IVPB, 2 g, Intravenous, Q24H, Posey Pronto, Sona, MD   feeding supplement (ENSURE ENLIVE / ENSURE PLUS) liquid 237 mL, 237 mL, Oral, TID BM, Agbata, Tochukwu, MD, 237 mL at 11/26/21 0941   fentaNYL (DURAGESIC) 50 MCG/HR 2 patch, 2 patch, Transdermal, Q72H,  Agbata, Tochukwu, MD   gabapentin (NEURONTIN) capsule 600 mg, 600 mg, Oral, TID, Agbata, Tochukwu, MD, 600 mg at 11/26/21 0741   [START ON 11/27/2021] influenza vaccine adjuvanted (FLUAD) injection 0.5 mL, 0.5 mL, Intramuscular, Tomorrow-1000, Agbata, Tochukwu, MD   megestrol (MEGACE) 400 MG/10ML suspension 625 mg, 625 mg, Oral, Daily, Agbata, Tochukwu, MD   melatonin tablet 10 mg, 10 mg, Oral, QHS PRN, Agbata, Tochukwu, MD   metoprolol tartrate (LOPRESSOR) tablet 12.5 mg, 12.5 mg, Oral, BID, Agbata, Tochukwu, MD, 12.5 mg at 11/26/21 0740   mometasone-formoterol (DULERA) 100-5 MCG/ACT inhaler 2 puff, 2 puff, Inhalation, BID, Agbata, Tochukwu, MD   multivitamin with minerals tablet, , Oral, Daily, Agbata, Tochukwu, MD   nicotine (NICODERM CQ - dosed in mg/24 hr) patch 7 mg, 7 mg, Transdermal, Daily, Agbata, Tochukwu, MD   ondansetron (ZOFRAN) tablet 4 mg, 4 mg, Oral, Q6H PRN **OR** ondansetron (ZOFRAN) injection 4 mg, 4 mg, Intravenous, Q6H PRN, Agbata, Tochukwu, MD   Oxcarbazepine (TRILEPTAL) tablet 600 mg, 600 mg, Oral, BID, Agbata, Tochukwu, MD, 600 mg at 11/26/21 0741   oxyCODONE (Oxy IR/ROXICODONE) immediate release tablet 30 mg, 30 mg, Oral, Q4H PRN, Agbata, Tochukwu, MD, 30 mg at 11/26/21 0943   polyethylene glycol (MIRALAX / GLYCOLAX) packet 17 g, 17 g, Oral, Daily PRN, Agbata, Tochukwu, MD   potassium chloride (KLOR-CON) packet 20 mEq, 20 mEq, Oral, Once, Fritzi Mandes, MD   traZODone (DESYREL) tablet 50-100 mg, 50-100 mg, Oral, QHS PRN, Agbata, Tochukwu, MD  Facility-Administered Medications Ordered in Other Encounters:    heparin lock flush 100 UNIT/ML injection, , , ,    heparin lock flush 100 UNIT/ML injection, , , ,    heparin lock flush 100 unit/mL, 500 Units, Intravenous, Once, Earlie Server, MD   heparin lock flush 100 unit/mL, 500 Units, Intracatheter, Once PRN, Earlie Server, MD   sodium chloride flush (NS) 0.9 % injection 10 mL, 10 mL, Intravenous, PRN, Earlie Server, MD, 10 mL at 10/10/20  1040   sodium chloride flush (NS) 0.9 % injection 10 mL, 10 mL, Intravenous, PRN, Earlie Server, MD, 10 mL at 11/13/20 0836   sodium chloride flush (NS) 0.9 % injection 10 mL, 10 mL, Intravenous, PRN, Earlie Server, MD, 10 mL at 12/30/20 0838   sodium chloride flush (NS) 0.9 % injection 10 mL, 10 mL, Intravenous, PRN, Earlie Server, MD, 10 mL at 01/14/21 0921   sodium chloride flush (NS) 0.9 % injection 10 mL,  10 mL, Intravenous, PRN, Earlie Server, MD, 10 mL at 06/11/21 0815   sodium chloride flush (NS) 0.9 % injection 10 mL, 10 mL, Intravenous, PRN, Earlie Server, MD, 10 mL at 07/09/21 0814   Physical exam:  Vitals:   11/26/21 0615 11/26/21 0616 11/26/21 0854 11/26/21 1142  BP: (!) 155/87  (!) 152/100 (!) 114/51  Pulse: 78 80 87 (!) 55  Resp: 16 16 19 16   Temp:    98 F (36.7 C)  TempSrc:    Oral  SpO2: 100% 100% 100% 100%  Weight:      Height:       Physical Exam Constitutional:      General: She is not in acute distress.    Appearance: She is not diaphoretic.  HENT:     Head: Normocephalic.     Comments:  Chronic right side palate deficiency. Right facial swelling    Mouth/Throat:     Pharynx: No oropharyngeal exudate.  Eyes:     General: No scleral icterus.    Pupils: Pupils are equal, round, and reactive to light.     Comments: Right ptosis  Cardiovascular:     Rate and Rhythm: Normal rate. Rhythm irregular.     Heart sounds: No murmur heard. Pulmonary:     Effort: Pulmonary effort is normal. No respiratory distress.     Breath sounds: No rales.  Chest:     Chest wall: No tenderness.  Abdominal:     General: There is no distension.     Palpations: Abdomen is soft.     Tenderness: There is no abdominal tenderness.  Musculoskeletal:        General: Normal range of motion.     Cervical back: Normal range of motion and neck supple.  Skin:    General: Skin is warm and dry.  Neurological:     Mental Status: She is alert. Mental status is at baseline.     Motor: No abnormal muscle tone.      Comments: Asymmetric face  Psychiatric:        Mood and Affect: Affect normal.        CMP Latest Ref Rng & Units 11/25/2021  Glucose 70 - 99 mg/dL 123(H)  BUN 8 - 23 mg/dL 15  Creatinine 0.44 - 1.00 mg/dL 0.39(L)  Sodium 135 - 145 mmol/L 134(L)  Potassium 3.5 - 5.1 mmol/L 3.3(L)  Chloride 98 - 111 mmol/L 94(L)  CO2 22 - 32 mmol/L 29  Calcium 8.9 - 10.3 mg/dL 8.5(L)  Total Protein 6.5 - 8.1 g/dL 6.2(L)  Total Bilirubin 0.3 - 1.2 mg/dL 0.8  Alkaline Phos 38 - 126 U/L 173(H)  AST 15 - 41 U/L 20  ALT 0 - 44 U/L 9   CBC Latest Ref Rng & Units 11/25/2021  WBC 4.0 - 10.5 K/uL 18.8(H)  Hemoglobin 12.0 - 15.0 g/dL 9.9(L)  Hematocrit 36.0 - 46.0 % 30.2(L)  Platelets 150 - 400 K/uL 680(H)    RADIOGRAPHIC STUDIES: I have personally reviewed the radiological images as listed and agreed with the findings in the report. CT HEAD WO CONTRAST (5MM)  Result Date: 11/25/2021 CLINICAL DATA:  Mental status change.  Unknown cause. EXAM: CT HEAD WITHOUT CONTRAST TECHNIQUE: Contiguous axial images were obtained from the base of the skull through the vertex without intravenous contrast. RADIATION DOSE REDUCTION: This exam was performed according to the departmental dose-optimization program which includes automated exposure control, adjustment of the mA and/or kV according to patient size and/or use of  iterative reconstruction technique. COMPARISON:  CT brain 06/15/2018, MRI brain 06/27/2021; CTA head 06/15/2018 MRI brain 07/17/2020 FINDINGS: Brain: There is mild cortical atrophy, within normal limits for patient age. The ventricles are normal in configuration. The basilar cisterns are patent. No mass, mass effect, or midline shift. No acute intracranial hemorrhage is seen. No abnormal extra-axial fluid collection. Preservation of the normal cortical gray-white interface without CT evidence of an acute major vascular territorial cortical based infarction. Vascular: No hyperdense vessel or unexpected  calcification. Skull: Normal. Negative for fracture or focal lesion. Sinuses/Orbits: The visualized orbits are unremarkable. The visualized paranasal sinuses and mastoid air cells are clear. 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 There are surgical clips in this region. Other: None. IMPRESSION: 1. Postsurgical changes of right frontal craniotomy for prior distal right M1 aneurysm intervention. 2. No acute intracranial process. Electronically Signed   By: Yvonne Kendall M.D.   On: 11/25/2021 14:14   CT CHEST WO CONTRAST  Result Date: 11/25/2021 CLINICAL DATA:  Pneumonia. EXAM: CT CHEST WITHOUT CONTRAST TECHNIQUE: Multidetector CT imaging of the chest was performed following the standard protocol without IV contrast. RADIATION DOSE REDUCTION: This exam was performed according to the departmental dose-optimization program which includes automated exposure control, adjustment of the mA and/or kV according to patient size and/or use of iterative reconstruction technique. COMPARISON:  Radiograph of same day.  CT scan of September 11, 2021. FINDINGS: Cardiovascular: Atherosclerosis of thoracic aorta is noted without aneurysm formation. Normal cardiac size. No pericardial effusion. Mediastinum/Nodes: No enlarged mediastinal or axillary lymph nodes. Thyroid gland, trachea, and esophagus demonstrate no significant findings. Lungs/Pleura: No pneumothorax or pleural effusion is noted. Stable reticular densities are noted in the right upper lobe most consistent with scarring or chronic interstitial lung disease. Stable probable scarring is noted in right middle lobe and lingular segment of left upper lobe. However, increased left posterior basilar opacity is noted concerning for atelectasis or possibly pneumonia. Multiple nodular opacities are noted posteriorly in the right lower lobe concerning for atypical  or mycobacterial infection. Upper Abdomen: Hepatic steatosis. Musculoskeletal: No chest wall mass or suspicious bone lesions identified. IMPRESSION: Increased left posterior basilar opacity is noted concerning for atelectasis or possibly pneumonia. There is also the interval development of multiple irregular nodular opacities posteriorly in the right lung base concerning for atypical or mycobacterial infection. Stable bilateral upper lobe opacities are noted most consistent with scarring or chronic interstitial lung disease. Hepatic steatosis. Aortic Atherosclerosis (ICD10-I70.0). Electronically Signed   By: Marijo Conception M.D.   On: 11/25/2021 16:39   CT CERVICAL SPINE WO CONTRAST  Result Date: 11/25/2021 CLINICAL DATA:  Mental status change, unknown cause. EXAM: CT CERVICAL SPINE WITHOUT CONTRAST TECHNIQUE: Multidetector CT imaging of the cervical spine was performed without intravenous contrast. Multiplanar CT image reconstructions were also generated. RADIATION DOSE REDUCTION: This exam was performed according to the departmental dose-optimization program which includes automated exposure control, adjustment of the mA and/or kV according to patient size and/or use of iterative reconstruction technique. COMPARISON:  None. FINDINGS: Alignment: There is 3 mm grade 1 anterolisthesis of C2 on C3, 3 mm retrolisthesis of C3 on C4, 2 mm retrolisthesis of C4 on C5, 2 mm retrolisthesis of C5 on C6, and 2 mm grade 1 anterolisthesis of C7 on T1. The facet joints are appropriately aligned. Skull base and vertebrae: The atlantodens interval is intact. Vertebral body  heights are maintained. Moderate C3-4, moderate to severe left-greater-than-right C4-5, moderate C5-6, severe C6-7, and mild C7-T1 disc space narrowing. No acute fracture is seen. Soft tissues and spinal canal: No central canal hyperdensity is seen to indicate CT evidence of an acute epidural hematoma. Disc levels: Multilevel degenerative changes including  disc space narrowing, uncovertebral hypertrophy, and facet hypertrophy contribute to moderate to severe right C2-3, severe right and moderate left C3-4, moderate left C4-5, moderate left greater than right C5-6, and mild-to-moderate right and mild left C6-7 neural foraminal stenosis. Mild left C5-6 lateral recess narrowing. Mild C4-5 and minimal C3-4 central canal stenosis. Upper chest: There is ground-glass and curvilinear scarring within the left-greater-than-right lung apices not significantly changed compared to CTA chest 09/11/2021. Other: Partial visualization of left internal jugular catheter related to the patient's left chest wall port a catheter is seen on today's chest radiograph. No cervical chain lymphadenopathy is seen. IMPRESSION:: IMPRESSION: 1. Multilevel spondylolisthesis at C2-3 through C5-6 and C7 on T1. 2. Multilevel degenerative disc and joint changes as above with multilevel neural foraminal stenosis. 3. No acute fracture. Electronically Signed   By: Yvonne Kendall M.D.   On: 11/25/2021 14:21   DG Chest Portable 1 View  Result Date: 11/25/2021 CLINICAL DATA:  Altered mental status after falling yesterday and striking head. EXAM: PORTABLE CHEST 1 VIEW COMPARISON:  Radiographs 09/11/2021 and 08/07/2021.  CT 09/11/2021. FINDINGS: 1333 hours. Left IJ Port-A-Cath tip is unchanged at the level of the mid SVC. The heart size and mediastinal contours are stable. Stable chronic lung disease with emphysema and scattered ground-glass opacities. No superimposed edema, confluent airspace opacity, pleural effusion or pneumothorax. The bones appear unchanged. IMPRESSION: Stable radiographic appearance of the chest with chronic interstitial lung disease. No evidence of acute cardiopulmonary process. Electronically Signed   By: Richardean Sale M.D.   On: 11/25/2021 13:47   CT MAXILLOFACIAL WO CONTRAST  Result Date: 11/25/2021 CLINICAL DATA:  Mental status change of unknown cause. History of maxillary  sinus cancer. EXAM: CT MAXILLOFACIAL WITHOUT CONTRAST TECHNIQUE: Multidetector CT imaging of the maxillofacial structures was performed. Multiplanar CT image reconstructions were also generated. RADIATION DOSE REDUCTION: This exam was performed according to the departmental dose-optimization program which includes automated exposure control, adjustment of the mA and/or kV according to patient size and/or use of iterative reconstruction technique. COMPARISON:  Maxillofacial CT 12/03/2020.  PET scan 07/08/2021. FINDINGS: Osseous: Previous extensive mid face resection on the right for treatment of a maxillary sinus malignancy. Since the prior studies, there has been progressive lytic destruction of the skull base in the region of the carotid canal and foramen ovale, with tumor extension into the right division of the sphenoid sinus, medial aspect of the right middle cranial fossa and probably cavernous sinus region. Orbits: Orbits remain negative Sinuses: Tumor extension into the right division of the sphenoid sinus as noted above. Chronic extirpation of the right maxillary sinus and maxilla. Soft tissues: No other area of definable soft tissue tumor progression. Limited intracranial: As above. Additional note of pterional craniotomy and right MCA aneurysm clip. IMPRESSION: Tumor progression with destruction of the skull base on the right in the region of the foramen ovale and carotid canal with tumor extension into the right division of the sphenoid sinus and medial aspect of the middle cranial fossa. These abnormalities could be better evaluated with MRI. Electronically Signed   By: Nelson Chimes M.D.   On: 11/25/2021 14:46    Assessment and plan-   Unresectable recurrent maxillary  sinus cancer She is currently off any chemotherapy treatments due to declining of functional status and her preference. I had a discussion with patient.  She is not interested in proceeding with any additional imaging or  antineoplastic treatments at this point. She has discussed with palliative care service about goals of care, CODE STATUS.  She prefers to remain full code..  She is not mentally ready for home hospice.  Continue supportive care.  Prognosis is very poor.  Sepsis due to UTI.  Continue IV antibiotics.  Managed by primary team. Neoplasm related pain, patient reports pain is relatively well controlled. Weight loss, continue nutrition supplements.  Speech swallow evaluation.    Thank you for allowing me to participate in the care of this patient.   Earlie Server, MD, PhD  11/26/2021

## 2021-11-26 NOTE — TOC Initial Note (Signed)
Transition of Care [facility]) - Initial/Assessment Note    Patient Details  Name: Shannon Schroeder MRN: [id number] Date of Birth: 05-11-1956  Transition of Care [facility]) CM/SW Contact:    Anselm Pancoast, RN Phone Number: 11/26/2021, 12:37 PM  Clinical Narrative:                 Spoke with [place] and confirmed patient has been receiving assistance with rent for several months through the cancer center however this resource has stopped and patient has received eviction notice. Patient lives with caregiver and has no other family support. Caregiver was also given resources and has failed to follow up. [place] has made APS report for potential homelessness. TOC will follow up on possible LTC placement. Patient has refused Hospice and Palliative in the past.         Patient Goals and CMS Choice        Expected Discharge Plan and Services                                                Prior Living Arrangements/Services                       Activities of Daily Living Home Assistive Devices/Equipment: Wheelchair, Environmental consultant (specify type), Bedside commode/3-in-1, Shower chair with back, Grab bars in shower, Grab bars around toilet ADL Screening (condition at time of admission) Patient's cognitive ability adequate to safely complete daily activities?: No Is the patient deaf or have difficulty hearing?: No Does the patient have difficulty seeing, even when wearing glasses/contacts?: No Does the patient have difficulty concentrating, remembering, or making decisions?: No Patient able to express need for assistance with ADLs?: Yes Does the patient have difficulty dressing or bathing?: No Independently performs ADLs?: Yes (appropriate for developmental age) Does the patient have difficulty walking or climbing stairs?: No Weakness of Legs: Both Weakness of Arms/Hands: Both  Permission Sought/Granted                  Emotional Assessment               Admission diagnosis:  Atrial fibrillation with RVR ([place]) [I48.91] Sepsis ([place]) [A41.9] Sepsis without acute organ dysfunction, due to unspecified organism [facility]) [A41.9] Patient Active Problem List   Diagnosis Date Noted   Fall 11/25/2021   Palliative care encounter    Pressure injury of skin 08/01/2021   Generalized weakness    Acute respiratory failure with hypoxemia ([place])    Pneumonitis 07/30/2021   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   Encounter for antineoplastic immunotherapy 04/23/2021   Moderate protein-calorie malnutrition ([place]) 04/14/2021   Dehydration 10/14/2020   SVT (supraventricular tachycardia) ([place])    Hypomagnesemia    Dysphagia    Sepsis ([place]) 09/06/2020   Drug-induced constipation 08/26/2020   Hypotension 08/12/2020   Encounter for antineoplastic chemotherapy 08/08/2020   Atrial fibrillation with RVR ([place]) 08/08/2020   Elevated troponin 08/08/2020   Leukocytosis 08/08/2020   Hypokalemia 08/08/2020   SIRS (systemic inflammatory response syndrome) ([place]) 08/08/2020   Goals of care, counseling/discussion 08/06/2020   Head and neck cancer ([place]) 07/29/2020   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   Weight loss 07/29/2020   Neoplasm related pain 07/29/2020   Abscess of upper gum 09/12/2019   History of atrial fibrillation 03/22/2019   Bipolar affective disorder, current  episode mixed ([place]) 06/29/2018   Cerebral edema ([place]) 06/25/2018   Hypertensive emergency 06/16/2018   Subarachnoid bleed ([place]) 06/16/2018   TIA (transient ischemic attack) 06/15/2018   Tobacco abuse 06/02/2018   COPD (chronic obstructive pulmonary disease) ([place]) 04/21/2018   URI (upper respiratory infection) 03/31/2018   Hypertension 03/31/2018   Tachycardia 02/02/2018   Rash of body 02/02/2018   Hyponatremia 02/02/2018   PCP:  Pcp, No Pharmacy:   [place], [place] - [address] [place] [address] Phone: [phone] Fax:  [phone]     Social Determinants of Health ([place]) Interventions    Readmission Risk Interventions Readmission Risk Prevention Plan 08/01/2021 09/08/2020  Transportation Screening Complete Complete  PCP or Specialist Appt within 3-5 Days Complete Complete  HRI or Home Care Consult Complete Complete  Social Work Consult for [place] Planning/Counseling Complete Complete  Palliative Care Screening Complete Not Applicable  Medication Review Press photographer) Complete Complete  Some recent data might be hidden

## 2021-11-26 NOTE — ED Notes (Signed)
pt is currently reading SR on cardiac monitor, but is having short runs of afib around 150 bpm, only lasts for a few seconds and then converts back to SR with HR of 70-80's. Provider Neomia Glass notified at this time

## 2021-11-26 NOTE — Evaluation (Signed)
Physical Therapy Evaluation Patient Details Name: Shannon Schroeder MRN: [id number] DOB: 27-Jan-1956 Today's Date: 11/26/2021  History of Present Illness  66 y.o. F who presents to ER for evaluation of generalized weakness after falling on 1/23. Pt. reports hitting her head. PMH: Dysphagia, Dysarthria, History of Cancer, COPD, HTN, Depression, TIA, A. Fib, seizures.   Clinical Impression  Pt A&Ox4 upon PT entrance into room for evaluation this afternoon. Pt denies being in any pain currently. When asked about PLOF, she states she lives in a 1-story home w/ 4 steps to enter and bilateral railing. She reports she has a roommate/caregiver who assists w/ ADLs. Her bathroom set up is a tub-shower w/ a tub chair, and she states she uses her [facility] for toileting needs.   Pt was able to perform bed mobility w/ minA needing utilization of bed rails due to increased weakness. Once seated at EOB, Pt reported feeling dizzy. Pt transferred back to supine and BP was taken (123/94). She states it is not uncommon for her to get dizzy when sitting up, and that it usually lasts for 30-40 minutes. As a result, Pt performed general LE exercises in supine (see "Exercise" section) w/o complaints of increase pain or dizziness. She did not feeling increased fatigue w/ exercises. Pt will benefit from continued skilled PT in order to improve LE strength, mobility, gait, and restore PLOF. Current discharge recommendation to SNF is appropriate due to the level of assistance required by the patient to ensure safety and improve overall function.      Recommendations for follow up therapy are one component of a multi-disciplinary discharge planning process, led by the attending physician.  Recommendations may be updated based on patient status, additional functional criteria and insurance authorization.  Follow Up Recommendations Skilled nursing-short term rehab (<3 hours/day)    Assistance Recommended at Discharge Frequent or  constant Supervision/Assistance  Patient can return home with the following  A lot of help with walking and/or transfers;A lot of help with bathing/dressing/bathroom;Direct supervision/assist for medications management;Assistance with feeding;Assistance with cooking/housework;Help with stairs or ramp for entrance    Equipment Recommendations None recommended by PT  Recommendations for Other Services       Functional Status Assessment Patient has had a recent decline in their functional status and demonstrates the ability to make significant improvements in function in a reasonable and predictable amount of time.     Precautions / Restrictions Precautions Precautions: Fall Restrictions Weight Bearing Restrictions: No      Mobility  Bed Mobility Overal bed mobility: Needs Assistance Bed Mobility: Supine to Sit     Supine to sit: Min guard, Min assist     General bed mobility comments: utilizes bed railing due to weakness    Transfers                        Ambulation/Gait                  Stairs            Wheelchair Mobility    Modified Rankin (Stroke Patients Only)       Balance Overall balance assessment: Needs assistance Sitting-balance support: Bilateral upper extremity supported, Feet supported Sitting balance-Leahy Scale: Poor                                       Pertinent Vitals/Pain Pain Assessment  Pain Assessment: No/denies pain    Home Living Family/patient expects to be discharged to:: Private residence Living Arrangements: Non-relatives/Friends Available Help at Discharge: Personal care attendant;Family Type of Home: House Home Access: Stairs to enter Entrance Stairs-Rails: Right;Left;Can reach both Entrance Stairs-Number of Steps: 4   Home Layout: One level Home Equipment: Conservation officer, nature (2 wheels);BSC/3in1;Shower seat;Wheelchair - manual      Prior Function Prior Level of Function : Needs  assist             Mobility Comments: Pt reports roommate/caregiver helps w/ mobility/transportation ADLs Comments: Pt reports roommate/caregiver helps with ADLs     Hand Dominance        Extremity/Trunk Assessment   Upper Extremity Assessment Upper Extremity Assessment: Generalized weakness;Overall [facility] for tasks assessed    Lower Extremity Assessment Lower Extremity Assessment: Generalized weakness;Overall [facility] for tasks assessed       Communication   Communication: Other (comment) (dysarthria, dysphagia)  Cognition Arousal/Alertness: Awake/alert Behavior During Therapy: WFL for tasks assessed/performed Overall Cognitive Status: Within Functional Limits for tasks assessed                                 General Comments: A&Ox4        General Comments      Exercises General Exercises - Lower Extremity Heel Slides: AROM, Both, 10 reps Hip ABduction/ADduction: AROM, Both, 10 reps Straight Leg Raises: AROM, Both, 10 reps   Assessment/Plan    PT Assessment Patient needs continued PT services  PT Problem List Decreased strength;Decreased range of motion;Decreased activity tolerance;Decreased balance;Decreased mobility;Decreased coordination       PT Treatment Interventions Gait training;Stair training;Functional mobility training;Therapeutic activities;Therapeutic exercise;Balance training;Neuromuscular re-education;Patient/family education    PT Goals (Current goals can be found in the Care Plan section)  Acute Rehab PT Goals Patient Stated Goal: return home w/ increased strength to reduce risk of falls PT Goal Formulation: With patient Time For Goal Achievement: 12/10/21 Potential to Achieve Goals: Fair    Frequency Min 2X/week     Co-evaluation               AM-PAC PT "6 Clicks" Mobility  Outcome Measure Help needed turning from your back to your side while in a flat bed without using bedrails?: A Little Help needed moving from  lying on your back to sitting on the side of a flat bed without using bedrails?: A Little Help needed moving to and from a bed to a chair (including a wheelchair)?: A Lot Help needed standing up from a chair using your arms (e.g., wheelchair or bedside chair)?: A Lot Help needed to walk in hospital room?: A Lot Help needed climbing 3-5 steps with a railing? : A Lot 6 Click Score: 14    End of Session Equipment Utilized During Treatment: Gait belt Activity Tolerance: Patient tolerated treatment well;Patient limited by lethargy;Patient limited by fatigue (Pt reported feeling nausea; not uncommon but takes about 30-40 minutes to subside; BP: 123/94, HR 91) Patient left: in bed;with call bell/phone within reach;with bed alarm set Nurse Communication: Mobility status PT Visit Diagnosis: Unsteadiness on feet (R26.81);History of falling (Z91.81);Muscle weakness (generalized) (M62.81);Other abnormalities of gait and mobility (R26.89)    Time: 2130-8657 PT Time Calculation (min) (ACUTE ONLY): 31 min   Charges:             Jonnie Kind, SPT 11/26/2021, 2:32 PM

## 2021-11-26 NOTE — Progress Notes (Signed)
[facility] [place] NAME: Shannon Schroeder    MR#:  [id number]  DATE OF BIRTH:  1956/06/02  SUBJECTIVE:  patient feels a whole lot better. She denies any swallowing issues to me. Lives with her roommate. 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 She is currently not on any treatment due to her poor functional status.   REVIEW OF SYSTEMS:   Review of Systems  Constitutional:  Negative for chills, fever and weight loss.  HENT:  Negative for ear discharge, ear pain and nosebleeds.   Eyes:  Negative for blurred vision, pain and discharge.  Respiratory:  Negative for sputum production, shortness of breath, wheezing and stridor.   Cardiovascular:  Negative for chest pain, palpitations, orthopnea and PND.  Gastrointestinal:  Negative for abdominal pain, diarrhea, nausea and vomiting.  Genitourinary:  Negative for frequency and urgency.  Musculoskeletal:  Positive for falls. Negative for back pain and joint pain.  Neurological:  Positive for weakness. Negative for sensory change, speech change and focal weakness.  Psychiatric/Behavioral:  Negative for depression and hallucinations. The patient is not nervous/anxious.   Tolerating Diet: Tolerating PT:   DRUG ALLERGIES:   Allergies  Allergen Reactions   Naproxen Swelling, Rash and Other (See Comments)   Aspirin Swelling and Other (See Comments)   Belladonna Alkaloids Rash   Fluoxetine Hives and Rash   Fluoxetine Hcl Other (See Comments) and Rash   Paroxetine Hcl Rash and Other (See Comments)   Phenobarbital Rash and Other (See Comments)   Prozac [Fluoxetine Hcl] Rash    VITALS:  Blood pressure 116/83, pulse 78, temperature 98.1 F (36.7 C), temperature source Oral, resp. rate 15, height 5\' 6"  (1.676 m), weight 47.9 kg, last menstrual period 11/19/1988, SpO2 100 %.  PHYSICAL EXAMINATION:   Physical Exam  GENERAL:  66 y.o.-year-old patient lying in the bed with no  acute distress.  HEENT: Head atraumatic, normocephalic. Right-sided facial deformity due to her maxillary cancer LUNGS: Normal breath sounds bilaterally, no wheezing, rales, rhonchi.  CARDIOVASCULAR: S1, S2 normal. No murmurs, rubs, or gallops. Tachycardia ABDOMEN: Soft, nontender, nondistended. Bowel sounds present.  EXTREMITIES: No  edema b/l.    NEUROLOGIC: nonfocal PSYCHIATRIC:  patient is alert and awake SKIN: No obvious rash, lesion, or ulcer.   LABORATORY PANEL:  CBC Recent Labs  Lab 11/25/21 1222  WBC 18.8*  HGB 9.9*  HCT 30.2*  PLT 680*    Chemistries  Recent Labs  Lab 11/25/21 1222 11/26/21 1150  NA 134*  --   K 3.3*  --   CL 94*  --   CO2 29  --   GLUCOSE 123*  --   BUN 15  --   CREATININE 0.39*  --   CALCIUM 8.5*  --   MG  --  1.7  AST 20  --   ALT 9  --   ALKPHOS 173*  --   BILITOT 0.8  --    Cardiac Enzymes No results for input(s): TROPONINI in the last 168 hours. RADIOLOGY:  CT HEAD WO CONTRAST (5MM)  Result Date: 11/25/2021 CLINICAL DATA:  Mental status change.  Unknown cause. EXAM: CT HEAD WITHOUT CONTRAST TECHNIQUE: Contiguous axial images were obtained from the base of the skull through the vertex without intravenous contrast. RADIATION DOSE REDUCTION: This exam was performed according to the departmental dose-optimization program which includes automated exposure control, adjustment of the mA and/or kV according to patient size and/or use of  iterative reconstruction technique. COMPARISON:  CT brain 06/15/2018, MRI brain 06/27/2021; CTA head 06/15/2018 MRI brain 07/17/2020 FINDINGS: Brain: There is mild cortical atrophy, within normal limits for patient age. The ventricles are normal in configuration. The basilar cisterns are patent. No mass, mass effect, or midline shift. No acute intracranial hemorrhage is seen. No abnormal extra-axial fluid collection. Preservation of the normal cortical gray-white interface without CT evidence of an acute major  vascular territorial cortical based infarction. Vascular: No hyperdense vessel or unexpected calcification. Skull: Normal. Negative for fracture or focal lesion. Sinuses/Orbits: The visualized orbits are unremarkable. The visualized paranasal sinuses and mastoid air cells are clear. 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 There are surgical clips in this region. Other: None. IMPRESSION: 1. Postsurgical changes of right frontal craniotomy for prior distal right M1 aneurysm intervention. 2. No acute intracranial process. Electronically Signed   By: Yvonne Kendall M.D.   On: 11/25/2021 14:14   CT CHEST WO CONTRAST  Result Date: 11/25/2021 CLINICAL DATA:  Pneumonia. EXAM: CT CHEST WITHOUT CONTRAST TECHNIQUE: Multidetector CT imaging of the chest was performed following the standard protocol without IV contrast. RADIATION DOSE REDUCTION: This exam was performed according to the departmental dose-optimization program which includes automated exposure control, adjustment of the mA and/or kV according to patient size and/or use of iterative reconstruction technique. COMPARISON:  Radiograph of same day.  CT scan of September 11, 2021. FINDINGS: Cardiovascular: Atherosclerosis of thoracic aorta is noted without aneurysm formation. Normal cardiac size. No pericardial effusion. Mediastinum/Nodes: No enlarged mediastinal or axillary lymph nodes. Thyroid gland, trachea, and esophagus demonstrate no significant findings. Lungs/Pleura: No pneumothorax or pleural effusion is noted. Stable reticular densities are noted in the right upper lobe most consistent with scarring or chronic interstitial lung disease. Stable probable scarring is noted in right middle lobe and lingular segment of left upper lobe. However, increased left posterior basilar opacity is noted concerning for atelectasis or possibly pneumonia.  Multiple nodular opacities are noted posteriorly in the right lower lobe concerning for atypical or mycobacterial infection. Upper Abdomen: Hepatic steatosis. Musculoskeletal: No chest wall mass or suspicious bone lesions identified. IMPRESSION: Increased left posterior basilar opacity is noted concerning for atelectasis or possibly pneumonia. There is also the interval development of multiple irregular nodular opacities posteriorly in the right lung base concerning for atypical or mycobacterial infection. Stable bilateral upper lobe opacities are noted most consistent with scarring or chronic interstitial lung disease. Hepatic steatosis. Aortic Atherosclerosis (ICD10-I70.0). Electronically Signed   By: Marijo Conception M.D.   On: 11/25/2021 16:39   CT CERVICAL SPINE WO CONTRAST  Result Date: 11/25/2021 CLINICAL DATA:  Mental status change, unknown cause. EXAM: CT CERVICAL SPINE WITHOUT CONTRAST TECHNIQUE: Multidetector CT imaging of the cervical spine was performed without intravenous contrast. Multiplanar CT image reconstructions were also generated. RADIATION DOSE REDUCTION: This exam was performed according to the departmental dose-optimization program which includes automated exposure control, adjustment of the mA and/or kV according to patient size and/or use of iterative reconstruction technique. COMPARISON:  None. FINDINGS: Alignment: There is 3 mm grade 1 anterolisthesis of C2 on C3, 3 mm retrolisthesis of C3 on C4, 2 mm retrolisthesis of C4 on C5, 2 mm retrolisthesis of C5 on C6, and 2 mm grade 1 anterolisthesis of C7 on T1. The facet joints are appropriately aligned. Skull base and vertebrae: The atlantodens interval is intact. Vertebral body  heights are maintained. Moderate C3-4, moderate to severe left-greater-than-right C4-5, moderate C5-6, severe C6-7, and mild C7-T1 disc space narrowing. No acute fracture is seen. Soft tissues and spinal canal: No central canal hyperdensity is seen to indicate CT  evidence of an acute epidural hematoma. Disc levels: Multilevel degenerative changes including disc space narrowing, uncovertebral hypertrophy, and facet hypertrophy contribute to moderate to severe right C2-3, severe right and moderate left C3-4, moderate left C4-5, moderate left greater than right C5-6, and mild-to-moderate right and mild left C6-7 neural foraminal stenosis. Mild left C5-6 lateral recess narrowing. Mild C4-5 and minimal C3-4 central canal stenosis. Upper chest: There is ground-glass and curvilinear scarring within the left-greater-than-right lung apices not significantly changed compared to CTA chest 09/11/2021. Other: Partial visualization of left internal jugular catheter related to the patient's left chest wall port a catheter is seen on today's chest radiograph. No cervical chain lymphadenopathy is seen. IMPRESSION:: IMPRESSION: 1. Multilevel spondylolisthesis at C2-3 through C5-6 and C7 on T1. 2. Multilevel degenerative disc and joint changes as above with multilevel neural foraminal stenosis. 3. No acute fracture. Electronically Signed   By: Yvonne Kendall M.D.   On: 11/25/2021 14:21   DG Chest Portable 1 View  Result Date: 11/25/2021 CLINICAL DATA:  Altered mental status after falling yesterday and striking head. EXAM: PORTABLE CHEST 1 VIEW COMPARISON:  Radiographs 09/11/2021 and 08/07/2021.  CT 09/11/2021. FINDINGS: 1333 hours. Left IJ Port-A-Cath tip is unchanged at the level of the mid SVC. The heart size and mediastinal contours are stable. Stable chronic lung disease with emphysema and scattered ground-glass opacities. No superimposed edema, confluent airspace opacity, pleural effusion or pneumothorax. The bones appear unchanged. IMPRESSION: Stable radiographic appearance of the chest with chronic interstitial lung disease. No evidence of acute cardiopulmonary process. Electronically Signed   By: Richardean Sale M.D.   On: 11/25/2021 13:47   CT MAXILLOFACIAL WO CONTRAST  Result  Date: 11/25/2021 CLINICAL DATA:  Mental status change of unknown cause. History of maxillary sinus cancer. EXAM: CT MAXILLOFACIAL WITHOUT CONTRAST TECHNIQUE: Multidetector CT imaging of the maxillofacial structures was performed. Multiplanar CT image reconstructions were also generated. RADIATION DOSE REDUCTION: This exam was performed according to the departmental dose-optimization program which includes automated exposure control, adjustment of the mA and/or kV according to patient size and/or use of iterative reconstruction technique. COMPARISON:  Maxillofacial CT 12/03/2020.  PET scan 07/08/2021. FINDINGS: Osseous: Previous extensive mid face resection on the right for treatment of a maxillary sinus malignancy. Since the prior studies, there has been progressive lytic destruction of the skull base in the region of the carotid canal and foramen ovale, with tumor extension into the right division of the sphenoid sinus, medial aspect of the right middle cranial fossa and probably cavernous sinus region. Orbits: Orbits remain negative Sinuses: Tumor extension into the right division of the sphenoid sinus as noted above. Chronic extirpation of the right maxillary sinus and maxilla. Soft tissues: No other area of definable soft tissue tumor progression. Limited intracranial: As above. Additional note of pterional craniotomy and right MCA aneurysm clip. IMPRESSION: Tumor progression with destruction of the skull base on the right in the region of the foramen ovale and carotid canal with tumor extension into the right division of the sphenoid sinus and medial aspect of the middle cranial fossa. These abnormalities could be better evaluated with MRI. Electronically Signed   By: Nelson Chimes M.D.   On: 11/25/2021 14:46   ASSESSMENT AND PLAN:   Shannon Schroeder is  a 66 y.o. female with medical history significant for maxillary cancer previously on immunotherapy/chemotherapy which is currently on hold due to patient's  declining performance status, history of COPD, nicotine dependence, bipolar disorder, TIA who presents to the emergency room for evaluation of weakness and a fall. Patient states that she was trying to get out of bed when she fell yesterday and was unable to get up.  She was able to get up with assistance of her friend and roommate.   Sepsis from urinary source --As evidenced by fever with a T-max of 102 (at home), tachycardia, leukocytosis, lactic acidosis and pyuria. -- Patient hemodynamically stable. -- Denies any symptoms of dysuria -- will give Rocephin for three days -- blood culture negative -- urine culture not sent by ER  History of paroxysmal A. Fib --Continue metoprolol for rate control --Continue apixaban as primary prophylaxis for an acute stroke   maxillary sinus cancer --Continue fentanyl patch and oxycodone --Patient is no longer receiving chemotherapy/immunotherapy due to declining functional status -- palliative care consult appreciated. Patient wants to remain full code. She is not ready for hospice yet. Seen by Dr. Tasia Catchings from cancer center. Not a candidate for any further treatment at present. Patient does not want to pursue any MRI or further workup.    Hypokalemia Supplement potassium   Moderate protein calorie malnutrition (BMI 17.04) Secondary to dysphagia related to head and neck cancer Continue Megace Continue Ensure with meals    Nicotine dependence Smoking cessation has been discussed with patient Will place patient on a nicotine transdermal patch 7 mg daily    Fall Most likely secondary to weakness from chronic illness with superimposed sepsis seen by PT-- recommends rehab. Per my discussion with patient she wants to return back to staying with her roommate.  Procedures: Family communication : none Consults : palliative oncology CODE STATUS: full DVT Prophylaxis : eliquis Level of care: Telemetry Medical Status is: Inpatient  Remains inpatient  appropriate because: discharge planning      TOTAL TIME TAKING CARE OF THIS PATIENT: 25 minutes.  >50% time spent on counselling and coordination of care  Note: This dictation was prepared with Dragon dictation along with smaller phrase technology. Any transcriptional errors that result from this process are unintentional.  Fritzi Mandes M.D    [facility]   CC: Primary care physician; Pcp, No Patient ID: Shannon Schroeder, female   DOB: 08-13-1956, 66 y.o.   MRN: [id number]

## 2021-11-26 NOTE — TOC Progression Note (Signed)
Transition of Care [facility]) - Progression Note    Patient Details  Name: Shannon Schroeder MRN: [id number] Date of Birth: [birth year]  Transition of Care [facility]) CM/SW [place], RN Phone Number: 11/26/2021, 4:36 PM  Clinical Narrative:   Patient lives at home with roommate.  She states that she has been behind on rent but her landlord told her and roommate that he is working with them at this time.  She is aware of APS report.    She states that she has been missing some of her appointments because she does not see the point.  She states she has discussed this with her doctors, who understand.  Patient denies any needs from [facility] at this Time.  TOC contact information provided.  TOC to follow to discharge.         Expected Discharge Plan and Services                                                 Social Determinants of Health (SDOH) Interventions    Readmission Risk Interventions Readmission Risk Prevention Plan 08/01/2021 09/08/2020  Transportation Screening Complete Complete  PCP or Specialist Appt within 3-5 Days Complete Complete  HRI or [place] Complete Complete  Social Work Consult for [place] Planning/Counseling Complete Complete  Palliative Care Screening Complete Not Applicable  Medication Review [employer] photographer) Complete Complete  Some recent data might be hidden

## 2021-11-26 NOTE — ED Notes (Signed)
Meds given.  No chest pain or sob.  Pt alert.

## 2021-11-26 NOTE — Consult Note (Signed)
[place] at [facility] [phone] [phone]   Name: Shannon Schroeder Date: 11/26/2021 MRN: [id number]  DOB: 04-Apr-1956  Patient Care Team: Pcp, No as PCP - General Rockey Situ Kathlene November, MD as PCP - Cardiology (Cardiology) Earlie Server, MD as Consulting Physician (Oncology)    REASON FOR CONSULTATION: Shannon Schroeder is a 66 y.o. 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  Patient was hospitalized 07/30/2021-08/08/2021 with multifocal pneumonia versus possible drug-induced pneumonitis.  Patient has recently opted not to pursue further imaging for cancer.  Patient declined recommendation for hospice.  She was admitted to the hospital on 11/25/2021 after a fall.  She was found to have UTI.  Palliative care was consulted.  SOCIAL HISTORY:     reports that she has been smoking cigarettes. She has a 12.50 pack-year smoking history. She has never used smokeless tobacco. She reports that she does not drink alcohol and does not use drugs.  Patient is unmarried.  She has children from whom she is estranged.  She lives with a roommate and feels like she has good social support.  She worked at [employer] in [employer].  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    Anxiety    Arthritis    Bipolar disorder (HCC)    COPD (chronic obstructive pulmonary disease) (HCC)    NO INHALERS   Depression    Dysrhythmia    H/O V TACH   Head and neck cancer ([place]) 07/29/2020   Headache    H/O MIGRAINES   Hypertension    Seizures ([place])    X1 AFTER FALL    PAST SURGICAL HISTORY:  Past Surgical History:  Procedure Laterality Date   ABDOMINAL HYSTERECTOMY     partial    BREAST SURGERY     biopsy   CARDIAC CATHETERIZATION     X 2   CARPAL TUNNEL RELEASE Right    CARPECTOMY HAND Right    CATARACT EXTRACTION W/PHACO Left 03/08/2018   Procedure: CATARACT EXTRACTION PHACO AND INTRAOCULAR LENS PLACEMENT ([place]);  Surgeon: Birder Robson, MD;  Location: [facility] ORS;  Service: Ophthalmology;  Laterality: Left;  [place] 00:55 AP% 13.5 CDE 7.52 Fluid pack lot # [id number]   CATARACT EXTRACTION W/PHACO Right 04/05/2018   Procedure: CATARACT EXTRACTION PHACO AND INTRAOCULAR LENS PLACEMENT (I[facility]);  Surgeon: Birder Robson, MD;  Location: [facility] ORS;  Service: Ophthalmology;  Laterality: Right;  [place] 00:36 AP% 15.9 CDE 5.72 Fluid pack lot # [id number]   CEREBRAL ANEURYSM REPAIR     CHOLECYSTECTOMY     COLONOSCOPY WITH PROPOFOL N/A 04/26/2019   Procedure: COLONOSCOPY WITH PROPOFOL;  Surgeon: Jonathon Bellows, MD;  Location: [facility] ENDOSCOPY;  Service: Gastroenterology;  Laterality: N/A;   PORTACATH PLACEMENT Left 08/05/2020   Procedure: INSERTION PORT-A-CATH;  Surgeon: Herbert Pun, MD;  Location: [facility] ORS;  Service: General;  Laterality: Left;   TUBAL LIGATION      HEMATOLOGY/ONCOLOGY HISTORY:  Oncology History  Head and neck cancer ([place])  07/29/2020 Initial Diagnosis   Head and neck cancer ([place])   08/08/2020 - 08/08/2020 Chemotherapy   The patient had dexamethasone (DECADRON) 4 MG tablet, 8 mg, Oral, Daily, 0 of 1 cycle, Start date: --, End date: -- palonosetron (ALOXI) injection  0.25 mg, 0.25 mg, Intravenous,  Once, 0 of 3 cycles CISplatin (PLATINOL) 166 mg in sodium chloride 0.9 % 500 mL chemo infusion, 100 mg/m2, Intravenous,  Once, 0 of 3 cycles fosaprepitant (EMEND) 150 mg in sodium chloride 0.9 % 145 mL IVPB, 150 mg, Intravenous,  Once, 0 of 3 cycles   for chemotherapy treatment.     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  MEDICATIONS:  Current Facility-Administered Medications  Medication Dose Route Frequency Provider Last Rate Last Admin   acetaminophen (TYLENOL) tablet 650 mg  650 mg Oral Q6H PRN Agbata, Tochukwu, MD       Or   acetaminophen (TYLENOL) suppository 650 mg  650 mg Rectal Q6H PRN Agbata, Tochukwu, MD       albuterol (PROVENTIL) (2.5 MG/3ML) 0.083% nebulizer solution 2.5 mg  2.5 mg Nebulization Q6H PRN Agbata, Tochukwu, MD       apixaban (ELIQUIS) tablet 5 mg  5 mg Oral BID Agbata, Tochukwu, MD   5 mg at 11/26/21 0740   cefTRIAXone (ROCEPHIN) 2 g in sodium chloride 0.9 % 100 mL IVPB  2 g Intravenous Q24H Fritzi Mandes, MD       feeding supplement (ENSURE ENLIVE / ENSURE PLUS) liquid 237 mL  237 mL Oral TID BM Agbata, Tochukwu, MD   237 mL at 11/26/21 0941   fentaNYL (DURAGESIC) 50 MCG/HR 2 patch  2 patch Transdermal Q72H Agbata, Tochukwu, MD       gabapentin (NEURONTIN) capsule 600 mg  600 mg Oral TID Agbata, Tochukwu, MD   600 mg at 11/26/21 0741   [START ON 11/27/2021] influenza vaccine adjuvanted (FLUAD) injection 0.5 mL  0.5 mL Intramuscular Tomorrow-1000 Agbata, Tochukwu, MD       megestrol (MEGACE) 400 MG/10ML suspension 625 mg  625 mg Oral Daily Agbata, Tochukwu, MD       melatonin tablet 10 mg  10 mg Oral QHS PRN Agbata, Tochukwu, MD       metoprolol tartrate (LOPRESSOR) tablet 12.5 mg  12.5 mg Oral BID Agbata, Tochukwu, MD   12.5 mg at 11/26/21 0740   mometasone-formoterol (DULERA) 100-5 MCG/ACT inhaler 2 puff  2 puff Inhalation BID Agbata, Tochukwu, MD       multivitamin with minerals tablet    Oral Daily Agbata, Tochukwu, MD       nicotine (NICODERM CQ - dosed in mg/24 hr) patch 7 mg  7 mg Transdermal Daily Agbata, Tochukwu, MD       ondansetron (ZOFRAN) tablet 4 mg  4 mg Oral Q6H PRN Agbata, Tochukwu, MD       Or   ondansetron (ZOFRAN) injection 4 mg  4 mg Intravenous Q6H PRN Agbata, Tochukwu, MD       Oxcarbazepine (TRILEPTAL) tablet 600 mg  600 mg Oral BID Agbata, Tochukwu, MD   600 mg at 11/26/21 0741   oxyCODONE (Oxy IR/ROXICODONE) immediate release tablet 30 mg  30 mg Oral Q4H  PRN Collier Bullock, MD   30 mg at 11/26/21 0943   polyethylene glycol (MIRALAX / GLYCOLAX) packet 17 g  17 g Oral Daily PRN Agbata, Tochukwu, MD       potassium chloride (KLOR-CON) packet 20 mEq  20 mEq Oral Once Fritzi Mandes, MD       traZODone (DESYREL) tablet 50-100 mg  50-100 mg Oral QHS PRN Agbata, Tochukwu, MD       Facility-Administered Medications Ordered in Other Encounters  Medication Dose Route Frequency Provider Last Rate Last Admin   heparin lock flush 100 UNIT/ML injection            heparin lock flush 100 UNIT/ML injection            heparin lock flush 100 unit/mL  500 Units Intravenous Once Earlie Server, MD       heparin lock flush 100 unit/mL  500 Units Intracatheter Once PRN Earlie Server, MD       sodium chloride flush (NS) 0.9 % injection 10 mL  10 mL Intravenous PRN Earlie Server, MD   10 mL at 10/10/20 1040   sodium chloride flush (NS) 0.9 % injection 10 mL  10 mL Intravenous PRN Earlie Server, MD   10 mL at 11/13/20 0836   sodium chloride flush (NS) 0.9 % injection 10 mL  10 mL Intravenous PRN Earlie Server, MD   10 mL at 12/30/20 8841   sodium chloride flush (NS) 0.9 % injection 10 mL  10 mL Intravenous PRN Earlie Server, MD   10 mL at 01/14/21 0921   sodium chloride flush (NS) 0.9 % injection 10 mL  10 mL Intravenous PRN Earlie Server, MD   10 mL at 06/11/21 0815   sodium chloride flush (NS) 0.9 % injection 10 mL  10 mL Intravenous PRN Earlie Server, MD   10 mL at 07/09/21 0814    VITAL SIGNS: BP (!) 114/51 (BP  Location: Left Arm)    Pulse (!) 55    Temp 98 F (36.7 C) (Oral)    Resp 16    Ht 5\' 6"  (1.676 m)    Wt 105 lb 9.6 oz (47.9 kg)    LMP 11/19/1988 (Exact Date)    SpO2 100%    BMI 17.04 kg/m  Filed Weights   11/25/21 1220  Weight: 105 lb 9.6 oz (47.9 kg)    Estimated body mass index is 17.04 kg/m as calculated from the following:   Height as of this encounter: 5\' 6"  (1.676 m).   Weight as of this encounter: 105 lb 9.6 oz (47.9 kg).  LABS: CBC:    Component Value Date/Time   WBC 18.8 (H) 11/25/2021 1222   HGB 9.9 (L) 11/25/2021 1222   HGB 13.8 03/15/2019 1201   HCT 30.2 (L) 11/25/2021 1222   HCT 39.0 03/15/2019 1201   HCT 36 12/18/2016 0000   PLT 680 (H) 11/25/2021 1222   PLT 325 03/15/2019 1201   MCV 96.5 11/25/2021 1222   MCV 96 03/15/2019 1201   MCV 98 12/18/2016 0000   NEUTROABS 9.8 (H) 08/02/2021 1141   NEUTROABS 2.4 05/03/2018 1826   LYMPHSABS 0.6 (L) 08/02/2021 1141   LYMPHSABS 1.6 05/03/2018 1826   MONOABS 0.7 08/02/2021 1141   EOSABS 0.0 08/02/2021 1141   EOSABS 0.2 05/03/2018 1826   BASOSABS 0.0 08/02/2021 1141   BASOSABS 0.1 05/03/2018 1826   BASOSABS 2 12/18/2016 0000   Comprehensive Metabolic Panel:    Component Value Date/Time   NA 134 (  L) 11/25/2021 1222   NA 135 03/15/2019 1201   K 3.3 (L) 11/25/2021 1222   K 4.2 06/26/2015 0000   CL 94 (L) 11/25/2021 1222   CL 91 05/25/2016 0000   CO2 29 11/25/2021 1222   CO2 25 08/19/2016 0000   BUN 15 11/25/2021 1222   BUN 12 03/15/2019 1201   BUN 11 12/18/2016 0000   CREATININE 0.39 (L) 11/25/2021 1222   CREATININE 0.67 12/18/2016 0000   GLUCOSE 123 (H) 11/25/2021 1222   CALCIUM 8.5 (L) 11/25/2021 1222   CALCIUM 8.9 12/18/2016 0000   AST 20 11/25/2021 1222   AST 15 12/18/2016 0000   ALT 9 11/25/2021 1222   ALT 11 08/19/2016 0000   ALKPHOS 173 (H) 11/25/2021 1222   ALKPHOS 117 12/18/2016 0000   BILITOT 0.8 11/25/2021 1222   BILITOT <0.2 03/15/2019 1201   BILITOT 0.3 12/18/2016 0000   PROT 6.2 (L)  11/25/2021 1222   PROT 6.8 03/15/2019 1201   ALBUMIN 2.2 (L) 11/25/2021 1222   ALBUMIN 4.5 03/15/2019 1201   ALBUMIN 4.5 12/18/2016 0000    RADIOGRAPHIC STUDIES: CT HEAD WO CONTRAST (5MM)  Result Date: 11/25/2021 CLINICAL DATA:  Mental status change.  Unknown cause. EXAM: CT HEAD WITHOUT CONTRAST TECHNIQUE: Contiguous axial images were obtained from the base of the skull through the vertex without intravenous contrast. RADIATION DOSE REDUCTION: This exam was performed according to the departmental dose-optimization program which includes automated exposure control, adjustment of the mA and/or kV according to patient size and/or use of iterative reconstruction technique. COMPARISON:  CT brain 06/15/2018, MRI brain 06/27/2021; CTA head 06/15/2018 MRI brain 07/17/2020 FINDINGS: Brain: There is mild cortical atrophy, within normal limits for patient age. The ventricles are normal in configuration. The basilar cisterns are patent. No mass, mass effect, or midline shift. No acute intracranial hemorrhage is seen. No abnormal extra-axial fluid collection. Preservation of the normal cortical gray-white interface without CT evidence of an acute major vascular territorial cortical based infarction. Vascular: No hyperdense vessel or unexpected calcification. Skull: Normal. Negative for fracture or focal lesion. Sinuses/Orbits: The visualized orbits are unremarkable. The visualized paranasal sinuses and mastoid air cells are clear. 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 There are surgical clips in this region. Other: None. IMPRESSION: 1. Postsurgical changes of right frontal craniotomy for prior distal right M1 aneurysm intervention. 2. No acute intracranial process. Electronically Signed   By: Yvonne Kendall M.D.   On: 11/25/2021 14:14   CT CHEST WO CONTRAST  Result Date: 11/25/2021 CLINICAL  DATA:  Pneumonia. EXAM: CT CHEST WITHOUT CONTRAST TECHNIQUE: Multidetector CT imaging of the chest was performed following the standard protocol without IV contrast. RADIATION DOSE REDUCTION: This exam was performed according to the departmental dose-optimization program which includes automated exposure control, adjustment of the mA and/or kV according to patient size and/or use of iterative reconstruction technique. COMPARISON:  Radiograph of same day.  CT scan of September 11, 2021. FINDINGS: Cardiovascular: Atherosclerosis of thoracic aorta is noted without aneurysm formation. Normal cardiac size. No pericardial effusion. Mediastinum/Nodes: No enlarged mediastinal or axillary lymph nodes. Thyroid gland, trachea, and esophagus demonstrate no significant findings. Lungs/Pleura: No pneumothorax or pleural effusion is noted. Stable reticular densities are noted in the right upper lobe most consistent with scarring or chronic interstitial lung disease. Stable probable scarring is noted in right middle lobe and lingular segment of left upper  lobe. However, increased left posterior basilar opacity is noted concerning for atelectasis or possibly pneumonia. Multiple nodular opacities are noted posteriorly in the right lower lobe concerning for atypical or mycobacterial infection. Upper Abdomen: Hepatic steatosis. Musculoskeletal: No chest wall mass or suspicious bone lesions identified. IMPRESSION: Increased left posterior basilar opacity is noted concerning for atelectasis or possibly pneumonia. There is also the interval development of multiple irregular nodular opacities posteriorly in the right lung base concerning for atypical or mycobacterial infection. Stable bilateral upper lobe opacities are noted most consistent with scarring or chronic interstitial lung disease. Hepatic steatosis. Aortic Atherosclerosis (ICD10-I70.0). Electronically Signed   By: Marijo Conception M.D.   On: 11/25/2021 16:39   CT CERVICAL SPINE  WO CONTRAST  Result Date: 11/25/2021 CLINICAL DATA:  Mental status change, unknown cause. EXAM: CT CERVICAL SPINE WITHOUT CONTRAST TECHNIQUE: Multidetector CT imaging of the cervical spine was performed without intravenous contrast. Multiplanar CT image reconstructions were also generated. RADIATION DOSE REDUCTION: This exam was performed according to the departmental dose-optimization program which includes automated exposure control, adjustment of the mA and/or kV according to patient size and/or use of iterative reconstruction technique. COMPARISON:  None. FINDINGS: Alignment: There is 3 mm grade 1 anterolisthesis of C2 on C3, 3 mm retrolisthesis of C3 on C4, 2 mm retrolisthesis of C4 on C5, 2 mm retrolisthesis of C5 on C6, and 2 mm grade 1 anterolisthesis of C7 on T1. The facet joints are appropriately aligned. Skull base and vertebrae: The atlantodens interval is intact. Vertebral body heights are maintained. Moderate C3-4, moderate to severe left-greater-than-right C4-5, moderate C5-6, severe C6-7, and mild C7-T1 disc space narrowing. No acute fracture is seen. Soft tissues and spinal canal: No central canal hyperdensity is seen to indicate CT evidence of an acute epidural hematoma. Disc levels: Multilevel degenerative changes including disc space narrowing, uncovertebral hypertrophy, and facet hypertrophy contribute to moderate to severe right C2-3, severe right and moderate left C3-4, moderate left C4-5, moderate left greater than right C5-6, and mild-to-moderate right and mild left C6-7 neural foraminal stenosis. Mild left C5-6 lateral recess narrowing. Mild C4-5 and minimal C3-4 central canal stenosis. Upper chest: There is ground-glass and curvilinear scarring within the left-greater-than-right lung apices not significantly changed compared to CTA chest 09/11/2021. Other: Partial visualization of left internal jugular catheter related to the patient's left chest wall port a catheter is seen on today's  chest radiograph. No cervical chain lymphadenopathy is seen. IMPRESSION:: IMPRESSION: 1. Multilevel spondylolisthesis at C2-3 through C5-6 and C7 on T1. 2. Multilevel degenerative disc and joint changes as above with multilevel neural foraminal stenosis. 3. No acute fracture. Electronically Signed   By: Yvonne Kendall M.D.   On: 11/25/2021 14:21   DG Chest Portable 1 View  Result Date: 11/25/2021 CLINICAL DATA:  Altered mental status after falling yesterday and striking head. EXAM: PORTABLE CHEST 1 VIEW COMPARISON:  Radiographs 09/11/2021 and 08/07/2021.  CT 09/11/2021. FINDINGS: 1333 hours. Left IJ Port-A-Cath tip is unchanged at the level of the mid SVC. The heart size and mediastinal contours are stable. Stable chronic lung disease with emphysema and scattered ground-glass opacities. No superimposed edema, confluent airspace opacity, pleural effusion or pneumothorax. The bones appear unchanged. IMPRESSION: Stable radiographic appearance of the chest with chronic interstitial lung disease. No evidence of acute cardiopulmonary process. Electronically Signed   By: Richardean Sale M.D.   On: 11/25/2021 13:47   CT MAXILLOFACIAL WO CONTRAST  Result Date: 11/25/2021 CLINICAL DATA:  Mental status change of unknown  cause. History of maxillary sinus cancer. EXAM: CT MAXILLOFACIAL WITHOUT CONTRAST TECHNIQUE: Multidetector CT imaging of the maxillofacial structures was performed. Multiplanar CT image reconstructions were also generated. RADIATION DOSE REDUCTION: This exam was performed according to the departmental dose-optimization program which includes automated exposure control, adjustment of the mA and/or kV according to patient size and/or use of iterative reconstruction technique. COMPARISON:  Maxillofacial CT 12/03/2020.  PET scan 07/08/2021. FINDINGS: Osseous: Previous extensive mid face resection on the right for treatment of a maxillary sinus malignancy. Since the prior studies, there has been progressive  lytic destruction of the skull base in the region of the carotid canal and foramen ovale, with tumor extension into the right division of the sphenoid sinus, medial aspect of the right middle cranial fossa and probably cavernous sinus region. Orbits: Orbits remain negative Sinuses: Tumor extension into the right division of the sphenoid sinus as noted above. Chronic extirpation of the right maxillary sinus and maxilla. Soft tissues: No other area of definable soft tissue tumor progression. Limited intracranial: As above. Additional note of pterional craniotomy and right MCA aneurysm clip. IMPRESSION: Tumor progression with destruction of the skull base on the right in the region of the foramen ovale and carotid canal with tumor extension into the right division of the sphenoid sinus and medial aspect of the middle cranial fossa. These abnormalities could be better evaluated with MRI. Electronically Signed   By: Nelson Chimes M.D.   On: 11/25/2021 14:46    PERFORMANCE STATUS (ECOG) : 3 - Symptomatic, >50% confined to bed  Review of Systems Unless otherwise noted, a complete review of systems is negative.  Physical Exam General: NAD Pulmonary: Unlabored Extremities: no edema, no joint deformities Skin: no rashes Neurological: Weakness, dysarthria  IMPRESSION: Patient well-known to me from the clinic.  She feels better after initiating treatment for UTI with likely plan for discharge home tomorrow.  I again addressed goals with patient.  She confirms that she is still not interested in pursuing repeat MRI and says "what good is going to do."  I again recommended hospice involvement the patient was noncommittal.  She is very reluctant to allow people in the home and says that she will need to speak with her roommate.  I again discussed CODE STATUS.  Patient says that she was "not ready for DNR yet."  I explained the probable futility associated with aggressive resuscitation at end-of-life, particularly  if patient's goals are aligned with not pursuing further cancer care.  She verbalized understanding.  PLAN: -Continue current scope of treatment -Will plan outpatient follow up virtually next week  Case discussed with Dr. Posey Pronto   Time Total: 30 minutes  Visit consisted of counseling and education dealing with the complex and emotionally intense issues of symptom management and palliative care in the setting of serious and potentially life-threatening illness.Greater than 50%  of this time was spent counseling and coordinating care related to the above assessment and plan.  Signed by: Altha Harm, PhD, NP-C

## 2021-11-26 NOTE — Progress Notes (Signed)
Patient arrived to unit via stretcher.  Requests bedpan on arrival and bedpan provided.  Telemetry verified, patient oriented to room and call bell and provided education about bed alarm and "call don't fall" procedure.  Patient voiced understanding.  Patient provided PRN pain medication for right sided facial pain.  Will continue to monitor.

## 2021-11-27 ENCOUNTER — Encounter: Payer: Self-pay | Admitting: Hospice and Palliative Medicine

## 2021-11-27 ENCOUNTER — Encounter: Payer: Self-pay | Admitting: Licensed Clinical Social Worker

## 2021-11-27 DIAGNOSIS — E440 Moderate protein-calorie malnutrition: Secondary | ICD-10-CM | POA: Diagnosis not present

## 2021-11-27 DIAGNOSIS — A419 Sepsis, unspecified organism: Secondary | ICD-10-CM | POA: Diagnosis not present

## 2021-11-27 DIAGNOSIS — C760 Malignant neoplasm of head, face and neck: Secondary | ICD-10-CM | POA: Diagnosis not present

## 2021-11-27 DIAGNOSIS — I4891 Unspecified atrial fibrillation: Secondary | ICD-10-CM | POA: Diagnosis not present

## 2021-11-27 LAB — BASIC METABOLIC PANEL
Anion gap: 4 — ABNORMAL LOW (ref 5–15)
BUN: 12 mg/dL (ref 8–23)
CO2: 30 mmol/L (ref 22–32)
Calcium: 7.2 mg/dL — ABNORMAL LOW (ref 8.9–10.3)
Chloride: 99 mmol/L (ref 98–111)
Creatinine, Ser: 0.41 mg/dL — ABNORMAL LOW (ref 0.44–1.00)
GFR, Estimated: >60 mL/min (ref >60)
Glucose, Bld: 70 mg/dL (ref 70–99)
Potassium: 2.9 mmol/L — ABNORMAL LOW (ref 3.5–5.1)
Sodium: 133 mmol/L — ABNORMAL LOW (ref 135–145)

## 2021-11-27 LAB — CBC
HCT: 21.1 % — ABNORMAL LOW (ref 36.0–46.0)
Hemoglobin: 7.1 g/dL — ABNORMAL LOW (ref 12.0–15.0)
MCH: 32.1 pg (ref 26.0–34.0)
MCHC: 33.6 g/dL (ref 30.0–36.0)
MCV: 95.5 fL (ref 80.0–100.0)
Platelets: 532 10*3/uL — ABNORMAL HIGH (ref 150–400)
RBC: 2.21 MIL/uL — ABNORMAL LOW (ref 3.87–5.11)
RDW: 18.7 % — ABNORMAL HIGH (ref 11.5–15.5)
WBC: 16.7 10*3/uL — ABNORMAL HIGH (ref 4.0–10.5)
nRBC: 0 % (ref 0.0–0.2)

## 2021-11-27 LAB — PHOSPHORUS: Phosphorus: 2.2 mg/dL — ABNORMAL LOW (ref 2.5–4.6)

## 2021-11-27 LAB — MAGNESIUM: Magnesium: 2.1 mg/dL (ref 1.7–2.4)

## 2021-11-27 MED ORDER — SODIUM CHLORIDE 0.9 % IV SOLN
2.0000 g | INTRAVENOUS | Status: DC
  Administered 2021-11-27: 2 g via INTRAVENOUS
  Filled 2021-11-27: qty 20

## 2021-11-27 MED ORDER — POTASSIUM CHLORIDE 10 MEQ/100ML IV SOLN
10.0000 meq | INTRAVENOUS | Status: AC
  Administered 2021-11-27 (×2): 10 meq via INTRAVENOUS
  Filled 2021-11-27 (×2): qty 100

## 2021-11-27 MED ORDER — METOPROLOL TARTRATE 25 MG PO TABS
12.5000 mg | ORAL_TABLET | Freq: Two times a day (BID) | ORAL | Status: DC
  Administered 2021-11-27 – 2021-11-28 (×2): 12.5 mg via ORAL
  Filled 2021-11-27 (×2): qty 1

## 2021-11-27 NOTE — Plan of Care (Signed)

## 2021-11-27 NOTE — Progress Notes (Signed)
PHARMACY CONSULT NOTE - FOLLOW UP  Pharmacy Consult for Electrolyte Monitoring and Replacement   Recent Labs: Potassium (mmol/L)  Date Value  11/27/2021 2.9 (L)   Potassium, Ser (no units)  Date Value  06/26/2015 4.2   Magnesium (mg/dL)  Date Value  11/27/2021 2.1   Calcium (mg/dL)  Date Value  11/27/2021 7.2 (L)   Calcium, Ser (no units)  Date Value  12/18/2016 8.9   Albumin (g/dL)  Date Value  11/25/2021 2.2 (L)  03/15/2019 4.5   Albumin Serum (no units)  Date Value  12/18/2016 4.5   Sodium (mmol/L)  Date Value  11/27/2021 133 (L)  03/15/2019 135     Assessment: Patient admitted with increased weakness. PMH includes  maxillary cancer, COPD, nicotine dependence, bipolar disorder, and TIA. Pharmacy consulted to manage electrolytes.  Goal of Therapy:  Electrolytes WNL  Plan:  Calcium and Mg remain within normal limits. Potassium remains below goal since yesterday. Noted patient refused oral KCL replacement order on 1/25 by MD. Will order Kcl 17meq q 1 hr x 2 doses and continue to follow level with MA labs.  Krystina Strieter Rodriguez-Guzman PharmD, BCPS 11/27/2021 11:00 AM

## 2021-11-27 NOTE — Progress Notes (Signed)
[place] [facility] [place] NAME: Shannon Schroeder    MR#:  [id number]  DATE OF BIRTH:  Dec 20, 1955  SUBJECTIVE:  patient feels a whole lot better. She denies any swallowing issues to me. Lives with her roommate. 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 She is currently not on any treatment due to her poor functional status.  Denies any rectal bleed, abdominal pain  REVIEW OF SYSTEMS:   Review of Systems  Constitutional:  Negative for chills, fever and weight loss.  HENT:  Negative for ear discharge, ear pain and nosebleeds.   Eyes:  Negative for blurred vision, pain and discharge.  Respiratory:  Negative for sputum production, shortness of breath, wheezing and stridor.   Cardiovascular:  Negative for chest pain, palpitations, orthopnea and PND.  Gastrointestinal:  Negative for abdominal pain, diarrhea, nausea and vomiting.  Genitourinary:  Negative for frequency and urgency.  Musculoskeletal:  Positive for falls. Negative for back pain and joint pain.  Neurological:  Positive for weakness. Negative for sensory change, speech change and focal weakness.  Psychiatric/Behavioral:  Negative for depression and hallucinations. The patient is not nervous/anxious.   Tolerating Diet:yes Tolerating PT:   DRUG ALLERGIES:   Allergies  Allergen Reactions   Naproxen Swelling, Rash and Other (See Comments)   Aspirin Swelling and Other (See Comments)   Belladonna Alkaloids Rash   Fluoxetine Hives and Rash   Fluoxetine Hcl Other (See Comments) and Rash   Paroxetine Hcl Rash and Other (See Comments)   Phenobarbital Rash and Other (See Comments)   Prozac [Fluoxetine Hcl] Rash    VITALS:  Blood pressure (!) 95/59, pulse 67, temperature 98.2 F (36.8 C), resp. rate 15, height 5\' 6"  (1.676 m), weight 47.9 kg, last menstrual period 11/19/1988, SpO2 98 %.  PHYSICAL EXAMINATION:   Physical Exam  GENERAL:  66 y.o.-year-old  patient lying in the bed with no acute distress.  HEENT: Head atraumatic, normocephalic. Right-sided facial deformity due to her maxillary cancer LUNGS: Normal breath sounds bilaterally, no wheezing, rales, rhonchi.  CARDIOVASCULAR: S1, S2 normal. No murmurs, rubs, or gallops.  ABDOMEN: Soft, nontender, nondistended. Bowel sounds present.  EXTREMITIES: No  edema b/l.    NEUROLOGIC: nonfocal PSYCHIATRIC:  patient is alert and awake SKIN: No obvious rash, lesion, or ulcer.   LABORATORY PANEL:  CBC Recent Labs  Lab 11/27/21 0436  WBC 16.7*  HGB 7.1*  HCT 21.1*  PLT 532*     Chemistries  Recent Labs  Lab 11/25/21 1222 11/26/21 1150 11/27/21 0436  NA 134*  --  133*  K 3.3*  --  2.9*  CL 94*  --  99  CO2 29  --  30  GLUCOSE 123*  --  70  BUN 15  --  12  CREATININE 0.39*  --  0.41*  CALCIUM 8.5*  --  7.2*  MG  --    < > 2.1  AST 20  --   --   ALT 9  --   --   ALKPHOS 173*  --   --   BILITOT 0.8  --   --    < > = values in this interval not displayed.    Cardiac Enzymes No results for input(s): TROPONINI in the last 168 hours. RADIOLOGY:  CT HEAD WO CONTRAST (5MM)  Result Date: 11/25/2021 CLINICAL DATA:  Mental status change.  Unknown cause. EXAM: CT HEAD WITHOUT CONTRAST TECHNIQUE: Contiguous axial images were obtained  from the base of the skull through the vertex without intravenous contrast. RADIATION DOSE REDUCTION: This exam was performed according to the departmental dose-optimization program which includes automated exposure control, adjustment of the mA and/or kV according to patient size and/or use of iterative reconstruction technique. COMPARISON:  CT brain 06/15/2018, MRI brain 06/27/2021; CTA head 06/15/2018 MRI brain 07/17/2020 FINDINGS: Brain: There is mild cortical atrophy, within normal limits for patient age. The ventricles are normal in configuration. The basilar cisterns are patent. No mass, mass effect, or midline shift. No acute intracranial hemorrhage is  seen. No abnormal extra-axial fluid collection. Preservation of the normal cortical gray-white interface without CT evidence of an acute major vascular territorial cortical based infarction. Vascular: No hyperdense vessel or unexpected calcification. Skull: Normal. Negative for fracture or focal lesion. Sinuses/Orbits: The visualized orbits are unremarkable. The visualized paranasal sinuses and mastoid air cells are clear. 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 There are surgical clips in this region. Other: None. IMPRESSION: 1. Postsurgical changes of right frontal craniotomy for prior distal right M1 aneurysm intervention. 2. No acute intracranial process. Electronically Signed   By: Yvonne Kendall M.D.   On: 11/25/2021 14:14   CT CHEST WO CONTRAST  Result Date: 11/25/2021 CLINICAL DATA:  Pneumonia. EXAM: CT CHEST WITHOUT CONTRAST TECHNIQUE: Multidetector CT imaging of the chest was performed following the standard protocol without IV contrast. RADIATION DOSE REDUCTION: This exam was performed according to the departmental dose-optimization program which includes automated exposure control, adjustment of the mA and/or kV according to patient size and/or use of iterative reconstruction technique. COMPARISON:  Radiograph of same day.  CT scan of September 11, 2021. FINDINGS: Cardiovascular: Atherosclerosis of thoracic aorta is noted without aneurysm formation. Normal cardiac size. No pericardial effusion. Mediastinum/Nodes: No enlarged mediastinal or axillary lymph nodes. Thyroid gland, trachea, and esophagus demonstrate no significant findings. Lungs/Pleura: No pneumothorax or pleural effusion is noted. Stable reticular densities are noted in the right upper lobe most consistent with scarring or chronic interstitial lung disease. Stable probable scarring is noted in right middle lobe and  lingular segment of left upper lobe. However, increased left posterior basilar opacity is noted concerning for atelectasis or possibly pneumonia. Multiple nodular opacities are noted posteriorly in the right lower lobe concerning for atypical or mycobacterial infection. Upper Abdomen: Hepatic steatosis. Musculoskeletal: No chest wall mass or suspicious bone lesions identified. IMPRESSION: Increased left posterior basilar opacity is noted concerning for atelectasis or possibly pneumonia. There is also the interval development of multiple irregular nodular opacities posteriorly in the right lung base concerning for atypical or mycobacterial infection. Stable bilateral upper lobe opacities are noted most consistent with scarring or chronic interstitial lung disease. Hepatic steatosis. Aortic Atherosclerosis (ICD10-I70.0). Electronically Signed   By: Marijo Conception M.D.   On: 11/25/2021 16:39   CT CERVICAL SPINE WO CONTRAST  Result Date: 11/25/2021 CLINICAL DATA:  Mental status change, unknown cause. EXAM: CT CERVICAL SPINE WITHOUT CONTRAST TECHNIQUE: Multidetector CT imaging of the cervical spine was performed without intravenous contrast. Multiplanar CT image reconstructions were also generated. RADIATION DOSE REDUCTION: This exam was performed according to the departmental dose-optimization program which includes automated exposure control, adjustment of the mA and/or kV according to patient size and/or use of iterative reconstruction technique. COMPARISON:  None. FINDINGS: Alignment: There is 3 mm grade 1 anterolisthesis of C2 on C3, 3 mm retrolisthesis of C3  on C4, 2 mm retrolisthesis of C4 on C5, 2 mm retrolisthesis of C5 on C6, and 2 mm grade 1 anterolisthesis of C7 on T1. The facet joints are appropriately aligned. Skull base and vertebrae: The atlantodens interval is intact. Vertebral body heights are maintained. Moderate C3-4, moderate to severe left-greater-than-right C4-5, moderate C5-6, severe C6-7,  and mild C7-T1 disc space narrowing. No acute fracture is seen. Soft tissues and spinal canal: No central canal hyperdensity is seen to indicate CT evidence of an acute epidural hematoma. Disc levels: Multilevel degenerative changes including disc space narrowing, uncovertebral hypertrophy, and facet hypertrophy contribute to moderate to severe right C2-3, severe right and moderate left C3-4, moderate left C4-5, moderate left greater than right C5-6, and mild-to-moderate right and mild left C6-7 neural foraminal stenosis. Mild left C5-6 lateral recess narrowing. Mild C4-5 and minimal C3-4 central canal stenosis. Upper chest: There is ground-glass and curvilinear scarring within the left-greater-than-right lung apices not significantly changed compared to CTA chest 09/11/2021. Other: Partial visualization of left internal jugular catheter related to the patient's left chest wall port a catheter is seen on today's chest radiograph. No cervical chain lymphadenopathy is seen. IMPRESSION:: IMPRESSION: 1. Multilevel spondylolisthesis at C2-3 through C5-6 and C7 on T1. 2. Multilevel degenerative disc and joint changes as above with multilevel neural foraminal stenosis. 3. No acute fracture. Electronically Signed   By: Yvonne Kendall M.D.   On: 11/25/2021 14:21   DG Chest Portable 1 View  Result Date: 11/25/2021 CLINICAL DATA:  Altered mental status after falling yesterday and striking head. EXAM: PORTABLE CHEST 1 VIEW COMPARISON:  Radiographs 09/11/2021 and 08/07/2021.  CT 09/11/2021. FINDINGS: 1333 hours. Left IJ Port-A-Cath tip is unchanged at the level of the mid SVC. The heart size and mediastinal contours are stable. Stable chronic lung disease with emphysema and scattered ground-glass opacities. No superimposed edema, confluent airspace opacity, pleural effusion or pneumothorax. The bones appear unchanged. IMPRESSION: Stable radiographic appearance of the chest with chronic interstitial lung disease. No evidence  of acute cardiopulmonary process. Electronically Signed   By: Richardean Sale M.D.   On: 11/25/2021 13:47   CT MAXILLOFACIAL WO CONTRAST  Result Date: 11/25/2021 CLINICAL DATA:  Mental status change of unknown cause. History of maxillary sinus cancer. EXAM: CT MAXILLOFACIAL WITHOUT CONTRAST TECHNIQUE: Multidetector CT imaging of the maxillofacial structures was performed. Multiplanar CT image reconstructions were also generated. RADIATION DOSE REDUCTION: This exam was performed according to the departmental dose-optimization program which includes automated exposure control, adjustment of the mA and/or kV according to patient size and/or use of iterative reconstruction technique. COMPARISON:  Maxillofacial CT 12/03/2020.  PET scan 07/08/2021. FINDINGS: Osseous: Previous extensive mid face resection on the right for treatment of a maxillary sinus malignancy. Since the prior studies, there has been progressive lytic destruction of the skull base in the region of the carotid canal and foramen ovale, with tumor extension into the right division of the sphenoid sinus, medial aspect of the right middle cranial fossa and probably cavernous sinus region. Orbits: Orbits remain negative Sinuses: Tumor extension into the right division of the sphenoid sinus as noted above. Chronic extirpation of the right maxillary sinus and maxilla. Soft tissues: No other area of definable soft tissue tumor progression. Limited intracranial: As above. Additional note of pterional craniotomy and right MCA aneurysm clip. IMPRESSION: Tumor progression with destruction of the skull base on the right in the region of the foramen ovale and carotid canal with tumor extension into the right division of the  sphenoid sinus and medial aspect of the middle cranial fossa. These abnormalities could be better evaluated with MRI. Electronically Signed   By: Nelson Chimes M.D.   On: 11/25/2021 14:46   ASSESSMENT AND PLAN:   BECKA LAGASSE is a 67  y.o. 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 Patient states that she was trying to get out of bed when she fell yesterday and was unable to get up.  She was able to get up with assistance of her friend and roommate.   Sepsis from urinary source --As evidenced by fever with a T-max of 102 (at home), tachycardia, leukocytosis, lactic acidosis and pyuria. -- Patient hemodynamically stable. -- Denies any symptoms of dysuria -- will give Rocephin for three days -- blood culture negative -- urine culture not sent by ER -- patient remains afebrile.  History of paroxysmal A. Fib --Continue metoprolol for rate control --Continue apixaban as primary prophylaxis for an acute stroke   maxillary sinus cancer --Continue fentanyl patch and oxycodone --Patient is no longer receiving chemotherapy/immunotherapy due to declining functional status -- palliative care consult appreciated. Patient wants to remain full code. She is not ready for hospice yet. Seen by Dr. Tasia Catchings from cancer center. Not a candidate for any further treatment at present. Patient does not want to pursue any MRI or further workup.    Hypokalemia Supplement potassium IV   Moderate protein calorie malnutrition (BMI 17.04) Secondary to dysphagia related to head and neck cancer Continue Megace Continue Ensure with meals    Nicotine dependence Smoking cessation has been discussed with patient Will place patient on a nicotine transdermal patch 7 mg daily    Fall Most likely secondary to weakness from chronic illness with superimposed sepsis seen by PT-- recommends rehab. Per my discussion with patient she wants to return back to staying with her roommate.  Chronic  anemia -- no evidence of any G.I. bleeding -- hemoglobin 7.1 -- continue to monitor. Transfuse as needed  Procedures: Family communication : none Consults : palliative oncology CODE STATUS: full DVT Prophylaxis : eliquis Level of care: Telemetry Medical Status is: Inpatient  Remains inpatient appropriate because: discharge planning patient getting IV potassium since she is not able to tolerate overall due to her previous surgery for maxillary sinus. Anticipate discharge 127      TOTAL TIME TAKING CARE OF THIS PATIENT: 25 minutes.  >50% time spent on counselling and coordination of care  Note: This dictation was prepared with Dragon dictation along with smaller phrase technology. Any transcriptional errors that result from this process are unintentional.  Fritzi Mandes M.D    [facility]   CC: Primary care physician; Pcp, No Patient ID: [name], female   DOB: 1956-04-20, 65 y.o.   MRN: [id number]

## 2021-11-27 NOTE — Progress Notes (Signed)
[place] CSW Progress Note  Clinical Education officer, museum contacted caregiver by phone to follow-up on discussion about housing needs.  CSW left voicemail with contact information and request for a return call.    Adelene Amas , LCSW

## 2021-11-28 ENCOUNTER — Encounter: Payer: Self-pay | Admitting: Licensed Clinical Social Worker

## 2021-11-28 DIAGNOSIS — W19XXXA Unspecified fall, initial encounter: Secondary | ICD-10-CM | POA: Diagnosis not present

## 2021-11-28 DIAGNOSIS — E876 Hypokalemia: Secondary | ICD-10-CM

## 2021-11-28 DIAGNOSIS — Z515 Encounter for palliative care: Secondary | ICD-10-CM | POA: Diagnosis not present

## 2021-11-28 DIAGNOSIS — E440 Moderate protein-calorie malnutrition: Secondary | ICD-10-CM | POA: Diagnosis not present

## 2021-11-28 DIAGNOSIS — A419 Sepsis, unspecified organism: Secondary | ICD-10-CM | POA: Diagnosis not present

## 2021-11-28 LAB — POTASSIUM: Potassium: 3.4 mmol/L — ABNORMAL LOW (ref 3.5–5.1)

## 2021-11-28 LAB — HEMOGLOBIN: Hemoglobin: 8 g/dL — ABNORMAL LOW (ref 12.0–15.0)

## 2021-11-28 MED ORDER — SODIUM CHLORIDE 0.9 % IV SOLN: Freq: Once | INTRAVENOUS | Status: AC

## 2021-11-28 MED ORDER — POTASSIUM PHOSPHATES 15 MMOLE/5ML IV SOLN
30.0000 mmol | Freq: Once | INTRAVENOUS | Status: AC
  Administered 2021-11-28: 30 mmol via INTRAVENOUS
  Filled 2021-11-28: qty 10

## 2021-11-28 MED ORDER — SODIUM CHLORIDE 0.9 % IV SOLN
2.0000 g | INTRAVENOUS | Status: DC
  Administered 2021-11-28: 2 g via INTRAVENOUS
  Filled 2021-11-28 (×2): qty 20

## 2021-11-28 NOTE — Progress Notes (Signed)
Palliative care follow-up note:  Patient discharging home later today.  She sent me a message via MyChart due to concern that the cancer center would no longer be covering cost of her pain medications.  Discussed with social work and these medication should be covered via insurance Teacher, music).  Additionally, I explained to patient that hospice would cover these medications if that is ultimately a route that she wished to pursue.  Patient said that she wanted to think about it and speak to her roommate.  I am scheduled to have follow-up with her via telephone call next week.  Time Total: 15 minutes  Visit consisted of counseling and education dealing with the complex and emotionally intense issues of symptom management and palliative care in the setting of serious and potentially life-threatening illness.Greater than 50%  of this time was spent counseling and coordinating care related to the above assessment and plan.  Signed by: Altha Harm, PhD, NP-C

## 2021-11-28 NOTE — Progress Notes (Signed)
Patient ID: Shannon Schroeder, female   DOB: 04-07-1956, 66 y.o.   MRN: [id number] With RN Estill Bamberg in the room discussed with patient again regarding her discharge planning. Discussed with patient to reconsider rehab given weakness and overall deconditioning. Patient is adamant that she will go home with a roommate. She also does not want home health PT set up neither does she want EMS to transport. She tells me her roommate will take her home in her car and will take her to the house. House is [address]. Concern was raised regarding fall and inability for roommate to care for her at home. Patient said her roommate is able to and this is her family decision to go home. Patient received fluid bolus earlier.

## 2021-11-28 NOTE — Evaluation (Signed)
Occupational Therapy Evaluation Patient Details Name: Shannon Schroeder MRN: [id number] DOB: Feb 21, 1956 Today's Date: 11/28/2021   History of Present Illness 66 y.o. F who presents to ER for evaluation of generalized weakness after falling on 1/23. Pt. reports hitting her head. PMH: Dysphagia, Dysarthria, History of Cancer, COPD, HTN, Depression, TIA, A. Fib, seizures.   Clinical Impression   Shannon Schroeder was seen for OT evaluation this date. Prior to hospital admission, pt required assist for ADLs and mobility from roommate, reports largely bed level or using BSC for ADLs. Pt lives with roommate in home c 4 STE and B rails. Pt presents to acute OT demonstrating impaired ADL performance and functional mobility 2/2 decreased activity tolerance and functional strength/ROM/balance deficits. Pt currently requires MIN A don/doff underwear at bed level - pt reports bridging at baseline. SETUP self-feeding at bed level. Clears rear for chuck removal using BUE support on bed however unable to rise with RW - requests to trial home transfer technique. Stands with MOD A and BUE over OT neck, in standing pt reaches for RW and tolerates ~3 min standing with CGA including ~9ft side steps along EOB. Pt would benefit from skilled OT to address noted impairments and functional limitations (see below for any additional details). Upon hospital discharge, recommend STR to maximize pt safety and return to PLOF.       Recommendations for follow up therapy are one component of a multi-disciplinary discharge planning process, led by the attending physician.  Recommendations may be updated based on patient status, additional functional criteria and insurance authorization.   Follow Up Recommendations  Skilled nursing-short term rehab (<3 hours/day)    Assistance Recommended at Discharge Frequent or constant Supervision/Assistance  Patient can return home with the following A lot of help with walking and/or transfers;A  lot of help with bathing/dressing/bathroom;Assistance with cooking/housework;Help with stairs or ramp for entrance    Functional Status Assessment  Patient has had a recent decline in their functional status and demonstrates the ability to make significant improvements in function in a reasonable and predictable amount of time.  Equipment Recommendations  BSC/3in1;Hospital bed    Recommendations for Other Services       Precautions / Restrictions Precautions Precautions: Fall Restrictions Weight Bearing Restrictions: No      Mobility Bed Mobility Overal bed mobility: Needs Assistance Bed Mobility: Supine to Sit, Sit to Supine     Supine to sit: Min guard Sit to supine: Min guard   General bed mobility comments: bed rail    Transfers Overall transfer level: Needs assistance Equipment used: Rolling walker (2 wheels) Transfers: Sit to/from Stand Sit to Stand: Mod assist           General transfer comment: Clears rear for chuck removal using BUE support on bed however unable to rise with RW - requests to trial home transfer technique. Stands with MOD A and BUE over OT neck, in standing pt reaches for RW and tolerates ~3 min standing with CGA including ~73ft side steps along EOB      Balance Overall balance assessment: Needs assistance Sitting-balance support: Bilateral upper extremity supported, Feet supported Sitting balance-Leahy Scale: Fair     Standing balance support: Bilateral upper extremity supported Standing balance-Leahy Scale: Poor                             ADL either performed or assessed with clinical judgement   ADL Overall ADL's : Needs assistance/impaired  General ADL Comments: MIN A don/doff underwear at bed level - pt reports bridging at baseline. MOD A for ADL t/f. SETUP self-feeding at bed level      Pertinent Vitals/Pain Pain Assessment Pain Assessment: No/denies pain      Hand Dominance     Extremity/Trunk Assessment Upper Extremity Assessment Upper Extremity Assessment: Generalized weakness   Lower Extremity Assessment Lower Extremity Assessment: Generalized weakness       Communication Communication Communication: Other (comment) (dysarthria)   Cognition Arousal/Alertness: Awake/alert Behavior During Therapy: WFL for tasks assessed/performed Overall Cognitive Status: Within Functional Limits for tasks assessed                                       General Comments  SUPINE: BP 126/75, MAP 89, HR 112            Home Living Family/patient expects to be discharged to:: Private residence Living Arrangements: Non-relatives/Friends Available Help at Discharge: Other (Comment) (roommate) Type of Home: House Home Access: Stairs to enter [employer] of Steps: 4 Entrance Stairs-Rails: Right;Left;Can reach both Home Layout: One level     Bathroom Shower/Tub: Tub/shower unit         Home Equipment: Conservation officer, nature (2 wheels);BSC/3in1;Shower seat;Wheelchair - manual          Prior Functioning/Environment Prior Level of Function : Needs assist             Mobility Comments: Pt reports roommate/caregiver helps w/ mobility/transportation ADLs Comments: Pt reports roommate/caregiver helps with ADLs        OT Problem List: Decreased strength;Decreased range of motion;Decreased activity tolerance;Impaired balance (sitting and/or standing);Decreased safety awareness      OT Treatment/Interventions: Self-care/ADL training;Therapeutic exercise;Energy conservation;DME and/or AE instruction;Therapeutic activities;Patient/family education;Balance training    OT Goals(Current goals can be found in the care plan section) Acute Rehab OT Goals Patient Stated Goal: to go home OT Goal Formulation: With patient Time For Goal Achievement: 12/12/21 Potential to Achieve Goals: Fair  OT Frequency: Min 2X/week     Co-evaluation              AM-PAC OT "6 Clicks" Daily Activity     Outcome Measure Help from another person eating meals?: None Help from another person taking care of personal grooming?: A Little Help from another person toileting, which includes using toliet, bedpan, or urinal?: A Lot Help from another person bathing (including washing, rinsing, drying)?: A Lot Help from another person to put on and taking off regular upper body clothing?: A Little Help from another person to put on and taking off regular lower body clothing?: A Little 6 Click Score: 17   End of Session Equipment Utilized During Treatment: Rolling walker (2 wheels) Nurse Communication: Mobility status  Activity Tolerance: Patient tolerated treatment well Patient left: in bed;with call bell/phone within reach;with bed alarm set  OT Visit Diagnosis: Other abnormalities of gait and mobility (R26.89);Muscle weakness (generalized) (M62.81)                Time: 1478-2956 OT Time Calculation (min): 24 min Charges:  OT General Charges $OT Visit: 1 Visit OT Evaluation $OT Eval Moderate Complexity: 1 Mod OT Treatments $Self Care/Home Management : 8-22 mins  Dessie Coma, M.S. OTR/L  11/28/21, 2:31 PM  ascom [phone]

## 2021-11-28 NOTE — Progress Notes (Signed)
[place] Work  Clinical Social Work was referred by Billey Chang NP for assessment of psychosocial needs.  Clinical Social Worker contacted caregiver by phone  to offer support and assess for needs.  CSW spoke with patient's primary care giver Shannon Schroeder, who stated the plan is for discharge today.  CSW noted Shannon Borders NP, as well as the [facility] case manager, had spoken with the patient about hospice care and the patient refused to consider it. Shannon Schroeder stated she spoke with the patient and told her, they needed to get hospice involved because she [Shannon Schroeder] would not be able to work, if she continued to care for the patient through out the day.  Shannon Schroeder stated the patient would consider hospice once Shannon Schroeder started working.  CSW encouraged Shannon Schroeder to speak with the patient about starting hospice while Shannon Schroeder was still able to be at the house, to ease the transition.  Shannon Schroeder agreed.  CSW also reminded Shannon Schroeder she could receive more assistance from [facility] for personal care services, but she would need to contact the DSS Medicaid coordination to apply for the assistance. Shannon Schroeder mentioned the [facility] was no longer covering the patient's medications.  Shannon Schroeder stated the patient is still receiving her medications from [facility], and the co-pay for the medications was 2.97 and 1.50.  CSW stated that the medication was probably run thru [place] and the medicaid co-pay would not be more than 3.00 per medications. Shannon Schroeder verbalized understanding. CSW updated Shannon Schroeder on rent assistance, the agreement was to assist for a month's rent if the property management office was willing to agree to stop the eviction proceedings.  Shannon Schroeder stated she spoke with Shannon Schroeder at [facility] and they have agreed to a payment plan.  CSW stated I would update the agency and I requested Shannon Schroeder's contact information.  CSW stated the agency will contact Shannon Schroeder directly.   Shannon Schroeder verbalized understanding.  CSW will contact Shannon Schroeder with update on rental assistance.      Shannon Schroeder, [place]

## 2021-11-28 NOTE — Progress Notes (Signed)
Pt discharged home, discharge instructions reviewd with pt and roommate, states understanding, pt with no complaints, pts roommate assisted pt to wheelchair and to car per pt and roommate request

## 2021-11-28 NOTE — Discharge Summary (Signed)
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    Anxiety    Arthritis    Bipolar disorder (HCC)    COPD (chronic obstructive pulmonary disease) (HCC)    NO INHALERS   Depression    Dysrhythmia    H/O V TACH   Head and neck cancer ([place]) 07/29/2020   Headache    H/O MIGRAINES   Hypertension    Seizures ([place])    X1 AFTER FALL    HOSPITAL COURSE:  Shannon Schroeder is a 66 y.o. 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 Patient states that she was trying to get out of bed when she fell yesterday and was unable to get up.  She was able to get up with assistance of her friend and roommate.     Sepsis from urinary source --As evidenced by fever with a T-max of 102 (at home), tachycardia, leukocytosis, lactic acidosis and pyuria. -- Patient hemodynamically stable. -- Denies any symptoms of dysuria -- will give Rocephin for three days -- blood culture negative -- urine culture not sent by ER -- patient remains afebrile. --sepsis improved    History of paroxysmal A. Fib --Continue  metoprolol for rate control --Continue apixaban as primary prophylaxis for an acute stroke   maxillary sinus cancer --Continue fentanyl patch and oxycodone --Patient is no longer receiving chemotherapy/immunotherapy due to declining functional status -- palliative care consult appreciated. Patient wants to remain full code. She is not ready for hospice yet. Seen by Dr. Tasia Catchings from cancer center. Not a candidate for any further treatment at present. Patient does not want to pursue any MRI or further workup.    Hypokalemia -Supplement potassium IV since pt is not able to tolerate po/liquid due to her right facial anatomy from previous sx -pt advised keep diet rich in K    Moderate protein calorie malnutrition (BMI 17.04) -Secondary to dysphagia related to head and neck cancer -Continue Ensure with meals   Nicotine dependence -Smoking cessation has been discussed with patient - patient on a nicotine transdermal patch 7 mg daily   Fall Most likely secondary to weakness from chronic illnessseen by PT-- recommends rehab. Per my discussion with patient she wants to return back to staying with her roommate.   Chronic anemia -- no evidence of any G.I. bleeding -- hemoglobin 7.1--8.0  there are several medications which patient is not taking as per mec rec done on admission have been discontinued.    Procedures: Family communication : none Consults : palliative oncology CODE STATUS: full DVT Prophylaxis : eliquis Level of care: Telemetry Medical Status is: Inpatient   patient overall feels better. She will discharge today after finishing, potassium  drip. She will be staying with her roommate. She does not want to consider rehab. Social worker at the cancer center is working for her housing.  CONSULTS OBTAINED:    DRUG ALLERGIES:   Allergies  Allergen Reactions   Naproxen Swelling, Rash and Other (See Comments)   Aspirin Swelling and Other (See Comments)   Belladonna Alkaloids Rash    Fluoxetine Hives and Rash   Fluoxetine Hcl Other (See Comments) and Rash   Paroxetine Hcl Rash and Other (See Comments)   Phenobarbital Rash and Other (See Comments)   Prozac [Fluoxetine Hcl] Rash    DISCHARGE MEDICATIONS:   Allergies as of 11/28/2021       Reactions   Naproxen Swelling, Rash, Other (See Comments)   Aspirin Swelling, Other (See Comments)   Belladonna Alkaloids Rash   Fluoxetine Hives, Rash   Fluoxetine Hcl Other (See Comments), Rash   Paroxetine Hcl Rash, Other (See Comments)   Phenobarbital Rash, Other (See Comments)   Prozac [fluoxetine Hcl] Rash        Medication List     STOP taking these medications    amiodarone 200 MG tablet Commonly known as: PACERONE   chlorhexidine 0.12 % solution Commonly known as: Peridex   furosemide 20 MG tablet Commonly known as: LASIX   lidocaine-prilocaine cream Commonly known as: EMLA   magnesium chloride 64 MG Tbec SR tablet Commonly known as: SLOW-MAG   megestrol 625 MG/5ML suspension Commonly known as: MEGACE ES   Melatonin 10 MG Tabs   potassium chloride SA 20 MEQ tablet Commonly known as: KLOR-CON M   predniSONE 50 MG tablet Commonly known as: DELTASONE       TAKE these medications    Advair HFA 45-21 MCG/ACT inhaler Generic drug: fluticasone-salmeterol INHALE 2 PUFFS INTO THE LUNGS TWICE DAILY   albuterol 108 (90 Base) MCG/ACT inhaler Commonly known as: VENTOLIN HFA Inhale 2 puffs into the lungs every 6 (six) hours as needed for wheezing or shortness of breath.   CENTRUM SILVER PO Take 1 tablet by mouth daily. Gummie   docusate sodium 100 MG capsule Commonly known as: Colace Take 1 capsule (100 mg total) by mouth daily.   Eliquis 5 MG Tabs tablet Generic drug: apixaban TAKE 1 TABLET BY MOUTH TWICE DAILY   feeding supplement Liqd Take 237 mLs by mouth 3 (three) times daily between meals.   fentaNYL 100 MCG/HR Commonly known as: [place] 1 patch onto the skin every 3  (three) days.   gabapentin 300 MG capsule Commonly known as: NEURONTIN Take 2 capsules (600 mg total) by mouth 3 (three) times daily.   metoprolol tartrate 25 MG tablet Commonly known as: LOPRESSOR TAKE 1/2 TABLET BY MOUTH TWICE DAILY   ondansetron 8 MG disintegrating tablet Commonly known as: ZOFRAN-ODT Take 1 tablet (8 mg total) by mouth every 8 (eight) hours as needed for nausea or vomiting.   oxcarbazepine 600 MG tablet Commonly known as: Trileptal Take 1 tablet (600 mg total) by mouth 2 (two) times daily.   oxycodone 30 MG immediate release tablet Commonly known as: ROXICODONE Take 1 tablet (30 mg total) by mouth every 4 (four) hours as needed for pain.   polyethylene glycol powder 17 GM/SCOOP powder Commonly known as: GLYCOLAX/MIRALAX Take 17 g by mouth daily as needed for mild constipation or moderate constipation.   prochlorperazine 10 MG tablet Commonly known as: COMPAZINE Take 1 tablet (10 mg total) by mouth every 6 (six) hours as needed (Nausea or vomiting).  traZODone 50 MG tablet Commonly known as: DESYREL Take 1-2 tablets (50-100 mg total) by mouth at bedtime as needed for sleep.        If you experience worsening of your admission symptoms, develop shortness of breath, life threatening emergency, suicidal or homicidal thoughts you must seek medical attention immediately by calling 911 or calling your MD immediately  if symptoms less severe.  You Must read complete instructions/literature along with all the possible adverse reactions/side effects for all the Medicines you take and that have been prescribed to you. Take any new Medicines after you have completely understood and accept all the possible adverse reactions/side effects.   Please note  You were cared for by a hospitalist during your hospital stay. If you have any questions about your discharge medications or the care you received while you were in the hospital after you are discharged, you can  call the unit and asked to speak with the hospitalist on call if the hospitalist that took care of you is not available. Once you are discharged, your primary care physician will handle any further medical issues. Please note that NO REFILLS for any discharge medications will be authorized once you are discharged, as it is imperative that you return to your primary care physician (or establish a relationship with a primary care physician if you do not have one) for your aftercare needs so that they can reassess your need for medications and monitor your lab values. Today   SUBJECTIVE   No new complaints  VITAL SIGNS:  Blood pressure (!) 107/51, pulse 98, temperature 98.8 F (37.1 C), resp. rate 19, height 5\' 6"  (1.676 m), weight 47.9 kg, last menstrual period 11/19/1988, SpO2 100 %.  I/O:   Intake/Output Summary (Last 24 hours) at 11/28/2021 1029 Last data filed at 11/28/2021 0958 Gross per 24 hour  Intake 340 ml  Output --  Net 340 ml    PHYSICAL EXAMINATION:  GENERAL:  66 y.o.-year-old patient lying in the bed with no acute distress.  HEENT: Head atraumatic, normocephalic. Right-sided facial deformity due to her maxillary cancer  LUNGS: Normal breath sounds bilaterally, no wheezing, rales, rhonchi.  CARDIOVASCULAR: S1, S2 normal. No murmurs, rubs, or gallops.  ABDOMEN: Soft, nontender, nondistended. Bowel sounds present.  EXTREMITIES: No  edema b/l.    NEUROLOGIC: nonfocal patient is alert and awake  DATA REVIEW:   CBC  Recent Labs  Lab 11/27/21 0436 11/28/21 0811  WBC 16.7*  --   HGB 7.1* 8.0*  HCT 21.1*  --   PLT 532*  --     Chemistries  Recent Labs  Lab 11/25/21 1222 11/26/21 1150 11/27/21 0436 11/28/21 0647  NA 134*  --  133*  --   K 3.3*  --  2.9* 3.4*  CL 94*  --  99  --   CO2 29  --  30  --   GLUCOSE 123*  --  70  --   BUN 15  --  12  --   CREATININE 0.39*  --  0.41*  --   CALCIUM 8.5*  --  7.2*  --   MG  --    < > 2.1  --   AST 20  --   --   --    ALT 9  --   --   --   ALKPHOS 173*  --   --   --   BILITOT 0.8  --   --   --    < > =  values in this interval not displayed.    Microbiology Results   Recent Results (from the past 240 hour(s))  Resp Panel by RT-PCR (Flu A&B, Covid)     Status: None   Collection Time: 11/25/21  2:47 PM   Specimen: Nasopharyngeal(NP) swabs in vial transport medium  Result Value Ref Range Status   SARS Coronavirus 2 by RT PCR NEGATIVE NEGATIVE Final    Comment: (NOTE) SARS-CoV-2 target nucleic acids are NOT DETECTED.  The SARS-CoV-2 RNA is generally detectable in upper respiratory specimens during the acute phase of infection. The lowest concentration of SARS-CoV-2 viral copies this assay can detect is 138 copies/mL. A negative result does not preclude SARS-Cov-2 infection and should not be used as the sole basis for treatment or other patient management decisions. A negative result may occur with  improper specimen collection/handling, submission of specimen other than nasopharyngeal swab, presence of viral mutation(s) within the areas targeted by this assay, and inadequate number of viral copies(<138 copies/mL). A negative result must be combined with clinical observations, patient history, and epidemiological information. The expected result is Negative.  Fact Sheet for Patients:  EntrepreneurPulse.com.au  Fact Sheet for Healthcare Providers:  IncredibleEmployment.be  This test is no t yet approved or cleared by the [place] FDA and  has been authorized for detection and/or diagnosis of SARS-CoV-2 by FDA under an Emergency Use Authorization (EUA). This EUA will remain  in effect (meaning this test can be used) for the duration of the COVID-19 declaration under Section 564(b)(1) of the Act, 21 U.S.C.section 360bbb-3(b)(1), unless the authorization is terminated  or revoked sooner.       Influenza A by PCR NEGATIVE NEGATIVE Final   Influenza B by  PCR NEGATIVE NEGATIVE Final    Comment: (NOTE) The Xpert Xpress SARS-CoV-2/FLU/RSV plus assay is intended as an aid in the diagnosis of influenza from Nasopharyngeal swab specimens and should not be used as a sole basis for treatment. Nasal washings and aspirates are unacceptable for Xpert Xpress SARS-CoV-2/FLU/RSV testing.  Fact Sheet for Patients: EntrepreneurPulse.com.au  Fact Sheet for Healthcare Providers: IncredibleEmployment.be  This test is not yet approved or cleared by the [place] FDA and has been authorized for detection and/or diagnosis of SARS-CoV-2 by FDA under an Emergency Use Authorization (EUA). This EUA will remain in effect (meaning this test can be used) for the duration of the COVID-19 declaration under Section 564(b)(1) of the Act, 21 U.S.C. section 360bbb-3(b)(1), unless the authorization is terminated or revoked.  Performed at [facility], [address] [place], [place] [address]   Blood Culture (routine x 2)     Status: None (Preliminary result)   Collection Time: 11/25/21  2:47 PM   Specimen: BLOOD  Result Value Ref Range Status   Specimen Description BLOOD LEFT ANTECUBITAL  Final   Special Requests BOTTLES DRAWN AEROBIC AND ANAEROBIC BCAV  Final   Culture   Final    NO GROWTH 3 DAYS Performed at [facility], [address], [place],  [address]    Report Status PENDING  Incomplete  Blood Culture (routine x 2)     Status: None (Preliminary result)   Collection Time: 11/25/21  2:47 PM   Specimen: BLOOD  Result Value Ref Range Status   Specimen Description BLOOD BLOOD RIGHT FOREARM  Final   Special Requests BOTTLES DRAWN AEROBIC AND ANAEROBIC BCLV  Final   Culture   Final    NO GROWTH 3 DAYS Performed at [facility], [address], [place], [place] [address]    Report Status PENDING  Incomplete    RADIOLOGY:  No results found.   CODE STATUS:     Code  Status Orders  (From admission, onward)           Start     Ordered   11/25/21 1646  Full code  Continuous        11/25/21 1647           Code Status History     Date Active Date Inactive Code Status Order ID Comments User Context   07/30/2021 1126 08/08/2021 2129 Full Code [id number]  Collier Bullock, MD ED   09/06/2020 1926 09/11/2020 1830 Full Code [id number]  Ivor Costa, MD ED   08/08/2020 1405 08/10/2020 1840 Full Code [id number]  Ivor Costa, MD ED        TOTAL TIME TAKING CARE OF THIS PATIENT: 35 minutes.    Fritzi Mandes M.D  [facility]    CC: Primary care physician; Pcp, No

## 2021-11-28 NOTE — Progress Notes (Signed)
PHARMACY CONSULT NOTE - FOLLOW UP  Pharmacy Consult for Electrolyte Monitoring and Replacement   Recent Labs: Potassium (mmol/L)  Date Value  11/28/2021 3.4 (L)   Potassium, Ser (no units)  Date Value  06/26/2015 4.2   Magnesium (mg/dL)  Date Value  11/27/2021 2.1   Calcium (mg/dL)  Date Value  11/27/2021 7.2 (L)   Calcium, Ser (no units)  Date Value  12/18/2016 8.9   Albumin (g/dL)  Date Value  11/25/2021 2.2 (L)  03/15/2019 4.5   Albumin Serum (no units)  Date Value  12/18/2016 4.5   Phosphorus (mg/dL)  Date Value  11/27/2021 2.2 (L)   Sodium (mmol/L)  Date Value  11/27/2021 133 (L)  03/15/2019 135     Assessment: Patient admitted with increased weakness. PMH includes  maxillary cancer, COPD, nicotine dependence, bipolar disorder, and TIA. Pharmacy consulted to manage electrolytes.  Goal of Therapy:  Electrolytes WNL  Plan:  Calcium and Mg remain within normal limits. Potassium improved from 2.9 yesterday but remains under desired goal range. Noted Phos resulted at 2.2 yesterday evening. Will replace electrolytes with Kphos 57mmol IVPB x 1 dose today and continue to follow daily labs to replace electrolytes as needed.  Orville Mena Rodriguez-Guzman PharmD, BCPS 11/28/2021 8:38 AM

## 2021-11-28 NOTE — TOC Progression Note (Addendum)
Transition of Care [facility]) - Progression Note    Patient Details  Name: Shannon Schroeder MRN: [id number] Date of Birth: October 08, 1956  [facility]) CM/SW [place], RN Phone Number: 11/28/2021, 3:18 PM  Clinical Narrative:   Patient is considering hospice, but continues to refuse SNF.  Attempted to reach roommate, however left a message.    Addendum 1527 Patient stated she did not want home health or EMS transport.         Expected Discharge Plan and Services           Expected Discharge Date: 11/28/21                                     Social Determinants of Health (SDOH) Interventions    Readmission Risk Interventions Readmission Risk Prevention Plan 08/01/2021 09/08/2020  Transportation Screening Complete Complete  PCP or Specialist Appt within 3-5 Days Complete Complete  HRI or [place] Complete Complete  Social Work Consult for [place] Planning/Counseling Complete Complete  Palliative Care Screening Complete Not Applicable  Medication Review Press photographer) Complete Complete  Some recent data might be hidden

## 2021-11-28 NOTE — Care Management Important Message (Signed)
Important Message  Patient Details  Name: Shannon Schroeder MRN: [id number] Date of Birth: 1956-06-03   Medicare Important Message Given:  Yes     [name] A [name] 11/28/2021, 11:40 AM

## 2021-11-28 NOTE — Progress Notes (Addendum)
Physical Therapy Treatment Patient Details Name: Shannon Schroeder MRN: [id number] DOB: May 17, 1956 Today's Date: 11/28/2021   History of Present Illness 66 y.o. F who presents to ER for evaluation of generalized weakness after falling on 1/23. Pt. reports hitting her head. PMH: Dysphagia, Dysarthria, History of Cancer, COPD, HTN, Depression, TIA, A. Fib, seizures.    PT Comments    Pt was awake and alert resting in bed upon PT entrance into room for today's session. Pt denies any c/o of pain, but states she "just feels weak" when asked about pain level. She was able to perform bed mobility w/ CGA and reliance on bed rails due to weakness. Once seated EOB, Pt reports c/o dizziness. BP was +orthostatic (BP: 124/69 HR: 93 in supine w/ HOB elevated; BP: 101/75 HR: 101 @ EOB). After performing seated leg extensions and marching at EOB, PT attempted to record BP again before possibly standing.  However, Pt impulsively lied back down, stating "I need to lay down". No further PT interventions were performed after that, but PT educated her on the importance of trying to get up if she hopes to go home. BP was taken before exiting the room and was 117/68, HR: 91. Pt will benefit from continued skilled PT in order to improve LE strength, mobility, gait, and restore PLOF. Current discharge recommendation remains appropriate due to the level of assistance required by the patient to ensure safety and improve overall function.   Recommendations for follow up therapy are one component of a multi-disciplinary discharge planning process, led by the attending physician.  Recommendations may be updated based on patient status, additional functional criteria and insurance authorization.  Follow Up Recommendations  Skilled nursing-short term rehab (<3 hours/day)     Assistance Recommended at Discharge Frequent or constant Supervision/Assistance  Patient can return home with the following A lot of help with walking  and/or transfers;A lot of help with bathing/dressing/bathroom;Direct supervision/assist for medications management;Assistance with feeding;Assistance with cooking/housework;Help with stairs or ramp for entrance   Equipment Recommendations  None recommended by PT    Recommendations for Other Services       Precautions / Restrictions Precautions Precautions: Fall Restrictions Weight Bearing Restrictions: No     Mobility  Bed Mobility Overal bed mobility: Needs Assistance Bed Mobility: Supine to Sit; sit to supine     Supine to sit: Min guard Sit to supine: minA Guard     General bed mobility comments: utilizes bed railing due to weakness    Transfers                        Ambulation/Gait                   Stairs             Wheelchair Mobility    Modified Rankin (Stroke Patients Only)       Balance Overall balance assessment: Needs assistance Sitting-balance support: Bilateral upper extremity supported, Feet supported Sitting balance-Leahy Scale: Poor                                      Cognition Arousal/Alertness: Awake/alert Behavior During Therapy: WFL for tasks assessed/performed Overall Cognitive Status: Within Functional Limits for tasks assessed  Exercises      General Comments        Pertinent Vitals/Pain Pain Assessment Pain Assessment: No/denies pain (Reports just feeling weak)    Home Living                          Prior Function            PT Goals (current goals can now be found in the care plan section) Progress towards PT goals: Progressing toward goals    Frequency    Min 2X/week      PT Plan      Co-evaluation              AM-PAC PT "6 Clicks" Mobility   Outcome Measure  Help needed turning from your back to your side while in a flat bed without using bedrails?: A Little Help needed moving from  lying on your back to sitting on the side of a flat bed without using bedrails?: A Little Help needed moving to and from a bed to a chair (including a wheelchair)?: A Lot Help needed standing up from a chair using your arms (e.g., wheelchair or bedside chair)?: A Lot Help needed to walk in hospital room?: A Lot Help needed climbing 3-5 steps with a railing? : A Lot 6 Click Score: 14    End of Session Equipment Utilized During Treatment: Gait belt Activity Tolerance: Patient limited by lethargy;Patient limited by fatigue (Pt. reported feeling dizzy; BP: 124/69 in supine, BP: 101/75 @ EOB.) Patient left: in bed;with call bell/phone within reach;with bed alarm set Nurse Communication: Mobility status PT Visit Diagnosis: Unsteadiness on feet (R26.81);History of falling (Z91.81);Muscle weakness (generalized) (M62.81);Other abnormalities of gait and mobility (R26.89)     Time: 1040-1056 PT Time Calculation (min) (ACUTE ONLY): 16 min  Charges:                         Jonnie Kind, SPT 11/28/2021, 12:32 PM

## 2021-11-29 ENCOUNTER — Encounter: Payer: Self-pay | Admitting: Hospice and Palliative Medicine

## 2021-11-30 LAB — CULTURE, BLOOD (ROUTINE X 2)
Culture: NO GROWTH
Culture: NO GROWTH

## 2021-12-01 ENCOUNTER — Other Ambulatory Visit: Payer: Self-pay | Admitting: Hospice and Palliative Medicine

## 2021-12-01 ENCOUNTER — Encounter: Payer: Self-pay | Admitting: Hospice and Palliative Medicine

## 2021-12-01 MED ORDER — FENTANYL 100 MCG/HR TD PT72: 1.0000 | MEDICATED_PATCH | TRANSDERMAL | 0 refills | Status: AC

## 2021-12-01 MED ORDER — OXYCODONE HCL 30 MG PO TABS: 30.0000 mg | ORAL_TABLET | ORAL | 0 refills | Status: AC | PRN

## 2021-12-04 ENCOUNTER — Inpatient Hospital Stay: Payer: Medicare Other | Attending: Hospice and Palliative Medicine | Admitting: Hospice and Palliative Medicine

## 2021-12-04 ENCOUNTER — Other Ambulatory Visit: Payer: Self-pay

## 2021-12-04 DIAGNOSIS — C760 Malignant neoplasm of head, face and neck: Secondary | ICD-10-CM

## 2021-12-04 DIAGNOSIS — G893 Neoplasm related pain (acute) (chronic): Secondary | ICD-10-CM

## 2021-12-04 DIAGNOSIS — Z515 Encounter for palliative care: Secondary | ICD-10-CM

## 2021-12-04 NOTE — Progress Notes (Signed)
Virtual Visit via Video Note  I connected with [name] on 12/04/21 at  1:50 PM EST by a video enabled telemedicine application and verified that I am speaking with the correct person using two identifiers.  Location: Patient: Home Provider: Clinic   I discussed the limitations of evaluation and management by telemedicine and the availability of in person appointments. The patient expressed understanding and agreed to proceed.  History of Present Illness: Shannon Schroeder is a 66 y.o. 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  Patient was hospitalized 07/30/2021-08/08/2021 with multifocal pneumonia versus possible drug-induced pneumonitis.  Patient has had severe facial pain.  She was referred to palliative care to help address goals and manage ongoing symptoms.   Observations/Objective: Patient was hospitalized 11/25/2021-11/28/2021 with sepsis from UTI.  I spoke with patient and her caregiver, [name].  Patient reports that she is doing better.  She denies any significant changes or concerns today.  She was able to get her pain medications and says the pain is stable.  Patient continues to decline repeat MRI or further cancer care.  I again recommended hospice involvement. [name] is going to work in 2 weeks and will not be home to care for patient.  They are in agreement with starting hospice care then.  We will go ahead and proceed with order.  Patient does endorse some left proximal arm/elbow swelling since discharging home from the hospital.  No pain or redness reported.  She says that the swelling is going down.  We discussed option of getting ultrasound but patient declined this.  Assessment and Plan: Maxillary cancer -overall declining performance status.  She wants to hold off on further imaging for now.  Patient now in agreement with hospice starting in about 2 weeks when  caregiver begins a new job.  Neoplasm related pain -continue fentanyl/oxycodone/gabapentin.  Continue Trileptal.  Follow Up Instructions: Follow-up MyChart visit 2-3 weeks   I discussed the assessment and treatment plan with the patient. The patient was provided an opportunity to ask questions and all were answered. The patient agreed with the plan and demonstrated an understanding of the instructions.   The patient was advised to call back or seek an in-person evaluation if the symptoms worsen or if the condition fails to improve as anticipated.  I provided 5 minutes of non-face-to-face time during this encounter.   Irean Hong, NP

## 2021-12-31 DEATH — deceased

## 2022-03-25 ENCOUNTER — Encounter: Payer: Self-pay | Admitting: Oncology

## 2022-09-02 LAB — HISTOPLASMA GAL'MANNAN AG SER: Histoplasma Gal'mannan Ag Ser: <0.5 (ref <0.5)
